# Patient Record
Sex: Female | Born: 1940 | Race: White | Hispanic: No | Smoking: Former smoker
Health system: Southern US, Community
[De-identification: ages and names within clinical notes are randomized; demographics above are authoritative.]

## PROBLEM LIST (undated history)

## (undated) DIAGNOSIS — I219 Acute myocardial infarction, unspecified: Secondary | ICD-10-CM

## (undated) DIAGNOSIS — E039 Hypothyroidism, unspecified: Secondary | ICD-10-CM

## (undated) DIAGNOSIS — J189 Pneumonia, unspecified organism: Secondary | ICD-10-CM

## (undated) DIAGNOSIS — I469 Cardiac arrest, cause unspecified: Secondary | ICD-10-CM

## (undated) DIAGNOSIS — J449 Chronic obstructive pulmonary disease, unspecified: Secondary | ICD-10-CM

## (undated) DIAGNOSIS — E119 Type 2 diabetes mellitus without complications: Secondary | ICD-10-CM

## (undated) DIAGNOSIS — K219 Gastro-esophageal reflux disease without esophagitis: Secondary | ICD-10-CM

## (undated) DIAGNOSIS — Z9981 Dependence on supplemental oxygen: Secondary | ICD-10-CM

## (undated) DIAGNOSIS — R0602 Shortness of breath: Secondary | ICD-10-CM

## (undated) DIAGNOSIS — K529 Noninfective gastroenteritis and colitis, unspecified: Secondary | ICD-10-CM

## (undated) DIAGNOSIS — I82409 Acute embolism and thrombosis of unspecified deep veins of unspecified lower extremity: Secondary | ICD-10-CM

## (undated) DIAGNOSIS — D649 Anemia, unspecified: Secondary | ICD-10-CM

## (undated) DIAGNOSIS — E7800 Pure hypercholesterolemia, unspecified: Secondary | ICD-10-CM

## (undated) DIAGNOSIS — I739 Peripheral vascular disease, unspecified: Secondary | ICD-10-CM

## (undated) DIAGNOSIS — I2510 Atherosclerotic heart disease of native coronary artery without angina pectoris: Secondary | ICD-10-CM

## (undated) DIAGNOSIS — I509 Heart failure, unspecified: Secondary | ICD-10-CM

## (undated) DIAGNOSIS — Z86711 Personal history of pulmonary embolism: Secondary | ICD-10-CM

## (undated) DIAGNOSIS — I70229 Atherosclerosis of native arteries of extremities with rest pain, unspecified extremity: Secondary | ICD-10-CM

## (undated) DIAGNOSIS — Z9289 Personal history of other medical treatment: Secondary | ICD-10-CM

## (undated) DIAGNOSIS — I10 Essential (primary) hypertension: Secondary | ICD-10-CM

## (undated) DIAGNOSIS — K922 Gastrointestinal hemorrhage, unspecified: Secondary | ICD-10-CM

## (undated) DIAGNOSIS — I998 Other disorder of circulatory system: Secondary | ICD-10-CM

## (undated) DIAGNOSIS — Z992 Dependence on renal dialysis: Secondary | ICD-10-CM

## (undated) DIAGNOSIS — N186 End stage renal disease: Secondary | ICD-10-CM

## (undated) HISTORY — DX: Hypothyroidism, unspecified: E03.9

## (undated) HISTORY — DX: Personal history of pulmonary embolism: Z86.711

## (undated) HISTORY — DX: Atherosclerosis of native arteries of extremities with rest pain, unspecified extremity: I70.229

## (undated) HISTORY — DX: Other disorder of circulatory system: I99.8

## (undated) HISTORY — DX: Noninfective gastroenteritis and colitis, unspecified: K52.9

## (undated) HISTORY — PX: TOE AMPUTATION: SHX809

## (undated) HISTORY — PX: AV FISTULA PLACEMENT: SHX1204

## (undated) HISTORY — PX: TUBAL LIGATION: SHX77

## (undated) HISTORY — PX: TONSILLECTOMY: SUR1361

## (undated) HISTORY — PX: INSERTION OF DIALYSIS CATHETER: SHX1324

---

## 2006-11-03 DIAGNOSIS — I82409 Acute embolism and thrombosis of unspecified deep veins of unspecified lower extremity: Secondary | ICD-10-CM

## 2006-11-03 HISTORY — DX: Acute embolism and thrombosis of unspecified deep veins of unspecified lower extremity: I82.409

## 2007-07-22 ENCOUNTER — Inpatient Hospital Stay (HOSPITAL_COMMUNITY): Admission: EM | Admit: 2007-07-22 | Discharge: 2007-07-31 | Payer: Self-pay | Admitting: Emergency Medicine

## 2007-07-26 ENCOUNTER — Ambulatory Visit: Payer: Self-pay | Admitting: *Deleted

## 2011-03-18 NOTE — Op Note (Signed)
[name] [name], [name] [name]                   ACCOUNT NO.:  [id number]   MEDICAL RECORD NO.:  [id number]          PATIENT TYPE:  INP   LOCATION:  6707                         FACILITY:  [facility]   PHYSICIAN:  Juleen China IV, MDDATE OF BIRTH:  1941/10/20   DATE OF PROCEDURE:  07/29/2007  DATE OF DISCHARGE:                               OPERATIVE REPORT   SURGEON:  1. Charlena Cross, M.D.   PREOPERATIVE DIAGNOSIS:  Right leg wound.   POSTOPERATIVE DIAGNOSIS:  Right leg wound.   PROCEDURES PERFORMED:  1. Abdominal aortogram.  2. Bilateral lower extremity runoff.  3. Catheter in aorta x1.   INDICATIONS FOR PROCEDURE:  This is a 70 year old female who has  recently undergone right foot amputation.  She comes in with diminished  ankle/brachial indices.  She is to be evaluated for issues, set up for  an arteriogram with the possibility of an intervention.   PROCEDURE:  After obtaining informed consent, the patient was taken to  room 7.  She is placed supine on the table.  Bilateral groins are  prepped and draped in a standard, sterile fashion.  The left groin was  anesthetized with 1% lidocaine.  Using ultrasound, the left common  femoral artery was accessed with a micropuncture needle.  An 0.018 wire  was advanced in a retrograde fashion into the abdominal aorta under  fluoroscopic visualization.  Next, a micropuncture sheath was placed.  The 0.018 wire and introducer were removed and an 0.035 Wholey wire was  advanced into the abdominal aorta under fluoroscopic visualization.  Next, a 5-French sheath was placed.  Over the wire, an Omni-Flush  catheter was placed to the level of L1 and then an abdominal aortogram  was obtained.  Next, the catheter was pulled down to the aortic  bifurcation and bilateral lower extremity runoff was obtained.   FINDINGS:  Aortogram:  The visualized portions of the suprarenal  abdominal aorta show minimal disease.  There is approximately 40%  stenosis at  the origin of the left renal artery.  The right renal artery  is occluded 1 cm distal to its origin.  The splenic artery is calcified.  The hepatic artery is visualized and is patent, as is the superior  mesenteric artery.  The infrarenal abdominal aorta is irregular.  There  is significant calcification.  It is diffusely disease.  The bilateral  common iliac arteries are also diffusely diseased. There is a high grade  stenosis within the distal right common iliac artery as well as the mid  portion of the left common iliac artery.   Right lower extremity:  The external iliac is diffusely diseased with  long segment stenosis.  The right hypogastric artery is patent.  The  right common femoral artery is patent with no hemodynamically  significant stenoses.  The right superficial femoral artery is patent  but very diminutive in size.  There are areas of stenosis within the  adductor hiatus.  The popliteal artery is diminutive in size but patent.  The patient has an anterior tibial and perineal artery which are patent  down to the level of the ankle.  The posterior tibial reconstitutes its  diminutive vessel at the level of the ankle and crosses the ankle along  with the anterior tibial.   Left lower extremity:  The left external iliac is highly diseased.  There is an area of high grade stenosis in its proximal extent.  The  profunda femoris artery has multiple branches and is patent.  The  superficial femoral artery is occluded with reconstitution into the  distal superficial femoral artery.  The popliteal artery is patent down  across the knee.  The anterior tibial and perineal artery provide the  dominant runoff to the foot.  At the level of the ankle, there is  minimal contrast visualized, therefore, accurate determination of the  disease here is not possible.   At this point in time, the diameter of the right external iliac artery  was approximately 3.5 mm.  At this time, I did not  feel a percutaneous  intervention would provide a durable result and, therefore, elected to  terminate the procedure and discussed with the patient her options of  open surgery versus percutaneous procedure with the possibility that  this may not be very durable.   Sheath was then removed and pressure was held until hemostasis was  achieved.   IMPRESSION:  1. Occluded right renal artery.  2. Diffusely diseased infrarenal abdominal aorta.  3. 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  The diameter      of the external iliac artery measured 3.5 mm.  4. Diffuse disease within the right superficial femoral artery.  The      anterior tibial and perineal artery are the dominant runoff      vessels.  5. High grade left common iliac  and external iliac stenoses.  6. Occluded left superficial femoral artery.  7. Anterior tibial and perineal are dominant runoff vessels to the      left leg.  The left foot is poorly visualized.      Jorge Ny, MD  Electronically Signed     VWB/MEDQ  D:  07/29/2007  T:  07/30/2007  Job:  [id number]

## 2011-03-18 NOTE — Assessment & Plan Note (Signed)
OFFICE VISIT   [name] [name], HADLEY  DOB:  1940/12/15                                       11/18/2007  EAVWU#:[id number]   I spoke with the patient by phone today to again stress to her that she  needs to follow up with me regarding her peripheral vascular disease and  right foot problems.   I stressed to her that she does have an appointment to see me on November 25, 2007.  I have asked the office to call her again just to verify this  appointment and stress with her to make sure that she keeps it.   Balinda Quails, M.D.  Electronically Signed   PGH/MEDQ  D:  11/18/2007  T:  11/19/2007  Job:  [id number]

## 2011-03-18 NOTE — H&P (Signed)
NAMEDANELIA, SNODGRASS NO.:  [id number]   MEDICAL RECORD NO.:  [id number]          PATIENT TYPE:  EMS   LOCATION:  MAJO                         FACILITY:  [facility]   PHYSICIAN:  Altha Harm, MDDATE OF BIRTH:  Feb 09, 1941   DATE OF ADMISSION:  07/22/2007  DATE OF DISCHARGE:                              HISTORY & PHYSICAL   CHIEF COMPLAINT:  Draining wound on right lower extremity.   HISTORY OF PRESENT ILLNESS:  This is a 70 year old lady with a history  of diabetes type 2 who approximately 3 months had an attack of gout  which was treated by her primary care physician.  According to the  patient, approximately 1 week after the gout was treated she noticed the  appearance of a wound on the right lower extremity at the fifth digit.  She stated that she cleaned the wound with peroxide, and approximately 2  months ago the wound began draining purulent contents.  The patient had  been living with her son and did not seek any medical attention  regarding the wound.  However, she came to stay with her daughter in  [place], and upon her daughter seeing the wound, she made an  appointment with her primary care physician who then referred the  patient to podiatrist.  Podiatry called this morning saying that the  patient definitely had osteomyelitis and that there had been erosion of  the metatarsophalangeal area of the right fifth digit.  Given the  patient's history of diabetes and the condition of the wound, she is  admitted for treatment of osteomyelitis.  The patient denies any fever  or chills.  She denies any nausea, vomiting, or diarrhea, and she denies  any trauma to the foot which may have precipitated the appearance of  this wound.   PAST MEDICAL HISTORY:  Significant for:  1. Diabetes type 2.  2. Hypertension.  3. Gout.  4. Hypothyroidism.   FAMILY HISTORY:  Significant for diabetes.   SOCIAL HISTORY:  The patient lives in [place].  Her son  resides  with her.  She smokes a half a pack per day x44 years of tobacco.  She  denies any alcohol or drug use.   CURRENT MEDICATIONS:  Include the following:  1. Plavix 75 mg p.o. daily.  2. Clonidine 0.3 mg p.o. t.i.d.  3. Toprol-XL 100 mg p.o. q.h.s.  4. Lipitor 40 mg p.o. q.h.s.  5. Azor 5/20 mg p.o. daily.  6. Levothyroxine 25 mcg p.o. daily.  7. Metformin 1000 mg p.o. b.i.d.  8. Amaryl 2 mg p.o. daily.   Please note the patient has also been prescribed aldactone 50/50 one-  half tab on a daily basis; however, she states she has not been taking  that in the past week.   REVIEW OF SYSTEMS:  Twelve systems are reviewed, all systems are  negative except as noted in the HPI.   PHYSICAL EXAMINATION:  VITAL SIGNS:  On my examination, the patient has  a blood pressure of 164/80, a temperature orally is 98.1, heart rate of  100,  respiratory rate of 16, sats of 97% on room air.  GENERAL:  The patient is well appearing, resting comfortably with no  acute distress.  HEENT EXAMINATION:  She is normocephalic, atraumatic.  Pupils are  equally round and reactive to light and accommodation.  Fundi are  benign.  Extraocular movements are intact.  There is no icterus or  conjunctival pallor.  Oropharynx is moist.  No exudate, erythema,  lesions noted.  Patient is edentulous.  NECK EXAMINATION:  Trachea is midline.  No masses, no thyromegaly, no  JVD, no carotid bruit.  RESPIRATORY EXAMINATION:  The patient has normal respiratory effort,  equal excursion bilaterally.  She has got no wheezing or rhonchi noted,  and she is to clear to auscultation.  There is no increased vocal  fremitus.  CARDIOVASCULAR:  She has got a normal S1 and S2.  There are no murmurs,  rubs, or gallops noted.  PMI is nondisplaced.  No heaves or thrills on  palpation.  ABDOMINAL EXAMINATION:  Abdomen is obese, soft and nontender,  nondistended.  No masses, no hepatosplenomegaly is noted.  LYMPH NODES:  She has got  no cervical, axillary, inguinal  lymphadenopathy noted.  MUSCULOSKELETAL:  Patient has no warmth, swelling, or erythema around  the joints except for the right metatarsophalangeal joint of the fifth  digit where the patient has approximately a 1 x 1.5-cm wound which  appears to be tunneling and which has drainage from it.  She has  erythema and swelling surrounding the wound.  NEUROLOGICAL:  The patient has no focal neurological deficits.  Cranial  nerves II through XII are grossly intact.  Strength is 5/5 bilaterally  upper and lower extremities.  DTRs are 2+ bilateral upper and lower  extremities.  Sensation to light touch appear intact to proprioception.  Please note that microfilament test done on the bilateral lower  extremities, she has some diminished sensation in bilateral lower  extremities on microfilament test.  PSYCHIATRIC:  She is alert and oriented x3.  She has good insight and  cognition.  She has got good recent and remote recall.   ASSESSMENT:  This is a patient who presents with:  1. Osteomyelitis of the right foot.  2. Diabetes type 2.  3. Hypertension.  4. A history of hypothyroidism.  5. A history of gout in the past.   PLAN:  The patient will be admitted.  An MRI will be done to further  evaluate the right lower extremity.  I will start the patient on  vancomycin and Zosyn and get blood cultures of the lower extremity.  At  this time, I will leave it to the discretion of the treating physician  to determine whether or not they need input from podiatry for any  further treatment once the results of the MRI are received.  The patient  will receive wound care with wet-to-dry dressings to the right lower  extremity.  The patient will be continued on her usual medication, and  we will check the patient's blood sugars a.c. and h.s., and we will also  check a hemoglobin Alc to evaluate for blood sugar control at home.      Altha Harm, MD  Electronically  Signed     MAM/MEDQ  D:  07/22/2007  T:  07/23/2007  Job:  [id number]   cc:   Lacretia Leigh. Quintella Reichert, M.D.

## 2011-03-18 NOTE — Consult Note (Signed)
NAMESAFIA, PANZER                   ACCOUNT NO.:  [id number]   MEDICAL RECORD NO.:  [id number]          PATIENT TYPE:  INP   LOCATION:  6707                         FACILITY:  [facility]   PHYSICIAN:  Balinda Quails, M.D.    DATE OF BIRTH:  November 16, 1940   DATE OF CONSULTATION:  07/26/2007  DATE OF DISCHARGE:                                 CONSULTATION   REFERRING PHYSICIAN:  Alvan Dame, D.P.M.   REASON FOR CONSULTATION:  1. Peripheral vascular disease.  2. Osteomyelitis, right 5th toe, status post right 5th toe amputation.   HISTORY:  [name] [name] is a 70 year old female who describes an attack of  gout occurring approximately 3 months ago affecting her right 5th toe.  Following the acute attack, she noticed a wound draining from the 5th  digit of her right lower extremity.  This was treated with local wound  care.  However, purulent drainage developed.  The patient was referred  to Dr. Ralene Cork, who evaluated [name] [name] with appropriate x, rays  revealing evidence of osteomyelitis.   She was admitted to [facility] on July 22, 2007 and  underwent this right 5th toe amputation and proximal metatarsal head.   Since admission to the hospital, she has undergone further workup with  lower extremity Dopplers.  These reveal ankle-brachial indices  bilaterally of 0.5.  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   PAST MEDICAL HISTORY:  1. Type 2 diabetes.  2. Tobacco abuse.  3. Hypertension.  4. Hypothyroidism.  5. Gout.  6. Hyperlipidemia.   FAMILY HISTORY:  This is significant for peripheral vascular disease and  diabetes.   SOCIAL HISTORY:  The patient lives in the [facility] and resides  with her son currently.  She is a widow.  She has 2 sons and 2  grandchildren.  She smokes a half a pack of cigarettes daily, but has  smoked more heavily in the past.  She denies any alcohol use.   MEDICATIONS:  1. Lovenox 40 mg subcu daily.  2. Protonix 40  mg p.o. daily.  3. Catapres 0.3 mg p.o. t.i.d.  4. Toprol-XL 100 mg p.o. daily.  5. Plavix 75 mg p.o. daily.  6. Lipitor 40 mg p.o. daily.  7. Synthroid 25 mcg p.o. daily.  8. Glucophage 1000 mg p.o. b.i.d.  9. Amaryl 2 mg p.o. daily.  10.Norvasc 5 mg p.o. daily.  11.Vancomycin IV per pharmacy protocol.  12.Avapro 150 mg p.o. daily.  13.Insulin sliding scale.  14.Zosyn 3.375 mg IV q.6 h.   ALLERGIES:  None known.   REVIEW OF SYSTEMS:  The patient notes bilateral claudication for  approximately 5 years.  This is primarily calf claudication.  She denies  rest pain or ischemic ulceration.  She has had no chest pain.  Mild  shortness of breath with exertion.  She denies any change in bowel  habits.  Her weight is good.  Appetite good.  No dysuria or frequency.   PHYSICAL EXAMINATION:  GENERAL:  A 70 year old female appears  approximately her stated age, alert and oriented.  VITAL SIGNS:  BP is 153/78, pulse was 86 per minute, respirations are 20  per minute, temperature 98.2 Fahrenheit.  ABDOMEN:  Soft and nontender.  No masses or organomegaly.  Normal bowel  sounds.  LOWER EXTREMITIES:  Femoral pulse 1+ on the right, absent on the left.  No popliteal, posterior tibial or dorsalis pedis pulses bilaterally.  The right foot is bandaged.   IMPRESSION:  1. Moderately severe peripheral vascular disease, bilateral lower      extremities, with multilevel vascular occlusive disease with ankle-      brachial indices 0.5.  2. History of gout with osteomyelitis of right 5th toe, status post      amputation.  3. Type 2 diabetes.  4. Hypertension.  5. Hyperlipidemia.  6. Hypothyroidism.  7. Gout.   RECOMMENDATIONS:  The patient will be scheduled for a diagnostic  arteriogram of the lower extremities with possible intervention for  treatment of advanced peripheral vascular disease.      Balinda Quails, M.D.  Electronically Signed     PGH/MEDQ  D:  07/26/2007  T:  07/27/2007  Job:   [id number]

## 2011-03-18 NOTE — Op Note (Signed)
NAMECLAUDENE, [name] [name]                   ACCOUNT NO.:  [id number]   MEDICAL RECORD NO.:  [id number]          PATIENT TYPE:  INP   LOCATION:  6707                         FACILITY:  [facility]   PHYSICIAN:  Alvan Dame, D.P.M. DATE OF BIRTH:  [birth year]   DATE OF PROCEDURE:  07/24/2007  DATE OF DISCHARGE:                               OPERATIVE REPORT   SURGEON:  Alvan Dame, D.P.M.   PREOPERATIVE DIAGNOSIS:  Ulcer with osteomyelitis, 5th toe proximal  phalanx and distal metatarsal head, right foot.   POSTOPERATIVE DIAGNOSIS:  Ulcer with osteomyelitis, 5th toe proximal  phalanx and distal metatarsal head, right foot.   OPERATIVE PROCEDURE:  Amputation of 5th toe and proximal metatarsal  head, 5th, right.   ANESTHESIA:  MAC or managed anesthesia care, with local anesthetic  administered, a total of 15 mL 2% Xylocaine plain and 15 mL 0.5%  Marcaine plain in a PT and ankle block fashion.   HEMOSTASIS:  Use of right ankle tourniquet at 250 mmHg.   INDICATIONS FOR SURGERY:  The patient has approximately a 74-month  history of ulceration.  X-rays and MRI have confirmed osteomyelitis  extending to the metatarsal head, as well as proximal phalanx of  5th  digit.  The patient does have severe diabetes with completing factors of  angiopathy and neuropathy, based on history.  The patient is currently  on antibiotics; however, appropriate debridement and amputation of  infected bone are recommended at this time.  There are no  contraindications, so we proceeded as scheduled.   FINDINGS AND PROCEDURE:  The patient was brought into the OR and placed  on the table in the supine position.  IV sedation was established.  A  local anesthetic was administered.  The foot was prepped and draped in  the usual aseptic manner and the following procedures were then carried  out.   AMPUTATION OF 5TH TOE AND DISTAL 5TH METATARSAL, RIGHT FOOT:  Attention  was now directed to the dorsolateral aspect of the  right foot, where  approximately a 1-cm ulceration was identified over the lateral 5th  metatarsal.  Bone was exposed within the ulcer site, and at this time,  utilizing a tennis-racket like incision, incision was made over the  dorsolateral surface of the 5th metatarsal, extending from the base of  the 5th metatarsal distal to the base of the 5th toe.  The 5th toe was  incised circumferentially.  At this time, dissection was deepened, with  hemostasis being acquired as necessary, vessels being ligated and  careful dissection of the capsule of the 5th MTP joint, which was kept  intact.  At this time, utilizing power instrumentation, a window at the  midshaft of the 5th metatarsal was made and a through-and-through  osteotomy was made from dorsal proximal to plantar distal in  orientation, and the distal aspect of the metatarsal, as well as the  entire 5th digit were resected from the site in toto and submitted for  pathology.  At this time, the remaining site was cultured for both  aerobic and anaerobic growth, and the tissue  submitted for pathology  analysis.  At this time, using power and pulsed lavage, 1000 mL of  saline was utilized to cleanse the area thoroughly.  Additional cautery  was performed for any small remaining bleeders.  At this time, the  capsule over the 5th metatarsal stump site was closed with 3-0 Vicryl,  and a combination of 4-0 nylon and 2-0 nylon were utilized to close the  skin over a TLS drain.  The TLS drain was noted to be active and  functioning upon closure.  At this time, Betadine, Adaptic and a dry  sterile compressive dressing were applied to the right foot, and a TLS  drain was activated.   On completion of the procedure, the tourniquet was deflated with  immediate return of perfusion to all digits being noted.  I should note,  the digits are somewhat rubrous.  This is consistent with her  preoperative as well, with diabetic vascular disease being noted.   At  this time, the patient returned to recovery and will be discharged to  her room per Anesthesia.  Return to all preoperative medications.  I  will prescribe Vicodin for pain.  The patient will be nonweightbearing  for a 24-hour period.  The drain will be left in for at leas 24 to 72  hours.  Minimal bleeding was identified and the drain was functioning,  although at a very slow rate.  I should note from a vascular standpoint,  the patient does have some likely compromise.  This patient will be  followed appropriately for at least the next several days until hopeful  release to home postop.  Still pending are her cultures and vascular  consult.           ______________________________  Alvan Dame, D.P.M.     RS/MEDQ  D:  07/24/2007  T:  07/24/2007  Job:  [id number]

## 2011-03-18 NOTE — Discharge Summary (Signed)
[name] [name], [name] [name]                   ACCOUNT NO.:  [id number]   MEDICAL RECORD NO.:  [id number]          PATIENT TYPE:  INP   LOCATION:  6707                         FACILITY:  [facility]   PHYSICIAN:  Lonia Blood, M.D.      DATE OF BIRTH:  [birth year]   DATE OF ADMISSION:  07/22/2007  DATE OF DISCHARGE:  07/31/2007                               DISCHARGE SUMMARY   ADDENDUM:   PRIMARY CARE PHYSICIAN:  Dr. Quintella Reichert.   VASCULAR SURGERY:  Dr. Liliane Bade.   PODIATRY:  Dr. Alvan Dame, D.P.M.   Please refer to dictated discharge summary by Dr. Marcellus Scott on  September 23.   ADDITIONAL PROCEDURES PERFORMED:  Include abdominal aortogram, bilateral  lower extremity runoff and aortic catheterization by vascular surgery.  Findings showed occluded right renal artery, diffusely diseased  infrarenal abdominal aorta, high-grade stenosis within the right common  iliac artery and diffuse disease in the right axillary iliac artery with  the diameter of the right axillary iliac artery measured at 3.5 mm.  Also, diffuse disease within the right superficial femoral artery,  anterior tibial and peroneal artery of the dominant runoff, high-grade  left common iliac and axial iliac stenosis, occluded left superficial  femoral artery, anterior tibial and perineal dominant runoff vessels in  the left leg, with poorly visualized left foot.  Recommendation for her  Dr. Liliane Bade.  The patient will need multiple bypass surgeries.  She  will, however, need a cardiac evaluation as an outpatient prior to the  surgery.   DISPOSITION:  The patient will be discharged home to follow up with Dr.  Liliane Bade and Dr. Quintella Reichert, her primary care physician.  She should get  some cardiac preoperative cardiac evaluation as an outpatient, prior to  her surgery.  She will also follow with Dr. Ralene Cork for her wound care.   DISCHARGE MEDICATIONS:  Include:  1. Norvasc 10 mg daily.  2. Lipitor 40 mg q.h.s.  3. Clonidine 0.3  mg t.i.d.  4. Toprol XL 150 mg daily.  5. Plavix 75 mg daily.  6. Amaryl 2 mg daily.  7. Azor 520 1 tablet daily.  8. Levothyroxine 25 mcg daily.  9. Keflex 500 mg p.o. t.i.d. for 2 weeks.  10.Ciprofloxacin 500 mg p.o. t.i.d. for 2 weeks.  The patient is      currently stable as indicated and will proceed with a discharge.      Lonia Blood, M.D.  Electronically Signed     LG/MEDQ  D:  07/31/2007  T:  08/01/2007  Job:  [id number]   cc:   Quentin Mulling, MD  Alvan Dame, D.P.M.  Balinda Quails, M.D.

## 2011-03-18 NOTE — Discharge Summary (Signed)
[name], [name]                   ACCOUNT NO.:  [id number]   MEDICAL RECORD NO.:  [id number]          PATIENT TYPE:  INP   LOCATION:  6707                         FACILITY:  [facility]   PHYSICIAN:  Marcellus Scott, MD     DATE OF BIRTH:  1941-04-14   DATE OF ADMISSION:  07/22/2007  DATE OF DISCHARGE:                               DISCHARGE SUMMARY   INTERIM DISCHARGE SUMMARY:   DATE OF DISCHARGE:  To be determined.   PRIMARY MEDICAL DOCTOR:  Dr. Quintella Reichert.   DISCHARGE DIAGNOSES:  1. 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  2. Severe peripheral vascular disease.  3. Diarrhea.  4. Hypertension.  5. Diabetes.  6. Anemia.  7. Hypothyroidism.   DISCHARGE MEDICATIONS:  To be dictated at discharge.   PROCEDURES:  1. X-ray of the right foot on September 20.  Impression:  Unremarkable      postoperative changes.  No acute bony abnormality.  2. MRI of the right foot with and without contrast on September 18.      Impression:  (1) osteomyelitis of the distal fifth metatarsal and      of the base of the proximal phalanx of the fifth toe.  There is      surrounding soft tissue edema and enhancement, extending in the      adjacent plantar interosseous muscle in the flexor opponens digiti      minimi muscles.  (2) Low-level edema in the sesamoid bones of the      great toe, compatible with mild sesamoiditis.   PERTINENT LAB RESULTS:  Wound culture taken intraop:  No growth.  Hemoglobin A1c of 6.  Rest of the labs to be dictated at discharge.   CONSULTATIONS:  1. Podiatry, Alvan Dame, D.P.M.  2. VVS from P. Liliane Bade, M.D.   HOSPITAL COURSE AND PATIENT DISPOSITION:  For details of initial  admission, please refer to the history and physical note.  In summary,  [name] is a pleasant 70 year old female patient with history of type  2 diabetes, hypertension, hyperlipidemia, who noted a wound on the right  foot on the lateral  aspect of the little toe weeks ago, which she had  been managing at home with cleaning with hydrogen peroxide.  Approximately 2 months ago the wound started draining. She did not seek  any medical attention.  The patient, however, went to see the podiatrist  on the day of admission, who suspected osteomyelitis and requested  admission to the hospital for IV antibiotics and surgical intervention.  The patient was admitted to the medical floor directly.  Wound cultures  were already taken off and sent from the podiatrist's office.  The  patient was placed on empiric IV Zosyn and vancomycin, and she was  continued on her home hypoglycemic and antihypertensive agents.  Arterial Dopplers of the lower extremities were done because of very  poor pulsations or absent pulsations in both dorsalis pedis and  posterior tibials.  The patient was subsequently taken to the  operating  room and had amputation of the right fifth metatarsal and proximal  phalanx.  The patient tolerated the procedure well.  Subsequently a  vascular consult has been done to assess the arterial supply in the  lower extremity because of the risk of poor healing with lack of  vascular supply.  The patient is assessed to have moderately severe  peripheral vascular disease, bilateral lower extremities, with  multilevel vascular occlusive disease. The patient is scheduled for an  arteriogram plus or minus PTA/stent for September 24.  Further course  will be dictated based on the outcome of that procedure.  The patient  subsequently may go home on oral antibiotics depending on the culture  results from the podiatrist's office.      Marcellus Scott, MD  Electronically Signed     AH/MEDQ  D:  07/27/2007  T:  07/28/2007  Job:  [id number]   cc:   Lacretia Leigh. Quintella Reichert, M.D.  Alvan Dame, D.P.M.  Balinda Quails, M.D.

## 2011-06-12 DIAGNOSIS — I739 Peripheral vascular disease, unspecified: Secondary | ICD-10-CM

## 2011-06-12 DIAGNOSIS — E039 Hypothyroidism, unspecified: Secondary | ICD-10-CM

## 2011-06-12 DIAGNOSIS — E7800 Pure hypercholesterolemia, unspecified: Secondary | ICD-10-CM

## 2011-06-12 DIAGNOSIS — B351 Tinea unguium: Secondary | ICD-10-CM

## 2011-08-14 LAB — CBC
HCT: 32.8 — ABNORMAL LOW
HCT: 35.7 — ABNORMAL LOW
Hemoglobin: 11.7 — ABNORMAL LOW
Hemoglobin: 14.9
MCHC: 32.9
MCV: 81.1
Platelets: 319
Platelets: 335
RBC: 4.42
RDW: 13.7
RDW: 15.1 — ABNORMAL HIGH
RDW: 15.2 — ABNORMAL HIGH
WBC: 8.3

## 2011-08-14 LAB — WOUND CULTURE

## 2011-08-14 LAB — DIFFERENTIAL
Basophils Absolute: 0.1
Lymphocytes Relative: 13
Lymphs Abs: 1.1
Monocytes Absolute: 0.5
Neutro Abs: 6.5

## 2011-08-14 LAB — CLOSTRIDIUM DIFFICILE EIA: C difficile Toxins A+B, EIA: NEGATIVE

## 2011-08-14 LAB — BASIC METABOLIC PANEL
BUN: 12
BUN: 9
CO2: 26
Calcium: 8.7
Calcium: 9.4
Chloride: 97
Chloride: 99
Creatinine, Ser: 1.04
GFR calc non Af Amer: 53 — ABNORMAL LOW
GFR calc non Af Amer: >60
Glucose, Bld: 100 — ABNORMAL HIGH
Glucose, Bld: 129 — ABNORMAL HIGH
Potassium: 3.9
Sodium: 139

## 2011-08-14 LAB — CULTURE, BLOOD (ROUTINE X 2)
Culture: NO GROWTH
Culture: NO GROWTH

## 2011-08-14 LAB — ANAEROBIC CULTURE

## 2011-08-14 LAB — APTT: aPTT: 33

## 2011-10-04 DIAGNOSIS — K529 Noninfective gastroenteritis and colitis, unspecified: Secondary | ICD-10-CM

## 2011-10-04 HISTORY — DX: Noninfective gastroenteritis and colitis, unspecified: K52.9

## 2011-10-11 ENCOUNTER — Inpatient Hospital Stay (HOSPITAL_COMMUNITY): Payer: Medicare Other

## 2011-10-11 ENCOUNTER — Inpatient Hospital Stay (HOSPITAL_COMMUNITY)
Admission: AD | Admit: 2011-10-11 | Discharge: 2011-11-01 | DRG: 673 | Disposition: A | Discharge status: Skilled Nursing Facility | Payer: Medicare Other | Source: Other Acute Inpatient Hospital | Attending: Internal Medicine | Admitting: Internal Medicine

## 2011-10-11 ENCOUNTER — Encounter (HOSPITAL_COMMUNITY): Payer: Self-pay | Admitting: *Deleted

## 2011-10-11 DIAGNOSIS — D631 Anemia in chronic kidney disease: Secondary | ICD-10-CM | POA: Diagnosis present

## 2011-10-11 DIAGNOSIS — E876 Hypokalemia: Secondary | ICD-10-CM | POA: Diagnosis not present

## 2011-10-11 DIAGNOSIS — I120 Hypertensive chronic kidney disease with stage 5 chronic kidney disease or end stage renal disease: Principal | ICD-10-CM | POA: Diagnosis present

## 2011-10-11 DIAGNOSIS — N2581 Secondary hyperparathyroidism of renal origin: Secondary | ICD-10-CM | POA: Diagnosis present

## 2011-10-11 DIAGNOSIS — E039 Hypothyroidism, unspecified: Secondary | ICD-10-CM | POA: Diagnosis present

## 2011-10-11 DIAGNOSIS — E119 Type 2 diabetes mellitus without complications: Secondary | ICD-10-CM | POA: Diagnosis present

## 2011-10-11 DIAGNOSIS — Z794 Long term (current) use of insulin: Secondary | ICD-10-CM

## 2011-10-11 DIAGNOSIS — R5381 Other malaise: Secondary | ICD-10-CM | POA: Diagnosis not present

## 2011-10-11 DIAGNOSIS — Z79899 Other long term (current) drug therapy: Secondary | ICD-10-CM

## 2011-10-11 DIAGNOSIS — J810 Acute pulmonary edema: Secondary | ICD-10-CM | POA: Diagnosis present

## 2011-10-11 DIAGNOSIS — A0472 Enterocolitis due to Clostridium difficile, not specified as recurrent: Secondary | ICD-10-CM | POA: Diagnosis not present

## 2011-10-11 DIAGNOSIS — E7800 Pure hypercholesterolemia, unspecified: Secondary | ICD-10-CM | POA: Diagnosis present

## 2011-10-11 DIAGNOSIS — J90 Pleural effusion, not elsewhere classified: Secondary | ICD-10-CM | POA: Diagnosis present

## 2011-10-11 DIAGNOSIS — I10 Essential (primary) hypertension: Secondary | ICD-10-CM | POA: Diagnosis present

## 2011-10-11 DIAGNOSIS — J9600 Acute respiratory failure, unspecified whether with hypoxia or hypercapnia: Secondary | ICD-10-CM | POA: Diagnosis present

## 2011-10-11 DIAGNOSIS — J9819 Other pulmonary collapse: Secondary | ICD-10-CM | POA: Diagnosis present

## 2011-10-11 DIAGNOSIS — E875 Hyperkalemia: Secondary | ICD-10-CM | POA: Diagnosis present

## 2011-10-11 DIAGNOSIS — N185 Chronic kidney disease, stage 5: Secondary | ICD-10-CM

## 2011-10-11 DIAGNOSIS — N179 Acute kidney failure, unspecified: Secondary | ICD-10-CM | POA: Diagnosis present

## 2011-10-11 DIAGNOSIS — I824Z9 Acute embolism and thrombosis of unspecified deep veins of unspecified distal lower extremity: Secondary | ICD-10-CM | POA: Diagnosis not present

## 2011-10-11 DIAGNOSIS — E669 Obesity, unspecified: Secondary | ICD-10-CM | POA: Diagnosis present

## 2011-10-11 DIAGNOSIS — Z7982 Long term (current) use of aspirin: Secondary | ICD-10-CM

## 2011-10-11 DIAGNOSIS — R748 Abnormal levels of other serum enzymes: Secondary | ICD-10-CM | POA: Diagnosis not present

## 2011-10-11 DIAGNOSIS — S98139A Complete traumatic amputation of one unspecified lesser toe, initial encounter: Secondary | ICD-10-CM

## 2011-10-11 DIAGNOSIS — K219 Gastro-esophageal reflux disease without esophagitis: Secondary | ICD-10-CM | POA: Diagnosis present

## 2011-10-11 DIAGNOSIS — I739 Peripheral vascular disease, unspecified: Secondary | ICD-10-CM | POA: Diagnosis present

## 2011-10-11 DIAGNOSIS — D649 Anemia, unspecified: Secondary | ICD-10-CM | POA: Diagnosis present

## 2011-10-11 DIAGNOSIS — A498 Other bacterial infections of unspecified site: Secondary | ICD-10-CM | POA: Diagnosis present

## 2011-10-11 DIAGNOSIS — N186 End stage renal disease: Secondary | ICD-10-CM | POA: Diagnosis not present

## 2011-10-11 DIAGNOSIS — N390 Urinary tract infection, site not specified: Secondary | ICD-10-CM | POA: Diagnosis present

## 2011-10-11 DIAGNOSIS — J441 Chronic obstructive pulmonary disease with (acute) exacerbation: Secondary | ICD-10-CM | POA: Diagnosis present

## 2011-10-11 DIAGNOSIS — Z87891 Personal history of nicotine dependence: Secondary | ICD-10-CM

## 2011-10-11 DIAGNOSIS — I2699 Other pulmonary embolism without acute cor pulmonale: Secondary | ICD-10-CM | POA: Diagnosis not present

## 2011-10-11 HISTORY — DX: Pneumonia, unspecified organism: J18.9

## 2011-10-11 HISTORY — DX: Atherosclerotic heart disease of native coronary artery without angina pectoris: I25.10

## 2011-10-11 HISTORY — DX: Chronic obstructive pulmonary disease, unspecified: J44.9

## 2011-10-11 HISTORY — DX: Peripheral vascular disease, unspecified: I73.9

## 2011-10-11 HISTORY — DX: Acute myocardial infarction, unspecified: I21.9

## 2011-10-11 HISTORY — DX: Gastro-esophageal reflux disease without esophagitis: K21.9

## 2011-10-11 HISTORY — DX: Essential (primary) hypertension: I10

## 2011-10-11 LAB — URINALYSIS, ROUTINE W REFLEX MICROSCOPIC
Bilirubin Urine: NEGATIVE
Ketones, ur: NEGATIVE mg/dL
Nitrite: NEGATIVE
Protein, ur: NEGATIVE mg/dL

## 2011-10-11 LAB — GLUCOSE, CAPILLARY: Glucose-Capillary: 426 mg/dL — ABNORMAL HIGH (ref 70–99)

## 2011-10-11 LAB — MRSA PCR SCREENING: MRSA by PCR: NEGATIVE

## 2011-10-11 LAB — URINE MICROSCOPIC-ADD ON

## 2011-10-11 MED ORDER — ONDANSETRON HCL 4 MG PO TABS
4.0000 mg | ORAL_TABLET | Freq: Four times a day (QID) | ORAL | Status: DC | PRN
  Filled 2011-10-11: qty 1

## 2011-10-11 MED ORDER — ONDANSETRON HCL 4 MG/2ML IJ SOLN
4.0000 mg | Freq: Four times a day (QID) | INTRAMUSCULAR | Status: DC | PRN
  Administered 2011-10-12 – 2011-10-26 (×4): 4 mg via INTRAVENOUS
  Filled 2011-10-11 (×4): qty 2

## 2011-10-11 MED ORDER — INSULIN GLARGINE 100 UNIT/ML ~~LOC~~ SOLN
30.0000 [IU] | SUBCUTANEOUS | Status: DC
  Administered 2011-10-12 – 2011-10-13 (×2): 30 [IU] via SUBCUTANEOUS
  Filled 2011-10-11: qty 3

## 2011-10-11 MED ORDER — OXYCODONE HCL 5 MG PO TABS
5.0000 mg | ORAL_TABLET | ORAL | Status: DC | PRN
  Administered 2011-10-19 – 2011-10-23 (×2): 5 mg via ORAL
  Filled 2011-10-11 (×3): qty 1

## 2011-10-11 MED ORDER — ACETAMINOPHEN 325 MG PO TABS
650.0000 mg | ORAL_TABLET | Freq: Four times a day (QID) | ORAL | Status: DC | PRN
  Administered 2011-11-01: 650 mg via ORAL

## 2011-10-11 MED ORDER — AMLODIPINE BESYLATE 10 MG PO TABS
10.0000 mg | ORAL_TABLET | Freq: Every day | ORAL | Status: DC
  Filled 2011-10-11 (×2): qty 1

## 2011-10-11 MED ORDER — CLOPIDOGREL BISULFATE 75 MG PO TABS
75.0000 mg | ORAL_TABLET | Freq: Every day | ORAL | Status: DC
  Administered 2011-10-12 – 2011-11-01 (×18): 75 mg via ORAL
  Filled 2011-10-11 (×24): qty 1

## 2011-10-11 MED ORDER — ISOSORBIDE MONONITRATE ER 60 MG PO TB24
120.0000 mg | ORAL_TABLET | Freq: Every day | ORAL | Status: DC
  Administered 2011-10-12 – 2011-11-01 (×16): 120 mg via ORAL
  Filled 2011-10-11 (×21): qty 2

## 2011-10-11 MED ORDER — PREDNISONE 20 MG PO TABS
40.0000 mg | ORAL_TABLET | ORAL | Status: DC
  Administered 2011-10-12 – 2011-10-13 (×2): 40 mg via ORAL
  Filled 2011-10-11 (×3): qty 2

## 2011-10-11 MED ORDER — HYDRALAZINE HCL 50 MG PO TABS
50.0000 mg | ORAL_TABLET | Freq: Three times a day (TID) | ORAL | Status: DC
  Administered 2011-10-12 – 2011-10-20 (×14): 50 mg via ORAL
  Filled 2011-10-11 (×30): qty 1

## 2011-10-11 MED ORDER — ASPIRIN 81 MG PO CHEW
81.0000 mg | CHEWABLE_TABLET | Freq: Every day | ORAL | Status: DC
  Administered 2011-10-12 – 2011-11-01 (×16): 81 mg via ORAL
  Filled 2011-10-11 (×18): qty 1

## 2011-10-11 MED ORDER — NITROGLYCERIN 0.4 MG SL SUBL: 0.4000 mg | SUBLINGUAL_TABLET | SUBLINGUAL | Status: DC | PRN

## 2011-10-11 MED ORDER — SODIUM CHLORIDE 0.9 % IV SOLN
250.0000 mL | INTRAVENOUS | Status: DC | PRN
  Administered 2011-10-18: 500 mL via INTRAVENOUS
  Administered 2011-10-19: 250 mL via INTRAVENOUS
  Administered 2011-10-22: 10 mL via INTRAVENOUS
  Administered 2011-10-26: 250 mL via INTRAVENOUS

## 2011-10-11 MED ORDER — DOCUSATE SODIUM 100 MG PO CAPS
100.0000 mg | ORAL_CAPSULE | Freq: Two times a day (BID) | ORAL | Status: DC
  Administered 2011-10-12 – 2011-11-01 (×23): 100 mg via ORAL
  Filled 2011-10-11 (×32): qty 1

## 2011-10-11 MED ORDER — CLONIDINE HCL 0.1 MG PO TABS
0.1000 mg | ORAL_TABLET | Freq: Three times a day (TID) | ORAL | Status: DC
  Administered 2011-10-12 – 2011-10-20 (×16): 0.1 mg via ORAL
  Filled 2011-10-11 (×28): qty 1

## 2011-10-11 MED ORDER — FUROSEMIDE 10 MG/ML IJ SOLN
80.0000 mg | Freq: Once | INTRAMUSCULAR | Status: AC
  Administered 2011-10-12: 80 mg via INTRAVENOUS
  Filled 2011-10-11: qty 8

## 2011-10-11 MED ORDER — SODIUM CHLORIDE 0.9 % IJ SOLN
3.0000 mL | INTRAMUSCULAR | Status: DC | PRN
  Administered 2011-10-12: 03:00:00 via INTRAVENOUS
  Administered 2011-10-13: 3 mL via INTRAVENOUS

## 2011-10-11 MED ORDER — IPRATROPIUM BROMIDE 0.02 % IN SOLN
0.5000 mg | RESPIRATORY_TRACT | Status: DC
  Administered 2011-10-12 – 2011-10-13 (×10): 0.5 mg via RESPIRATORY_TRACT
  Filled 2011-10-11 (×10): qty 2.5

## 2011-10-11 MED ORDER — PANTOPRAZOLE SODIUM 40 MG PO TBEC
40.0000 mg | DELAYED_RELEASE_TABLET | Freq: Every day | ORAL | Status: DC
  Administered 2011-10-12 – 2011-10-14 (×3): 40 mg via ORAL
  Filled 2011-10-11 (×3): qty 1

## 2011-10-11 MED ORDER — INSULIN ASPART 100 UNIT/ML ~~LOC~~ SOLN
8.0000 [IU] | Freq: Once | SUBCUTANEOUS | Status: AC
  Administered 2011-10-12: 8 [IU] via SUBCUTANEOUS

## 2011-10-11 MED ORDER — GUAIFENESIN ER 600 MG PO TB12
600.0000 mg | ORAL_TABLET | Freq: Two times a day (BID) | ORAL | Status: DC
  Administered 2011-10-12 – 2011-11-01 (×35): 600 mg via ORAL
  Filled 2011-10-11 (×43): qty 1

## 2011-10-11 MED ORDER — METOPROLOL TARTRATE 50 MG PO TABS
50.0000 mg | ORAL_TABLET | Freq: Two times a day (BID) | ORAL | Status: DC
  Administered 2011-10-12 – 2011-10-20 (×12): 50 mg via ORAL
  Filled 2011-10-11 (×19): qty 1

## 2011-10-11 MED ORDER — LEVOTHYROXINE SODIUM 25 MCG PO TABS
25.0000 ug | ORAL_TABLET | Freq: Every day | ORAL | Status: DC
  Administered 2011-10-12 – 2011-10-14 (×3): 25 ug via ORAL
  Filled 2011-10-11 (×4): qty 1

## 2011-10-11 MED ORDER — ACETAMINOPHEN 650 MG RE SUPP
650.0000 mg | Freq: Four times a day (QID) | RECTAL | Status: DC | PRN
  Filled 2011-10-11: qty 1

## 2011-10-11 MED ORDER — INSULIN ASPART 100 UNIT/ML ~~LOC~~ SOLN
0.0000 [IU] | Freq: Three times a day (TID) | SUBCUTANEOUS | Status: DC
  Administered 2011-10-12: 15 [IU] via SUBCUTANEOUS
  Filled 2011-10-11: qty 3

## 2011-10-11 MED ORDER — SODIUM CHLORIDE 0.9 % IJ SOLN: 3.0000 mL | Freq: Two times a day (BID) | INTRAMUSCULAR | Status: DC

## 2011-10-11 MED ORDER — MOXIFLOXACIN HCL IN NACL 400 MG/250ML IV SOLN
400.0000 mg | INTRAVENOUS | Status: DC
  Administered 2011-10-12 – 2011-10-13 (×2): 400 mg via INTRAVENOUS
  Filled 2011-10-11 (×3): qty 250

## 2011-10-11 MED ORDER — SODIUM POLYSTYRENE SULFONATE 15 GM/60ML PO SUSP
30.0000 g | Freq: Once | ORAL | Status: AC
  Administered 2011-10-12: 30 g via ORAL
  Filled 2011-10-11: qty 120

## 2011-10-11 MED ORDER — FUROSEMIDE 10 MG/ML IJ SOLN
40.0000 mg | INTRAMUSCULAR | Status: DC
  Administered 2011-10-12: 40 mg via INTRAVENOUS
  Filled 2011-10-11 (×2): qty 4

## 2011-10-11 MED ORDER — SODIUM BICARBONATE 650 MG PO TABS
650.0000 mg | ORAL_TABLET | Freq: Three times a day (TID) | ORAL | Status: DC
  Administered 2011-10-12 – 2011-10-14 (×9): 650 mg via ORAL
  Filled 2011-10-11 (×10): qty 1

## 2011-10-11 MED ORDER — ALBUTEROL SULFATE (5 MG/ML) 0.5% IN NEBU
2.5000 mg | INHALATION_SOLUTION | RESPIRATORY_TRACT | Status: DC
  Administered 2011-10-12 – 2011-10-13 (×10): 2.5 mg via RESPIRATORY_TRACT
  Filled 2011-10-11 (×10): qty 0.5

## 2011-10-11 NOTE — H&P (Signed)
PCP:   Dr Alwyn Ren   Chief Complaint: weakness,shortness of breath in last 2 days.  HPI: Mrs Grunert was transferred from [place] ED today with worsening SOB. She has felt increasingly weak in the last 2 days. She was hospitalized at [facility] hospital about 2 weeks ago, for presumably PNA. She says she had fluid in one side of the lung. She apparently completed course of abx then. However, she has continued to feel unwell and weak, with some abdominal pain, SOBE. At [facility] ED patient was found to have bilateral pleural effusions with atelectasis, in setting of acute on chronic renal failure, BUN/Cr 77/4.97 today with a potassium of 6.6. Patient says she has an atrophic kidney, and has seen Dr Hyman Hopes in the past but apparently got lost to follow up. She says she has had some belly pains and had some diarrhea today. She was on cefuroxime till about 4 days ago for "PNA". She uses oxygen and nebuliser treatments at home but she has been "whistling" in the chest lately. At [facility] her ProBNP was more than 20000. She denies not recall recent 2 decho. She admits to some subjective fever. No dysuria. She was given kayexalate/insulin/d50w and referred to [facility] as she may require dialysis. I have informed Dr Arlean Hopping about the patient and he will graciously see in am.     Review of Systems:  The patient denies anorexia, fever, weight loss,, vision loss, decreased hearing, hoarseness, chest pain, syncope, dyspnea on exertion, peripheral edema, balance deficits, hemoptysis, abdominal pain, melena, hematochezia, severe indigestion/heartburn, hematuria, incontinence, genital sores, muscle weakness, suspicious skin lesions, transient blindness, difficulty walking, depression, unusual weight change, abnormal bleeding, enlarged lymph nodes, angioedema, and breast masses.  Past Medical History: Past Medical History  Diagnosis Date  . Diabetes mellitus   . Hypercholesterolemia   . Hypothyroidism   . Colitis   . Chronic  kidney disease   . Coronary artery disease   . Hypertension   . GERD (gastroesophageal reflux disease)   . Myocardial infarction   . Peripheral vascular disease   . COPD (chronic obstructive pulmonary disease)   . Pneumonia    Past Surgical History  Procedure Date  . Partial hysterectomy   . Tubal ligation   . Amputaton of right fifth toe   . Tonsillectomy     Medications: Prior to Admission medications   Medication Sig Start Date End Date Taking? Authorizing Provider  nitroGLYCERIN (NITROSTAT) 0.4 MG SL tablet Place 0.4 mg under the tongue every 5 (five) minutes as needed. For chest pain.    Yes Historical Provider, MD  AMLODIPINE BESYLATE PO Take by mouth daily.      Historical Provider, MD  Atorvastatin Calcium (LIPITOR PO) Take by mouth daily.      Historical Provider, MD  CLONIDINE HCL PO Take by mouth 3 (three) times daily.      Historical Provider, MD  Clopidogrel Bisulfate (PLAVIX PO) Take by mouth daily.      Historical Provider, MD  HYDRALAZINE HCL PO Take by mouth 2 (two) times daily.      Historical Provider, MD  Insulin Isophane Human (HUMULIN [place]) Inject into the skin 2 (two) times daily.      Historical Provider, MD  ISOSORBIDE MONONITRATE PO Take by mouth daily.      Historical Provider, MD  METOPROLOL TARTRATE PO Take by mouth 2 (two) times daily.      Historical Provider, MD  PREDNISONE PO Take by mouth daily.  Historical Provider, MD  Ranolazine (RANEXA PO) Take by mouth 2 (two) times daily.      Historical Provider, MD  SODIUM BICARBONATE PO Take by mouth 2 (two) times daily.      Historical Provider, MD    Allergies:   Allergies  Allergen Reactions  . Novocain Other (See Comments)    Unknown    Social History:  reports that she quit smoking about 4 months ago. Her smoking use included Cigarettes. She has a 40 pack-year smoking history. She does not have any smokeless tobacco history on file. She reports that she does not drink alcohol or use  illicit drugs.  Family History: No family history on file.  Physical Exam: Filed Vitals:   10/11/11 2216  BP: 157/56  Pulse: 101  Temp: 97.7 F (36.5 C)  TempSrc: Oral  Resp: 18  Height: 5\' 5"  (1.651 m)  Weight: 74.1 kg (163 lb 5.8 oz)  SpO2: 92%   General appearance: alert, appears older than stated age and mild distress Head: Normocephalic, without obvious abnormality, atraumatic Eyes: conjunctivae/corneas clear. PERRL, EOM's intact. Fundi benign. Throat: lips, mucosa, and tongue normal; teeth and gums normal Neck: no adenopathy, no carotid bruit, no JVD, supple, symmetrical, trachea midline and thyroid not enlarged, symmetric, no tenderness/mass/nodules Resp:scattered wheezing bilaterally. Basilar rhonchi, reduced air entry, especially both bases. Cardio: regular rate and rhythm, S1, S2 normal, no murmur, click, rub or gallop GI: soft, non-tender; bowel sounds normal; no masses,  no organomegaly Extremities: extremities normal, atraumatic, no cyanosis or edema Pulses: 2+ and symmetric Neurologic: Grossly normal   Labs on Admission:  Reviewed labs from [facility] Radiological Exams on Admission:- reviewed cxr results from [place].   Assessment 70 year old female with acute on chronic CKD with worsening pleural effusions/hyperkalemia. Wonder what precipitating event was-?abx/gi loss. Surprisingly has not seen renal in recent years, hence no discussion about possible dialysis till today. She has respiratory compromise, likely combination of copd/pleural effusions/atelectasis?PNA, possible cardiac element. .ARF (acute respiratory failure)- Admit sdu. Diurese/bronchodilators/O2/steroids,[place] w/u-2decho/cxr/cardiac enzymes. If no improvement, may need HD/pleural tap? Marland KitchenPleural effusion, bilateral- Likely related to advanced kidney disease. Consider pleural tap if not improving with diuresis. .Hyperkalemia-kayexalate. Recheck level, if refractory may need hd. Marland KitchenAKI (acute kidney  injury)- renal ultrasound/ random urine sodium/creatinine/ua/uc .CKD (chronic kidney disease) stage 5, GFR less than 15 ml/min- dr Arlean Hopping will see in am. .DM (diabetes mellitus)- check HbA1C. lantus/SSI .COPD with acute exacerbation- steroids/O2/Abx. Marland Kitchen anemia- likely anemia of chronic kidney disease. Anemia panel. Condition critical, time spent 65 minutes. Imya Mance,[phone] 10/11/2011, 11:09 PM

## 2011-10-12 ENCOUNTER — Other Ambulatory Visit: Payer: Self-pay

## 2011-10-12 DIAGNOSIS — R7989 Other specified abnormal findings of blood chemistry: Secondary | ICD-10-CM

## 2011-10-12 DIAGNOSIS — R748 Abnormal levels of other serum enzymes: Secondary | ICD-10-CM | POA: Diagnosis not present

## 2011-10-12 LAB — DIFFERENTIAL
Lymphocytes Relative: 2 % — ABNORMAL LOW (ref 12–46)
Lymphs Abs: 0.2 10*3/uL — ABNORMAL LOW (ref 0.7–4.0)
Neutrophils Relative %: 97 % — ABNORMAL HIGH (ref 43–77)

## 2011-10-12 LAB — FERRITIN: Ferritin: 172 ng/mL (ref 10–291)

## 2011-10-12 LAB — CBC
HCT: 24.8 % — ABNORMAL LOW (ref 36.0–46.0)
Hemoglobin: 8.6 g/dL — ABNORMAL LOW (ref 12.0–15.0)
Hemoglobin: 8 g/dL — ABNORMAL LOW (ref 12.0–15.0)
MCH: 26.1 pg (ref 26.0–34.0)
MCHC: 32.3 g/dL (ref 30.0–36.0)
Platelets: 295 10*3/uL (ref 150–400)
RBC: 3.32 MIL/uL — ABNORMAL LOW (ref 3.87–5.11)
WBC: 10.1 10*3/uL (ref 4.0–10.5)

## 2011-10-12 LAB — GLUCOSE, CAPILLARY
Glucose-Capillary: 513 mg/dL — ABNORMAL HIGH (ref 70–99)
Glucose-Capillary: 275 mg/dL — ABNORMAL HIGH (ref 70–99)

## 2011-10-12 LAB — RETICULOCYTES
RBC.: 3.07 MIL/uL — ABNORMAL LOW (ref 3.87–5.11)
Retic Count, Absolute: 30.7 10*3/uL (ref 19.0–186.0)

## 2011-10-12 LAB — TSH: TSH: 1.664 u[IU]/mL (ref 0.350–4.500)

## 2011-10-12 LAB — COMPREHENSIVE METABOLIC PANEL
ALT: 37 U/L — ABNORMAL HIGH (ref 0–35)
ALT: 33 U/L (ref 0–35)
AST: 17 U/L (ref 0–37)
Albumin: 2.7 g/dL — ABNORMAL LOW (ref 3.5–5.2)
Albumin: 2.5 g/dL — ABNORMAL LOW (ref 3.5–5.2)
BUN: 75 mg/dL — ABNORMAL HIGH (ref 6–23)
Calcium: 9.2 mg/dL (ref 8.4–10.5)
Calcium: 8.7 mg/dL (ref 8.4–10.5)
GFR calc Af Amer: 9 mL/min — ABNORMAL LOW (ref >90)
GFR calc Af Amer: 10 mL/min — ABNORMAL LOW (ref >90)
Glucose, Bld: 492 mg/dL — ABNORMAL HIGH (ref 70–99)
Sodium: 129 mEq/L — ABNORMAL LOW (ref 135–145)
Sodium: 131 mEq/L — ABNORMAL LOW (ref 135–145)
Total Protein: 6.9 g/dL (ref 6.0–8.3)
Total Protein: 6.6 g/dL (ref 6.0–8.3)

## 2011-10-12 LAB — BLOOD GAS, ARTERIAL
Acid-base deficit: 1.9 mmol/L (ref 0.0–2.0)
Drawn by: 252031
FIO2: 1 %
pCO2 arterial: 48.5 mmHg — ABNORMAL HIGH (ref 35.0–45.0)
pH, Arterial: 7.307 — ABNORMAL LOW (ref 7.350–7.400)
pO2, Arterial: 120 mmHg — ABNORMAL HIGH (ref 80.0–100.0)

## 2011-10-12 LAB — MAGNESIUM: Magnesium: 2.4 mg/dL (ref 1.5–2.5)

## 2011-10-12 LAB — CARDIAC PANEL(CRET KIN+CKTOT+MB+TROPI)
CK, MB: 4 ng/mL (ref 0.3–4.0)
CK, MB: 4.6 ng/mL — ABNORMAL HIGH (ref 0.3–4.0)
Relative Index: INVALID (ref 0.0–2.5)
Relative Index: INVALID (ref 0.0–2.5)
Troponin I: <0.3 ng/mL (ref <0.30)
Troponin I: 3.13 ng/mL (ref <0.30)

## 2011-10-12 LAB — IRON AND TIBC
Iron: 36 ug/dL — ABNORMAL LOW (ref 42–135)
Saturation Ratios: 15 % — ABNORMAL LOW (ref 20–55)
UIBC: 206 ug/dL (ref 125–400)

## 2011-10-12 LAB — PROTIME-INR
INR: 0.95 (ref 0.00–1.49)
INR: 1 (ref 0.00–1.49)
Prothrombin Time: 12.9 seconds (ref 11.6–15.2)

## 2011-10-12 LAB — APTT: aPTT: 36 seconds (ref 24–37)

## 2011-10-12 MED ORDER — NITROGLYCERIN 2 % TD OINT: 0.5000 [in_us] | TOPICAL_OINTMENT | Freq: Four times a day (QID) | TRANSDERMAL | Status: DC

## 2011-10-12 MED ORDER — NITROGLYCERIN 2 % TD OINT
0.5000 [in_us] | TOPICAL_OINTMENT | Freq: Four times a day (QID) | TRANSDERMAL | Status: DC
  Administered 2011-10-12 – 2011-10-13 (×4): 0.5 [in_us] via TOPICAL
  Administered 2011-10-13: 1 [in_us] via TOPICAL
  Filled 2011-10-12: qty 30

## 2011-10-12 MED ORDER — DEXTROSE 5 % IV SOLN
160.0000 mg | Freq: Three times a day (TID) | INTRAVENOUS | Status: DC
  Administered 2011-10-12 – 2011-10-14 (×7): 160 mg via INTRAVENOUS
  Filled 2011-10-12 (×9): qty 16

## 2011-10-12 MED ORDER — INSULIN ASPART 100 UNIT/ML ~~LOC~~ SOLN
0.0000 [IU] | Freq: Three times a day (TID) | SUBCUTANEOUS | Status: DC
  Administered 2011-10-12: 20 [IU] via SUBCUTANEOUS

## 2011-10-12 MED ORDER — SODIUM CHLORIDE 0.9 % IJ SOLN
INTRAMUSCULAR | Status: AC
  Filled 2011-10-12: qty 10

## 2011-10-12 MED ORDER — SODIUM CHLORIDE 0.9 % IJ SOLN
INTRAMUSCULAR | Status: AC
  Filled 2011-10-12: qty 20

## 2011-10-12 MED ORDER — FUROSEMIDE 10 MG/ML IJ SOLN: 80.0000 mg | Freq: Four times a day (QID) | INTRAMUSCULAR | Status: DC

## 2011-10-12 MED ORDER — INSULIN ASPART 100 UNIT/ML ~~LOC~~ SOLN
0.0000 [IU] | Freq: Three times a day (TID) | SUBCUTANEOUS | Status: DC
  Administered 2011-10-12: 11 [IU] via SUBCUTANEOUS
  Administered 2011-10-13: 20 [IU] via SUBCUTANEOUS
  Administered 2011-10-13: 7 [IU] via SUBCUTANEOUS
  Administered 2011-10-13: 11 [IU] via SUBCUTANEOUS
  Administered 2011-10-14 – 2011-10-18 (×4): 4 [IU] via SUBCUTANEOUS
  Administered 2011-10-18: 20 [IU] via SUBCUTANEOUS
  Administered 2011-10-19: 7 [IU] via SUBCUTANEOUS
  Administered 2011-10-19 – 2011-10-20 (×4): 4 [IU] via SUBCUTANEOUS
  Administered 2011-10-21: 3 [IU] via SUBCUTANEOUS
  Administered 2011-10-22 (×2): 4 [IU] via SUBCUTANEOUS
  Administered 2011-10-22: 3 [IU] via SUBCUTANEOUS
  Administered 2011-10-24 – 2011-10-25 (×3): 4 [IU] via SUBCUTANEOUS
  Administered 2011-10-25 – 2011-10-26 (×3): 3 [IU] via SUBCUTANEOUS
  Administered 2011-10-26: 4 [IU] via SUBCUTANEOUS
  Administered 2011-10-27: 7 [IU] via SUBCUTANEOUS
  Administered 2011-10-28: 4 [IU] via SUBCUTANEOUS
  Administered 2011-10-28 (×2): 3 [IU] via SUBCUTANEOUS
  Administered 2011-10-29: 4 [IU] via SUBCUTANEOUS
  Administered 2011-10-29: 7 [IU] via SUBCUTANEOUS
  Administered 2011-10-30 – 2011-10-31 (×3): 3 [IU] via SUBCUTANEOUS
  Filled 2011-10-12 (×3): qty 3

## 2011-10-12 MED ORDER — AMLODIPINE BESYLATE 10 MG PO TABS
10.0000 mg | ORAL_TABLET | Freq: Every day | ORAL | Status: DC
  Administered 2011-10-12 – 2011-10-20 (×7): 10 mg via ORAL
  Filled 2011-10-12 (×10): qty 1

## 2011-10-12 MED ORDER — INSULIN GLARGINE 100 UNIT/ML ~~LOC~~ SOLN
5.0000 [IU] | Freq: Every day | SUBCUTANEOUS | Status: DC
  Administered 2011-10-12: 5 [IU] via SUBCUTANEOUS
  Filled 2011-10-12: qty 3

## 2011-10-12 MED ORDER — INSULIN ASPART 100 UNIT/ML ~~LOC~~ SOLN: 0.0000 [IU] | Freq: Every day | SUBCUTANEOUS | Status: DC

## 2011-10-12 MED ORDER — INSULIN ASPART 100 UNIT/ML ~~LOC~~ SOLN
0.0000 [IU] | Freq: Every day | SUBCUTANEOUS | Status: DC
  Administered 2011-10-12: 3 [IU] via SUBCUTANEOUS
  Administered 2011-10-13: 4 [IU] via SUBCUTANEOUS
  Administered 2011-10-14 – 2011-10-19 (×3): 2 [IU] via SUBCUTANEOUS
  Administered 2011-10-19: 3 [IU] via SUBCUTANEOUS
  Administered 2011-10-24 – 2011-10-27 (×2): 2 [IU] via SUBCUTANEOUS
  Administered 2011-10-29 – 2011-10-30 (×2): 3 [IU] via SUBCUTANEOUS
  Filled 2011-10-12: qty 3

## 2011-10-12 NOTE — Progress Notes (Signed)
Subjective: Breathing is better than when admitted. Essentially presented to [facility] medical center for fatigue which has been going on for a few weeks. No complaints of chest pain.   Objective: Blood pressure 144/63, pulse 98, temperature 98.7 F (37.1 C), temperature source Oral, resp. rate 17, height 5\' 5"  (1.651 m), weight 74.1 kg (163 lb 5.8 oz), SpO2 97.00%. Weight change:   Intake/Output Summary (Last 24 hours) at 10/12/11 2018 Last data filed at 10/12/11 1555  Gross per 24 hour  Intake    666 ml  Output    976 ml  Net   -310 ml    Physical Exam: General appearance: alert,  mild distress, has face mask.  Head: Normocephalic, without obvious abnormality, atraumatic  Eyes: conjunctivae/corneas clear. PERRL, EOM's intact.  Throat: lips, mucosa, and tongue normal Neck: no adenopathy, no carotid bruit, no JVD, supple,thyroid not enlarged, no tenderness/mass/nodules  Resp:scattered wheezing bilaterally. Basilar rhonchi, reduced air entry, especially both bases.  Cardio: regular rate and rhythm, S1, S2 normal, no murmur, click, rub or gallop  GI: soft, non-tender; bowel sounds normal; no masses, no organomegaly  Extremities: extremities normal, atraumatic, no cyanosis or edema  Pulses: 2+ and symmetric  Neurologic: Grossly normal   Lab Results:  Basename 10/12/11 0740 10/12/11 0100  NA 131* 129*  K 5.0 5.5*  CL 89* 87*  CO2 28 25  GLUCOSE 464* 492*  BUN 73* 75*  CREATININE 4.84* 5.16*  CALCIUM 8.7 9.2  MG -- 2.4  PHOS -- 5.3*    Basename 10/12/11 0740 10/12/11 0100  AST 17 17  ALT 33 37*  ALKPHOS 203* 216*  BILITOT 0.2* 0.2*  PROT 6.6 6.9  ALBUMIN 2.5* 2.7*   No results found for this basename: LIPASE:2,AMYLASE:2 in the last 72 hours  Basename 10/12/11 0740 10/12/11 0100  WBC 9.8 10.1  NEUTROABS -- 9.8*  HGB 8.0* 8.6*  HCT 24.8* 27.2*  MCV 80.8 81.9  PLT 261 295    Basename 10/12/11 1509 10/12/11 0740 10/12/11 0100  CKTOTAL 45 36 26  CKMB 4.6* 4.0  2.9  CKMBINDEX -- -- --  TROPONINI 4.03* 3.13* <0.30   No results found for this basename: POCBNP:3 in the last 72 hours No results found for this basename: DDIMER:2 in the last 72 hours  Basename 10/12/11 0100  HGBA1C 10.3*   No results found for this basename: CHOL:2,HDL:2,LDLCALC:2,TRIG:2,CHOLHDL:2,LDLDIRECT:2 in the last 72 hours  Basename 10/12/11 0100  TSH 1.664  T4TOTAL --  T3FREE --  THYROIDAB --    Basename 10/12/11 0740  VITAMINB12 245  FOLATE >20.0  FERRITIN 172  TIBC 242*  IRON 36*  RETICCTPCT 1.0    Micro Results: Recent Results (from the past 240 hour(s))  MRSA PCR SCREENING     Status: Normal   Collection Time   10/11/11  9:25 PM      Component Value Range Status Comment   MRSA by PCR NEGATIVE  NEGATIVE  Final     Studies/Results: Portable Chest 1 View  10/12/2011  *RADIOLOGY REPORT*  Clinical Data: Shortness of breath.  PORTABLE CHEST - 1 VIEW 10/12/2011 0006 hours:  Comparison: Portable chest x-ray yesterday, 09/25/2011, and two- view chest x-ray 09/24/2011 [facility].  Findings: Left jugular central venous catheter tip in the upper SVC.  No evidence of pneumothorax mediastinal hematoma.  Cardiac silhouette enlarged.  Pulmonary venous hypertension and mild asymmetric interstitial pulmonary edema, left greater than right. Small bilateral pleural effusions, left greater than right.  Dense consolidation in the left  lower lobe, unchanged.  IMPRESSION: New asymmetric mild interstitial pulmonary edema, left greater than right.  Stable bilateral pleural effusions, left greater than right.  Stable dense left lower lobe atelectasis and/or pneumonia.  Original Report Authenticated By: Arnell Sieving, M.D.    Medications: Scheduled Meds:   . ipratropium  0.5 mg Nebulization Q4H   And  . albuterol  2.5 mg Nebulization Q4H  . amLODipine  10 mg Oral QHS  . aspirin  81 mg Oral Daily  . cloNIDine  0.1 mg Oral TID  . clopidogrel  75 mg Oral Q breakfast  .  docusate sodium  100 mg Oral BID  . furosemide  160 mg Intravenous Q8H  . furosemide  80 mg Intravenous Once  . guaiFENesin  600 mg Oral BID  . hydrALAZINE  50 mg Oral Q8H  . insulin aspart  0-20 Units Subcutaneous TID WC  . insulin aspart  0-5 Units Subcutaneous QHS  . insulin aspart  8 Units Subcutaneous Once  . insulin glargine  30 Units Subcutaneous Q0700  . insulin glargine  5 Units Subcutaneous QHS  . isosorbide mononitrate  120 mg Oral Daily  . levothyroxine  25 mcg Oral QAC breakfast  . metoprolol  50 mg Oral BID  . moxifloxacin  400 mg Intravenous Q24H  . nitroGLYCERIN  1 inch Topical Q6H  . pantoprazole  40 mg Oral Q1200  . predniSONE  40 mg Oral 1 day or 1 dose  . sodium bicarbonate  650 mg Oral TID  . sodium chloride      . sodium polystyrene  30 g Oral Once  . DISCONTD: amLODipine  10 mg Oral Daily  . DISCONTD: furosemide  40 mg Intravenous Q24H  . DISCONTD: furosemide  80 mg Intravenous Q6H  . DISCONTD: insulin aspart  0-15 Units Subcutaneous TID WC  . DISCONTD: insulin aspart  0-5 Units Subcutaneous QHS  . DISCONTD: insulin aspart  0-9 Units Subcutaneous TID WC  . DISCONTD: nitroGLYCERIN  1 inch Topical Q6H  . DISCONTD: sodium chloride  3 mL Intravenous Q12H   Continuous Infusions:  PRN Meds:.sodium chloride, acetaminophen, acetaminophen, nitroGLYCERIN, ondansetron (ZOFRAN) IV, ondansetron, oxyCODONE, DISCONTD: sodium chloride  Assessment/Plan: Principal Problem:  *ARF (acute respiratory failure)- Multifactorial from effusions and COPD. She is on 100% FiO2 and stable.     Pleural effusion, bilateral- follow up on ECHO. If diuresis is ineffective, will need thoracentesis.   Elevated TNI w/o noted elevation in CKMB- Cardiology has evaluated the patient. This rise in Cr is likely due to the renal failure.   AKI- Poor urine output. Have increase Lasix from 40 mg daily to 80 QID. Dr Arta Silence has increased it further.  H/o one atropic kidney. Ultrasound not yet done.      Hyperkalemia- has improved for now. Hopefully will continue to improve w/ lasix.    DM (diabetes mellitus)- Placed on high dose sliding scale and added a 5mg  dose of lantus at night which can be d/c'd once off steroids and sugars improved.    COPD with acute exacerbation- on Prednisone and Nebs.    HTN (hypertension), malignant - I have added a low dose of nitrate (paste) which may help if there is a CHF component to the effusions.    Anemia- AOCD but also noted to be iron deficient. Have not yet started iron.    LOS: 1 day   [facility] [phone] 10/12/2011, 8:18 PM

## 2011-10-12 NOTE — Consults (Signed)
[name] [name] 10/12/2011 Ajee Heasley D Requesting Physician:  Dr. Venetia Constable  Reason for Consult:  Acute on CRF, SOB and CHF HPI: The patient is a 70 y.o. year-old WF presenting with 2d hx of SOB and fatigue.  Came to [facility] ED where CXR showed CHF and pt transferred to [facility] for admission.  BUN/creat high at 73/4.84.   Last month during stay at [facility], creatinine ranged between 1.8-2.1.   She has hx of "CHF" followed by cardiologist in [place].  Denies hx of MI or stents.  She saw CKD physician, but last time was about two years ago.  +cough, nonproductive.  No fever, chills or sweats, no purulent sputum production, no chest pain.  Uses only tylenol for OTC meds, no NSAID's.  She has COPD as well, not on home oxygen.  She has hx of longstanding HTN and DM2.  One of her kidneys is "shrunken", also.   Past Medical History:  Past Medical History  Diagnosis Date  . Diabetes mellitus   . Hypercholesterolemia   . Hypothyroidism   . Colitis   . Chronic kidney disease   . Coronary artery disease   . Hypertension   . GERD (gastroesophageal reflux disease)   . Myocardial infarction   . Peripheral vascular disease   . COPD (chronic obstructive pulmonary disease)   . Pneumonia     Past Surgical History:  Past Surgical History  Procedure Date  . Partial hysterectomy   . Tubal ligation   . Amputaton of right fifth toe   . Tonsillectomy     Family History: No family history on file. Social History:  reports that she quit smoking about 4 months ago. Her smoking use included Cigarettes. She has a 40 pack-year smoking history. She does not have any smokeless tobacco history on file. She reports that she does not drink alcohol or use illicit drugs.  Allergies:  Allergies  Allergen Reactions  . Novocain Other (See Comments)    Unknown    Home medications: Prior to Admission medications   Medication Sig Start Date End Date Taking? Authorizing Provider  amLODipine (NORVASC) 10 MG  tablet Take 10 mg by mouth daily.     Yes Historical Provider, MD  cloNIDine (CATAPRES) 0.2 MG tablet Take 0.2 mg by mouth 3 (three) times daily.     Yes Historical Provider, MD  furosemide (LASIX) 40 MG tablet Take 40 mg by mouth 2 (two) times daily.     Yes Historical Provider, MD  hydrALAZINE (APRESOLINE) 50 MG tablet Take 50 mg by mouth 2 (two) times daily.     Yes Historical Provider, MD  insulin glargine (LANTUS) 100 UNIT/ML injection Inject 1-100 Units into the skin at bedtime. Use as directed from pharmacy list.    Yes Historical Provider, MD  insulin regular (HUMULIN R,NOVOLIN R) 100 units/mL injection Inject 1-100 Units into the skin 3 (three) times daily before meals. Use as directed per pharmacy list.    Yes Historical Provider, MD  isosorbide mononitrate (IMDUR) 120 MG 24 hr tablet Take 120 mg by mouth daily.     Yes Historical Provider, MD  levothyroxine (SYNTHROID, LEVOTHROID) 25 MCG tablet Take 25 mcg by mouth daily.     Yes Historical Provider, MD  metoprolol (LOPRESSOR) 50 MG tablet Take 50 mg by mouth 2 (two) times daily.     Yes Historical Provider, MD  nitroGLYCERIN (NITROSTAT) 0.4 MG SL tablet Place 0.4 mg under the tongue every 5 (five) minutes as needed. For chest pain.  Yes Historical Provider, MD  pantoprazole (PROTONIX) 40 MG tablet Take 40 mg by mouth daily.     Yes Historical Provider, MD  potassium chloride SA (K-DUR,KLOR-CON) 20 MEQ tablet Take 20 mEq by mouth 2 (two) times daily.     Yes Historical Provider, MD  ranitidine (ZANTAC) 150 MG tablet Take 150 mg by mouth 2 (two) times daily.     Yes Historical Provider, MD    Inpatient medications:    . ipratropium  0.5 mg Nebulization Q4H   And  . albuterol  2.5 mg Nebulization Q4H  . amLODipine  10 mg Oral QHS  . aspirin  81 mg Oral Daily  . cloNIDine  0.1 mg Oral TID  . clopidogrel  75 mg Oral Q breakfast  . docusate sodium  100 mg Oral BID  . furosemide  160 mg Intravenous Q8H  . furosemide  80 mg  Intravenous Once  . guaiFENesin  600 mg Oral BID  . hydrALAZINE  50 mg Oral Q8H  . insulin aspart  0-20 Units Subcutaneous TID WC  . insulin aspart  0-5 Units Subcutaneous QHS  . insulin aspart  8 Units Subcutaneous Once  . insulin glargine  30 Units Subcutaneous Q0700  . insulin glargine  5 Units Subcutaneous QHS  . isosorbide mononitrate  120 mg Oral Daily  . levothyroxine  25 mcg Oral QAC breakfast  . metoprolol  50 mg Oral BID  . moxifloxacin  400 mg Intravenous Q24H  . nitroGLYCERIN  1 inch Topical Q6H  . pantoprazole  40 mg Oral Q1200  . predniSONE  40 mg Oral 1 day or 1 dose  . sodium bicarbonate  650 mg Oral TID  . sodium chloride      . sodium polystyrene  30 g Oral Once  . DISCONTD: amLODipine  10 mg Oral Daily  . DISCONTD: furosemide  40 mg Intravenous Q24H  . DISCONTD: furosemide  80 mg Intravenous Q6H  . DISCONTD: insulin aspart  0-15 Units Subcutaneous TID WC  . DISCONTD: insulin aspart  0-5 Units Subcutaneous QHS  . DISCONTD: insulin aspart  0-9 Units Subcutaneous TID WC  . DISCONTD: nitroGLYCERIN  1 inch Topical Q6H  . DISCONTD: sodium chloride  3 mL Intravenous Q12H    Review of Systems Gen:  Denies headache, fever, chills, sweats.  No weight loss. HEENT:  No visual change, sore throat, difficulty swallowing. Cardiac:  No chest pain, orthopnea, PND. GI:   Denies abdominal pain.   No nausea, vomiting, diarrhea.  No constipation. GU:  Denies difficulty or change in voiding.  No change in urine color.     MS:  Denies joint pain or swelling.   Derm:  Denies skin rash or itching.  No chronic skin conditions.  Neuro:   Denies focal weakness, memory problems, hx stroke or TIA.   Psych:  Denies symptoms of depression of anxiety.  No hallucination.    Physical Exam:  Blood pressure 149/64, pulse 101, temperature 98.1 F (36.7 C), temperature source Oral, resp. rate 23, height 5\' 5"  (1.651 m), weight 74.1 kg (163 lb 5.8 oz), SpO2 96.00%.  Gen: alert Skin: warm and  dry Neck: + JVD Chest: bibasilar fine rales, otherwise clear Heart: regular, no rub or gallop Abdomen: soft, obese, nontender, no ascites or masses Ext: trace edema LE's only  Neuro: no asterixis Heme/Lymph: no LAN Psych: calm  Labs: Basic Metabolic Panel:  Lab 10/12/11 2956 10/12/11 0100  NA 131* 129*  K 5.0 5.5*  CL  89* 87*  CO2 28 25  GLUCOSE 464* 492*  BUN 73* 75*  CREATININE 4.84* 5.16*  ALB -- --  CALCIUM 8.7 9.2  PHOS -- 5.3*   Liver Function Tests:  Lab 10/12/11 0740 10/12/11 0100  AST 17 17  ALT 33 37*  ALKPHOS 203* 216*  BILITOT 0.2* 0.2*  PROT 6.6 6.9  ALBUMIN 2.5* 2.7*   No results found for this basename: LIPASE:3,AMYLASE:3 in the last 168 hours No results found for this basename: AMMONIA:3 in the last 168 hours CBC:  Lab 10/12/11 0740 10/12/11 0100  WBC 9.8 10.1  NEUTROABS -- 9.8*  HGB 8.0* 8.6*  HCT 24.8* 27.2*  MCV 80.8 81.9  PLT 261 295   PT/INR: @labrcntip (inr:5) Cardiac Enzymes:  Lab 10/12/11 0740 10/12/11 0100  CKTOTAL 36 26  CKMB 4.0 2.9  CKMBINDEX -- --  TROPONINI 3.13* <0.30   CBG:  Lab 10/12/11 1220 10/12/11 1006 10/12/11 0819 10/11/11 2103  GLUCAP 408* 468* 513* 426*    Iron Studies:  Lab 10/12/11 0740  IRON 36*  TIBC 242*  TRANSFERRIN --  FERRITIN 172   Iron Sat = 15%   Xrays/Other Studies: Portable Chest 1 View  10/12/2011  *RADIOLOGY REPORT*  Clinical Data: Shortness of breath.  PORTABLE CHEST - 1 VIEW 10/12/2011 0006 hours:  Comparison: Portable chest x-ray yesterday, 09/25/2011, and two- view chest x-ray 09/24/2011 [facility]  Findings: Left jugular central venous catheter tip in the upper SVC.  No evidence of pneumothorax mediastinal hematoma.  Cardiac silhouette enlarged.  Pulmonary venous hypertension and mild asymmetric interstitial pulmonary edema, left greater than right. Small bilateral pleural effusions, left greater than right.  Dense consolidation in the left lower lobe, unchanged.  IMPRESSION:  New asymmetric mild interstitial pulmonary edema, left greater than right.  Stable bilateral pleural effusions, left greater than right.  Stable dense left lower lobe atelectasis and/or pneumonia.  Original Report Authenticated By: Arnell Sieving, M.D.    Assessment/Plan 1)  Acute on chronic kidney failure, uncertain cause.  No toxins, no ACEI or ARM.  Urine looks unremarkable. She is volume overloaded and this could be hemodynamic from poor LV fxn (?EF).  Baseline creat around 2.0.  Will increase diuretics- she is comfortable on face mask 02 at this time.  Watch closely.  Acute HD if needed for volume or worsening azotemia. SAve left arm. Check PTH.  Consider IV iron and ESA when more stable. Renal US ordered.   Check LV fxn.  2)  SOB with pulm edema - highdose diuretics.  3)  HTN, on several meds 4)  DM 2  5)  COPD 6)  Former smoker 7)   Anemia - see above.   Will follow.  Oak Dorey D 10/12/2011, 2:10 PM Cell #  [phone]

## 2011-10-12 NOTE — Consult Note (Signed)
CARDIOLOGY CONSULT NOTE  Patient ID: [name] [name] MRN: [id number] DOB/AGE: Jun 17, 1941 70 y.o.  Admit date: 10/11/2011 Referring Physician: Lester Kinsman Primary Physician: No primary provider on file. Primary Cardiologist:  Dulce Sellar with [facility] Reason for Consultation: Positive Troponin  Principal Problem:  *ARF (acute respiratory failure) Active Problems:  Pleural effusion, bilateral  Hyperkalemia  AKI (acute kidney injury)  CKD (chronic kidney disease) stage 5, GFR less than 15 ml/min  DM (diabetes mellitus)  COPD with acute exacerbation  HTN (hypertension), malignant  Anemia   HPI:  Chronically ill 70 yo with A/CRF, PVD, respitory failure.  Transferred from [place] for potential need for dialysis.  Son and patient are poor historians.   Recent hospitalization at [facility] for pneumonia.  Asked to see for positive troponin.  She has no acute ECG changes, no SSCP, and CPK"s are both negative Patient normally seen in [place] by Dr Sherril Croon and no Dulce Sellar.  Describes heart cath within last year but ? Access problems.  Indicates they couldn't do something From the right wrist.  She is chronically ill but has no documented history of CAD.  Bad PVD with limited pulses in legs.  CRF likely vascular as she indicates only Having one kidney as other one "shriveled up".    @ROS @ All other systems reviewed and negative except as noted above  Past Medical History  Diagnosis Date  . Diabetes mellitus   . Hypercholesterolemia   . Hypothyroidism   . Colitis   . Chronic kidney disease   . Coronary artery disease   . Hypertension   . GERD (gastroesophageal reflux disease)   . Myocardial infarction   . Peripheral vascular disease   . COPD (chronic obstructive pulmonary disease)   . Pneumonia     No family history on file.  History   Social History  . Marital Status: Widowed    Spouse Name: N/A    Number of Children: N/A  . Years of Education: N/A   Occupational History  . Not  on file.   Social History Main Topics  . Smoking status: Former Smoker -- 1.0 packs/day for 40 years    Types: Cigarettes    Quit date: 06/11/2011  . Smokeless tobacco: Not on file  . Alcohol Use: No  . Drug Use: No  . Sexually Active: Yes    Birth Control/ Protection: Post-menopausal   Other Topics Concern  . Not on file   Social History Narrative  . No narrative on file    Past Surgical History  Procedure Date  . Partial hysterectomy   . Tubal ligation   . Amputaton of right fifth toe   . Tonsillectomy         . ipratropium  0.5 mg Nebulization Q4H   And  . albuterol  2.5 mg Nebulization Q4H  . amLODipine  10 mg Oral QHS  . aspirin  81 mg Oral Daily  . cloNIDine  0.1 mg Oral TID  . clopidogrel  75 mg Oral Q breakfast  . docusate sodium  100 mg Oral BID  . furosemide  40 mg Intravenous Q24H  . furosemide  80 mg Intravenous Once  . guaiFENesin  600 mg Oral BID  . hydrALAZINE  50 mg Oral Q8H  . insulin aspart  0-15 Units Subcutaneous TID WC  . insulin aspart  0-5 Units Subcutaneous QHS  . insulin aspart  8 Units Subcutaneous Once  . insulin glargine  30 Units Subcutaneous Q0700  . isosorbide mononitrate  120 mg  Oral Daily  . levothyroxine  25 mcg Oral QAC breakfast  . metoprolol  50 mg Oral BID  . moxifloxacin  400 mg Intravenous Q24H  . pantoprazole  40 mg Oral Q1200  . predniSONE  40 mg Oral 1 day or 1 dose  . sodium bicarbonate  650 mg Oral TID  . sodium chloride      . sodium polystyrene  30 g Oral Once  . DISCONTD: amLODipine  10 mg Oral Daily  . DISCONTD: insulin aspart  0-9 Units Subcutaneous TID WC  . DISCONTD: sodium chloride  3 mL Intravenous Q12H      Physical Exam: Blood pressure 154/61, pulse 95, temperature 97.5 F (36.4 C), temperature source Oral, resp. rate 17, height 5\' 5"  (1.651 m), weight 74.1 kg (163 lb 5.8 oz), SpO2 97.00%.    General appearance: alert and no distress Neck: no adenopathy, no carotid bruit, no JVD, supple,  symmetrical, trachea midline and thyroid not enlarged, symmetric, no tenderness/mass/nodules Resp: Expitory rhonchi and wheezes Cardio: regular rate and rhythm, S1, S2 normal, no murmur, click, rub or gallop GI: soft, non-tender; bowel sounds normal; no masses,  no organomegaly Extremities: extremities normal, atraumatic, no cyanosis or edema Pulses: 2+ and symmetric Very faint pulse in RFA and LFA poor pulses below knees  Labs:   Lab Results  Component Value Date   WBC 9.8 10/12/2011   HGB 8.0* 10/12/2011   HCT 24.8* 10/12/2011   MCV 80.8 10/12/2011   PLT 261 10/12/2011    Lab 10/12/11 0740  NA 131*  K 5.0  CL 89*  CO2 28  BUN 73*  CREATININE 4.84*  CALCIUM 8.7  PROT 6.6  BILITOT 0.2*  ALKPHOS 203*  ALT 33  AST 17  GLUCOSE 464*   Lab Results  Component Value Date   CKTOTAL 36 10/12/2011   CKMB 4.0 10/12/2011   TROPONINI 3.13* 10/12/2011    No results found for this basename: CHOL   No results found for this basename: HDL   No results found for this basename: LDLCALC   No results found for this basename: TRIG   No results found for this basename: CHOLHDL   No results found for this basename: LDLDIRECT      Radiology: Portable Chest 1 View  10/12/2011  *RADIOLOGY REPORT*  Clinical Data: Shortness of breath.  PORTABLE CHEST - 1 VIEW 10/12/2011 0006 hours:  Comparison: Portable chest x-ray yesterday, 09/25/2011, and two- view chest x-ray 09/24/2011 [facility].  Findings: Left jugular central venous catheter tip in the upper SVC.  No evidence of pneumothorax mediastinal hematoma.  Cardiac silhouette enlarged.  Pulmonary venous hypertension and mild asymmetric interstitial pulmonary edema, left greater than right. Small bilateral pleural effusions, left greater than right.  Dense consolidation in the left lower lobe, unchanged.  IMPRESSION: New asymmetric mild interstitial pulmonary edema, left greater than right.  Stable bilateral pleural effusions, left greater than  right.  Stable dense left lower lobe atelectasis and/or pneumonia.  Original Report Authenticated By: Arnell Sieving, M.D.    EKG: 12/9  SR LVH with strain no acute changes   ASSESSMENT AND PLAN:  Troponin:  Not sure why it was checked in the setting of renal failure.  No symptoms, no acute ECG changes and more importantly CPK's are negative and no evolution.  Agree with echo. She certainly should have CAD given smoking and PVD with DM.  However I dont think this is contributing to any acute problem.  Will try to get  records from [facility] but dont Suspect any other cardiac w/u will be done in hospital unless echo abnormal.  Certainly no cath given Cr and apparent access issues with previous attempt at cath at [facility] to proceed with any procedures like renal catheter placement or dialysis COPD:  Recent pneumonia Quit smoking about 2 months ago.  Continue antibiotics and nebs PVD:  Severe but no nonhealing ulcers and has foot doctor in [place] that she sees regularly  F/U [facility]  Use to see Liliane Bade and they will likely need to see her for fistula  Would have  Them see her in hospital to establish care  Signed: Charlton Haws 10/12/2011, 11:47 AM

## 2011-10-13 ENCOUNTER — Inpatient Hospital Stay (HOSPITAL_COMMUNITY): Payer: Medicare Other

## 2011-10-13 DIAGNOSIS — Z992 Dependence on renal dialysis: Secondary | ICD-10-CM

## 2011-10-13 DIAGNOSIS — I4891 Unspecified atrial fibrillation: Secondary | ICD-10-CM

## 2011-10-13 LAB — RENAL FUNCTION PANEL
Albumin: 2.4 g/dL — ABNORMAL LOW (ref 3.5–5.2)
BUN: 79 mg/dL — ABNORMAL HIGH (ref 6–23)
Chloride: 90 mEq/L — ABNORMAL LOW (ref 96–112)
GFR calc Af Amer: 9 mL/min — ABNORMAL LOW (ref >90)
Glucose, Bld: 231 mg/dL — ABNORMAL HIGH (ref 70–99)
Potassium: 4.2 mEq/L (ref 3.5–5.1)
Sodium: 135 mEq/L (ref 135–145)

## 2011-10-13 LAB — CBC
MCH: 26.4 pg (ref 26.0–34.0)
MCHC: 32.6 g/dL (ref 30.0–36.0)
RDW: 16.4 % — ABNORMAL HIGH (ref 11.5–15.5)

## 2011-10-13 LAB — GLUCOSE, CAPILLARY
Glucose-Capillary: 230 mg/dL — ABNORMAL HIGH (ref 70–99)
Glucose-Capillary: 325 mg/dL — ABNORMAL HIGH (ref 70–99)

## 2011-10-13 MED ORDER — ALBUTEROL SULFATE (5 MG/ML) 0.5% IN NEBU: 2.5000 mg | INHALATION_SOLUTION | Freq: Three times a day (TID) | RESPIRATORY_TRACT | Status: DC

## 2011-10-13 MED ORDER — INSULIN GLARGINE 100 UNIT/ML ~~LOC~~ SOLN
10.0000 [IU] | Freq: Every day | SUBCUTANEOUS | Status: DC
  Administered 2011-10-13: 10 [IU] via SUBCUTANEOUS
  Filled 2011-10-13: qty 3

## 2011-10-13 MED ORDER — SODIUM CHLORIDE 0.9 % IJ SOLN
INTRAMUSCULAR | Status: AC
  Administered 2011-10-13: 3 mL via INTRAVENOUS
  Filled 2011-10-13: qty 30

## 2011-10-13 MED ORDER — NITROGLYCERIN 2 % TD OINT
0.5000 [in_us] | TOPICAL_OINTMENT | Freq: Four times a day (QID) | TRANSDERMAL | Status: DC
  Administered 2011-10-13 – 2011-10-14 (×4): 0.5 [in_us] via TOPICAL
  Filled 2011-10-13: qty 30

## 2011-10-13 MED ORDER — ALBUTEROL SULFATE (5 MG/ML) 0.5% IN NEBU
2.5000 mg | INHALATION_SOLUTION | Freq: Three times a day (TID) | RESPIRATORY_TRACT | Status: DC
  Administered 2011-10-13 – 2011-10-31 (×46): 2.5 mg via RESPIRATORY_TRACT
  Filled 2011-10-13 (×53): qty 0.5

## 2011-10-13 MED ORDER — INSULIN GLARGINE 100 UNIT/ML ~~LOC~~ SOLN
40.0000 [IU] | SUBCUTANEOUS | Status: DC
  Administered 2011-10-14: 40 [IU] via SUBCUTANEOUS

## 2011-10-13 MED ORDER — IPRATROPIUM BROMIDE 0.02 % IN SOLN: 0.5000 mg | RESPIRATORY_TRACT | Status: DC

## 2011-10-13 MED ORDER — IPRATROPIUM BROMIDE 0.02 % IN SOLN
0.5000 mg | Freq: Three times a day (TID) | RESPIRATORY_TRACT | Status: DC
  Administered 2011-10-13 – 2011-10-31 (×46): 0.5 mg via RESPIRATORY_TRACT
  Filled 2011-10-13 (×53): qty 2.5

## 2011-10-13 MED ORDER — SODIUM CHLORIDE 0.9 % IJ SOLN
INTRAMUSCULAR | Status: AC
  Filled 2011-10-13: qty 20

## 2011-10-13 MED ORDER — SODIUM CHLORIDE 0.9 % IJ SOLN
INTRAMUSCULAR | Status: AC
  Filled 2011-10-13: qty 10

## 2011-10-13 MED ORDER — PREDNISONE 20 MG PO TABS
20.0000 mg | ORAL_TABLET | ORAL | Status: DC
  Administered 2011-10-14: 20 mg via ORAL
  Filled 2011-10-13 (×2): qty 1

## 2011-10-13 NOTE — Progress Notes (Signed)
*  PRELIMINARY RESULTS*  Upper Extremity Venous Mapping has been performed.  Farrel Demark RDMS 10/13/2011, 10:50 AM

## 2011-10-13 NOTE — Consult Note (Signed)
Subjective: Patient denies CP.  Breathing OK Objective: Filed Vitals:   10/13/11 1101 10/13/11 1104 10/13/11 1200 10/13/11 1218  BP: 129/55  129/55   Pulse:  93 86   Temp:   98 F (36.7 C)   TempSrc:   Oral   Resp:   13   Height:      Weight:      SpO2:   95% 94%   Weight change: 1 lb 12.2 oz (0.8 kg)  Intake/Output Summary (Last 24 hours) at 10/13/11 1405 Last data filed at 10/13/11 1200  Gross per 24 hour  Intake    506 ml  Output   1925 ml  Net  -1419 ml    General: Alert, awake, oriented x3, in no acute distress.  HEENT: No bruits, no goiter.  Heart: Regular rate and rhythm, without murmurs, rubs, gallops.  Lungs: Rel clear. Abdomen: Soft, nontender, nondistended, positive bowel sounds Ext:  No signif LE edema.  Neuro: Grossly intact, nonfocal.   Lab Results: Results for orders placed during the hospital encounter of 10/11/11 (from the past 24 hour(s))  CARDIAC PANEL(CRET KIN+CKTOT+MB+TROPI)     Status: Abnormal   Collection Time   10/12/11  3:09 PM      Component Value Range   Total CK 45  7 - 177 (U/L)   CK, MB 4.6 (*) 0.3 - 4.0 (ng/mL)   Troponin I 4.03 (*) <0.30 (ng/mL)   Relative Index RELATIVE INDEX IS INVALID  0.0 - 2.5   GLUCOSE, CAPILLARY     Status: Abnormal   Collection Time   10/12/11  5:14 PM      Component Value Range   Glucose-Capillary 275 (*) 70 - 99 (mg/dL)   Comment 1 Documented in Chart     Comment 2 Notify RN    GLUCOSE, CAPILLARY     Status: Abnormal   Collection Time   10/12/11  9:38 PM      Component Value Range   Glucose-Capillary 269 (*) 70 - 99 (mg/dL)   Comment 1 Notify RN     Comment 2 Documented in Chart    CBC     Status: Abnormal   Collection Time   10/13/11  4:10 AM      Component Value Range   WBC 12.7 (*) 4.0 - 10.5 (K/uL)   RBC 2.92 (*) 3.87 - 5.11 (MIL/uL)   Hemoglobin 7.7 (*) 12.0 - 15.0 (g/dL)   HCT 13.0 (*) 86.5 - 46.0 (%)   MCV 80.8  78.0 - 100.0 (fL)   MCH 26.4  26.0 - 34.0 (pg)   MCHC 32.6  30.0 - 36.0  (g/dL)   RDW 78.4 (*) 69.6 - 15.5 (%)   Platelets 313  150 - 400 (K/uL)  RENAL FUNCTION PANEL     Status: Abnormal   Collection Time   10/13/11  4:10 AM      Component Value Range   Sodium 135  135 - 145 (mEq/L)   Potassium 4.2  3.5 - 5.1 (mEq/L)   Chloride 90 (*) 96 - 112 (mEq/L)   CO2 34 (*) 19 - 32 (mEq/L)   Glucose, Bld 231 (*) 70 - 99 (mg/dL)   BUN 79 (*) 6 - 23 (mg/dL)   Creatinine, Ser 2.95 (*) 0.50 - 1.10 (mg/dL)   Calcium 8.1 (*) 8.4 - 10.5 (mg/dL)   Phosphorus 6.8 (*) 2.3 - 4.6 (mg/dL)   Albumin 2.4 (*) 3.5 - 5.2 (g/dL)   GFR calc non Af Amer 8 (*) >  90 (mL/min)   GFR calc Af Amer 9 (*) >90 (mL/min)  GLUCOSE, CAPILLARY     Status: Abnormal   Collection Time   10/13/11  8:45 AM      Component Value Range   Glucose-Capillary 230 (*) 70 - 99 (mg/dL)   Comment 1 Notify RN     Comment 2 Documented in Chart    GLUCOSE, CAPILLARY     Status: Abnormal   Collection Time   10/13/11 12:12 PM      Component Value Range   Glucose-Capillary 364 (*) 70 - 99 (mg/dL)   Comment 1 Notify RN     Comment 2 Documented in Chart      Studies/Results: US Renal  10/13/2011  *RADIOLOGY REPORT*  Clinical Data: Renal failure.  RENAL/URINARY TRACT ULTRASOUND COMPLETE  Comparison:  Ultrasound dated 09/19/2007 performed at [facility].  Findings:  Right Kidney:  The right kidney is atrophic, measuring 5.3 cm in length.  No hydronephrosis.  Renal cortex is markedly thinned.  Left Kidney:  Left kidney is hypertrophic with no hydronephrosis or mass.  Slight increased echogenicity of the renal parenchyma consistent with renal medical disease.   13.9 cm in length.  Bladder:  The bladder is empty.  Foley catheter in place.  IMPRESSION: Since the prior exam the right kidney is more atrophic and the left kidney is more hypertrophic.  The slight increase echogenicity of the renal parenchyma on the left is new since the prior study.  Original Report Authenticated By: Gwynn Burly, M.D.   Portable  Chest 1 View  10/12/2011  *RADIOLOGY REPORT*  Clinical Data: Shortness of breath.  PORTABLE CHEST - 1 VIEW 10/12/2011 0006 hours:  Comparison: Portable chest x-ray yesterday, 09/25/2011, and two- view chest x-ray 09/24/2011 [facility].  Findings: Left jugular central venous catheter tip in the upper SVC.  No evidence of pneumothorax mediastinal hematoma.  Cardiac silhouette enlarged.  Pulmonary venous hypertension and mild asymmetric interstitial pulmonary edema, left greater than right. Small bilateral pleural effusions, left greater than right.  Dense consolidation in the left lower lobe, unchanged.  IMPRESSION: New asymmetric mild interstitial pulmonary edema, left greater than right.  Stable bilateral pleural effusions, left greater than right.  Stable dense left lower lobe atelectasis and/or pneumonia.  Original Report Authenticated By: Arnell Sieving, M.D.    Medications: I have reviewed the patient's current medications.   Patient Active Hospital Problem List: ARF (acute respiratory failure) (10/11/2011)   Assessment: FOllowed by renal service.   Plan:  Pleural effusion, bilateral (10/11/2011)   Assessment:    Plan:  Hyperkalemia (10/11/2011)   Assessment:    Plan:  AKI (acute kidney injury) (10/11/2011)   Assessment:    Plan: CKD (chronic kidney disease) stage 5, GFR less than 15 ml/min (10/11/2011)   Assessment:   Plan: DM (diabetes mellitus) (10/11/2011)   Assessment:    Plan:  COPD with acute exacerbation (10/11/2011)   Assessment:    Plan:  HTN (hypertension), malignant (10/11/2011)   Assessment:  BP is rel good control   Plan:  Anemia (10/11/2011)   Assessment:   Plan:  Cardiac enzymes elevated (10/12/2011)   Assessment: Echo today shows normal LV systolic function.  MIld MR.  Patient will sign release of info to  Get records [facility]   Plan:    LOS: 2 days   Dietrich Pates 10/13/2011, 2:05 PM

## 2011-10-13 NOTE — Progress Notes (Signed)
Have fax request for pts. Files from [facility] today 12/10

## 2011-10-13 NOTE — Progress Notes (Signed)
*  PRELIMINARY RESULTS* Echocardiogram 2D Echocardiogram has been performed.  Glean Salen [facility] 10/13/2011, 11:00 AM

## 2011-10-13 NOTE — Progress Notes (Signed)
Subjective: Admit hx, consult, and progress notes reviewed in detail.  The patient reports improvement in her SOB, but she is not yet back to her baseline.  She denies f/c, n/v, abdom pain, or chest pain.    Objective: Weight change: 0.8 kg (1 lb 12.2 oz)  Intake/Output Summary (Last 24 hours) at 10/13/11 1211 Last data filed at 10/13/11 0800  Gross per 24 hour  Intake    626 ml  Output   1750 ml  Net  -1124 ml   Blood pressure 129/55, pulse 93, temperature 98 F (36.7 C), temperature source Oral, resp. rate 18, height 5\' 5"  (1.651 m), weight 74.9 kg (165 lb 2 oz), SpO2 92.00%. Temp:  [97.7 F (36.5 C)-98.7 F (37.1 C)] 98 F (36.7 C) (12/10 0800) Pulse Rate:  [90-101] 93  (12/10 1104) Resp:  [15-23] 18  (12/10 0800) BP: (120-149)/(52-64) 129/55 mmHg (12/10 1101) SpO2:  [92 %-98 %] 92 % (12/10 0800) FiO2 (%):  [100 %] 100 % (12/10 0805) Weight:  [74.9 kg (165 lb 2 oz)] 165 lb 2 oz (74.9 kg) (12/10 0500)  Physical Exam: General: No acute respiratory distress but requiring FM to keep sats >90% Lungs: Clear to auscultation bilaterally with exception to mild exp wheeze and bibasilar crackles Cardiovascular: Regular rate and rhythm without murmur gallop or rub normal S1 and S2 Abdomen: Nontender, nondistended, soft, bowel sounds positive, no rebound, no ascites, no appreciable mass Extremities: No significant cyanosis, or clubbing, 1+ edema bilateral lower extremities  Lab Results:  Basename 10/13/11 0410 10/12/11 0740 10/12/11 0100  NA 135 131* 129*  K 4.2 5.0 5.5*  CL 90* 89* 87*  CO2 34* 28 25  GLUCOSE 231* 464* 492*  BUN 79* 73* 75*  CREATININE 5.19* 4.84* 5.16*  CALCIUM 8.1* 8.7 9.2  MG -- -- 2.4  PHOS 6.8* -- 5.3*    Basename 10/13/11 0410 10/12/11 0740 10/12/11 0100  AST -- 17 17  ALT -- 33 37*  ALKPHOS -- 203* 216*  BILITOT -- 0.2* 0.2*  PROT -- 6.6 6.9  ALBUMIN 2.4* 2.5* --   No results found for this basename: LIPASE:2,AMYLASE:2 in the last 72  hours  Basename 10/13/11 0410 10/12/11 0740 10/12/11 0100  WBC 12.7* 9.8 10.1  NEUTROABS -- -- 9.8*  HGB 7.7* 8.0* 8.6*  HCT 23.6* 24.8* 27.2*  MCV 80.8 80.8 81.9  PLT 313 261 295    Basename 10/12/11 1509 10/12/11 0740 10/12/11 0100  CKTOTAL 45 36 26  CKMB 4.6* 4.0 2.9  CKMBINDEX -- -- --  TROPONINI 4.03* 3.13* <0.30   No components found with this basename: POCBNP:3 No results found for this basename: DDIMER:2 in the last 72 hours  Basename 10/12/11 0100  HGBA1C 10.3*   No results found for this basename: CHOL:2,HDL:2,LDLCALC:2,TRIG:2,CHOLHDL:2,LDLDIRECT:2 in the last 72 hours  Basename 10/12/11 0100  TSH 1.664  T4TOTAL --  T3FREE --  THYROIDAB --    Basename 10/12/11 0740  VITAMINB12 245  FOLATE >20.0  FERRITIN 172  TIBC 242*  IRON 36*  RETICCTPCT 1.0    Micro Results: Recent Results (from the past 240 hour(s))  MRSA PCR SCREENING     Status: Normal   Collection Time   10/11/11  9:25 PM      Component Value Range Status Comment   MRSA by PCR NEGATIVE  NEGATIVE  Final     Studies/Results: Scheduled Meds:   . ipratropium  0.5 mg Nebulization Q4H   And  . albuterol  2.5 mg  Nebulization Q4H  . amLODipine  10 mg Oral QHS  . aspirin  81 mg Oral Daily  . cloNIDine  0.1 mg Oral TID  . clopidogrel  75 mg Oral Q breakfast  . docusate sodium  100 mg Oral BID  . furosemide  160 mg Intravenous Q8H  . guaiFENesin  600 mg Oral BID  . hydrALAZINE  50 mg Oral Q8H  . insulin aspart  0-20 Units Subcutaneous TID WC  . insulin aspart  0-5 Units Subcutaneous QHS  . insulin glargine  30 Units Subcutaneous Q0700  . insulin glargine  5 Units Subcutaneous QHS  . isosorbide mononitrate  120 mg Oral Daily  . levothyroxine  25 mcg Oral QAC breakfast  . metoprolol  50 mg Oral BID  . moxifloxacin  400 mg Intravenous Q24H  . nitroGLYCERIN  1 inch Topical Q6H  . pantoprazole  40 mg Oral Q1200  . predniSONE  40 mg Oral 1 day or 1 dose  . sodium bicarbonate  650 mg Oral TID   . sodium chloride      . sodium chloride      . sodium chloride      . DISCONTD: furosemide  40 mg Intravenous Q24H  . DISCONTD: furosemide  80 mg Intravenous Q6H  . DISCONTD: insulin aspart  0-15 Units Subcutaneous TID WC  . DISCONTD: insulin aspart  0-5 Units Subcutaneous QHS  . DISCONTD: nitroGLYCERIN  1 inch Topical Q6H   Continuous Infusions:  PRN Meds:.sodium chloride, acetaminophen, acetaminophen, nitroGLYCERIN, ondansetron (ZOFRAN) IV, ondansetron, oxyCODONE  Assessment/Plan:  acute respiratory failure/pulm edema/volume overload - remain in step down unit-continue to target a negative balance with ongoing Lasix dosing-followup chest x-ray in a.m.  Pleural effusion, bilateral- as discussed above  + troponin - asymptomatic-no further evaluation planned-felt to be due to acute renal failure  Hyperkalemia- resolved   AKI (acute kidney injury)- unfortunately the patient's renal function appears to be worsening-nephrology is following-I suspect the patient will require dialysis for effective volume  CKD (chronic kidney disease) stage 5, GFR less than 15 ml/min- baseline crt 2.0 per Nephrology - has one known atrophic kidney   DM (diabetes mellitus)- poorly controlled - we will adjust our treatment plan  COPD with acute exacerbation- no wheezing on exam today-I suspect this is primarily related to edema  anemia- likely anemia of chronic kidney disease-follow hemoglobin  PVD  HTN - reasonably well controlled at present   Recent PNA - tx in local outlying hospital   Possible B12 deficiency - check homocysteine level as MMA surrogate   LOS: 2 days   Jhoan Schmieder T 10/13/2011, 12:11 PM

## 2011-10-13 NOTE — Progress Notes (Signed)
Inpatient Diabetes Program Recommendations  AACE/ADA: New Consensus Statement on Inpatient Glycemic Control (2009)  Target Ranges:  Prepandial:   less than 140 mg/dL      Peak postprandial:   less than 180 mg/dL (1-2 hours)      Critically ill patients:  140 - 180 mg/dL   Reason for Visit: Elevated CBG's.  Inpatient Diabetes Program Recommendations Insulin - Basal: Continue to titrate Lantus up based on fasting CBG. Insulin - Meal Coverage: May also benefit from the addition of Novolog meal coverage 6 units tid with meals (hold if patient eats < 50%)  to prevent hyperglycemia post-prandial. HgbA1C: A1C=10.3%.  Note: Attempted to speak to patient regarding home diabetes regimen.  Patient sleeping at this time.  Will attempt to see patient 10/14/11.

## 2011-10-14 ENCOUNTER — Inpatient Hospital Stay (HOSPITAL_COMMUNITY): Payer: Medicare Other

## 2011-10-14 DIAGNOSIS — J9600 Acute respiratory failure, unspecified whether with hypoxia or hypercapnia: Secondary | ICD-10-CM

## 2011-10-14 DIAGNOSIS — J810 Acute pulmonary edema: Secondary | ICD-10-CM

## 2011-10-14 DIAGNOSIS — R748 Abnormal levels of other serum enzymes: Secondary | ICD-10-CM

## 2011-10-14 DIAGNOSIS — E119 Type 2 diabetes mellitus without complications: Secondary | ICD-10-CM

## 2011-10-14 DIAGNOSIS — N179 Acute kidney failure, unspecified: Secondary | ICD-10-CM

## 2011-10-14 LAB — BLOOD GAS, ARTERIAL
Bicarbonate: 33.1 mEq/L — ABNORMAL HIGH (ref 20.0–24.0)
O2 Saturation: 86.1 %
Patient temperature: 98.6
TCO2: 34.9 mmol/L (ref 0–100)

## 2011-10-14 LAB — URINE CULTURE

## 2011-10-14 LAB — BASIC METABOLIC PANEL
BUN: 92 mg/dL — ABNORMAL HIGH (ref 6–23)
CO2: 33 mEq/L — ABNORMAL HIGH (ref 19–32)
Calcium: 7.5 mg/dL — ABNORMAL LOW (ref 8.4–10.5)
Creatinine, Ser: 5.54 mg/dL — ABNORMAL HIGH (ref 0.50–1.10)
Glucose, Bld: 277 mg/dL — ABNORMAL HIGH (ref 70–99)
Sodium: 131 mEq/L — ABNORMAL LOW (ref 135–145)

## 2011-10-14 LAB — CBC
MCH: 25.4 pg — ABNORMAL LOW (ref 26.0–34.0)
MCHC: 31.3 g/dL (ref 30.0–36.0)
MCV: 81 fL (ref 78.0–100.0)
Platelets: 384 10*3/uL (ref 150–400)
RBC: 3.11 MIL/uL — ABNORMAL LOW (ref 3.87–5.11)

## 2011-10-14 LAB — GLUCOSE, CAPILLARY
Glucose-Capillary: 194 mg/dL — ABNORMAL HIGH (ref 70–99)
Glucose-Capillary: 184 mg/dL — ABNORMAL HIGH (ref 70–99)
Glucose-Capillary: 110 mg/dL — ABNORMAL HIGH (ref 70–99)

## 2011-10-14 LAB — PARATHYROID HORMONE, INTACT (NO CA): PTH: 585.9 pg/mL — ABNORMAL HIGH (ref 14.0–72.0)

## 2011-10-14 MED ORDER — ALPRAZOLAM 0.25 MG PO TABS
0.2500 mg | ORAL_TABLET | Freq: Three times a day (TID) | ORAL | Status: DC | PRN
  Administered 2011-10-14 – 2011-10-21 (×3): 0.25 mg via ORAL
  Filled 2011-10-14 (×2): qty 1

## 2011-10-14 MED ORDER — RENA-VITE PO TABS
1.0000 | ORAL_TABLET | Freq: Every day | ORAL | Status: DC
  Administered 2011-10-17 – 2011-10-31 (×15): 1 via ORAL
  Filled 2011-10-14 (×19): qty 1

## 2011-10-14 MED ORDER — HEPARIN SODIUM (PORCINE) 5000 UNIT/ML IJ SOLN
5000.0000 [IU] | Freq: Three times a day (TID) | INTRAMUSCULAR | Status: DC
  Administered 2011-10-15: 5000 [IU] via SUBCUTANEOUS
  Filled 2011-10-14 (×4): qty 1

## 2011-10-14 MED ORDER — LEVOTHYROXINE SODIUM 25 MCG PO TABS
25.0000 ug | ORAL_TABLET | Freq: Every day | ORAL | Status: DC
  Administered 2011-10-18 – 2011-11-01 (×14): 25 ug via ORAL
  Filled 2011-10-14 (×21): qty 1

## 2011-10-14 MED ORDER — METOLAZONE 10 MG PO TABS
10.0000 mg | ORAL_TABLET | Freq: Every day | ORAL | Status: DC
  Administered 2011-10-15 – 2011-10-20 (×4): 10 mg via ORAL
  Filled 2011-10-14 (×7): qty 1

## 2011-10-14 MED ORDER — INSULIN GLARGINE 100 UNIT/ML ~~LOC~~ SOLN
14.0000 [IU] | Freq: Every day | SUBCUTANEOUS | Status: DC
  Administered 2011-10-14 – 2011-10-30 (×15): 14 [IU] via SUBCUTANEOUS

## 2011-10-14 MED ORDER — CIPROFLOXACIN HCL 500 MG PO TABS
500.0000 mg | ORAL_TABLET | Freq: Every day | ORAL | Status: DC
  Administered 2011-10-14: 500 mg via ORAL
  Filled 2011-10-14: qty 1

## 2011-10-14 MED ORDER — CALCIUM ACETATE 667 MG PO CAPS
667.0000 mg | ORAL_CAPSULE | Freq: Three times a day (TID) | ORAL | Status: DC
  Administered 2011-10-14 – 2011-11-01 (×33): 667 mg via ORAL
  Filled 2011-10-14 (×56): qty 1

## 2011-10-14 MED ORDER — PREDNISONE 10 MG PO TABS
10.0000 mg | ORAL_TABLET | Freq: Every day | ORAL | Status: DC
  Filled 2011-10-14: qty 1

## 2011-10-14 MED ORDER — SODIUM CHLORIDE 0.9 % IJ SOLN
INTRAMUSCULAR | Status: AC
  Administered 2011-10-14: 10 mL
  Filled 2011-10-14: qty 10

## 2011-10-14 MED ORDER — DEXTROSE 5 % IV SOLN
160.0000 mg | Freq: Once | INTRAVENOUS | Status: AC
  Administered 2011-10-14: 160 mg via INTRAVENOUS
  Filled 2011-10-14: qty 16

## 2011-10-14 MED ORDER — PREDNISOLONE 5 MG PO TABS: 10.0000 mg | ORAL_TABLET | Freq: Every day | ORAL | Status: DC

## 2011-10-14 MED ORDER — SODIUM CHLORIDE 0.9 % IJ SOLN
INTRAMUSCULAR | Status: AC
  Administered 2011-10-14: 04:00:00
  Filled 2011-10-14: qty 20

## 2011-10-14 MED ORDER — PANTOPRAZOLE SODIUM 40 MG IV SOLR
40.0000 mg | INTRAVENOUS | Status: DC
  Administered 2011-10-15 – 2011-10-20 (×6): 40 mg via INTRAVENOUS
  Filled 2011-10-14 (×7): qty 40

## 2011-10-14 NOTE — Progress Notes (Signed)
Patient ID: [name] [name], female   DOB: [birth year], 70 y.o.   MRN: [id number] SUBJECTIVE: Patient is not in any chest pain. She is sitting upright in bed.   Filed Vitals:   10/14/11 0500 10/14/11 0557 10/14/11 0800 10/14/11 0910  BP:  135/58 137/60   Pulse:   80   Temp:   98.1 F (36.7 C)   TempSrc:   Axillary   Resp:   15   Height:      Weight: 165 lb 12.6 oz (75.2 kg)     SpO2:   92% 95%    Intake/Output Summary (Last 24 hours) at 10/14/11 0937 Last data filed at 10/14/11 0800  Gross per 24 hour  Intake   1305 ml  Output   1625 ml  Net   -320 ml    LABS: Basic Metabolic Panel:  Basename 10/14/11 0420 10/13/11 0410 10/12/11 0100  NA 131* 135 --  K 4.0 4.2 --  CL 84* 90* --  CO2 33* 34* --  GLUCOSE 277* 231* --  BUN 92* 79* --  CREATININE 5.54* 5.19* --  CALCIUM 7.5* 8.1* --  MG -- -- 2.4  PHOS -- 6.8* 5.3*   Liver Function Tests:  Basename 10/13/11 0410 10/12/11 0740 10/12/11 0100  AST -- 17 17  ALT -- 33 37*  ALKPHOS -- 203* 216*  BILITOT -- 0.2* 0.2*  PROT -- 6.6 6.9  ALBUMIN 2.4* 2.5* --   No results found for this basename: LIPASE:2,AMYLASE:2 in the last 72 hours CBC:  Basename 10/14/11 0420 10/13/11 0410 10/12/11 0100  WBC 13.0* 12.7* --  NEUTROABS -- -- 9.8*  HGB 7.9* 7.7* --  HCT 25.2* 23.6* --  MCV 81.0 80.8 --  PLT 384 313 --   Cardiac Enzymes:  Basename 10/12/11 1509 10/12/11 0740 10/12/11 0100  CKTOTAL 45 36 26  CKMB 4.6* 4.0 2.9  CKMBINDEX -- -- --  TROPONINI 4.03* 3.13* <0.30   BNP: No components found with this basename: POCBNP:3 D-Dimer: No results found for this basename: DDIMER:2 in the last 72 hours Hemoglobin A1C:  Basename 10/12/11 0100  HGBA1C 10.3*   Fasting Lipid Panel: No results found for this basename: CHOL,HDL,LDLCALC,TRIG,CHOLHDL,LDLDIRECT in the last 72 hours Thyroid Function Tests:  Basename 10/12/11 0100  TSH 1.664  T4TOTAL --  T3FREE --  THYROIDAB --    RADIOLOGY: US Renal  10/13/2011  *RADIOLOGY  REPORT*  Clinical Data: Renal failure.  RENAL/URINARY TRACT ULTRASOUND COMPLETE  Comparison:  Ultrasound dated 09/19/2007 performed at [facility].  Findings:  Right Kidney:  The right kidney is atrophic, measuring 5.3 cm in length.  No hydronephrosis.  Renal cortex is markedly thinned.  Left Kidney:  Left kidney is hypertrophic with no hydronephrosis or mass.  Slight increased echogenicity of the renal parenchyma consistent with renal medical disease.   13.9 cm in length.  Bladder:  The bladder is empty.  Foley catheter in place.  IMPRESSION: Since the prior exam the right kidney is more atrophic and the left kidney is more hypertrophic.  The slight increase echogenicity of the renal parenchyma on the left is new since the prior study.  Original Report Authenticated By: Gwynn Burly, M.D.   Dg Chest Port 1 View  10/14/2011  *RADIOLOGY REPORT*  Clinical Data: Follow up edema and pleural effusion  PORTABLE CHEST - 1 VIEW  Comparison: 10/12/2011  Findings: Cardiomediastinal silhouette is stable.  Asymmetric mild interstitial edema left greater than right again noted without change in aeration.  Probable  small bilateral pleural effusion again noted.  Stable left IJ central line position.  No diagnostic pneumothorax.  IMPRESSION: Asymmetric mild interstitial edema left greater than right again noted without change in aeration.  Probable small bilateral pleural effusion again noted.  Stable left IJ central line position.  Original Report Authenticated By: Natasha Mead, M.D.   Portable Chest 1 View  10/12/2011  *RADIOLOGY REPORT*  Clinical Data: Shortness of breath.  PORTABLE CHEST - 1 VIEW 10/12/2011 0006 hours:  Comparison: Portable chest x-ray yesterday, 09/25/2011, and two- view chest x-ray 09/24/2011 [facility]  Findings: Left jugular central venous catheter tip in the upper SVC.  No evidence of pneumothorax mediastinal hematoma.  Cardiac silhouette enlarged.  Pulmonary venous hypertension and  mild asymmetric interstitial pulmonary edema, left greater than right. Small bilateral pleural effusions, left greater than right.  Dense consolidation in the left lower lobe, unchanged.  IMPRESSION: New asymmetric mild interstitial pulmonary edema, left greater than right.  Stable bilateral pleural effusions, left greater than right.  Stable dense left lower lobe atelectasis and/or pneumonia.  Original Report Authenticated By: Arnell Sieving, M.D.    PHYSICAL EXAM  Patient is oriented to person time and place. Affect is normal. Lungs reveal a few scattered rhonchi. Cardiac exam reveals S1 and S2. There are no clicks or significant murmurs. The abdomen is soft. There is no significant peripheral edema.   TELEMETRY:    I have reviewed telemetry. There is sinus rhythm.   ASSESSMENT AND PLAN:  Principal Problem:  *ARF (acute respiratory failure) Active Problems:   Pleural effusion, bilateral   Hopefully pleural effusions will improve as her overall volume status stabilizes. She has good LV function by echo.   Hyperkalemia  AKI (acute kidney injury)  CKD (chronic kidney disease) stage 5, GFR less than 15 ml/min  DM (diabetes mellitus)  COPD with acute exacerbation  HTN (hypertension), malignant  Anemia   Cardiac enzymes elevated     At this point there is no plan for further workup of the enzymes. Patient has good LV function. Request has been put in for information from her cardiologist at home.   Willa Rough 10/14/2011 9:37 AM

## 2011-10-14 NOTE — Progress Notes (Signed)
At approximately 2130 tonight pt Sats dropped into the low 80's. Pt was slumped in bed but still had NRB on. Pt repositioned in bed and HOB at 45 degrees. Pt had some increased WOB and stated that she felt more SOB than before. RT at bedside at this time, breathing tx administered and NRB mask placed back on. Pt stayed in the mid 80's. MD Doutova notified of pt condition. Order for a chest xray and ABG completed at this time. BP 136/95 HR85 and RR 17. Rapid response called as well. Pt CBG at this time was 209 and pt remained arouseable throughout entire episode. Pt lungs sounds are clear/dem on top, diminished in the bases, which is the same as intital assessment at 2000.   At approximately 2215 MD Doutova notified of critical CO2 and O2 labs. Md to consult CCM. Pt placed on bipap at this time by RT per MD order. 160 mg IV lasix administered per MD order and scheduled lantus and novolog given.  2315- Pt is relaxed at this time and states that she feels a little better. Pt Sats 89-93% at this time on 100% Bipap. She is still arouseable and able to answer questions/follow commands. Family has been called and update by myself and CCM MD. Will continue to monitor.  0050 Pt tx to 2113, RN aware and at bedside. Report given previously.

## 2011-10-14 NOTE — Consult Note (Signed)
Name: [name] [name] MRN: [id number] DOB: 09/20/1941    LOS: 3  PCCM CONSULTATION NOTE  History of Present Illness: 70 yo WM with CKD secondary to atrophic kidney transferred from [facility] ED on 12/08, where she presented with progressive weakness.  She was treated with diuretics since admission but despite that developed increased oxygen requirements requiring NIMV and ICU transfer.  PCCM was consulted to assist in management.  Lines / Drains: 12/08  L IJ TLC>>> 12/08  Foley>>>  Cultures: 12/09  UC>>>E.Coli resistant to Ciprofloxacin (ESBL)  Antibiotics: 12/08  Ciprofloxacin (UTI)>>>12/11 12/11  Imipenem>>>  Tests / Events: 12/11  PCXR>>>bilateral effusions, interstitial infiltrates 12/10  Renal US>>>Since the prior exam the right kidney is more atrophic and the left kidney is more hypertrophic. The slight increase echogenicity of the renal parenchyma on the left is new since the prior study. 12/10  TTE>>>EF 55% without regional wall motion abnormalities, mild MR, PAP 34 torr, trivial effusion  The patient is on NIMV and unable to provide history, which was obtained for available medical records.    Past Medical History  Diagnosis Date  . Diabetes mellitus   . Hypercholesterolemia   . Hypothyroidism   . Colitis   . Chronic kidney disease   . Coronary artery disease   . Hypertension   . GERD (gastroesophageal reflux disease)   . Myocardial infarction   . Peripheral vascular disease   . COPD (chronic obstructive pulmonary disease)   . Pneumonia    Past Surgical History  Procedure Date  . Partial hysterectomy   . Tubal ligation   . Amputaton of right fifth toe   . Tonsillectomy    Prior to Admission medications   Medication Sig Start Date End Date Taking? Authorizing Provider  amLODipine (NORVASC) 10 MG tablet Take 10 mg by mouth daily.     Yes Historical Provider, MD  cloNIDine (CATAPRES) 0.2 MG tablet Take 0.2 mg by mouth 3 (three) times daily.     Yes Historical  Provider, MD  furosemide (LASIX) 40 MG tablet Take 40 mg by mouth 2 (two) times daily.     Yes Historical Provider, MD  hydrALAZINE (APRESOLINE) 50 MG tablet Take 50 mg by mouth 2 (two) times daily.     Yes Historical Provider, MD  insulin glargine (LANTUS) 100 UNIT/ML injection Inject 1-100 Units into the skin at bedtime. Use as directed from pharmacy list.    Yes Historical Provider, MD  insulin regular (HUMULIN R,NOVOLIN R) 100 units/mL injection Inject 1-100 Units into the skin 3 (three) times daily before meals. Use as directed per pharmacy list.    Yes Historical Provider, MD  isosorbide mononitrate (IMDUR) 120 MG 24 hr tablet Take 120 mg by mouth daily.     Yes Historical Provider, MD  levothyroxine (SYNTHROID, LEVOTHROID) 25 MCG tablet Take 25 mcg by mouth daily.     Yes Historical Provider, MD  metoprolol (LOPRESSOR) 50 MG tablet Take 50 mg by mouth 2 (two) times daily.     Yes Historical Provider, MD  nitroGLYCERIN (NITROSTAT) 0.4 MG SL tablet Place 0.4 mg under the tongue every 5 (five) minutes as needed. For chest pain.    Yes Historical Provider, MD  pantoprazole (PROTONIX) 40 MG tablet Take 40 mg by mouth daily.     Yes Historical Provider, MD  potassium chloride SA (K-DUR,KLOR-CON) 20 MEQ tablet Take 20 mEq by mouth 2 (two) times daily.     Yes Historical Provider, MD  ranitidine (ZANTAC) 150 MG tablet Take  150 mg by mouth 2 (two) times daily.     Yes Historical Provider, MD   Allergies Allergies  Allergen Reactions  . Novocain Other (See Comments)    Unknown   Family History No family history on file.  Social History  reports that she quit smoking about 4 months ago. Her smoking use included Cigarettes. She has a 40 pack-year smoking history. She does not have any smokeless tobacco history on file. She reports that she does not drink alcohol or use illicit drugs.  Review Of Systems  11 points review of systems is negative with an exception of listed in HPI.  Vital  Signs: Temp:  [96.3 F (35.7 C)-98.4 F (36.9 C)] 97.4 F (36.3 C) (12/11 2000) Pulse Rate:  [75-89] 85  (12/11 2216) Resp:  [15-19] 19  (12/11 2000) BP: (134-153)/(58-95) 136/95 mmHg (12/11 2216) SpO2:  [78 %-100 %] 88 % (12/11 2216) FiO2 (%):  [100 %] 100 % (12/11 1600) Weight:  [75.2 kg (165 lb 12.6 oz)] 165 lb 12.6 oz (75.2 kg) (12/11 0500) I/O last 3 completed shifts: In: 2051 [P.O.:1757; I.V.:30; IV Piggyback:264] Out: 2525 [Urine:2525]  Physical Examination: General:  Im mild distress, but appears tolerating NIMV well Neuro:  Awake, alert, oriented   HEENT:  PERRL, moist membranes, BiPAP mask on Neck:  Could not evaluate for JVD, L IJ TLC   Cardiovascular:  RRR, no murmurs Lungs:  Diminished air entry, bibasilar rales, no wheezing Abdomen:  Soft, non tender, bowel sounds present Musculoskeletal:  Trace edema Skin:  Intact   Ventilator settings: Vent Mode:  [-]  FiO2 (%):  [100 %] 100 %  Labs and Imaging:  Reviewed.  Please refer to the Assessment and Plan section for relevant results.  Assessment and Plan:  Sub-acute hypoxemic respiratory failure secondary to pulmonary edema / fluid overload.  Doubt significant chronic component as normal HCO3 on admission. Doubt effusions are significant impairment to her respiratory status.  No evidence of bronchospasm. Even if she does have COPD, this is not the picture of COPD exacerbation.  Lab 10/14/11 2146 10/11/11 2351  PHART 7.373 7.307*  PCO2ART 58.4* 48.5*  PO2ART 52.6* 120.0*  HCO3 33.1* 23.5  TCO2 34.9 25.0  O2SAT 86.1 98.5  -->NIMV/BiPAP, goal SpO2>92% -->d/c Prednisone  Worsening CKD secondary to atrophic kideney, DM, HTN.  Marginally negative fluid balance in spite of large doses of diuretics.  Renal following, considering HD.  Resolved hyperkalemia.  Lab 10/14/11 0420 10/13/11 0410 10/12/11 0740 10/12/11 0100  CREATININE 5.54* 5.19* 4.84* 5.16*    Lab 10/14/11 0420 10/13/11 0410 10/12/11 0740 10/12/11 0100   K 4.0 4.2 5.0 5.5*  -->start Metolazone -->d/c Lasix -->start Bumex gtt, goal output 75-100 cc/h -->may need HD -->d/c bicarbonate as trending up  UTI with ESBL E. Coli resistant to Ciprofloxacin -->d/c Ciprofloxacin -->start Primaxin per pharmacy  Diabetes mellitus  Lab 10/14/11 2143 10/14/11 1731 10/14/11 1218 10/14/11 0849 10/13/11 2108  GLUCAP 209* 110* 184* 194* 325*  -->BCBGM+SSI -->d/c Lantus as NPO  Hypertension / CAD.  No active ischemia / arrhythmia. -->continue ASA, Plavix, Amlodipine, Clonidine, Hydralazine, Imdur, Metoprolol  Anemia, multifactorial.  Lab 10/14/11 0420 10/13/11 0410 10/12/11 0740 10/12/11 0100  HCT 25.2* 23.6* 24.8* 27.2*  -->trend Hct  Hypothyroidism -->Levothyroxin  Best practices / Disposition -->ICU status under PCCM -->full code -->Heparin for DVT Px -->Protonix for GI Px -->NPO for now -->family updated over the phone  The patient is critically ill with multiple organ systems failure and requires high  complexity decision making for assessment and support, frequent evaluation and titration of therapies, application of advanced monitoring technologies and extensive interpretation of multiple databases. Critical Care Time devoted to patient care services described in this note is 45 minutes.  Orlean Bradford, M.D. Pulmonary and Critical Care Medicine [facility] Cell: [phone] Pager: [phone]  10/14/2011, 10:51 PM

## 2011-10-14 NOTE — Progress Notes (Signed)
Subjective: Feels no change in breathing. No new complaints. Still comfortable on 100% Fio2.    Objective: Weight change: 0.3 kg (10.6 oz)  Intake/Output Summary (Last 24 hours) at 10/14/11 1218 Last data filed at 10/14/11 0800  Gross per 24 hour  Intake   1005 ml  Output   1275 ml  Net   -270 ml   Blood pressure 137/60, pulse 80, temperature 98.1 F (36.7 C), temperature source Axillary, resp. rate 15, height 5\' 5"  (1.651 m), weight 75.2 kg (165 lb 12.6 oz), SpO2 95.00%. Temp:  [96.3 F (35.7 C)-98.1 F (36.7 C)] 98.1 F (36.7 C) (12/11 0800) Pulse Rate:  [80-93] 80  (12/11 0800) Resp:  [13-16] 15  (12/11 0800) BP: (112-143)/(53-69) 137/60 mmHg (12/11 0800) SpO2:  [92 %-96 %] 95 % (12/11 0910) FiO2 (%):  [100 %] 100 % (12/11 0910) Weight:  [75.2 kg (165 lb 12.6 oz)] 165 lb 12.6 oz (75.2 kg) (12/11 0500)  Physical Exam: General: No acute respiratory distress but requiring FM to keep sats >90% Lungs:Clear with very decreased breath sounds.  Cardiovascular: Regular rate and rhythm without murmur gallop or rub normal S1 and S2 Abdomen: Nontender, nondistended, soft, bowel sounds positive, no rebound, no ascites, no appreciable mass Extremities: No significant cyanosis, or clubbing, 1+ edema bilateral lower extremities  Lab Results:  Basename 10/14/11 0420 10/13/11 0410 10/12/11 0740 10/12/11 0100  NA 131* 135 131* --  K 4.0 4.2 5.0 --  CL 84* 90* 89* --  CO2 33* 34* 28 --  GLUCOSE 277* 231* 464* --  BUN 92* 79* 73* --  CREATININE 5.54* 5.19* 4.84* --  CALCIUM 7.5* 8.1* 8.7 --  MG -- -- -- 2.4  PHOS -- 6.8* -- 5.3*    Basename 10/13/11 0410 10/12/11 0740 10/12/11 0100  AST -- 17 17  ALT -- 33 37*  ALKPHOS -- 203* 216*  BILITOT -- 0.2* 0.2*  PROT -- 6.6 6.9  ALBUMIN 2.4* 2.5* --   No results found for this basename: LIPASE:2,AMYLASE:2 in the last 72 hours  Basename 10/14/11 0420 10/13/11 0410 10/12/11 0740 10/12/11 0100  WBC 13.0* 12.7* 9.8 --  NEUTROABS -- --  -- 9.8*  HGB 7.9* 7.7* 8.0* --  HCT 25.2* 23.6* 24.8* --  MCV 81.0 80.8 80.8 --  PLT 384 313 261 --    Basename 10/12/11 1509 10/12/11 0740 10/12/11 0100  CKTOTAL 45 36 26  CKMB 4.6* 4.0 2.9  CKMBINDEX -- -- --  TROPONINI 4.03* 3.13* <0.30   No components found with this basename: POCBNP:3 No results found for this basename: DDIMER:2 in the last 72 hours  Basename 10/12/11 0100  HGBA1C 10.3*   No results found for this basename: CHOL:2,HDL:2,LDLCALC:2,TRIG:2,CHOLHDL:2,LDLDIRECT:2 in the last 72 hours  Basename 10/12/11 0100  TSH 1.664  T4TOTAL --  T3FREE --  THYROIDAB --    Basename 10/12/11 0740  VITAMINB12 245  FOLATE >20.0  FERRITIN 172  TIBC 242*  IRON 36*  RETICCTPCT 1.0    Micro Results: Recent Results (from the past 240 hour(s))  MRSA PCR SCREENING     Status: Normal   Collection Time   10/11/11  9:25 PM      Component Value Range Status Comment   MRSA by PCR NEGATIVE  NEGATIVE  Final   URINE CULTURE     Status: Normal (Preliminary result)   Collection Time   10/12/11  3:18 PM      Component Value Range Status Comment   Specimen Description URINE,  CATHETERIZED   Final    Special Requests NONE   Final    Setup Time [id number]   Final    Colony Count >=100,000 COLONIES/ML   Final    Culture ESCHERICHIA COLI   Final    Report Status PENDING   Incomplete     Studies/Results: Scheduled Meds:    . ipratropium  0.5 mg Nebulization TID   And  . albuterol  2.5 mg Nebulization TID  . amLODipine  10 mg Oral QHS  . aspirin  81 mg Oral Daily  . cloNIDine  0.1 mg Oral TID  . clopidogrel  75 mg Oral Q breakfast  . docusate sodium  100 mg Oral BID  . furosemide  160 mg Intravenous Q8H  . guaiFENesin  600 mg Oral BID  . hydrALAZINE  50 mg Oral Q8H  . insulin aspart  0-20 Units Subcutaneous TID WC  . insulin aspart  0-5 Units Subcutaneous QHS  . insulin glargine  10 Units Subcutaneous QHS  . insulin glargine  40 Units Subcutaneous Q0700  . isosorbide  mononitrate  120 mg Oral Daily  . levothyroxine  25 mcg Oral QAC breakfast  . metoprolol  50 mg Oral BID  . nitroGLYCERIN  0.5 inch Topical Q6H  . pantoprazole  40 mg Oral Q1200  . predniSONE  20 mg Oral 1 day or 1 dose  . sodium bicarbonate  650 mg Oral TID  . sodium chloride      . sodium chloride      . sodium chloride      . DISCONTD: albuterol  2.5 mg Nebulization Q4H  . DISCONTD: albuterol  2.5 mg Nebulization TID  . DISCONTD: insulin glargine  30 Units Subcutaneous Q0700  . DISCONTD: insulin glargine  5 Units Subcutaneous QHS  . DISCONTD: ipratropium  0.5 mg Nebulization Q4H  . DISCONTD: ipratropium  0.5 mg Nebulization Q4H  . DISCONTD: moxifloxacin  400 mg Intravenous Q24H  . DISCONTD: nitroGLYCERIN  1 inch Topical Q6H  . DISCONTD: predniSONE  40 mg Oral 1 day or 1 dose   Continuous Infusions:  PRN Meds:.sodium chloride, acetaminophen, acetaminophen, nitroGLYCERIN, ondansetron (ZOFRAN) IV, ondansetron, oxyCODONE  Assessment/Plan:  acute respiratory failure/pulm edema/volume overload - remain in step down unit-continue to target a negative balance with ongoing Lasix dosing - repeat CXR is unchanged from prior. She is in negative balance by about 1.5 liters since admission.  - ECHO negative for systolic CHF but she has grade 2 diastolic dysfunction.   Pleural effusion, bilateral- as discussed above  + troponin - asymptomatic-no further evaluation planned-felt to be due to acute renal failure  Hyperkalemia- resolved   AKI (acute kidney injury)- unfortunately the patient's renal function appears to be worsening-nephrology is following-I suspect the patient will require dialysis for effective volume  CKD (chronic kidney disease) stage 5, GFR less than 15 ml/min- baseline crt 2.0 per Nephrology - has one known atrophic kidney   DM (diabetes mellitus)- better controlled today but still high- increase bedtime lantus to 14 units.  COPD with acute exacerbation- no wheezing on  exam today-I suspect this is primarily related to edema  anemia- likely anemia of chronic kidney disease-follow hemoglobin. Unable to transfuse at present with the volume overload.   PVD  HTN - reasonably well controlled at present   Recent PNA - tx in local outlying hospital   Possible B12 deficiency - check homocysteine level as MMA surrogate   LOS: 3 days   [facility] 10/14/2011, 12:18 PM

## 2011-10-14 NOTE — Progress Notes (Signed)
Inpatient Diabetes Program Recommendations  AACE/ADA: New Consensus Statement on Inpatient Glycemic Control (2009)  Target Ranges:  Prepandial:   less than 140 mg/dL      Peak postprandial:   less than 180 mg/dL (1-2 hours)      Critically ill patients:  140 - 180 mg/dL   Reason for Visit: Elevated CBG's and A1c.  Inpatient Diabetes Program Recommendations Insulin - Basal: CBG's improved with increased Lantus.   Patient states she took Lantus 30 units daily prior to admit. Insulin - Meal Coverage: May also benefit from the addition of Novolog meal coverage 6 units tid with meals (hold if patient eats < 50%)  to prevent hyperglycemia post-prandial. HgbA1C: Will likely need change in home diabetes regimen-A1C=10.3%.  Note:

## 2011-10-14 NOTE — Progress Notes (Signed)
S:Appetite decreased today. On non-rebreather. Says she has no idea why she was on prednisone O:BP 139/65  Pulse 77  Temp(Src) 98.4 F (36.9 C) (Oral)  Resp 17  Ht 5\' 5"  (1.651 m)  Wt 75.2 kg (165 lb 12.6 oz)  BMI 27.59 kg/m2  SpO2 93%  Intake/Output Summary (Last 24 hours) at 10/14/11 1609 Last data filed at 10/14/11 1200  Gross per 24 hour  Intake   1245 ml  Output    975 ml  Net    270 ml   Weight change: 0.3 kg (10.6 oz) YQM:VHQIO and alert CVS:RRR Resp:decreased BS bases, scattered wheezes and crackles Abd:+BS NTND no bruits Ext:no edema NEURO:Ox3 follows commands, no asterixis      . ipratropium  0.5 mg Nebulization TID   And  . albuterol  2.5 mg Nebulization TID  . amLODipine  10 mg Oral QHS  . aspirin  81 mg Oral Daily  . cloNIDine  0.1 mg Oral TID  . clopidogrel  75 mg Oral Q breakfast  . docusate sodium  100 mg Oral BID  . furosemide  160 mg Intravenous Q8H  . guaiFENesin  600 mg Oral BID  . hydrALAZINE  50 mg Oral Q8H  . insulin aspart  0-20 Units Subcutaneous TID WC  . insulin aspart  0-5 Units Subcutaneous QHS  . insulin glargine  14 Units Subcutaneous QHS  . insulin glargine  40 Units Subcutaneous Q0700  . isosorbide mononitrate  120 mg Oral Daily  . levothyroxine  25 mcg Oral QAC breakfast  . metoprolol  50 mg Oral BID  . pantoprazole  40 mg Oral Q1200  . predniSONE  10 mg Oral Q breakfast  . sodium bicarbonate  650 mg Oral TID  . sodium chloride      . sodium chloride      . DISCONTD: insulin glargine  10 Units Subcutaneous QHS  . DISCONTD: insulin glargine  30 Units Subcutaneous Q0700  . DISCONTD: insulin glargine  5 Units Subcutaneous QHS  . DISCONTD: moxifloxacin  400 mg Intravenous Q24H  . DISCONTD: nitroGLYCERIN  0.5 inch Topical Q6H  . DISCONTD: prednisoLONE  10 mg Oral Daily  . DISCONTD: predniSONE  20 mg Oral 1 day or 1 dose  . DISCONTD: predniSONE  40 mg Oral 1 day or 1 dose   US Renal  10/13/2011  *RADIOLOGY REPORT*  Clinical  Data: Renal failure.  RENAL/URINARY TRACT ULTRASOUND COMPLETE  Comparison:  Ultrasound dated 09/19/2007 performed at [facility].  Findings:  Right Kidney:  The right kidney is atrophic, measuring 5.3 cm in length.  No hydronephrosis.  Renal cortex is markedly thinned.  Left Kidney:  Left kidney is hypertrophic with no hydronephrosis or mass.  Slight increased echogenicity of the renal parenchyma consistent with renal medical disease.   13.9 cm in length.  Bladder:  The bladder is empty.  Foley catheter in place.  IMPRESSION: Since the prior exam the right kidney is more atrophic and the left kidney is more hypertrophic.  The slight increase echogenicity of the renal parenchyma on the left is new since the prior study.  Original Report Authenticated By: Gwynn Burly, M.D.   Dg Chest Port 1 View  10/14/2011  *RADIOLOGY REPORT*  Clinical Data: Follow up edema and pleural effusion  PORTABLE CHEST - 1 VIEW  Comparison: 10/12/2011  Findings: Cardiomediastinal silhouette is stable.  Asymmetric mild interstitial edema left greater than right again noted without change in aeration.  Probable small bilateral pleural effusion  again noted.  Stable left IJ central line position.  No diagnostic pneumothorax.  IMPRESSION: Asymmetric mild interstitial edema left greater than right again noted without change in aeration.  Probable small bilateral pleural effusion again noted.  Stable left IJ central line position.  Original Report Authenticated By: Natasha Mead, M.D.   BMET    Component Value Date/Time   NA 131* 10/14/2011 0420   K 4.0 10/14/2011 0420   CL 84* 10/14/2011 0420   CO2 33* 10/14/2011 0420   GLUCOSE 277* 10/14/2011 0420   BUN 92* 10/14/2011 0420   CREATININE 5.54* 10/14/2011 0420   CALCIUM 7.5* 10/14/2011 0420   GFRNONAA 7* 10/14/2011 0420   GFRAA 8* 10/14/2011 0420   CBC    Component Value Date/Time   WBC 13.0* 10/14/2011 0420   RBC 3.11* 10/14/2011 0420   HGB 7.9* 10/14/2011 0420   HCT  25.2* 10/14/2011 0420   PLT 384 10/14/2011 0420   MCV 81.0 10/14/2011 0420   MCH 25.4* 10/14/2011 0420   MCHC 31.3 10/14/2011 0420   RDW 16.6* 10/14/2011 0420   LYMPHSABS 0.2* 10/12/2011 0100   MONOABS 0.1 10/12/2011 0100   EOSABS 0.0 10/12/2011 0100   BASOSABS 0.0 10/12/2011 0100     Assessment:  1. 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 pulm edema 3. COPD 4. E COLI UTI 5. Anemia sec CKD and iron def 6.Sec HPTH   Plan: 1. Cont diuresis 2. I discussed the role of HD and will show her videos 3. IV iron when volume controlled and the start EPO 4. Start vit D 5. I suspect we are heading for HD 6. Cipro empirically for UTI til sens back 7. Daily labs 8. Start PO4 binder   Netanya Yazdani T

## 2011-10-14 NOTE — Progress Notes (Signed)
   CARE MANAGEMENT NOTE 10/14/2011  Patient:  Denapoli,Hend   Account Number:  [id number]  Date Initiated:  10/14/2011  Documentation initiated by:  [name]  Subjective/Objective Assessment:   PT WAS ADMITTED WITH SOB/ HYPERGLYCEMIA/HYPERKALEMIA     Action/Plan:   PROGRESSION OF CARE AND DISCHARGE PLANNING   Anticipated DC Date:  10/20/2011   Anticipated DC Plan:  HOME W HOME HEALTH SERVICES      DC Planning Services  CM consult      Choice offered to / List presented to:             Status of service:  In process, will continue to follow Medicare Important Message given?   (If response is "NO", the following Medicare IM given date fields will be blank) Date Medicare IM given:   Date Additional Medicare IM given:    Discharge Disposition:    Per UR Regulation:  Reviewed for med. necessity/level of care/duration of stay  Comments:  UR COMPLETED 10/14/2011 [name], RN, BSN [id number]  PT WAS ADMITTED WITH RF/ HYPERGLYCEMIA AND HYPERKALEMIA. PTA PT WAS AT HOME WITH SELF CARE.

## 2011-10-15 DIAGNOSIS — M79609 Pain in unspecified limb: Secondary | ICD-10-CM

## 2011-10-15 LAB — RENAL FUNCTION PANEL
Albumin: 2.3 g/dL — ABNORMAL LOW (ref 3.5–5.2)
GFR calc Af Amer: 8 mL/min — ABNORMAL LOW (ref >90)
Phosphorus: 8 mg/dL — ABNORMAL HIGH (ref 2.3–4.6)
Potassium: 3.6 mEq/L (ref 3.5–5.1)
Sodium: 133 mEq/L — ABNORMAL LOW (ref 135–145)

## 2011-10-15 LAB — CBC
Hemoglobin: 8.6 g/dL — ABNORMAL LOW (ref 12.0–15.0)
MCHC: 33 g/dL (ref 30.0–36.0)
RDW: 16.6 % — ABNORMAL HIGH (ref 11.5–15.5)

## 2011-10-15 LAB — GLUCOSE, CAPILLARY
Glucose-Capillary: 122 mg/dL — ABNORMAL HIGH (ref 70–99)
Glucose-Capillary: 79 mg/dL (ref 70–99)
Glucose-Capillary: 74 mg/dL (ref 70–99)
Glucose-Capillary: 65 mg/dL — ABNORMAL LOW (ref 70–99)

## 2011-10-15 MED ORDER — HEPARIN SOD (PORCINE) IN D5W 100 UNIT/ML IV SOLN
1100.0000 [IU]/h | INTRAVENOUS | Status: DC
  Administered 2011-10-15 – 2011-10-19 (×3): 1100 [IU]/h via INTRAVENOUS
  Filled 2011-10-15 (×7): qty 250

## 2011-10-15 MED ORDER — SODIUM CHLORIDE 0.9 % IV SOLN
INTRAVENOUS | Status: DC
  Administered 2011-10-15: 01:00:00 via INTRAVENOUS
  Filled 2011-10-15 (×19): qty 100

## 2011-10-15 MED ORDER — SODIUM CHLORIDE 0.9 % IV SOLN
250.0000 mg | Freq: Two times a day (BID) | INTRAVENOUS | Status: DC
  Administered 2011-10-15 – 2011-10-25 (×21): 250 mg via INTRAVENOUS
  Filled 2011-10-15 (×26): qty 250

## 2011-10-15 MED ORDER — HEPARIN BOLUS VIA INFUSION
3500.0000 [IU] | Freq: Once | INTRAVENOUS | Status: AC
  Administered 2011-10-15: 3500 [IU] via INTRAVENOUS
  Filled 2011-10-15: qty 3500

## 2011-10-15 MED ORDER — SODIUM CHLORIDE 0.9 % IV SOLN
250.0000 mg | INTRAVENOUS | Status: AC
  Administered 2011-10-15: 250 mg via INTRAVENOUS
  Filled 2011-10-15: qty 250

## 2011-10-15 MED ORDER — DEXTROSE 50 % IV SOLN
25.0000 mL | Freq: Once | INTRAVENOUS | Status: AC | PRN
  Administered 2011-10-15 – 2011-10-16 (×2): 25 mL via INTRAVENOUS
  Filled 2011-10-15: qty 50

## 2011-10-15 MED ORDER — SODIUM CHLORIDE 0.9 % IV SOLN: 1.0000 mg/h | INTRAVENOUS | Status: DC

## 2011-10-15 MED ORDER — MORPHINE SULFATE 2 MG/ML IJ SOLN
2.0000 mg | INTRAMUSCULAR | Status: DC | PRN
  Administered 2011-10-15: 2 mg via INTRAVENOUS
  Filled 2011-10-15: qty 1

## 2011-10-15 MED ORDER — SODIUM CHLORIDE 0.9 % IJ SOLN
INTRAMUSCULAR | Status: AC
  Administered 2011-10-15: 05:00:00
  Filled 2011-10-15: qty 10

## 2011-10-15 MED ORDER — SODIUM CHLORIDE 0.9 % IJ SOLN
INTRAMUSCULAR | Status: AC
  Administered 2011-10-15: 10 mL
  Filled 2011-10-15: qty 30

## 2011-10-15 NOTE — Progress Notes (Signed)
Spoke with patient and family extensively, after discussion, decided to make patient a LCB with no CPR/cardioversion/intubation.  Additional CC time of 30 minutes.

## 2011-10-15 NOTE — Progress Notes (Signed)
S:on BiPAP.  CO nausea O:BP 140/68  Pulse 93  Temp(Src) 98.3 F (36.8 C) (Oral)  Resp 19  Ht 5\' 5"  (1.651 m)  Wt 74.9 kg (165 lb 2 oz)  BMI 27.48 kg/m2  SpO2 94%  Intake/Output Summary (Last 24 hours) at 10/15/11 0906 Last data filed at 10/15/11 0700  Gross per 24 hour  Intake    580 ml  Output   1725 ml  Net  -1145 ml   Weight change: -0.3 kg (-10.6 oz) ZOX:WRUEA and alert CVS:RRR Resp:decreased BS bases,bil crackles Abd:+BS NTND no bruits Ext:no edema NEURO:Ox3 follows commands, no asterixis      . ipratropium  0.5 mg Nebulization TID   And  . albuterol  2.5 mg Nebulization TID  . amLODipine  10 mg Oral QHS  . aspirin  81 mg Oral Daily  . calcium acetate  667 mg Oral TID WC  . cloNIDine  0.1 mg Oral TID  . clopidogrel  75 mg Oral Q breakfast  . docusate sodium  100 mg Oral BID  . furosemide  160 mg Intravenous Once  . guaiFENesin  600 mg Oral BID  . heparin subcutaneous  5,000 Units Subcutaneous Q8H  . hydrALAZINE  50 mg Oral Q8H  . imipenem-cilastatin  250 mg Intravenous NOW  . imipenem-cilastatin  250 mg Intravenous Q12H  . insulin aspart  0-20 Units Subcutaneous TID WC  . insulin aspart  0-5 Units Subcutaneous QHS  . insulin glargine  14 Units Subcutaneous QHS  . isosorbide mononitrate  120 mg Oral Daily  . levothyroxine  25 mcg Oral Q0600  . metolazone  10 mg Oral Daily  . metoprolol  50 mg Oral BID  . multivitamin  1 tablet Oral QHS  . pantoprazole (PROTONIX) IV  40 mg Intravenous Q24H  . sodium chloride      . sodium chloride      . sodium chloride      . DISCONTD: ciprofloxacin  500 mg Oral Daily  . DISCONTD: furosemide  160 mg Intravenous Q8H  . DISCONTD: insulin glargine  10 Units Subcutaneous QHS  . DISCONTD: insulin glargine  40 Units Subcutaneous Q0700  . DISCONTD: levothyroxine  25 mcg Oral QAC breakfast  . DISCONTD: nitroGLYCERIN  0.5 inch Topical Q6H  . DISCONTD: pantoprazole  40 mg Oral Q1200  . DISCONTD: prednisoLONE  10 mg Oral Daily    . DISCONTD: predniSONE  10 mg Oral Q breakfast  . DISCONTD: predniSONE  20 mg Oral 1 day or 1 dose  . DISCONTD: sodium bicarbonate  650 mg Oral TID   US Renal  10/13/2011  *RADIOLOGY REPORT*  Clinical Data: Renal failure.  RENAL/URINARY TRACT ULTRASOUND COMPLETE  Comparison:  Ultrasound dated 09/19/2007 performed at [facility]  Findings:  Right Kidney:  The right kidney is atrophic, measuring 5.3 cm in length.  No hydronephrosis.  Renal cortex is markedly thinned.  Left Kidney:  Left kidney is hypertrophic with no hydronephrosis or mass.  Slight increased echogenicity of the renal parenchyma consistent with renal medical disease.   13.9 cm in length.  Bladder:  The bladder is empty.  Foley catheter in place.  IMPRESSION: Since the prior exam the right kidney is more atrophic and the left kidney is more hypertrophic.  The slight increase echogenicity of the renal parenchyma on the left is new since the prior study.  Original Report Authenticated By: Gwynn Burly, M.D.   Dg Chest Port 1 View  10/14/2011  *RADIOLOGY REPORT*  Clinical Data: Respiratory distress  PORTABLE CHEST - 1 VIEW  Comparison: Multiple recent previous exams.  Findings: 2206 hours. The cardiopericardial silhouette is enlarged. Left greater than right interstitial and basilar airspace disease persist.  Probable small bilateral pleural effusions.  Right IJ central line tip is stable, positioned the proximal SVC level. Telemetry leads overlie the chest.  IMPRESSION: Stable exam.  Cardiomegaly with asymmetric left greater than right interstitial and basilar airspace opacities.  Original Report Authenticated By: ERIC A. MANSELL, M.D.   Dg Chest Port 1 View  10/14/2011  *RADIOLOGY REPORT*  Clinical Data: Follow up edema and pleural effusion  PORTABLE CHEST - 1 VIEW  Comparison: 10/12/2011  Findings: Cardiomediastinal silhouette is stable.  Asymmetric mild interstitial edema left greater than right again noted without change in  aeration.  Probable small bilateral pleural effusion again noted.  Stable left IJ central line position.  No diagnostic pneumothorax.  IMPRESSION: Asymmetric mild interstitial edema left greater than right again noted without change in aeration.  Probable small bilateral pleural effusion again noted.  Stable left IJ central line position.  Original Report Authenticated By: Natasha Mead, M.D.   BMET    Component Value Date/Time   NA 133* 10/15/2011 0500   K 3.6 10/15/2011 0500   CL 85* 10/15/2011 0500   CO2 34* 10/15/2011 0500   GLUCOSE 172* 10/15/2011 0500   BUN 100* 10/15/2011 0500   CREATININE 5.83* 10/15/2011 0500   CALCIUM 7.7* 10/15/2011 0500   GFRNONAA 7* 10/15/2011 0500   GFRAA 8* 10/15/2011 0500   CBC    Component Value Date/Time   WBC 11.2* 10/15/2011 0500   RBC 3.21* 10/15/2011 0500   HGB 8.6* 10/15/2011 0500   HCT 26.1* 10/15/2011 0500   PLT 419* 10/15/2011 0500   MCV 81.3 10/15/2011 0500   MCH 26.8 10/15/2011 0500   MCHC 33.0 10/15/2011 0500   RDW 16.6* 10/15/2011 0500   LYMPHSABS 0.2* 10/12/2011 0100   MONOABS 0.1 10/12/2011 0100   EOSABS 0.0 10/12/2011 0100   BASOSABS 0.0 10/12/2011 0100     Assessment:  1. 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 pulm edema 3. COPD 4. E COLI UTI 5. Anemia sec CKD and iron def 6.Sec HPTH   Plan: 1. Her UO has increased significantly but Scr continues to rise...not sure she is going to avoid HD.  She is willing to do it if needed.  Since UO is increasing and rate of rise of Scr slowing we could wait another day before consider HD 2. E coli UTI on imipenem 3. Discussed with Dr Molli Knock placing 3 lumen HD in anticipation of HD  [name] [name]

## 2011-10-15 NOTE — Clinical Documentation Improvement (Signed)
Abnormal Labs Clarification  THIS DOCUMENT IS NOT A PERMANENT PART OF THE MEDICAL RECORD  TO RESPOND TO THE THIS QUERY, FOLLOW THE INSTRUCTIONS BELOW:  1. If needed, update documentation for the patient's encounter via the notes activity.  2. Access this query again and click edit on the [employer].  3. After updating, or not, click F2 to complete all highlighted (required) fields concerning your review. Select "additional documentation in the medical record" OR "no additional documentation provided".  4. Click Sign note button.  5. The deficiency will fall out of your InBasket *Please let us know if you are not able to compete this workflow by phone or e-mail (listed below).  Please update your documentation within the medical record to reflect your response to this query.                                                                                   10/15/11  Dear Dr. Molli Knock Marton Redwood  In a better effort to capture your patient's severity of illness, reflect appropriate length of stay and utilization of resources, a review of the medical record has revealed the following indicators.    Based on your clinical judgment, please clarify and document in a progress note and/or discharge summary the clinical condition associated with the following supporting information:  In responding to this query please exercise your independent judgment.  The fact that a query is asked, does not imply that any particular answer is desired or expected.  Abnormal findings (laboratory, x-ray, pathologic, and other diagnostic results) are not coded and reported unless the physician indicates their clinical significance.   The medical record reflects the following clinical findings, please clarify the diagnostic and/or clinical significance:      Possible Clinical Conditions?                                 Supporting Information:  Hypernatremia                                   Lab Test: Na+ 131,  129  Hyponatremia                                               Risk Factors: diuretics                                        Patient Values: 131, 129                         Normal Values :135-145  Other Condition___________________                     Cannot Clinically Determine_________   Reviewed:  no additional documentation provided [facility] 11/07/11   Thank You,  Amada Kingfisher RN,  BSN, CCM Clinical Documentation Specialist: [phone] phone [email or web address]  [employer] [place]

## 2011-10-15 NOTE — Progress Notes (Signed)
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 She was treated with diuretics since admission but despite that developed increased oxygen requirements requiring NIMV and ICU transfer. PCCM was consulted to assist in management.  Antibiotics:   12/08 Ciprofloxacin (UTI)>>>12/11  12/11 Imipenem>>>  Cultures/Sepsis Markers:   12/09 UC>>>E.Coli resistant to Ciprofloxacin (ESBL  Access/Protocols: 12/08 L IJ TLC>>>  12/08 Foley>>>  Best Practice: DVT: SCD's GI: Protonix  Subjective:   Physical Exam: Filed Vitals:   10/15/11 1000  BP: 154/60  Pulse: 93  Temp:   Resp: 16    Intake/Output Summary (Last 24 hours) at 10/15/11 1149 Last data filed at 10/15/11 1000  Gross per 24 hour  Intake  614.2 ml  Output   2225 ml  Net -1610.8 ml   Vent Mode:  [-]  FiO2 (%):  [79 %-100 %] 79 %  Neuro: Alert and oriented, moving all extremities to command. Cardiac: RRR, Nl S1/S2, -M/R/G. Pulmonary: Diffuse crackles. GI: Soft, NT, ND and +BS. Extremities: 2+ edema and -tenderness.  Labs: CBC    Component Value Date/Time   WBC 11.2* 10/15/2011 0500   RBC 3.21* 10/15/2011 0500   HGB 8.6* 10/15/2011 0500   HCT 26.1* 10/15/2011 0500   PLT 419* 10/15/2011 0500   MCV 81.3 10/15/2011 0500   MCH 26.8 10/15/2011 0500   MCHC 33.0 10/15/2011 0500   RDW 16.6* 10/15/2011 0500   LYMPHSABS 0.2* 10/12/2011 0100   MONOABS 0.1 10/12/2011 0100   EOSABS 0.0 10/12/2011 0100   BASOSABS 0.0 10/12/2011 0100   BMET    Component Value Date/Time   NA 133* 10/15/2011 0500   K 3.6 10/15/2011 0500   CL 85* 10/15/2011 0500   CO2 34* 10/15/2011 0500   GLUCOSE 172* 10/15/2011 0500   BUN 100* 10/15/2011 0500   CREATININE 5.83* 10/15/2011 0500   CALCIUM 7.7* 10/15/2011 0500   GFRNONAA 7* 10/15/2011 0500   GFRAA 8* 10/15/2011 0500   ABG    Component Value Date/Time   PHART 7.373 10/14/2011 2146   PCO2ART 58.4*  10/14/2011 2146   PO2ART 52.6* 10/14/2011 2146   HCO3 33.1* 10/14/2011 2146   TCO2 34.9 10/14/2011 2146   ACIDBASEDEF 1.9 10/11/2011 2351   O2SAT 86.1 10/14/2011 2146    Lab 10/12/11 0100  MG 2.4   Lab Results  Component Value Date   CALCIUM 7.7* 10/15/2011   PHOS 8.0* 10/15/2011    Chest Xray:   Assessment & Plan:  Sub-acute hypoxemic respiratory failure secondary to pulmonary edema / fluid overload. Doubt significant chronic component as normal HCO3 on admission. Doubt effusions are significant impairment to her respiratory status. No evidence of bronchospasm. Even if she does have COPD, this is not the picture of COPD exacerbation.  ? If patient has a PE as the level of hypoxemia supercedes the CXR findings.  Lab  10/14/11 2146  10/11/11 2351   PHART  7.373  7.307*   PCO2ART  58.4*  48.5*   PO2ART  52.6*  120.0*   HCO3  33.1*  23.5   TCO2  34.9  25.0   O2SAT  86.1  98.5   - NIMV/BiPAP, goal SpO2>92%  - No role for steroids. - Unable to perform CT due to renal function, unstable for V/Q scan, will scan lower ext and start heparin drip.  D-dimer will surely be positive.  Worsening CKD secondary to atrophic kideney, DM, HTN. Marginally negative  fluid balance in spite of large doses of diuretics. Renal following, considering HD. Resolved hyperkalemia.  Lab  10/14/11 0420  10/13/11 0410  10/12/11 0740  10/12/11 0100   CREATININE  5.54*  5.19*  4.84*  5.16*    Lab  10/14/11 0420  10/13/11 0410  10/12/11 0740  10/12/11 0100   K  4.0  4.2  5.0  5.5*   - Continue Metolazone  - D/C Lasix. - Continue Bumex gtt, goal output 75-100 cc/h. - May need HD. - D/C bicarbonate as trending up   UTI with ESBL E. Coli resistant to Ciprofloxacin  - Continue primaxin.  Diabetes mellitus   Lab  10/14/11 2143  10/14/11 1731  10/14/11 1218  10/14/11 0849  10/13/11 2108   GLUCAP  209*  110*  184*  194*  325*   -->BCBGM+SSI  -->d/c Lantus as NPO   Hypertension / CAD. No active ischemia /  arrhythmia.  -->continue ASA, Plavix, Amlodipine, Clonidine, Hydralazine, Imdur, Metoprolol   Anemia, multifactorial.   Lab  10/14/11 0420  10/13/11 0410  10/12/11 0740  10/12/11 0100   HCT  25.2*  23.6*  24.8*  27.2*   - Trend Hct   Hypothyroidism  -->Levothyroxin   Best practices / Disposition  -->ICU status under PCCM  -->full code status, spoke with daughter Lawson Fiscal, son on way in for discussion of code status. -->Heparin for DVT Px  -->Protonix for GI Px  -->NPO for now  -->family updated over the phone   The patient is critically ill with multiple organ systems failure and requires high complexity decision making for assessment and support, frequent evaluation and titration of therapies, application of advanced monitoring technologies and extensive interpretation of multiple databases. Critical Care Time devoted to patient care services described in this note is 45 minutes.  Koren Bound, MD

## 2011-10-15 NOTE — Progress Notes (Signed)
ANTICOAGULATION CONSULT NOTE - Initial Consult  Pharmacy Consult for Heparin Indication: r/o PE  Allergies  Allergen Reactions  . Novocain Other (See Comments)    Unknown    Patient Measurements: Height: 5\' 5"  (165.1 cm) Weight: 165 lb 2 oz (74.9 kg) IBW/kg (Calculated) : 57    Vital Signs: Temp: 98.9 F (37.2 C) (12/12 1208) Temp src: Oral (12/12 1208) BP: 170/61 mmHg (12/12 1159) Pulse Rate: 97  (12/12 1159)  Labs:  Basename 10/15/11 0500 10/14/11 0420 10/13/11 0410 10/12/11 1509  HGB 8.6* 7.9* -- --  HCT 26.1* 25.2* 23.6* --  PLT 419* 384 313 --  APTT -- -- -- --  LABPROT -- -- -- --  INR -- -- -- --  HEPARINUNFRC -- -- -- --  CREATININE 5.83* 5.54* 5.19* --  CKTOTAL -- -- -- 45  CKMB -- -- -- 4.6*  TROPONINI -- -- -- 4.03*   Estimated Creatinine Clearance: 9.4 ml/min (by C-G formula based on Cr of 5.83).  Medical History: Past Medical History  Diagnosis Date  . Diabetes mellitus   . Hypercholesterolemia   . Hypothyroidism   . Colitis   . Chronic kidney disease   . Coronary artery disease   . Hypertension   . GERD (gastroesophageal reflux disease)   . Myocardial infarction   . Peripheral vascular disease   . COPD (chronic obstructive pulmonary disease)   . Pneumonia     Medications:  Prescriptions prior to admission  Medication Sig Dispense Refill  . amLODipine (NORVASC) 10 MG tablet Take 10 mg by mouth daily.        . cloNIDine (CATAPRES) 0.2 MG tablet Take 0.2 mg by mouth 3 (three) times daily.        . furosemide (LASIX) 40 MG tablet Take 40 mg by mouth 2 (two) times daily.        . hydrALAZINE (APRESOLINE) 50 MG tablet Take 50 mg by mouth 2 (two) times daily.        . insulin glargine (LANTUS) 100 UNIT/ML injection Inject 1-100 Units into the skin at bedtime. Use as directed from pharmacy list.       . insulin regular (HUMULIN R,NOVOLIN R) 100 units/mL injection Inject 1-100 Units into the skin 3 (three) times daily before meals. Use as  directed per pharmacy list.       . isosorbide mononitrate (IMDUR) 120 MG 24 hr tablet Take 120 mg by mouth daily.        Marland Kitchen levothyroxine (SYNTHROID, LEVOTHROID) 25 MCG tablet Take 25 mcg by mouth daily.        . metoprolol (LOPRESSOR) 50 MG tablet Take 50 mg by mouth 2 (two) times daily.        . nitroGLYCERIN (NITROSTAT) 0.4 MG SL tablet Place 0.4 mg under the tongue every 5 (five) minutes as needed. For chest pain.       . pantoprazole (PROTONIX) 40 MG tablet Take 40 mg by mouth daily.        . potassium chloride SA (K-DUR,KLOR-CON) 20 MEQ tablet Take 20 mEq by mouth 2 (two) times daily.        . ranitidine (ZANTAC) 150 MG tablet Take 150 mg by mouth 2 (two) times daily.        Marland Kitchen DISCONTD: AMLODIPINE BESYLATE PO Take by mouth daily.       Marland Kitchen DISCONTD: Atorvastatin Calcium (LIPITOR PO) Take by mouth daily.        Marland Kitchen DISCONTD: CLONIDINE HCL PO Take by  mouth 3 (three) times daily.        Marland Kitchen DISCONTD: Clopidogrel Bisulfate (PLAVIX PO) Take by mouth daily.        Marland Kitchen DISCONTD: HYDRALAZINE HCL PO Take by mouth 2 (two) times daily.        Marland Kitchen DISCONTD: Insulin Isophane Human (HUMULIN [place]) Inject into the skin 2 (two) times daily.        Marland Kitchen DISCONTD: ISOSORBIDE MONONITRATE PO Take by mouth daily.        Marland Kitchen DISCONTD: METOPROLOL TARTRATE PO Take by mouth 2 (two) times daily.        Marland Kitchen DISCONTD: PREDNISONE PO Take by mouth daily.        Marland Kitchen DISCONTD: Ranolazine (RANEXA PO) Take by mouth 2 (two) times daily.        Marland Kitchen DISCONTD: SODIUM BICARBONATE PO Take by mouth 2 (two) times daily.          Assessment: [name] to begin heparin for suspected PE. Unable to do VQ scan due to pt too unstable. Pt was on heparin SQ q8h for VTE prophylaxis. Noted plan for dopplers of lower extremity.  Goal of Therapy:  Heparin level 0.3-0.7 units/ml   Plan:  1. Heparin bolus 3500 units IV 2. Heparin gtt at 1100 units/hr 3. F/u 8 hr level 4. Daily Heparin level and CBC  [name] [name], [name] [name] 10/15/2011,1:14 PM

## 2011-10-15 NOTE — Clinical Documentation Improvement (Signed)
DIABETIC  DOCUMENTATION CLARIFICATION QUERY  THIS DOCUMENT IS NOT A PERMANENT PART OF THE MEDICAL RECORD  TO RESPOND TO THE THIS QUERY, FOLLOW THE INSTRUCTIONS BELOW:  1. If needed, update documentation for the patient's encounter via the notes activity.  2. Access this query again and click edit on the [employer].  3. After updating, or not, click F2 to complete all highlighted (required) fields concerning your review. Select "additional documentation in the medical record" OR "no additional documentation provided".  4. Click Sign note button.  5. The deficiency will fall out of your InBasket *Please let us know if you are not able to compete this workflow by phone or e-mail (listed below).  Please update your documentation within the medical record to reflect your response to this query.                                                                                        10/15/11   Dear Dr.[email or web address]:/Associates,  In a better effort to capture your patient's severity of illness, reflect appropriate length of stay and utilization of resources, a review of the patient medical record has revealed the following indicators.    Based on your clinical judgment, please clarify and document in a progress note and/or discharge summary the clinical condition associated with the following supporting information:  In responding to this query please exercise your independent judgment.  The fact that a query is asked, does not imply that any particular answer is desired or expected.  Please clarify and specify Diabetes type, control, manifestations, and associated conditions.  Possible Clinical Conditions?   _______Diabetes Type 2 _______Controlled or uncontrolled  Manifestations:  _______DM PVD  _______DM nephropathy  Associated conditions: _______DM gastroparesis  _______Other Condition _______Cannot Clinically determine     Supporting Information:.   . Risk factors: atrophic  kidney, pmh: diabetes, gerd, atrophic kidney   . Diagnostics:     Lab: Hgb A1C: 10.3 on 12/11 documentation    Blood glucose range: 209, 110, 184, 194, 325 12/11- 12/10    Electrolytes: 131, 129  Treatment: monitor  You may use possible, probable, or suspect with inpatient documentation. possible, probable, suspected diagnoses MUST be documented at the time of discharge  Reviewed:  no additional documentation provided djh 11/07/11  Thank You,  Amada Kingfisher  Clinical Documentation Specialist: [phone] Pager [email or web address] Health Information Management 

## 2011-10-15 NOTE — Progress Notes (Signed)
Bilateral lower extremity venous duplex completed at 14:10.  Preliminary report is positive for DVT in the right peroneal vein. Smiley Houseman 10/15/2011, 3:11 PM

## 2011-10-15 NOTE — Progress Notes (Signed)
Nursing. 10/15/11. 5409. Ok to hold morning PO meds due to the patient's condition until further notice by Dr. Darrick Penna.

## 2011-10-15 NOTE — Progress Notes (Signed)
70yo female with worsening CKD due to atrophic kidney to begin Primaxin for UTI with ESBL E.coli resistant to Cipro.  Current CrCl 56ml/min, beginning Bumex drip to improve UOP.  Will begin Primaxin 250mg  IV Q12H and monitor SCr, Cx, CBC.  Vernard Gambles, PharmD, BCPS 10/15/2011 12:19 AM

## 2011-10-16 ENCOUNTER — Inpatient Hospital Stay (HOSPITAL_COMMUNITY): Payer: Medicare Other

## 2011-10-16 DIAGNOSIS — J9600 Acute respiratory failure, unspecified whether with hypoxia or hypercapnia: Secondary | ICD-10-CM

## 2011-10-16 DIAGNOSIS — N179 Acute kidney failure, unspecified: Secondary | ICD-10-CM

## 2011-10-16 DIAGNOSIS — E119 Type 2 diabetes mellitus without complications: Secondary | ICD-10-CM

## 2011-10-16 DIAGNOSIS — J810 Acute pulmonary edema: Secondary | ICD-10-CM

## 2011-10-16 LAB — HEPATIC FUNCTION PANEL
ALT: 13 U/L (ref 0–35)
Alkaline Phosphatase: 142 U/L — ABNORMAL HIGH (ref 39–117)
Bilirubin, Direct: 0.1 mg/dL (ref 0.0–0.3)
Indirect Bilirubin: 0.1 mg/dL — ABNORMAL LOW (ref 0.3–0.9)

## 2011-10-16 LAB — HEPARIN LEVEL (UNFRACTIONATED)
Heparin Unfractionated: 0.34 IU/mL (ref 0.30–0.70)
Heparin Unfractionated: 0.36 IU/mL (ref 0.30–0.70)
Heparin Unfractionated: 0.62 IU/mL (ref 0.30–0.70)

## 2011-10-16 LAB — RENAL FUNCTION PANEL
Calcium: 8 mg/dL — ABNORMAL LOW (ref 8.4–10.5)
GFR calc Af Amer: 7 mL/min — ABNORMAL LOW (ref >90)
GFR calc non Af Amer: 6 mL/min — ABNORMAL LOW (ref >90)
Phosphorus: 8.1 mg/dL — ABNORMAL HIGH (ref 2.3–4.6)
Sodium: 136 mEq/L (ref 135–145)

## 2011-10-16 LAB — GLUCOSE, CAPILLARY
Glucose-Capillary: 142 mg/dL — ABNORMAL HIGH (ref 70–99)
Glucose-Capillary: 80 mg/dL (ref 70–99)
Glucose-Capillary: 123 mg/dL — ABNORMAL HIGH (ref 70–99)

## 2011-10-16 LAB — CBC
Hemoglobin: 9.6 g/dL — ABNORMAL LOW (ref 12.0–15.0)
MCH: 26.1 pg (ref 26.0–34.0)
MCHC: 32.8 g/dL (ref 30.0–36.0)
RDW: 16.9 % — ABNORMAL HIGH (ref 11.5–15.5)

## 2011-10-16 LAB — LIPASE, BLOOD: Lipase: 13 U/L (ref 11–59)

## 2011-10-16 LAB — MAGNESIUM: Magnesium: 2.2 mg/dL (ref 1.5–2.5)

## 2011-10-16 MED ORDER — DEXTROSE 50 % IV SOLN: 25.0000 mL | Freq: Once | INTRAVENOUS | Status: AC | PRN

## 2011-10-16 MED ORDER — ACETAZOLAMIDE SODIUM 500 MG IJ SOLR
250.0000 mg | Freq: Three times a day (TID) | INTRAMUSCULAR | Status: AC
  Administered 2011-10-16 (×2): 250 mg via INTRAVENOUS
  Filled 2011-10-16 (×2): qty 500

## 2011-10-16 MED ORDER — SODIUM CHLORIDE 0.9 % IJ SOLN
INTRAMUSCULAR | Status: AC
  Filled 2011-10-16: qty 20

## 2011-10-16 MED ORDER — POTASSIUM CHLORIDE 10 MEQ/50ML IV SOLN
INTRAVENOUS | Status: AC
  Administered 2011-10-16: 10 meq
  Filled 2011-10-16: qty 50

## 2011-10-16 MED ORDER — POTASSIUM CHLORIDE 10 MEQ/50ML IV SOLN: 10.0000 meq | INTRAVENOUS | Status: DC

## 2011-10-16 MED ORDER — DEXTROSE 50 % IV SOLN
INTRAVENOUS | Status: AC
  Administered 2011-10-16: 25 mL via INTRAVENOUS
  Filled 2011-10-16: qty 50

## 2011-10-16 MED ORDER — METOPROLOL TARTRATE 1 MG/ML IV SOLN
2.5000 mg | Freq: Once | INTRAVENOUS | Status: AC
  Administered 2011-10-16: 2.5 mg via INTRAVENOUS

## 2011-10-16 MED ORDER — POTASSIUM CHLORIDE 10 MEQ/50ML IV SOLN
10.0000 meq | INTRAVENOUS | Status: AC
  Administered 2011-10-16 (×3): 10 meq via INTRAVENOUS
  Filled 2011-10-16: qty 100

## 2011-10-16 MED ORDER — SODIUM CHLORIDE 0.9 % IJ SOLN
INTRAMUSCULAR | Status: AC
  Administered 2011-10-16: 10:00:00
  Filled 2011-10-16: qty 30

## 2011-10-16 NOTE — Progress Notes (Signed)
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 She was treated with diuretics since admission but despite that developed increased oxygen requirements requiring NIMV and ICU transfer. PCCM was consulted to assist in management.  Antibiotics:   12/08 Ciprofloxacin (UTI)>>>12/11  12/11 Imipenem>>>  Cultures/Sepsis Markers:   12/09 UC>>>E.Coli resistant to Ciprofloxacin (ESBL  Access/Protocols: 12/08 L IJ TLC>>>  12/08 Foley>>>  Best Practice: DVT: SCD's GI: Protonix  Subjective: Complains of abdominal pain.  Physical Exam: Filed Vitals:   10/16/11 1400  BP: 176/62  Pulse: 106  Temp:   Resp: 19    Intake/Output Summary (Last 24 hours) at 10/16/11 1410 Last data filed at 10/16/11 1400  Gross per 24 hour  Intake   1065 ml  Output   3015 ml  Net  -1950 ml   Vent Mode:  [-]  FiO2 (%):  [59.4 %-70.5 %] 59.6 %  Neuro: Alert and oriented, moving all extremities to command. Cardiac: RRR, Nl S1/S2, -M/R/G. Pulmonary: Diffuse crackles. GI: Soft, NT, ND and +BS. Extremities: 2+ edema and -tenderness.  Labs: CBC    Component Value Date/Time   WBC 18.0* 10/16/2011 0430   RBC 3.68* 10/16/2011 0430   HGB 9.6* 10/16/2011 0430   HCT 29.3* 10/16/2011 0430   PLT 555* 10/16/2011 0430   MCV 79.6 10/16/2011 0430   MCH 26.1 10/16/2011 0430   MCHC 32.8 10/16/2011 0430   RDW 16.9* 10/16/2011 0430   LYMPHSABS 0.2* 10/12/2011 0100   MONOABS 0.1 10/12/2011 0100   EOSABS 0.0 10/12/2011 0100   BASOSABS 0.0 10/12/2011 0100   BMET    Component Value Date/Time   NA 136 10/16/2011 0430   K 3.0* 10/16/2011 0430   CL 85* 10/16/2011 0430   CO2 34* 10/16/2011 0430   GLUCOSE 59* 10/16/2011 0430   BUN 103* 10/16/2011 0430   CREATININE 6.08* 10/16/2011 0430   CALCIUM 8.0* 10/16/2011 0430   GFRNONAA 6* 10/16/2011 0430   GFRAA 7* 10/16/2011 0430   ABG    Component Value Date/Time   PHART 7.373  10/14/2011 2146   PCO2ART 58.4* 10/14/2011 2146   PO2ART 52.6* 10/14/2011 2146   HCO3 33.1* 10/14/2011 2146   TCO2 34.9 10/14/2011 2146   ACIDBASEDEF 1.9 10/11/2011 2351   O2SAT 86.1 10/14/2011 2146    Lab 10/16/11 0430  MG 2.2   Lab Results  Component Value Date   CALCIUM 8.0* 10/16/2011   PHOS 8.1* 10/16/2011    Chest Xray:   Assessment & Plan:  Sub-acute hypoxemic respiratory failure secondary to pulmonary edema / fluid overload. Doubt significant chronic component as normal HCO3 on admission. Doubt effusions are significant impairment to her respiratory status. No evidence of bronchospasm. Even if she does have COPD, this is not the picture of COPD exacerbation.  ? If patient has a PE as the level of hypoxemia supercedes the CXR findings.  Improving with BiPAP and fluid removal. - NIMV/BiPAP, goal SpO2>92%  - No role for steroids. - Unable to perform CT due to renal function, unstable for V/Q scan, will scan lower ext and start heparin drip.  D-dimer will surely be positive.  Worsening CKD secondary to atrophic kideney, DM, HTN. Marginally negative fluid balance in spite of large doses of diuretics. Renal following, considering HD. Resolved hyperkalemia.  - Continue Metolazone  - Continue Bumex gtt, goal output 75-100 cc/h. - May need HD, will defer to renal. - Two doses of  acetazolamide to address.  UTI with ESBL E. Coli resistant to Ciprofloxacin  - Continue primaxin.  Diabetes mellitus  - BCBGM+SSI  - d/c Lantus as NPO   Hypertension / CAD. No active ischemia / arrhythmia.  - Continue ASA, Plavix, Amlodipine, Clonidine, Hydralazine, Imdur, Metoprolol   Anemia, multifactorial.  - Trend Hct   Hypothyroidism  - Levothyroxin   Best practices / Disposition  - ICU status under PCCM  - full code status, spoke with daughter Lawson Fiscal, son on way in for discussion of code status. - Heparin for DVT Px  - Protonix for GI Px  - NPO for now   The patient is critically ill  with multiple organ systems failure and requires high complexity decision making for assessment and support, frequent evaluation and titration of therapies, application of advanced monitoring technologies and extensive interpretation of multiple databases. Critical Care Time devoted to patient care services described in this note is 45 minutes.  Koren Bound, MD

## 2011-10-16 NOTE — Progress Notes (Signed)
ANTICOAGULATION CONSULT NOTE - Follow Up Consult  Pharmacy Consult for heparin Indication: r/o PE  Allergies  Allergen Reactions  . Novocain Other (See Comments)    Unknown    Patient Measurements: Height: 5\' 5"  (165.1 cm) Weight: 164 lb 0.4 oz (74.4 kg) IBW/kg (Calculated) : 57   Vital Signs: Temp: 99.5 F (37.5 C) (12/13 0000) Temp src: Oral (12/13 0000) BP: 161/77 mmHg (12/13 0200) Pulse Rate: 111  (12/13 0200)  Labs:  Basename 10/15/11 0500 10/15/11 10/14/11 0420 10/13/11 0410  HGB 8.6* -- 7.9* --  HCT 26.1* -- 25.2* 23.6*  PLT 419* -- 384 313  APTT -- -- -- --  LABPROT -- -- -- --  INR -- -- -- --  HEPARINUNFRC -- 0.62 -- --  CREATININE 5.83* -- 5.54* 5.19*  CKTOTAL -- -- -- --  CKMB -- -- -- --  TROPONINI -- -- -- --   Estimated Creatinine Clearance: 9.3 ml/min (by C-G formula based on Cr of 5.83).   Medications:  Scheduled:    . ipratropium  0.5 mg Nebulization TID   And  . albuterol  2.5 mg Nebulization TID  . amLODipine  10 mg Oral QHS  . aspirin  81 mg Oral Daily  . calcium acetate  667 mg Oral TID WC  . cloNIDine  0.1 mg Oral TID  . clopidogrel  75 mg Oral Q breakfast  . docusate sodium  100 mg Oral BID  . guaiFENesin  600 mg Oral BID  . heparin  3,500 Units Intravenous Once  . hydrALAZINE  50 mg Oral Q8H  . imipenem-cilastatin  250 mg Intravenous Q12H  . insulin aspart  0-20 Units Subcutaneous TID WC  . insulin aspart  0-5 Units Subcutaneous QHS  . insulin glargine  14 Units Subcutaneous QHS  . isosorbide mononitrate  120 mg Oral Daily  . levothyroxine  25 mcg Oral Q0600  . metolazone  10 mg Oral Daily  . metoprolol  50 mg Oral BID  . multivitamin  1 tablet Oral QHS  . pantoprazole (PROTONIX) IV  40 mg Intravenous Q24H  . sodium chloride      . DISCONTD: heparin subcutaneous  5,000 Units Subcutaneous Q8H   Infusions:    . heparin 1,100 Units/hr (10/15/11 1500)  . sodium chloride 0.9 % 100 mL with bumetanide (BUMEX) 25 mg infusion 4  mL/hr at 10/15/11 1000  . DISCONTD: midazolam (VERSED) infusion      Assessment: 70yo female therapeutic on heparin (level 0.62) with initial dosing for r/o PE.  Goal of Therapy:  Heparin level 0.3-0.7 units/ml   Plan:  Will continue heparin gtt at current rate and confirm stable with additional level.  Colleen Can PharmD BCPS 10/16/2011,3:36 AM

## 2011-10-16 NOTE — Progress Notes (Signed)
ANTICOAGULATION CONSULT NOTE - Follow Up Consult  Pharmacy Consult for: Heparin Indication: r/o PE  Allergies  Allergen Reactions  . Novocain Other (See Comments)    Unknown    Patient Measurements: Height: 5\' 5"  (165.1 cm) Weight: 164 lb 0.4 oz (74.4 kg) IBW/kg (Calculated) : 57   Vital Signs: Temp: 98 F (36.7 C) (12/13 0800) Temp src: Axillary (12/13 0800) BP: 187/65 mmHg (12/13 0926) Pulse Rate: 103  (12/13 0926)  Labs:  Basename 10/16/11 0946 10/16/11 0430 10/15/11 0500 10/15/11 10/14/11 0420  HGB -- 9.6* 8.6* -- --  HCT -- 29.3* 26.1* -- 25.2*  PLT -- 555* 419* -- 384  APTT -- -- -- -- --  LABPROT -- -- -- -- --  INR -- -- -- -- --  HEPARINUNFRC 0.36 -- -- 0.62 --  CREATININE -- 6.08* 5.83* -- 5.54*  CKTOTAL -- -- -- -- --  CKMB -- -- -- -- --  TROPONINI -- -- -- -- --   Estimated Creatinine Clearance: 8.9 ml/min (by C-G formula based on Cr of 6.08).   Medications:  Heparin @ 1100 units/hr  Assessment: 70yof continues on heparin for r/o PE. Confirmatory heparin level of 0.36 is therapeutic. Will continue at current rate.  Goal of Therapy:  Heparin level 0.3-0.7 units/ml   Plan:  1) Continue heparin at 1100 units/hr 2) Follow up AM heparin level  Fredrik Rigger 10/16/2011,12:08 PM

## 2011-10-16 NOTE — Progress Notes (Addendum)
S:on BiPAP.  CO nausea and abd pain O:BP 185/66  Pulse 106  Temp(Src) 98.4 F (36.9 C) (Oral)  Resp 20  Ht 5\' 5"  (1.651 m)  Wt 74.4 kg (164 lb 0.4 oz)  BMI 27.29 kg/m2  SpO2 93%  Intake/Output Summary (Last 24 hours) at 10/16/11 0750 Last data filed at 10/16/11 0700  Gross per 24 hour  Intake  830.2 ml  Output   2450 ml  Net -1619.8 ml   Weight change: -0.5 kg (-1 lb 1.6 oz) ZOX:WRUEA and alert CVS:RRR Resp:decreased BS bases,bil crackles Abd:+BS  Diffuse abd pain, no rebound Ext:no edema NEURO:Ox3 follows commands, no asterixis      . ipratropium  0.5 mg Nebulization TID   And  . albuterol  2.5 mg Nebulization TID  . amLODipine  10 mg Oral QHS  . aspirin  81 mg Oral Daily  . calcium acetate  667 mg Oral TID WC  . cloNIDine  0.1 mg Oral TID  . clopidogrel  75 mg Oral Q breakfast  . docusate sodium  100 mg Oral BID  . guaiFENesin  600 mg Oral BID  . heparin  3,500 Units Intravenous Once  . hydrALAZINE  50 mg Oral Q8H  . imipenem-cilastatin  250 mg Intravenous Q12H  . insulin aspart  0-20 Units Subcutaneous TID WC  . insulin aspart  0-5 Units Subcutaneous QHS  . insulin glargine  14 Units Subcutaneous QHS  . isosorbide mononitrate  120 mg Oral Daily  . levothyroxine  25 mcg Oral Q0600  . metolazone  10 mg Oral Daily  . metoprolol  50 mg Oral BID  . multivitamin  1 tablet Oral QHS  . pantoprazole (PROTONIX) IV  40 mg Intravenous Q24H  . DISCONTD: heparin subcutaneous  5,000 Units Subcutaneous Q8H   Dg Chest Port 1 View  10/14/2011  *RADIOLOGY REPORT*  Clinical Data: Respiratory distress  PORTABLE CHEST - 1 VIEW  Comparison: Multiple recent previous exams.  Findings: 2206 hours. The cardiopericardial silhouette is enlarged. Left greater than right interstitial and basilar airspace disease persist.  Probable small bilateral pleural effusions.  Right IJ central line tip is stable, positioned the proximal SVC level. Telemetry leads overlie the chest.  IMPRESSION:  Stable exam.  Cardiomegaly with asymmetric left greater than right interstitial and basilar airspace opacities.  Original Report Authenticated By: ERIC A. MANSELL, M.D.   BMET    Component Value Date/Time   NA 136 10/16/2011 0430   K 3.0* 10/16/2011 0430   CL 85* 10/16/2011 0430   CO2 34* 10/16/2011 0430   GLUCOSE 59* 10/16/2011 0430   BUN 103* 10/16/2011 0430   CREATININE 6.08* 10/16/2011 0430   CALCIUM 8.0* 10/16/2011 0430   GFRNONAA 6* 10/16/2011 0430   GFRAA 7* 10/16/2011 0430   CBC    Component Value Date/Time   WBC 18.0* 10/16/2011 0430   RBC 3.68* 10/16/2011 0430   HGB 9.6* 10/16/2011 0430   HCT 29.3* 10/16/2011 0430   PLT 555* 10/16/2011 0430   MCV 79.6 10/16/2011 0430   MCH 26.1 10/16/2011 0430   MCHC 32.8 10/16/2011 0430   RDW 16.9* 10/16/2011 0430   LYMPHSABS 0.2* 10/12/2011 0100   MONOABS 0.1 10/12/2011 0100   EOSABS 0.0 10/12/2011 0100   BASOSABS 0.0 10/12/2011 0100     Assessment:  1. 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 pulm edema 3. COPD 4. E COLI UTI on imipenem 5. Anemia sec CKD and iron def 6.Sec HPTh 7. Poss DVT  on rt on heparin   Plan: 1. I'm concerned about her abd pain.  Will check hepatic fx and lipase 2.  UO remains good but Scr still up though the rate of rise is less.  Not sure this is going to recover before the need for HD 3. Consider CT abd without contrast 4. Cont with bumex drip Haydan Mansouri T

## 2011-10-16 NOTE — Progress Notes (Signed)
Inpatient Diabetes Program Recommendations  AACE/ADA: New Consensus Statement on Inpatient Glycemic Control (2009)  Target Ranges:  Prepandial:   less than 140 mg/dL      Peak postprandial:   less than 180 mg/dL (1-2 hours)      Critically ill patients:  140 - 180 mg/dL   Reason for Visit: CBGs 10/15/11:  [id number]    10/16/11:  [id number]  Inpatient Diabetes Program Recommendations Insulin - Basal: Decrease Lantus to 10 units daily. Insulin - Meal Coverage: X HgbA1C: Will likely need change in home diabetes regimen-A1C=10.3%.  Note:

## 2011-10-17 ENCOUNTER — Inpatient Hospital Stay (HOSPITAL_COMMUNITY): Payer: Medicare Other

## 2011-10-17 LAB — CBC
MCHC: 31 g/dL (ref 30.0–36.0)
Platelets: 512 10*3/uL — ABNORMAL HIGH (ref 150–400)
RDW: 17.1 % — ABNORMAL HIGH (ref 11.5–15.5)
WBC: 16.4 10*3/uL — ABNORMAL HIGH (ref 4.0–10.5)

## 2011-10-17 LAB — COMPREHENSIVE METABOLIC PANEL
Albumin: 2.2 g/dL — ABNORMAL LOW (ref 3.5–5.2)
Alkaline Phosphatase: 138 U/L — ABNORMAL HIGH (ref 39–117)
BUN: 112 mg/dL — ABNORMAL HIGH (ref 6–23)
Calcium: 8.3 mg/dL — ABNORMAL LOW (ref 8.4–10.5)
GFR calc Af Amer: 7 mL/min — ABNORMAL LOW (ref >90)
Potassium: 3.2 mEq/L — ABNORMAL LOW (ref 3.5–5.1)
Sodium: 137 mEq/L (ref 135–145)
Total Protein: 6.2 g/dL (ref 6.0–8.3)

## 2011-10-17 LAB — URINALYSIS, ROUTINE W REFLEX MICROSCOPIC
Bilirubin Urine: NEGATIVE
Glucose, UA: NEGATIVE mg/dL
Specific Gravity, Urine: 1.008 (ref 1.005–1.030)
Urobilinogen, UA: 0.2 mg/dL (ref 0.0–1.0)
pH: 7.5 (ref 5.0–8.0)

## 2011-10-17 LAB — GLUCOSE, CAPILLARY
Glucose-Capillary: 125 mg/dL — ABNORMAL HIGH (ref 70–99)
Glucose-Capillary: 150 mg/dL — ABNORMAL HIGH (ref 70–99)
Glucose-Capillary: 212 mg/dL — ABNORMAL HIGH (ref 70–99)
Glucose-Capillary: 110 mg/dL — ABNORMAL HIGH (ref 70–99)

## 2011-10-17 LAB — PHOSPHORUS: Phosphorus: 10.4 mg/dL — ABNORMAL HIGH (ref 2.3–4.6)

## 2011-10-17 LAB — HEPATITIS B SURFACE ANTIGEN: Hepatitis B Surface Ag: NEGATIVE

## 2011-10-17 LAB — URINE MICROSCOPIC-ADD ON

## 2011-10-17 MED ORDER — POTASSIUM CHLORIDE 10 MEQ/50ML IV SOLN
10.0000 meq | INTRAVENOUS | Status: AC
  Administered 2011-10-17 (×3): 10 meq via INTRAVENOUS

## 2011-10-17 MED ORDER — HEPARIN SODIUM (PORCINE) 1000 UNIT/ML IJ SOLN
1000.0000 [IU] | Freq: Once | INTRAMUSCULAR | Status: AC
  Administered 2011-10-17: 1000 [IU]

## 2011-10-17 MED ORDER — HEPARIN SODIUM (PORCINE) 1000 UNIT/ML IJ SOLN
INTRAMUSCULAR | Status: AC
  Filled 2011-10-17: qty 2

## 2011-10-17 MED ORDER — HEPARIN SODIUM (PORCINE) 1000 UNIT/ML IJ SOLN
INTRAMUSCULAR | Status: AC
  Filled 2011-10-17: qty 1

## 2011-10-17 MED ORDER — POTASSIUM CHLORIDE 10 MEQ/50ML IV SOLN
INTRAVENOUS | Status: AC
  Filled 2011-10-17: qty 150

## 2011-10-17 MED ORDER — SODIUM CHLORIDE 0.9 % IJ SOLN
INTRAMUSCULAR | Status: AC
  Filled 2011-10-17: qty 30

## 2011-10-17 NOTE — Procedures (Signed)
Hemodialysis Catheter Insertion Procedure Note Hilja Kintzel [id number] [birth year]  Procedure: Insertion of Central Venous Catheter Indications: Assessment of intravascular volume  Procedure Details Consent: Risks of procedure as well as the alternatives and risks of each were explained to the (patient/caregiver).  Consent for procedure obtained. Time Out: Verified patient identification, verified procedure, site/side was marked, verified correct patient position, special equipment/implants available, medications/allergies/relevent history reviewed, required imaging and test results available.  Performed  Maximum sterile technique was used including antiseptics. Skin prep: Chlorhexidine; local anesthetic administered Real time [place] used to ID and cannulate the right IJ.  A antimicrobial bonded/coated triple lumen catheter was placed in the right internal jugular vein using the Seldinger technique.  Evaluation Blood flow good Complications: No apparent complications Patient did tolerate procedure well. Chest X-ray ordered to verify placement.  CXR: pending.  Dylin Ihnen,PETE 10/17/2011, 11:14 AM

## 2011-10-17 NOTE — Progress Notes (Signed)
ANTICOAGULATION CONSULT NOTE - Follow Up Consult  Pharmacy Consult for: Heparin Indication: acute DVT/r/o PE  Allergies  Allergen Reactions  . Novocain Other (See Comments)    Unknown    Patient Measurements: Height: 5\' 5"  (165.1 cm) Weight: 153 lb 14.1 oz (69.8 kg) IBW/kg (Calculated) : 57   Vital Signs: Temp: 99 F (37.2 C) (12/14 1350) Temp src: Oral (12/14 1350) BP: 88/46 mmHg (12/14 1400) Pulse Rate: 108  (12/14 1400)  Labs:  Basename 10/17/11 0500 10/16/11 2210 10/16/11 0946 10/16/11 0430 10/15/11 0500  HGB 9.1* -- -- 9.6* --  HCT 29.4* -- -- 29.3* 26.1*  PLT 512* -- -- 555* 419*  APTT -- -- -- -- --  LABPROT -- -- -- -- --  INR -- -- -- -- --  HEPARINUNFRC 0.32 0.34 0.36 -- --  CREATININE 6.73* -- -- 6.08* 5.83*  CKTOTAL -- -- -- -- --  CKMB -- -- -- -- --  TROPONINI -- -- -- -- --   Estimated Creatinine Clearance: 7.8 ml/min (by C-G formula based on Cr of 6.73).   Medications:  Heparin @ 1100 units/hr  Assessment: 70yof continues on heparin for acute DVT/r/o PE. Heparin level of 0.32 is therapeutic. Heparin off from ~1000 to 1400 for HD cath placement.  Goal of Therapy:  Heparin level 0.3-0.7 units/ml   Plan:  1) Continue heparin at 1100 units/hr 2) Follow up AM heparin level  [name] [name], Hilario Quarry 10/17/2011,2:12 PM

## 2011-10-17 NOTE — Progress Notes (Signed)
eLink Physician-Brief Progress Note Patient Name: [name] [name] DOB: 05-Feb-1941 MRN: [id number]  Date of Service  10/17/2011   HPI/Events of Note   Hypokalemia in the setting of renal insufficiency  eICU Interventions  Potassium replaced   Intervention Category Intermediate Interventions: Electrolyte abnormality - evaluation and management  Ivanka Kirshner 10/17/2011, 6:28 AM

## 2011-10-17 NOTE — Progress Notes (Signed)
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 She was treated with diuretics since admission but despite that developed increased oxygen requirements requiring NIMV and ICU transfer. PCCM was consulted to assist in management.  Antibiotics:   12/08 Ciprofloxacin (UTI)>>>12/11  12/11 Imipenem>>>  Cultures/Sepsis Markers:   12/09 UC>>>E.Coli resistant to Ciprofloxacin (ESBL)  Access/Protocols:  12/08 L IJ TLC>>>  12/08 Foley>>> 12/14 R IJ dialysis catheter>>>  Best Practice: DVT: Heparin drip GI: Protonix  Subjective: Near resolution of abdominal pain.  Physical Exam: Filed Vitals:   10/17/11 1200  BP: 145/48  Pulse: 108  Temp:   Resp: 19    Intake/Output Summary (Last 24 hours) at 10/17/11 1359 Last data filed at 10/17/11 1100  Gross per 24 hour  Intake  911.5 ml  Output   3530 ml  Net -2618.5 ml   Vent Mode:  [-]  FiO2 (%):  [49.6 %-60.5 %] 50.2 %  Neuro: Alert and oriented, moving all extremities to command. Cardiac: RRR, Nl S1/S2, -M/R/G. Pulmonary: Diffuse crackles. GI: Soft, NT, ND and +BS. Extremities: 1+ edema and -tenderness.  Labs: CBC    Component Value Date/Time   WBC 16.4* 10/17/2011 0500   RBC 3.58* 10/17/2011 0500   HGB 9.1* 10/17/2011 0500   HCT 29.4* 10/17/2011 0500   PLT 512* 10/17/2011 0500   MCV 82.1 10/17/2011 0500   MCH 25.4* 10/17/2011 0500   MCHC 31.0 10/17/2011 0500   RDW 17.1* 10/17/2011 0500   LYMPHSABS 0.2* 10/12/2011 0100   MONOABS 0.1 10/12/2011 0100   EOSABS 0.0 10/12/2011 0100   BASOSABS 0.0 10/12/2011 0100   BMET    Component Value Date/Time   NA 137 10/17/2011 0500   K 3.2* 10/17/2011 0500   CL 84* 10/17/2011 0500   CO2 31 10/17/2011 0500   GLUCOSE 181* 10/17/2011 0500   BUN 112* 10/17/2011 0500   CREATININE 6.73* 10/17/2011 0500   CALCIUM 8.3* 10/17/2011 0500   GFRNONAA 6* 10/17/2011 0500   GFRAA 7* 10/17/2011 0500    ABG    Component Value Date/Time   PHART 7.373 10/14/2011 2146   PCO2ART 58.4* 10/14/2011 2146   PO2ART 52.6* 10/14/2011 2146   HCO3 33.1* 10/14/2011 2146   TCO2 34.9 10/14/2011 2146   ACIDBASEDEF 1.9 10/11/2011 2351   O2SAT 86.1 10/14/2011 2146    Lab 10/17/11 0500  MG 2.3   Lab Results  Component Value Date   CALCIUM 8.3* 10/17/2011   PHOS 10.4* 10/17/2011    Chest Xray:   Assessment & Plan:  Sub-acute hypoxemic respiratory failure secondary to pulmonary edema/fluid overload. Doubt significant chronic component as normal HCO3 on admission. Doubt effusions are significant impairment to her respiratory status. No evidence of bronchospasm. Even if she does have COPD, this is not the picture of COPD exacerbation.  ? If patient has a PE as the level of hypoxemia supercedes the CXR findings.  Improving with BiPAP and fluid removal.  Unable to get CT due to renal function. - NIMV/BiPAP cycling on and off, goal SpO2>92%  - No role for steroids. - Unable to perform CT due to renal function, unstable for V/Q scan, will scan lower ext and continue heparin drip.  D-dimer will surely be positive. - Heparin drip.  Worsening CKD secondary to atrophic kideney, DM, HTN. Marginally negative fluid balance in spite of large doses of diuretics. Renal following, considering HD. Resolved hyperkalemia.  - HD per renal. -  D/C Bumex GTT.  UTI with ESBL E. Coli resistant to Ciprofloxacin  - Continue primaxin.  Diabetes mellitus  - BCBGM+SSI  - Start diet.   Hypertension / CAD. No active ischemia / arrhythmia.  - Continue ASA, Plavix, Amlodipine, Clonidine, Hydralazine, Imdur, Metoprolol   Anemia, multifactorial.  - Trend Hct   Hypothyroidism  - Levothyroxin   Best practices / Disposition  - ICU status under PCCM  - LCB no intubation, CPR or cardioversion.. - Heparin for DVT Px  - Protonix for GI Px  - Start diet   The patient is critically ill with multiple organ systems failure  and requires high complexity decision making for assessment and support, frequent evaluation and titration of therapies, application of advanced monitoring technologies and extensive interpretation of multiple databases. Critical Care Time devoted to patient care services described in this note is 45 minutes.  Koren Bound, MD

## 2011-10-17 NOTE — Progress Notes (Signed)
S:on BiPAP.  CO nausea and abd pain O:BP 159/57  Pulse 103  Temp(Src) 96.7 F (35.9 C) (Axillary)  Resp 16  Ht 5\' 5"  (1.651 m)  Wt 69.7 kg (153 lb 10.6 oz)  BMI 25.57 kg/m2  SpO2 91%  Intake/Output Summary (Last 24 hours) at 10/17/11 0806 Last data filed at 10/17/11 0700  Gross per 24 hour  Intake    945 ml  Output   3585 ml  Net  -2640 ml   Weight change: -4.7 kg (-10 lb 5.8 oz) WUJ:WJXBJ and alert but confused CVS:RRR Resp:decreased BS bases,bil crackles Abd:+BS  Mostly LLQ tenderness, no rebound Ext:no edema NEURO:Ox1 follows simple commands      . acetaZOLAMIDE  250 mg Intravenous Q8H  . ipratropium  0.5 mg Nebulization TID   And  . albuterol  2.5 mg Nebulization TID  . amLODipine  10 mg Oral QHS  . aspirin  81 mg Oral Daily  . calcium acetate  667 mg Oral TID WC  . cloNIDine  0.1 mg Oral TID  . clopidogrel  75 mg Oral Q breakfast  . docusate sodium  100 mg Oral BID  . guaiFENesin  600 mg Oral BID  . hydrALAZINE  50 mg Oral Q8H  . imipenem-cilastatin  250 mg Intravenous Q12H  . insulin aspart  0-20 Units Subcutaneous TID WC  . insulin aspart  0-5 Units Subcutaneous QHS  . insulin glargine  14 Units Subcutaneous QHS  . isosorbide mononitrate  120 mg Oral Daily  . levothyroxine  25 mcg Oral Q0600  . metolazone  10 mg Oral Daily  . metoprolol  2.5 mg Intravenous Once  . metoprolol  50 mg Oral BID  . multivitamin  1 tablet Oral QHS  . pantoprazole (PROTONIX) IV  40 mg Intravenous Q24H  . potassium chloride  10 mEq Intravenous Q1 Hr x 3  . potassium chloride  10 mEq Intravenous Q1 Hr x 3  . potassium chloride      . potassium chloride      . sodium chloride      . sodium chloride      . DISCONTD: potassium chloride  10 mEq Intravenous Q1 Hr x 4   Dg Chest Port 1 View  10/16/2011  *RADIOLOGY REPORT*  Clinical Data: Respiratory failure.  PORTABLE CHEST - 1 VIEW  Comparison: 10/14/2011.  Findings: Left IJ central line tip projects over the SVC.  Heart size  stable.  Thoracic aorta is calcified.  There is a moderate left pleural effusion.  Mixed interstitial and airspace disease has improved slightly in the interval. Left lower lobe collapse/consolidation.  IMPRESSION: Slight interval improvement in mixed interstitial and airspace disease, which may be due to edema.  Moderate left pleural effusion.  Left lower lobe collapse/consolidation.  Original Report Authenticated By: Reyes Ivan, M.D.   BMET    Component Value Date/Time   NA 137 10/17/2011 0500   K 3.2* 10/17/2011 0500   CL 84* 10/17/2011 0500   CO2 31 10/17/2011 0500   GLUCOSE 181* 10/17/2011 0500   BUN 112* 10/17/2011 0500   CREATININE 6.73* 10/17/2011 0500   CALCIUM 8.3* 10/17/2011 0500   GFRNONAA 6* 10/17/2011 0500   GFRAA 7* 10/17/2011 0500   CBC    Component Value Date/Time   WBC 16.4* 10/17/2011 0500   RBC 3.58* 10/17/2011 0500   HGB 9.1* 10/17/2011 0500   HCT 29.4* 10/17/2011 0500   PLT 512* 10/17/2011 0500   MCV 82.1 10/17/2011 0500  MCH 25.4* 10/17/2011 0500   MCHC 31.0 10/17/2011 0500   RDW 17.1* 10/17/2011 0500   LYMPHSABS 0.2* 10/12/2011 0100   MONOABS 0.1 10/12/2011 0100   EOSABS 0.0 10/12/2011 0100   BASOSABS 0.0 10/12/2011 0100     Assessment:  1. 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 pulm edema 3. COPD 4. E COLI UTI on imipenem 5. Anemia sec CKD and iron def 6.Sec HPTh 7. Poss DVT on rt on heparin 8. Abd pain LFT's and lipase nl   Plan: 1. I think she will need HD.  Though UO good, Scr continues to rise.  Will see if CCM will place HD catheter and then plan HD later today Neena Beecham T

## 2011-10-18 ENCOUNTER — Inpatient Hospital Stay (HOSPITAL_COMMUNITY): Payer: Medicare Other

## 2011-10-18 LAB — CBC
MCH: 25.4 pg — ABNORMAL LOW (ref 26.0–34.0)
MCHC: 30.7 g/dL (ref 30.0–36.0)
MCV: 82.9 fL (ref 78.0–100.0)
Platelets: 404 10*3/uL — ABNORMAL HIGH (ref 150–400)
RBC: 3.34 MIL/uL — ABNORMAL LOW (ref 3.87–5.11)
RDW: 17 % — ABNORMAL HIGH (ref 11.5–15.5)

## 2011-10-18 LAB — RENAL FUNCTION PANEL
Calcium: 9 mg/dL (ref 8.4–10.5)
GFR calc Af Amer: 10 mL/min — ABNORMAL LOW (ref >90)
GFR calc non Af Amer: 9 mL/min — ABNORMAL LOW (ref >90)
Glucose, Bld: 195 mg/dL — ABNORMAL HIGH (ref 70–99)
Phosphorus: 8 mg/dL — ABNORMAL HIGH (ref 2.3–4.6)
Potassium: 3.2 mEq/L — ABNORMAL LOW (ref 3.5–5.1)
Sodium: 143 mEq/L (ref 135–145)

## 2011-10-18 LAB — DIFFERENTIAL
Basophils Absolute: 0 10*3/uL (ref 0.0–0.1)
Eosinophils Absolute: 0.1 10*3/uL (ref 0.0–0.7)
Eosinophils Relative: 1 % (ref 0–5)
Lymphs Abs: 0.9 10*3/uL (ref 0.7–4.0)
Monocytes Absolute: 1 10*3/uL (ref 0.1–1.0)
Neutrophils Relative %: 86 % — ABNORMAL HIGH (ref 43–77)

## 2011-10-18 LAB — URINALYSIS, ROUTINE W REFLEX MICROSCOPIC
Glucose, UA: NEGATIVE mg/dL
Hgb urine dipstick: NEGATIVE
Ketones, ur: NEGATIVE mg/dL
Protein, ur: NEGATIVE mg/dL
Urobilinogen, UA: 0.2 mg/dL (ref 0.0–1.0)

## 2011-10-18 LAB — HEPATITIS B CORE ANTIBODY, TOTAL: Hep B Core Total Ab: NEGATIVE

## 2011-10-18 LAB — GLUCOSE, CAPILLARY
Glucose-Capillary: 243 mg/dL — ABNORMAL HIGH (ref 70–99)
Glucose-Capillary: 178 mg/dL — ABNORMAL HIGH (ref 70–99)
Glucose-Capillary: 183 mg/dL — ABNORMAL HIGH (ref 70–99)

## 2011-10-18 MED ORDER — PRISMASOL BGK 4/2.5 32-4-2.5 MEQ/L IV SOLN: INTRAVENOUS | Status: DC

## 2011-10-18 MED ORDER — POLYETHYLENE GLYCOL 3350 17 G PO PACK
17.0000 g | PACK | Freq: Every day | ORAL | Status: DC
  Administered 2011-10-18 – 2011-10-24 (×4): 17 g via ORAL
  Filled 2011-10-18 (×9): qty 1

## 2011-10-18 MED ORDER — BUMETANIDE 0.25 MG/ML IJ SOLN
4.0000 mg | Freq: Two times a day (BID) | INTRAMUSCULAR | Status: DC
  Administered 2011-10-19: 4 mg via INTRAVENOUS
  Filled 2011-10-18 (×3): qty 16

## 2011-10-18 NOTE — Progress Notes (Signed)
S:Feels better and looks better.  Denies Abd pain O:BP 133/52  Pulse 87  Temp(Src) 97.9 F (36.6 C) (Oral)  Resp 15  Ht 5\' 5"  (1.651 m)  Wt 69.3 kg (152 lb 12.5 oz)  BMI 25.42 kg/m2  SpO2 98%  Intake/Output Summary (Last 24 hours) at 10/18/11 0843 Last data filed at 10/18/11 0600  Gross per 24 hour  Intake  772.5 ml  Output   2470 ml  Net -1697.5 ml   Weight change: 0.1 kg (3.5 oz) ZOX:WRUEA and alert  CVS:RRR Resp:decreased BS bases,bil crackles Abd:+BS  Mostly LLQ tenderness, no rebound Ext:no edema NEURO:Ox1 follows simple commands      . ipratropium  0.5 mg Nebulization TID   And  . albuterol  2.5 mg Nebulization TID  . amLODipine  10 mg Oral QHS  . aspirin  81 mg Oral Daily  . calcium acetate  667 mg Oral TID WC  . cloNIDine  0.1 mg Oral TID  . clopidogrel  75 mg Oral Q breakfast  . docusate sodium  100 mg Oral BID  . guaiFENesin  600 mg Oral BID  . heparin      . heparin      . heparin  1,000 Units Intracatheter Once  . hydrALAZINE  50 mg Oral Q8H  . imipenem-cilastatin  250 mg Intravenous Q12H  . insulin aspart  0-20 Units Subcutaneous TID WC  . insulin aspart  0-5 Units Subcutaneous QHS  . insulin glargine  14 Units Subcutaneous QHS  . isosorbide mononitrate  120 mg Oral Daily  . levothyroxine  25 mcg Oral Q0600  . metolazone  10 mg Oral Daily  . metoprolol  50 mg Oral BID  . multivitamin  1 tablet Oral QHS  . pantoprazole (PROTONIX) IV  40 mg Intravenous Q24H  . potassium chloride  10 mEq Intravenous Q1 Hr x 3  . potassium chloride      . sodium chloride       Dg Chest Port 1 View  10/17/2011  *RADIOLOGY REPORT*  Clinical Data: Central venous line placement  PORTABLE CHEST - 1 VIEW  Comparison: Portable chest x-ray of 10/17/2011  Findings: There is haziness throughout the left hemithorax most consistent with left effusion with basilar atelectasis present bilaterally.  Slightly more opacification remains at the left lung base.  A left IJ central  venous line remains with the tip in the mid upper SVC.  A new right IJ central venous line is present with the tip at the same location within the mid upper SVC.  No pneumothorax is seen.  Heart size is stable.  IMPRESSION:  1.  New right IJ central venous line tip in mid upper SVC.  No pneumothorax. 2.  Little change in left effusion and persistent opacity at the left lung base.  Original Report Authenticated By: Juline Patch, M.D.   Dg Chest Port 1 View  10/17/2011  *RADIOLOGY REPORT*  Clinical Data: Respiratory failure.  PORTABLE CHEST - 1 VIEW  Comparison: 10/16/2011.  Findings: Left IJ central line tip projects over the SVC.  Heart size normal.  Layering left pleural effusion and bibasilar air space disease persist, left greater than right.  IMPRESSION: Layering left pleural effusion and bibasilar air space disease, left greater than right, persist.  Original Report Authenticated By: Reyes Ivan, M.D.   BMET    Component Value Date/Time   NA 143 10/18/2011 0550   K 3.2* 10/18/2011 0550   CL 97 10/18/2011 0550  CO2 31 10/18/2011 0550   GLUCOSE 195* 10/18/2011 0550   BUN 74* 10/18/2011 0550   CREATININE 4.63* 10/18/2011 0550   CALCIUM 9.0 10/18/2011 0550   GFRNONAA 9* 10/18/2011 0550   GFRAA 10* 10/18/2011 0550   CBC    Component Value Date/Time   WBC 14.6* 10/18/2011 0550   RBC 3.34* 10/18/2011 0550   HGB 8.5* 10/18/2011 0550   HCT 27.7* 10/18/2011 0550   PLT 404* 10/18/2011 0550   MCV 82.9 10/18/2011 0550   MCH 25.4* 10/18/2011 0550   MCHC 30.7 10/18/2011 0550   RDW 17.0* 10/18/2011 0550   LYMPHSABS 0.9 10/18/2011 0550   MONOABS 1.0 10/18/2011 0550   EOSABS 0.1 10/18/2011 0550   BASOSABS 0.0 10/18/2011 0550     Assessment:  1. 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 pulm edema 3. COPD 4. E COLI UTI on imipenem 5. Anemia sec CKD and iron def 6.Sec HPTh 7. Poss DVT on rt on heparin 8. Abd pain LFT's and lipase nl   Plan: 1. Will Plan HD again  today 2. Recheck CXR in am 3. Recheck labs in AM 4 recheck UA   Eliz Nigg T

## 2011-10-18 NOTE — Progress Notes (Signed)
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 She was treated with diuretics since admission but despite that developed increased oxygen requirements requiring NIMV and ICU transfer. PCCM was consulted to assist in management.  Antibiotics:   12/08 Ciprofloxacin (UTI)>>>12/11  12/11 Imipenem>>>  Cultures/Sepsis Markers:   12/09 UC>>>E.Coli resistant to Ciprofloxacin (ESBL)  Access/Protocols:  12/08 L IJ TLC>>>  12/08 Foley>>> 12/14 R IJ dialysis catheter>>>  Best Practice: DVT: Heparin drip GI: Protonix  Subjective: Near resolution of abdominal pain.  Physical Exam: Filed Vitals:   10/18/11 1359  BP: 157/69  Pulse: 114  Temp:   Resp: 19    Intake/Output Summary (Last 24 hours) at 10/18/11 1432 Last data filed at 10/18/11 1313  Gross per 24 hour  Intake 1280.5 ml  Output   3510 ml  Net -2229.5 ml   Vent Mode:  [-]  FiO2 (%):  [60 %-100 %] 60 %  Neuro: Alert and oriented, moving all extremities to command. Cardiac: RRR, Nl S1/S2, -M/R/G. Pulmonary: Diffuse crackles. GI: Soft, NT, ND and +BS. Extremities: 1+ edema and -tenderness.  Labs: CBC    Component Value Date/Time   WBC 14.6* 10/18/2011 0550   RBC 3.34* 10/18/2011 0550   HGB 8.5* 10/18/2011 0550   HCT 27.7* 10/18/2011 0550   PLT 404* 10/18/2011 0550   MCV 82.9 10/18/2011 0550   MCH 25.4* 10/18/2011 0550   MCHC 30.7 10/18/2011 0550   RDW 17.0* 10/18/2011 0550   LYMPHSABS 0.9 10/18/2011 0550   MONOABS 1.0 10/18/2011 0550   EOSABS 0.1 10/18/2011 0550   BASOSABS 0.0 10/18/2011 0550   BMET    Component Value Date/Time   NA 143 10/18/2011 0550   K 3.2* 10/18/2011 0550   CL 97 10/18/2011 0550   CO2 31 10/18/2011 0550   GLUCOSE 195* 10/18/2011 0550   BUN 74* 10/18/2011 0550   CREATININE 4.63* 10/18/2011 0550   CALCIUM 9.0 10/18/2011 0550   GFRNONAA 9* 10/18/2011 0550   GFRAA 10* 10/18/2011 0550   ABG   Component Value Date/Time   PHART 7.373 10/14/2011 2146   PCO2ART 58.4* 10/14/2011 2146   PO2ART 52.6* 10/14/2011 2146   HCO3 33.1* 10/14/2011 2146   TCO2 34.9 10/14/2011 2146   ACIDBASEDEF 1.9 10/11/2011 2351   O2SAT 86.1 10/14/2011 2146    Lab 10/18/11 0550  MG 2.2   Lab Results  Component Value Date   CALCIUM 9.0 10/18/2011   PHOS 8.0* 10/18/2011    Chest Xray:   Assessment & Plan:  Sub-acute hypoxemic respiratory failure secondary to pulmonary edema/fluid overload. R/o effusion left - consider dc bipap as she ventilates normally Will investigate if used at home noctunral - Unable to perform CT due to renal function, unstable for V/Q scan, will scan lower ext and continue heparin drip.  D-dimer will surely be positive. - Heparin drip. Neg balance to conitnue dni May need [place] left chest assess for thora On HD now then titrate O2 down, likely can do this  Worsening CKD secondary to atrophic kideney, DM, HTN. Marginally negative fluid balance in spite of large doses of diuretics. Renal following, considering HD. Resolved hyperkalemia.  - HD per renal. - D/C Bumex GTT, consider to intermittent have paged renal to discuss  UTI with ESBL E. Coli resistant to Ciprofloxacin  - Continue primaxin for esbl, likely needs 10 days  Diabetes mellitus  - BCBGM+SSI  - tolerating diet.  Hypertension / CAD. No active ischemia / arrhythmia.  - Continue ASA, Plavix, Amlodipine, Clonidine, Hydralazine, Imdur, Metoprolol   Anemia, multifactorial.  - Trend Hct   Hypothyroidism  - Levothyroxin   Best practices / Disposition  - SDU status under PCCM  - LCB no intubation, CPR or cardioversion.. - Heparin for DVT Px  - Protonix for GI Px  -  diet  Disposition: 12/15 stepdown status since 12/14   Mcarthur Rossetti. Tyson Alias, MD, FACP Pgr: [phone] [facility]   Brett Canales Minor ACNP [facility] PCCM Pager [phone] till 3 pm If no answer page [phone] 10/18/2011,  2:32 PM

## 2011-10-18 NOTE — Progress Notes (Signed)
xxx

## 2011-10-18 NOTE — Progress Notes (Signed)
MEDICATION RELATED CONSULT NOTE - FOLLOW UP   Pharmacy Consult for: Heparin/Imipenem Indication: DVT/r/o PE, ESBL Ecoli UTI  Allergies  Allergen Reactions  . Novocain Other (See Comments)    Unknown   Vital Signs: Temp: 98 F (36.7 C) (12/15 0800) Temp src: Oral (12/15 0800) BP: 137/50 mmHg (12/15 0901) Pulse Rate: 97  (12/15 0901) Intake/Output from previous day: 12/14 0701 - 12/15 0700 In: 906 [P.O.:40; I.V.:516; IV Piggyback:350] Out: 2695 [Urine:2140] Intake/Output from this shift: Total I/O In: 58 [I.V.:48; IV Piggyback:10] Out: 1200 [Urine:1200]  Labs:  [facility] 10/18/11 0550 10/17/11 1558 10/17/11 0500 10/16/11 0830 10/16/11 0430  WBC 14.6* -- 16.4* -- 18.0*  HGB 8.5* -- 9.1* -- 9.6*  HCT 27.7* -- 29.4* -- 29.3*  PLT 404* -- 512* -- 555*  APTT -- -- -- -- --  CREATININE 4.63* -- 6.73* -- 6.08*  LABCREA -- -- -- -- --  CREATININE 4.63* -- 6.73* -- 6.08*  CREAT24HRUR -- -- -- -- --  MG 2.2 -- 2.3 -- 2.2  PHOS 8.0* -- 10.4* -- 8.1*  ALBUMIN 2.1* -- 2.2* 2.2* --  PROT -- -- 6.2 6.1 --  ALBUMIN 2.1* -- 2.2* 2.2* --  AST -- -- 11 11 --  ALT -- 10 11 13  --  ALKPHOS -- -- 138* 142* --  BILITOT -- -- 0.2* 0.2* --  BILIDIR -- -- -- 0.1 --  IBILI -- -- -- 0.1* --   Estimated Creatinine Clearance: 11.4 ml/min (by C-G formula based on Cr of 4.63).   Microbiology: Recent Results (from the past 720 hour(s))  MRSA PCR SCREENING     Status: Normal   Collection Time   10/11/11  9:25 PM      Component Value Range Status Comment   MRSA by PCR NEGATIVE  NEGATIVE  Final   URINE CULTURE     Status: Normal   Collection Time   10/12/11  3:18 PM      Component Value Range Status Comment   Specimen Description URINE, CATHETERIZED   Final    Special Requests NONE   Final    Setup Time [id number]   Final    Colony Count >=100,000 COLONIES/ML   Final    Culture     Final    Value: ESCHERICHIA COLI     Note: Confirmed Extended Spectrum Beta-Lactamase Producer (ESBL)   Note: CRITICAL RESULT CALLED TO, READ BACK BY AND VERIFIED WITH: LACEY HITT 12/11 AT 2250 BY DUNNJ   Report Status 10/14/2011 FINAL   Final    Organism ID, Bacteria ESCHERICHIA COLI   Final     Medications:  Heparin @ 1100 units/hr Imipenem 250mg  IV q12  Assessment: 70yof continues on heparin for DVT/r/o PE. Heparin level of 0.41 is therapeutic. Also on day #3 Imipenem for ESBL Ecoli UTI. Dose adjusted for HD.  Goal of Therapy:  Heparin level 0.3-0.7 Resolution of UTI  Plan:  1) Continue heparin at 1100 units/hr, f/u AM heparin level 2) Continue Imipenem 250mg  IV q12, f/u HD plans and UA  Fredrik Rigger 10/18/2011,10:19 AM

## 2011-10-19 ENCOUNTER — Inpatient Hospital Stay (HOSPITAL_COMMUNITY): Payer: Medicare Other

## 2011-10-19 DIAGNOSIS — E119 Type 2 diabetes mellitus without complications: Secondary | ICD-10-CM

## 2011-10-19 DIAGNOSIS — J9600 Acute respiratory failure, unspecified whether with hypoxia or hypercapnia: Secondary | ICD-10-CM

## 2011-10-19 DIAGNOSIS — J810 Acute pulmonary edema: Secondary | ICD-10-CM

## 2011-10-19 DIAGNOSIS — N179 Acute kidney failure, unspecified: Secondary | ICD-10-CM

## 2011-10-19 LAB — GLUCOSE, CAPILLARY
Glucose-Capillary: 263 mg/dL — ABNORMAL HIGH (ref 70–99)
Glucose-Capillary: 165 mg/dL — ABNORMAL HIGH (ref 70–99)
Glucose-Capillary: 192 mg/dL — ABNORMAL HIGH (ref 70–99)

## 2011-10-19 LAB — RENAL FUNCTION PANEL
CO2: 29 mEq/L (ref 19–32)
GFR calc Af Amer: 18 mL/min — ABNORMAL LOW (ref >90)
Glucose, Bld: 168 mg/dL — ABNORMAL HIGH (ref 70–99)
Potassium: 3.7 mEq/L (ref 3.5–5.1)
Sodium: 139 mEq/L (ref 135–145)

## 2011-10-19 LAB — CBC
HCT: 26.6 % — ABNORMAL LOW (ref 36.0–46.0)
Hemoglobin: 8.2 g/dL — ABNORMAL LOW (ref 12.0–15.0)
WBC: 16.9 10*3/uL — ABNORMAL HIGH (ref 4.0–10.5)

## 2011-10-19 MED ORDER — RENA-VITE PO TABS: 1.0000 | ORAL_TABLET | Freq: Every day | ORAL | Status: DC

## 2011-10-19 MED ORDER — FUROSEMIDE 10 MG/ML IJ SOLN
160.0000 mg | Freq: Three times a day (TID) | INTRAVENOUS | Status: DC
  Administered 2011-10-19 – 2011-10-21 (×5): 160 mg via INTRAVENOUS
  Filled 2011-10-19 (×12): qty 16

## 2011-10-19 MED ORDER — SODIUM CHLORIDE 0.9 % IJ SOLN
INTRAMUSCULAR | Status: AC
  Filled 2011-10-19: qty 20

## 2011-10-19 NOTE — Progress Notes (Signed)
PULMONARY PROGRESS NOTE  HPI:  70 yo WM with CKD secondary to atrophic kidney transferred from [facility] ED 12/08> presented with progressive weakness; treated with diuretics since admission but despite that developed increased oxygen requirements requiring NIMV and ICU transfer. PCCM was consulted to assist in management.  Antibiotics:   12/08 Ciprofloxacin (UTI)>>>12/11  12/11 Imipenem>>>  Cultures/Sepsis Markers:   12/09 UC>>>E.Coli resistant to Ciprofloxacin (ESBL)  Access/Protocols:  12/08 L IJ TLC>>>  12/08 Foley>>> 12/14 R IJ dialysis catheter>>>  Best Practice: DVT: Heparin drip GI: Protonix  Subjective:  Transferred to 3301 yest> still SOB w/ movement in bed...   MEDS:    . ipratropium  0.5 mg Nebulization TID   And  . albuterol  2.5 mg Nebulization TID  . amLODipine  10 mg Oral QHS  . aspirin  81 mg Oral Daily  . bumetanide  4 mg Intravenous Q12H  . calcium acetate  667 mg Oral TID WC  . cloNIDine  0.1 mg Oral TID  . clopidogrel  75 mg Oral Q breakfast  . docusate sodium  100 mg Oral BID  . guaiFENesin  600 mg Oral BID  . hydrALAZINE  50 mg Oral Q8H  . imipenem-cilastatin  250 mg Intravenous Q12H  . insulin aspart  0-20 Units Subcutaneous TID WC  . insulin aspart  0-5 Units Subcutaneous QHS  . insulin glargine  14 Units Subcutaneous QHS  . isosorbide mononitrate  120 mg Oral Daily  . levothyroxine  25 mcg Oral Q0600  . metolazone  10 mg Oral Daily  . metoprolol  50 mg Oral BID  . multivitamin  1 tablet Oral QHS  . pantoprazole (PROTONIX) IV  40 mg Intravenous Q24H  . polyethylene glycol  17 g Oral Daily  . sodium chloride        Physical Exam: Filed Vitals:   10/19/11 0800  BP: 110/44  Pulse: 84  Temp: 97.9 F (36.6 C)  Resp: 19    Intake/Output Summary (Last 24 hours) at 10/19/11 0939 Last data filed at 10/19/11 0800  Gross per 24 hour  Intake   1212 ml  Output   2231 ml  Net  -1019 ml    Vent Mode:  [-]  FiO2 (%):  [60 %] 60  %   Neuro: Alert and oriented, moving all extremities to command. Cardiac: RRR, Nl S1/S2, -M/R/G. Pulmonary:  decr BS w/ early consolid & e==>a left base GI: Soft, NT, ND and +BS. Extremities: no edema, skinny legs, ecchymoses, sl tender  Labs: CBC    Component Value Date/Time   WBC 16.9* 10/19/2011 0359   RBC 3.19* 10/19/2011 0359   HGB 8.2* 10/19/2011 0359   HCT 26.6* 10/19/2011 0359   PLT 449* 10/19/2011 0359   MCV 83.4 10/19/2011 0359   MCH 25.7* 10/19/2011 0359   MCHC 30.8 10/19/2011 0359   RDW 17.1* 10/19/2011 0359   LYMPHSABS 0.9 10/18/2011 0550   MONOABS 1.0 10/18/2011 0550   EOSABS 0.1 10/18/2011 0550   BASOSABS 0.0 10/18/2011 0550   BMET    Component Value Date/Time   NA 139 10/19/2011 0359   K 3.7 10/19/2011 0359   CL 99 10/19/2011 0359   CO2 29 10/19/2011 0359   GLUCOSE 168* 10/19/2011 0359   BUN 41* 10/19/2011 0359   CREATININE 2.92* 10/19/2011 0359   CALCIUM 8.9 10/19/2011 0359   GFRNONAA 15* 10/19/2011 0359   GFRAA 18* 10/19/2011 0359   ABG    Component Value Date/Time   PHART 7.373  10/14/2011 2146   PCO2ART 58.4* 10/14/2011 2146   PO2ART 52.6* 10/14/2011 2146   HCO3 33.1* 10/14/2011 2146   TCO2 34.9 10/14/2011 2146   ACIDBASEDEF 1.9 10/11/2011 2351   O2SAT 86.1 10/14/2011 2146    Lab 10/18/11 0550  MG 2.2   Lab Results  Component Value Date   CALCIUM 8.9 10/19/2011   PHOS 4.2 10/19/2011    Chest Xray:  10/19/11 >> Findings: Right central line tip proximal superior vena cava level.  Left central line tip proximal superior vena cava level directed laterally. No gross pneumothorax.  Asymmetric air space disease greatest on the left and most notable in the left lung base may represent infectious infiltrate with  atelectasis/consolidation left base. Pulmonary edema not excluded. Calcified aorta. Cardiomegaly.  IMPRESSION:  No significant change.     Assessment & Plan:  Sub-acute hypoxemic respiratory failure secondary to pulmonary  edema/fluid overload. R/o effusion left - consider dc bipap as she ventilates normally Will investigate if used at home noctunral - Unable to perform CT due to renal function, unstable for V/Q scan, will scan lower ext and continue heparin drip.  D-dimer will surely be positive. - Heparin drip. Neg balance to conitnue dni May need [place] left chest assess for thora On HD now then titrate O2 down, likely can do this -12/16: early consolid changes leftbase, needs assess for effusion as above; continue Primaxin, NEBS, Diuretics, etc...   Worsening CKD secondary to atrophic kideney, DM, HTN. Marginally negative fluid balance in spite of large doses of diuretics. Renal following, considering HD. Resolved hyperkalemia.  - HD per renal. - D/C Bumex gtt, consider to intermittent have paged renal to discuss  UTI with ESBL E. Coli resistant to Ciprofloxacin  - Continue primaxin for esbl, likely needs 10 days  Diabetes mellitus  - BCBGM+SSI  - tolerating diet.   Hypertension / CAD. No active ischemia / arrhythmia.  - Continue ASA, Plavix, Amlodipine, Clonidine, Hydralazine, Imdur, Metoprolol   Anemia, multifactorial.  - Trend Hct ==> 8.2 today.  Hypothyroidism  - Levothyroxine   Best practices / Disposition  - SDU status under PCCM  - LCB no intubation, CPR or cardioversion.. - Heparin for DVT Px  - Protonix for GI Px  -  Diet   Scott M. Kriste Basque, MD  10/19/2011, 9:39 AM

## 2011-10-19 NOTE — Progress Notes (Signed)
Physical Therapy Evaluation Patient Details Name: [name] [name] MRN: [id number] DOB: [birth year] Today's Date: 10/19/2011  Problem List:  Patient Active Problem List  Diagnoses  . Diabetes mellitus  . Peripheral arterial disease  . Hypercholesterolemia  . Hypothyroidism  . Mycotic toenails  . ARF (acute respiratory failure)  . Pleural effusion, bilateral  . Hyperkalemia  . AKI (acute kidney injury)  . CKD (chronic kidney disease) stage 5, GFR less than 15 ml/min  . DM (diabetes mellitus)  . COPD with acute exacerbation  . HTN (hypertension), malignant  . Anemia  . Cardiac enzymes elevated    Past Medical History:  Past Medical History  Diagnosis Date  . Diabetes mellitus   . Hypercholesterolemia   . Hypothyroidism   . Colitis   . Chronic kidney disease   . Coronary artery disease   . Hypertension   . GERD (gastroesophageal reflux disease)   . Myocardial infarction   . Peripheral vascular disease   . COPD (chronic obstructive pulmonary disease)   . Pneumonia    Past Surgical History:  Past Surgical History  Procedure Date  . Partial hysterectomy   . Tubal ligation   . Amputaton of right fifth toe   . Tonsillectomy     PT Assessment/Plan/Recommendation PT Assessment Clinical Impression Statement: 70 year old female admitted with hypoxemia and ARF presents with decreased functional mobility and impairments listed below; will benefit from acute PT to initiate rehab/strengthening, maximize independence and safety with mobility, in prep for dc to rehab setting PT Recommendation/Assessment: Patient will need skilled PT in the acute care venue PT Problem List: Decreased strength;Decreased activity tolerance;Decreased balance;Decreased mobility;Decreased knowledge of use of DME;Pain Barriers to Discharge: Decreased caregiver support PT Therapy Diagnosis : Generalized weakness;Difficulty walking PT Plan PT Frequency: Min 3X/week PT Treatment/Interventions: DME  instruction;Gait training;Functional mobility training;Therapeutic activities;Therapeutic exercise;Balance training;Patient/family education PT Recommendation Recommendations for Other Services: OT consult Follow Up Recommendations: Skilled nursing facility Equipment Recommended: Defer to next venue PT Goals  Acute Rehab PT Goals PT Goal Formulation: With patient Time For Goal Achievement: 2 weeks Pt will go Supine/Side to Sit: with supervision (with smooth transistion) PT Goal: Supine/Side to Sit - Progress: Progressing toward goal Pt will go Sit to Supine/Side: with supervision Pt will go Sit to Stand: with min assist Pt will go Stand to Sit: with min assist Pt will Transfer Bed to Chair/Chair to Bed: with min assist (without physical contact) Pt will Ambulate: 16 - 50 feet;with mod assist;with rolling walker  PT Evaluation Precautions/Restrictions  Precautions Precautions: Fall Restrictions Weight Bearing Restrictions: No Other Position/Activity Restrictions: Desats quickly with activity Prior Functioning  Home Living Lives With: Son (works 1 PM to 1 AM) Receives Help From: Family (though must be modified independent to dc home) Type of Home: House Home Layout: One level Home Access: Stairs to enter Entrance Stairs-Rails: Doctor, general practice of Steps: 5 Home Adaptive Equipment:  (to be determined) Additional Comments: Must  be modified Independent to dc home; will need a rehab stay Prior Function Level of Independence: Independent with basic ADLs Cognition Cognition Arousal/Alertness: Awake/alert Overall Cognitive Status: Appears within functional limits for tasks assessed Orientation Level: Oriented X4 Sensation/Coordination Sensation Light Touch: Appears Intact Coordination Gross Motor Movements are Fluid and Coordinated: Yes Fine Motor Movements are Fluid and Coordinated: Not tested Extremity Assessment RUE Assessment RUE Assessment: Within  Functional Limits LUE Assessment LUE Assessment: Within Functional Limits RLE Assessment RLE Assessment: Exceptions to [facility] RLE Strength RLE Overall Strength Comments: generalized weakness, requiring  physical assist for sit to stand LLE Assessment LLE Assessment: Exceptions to [facility] LLE Strength LLE Overall Strength Comments: generalized weakness, requiring physical assist for sit to stand Mobility (including Balance) Bed Mobility Bed Mobility: Yes Supine to Sit: 3: Mod assist;HOB elevated (Comment degrees);With rails (elevated approx 30 degrees) Supine to Sit Details (indicate cue type and reason): step-by-step cues for technique; very weak and deconditioned Sitting - Scoot to Edge of Bed: 3: Mod assist Sitting - Scoot to Edge of Bed Details (indicate cue type and reason): cues to weight shift for reciprocal scooting Transfers Transfers: Yes Sit to Stand: 2: Max assist;From bed Sit to Stand Details (indicate cue type and reason): cues to initiate with anterior weight shift; very weak, required knee blocking Stand to Sit: 2: Max assist;To chair/3-in-1 Stand to Sit Details: max physical assist to control descent Stand Pivot Transfers: 2: Max Actuary Details (indicate cue type and reason): cues for technique; pt appropriately stated she was anxious as she hadn't been OOB in a while; requiered knee blocking Ambulation/Gait Ambulation/Gait: No Stairs: No Wheelchair Mobility Wheelchair Mobility: No  Posture/Postural Control Posture/Postural Control: No significant limitations Exercise    End of Session PT - End of Session Activity Tolerance: Patient tolerated treatment well Patient left: in chair;with call bell in reach Nurse Communication: Mobility status for transfers General Behavior During Session: [facility] for tasks performed Cognition: [facility] for tasks performed  Van Clines [facility] [place], [place] [phone]  10/19/2011, 1:57 PM

## 2011-10-19 NOTE — Progress Notes (Signed)
ANTICOAGULATION CONSULT NOTE - Follow Up Consult  Pharmacy Consult for: Heparin Indication: acute DVT/r/o PE  Allergies  Allergen Reactions  . Novocain Other (See Comments)    Unknown    Patient Measurements: Height: 5\' 5"  (165.1 cm) Weight: 148 lb 9.4 oz (67.4 kg) IBW/kg (Calculated) : 57   Vital Signs: Temp: 97.9 F (36.6 C) (12/16 0800) Temp src: Oral (12/16 0800) BP: 110/44 mmHg (12/16 0800) Pulse Rate: 84  (12/16 0800)  Labs:  Basename 10/19/11 0359 10/18/11 0550 10/17/11 0500  HGB 8.2* 8.5* --  HCT 26.6* 27.7* 29.4*  PLT 449* 404* 512*  APTT -- -- --  LABPROT -- -- --  INR -- -- --  HEPARINUNFRC 0.33 0.41 0.32  CREATININE 2.92* 4.63* 6.73*  CKTOTAL -- -- --  CKMB -- -- --  TROPONINI -- -- --   Estimated Creatinine Clearance: 16.6 ml/min (by C-G formula based on Cr of 2.92).   Medications:  Heparin @ 1100 units/hr  Assessment: 70yof continues on heparin for acute DVT/r/o PE. Heparin level of 0.33 is therapeutic.   Goal of Therapy:  Heparin level 0.3-0.7 units/ml   Plan:  1) Continue heparin at 1100 units/hr 2) Follow up AM heparin level  Janace Litten, PharmD [phone] 10/19/2011,9:03 AM

## 2011-10-19 NOTE — Progress Notes (Signed)
S:Feels better and looks better.  Denies Abd pain O:BP 127/53  Pulse 84  Temp(Src) 98 F (36.7 C) (Oral)  Resp 15  Ht 5\' 5"  (1.651 m)  Wt 67.4 kg (148 lb 9.4 oz)  BMI 24.73 kg/m2  SpO2 93%  Intake/Output Summary (Last 24 hours) at 10/19/11 1224 Last data filed at 10/19/11 1154  Gross per 24 hour  Intake    951 ml  Output   1921 ml  Net   -970 ml   Weight change: -0.8 kg (-1 lb 12.2 oz) EAV:WUJWJ and alert  CVS:RRR Resp:decreased BS bases,bil crackles Abd:+BS  Mostly LLQ tenderness, no rebound Ext:no edema NEURO:Ox1 follows simple commands      . ipratropium  0.5 mg Nebulization TID   And  . albuterol  2.5 mg Nebulization TID  . amLODipine  10 mg Oral QHS  . aspirin  81 mg Oral Daily  . bumetanide  4 mg Intravenous Q12H  . calcium acetate  667 mg Oral TID WC  . cloNIDine  0.1 mg Oral TID  . clopidogrel  75 mg Oral Q breakfast  . docusate sodium  100 mg Oral BID  . guaiFENesin  600 mg Oral BID  . hydrALAZINE  50 mg Oral Q8H  . imipenem-cilastatin  250 mg Intravenous Q12H  . insulin aspart  0-20 Units Subcutaneous TID WC  . insulin aspart  0-5 Units Subcutaneous QHS  . insulin glargine  14 Units Subcutaneous QHS  . isosorbide mononitrate  120 mg Oral Daily  . levothyroxine  25 mcg Oral Q0600  . metolazone  10 mg Oral Daily  . metoprolol  50 mg Oral BID  . multivitamin  1 tablet Oral QHS  . pantoprazole (PROTONIX) IV  40 mg Intravenous Q24H  . polyethylene glycol  17 g Oral Daily  . sodium chloride       Dg Chest Port 1 View  10/19/2011  *RADIOLOGY REPORT*  Clinical Data: Shortness of breath.  PORTABLE CHEST - 1 VIEW  Comparison: 10/17/2011.  Findings: Right central line tip proximal superior vena cava level. Left central line tip proximal superior vena cava level directed laterally.  No gross pneumothorax.  Asymmetric air space disease greatest on the left and most notable in the left lung base may represent infectious infiltrate with atelectasis/consolidation  left base.  Pulmonary edema not excluded.  Calcified aorta.  Cardiomegaly.  IMPRESSION: No significant change.  Original Report Authenticated By: Fuller Canada, M.D.   BMET    Component Value Date/Time   NA 139 10/19/2011 0359   K 3.7 10/19/2011 0359   CL 99 10/19/2011 0359   CO2 29 10/19/2011 0359   GLUCOSE 168* 10/19/2011 0359   BUN 41* 10/19/2011 0359   CREATININE 2.92* 10/19/2011 0359   CALCIUM 8.9 10/19/2011 0359   GFRNONAA 15* 10/19/2011 0359   GFRAA 18* 10/19/2011 0359   CBC    Component Value Date/Time   WBC 16.9* 10/19/2011 0359   RBC 3.19* 10/19/2011 0359   HGB 8.2* 10/19/2011 0359   HCT 26.6* 10/19/2011 0359   PLT 449* 10/19/2011 0359   MCV 83.4 10/19/2011 0359   MCH 25.7* 10/19/2011 0359   MCHC 30.8 10/19/2011 0359   RDW 17.1* 10/19/2011 0359   LYMPHSABS 0.9 10/18/2011 0550   MONOABS 1.0 10/18/2011 0550   EOSABS 0.1 10/18/2011 0550   BASOSABS 0.0 10/18/2011 0550     Assessment:  1. 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 pulm edema 3. COPD 4. E  COLI UTI on imipenem 5. Anemia sec CKD and iron def 6.Sec HPTh 7. Poss DVT on rt on heparin 8. Abd pain LFT's and lipase nl   Plan: 1.Persistent pyuria of ? Significance, will check urine eosinophils 2.  Follow renal fx to see if will need more HD 3. Change to IV lasix 4. Renal diet 5. Renal vitamin  Chontel Warning T

## 2011-10-20 LAB — GLUCOSE, CAPILLARY
Glucose-Capillary: 171 mg/dL — ABNORMAL HIGH (ref 70–99)
Glucose-Capillary: 157 mg/dL — ABNORMAL HIGH (ref 70–99)

## 2011-10-20 LAB — CBC
MCH: 25.8 pg — ABNORMAL LOW (ref 26.0–34.0)
MCHC: 31.3 g/dL (ref 30.0–36.0)
Platelets: 395 10*3/uL (ref 150–400)

## 2011-10-20 LAB — RENAL FUNCTION PANEL
Calcium: 9 mg/dL (ref 8.4–10.5)
GFR calc Af Amer: 12 mL/min — ABNORMAL LOW (ref >90)
GFR calc non Af Amer: 10 mL/min — ABNORMAL LOW (ref >90)
Glucose, Bld: 135 mg/dL — ABNORMAL HIGH (ref 70–99)
Phosphorus: 5.9 mg/dL — ABNORMAL HIGH (ref 2.3–4.6)
Sodium: 130 mEq/L — ABNORMAL LOW (ref 135–145)

## 2011-10-20 MED ORDER — HYDRALAZINE HCL 50 MG PO TABS
50.0000 mg | ORAL_TABLET | Freq: Three times a day (TID) | ORAL | Status: DC
  Administered 2011-10-20 – 2011-10-21 (×3): 50 mg via ORAL
  Filled 2011-10-20 (×6): qty 1

## 2011-10-20 MED ORDER — DARBEPOETIN ALFA-POLYSORBATE 200 MCG/0.4ML IJ SOLN
200.0000 ug | Freq: Once | INTRAMUSCULAR | Status: DC
  Filled 2011-10-20: qty 0.4

## 2011-10-20 MED ORDER — DARBEPOETIN ALFA-POLYSORBATE 200 MCG/0.4ML IJ SOLN
200.0000 ug | INTRAMUSCULAR | Status: DC
  Filled 2011-10-20: qty 0.4

## 2011-10-20 MED ORDER — WARFARIN VIDEO
Freq: Once | Status: AC
  Administered 2011-10-20: 18:00:00

## 2011-10-20 MED ORDER — DARBEPOETIN ALFA-POLYSORBATE 200 MCG/0.4ML IJ SOLN: 200.0000 ug | INTRAMUSCULAR | Status: DC

## 2011-10-20 MED ORDER — HEPARIN SOD (PORCINE) IN D5W 100 UNIT/ML IV SOLN
1150.0000 [IU]/h | INTRAVENOUS | Status: DC
  Administered 2011-10-20 – 2011-10-24 (×4): 1150 [IU]/h via INTRAVENOUS
  Filled 2011-10-20 (×5): qty 250

## 2011-10-20 MED ORDER — COUMADIN BOOK
Freq: Once | Status: AC
  Administered 2011-10-20: 18:00:00
  Filled 2011-10-20: qty 1

## 2011-10-20 MED ORDER — SODIUM CHLORIDE 0.9 % IJ SOLN
INTRAMUSCULAR | Status: AC
  Administered 2011-10-20: 10 mL
  Filled 2011-10-20: qty 20

## 2011-10-20 MED ORDER — METOPROLOL TARTRATE 50 MG PO TABS
50.0000 mg | ORAL_TABLET | Freq: Two times a day (BID) | ORAL | Status: DC
  Administered 2011-10-20 – 2011-10-24 (×6): 50 mg via ORAL
  Filled 2011-10-20 (×9): qty 1

## 2011-10-20 MED ORDER — WARFARIN SODIUM 2.5 MG PO TABS
2.5000 mg | ORAL_TABLET | Freq: Once | ORAL | Status: AC
  Administered 2011-10-20: 2.5 mg via ORAL
  Filled 2011-10-20 (×2): qty 1

## 2011-10-20 NOTE — Progress Notes (Signed)
Subjective: Interval History: none.  Objective: Vital signs in last 24 hours: Temp:  [97.5 F (36.4 C)-98.7 F (37.1 C)] 97.5 F (36.4 C) (12/17 0744) Pulse Rate:  [81-101] 81  (12/17 0800) Resp:  [17-22] 22  (12/17 0800) BP: (87-144)/(33-58) 106/45 mmHg (12/17 0800) SpO2:  [81 %-97 %] 94 % (12/17 0945) FiO2 (%):  [100 %] 100 % (12/17 0945) Weight change:   Intake/Output from previous day: 12/16 0701 - 12/17 0700 In: 134 [I.V.:134] Out: 250 [Urine:250] Intake/Output this shift: Total I/O In: 275.4 [I.V.:275.4] Out: 350 [Urine:350]  General appearance: moderately obese and pale Resp: diminished breath sounds bilaterally and rales bibasilar Cardio: systolic murmur: systolic ejection 2/6, decrescendo at 2nd left intercostal space and irreg GI: obese, pos BS, liver down 5 cm Skin: pale, bruises  Lab Results:  [facility] 10/20/11 0439 10/19/11 0359  WBC 17.0* 16.9*  HGB 7.5* 8.2*  HCT 24.0* 26.6*  PLT 395 449*   BMET:  Basename 10/20/11 0432 10/19/11 0359  NA 130* 139  K 3.7 3.7  CL 91* 99  CO2 28 29  GLUCOSE 135* 168*  BUN 62* 41*  CREATININE 4.19* 2.92*  CALCIUM 9.0 8.9   No results found for this basename: PTH:2 in the last 72 hours Iron Studies: No results found for this basename: IRON,TIBC,TRANSFERRIN,FERRITIN in the last 72 hours  Studies/Results: Dg Chest Port 1 View  10/19/2011  *RADIOLOGY REPORT*  Clinical Data: Shortness of breath.  PORTABLE CHEST - 1 VIEW  Comparison: 10/17/2011.  Findings: Right central line tip proximal superior vena cava level. Left central line tip proximal superior vena cava level directed laterally.  No gross pneumothorax.  Asymmetric air space disease greatest on the left and most notable in the left lung base may represent infectious infiltrate with atelectasis/consolidation left base.  Pulmonary edema not excluded.  Calcified aorta.  Cardiomegaly.  IMPRESSION: No significant change.  Original Report Authenticated By: Fuller Canada, M.D.    I have reviewed the patient's current medications.  Assessment/Plan: 1 CKD with mild AKI, suspect may not recover.  Will follow urine today and if not better, HD and chronic plans.  Some vol xs. 2 Anemia, add EPO, check Fe 3 HPTH check 4 DM fair control 5 Obesity 6 Pneu per primary.   7 Debill  P follow renal fct, urine vol , clinical status    LOS: 9 days   Taleisha Kaczynski L 10/20/2011,12:19 PM

## 2011-10-20 NOTE — Progress Notes (Signed)
Name: [name] [name] MRN: [id number] DOB: 1941-03-17    LOS: 9  PCCM PROGRESS NOTE  History of Present Illness: 70 yo WM with CKD secondary to atrophic kidney transferred from [facility] ED on 12/08, where she presented with progressive weakness. She was treated with diuretics since admission but despite that developed increased oxygen requirements requiring NIMV and ICU transfer. PCCM was consulted to assist in management.   Lines / Drains: 12/08 L IJ TLC>>>  12/08 Foley>>>  12/14 R IJ dialysis catheter>>>  Cultures: 12/09 UC>>>E.Coli resistant to Ciprofloxacin (ESBL)  Antibiotics: 12/08 Ciprofloxacin (UTI)>>>12/11  12/11 Imipenem>>>  Tests / Events: 12/17  No acute events overnight,   Vital Signs: Temp:  [97.5 F (36.4 C)-98.7 F (37.1 C)] 97.5 F (36.4 C) (12/17 0744) Pulse Rate:  [81-101] 81  (12/17 0800) Resp:  [17-22] 22  (12/17 0800) BP: (87-144)/(33-58) 106/45 mmHg (12/17 0800) SpO2:  [81 %-97 %] 94 % (12/17 0945) FiO2 (%):  [100 %] 100 % (12/17 0945) I/O last 3 completed shifts: In: 475 [I.V.:475] Out: 475 [Urine:475]  Physical Examination: General:  No acute distress Neuro:  Somewhat confused but follows commands, nonfocal HEENT:  PERRL, facial abrasions form BiPAP mask Neck:  Intact lines   Cardiovascular:  RRR, no murmurs Lungs:  Bilateral diminished air entry Abdomen:  Soft, nontender, bowel sounds present Musculoskeletal:  No significant pedal edema Skin:  No rash  Ventilator settings: Vent Mode:  [-]  FiO2 (%):  [100 %] 100 %  Labs and Imaging:  Reviewed.  Please refer to the Assessment and Plan section for relevant results.  Assessment and Plan:  Sub-acute hypoxemic respiratory failure secondary to pulmonary edema/fluid overload.  Unable to r/o PE but on Heparin for acute DVT.  -->supplemental oxygen -->titrate to SpO2 >92%  Acute DVT R LE -->continue heparin -->start Coumadin per pharmacy  CKD secondary to atrophic kideney, DM, HTN.  No  evidence of hyperkalemia.  Lab 10/20/11 0432 10/19/11 0359 10/18/11 0550 10/17/11 0500 10/16/11 0430  CREATININE 4.19* 2.92* 4.63* 6.73* 6.08*    Lab 10/20/11 0432 10/19/11 0359 10/18/11 0550 10/17/11 0500 10/16/11 0430  K 3.7 3.7 3.2* 3.2* 3.0*   -->HD per renal -->Lasix  UTI with ESBL E. Coli resistant to Ciprofloxacin.  Lab 10/20/11 0439 10/19/11 0359 10/18/11 0550 10/17/11 0500 10/16/11 0430  WBC 17.0* 16.9* 14.6* 16.4* 18.0*   --> Continue Primaxin, stop date 12/21  Diabetes mellitus   Lab 10/20/11 1223 10/20/11 0823 10/19/11 2143 10/19/11 1706 10/19/11 1208  GLUCAP 157* 171* 220* 231* 192*   -->BCBGM+SSI  -->Lantus  Hypertension / CAD. No active ischemia / arrhythmia.  --> Continue ASA, Plavix, Amlodipine, Clonidine, Hydralazine, Imdur, Metoprolol   Anemia, multifactorial.   Lab 10/20/11 0439 10/19/11 0359 10/18/11 0550 10/17/11 0500 10/16/11 0430  HCT 24.0* 26.6* 27.7* 29.4* 29.3*   --> Trend Hct   Hypothyroidism  - Levothyroxin   Best practices / Disposition  -->SDU status under PCCM  -->LCB no intubation, CPR or cardioversion..  -->DVT Px is not indicated (Heparin gtt) -->GI Px is not indicated -->diet  -->family updated at bedside  Orlean Bradford, M.D. Pulmonary and Critical Care Medicine [facility] Cell: [phone] Pager: [phone]  10/20/2011, 12:42 PM

## 2011-10-20 NOTE — Progress Notes (Signed)
ANTICOAGULATION CONSULT NOTE - Follow Up Consult  Pharmacy Consult for Heparin Indication: acute DVT, r/o PE  Allergies  Allergen Reactions  . Novocain Other (See Comments)    Unknown    Patient Measurements: Height: 5\' 5"  (165.1 cm) Weight: 148 lb 9.4 oz (67.4 kg) IBW/kg (Calculated) : 57  Adjusted Body Weight:   Vital Signs: Temp: 97.5 F (36.4 C) (12/17 0744) Temp src: Oral (12/17 0744) BP: 106/45 mmHg (12/17 0800) Pulse Rate: 81  (12/17 0800)  Labs:  Basename 10/20/11 0439 10/20/11 0432 10/20/11 0430 10/19/11 0359 10/18/11 0550  HGB 7.5* -- -- 8.2* --  HCT 24.0* -- -- 26.6* 27.7*  PLT 395 -- -- 449* 404*  APTT -- -- -- -- --  LABPROT -- -- -- -- --  INR -- -- -- -- --  HEPARINUNFRC -- -- 0.30 0.33 0.41  CREATININE -- 4.19* -- 2.92* 4.63*  CKTOTAL -- -- -- -- --  CKMB -- -- -- -- --  TROPONINI -- -- -- -- --   Estimated Creatinine Clearance: 11.6 ml/min (by C-G formula based on Cr of 4.19).   Medications:  Heparin 1100 units/hr   Assessment: 70yof on heparin for acute DVT, r/o PE. Heparin level (0.3) is therapeutic but continues to trend down. Will increase rate slightly to avoid subtherapeutic level. - H/H low and trending down - No significant bleeding complications reported  Goal of Therapy:  Heparin level 0.3-0.7 units/ml   Plan:  1. Increase heparin to 1150 units/hr (11.5 ml/hr) 2. Follow-up AM heparin level  Cleon Dew [phone] 10/20/2011,10:47 AM

## 2011-10-20 NOTE — Progress Notes (Signed)
Attempted to wean pt off NRB mask to 50% venti.  Pt repositioned upright in bed and encouraged to cough.  Pt O2 sats down to 87%.  Pt placed back on NRB mask with return of O2 sats to 95%.  Will continue to monitor.   Roselie Awkward RN

## 2011-10-20 NOTE — Progress Notes (Signed)
ANTICOAGULATION CONSULT NOTE - Follow Up Consult  Pharmacy Consult for Coumadin Indication: acute DVT, r/o PE  Allergies  Allergen Reactions  . Novocain Other (See Comments)    Unknown    Patient Measurements: Height: 5\' 5"  (165.1 cm) Weight: 148 lb 9.4 oz (67.4 kg) IBW/kg (Calculated) : 57  Adjusted Body Weight:   Vital Signs: Temp: 98.4 F (36.9 C) (12/17 1200) Temp src: Oral (12/17 0744) BP: 119/42 mmHg (12/17 1200) Pulse Rate: 81  (12/17 0800)  Labs:  Basename 10/20/11 0439 10/20/11 0432 10/20/11 0430 10/19/11 0359 10/18/11 0550  HGB 7.5* -- -- 8.2* --  HCT 24.0* -- -- 26.6* 27.7*  PLT 395 -- -- 449* 404*  APTT -- -- -- -- --  LABPROT -- -- -- -- --  INR -- -- -- -- --  HEPARINUNFRC -- -- 0.30 0.33 0.41  CREATININE -- 4.19* -- 2.92* 4.63*  CKTOTAL -- -- -- -- --  CKMB -- -- -- -- --  TROPONINI -- -- -- -- --   Estimated Creatinine Clearance: 11.6 ml/min (by C-G formula based on Cr of 4.19).   Medications:  Heparin 1150 units/hr   Assessment: 70yof on heparin now to start Coumadin for acute DVT, r/o PE (unable to do CTA or VQ scan).  - Baseline INR 1; Warfarin pt 2 - Hg 7.5; AST/ALT wnl - No significant bleeding reported  Goal of Therapy:  INR 2-3   Plan:  1. Coumadin 2.5mg  po x 1 today 2. Daily PT/INR  3. Coumadin educational book/video  Cleon Dew [phone] 10/20/2011,1:30 PM

## 2011-10-20 NOTE — Progress Notes (Signed)
Hypotensive per RN.  Metoprolol / Hydralazine d/c'd.  Goal MAP 50-55.

## 2011-10-21 ENCOUNTER — Inpatient Hospital Stay (HOSPITAL_COMMUNITY): Payer: Medicare Other

## 2011-10-21 DIAGNOSIS — J810 Acute pulmonary edema: Secondary | ICD-10-CM

## 2011-10-21 DIAGNOSIS — N179 Acute kidney failure, unspecified: Secondary | ICD-10-CM

## 2011-10-21 DIAGNOSIS — J9600 Acute respiratory failure, unspecified whether with hypoxia or hypercapnia: Secondary | ICD-10-CM

## 2011-10-21 DIAGNOSIS — E119 Type 2 diabetes mellitus without complications: Secondary | ICD-10-CM

## 2011-10-21 LAB — HEPARIN LEVEL (UNFRACTIONATED): Heparin Unfractionated: 0.44 IU/mL (ref 0.30–0.70)

## 2011-10-21 LAB — DIFFERENTIAL
Basophils Absolute: 0.2 10*3/uL — ABNORMAL HIGH (ref 0.0–0.1)
Eosinophils Absolute: 0.4 10*3/uL (ref 0.0–0.7)
Lymphocytes Relative: 10 % — ABNORMAL LOW (ref 12–46)
Neutrophils Relative %: 79 % — ABNORMAL HIGH (ref 43–77)

## 2011-10-21 LAB — CBC
Platelets: 373 10*3/uL (ref 150–400)
RDW: 17 % — ABNORMAL HIGH (ref 11.5–15.5)
WBC: 19.3 10*3/uL — ABNORMAL HIGH (ref 4.0–10.5)

## 2011-10-21 LAB — IRON AND TIBC
Iron: 37 ug/dL — ABNORMAL LOW (ref 42–135)
Saturation Ratios: 20 % (ref 20–55)
TIBC: 189 ug/dL — ABNORMAL LOW (ref 250–470)

## 2011-10-21 LAB — COMPREHENSIVE METABOLIC PANEL
AST: 11 U/L (ref 0–37)
Albumin: 2.1 g/dL — ABNORMAL LOW (ref 3.5–5.2)
Calcium: 8.8 mg/dL (ref 8.4–10.5)
Creatinine, Ser: 4.9 mg/dL — ABNORMAL HIGH (ref 0.50–1.10)
GFR calc non Af Amer: 8 mL/min — ABNORMAL LOW (ref >90)

## 2011-10-21 LAB — PROTIME-INR: INR: 1 (ref 0.00–1.49)

## 2011-10-21 LAB — PHOSPHORUS: Phosphorus: 7.5 mg/dL — ABNORMAL HIGH (ref 2.3–4.6)

## 2011-10-21 LAB — GLUCOSE, CAPILLARY
Glucose-Capillary: 116 mg/dL — ABNORMAL HIGH (ref 70–99)
Glucose-Capillary: 125 mg/dL — ABNORMAL HIGH (ref 70–99)

## 2011-10-21 MED ORDER — WARFARIN SODIUM 2.5 MG PO TABS
2.5000 mg | ORAL_TABLET | Freq: Once | ORAL | Status: AC
  Administered 2011-10-21: 2.5 mg via ORAL
  Filled 2011-10-21: qty 1

## 2011-10-21 MED ORDER — ALPRAZOLAM 0.5 MG PO TABS
ORAL_TABLET | ORAL | Status: AC
  Administered 2011-10-21: 0.25 mg via ORAL
  Filled 2011-10-21: qty 1

## 2011-10-21 MED ORDER — DARBEPOETIN ALFA-POLYSORBATE 200 MCG/0.4ML IJ SOLN
200.0000 ug | Freq: Once | INTRAMUSCULAR | Status: AC
  Administered 2011-10-21: 200 ug via SUBCUTANEOUS

## 2011-10-21 MED ORDER — HEPARIN SODIUM (PORCINE) 1000 UNIT/ML DIALYSIS
100.0000 [IU]/kg | INTRAMUSCULAR | Status: DC | PRN
  Administered 2011-10-21: 7000 [IU] via INTRAVENOUS_CENTRAL
  Filled 2011-10-21: qty 7

## 2011-10-21 MED ORDER — SODIUM CHLORIDE 0.9 % IJ SOLN
INTRAMUSCULAR | Status: AC
  Administered 2011-10-21: 20 mL
  Filled 2011-10-21: qty 20

## 2011-10-21 NOTE — Progress Notes (Signed)
Subjective: Interval History: has complaints no appetite.  Objective: Vital signs in last 24 hours: Temp:  [97.4 F (36.3 C)-98.4 F (36.9 C)] 97.4 F (36.3 C) (12/18 0800) Pulse Rate:  [80-90] 80  (12/18 0800) Resp:  [17-22] 18  (12/18 0800) BP: (106-154)/(42-65) 129/53 mmHg (12/18 0800) SpO2:  [91 %-97 %] 97 % (12/18 0913) FiO2 (%):  [60 %] 60 % (12/18 0800) Weight:  [69.5 kg (153 lb 3.5 oz)] 153 lb 3.5 oz (69.5 kg) (12/18 0400) Weight change:   Intake/Output from previous day: 12/17 0701 - 12/18 0700 In: 2047.9 [P.O.:550; I.V.:733.9; IV Piggyback:764] Out: 2075 [Urine:2075] Intake/Output this shift: Total I/O In: 11.5 [I.V.:11.5] Out: 325 [Urine:325]  General appearance: alert and pale Resp: diminished breath sounds bilaterally and rales bibasilar Cardio: S1, S2 normal and systolic murmur: systolic ejection 2/6, decrescendo at 2nd left intercostal space GI: Obese, liver down 4 cm  Lab Results:  [facility] 10/21/11 0350 10/20/11 0439  WBC 19.3* 17.0*  HGB 7.3* 7.5*  HCT 23.2* 24.0*  PLT 373 395   BMET:  Basename 10/21/11 0350 10/20/11 0432  NA 127* 130*  K 3.5 3.7  CL 84* 91*  CO2 26 28  GLUCOSE 106* 135*  BUN 76* 62*  CREATININE 4.90* 4.19*  CALCIUM 8.8 9.0   No results found for this basename: PTH:2 in the last 72 hours Iron Studies: No results found for this basename: IRON,TIBC,TRANSFERRIN,FERRITIN in the last 72 hours  Studies/Results: No results found.  I have reviewed the patient's current medications.  Assessment/Plan: 1 CKD/AKI no better clearance , still vol xs despite Lasix, low S Na.  Needs RRT,probably chronic 2 Anemia EPO, fe P 3 HPTH check 4 DM fair control 5 COPD 6 DVT on Hep 7 Debill need to address 8 Htn lower meds, lower vol P as above    LOS: 10 days   Rice Walsh L 10/21/2011,11:54 AM

## 2011-10-21 NOTE — Progress Notes (Signed)
Physical Therapy Treatment Patient Details Name: [name] [name] MRN: [id number] DOB: 25-May-1941 Today's Date: 10/21/2011  PT Assessment/Plan  PT - Assessment/Plan Comments on Treatment Session: Pt tolerated treatement well.  Pt on partial rebreather mask with FiO2 60%.  Desated to 91% only with treatment. PT Plan: Discharge plan remains appropriate;Frequency remains appropriate PT Frequency: Min 3X/week Follow Up Recommendations: Skilled nursing facility Equipment Recommended: Defer to next venue PT Goals  Acute Rehab PT Goals PT Goal Formulation: With patient Time For Goal Achievement: 2 weeks Pt will go Supine/Side to Sit: with supervision PT Goal: Supine/Side to Sit - Progress: Progressing toward goal Pt will go Sit to Supine/Side: with supervision PT Goal: Sit to Supine/Side - Progress: Progressing toward goal Pt will go Sit to Stand: with min assist PT Goal: Sit to Stand - Progress: Progressing toward goal Pt will go Stand to Sit: with min assist PT Goal: Stand to Sit - Progress: Progressing toward goal Pt will Transfer Bed to Chair/Chair to Bed: with min assist PT Transfer Goal: Bed to Chair/Chair to Bed - Progress: Progressing toward goal Pt will Ambulate: 16 - 50 feet;with mod assist;with rolling walker PT Goal: Ambulate - Progress: Progressing toward goal  PT Treatment Precautions/Restrictions  Precautions Precautions: Fall Required Braces or Orthoses: No Restrictions Weight Bearing Restrictions: No Other Position/Activity Restrictions: Desats quickly with activity.  Pt on partial rebreather mask with FiO2 (60%). Pain 0/10 with treatment. Mobility (including Balance) Bed Mobility Bed Mobility: Yes Supine to Sit: 4: Min assist;HOB elevated (Comment degrees) (HOB 45 degrees.) Supine to Sit Details (indicate cue type and reason): Assist for trunk to translate anterior over BOS.  Cues for sequence. Sitting - Scoot to Edge of Bed: 4: Min assist Sitting - Scoot to [place] of  Bed Details (indicate cue type and reason): Assist to scoot hips reciprocally with cues for sequence. Transfers Transfers: Yes Sit to Stand: 3: Mod assist;From bed;With upper extremity assist Sit to Stand Details (indicate cue type and reason): Assist at sacrum to translate trunk over BOS with cues for sequence.  Blocked bilateral knees to prevent buckling.  Cues for sequence. Stand to Sit: 2: Max assist;To chair/3-in-1;With upper extremity assist Stand to Sit Details: Assist at sacrum to slow descent of trunk to chair with cues for hand placement while blocking to bilateral knees. Stand Pivot Transfers: 3: Mod assist Stand Pivot Transfer Details (indicate cue type and reason): Assist to facilitate rotation of trunk while blocking bilateral knees to prevent buckling.  Cues for sequence and safety. Ambulation/Gait Ambulation/Gait: No Stairs: No Wheelchair Mobility Wheelchair Mobility: No  Posture/Postural Control Posture/Postural Control: No significant limitations Balance Balance Assessed: No End of Session PT - End of Session Equipment Utilized During Treatment: Gait belt Activity Tolerance: Patient tolerated treatment well Patient left: in chair;with call bell in reach Nurse Communication: Mobility status for transfers General Behavior During Session: [facility] for tasks performed Cognition: [facility] for tasks performed  Cephus Shelling 10/21/2011, 10:16 AM  10/21/2011 Cephus Shelling, PT, DPT [phone]

## 2011-10-21 NOTE — Progress Notes (Signed)
ANTIBIOTIC/ANTICOAGULATION CONSULT NOTE - FOLLOW UP  Pharmacy Consult for Heparin, Coumadin, Primaxin Indication: DVT, r/o PE; E coli UTI  Allergies  Allergen Reactions  . Novocain Other (See Comments)    Unknown    Patient Measurements: Height: 5\' 5"  (165.1 cm) Weight: 153 lb 3.5 oz (69.5 kg) IBW/kg (Calculated) : 57  Adjusted Body Weight:   Vital Signs: Temp: 97.4 F (36.3 C) (12/18 0800) Temp src: Oral (12/18 0800) BP: 129/53 mmHg (12/18 0800) Pulse Rate: 80  (12/18 0800) Intake/Output from previous day: 12/17 0701 - 12/18 0700 In: 2047.9 [P.O.:550; I.V.:733.9; IV Piggyback:764] Out: 2075 [Urine:2075] Intake/Output from this shift: Total I/O In: 11.5 [I.V.:11.5] Out: 175 [Urine:175]  Labs:  [facility] 10/21/11 0350 10/20/11 0439 10/20/11 0432 10/19/11 0359  WBC 19.3* 17.0* -- 16.9*  HGB 7.3* 7.5* -- 8.2*  PLT 373 395 -- 449*  LABCREA -- -- -- --  CREATININE 4.90* -- 4.19* 2.92*   Estimated Creatinine Clearance: 10.8 ml/min (by C-G formula based on Cr of 4.9). No results found for this basename: VANCOTROUGH:2,VANCOPEAK:2,VANCORANDOM:2,GENTTROUGH:2,GENTPEAK:2,GENTRANDOM:2,TOBRATROUGH:2,TOBRAPEAK:2,TOBRARND:2,AMIKACINPEAK:2,AMIKACINTROU:2,AMIKACIN:2, in the last 72 hours   Microbiology: Recent Results (from the past 720 hour(s))  MRSA PCR SCREENING     Status: Normal   Collection Time   10/11/11  9:25 PM      Component Value Range Status Comment   MRSA by PCR NEGATIVE  NEGATIVE  Final   URINE CULTURE     Status: Normal   Collection Time   10/12/11  3:18 PM      Component Value Range Status Comment   Specimen Description URINE, CATHETERIZED   Final    Special Requests NONE   Final    Setup Time [id number]   Final    Colony Count >=100,000 COLONIES/ML   Final    Culture     Final    Value: ESCHERICHIA COLI     Note: Confirmed Extended Spectrum Beta-Lactamase Producer (ESBL)     Note: CRITICAL RESULT CALLED TO, READ BACK BY AND VERIFIED WITH: LACEY HITT 12/11  AT 2250 BY DUNNJ   Report Status 10/14/2011 FINAL   Final    Organism ID, Bacteria ESCHERICHIA COLI   Final     Anti-infectives     Start     Dose/Rate Route Frequency Ordered Stop   10/15/11 1000   imipenem-cilastatin (PRIMAXIN) 250 mg in sodium chloride 0.9 % 100 mL IVPB        250 mg 200 mL/hr over 30 Minutes Intravenous Every 12 hours 10/15/11 0022     10/15/11 0030   imipenem-cilastatin (PRIMAXIN) 250 mg in sodium chloride 0.9 % 100 mL IVPB        250 mg 200 mL/hr over 30 Minutes Intravenous NOW 10/15/11 0022 10/15/11 0203   10/14/11 1800   ciprofloxacin (CIPRO) tablet 500 mg  Status:  Discontinued        500 mg Oral Daily 10/14/11 1623 10/14/11 2333   10/11/11 2359   moxifloxacin (AVELOX) IVPB 400 mg  Status:  Discontinued        400 mg 250 mL/hr over 60 Minutes Intravenous Every 24 hours 10/11/11 2306 10/13/11 1700         Meds:  Heparin 1150 units/hr Primaxin 250mg  IV q12h  Assessment: 1. 70yof on Heparin bridging to Coumadin for acute DVT, r/o PE. INR (1) is subtherapeutic as expected after Coumadin 2.5mg  x1. Heparin level (0.44) is therapeutic after slight rate increase yesterday.  - Hg 7.3, Plts 373 - No significant bleeding reported  2.  Pt is also on Primaxin Day 6 for E coli (ESBL) UTI resistant to Cipro but susceptible to Primaxin. Per CCM note, Primaxin to continue through 12/21. Pt has remained afebrile but WBC have trended up (19.3). Renal function remains poo (CrCl 10). Pt is receiving Primaxin IV 250mg  q12h - dose appropriate for CrCl 6-20/receiving HD.   Goal of Therapy:  INR 2-3 Heparin level 0.3-0.7  Plan:  1. Continue heparin 1150 units/hr (11.5 ml/hr) 2. Repeat Coumadin 2.5mg  po x 1 today 3. Continue Primaxin 250mg  IV q12h through 12/21  Cleon Dew [phone] 10/21/2011,10:40 AM

## 2011-10-21 NOTE — Progress Notes (Signed)
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 She was treated with diuretics since admission but despite that developed increased oxygen requirements requiring NIMV and ICU transfer. PCCM was consulted to assist in management.   Lines / Drains: 12/08 L IJ TLC>>>  12/08 Foley>>>  12/14 R IJ dialysis catheter>>>  Cultures: 12/09 UC>>>E.Coli resistant to Ciprofloxacin (ESBL)  Antibiotics: 12/08 Ciprofloxacin (UTI)>>>12/11  12/11 Imipenem>>>  Tests / Events: 12/17  No acute events overnight,   Vital Signs: Temp:  [97.4 F (36.3 C)-98.4 F (36.9 C)] 97.4 F (36.3 C) (12/18 0800) Pulse Rate:  [80-90] 80  (12/18 0800) Resp:  [17-22] 18  (12/18 0800) BP: (106-154)/(42-65) 129/53 mmHg (12/18 0800) SpO2:  [91 %-97 %] 97 % (12/18 0913) FiO2 (%):  [60 %] 60 % (12/18 0800) Weight:  [69.5 kg (153 lb 3.5 oz)] 153 lb 3.5 oz (69.5 kg) (12/18 0400) I/O last 3 completed shifts: In: 2057.9 [P.O.:550; I.V.:743.9; IV Piggyback:764] Out: 2075 [Urine:2075]  Physical Examination: General:  Resting comfotably Neuro:  Awake, alert, appropriate  HEENT:  PERRL, facial abrasions form BiPAP mask, NRB mask on Neck:  Intact lines   Cardiovascular:  RRR, no murmurs Lungs:  Bilateral diminished air entry, scattered rales Abdomen:  Soft, nontender, bowel sounds present Musculoskeletal:  No significant pedal edema Skin:  No rash  Ventilator settings: Vent Mode:  [-]  FiO2 (%):  [60 %] 60 %  Labs and Imaging:  Reviewed.  Please refer to the Assessment and Plan section for relevant results.  Assessment and Plan:  Sub-acute hypoxemic respiratory failure secondary to pulmonary edema/fluid overload.  Unable to r/o PE but on Heparin for acute DVT.  -->supplemental oxygen -->titrate to SpO2 >92%  Acute DVT R  LE -->continue heparin, will d/c once INR therapeutic -->Coumadin per pharmacy  CKD secondary to atrophic kideney, DM, HTN.  No evidence of hyperkalemia.  Lab 10/21/11 0350 10/20/11 0432 10/19/11 0359 10/18/11 0550 10/17/11 0500  CREATININE 4.90* 4.19* 2.92* 4.63* 6.73*    Lab 10/21/11 0350 10/20/11 0432 10/19/11 0359 10/18/11 0550 10/17/11 0500  K 3.5 3.7 3.7 3.2* 3.2*   -->HD per renal -->Lasix  UTI with ESBL E. Coli resistant to Ciprofloxacin, persistent WBC, afibrille  Lab 10/21/11 0350 10/20/11 0439 10/19/11 0359 10/18/11 0550 10/17/11 0500  WBC 19.3* 17.0* 16.9* 14.6* 16.4*   -->Continue Primaxin, stop date 12/21 -->repeat CXR -->considering ID consult if no improvement  Diabetes mellitus, fair control  Lab 10/21/11 0814 10/20/11 2159 10/20/11 1743 10/20/11 1223 10/20/11 0823  GLUCAP 116* 159* 102* 157* 171*   -->BCBGM+SSI  -->Lantus  Hypertension / CAD. No active ischemia / arrhythmia.  --> Continue ASA, Plavix, Amlodipine, Clonidine, Hydralazine, Imdur, Metoprolol   Anemia, multifactorial.   Lab 10/21/11 0350 10/20/11 0439 10/19/11 0359 10/18/11 0550 10/17/11 0500  HCT 23.2* 24.0* 26.6* 27.7* 29.4*   -->Trend Hct  -->EPO per Renal  Hypothyroidism  - Levothyroxin   Best practices / Disposition  -->SDU status under PCCM  -->LCB no intubation, no CPR no cardioversion -->DVT Px is not indicated (Heparin gtt) -->GI Px is not indicated --Don Perking, M.D. Pulmonary and Critical Care Medicine [facility] Cell: [phone] Pager: [phone]  10/21/2011, 11:16 AM

## 2011-10-21 NOTE — Progress Notes (Signed)
UR Completed.  [name] [name] Jane [phone] 10/21/2011  

## 2011-10-21 NOTE — Progress Notes (Signed)
Patient ID: [name] [name], female   DOB: [birth year], 70 y.o.   MRN: [id number] I was present at this session.  I have reviewed the session itself and made appropriate changes. Cath marginal, replace soon.  Cindel Daugherty L 12/18/20121:36 PM

## 2011-10-22 ENCOUNTER — Inpatient Hospital Stay (HOSPITAL_COMMUNITY): Payer: Medicare Other

## 2011-10-22 DIAGNOSIS — N186 End stage renal disease: Secondary | ICD-10-CM

## 2011-10-22 DIAGNOSIS — D72829 Elevated white blood cell count, unspecified: Secondary | ICD-10-CM

## 2011-10-22 LAB — RENAL FUNCTION PANEL
Albumin: 2.4 g/dL — ABNORMAL LOW (ref 3.5–5.2)
BUN: 48 mg/dL — ABNORMAL HIGH (ref 6–23)
GFR calc Af Amer: 14 mL/min — ABNORMAL LOW (ref >90)
GFR calc non Af Amer: 12 mL/min — ABNORMAL LOW (ref >90)
Phosphorus: 6.3 mg/dL — ABNORMAL HIGH (ref 2.3–4.6)
Potassium: 4.2 mEq/L (ref 3.5–5.1)
Sodium: 135 mEq/L (ref 135–145)

## 2011-10-22 LAB — CBC
HCT: 24.6 % — ABNORMAL LOW (ref 36.0–46.0)
Hemoglobin: 7.9 g/dL — ABNORMAL LOW (ref 12.0–15.0)
RBC: 3.01 MIL/uL — ABNORMAL LOW (ref 3.87–5.11)
WBC: 34.2 10*3/uL — ABNORMAL HIGH (ref 4.0–10.5)

## 2011-10-22 LAB — URINALYSIS, ROUTINE W REFLEX MICROSCOPIC
Glucose, UA: NEGATIVE mg/dL
Ketones, ur: NEGATIVE mg/dL
Specific Gravity, Urine: 1.011 (ref 1.005–1.030)
pH: 5.5 (ref 5.0–8.0)

## 2011-10-22 LAB — URINE MICROSCOPIC-ADD ON

## 2011-10-22 LAB — HEPARIN LEVEL (UNFRACTIONATED)
Heparin Unfractionated: 0.84 IU/mL — ABNORMAL HIGH (ref 0.30–0.70)
Heparin Unfractionated: 0.71 IU/mL — ABNORMAL HIGH (ref 0.30–0.70)
Heparin Unfractionated: 0.61 IU/mL (ref 0.30–0.70)

## 2011-10-22 LAB — PROTIME-INR
INR: 1.21 (ref 0.00–1.49)
Prothrombin Time: 15.6 seconds — ABNORMAL HIGH (ref 11.6–15.2)

## 2011-10-22 LAB — GLUCOSE, CAPILLARY
Glucose-Capillary: 159 mg/dL — ABNORMAL HIGH (ref 70–99)
Glucose-Capillary: 96 mg/dL (ref 70–99)

## 2011-10-22 LAB — PARATHYROID HORMONE, INTACT (NO CA): PTH: 367.5 pg/mL — ABNORMAL HIGH (ref 14.0–72.0)

## 2011-10-22 MED ORDER — VANCOMYCIN 50 MG/ML ORAL SOLUTION
125.0000 mg | Freq: Four times a day (QID) | ORAL | Status: DC
  Administered 2011-10-22: 125 mg via ORAL
  Filled 2011-10-22 (×4): qty 2.5

## 2011-10-22 MED ORDER — HEPARIN SODIUM (PORCINE) 1000 UNIT/ML DIALYSIS: 40.0000 [IU]/kg | Freq: Once | INTRAMUSCULAR | Status: DC

## 2011-10-22 MED ORDER — FERUMOXYTOL INJECTION 510 MG/17 ML
510.0000 mg | Freq: Once | INTRAVENOUS | Status: AC
  Administered 2011-10-22: 510 mg via INTRAVENOUS
  Filled 2011-10-22 (×2): qty 17

## 2011-10-22 MED ORDER — WARFARIN SODIUM 2.5 MG PO TABS
2.5000 mg | ORAL_TABLET | Freq: Once | ORAL | Status: DC
  Filled 2011-10-22: qty 1

## 2011-10-22 MED ORDER — SODIUM CHLORIDE 0.9 % IJ SOLN
INTRAMUSCULAR | Status: AC
  Administered 2011-10-22: 40 mL
  Filled 2011-10-22: qty 20

## 2011-10-22 MED ORDER — RENA-VITE PO TABS: 1.0000 | ORAL_TABLET | Freq: Every day | ORAL | Status: DC

## 2011-10-22 MED ORDER — SODIUM CHLORIDE 0.9 % IJ SOLN
INTRAMUSCULAR | Status: AC
  Administered 2011-10-22: 20 mL
  Filled 2011-10-22: qty 20

## 2011-10-22 MED ORDER — SODIUM CHLORIDE 0.9 % IJ SOLN
INTRAMUSCULAR | Status: AC
  Filled 2011-10-22: qty 20

## 2011-10-22 NOTE — Progress Notes (Signed)
Discussed in the long length of stay meeting Larsen Dungan Weeks 10/22/2011  

## 2011-10-22 NOTE — Progress Notes (Signed)
   CARE MANAGEMENT NOTE 10/22/2011  Patient:  [name] [name],[name] [name]   Account Number:  [id number]  Date Initiated:  10/14/2011  Documentation initiated by:  [name]  Subjective/Objective Assessment:   PT WAS ADMITTED WITH SOB/ HYPERGLYCEMIA/HYPERKALEMIA     Action/Plan:   PROGRESSION OF CARE AND DISCHARGE PLANNING   Anticipated DC Date:  10/28/2011   Anticipated DC Plan:  SKILLED NURSING FACILITY  In-house referral  Clinical Social Worker      DC Planning Services  CM consult      Choice offered to / List presented to:             Status of service:  In process, will continue to follow Medicare Important Message given?   (If response is "NO", the following Medicare IM given date fields will be blank) Date Medicare IM given:   Date Additional Medicare IM given:    Discharge Disposition:    Per UR Regulation:  Reviewed for med. necessity/level of care/duration of stay  Comments:  10/22/11 [name],RN,BSN PER PT/OT NOTES, APPEARS PT WILL NEED SNF LEVEL OF CARE AT DISCHARGE.  WILL CONSULT CSW TO FACILITATE DC TO SNF WHEN MEDICALLY STABLE FOR DC. Phone #[phone]   10-21-11 11am [name], RNBSN - [phone] UR completed.  Now on intermediate unit - 60% face mask - IV lasix, IV antibiotics and new tx - heparin IV for DVT and possible PE's.  10-15-11 3:50pm [name] - [phone] UR Completed - tx to ICU with resp distress - on bipap - requiring IV bumex - ? prob need for dialysis. renal following.  UR COMPLETED 10/14/2011 [name], RN, BSN [id number]  PT WAS ADMITTED WITH RF/ HYPERGLYCEMIA AND HYPERKALEMIA. PTA PT WAS AT HOME WITH SELF CARE.

## 2011-10-22 NOTE — Progress Notes (Signed)
Discussed in the long length of stay meeting [name] [name] Weeks 10/22/2011  

## 2011-10-22 NOTE — Consult Note (Signed)
Date of Admission:  10/11/2011  Date of Consult:  10/22/2011  Reason for ConsultLeukocytosis Referring Physician:Dr. Zubelevitskiy    HPI: [name] [name] is an 70 y.o. female is patient with ESR who  was hospitalized at [facility] hospital about 2 weeks PTA , for presumed  PNA. She says she had fluid in one side of the lung. She apparently completed course of abx including   Cefuroxime. 4 days pta. However, she has continued to feel unwell and weak, with some abdominal pain,shortness of breath.. At [place] ED patient was found to have bilateral pleural effusions with atelectasis, in setting of acute on chronic renal failure, BUN/Cr 77/4.97  On admission, she was admitted to the hospital and had  urine cultures drawn which grew ESBL (she endorses now having had pain with urination, though I dont see this documented on the admission H and P. In any case she was started on pirmaxin. She is currently receiving HD.  In the interim she has developed worsening leukocytosis without clear cause. She had "diarrhea" this am but this was in fact only one large loose bowel movement.    Past Medical History  Diagnosis Date  . Diabetes mellitus   . Hypercholesterolemia   . Hypothyroidism   . Colitis   . Chronic kidney disease   . Coronary artery disease   . Hypertension   . GERD (gastroesophageal reflux disease)   . Myocardial infarction   . Peripheral vascular disease   . COPD (chronic obstructive pulmonary disease)   . Pneumonia     Past Surgical History  Procedure Date  . Partial hysterectomy   . Tubal ligation   . Amputaton of right fifth toe   . Tonsillectomy   ergies:   Allergies  Allergen Reactions  . Novocain Other (See Comments)    Unknown     Medications: I have reviewed patients current medications as documented in Epic Anti-infectives     Start     Dose/Rate Route Frequency Ordered Stop   10/22/11 1400   vancomycin (VANCOCIN) 50 mg/mL oral solution 125 mg  Status:  Discontinued          125 mg Oral 4 times daily 10/22/11 1219 10/22/11 1726   10/15/11 1000   imipenem-cilastatin (PRIMAXIN) 250 mg in sodium chloride 0.9 % 100 mL IVPB        250 mg 200 mL/hr over 30 Minutes Intravenous Every 12 hours 10/15/11 0022     10/15/11 0030   imipenem-cilastatin (PRIMAXIN) 250 mg in sodium chloride 0.9 % 100 mL IVPB        250 mg 200 mL/hr over 30 Minutes Intravenous NOW 10/15/11 0022 10/15/11 0203   10/14/11 1800   ciprofloxacin (CIPRO) tablet 500 mg  Status:  Discontinued        500 mg Oral Daily 10/14/11 1623 10/14/11 2333   10/11/11 2359   moxifloxacin (AVELOX) IVPB 400 mg  Status:  Discontinued        400 mg 250 mL/hr over 60 Minutes Intravenous Every 24 hours 10/11/11 2306 10/13/11 1700          Social History:  reports that she quit smoking about 4 months ago. Her smoking use included Cigarettes. She has a 40 pack-year smoking history. She does not have any smokeless tobacco history on file. She reports that she does not drink alcohol or use illicit drugs.  No family history on file.  As in HPI and primary teams notes otherwise 12 point review of systems is negative  Blood pressure 99/57, pulse 93, temperature 98.3 F (36.8 C), temperature source Oral, resp. rate 18, height 5\' 5"  (1.651 m), weight 150 lb 9.2 oz (68.3 kg), SpO2 93.00%. General: Alert and awake, oriented x3, not in any acute distress. HEENT: anicteric sclera, pupils reactive to light and accommodation, EOMI, oropharynx clear and without exudate CVS regular rate, normal r,  no murmur rubs or gallops Chest: clear to auscultation bilaterally, no wheezing, rales or rhonchi Abdomen: soft nontender, nondistended, normal bowel sounds, Extremities: no  clubbing or edema noted bilaterally Skin: no rashes Neuro: nonfocal, strength and sensation intact   Results for orders placed during the hospital encounter of 10/11/11 (from the past 48 hour(s))  GLUCOSE, CAPILLARY     Status: Abnormal   Collection  Time   10/20/11  5:43 PM      Component Value Range Comment   Glucose-Capillary 102 (*) 70 - 99 (mg/dL)    Comment 1 Notify RN     GLUCOSE, CAPILLARY     Status: Abnormal   Collection Time   10/20/11  9:59 PM      Component Value Range Comment   Glucose-Capillary 159 (*) 70 - 99 (mg/dL)   CBC     Status: Abnormal   Collection Time   10/21/11  3:50 AM      Component Value Range Comment   WBC 19.3 (*) 4.0 - 10.5 (K/uL)    RBC 2.86 (*) 3.87 - 5.11 (MIL/uL)    Hemoglobin 7.3 (*) 12.0 - 15.0 (g/dL)    HCT 40.9 (*) 81.1 - 46.0 (%)    MCV 81.1  78.0 - 100.0 (fL)    MCH 25.5 (*) 26.0 - 34.0 (pg)    MCHC 31.5  30.0 - 36.0 (g/dL)    RDW 91.4 (*) 78.2 - 15.5 (%)    Platelets 373  150 - 400 (K/uL)   HEPARIN LEVEL (UNFRACTIONATED)     Status: Normal   Collection Time   10/21/11  3:50 AM      Component Value Range Comment   Heparin Unfractionated 0.44  0.30 - 0.70 (IU/mL)   DIFFERENTIAL     Status: Abnormal   Collection Time   10/21/11  3:50 AM      Component Value Range Comment   Neutrophils Relative 79 (*) 43 - 77 (%)    Lymphocytes Relative 10 (*) 12 - 46 (%)    Monocytes Relative 8  3 - 12 (%)    Eosinophils Relative 2  0 - 5 (%)    Basophils Relative 1  0 - 1 (%)    Neutro Abs 15.3 (*) 1.7 - 7.7 (K/uL)    Lymphs Abs 1.9  0.7 - 4.0 (K/uL)    Monocytes Absolute 1.5 (*) 0.1 - 1.0 (K/uL)    Eosinophils Absolute 0.4  0.0 - 0.7 (K/uL)    Basophils Absolute 0.2 (*) 0.0 - 0.1 (K/uL)    RBC Morphology POLYCHROMASIA PRESENT      WBC Morphology     TOXIC GRANULATION   Value: MODERATE LEFT SHIFT (>5% METAS AND MYELOS,OCC PRO NOTED)  PHOSPHORUS     Status: Abnormal   Collection Time   10/21/11  3:50 AM      Component Value Range Comment   Phosphorus 7.5 (*) 2.3 - 4.6 (mg/dL)   COMPREHENSIVE METABOLIC PANEL     Status: Abnormal   Collection Time   10/21/11  3:50 AM      Component Value Range Comment   Sodium 127 (*)  135 - 145 (mEq/L)    Potassium 3.5  3.5 - 5.1 (mEq/L)    Chloride 84  (*) 96 - 112 (mEq/L)    CO2 26  19 - 32 (mEq/L)    Glucose, Bld 106 (*) 70 - 99 (mg/dL)    BUN 76 (*) 6 - 23 (mg/dL)    Creatinine, Ser 5.28 (*) 0.50 - 1.10 (mg/dL)    Calcium 8.8  8.4 - 10.5 (mg/dL)    Total Protein 5.8 (*) 6.0 - 8.3 (g/dL)    Albumin 2.1 (*) 3.5 - 5.2 (g/dL)    AST 11  0 - 37 (U/L)    ALT 5  0 - 35 (U/L)    Alkaline Phosphatase 95  39 - 117 (U/L)    Total Bilirubin 0.2 (*) 0.3 - 1.2 (mg/dL)    GFR calc non Af Amer 8 (*) >90 (mL/min)    GFR calc Af Amer 10 (*) >90 (mL/min)   IRON AND TIBC     Status: Abnormal   Collection Time   10/21/11  3:50 AM      Component Value Range Comment   Iron 37 (*) 42 - 135 (ug/dL)    TIBC 413 (*) 244 - 470 (ug/dL)    Saturation Ratios 20  20 - 55 (%)    UIBC 152  125 - 400 (ug/dL)   PROTIME-INR     Status: Normal   Collection Time   10/21/11  3:50 AM      Component Value Range Comment   Prothrombin Time 13.4  11.6 - 15.2 (seconds)    INR 1.00  0.00 - 1.49    GLUCOSE, CAPILLARY     Status: Abnormal   Collection Time   10/21/11  8:14 AM      Component Value Range Comment   Glucose-Capillary 116 (*) 70 - 99 (mg/dL)    Comment 1 Notify RN      Comment 2 Documented in Chart     PARATHYROID HORMONE, INTACT (NO CA)     Status: Abnormal   Collection Time   10/21/11 12:30 PM      Component Value Range Comment   PTH 367.5 (*) 14.0 - 72.0 (pg/mL)   GLUCOSE, CAPILLARY     Status: Abnormal   Collection Time   10/21/11  4:53 PM      Component Value Range Comment   Glucose-Capillary 125 (*) 70 - 99 (mg/dL)   GLUCOSE, CAPILLARY     Status: Abnormal   Collection Time   10/21/11 10:00 PM      Component Value Range Comment   Glucose-Capillary 134 (*) 70 - 99 (mg/dL)    Comment 1 Documented in Chart      Comment 2 Notify RN     CBC     Status: Abnormal   Collection Time   10/22/11  4:30 AM      Component Value Range Comment   WBC 34.2 (*) 4.0 - 10.5 (K/uL)    RBC 3.01 (*) 3.87 - 5.11 (MIL/uL)    Hemoglobin 7.9 (*) 12.0 - 15.0  (g/dL)    HCT 01.0 (*) 27.2 - 46.0 (%)    MCV 81.7  78.0 - 100.0 (fL)    MCH 26.2  26.0 - 34.0 (pg)    MCHC 32.1  30.0 - 36.0 (g/dL)    RDW 53.6 (*) 64.4 - 15.5 (%)    Platelets 394  150 - 400 (K/uL)   HEPARIN LEVEL (UNFRACTIONATED)     Status:  Abnormal   Collection Time   10/22/11  4:30 AM      Component Value Range Comment   Heparin Unfractionated 0.84 (*) 0.30 - 0.70 (IU/mL)   PROTIME-INR     Status: Abnormal   Collection Time   10/22/11  4:30 AM      Component Value Range Comment   Prothrombin Time 15.6 (*) 11.6 - 15.2 (seconds)    INR 1.21  0.00 - 1.49    RENAL FUNCTION PANEL     Status: Abnormal   Collection Time   10/22/11  4:30 AM      Component Value Range Comment   Sodium 135  135 - 145 (mEq/L)    Potassium 4.2  3.5 - 5.1 (mEq/L)    Chloride 94 (*) 96 - 112 (mEq/L)    CO2 26  19 - 32 (mEq/L)    Glucose, Bld 155 (*) 70 - 99 (mg/dL)    BUN 48 (*) 6 - 23 (mg/dL)    Creatinine, Ser 1.61 (*) 0.50 - 1.10 (mg/dL)    Calcium 8.9  8.4 - 10.5 (mg/dL)    Phosphorus 6.3 (*) 2.3 - 4.6 (mg/dL)    Albumin 2.4 (*) 3.5 - 5.2 (g/dL)    GFR calc non Af Amer 12 (*) >90 (mL/min)    GFR calc Af Amer 14 (*) >90 (mL/min)   GLUCOSE, CAPILLARY     Status: Abnormal   Collection Time   10/22/11  7:22 AM      Component Value Range Comment   Glucose-Capillary 159 (*) 70 - 99 (mg/dL)    Comment 1 Notify RN      Comment 2 Documented in Chart     HEPARIN LEVEL (UNFRACTIONATED)     Status: Abnormal   Collection Time   10/22/11  8:55 AM      Component Value Range Comment   Heparin Unfractionated 0.71 (*) 0.30 - 0.70 (IU/mL)   GLUCOSE, CAPILLARY     Status: Abnormal   Collection Time   10/22/11 12:58 PM      Component Value Range Comment   Glucose-Capillary 155 (*) 70 - 99 (mg/dL)    Comment 1 Notify RN      Comment 2 Documented in Chart     HEPARIN LEVEL (UNFRACTIONATED)     Status: Normal   Collection Time   10/22/11  1:30 PM      Component Value Range Comment   Heparin  Unfractionated 0.61  0.30 - 0.70 (IU/mL)   URINALYSIS, ROUTINE W REFLEX MICROSCOPIC     Status: Abnormal   Collection Time   10/22/11  3:31 PM      Component Value Range Comment   Color, Urine YELLOW  YELLOW     APPearance CLEAR  CLEAR     Specific Gravity, Urine 1.011  1.005 - 1.030     pH 5.5  5.0 - 8.0     Glucose, UA NEGATIVE  NEGATIVE (mg/dL)    Hgb urine dipstick NEGATIVE  NEGATIVE     Bilirubin Urine SMALL (*) NEGATIVE     Ketones, ur NEGATIVE  NEGATIVE (mg/dL)    Protein, ur NEGATIVE  NEGATIVE (mg/dL)    Urobilinogen, UA 1.0  0.0 - 1.0 (mg/dL)    Nitrite NEGATIVE  NEGATIVE     Leukocytes, UA LARGE (*) NEGATIVE    URINE MICROSCOPIC-ADD ON     Status: Normal   Collection Time   10/22/11  3:31 PM      Component Value Range Comment  Squamous Epithelial / LPF RARE  RARE     WBC, UA 7-10  <3 (WBC/hpf)    RBC / HPF 0-2  <3 (RBC/hpf)    Bacteria, UA RARE  RARE    GLUCOSE, CAPILLARY     Status: Abnormal   Collection Time   10/22/11  4:35 PM      Component Value Range Comment   Glucose-Capillary 122 (*) 70 - 99 (mg/dL)    Comment 1 Notify RN      Comment 2 Documented in Chart         Component Value Date/Time   SDES URINE, CATHETERIZED 10/12/2011 1518   SPECREQUEST NONE 10/12/2011 1518   CULT  Value: ESCHERICHIA COLI Note: Confirmed Extended Spectrum Beta-Lactamase Producer (ESBL) Note: CRITICAL RESULT CALLED TO, READ BACK BY AND VERIFIED WITH: LACEY HITT 12/11 AT 2250 BY DUNNJ 10/12/2011 1518   REPTSTATUS 10/14/2011 FINAL 10/12/2011 1518   Dg Chest Port 1 View  10/22/2011  *RADIOLOGY REPORT*  Clinical Data: Pneumonia, follow-up  PORTABLE CHEST - 1 VIEW  Comparison: Portable chest x-ray of 10/19/2011  Findings: Opacity remains at the left lung base most consistent with effusion and atelectasis.  Pneumonia cannot be excluded.  The right lung is clear.  Bilateral IJ central venous lines are unchanged in position.  The heart is borderline enlarged.  IMPRESSION: No change in opacity  primarily at the left lung base consistent with effusion, atelectasis, and possibly pneumonia.  Original Report Authenticated By: Juline Patch, M.D.     Recent Results (from the past 720 hour(s))  MRSA PCR SCREENING     Status: Normal   Collection Time   10/11/11  9:25 PM      Component Value Range Status Comment   MRSA by PCR NEGATIVE  NEGATIVE  Final   URINE CULTURE     Status: Normal   Collection Time   10/12/11  3:18 PM      Component Value Range Status Comment   Specimen Description URINE, CATHETERIZED   Final    Special Requests NONE   Final    Setup Time [id number]   Final    Colony Count >=100,000 COLONIES/ML   Final    Culture     Final    Value: ESCHERICHIA COLI     Note: Confirmed Extended Spectrum Beta-Lactamase Producer (ESBL)     Note: CRITICAL RESULT CALLED TO, READ BACK BY AND VERIFIED WITH: LACEY HITT 12/11 AT 2250 BY DUNNJ   Report Status 10/14/2011 FINAL   Final    Organism ID, Bacteria ESCHERICHIA COLI   Final      Impression/Recommendation  70 year old with ESRD, volume overload, being rx for ESBL with carbapenem now with unexplained but impressive leukocytosis:  1) Leukocytosis: --check CT chest abdomen and pelvis --I dcd the empriic vanco for  C diff because we had not been able to send c diff pcr on stool --(if she has evidence of colitisi with ileus on CT would add it back with IV flagyl)  2) ESBL in urine: She has had greater than 7 days of carbapenem -IF CT is negative would dc it  3) IP maintain contact precautions for ESBL   Thank you so much for this interesting consult,   Acey Lav 10/22/2011, 5:26 PM   [phone] (pager) [phone] (office)

## 2011-10-22 NOTE — Progress Notes (Signed)
Name: [name] [name] MRN: [id number] DOB: 08-25-1941    LOS: 11  PCCM PROGRESS NOTE  History of Present Illness: 70 yo WM with CKD secondary to atrophic kidney transferred from [facility] ED on 12/08, where she presented with progressive weakness. She was treated with diuretics since admission but despite that developed increased oxygen requirements requiring NIMV and ICU transfer. PCCM was consulted to assist in management.   Lines / Drains: 12/08 L IJ TLC>>>  12/08 Foley>>>  12/14 R IJ dialysis catheter>>>  Cultures: 12/09 UC>>>E.Coli resistant to Ciprofloxacin (ESBL)  Antibiotics: 12/08 Ciprofloxacin (UTI)>>>12/11  12/11 Imipenem>>>  Tests / Events: 12/19  CXR>>>bibasilar densities (? Effusions / atelectasis / infiltrate)  Overnight:  Able to titrate oxygen down to 50% VTM.  Chest pain yesterday with HD, now resolved.  Diarrhea started overnight per RN.  Vital Signs: Temp:  [97.1 F (36.2 C)-98.3 F (36.8 C)] 98.3 F (36.8 C) (12/19 0800) Pulse Rate:  [64-95] 89  (12/19 0800) Resp:  [17-21] 18  (12/19 1000) BP: (74-157)/(31-79) 157/65 mmHg (12/19 0800) SpO2:  [86 %-100 %] 86 % (12/19 1000) FiO2 (%):  [50 %-60 %] 50 % (12/19 1145) Weight:  [67.7 kg (149 lb 4 oz)-69.9 kg (154 lb 1.6 oz)] 150 lb 9.2 oz (68.3 kg) (12/19 0300) I/O last 3 completed shifts: In: 1172 [I.V.:904; IV Piggyback:268] Out: 4280 [Urine:2480; Other:1800]  Physical Examination: General:  Not in distress Neuro:  Awake, alert and oriented HEENT:  PERRL Neck:  Intact lines, with no exudate / erythema Cardiovascular:  RRR, no murmurs Lungs:  Bilateral diminished air entry, rales, no wheezes Abdomen:  Soft, nontender, bowel sounds present Musculoskeletal:  No significant pedal edema Skin:  No rash  Ventilator settings: Vent Mode:  [-]  FiO2 (%):  [50 %-60 %] 50 %  Labs and Imaging:  Reviewed.  Please refer to the Assessment and Plan section for relevant results.  Assessment and Plan:  Sub-acute  hypoxemic respiratory failure secondary to pulmonary edema/fluid overload. CXR>>>bibasilar densities, etiology unclear. Unable to r/o PE but on Heparin for acute DVT. Decreasing oxygen requirements. -->supplemental oxygen -->titrate to SpO2 >88%  Acute DVT R LE -->continue heparin -->will hold Coumadin awaiting placement of permanent HD access, then restart  CKD secondary to atrophic kideney, DM, HTN.  No evidence of hyperkalemia.  Lab 10/22/11 0430 10/21/11 0350 10/20/11 0432 10/19/11 0359 10/18/11 0550  CREATININE 3.69* 4.90* 4.19* 2.92* 4.63*    Lab 10/22/11 0430 10/21/11 0350 10/20/11 0432 10/19/11 0359 10/18/11 0550  K 4.2 3.5 3.7 3.7 3.2*   -->HD per renal -->awaiting placement of permanent access -->Lasix  UTI with ESBL E. Coli resistant to Ciprofloxacin, persistent WBC, afebrile.  Now diarrhea, suspect C.dif.  Lab 10/22/11 0430 10/21/11 0350 10/20/11 0439 10/19/11 0359 10/18/11 0550  WBC 34.2* 19.3* 17.0* 16.9* 14.6*   -->Continue Primaxin, stop date 12/21 -->Start PO vancomycin  -->ID consultation requested -->UA, UCx, BCx  Diabetes mellitus, fair control  Lab 10/22/11 0722 10/21/11 2200 10/21/11 1653 10/21/11 0814 10/20/11 2159  GLUCAP 159* 134* 125* 116* 159*   -->BCBGM+SSI  -->Lantus  Hypertension / CAD. No active ischemia / arrhythmia.  --> Continue ASA, Plavix, Amlodipine, Clonidine, Hydralazine, Imdur, Metoprolol   Anemia, multifactorial.   Lab 10/22/11 0430 10/21/11 0350 10/20/11 0439 10/19/11 0359 10/18/11 0550  HCT 24.6* 23.2* 24.0* 26.6* 27.7*   -->Trend Hct  -->EPO per Renal  Hypothyroidism  --> Levothyroxin   Best practices / Disposition  -->SDU status under PCCM -->Renal, ID consulting -->LCB no intubation,  no CPR no cardioversion -->DVT Px is not indicated (Heparin gtt) -->GI Px is not indicated -->diet   Orlean Bradford, M.D. Pulmonary and Critical Care Medicine [facility] [place] Cell: [phone] Pager: [phone]  10/22/2011, 11:59 AM

## 2011-10-22 NOTE — Progress Notes (Addendum)
ANTICOAGULATION CONSULT NOTE - Follow Up Consult  Pharmacy Consult for Heparin and Coumadin Indication: DVT, r/o PE  Allergies  Allergen Reactions  . Novocain Other (See Comments)    Unknown    Patient Measurements: Height: 5\' 5"  (165.1 cm) Weight: 150 lb 9.2 oz (68.3 kg) IBW/kg (Calculated) : 57  Adjusted Body Weight:   Vital Signs: Temp: 98.3 F (36.8 C) (12/19 0800) Temp src: Oral (12/19 0800) BP: 157/65 mmHg (12/19 0800) Pulse Rate: 89  (12/19 0800)  Labs:  [name] [name] 10/22/11 0855 10/22/11 0430 10/21/11 0350 10/20/11 0439 10/20/11 0432  HGB -- 7.9* 7.3* -- --  HCT -- 24.6* 23.2* 24.0* --  PLT -- 394 373 395 --  APTT -- -- -- -- --  LABPROT -- 15.6* 13.4 -- --  INR -- 1.21 1.00 -- --  HEPARINUNFRC 0.71* 0.84* 0.44 -- --  CREATININE -- 3.69* 4.90* -- 4.19*  CKTOTAL -- -- -- -- --  CKMB -- -- -- -- --  TROPONINI -- -- -- -- --   Estimated Creatinine Clearance: 13.1 ml/min (by C-G formula based on Cr of 3.69).   Medications:  Heparin 1150 units/hr  Assessment: 70yof on Heparin bridging to Coumadin (Day 3 of minimum 5 overlap) for acute DVT, r/o PE. Heparin level (0.84) this morning was supratherapeutic. Heparin level was rechecked to confirm accuracy and result decreased to 0.71 but still just above goal. Pt received heparin 7000 units during HD yesterday - may be seeing some lingering effects though the bolus should be cleared. Since pt has an acute DVT, will leave heparin rate the same and check follow-up level later today. INR (1.21) is subtherapeutic but trended up with Coumadin 2.5mg .  - H/H and Plts improved - No bleeding complications reported  Goal of Therapy:  INR 2-3 Heparin level 0.3-0.7 units/ml   Plan:  1. Continue heparin 1150 units/hr (11.5 ml/hr) 2. Check heparin level @ 1330 today to confirm decreasing into therapeutic range 3. Coumadin 2.5mg  po x 1 today 4. Follow-up AM INR and heparin level  [name] [name] [phone] 10/22/2011,10:36 AM   Addendum:  Recheck heparin level (0.61) has now decreased into therapeutic range. Earlier levels affected by HD heparin given 12/18. Will continue with heparin drip 1150 units/hr and follow-up AM heparin level. Noted plans to hold Coumadin until HD access obtained.  14:32    10/22/2011  [name] [name], PharmD

## 2011-10-22 NOTE — Progress Notes (Signed)
Clinical Social Worker completed psychosocial assessment, please see shadow chart for details.  CSW faxed pt out to [facility].  CSW to continue to follow and assist as needed.  Angelia Mould, MSW, [place] [phone]

## 2011-10-22 NOTE — Progress Notes (Addendum)
Subjective: Interval History: none.better this am  Objective: Vital signs in last 24 hours: Temp:  [97.1 F (36.2 C)-98.3 F (36.8 C)] 98.3 F (36.8 C) (12/19 0800) Pulse Rate:  [64-95] 89  (12/19 0800) Resp:  [17-21] 18  (12/19 1000) BP: (74-157)/(31-79) 157/65 mmHg (12/19 0800) SpO2:  [86 %-100 %] 86 % (12/19 1000) FiO2 (%):  [50 %-60 %] 60 % (12/19 1000) Weight:  [67.7 kg (149 lb 4 oz)-69.9 kg (154 lb 1.6 oz)] 150 lb 9.2 oz (68.3 kg) (12/19 0300) Weight change: 0.4 kg (14.1 oz)  Intake/Output from previous day: 12/18 0701 - 12/19 0700 In: 638 [I.V.:536; IV Piggyback:102] Out: 3230 [Urine:1430] Intake/Output this shift: Total I/O In: 334.5 [P.O.:250; I.V.:84.5] Out: 225 [Urine:225]  General appearance: pale Resp: diminished breath sounds bilaterally and rales LLL Cardio: S1, S2 normal and systolic murmur: holosystolic 2/6, blowing at apex GI: obes, liver down 4 cm  Lab Results:  Basename 10/22/11 0430 10/21/11 0350  WBC 34.2* 19.3*  HGB 7.9* 7.3*  HCT 24.6* 23.2*  PLT 394 373   BMET:  Basename 10/22/11 0430 10/21/11 0350  NA 135 127*  K 4.2 3.5  CL 94* 84*  CO2 26 26  GLUCOSE 155* 106*  BUN 48* 76*  CREATININE 3.69* 4.90*  CALCIUM 8.9 8.8   No results found for this basename: PTH:2 in the last 72 hours Iron Studies:  Basename 10/21/11 0350  IRON 37*  TIBC 189*  TRANSFERRIN --  FERRITIN --    Studies/Results: Dg Chest Port 1 View  10/22/2011  *RADIOLOGY REPORT*  Clinical Data: Pneumonia, follow-up  PORTABLE CHEST - 1 VIEW  Comparison: Portable chest x-ray of 10/19/2011  Findings: Opacity remains at the left lung base most consistent with effusion and atelectasis.  Pneumonia cannot be excluded.  The right lung is clear.  Bilateral IJ central venous lines are unchanged in position.  The heart is borderline enlarged.  IMPRESSION: No change in opacity primarily at the left lung base consistent with effusion, atelectasis, and possibly pneumonia.  Original  Report Authenticated By: Juline Patch, M.D.    I have reviewed the patient's current medications.  Assessment/Plan: 1 CKD no recovery, will make tentative plans for chronic.  Will get PC,perm access 2 Pneu better, ?po meds 3 Anemia EPO, Fe low, bolus 4 DM fair control 5 COPD 6 DVT anticoag  P HD, PC, access, Fe, EPO    LOS: 11 days   DETERDING,JAMES L 10/22/2011,11:34 AM    Consult [facility] for permanent access (message left [place] with [facility]- PC 1-2 days and perm access 3-4 days)

## 2011-10-22 NOTE — Consult Note (Addendum)
[facility] [place]  Referred by:  Dr. Darrick Penna  Reason for referral: New access  History of Present Illness  [name] [name] is a 70 y.o. female who presents for evaluation for permanent access.  The patient is right hand dominant.  The patient has not had previous access procedures.  Previous central venous cannulation procedures include: RIJ trivascular dialysis catheter and LIJ TDC.  The patient has not had a PPM placed.  The patient notes she has had some kidney problems, but had not been seeing a nephrologist.  She is unclear of the etiology of her renal failure.  She had not undergone any education in regards to arteriovenous fistula vs arteriovenous graft, so she has not opinion yet if she wants permanent access placement.  Past Medical History  Diagnosis Date  . Diabetes mellitus   . Hypercholesterolemia   . Hypothyroidism   . Colitis   . Chronic kidney disease   . Coronary artery disease   . Hypertension   . GERD (gastroesophageal reflux disease)   . Myocardial infarction   . Peripheral vascular disease   . COPD (chronic obstructive pulmonary disease)   . Pneumonia     Past Surgical History  Procedure Date  . Partial hysterectomy   . Tubal ligation   . Amputaton of right fifth toe   . Tonsillectomy     History   Social History  . Marital Status: Widowed    Spouse Name: N/A    Number of Children: N/A  . Years of Education: N/A   Occupational History  . Not on file.   Social History Main Topics  . Smoking status: Former Smoker -- 1.0 packs/day for 40 years    Types: Cigarettes    Quit date: 06/11/2011  . Smokeless tobacco: Not on file  . Alcohol Use: No  . Drug Use: No  . Sexually Active: Yes    Birth Control/ Protection: Post-menopausal   Other Topics Concern  . Not on file   Social History Narrative  . No narrative on file    No family history on file.  No current facility-administered medications on file prior to  encounter.   No current outpatient prescriptions on file prior to encounter.    Allergies  Allergen Reactions  . Novocain Other (See Comments)    Unknown    REVIEW OF SYSTEMS:  (Positives indicated with an "x", otherwise negative)  CARDIOVASCULAR: [ ]  chest pain    [ ]  chest pressure    [ ]  palpitations    [ ]  orthopnea   [x]  dyspnea on exert. [ ]  claudication    [ ]  rest pain     [ ]  DVT     [ ]  phlebitis  PULMONARY:    [ ]  productive cough [ ]  asthma  [ ]  wheezing  NEUROLOGIC:    [x]  weakness    [ ]  paresthesias   [ ]  aphasia    [ ]  amaurosis    [ ]  dizziness  HEMATOLOGIC:    [ ]  bleeding problems  [ ]  clotting disorders  MUSCULOSKEL: [ ]  joint pain     [ ]  joint swelling  GASTROINTEST:  [ ]   blood in stool   [ ]   hematemesis  GENITOURINARY:   [ ]   dysuria    [ ]   hematuria  PSYCHIATRIC:   [ ]  history of major depression  INTEGUMENTARY: [ ]  rashes    [ ]  ulcers  CONSTITUTIONAL:  [ ]  fever     [ ]   chills  Physical Examination  Filed Vitals:   10/22/11 0831 10/22/11 1000 10/22/11 1145 10/22/11 1435  BP:   153/62 153/62  Pulse:   96 87  Temp:   98.1 F (36.7 C)   TempSrc:   Oral   Resp:  18 18 17   Height:      Weight:      SpO2: 94% 86% 100% 90%   Body mass index is 25.06 kg/(m^2).  General: A&O x 3, WDWN  Head: [place]/AT  Ear/Nose/Throat: Hearing grossly intact, nares w/o erythema or drainage, oropharynx w/o Erythema/Exudate  Eyes: PERRLA, EOMI  Neck: Supple, no nuchal rigidity, no palpable LAD, Trivascular TDC right neck, CVL left neck  Pulmonary: Sym exp, good air movt, +rales, no rhonchi, & wheezing  Cardiac: RRR, Nl S1, S2, no Murmurs, rubs or gallops  Vascular: Vessel Right Left  Radial Palpable Palpable  Brachial Palpable Palpable  Carotid Palpable, without bruit Palpable, without bruit  Aorta Non-palpable N/A  Femoral Palpable Palpable  Popliteal Non-palpable Non-palpable  PT Faintly Palpable Faintly Palpable  DP Faintly Palpable Faintly  Palpable   Gastrointestinal: soft, NT, + distended, -G/R, - HSM, - masses, - CVAT B  Musculoskeletal: M/S 4/5 throughout, globally weakened, Extremities without ischemic changes   Neurologic: CN 2-12 intact , Pain and light touch intact in extremities , Motor exam as listed above  Psychiatric: Judgment intact, Mood & affect appropriate for pt's clinical situation  Dermatologic: See M/S exam for extremity exam, no rashes otherwise noted, echymoses in both arms  Lymph : No Cervical, Axillary, or Inguinal lymphadenopathy   Laboratory CBC    Component Value Date/Time   WBC 34.2* 10/22/2011 0430   RBC 3.01* 10/22/2011 0430   HGB 7.9* 10/22/2011 0430   HCT 24.6* 10/22/2011 0430   PLT 394 10/22/2011 0430   MCV 81.7 10/22/2011 0430   MCH 26.2 10/22/2011 0430   MCHC 32.1 10/22/2011 0430   RDW 16.9* 10/22/2011 0430   LYMPHSABS 1.9 10/21/2011 0350   MONOABS 1.5* 10/21/2011 0350   EOSABS 0.4 10/21/2011 0350   BASOSABS 0.2* 10/21/2011 0350    BMET    Component Value Date/Time   NA 135 10/22/2011 0430   K 4.2 10/22/2011 0430   CL 94* 10/22/2011 0430   CO2 26 10/22/2011 0430   GLUCOSE 155* 10/22/2011 0430   BUN 48* 10/22/2011 0430   CREATININE 3.69* 10/22/2011 0430   CALCIUM 8.9 10/22/2011 0430   GFRNONAA 12* 10/22/2011 0430   GFRAA 14* 10/22/2011 0430    Lab Results  Component Value Date   INR 1.21 10/22/2011   INR 1.00 10/21/2011   INR 1.00 10/12/2011   Medical Decision Making  [name] [name] is a 70 y.o. female who presents with ARF requiring hemodialysis.    Per Nephrology, the patient agrees to proceed with hemodialysis.  Patient will need to undergo further education in regards to permanent access options before she makes a decision in that regards.  Bilateral arm vein mapping  Will schedule patient for tunneled dialysis catheter placement tomorrow.  Hold Heparin drip at 6 AM and also hold Coumadin tonight.  The patient is aware the risks of tunneled dialysis  catheter placement include but are not limited to: bleeding, infection, central venous injury, pneumothorax, possible venous stenosis, possible malpositioning in the venous system, and possible infections related to long-term catheter presence.   The patient was aware of these risks and agreed to proceed.  Leonides Sake, MD [facility] [place] Office: [phone] Pager:  [phone]  10/22/2011, 4:43 PM

## 2011-10-23 ENCOUNTER — Inpatient Hospital Stay (HOSPITAL_COMMUNITY): Payer: Medicare Other

## 2011-10-23 ENCOUNTER — Encounter (HOSPITAL_COMMUNITY): Payer: Self-pay

## 2011-10-23 ENCOUNTER — Encounter (HOSPITAL_COMMUNITY)
Admission: AD | Disposition: A | Discharge status: Skilled Nursing Facility | Payer: Self-pay | Source: Other Acute Inpatient Hospital | Attending: Pulmonary Disease

## 2011-10-23 DIAGNOSIS — N179 Acute kidney failure, unspecified: Secondary | ICD-10-CM

## 2011-10-23 DIAGNOSIS — J810 Acute pulmonary edema: Secondary | ICD-10-CM

## 2011-10-23 DIAGNOSIS — J9600 Acute respiratory failure, unspecified whether with hypoxia or hypercapnia: Secondary | ICD-10-CM

## 2011-10-23 DIAGNOSIS — E119 Type 2 diabetes mellitus without complications: Secondary | ICD-10-CM

## 2011-10-23 LAB — PROTIME-INR
INR: 1.42 (ref 0.00–1.49)
Prothrombin Time: 17.6 s — ABNORMAL HIGH (ref 11.6–15.2)

## 2011-10-23 LAB — CBC
HCT: 22.9 % — ABNORMAL LOW (ref 36.0–46.0)
MCH: 26.5 pg (ref 26.0–34.0)
MCV: 82.1 fL (ref 78.0–100.0)
Platelets: 375 10*3/uL (ref 150–400)
RBC: 2.79 MIL/uL — ABNORMAL LOW (ref 3.87–5.11)
WBC: 24.7 10*3/uL — ABNORMAL HIGH (ref 4.0–10.5)

## 2011-10-23 LAB — GLUCOSE, CAPILLARY
Glucose-Capillary: 58 mg/dL — ABNORMAL LOW (ref 70–99)
Glucose-Capillary: 74 mg/dL (ref 70–99)

## 2011-10-23 LAB — URINE CULTURE
Colony Count: NO GROWTH
Culture  Setup Time: 201212191607
Culture: NO GROWTH

## 2011-10-23 LAB — C-REACTIVE PROTEIN: CRP: 8.32 mg/dL — ABNORMAL HIGH

## 2011-10-23 LAB — BASIC METABOLIC PANEL
CO2: 27 mEq/L (ref 19–32)
Calcium: 9.4 mg/dL (ref 8.4–10.5)
Chloride: 93 mEq/L — ABNORMAL LOW (ref 96–112)
Glucose, Bld: 56 mg/dL — ABNORMAL LOW (ref 70–99)
Sodium: 135 mEq/L (ref 135–145)

## 2011-10-23 LAB — SEDIMENTATION RATE: Sed Rate: 125 mm/hr — ABNORMAL HIGH (ref 0–22)

## 2011-10-23 SURGERY — INSERTION OF DIALYSIS CATHETER
Anesthesia: Choice

## 2011-10-23 SURGERY — INSERTION OF DIALYSIS CATHETER
Anesthesia: Monitor Anesthesia Care | Site: Chest | Laterality: Right | Wound class: Clean

## 2011-10-23 MED ORDER — SODIUM CHLORIDE 0.9 % IJ SOLN
INTRAMUSCULAR | Status: AC
  Administered 2011-10-23: 10 mL
  Filled 2011-10-23: qty 20

## 2011-10-23 MED ORDER — HYDROMORPHONE HCL PF 1 MG/ML IJ SOLN: 0.2500 mg | INTRAMUSCULAR | Status: DC | PRN

## 2011-10-23 MED ORDER — HEPARIN SODIUM (PORCINE) 1000 UNIT/ML IJ SOLN
INTRAMUSCULAR | Status: DC | PRN
  Administered 2011-10-23: 4.6 mL

## 2011-10-23 MED ORDER — HEPARIN SODIUM (PORCINE) 1000 UNIT/ML DIALYSIS
40.0000 [IU]/kg | Freq: Once | INTRAMUSCULAR | Status: DC
  Filled 2011-10-23: qty 3

## 2011-10-23 MED ORDER — ONDANSETRON HCL 4 MG/2ML IJ SOLN
INTRAMUSCULAR | Status: DC | PRN
  Administered 2011-10-23: 4 mg via INTRAVENOUS

## 2011-10-23 MED ORDER — SODIUM CHLORIDE 0.9 % IR SOLN
Status: DC | PRN
  Administered 2011-10-23: 1000 mL

## 2011-10-23 MED ORDER — FENTANYL CITRATE 0.05 MG/ML IJ SOLN
INTRAMUSCULAR | Status: DC | PRN
  Administered 2011-10-23: 50 ug via INTRAVENOUS

## 2011-10-23 MED ORDER — MEPERIDINE HCL 25 MG/ML IJ SOLN: 6.2500 mg | INTRAMUSCULAR | Status: DC | PRN

## 2011-10-23 MED ORDER — VANCOMYCIN 50 MG/ML ORAL SOLUTION
125.0000 mg | Freq: Four times a day (QID) | ORAL | Status: DC
  Administered 2011-10-24 – 2011-11-01 (×28): 125 mg via ORAL
  Filled 2011-10-23 (×40): qty 2.5

## 2011-10-23 MED ORDER — ONDANSETRON HCL 4 MG/2ML IJ SOLN: 4.0000 mg | Freq: Once | INTRAMUSCULAR | Status: DC | PRN

## 2011-10-23 MED ORDER — MIDAZOLAM HCL 5 MG/5ML IJ SOLN
INTRAMUSCULAR | Status: DC | PRN
  Administered 2011-10-23: 2 mg via INTRAVENOUS

## 2011-10-23 MED ORDER — LIDOCAINE-EPINEPHRINE (PF) 1 %-1:200000 IJ SOLN
INTRAMUSCULAR | Status: DC | PRN
  Administered 2011-10-23: 10 mL via INTRADERMAL

## 2011-10-23 MED ORDER — MORPHINE SULFATE 2 MG/ML IJ SOLN: 0.0500 mg/kg | INTRAMUSCULAR | Status: DC | PRN

## 2011-10-23 MED ORDER — SODIUM CHLORIDE 0.9 % IR SOLN
Status: DC | PRN
  Administered 2011-10-23: 16:00:00

## 2011-10-23 MED ORDER — SODIUM CHLORIDE 0.9 % IV SOLN
INTRAVENOUS | Status: DC | PRN
  Administered 2011-10-23: 13:00:00 via INTRAVENOUS

## 2011-10-23 MED ORDER — WARFARIN SODIUM 2.5 MG PO TABS
2.5000 mg | ORAL_TABLET | Freq: Once | ORAL | Status: AC
Start: 2011-10-23 — End: 2011-10-24
  Administered 2011-10-24: 2.5 mg via ORAL
  Filled 2011-10-23: qty 1

## 2011-10-23 SURGICAL SUPPLY — 39 items
BAG DECANTER FOR FLEXI CONT (MISCELLANEOUS) ×2 IMPLANT
CATH CANNON HEMO 15F 50CM (CATHETERS) IMPLANT
CATH CANNON HEMO 15FR 19 (HEMODIALYSIS SUPPLIES) IMPLANT
CATH CANNON HEMO 15FR 23CM (HEMODIALYSIS SUPPLIES) ×2 IMPLANT
CATH CANNON HEMO 15FR 31CM (HEMODIALYSIS SUPPLIES) IMPLANT
CATH CANNON HEMO 15FR 32CM (HEMODIALYSIS SUPPLIES) IMPLANT
CLOTH BEACON ORANGE TIMEOUT ST (SAFETY) ×2 IMPLANT
COVER PROBE W GEL 5X96 (DRAPES) ×2 IMPLANT
DERMABOND ADVANCED (GAUZE/BANDAGES/DRESSINGS) ×1
DERMABOND ADVANCED .7 DNX12 (GAUZE/BANDAGES/DRESSINGS) ×1 IMPLANT
DRAPE C-ARM 42X72 X-RAY (DRAPES) ×2 IMPLANT
DRAPE CHEST BREAST 15X10 FENES (DRAPES) ×2 IMPLANT
GAUZE SPONGE 2X2 8PLY STRL LF (GAUZE/BANDAGES/DRESSINGS) ×1 IMPLANT
GAUZE SPONGE 4X4 16PLY XRAY LF (GAUZE/BANDAGES/DRESSINGS) ×2 IMPLANT
GLOVE BIOGEL PI IND STRL 7.5 (GLOVE) ×1 IMPLANT
GLOVE BIOGEL PI INDICATOR 7.5 (GLOVE) ×1
GLOVE SURG SS PI 7.5 STRL IVOR (GLOVE) ×2 IMPLANT
GOWN PREVENTION PLUS XXLARGE (GOWN DISPOSABLE) ×2 IMPLANT
GOWN STRL NON-REIN LRG LVL3 (GOWN DISPOSABLE) ×2 IMPLANT
KIT BASIN OR (CUSTOM PROCEDURE TRAY) ×2 IMPLANT
KIT ROOM TURNOVER OR (KITS) ×2 IMPLANT
NEEDLE 18GX1X1/2 (RX/OR ONLY) (NEEDLE) ×2 IMPLANT
NEEDLE HYPO 25GX1X1/2 BEV (NEEDLE) ×2 IMPLANT
NS IRRIG 1000ML POUR BTL (IV SOLUTION) ×2 IMPLANT
PACK SURGICAL SETUP 50X90 (CUSTOM PROCEDURE TRAY) ×2 IMPLANT
PAD ARMBOARD 7.5X6 YLW CONV (MISCELLANEOUS) ×4 IMPLANT
SOAP 2 % CHG 4 OZ (WOUND CARE) ×2 IMPLANT
SPONGE GAUZE 2X2 STER 10/PKG (GAUZE/BANDAGES/DRESSINGS) ×1
SUT ETHILON 3 0 PS 1 (SUTURE) ×2 IMPLANT
SUT VICRYL 4-0 PS2 18IN ABS (SUTURE) ×2 IMPLANT
SYR 20CC LL (SYRINGE) ×4 IMPLANT
SYR 30ML LL (SYRINGE) IMPLANT
SYR 5ML LL (SYRINGE) ×4 IMPLANT
SYR CONTROL 10ML LL (SYRINGE) ×2 IMPLANT
SYRINGE 10CC LL (SYRINGE) ×2 IMPLANT
TAPE CLOTH SURG 4X10 WHT LF (GAUZE/BANDAGES/DRESSINGS) ×2 IMPLANT
TOWEL OR 17X24 6PK STRL BLUE (TOWEL DISPOSABLE) ×4 IMPLANT
TOWEL OR 17X26 10 PK STRL BLUE (TOWEL DISPOSABLE) ×2 IMPLANT
WATER STERILE IRR 1000ML POUR (IV SOLUTION) ×2 IMPLANT

## 2011-10-23 NOTE — Anesthesia Postprocedure Evaluation (Signed)
  Anesthesia Post-op Note  Patient: [name] [name]  Procedure(s) Performed:  INSERTION OF DIALYSIS CATHETER - Right internal jugular Insertion diatek catheter  Patient Location: PACU  Anesthesia Type: MAC  Level of Consciousness: awake  Airway and Oxygen Therapy: Patient Spontanous Breathing  Post-op Pain: mild  Post-op Assessment: Post-op Vital signs reviewed  Post-op Vital Signs: stable  Complications: No apparent anesthesia complications

## 2011-10-23 NOTE — Progress Notes (Signed)
VASCULAR PROGRESS NOTE  SUBJECTIVE: Asked to place tunneled dialysis catheter today. Pt ate breakfast at 9:30 AM.  PHYSICAL EXAM: Filed Vitals:   10/23/11 0705 10/23/11 0807 10/23/11 0926 10/23/11 1150  BP:   133/60 158/63  Pulse:   99   Temp:   97.8 F (36.6 C)   TempSrc:   Oral   Resp:   16   Height:      Weight:      SpO2: 100% 97% 100%    Lungs: clear to auscultation Temporary catheter in R IJ  LABS: Lab Results  Component Value Date   WBC 24.7* 10/23/2011   HGB 7.4* 10/23/2011   HCT 22.9* 10/23/2011   MCV 82.1 10/23/2011   PLT 375 10/23/2011   Lab Results  Component Value Date   CREATININE 4.44* 10/23/2011   Lab Results  Component Value Date   INR 1.42 10/23/2011   CBG (last 3)   Basename 10/23/11 1306 10/23/11 0921 10/23/11 0625  GLUCAP 103* 88 74     ASSESSMENT/PLAN: 1. Plan placement of tunneled dialysis catheter at 3:30 PM. Discussed with patient. 2. Dr Imogene Burn following for future permanent access.  Waverly Ferrari, MD, FACS Beeper: [phone] 10/23/2011

## 2011-10-23 NOTE — Progress Notes (Signed)
Name: [name] [name] MRN: [id number] DOB: Nov 23, 1940    LOS: 12  PCCM PROGRESS NOTE  History of Present Illness: 70 yo WM with CKD secondary to atrophic kidney transferred from [facility] ED on 12/08, where she presented with progressive weakness. She was treated with diuretics since admission but despite that developed increased oxygen requirements requiring NIMV and ICU transfer. PCCM was consulted to assist in management.   Lines / Drains: 12/08 L IJ TLC>>>  12/08 Foley>>>  12/14 R IJ dialysis catheter>>>  Cultures: 12/09 UC>>>E.Coli resistant to Ciprofloxacin (ESBL) 12/19 BC>>> 12/19 UC>>>   Antibiotics: 12/08 Ciprofloxacin (UTI)>>>12/11  12/11 Imipenem>>>12/20 12/20  Vancomycin PO>>>  Tests / Events: 12/19  CXR>>>bibasilar densities (? Effusions / atelectasis / infiltrate) 12/10  Abdomen CT>>> colitis, cholelithiasis without cholecystitis 12/10  Chest CT>>>mild interstitial edema, moderate L pleural effusion, LLL opacity (likely atelectasis), minimal patchy opacities bilateral upper lobes  Overnight:  Able to titrate oxygen down to 50% VTM.  Chest pain yesterday with HD, now resolved.  Diarrhea started overnight per RN.  Vital Signs: Temp:  [97.7 F (36.5 C)-98.3 F (36.8 C)] 97.8 F (36.6 C) (12/20 0926) Pulse Rate:  [78-99] 99  (12/20 0926) Resp:  [15-20] 16  (12/20 0926) BP: (99-171)/(56-66) 133/60 mmHg (12/20 0926) SpO2:  [90 %-100 %] 100 % (12/20 0926) FiO2 (%):  [50 %-100 %] 100 % (12/20 0807) Weight:  [59.8 kg (131 lb 13.4 oz)] 131 lb 13.4 oz (59.8 kg) (12/20 0500) I/O last 3 completed shifts: In: 1515 [P.O.:790; I.V.:623; IV Piggyback:102] Out: 1505 [Urine:1505]  Physical Examination: General:  Comfortable, watching TV Neuro:  Nonfocal HEENT:  PERRL Neck:  No JVD, supple Cardiovascular:  RRR, no murmurs Lungs:  Bilateral diminished air entry, few rales Abdomen:  Soft, nontender, bowel sounds diminished Musculoskeletal:  No significant pedal edema Skin:  No  rash  Ventilator settings: Vent Mode:  [-]  FiO2 (%):  [50 %-100 %] 100 %  Labs and Imaging:  Reviewed.  Please refer to the Assessment and Plan section for relevant results.  Assessment and Plan:  Sub-acute hypoxemic respiratory failure.  Multifactorial secondary to pulmonary edema/fluid overload, presumed PE (on Heparin), pneumonia (treated). Thr degree of hypoxemia appears to be out of proportion with parenchymal changes. -->supplemental oxygen, titrate to SpO2 >88% -->TTE with bubble study to r/o shunt  Acute DVT R LE -->continue Heparin, stop after INR therapeutic -->restart Coumadin after surgical placement of HD access  CKD secondary to atrophic kideney, DM, HTN.  No evidence of hyperkalemia.  Lab 10/23/11 0430 10/22/11 0430 10/21/11 0350 10/20/11 0432 10/19/11 0359  CREATININE 4.44* 3.69* 4.90* 4.19* 2.92*    Lab 10/23/11 0430 10/22/11 0430 10/21/11 0350 10/20/11 0432 10/19/11 0359  K 3.6 4.2 3.5 3.7 3.7   -->HD per renal -->awaiting placement of permanent HD access -->Lasix  UTI with ESBL E. Coli resistant to Ciprofloxacin, completed ABx course. -->d/c Primaxin  Suspected C. Dif as rising WBC with evidence of colitis on abd CT.  Edmon Crape C.DIf PCR pending.  ID involved.  Lab 10/23/11 0430 10/22/11 0430 10/21/11 0350 10/20/11 0439 10/19/11 0359  WBC 24.7* 34.2* 19.3* 17.0* 16.9*   -->isolation -->follow blood / urine Cx -->trend WBC -->emprical Vancomycin PO  Diabetes mellitus, fair control.  Hyperglycemia as was made NPO for procedure but Lantus was continued.  Lab 10/23/11 0625 10/23/11 0554 10/22/11 2230 10/22/11 1635 10/22/11 1258  GLUCAP 74 58* 96 122* 155*   -->BCBGM+SSI  -->Lantus  Hypertension / CAD. No active ischemia / arrhythmia.  -->  Continue ASA, Plavix, Amlodipine, Clonidine, Hydralazine, Imdur, Metoprolol   Anemia, multifactorial.   Lab 10/23/11 0430 10/22/11 0430 10/21/11 0350 10/20/11 0439 10/19/11 0359  HCT 22.9* 24.6* 23.2* 24.0* 26.6*    -->Trend Hct  -->EPO per Renal  Hypothyroidism  --> Levothyroxin   Best practices / Disposition  -->SDU status under PCCM -->Renal, ID consulting -->LCB no intubation, no CPR no cardioversion -->DVT Px is not indicated (Heparin gtt) -->GI Px is not indicated -->diet  -->d/c HD cath after placement of tunneled HD catheter -->d/c TLC after placing PICC for IV access  Orlean Bradford, M.D. Pulmonary and Critical Care Medicine [facility] Cell: [phone] Pager: [phone]  10/23/2011, 11:03 AM

## 2011-10-23 NOTE — Progress Notes (Signed)
Clinical Social Worker received a phone call from [facility] in [place], [place] requesting additional progress notes on pt.  CSW submitted requested paperwork.  CSW to continue to follow and assist as needed.  Angelia Mould, MSW, [place] [phone]

## 2011-10-23 NOTE — Op Note (Signed)
10/23/2011  PREOP DIAGNOSIS: Chronic kidney disease  POSTOP DIAGNOSIS: Chronic kidney disease  PROCEDURE:  1. Removal of temporary right IJ dialysis catheter 2. Ultrasound guided placement of Right IJ dietetic catheter (23 cm)  SURGEON: Di Kindle. Edilia Bo, MD, FACS  ASSIST: none  ANESTHESIA: local with sedation   EBL: minimal  FINDINGS: patent right IJ  INDICATIONS: this is a pleasant 70 year old woman who was evaluated by Dr. Imogene Burn for hemodialysis access. I was asked to place a tunneled dialysis catheter today. The procedure and potential complications were discussed with the patient.  TECHNIQUE: The patient was taken to the operating room and sedated by anesthesia. The temporary catheter in the right IJ was removed and pressure held for hemostasis. I elected not to change his catheter over a wire at the cannulation site was fairly high in the neck. The neck and upper chest were then prepped and draped in the usual sterile fashion. After the skin was anesthetized with 1% lidocaine, and under ultrasound guidance, the right IJ was cannulated and a guidewire introduced into the superior vena cava under fluoroscopic control. The tract over the wire was dilated and then the dilator and peel-away sheath were passed over the wire and the wire and dilator removed. The catheter was passed through the peel-away sheath and positioned in the right atrium. The exit site for the catheter was selected and the skin anesthetized between the 2 areas. The catheter was then brought through the tunnel, cut to the appropriate length, and the distal ports were attached. Both ports withdrew easily, were then flushed with heparinized saline and filled with concentrated heparin. The catheter was secured at its exit site with a 3-0 nylon suture. The IJ cannulation site was closed with a 4-0 subcuticular stitch. A sterile dressing was applied. The patient tolerated the procedure well and was transferred to the  recovery room in stable condition. All needle and sponge counts were correct.  Waverly Ferrari, MD, FACS [facility] [place]  DATE OF OPERATION: 10/23/2011 DATE OF DICTATION: 10/23/2011

## 2011-10-23 NOTE — Anesthesia Preprocedure Evaluation (Addendum)
Anesthesia Evaluation  Patient identified by MRN, date of birth, ID band Patient awake    Reviewed: Allergy & Precautions, H&P , NPO status , reviewed documented beta blocker date and time   Airway Mallampati: II TM Distance: <3 FB Neck ROM: full    Dental  (+) Teeth Intact and Dental Advisory Given   Pulmonary pneumonia , COPD COPD inhaler and oxygen dependent,    Pulmonary exam normal       Cardiovascular Exercise Tolerance: Poor METS: hypertension, Pt. on medications + angina + CAD and + Past MI Regular Normal    Neuro/Psych    GI/Hepatic GERD-  ,  Endo/Other  Diabetes mellitus-, Poorly Controlled, Type 1, Insulin DependentHypothyroidism   Renal/GU      Musculoskeletal   Abdominal   Peds  Hematology   Anesthesia Other Findings   Reproductive/Obstetrics                         Anesthesia Physical Anesthesia Plan  ASA: IV  Anesthesia Plan: MAC   Post-op Pain Management:    Induction: Intravenous  Airway Management Planned: Simple Face Mask  Additional Equipment:   Intra-op Plan:   Post-operative Plan:   Informed Consent: I have reviewed the patients History and Physical, chart, labs and discussed the procedure including the risks, benefits and alternatives for the proposed anesthesia with the patient or authorized representative who has indicated his/her understanding and acceptance.   Dental advisory given  Plan Discussed with: CRNA, Anesthesiologist and Surgeon  Anesthesia Plan Comments:         Anesthesia Quick Evaluation

## 2011-10-23 NOTE — H&P (View-Only) (Signed)
VASCULAR PROGRESS NOTE  SUBJECTIVE: Asked to place tunneled dialysis catheter today. Pt ate breakfast at 9:30 AM.  PHYSICAL EXAM: Filed Vitals:   10/23/11 0705 10/23/11 0807 10/23/11 0926 10/23/11 1150  BP:   133/60 158/63  Pulse:   99   Temp:   97.8 F (36.6 C)   TempSrc:   Oral   Resp:   16   Height:      Weight:      SpO2: 100% 97% 100%    Lungs: clear to auscultation Temporary catheter in R IJ  LABS: Lab Results  Component Value Date   WBC 24.7* 10/23/2011   HGB 7.4* 10/23/2011   HCT 22.9* 10/23/2011   MCV 82.1 10/23/2011   PLT 375 10/23/2011   Lab Results  Component Value Date   CREATININE 4.44* 10/23/2011   Lab Results  Component Value Date   INR 1.42 10/23/2011   CBG (last 3)   Basename 10/23/11 1306 10/23/11 0921 10/23/11 0625  GLUCAP 103* 88 74     ASSESSMENT/PLAN: 1. Plan placement of tunneled dialysis catheter at 3:30 PM. Discussed with patient. 2. Dr Chen following for future permanent access.  Enrico Eaddy, MD, FACS Beeper: [phone] 10/23/2011    

## 2011-10-23 NOTE — Interval H&P Note (Signed)
History and Physical Interval Note:  10/23/2011 2:57 PM  [name] [name]  has presented today for surgery, with the diagnosis of ESRD  The various methods of treatment have been discussed with the patient and family. After consideration of risks, benefits and other options for treatment, the patient has consented to: INSERTION OF DIALYSIS CATHETER.  The patients' history has been reviewed, patient examined, no change in status, stable for surgery.  I have reviewed the patients' chart and labs.  Questions were answered to the patient's satisfaction.     Larisha Vencill S

## 2011-10-23 NOTE — Transfer of Care (Signed)
Immediate Anesthesia Transfer of Care Note  Patient: [name] [name]  Procedure(s) Performed:  INSERTION OF DIALYSIS CATHETER - Right internal jugular Insertion diatek catheter  Patient Location: PACU  Anesthesia Type: MAC  Level of Consciousness: awake, alert  and oriented  Airway & Oxygen Therapy: Patient Spontanous Breathing and Patient connected to face mask oxygen  Post-op Assessment: Report given to PACU RN, Post -op Vital signs reviewed and stable and Patient moving all extremities  Post vital signs: Reviewed and stable  Complications: No apparent anesthesia complications

## 2011-10-23 NOTE — Progress Notes (Signed)
CXR results rec'd, no evidence of pneumothorax

## 2011-10-23 NOTE — Progress Notes (Signed)
ANTICOAGULATION CONSULT NOTE - Follow Up Consult  Pharmacy Consult for Heparin Indication: DVT, r/o PE  Allergies  Allergen Reactions  . Novocain Other (See Comments)    Unknown    Patient Measurements: Height: 5\' 5"  (165.1 cm) Weight: 131 lb 13.4 oz (59.8 kg) IBW/kg (Calculated) : 57  Adjusted Body Weight:   Vital Signs: Temp: 97.8 F (36.6 C) (12/20 0926) Temp src: Oral (12/20 0926) BP: 133/60 mmHg (12/20 0926) Pulse Rate: 99  (12/20 0926)  Labs:  Basename 10/23/11 0430 10/22/11 1330 10/22/11 0855 10/22/11 0430 10/21/11 0350  HGB 7.4* -- -- 7.9* --  HCT 22.9* -- -- 24.6* 23.2*  PLT 375 -- -- 394 373  APTT -- -- -- -- --  LABPROT 17.6* -- -- 15.6* 13.4  INR 1.42 -- -- 1.21 1.00  HEPARINUNFRC 0.52 0.61 0.71* -- --  CREATININE 4.44* -- -- 3.69* 4.90*  CKTOTAL -- -- -- -- --  CKMB -- -- -- -- --  TROPONINI -- -- -- -- --   Estimated Creatinine Clearance: 10.9 ml/min (by C-G formula based on Cr of 4.44).   Medications:  Heparin 1150 units/hr   Assessment: 70yof on Heparin for acute DVT, r/o PE. Heparin level (0.52) is therapeutic. Coumadin was placed on hold yesterday (12/19) for HD cath placement.  - H/H and Plts slight drop - No bleeding complications reported  Goal of Therapy:  Heparin level 0.3-0.7 units/ml   Plan:  1. Continue heparin 1150 units/hr (11.5 ml/hr) 2. Follow-up AM heparin level, HD cath placement, and restart of Coumadin  Cleon Dew [phone] 10/23/2011,11:19 AM

## 2011-10-23 NOTE — Progress Notes (Signed)
Subjective: Interval History: none.  Objective: Vital signs in last 24 hours: Temp:  [97.7 F (36.5 C)-98.3 F (36.8 C)] 97.8 F (36.6 C) (12/20 0926) Pulse Rate:  [78-99] 99  (12/20 0926) Resp:  [15-20] 16  (12/20 0926) BP: (99-171)/(56-66) 133/60 mmHg (12/20 0926) SpO2:  [90 %-100 %] 100 % (12/20 0926) FiO2 (%):  [50 %-100 %] 100 % (12/20 0807) Weight:  [59.8 kg (131 lb 13.4 oz)] 131 lb 13.4 oz (59.8 kg) (12/20 0500) Weight change: -10.1 kg (-22 lb 4.3 oz)  Intake/Output from previous day: 12/19 0701 - 12/20 0700 In: 1135 [P.O.:790; I.V.:345] Out: 900 [Urine:900] Intake/Output this shift: Total I/O In: -  Out: 250 [Urine:250]  General appearance: alert, cooperative, appears stated age and pale Neck: R IJ cath Resp: diminished breath sounds bilaterally and rales bibasilar Cardio: S1, S2 normal and systolic murmur: systolic ejection 2/6, decrescendo at 2nd left intercostal space GI: obese, liver down  Lab Results:  Basename 10/23/11 0430 10/22/11 0430  WBC 24.7* 34.2*  HGB 7.4* 7.9*  HCT 22.9* 24.6*  PLT 375 394   BMET:  Basename 10/23/11 0430 10/22/11 0430  NA 135 135  K 3.6 4.2  CL 93* 94*  CO2 27 26  GLUCOSE 56* 155*  BUN 68* 48*  CREATININE 4.44* 3.69*  CALCIUM 9.4 8.9    Basename 10/21/11 1230  PTH 367.5*   Iron Studies:  Basename 10/21/11 0350  IRON 37*  TIBC 189*  TRANSFERRIN --  FERRITIN --    Studies/Results: Ct Abdomen Pelvis Wo Contrast  10/22/2011  *RADIOLOGY REPORT*  Clinical Data:  Leukocytosis, acute on chronic renal failure, respiratory failure, recent pneumonia  CT CHEST, ABDOMEN AND PELVIS WITHOUT CONTRAST  Technique:  Multidetector CT imaging of the chest, abdomen and pelvis was performed following the standard protocol without IV contrast.  Comparison:  [place] CT chest dated 09/22/2011, [facility] CT chest/abdomen/pelvis dated 10/19/2007  CT CHEST  Findings:  Moderate left pleural effusion.  Associated left lower lobe opacity,  likely atelectasis, underlying pneumonia not excluded.  Minimal patchy opacities in the left upper lobe and posterior right upper lobe (series 3/image 24), possibly infectious.  Mild interstitial edema.  Underlying centrilobular emphysematous changes.  No pneumothorax.  Visualized thyroid is unremarkable.  The heart is top normal in size.  No pericardial effusion. Coronary atherosclerosis.  There is no calcification of the aortic arch.  No suspicious mediastinal or axillary lymphadenopathy.  Visualized osseous structures are within normal limits.  IMPRESSION: Mild interstitial edema with a moderate left pleural effusion.  Associated left lower lobe opacity, likely atelectasis, underlying pneumonia not excluded.  Minimal patchy opacities in the left upper lobe and posterior right upper lobe, possibly infectious.  CT ABDOMEN AND PELVIS  Findings:  Small hiatal hernia.  Unenhanced liver, spleen, pancreas, and adrenal glands are within normal limits.  Layering small gallstones.  No associated inflammatory changes.  Severe right renal atrophy, chronic.  Left kidney is unremarkable. No hydronephrosis.  No evidence of bowel obstruction.  Colonic diverticulosis, without associated inflammatory changes.  Pericolonic stranding involving the distal sigmoid colon (series 2/image 110), worrisome for colitis.  Extensive atherosclerotic calcifications of the abdominal aorta and branch vessels.  No abdominopelvic ascites.  No suspicious abdominopelvic lymphadenopathy.  Uterus and bilateral ovaries are unremarkable.  Bladder is decompressed by indwelling Foley catheter.  Mild degenerative changes of the lumbar spine.  IMPRESSION: Pericolonic stranding involving the distal sigmoid colon, worrisome for colitis.  Cholelithiasis, without associated inflammatory changes.  Severe right renal atrophy,  chronic.  Original Report Authenticated By: Charline Bills, M.D.   Ct Chest Wo Contrast  10/22/2011  *RADIOLOGY REPORT*  Clinical  Data:  Leukocytosis, acute on chronic renal failure, respiratory failure, recent pneumonia  CT CHEST, ABDOMEN AND PELVIS WITHOUT CONTRAST  Technique:  Multidetector CT imaging of the chest, abdomen and pelvis was performed following the standard protocol without IV contrast.  Comparison:  [place] CT chest dated 09/22/2011, [facility] CT chest/abdomen/pelvis dated 10/19/2007  CT CHEST  Findings:  Moderate left pleural effusion.  Associated left lower lobe opacity, likely atelectasis, underlying pneumonia not excluded.  Minimal patchy opacities in the left upper lobe and posterior right upper lobe (series 3/image 24), possibly infectious.  Mild interstitial edema.  Underlying centrilobular emphysematous changes.  No pneumothorax.  Visualized thyroid is unremarkable.  The heart is top normal in size.  No pericardial effusion. Coronary atherosclerosis.  There is no calcification of the aortic arch.  No suspicious mediastinal or axillary lymphadenopathy.  Visualized osseous structures are within normal limits.  IMPRESSION: Mild interstitial edema with a moderate left pleural effusion.  Associated left lower lobe opacity, likely atelectasis, underlying pneumonia not excluded.  Minimal patchy opacities in the left upper lobe and posterior right upper lobe, possibly infectious.  CT ABDOMEN AND PELVIS  Findings:  Small hiatal hernia.  Unenhanced liver, spleen, pancreas, and adrenal glands are within normal limits.  Layering small gallstones.  No associated inflammatory changes.  Severe right renal atrophy, chronic.  Left kidney is unremarkable. No hydronephrosis.  No evidence of bowel obstruction.  Colonic diverticulosis, without associated inflammatory changes.  Pericolonic stranding involving the distal sigmoid colon (series 2/image 110), worrisome for colitis.  Extensive atherosclerotic calcifications of the abdominal aorta and branch vessels.  No abdominopelvic ascites.  No suspicious abdominopelvic lymphadenopathy.   Uterus and bilateral ovaries are unremarkable.  Bladder is decompressed by indwelling Foley catheter.  Mild degenerative changes of the lumbar spine.  IMPRESSION: Pericolonic stranding involving the distal sigmoid colon, worrisome for colitis.  Cholelithiasis, without associated inflammatory changes.  Severe right renal atrophy, chronic.  Original Report Authenticated By: Charline Bills, M.D.   Dg Chest Port 1 View  10/22/2011  *RADIOLOGY REPORT*  Clinical Data: Pneumonia, follow-up  PORTABLE CHEST - 1 VIEW  Comparison: Portable chest x-ray of 10/19/2011  Findings: Opacity remains at the left lung base most consistent with effusion and atelectasis.  Pneumonia cannot be excluded.  The right lung is clear.  Bilateral IJ central venous lines are unchanged in position.  The heart is borderline enlarged.  IMPRESSION: No change in opacity primarily at the left lung base consistent with effusion, atelectasis, and possibly pneumonia.  Original Report Authenticated By: Juline Patch, M.D.    I have reviewed the patient's current medications.  Assessment/Plan: 1 CKD/?AKI for PC and needs perm access, agrees as did yest.  For HD after 2 DM controlled 3 COPD stable 4 PVD 5 Anemia on EPO and Fe 6 HPTH  P PC, Epo, HD, perm access    LOS: 12 days   Murray Guzzetta L 10/23/2011,11:02 AM

## 2011-10-23 NOTE — Progress Notes (Signed)
Physical Therapy Treatment Patient Details Name: [name] [name] MRN: [id number] DOB: [birth year] Today's Date: 10/23/2011  PT Assessment/Plan  PT - Assessment/Plan Comments on Treatment Session: Pt back on non-rebreather this am with O2 sats staying at 93% and above with treatment.  Also noted low Hbg levels this am, so OOB deferred.  Pt tolerated bilateral LE ther ex well. PT Plan: Discharge plan remains appropriate;Frequency needs to be updated PT Frequency: Min 2X/week Follow Up Recommendations: Skilled nursing facility Equipment Recommended: Defer to next venue PT Goals  Acute Rehab PT Goals PT Goal Formulation: With patient Time For Goal Achievement: 2 weeks PT Goal: Supine/Side to Sit - Progress: Progressing toward goal PT Goal: Sit to Supine/Side - Progress: Progressing toward goal  PT Treatment Precautions/Restrictions  Precautions Precautions: Fall Required Braces or Orthoses: No Restrictions Weight Bearing Restrictions: No Other Position/Activity Restrictions: Desats quickly with activity.  Pt on non-rebreather mask with FiO2 100%. Pain 0/10 with treatment. Mobility (including Balance) Bed Mobility Bed Mobility: Yes Supine to Sit: 2: Max assist;HOB flat (For partial supine to sit.  Pt declined full sitting EOB.) Supine to Sit Details (indicate cue type and reason): Assist at trunk to translate anterior over BOS in long sitting.  Cues for sequence. Sitting - Scoot to Edge of Bed: 2: Max assist (For partial sit to supine.  Pt declined full sitting EOB.) Sitting - Scoot to Edge of Bed Details (indicate cue type and reason): Assist at trunk to slowly lower to bed.  Cues for sequence. Scooting to [facility]: 2: Max assist Scooting to [facility] Details (indicate cue type and reason): Pt utilized bilateral LEs to push through bed lifting bottom into bridging to scoot toward HOB.  Assist to facilitate at bilateral hips for bridging and using pad to scoot to [facility].  Cues for  sequence. Transfers Transfers: No Sit to Stand: Not tested (comment) Stand to Sit: Not tested (comment) Stand Pivot Transfers: Not tested (comment) Ambulation/Gait Ambulation/Gait: No Stairs: No Wheelchair Mobility Wheelchair Mobility: No  Posture/Postural Control Posture/Postural Control: No significant limitations Balance Balance Assessed: No Exercise  General Exercises - Lower Extremity Ankle Circles/Pumps: AROM;Both;10 reps;Supine Quad Sets: AROM;Both;10 reps;Supine Heel Slides: AAROM;Both;10 reps;Supine Hip ABduction/ADduction: AAROM;Both;10 reps;Supine Straight Leg Raises: AAROM;Both;10 reps;Supine End of Session PT - End of Session Activity Tolerance: Patient limited by fatigue Patient left: in bed;with call bell in reach Nurse Communication: Other (comment) (Fatigue level this am.) General Behavior During Session: [facility] for tasks performed Cognition: [facility] for tasks performed  Cephus Shelling 10/23/2011, 9:22 AM  10/23/2011 Cephus Shelling, PT, DPT [phone]

## 2011-10-24 ENCOUNTER — Encounter (HOSPITAL_COMMUNITY): Payer: Self-pay | Admitting: Vascular Surgery

## 2011-10-24 ENCOUNTER — Inpatient Hospital Stay (HOSPITAL_COMMUNITY): Payer: Medicare Other

## 2011-10-24 DIAGNOSIS — Q211 Atrial septal defect: Secondary | ICD-10-CM

## 2011-10-24 DIAGNOSIS — D72829 Elevated white blood cell count, unspecified: Secondary | ICD-10-CM

## 2011-10-24 LAB — GLUCOSE, CAPILLARY

## 2011-10-24 LAB — CBC
HCT: 24.6 % — ABNORMAL LOW (ref 36.0–46.0)
MCH: 25.9 pg — ABNORMAL LOW (ref 26.0–34.0)
MCHC: 31.3 g/dL (ref 30.0–36.0)
MCV: 82.8 fL (ref 78.0–100.0)
RDW: 17.7 % — ABNORMAL HIGH (ref 11.5–15.5)

## 2011-10-24 LAB — RENAL FUNCTION PANEL
BUN: 29 mg/dL — ABNORMAL HIGH (ref 6–23)
CO2: 28 mEq/L (ref 19–32)
Chloride: 97 mEq/L (ref 96–112)
Creatinine, Ser: 2.54 mg/dL — ABNORMAL HIGH (ref 0.50–1.10)
GFR calc non Af Amer: 18 mL/min — ABNORMAL LOW (ref >90)

## 2011-10-24 MED ORDER — WARFARIN SODIUM 2.5 MG PO TABS
2.5000 mg | ORAL_TABLET | Freq: Once | ORAL | Status: AC
  Administered 2011-10-24: 2.5 mg via ORAL
  Filled 2011-10-24: qty 1

## 2011-10-24 MED ORDER — FAMOTIDINE 20 MG PO TABS
20.0000 mg | ORAL_TABLET | Freq: Every day | ORAL | Status: DC
  Administered 2011-10-25 – 2011-11-01 (×8): 20 mg via ORAL
  Filled 2011-10-24 (×9): qty 1

## 2011-10-24 MED ORDER — SODIUM CHLORIDE 0.9 % IJ SOLN
125.0000 mg | INTRAMUSCULAR | Status: DC
  Administered 2011-10-24 – 2011-10-27 (×2): 125 mg via INTRAVENOUS
  Filled 2011-10-24 (×3): qty 10

## 2011-10-24 MED ORDER — CALCIUM CARBONATE 1250 MG/5ML PO SUSP
500.0000 mg | Freq: Four times a day (QID) | ORAL | Status: DC | PRN
  Administered 2011-10-30: 500 mg via ORAL
  Filled 2011-10-24 (×2): qty 5

## 2011-10-24 MED ORDER — ZOLPIDEM TARTRATE 5 MG PO TABS: 5.0000 mg | ORAL_TABLET | Freq: Every evening | ORAL | Status: DC | PRN

## 2011-10-24 MED ORDER — ONDANSETRON HCL 4 MG/2ML IJ SOLN: 4.0000 mg | Freq: Four times a day (QID) | INTRAMUSCULAR | Status: DC | PRN

## 2011-10-24 MED ORDER — ALUM & MAG HYDROXIDE-SIMETH 200-200-20 MG/5ML PO SUSP
30.0000 mL | ORAL | Status: AC | PRN
  Administered 2011-10-24: 30 mL via ORAL
  Filled 2011-10-24: qty 30

## 2011-10-24 MED ORDER — ONDANSETRON HCL 4 MG PO TABS
4.0000 mg | ORAL_TABLET | Freq: Four times a day (QID) | ORAL | Status: DC | PRN
  Administered 2011-10-28 – 2011-10-30 (×2): 4 mg via ORAL
  Filled 2011-10-24: qty 1

## 2011-10-24 MED ORDER — HEPARIN SODIUM (PORCINE) 1000 UNIT/ML DIALYSIS: 100.0000 [IU]/kg | INTRAMUSCULAR | Status: DC | PRN

## 2011-10-24 MED ORDER — DOCUSATE SODIUM 283 MG RE ENEM
1.0000 | ENEMA | RECTAL | Status: DC | PRN
  Filled 2011-10-24: qty 1

## 2011-10-24 MED ORDER — SODIUM CHLORIDE 0.9 % IJ SOLN
INTRAMUSCULAR | Status: AC
  Administered 2011-10-24: 10 mL
  Filled 2011-10-24: qty 20

## 2011-10-24 NOTE — Progress Notes (Signed)
Subjective: No new complaints  Objective: Weight change: 15 lb 3.4 oz (6.9 kg)  Intake/Output Summary (Last 24 hours) at 10/24/11 1010 Last data filed at 10/24/11 0800  Gross per 24 hour  Intake    390 ml  Output   1914 ml  Net  -1524 ml   Blood pressure 161/59, pulse 97, temperature 98.2 F (36.8 C), temperature source Axillary, resp. rate 16, height 5\' 5"  (1.651 m), weight 146 lb 9.7 oz (66.5 kg), SpO2 98.00%. Temp:  [97 F (36.1 C)-99 F (37.2 C)] 98.2 F (36.8 C) (12/21 0800) Pulse Rate:  [95-116] 97  (12/21 0800) Resp:  [14-20] 16  (12/21 0800) BP: (79-161)/(37-79) 161/59 mmHg (12/21 0800) SpO2:  [95 %-100 %] 98 % (12/21 0832) FiO2 (%):  [8 %] 8 % (12/20 1757) Weight:  [144 lb 2.9 oz (65.4 kg)-147 lb 0.8 oz (66.7 kg)] 146 lb 9.7 oz (66.5 kg) (12/21 0500)  Physical Exam: General: Alert and awake, oriented x3, not in any acute distress. On face mask gettting TTE Abdomen: soft slightly tender, slightly  distended, normal bowel sounds,  Extremities: no clubbing or edema noted bilaterally  Skin: no rashes  Neuro: nonfocal, strength and sensation intact   Lab Results:  Basename 10/24/11 0439 10/23/11 0430  WBC 21.5* 24.7*  HGB 7.7* 7.4*  HCT 24.6* 22.9*  PLT 382 375   BMET  Basename 10/24/11 0430 10/23/11 0430  NA 138 135  K 3.8 3.6  CL 97 93*  CO2 28 27  GLUCOSE 137* 56*  BUN 29* 68*  CREATININE 2.54* 4.44*  CALCIUM 9.0 9.4    Micro Results: Recent Results (from the past 240 hour(s))  CULTURE, BLOOD (ROUTINE X 2)     Status: Normal (Preliminary result)   Collection Time   10/22/11 12:45 PM      Component Value Range Status Comment   Specimen Description BLOOD RIGHT HAND   Final    Special Requests BOTTLES DRAWN AEROBIC AND ANAEROBIC 10CC   Final    Setup Time [id number]   Final    Culture     Final    Value:        BLOOD CULTURE RECEIVED NO GROWTH TO DATE CULTURE WILL BE HELD FOR 5 DAYS BEFORE ISSUING A FINAL NEGATIVE REPORT   Report Status PENDING    Incomplete   CULTURE, BLOOD (ROUTINE X 2)     Status: Normal (Preliminary result)   Collection Time   10/22/11 12:55 PM      Component Value Range Status Comment   Specimen Description BLOOD RIGHT WRIST   Final    Special Requests BOTTLES DRAWN AEROBIC ONLY 5CC   Final    Setup Time [id number]   Final    Culture     Final    Value:        BLOOD CULTURE RECEIVED NO GROWTH TO DATE CULTURE WILL BE HELD FOR 5 DAYS BEFORE ISSUING A FINAL NEGATIVE REPORT   Report Status PENDING   Incomplete   URINE CULTURE     Status: Normal   Collection Time   10/22/11  3:31 PM      Component Value Range Status Comment   Specimen Description URINE, CATHETERIZED   Final    Special Requests NONE   Final    Setup Time [id number]   Final    Colony Count NO GROWTH   Final    Culture NO GROWTH   Final    Report Status 10/23/2011 FINAL  Final     Studies/Results: Ct Abdomen Pelvis Wo Contrast  10/22/2011  *RADIOLOGY REPORT*  Clinical Data:  Leukocytosis, acute on chronic renal failure, respiratory failure, recent pneumonia  CT CHEST, ABDOMEN AND PELVIS WITHOUT CONTRAST  Technique:  Multidetector CT imaging of the chest, abdomen and pelvis was performed following the standard protocol without IV contrast.  Comparison:  [place] CT chest dated 09/22/2011, [facility] chest/abdomen/pelvis dated 10/19/2007  CT CHEST  Findings:  Moderate left pleural effusion.  Associated left lower lobe opacity, likely atelectasis, underlying pneumonia not excluded.  Minimal patchy opacities in the left upper lobe and posterior right upper lobe (series 3/image 24), possibly infectious.  Mild interstitial edema.  Underlying centrilobular emphysematous changes.  No pneumothorax.  Visualized thyroid is unremarkable.  The heart is top normal in size.  No pericardial effusion. Coronary atherosclerosis.  There is no calcification of the aortic arch.  No suspicious mediastinal or axillary lymphadenopathy.  Visualized osseous structures are  within normal limits.  IMPRESSION: Mild interstitial edema with a moderate left pleural effusion.  Associated left lower lobe opacity, likely atelectasis, underlying pneumonia not excluded.  Minimal patchy opacities in the left upper lobe and posterior right upper lobe, possibly infectious.  CT ABDOMEN AND PELVIS  Findings:  Small hiatal hernia.  Unenhanced liver, spleen, pancreas, and adrenal glands are within normal limits.  Layering small gallstones.  No associated inflammatory changes.  Severe right renal atrophy, chronic.  Left kidney is unremarkable. No hydronephrosis.  No evidence of bowel obstruction.  Colonic diverticulosis, without associated inflammatory changes.  Pericolonic stranding involving the distal sigmoid colon (series 2/image 110), worrisome for colitis.  Extensive atherosclerotic calcifications of the abdominal aorta and branch vessels.  No abdominopelvic ascites.  No suspicious abdominopelvic lymphadenopathy.  Uterus and bilateral ovaries are unremarkable.  Bladder is decompressed by indwelling Foley catheter.  Mild degenerative changes of the lumbar spine.  IMPRESSION: Pericolonic stranding involving the distal sigmoid colon, worrisome for colitis.  Cholelithiasis, without associated inflammatory changes.  Severe right renal atrophy, chronic.  Original Report Authenticated By: Charline Bills, M.D.   Ct Chest Wo Contrast  10/22/2011  *RADIOLOGY REPORT*  Clinical Data:  Leukocytosis, acute on chronic renal failure, respiratory failure, recent pneumonia  CT CHEST, ABDOMEN AND PELVIS WITHOUT CONTRAST  Technique:  Multidetector CT imaging of the chest, abdomen and pelvis was performed following the standard protocol without IV contrast.  Comparison:  [place] CT chest dated 09/22/2011, [facility] CT chest/abdomen/pelvis dated 10/19/2007  CT CHEST  Findings:  Moderate left pleural effusion.  Associated left lower lobe opacity, likely atelectasis, underlying pneumonia not excluded.  Minimal  patchy opacities in the left upper lobe and posterior right upper lobe (series 3/image 24), possibly infectious.  Mild interstitial edema.  Underlying centrilobular emphysematous changes.  No pneumothorax.  Visualized thyroid is unremarkable.  The heart is top normal in size.  No pericardial effusion. Coronary atherosclerosis.  There is no calcification of the aortic arch.  No suspicious mediastinal or axillary lymphadenopathy.  Visualized osseous structures are within normal limits.  IMPRESSION: Mild interstitial edema with a moderate left pleural effusion.  Associated left lower lobe opacity, likely atelectasis, underlying pneumonia not excluded.  Minimal patchy opacities in the left upper lobe and posterior right upper lobe, possibly infectious.  CT ABDOMEN AND PELVIS  Findings:  Small hiatal hernia.  Unenhanced liver, spleen, pancreas, and adrenal glands are within normal limits.  Layering small gallstones.  No associated inflammatory changes.  Severe right renal atrophy, chronic.  Left kidney is  unremarkable. No hydronephrosis.  No evidence of bowel obstruction.  Colonic diverticulosis, without associated inflammatory changes.  Pericolonic stranding involving the distal sigmoid colon (series 2/image 110), worrisome for colitis.  Extensive atherosclerotic calcifications of the abdominal aorta and branch vessels.  No abdominopelvic ascites.  No suspicious abdominopelvic lymphadenopathy.  Uterus and bilateral ovaries are unremarkable.  Bladder is decompressed by indwelling Foley catheter.  Mild degenerative changes of the lumbar spine.  IMPRESSION: Pericolonic stranding involving the distal sigmoid colon, worrisome for colitis.  Cholelithiasis, without associated inflammatory changes.  Severe right renal atrophy, chronic.  Original Report Authenticated By: Charline Bills, M.D.   US Renal  10/13/2011  *RADIOLOGY REPORT*  Clinical Data: Renal failure.  RENAL/URINARY TRACT ULTRASOUND COMPLETE  Comparison:   Ultrasound dated 09/19/2007 performed at [facility].  Findings:  Right Kidney:  The right kidney is atrophic, measuring 5.3 cm in length.  No hydronephrosis.  Renal cortex is markedly thinned.  Left Kidney:  Left kidney is hypertrophic with no hydronephrosis or mass.  Slight increased echogenicity of the renal parenchyma consistent with renal medical disease.   13.9 cm in length.  Bladder:  The bladder is empty.  Foley catheter in place.  IMPRESSION: Since the prior exam the right kidney is more atrophic and the left kidney is more hypertrophic.  The slight increase echogenicity of the renal parenchyma on the left is new since the prior study.  Original Report Authenticated By: Gwynn Burly, M.D.   Dg Chest Port 1 View  10/23/2011  *RADIOLOGY REPORT*  Clinical Data: Diatek catheter insertion.  PORTABLE CHEST - 1 VIEW  Comparison: Chest x-ray 12/22/2010.  Findings: The left IJ catheter is stable.  The right IJ diatek catheter is in good position.  The distal port is in the right atrium and the proximal port is near the cavoatrial junction.  No pneumothorax or hematoma.  The heart and lungs are unchanged since earlier film.  IMPRESSION: Right IJ diatek catheter in good position without complicating features.  Original Report Authenticated By: P. Loralie Champagne, M.D.   Dg Chest Port 1 View  10/22/2011  *RADIOLOGY REPORT*  Clinical Data: Pneumonia, follow-up  PORTABLE CHEST - 1 VIEW  Comparison: Portable chest x-ray of 10/19/2011  Findings: Opacity remains at the left lung base most consistent with effusion and atelectasis.  Pneumonia cannot be excluded.  The right lung is clear.  Bilateral IJ central venous lines are unchanged in position.  The heart is borderline enlarged.  IMPRESSION: No change in opacity primarily at the left lung base consistent with effusion, atelectasis, and possibly pneumonia.  Original Report Authenticated By: Juline Patch, M.D.   Dg Chest Port 1 View  10/19/2011   *RADIOLOGY REPORT*  Clinical Data: Shortness of breath.  PORTABLE CHEST - 1 VIEW  Comparison: 10/17/2011.  Findings: Right central line tip proximal superior vena cava level. Left central line tip proximal superior vena cava level directed laterally.  No gross pneumothorax.  Asymmetric air space disease greatest on the left and most notable in the left lung base may represent infectious infiltrate with atelectasis/consolidation left base.  Pulmonary edema not excluded.  Calcified aorta.  Cardiomegaly.  IMPRESSION: No significant change.  Original Report Authenticated By: Fuller Canada, M.D.   Dg Chest Port 1 View  10/17/2011  *RADIOLOGY REPORT*  Clinical Data: Central venous line placement  PORTABLE CHEST - 1 VIEW  Comparison: Portable chest x-ray of 10/17/2011  Findings: There is haziness throughout the left hemithorax most consistent with left effusion  with basilar atelectasis present bilaterally.  Slightly more opacification remains at the left lung base.  A left IJ central venous line remains with the tip in the mid upper SVC.  A new right IJ central venous line is present with the tip at the same location within the mid upper SVC.  No pneumothorax is seen.  Heart size is stable.  IMPRESSION:  1.  New right IJ central venous line tip in mid upper SVC.  No pneumothorax. 2.  Little change in left effusion and persistent opacity at the left lung base.  Original Report Authenticated By: Juline Patch, M.D.   Dg Chest Port 1 View  10/17/2011  *RADIOLOGY REPORT*  Clinical Data: Respiratory failure.  PORTABLE CHEST - 1 VIEW  Comparison: 10/16/2011.  Findings: Left IJ central line tip projects over the SVC.  Heart size normal.  Layering left pleural effusion and bibasilar air space disease persist, left greater than right.  IMPRESSION: Layering left pleural effusion and bibasilar air space disease, left greater than right, persist.  Original Report Authenticated By: Reyes Ivan, M.D.   Dg Chest Port 1  View  10/16/2011  *RADIOLOGY REPORT*  Clinical Data: Respiratory failure.  PORTABLE CHEST - 1 VIEW  Comparison: 10/14/2011.  Findings: Left IJ central line tip projects over the SVC.  Heart size stable.  Thoracic aorta is calcified.  There is a moderate left pleural effusion.  Mixed interstitial and airspace disease has improved slightly in the interval. Left lower lobe collapse/consolidation.  IMPRESSION: Slight interval improvement in mixed interstitial and airspace disease, which may be due to edema.  Moderate left pleural effusion.  Left lower lobe collapse/consolidation.  Original Report Authenticated By: Reyes Ivan, M.D.   Dg Chest Port 1 View  10/14/2011  *RADIOLOGY REPORT*  Clinical Data: Respiratory distress  PORTABLE CHEST - 1 VIEW  Comparison: Multiple recent previous exams.  Findings: 2206 hours. The cardiopericardial silhouette is enlarged. Left greater than right interstitial and basilar airspace disease persist.  Probable small bilateral pleural effusions.  Right IJ central line tip is stable, positioned the proximal SVC level. Telemetry leads overlie the chest.  IMPRESSION: Stable exam.  Cardiomegaly with asymmetric left greater than right interstitial and basilar airspace opacities.  Original Report Authenticated By: ERIC A. MANSELL, M.D.   Dg Chest Port 1 View  10/14/2011  *RADIOLOGY REPORT*  Clinical Data: Follow up edema and pleural effusion  PORTABLE CHEST - 1 VIEW  Comparison: 10/12/2011  Findings: Cardiomediastinal silhouette is stable.  Asymmetric mild interstitial edema left greater than right again noted without change in aeration.  Probable small bilateral pleural effusion again noted.  Stable left IJ central line position.  No diagnostic pneumothorax.  IMPRESSION: Asymmetric mild interstitial edema left greater than right again noted without change in aeration.  Probable small bilateral pleural effusion again noted.  Stable left IJ central line position.  Original Report  Authenticated By: Natasha Mead, M.D.   Portable Chest 1 View  10/12/2011  *RADIOLOGY REPORT*  Clinical Data: Shortness of breath.  PORTABLE CHEST - 1 VIEW 10/12/2011 0006 hours:  Comparison: Portable chest x-ray yesterday, 09/25/2011, and two- view chest x-ray 09/24/2011 [facility].  Findings: Left jugular central venous catheter tip in the upper SVC.  No evidence of pneumothorax mediastinal hematoma.  Cardiac silhouette enlarged.  Pulmonary venous hypertension and mild asymmetric interstitial pulmonary edema, left greater than right. Small bilateral pleural effusions, left greater than right.  Dense consolidation in the left lower lobe, unchanged.  IMPRESSION: New  asymmetric mild interstitial pulmonary edema, left greater than right.  Stable bilateral pleural effusions, left greater than right.  Stable dense left lower lobe atelectasis and/or pneumonia.  Original Report Authenticated By: Arnell Sieving, M.D.   Dg Fluoro Guide Cv Line-no Report  10/23/2011  CLINICAL DATA: dialysis catherter insertion   FLOURO GUIDE CV LINE  Fluoroscopy was utilized by the requesting physician.  No radiographic  interpretation.      Antibiotics:  Anti-infectives     Start     Dose/Rate Route Frequency Ordered Stop   10/23/11 1100   vancomycin (VANCOCIN) 50 mg/mL oral solution 125 mg        125 mg Oral 4 times daily 10/23/11 1059 11/06/11 0959   10/22/11 1400   vancomycin (VANCOCIN) 50 mg/mL oral solution 125 mg  Status:  Discontinued        125 mg Oral 4 times daily 10/22/11 1219 10/22/11 1726   10/15/11 1000   imipenem-cilastatin (PRIMAXIN) 250 mg in sodium chloride 0.9 % 100 mL IVPB        250 mg 200 mL/hr over 30 Minutes Intravenous Every 12 hours 10/15/11 0022     10/15/11 0030   imipenem-cilastatin (PRIMAXIN) 250 mg in sodium chloride 0.9 % 100 mL IVPB        250 mg 200 mL/hr over 30 Minutes Intravenous NOW 10/15/11 0022 10/15/11 0203   10/14/11 1800   ciprofloxacin (CIPRO) tablet 500 mg   Status:  Discontinued        500 mg Oral Daily 10/14/11 1623 10/14/11 2333   10/11/11 2359   moxifloxacin (AVELOX) IVPB 400 mg  Status:  Discontinued        400 mg 250 mL/hr over 60 Minutes Intravenous Every 24 hours 10/11/11 2306 10/13/11 1700          Medications: Scheduled Meds:   . ipratropium  0.5 mg Nebulization TID   And  . albuterol  2.5 mg Nebulization TID  . aspirin  81 mg Oral Daily  . calcium acetate  667 mg Oral TID WC  . clopidogrel  75 mg Oral Q breakfast  . darbepoetin (ARANESP) injection - DIALYSIS  200 mcg Intravenous Q Tue-HD  . docusate sodium  100 mg Oral BID  . ferric glucontate (NULECIT) IV  125 mg Intravenous Q M,W,F-HD  . guaiFENesin  600 mg Oral BID  . imipenem-cilastatin  250 mg Intravenous Q12H  . insulin aspart  0-20 Units Subcutaneous TID WC  . insulin aspart  0-5 Units Subcutaneous QHS  . insulin glargine  14 Units Subcutaneous QHS  . isosorbide mononitrate  120 mg Oral Daily  . levothyroxine  25 mcg Oral Q0600  . multivitamin  1 tablet Oral QHS  . polyethylene glycol  17 g Oral Daily  . sodium chloride      . vancomycin  125 mg Oral QID  . warfarin  2.5 mg Oral Once  . warfarin  2.5 mg Oral ONCE-1800  . DISCONTD: heparin  40 Units/kg Dialysis Once in dialysis  . DISCONTD: metoprolol tartrate  50 mg Oral BID   Continuous Infusions:   . heparin 11.5 mL/hr (10/24/11 0800)   PRN Meds:.sodium chloride, acetaminophen, acetaminophen, ALPRAZolam, alum & mag hydroxide-simeth, heparin, morphine injection, nitroGLYCERIN, ondansetron (ZOFRAN) IV, ondansetron, oxyCODONE, DISCONTD: heparin 6000 unit irrigation, DISCONTD: heparin, DISCONTD: heparin, DISCONTD: HYDROmorphone, DISCONTD: lidocaine-EPINEPHrine, DISCONTD: meperidine, DISCONTD: morphine, DISCONTD: ondansetron (ZOFRAN) IV, DISCONTD: sodium chloride irrigation  Assessment/Plan: [name] [name] is a 70 y.o. female with ESRD, volume overload,  being rx for ESBL with carbapenem now with unexplained but  impressive leukocytosis found to have colitis on CT scan. She actually claims that she has "chronic colitis"  1) Leukocytosis: we are treating for c difficile colitis with vancomycin  --continue empiric vancomycin orally for 14 day course  2) CHANGE CONTACT PRECATIONS FOR THIS HOSPITALIZATION TO [facility] FOR EMPRIIC C DIFFICILE  I will sign off for now please call with further questions.   LOS: 13 days   Acey Lav 10/24/2011, 10:10 AM

## 2011-10-24 NOTE — Progress Notes (Signed)
  Echocardiogram Limited 2-D echocardiogram for bubble study has been performed.  Marcel Sorter, Real Cons 10/24/2011, 10:15 AM

## 2011-10-24 NOTE — Progress Notes (Signed)
ANTICOAGULATION CONSULT NOTE - Follow Up Consult  Pharmacy Consult for Heparin / Coumadin Indication: DVT, r/o PE  Allergies  Allergen Reactions  . Novocain Other (See Comments)    Unknown    Patient Measurements: Height: 5\' 5"  (165.1 cm) Weight: 146 lb 9.7 oz (66.5 kg) IBW/kg (Calculated) : 57  Adjusted Body Weight:   Vital Signs: Temp: 98.2 F (36.8 C) (12/21 0800) Temp src: Axillary (12/21 0800) BP: 161/59 mmHg (12/21 0800) Pulse Rate: 97  (12/21 0800)  Labs:  Basename 10/24/11 0439 10/24/11 0430 10/23/11 0430 10/22/11 1330 10/22/11 0430  HGB 7.7* -- 7.4* -- --  HCT 24.6* -- 22.9* -- 24.6*  PLT 382 -- 375 -- 394  APTT -- -- -- -- --  LABPROT 20.8* -- 17.6* -- 15.6*  INR 1.76* -- 1.42 -- 1.21  HEPARINUNFRC 0.38 -- 0.52 0.61 --  CREATININE -- 2.54* 4.44* -- 3.69*  CKTOTAL -- -- -- -- --  CKMB -- -- -- -- --  TROPONINI -- -- -- -- --   Estimated Creatinine Clearance: 19.1 ml/min (by C-G formula based on Cr of 2.54).   Medications:  Heparin 1150 units/hr   Assessment: 70yof on Heparin for acute DVT, r/o PE. Heparin level (0.38) is therapeutic. INR=1.76 - H/H and Plts slight drop - No bleeding complications reported  Goal of Therapy:  Heparin level 0.3-0.7 units/ml   Plan:  1. Continue heparin 1150 units/hr (11.5 ml/hr) 2, Coumadin 2.5 mg po x 1 dose today 3. Follow up AM labs  Elwin Sleight [phone] 10/24/2011,8:57 AM

## 2011-10-24 NOTE — Progress Notes (Signed)
CSW faxed pt out to larger geographical area and awaiting bed offers. Will continue to follow to facilitate d/c planning, as appropriate to pt medical progression.  Baxter Flattery, MSW [phone]

## 2011-10-24 NOTE — Progress Notes (Signed)
Patient ID: [name] [name], female   DOB: 10-30-1941, 70 y.o.   MRN: [id number] I was present at this session.  I have reviewed the session itself and made appropriate changes.  Upn in chair doing better  Blayre Papania L 12/21/20123:51 PM

## 2011-10-24 NOTE — Progress Notes (Signed)
ANTIBIOTIC CONSULT NOTE - FOLLOW UP  Pharmacy Consult for Primaxin Day 9 Indication: E Coli UTI  Allergies  Allergen Reactions  . Novocain Other (See Comments)    Unknown    Patient Measurements: Height: 5\' 5"  (165.1 cm) Weight: 146 lb 9.7 oz (66.5 kg) IBW/kg (Calculated) : 57    Vital Signs: Temp: 98.2 F (36.8 C) (12/21 0800) Temp src: Axillary (12/21 0800) BP: 161/59 mmHg (12/21 0800) Pulse Rate: 97  (12/21 0800) Intake/Output from previous day: 12/20 0701 - 12/21 0700 In: 370 [I.V.:270; IV Piggyback:100] Out: 2164 [Urine:625] Intake/Output from this shift:    Labs:  Basename 10/24/11 0439 10/24/11 0430 10/23/11 0430 10/22/11 0430  WBC 21.5* -- 24.7* 34.2*  HGB 7.7* -- 7.4* 7.9*  PLT 382 -- 375 394  LABCREA -- -- -- --  CREATININE -- 2.54* 4.44* 3.69*   Estimated Creatinine Clearance: 19.1 ml/min (by C-G formula based on Cr of 2.54). No results found for this basename: VANCOTROUGH:2,VANCOPEAK:2,VANCORANDOM:2,GENTTROUGH:2,GENTPEAK:2,GENTRANDOM:2,TOBRATROUGH:2,TOBRAPEAK:2,TOBRARND:2,AMIKACINPEAK:2,AMIKACINTROU:2,AMIKACIN:2, in the last 72 hours   Microbiology: Recent Results (from the past 720 hour(s))  MRSA PCR SCREENING     Status: Normal   Collection Time   10/11/11  9:25 PM      Component Value Range Status Comment   MRSA by PCR NEGATIVE  NEGATIVE  Final   URINE CULTURE     Status: Normal   Collection Time   10/12/11  3:18 PM      Component Value Range Status Comment   Specimen Description URINE, CATHETERIZED   Final    Special Requests NONE   Final    Setup Time [id number]   Final    Colony Count >=100,000 COLONIES/ML   Final    Culture     Final    Value: ESCHERICHIA COLI     Note: Confirmed Extended Spectrum Beta-Lactamase Producer (ESBL)     Note: CRITICAL RESULT CALLED TO, READ BACK BY AND VERIFIED WITH: LACEY HITT 12/11 AT 2250 BY DUNNJ   Report Status 10/14/2011 FINAL   Final    Organism ID, Bacteria ESCHERICHIA COLI   Final   CULTURE, BLOOD  (ROUTINE X 2)     Status: Normal (Preliminary result)   Collection Time   10/22/11 12:45 PM      Component Value Range Status Comment   Specimen Description BLOOD RIGHT HAND   Final    Special Requests BOTTLES DRAWN AEROBIC AND ANAEROBIC 10CC   Final    Setup Time [id number]   Final    Culture     Final    Value:        BLOOD CULTURE RECEIVED NO GROWTH TO DATE CULTURE WILL BE HELD FOR 5 DAYS BEFORE ISSUING A FINAL NEGATIVE REPORT   Report Status PENDING   Incomplete   CULTURE, BLOOD (ROUTINE X 2)     Status: Normal (Preliminary result)   Collection Time   10/22/11 12:55 PM      Component Value Range Status Comment   Specimen Description BLOOD RIGHT WRIST   Final    Special Requests BOTTLES DRAWN AEROBIC ONLY 5CC   Final    Setup Time [id number]   Final    Culture     Final    Value:        BLOOD CULTURE RECEIVED NO GROWTH TO DATE CULTURE WILL BE HELD FOR 5 DAYS BEFORE ISSUING A FINAL NEGATIVE REPORT   Report Status PENDING   Incomplete   URINE CULTURE     Status: Normal  Collection Time   10/22/11  3:31 PM      Component Value Range Status Comment   Specimen Description URINE, CATHETERIZED   Final    Special Requests NONE   Final    Setup Time [id number]   Final    Colony Count NO GROWTH   Final    Culture NO GROWTH   Final    Report Status 10/23/2011 FINAL   Final     Anti-infectives     Start     Dose/Rate Route Frequency Ordered Stop   10/23/11 1100   vancomycin (VANCOCIN) 50 mg/mL oral solution 125 mg        125 mg Oral 4 times daily 10/23/11 1059 11/06/11 0959   10/22/11 1400   vancomycin (VANCOCIN) 50 mg/mL oral solution 125 mg  Status:  Discontinued        125 mg Oral 4 times daily 10/22/11 1219 10/22/11 1726   10/15/11 1000   imipenem-cilastatin (PRIMAXIN) 250 mg in sodium chloride 0.9 % 100 mL IVPB        250 mg 200 mL/hr over 30 Minutes Intravenous Every 12 hours 10/15/11 0022     10/15/11 0030   imipenem-cilastatin (PRIMAXIN) 250 mg in sodium chloride 0.9  % 100 mL IVPB        250 mg 200 mL/hr over 30 Minutes Intravenous NOW 10/15/11 0022 10/15/11 0203   10/14/11 1800   ciprofloxacin (CIPRO) tablet 500 mg  Status:  Discontinued        500 mg Oral Daily 10/14/11 1623 10/14/11 2333   10/11/11 2359   moxifloxacin (AVELOX) IVPB 400 mg  Status:  Discontinued        400 mg 250 mL/hr over 60 Minutes Intravenous Every 24 hours 10/11/11 2306 10/13/11 1700          Assessment: Day #9 of Primaxin, E Coli UTI, stop date today?  Goal of Therapy:  Appropriate dosing  Plan:  1) Continue Primaxin 250 mg IV q12 2) Follow up for dc  Thank you.   Elwin Sleight 10/24/2011,9:01 AM

## 2011-10-24 NOTE — Progress Notes (Addendum)
Subjective: Interval History: none.  Objective: Vital signs in last 24 hours: Temp:  [97 F (36.1 C)-99 F (37.2 C)] 98.2 F (36.8 C) (12/21 0800) Pulse Rate:  [95-116] 97  (12/21 0800) Resp:  [14-20] 16  (12/21 0800) BP: (79-161)/(37-79) 161/59 mmHg (12/21 0800) SpO2:  [95 %-100 %] 98 % (12/21 0832) FiO2 (%):  [8 %] 8 % (12/20 1757) Weight:  [65.4 kg (144 lb 2.9 oz)-66.7 kg (147 lb 0.8 oz)] 146 lb 9.7 oz (66.5 kg) (12/21 0500) Weight change: 6.9 kg (15 lb 3.4 oz)  Intake/Output from previous day: 12/20 0701 - 12/21 0700 In: 380 [I.V.:280; IV Piggyback:100] Out: 2164 [Urine:625] Intake/Output this shift: Total I/O In: 10 [I.V.:10] Out: 0   General appearance: fatigued, moderately obese and slowed mentation Resp: rales bibasilar Cardio: S1, S2 normal and systolic murmur: holosystolic 2/6, blowing at apex GI: soft,pos BS, liver down 4 cm Extremities: edema 1 plus  Lab Results:  [facility] 10/24/11 0439 10/23/11 0430  WBC 21.5* 24.7*  HGB 7.7* 7.4*  HCT 24.6* 22.9*  PLT 382 375   BMET:  Basename 10/24/11 0430 10/23/11 0430  NA 138 135  K 3.8 3.6  CL 97 93*  CO2 28 27  GLUCOSE 137* 56*  BUN 29* 68*  CREATININE 2.54* 4.44*  CALCIUM 9.0 9.4    Basename 10/21/11 1230  PTH 367.5*   Iron Studies: No results found for this basename: IRON,TIBC,TRANSFERRIN,FERRITIN in the last 72 hours  Studies/Results: Ct Abdomen Pelvis Wo Contrast  10/22/2011  *RADIOLOGY REPORT*  Clinical Data:  Leukocytosis, acute on chronic renal failure, respiratory failure, recent pneumonia  CT CHEST, ABDOMEN AND PELVIS WITHOUT CONTRAST  Technique:  Multidetector CT imaging of the chest, abdomen and pelvis was performed following the standard protocol without IV contrast.  Comparison:  [place] CT chest dated 09/22/2011, [facility] CT chest/abdomen/pelvis dated 10/19/2007  CT CHEST  Findings:  Moderate left pleural effusion.  Associated left lower lobe opacity, likely atelectasis, underlying  pneumonia not excluded.  Minimal patchy opacities in the left upper lobe and posterior right upper lobe (series 3/image 24), possibly infectious.  Mild interstitial edema.  Underlying centrilobular emphysematous changes.  No pneumothorax.  Visualized thyroid is unremarkable.  The heart is top normal in size.  No pericardial effusion. Coronary atherosclerosis.  There is no calcification of the aortic arch.  No suspicious mediastinal or axillary lymphadenopathy.  Visualized osseous structures are within normal limits.  IMPRESSION: Mild interstitial edema with a moderate left pleural effusion.  Associated left lower lobe opacity, likely atelectasis, underlying pneumonia not excluded.  Minimal patchy opacities in the left upper lobe and posterior right upper lobe, possibly infectious.  CT ABDOMEN AND PELVIS  Findings:  Small hiatal hernia.  Unenhanced liver, spleen, pancreas, and adrenal glands are within normal limits.  Layering small gallstones.  No associated inflammatory changes.  Severe right renal atrophy, chronic.  Left kidney is unremarkable. No hydronephrosis.  No evidence of bowel obstruction.  Colonic diverticulosis, without associated inflammatory changes.  Pericolonic stranding involving the distal sigmoid colon (series 2/image 110), worrisome for colitis.  Extensive atherosclerotic calcifications of the abdominal aorta and branch vessels.  No abdominopelvic ascites.  No suspicious abdominopelvic lymphadenopathy.  Uterus and bilateral ovaries are unremarkable.  Bladder is decompressed by indwelling Foley catheter.  Mild degenerative changes of the lumbar spine.  IMPRESSION: Pericolonic stranding involving the distal sigmoid colon, worrisome for colitis.  Cholelithiasis, without associated inflammatory changes.  Severe right renal atrophy, chronic.  Original Report Authenticated By: Charline Bills,  M.D.   Ct Chest Wo Contrast  10/22/2011  *RADIOLOGY REPORT*  Clinical Data:  Leukocytosis, acute on  chronic renal failure, respiratory failure, recent pneumonia  CT CHEST, ABDOMEN AND PELVIS WITHOUT CONTRAST  Technique:  Multidetector CT imaging of the chest, abdomen and pelvis was performed following the standard protocol without IV contrast.  Comparison:  [place] CT chest dated 09/22/2011, [facility] CT chest/abdomen/pelvis dated 10/19/2007  CT CHEST  Findings:  Moderate left pleural effusion.  Associated left lower lobe opacity, likely atelectasis, underlying pneumonia not excluded.  Minimal patchy opacities in the left upper lobe and posterior right upper lobe (series 3/image 24), possibly infectious.  Mild interstitial edema.  Underlying centrilobular emphysematous changes.  No pneumothorax.  Visualized thyroid is unremarkable.  The heart is top normal in size.  No pericardial effusion. Coronary atherosclerosis.  There is no calcification of the aortic arch.  No suspicious mediastinal or axillary lymphadenopathy.  Visualized osseous structures are within normal limits.  IMPRESSION: Mild interstitial edema with a moderate left pleural effusion.  Associated left lower lobe opacity, likely atelectasis, underlying pneumonia not excluded.  Minimal patchy opacities in the left upper lobe and posterior right upper lobe, possibly infectious.  CT ABDOMEN AND PELVIS  Findings:  Small hiatal hernia.  Unenhanced liver, spleen, pancreas, and adrenal glands are within normal limits.  Layering small gallstones.  No associated inflammatory changes.  Severe right renal atrophy, chronic.  Left kidney is unremarkable. No hydronephrosis.  No evidence of bowel obstruction.  Colonic diverticulosis, without associated inflammatory changes.  Pericolonic stranding involving the distal sigmoid colon (series 2/image 110), worrisome for colitis.  Extensive atherosclerotic calcifications of the abdominal aorta and branch vessels.  No abdominopelvic ascites.  No suspicious abdominopelvic lymphadenopathy.  Uterus and bilateral ovaries are  unremarkable.  Bladder is decompressed by indwelling Foley catheter.  Mild degenerative changes of the lumbar spine.  IMPRESSION: Pericolonic stranding involving the distal sigmoid colon, worrisome for colitis.  Cholelithiasis, without associated inflammatory changes.  Severe right renal atrophy, chronic.  Original Report Authenticated By: Charline Bills, M.D.   Dg Chest Port 1 View  10/23/2011  *RADIOLOGY REPORT*  Clinical Data: Diatek catheter insertion.  PORTABLE CHEST - 1 VIEW  Comparison: Chest x-ray 12/22/2010.  Findings: The left IJ catheter is stable.  The right IJ diatek catheter is in good position.  The distal port is in the right atrium and the proximal port is near the cavoatrial junction.  No pneumothorax or hematoma.  The heart and lungs are unchanged since earlier film.  IMPRESSION: Right IJ diatek catheter in good position without complicating features.  Original Report Authenticated By: P. Loralie Champagne, M.D.   Dg Fluoro Guide Cv Line-no Report  10/23/2011  CLINICAL DATA: dialysis catherter insertion   FLOURO GUIDE CV LINE  Fluoroscopy was utilized by the requesting physician.  No radiographic  interpretation.      I have reviewed the patient's current medications.  Assessment/Plan: 1 CRF for HD, needs perm access.   2 Anemia EPO, give Fe 3 DM controlled 4 Pneu controlled, ? Po AB 5 HPTH 6 [place] 7  P HD, EPO, perm access    LOS: 13 days   DETERDING,JAMES L 10/24/2011,9:11 AM    [facility] called and message left for Okey Regal regarding new permanent access placement desired on Monday 10/27/11 per Dr. Darrick Penna

## 2011-10-24 NOTE — Progress Notes (Signed)
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 She was treated with diuretics since admission but despite that developed increased oxygen requirements requiring NIMV and ICU transfer. PCCM was consulted to assist in management.  Lines / Drains: 12/08 L IJ TLC>>>  12/08 Foley>>>  12/14 R IJ dialysis catheter>>>12/20 12/20 R tunneled HD catheter >>>  Cultures: 12/09 UC>>>E.Coli resistant to Ciprofloxacin (ESBL) 12/19 BC>>>ntd 12/19 UC>>>ntd  Antibiotics: 12/08 Ciprofloxacin (UTI)>>>12/11  12/11 Imipenem>>>12/20 12/20  Vancomycin PO>>>  Tests / Events: 12/19  CXR>>>bibasilar densities (? Effusions / atelectasis / infiltrate) 12/10  Abdomen CT>>> colitis, cholelithiasis without cholecystitis 12/10  Chest CT>>>mild interstitial edema, moderate L pleural effusion, LLL opacity (likely atelectasis), minimal patchy opacities bilateral upper lobes 12/21  TEE bubble study >>> no evidence of shunt  Overnight:  Permanent HD catheter placed, temporary - removed.  Now on 4 l/min oxygen via cannula with SpO2 92-96%.  No significant dyspnea.  Vital Signs: Temp:  [97 F (36.1 C)-99.2 F (37.3 C)] 99.2 F (37.3 C) (12/21 1200) Pulse Rate:  [96-116] 106  (12/21 1200) Resp:  [14-20] 19  (12/21 1200) BP: (79-161)/(37-79) 153/59 mmHg (12/21 1200) SpO2:  [95 %-100 %] 99 % (12/21 1200) FiO2 (%):  [8 %] 8 % (12/20 1757) Weight:  [65.4 kg (144 lb 2.9 oz)-66.7 kg (147 lb 0.8 oz)] 146 lb 9.7 oz (66.5 kg) (12/21 0500) I/O last 3 completed shifts: In: 600 [P.O.:120; I.V.:380; IV Piggyback:100] Out: 2739 [Urine:1200; Other:1539]  Physical Examination: General:  No acute distress, comfortable Neuro:  Awake, alert and oriented HEENT:  PERRL Neck:  TLC site intact Cardiovascular:  RRR, no murmurs Lungs:   Bilateral air entry, no W/R/R Abdomen:  Soft, nontender, bowel sounds diminished Musculoskeletal:  No significant pedal edema Skin:  No rash  Ventilator settings: Vent Mode:  [-]  FiO2 (%):  [8 %] 8 %  Labs and Imaging:  Reviewed.  Please refer to the Assessment and Plan section for relevant results.  Assessment and Plan:  Sub-acute hypoxemic respiratory failure.  Multifactorial secondary to pulmonary edema/fluid overload, presumed PE (on Heparin), pneumonia (treated).  Oxygenation significantly improved, down form NRB to [place] 4 l/min.  No shunt per bubble study. -->supplemental oxygen, titrate to SpO2 >88%  Acute DVT R LE -->continue Heparin, stop after INR therapeutic -->Coumadin  CKD secondary to atrophic kideney, DM, HTN.  No evidence of hyperkalemia.  Lab 10/24/11 0430 10/23/11 0430 10/22/11 0430 10/21/11 0350 10/20/11 0432  CREATININE 2.54* 4.44* 3.69* 4.90* 4.19*    Lab 10/24/11 0430 10/23/11 0430 10/22/11 0430 10/21/11 0350 10/20/11 0432  K 3.8 3.6 4.2 3.5 3.7   -->HD per renal -->Lasix  UTI with ESBL E. Coli resistant to Ciprofloxacin, completed ABx course. -->no active treatment  Suspected C. Dif as rising WBC with evidence of colitis on abd CT.  Edmon Crape C.DIf PCR pending.  ID involved.  Lab 10/24/11 0439 10/23/11 0430 10/22/11 0430 10/21/11 0350 10/20/11 0439  WBC 21.5* 24.7* 34.2* 19.3* 17.0*   -->isolation -->trend WBC -->emprical Vancomycin PO  Diabetes mellitus, fair control.  Lab 10/24/11 1151 10/24/11 0013 10/23/11 1852 10/23/11 1621 10/23/11 1306  GLUCAP 153* 89 89 76 103*   -->BCBGM+SSI  -->Lantus  Hypertension / CAD. No active ischemia / arrhythmia.  --> Continue ASA, Plavix, Amlodipine, Clonidine, Hydralazine, Imdur, Metoprolol  Anemia, multifactorial.   Lab 10/24/11 0439 10/23/11 0430 10/22/11 0430 10/21/11 0350 10/20/11 0439  HCT 24.6* 22.9* 24.6* 23.2* 24.0*   -->Trend Hct  -->EPO per Renal  Hypothyroidism  --> Levothyroxin   Best  practices / Disposition  -->SDU status under PCCM -->Renal, ID consulting -->LCB no intubation, no CPR no cardioversion -->DVT Px is not indicated (Heparin gtt) -->GI Px is not indicated, will start Pepcid for indigestion -->diet  -->NO PICC per Renal  Orlean Bradford, M.D. Pulmonary and Critical Care Medicine [facility] Cell: [phone] Pager: [phone]  10/24/2011, 1:46 PM

## 2011-10-24 NOTE — Progress Notes (Signed)
[facility]  Daily Progress Note  Assessment/Planning: POD #1 s/p RIJV TDC placement   Eventually patient will need left arm AVG placement  As she now has TDC in placement, the permanent access will be delayed until medically stable  Pt's pulmonary status still requiring significant oxygen supplementation.  Call for re-evaluation once pulmonary status improved  Subjective  - 1 Day Post-Op  Cont somewhat short of breath  Objective Filed Vitals:   10/24/11 0417 10/24/11 0500 10/24/11 0800 10/24/11 0832  BP: 141/56  161/59   Pulse: 99  97   Temp: 98 F (36.7 C)  98.2 F (36.8 C)   TempSrc: Oral  Axillary   Resp: 19  16   Height:      Weight:  146 lb 9.7 oz (66.5 kg)    SpO2: 100%  100% 98%    Intake/Output Summary (Last 24 hours) at 10/24/11 0837 Last data filed at 10/24/11 0800  Gross per 24 hour  Intake    370 ml  Output   2164 ml  Net  -1794 ml    PULM  B rales, face mask oxygen CV  RRR GI  soft, NTND VASC  R neck and chest TDC exit site clean  Laboratory CBC    Component Value Date/Time   WBC 21.5* 10/24/2011 0439   HGB 7.7* 10/24/2011 0439   HCT 24.6* 10/24/2011 0439   PLT 382 10/24/2011 0439    BMET    Component Value Date/Time   NA 138 10/24/2011 0430   K 3.8 10/24/2011 0430   CL 97 10/24/2011 0430   CO2 28 10/24/2011 0430   GLUCOSE 137* 10/24/2011 0430   BUN 29* 10/24/2011 0430   CREATININE 2.54* 10/24/2011 0430   CALCIUM 9.0 10/24/2011 0430   GFRNONAA 18* 10/24/2011 0430   GFRAA 21* 10/24/2011 0430    Leonides Sake, MD [facility] Office: [phone] Pager: [phone]  10/24/2011, 8:37 AM

## 2011-10-25 ENCOUNTER — Inpatient Hospital Stay (HOSPITAL_COMMUNITY): Payer: Medicare Other

## 2011-10-25 LAB — PROTIME-INR: Prothrombin Time: 38.9 seconds — ABNORMAL HIGH (ref 11.6–15.2)

## 2011-10-25 LAB — RENAL FUNCTION PANEL
Albumin: 2.5 g/dL — ABNORMAL LOW (ref 3.5–5.2)
CO2: 27 mEq/L (ref 19–32)
Calcium: 9.2 mg/dL (ref 8.4–10.5)
GFR calc Af Amer: 22 mL/min — ABNORMAL LOW (ref >90)
GFR calc non Af Amer: 19 mL/min — ABNORMAL LOW (ref >90)
Phosphorus: 2.7 mg/dL (ref 2.3–4.6)
Sodium: 138 mEq/L (ref 135–145)

## 2011-10-25 LAB — CBC
HCT: 23.3 % — ABNORMAL LOW (ref 36.0–46.0)
MCHC: 30.9 g/dL (ref 30.0–36.0)
RDW: 18.1 % — ABNORMAL HIGH (ref 11.5–15.5)

## 2011-10-25 LAB — GLUCOSE, CAPILLARY

## 2011-10-25 MED ORDER — SODIUM CHLORIDE 0.9 % IJ SOLN
INTRAMUSCULAR | Status: AC
  Filled 2011-10-25: qty 30

## 2011-10-25 NOTE — Progress Notes (Signed)
Received call from Dr. Vassie Loll, stated patient does not need to be on telemetry.

## 2011-10-25 NOTE — Progress Notes (Signed)
ANTIBIOTIC CONSULT NOTE - FOLLOW UP  Pharmacy Consult for Primaxin/Heparin/Warfarin Indication: E Coli UTI  Allergies  Allergen Reactions  . Novocain Other (See Comments)    Unknown    Patient Measurements: Height: 5\' 5"  (165.1 cm) Weight: 143 lb 11.8 oz (65.2 kg) IBW/kg (Calculated) : 57    Vital Signs: Temp: 97.4 F (36.3 C) (12/22 0730) Temp src: Oral (12/22 0730) BP: 71/58 mmHg (12/22 1000) Pulse Rate: 104  (12/22 1000) Intake/Output from previous day: 12/21 0701 - 12/22 0700 In: 1266.1 [P.O.:560; I.V.:706.1] Out: 1096 [Urine:350] Intake/Output from this shift:    Labs:  Basename 10/25/11 0810 10/25/11 0700 10/24/11 0439 10/24/11 0430 10/23/11 0430  WBC -- 19.8* 21.5* -- 24.7*  HGB -- 7.2* 7.7* -- 7.4*  PLT -- 355 382 -- 375  LABCREA -- -- -- -- --  CREATININE 2.45* -- -- 2.54* 4.44*   Estimated Creatinine Clearance: 19.8 ml/min (by C-G formula based on Cr of 2.45). No results found for this basename: VANCOTROUGH:2,VANCOPEAK:2,VANCORANDOM:2,GENTTROUGH:2,GENTPEAK:2,GENTRANDOM:2,TOBRATROUGH:2,TOBRAPEAK:2,TOBRARND:2,AMIKACINPEAK:2,AMIKACINTROU:2,AMIKACIN:2, in the last 72 hours   Microbiology: Recent Results (from the past 720 hour(s))  MRSA PCR SCREENING     Status: Normal   Collection Time   10/11/11  9:25 PM      Component Value Range Status Comment   MRSA by PCR NEGATIVE  NEGATIVE  Final   URINE CULTURE     Status: Normal   Collection Time   10/12/11  3:18 PM      Component Value Range Status Comment   Specimen Description URINE, CATHETERIZED   Final    Special Requests NONE   Final    Setup Time [id number]   Final    Colony Count >=100,000 COLONIES/ML   Final    Culture     Final    Value: ESCHERICHIA COLI     Note: Confirmed Extended Spectrum Beta-Lactamase Producer (ESBL)     Note: CRITICAL RESULT CALLED TO, READ BACK BY AND VERIFIED WITH: LACEY HITT 12/11 AT 2250 BY DUNNJ   Report Status 10/14/2011 FINAL   Final    Organism ID, Bacteria  ESCHERICHIA COLI   Final   CULTURE, BLOOD (ROUTINE X 2)     Status: Normal (Preliminary result)   Collection Time   10/22/11 12:45 PM      Component Value Range Status Comment   Specimen Description BLOOD RIGHT HAND   Final    Special Requests BOTTLES DRAWN AEROBIC AND ANAEROBIC 10CC   Final    Setup Time [id number]   Final    Culture     Final    Value:        BLOOD CULTURE RECEIVED NO GROWTH TO DATE CULTURE WILL BE HELD FOR 5 DAYS BEFORE ISSUING A FINAL NEGATIVE REPORT   Report Status PENDING   Incomplete   CULTURE, BLOOD (ROUTINE X 2)     Status: Normal (Preliminary result)   Collection Time   10/22/11 12:55 PM      Component Value Range Status Comment   Specimen Description BLOOD RIGHT WRIST   Final    Special Requests BOTTLES DRAWN AEROBIC ONLY 5CC   Final    Setup Time [id number]   Final    Culture     Final    Value:        BLOOD CULTURE RECEIVED NO GROWTH TO DATE CULTURE WILL BE HELD FOR 5 DAYS BEFORE ISSUING A FINAL NEGATIVE REPORT   Report Status PENDING   Incomplete   URINE CULTURE  Status: Normal   Collection Time   10/22/11  3:31 PM      Component Value Range Status Comment   Specimen Description URINE, CATHETERIZED   Final    Special Requests NONE   Final    Setup Time [id number]   Final    Colony Count NO GROWTH   Final    Culture NO GROWTH   Final    Report Status 10/23/2011 FINAL   Final     Anti-infectives     Start     Dose/Rate Route Frequency Ordered Stop   10/23/11 1100   vancomycin (VANCOCIN) 50 mg/mL oral solution 125 mg        125 mg Oral 4 times daily 10/23/11 1059 11/06/11 0959   10/22/11 1400   vancomycin (VANCOCIN) 50 mg/mL oral solution 125 mg  Status:  Discontinued        125 mg Oral 4 times daily 10/22/11 1219 10/22/11 1726   10/15/11 1000   imipenem-cilastatin (PRIMAXIN) 250 mg in sodium chloride 0.9 % 100 mL IVPB        250 mg 200 mL/hr over 30 Minutes Intravenous Every 12 hours 10/15/11 0022     10/15/11 0030    imipenem-cilastatin (PRIMAXIN) 250 mg in sodium chloride 0.9 % 100 mL IVPB        250 mg 200 mL/hr over 30 Minutes Intravenous NOW 10/15/11 0022 10/15/11 0203   10/14/11 1800   ciprofloxacin (CIPRO) tablet 500 mg  Status:  Discontinued        500 mg Oral Daily 10/14/11 1623 10/14/11 2333   10/11/11 2359   moxifloxacin (AVELOX) IVPB 400 mg  Status:  Discontinued        400 mg 250 mL/hr over 60 Minutes Intravenous Every 24 hours 10/11/11 2306 10/13/11 1700          Assessment: Day #11 of Primaxin for E. Coli UTI, stop date was supposed to be 12/21, need to f/u stop date. Urine cx 12/19 NGTD, blood cx 12/19 NGTD x2. Patient is afebrile with significant leukocytosis likely 2/2 C diff. Day #2/14 (per ID) vancomycin 125 mg four times daily. WBC 19.8<---21.5, afebrile. Has had 1 stool occurrence today per documentation but more per notes. No C diff PCR done. Anticoagulation for DVT/ r/o PE: INR 3.91<---1.76 after 2.5 mg x2 then 5 mg x1. Will hold today. Nose bleed this AM on heparin, MD note to stop heparin.  Goal of Therapy:  INR 2-3 D/C primaxin today for day#11/10 for E coli UTI  Plan:  1) Continue Primaxin 250 mg IV q12 2) Follow up for dc 3) Hold Coumadin today d/t nose bleed and massive increase in INR 4) F/U INR, nose bleed symptoms for resumption of coumadin  Thank you.   Katrinka Blazing, [place] R 10/25/2011,10:30 AM

## 2011-10-25 NOTE — Progress Notes (Signed)
md notified to regarding telemetry. Patient did not transfer with telemetry order. Awaiting response.

## 2011-10-25 NOTE — Progress Notes (Signed)
Pt arrived in dialysis  And blew her nose which dislodged a clot in her nose and a nose bleed started. Called Dr. Darrick Penna  And order received to stop heparin drip. Drip stopped and pt held pressure on her nose which stopped the bleed after about 10 minutes.

## 2011-10-25 NOTE — Progress Notes (Signed)
Patient ID: [name] [name], female   DOB: [birth year], 70 y.o.   MRN: [id number] I was present at this session.  I have reviewed the session itself and made appropriate changes. Nose bleed stop hep  Karletta Millay L 12/22/20129:04 AM

## 2011-10-25 NOTE — Progress Notes (Signed)
Subjective: Interval History: has complaints needs a [facility].  Objective: Vital signs in last 24 hours: Temp:  [96.8 F (36 C)-99.2 F (37.3 C)] 98.1 F (36.7 C) (12/22 0300) Pulse Rate:  [97-116] 106  (12/22 0300) Resp:  [16-22] 20  (12/22 0300) BP: (79-214)/(37-89) 155/89 mmHg (12/22 0300) SpO2:  [91 %-100 %] 100 % (12/22 0300) Weight:  [65.1 kg (143 lb 8.3 oz)-66.5 kg (146 lb 9.7 oz)] 143 lb 8.3 oz (65.1 kg) (12/21 1830) Weight change: -0.2 kg (-7.1 oz)  Intake/Output from previous day: 12/21 0701 - 12/22 0700 In: 1246.3 [P.O.:560; I.V.:686.3] Out: 1096 [Urine:350] Intake/Output this shift:    General appearance: alert, cooperative, moderately obese and pale Resp: diminished breath sounds bibasilar Chest wall: no tenderness, PC on R Cardio: S1, S2 normal and systolic murmur: systolic ejection 2/6, decrescendo at 2nd left intercostal space GI: liver down 3 cm pos bs  Lab Results:  [facility] 10/24/11 0439 10/23/11 0430  WBC 21.5* 24.7*  HGB 7.7* 7.4*  HCT 24.6* 22.9*  PLT 382 375   BMET:  Basename 10/24/11 0430 10/23/11 0430  NA 138 135  K 3.8 3.6  CL 97 93*  CO2 28 27  GLUCOSE 137* 56*  BUN 29* 68*  CREATININE 2.54* 4.44*  CALCIUM 9.0 9.4   No results found for this basename: PTH:2 in the last 72 hours Iron Studies: No results found for this basename: IRON,TIBC,TRANSFERRIN,FERRITIN in the last 72 hours  Studies/Results: Dg Chest Port 1 View  10/23/2011  *RADIOLOGY REPORT*  Clinical Data: Diatek catheter insertion.  PORTABLE CHEST - 1 VIEW  Comparison: Chest x-ray 12/22/2010.  Findings: The left IJ catheter is stable.  The right IJ diatek catheter is in good position.  The distal port is in the right atrium and the proximal port is near the cavoatrial junction.  No pneumothorax or hematoma.  The heart and lungs are unchanged since earlier film.  IMPRESSION: Right IJ diatek catheter in good position without complicating features.  Original Report  Authenticated By: P. Loralie Champagne, M.D.   Dg Fluoro Guide Cv Line-no Report  10/23/2011  CLINICAL DATA: dialysis catherter insertion   FLOURO GUIDE CV LINE  Fluoroscopy was utilized by the requesting physician.  No radiographic  interpretation.      I have reviewed the patient's current medications.  Assessment/Plan: 1 CRF on Hd Stable, needs perm access, awaiting VVS 2 DVT anticoag 3 Pulm better 4 Anemia stable, EPO, Fe 5 HPTH  6 [place] P HD, EPO, anticoag    LOS: 14 days   [name] [name] L 10/25/2011,7:29 AM

## 2011-10-25 NOTE — Progress Notes (Signed)
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 ED on 12/08, where she presented with progressive weakness. She was treated with diuretics since admission but despite that developed increased oxygen requirements requiring NIMV and ICU transfer. PCCM was consulted to assist in management.  Lines / Drains: 12/08 L IJ TLC>>>  12/14 R IJ dialysis catheter>>>12/20 12/20 R tunneled HD catheter >>>  Cultures: 12/09 UC>>>E.Coli resistant to Ciprofloxacin (ESBL) 12/19 BC>>>ntd 12/19 UC>>>ntd  Antibiotics: 12/08 Ciprofloxacin (UTI)>>>12/11  12/11 Imipenem>>> 12/22 12/19  Vancomycin PO (possible c diff)>>>  Tests / Events: 12/19  CXR>>>bibasilar densities (? Effusions / atelectasis / infiltrate) 12/10  Abdomen CT>>> colitis, cholelithiasis without cholecystitis 12/10  Chest CT>>>mild interstitial edema, moderate L pleural effusion, LLL opacity (likely atelectasis), minimal patchy opacities bilateral upper lobes 12/21  TEE bubble study >>> no evidence of shunt  Overnight: .  Now on 4 l/min oxygen via cannula with SpO2 92-96%.  No significant dyspnea or desats  Vital Signs: Temp:  [97.4 F (36.3 C)-98.4 F (36.9 C)] 98 F (36.7 C) (12/22 1256) Pulse Rate:  [70-116] 70  (12/22 1256) Resp:  [16-23] 23  (12/22 1256) BP: (71-214)/(37-89) 120/50 mmHg (12/22 1256) SpO2:  [93 %-100 %] 96 % (12/22 1450) Weight:  [140 lb 14 oz (63.9 kg)-143 lb 11.8 oz (65.2 kg)] 140 lb 14 oz (63.9 kg) (12/22 1200) I/O last 3 completed shifts: In: 1346.1 [P.O.:560; I.V.:786.1] Out: 2885 [Urine:600; Other:2285]  Physical Examination: General:  No acute distress, comfortable Neuro:  Awake, alert and oriented HEENT:  PERRL Neck:  TLC site intact Cardiovascular:  RRR, no murmurs Lungs:  Bilateral air entry, no W/R/R Abdomen:  Soft, nontender, bowel sounds diminished Musculoskeletal:  No  significant pedal edema Skin:  No rash      Labs and Imaging:  Reviewed.  Please refer to the Assessment and Plan section for relevant results.  Assessment and Plan:  Sub-acute hypoxemic respiratory failure.  Multifactorial secondary to pulmonary edema/fluid overload, presumed PE (on Heparin), pneumonia (treated).  Oxygenation significantly improved, down form NRB to [place] 4 l/min.  No shunt per bubble study. -->supplemental oxygen, titrate to SpO2 >88%  Acute DVT R LE -->continue Heparin, stop after INR therapeutic -->Coumadin  CKD secondary to atrophic kideney, DM, HTN.  No evidence of hyperkalemia.  Lab 10/25/11 0810 10/24/11 0430 10/23/11 0430 10/22/11 0430 10/21/11 0350  K 3.5 3.8 3.6 4.2 3.5   -->HD per renal   UTI with ESBL E. Coli resistant to Ciprofloxacin, completed ABx course. -->d/c primaxin per dashboard  Suspected C. Dif as rising WBC with evidence of colitis on abd CT.  Edmon Crape C.DIf PCR pending.  ID involved.  Lab 10/25/11 0700 10/24/11 0439 10/23/11 0430 10/22/11 0430 10/21/11 0350  WBC 19.8* 21.5* 24.7* 34.2* 19.3*   -->isolation -->trend WBC -->emprical Vancomycin PO  Diabetes mellitus, fair control.  Lab 10/25/11 1300 10/24/11 2209 10/24/11 2035 10/24/11 1151 10/24/11 0013  GLUCAP 143* 210* 187* 153* 89   -->BCBGM+SSI  -->Lantus  Hypertension / CAD. No active ischemia / arrhythmia.  --> Continue ASA, Plavix, Amlodipine, Clonidine, Hydralazine, Imdur, Metoprolol   Anemia, multifactorial.   Lab 10/25/11 0700 10/24/11 0439 10/23/11 0430 10/22/11 0430 10/21/11 0350  HCT 23.3* 24.6* 22.9* 24.6* 23.2*   -->Trend Hct  -->EPO per Renal  Hypothyroidism  --> Levothyroxin   Best practices / Disposition  -->SDU status under PCCM -->Renal  consulting -->LCB no intubation, no CPR no cardioversion -->DVT Px is not indicated (Heparin gtt) -->GI Px is not indicated,  Pepcid for indigestion -->diet  -->NO PICC per Renal     Sandrea Hughs,  MD Pulmonary and Critical Care Medicine [facility] Cell [phone]

## 2011-10-25 NOTE — Progress Notes (Signed)
VASCULAR PROGRESS NOTE  SUBJECTIVE: patient having significant diarrhea.  PHYSICAL EXAM: Filed Vitals:   10/24/11 1900 10/24/11 2023 10/24/11 2300 10/25/11 0300  BP: 121/60  139/66 155/89  Pulse: 112  116 106  Temp: 98 F (36.7 C)  97.8 F (36.6 C) 98.1 F (36.7 C)  TempSrc: Oral  Oral Oral  Resp: 16  17 20   Height:      Weight:      SpO2: 100% 100% 94% 100%   Lungs: clear to auscultation  LABS: Lab Results  Component Value Date   WBC 21.5* 10/24/2011   HGB 7.7* 10/24/2011   HCT 24.6* 10/24/2011   MCV 82.8 10/24/2011   PLT 382 10/24/2011   Lab Results  Component Value Date   CREATININE 2.54* 10/24/2011   Lab Results  Component Value Date   INR 1.76* 10/24/2011   CBG (last 3)   Basename 10/24/11 2209 10/24/11 2035 10/24/11 1151  GLUCAP 210* 187* 153*   ASSESSMENT/PLAN: 1. 2 Days Post-Op s/p: INSERTION OF DIALYSIS CATHETER 2. We have been asked to proceed with placement of permanent access. She has had a significant leukocytosis and also has colitis on CT scan. She is being treated for C. Difficile with vancomycin. She has also had significant pulmonary issues as noted by Dr. Imogene Burn.  3. Her vein mapping had suggested that she would require a left arm AV graft as noted by Dr. Imogene Burn. However, in reviewing her vein mapping it looks like she could potentially have a right basilic vein transposition. If this vein were not adequate and she would require an AV graft. I would agree that given her active colitis, pulmonary issues, and leukocytosis it might be best to wait until she is more medically stable. Will follow.  Waverly Ferrari, MD, FACS Beeper: [phone] 10/25/2011

## 2011-10-26 LAB — CBC
MCH: 27.2 pg (ref 26.0–34.0)
MCHC: 31.6 g/dL (ref 30.0–36.0)
Platelets: 368 10*3/uL (ref 150–400)
RBC: 2.61 MIL/uL — ABNORMAL LOW (ref 3.87–5.11)
RDW: 18.4 % — ABNORMAL HIGH (ref 11.5–15.5)

## 2011-10-26 LAB — GLUCOSE, CAPILLARY
Glucose-Capillary: 140 mg/dL — ABNORMAL HIGH (ref 70–99)
Glucose-Capillary: 190 mg/dL — ABNORMAL HIGH (ref 70–99)
Glucose-Capillary: 149 mg/dL — ABNORMAL HIGH (ref 70–99)

## 2011-10-26 LAB — HEPARIN LEVEL (UNFRACTIONATED): Heparin Unfractionated: <0.1 IU/mL — ABNORMAL LOW (ref 0.30–0.70)

## 2011-10-26 MED ORDER — ENOXAPARIN SODIUM 60 MG/0.6ML ~~LOC~~ SOLN
55.0000 mg | Freq: Every day | SUBCUTANEOUS | Status: DC
  Administered 2011-10-26 – 2011-10-29 (×4): 55 mg via SUBCUTANEOUS
  Administered 2011-10-30: 11:00:00 via SUBCUTANEOUS
  Administered 2011-11-01: 55 mg via SUBCUTANEOUS
  Filled 2011-10-26 (×7): qty 0.6

## 2011-10-26 MED ORDER — SODIUM CHLORIDE 0.9 % IV SOLN: 100.0000 mL | INTRAVENOUS | Status: DC | PRN

## 2011-10-26 MED ORDER — HEPARIN SODIUM (PORCINE) 1000 UNIT/ML DIALYSIS: 1000.0000 [IU] | INTRAMUSCULAR | Status: DC | PRN

## 2011-10-26 MED ORDER — NEPRO/CARBSTEADY PO LIQD: 237.0000 mL | ORAL | Status: DC | PRN

## 2011-10-26 MED ORDER — WARFARIN SODIUM 1 MG PO TABS
1.0000 mg | ORAL_TABLET | Freq: Once | ORAL | Status: DC
  Filled 2011-10-26: qty 1

## 2011-10-26 MED ORDER — ALTEPLASE 2 MG IJ SOLR
2.0000 mg | Freq: Once | INTRAMUSCULAR | Status: AC | PRN
  Filled 2011-10-26: qty 2

## 2011-10-26 MED ORDER — PENTAFLUOROPROP-TETRAFLUOROETH EX AERO: 1.0000 "application " | INHALATION_SPRAY | CUTANEOUS | Status: DC | PRN

## 2011-10-26 MED ORDER — LIDOCAINE HCL (PF) 1 % IJ SOLN: 5.0000 mL | INTRAMUSCULAR | Status: DC | PRN

## 2011-10-26 MED ORDER — LIDOCAINE-PRILOCAINE 2.5-2.5 % EX CREA: 1.0000 "application " | TOPICAL_CREAM | CUTANEOUS | Status: DC | PRN

## 2011-10-26 MED ORDER — HEPARIN SODIUM (PORCINE) 1000 UNIT/ML DIALYSIS: 100.0000 [IU]/kg | INTRAMUSCULAR | Status: DC | PRN

## 2011-10-26 NOTE — Progress Notes (Signed)
VASCULAR PROGRESS NOTE  SUBJECTIVE: complains of nausea  PHYSICAL EXAM: Filed Vitals:   10/25/11 2210 10/25/11 2216 10/26/11 0630 10/26/11 0918  BP: 157/72  158/75   Pulse: 104  103   Temp: 99 F (37.2 C)  98.5 F (36.9 C)   TempSrc: Oral  Oral   Resp: 18  18   Height:      Weight: 119 lb 11.4 oz (54.3 kg)     SpO2: 96% 96% 95% 96%   Lungs: clear to auscultation Palpable right radial pulse  LABS: Lab Results  Component Value Date   WBC 18.0* 10/26/2011   HGB 7.1* 10/26/2011   HCT 22.5* 10/26/2011   MCV 86.2 10/26/2011   PLT 368 10/26/2011   Lab Results  Component Value Date   CREATININE 2.45* 10/25/2011   Lab Results  Component Value Date   INR 2.93* 10/26/2011   CBG (last 3)   Basename 10/26/11 0819 10/25/11 1722 10/25/11 1300  GLUCAP 140* 163* 143*   ASSESSMENT/PLAN: 1. As per my note yesterday, we would prefer to wait on permanent access until she is feeling stronger. She has a persistent leukocytosis with colitis. She's been treated for C. Difficile with vancomycin. She has some persistent nausea. 1 she is feeling stronger I would recommend exploration of her right basilic vein for possible basilic vein transposition. If this is not adequate then she would require an AV graft. Please reconsult when she is ready for placement of permanent access. Office [phone].  Waverly Ferrari, MD, FACS Beeper: [phone] 10/26/2011

## 2011-10-26 NOTE — Progress Notes (Signed)
PATIENT DETAILS Name: [name] [name] Age: 70 y.o. Sex: female Date of Birth: 1940-12-31 Admit Date: 10/11/2011 PCP:No primary provider on file.  Subjective: Seen and examined, has no major complaints today.  Objective: Vital signs in last 24 hours: Filed Vitals:   10/25/11 2216 10/26/11 0630 10/26/11 0918 10/26/11 1015  BP:  158/75  159/77  Pulse:  103  120  Temp:  98.5 F (36.9 C)  98 F (36.7 C)  TempSrc:  Oral  Oral  Resp:  18  20  Height:      Weight:      SpO2: 96% 95% 96% 94%    Weight change: -1.3 kg (-2 lb 13.9 oz)  Body mass index is 19.92 kg/(m^2).  Intake/Output from previous day:  Intake/Output Summary (Last 24 hours) at 10/26/11 1334 Last data filed at 10/26/11 0900  Gross per 24 hour  Intake    820 ml  Output      0 ml  Net    820 ml    PHYSICAL EXAM: Gen Exam: Awake and alert with clear speech.   Neck: Supple, No JVD.   Chest: B/L Clear.   CVS: S1 S2 Regular, no murmurs.  Abdomen: soft, BS +, non tender, non distended.  Extremities: no edema, lower extremities warm to touch. Neurologic: Non Focal.   Skin: No Rash.   Wounds: N/A.    CONSULTS:  nephrology  LAB RESULTS: CBC  Lab 10/26/11 0615 10/25/11 0700 10/24/11 0439 10/23/11 0430 10/22/11 0430 10/21/11 0350  WBC 18.0* 19.8* 21.5* 24.7* 34.2* --  HGB 7.1* 7.2* 7.7* 7.4* 7.9* --  HCT 22.5* 23.3* 24.6* 22.9* 24.6* --  PLT 368 355 382 375 394 --  MCV 86.2 85.7 82.8 82.1 81.7 --  MCH 27.2 26.5 25.9* 26.5 26.2 --  MCHC 31.6 30.9 31.3 32.3 32.1 --  RDW 18.4* 18.1* 17.7* 17.3* 16.9* --  LYMPHSABS -- -- -- -- -- 1.9  MONOABS -- -- -- -- -- 1.5*  EOSABS -- -- -- -- -- 0.4  BASOSABS -- -- -- -- -- 0.2*  BANDABS -- -- -- -- -- --    Chemistries   Lab 10/25/11 0810 10/24/11 0430 10/23/11 0430 10/22/11 0430 10/21/11 0350  NA 138 138 135 135 127*  K 3.5 3.8 3.6 4.2 3.5  CL 99 97 93* 94* 84*  CO2 27 28 27 26 26   GLUCOSE 102* 137* 56* 155* 106*  BUN 25* 29* 68* 48* 76*  CREATININE 2.45* 2.54*  4.44* 3.69* 4.90*  CALCIUM 9.2 9.0 9.4 8.9 8.8  MG -- -- -- -- --    GFR Estimated Creatinine Clearance: 18.8 ml/min (by C-G formula based on Cr of 2.45).  Coagulation profile  Lab 10/26/11 0615 10/25/11 0700 10/24/11 0439 10/23/11 0430 10/22/11 0430  INR 2.93* 3.91* 1.76* 1.42 1.21  PROTIME -- -- -- -- --    Cardiac Enzymes No results found for this basename: CK:3,CKMB:3,TROPONINI:3,MYOGLOBIN:3 in the last 168 hours  No components found with this basename: POCBNP:3 No results found for this basename: DDIMER:2 in the last 72 hours No results found for this basename: HGBA1C:2 in the last 72 hours No results found for this basename: CHOL:2,HDL:2,LDLCALC:2,TRIG:2,CHOLHDL:2,LDLDIRECT:2 in the last 72 hours No results found for this basename: TSH,T4TOTAL,FREET3,T3FREE,THYROIDAB in the last 72 hours No results found for this basename: VITAMINB12:2,FOLATE:2,FERRITIN:2,TIBC:2,IRON:2,RETICCTPCT:2 in the last 72 hours No results found for this basename: LIPASE:2,AMYLASE:2 in the last 72 hours  Urine Studies No results found for this basename: UACOL:2,UAPR:2,USPG:2,UPH:2,UTP:2,UGL:2,UKET:2,UBIL:2,UHGB:2,UNIT:2,UROB:2,ULEU:2,UEPI:2,UWBC:2,URBC:2,UBAC:2,CAST:2,CRYS:2,UCOM:2,BILUA:2 in the last 72  hours  MICROBIOLOGY: Recent Results (from the past 240 hour(s))  CULTURE, BLOOD (ROUTINE X 2)     Status: Normal (Preliminary result)   Collection Time   10/22/11 12:45 PM      Component Value Range Status Comment   Specimen Description BLOOD RIGHT HAND   Final    Special Requests BOTTLES DRAWN AEROBIC AND ANAEROBIC 10CC   Final    Setup Time [id number]   Final    Culture     Final    Value:        BLOOD CULTURE RECEIVED NO GROWTH TO DATE CULTURE WILL BE HELD FOR 5 DAYS BEFORE ISSUING A FINAL NEGATIVE REPORT   Report Status PENDING   Incomplete   CULTURE, BLOOD (ROUTINE X 2)     Status: Normal (Preliminary result)   Collection Time   10/22/11 12:55 PM      Component Value Range Status  Comment   Specimen Description BLOOD RIGHT WRIST   Final    Special Requests BOTTLES DRAWN AEROBIC ONLY 5CC   Final    Setup Time [id number]   Final    Culture     Final    Value:        BLOOD CULTURE RECEIVED NO GROWTH TO DATE CULTURE WILL BE HELD FOR 5 DAYS BEFORE ISSUING A FINAL NEGATIVE REPORT   Report Status PENDING   Incomplete   URINE CULTURE     Status: Normal   Collection Time   10/22/11  3:31 PM      Component Value Range Status Comment   Specimen Description URINE, CATHETERIZED   Final    Special Requests NONE   Final    Setup Time [id number]   Final    Colony Count NO GROWTH   Final    Culture NO GROWTH   Final    Report Status 10/23/2011 FINAL   Final     RADIOLOGY STUDIES/RESULTS: Ct Abdomen Pelvis Wo Contrast  10/22/2011  *RADIOLOGY REPORT*  Clinical Data:  Leukocytosis, acute on chronic renal failure, respiratory failure, recent pneumonia  CT CHEST, ABDOMEN AND PELVIS WITHOUT CONTRAST  Technique:  Multidetector CT imaging of the chest, abdomen and pelvis was performed following the standard protocol without IV contrast.  Comparison:  [place] CT chest dated 09/22/2011, [facility] CT chest/abdomen/pelvis dated 10/19/2007  CT CHEST  Findings:  Moderate left pleural effusion.  Associated left lower lobe opacity, likely atelectasis, underlying pneumonia not excluded.  Minimal patchy opacities in the left upper lobe and posterior right upper lobe (series 3/image 24), possibly infectious.  Mild interstitial edema.  Underlying centrilobular emphysematous changes.  No pneumothorax.  Visualized thyroid is unremarkable.  The heart is top normal in size.  No pericardial effusion. Coronary atherosclerosis.  There is no calcification of the aortic arch.  No suspicious mediastinal or axillary lymphadenopathy.  Visualized osseous structures are within normal limits.  IMPRESSION: Mild interstitial edema with a moderate left pleural effusion.  Associated left lower lobe opacity, likely  atelectasis, underlying pneumonia not excluded.  Minimal patchy opacities in the left upper lobe and posterior right upper lobe, possibly infectious.  CT ABDOMEN AND PELVIS  Findings:  Small hiatal hernia.  Unenhanced liver, spleen, pancreas, and adrenal glands are within normal limits.  Layering small gallstones.  No associated inflammatory changes.  Severe right renal atrophy, chronic.  Left kidney is unremarkable. No hydronephrosis.  No evidence of bowel obstruction.  Colonic diverticulosis, without associated inflammatory changes.  Pericolonic stranding involving the distal sigmoid colon (series  2/image 110), worrisome for colitis.  Extensive atherosclerotic calcifications of the abdominal aorta and branch vessels.  No abdominopelvic ascites.  No suspicious abdominopelvic lymphadenopathy.  Uterus and bilateral ovaries are unremarkable.  Bladder is decompressed by indwelling Foley catheter.  Mild degenerative changes of the lumbar spine.  IMPRESSION: Pericolonic stranding involving the distal sigmoid colon, worrisome for colitis.  Cholelithiasis, without associated inflammatory changes.  Severe right renal atrophy, chronic.  Original Report Authenticated By: Charline Bills, M.D.   Ct Chest Wo Contrast  10/22/2011  *RADIOLOGY REPORT*  Clinical Data:  Leukocytosis, acute on chronic renal failure, respiratory failure, recent pneumonia  CT CHEST, ABDOMEN AND PELVIS WITHOUT CONTRAST  Technique:  Multidetector CT imaging of the chest, abdomen and pelvis was performed following the standard protocol without IV contrast.  Comparison:  [place] CT chest dated 09/22/2011, [facility] CT chest/abdomen/pelvis dated 10/19/2007  CT CHEST  Findings:  Moderate left pleural effusion.  Associated left lower lobe opacity, likely atelectasis, underlying pneumonia not excluded.  Minimal patchy opacities in the left upper lobe and posterior right upper lobe (series 3/image 24), possibly infectious.  Mild interstitial edema.   Underlying centrilobular emphysematous changes.  No pneumothorax.  Visualized thyroid is unremarkable.  The heart is top normal in size.  No pericardial effusion. Coronary atherosclerosis.  There is no calcification of the aortic arch.  No suspicious mediastinal or axillary lymphadenopathy.  Visualized osseous structures are within normal limits.  IMPRESSION: Mild interstitial edema with a moderate left pleural effusion.  Associated left lower lobe opacity, likely atelectasis, underlying pneumonia not excluded.  Minimal patchy opacities in the left upper lobe and posterior right upper lobe, possibly infectious.  CT ABDOMEN AND PELVIS  Findings:  Small hiatal hernia.  Unenhanced liver, spleen, pancreas, and adrenal glands are within normal limits.  Layering small gallstones.  No associated inflammatory changes.  Severe right renal atrophy, chronic.  Left kidney is unremarkable. No hydronephrosis.  No evidence of bowel obstruction.  Colonic diverticulosis, without associated inflammatory changes.  Pericolonic stranding involving the distal sigmoid colon (series 2/image 110), worrisome for colitis.  Extensive atherosclerotic calcifications of the abdominal aorta and branch vessels.  No abdominopelvic ascites.  No suspicious abdominopelvic lymphadenopathy.  Uterus and bilateral ovaries are unremarkable.  Bladder is decompressed by indwelling Foley catheter.  Mild degenerative changes of the lumbar spine.  IMPRESSION: Pericolonic stranding involving the distal sigmoid colon, worrisome for colitis.  Cholelithiasis, without associated inflammatory changes.  Severe right renal atrophy, chronic.  Original Report Authenticated By: Charline Bills, M.D.   US Renal  10/13/2011  *RADIOLOGY REPORT*  Clinical Data: Renal failure.  RENAL/URINARY TRACT ULTRASOUND COMPLETE  Comparison:  Ultrasound dated 09/19/2007 performed at [facility].  Findings:  Right Kidney:  The right kidney is atrophic, measuring 5.3 cm in length.   No hydronephrosis.  Renal cortex is markedly thinned.  Left Kidney:  Left kidney is hypertrophic with no hydronephrosis or mass.  Slight increased echogenicity of the renal parenchyma consistent with renal medical disease.   13.9 cm in length.  Bladder:  The bladder is empty.  Foley catheter in place.  IMPRESSION: Since the prior exam the right kidney is more atrophic and the left kidney is more hypertrophic.  The slight increase echogenicity of the renal parenchyma on the left is new since the prior study.  Original Report Authenticated By: Gwynn Burly, M.D.   Dg Chest Port 1 View  10/23/2011  *RADIOLOGY REPORT*  Clinical Data: Diatek catheter insertion.  PORTABLE CHEST - 1 VIEW  Comparison: Chest x-ray 12/22/2010.  Findings: The left IJ catheter is stable.  The right IJ diatek catheter is in good position.  The distal port is in the right atrium and the proximal port is near the cavoatrial junction.  No pneumothorax or hematoma.  The heart and lungs are unchanged since earlier film.  IMPRESSION: Right IJ diatek catheter in good position without complicating features.  Original Report Authenticated By: P. Loralie Champagne, M.D.   Dg Chest Port 1 View  10/22/2011  *RADIOLOGY REPORT*  Clinical Data: Pneumonia, follow-up  PORTABLE CHEST - 1 VIEW  Comparison: Portable chest x-ray of 10/19/2011  Findings: Opacity remains at the left lung base most consistent with effusion and atelectasis.  Pneumonia cannot be excluded.  The right lung is clear.  Bilateral IJ central venous lines are unchanged in position.  The heart is borderline enlarged.  IMPRESSION: No change in opacity primarily at the left lung base consistent with effusion, atelectasis, and possibly pneumonia.  Original Report Authenticated By: Juline Patch, M.D.   Dg Chest Port 1 View  10/19/2011  *RADIOLOGY REPORT*  Clinical Data: Shortness of breath.  PORTABLE CHEST - 1 VIEW  Comparison: 10/17/2011.  Findings: Right central line tip proximal  superior vena cava level. Left central line tip proximal superior vena cava level directed laterally.  No gross pneumothorax.  Asymmetric air space disease greatest on the left and most notable in the left lung base may represent infectious infiltrate with atelectasis/consolidation left base.  Pulmonary edema not excluded.  Calcified aorta.  Cardiomegaly.  IMPRESSION: No significant change.  Original Report Authenticated By: Fuller Canada, M.D.   Dg Chest Port 1 View  10/17/2011  *RADIOLOGY REPORT*  Clinical Data: Central venous line placement  PORTABLE CHEST - 1 VIEW  Comparison: Portable chest x-ray of 10/17/2011  Findings: There is haziness throughout the left hemithorax most consistent with left effusion with basilar atelectasis present bilaterally.  Slightly more opacification remains at the left lung base.  A left IJ central venous line remains with the tip in the mid upper SVC.  A new right IJ central venous line is present with the tip at the same location within the mid upper SVC.  No pneumothorax is seen.  Heart size is stable.  IMPRESSION:  1.  New right IJ central venous line tip in mid upper SVC.  No pneumothorax. 2.  Little change in left effusion and persistent opacity at the left lung base.  Original Report Authenticated By: Juline Patch, M.D.   Dg Chest Port 1 View  10/17/2011  *RADIOLOGY REPORT*  Clinical Data: Respiratory failure.  PORTABLE CHEST - 1 VIEW  Comparison: 10/16/2011.  Findings: Left IJ central line tip projects over the SVC.  Heart size normal.  Layering left pleural effusion and bibasilar air space disease persist, left greater than right.  IMPRESSION: Layering left pleural effusion and bibasilar air space disease, left greater than right, persist.  Original Report Authenticated By: Reyes Ivan, M.D.   Dg Chest Port 1 View  10/16/2011  *RADIOLOGY REPORT*  Clinical Data: Respiratory failure.  PORTABLE CHEST - 1 VIEW  Comparison: 10/14/2011.  Findings: Left IJ  central line tip projects over the SVC.  Heart size stable.  Thoracic aorta is calcified.  There is a moderate left pleural effusion.  Mixed interstitial and airspace disease has improved slightly in the interval. Left lower lobe collapse/consolidation.  IMPRESSION: Slight interval improvement in mixed interstitial and airspace disease, which may be due to edema.  Moderate left pleural effusion.  Left lower lobe collapse/consolidation.  Original Report Authenticated By: Reyes Ivan, M.D.   Dg Chest Port 1 View  10/14/2011  *RADIOLOGY REPORT*  Clinical Data: Respiratory distress  PORTABLE CHEST - 1 VIEW  Comparison: Multiple recent previous exams.  Findings: 2206 hours. The cardiopericardial silhouette is enlarged. Left greater than right interstitial and basilar airspace disease persist.  Probable small bilateral pleural effusions.  Right IJ central line tip is stable, positioned the proximal SVC level. Telemetry leads overlie the chest.  IMPRESSION: Stable exam.  Cardiomegaly with asymmetric left greater than right interstitial and basilar airspace opacities.  Original Report Authenticated By: ERIC A. MANSELL, M.D.   Dg Chest Port 1 View  10/14/2011  *RADIOLOGY REPORT*  Clinical Data: Follow up edema and pleural effusion  PORTABLE CHEST - 1 VIEW  Comparison: 10/12/2011  Findings: Cardiomediastinal silhouette is stable.  Asymmetric mild interstitial edema left greater than right again noted without change in aeration.  Probable small bilateral pleural effusion again noted.  Stable left IJ central line position.  No diagnostic pneumothorax.  IMPRESSION: Asymmetric mild interstitial edema left greater than right again noted without change in aeration.  Probable small bilateral pleural effusion again noted.  Stable left IJ central line position.  Original Report Authenticated By: Natasha Mead, M.D.   Portable Chest 1 View  10/12/2011  *RADIOLOGY REPORT*  Clinical Data: Shortness of breath.  PORTABLE CHEST  - 1 VIEW 10/12/2011 0006 hours:  Comparison: Portable chest x-ray yesterday, 09/25/2011, and two- view chest x-ray 09/24/2011 [facility].  Findings: Left jugular central venous catheter tip in the upper SVC.  No evidence of pneumothorax mediastinal hematoma.  Cardiac silhouette enlarged.  Pulmonary venous hypertension and mild asymmetric interstitial pulmonary edema, left greater than right. Small bilateral pleural effusions, left greater than right.  Dense consolidation in the left lower lobe, unchanged.  IMPRESSION: New asymmetric mild interstitial pulmonary edema, left greater than right.  Stable bilateral pleural effusions, left greater than right.  Stable dense left lower lobe atelectasis and/or pneumonia.  Original Report Authenticated By: Arnell Sieving, M.D.   Dg Fluoro Guide Cv Line-no Report  10/23/2011  CLINICAL DATA: dialysis catherter insertion   FLOURO GUIDE CV LINE  Fluoroscopy was utilized by the requesting physician.  No radiographic  interpretation.      MEDICATIONS: Scheduled Meds:   . ipratropium  0.5 mg Nebulization TID   And  . albuterol  2.5 mg Nebulization TID  . aspirin  81 mg Oral Daily  . calcium acetate  667 mg Oral TID WC  . clopidogrel  75 mg Oral Q breakfast  . darbepoetin (ARANESP) injection - DIALYSIS  200 mcg Intravenous Q Tue-HD  . docusate sodium  100 mg Oral BID  . enoxaparin (LOVENOX) injection  55 mg Subcutaneous Daily  . famotidine  20 mg Oral Daily  . ferric glucontate (NULECIT) IV  125 mg Intravenous Q M,W,F-HD  . guaiFENesin  600 mg Oral BID  . insulin aspart  0-20 Units Subcutaneous TID WC  . insulin aspart  0-5 Units Subcutaneous QHS  . insulin glargine  14 Units Subcutaneous QHS  . isosorbide mononitrate  120 mg Oral Daily  . levothyroxine  25 mcg Oral Q0600  . multivitamin  1 tablet Oral QHS  . polyethylene glycol  17 g Oral Daily  . sodium chloride      . vancomycin  125 mg Oral QID  . DISCONTD: imipenem-cilastatin  250 mg  Intravenous Q12H  .  DISCONTD: warfarin  1 mg Oral ONCE-1800   Continuous Infusions:  PRN Meds:.sodium chloride, sodium chloride, sodium chloride, acetaminophen, acetaminophen, ALPRAZolam, alteplase, calcium carbonate (dosed in mg elemental calcium), docusate sodium, feeding supplement (NEPRO CARB STEADY), heparin, heparin, heparin, lidocaine, lidocaine-prilocaine, morphine injection, nitroGLYCERIN, ondansetron (ZOFRAN) IV, ondansetron (ZOFRAN) IV, ondansetron, ondansetron, oxyCODONE, pentafluoroprop-tetrafluoroeth, zolpidem  Antibiotics: Anti-infectives     Start     Dose/Rate Route Frequency Ordered Stop   10/23/11 1100   vancomycin (VANCOCIN) 50 mg/mL oral solution 125 mg        125 mg Oral 4 times daily 10/23/11 1059 11/06/11 0959   10/22/11 1400   vancomycin (VANCOCIN) 50 mg/mL oral solution 125 mg  Status:  Discontinued        125 mg Oral 4 times daily 10/22/11 1219 10/22/11 1726   10/15/11 1000   imipenem-cilastatin (PRIMAXIN) 250 mg in sodium chloride 0.9 % 100 mL IVPB  Status:  Discontinued        250 mg 200 mL/hr over 30 Minutes Intravenous Every 12 hours 10/15/11 0022 10/25/11 1537   10/15/11 0030   imipenem-cilastatin (PRIMAXIN) 250 mg in sodium chloride 0.9 % 100 mL IVPB        250 mg 200 mL/hr over 30 Minutes Intravenous NOW 10/15/11 0022 10/15/11 0203   10/14/11 1800   ciprofloxacin (CIPRO) tablet 500 mg  Status:  Discontinued        500 mg Oral Daily 10/14/11 1623 10/14/11 2333   10/11/11 2359   moxifloxacin (AVELOX) IVPB 400 mg  Status:  Discontinued        400 mg 250 mL/hr over 60 Minutes Intravenous Every 24 hours 10/11/11 2306 10/13/11 1700          Assessment/Plan: Patient Active Hospital Problem List: ARF (acute respiratory failure)  Assessment: Better. Tolerating oxygen by nasal cannula now. Etiology is multifactorial with pulmonary edema, presumed pulmonary embolism and possible pneumonia.    Plan: Would continue with oxygen as tolerated.   CKD (chronic  kidney disease) stage 5,    Assessment: Now with end-stage renal disease.    Plan: Hemodialysis per renal.   DM (diabetes mellitus)    Assessment: CBGs stable.    Plan: Continue with Lantus and sliding scale insulin.    DVT with presumed pulmonary embolism   Assessment: Stable    Plan: Continue with Lovenox, need to clarify whether permacath accesses planned this admission-otherwise will need to restart Coumadin.   HTN (hypertension)    Assessment: Controlled    Plan: Continue with Imdur.   Anemia    Assessment: Likely secondary to critical illness and end-stage renal disease.    Plan: Continue to monitor for now, on darbepoetin and intravenous iron. We'll need to transfuse if hemoglobin drops less than 7.    Diarrhea-presumed C. difficile colitis   Assessment: Unchanged   Plan: Continue with empiric oral  Vancomycin.   ESBL ESCHERICHIA coli UTI  Assessment: Afebrile, completed antibiotic course  Plan: Monitor off antibiotics.  Hypothyroidism  Assessment: Stable, TSH within normal limits.  Plan: Continue with levothyroxine.   Cardiac enzymes elevated    Assessment: Had elevated cardiac enzymes this admission. Seen by cardiology-no further workup recommended given good LV function.    Plan: Continue with antiplatelet agents.    COPD   Assessment: Stable   Plan: Continue with albuterol and Atrovent nebs.  Deconditioning   Assessment: Secondary to prolonged hospitalization and critical illness.   Plan: Obtain PT and OT eval  Disposition: Remain inpatient  DVT Prophylaxis:  on therapeutic Lovenox   Code Status:  limited code   Maretta Bees,  MD. 10/26/2011, 1:34 PM

## 2011-10-26 NOTE — Progress Notes (Signed)
Subjective: Interval History: none. 1 bm today  Objective: Vital signs in last 24 hours: Temp:  [97.8 F (36.6 C)-99 F (37.2 C)] 98 F (36.7 C) (12/23 1015) Pulse Rate:  [70-120] 120  (12/23 1015) Resp:  [18-27] 20  (12/23 1015) BP: (96-159)/(50-77) 159/77 mmHg (12/23 1015) SpO2:  [90 %-100 %] 94 % (12/23 1015) Weight:  [54.3 kg (119 lb 11.4 oz)-63.9 kg (140 lb 14 oz)] 119 lb 11.4 oz (54.3 kg) (12/22 2210) Weight change: -1.3 kg (-2 lb 13.9 oz)  Intake/Output from previous day: 12/22 0701 - 12/23 0700 In: 580 [P.O.:480; IV Piggyback:100] Out: 1000  Intake/Output this shift: Total I/O In: 240 [P.O.:240] Out: -   General appearance: alert, cooperative and pale Resp: diminished breath sounds bilaterally Chest wall: no tenderness, PC on RIJ Cardio: regular rate and rhythm, S1, S2 normal and systolic murmur: systolic ejection 2/6, decrescendo at 2nd left intercostal space GI: soft non tender, pos BS, liver down 4 cm  Lab Results:  Basename 10/26/11 0615 10/25/11 0700  WBC 18.0* 19.8*  HGB 7.1* 7.2*  HCT 22.5* 23.3*  PLT 368 355   BMET:  Basename 10/25/11 0810 10/24/11 0430  NA 138 138  K 3.5 3.8  CL 99 97  CO2 27 28  GLUCOSE 102* 137*  BUN 25* 29*  CREATININE 2.45* 2.54*  CALCIUM 9.2 9.0   No results found for this basename: PTH:2 in the last 72 hours Iron Studies: No results found for this basename: IRON,TIBC,TRANSFERRIN,FERRITIN in the last 72 hours  Studies/Results: No results found.  I have reviewed the patient's current medications.  Assessment/Plan: 1 ESRD needs perm access.  Solute/ acid base ok.  2 DVT stable hep 3 Anemia a little worse, follow 4 Pneu resolved\ 5 Debill 7 C diff resolving P Hd, EPO, Hep, perm access    LOS: 15 days   Castin Donaghue L 10/26/2011,11:47 AM

## 2011-10-26 NOTE — Progress Notes (Addendum)
ANTIBIOTIC CONSULT NOTE - FOLLOW UP  Pharmacy Consult:  Lovenox Indication: DVT  Allergies  Allergen Reactions  . Novocain Other (See Comments)    Unknown    Patient Measurements: Height: 5\' 5"  (165.1 cm) Weight: 119 lb 11.4 oz (54.3 kg) IBW/kg (Calculated) : 57    Vital Signs: Temp: 98.5 F (36.9 C) (12/23 0630) Temp src: Oral (12/23 0630) BP: 158/75 mmHg (12/23 0630) Pulse Rate: 103  (12/23 0630) Intake/Output from previous day: 12/22 0701 - 12/23 0700 In: 580 [P.O.:480; IV Piggyback:100] Out: 1000     Labs:  [facility] 10/26/11 0615 10/25/11 0810 10/25/11 0700 10/24/11 0439 10/24/11 0430  WBC 18.0* -- 19.8* 21.5* --  HGB 7.1* -- 7.2* 7.7* --  PLT 368 -- 355 382 --  LABCREA -- -- -- -- --  CREATININE -- 2.45* -- -- 2.54*   Estimated Creatinine Clearance: 18.8 ml/min (by C-G formula based on Cr of 2.45). No results found for this basename: VANCOTROUGH:2,VANCOPEAK:2,VANCORANDOM:2,GENTTROUGH:2,GENTPEAK:2,GENTRANDOM:2,TOBRATROUGH:2,TOBRAPEAK:2,TOBRARND:2,AMIKACINPEAK:2,AMIKACINTROU:2,AMIKACIN:2, in the last 72 hours   Microbiology: Recent Results (from the past 720 hour(s))  MRSA PCR SCREENING     Status: Normal   Collection Time   10/11/11  9:25 PM      Component Value Range Status Comment   MRSA by PCR NEGATIVE  NEGATIVE  Final   URINE CULTURE     Status: Normal   Collection Time   10/12/11  3:18 PM      Component Value Range Status Comment   Specimen Description URINE, CATHETERIZED   Final    Special Requests NONE   Final    Setup Time [id number]   Final    Colony Count >=100,000 COLONIES/ML   Final    Culture     Final    Value: ESCHERICHIA COLI     Note: Confirmed Extended Spectrum Beta-Lactamase Producer (ESBL)     Note: CRITICAL RESULT CALLED TO, READ BACK BY AND VERIFIED WITH: LACEY HITT 12/11 AT 2250 BY DUNNJ   Report Status 10/14/2011 FINAL   Final    Organism ID, Bacteria ESCHERICHIA COLI   Final   CULTURE, BLOOD (ROUTINE X 2)     Status: Normal  (Preliminary result)   Collection Time   10/22/11 12:45 PM      Component Value Range Status Comment   Specimen Description BLOOD RIGHT HAND   Final    Special Requests BOTTLES DRAWN AEROBIC AND ANAEROBIC 10CC   Final    Setup Time [id number]   Final    Culture     Final    Value:        BLOOD CULTURE RECEIVED NO GROWTH TO DATE CULTURE WILL BE HELD FOR 5 DAYS BEFORE ISSUING A FINAL NEGATIVE REPORT   Report Status PENDING   Incomplete   CULTURE, BLOOD (ROUTINE X 2)     Status: Normal (Preliminary result)   Collection Time   10/22/11 12:55 PM      Component Value Range Status Comment   Specimen Description BLOOD RIGHT WRIST   Final    Special Requests BOTTLES DRAWN AEROBIC ONLY 5CC   Final    Setup Time [id number]   Final    Culture     Final    Value:        BLOOD CULTURE RECEIVED NO GROWTH TO DATE CULTURE WILL BE HELD FOR 5 DAYS BEFORE ISSUING A FINAL NEGATIVE REPORT   Report Status PENDING   Incomplete   URINE CULTURE     Status: Normal   Collection  Time   10/22/11  3:31 PM      Component Value Range Status Comment   Specimen Description URINE, CATHETERIZED   Final    Special Requests NONE   Final    Setup Time [id number]   Final    Colony Count NO GROWTH   Final    Culture NO GROWTH   Final    Report Status 10/23/2011 FINAL   Final     Anti-infectives     Start     Dose/Rate Route Frequency Ordered Stop   10/23/11 1100   vancomycin (VANCOCIN) 50 mg/mL oral solution 125 mg        125 mg Oral 4 times daily 10/23/11 1059 11/06/11 0959   10/22/11 1400   vancomycin (VANCOCIN) 50 mg/mL oral solution 125 mg  Status:  Discontinued        125 mg Oral 4 times daily 10/22/11 1219 10/22/11 1726   10/15/11 1000   imipenem-cilastatin (PRIMAXIN) 250 mg in sodium chloride 0.9 % 100 mL IVPB  Status:  Discontinued        250 mg 200 mL/hr over 30 Minutes Intravenous Every 12 hours 10/15/11 0022 10/25/11 1537   10/15/11 0030   imipenem-cilastatin (PRIMAXIN) 250 mg in sodium chloride  0.9 % 100 mL IVPB        250 mg 200 mL/hr over 30 Minutes Intravenous NOW 10/15/11 0022 10/15/11 0203   10/14/11 1800   ciprofloxacin (CIPRO) tablet 500 mg  Status:  Discontinued        500 mg Oral Daily 10/14/11 1623 10/14/11 2333   10/11/11 2359   moxifloxacin (AVELOX) IVPB 400 mg  Status:  Discontinued        400 mg 250 mL/hr over 60 Minutes Intravenous Every 24 hours 10/11/11 2306 10/13/11 1700          Assessment: Anticoagulation: RLE DVT, INR down 2.93 post dose held yesterday.  No documentation of continued nosebleed.  Per MD, patient to get permanent dialysis access; therefore, will stop Coumadin and start Lovenox for anticoagulation.  MD aware pt on HD.  Infectious Disease: Day#3 Vanc 125 mg PO qid for C diff infection. WBC down 18, afebrile, bowel movements still loose per note.  S/p Primaxin for E.coli UTI. Cardiovascular: HTN/CAD: 158/75, HR 103 - on home ASA, Plavix, Imdur Endocrinology: Levothyroxine per home dose. Gastrointestinal / Nutrition: colitis, CBGs well controlled on Lantus + Novolog, c/o nausea today Nephrology: ESRD on HD TTS - on Phoslo, Renavite Pulmonary: chest CT 12/19 notable for ?opacities and pleural effusion, 96% 3L , on guaifenesin, atrovent/albuterol Hematology / Oncology: hgb 7.1, plts 368 - Aranesp, IV iron  Best Practices: Coumadin, Pepcid, home meds addressed.  Goal of Therapy:  INR 2-3  Plan:  - D/C Coumadin - Lovenox 55mg  SQ daily for CrCL < 30 ml/min - CBC Q72H   Phillips Climes Dien 10/26/2011,9:49 AM

## 2011-10-27 ENCOUNTER — Inpatient Hospital Stay (HOSPITAL_COMMUNITY): Payer: Medicare Other

## 2011-10-27 LAB — CBC
HCT: 23.6 % — ABNORMAL LOW (ref 36.0–46.0)
MCH: 26.8 pg (ref 26.0–34.0)
MCHC: 30.9 g/dL (ref 30.0–36.0)
RDW: 20.2 % — ABNORMAL HIGH (ref 11.5–15.5)

## 2011-10-27 LAB — GLUCOSE, CAPILLARY
Glucose-Capillary: 154 mg/dL — ABNORMAL HIGH (ref 70–99)
Glucose-Capillary: 226 mg/dL — ABNORMAL HIGH (ref 70–99)

## 2011-10-27 LAB — COMPREHENSIVE METABOLIC PANEL
Albumin: 2.6 g/dL — ABNORMAL LOW (ref 3.5–5.2)
Alkaline Phosphatase: 120 U/L — ABNORMAL HIGH (ref 39–117)
BUN: 31 mg/dL — ABNORMAL HIGH (ref 6–23)
Calcium: 9.1 mg/dL (ref 8.4–10.5)
GFR calc Af Amer: 14 mL/min — ABNORMAL LOW (ref >90)
Glucose, Bld: 303 mg/dL — ABNORMAL HIGH (ref 70–99)
Potassium: 3.5 mEq/L (ref 3.5–5.1)
Total Protein: 6.7 g/dL (ref 6.0–8.3)

## 2011-10-27 LAB — PHOSPHORUS: Phosphorus: 2.3 mg/dL (ref 2.3–4.6)

## 2011-10-27 LAB — PROTIME-INR
INR: 2.07 — ABNORMAL HIGH (ref 0.00–1.49)
Prothrombin Time: 23.7 seconds — ABNORMAL HIGH (ref 11.6–15.2)

## 2011-10-27 NOTE — Progress Notes (Signed)
Physical Therapy Treatment Patient Details Name: [name] [name] MRN: [id number] DOB: June 28, 1941 Today's Date: 10/27/2011  PT Assessment/Plan  PT - Assessment/Plan Comments on Treatment Session: increased activity tolerance today and less assit needed for mobility. Pt still with low Hbg (7.1 today). No symptoms with mobility. PT Plan: Discharge plan remains appropriate;Other (comment) (if pt continues to progress may need updated d/c and frequen) PT Frequency: Min 2X/week Follow Up Recommendations: Skilled nursing facility Equipment Recommended: Defer to next venue PT Goals  Acute Rehab PT Goals PT Goal: Supine/Side to Sit - Progress: Progressing toward goal PT Goal: Sit to Supine/Side - Progress: Progressing toward goal PT Goal: Sit to Stand - Progress: Met PT Goal: Stand to Sit - Progress: Met PT Transfer Goal: Bed to Chair/Chair to Bed - Progress: Met PT Goal: Ambulate - Progress: Progressing toward goal  PT Treatment Precautions/Restrictions  Precautions Precautions: Fall Required Braces or Orthoses: No Restrictions Weight Bearing Restrictions: No Other Position/Activity Restrictions: vitals stable with mobility. On 2lpm O2 via [place]. No shortness of breath or desating with mobility today. Mobility (including Balance) Bed Mobility Supine to Sit: 4: Min assist;HOB elevated (Comment degrees) Supine to Sit Details (indicate cue type and reason): with HOB 30 degrees. min assist to transition trunk into sitting at EOB. pt able to self move legs off edge of bed. cues to use arms to elevate trunk into sitting. Sitting - Scoot to Edge of Bed: 5: Supervision Sitting - Scoot to [place] of Bed Details (indicate cue type and reason): no balance issues with sitting and scooting to edge of bed. cues to use arms to push hips forward with ant wt shift to assit with trunk/pelvic movements Scooting to [facility]: 3: Mod assist Scooting to [facility] Details (indicate cue type and reason): pt able to use arms to pull on  rails and legs to push trunk up toward head of bed. assist with pad under pt to move trunk/hips. scooted up in bed priro to sitting up to allow room at foot of bed for leg/foot movement. Transfers Sit to Stand: 4: Min assist;From bed;With upper extremity assist Sit to Stand Details (indicate cue type and reason): cues to push with hands and to use "rocking" to gain momentum. cues to "bare down" on legs with standing. min assist to raise hips/trunk of bed surface, then min guard assit while pt achieved full standing. no buckling with standing ex's prior to transfer to chair Stand to Sit: 4: Min assist;With upper extremity assist;To chair/3-in-1 Stand to Sit Details: min guard assist. cues to use arms to control descent into chair. Stand Pivot Transfers: 4: Min assist Stand Pivot Transfer Details (indicate cue type and reason): min assist for balance. pt able to take 5 steps bed to chair with no knee buckling. does demo decreased DF with steps. cues to increase DF for increased foot clearance.  Posture/Postural Control Posture/Postural Control: No significant limitations Exercise  General Exercises - Lower Extremity Ankle Circles/Pumps: AROM;Both;10 reps;Supine Quad Sets: AROM;Both;10 reps;Supine Hip Flexion/Marching: AROM;10 reps;Both;Seated Toe Raises: AROM;Both;10 reps;Seated Heel Raises: AROM;Both;10 reps;Seated Mini-Sqauts: AROM;5 reps;Standing End of Session PT - End of Session Equipment Utilized During Treatment: Gait belt Activity Tolerance: Patient tolerated treatment well Patient left: in chair;with call bell in reach Nurse Communication: Mobility status for transfers;Mobility status for ambulation;Need for lift equipment (pt on way to HD. May need increased assit for back to bed ) General Behavior During Session: [facility] for tasks performed Cognition: [facility] for tasks performed  [name] [name] 10/27/2011, 11:13 AM  Olegario Messier  Clearence Ped, [place] Office- [phone]

## 2011-10-27 NOTE — Progress Notes (Signed)
Patient ID: [name] [name], female   DOB: [birth year], 70 y.o.   MRN: [id number] I was present at this session.  I have reviewed the session itself and made appropriate changes.  Ramal Eckhardt L 12/24/20121:14 PM

## 2011-10-27 NOTE — Progress Notes (Signed)
ANTICOAGULATION CONSULT NOTE - Follow Up Consult  Pharmacy Consult for Lovenox Indication: DVT  Allergies  Allergen Reactions  . Novocain Other (See Comments)    Unknown    Patient Measurements: Height: 5\' 5"  (165.1 cm) Weight: 122 lb 5.7 oz (55.5 kg) IBW/kg (Calculated) : 57  Adjusted Body Weight:   Vital Signs: Temp: 97.8 F (36.6 C) (12/24 0606) Temp src: Oral (12/24 0606) BP: 171/76 mmHg (12/24 0606) Pulse Rate: 101  (12/24 0606)  Labs:  Alvira Philips 10/27/11 0537 10/26/11 0615 10/25/11 0810 10/25/11 0700  HGB -- 7.1* -- 7.2*  HCT -- 22.5* -- 23.3*  PLT -- 368 -- 355  APTT -- -- -- --  LABPROT 23.7* 31.0* -- 38.9*  INR 2.07* 2.93* -- 3.91*  HEPARINUNFRC -- <0.10* -- 0.95*  CREATININE -- -- 2.45* --  CKTOTAL -- -- -- --  CKMB -- -- -- --  TROPONINI -- -- -- --   Estimated Creatinine Clearance: 19.3 ml/min (by C-G formula based on Cr of 2.45).   Medications:  Scheduled:    . ipratropium  0.5 mg Nebulization TID   And  . albuterol  2.5 mg Nebulization TID  . aspirin  81 mg Oral Daily  . calcium acetate  667 mg Oral TID WC  . clopidogrel  75 mg Oral Q breakfast  . darbepoetin (ARANESP) injection - DIALYSIS  200 mcg Intravenous Q Tue-HD  . docusate sodium  100 mg Oral BID  . enoxaparin (LOVENOX) injection  55 mg Subcutaneous Daily  . famotidine  20 mg Oral Daily  . ferric glucontate (NULECIT) IV  125 mg Intravenous Q M,W,F-HD  . guaiFENesin  600 mg Oral BID  . insulin aspart  0-20 Units Subcutaneous TID WC  . insulin aspart  0-5 Units Subcutaneous QHS  . insulin glargine  14 Units Subcutaneous QHS  . isosorbide mononitrate  120 mg Oral Daily  . levothyroxine  25 mcg Oral Q0600  . multivitamin  1 tablet Oral QHS  . vancomycin  125 mg Oral QID  . DISCONTD: polyethylene glycol  17 g Oral Daily  . DISCONTD: warfarin  1 mg Oral ONCE-1800   Assessment:  Anticoagulation: RLE DVT, INR down 2.07 -warfarin on hold. No further nosebleed. Per MD, patient to get  permanent dialysis access; therefore, will stop Coumadin and start Lovenox for anticoagulation. MD aware pt on HD.  Infectious Disease: Day#4 Vanc 125 mg PO qid empiric for possible C diff infection., afebrile, bowel movements still loose per note. S/p Primaxin for E.coli UTI. Cardiovascular: HTN/CAD: 171/76, HR 100 - on home ASA, Plavix, Imdur Endocrinology: Levothyroxine per home dose.  Gastrointestinal / Nutrition: colitis, CBGs controlled on Lantus + Novolog, c/o nausea today Nephrology: ESRD on HD TTS - on Phoslo, Renavite Pulmonary: chest CT 12/19 notable for ?opacities and pleural effusion, on albuterol/ipratropium nebs. 96% 3L [place], on guaifenesin, atrovent/albuterol Hematology / Oncology: Aranesp, IV iron  Best Practices: Coumadin, Pepcid, home meds addressed.  Goal of Therapy:  Anti-Xa = 0.6-1.2   Plan:  1. Continue lovenox 55mg  SQ q24h and await order to start coumadin after line placement 2. CBC q72h  Dannielle Huh 10/27/2011,10:34 AM

## 2011-10-27 NOTE — Progress Notes (Signed)
Subjective: Interval History: has no complaint of has had no BM today.  Objective: Vital signs in last 24 hours: Temp:  [97.5 F (36.4 C)-98.1 F (36.7 C)] 97.8 F (36.6 C) (12/24 0606) Pulse Rate:  [101-122] 101  (12/24 0606) Resp:  [19-20] 19  (12/24 0606) BP: (102-171)/(54-81) 171/76 mmHg (12/24 0606) SpO2:  [84 %-100 %] 97 % (12/24 0606) Weight:  [55.5 kg (122 lb 5.7 oz)] 122 lb 5.7 oz (55.5 kg) (12/23 2049) Weight change: -9.7 kg (-21 lb 6.2 oz)  Intake/Output from previous day: 12/23 0701 - 12/24 0700 In: 960 [P.O.:960] Out: 450 [Urine:450] Intake/Output this shift: Total I/O In: 120 [P.O.:120] Out: -   General appearance: alert and pale Resp: clear to auscultation bilaterally Cardio: S1, S2 normal and systolic murmur: systolic ejection 2/6, decrescendo at 2nd left intercostal space GI: liver down 3 cm  Lab Results:  Basename 10/26/11 0615 10/25/11 0700  WBC 18.0* 19.8*  HGB 7.1* 7.2*  HCT 22.5* 23.3*  PLT 368 355   BMET:  Basename 10/25/11 0810  NA 138  K 3.5  CL 99  CO2 27  GLUCOSE 102*  BUN 25*  CREATININE 2.45*  CALCIUM 9.2   No results found for this basename: PTH:2 in the last 72 hours Iron Studies: No results found for this basename: IRON,TIBC,TRANSFERRIN,FERRITIN in the last 72 hours  Studies/Results: No results found.  I have reviewed the patient's current medications.  Assessment/Plan: 1 CRF for HD, awaiting perm access.   2 DVT enox, coumadin in future 3 anemia follow closely epo/fe 4 HPTH 5 ^ wbc, recheck 6 C.diff resovled  P HD, EPO/fe, follow wbc    LOS: 16 days   Ninetta Adelstein L 10/27/2011,9:42 AM

## 2011-10-27 NOTE — Progress Notes (Signed)
OT Note:  OT eval canceled today.  Pt receiving HD currently.  Will continue to follow. 10/27/2011 Martie Round, OTR/L Pager: [phone]

## 2011-10-27 NOTE — Progress Notes (Signed)
PATIENT DETAILS Name: [name] [name] Age: 70 y.o. Sex: female Date of Birth: 1941-08-23 Admit Date: 10/11/2011 PCP:No primary provider on file.  Subjective: Seen and examined, has no major complaints today.Claims diarrhea getting better.  Objective: Vital signs in last 24 hours: Filed Vitals:   10/27/11 1334 10/27/11 1400 10/27/11 1430 10/27/11 1500  BP: 99/53 119/60 103/64 133/66  Pulse: 104 106 106 108  Temp:    97.7 F (36.5 C)  TempSrc:    Oral  Resp: 20 20 20 20   Height:      Weight:    63.4 kg (139 lb 12.4 oz)  SpO2:    95%    Weight change: -9.7 kg (-21 lb 6.2 oz)  Body mass index is 23.26 kg/(m^2).  Intake/Output from previous day:  Intake/Output Summary (Last 24 hours) at 10/27/11 1603 Last data filed at 10/27/11 1500  Gross per 24 hour  Intake    600 ml  Output   1200 ml  Net   -600 ml    PHYSICAL EXAM: Gen Exam: Awake and alert with clear speech.   Neck: Supple, No JVD.   Chest: B/L Clear.  No added sounds CVS: S1 S2 Regular, no murmurs.  Abdomen: soft, BS +, non tender, non distended.  Extremities: no edema, lower extremities warm to touch. Neurologic: Non Focal.   Skin: No Rash.   Wounds: N/A.    CONSULTS:  nephrology  LAB RESULTS: CBC  Lab 10/27/11 1127 10/26/11 0615 10/25/11 0700 10/24/11 0439 10/23/11 0430 10/21/11 0350  WBC 17.4* 18.0* 19.8* 21.5* 24.7* --  HGB 7.3* 7.1* 7.2* 7.7* 7.4* --  HCT 23.6* 22.5* 23.3* 24.6* 22.9* --  PLT 364 368 355 382 375 --  MCV 86.8 86.2 85.7 82.8 82.1 --  MCH 26.8 27.2 26.5 25.9* 26.5 --  MCHC 30.9 31.6 30.9 31.3 32.3 --  RDW 20.2* 18.4* 18.1* 17.7* 17.3* --  LYMPHSABS -- -- -- -- -- 1.9  MONOABS -- -- -- -- -- 1.5*  EOSABS -- -- -- -- -- 0.4  BASOSABS -- -- -- -- -- 0.2*  BANDABS -- -- -- -- -- --    Chemistries   Lab 10/27/11 1127 10/25/11 0810 10/24/11 0430 10/23/11 0430 10/22/11 0430  NA 129* 138 138 135 135  K 3.5 3.5 3.8 3.6 4.2  CL 89* 99 97 93* 94*  CO2 27 27 28 27 26   GLUCOSE 303* 102*  137* 56* 155*  BUN 31* 25* 29* 68* 48*  CREATININE 3.63* 2.45* 2.54* 4.44* 3.69*  CALCIUM 9.1 9.2 9.0 9.4 8.9  MG -- -- -- -- --    GFR Estimated Creatinine Clearance: 13.3 ml/min (by C-G formula based on Cr of 3.63).  Coagulation profile  Lab 10/27/11 0537 10/26/11 0615 10/25/11 0700 10/24/11 0439 10/23/11 0430  INR 2.07* 2.93* 3.91* 1.76* 1.42  PROTIME -- -- -- -- --    Cardiac Enzymes No results found for this basename: CK:3,CKMB:3,TROPONINI:3,MYOGLOBIN:3 in the last 168 hours  No components found with this basename: POCBNP:3 No results found for this basename: DDIMER:2 in the last 72 hours No results found for this basename: HGBA1C:2 in the last 72 hours No results found for this basename: CHOL:2,HDL:2,LDLCALC:2,TRIG:2,CHOLHDL:2,LDLDIRECT:2 in the last 72 hours No results found for this basename: TSH,T4TOTAL,FREET3,T3FREE,THYROIDAB in the last 72 hours No results found for this basename: VITAMINB12:2,FOLATE:2,FERRITIN:2,TIBC:2,IRON:2,RETICCTPCT:2 in the last 72 hours No results found for this basename: LIPASE:2,AMYLASE:2 in the last 72 hours  Urine Studies No results found for this basename: UACOL:2,UAPR:2,USPG:2,UPH:2,UTP:2,UGL:2,UKET:2,UBIL:2,UHGB:2,UNIT:2,UROB:2,ULEU:2,UEPI:2,UWBC:2,URBC:2,UBAC:2,CAST:2,CRYS:2,UCOM:2,BILUA:2 in  the last 72 hours  MICROBIOLOGY: Recent Results (from the past 240 hour(s))  CULTURE, BLOOD (ROUTINE X 2)     Status: Normal (Preliminary result)   Collection Time   10/22/11 12:45 PM      Component Value Range Status Comment   Specimen Description BLOOD RIGHT HAND   Final    Special Requests BOTTLES DRAWN AEROBIC AND ANAEROBIC 10CC   Final    Setup Time [id number]   Final    Culture     Final    Value:        BLOOD CULTURE RECEIVED NO GROWTH TO DATE CULTURE WILL BE HELD FOR 5 DAYS BEFORE ISSUING A FINAL NEGATIVE REPORT   Report Status PENDING   Incomplete   CULTURE, BLOOD (ROUTINE X 2)     Status: Normal (Preliminary result)   Collection  Time   10/22/11 12:55 PM      Component Value Range Status Comment   Specimen Description BLOOD RIGHT WRIST   Final    Special Requests BOTTLES DRAWN AEROBIC ONLY 5CC   Final    Setup Time [id number]   Final    Culture     Final    Value:        BLOOD CULTURE RECEIVED NO GROWTH TO DATE CULTURE WILL BE HELD FOR 5 DAYS BEFORE ISSUING A FINAL NEGATIVE REPORT   Report Status PENDING   Incomplete   URINE CULTURE     Status: Normal   Collection Time   10/22/11  3:31 PM      Component Value Range Status Comment   Specimen Description URINE, CATHETERIZED   Final    Special Requests NONE   Final    Setup Time [id number]   Final    Colony Count NO GROWTH   Final    Culture NO GROWTH   Final    Report Status 10/23/2011 FINAL   Final     RADIOLOGY STUDIES/RESULTS: Ct Abdomen Pelvis Wo Contrast  10/22/2011  *RADIOLOGY REPORT*  Clinical Data:  Leukocytosis, acute on chronic renal failure, respiratory failure, recent pneumonia  CT CHEST, ABDOMEN AND PELVIS WITHOUT CONTRAST  Technique:  Multidetector CT imaging of the chest, abdomen and pelvis was performed following the standard protocol without IV contrast.  Comparison:  [place] CT chest dated 09/22/2011, [facility] chest/abdomen/pelvis dated 10/19/2007  CT CHEST  Findings:  Moderate left pleural effusion.  Associated left lower lobe opacity, likely atelectasis, underlying pneumonia not excluded.  Minimal patchy opacities in the left upper lobe and posterior right upper lobe (series 3/image 24), possibly infectious.  Mild interstitial edema.  Underlying centrilobular emphysematous changes.  No pneumothorax.  Visualized thyroid is unremarkable.  The heart is top normal in size.  No pericardial effusion. Coronary atherosclerosis.  There is no calcification of the aortic arch.  No suspicious mediastinal or axillary lymphadenopathy.  Visualized osseous structures are within normal limits.  IMPRESSION: Mild interstitial edema with a moderate left pleural  effusion.  Associated left lower lobe opacity, likely atelectasis, underlying pneumonia not excluded.  Minimal patchy opacities in the left upper lobe and posterior right upper lobe, possibly infectious.  CT ABDOMEN AND PELVIS  Findings:  Small hiatal hernia.  Unenhanced liver, spleen, pancreas, and adrenal glands are within normal limits.  Layering small gallstones.  No associated inflammatory changes.  Severe right renal atrophy, chronic.  Left kidney is unremarkable. No hydronephrosis.  No evidence of bowel obstruction.  Colonic diverticulosis, without associated inflammatory changes.  Pericolonic stranding involving the distal  sigmoid colon (series 2/image 110), worrisome for colitis.  Extensive atherosclerotic calcifications of the abdominal aorta and branch vessels.  No abdominopelvic ascites.  No suspicious abdominopelvic lymphadenopathy.  Uterus and bilateral ovaries are unremarkable.  Bladder is decompressed by indwelling Foley catheter.  Mild degenerative changes of the lumbar spine.  IMPRESSION: Pericolonic stranding involving the distal sigmoid colon, worrisome for colitis.  Cholelithiasis, without associated inflammatory changes.  Severe right renal atrophy, chronic.  Original Report Authenticated By: Charline Bills, M.D.   Ct Chest Wo Contrast  10/22/2011  *RADIOLOGY REPORT*  Clinical Data:  Leukocytosis, acute on chronic renal failure, respiratory failure, recent pneumonia  CT CHEST, ABDOMEN AND PELVIS WITHOUT CONTRAST  Technique:  Multidetector CT imaging of the chest, abdomen and pelvis was performed following the standard protocol without IV contrast.  Comparison:  [place] CT chest dated 09/22/2011, [facility] CT chest/abdomen/pelvis dated 10/19/2007  CT CHEST  Findings:  Moderate left pleural effusion.  Associated left lower lobe opacity, likely atelectasis, underlying pneumonia not excluded.  Minimal patchy opacities in the left upper lobe and posterior right upper lobe (series 3/image 24),  possibly infectious.  Mild interstitial edema.  Underlying centrilobular emphysematous changes.  No pneumothorax.  Visualized thyroid is unremarkable.  The heart is top normal in size.  No pericardial effusion. Coronary atherosclerosis.  There is no calcification of the aortic arch.  No suspicious mediastinal or axillary lymphadenopathy.  Visualized osseous structures are within normal limits.  IMPRESSION: Mild interstitial edema with a moderate left pleural effusion.  Associated left lower lobe opacity, likely atelectasis, underlying pneumonia not excluded.  Minimal patchy opacities in the left upper lobe and posterior right upper lobe, possibly infectious.  CT ABDOMEN AND PELVIS  Findings:  Small hiatal hernia.  Unenhanced liver, spleen, pancreas, and adrenal glands are within normal limits.  Layering small gallstones.  No associated inflammatory changes.  Severe right renal atrophy, chronic.  Left kidney is unremarkable. No hydronephrosis.  No evidence of bowel obstruction.  Colonic diverticulosis, without associated inflammatory changes.  Pericolonic stranding involving the distal sigmoid colon (series 2/image 110), worrisome for colitis.  Extensive atherosclerotic calcifications of the abdominal aorta and branch vessels.  No abdominopelvic ascites.  No suspicious abdominopelvic lymphadenopathy.  Uterus and bilateral ovaries are unremarkable.  Bladder is decompressed by indwelling Foley catheter.  Mild degenerative changes of the lumbar spine.  IMPRESSION: Pericolonic stranding involving the distal sigmoid colon, worrisome for colitis.  Cholelithiasis, without associated inflammatory changes.  Severe right renal atrophy, chronic.  Original Report Authenticated By: Charline Bills, M.D.   US Renal  10/13/2011  *RADIOLOGY REPORT*  Clinical Data: Renal failure.  RENAL/URINARY TRACT ULTRASOUND COMPLETE  Comparison:  Ultrasound dated 09/19/2007 performed at [facility].  Findings:  Right Kidney:  The right  kidney is atrophic, measuring 5.3 cm in length.  No hydronephrosis.  Renal cortex is markedly thinned.  Left Kidney:  Left kidney is hypertrophic with no hydronephrosis or mass.  Slight increased echogenicity of the renal parenchyma consistent with renal medical disease.   13.9 cm in length.  Bladder:  The bladder is empty.  Foley catheter in place.  IMPRESSION: Since the prior exam the right kidney is more atrophic and the left kidney is more hypertrophic.  The slight increase echogenicity of the renal parenchyma on the left is new since the prior study.  Original Report Authenticated By: Gwynn Burly, M.D.   Dg Chest Port 1 View  10/23/2011  *RADIOLOGY REPORT*  Clinical Data: Diatek catheter insertion.  PORTABLE CHEST -  1 VIEW  Comparison: Chest x-ray 12/22/2010.  Findings: The left IJ catheter is stable.  The right IJ diatek catheter is in good position.  The distal port is in the right atrium and the proximal port is near the cavoatrial junction.  No pneumothorax or hematoma.  The heart and lungs are unchanged since earlier film.  IMPRESSION: Right IJ diatek catheter in good position without complicating features.  Original Report Authenticated By: P. Loralie Champagne, M.D.   Dg Chest Port 1 View  10/22/2011  *RADIOLOGY REPORT*  Clinical Data: Pneumonia, follow-up  PORTABLE CHEST - 1 VIEW  Comparison: Portable chest x-ray of 10/19/2011  Findings: Opacity remains at the left lung base most consistent with effusion and atelectasis.  Pneumonia cannot be excluded.  The right lung is clear.  Bilateral IJ central venous lines are unchanged in position.  The heart is borderline enlarged.  IMPRESSION: No change in opacity primarily at the left lung base consistent with effusion, atelectasis, and possibly pneumonia.  Original Report Authenticated By: Juline Patch, M.D.   Dg Chest Port 1 View  10/19/2011  *RADIOLOGY REPORT*  Clinical Data: Shortness of breath.  PORTABLE CHEST - 1 VIEW  Comparison:  10/17/2011.  Findings: Right central line tip proximal superior vena cava level. Left central line tip proximal superior vena cava level directed laterally.  No gross pneumothorax.  Asymmetric air space disease greatest on the left and most notable in the left lung base may represent infectious infiltrate with atelectasis/consolidation left base.  Pulmonary edema not excluded.  Calcified aorta.  Cardiomegaly.  IMPRESSION: No significant change.  Original Report Authenticated By: Fuller Canada, M.D.   Dg Chest Port 1 View  10/17/2011  *RADIOLOGY REPORT*  Clinical Data: Central venous line placement  PORTABLE CHEST - 1 VIEW  Comparison: Portable chest x-ray of 10/17/2011  Findings: There is haziness throughout the left hemithorax most consistent with left effusion with basilar atelectasis present bilaterally.  Slightly more opacification remains at the left lung base.  A left IJ central venous line remains with the tip in the mid upper SVC.  A new right IJ central venous line is present with the tip at the same location within the mid upper SVC.  No pneumothorax is seen.  Heart size is stable.  IMPRESSION:  1.  New right IJ central venous line tip in mid upper SVC.  No pneumothorax. 2.  Little change in left effusion and persistent opacity at the left lung base.  Original Report Authenticated By: Juline Patch, M.D.   Dg Chest Port 1 View  10/17/2011  *RADIOLOGY REPORT*  Clinical Data: Respiratory failure.  PORTABLE CHEST - 1 VIEW  Comparison: 10/16/2011.  Findings: Left IJ central line tip projects over the SVC.  Heart size normal.  Layering left pleural effusion and bibasilar air space disease persist, left greater than right.  IMPRESSION: Layering left pleural effusion and bibasilar air space disease, left greater than right, persist.  Original Report Authenticated By: Reyes Ivan, M.D.   Dg Chest Port 1 View  10/16/2011  *RADIOLOGY REPORT*  Clinical Data: Respiratory failure.  PORTABLE CHEST -  1 VIEW  Comparison: 10/14/2011.  Findings: Left IJ central line tip projects over the SVC.  Heart size stable.  Thoracic aorta is calcified.  There is a moderate left pleural effusion.  Mixed interstitial and airspace disease has improved slightly in the interval. Left lower lobe collapse/consolidation.  IMPRESSION: Slight interval improvement in mixed interstitial and airspace disease, which may be  due to edema.  Moderate left pleural effusion.  Left lower lobe collapse/consolidation.  Original Report Authenticated By: Reyes Ivan, M.D.   Dg Chest Port 1 View  10/14/2011  *RADIOLOGY REPORT*  Clinical Data: Respiratory distress  PORTABLE CHEST - 1 VIEW  Comparison: Multiple recent previous exams.  Findings: 2206 hours. The cardiopericardial silhouette is enlarged. Left greater than right interstitial and basilar airspace disease persist.  Probable small bilateral pleural effusions.  Right IJ central line tip is stable, positioned the proximal SVC level. Telemetry leads overlie the chest.  IMPRESSION: Stable exam.  Cardiomegaly with asymmetric left greater than right interstitial and basilar airspace opacities.  Original Report Authenticated By: ERIC A. MANSELL, M.D.   Dg Chest Port 1 View  10/14/2011  *RADIOLOGY REPORT*  Clinical Data: Follow up edema and pleural effusion  PORTABLE CHEST - 1 VIEW  Comparison: 10/12/2011  Findings: Cardiomediastinal silhouette is stable.  Asymmetric mild interstitial edema left greater than right again noted without change in aeration.  Probable small bilateral pleural effusion again noted.  Stable left IJ central line position.  No diagnostic pneumothorax.  IMPRESSION: Asymmetric mild interstitial edema left greater than right again noted without change in aeration.  Probable small bilateral pleural effusion again noted.  Stable left IJ central line position.  Original Report Authenticated By: Natasha Mead, M.D.   Portable Chest 1 View  10/12/2011  *RADIOLOGY REPORT*   Clinical Data: Shortness of breath.  PORTABLE CHEST - 1 VIEW 10/12/2011 0006 hours:  Comparison: Portable chest x-ray yesterday, 09/25/2011, and two- view chest x-ray 09/24/2011 [facility].  Findings: Left jugular central venous catheter tip in the upper SVC.  No evidence of pneumothorax mediastinal hematoma.  Cardiac silhouette enlarged.  Pulmonary venous hypertension and mild asymmetric interstitial pulmonary edema, left greater than right. Small bilateral pleural effusions, left greater than right.  Dense consolidation in the left lower lobe, unchanged.  IMPRESSION: New asymmetric mild interstitial pulmonary edema, left greater than right.  Stable bilateral pleural effusions, left greater than right.  Stable dense left lower lobe atelectasis and/or pneumonia.  Original Report Authenticated By: Arnell Sieving, M.D.   Dg Fluoro Guide Cv Line-no Report  10/23/2011  CLINICAL DATA: dialysis catherter insertion   FLOURO GUIDE CV LINE  Fluoroscopy was utilized by the requesting physician.  No radiographic  interpretation.      MEDICATIONS: Scheduled Meds:    . ipratropium  0.5 mg Nebulization TID   And  . albuterol  2.5 mg Nebulization TID  . aspirin  81 mg Oral Daily  . calcium acetate  667 mg Oral TID WC  . clopidogrel  75 mg Oral Q breakfast  . darbepoetin (ARANESP) injection - DIALYSIS  200 mcg Intravenous Q Tue-HD  . docusate sodium  100 mg Oral BID  . enoxaparin (LOVENOX) injection  55 mg Subcutaneous Daily  . famotidine  20 mg Oral Daily  . ferric glucontate (NULECIT) IV  125 mg Intravenous Q M,W,F-HD  . guaiFENesin  600 mg Oral BID  . insulin aspart  0-20 Units Subcutaneous TID WC  . insulin aspart  0-5 Units Subcutaneous QHS  . insulin glargine  14 Units Subcutaneous QHS  . isosorbide mononitrate  120 mg Oral Daily  . levothyroxine  25 mcg Oral Q0600  . multivitamin  1 tablet Oral QHS  . vancomycin  125 mg Oral QID   Continuous Infusions:  PRN Meds:.sodium chloride,  sodium chloride, sodium chloride, acetaminophen, acetaminophen, ALPRAZolam, alteplase, calcium carbonate (dosed in mg elemental calcium),  docusate sodium, feeding supplement (NEPRO CARB STEADY), heparin, heparin, heparin, lidocaine, lidocaine-prilocaine, morphine injection, nitroGLYCERIN, ondansetron (ZOFRAN) IV, ondansetron (ZOFRAN) IV, ondansetron, ondansetron, oxyCODONE, pentafluoroprop-tetrafluoroeth, zolpidem  Antibiotics: Anti-infectives     Start     Dose/Rate Route Frequency Ordered Stop   10/23/11 1100   vancomycin (VANCOCIN) 50 mg/mL oral solution 125 mg        125 mg Oral 4 times daily 10/23/11 1059 11/06/11 0959   10/22/11 1400   vancomycin (VANCOCIN) 50 mg/mL oral solution 125 mg  Status:  Discontinued        125 mg Oral 4 times daily 10/22/11 1219 10/22/11 1726   10/15/11 1000   imipenem-cilastatin (PRIMAXIN) 250 mg in sodium chloride 0.9 % 100 mL IVPB  Status:  Discontinued        250 mg 200 mL/hr over 30 Minutes Intravenous Every 12 hours 10/15/11 0022 10/25/11 1537   10/15/11 0030   imipenem-cilastatin (PRIMAXIN) 250 mg in sodium chloride 0.9 % 100 mL IVPB        250 mg 200 mL/hr over 30 Minutes Intravenous NOW 10/15/11 0022 10/15/11 0203   10/14/11 1800   ciprofloxacin (CIPRO) tablet 500 mg  Status:  Discontinued        500 mg Oral Daily 10/14/11 1623 10/14/11 2333   10/11/11 2359   moxifloxacin (AVELOX) IVPB 400 mg  Status:  Discontinued        400 mg 250 mL/hr over 60 Minutes Intravenous Every 24 hours 10/11/11 2306 10/13/11 1700          Assessment/Plan: Patient Active Hospital Problem List: ARF (acute respiratory failure)  Assessment: Better. Tolerating oxygen by nasal cannula now. Etiology is multifactorial with pulmonary edema, presumed pulmonary embolism and possible pneumonia.    Plan: Would continue with oxygen as tolerated. Continues to be stable.  CKD (chronic kidney disease) stage 5,    Assessment: Now with end-stage renal disease.    Plan:  Hemodialysis per renal.   DM (diabetes mellitus)    Assessment: CBGs continue to be stable.    Plan: Continue with Lantus and sliding scale insulin.    DVT with presumed pulmonary embolism   Assessment: Stable    Plan: Continue with Lovenox, need to clarify whether permacath accesses planned this admission-otherwise will need to restart Coumadin.   HTN (hypertension)    Assessment: Controlled    Plan: Continue with Imdur.   Anemia    Assessment: Likely secondary to critical illness and end-stage renal disease.    Plan: Continue to monitor for now, on darbepoetin and intravenous iron. We'll need to transfuse if hemoglobin drops less than 7, however continues to be stable in low 7's.   Diarrhea-presumed C. difficile colitis   Assessment: Unchanged   Plan: Continue with empiric oral  Vancomycin for a total of 14 days   ESBL ESCHERICHIA coli UTI  Assessment: Afebrile, completed antibiotic course  Plan: Monitor off antibiotics.  Hypothyroidism  Assessment: Stable, TSH within normal limits.  Plan: Continue with levothyroxine.   Cardiac enzymes elevated    Assessment: Had elevated cardiac enzymes this admission. Seen by cardiology-no further workup recommended given good LV function.    Plan: Continue with antiplatelet agents.    COPD   Assessment: Stable   Plan: Continue with albuterol and Atrovent nebs.  Deconditioning   Assessment: Secondary to prolonged hospitalization and critical illness.   Plan: Obtain PT and OT eval  Disposition: Remain inpatient -SNF at discharge  DVT Prophylaxis:  on therapeutic Lovenox  Code Status:  limited code   Maretta Bees,  MD. 10/27/2011, 4:03 PM

## 2011-10-28 LAB — GLUCOSE, CAPILLARY
Glucose-Capillary: 135 mg/dL — ABNORMAL HIGH (ref 70–99)
Glucose-Capillary: 192 mg/dL — ABNORMAL HIGH (ref 70–99)

## 2011-10-28 LAB — CULTURE, BLOOD (ROUTINE X 2): Culture: NO GROWTH

## 2011-10-28 LAB — PROTIME-INR
INR: 1.36 (ref 0.00–1.49)
Prothrombin Time: 17 seconds — ABNORMAL HIGH (ref 11.6–15.2)

## 2011-10-28 MED ORDER — SODIUM CHLORIDE 0.9 % IJ SOLN
10.0000 mL | INTRAMUSCULAR | Status: DC | PRN
  Administered 2011-10-28 – 2011-10-31 (×8): 10 mL

## 2011-10-28 MED ORDER — DARBEPOETIN ALFA-POLYSORBATE 200 MCG/0.4ML IJ SOLN
200.0000 ug | INTRAMUSCULAR | Status: DC
  Administered 2011-10-30: 200 ug via INTRAVENOUS
  Filled 2011-10-28: qty 0.4

## 2011-10-28 MED ORDER — PROMETHAZINE HCL 25 MG/ML IJ SOLN
12.5000 mg | Freq: Four times a day (QID) | INTRAMUSCULAR | Status: DC | PRN
  Administered 2011-10-28: 12.5 mg via INTRAVENOUS
  Filled 2011-10-28: qty 1

## 2011-10-28 NOTE — Progress Notes (Signed)
Pt refused tray due to sick feeling on stomach

## 2011-10-28 NOTE — Progress Notes (Addendum)
PATIENT DETAILS Name: [name] [name] Age: 70 y.o. Sex: female Date of Birth: 1941/02/19 Admit Date: 10/11/2011 PCP:No primary provider on file.  Subjective: Seen and examined, has no major complaints today. No BM's today  Objective: Vital signs in last 24 hours: Filed Vitals:   10/27/11 1942 10/27/11 2145 10/28/11 0500 10/28/11 0918  BP:  147/70 123/64 154/77  Pulse:  110 95 103  Temp:  98.6 F (37 C) 97.7 F (36.5 C) 97.4 F (36.3 C)  TempSrc:  Oral Oral Oral  Resp:  18 20 20   Height:      Weight:  54.9 kg (121 lb 0.5 oz)    SpO2: 97% 91% 98% 94%    Weight change: 9.6 kg (21 lb 2.6 oz)  Body mass index is 20.14 kg/(m^2).  Intake/Output from previous day:  Intake/Output Summary (Last 24 hours) at 10/28/11 1224 Last data filed at 10/28/11 0912  Gross per 24 hour  Intake    480 ml  Output   1101 ml  Net   -621 ml    PHYSICAL EXAM: Gen Exam: Awake and alert with clear speech.   Neck: Supple, No JVD.   Chest: B/L Clear.  No added sounds CVS: S1 S2 Regular, no murmurs.  Abdomen: soft, BS +, non tender, non distended.  Extremities: no edema, lower extremities warm to touch. Neurologic: Non Focal.   Skin: No Rash.   Wounds: N/A.    CONSULTS:  nephrology  LAB RESULTS: CBC  Lab 10/27/11 1127 10/26/11 0615 10/25/11 0700 10/24/11 0439 10/23/11 0430  WBC 17.4* 18.0* 19.8* 21.5* 24.7*  HGB 7.3* 7.1* 7.2* 7.7* 7.4*  HCT 23.6* 22.5* 23.3* 24.6* 22.9*  PLT 364 368 355 382 375  MCV 86.8 86.2 85.7 82.8 82.1  MCH 26.8 27.2 26.5 25.9* 26.5  MCHC 30.9 31.6 30.9 31.3 32.3  RDW 20.2* 18.4* 18.1* 17.7* 17.3*  LYMPHSABS -- -- -- -- --  MONOABS -- -- -- -- --  EOSABS -- -- -- -- --  BASOSABS -- -- -- -- --  BANDABS -- -- -- -- --    Chemistries   Lab 10/27/11 1127 10/25/11 0810 10/24/11 0430 10/23/11 0430 10/22/11 0430  NA 129* 138 138 135 135  K 3.5 3.5 3.8 3.6 4.2  CL 89* 99 97 93* 94*  CO2 27 27 28 27 26   GLUCOSE 303* 102* 137* 56* 155*  BUN 31* 25* 29* 68* 48*    CREATININE 3.63* 2.45* 2.54* 4.44* 3.69*  CALCIUM 9.1 9.2 9.0 9.4 8.9  MG -- -- -- -- --    GFR Estimated Creatinine Clearance: 12.9 ml/min (by C-G formula based on Cr of 3.63).  Coagulation profile  Lab 10/28/11 0545 10/27/11 0537 10/26/11 0615 10/25/11 0700 10/24/11 0439  INR 1.36 2.07* 2.93* 3.91* 1.76*  PROTIME -- -- -- -- --    Cardiac Enzymes No results found for this basename: CK:3,CKMB:3,TROPONINI:3,MYOGLOBIN:3 in the last 168 hours  No components found with this basename: POCBNP:3 No results found for this basename: DDIMER:2 in the last 72 hours No results found for this basename: HGBA1C:2 in the last 72 hours No results found for this basename: CHOL:2,HDL:2,LDLCALC:2,TRIG:2,CHOLHDL:2,LDLDIRECT:2 in the last 72 hours No results found for this basename: TSH,T4TOTAL,FREET3,T3FREE,THYROIDAB in the last 72 hours No results found for this basename: VITAMINB12:2,FOLATE:2,FERRITIN:2,TIBC:2,IRON:2,RETICCTPCT:2 in the last 72 hours No results found for this basename: LIPASE:2,AMYLASE:2 in the last 72 hours  Urine Studies No results found for this basename: UACOL:2,UAPR:2,USPG:2,UPH:2,UTP:2,UGL:2,UKET:2,UBIL:2,UHGB:2,UNIT:2,UROB:2,ULEU:2,UEPI:2,UWBC:2,URBC:2,UBAC:2,CAST:2,CRYS:2,UCOM:2,BILUA:2 in the last 72 hours  MICROBIOLOGY: Recent Results (  from the past 240 hour(s))  CULTURE, BLOOD (ROUTINE X 2)     Status: Normal   Collection Time   10/22/11 12:45 PM      Component Value Range Status Comment   Specimen Description BLOOD RIGHT HAND   Final    Special Requests BOTTLES DRAWN AEROBIC AND ANAEROBIC 10CC   Final    Setup Time [id number]   Final    Culture NO GROWTH 5 DAYS   Final    Report Status 10/28/2011 FINAL   Final   CULTURE, BLOOD (ROUTINE X 2)     Status: Normal   Collection Time   10/22/11 12:55 PM      Component Value Range Status Comment   Specimen Description BLOOD RIGHT WRIST   Final    Special Requests BOTTLES DRAWN AEROBIC ONLY 5CC   Final    Setup Time  [id number]   Final    Culture NO GROWTH 5 DAYS   Final    Report Status 10/28/2011 FINAL   Final   URINE CULTURE     Status: Normal   Collection Time   10/22/11  3:31 PM      Component Value Range Status Comment   Specimen Description URINE, CATHETERIZED   Final    Special Requests NONE   Final    Setup Time [id number]   Final    Colony Count NO GROWTH   Final    Culture NO GROWTH   Final    Report Status 10/23/2011 FINAL   Final   CLOSTRIDIUM DIFFICILE BY PCR     Status: Abnormal   Collection Time   10/27/11  6:28 PM      Component Value Range Status Comment   C difficile by pcr POSITIVE (*) NEGATIVE  Final     RADIOLOGY STUDIES/RESULTS: Ct Abdomen Pelvis Wo Contrast  10/22/2011  *RADIOLOGY REPORT*  Clinical Data:  Leukocytosis, acute on chronic renal failure, respiratory failure, recent pneumonia  CT CHEST, ABDOMEN AND PELVIS WITHOUT CONTRAST  Technique:  Multidetector CT imaging of the chest, abdomen and pelvis was performed following the standard protocol without IV contrast.  Comparison:  [facility] CT chest dated 09/22/2011, [facility] CT chest/abdomen/pelvis dated 10/19/2007  CT CHEST  Findings:  Moderate left pleural effusion.  Associated left lower lobe opacity, likely atelectasis, underlying pneumonia not excluded.  Minimal patchy opacities in the left upper lobe and posterior right upper lobe (series 3/image 24), possibly infectious.  Mild interstitial edema.  Underlying centrilobular emphysematous changes.  No pneumothorax.  Visualized thyroid is unremarkable.  The heart is top normal in size.  No pericardial effusion. Coronary atherosclerosis.  There is no calcification of the aortic arch.  No suspicious mediastinal or axillary lymphadenopathy.  Visualized osseous structures are within normal limits.  IMPRESSION: Mild interstitial edema with a moderate left pleural effusion.  Associated left lower lobe opacity, likely atelectasis, underlying pneumonia not excluded.  Minimal patchy  opacities in the left upper lobe and posterior right upper lobe, possibly infectious.  CT ABDOMEN AND PELVIS  Findings:  Small hiatal hernia.  Unenhanced liver, spleen, pancreas, and adrenal glands are within normal limits.  Layering small gallstones.  No associated inflammatory changes.  Severe right renal atrophy, chronic.  Left kidney is unremarkable. No hydronephrosis.  No evidence of bowel obstruction.  Colonic diverticulosis, without associated inflammatory changes.  Pericolonic stranding involving the distal sigmoid colon (series 2/image 110), worrisome for colitis.  Extensive atherosclerotic calcifications of the abdominal aorta and branch vessels.  No abdominopelvic ascites.  No suspicious abdominopelvic lymphadenopathy.  Uterus and bilateral ovaries are unremarkable.  Bladder is decompressed by indwelling Foley catheter.  Mild degenerative changes of the lumbar spine.  IMPRESSION: Pericolonic stranding involving the distal sigmoid colon, worrisome for colitis.  Cholelithiasis, without associated inflammatory changes.  Severe right renal atrophy, chronic.  Original Report Authenticated By: Charline Bills, M.D.   Ct Chest Wo Contrast  10/22/2011  *RADIOLOGY REPORT*  Clinical Data:  Leukocytosis, acute on chronic renal failure, respiratory failure, recent pneumonia  CT CHEST, ABDOMEN AND PELVIS WITHOUT CONTRAST  Technique:  Multidetector CT imaging of the chest, abdomen and pelvis was performed following the standard protocol without IV contrast.  Comparison:  [facility] CT chest dated 09/22/2011, [facility] CT chest/abdomen/pelvis dated 10/19/2007  CT CHEST  Findings:  Moderate left pleural effusion.  Associated left lower lobe opacity, likely atelectasis, underlying pneumonia not excluded.  Minimal patchy opacities in the left upper lobe and posterior right upper lobe (series 3/image 24), possibly infectious.  Mild interstitial edema.  Underlying centrilobular emphysematous changes.  No pneumothorax.   Visualized thyroid is unremarkable.  The heart is top normal in size.  No pericardial effusion. Coronary atherosclerosis.  There is no calcification of the aortic arch.  No suspicious mediastinal or axillary lymphadenopathy.  Visualized osseous structures are within normal limits.  IMPRESSION: Mild interstitial edema with a moderate left pleural effusion.  Associated left lower lobe opacity, likely atelectasis, underlying pneumonia not excluded.  Minimal patchy opacities in the left upper lobe and posterior right upper lobe, possibly infectious.  CT ABDOMEN AND PELVIS  Findings:  Small hiatal hernia.  Unenhanced liver, spleen, pancreas, and adrenal glands are within normal limits.  Layering small gallstones.  No associated inflammatory changes.  Severe right renal atrophy, chronic.  Left kidney is unremarkable. No hydronephrosis.  No evidence of bowel obstruction.  Colonic diverticulosis, without associated inflammatory changes.  Pericolonic stranding involving the distal sigmoid colon (series 2/image 110), worrisome for colitis.  Extensive atherosclerotic calcifications of the abdominal aorta and branch vessels.  No abdominopelvic ascites.  No suspicious abdominopelvic lymphadenopathy.  Uterus and bilateral ovaries are unremarkable.  Bladder is decompressed by indwelling Foley catheter.  Mild degenerative changes of the lumbar spine.  IMPRESSION: Pericolonic stranding involving the distal sigmoid colon, worrisome for colitis.  Cholelithiasis, without associated inflammatory changes.  Severe right renal atrophy, chronic.  Original Report Authenticated By: Charline Bills, M.D.   US Renal  10/13/2011  *RADIOLOGY REPORT*  Clinical Data: Renal failure.  RENAL/URINARY TRACT ULTRASOUND COMPLETE  Comparison:  Ultrasound dated 09/19/2007 performed at [facility].  Findings:  Right Kidney:  The right kidney is atrophic, measuring 5.3 cm in length.  No hydronephrosis.  Renal cortex is markedly thinned.  Left  Kidney:  Left kidney is hypertrophic with no hydronephrosis or mass.  Slight increased echogenicity of the renal parenchyma consistent with renal medical disease.   13.9 cm in length.  Bladder:  The bladder is empty.  Foley catheter in place.  IMPRESSION: Since the prior exam the right kidney is more atrophic and the left kidney is more hypertrophic.  The slight increase echogenicity of the renal parenchyma on the left is new since the prior study.  Original Report Authenticated By: Gwynn Burly, M.D.   Dg Chest Port 1 View  10/23/2011  *RADIOLOGY REPORT*  Clinical Data: Diatek catheter insertion.  PORTABLE CHEST - 1 VIEW  Comparison: Chest x-ray 12/22/2010.  Findings: The left IJ catheter is stable.  The right IJ diatek catheter is in good  position.  The distal port is in the right atrium and the proximal port is near the cavoatrial junction.  No pneumothorax or hematoma.  The heart and lungs are unchanged since earlier film.  IMPRESSION: Right IJ diatek catheter in good position without complicating features.  Original Report Authenticated By: P. Loralie Champagne, M.D.   Dg Chest Port 1 View  10/22/2011  *RADIOLOGY REPORT*  Clinical Data: Pneumonia, follow-up  PORTABLE CHEST - 1 VIEW  Comparison: Portable chest x-ray of 10/19/2011  Findings: Opacity remains at the left lung base most consistent with effusion and atelectasis.  Pneumonia cannot be excluded.  The right lung is clear.  Bilateral IJ central venous lines are unchanged in position.  The heart is borderline enlarged.  IMPRESSION: No change in opacity primarily at the left lung base consistent with effusion, atelectasis, and possibly pneumonia.  Original Report Authenticated By: Juline Patch, M.D.   Dg Chest Port 1 View  10/19/2011  *RADIOLOGY REPORT*  Clinical Data: Shortness of breath.  PORTABLE CHEST - 1 VIEW  Comparison: 10/17/2011.  Findings: Right central line tip proximal superior vena cava level. Left central line tip proximal  superior vena cava level directed laterally.  No gross pneumothorax.  Asymmetric air space disease greatest on the left and most notable in the left lung base may represent infectious infiltrate with atelectasis/consolidation left base.  Pulmonary edema not excluded.  Calcified aorta.  Cardiomegaly.  IMPRESSION: No significant change.  Original Report Authenticated By: Fuller Canada, M.D.   Dg Chest Port 1 View  10/17/2011  *RADIOLOGY REPORT*  Clinical Data: Central venous line placement  PORTABLE CHEST - 1 VIEW  Comparison: Portable chest x-ray of 10/17/2011  Findings: There is haziness throughout the left hemithorax most consistent with left effusion with basilar atelectasis present bilaterally.  Slightly more opacification remains at the left lung base.  A left IJ central venous line remains with the tip in the mid upper SVC.  A new right IJ central venous line is present with the tip at the same location within the mid upper SVC.  No pneumothorax is seen.  Heart size is stable.  IMPRESSION:  1.  New right IJ central venous line tip in mid upper SVC.  No pneumothorax. 2.  Little change in left effusion and persistent opacity at the left lung base.  Original Report Authenticated By: Juline Patch, M.D.   Dg Chest Port 1 View  10/17/2011  *RADIOLOGY REPORT*  Clinical Data: Respiratory failure.  PORTABLE CHEST - 1 VIEW  Comparison: 10/16/2011.  Findings: Left IJ central line tip projects over the SVC.  Heart size normal.  Layering left pleural effusion and bibasilar air space disease persist, left greater than right.  IMPRESSION: Layering left pleural effusion and bibasilar air space disease, left greater than right, persist.  Original Report Authenticated By: Reyes Ivan, M.D.   Dg Chest Port 1 View  10/16/2011  *RADIOLOGY REPORT*  Clinical Data: Respiratory failure.  PORTABLE CHEST - 1 VIEW  Comparison: 10/14/2011.  Findings: Left IJ central line tip projects over the SVC.  Heart size stable.   Thoracic aorta is calcified.  There is a moderate left pleural effusion.  Mixed interstitial and airspace disease has improved slightly in the interval. Left lower lobe collapse/consolidation.  IMPRESSION: Slight interval improvement in mixed interstitial and airspace disease, which may be due to edema.  Moderate left pleural effusion.  Left lower lobe collapse/consolidation.  Original Report Authenticated By: Reyes Ivan, M.D.  Dg Chest Port 1 View  10/14/2011  *RADIOLOGY REPORT*  Clinical Data: Respiratory distress  PORTABLE CHEST - 1 VIEW  Comparison: Multiple recent previous exams.  Findings: 2206 hours. The cardiopericardial silhouette is enlarged. Left greater than right interstitial and basilar airspace disease persist.  Probable small bilateral pleural effusions.  Right IJ central line tip is stable, positioned the proximal SVC level. Telemetry leads overlie the chest.  IMPRESSION: Stable exam.  Cardiomegaly with asymmetric left greater than right interstitial and basilar airspace opacities.  Original Report Authenticated By: ERIC A. MANSELL, M.D.   Dg Chest Port 1 View  10/14/2011  *RADIOLOGY REPORT*  Clinical Data: Follow up edema and pleural effusion  PORTABLE CHEST - 1 VIEW  Comparison: 10/12/2011  Findings: Cardiomediastinal silhouette is stable.  Asymmetric mild interstitial edema left greater than right again noted without change in aeration.  Probable small bilateral pleural effusion again noted.  Stable left IJ central line position.  No diagnostic pneumothorax.  IMPRESSION: Asymmetric mild interstitial edema left greater than right again noted without change in aeration.  Probable small bilateral pleural effusion again noted.  Stable left IJ central line position.  Original Report Authenticated By: Natasha Mead, M.D.   Portable Chest 1 View  10/12/2011  *RADIOLOGY REPORT*  Clinical Data: Shortness of breath.  PORTABLE CHEST - 1 VIEW 10/12/2011 0006 hours:  Comparison: Portable chest  x-ray yesterday, 09/25/2011, and two- view chest x-ray 09/24/2011 [facility].  Findings: Left jugular central venous catheter tip in the upper SVC.  No evidence of pneumothorax mediastinal hematoma.  Cardiac silhouette enlarged.  Pulmonary venous hypertension and mild asymmetric interstitial pulmonary edema, left greater than right. Small bilateral pleural effusions, left greater than right.  Dense consolidation in the left lower lobe, unchanged.  IMPRESSION: New asymmetric mild interstitial pulmonary edema, left greater than right.  Stable bilateral pleural effusions, left greater than right.  Stable dense left lower lobe atelectasis and/or pneumonia.  Original Report Authenticated By: Arnell Sieving, M.D.   Dg Fluoro Guide Cv Line-no Report  10/23/2011  CLINICAL DATA: dialysis catherter insertion   FLOURO GUIDE CV LINE  Fluoroscopy was utilized by the requesting physician.  No radiographic  interpretation.      MEDICATIONS: Scheduled Meds:    . ipratropium  0.5 mg Nebulization TID   And  . albuterol  2.5 mg Nebulization TID  . aspirin  81 mg Oral Daily  . calcium acetate  667 mg Oral TID WC  . clopidogrel  75 mg Oral Q breakfast  . darbepoetin (ARANESP) injection - DIALYSIS  200 mcg Intravenous Q Tue-HD  . docusate sodium  100 mg Oral BID  . enoxaparin (LOVENOX) injection  55 mg Subcutaneous Daily  . famotidine  20 mg Oral Daily  . ferric glucontate (NULECIT) IV  125 mg Intravenous Q M,W,F-HD  . guaiFENesin  600 mg Oral BID  . insulin aspart  0-20 Units Subcutaneous TID WC  . insulin aspart  0-5 Units Subcutaneous QHS  . insulin glargine  14 Units Subcutaneous QHS  . isosorbide mononitrate  120 mg Oral Daily  . levothyroxine  25 mcg Oral Q0600  . multivitamin  1 tablet Oral QHS  . vancomycin  125 mg Oral QID   Continuous Infusions:  PRN Meds:.sodium chloride, sodium chloride, sodium chloride, acetaminophen, acetaminophen, ALPRAZolam, calcium carbonate (dosed in mg  elemental calcium), docusate sodium, feeding supplement (NEPRO CARB STEADY), heparin, heparin, heparin, lidocaine, lidocaine-prilocaine, morphine injection, nitroGLYCERIN, ondansetron (ZOFRAN) IV, ondansetron (ZOFRAN) IV, ondansetron, ondansetron, oxyCODONE, pentafluoroprop-tetrafluoroeth,  zolpidem  Antibiotics: Anti-infectives     Start     Dose/Rate Route Frequency Ordered Stop   10/23/11 1100   vancomycin (VANCOCIN) 50 mg/mL oral solution 125 mg        125 mg Oral 4 times daily 10/23/11 1059 11/06/11 0959   10/22/11 1400   vancomycin (VANCOCIN) 50 mg/mL oral solution 125 mg  Status:  Discontinued        125 mg Oral 4 times daily 10/22/11 1219 10/22/11 1726   10/15/11 1000   imipenem-cilastatin (PRIMAXIN) 250 mg in sodium chloride 0.9 % 100 mL IVPB  Status:  Discontinued        250 mg 200 mL/hr over 30 Minutes Intravenous Every 12 hours 10/15/11 0022 10/25/11 1537   10/15/11 0030   imipenem-cilastatin (PRIMAXIN) 250 mg in sodium chloride 0.9 % 100 mL IVPB        250 mg 200 mL/hr over 30 Minutes Intravenous NOW 10/15/11 0022 10/15/11 0203   10/14/11 1800   ciprofloxacin (CIPRO) tablet 500 mg  Status:  Discontinued        500 mg Oral Daily 10/14/11 1623 10/14/11 2333   10/11/11 2359   moxifloxacin (AVELOX) IVPB 400 mg  Status:  Discontinued        400 mg 250 mL/hr over 60 Minutes Intravenous Every 24 hours 10/11/11 2306 10/13/11 1700          Assessment/Plan: Patient Active Hospital Problem List: ARF (acute respiratory failure)  Assessment: Better. Tolerating oxygen by nasal cannula now. Etiology is multifactorial with pulmonary edema, presumed pulmonary embolism and possible pneumonia.    Plan: Would continue with oxygen as tolerated. Continues to be stable.  CKD (chronic kidney disease) stage 5,    Assessment: Now with end-stage renal disease.    Plan: Hemodialysis per renal.   DM (diabetes mellitus)    Assessment: CBGs continue to be stable.    Plan: Continue with Lantus  and sliding scale insulin.    DVT with presumed pulmonary embolism   Assessment: Stable    Plan: Continue with Lovenox, need to clarify whether permacath accesses planned this admission-otherwise will need to restart Coumadin.   HTN (hypertension)    Assessment: Controlled    Plan: Continue with Imdur.   Anemia    Assessment: Likely secondary to critical illness and end-stage renal disease.    Plan: Continue to monitor for now, on darbepoetin and intravenous iron. We'll need to transfuse if hemoglobin drops less than 7, however continues to be stable in low 7's.   Diarrhea-from C. difficile colitis   Assessment: Unchanged   Plan: Continue with empiric oral  Vancomycin for a total of 14 days   ESBL ESCHERICHIA coli UTI  Assessment: Afebrile, completed antibiotic course  Plan: Monitor off antibiotics.  Hypothyroidism  Assessment: Stable, TSH within normal limits.  Plan: Continue with levothyroxine.   Cardiac enzymes elevated    Assessment: Had elevated cardiac enzymes this admission. Seen by cardiology-no further workup recommended given good LV function.    Plan: Continue with antiplatelet agents.    COPD   Assessment: Stable, lungs clear on exam   Plan: Continue with albuterol and Atrovent nebs.  Deconditioning   Assessment: Secondary to prolonged hospitalization and critical illness.   Plan: Continue with PT/OT  Disposition: Remain inpatient -SNF at discharge  DVT Prophylaxis:  on therapeutic Lovenox   Code Status:  limited code   Maretta Bees,  MD. 10/28/2011, 12:24 PM

## 2011-10-28 NOTE — Progress Notes (Signed)
10/28/2011 8:45 AM  VVS WBC decreasing but still 17K, afebrile C. Diff +  Filed Vitals:   10/27/11 1626 10/27/11 1942 10/27/11 2145 10/28/11 0500  BP: 155/78  147/70 123/64  Pulse: 111  110 95  Temp: 97.9 F (36.6 C)  98.6 F (37 C) 97.7 F (36.5 C)  TempSrc: Oral  Oral Oral  Resp: 20  18 20   Height:      Weight:   121 lb 0.5 oz (54.9 kg)   SpO2: 97% 97% 91% 98%   Assessment: ESRD - needs permanent access. Plan: if WBC continue to drop will consider perm access later this week. Pt. May need Graft vs BVT.  [name] [name] J 10/28/2011 .now

## 2011-10-28 NOTE — Progress Notes (Signed)
Notified Ghimire, MD of critical lab: pt positive for cdiff.

## 2011-10-28 NOTE — Progress Notes (Signed)
ANTICOAGULATION CONSULT NOTE - Follow Up Consult  Pharmacy Consult for Lovenox Indication: DVT  Allergies  Allergen Reactions  . Novocain Other (See Comments)    Unknown    Patient Measurements: Height: 5\' 5"  (165.1 cm) Weight: 121 lb 0.5 oz (54.9 kg) IBW/kg (Calculated) : 57  Adjusted Body Weight:   Vital Signs: Temp: 97.4 F (36.3 C) (12/25 0918) Temp src: Oral (12/25 0918) BP: 154/77 mmHg (12/25 0918) Pulse Rate: 103  (12/25 0918)  Labs:  Basename 10/28/11 0545 10/27/11 1127 10/27/11 0537 10/26/11 0615  HGB -- 7.3* -- 7.1*  HCT -- 23.6* -- 22.5*  PLT -- 364 -- 368  APTT -- -- -- --  LABPROT 17.0* -- 23.7* 31.0*  INR 1.36 -- 2.07* 2.93*  HEPARINUNFRC -- -- -- <0.10*  CREATININE -- 3.63* -- --  CKTOTAL -- -- -- --  CKMB -- -- -- --  TROPONINI -- -- -- --   Estimated Creatinine Clearance: 12.9 ml/min (by C-G formula based on Cr of 3.63).   Medications:  Scheduled:     . ipratropium  0.5 mg Nebulization TID   And  . albuterol  2.5 mg Nebulization TID  . aspirin  81 mg Oral Daily  . calcium acetate  667 mg Oral TID WC  . clopidogrel  75 mg Oral Q breakfast  . darbepoetin (ARANESP) injection - DIALYSIS  200 mcg Intravenous Q Tue-HD  . docusate sodium  100 mg Oral BID  . enoxaparin (LOVENOX) injection  55 mg Subcutaneous Daily  . famotidine  20 mg Oral Daily  . ferric glucontate (NULECIT) IV  125 mg Intravenous Q M,W,F-HD  . guaiFENesin  600 mg Oral BID  . insulin aspart  0-20 Units Subcutaneous TID WC  . insulin aspart  0-5 Units Subcutaneous QHS  . insulin glargine  14 Units Subcutaneous QHS  . isosorbide mononitrate  120 mg Oral Daily  . levothyroxine  25 mcg Oral Q0600  . multivitamin  1 tablet Oral QHS  . vancomycin  125 mg Oral QID   Assessment:  Anticoagulation: RLE DVT. Presumed PE, INR down 1.36 -warfarin on hold. No further nosebleed. Per notes, patient to get permanent dialysis access later this week if WBC improves; therefore, will stop  Coumadin and start Lovenox for anticoagulation. MD aware pt on HD.  Infectious Disease: Day#5 Vanc 125 mg PO qid for C diff infection., afebrile, bowel movements still loose per note. S/p Primaxin for E.coli UTI. Cardiovascular: HTN/CAD: 154/77, HR 100 - on home ASA, Plavix, Imdur Endocrinology: Levothyroxine per home dose.  Gastrointestinal / Nutrition: colitis, CBGs =135-249 Lantus + Novolog Nephrology: ESRD on HD TTS - on Phoslo, [facility] Pulmonary: chest CT 12/19 notable for ?opacities and pleural effusion, on albuterol/ipratropium nebs. 96% 3L [place], on guaifenesin, atrovent/albuterol Hematology / Oncology: Aranesp, IV iron  Best Practices: Coumadin, Pepcid, home meds addressed.  Goal of Therapy:  Anti-Xa = 0.6-1.2   Plan:  1. Continue lovenox 55mg  SQ q24h and await order to start coumadin after line placement, possibly later this week.  2. CBC q72h 3. D/C daily INR now that <1.5  Dannielle Huh 10/28/2011,12:06 PM

## 2011-10-28 NOTE — Progress Notes (Signed)
Attempted to ambulate patient, but she stated she felt too weak and didn't want to fall. Got pt up to bedside commode and back to bed. Will try to ambulate again later.

## 2011-10-28 NOTE — Progress Notes (Signed)
Subjective: Interval History: none.  Only 2 small BM yest  Objective: Vital signs in last 24 hours: Temp:  [97.4 F (36.3 C)-98.6 F (37 C)] 97.4 F (36.3 C) (12/25 0918) Pulse Rate:  [95-111] 103  (12/25 0918) Resp:  [18-20] 20  (12/25 0918) BP: (84-155)/(46-78) 154/77 mmHg (12/25 0918) SpO2:  [91 %-98 %] 94 % (12/25 0918) Weight:  [54.9 kg (121 lb 0.5 oz)-63.4 kg (139 lb 12.4 oz)] 121 lb 0.5 oz (54.9 kg) (12/24 2145) Weight change: 9.6 kg (21 lb 2.6 oz)  Intake/Output from previous day: 12/24 0701 - 12/25 0700 In: 240 [P.O.:240] Out: 1101 [Urine:100; Stool:1] Intake/Output this shift: Total I/O In: 360 [P.O.:360] Out: -   General appearance: alert, cooperative and pale Resp: clear to auscultation bilaterally Chest wall: no tenderness, RIJ PC Cardio: S1, S2 normal and systolic murmur: holosystolic 2/6, blowing at apex GI: doft,pos bs, liver down 4 cm  Lab Results:  [facility] 10/27/11 1127 10/26/11 0615  WBC 17.4* 18.0*  HGB 7.3* 7.1*  HCT 23.6* 22.5*  PLT 364 368   BMET:  Basename 10/27/11 1127  NA 129*  K 3.5  CL 89*  CO2 27  GLUCOSE 303*  BUN 31*  CREATININE 3.63*  CALCIUM 9.1   No results found for this basename: PTH:2 in the last 72 hours Iron Studies: No results found for this basename: IRON,TIBC,TRANSFERRIN,FERRITIN in the last 72 hours  Studies/Results: No results found.  I have reviewed the patient's current medications.  Assessment/Plan: 1 ESRD stable, awaiting perm access.  HD TTS 2 Dm fair control 3 Anemia EPO/fe. 4 DVT enox 5 ^ WBC follow. 6 Copd P epo/fe, HD TTS, follow WBC    LOS: 17 days   Emaline Karnes L 10/28/2011,10:52 AM

## 2011-10-29 LAB — CBC
MCHC: 31.1 g/dL (ref 30.0–36.0)
MCV: 89.9 fL (ref 78.0–100.0)
Platelets: 313 10*3/uL (ref 150–400)
RDW: 23.3 % — ABNORMAL HIGH (ref 11.5–15.5)
WBC: 11.1 10*3/uL — ABNORMAL HIGH (ref 4.0–10.5)

## 2011-10-29 LAB — RENAL FUNCTION PANEL
Albumin: 2.6 g/dL — ABNORMAL LOW (ref 3.5–5.2)
Chloride: 95 mEq/L — ABNORMAL LOW (ref 96–112)
Creatinine, Ser: 3.12 mg/dL — ABNORMAL HIGH (ref 0.50–1.10)
GFR calc non Af Amer: 14 mL/min — ABNORMAL LOW (ref >90)
Potassium: 2.8 mEq/L — ABNORMAL LOW (ref 3.5–5.1)

## 2011-10-29 LAB — GLUCOSE, CAPILLARY: Glucose-Capillary: 183 mg/dL — ABNORMAL HIGH (ref 70–99)

## 2011-10-29 MED ORDER — PENTAFLUOROPROP-TETRAFLUOROETH EX AERO: 1.0000 "application " | INHALATION_SPRAY | CUTANEOUS | Status: DC | PRN

## 2011-10-29 MED ORDER — SODIUM CHLORIDE 0.9 % IV SOLN: 100.0000 mL | INTRAVENOUS | Status: DC | PRN

## 2011-10-29 MED ORDER — LIDOCAINE-PRILOCAINE 2.5-2.5 % EX CREA: 1.0000 "application " | TOPICAL_CREAM | CUTANEOUS | Status: DC | PRN

## 2011-10-29 MED ORDER — HEPARIN SODIUM (PORCINE) 1000 UNIT/ML DIALYSIS: 100.0000 [IU]/kg | INTRAMUSCULAR | Status: DC | PRN

## 2011-10-29 MED ORDER — NEPRO/CARBSTEADY PO LIQD: 237.0000 mL | ORAL | Status: DC | PRN

## 2011-10-29 MED ORDER — SODIUM CHLORIDE 0.9 % IJ SOLN
125.0000 mg | INTRAVENOUS | Status: DC
  Filled 2011-10-29 (×2): qty 10

## 2011-10-29 MED ORDER — ALTEPLASE 2 MG IJ SOLR: 2.0000 mg | Freq: Once | INTRAMUSCULAR | Status: AC | PRN

## 2011-10-29 MED ORDER — POTASSIUM CHLORIDE CRYS ER 20 MEQ PO TBCR
40.0000 meq | EXTENDED_RELEASE_TABLET | Freq: Once | ORAL | Status: AC
  Administered 2011-10-29: 40 meq via ORAL
  Filled 2011-10-29: qty 2

## 2011-10-29 MED ORDER — HEPARIN SODIUM (PORCINE) 1000 UNIT/ML DIALYSIS: 1000.0000 [IU] | INTRAMUSCULAR | Status: DC | PRN

## 2011-10-29 MED ORDER — LIDOCAINE HCL (PF) 1 % IJ SOLN: 5.0000 mL | INTRAMUSCULAR | Status: DC | PRN

## 2011-10-29 NOTE — Progress Notes (Signed)
Subjective: Interval History: none.  Objective: Vital signs in last 24 hours: Temp:  [97.4 F (36.3 C)-99.3 F (37.4 C)] 98.9 F (37.2 C) (12/26 0914) Pulse Rate:  [94-110] 99  (12/26 0914) Resp:  [16-19] 16  (12/26 0914) BP: (144-184)/(63-86) 175/86 mmHg (12/26 0914) SpO2:  [93 %-99 %] 99 % (12/26 0914) Weight:  [54.5 kg (120 lb 2.4 oz)] 120 lb 2.4 oz (54.5 kg) (12/25 2153) Weight change: -10.6 kg (-23 lb 5.9 oz)  Intake/Output from previous day: 12/25 0701 - 12/26 0700 In: 360 [P.O.:360] Out: -  Intake/Output this shift: Total I/O In: 120 [P.O.:120] Out: -   General appearance: alert, cooperative and pale Resp: diminished breath sounds bibasilar and rhonchi bibasilar Cardio: S1, S2 normal and systolic murmur: holosystolic 2/6, blowing at apex GI: soft, nontender, liver dowm 3 cm Skin: bruises  Lab Results:  Basename 10/29/11 0500 10/27/11 1127  WBC 11.1* 17.4*  HGB 7.5* 7.3*  HCT 24.1* 23.6*  PLT 313 364   BMET:  Basename 10/29/11 0500 10/27/11 1127  NA 135 129*  K 2.8* 3.5  CL 95* 89*  CO2 31 27  GLUCOSE 109* 303*  BUN 18 31*  CREATININE 3.12* 3.63*  CALCIUM 9.8 9.1   No results found for this basename: PTH:2 in the last 72 hours Iron Studies: No results found for this basename: IRON,TIBC,TRANSFERRIN,FERRITIN in the last 72 hours  Studies/Results: No results found.  I have reviewed the patient's current medications.  Assessment/Plan: 1 ESRD, needs perm access.  WBC down, ok to go ahead. 2 DM good control 3 DVT enox 4 Copd 5 Anemia stable  P Hd, epo, perm access    LOS: 18 days   Merek Niu L 10/29/2011,10:47 AM

## 2011-10-29 NOTE — Progress Notes (Signed)
Occupational Therapy Evaluation Patient Details Name: [name] [name] MRN: [id number] DOB: [birth year] Today's Date: 10/29/2011 8:39-8:57  evII Problem List:  Patient Active Problem List  Diagnoses  . Diabetes mellitus  . Peripheral arterial disease  . Hypercholesterolemia  . Hypothyroidism  . Mycotic toenails  . ARF (acute respiratory failure)  . Pleural effusion, bilateral  . Hyperkalemia  . AKI (acute kidney injury)  . CKD (chronic kidney disease) stage 5, GFR less than 15 ml/min  . DM (diabetes mellitus)  . COPD with acute exacerbation  . HTN (hypertension)  . Anemia  . Cardiac enzymes elevated    Past Medical History:  Past Medical History  Diagnosis Date  . Diabetes mellitus   . Hypercholesterolemia   . Hypothyroidism   . Colitis   . Chronic kidney disease   . Coronary artery disease   . Hypertension   . GERD (gastroesophageal reflux disease)   . Myocardial infarction   . Peripheral vascular disease   . COPD (chronic obstructive pulmonary disease)   . Pneumonia    Past Surgical History:  Past Surgical History  Procedure Date  . Partial hysterectomy   . Tubal ligation   . Amputaton of right fifth toe   . Tonsillectomy   . Insertion of dialysis catheter 10/23/2011    Procedure: INSERTION OF DIALYSIS CATHETER;  Surgeon: Chuck Hint, MD;  Location: [facility];  Service: Vascular;  Laterality: Right;  Right internal jugular Insertion diatek catheter    OT Assessment/Plan/Recommendation OT Assessment Clinical Impression Statement: 70 year old female with history of respiratory failure and new renal failure.  Pt presents with limited endurance and dependency on O2 for all activities.  Currently requires min assist to perform basic selfcare tasks and simulated toileting.  Will benefit from comprehensive OT services to address these issues in order to return to modified independent level needed for home.  Based on current level and anticipated progress, feel pt will  likely need SNF for short term rehab prior to home. OT Recommendation/Assessment: Patient will need skilled OT in the acute care venue OT Problem List: Decreased strength;Decreased activity tolerance;Decreased knowledge of use of DME or AE;Cardiopulmonary status limiting activity Barriers to Discharge: Decreased caregiver support OT Therapy Diagnosis : Generalized weakness OT Plan OT Frequency: Min 2X/week OT Treatment/Interventions: Self-care/ADL training;Energy conservation;DME and/or AE instruction;Therapeutic activities;Balance training;Patient/family education OT Recommendation Follow Up Recommendations: Skilled nursing facility Equipment Recommended: Defer to next venue Individuals Consulted Consulted and Agree with Results and Recommendations: Patient OT Goals Acute Rehab OT Goals OT Goal Formulation: With patient Time For Goal Achievement: 2 weeks ADL Goals Pt Will Perform Grooming: with modified independence;Standing at sink ADL Goal: Grooming - Progress: Progressing toward goals Pt Will Perform Lower Body Bathing: with supervision;Sit to stand from bed ADL Goal: Lower Body Bathing - Progress: Progressing toward goals Pt Will Perform Lower Body Dressing: with supervision;Sit to stand from bed ADL Goal: Lower Body Dressing - Progress: Progressing toward goals Pt Will Transfer to Toilet: with supervision;Ambulation;3-in-1 ADL Goal: Toilet Transfer - Progress: Progressing toward goals Pt Will Perform Toileting - Clothing Manipulation: with supervision;Sitting on 3-in-1 or toilet;Standing ADL Goal: Toileting - Clothing Manipulation - Progress: Progressing toward goals Pt Will Perform Toileting - Hygiene: with supervision;Sit to stand from 3-in-1/toilet ADL Goal: Toileting - Hygiene - Progress: Progressing toward goals Miscellaneous OT Goals Miscellaneous OT Goal #1: Pt will independently verablize at least 2 energy conservation strategies for selfcare tasks. OT Goal:  Miscellaneous Goal #1 - Progress: Progressing toward goals  OT  Evaluation Precautions/Restrictions  Precautions Precautions: Fall Required Braces or Orthoses: No Restrictions Weight Bearing Restrictions: No Other Position/Activity Restrictions: vitals stable with mobility. On 2lpm O2 via [place]. No shortness of breath or desating with mobility today. Prior Functioning Home Living Lives With: Son (works 1 PM to 1 AM) Receives Help From: Family (though must be modified independent to dc home) Type of Home: House Home Layout: One level Home Access: Stairs to enter Entrance Stairs-Rails: Doctor, general practice of Steps: 5 Bathroom Shower/Tub: Health visitor: Standard Bathroom Accessibility: Yes Home Adaptive Equipment: Bedside commode/3-in-1;Shower chair with back (to be determined) Additional Comments: Must  be modified Independent to dc home; will need a rehab stay Prior Function Level of Independence: Independent with basic ADLs;Independent with transfers Vocation: Retired ADL ADL Eating/Feeding: Simulated;Independent Where Assessed - Eating/Feeding: Edge of bed Grooming: Performed;Minimal assistance Where Assessed - Grooming: Standing at sink Upper Body Bathing: Simulated;Set up Where Assessed - Upper Body Bathing: Sitting, bed;Unsupported Lower Body Bathing: Simulated;Minimal assistance Where Assessed - Lower Body Bathing: Sit to stand from bed Upper Body Dressing: Simulated;Set up Where Assessed - Upper Body Dressing: Unsupported;Sitting, bed Lower Body Dressing: Minimal assistance Where Assessed - Lower Body Dressing: Sit to stand from bed Toilet Transfer: Simulated;Minimal assistance Toilet Transfer Method: Stand pivot Toilet Transfer Equipment: Bedside commode Toileting - Clothing Manipulation: Simulated;Minimal assistance Where Assessed - Toileting Clothing Manipulation: Sit to stand from 3-in-1 or toilet Toileting - Hygiene:  Simulated;Minimal assistance Where Assessed - Toileting Hygiene: Sit to stand from 3-in-1 or toilet Tub/Shower Transfer: Not assessed Tub/Shower Transfer Method: Not assessed ADL Comments: Pt with limited ADL function secondary to fatigue.  Only tolerated standing at the sink for approximately 1 min before wanting to sit back down.  Declined walking into the bathroom to attempt a toilet transfer. Vision/Perception  Vision - History Baseline Vision: No visual deficits Patient Visual Report: No change from baseline Cognition Cognition Arousal/Alertness: Awake/alert Overall Cognitive Status: Appears within functional limits for tasks assessed Orientation Level: Oriented X4 Sensation/Coordination Sensation Light Touch: Appears Intact Stereognosis: Appears Intact Hot/Cold: Appears Intact Proprioception: Appears Intact Extremity Assessment RUE Assessment RUE Assessment: Within Functional Limits LUE Assessment LUE Assessment: Within Functional Limits Mobility  Bed Mobility Bed Mobility: Yes Supine to Sit: 5: Supervision;HOB elevated (Comment degrees) (HOB 35 degrees) Sitting - Scoot to Edge of Bed: 5: Supervision Transfers Transfers: Yes Sit to Stand: 4: Min assist;With upper extremity assist;From bed Exercises   End of Session OT - End of Session Activity Tolerance: Patient limited by fatigue Patient left: in bed;with call bell in reach;with bed alarm set General Behavior During Session: [facility] for tasks performed Cognition: [facility] for tasks performed   Reathel Turi OTR/L 10/29/2011, 9:15 AM  Pager number [phone]

## 2011-10-29 NOTE — Progress Notes (Signed)
PATIENT DETAILS Name: [name] [name] Age: 70 y.o. Sex: female Date of Birth: [birth year] Admit Date: 10/11/2011 PCP:No primary provider on file.  Subjective: She feels fine, denied any diarrhea or abdominal pain.  Objective: Vital signs in last 24 hours: Filed Vitals:   10/28/11 2153 10/29/11 0643 10/29/11 0852 10/29/11 0914  BP: 144/72 144/63 161/81 175/86  Pulse: 110 94 99 99  Temp: 98.8 F (37.1 C) 97.4 F (36.3 C)  98.9 F (37.2 C)  TempSrc: Oral Oral  Oral  Resp: 19 18  16   Height:      Weight: 54.5 kg (120 lb 2.4 oz)     SpO2: 95% 96% 99% 99%    Weight change: -10.6 kg (-23 lb 5.9 oz)  Body mass index is 19.99 kg/(m^2).  Intake/Output from previous day:  Intake/Output Summary (Last 24 hours) at 10/29/11 1047 Last data filed at 10/29/11 0900  Gross per 24 hour  Intake    120 ml  Output      0 ml  Net    120 ml    PHYSICAL EXAM: Gen Exam: Awake and alert with clear speech.   Neck: Supple, No JVD.   Chest: B/L Clear.  No added sounds CVS: S1 S2 Regular, no murmurs.  Abdomen: soft, BS +, non tender, non distended.  Extremities: no edema, lower extremities warm to touch. Neurologic: Non Focal.   Skin: No Rash.   Wounds: N/A.    CONSULTS:  nephrology  LAB RESULTS: CBC  Lab 10/29/11 0500 10/27/11 1127 10/26/11 0615 10/25/11 0700 10/24/11 0439  WBC 11.1* 17.4* 18.0* 19.8* 21.5*  HGB 7.5* 7.3* 7.1* 7.2* 7.7*  HCT 24.1* 23.6* 22.5* 23.3* 24.6*  PLT 313 364 368 355 382  MCV 89.9 86.8 86.2 85.7 82.8  MCH 28.0 26.8 27.2 26.5 25.9*  MCHC 31.1 30.9 31.6 30.9 31.3  RDW 23.3* 20.2* 18.4* 18.1* 17.7*  LYMPHSABS -- -- -- -- --  MONOABS -- -- -- -- --  EOSABS -- -- -- -- --  BASOSABS -- -- -- -- --  BANDABS -- -- -- -- --    Chemistries   Lab 10/29/11 0500 10/27/11 1127 10/25/11 0810 10/24/11 0430 10/23/11 0430  NA 135 129* 138 138 135  K 2.8* 3.5 3.5 3.8 3.6  CL 95* 89* 99 97 93*  CO2 31 27 27 28 27   GLUCOSE 109* 303* 102* 137* 56*  BUN 18 31* 25* 29* 68*   CREATININE 3.12* 3.63* 2.45* 2.54* 4.44*  CALCIUM 9.8 9.1 9.2 9.0 9.4  MG -- -- -- -- --   MEDICATIONS: Scheduled Meds:    . ipratropium  0.5 mg Nebulization TID   And  . albuterol  2.5 mg Nebulization TID  . aspirin  81 mg Oral Daily  . calcium acetate  667 mg Oral TID WC  . clopidogrel  75 mg Oral Q breakfast  . darbepoetin (ARANESP) injection - DIALYSIS  200 mcg Intravenous Q Thu-HD  . docusate sodium  100 mg Oral BID  . enoxaparin (LOVENOX) injection  55 mg Subcutaneous Daily  . famotidine  20 mg Oral Daily  . ferric glucontate (NULECIT) IV  125 mg Intravenous Q M,W,F-HD  . guaiFENesin  600 mg Oral BID  . insulin aspart  0-20 Units Subcutaneous TID WC  . insulin aspart  0-5 Units Subcutaneous QHS  . insulin glargine  14 Units Subcutaneous QHS  . isosorbide mononitrate  120 mg Oral Daily  . levothyroxine  25 mcg Oral Q0600  . multivitamin  1 tablet Oral QHS  . potassium chloride  40 mEq Oral Once  . vancomycin  125 mg Oral QID  . DISCONTD: darbepoetin (ARANESP) injection - DIALYSIS  200 mcg Intravenous Q Tue-HD   Continuous Infusions:  PRN Meds:.sodium chloride, sodium chloride, sodium chloride, acetaminophen, acetaminophen, ALPRAZolam, alteplase, calcium carbonate (dosed in mg elemental calcium), docusate sodium, feeding supplement (NEPRO CARB STEADY), heparin, heparin, heparin, heparin, heparin, lidocaine, lidocaine-prilocaine, morphine injection, nitroGLYCERIN, ondansetron (ZOFRAN) IV, ondansetron (ZOFRAN) IV, ondansetron, ondansetron, oxyCODONE, pentafluoroprop-tetrafluoroeth promethazine, sodium chloride, zolpidem, DISCONTD: sodium chloride, DISCONTD: sodium chloride, DISCONTD: feeding supplement (NEPRO CARB STEADY), DISCONTD: lidocaine, DISCONTD: lidocaine-prilocaine, DISCONTD: pentafluoroprop-tetrafluoroeth  Assessment/Plan: Patient Active Hospital Problem List:  ARF (acute respiratory failure)  Assessment: Better. Tolerating oxygen by nasal cannula now. Etiology is  multifactorial with pulmonary edema, presumed pulmonary embolism and possible pneumonia.    Plan: Would continue with oxygen as tolerated. Continues to be stable.  CKD (chronic kidney disease) stage 5,    Assessment: Now with end-stage renal disease.    Plan: Hemodialysis per renal.   DM (diabetes mellitus)    Assessment: CBGs continue to be stable.    Plan: Continue with Lantus and sliding scale insulin.    DVT with presumed pulmonary embolism   Assessment: Stable    Plan: Continue with Lovenox, permanent dialysis access is been considered before this discharge. Continue in the lower lungs probably patient will need to be on Coumadin before discharge.  HTN (hypertension)    Assessment: Controlled    Plan: Continue with Imdur.   Anemia    Assessment: Likely secondary to critical illness and end-stage renal disease.    Plan: Continue to monitor for now, on darbepoetin and intravenous iron. We'll need to transfuse if hemoglobin drops less than 7, however continues to be stable in low 7's.   Diarrhea-from C. difficile colitis   Assessment: Unchanged   Plan: Continue with empiric oral  Vancomycin for a total of 14 days, started on 10/22/2011   ESBL ESCHERICHIA coli UTI  Assessment: Afebrile, completed antibiotic course  Plan: Monitor off antibiotics.  Hypothyroidism  Assessment: Stable, TSH within normal limits.  Plan: Continue with levothyroxine.   Cardiac enzymes elevated    Assessment: Had elevated cardiac enzymes this admission. Seen by cardiology-no further workup recommended given good LV function.    Plan: Continue with antiplatelet agents.    COPD   Assessment: Stable, lungs clear on exam   Plan: Continue with albuterol and Atrovent nebs.  Deconditioning   Assessment: Secondary to prolonged hospitalization and critical illness.   Plan: Continue with PT/OT will plan to discharge to SNF.  Disposition: Remain inpatient -SNF at discharge  DVT Prophylaxis:  on  therapeutic Lovenox   Code Status:  limited code   Jackob Crookston A 10/29/2011, 10:47 AM

## 2011-10-29 NOTE — Progress Notes (Signed)
Pt has bed offers. CSW will continue to follow for d/c planning as pt progresses medically.  Baxter Flattery, MSW [phone]

## 2011-10-29 NOTE — Progress Notes (Signed)
Utilization review complete 

## 2011-10-29 NOTE — Progress Notes (Signed)
Patient ID: [name] [name], female   DOB: 03/13/1941, 70 y.o.   MRN: [id number] Vascular Surgery Progress Note  Subjective: needs access  Objective:  Filed Vitals:   10/29/11 0914  BP: 175/86  Pulse: 99  Temp: 98.9 F (37.2 C)  Resp: 16    RUE with 2-3+ radial pulse Basilic vein possibly adequate on vein mapping  Labs:  Lab 10/29/11 0500 10/27/11 1127 10/25/11 0810  CREATININE 3.12* 3.63* 2.45*    Lab 10/29/11 0500 10/27/11 1127 10/25/11 0810  NA 135 129* 138  K 2.8* 3.5 3.5  CL 95* 89* 99  CO2 31 27 27   BUN 18 31* 25*  CREATININE 3.12* 3.63* 2.45*  LABGLOM -- -- --  GLUCOSE 109* -- --  CALCIUM 9.8 9.1 9.2    Lab 10/29/11 0500 10/27/11 1127 10/26/11 0615  WBC 11.1* 17.4* 18.0*  HGB 7.5* 7.3* 7.1*  HCT 24.1* 23.6* 22.5*  PLT 313 364 368    Lab 10/28/11 0545 10/27/11 0537 10/26/11 0615  INR 1.36 2.07* 2.93*    I/O last 3 completed shifts: In: 360 [P.O.:360] Out: -   Imaging: No results found.  Assessment/Plan:    LOS: 18 days  s/p Procedure(s): INSERTION OF DIALYSIS CATHETER  Plan R Basilic vein Transposition on Friday per Dr. Arbie Cookey. Possible AVG insertion. HD tomorrow as scheduled   Josephina Gip, MD 10/29/2011 1:14 PM

## 2011-10-30 ENCOUNTER — Inpatient Hospital Stay (HOSPITAL_COMMUNITY): Payer: Medicare Other

## 2011-10-30 LAB — COMPREHENSIVE METABOLIC PANEL
ALT: 11 U/L (ref 0–35)
AST: 15 U/L (ref 0–37)
Albumin: 2.9 g/dL — ABNORMAL LOW (ref 3.5–5.2)
CO2: 30 mEq/L (ref 19–32)
Chloride: 91 mEq/L — ABNORMAL LOW (ref 96–112)
GFR calc non Af Amer: 10 mL/min — ABNORMAL LOW (ref >90)
Potassium: 4 mEq/L (ref 3.5–5.1)
Sodium: 135 mEq/L (ref 135–145)
Total Bilirubin: 0.3 mg/dL (ref 0.3–1.2)

## 2011-10-30 LAB — GLUCOSE, CAPILLARY
Glucose-Capillary: 164 mg/dL — ABNORMAL HIGH (ref 70–99)
Glucose-Capillary: 143 mg/dL — ABNORMAL HIGH (ref 70–99)
Glucose-Capillary: 169 mg/dL — ABNORMAL HIGH (ref 70–99)
Glucose-Capillary: 130 mg/dL — ABNORMAL HIGH (ref 70–99)

## 2011-10-30 LAB — CBC
Platelets: 318 10*3/uL (ref 150–400)
RBC: 3.01 MIL/uL — ABNORMAL LOW (ref 3.87–5.11)
WBC: 11.4 10*3/uL — ABNORMAL HIGH (ref 4.0–10.5)

## 2011-10-30 MED ORDER — NA FERRIC GLUC CPLX IN SUCROSE 12.5 MG/ML IV SOLN: 125.0000 mg | INTRAVENOUS | Status: DC

## 2011-10-30 MED ORDER — SODIUM CHLORIDE 0.9 % IJ SOLN
125.0000 mg | INTRAVENOUS | Status: DC
  Administered 2011-10-30 – 2011-11-01 (×2): 125 mg via INTRAVENOUS
  Filled 2011-10-30 (×2): qty 10

## 2011-10-30 MED ORDER — DARBEPOETIN ALFA-POLYSORBATE 200 MCG/0.4ML IJ SOLN
INTRAMUSCULAR | Status: AC
  Administered 2011-10-30: 200 ug via INTRAVENOUS
  Filled 2011-10-30: qty 0.4

## 2011-10-30 MED ORDER — CEFAZOLIN SODIUM 1-5 GM-% IV SOLN
1.0000 g | INTRAVENOUS | Status: AC
  Administered 2011-10-31: 1 g via INTRAVENOUS
  Filled 2011-10-30: qty 50

## 2011-10-30 NOTE — Progress Notes (Signed)
Pt off unit

## 2011-10-30 NOTE — Progress Notes (Signed)
FL2 faxed out to area SNF's- anticipate offers for d/c when stable- plans for HD access to be placed prior to d/c. Reece Levy, MSW, Theresia Majors [phone]

## 2011-10-30 NOTE — Progress Notes (Signed)
ANTICOAGULATION CONSULT NOTE - Follow Up Consult  Pharmacy Consult for Lovenox Indication: DVT  Allergies  Allergen Reactions  . Novocain Other (See Comments)    Unknown    Patient Measurements: Height: 5\' 5"  (165.1 cm) Weight: 121 lb 14.6 oz (55.3 kg) IBW/kg (Calculated) : 57  Adjusted Body Weight:   Vital Signs: Temp: 98.3 F (36.8 C) (12/27 0924) Temp src: Oral (12/27 0924) BP: 176/85 mmHg (12/27 0924) Pulse Rate: 114  (12/27 0924)  Labs:  Basename 10/29/11 0500 10/28/11 0545 10/27/11 1127  HGB 7.5* -- 7.3*  HCT 24.1* -- 23.6*  PLT 313 -- 364  APTT -- -- --  LABPROT -- 17.0* --  INR -- 1.36 --  HEPARINUNFRC -- -- --  CREATININE 3.12* -- 3.63*  CKTOTAL -- -- --  CKMB -- -- --  TROPONINI -- -- --   Estimated Creatinine Clearance: 15.1 ml/min (by C-G formula based on Cr of 3.12).   Medications:  Scheduled:     . ipratropium  0.5 mg Nebulization TID   And  . albuterol  2.5 mg Nebulization TID  . aspirin  81 mg Oral Daily  . calcium acetate  667 mg Oral TID WC  . ceFAZolin (ANCEF) IV  1 g Intravenous On Call to OR  . clopidogrel  75 mg Oral Q breakfast  . darbepoetin (ARANESP) injection - DIALYSIS  200 mcg Intravenous Q Thu-HD  . docusate sodium  100 mg Oral BID  . enoxaparin (LOVENOX) injection  55 mg Subcutaneous Daily  . famotidine  20 mg Oral Daily  . ferric glucontate (NULECIT) IV  125 mg Intravenous Q T,Th,Sa-HD  . guaiFENesin  600 mg Oral BID  . insulin aspart  0-20 Units Subcutaneous TID WC  . insulin aspart  0-5 Units Subcutaneous QHS  . insulin glargine  14 Units Subcutaneous QHS  . isosorbide mononitrate  120 mg Oral Daily  . levothyroxine  25 mcg Oral Q0600  . multivitamin  1 tablet Oral QHS  . potassium chloride  40 mEq Oral Once  . vancomycin  125 mg Oral QID  . DISCONTD: ferric glucontate (NULECIT) IV  125 mg Intravenous Q M,W,F-HD   Assessment:   Anticoagulation: RLE DVT and presumed PE. Per notes, permanent dialysis access is being  considered before D/C, probable surgery 12/28 with plan to add Coumadin after surgery.  MD aware pt on HD.    Infectious Disease: Day#8 Vanc 125 mg PO qid for C diff infection., afebrile, WBC 11.1.  S/p completed course Primaxin for E.coli UTI. Cardiovascular: HTN/CAD: 176-185/73-85, HR 108-114 - on home ASA, Plavix, Imdur Endocrinology: Levothyroxine per home dose.  Gastrointestinal / Nutrition: colitis, CBGs 108-268, on Lantus, SSI, bedtime coverage, Nephrology: ESRD on HD TTS - on Phoslo, Renavite,  MD to transfuse if hgb < 7.  Pulmonary: COPD stable. 97% on 3L.  Meds:  guaifenesin, atrovent/albuterol Hematology / Oncology: darbe 200 (q thurs), nulecit 125 q HD.  Hgb 7.5 Best Practices: Lovenox, Pepcid, home meds addressed.  Goal of Therapy:  Anti-Xa = 0.6-1.2   Plan:  1. Continue lovenox 55mg  SQ q24h (1mg /kg q 24 h d/t CrCl < 51ml/min) and await order to start coumadin after line placement, possibly tomorrow.  2. CBC q72h   Brallan Denio E 10/30/2011,11:09 AM

## 2011-10-30 NOTE — Progress Notes (Signed)
HEMODIALYSIS-See flowsheet. Pt signed off treatment early d/t pain from sitting in recliner. Unable to achieve UF goal d/t bp issues and cramping. Pt tolerated recliner until last hour of treatment. Dr. Caryn Section aware. AMA sheet located in chart.

## 2011-10-30 NOTE — Progress Notes (Signed)
PATIENT DETAILS Name: [name] [name] Age: 70 y.o. Sex: female Date of Birth: [birth year] Admit Date: 10/11/2011 PCP:No primary provider on file.  Subjective: No changes clinically, denies any shortness of breath, fever or chills.  Objective: Vital signs in last 24 hours: Filed Vitals:   10/29/11 2137 10/30/11 0440 10/30/11 0816 10/30/11 0924  BP:  185/73  176/85  Pulse:  108  114  Temp:  98.8 F (37.1 C)  98.3 F (36.8 C)  TempSrc:  Oral  Oral  Resp:  20  17  Height:      Weight:      SpO2: 100% 98% 97% 97%    Weight change: 0.8 kg (1 lb 12.2 oz)  Body mass index is 20.29 kg/(m^2).  Intake/Output from previous day:  Intake/Output Summary (Last 24 hours) at 10/30/11 1028 Last data filed at 10/30/11 0900  Gross per 24 hour  Intake    480 ml  Output      0 ml  Net    480 ml    PHYSICAL EXAM: Gen Exam: Awake and alert with clear speech.   Neck: Supple, No JVD.   Chest: B/L Clear.  No added sounds CVS: S1 S2 Regular, no murmurs.  Abdomen: soft, BS +, non tender, non distended.  Extremities: no edema, lower extremities warm to touch. Neurologic: Non Focal.   Skin: No Rash.   Wounds: N/A.    CONSULTS:  nephrology  LAB RESULTS: CBC  Lab 10/29/11 0500 10/27/11 1127 10/26/11 0615 10/25/11 0700 10/24/11 0439  WBC 11.1* 17.4* 18.0* 19.8* 21.5*  HGB 7.5* 7.3* 7.1* 7.2* 7.7*  HCT 24.1* 23.6* 22.5* 23.3* 24.6*  PLT 313 364 368 355 382  MCV 89.9 86.8 86.2 85.7 82.8  MCH 28.0 26.8 27.2 26.5 25.9*  MCHC 31.1 30.9 31.6 30.9 31.3  RDW 23.3* 20.2* 18.4* 18.1* 17.7*  LYMPHSABS -- -- -- -- --  MONOABS -- -- -- -- --  EOSABS -- -- -- -- --  BASOSABS -- -- -- -- --  BANDABS -- -- -- -- --    Chemistries   Lab 10/29/11 0500 10/27/11 1127 10/25/11 0810 10/24/11 0430  NA 135 129* 138 138  K 2.8* 3.5 3.5 3.8  CL 95* 89* 99 97  CO2 31 27 27 28   GLUCOSE 109* 303* 102* 137*  BUN 18 31* 25* 29*  CREATININE 3.12* 3.63* 2.45* 2.54*  CALCIUM 9.8 9.1 9.2 9.0  MG -- -- -- --    MEDICATIONS: Scheduled Meds:    . ipratropium  0.5 mg Nebulization TID   And  . albuterol  2.5 mg Nebulization TID  . aspirin  81 mg Oral Daily  . calcium acetate  667 mg Oral TID WC  . clopidogrel  75 mg Oral Q breakfast  . darbepoetin (ARANESP) injection - DIALYSIS  200 mcg Intravenous Q Thu-HD  . docusate sodium  100 mg Oral BID  . enoxaparin (LOVENOX) injection  55 mg Subcutaneous Daily  . famotidine  20 mg Oral Daily  . ferric glucontate (NULECIT) IV  125 mg Intravenous Q T,Th,Sa-HD  . guaiFENesin  600 mg Oral BID  . insulin aspart  0-20 Units Subcutaneous TID WC  . insulin aspart  0-5 Units Subcutaneous QHS  . insulin glargine  14 Units Subcutaneous QHS  . isosorbide mononitrate  120 mg Oral Daily  . levothyroxine  25 mcg Oral Q0600  . multivitamin  1 tablet Oral QHS  . potassium chloride  40 mEq Oral Once  . vancomycin  125 mg Oral QID  . DISCONTD: ferric glucontate (NULECIT) IV  125 mg Intravenous Q M,W,F-HD   Continuous Infusions:  PRN Meds:.sodium chloride, sodium chloride, sodium chloride, acetaminophen, acetaminophen, ALPRAZolam, alteplase, calcium carbonate (dosed in mg elemental calcium), docusate sodium, feeding supplement (NEPRO CARB STEADY), heparin, heparin, heparin, heparin, lidocaine, lidocaine-prilocaine, morphine injection, nitroGLYCERIN, ondansetron (ZOFRAN) IV, ondansetron (ZOFRAN) IV, ondansetron, ondansetron, oxyCODONE, pentafluoroprop-tetrafluoroeth, promethazine sodium chloride, zolpidem, DISCONTD: sodium chloride, DISCONTD: sodium chloride, DISCONTD: feeding supplement (NEPRO CARB STEADY), DISCONTD: heparin, DISCONTD: lidocaine, DISCONTD: lidocaine-prilocaine, DISCONTD: pentafluoroprop-tetrafluoroeth  Assessment/Plan: Patient Active Hospital Problem List:  ARF (acute respiratory failure)  Assessment: Better. Tolerating oxygen by nasal cannula now. Etiology is multifactorial with pulmonary edema, presumed pulmonary embolism and possible pneumonia.     Plan: Would continue with oxygen as tolerated. Continues to be stable.  CKD (chronic kidney disease) stage 5,    Assessment: Now with end-stage renal disease.    Plan: Hemodialysis per renal. Dialysis access to be placed tomorrow morning by Dr. Arbie Cookey. Patient currently getting her dialysis through tunnel catheter.  DM (diabetes mellitus)    Assessment: CBGs continue to be stable.    Plan: Continue with Lantus and sliding scale insulin.    DVT with presumed pulmonary embolism   Assessment: Stable    Plan: Continue with Lovenox, permanent dialysis access is been considered before this discharge. Continue Lovenox tomorrow after surgery Coumadin can be added.   HTN (hypertension)    Assessment: Controlled, has some tachycardia some time.    Plan: Continue with Imdur. If tachycardia continues to be well add beta blockers or calcium channel blocker.  Anemia    Assessment: Likely secondary to critical illness and end-stage renal disease.    Plan: Continue to monitor for now, on darbepoetin and intravenous iron. We'll need to transfuse if hemoglobin drops less than 7, however continues to be stable in low 7's.   Diarrhea-from C. difficile colitis   Assessment: Unchanged   Plan: Continue with empiric oral  Vancomycin for a total of 14 days, started on 10/22/2011   ESBL ESCHERICHIA coli UTI  Assessment: Afebrile, completed antibiotic course  Plan: Monitor off antibiotics.  Hypothyroidism  Assessment: Stable, TSH within normal limits.  Plan: Continue with levothyroxine.   Cardiac enzymes elevated    Assessment: Had elevated cardiac enzymes this admission. Seen by cardiology-no further workup recommended given good LV function.    Plan: Continue with antiplatelet agents.    COPD   Assessment: Stable, lungs clear on exam   Plan: Continue with albuterol and Atrovent nebs.  Deconditioning   Assessment: Secondary to prolonged hospitalization and critical illness.   Plan: Continue with  PT/OT will plan to discharge to SNF.  Disposition: Remain inpatient -SNF at discharge  DVT Prophylaxis:  on therapeutic Lovenox   Code Status: Limited code.  Zailey Audia A 10/30/2011, 10:28 AM

## 2011-10-30 NOTE — Progress Notes (Addendum)
On HD via R IJ tunneled cath.  Still has 3 lumen cath in L IJ  BP 112/69, Goal 2.8L.  Hgb 7.5.  On aranesp 200/wk.    Fe/TIBC 20%, ferritin 172 on 18 Dec(IV Fe started today).   Ca/Phos 9.1/2.3 on 18 Dec on PhosLo 1 TID. PTH 367.5   Will begin Zemplar 4 mcg each HD Approved for outpt. HD ([facility] office) [place] T-Th-Sat PM She lives in [place] is closest dialysis center Will also need permanent access-- vein mapping for  possible AVF done on 10 Dec.  Consult requested from [facility] W[facility] Readings from Last 3 Encounters:  10/30/11 63.9 kg (140 lb 14 oz)  10/30/11 63.9 kg (140 lb 14 oz)  10/30/11 63.9 kg (140 lb 14 oz)

## 2011-10-31 ENCOUNTER — Encounter (HOSPITAL_COMMUNITY)
Admission: AD | Disposition: A | Discharge status: Skilled Nursing Facility | Payer: Self-pay | Source: Other Acute Inpatient Hospital | Attending: Pulmonary Disease

## 2011-10-31 ENCOUNTER — Encounter (HOSPITAL_COMMUNITY): Payer: Self-pay | Admitting: Anesthesiology

## 2011-10-31 ENCOUNTER — Inpatient Hospital Stay (HOSPITAL_COMMUNITY): Payer: Medicare Other | Admitting: Anesthesiology

## 2011-10-31 DIAGNOSIS — N186 End stage renal disease: Secondary | ICD-10-CM

## 2011-10-31 LAB — CBC
HCT: 25.6 % — ABNORMAL LOW (ref 36.0–46.0)
MCH: 27.5 pg (ref 26.0–34.0)
MCV: 91.4 fL (ref 78.0–100.0)
Platelets: 265 10*3/uL (ref 150–400)
RDW: 25 % — ABNORMAL HIGH (ref 11.5–15.5)

## 2011-10-31 LAB — BASIC METABOLIC PANEL
BUN: 12 mg/dL (ref 6–23)
CO2: 28 mEq/L (ref 19–32)
Calcium: 8.8 mg/dL (ref 8.4–10.5)
Creatinine, Ser: 2.53 mg/dL — ABNORMAL HIGH (ref 0.50–1.10)
GFR calc Af Amer: 21 mL/min — ABNORMAL LOW (ref >90)

## 2011-10-31 LAB — GLUCOSE, CAPILLARY: Glucose-Capillary: 194 mg/dL — ABNORMAL HIGH (ref 70–99)

## 2011-10-31 SURGERY — TRANSPOSITION, VEIN, BASILIC
Anesthesia: General | Site: Arm Upper | Laterality: Right | Wound class: Clean

## 2011-10-31 MED ORDER — PARICALCITOL 5 MCG/ML IV SOLN
2.0000 ug | INTRAVENOUS | Status: DC
  Administered 2011-11-01: 2 ug via INTRAVENOUS
  Filled 2011-10-31: qty 0.4

## 2011-10-31 MED ORDER — NEPRO/CARBSTEADY PO LIQD: 237.0000 mL | ORAL | Status: DC | PRN

## 2011-10-31 MED ORDER — PHENYLEPHRINE HCL 10 MG/ML IJ SOLN
10.0000 mg | INTRAVENOUS | Status: DC | PRN
  Administered 2011-10-31: 40 ug/min via INTRAVENOUS

## 2011-10-31 MED ORDER — PENTAFLUOROPROP-TETRAFLUOROETH EX AERO: 1.0000 "application " | INHALATION_SPRAY | CUTANEOUS | Status: DC | PRN

## 2011-10-31 MED ORDER — NEOSTIGMINE METHYLSULFATE 1 MG/ML IJ SOLN
INTRAMUSCULAR | Status: DC | PRN
  Administered 2011-10-31: 3 mg via INTRAVENOUS

## 2011-10-31 MED ORDER — 0.9 % SODIUM CHLORIDE (POUR BTL) OPTIME
TOPICAL | Status: DC | PRN
  Administered 2011-10-31: 1000 mL

## 2011-10-31 MED ORDER — ONDANSETRON HCL 4 MG/2ML IJ SOLN: 4.0000 mg | Freq: Once | INTRAMUSCULAR | Status: DC | PRN

## 2011-10-31 MED ORDER — INSULIN GLARGINE 100 UNIT/ML ~~LOC~~ SOLN
18.0000 [IU] | Freq: Every day | SUBCUTANEOUS | Status: DC
  Administered 2011-10-31: 18 [IU] via SUBCUTANEOUS

## 2011-10-31 MED ORDER — WARFARIN SODIUM 2.5 MG PO TABS
2.5000 mg | ORAL_TABLET | Freq: Once | ORAL | Status: AC
  Administered 2011-10-31: 2.5 mg via ORAL
  Filled 2011-10-31: qty 1

## 2011-10-31 MED ORDER — ROCURONIUM BROMIDE 100 MG/10ML IV SOLN
INTRAVENOUS | Status: DC | PRN
  Administered 2011-10-31: 35 mg via INTRAVENOUS

## 2011-10-31 MED ORDER — HYDROMORPHONE HCL PF 1 MG/ML IJ SOLN: 0.2500 mg | INTRAMUSCULAR | Status: DC | PRN

## 2011-10-31 MED ORDER — HEPARIN SODIUM (PORCINE) 1000 UNIT/ML DIALYSIS
20.0000 [IU]/kg | INTRAMUSCULAR | Status: DC | PRN
  Administered 2011-11-01: 1100 [IU] via INTRAVENOUS_CENTRAL
  Filled 2011-10-31: qty 2

## 2011-10-31 MED ORDER — SODIUM CHLORIDE 0.9 % IV SOLN: 100.0000 mL | INTRAVENOUS | Status: DC | PRN

## 2011-10-31 MED ORDER — PROPOFOL 10 MG/ML IV EMUL
INTRAVENOUS | Status: DC | PRN
  Administered 2011-10-31: 30 mg via INTRAVENOUS
  Administered 2011-10-31: 100 mg via INTRAVENOUS

## 2011-10-31 MED ORDER — COUMADIN BOOK
Freq: Once | Status: AC
  Administered 2011-10-31: 15:00:00
  Filled 2011-10-31: qty 1

## 2011-10-31 MED ORDER — FENTANYL CITRATE 0.05 MG/ML IJ SOLN
INTRAMUSCULAR | Status: DC | PRN
  Administered 2011-10-31: 75 ug via INTRAVENOUS

## 2011-10-31 MED ORDER — ALTEPLASE 2 MG IJ SOLR
2.0000 mg | Freq: Once | INTRAMUSCULAR | Status: AC | PRN
  Filled 2011-10-31: qty 2

## 2011-10-31 MED ORDER — HEPARIN SODIUM (PORCINE) 1000 UNIT/ML DIALYSIS
1000.0000 [IU] | INTRAMUSCULAR | Status: DC | PRN
  Filled 2011-10-31: qty 1

## 2011-10-31 MED ORDER — SODIUM CHLORIDE 0.9 % IR SOLN
Status: DC | PRN
  Administered 2011-10-31: 08:00:00

## 2011-10-31 MED ORDER — WARFARIN VIDEO: Freq: Once | Status: DC

## 2011-10-31 MED ORDER — LIDOCAINE HCL (PF) 1 % IJ SOLN: 5.0000 mL | INTRAMUSCULAR | Status: DC | PRN

## 2011-10-31 MED ORDER — GLYCOPYRROLATE 0.2 MG/ML IJ SOLN
INTRAMUSCULAR | Status: DC | PRN
  Administered 2011-10-31: .4 mg via INTRAVENOUS

## 2011-10-31 MED ORDER — ONDANSETRON HCL 4 MG/2ML IJ SOLN
INTRAMUSCULAR | Status: DC | PRN
  Administered 2011-10-31: 4 mg via INTRAVENOUS

## 2011-10-31 MED ORDER — LIDOCAINE-PRILOCAINE 2.5-2.5 % EX CREA: 1.0000 "application " | TOPICAL_CREAM | CUTANEOUS | Status: DC | PRN

## 2011-10-31 MED ORDER — OXYCODONE-ACETAMINOPHEN 5-325 MG PO TABS: 1.0000 | ORAL_TABLET | ORAL | Status: DC | PRN

## 2011-10-31 MED ORDER — SODIUM CHLORIDE 0.9 % IV SOLN
INTRAVENOUS | Status: DC | PRN
  Administered 2011-10-31: 07:00:00 via INTRAVENOUS

## 2011-10-31 SURGICAL SUPPLY — 38 items
CANISTER SUCTION 2500CC (MISCELLANEOUS) ×2 IMPLANT
CLIP TI MEDIUM 6 (CLIP) ×2 IMPLANT
CLIP TI WIDE RED SMALL 6 (CLIP) ×4 IMPLANT
CLOTH BEACON ORANGE TIMEOUT ST (SAFETY) ×2 IMPLANT
COVER PROBE W GEL 5X96 (DRAPES) IMPLANT
COVER SURGICAL LIGHT HANDLE (MISCELLANEOUS) ×4 IMPLANT
DERMABOND ADHESIVE PROPEN (GAUZE/BANDAGES/DRESSINGS) ×1
DERMABOND ADVANCED (GAUZE/BANDAGES/DRESSINGS) ×1
DERMABOND ADVANCED .7 DNX12 (GAUZE/BANDAGES/DRESSINGS) ×1 IMPLANT
DERMABOND ADVANCED .7 DNX6 (GAUZE/BANDAGES/DRESSINGS) ×1 IMPLANT
ELECT REM PT RETURN 9FT ADLT (ELECTROSURGICAL) ×2
ELECTRODE REM PT RTRN 9FT ADLT (ELECTROSURGICAL) ×1 IMPLANT
GEL ULTRASOUND 20GR AQUASONIC (MISCELLANEOUS) IMPLANT
GLOVE BIOGEL PI IND STRL 6.5 (GLOVE) ×2 IMPLANT
GLOVE BIOGEL PI IND STRL 7.0 (GLOVE) ×1 IMPLANT
GLOVE BIOGEL PI IND STRL 7.5 (GLOVE) ×2 IMPLANT
GLOVE BIOGEL PI INDICATOR 6.5 (GLOVE) ×2
GLOVE BIOGEL PI INDICATOR 7.0 (GLOVE) ×1
GLOVE BIOGEL PI INDICATOR 7.5 (GLOVE) ×2
GLOVE SS BIOGEL STRL SZ 7 (GLOVE) ×1 IMPLANT
GLOVE SUPERSENSE BIOGEL SZ 7 (GLOVE) ×1
GLOVE SURG SS PI 7.5 STRL IVOR (GLOVE) ×4 IMPLANT
GOWN STRL NON-REIN LRG LVL3 (GOWN DISPOSABLE) ×8 IMPLANT
KIT BASIN OR (CUSTOM PROCEDURE TRAY) ×2 IMPLANT
KIT ROOM TURNOVER OR (KITS) ×2 IMPLANT
NS IRRIG 1000ML POUR BTL (IV SOLUTION) ×2 IMPLANT
PACK CV ACCESS (CUSTOM PROCEDURE TRAY) ×2 IMPLANT
PAD ARMBOARD 7.5X6 YLW CONV (MISCELLANEOUS) ×4 IMPLANT
SPONGE GAUZE 4X4 12PLY (GAUZE/BANDAGES/DRESSINGS) ×2 IMPLANT
SUT PROLENE 6 0 BV (SUTURE) ×4 IMPLANT
SUT SILK 2 0 SH (SUTURE) ×2 IMPLANT
SUT VIC AB 3-0 SH 27 (SUTURE) ×3
SUT VIC AB 3-0 SH 27X BRD (SUTURE) ×3 IMPLANT
SUT VICRYL 4-0 PS2 18IN ABS (SUTURE) ×2 IMPLANT
TOWEL OR 17X24 6PK STRL BLUE (TOWEL DISPOSABLE) ×2 IMPLANT
TOWEL OR 17X26 10 PK STRL BLUE (TOWEL DISPOSABLE) ×2 IMPLANT
UNDERPAD 30X30 INCONTINENT (UNDERPADS AND DIAPERS) ×2 IMPLANT
WATER STERILE IRR 1000ML POUR (IV SOLUTION) ×2 IMPLANT

## 2011-10-31 NOTE — Progress Notes (Signed)
Occupational Therapy Treatment Patient Details Name: [name] [name] MRN: [id number] DOB: [birth year] Today's Date: 10/31/2011 15:10-15:33  2sc OT Assessment/Plan OT Assessment/Plan Comments on Treatment Session: Pt still needs work on strengthening and increasing endurance.  Will also continue education on energy conservation strategies.  Pt will need a 3:1 for home to place over the toilet.  Has a borrowed one currently that she keeps beside her bed. OT Plan: Discharge plan remains appropriate;Equipment recommendations need to be updated OT Frequency: Min 2X/week Follow Up Recommendations: Skilled nursing facility Equipment Recommended: 3 in 1 bedside comode (If dc'd home.) OT Goals ADL Goals ADL Goal: Grooming - Progress: Progressing toward goals ADL Goal: Toilet Transfer - Progress: Met ADL Goal: Toileting - Clothing Manipulation - Progress: Met ADL Goal: Toileting - Hygiene - Progress: Met Miscellaneous OT Goals OT Goal: Miscellaneous Goal #1 - Progress: Progressing toward goals  OT Treatment Precautions/Restrictions      ADL ADL Grooming: Performed;Supervision/safety Where Assessed - Grooming: Standing at sink Toilet Transfer: Research scientist (life sciences) Method: Surveyor, minerals: Raised toilet seat with arms (or 3-in-1 over toilet) Toileting - Clothing Manipulation: Performed;Supervision/safety Where Assessed - Toileting Clothing Manipulation: Sit to stand from 3-in-1 or toilet Toileting - Hygiene: Performed Where Assessed - Toileting Hygiene: Sit to stand from 3-in-1 or toilet Equipment Used:  (3:1) ADL Comments: Pt still with decreased endurance.  Educated and provided handout on energy conservation strategies for selfcare tasks.  Tolerated stand pivot transfer to the bedside commode and standing at the sink for approximately 30 seconds to wash her hands after toileting. Mobility  Bed Mobility Bed Mobility: Yes Rolling Right: 5:  Supervision;With rail Rolling Right Details (indicate cue type and reason): HOB elevated to approximately 60 degrees. Right Sidelying to Sit: 5: Supervision;With rails;HOB elevated (comment degrees) Right Sidelying to Sit Details (indicate cue type and reason): HOB elevated to approximately 60 degrees. Transfers Transfers: Yes Sit to Stand: 5: Supervision;Without upper extremity assist;With armrests;From chair/3-in-1 Exercises    End of Session OT - End of Session Equipment Utilized During Treatment: Other (comment) (3:1) Activity Tolerance: Patient limited by fatigue Patient left: in bed;with family/visitor present General Behavior During Session: [facility] for tasks performed Cognition: [facility] for tasks performed  Nayelis Bonito OTR/L 10/31/2011, 4:02 PM Pager number [phone]

## 2011-10-31 NOTE — Transfer of Care (Signed)
Immediate Anesthesia Transfer of Care Note  Patient: [name] [name]  Procedure(s) Performed:  BASCILIC VEIN TRANSPOSITION  Patient Location: PACU  Anesthesia Type: General  Level of Consciousness: awake and patient cooperative  Airway & Oxygen Therapy: Patient Spontanous Breathing and Patient connected to nasal cannula oxygen  Post-op Assessment: Report given to PACU RN, Post -op Vital signs reviewed and stable and Patient moving all extremities  Post vital signs: Reviewed and stable  Complications: No apparent anesthesia complications

## 2011-10-31 NOTE — H&P (View-Only) (Signed)
Patient ID: [name] [name], female   DOB: 10/17/1941, 70 y.o.   MRN: [id number] Vascular Surgery Progress Note  Subjective: needs access  Objective:  Filed Vitals:   10/29/11 0914  BP: 175/86  Pulse: 99  Temp: 98.9 F (37.2 C)  Resp: 16    RUE with 2-3+ radial pulse Basilic vein possibly adequate on vein mapping  Labs:  Lab 10/29/11 0500 10/27/11 1127 10/25/11 0810  CREATININE 3.12* 3.63* 2.45*    Lab 10/29/11 0500 10/27/11 1127 10/25/11 0810  NA 135 129* 138  K 2.8* 3.5 3.5  CL 95* 89* 99  CO2 31 27 27  BUN 18 31* 25*  CREATININE 3.12* 3.63* 2.45*  LABGLOM -- -- --  GLUCOSE 109* -- --  CALCIUM 9.8 9.1 9.2    Lab 10/29/11 0500 10/27/11 1127 10/26/11 0615  WBC 11.1* 17.4* 18.0*  HGB 7.5* 7.3* 7.1*  HCT 24.1* 23.6* 22.5*  PLT 313 364 368    Lab 10/28/11 0545 10/27/11 0537 10/26/11 0615  INR 1.36 2.07* 2.93*    I/O last 3 completed shifts: In: 360 [P.O.:360] Out: -   Imaging: No results found.  Assessment/Plan:    LOS: 18 days  s/p Procedure(s): INSERTION OF DIALYSIS CATHETER  Plan R Basilic vein Transposition on Friday per Dr. Early. Possible AVG insertion. HD tomorrow as scheduled   Cashus Halterman, MD 10/29/2011 1:14 PM            

## 2011-10-31 NOTE — Preoperative (Signed)
Beta Blockers   Reason not to administer Beta Blockers:Metoprolol discontinued

## 2011-10-31 NOTE — Anesthesia Preprocedure Evaluation (Signed)
Anesthesia Evaluation  Patient identified by MRN, date of birth, ID band Patient awake    Reviewed: Allergy & Precautions, H&P , NPO status , Patient's Chart, lab work & pertinent test results  Airway Mallampati: II TM Distance: <3 FB Neck ROM: full    Dental   Pulmonary pneumonia , COPD         Cardiovascular hypertension, + CAD and + Past MI regular Normal    Neuro/Psych Negative Neurological ROS  Negative Psych ROS   GI/Hepatic Neg liver ROS, GERD-  ,  Endo/Other  Diabetes mellitus-, Poorly Controlled, Type obesity  Renal/GU   Genitourinary negative   Musculoskeletal   Abdominal   Peds  Hematology negative hematology ROS (+)   Anesthesia Other Findings   Reproductive/Obstetrics                           Anesthesia Physical Anesthesia Plan  ASA: III  Anesthesia Plan: MAC   Post-op Pain Management:    Induction:   Airway Management Planned: Simple Face Mask  Additional Equipment:   Intra-op Plan:   Post-operative Plan:   Informed Consent: I have reviewed the patients History and Physical, chart, labs and discussed the procedure including the risks, benefits and alternatives for the proposed anesthesia with the patient or authorized representative who has indicated his/her understanding and acceptance.     Plan Discussed with: Anesthesiologist, CRNA and Surgeon  Anesthesia Plan Comments:         Anesthesia Quick Evaluation

## 2011-10-31 NOTE — Progress Notes (Signed)
Physical Therapy Treatment Patient Details Name: [name] [name] MRN: [id number] DOB: 10/15/1941 Today's Date: 10/31/2011  PT Assessment/Plan  PT - Assessment/Plan Comments on Treatment Session: Pt's O2 sat remained in therapeutic range with activity however pt's HR increased from 110 at rest to 130 with activity.  RN notified.  Pt did now c/o chest pain or "heart racing"  PT Plan: Discharge plan remains appropriate;Other (comment) PT Frequency: Min 2X/week Follow Up Recommendations: Skilled nursing facility Equipment Recommended: 3 in 1 bedside comode PT Goals  Acute Rehab PT Goals PT Goal Formulation: With patient Time For Goal Achievement: 2 weeks Pt will go Supine/Side to Sit: Independently PT Goal: Supine/Side to Sit - Progress: Met Pt will go Sit to Supine/Side: Independently PT Goal: Sit to Supine/Side - Progress: Met Pt will go Sit to Stand: with min assist PT Goal: Sit to Stand - Progress: Met Pt will go Stand to Sit: with min assist PT Goal: Stand to Sit - Progress: Met Pt will Transfer Bed to Chair/Chair to Bed: with min assist PT Transfer Goal: Bed to Chair/Chair to Bed - Progress: Met Pt will Ambulate: 16 - 50 feet;with mod assist;with rolling walker PT Goal: Ambulate - Progress: Not met  PT Treatment Precautions/Restrictions  Precautions Precautions: Fall Required Braces or Orthoses: No Restrictions Weight Bearing Restrictions: No Other Position/Activity Restrictions: pt on 2 L O2 via [place]. O2 sats >90 throughout session HR increased to 130 during ambulation.  Mobility (including Balance) Bed Mobility Bed Mobility: Yes Rolling Right: 7: Independent Rolling Right Details (indicate cue type and reason): no instruction required Right Sidelying to Sit: 7: Independent;HOB flat Right Sidelying to Sit Details (indicate cue type and reason): no instruction required Supine to Sit: 7: Independent Supine to Sit Details (indicate cue type and reason): no instruction  required Sitting - Scoot to Edge of Bed: 7: Independent Sitting - Scoot to Edge of Bed Details (indicate cue type and reason): no instruction required Scooting to [facility]: Not tested (comment) Transfers Transfers: Yes Sit to Stand: 6: Modified independent (Device/Increase time);From bed;With upper extremity assist Sit to Stand Details (indicate cue type and reason): No instruction or assistance needed pt requires two attempts to stand.   Stand to Sit: 7: Independent;To bed;With upper extremity assist Stand to Sit Details: no instruction required pt demonstrates safe technique.  Stand Pivot Transfers: 6: Modified independent (Device/Increase time) Stand Pivot Transfer Details (indicate cue type and reason): modified independent with rolling walker.   Ambulation/Gait Ambulation/Gait: Yes Ambulation/Gait Assistance: 5: Supervision Ambulation/Gait Assistance Details (indicate cue type and reason): supervision for safety.  Ambulation Distance (Feet): 40 Feet Assistive device: Rolling walker Gait Pattern: Shuffle Stairs: No Wheelchair Mobility Wheelchair Mobility: No  Posture/Postural Control Posture/Postural Control: No significant limitations Balance Balance Assessed: No Exercise    End of Session PT - End of Session Equipment Utilized During Treatment: Gait belt Activity Tolerance: Patient limited by fatigue Patient left: in bed;with call bell in reach Nurse Communication: Mobility status for transfers;Mobility status for ambulation General Behavior During Session: [facility] for tasks performed Cognition: [facility] for tasks performed  Imanol Bihl 10/31/2011, 5:45 PM [place]bron Pulse L. Carmelo Reidel DPT [phone]

## 2011-10-31 NOTE — Op Note (Signed)
OPERATIVE REPORT  Date of Surgery: 10/11/2011 - 10/31/2011  Surgeon: Josephina Gip, MD  Assistant: Newton Pigg PA  Pre-op Diagnosis: end stage renal disease  Post-op Diagnosis: end stage renal disease  Procedure: Procedure(s): BASCILIC VEIN TRANSPOSITION right upper extremity  Anesthesia: Gen.  EBL: Minimal  Complications: None  Procedure Details: Patient was taken to the operating room placed in supine position at which time satisfactory general endotracheal anesthesia was administered. The right upper tourniquet was prepped with Betadine scrub and solution draped in routine sterile manner. The basilic vein had been imaged prior to prepping and draping in March and seemed adequate for basilic vein transposition. 3 separate incisions were made to mobilize the basilic vein the most distal bridging the antecubital area down into the proximal forearm. The basilic vein was satisfactory being 2-1/2-3 mm in size throughout. Its branches were ligated with 4-0 silk ties and divided. It was transected distally gently dilated with heparinized saline up to the axillary area. Brachial artery was then exposed beneath the fascia and the distal one. Had some mild atherosclerotic changes but did have an excellent pulse. The basilic vein was then carefully delivered through a subcutaneous tunnel on the anterior aspect of the upper arm being careful not to twist or kink the vein. It was delivered into the distal wound. The brachial artery was then occluded proximally and distally with vessel loops opened a 15 blade extended with Potts scissors. It was a 2-1/2-3 mm artery with excellent inflow. The vein was carefully measured spatulated and anastomosed end to side with 6-0 Prolene. Vessel loops were released there was an excellent pulse and thrill in the vein which was easily palpable beneath the skin. There was slightly diminished radial and ulnar flow with the basilic vein fistula open when it was occluded  it did improve. Adequate hemostasis was achieved and wounds were closed in layers with Vicryl in a subcuticular fashion with Dermabond patient taken to recovery in stable condition   Josephina Gip, MD 10/31/2011 9:41 AM

## 2011-10-31 NOTE — Progress Notes (Signed)
PT/OT/SLP Cancellation Note   Treatment cancelled today due to patient receiving procedure or test Pt in OR.  Emmakate Hypes L. Dashea Mcmullan DPT [phone]

## 2011-10-31 NOTE — Progress Notes (Signed)
PATIENT DETAILS Name: [name] [name] Age: 70 y.o. Sex: female Date of Birth: 1941-07-25 Admit Date: 10/11/2011 PCP:No primary provider on file.  Subjective: Denies any clinical changes. Status post his vein transposition (AVF) of the right upper extremity for permanent dialysis access   Objective: Vital signs in last 24 hours: Filed Vitals:   10/31/11 1000 10/31/11 1011 10/31/11 1023 10/31/11 1329  BP: 110/49 118/57 96/48 95/48   Pulse: 106 106 106 105  Temp:  99.2 F (37.3 C) 98.6 F (37 C) 98.8 F (37.1 C)  TempSrc:   Oral Oral  Resp: 24 26 24 20   Height:      Weight:      SpO2: 93% 94% 90% 97%    Weight change: 8.6 kg (18 lb 15.4 oz)  Body mass index is 19.81 kg/(m^2).  Intake/Output from previous day:  Intake/Output Summary (Last 24 hours) at 10/31/11 1427 Last data filed at 10/31/11 1330  Gross per 24 hour  Intake    830 ml  Output    938 ml  Net   -108 ml    PHYSICAL EXAM: Gen Exam: Awake and alert with clear speech.   Neck: Supple, No JVD.   Chest: B/L Clear.  No added sounds CVS: S1 S2 Regular, no murmurs.  Abdomen: soft, BS +, non tender, non distended.  Extremities: no edema, lower extremities warm to touch. Neurologic: Non Focal.   Skin: No Rash.   Wounds: N/A.    CONSULTS:  nephrology  LAB RESULTS: CBC  Lab 10/31/11 0535 10/30/11 1253 10/29/11 0500 10/27/11 1127 10/26/11 0615  WBC 11.8* 11.4* 11.1* 17.4* 18.0*  HGB 7.7* 8.6* 7.5* 7.3* 7.1*  HCT 25.6* 27.2* 24.1* 23.6* 22.5*  PLT 265 318 313 364 368  MCV 91.4 90.4 89.9 86.8 86.2  MCH 27.5 28.6 28.0 26.8 27.2  MCHC 30.1 31.6 31.1 30.9 31.6  RDW 25.0* 25.1* 23.3* 20.2* 18.4*  LYMPHSABS -- -- -- -- --  MONOABS -- -- -- -- --  EOSABS -- -- -- -- --  BASOSABS -- -- -- -- --  BANDABS -- -- -- -- --    Chemistries   Lab 10/31/11 0535 10/30/11 1253 10/29/11 0500 10/27/11 1127 10/25/11 0810  NA 132* 135 135 129* 138  K 3.4* 4.0 2.8* 3.5 3.5  CL 95* 91* 95* 89* 99  CO2 28 30 31 27 27     GLUCOSE 232* 144* 109* 303* 102*  BUN 12 30* 18 31* 25*  CREATININE 2.53* 4.18* 3.12* 3.63* 2.45*  CALCIUM 8.8 10.1 9.8 9.1 9.2  MG -- -- -- -- --   MEDICATIONS: Scheduled Meds:    . ipratropium  0.5 mg Nebulization TID   And  . albuterol  2.5 mg Nebulization TID  . aspirin  81 mg Oral Daily  . calcium acetate  667 mg Oral TID WC  . ceFAZolin (ANCEF) IV  1 g Intravenous On Call to OR  . clopidogrel  75 mg Oral Q breakfast  . darbepoetin (ARANESP) injection - DIALYSIS  200 mcg Intravenous Q Thu-HD  . docusate sodium  100 mg Oral BID  . enoxaparin (LOVENOX) injection  55 mg Subcutaneous Daily  . famotidine  20 mg Oral Daily  . ferric glucontate (NULECIT) IV  125 mg Intravenous Q T,Th,Sa-HD  . guaiFENesin  600 mg Oral BID  . insulin aspart  0-20 Units Subcutaneous TID WC  . insulin aspart  0-5 Units Subcutaneous QHS  . insulin glargine  14 Units Subcutaneous QHS  . isosorbide  mononitrate  120 mg Oral Daily  . levothyroxine  25 mcg Oral Q0600  . multivitamin  1 tablet Oral QHS  . paricalcitol  2 mcg Intravenous Q T,Th,Sa-HD  . vancomycin  125 mg Oral QID  . DISCONTD: ferric glucontate (NULECIT) IV  125 mg Intravenous Q T,Th,Sa-HD   Continuous Infusions:  PRN Meds:.sodium chloride, sodium chloride, sodium chloride, acetaminophen, acetaminophen, ALPRAZolam, alteplase, calcium carbonate (dosed in mg elemental calcium), docusate sodium, feeding supplement (NEPRO CARB STEADY), heparin, heparin, heparin, heparin, heparin, lidocaine, lidocaine-prilocaine, morphine injection, nitroGLYCERIN, ondansetron (ZOFRAN) IV, ondansetron (ZOFRAN) IV, ondansetron, ondansetron, oxyCODONE-acetaminophen pentafluoroprop-tetrafluoroeth, pentafluoroprop-tetrafluoroeth, promethazine, sodium chloride, zolpidem, DISCONTD: sodium chloride, DISCONTD: sodium chloride, DISCONTD: 0.9 % irrigation (POUR BTL), DISCONTD: feeding supplement (NEPRO CARB STEADY), DISCONTD: heparin 6000 unit irrigation, DISCONTD: heparin,  DISCONTD: HYDROmorphone, DISCONTD: lidocaine, DISCONTD: lidocaine-prilocaine, DISCONTD: ondansetron (ZOFRAN) IV, DISCONTD: oxyCODONE  Assessment/Plan: Patient Active Hospital Problem List:  ARF (acute respiratory failure)  Assessment: Better. Tolerating oxygen by nasal cannula now. Etiology is multifactorial with pulmonary edema, presumed pulmonary embolism and possible pneumonia.    Plan: Would continue with oxygen as tolerated. Continues to be stable.  CKD (chronic kidney disease) stage 5,    Assessment: Now with end-stage renal disease.    Plan: Hemodialysis per renal. Dialysis access placed this morning by Dr. Hart Rochester.  DM (diabetes mellitus)    Assessment: CBGs continue to be stable.    Plan: Continue with Lantus and sliding scale insulin.    DVT with presumed pulmonary embolism   Assessment: Stable    Plan: Continue with Lovenox, permanent dialysis access is been considered before this discharge. we will start Coumadin and shoot for therapeutic INR.  HTN (hypertension)    Assessment: Controlled, has some tachycardia some time.    Plan: Continue with Imdur. If tachycardia continues to be well add beta blockers or calcium channel blocker.  Anemia    Assessment: Likely secondary to critical illness and end-stage renal disease.    Plan: Continue to monitor for now, on darbepoetin and intravenous iron. We'll need to transfuse if hemoglobin drops less than 7, however continues to be stable in high 7s.   Diarrhea-from C. difficile colitis   Assessment: Unchanged   Plan: Continue with oral  Vancomycin for a total of 14 days, started on 10/22/2011   ESBL ESCHERICHIA coli UTI  Assessment: Afebrile, completed antibiotic course  Plan: Monitor off antibiotics.  Hypothyroidism  Assessment: Stable, TSH within normal limits.  Plan: Continue with levothyroxine.   Cardiac enzymes elevated    Assessment: Had elevated cardiac enzymes this admission. Seen by cardiology-no further workup  recommended given good LV function.    Plan: Continue with antiplatelet agents.    COPD   Assessment: Stable, lungs clear on exam   Plan: Continue with albuterol and Atrovent nebs.  Deconditioning   Assessment: Secondary to prolonged hospitalization and critical illness.   Plan: Continue with PT/OT will plan to discharge to SNF.  Disposition: Remain inpatient. SNF in [place] is ready to take her Saturday after dialysis if that okay with vascular surgery and nephrology services   DVT Prophylaxis:  on therapeutic Lovenox, coumadin is restarted.  Code Status: Limited code.  Caliph Borowiak A 10/31/2011, 2:27 PM

## 2011-10-31 NOTE — Progress Notes (Addendum)
Subjective: Awake, alert, just back from OR (AVF placed this AM)  Objective: Vital signs in last 24 hours: Blood pressure 96/48, pulse 106, temperature 98.6 F (37 C), temperature source Oral, resp. rate 24, height 5\' 5"  (1.651 m), weight 54 kg (119 lb 0.8 oz), SpO2 90.00%.   Intake/Output from previous day: 12/27 0701 - 12/28 0700 In: 600 [P.O.:600] Out: 913  Intake/Output this shift: Total I/O In: 350 [I.V.:350] Out: 25 [Blood:25] Wt Readings from Last 3 Encounters:  10/30/11 54 kg (119 lb 0.8 oz)  10/30/11 54 kg (119 lb 0.8 oz)  10/30/11 54 kg (119 lb 0.8 oz)     PHYSICAL EXAM General--awake, alert Chest--clear Heart--no rub Abd--nontender Extr--tr edema.   AVF (basilic vein transposition) R upper arm patent  Lab Results:   Lab 10/31/11 0535 10/30/11 1253 10/29/11 0500 10/27/11 1127  NA 132* 135 135 --  K 3.4* 4.0 2.8* --  CL 95* 91* 95* --  CO2 28 30 31  --  BUN 12 30* 18 --  CREATININE 2.53* 4.18* 3.12* --  EGFR -- -- -- --  GLUCOSE 232* -- -- --  CALCIUM 8.8 10.1 9.8 --  PHOS -- 2.7 2.9 2.3      Basename 10/31/11 0535 10/30/11 1253  WBC 11.8* 11.4*  HGB 7.7* 8.6*  HCT 25.6* 27.2*  PLT 265 318    Scheduled: Continuous:   Assessment/Plan: 1.  ESRD Now with tunneled cath and R Upper arm AVF.  Dialyze tomorrow.  2.   DM good control  3.   DVT enox  4.   Copd  5.   Anemia   Fe/TIBC 20 % sat, ferritin 172 on 9 Dec. Hgb 7.7       today.  Receiving IV Fe and aranesp (200/ wk) 6.  Secondary PTH  (PTH 368 on 18 Dec).  Ca 8.8, phos 2.7.       PhosLo 1 TID.  Not yet on zemplar--will start 2 mcg tiw 7.  Hypothyroidism-- TSH 1.66 earlier this month 8.  C diff--on po vanco  May go to nursing home on Sunday, then HD in [place] on Monday   LOS: 20 days   Shawnell Dykes F 10/31/2011,11:13 AM

## 2011-10-31 NOTE — Progress Notes (Addendum)
ANTICOAGULATION CONSULT NOTE - Follow Up Consult  Pharmacy Consult for Lovenox/Coumadin Indication: DVT and presumed PE  Allergies  Allergen Reactions  . Novocain Other (See Comments)    Unknown    Patient Measurements: Height: 5\' 5"  (165.1 cm) Weight: 119 lb 0.8 oz (54 kg) IBW/kg (Calculated) : 57  Adjusted Body Weight:   Vital Signs: Temp: 98.8 F (37.1 C) (12/28 1329) Temp src: Oral (12/28 1329) BP: 95/48 mmHg (12/28 1329) Pulse Rate: 105  (12/28 1329)  Labs:  Basename 10/31/11 0535 10/30/11 1253 10/29/11 0500  HGB 7.7* 8.6* --  HCT 25.6* 27.2* 24.1*  PLT 265 318 313  APTT -- -- --  LABPROT -- -- --  INR -- -- --  HEPARINUNFRC -- -- --  CREATININE 2.53* 4.18* 3.12*  CKTOTAL -- -- --  CKMB -- -- --  TROPONINI -- -- --   Estimated Creatinine Clearance: 18.1 ml/min (by C-G formula based on Cr of 2.53).   Medications:  Scheduled:     . ipratropium  0.5 mg Nebulization TID   And  . albuterol  2.5 mg Nebulization TID  . aspirin  81 mg Oral Daily  . calcium acetate  667 mg Oral TID WC  . ceFAZolin (ANCEF) IV  1 g Intravenous On Call to OR  . clopidogrel  75 mg Oral Q breakfast  . darbepoetin (ARANESP) injection - DIALYSIS  200 mcg Intravenous Q Thu-HD  . docusate sodium  100 mg Oral BID  . enoxaparin (LOVENOX) injection  55 mg Subcutaneous Daily  . famotidine  20 mg Oral Daily  . ferric glucontate (NULECIT) IV  125 mg Intravenous Q T,Th,Sa-HD  . guaiFENesin  600 mg Oral BID  . insulin aspart  0-20 Units Subcutaneous TID WC  . insulin aspart  0-5 Units Subcutaneous QHS  . insulin glargine  14 Units Subcutaneous QHS  . isosorbide mononitrate  120 mg Oral Daily  . levothyroxine  25 mcg Oral Q0600  . multivitamin  1 tablet Oral QHS  . paricalcitol  2 mcg Intravenous Q T,Th,Sa-HD  . vancomycin  125 mg Oral QID  . DISCONTD: ferric glucontate (NULECIT) IV  125 mg Intravenous Q T,Th,Sa-HD   Assessment:   Anticoagulation:   RLE DVT and presumed PE.  Pt  currently on lovenox 1mg /kg q 24h d/t CrCl and HD initiation.  Warfarin waited to start on pt b/c of AVF placement surgery, which was completed this AM.  Pharmacy already familiar with pt d/t lovenox dosing and previous Coumadin dosing this admission.  Pt previously received 4 doses Coumadin 2.5mg  which elevated INR very quickly to supratherapeutic levels and pt had nosebleeds.  Pt likely to be d/c'd this weekend to SNF.   12/25 INR 1.36; 12/28 Hgb 8.6--> 7.7   Infectious Disease: Day#8/14 POVanc 125 mg PO qid for C diff infection., afebrile, WBC 11.8.  Stop date in place for vanc.   S/p completed course Primaxin for E.coli UTI. Cardiovascular: HTN/CAD: BP 95/48, HR 105 - on home ASA, Plavix, Imdur Endocrinology:  CBGs 130-296, on Lantus, SSI, bedtime coverage, Levothyroxine per home dose.  Gastrointestinal / Nutrition: colitis, Pepcid PO.  Nephrology: ESRD on HD TTS - on Phoslo, Renavite,  MD to transfuse if hgb < 7.  Na 132, K 3.4 Pulmonary: COPD stable. 97% on 3L.  Meds:  guaifenesin, atrovent/albuterol Hematology / Oncology: darbe 200 (q thurs), nulecit 125 q HD, zemplar,   Hgb 8.6-->7.7 Best Practices: Lovenox, Pepcid, home meds addressed.  Goal of Therapy:  INR  2-3   Plan:  Initiate Coumadin 2.5mg  po x1 tonight.  Subsequent doses will most likely need to be lower hereafter due to pts high sensitivity to coumadin - possibly 1mg /day.  F/u daily INRs, CBC.   Continue Lovenox 55mg  q 24h.  Bridge with Coumadin at least 5 days and INR >2 for 48 hours.

## 2011-10-31 NOTE — Interval H&P Note (Signed)
History and Physical Interval Note:  10/31/2011 7:36 AM  [name] [name]  has presented today for surgery, with the diagnosis of end stage renal disease  The various methods of treatment have been discussed with the patient and family. After consideration of risks, benefits and other options for treatment, the patient has consented to  Procedure(s): BASCILIC VEIN TRANSPOSITION as a surgical intervention .  The patients' history has been reviewed, patient examined, no change in status, stable for surgery.  I have reviewed the patients' chart and labs.  Questions were answered to the patient's satisfaction.     [name] D

## 2011-10-31 NOTE — Progress Notes (Signed)
Patient has selected SNF bed at [facility] and can be accepted on tomorrow(Saturday) per SNF if medically ready. Will ask weekend staff to follow up for d/c- Reece Levy, MSW, [employer] [phone]

## 2011-10-31 NOTE — Anesthesia Postprocedure Evaluation (Signed)
  Anesthesia Post-op Note  Patient: [name] [name]  Procedure(s) Performed:  BASCILIC VEIN TRANSPOSITION  Patient Location: PACU  Anesthesia Type: General  Level of Consciousness: awake, alert , oriented and patient cooperative  Airway and Oxygen Therapy: Patient Spontanous Breathing and Patient connected to nasal cannula oxygen  Post-op Pain: none  Post-op Assessment: Post-op Vital signs reviewed, Patient's Cardiovascular Status Stable, Respiratory Function Stable, Patent Airway, No signs of Nausea or vomiting and Pain level controlled  Post-op Vital Signs: stable  Complications: No apparent anesthesia complications

## 2011-11-01 ENCOUNTER — Inpatient Hospital Stay (HOSPITAL_COMMUNITY): Payer: Medicare Other

## 2011-11-01 DIAGNOSIS — N390 Urinary tract infection, site not specified: Secondary | ICD-10-CM | POA: Diagnosis present

## 2011-11-01 LAB — RENAL FUNCTION PANEL
BUN: 22 mg/dL (ref 6–23)
CO2: 26 mEq/L (ref 19–32)
Calcium: 8.5 mg/dL (ref 8.4–10.5)
Glucose, Bld: 143 mg/dL — ABNORMAL HIGH (ref 70–99)
Phosphorus: 2.6 mg/dL (ref 2.3–4.6)

## 2011-11-01 LAB — CBC
HCT: 24.8 % — ABNORMAL LOW (ref 36.0–46.0)
Hemoglobin: 7.5 g/dL — ABNORMAL LOW (ref 12.0–15.0)
MCH: 28 pg (ref 26.0–34.0)
MCHC: 30.2 g/dL (ref 30.0–36.0)

## 2011-11-01 LAB — GLUCOSE, CAPILLARY: Glucose-Capillary: 156 mg/dL — ABNORMAL HIGH (ref 70–99)

## 2011-11-01 MED ORDER — ACETAMINOPHEN 325 MG PO TABS
ORAL_TABLET | ORAL | Status: AC
  Filled 2011-11-01: qty 2

## 2011-11-01 MED ORDER — INSULIN GLARGINE 100 UNIT/ML ~~LOC~~ SOLN: 18.0000 [IU] | Freq: Every day | SUBCUTANEOUS | Status: DC

## 2011-11-01 MED ORDER — VANCOMYCIN 50 MG/ML ORAL SOLUTION: 125.0000 mg | Freq: Four times a day (QID) | ORAL | Status: DC

## 2011-11-01 MED ORDER — ENOXAPARIN SODIUM 60 MG/0.6ML ~~LOC~~ SOLN: 55.0000 mg | Freq: Every day | SUBCUTANEOUS | Status: DC

## 2011-11-01 MED ORDER — PARICALCITOL 5 MCG/ML IV SOLN
INTRAVENOUS | Status: AC
  Administered 2011-11-01: 2 ug via INTRAVENOUS
  Filled 2011-11-01: qty 1

## 2011-11-01 MED ORDER — ASPIRIN 81 MG PO CHEW: 81.0000 mg | CHEWABLE_TABLET | Freq: Every day | ORAL | Status: DC

## 2011-11-01 MED ORDER — CALCIUM ACETATE 667 MG PO CAPS: 667.0000 mg | ORAL_CAPSULE | Freq: Three times a day (TID) | ORAL | Status: DC

## 2011-11-01 MED ORDER — OXYCODONE-ACETAMINOPHEN 5-325 MG PO TABS: 1.0000 | ORAL_TABLET | Freq: Four times a day (QID) | ORAL | Status: AC | PRN

## 2011-11-01 NOTE — Procedures (Signed)
On HD via R IJ tunneled cath.  BP 152/74--BP now 100/50.  There are discrepancies in weight (54 kg yesterday and 64 kg today!).  I think she's closer to lower weight.  Will weigh post HD and decrease UF goal Goal.  Renal profile and CBC sent from dialysis today(pending).  If lab looks ok, then OK for d/c with f/u at [facility] dialysis on Monday. Wt Readings from Last 3 Encounters:  11/01/11 64.2 kg (141 lb 8.6 oz)  11/01/11 64.2 kg (141 lb 8.6 oz)  11/01/11 64.2 kg (141 lb 8.6 oz)

## 2011-11-01 NOTE — Progress Notes (Signed)
CSW informed Pt ready for d/c.  Confirmed with facility Pt can return, family aware.  Pt transporting via [facility]. Milus Banister MSW,LCSW w/e Coverage [phone]

## 2011-11-01 NOTE — Progress Notes (Signed)
No real complaints some right arm soreness, no numbness or tingling in hand  Blood pressure 105/50, pulse 109, temperature 98.8 F (37.1 C), temperature source Oral, resp. rate 20, height 5\' 5"  (1.651 m), weight 141 lb 8.6 oz (64.2 kg), SpO2 92.00%.  Right upper extremity + thrill some ecchymosis right hand warm  Assessment : healing right basilic vein transposition  Plan: F/U 4-6 weeks with Dr. Hart Rochester to recheck fistula  Fabienne Bruns, MD [facility] Office: [phone] Pager: [phone]

## 2011-11-01 NOTE — Discharge Summary (Signed)
HOSPITAL DISCHARGE SUMMARY  [name] [name]  MRN: [id number]  DOB:January 23, 1941  Date of Admission: 10/11/2011 Date of Discharge: 11/01/2011         LOS: 21 days   Attending Physician:[name] [name]  Patient's AOZ:HYQMVH,QIONGEX C, MD  Consults: Treatment Team:  [name] Krabbe, MD [name] Maes, MD   Discharge Diagnoses: Present on Admission:  .ARF (acute respiratory failure) .Pleural effusion, bilateral .Hyperkalemia .AKI (acute kidney injury) .CKD (chronic kidney disease) stage 5, GFR less than 15 ml/min .DM (diabetes mellitus) .COPD with acute exacerbation .HTN (hypertension) .Anemia   Current Discharge Medication List    START taking these medications   Details  aspirin 81 MG chewable tablet Chew 1 tablet (81 mg total) by mouth daily.    calcium acetate (PHOSLO) 667 MG capsule Take 1 capsule (667 mg total) by mouth 3 (three) times daily with meals.    enoxaparin (LOVENOX) 60 MG/0.6ML SOLN Inject 0.55 mLs (55 mg total) into the skin daily.    oxyCODONE-acetaminophen (PERCOCET) 5-325 MG per tablet Take 1 tablet by mouth every 6 (six) hours as needed for pain. Qty: 30 tablet, Refills: 0    vancomycin (VANCOCIN) 50 mg/mL oral solution Take 2.5 mLs (125 mg total) by mouth 4 (four) times daily. Qty: 50 mL, Refills: 0      CONTINUE these medications which have CHANGED   Details  insulin glargine (LANTUS) 100 UNIT/ML injection Inject 18 Units into the skin at bedtime. Qty: 10 mL      CONTINUE these medications which have NOT CHANGED   Details  amLODipine (NORVASC) 10 MG tablet Take 10 mg by mouth daily.      cloNIDine (CATAPRES) 0.2 MG tablet Take 0.2 mg by mouth 3 (three) times daily.      hydrALAZINE (APRESOLINE) 50 MG tablet Take 50 mg by mouth 2 (two) times daily.      isosorbide mononitrate (IMDUR) 120 MG 24 hr tablet Take 120 mg by mouth daily.      levothyroxine (SYNTHROID, LEVOTHROID) 25 MCG tablet Take 25 mcg by mouth daily.      metoprolol  (LOPRESSOR) 50 MG tablet Take 50 mg by mouth 2 (two) times daily.      nitroGLYCERIN (NITROSTAT) 0.4 MG SL tablet Place 0.4 mg under the tongue every 5 (five) minutes as needed. For chest pain.     pantoprazole (PROTONIX) 40 MG tablet Take 40 mg by mouth daily.      ranitidine (ZANTAC) 150 MG tablet Take 150 mg by mouth 2 (two) times daily.        STOP taking these medications     furosemide (LASIX) 40 MG tablet      insulin regular (HUMULIN R,NOVOLIN R) 100 units/mL injection      potassium chloride SA (K-DUR,KLOR-CON) 20 MEQ tablet      Atorvastatin Calcium (LIPITOR PO)      Clopidogrel Bisulfate (PLAVIX PO)      Insulin Isophane Human (HUMULIN N [place])      PREDNISONE PO      Ranolazine (RANEXA PO)      SODIUM BICARBONATE PO          Brief Admission History: [name] [name] was transferred from [place] ED today with worsening SOB. She has felt increasingly weak in the last 2 days. She was hospitalized at [facility] hospital about 2 weeks ago, for presumably PNA. She says she had fluid in one side of the lung. She apparently completed course of abx then. However, she has continued to feel  unwell and weak, with some abdominal pain, SOBE. At [facility] ED patient was found to have bilateral pleural effusions with atelectasis, in setting of acute on chronic renal failure, BUN/Cr 77/4.97 today with [name] potassium of 6.6. Patient says she has an atrophic kidney, and has seen Dr [name] [name] in the past but apparently got lost to follow up. She says she has had some belly pains and had some diarrhea today. She was on cefuroxime till about 4 days ago for "PNA". She uses oxygen and nebuliser treatments at home but she has been "whistling" in the chest lately. At [facility] her ProBNP was more than 20000. She denies not recall recent 2 decho. She admits to some subjective fever. No dysuria. She was given kayexalate/insulin/d50w and referred to [facility] as she may require dialysis. I have informed Dr [name] [name] about the  patient and he will graciously see in am.  Hospital Course: Patient has prolonged and rather complicated hospital stay admitted on 10/11/2011. Present on Admission:  .ARF (acute respiratory failure) .Pleural effusion, bilateral .Hyperkalemia .AKI (acute kidney injury) .CKD (chronic kidney disease) stage 5, GFR less than 15 ml/min .DM (diabetes mellitus) .COPD with acute exacerbation .HTN (hypertension) .Anemia .UTI (lower urinary tract infection)  1. Acute respiratory failure: This is multifactorial likely secondary to pulmonary edema/fluid overload, presumed PE and COPD exacerbation.. Patient required noninvasive mechanical ventilation in the ICU. She was weaned off to [name] nasal cannula and transferred back to the floor. Currently she requires 2 L of oxygen.  2. ESRD: Patient had chronic kidney disease stage V for sometime. She was using Lasix at home she lost followup with Dr. Hyman [name]. After admission patient was found to have pulmonary edema with oh BNP of more than 20,000. 2-D echocardiogram showed LVEF of 55% in grade 2 diastolic dysfunction. The pulmonary edema patient had probably is noncardiogenic secondary to fluid overload from renal insufficiency. Nephrology service was consulted. Aggressive diuresis was not successful. The patient was started on hemodialysis. Nephrology recommended to continue hemodialysis as patient already has stage V chronic kidney disease. Patient set up for dialysis, [place], [employer] on Monday/Wednesday/Friday. Patient is getting dialysis through right IJ tunneled catheter, she had right upper extremity AV fistula done by Dr. Hart [name] on 10/31/2011.  3. COPD exacerbation: Patient was treated for pneumonia before she came in to the hospital. That was believed contributed to her acute respiratory failure with the fluid overload. Patient was treated for about 10 days with IV antibiotics.  4. UTI: Patient has ESBL producing Escherichia coli UTI, treated with IV  antibiotics for about 10 days.  5. Acute RLE DVT: This is treated with Lovenox and Coumadin. Patient also presumed to have PE. Patient will be discharged to Lovenox bridge and Coumadin. Target INR is 2-3 for at least 6-9 months.  6. Anemia: This is anemia of chronic kidney disease, exacerbated by critical illness. Patient to followup her dialysis center transfuse if symptomatic or if hemoglobin less than 7.  7. C. difficile colitis: C. difficile positive by PCR. Patient is on oral vancomycin started on the 20th. We will treat with total 14 days to be done on January 3. Currently patient does not have loose stools or diarrhea.  8. Deconditioning: Because of prolonged hospital stay, critical illness and generalized weakness after patient evaluated by PT/OT SNF is recommended. Patient to go to skilled nursing facility today for short-term rehabilitation.  Day of Discharge BP 127/56  Pulse 105  Temp(Src) 98.8 F (37.1 C) (Oral)  Resp 16  Ht  5\' 5"  (1.651 m)  Wt 63.4 kg (139 lb 12.4 oz)  BMI 23.26 kg/m2  SpO2 92% Physical Exam: GEN: No acute distress, cooperative with exam PSYCH: He is alert and oriented x4; does not appear anxious does not appear depressed; affect is normal  HEENT: Mucous membranes pink and anicteric;  Mouth: without oral thrush or lesions Eyes: PERRLA; EOM intact;  Neck: no cervical lymphadenopathy nor thyromegaly or carotid bruit; no JVD;  CHEST WALL: No tenderness, symmetrical to breathing bilaterally CHEST: Normal respiration, clear to auscultation bilaterally  HEART: Regular rate and rhythm; no murmurs, rubs or gallops, S1 and S2 heard  BACK: No kyphosis or scoliosis; no CVA tenderness  ABDOMEN:  soft non-tender; no masses, no organomegaly, normal abdominal bowel sounds; no pannus; no intertriginous candida.  EXTREMITIES: No bone or joint deformity; no edema; no ulcerations.  PULSES: 2+ and symmetric, neurovascularity is intact SKIN: Normal hydration no rash or  ulceration, no flushing or suspicious lesions  CNS: Cranial nerves 2-12 grossly intact no focal neurologic deficit, coordination is intact gait not tested    Results for orders placed during the hospital encounter of 10/11/11 (from the past 24 hour(s))  GLUCOSE, CAPILLARY     Status: Abnormal   Collection Time   10/31/11 12:43 PM      Component Value Range   Glucose-Capillary 149 (*) 70 - 99 (mg/dL)  GLUCOSE, CAPILLARY     Status: Abnormal   Collection Time   10/31/11  4:46 PM      Component Value Range   Glucose-Capillary 159 (*) 70 - 99 (mg/dL)  GLUCOSE, CAPILLARY     Status: Abnormal   Collection Time   10/31/11  9:12 PM      Component Value Range   Glucose-Capillary 213 (*) 70 - 99 (mg/dL)  CBC     Status: Abnormal   Collection Time   11/01/11  7:55 AM      Component Value Range   WBC 11.4 (*) 4.0 - 10.5 (K/uL)   RBC 2.68 (*) 3.87 - 5.11 (MIL/uL)   Hemoglobin 7.5 (*) 12.0 - 15.0 (g/dL)   HCT 16.1 (*) 09.6 - 46.0 (%)   MCV 92.5  78.0 - 100.0 (fL)   MCH 28.0  26.0 - 34.0 (pg)   MCHC 30.2  30.0 - 36.0 (g/dL)   RDW 04.5 (*) 40.9 - 15.5 (%)   Platelets 256  150 - 400 (K/uL)  RENAL FUNCTION PANEL     Status: Abnormal   Collection Time   11/01/11  7:55 AM      Component Value Range   Sodium 132 (*) 135 - 145 (mEq/L)   Potassium 3.6  3.5 - 5.1 (mEq/L)   Chloride 95 (*) 96 - 112 (mEq/L)   CO2 26  19 - 32 (mEq/L)   Glucose, Bld 143 (*) 70 - 99 (mg/dL)   BUN 22  6 - 23 (mg/dL)   Creatinine, Ser 8.11 (*) 0.50 - 1.10 (mg/dL)   Calcium 8.5  8.4 - 91.4 (mg/dL)   Phosphorus 2.6  2.3 - 4.6 (mg/dL)   Albumin 2.5 (*) 3.5 - 5.2 (g/dL)   GFR calc non Af Amer 11 (*) >90 (mL/min)   GFR calc Af Amer 13 (*) >90 (mL/min)    Disposition: SNF   Follow-up Appts: Discharge Orders    Future Orders Please Complete By Expires   Diet - low sodium heart healthy      Diet Carb Modified      Increase activity slowly  I spent 40 minutes completing paperwork and coordinating  discharge efforts.  SignedClydia Llano [name] 11/01/2011, 11:00 AM

## 2011-12-04 ENCOUNTER — Ambulatory Visit: Payer: Medicare Other

## 2011-12-15 ENCOUNTER — Encounter: Payer: Self-pay | Admitting: Vascular Surgery

## 2011-12-16 ENCOUNTER — Ambulatory Visit: Payer: Medicare Other | Admitting: Vascular Surgery

## 2012-01-02 DIAGNOSIS — I2510 Atherosclerotic heart disease of native coronary artery without angina pectoris: Secondary | ICD-10-CM

## 2012-01-02 HISTORY — DX: Atherosclerotic heart disease of native coronary artery without angina pectoris: I25.10

## 2012-01-23 ENCOUNTER — Inpatient Hospital Stay (HOSPITAL_COMMUNITY): Payer: Medicare Other

## 2012-01-23 ENCOUNTER — Encounter (HOSPITAL_COMMUNITY): Payer: Self-pay | Admitting: *Deleted

## 2012-01-23 ENCOUNTER — Inpatient Hospital Stay (HOSPITAL_COMMUNITY)
Admission: AD | Admit: 2012-01-23 | Discharge: 2012-01-29 | DRG: 193 | Disposition: A | Discharge status: Home Health | Payer: Medicare Other | Source: Other Acute Inpatient Hospital | Attending: Internal Medicine | Admitting: Internal Medicine

## 2012-01-23 DIAGNOSIS — J189 Pneumonia, unspecified organism: Secondary | ICD-10-CM

## 2012-01-23 DIAGNOSIS — J9600 Acute respiratory failure, unspecified whether with hypoxia or hypercapnia: Secondary | ICD-10-CM

## 2012-01-23 DIAGNOSIS — J9620 Acute and chronic respiratory failure, unspecified whether with hypoxia or hypercapnia: Secondary | ICD-10-CM

## 2012-01-23 DIAGNOSIS — E7800 Pure hypercholesterolemia, unspecified: Secondary | ICD-10-CM

## 2012-01-23 DIAGNOSIS — J810 Acute pulmonary edema: Secondary | ICD-10-CM

## 2012-01-23 DIAGNOSIS — Z86718 Personal history of other venous thrombosis and embolism: Secondary | ICD-10-CM

## 2012-01-23 DIAGNOSIS — Z992 Dependence on renal dialysis: Secondary | ICD-10-CM

## 2012-01-23 DIAGNOSIS — I739 Peripheral vascular disease, unspecified: Secondary | ICD-10-CM

## 2012-01-23 DIAGNOSIS — Z86711 Personal history of pulmonary embolism: Secondary | ICD-10-CM

## 2012-01-23 DIAGNOSIS — D638 Anemia in other chronic diseases classified elsewhere: Secondary | ICD-10-CM | POA: Diagnosis present

## 2012-01-23 DIAGNOSIS — A498 Other bacterial infections of unspecified site: Secondary | ICD-10-CM | POA: Diagnosis present

## 2012-01-23 DIAGNOSIS — R748 Abnormal levels of other serum enzymes: Secondary | ICD-10-CM

## 2012-01-23 DIAGNOSIS — J441 Chronic obstructive pulmonary disease with (acute) exacerbation: Secondary | ICD-10-CM

## 2012-01-23 DIAGNOSIS — K219 Gastro-esophageal reflux disease without esophagitis: Secondary | ICD-10-CM | POA: Diagnosis present

## 2012-01-23 DIAGNOSIS — J90 Pleural effusion, not elsewhere classified: Secondary | ICD-10-CM

## 2012-01-23 DIAGNOSIS — I252 Old myocardial infarction: Secondary | ICD-10-CM

## 2012-01-23 DIAGNOSIS — R310 Gross hematuria: Secondary | ICD-10-CM | POA: Diagnosis present

## 2012-01-23 DIAGNOSIS — N2581 Secondary hyperparathyroidism of renal origin: Secondary | ICD-10-CM | POA: Diagnosis present

## 2012-01-23 DIAGNOSIS — Z9981 Dependence on supplemental oxygen: Secondary | ICD-10-CM

## 2012-01-23 DIAGNOSIS — E039 Hypothyroidism, unspecified: Secondary | ICD-10-CM

## 2012-01-23 DIAGNOSIS — N185 Chronic kidney disease, stage 5: Secondary | ICD-10-CM

## 2012-01-23 DIAGNOSIS — N186 End stage renal disease: Secondary | ICD-10-CM

## 2012-01-23 DIAGNOSIS — N179 Acute kidney failure, unspecified: Secondary | ICD-10-CM

## 2012-01-23 DIAGNOSIS — N390 Urinary tract infection, site not specified: Secondary | ICD-10-CM

## 2012-01-23 DIAGNOSIS — J4489 Other specified chronic obstructive pulmonary disease: Secondary | ICD-10-CM | POA: Diagnosis present

## 2012-01-23 DIAGNOSIS — E119 Type 2 diabetes mellitus without complications: Secondary | ICD-10-CM

## 2012-01-23 DIAGNOSIS — J449 Chronic obstructive pulmonary disease, unspecified: Secondary | ICD-10-CM | POA: Diagnosis present

## 2012-01-23 DIAGNOSIS — D649 Anemia, unspecified: Secondary | ICD-10-CM

## 2012-01-23 DIAGNOSIS — I2510 Atherosclerotic heart disease of native coronary artery without angina pectoris: Secondary | ICD-10-CM | POA: Diagnosis present

## 2012-01-23 DIAGNOSIS — I10 Essential (primary) hypertension: Secondary | ICD-10-CM | POA: Diagnosis present

## 2012-01-23 DIAGNOSIS — B351 Tinea unguium: Secondary | ICD-10-CM

## 2012-01-23 DIAGNOSIS — E875 Hyperkalemia: Secondary | ICD-10-CM

## 2012-01-23 DIAGNOSIS — I120 Hypertensive chronic kidney disease with stage 5 chronic kidney disease or end stage renal disease: Secondary | ICD-10-CM | POA: Diagnosis present

## 2012-01-23 LAB — URINALYSIS, ROUTINE W REFLEX MICROSCOPIC
Bilirubin Urine: NEGATIVE
Ketones, ur: NEGATIVE mg/dL
Nitrite: POSITIVE — AB
Urobilinogen, UA: 2 mg/dL — ABNORMAL HIGH (ref 0.0–1.0)

## 2012-01-23 LAB — CBC
Hemoglobin: 10.9 g/dL — ABNORMAL LOW (ref 12.0–15.0)
MCH: 26.8 pg (ref 26.0–34.0)
MCHC: 31.1 g/dL (ref 30.0–36.0)
Platelets: 351 10*3/uL (ref 150–400)
RBC: 4.06 MIL/uL (ref 3.87–5.11)

## 2012-01-23 LAB — COMPREHENSIVE METABOLIC PANEL
ALT: 7 U/L (ref 0–35)
AST: 11 U/L (ref 0–37)
Alkaline Phosphatase: 101 U/L (ref 39–117)
Calcium: 9.8 mg/dL (ref 8.4–10.5)
GFR calc Af Amer: 14 mL/min — ABNORMAL LOW (ref >90)
Glucose, Bld: 262 mg/dL — ABNORMAL HIGH (ref 70–99)
Potassium: 4.8 mEq/L (ref 3.5–5.1)
Sodium: 131 mEq/L — ABNORMAL LOW (ref 135–145)
Total Protein: 7.3 g/dL (ref 6.0–8.3)

## 2012-01-23 LAB — TYPE AND SCREEN
ABO/RH(D): O POS
Antibody Screen: NEGATIVE

## 2012-01-23 LAB — CARDIAC PANEL(CRET KIN+CKTOT+MB+TROPI)
CK, MB: 1 ng/mL (ref 0.3–4.0)
CK, MB: 0.8 ng/mL (ref 0.3–4.0)
Relative Index: INVALID (ref 0.0–2.5)
Total CK: 21 U/L (ref 7–177)
Total CK: 18 U/L (ref 7–177)
Troponin I: <0.3 ng/mL (ref <0.30)

## 2012-01-23 LAB — GLUCOSE, CAPILLARY
Glucose-Capillary: 232 mg/dL — ABNORMAL HIGH (ref 70–99)
Glucose-Capillary: 167 mg/dL — ABNORMAL HIGH (ref 70–99)

## 2012-01-23 LAB — URINE MICROSCOPIC-ADD ON

## 2012-01-23 LAB — PROTIME-INR: INR: 2.45 — ABNORMAL HIGH (ref 0.00–1.49)

## 2012-01-23 LAB — MRSA PCR SCREENING: MRSA by PCR: NEGATIVE

## 2012-01-23 MED ORDER — CHLORHEXIDINE GLUCONATE 0.12 % MT SOLN
15.0000 mL | Freq: Two times a day (BID) | OROMUCOSAL | Status: DC
  Administered 2012-01-23 – 2012-01-24 (×3): 15 mL via OROMUCOSAL
  Filled 2012-01-23 (×2): qty 15

## 2012-01-23 MED ORDER — SODIUM CHLORIDE 0.9 % IJ SOLN: 3.0000 mL | INTRAMUSCULAR | Status: DC | PRN

## 2012-01-23 MED ORDER — VANCOMYCIN HCL 1000 MG IV SOLR
1250.0000 mg | Freq: Once | INTRAVENOUS | Status: AC
  Administered 2012-01-23: 1250 mg via INTRAVENOUS
  Filled 2012-01-23: qty 1250

## 2012-01-23 MED ORDER — HEPARIN SODIUM (PORCINE) 5000 UNIT/ML IJ SOLN
5000.0000 [IU] | Freq: Three times a day (TID) | INTRAMUSCULAR | Status: DC
  Administered 2012-01-23: 5000 [IU] via SUBCUTANEOUS
  Filled 2012-01-23 (×3): qty 1

## 2012-01-23 MED ORDER — DEXTROSE 5 % IV SOLN
1.0000 g | Freq: Once | INTRAVENOUS | Status: AC
  Administered 2012-01-23: 1 g via INTRAVENOUS
  Filled 2012-01-23: qty 1

## 2012-01-23 MED ORDER — LEVOTHYROXINE SODIUM 25 MCG PO TABS
25.0000 ug | ORAL_TABLET | Freq: Every day | ORAL | Status: DC
  Administered 2012-01-23 – 2012-01-29 (×6): 25 ug via ORAL
  Filled 2012-01-23 (×7): qty 1

## 2012-01-23 MED ORDER — SODIUM CHLORIDE 0.9 % IV SOLN: 250.0000 mL | INTRAVENOUS | Status: DC | PRN

## 2012-01-23 MED ORDER — DEXTROSE 5 % IV SOLN
500.0000 mg | INTRAVENOUS | Status: DC
  Administered 2012-01-23: 500 mg via INTRAVENOUS
  Filled 2012-01-23 (×2): qty 500

## 2012-01-23 MED ORDER — ASPIRIN 81 MG PO CHEW
81.0000 mg | CHEWABLE_TABLET | Freq: Every day | ORAL | Status: DC
  Administered 2012-01-23 – 2012-01-29 (×7): 81 mg via ORAL
  Filled 2012-01-23 (×7): qty 1

## 2012-01-23 MED ORDER — FAMOTIDINE 20 MG PO TABS: 20.0000 mg | ORAL_TABLET | Freq: Two times a day (BID) | ORAL | Status: DC

## 2012-01-23 MED ORDER — WARFARIN - PHARMACIST DOSING INPATIENT
Freq: Every day | Status: DC
  Administered 2012-01-28: 18:00:00

## 2012-01-23 MED ORDER — INSULIN ASPART 100 UNIT/ML ~~LOC~~ SOLN
3.0000 [IU] | Freq: Three times a day (TID) | SUBCUTANEOUS | Status: DC
  Administered 2012-01-24 (×3): 3 [IU] via SUBCUTANEOUS
  Administered 2012-01-25: 17:00:00 via SUBCUTANEOUS
  Administered 2012-01-25: 3 [IU] via SUBCUTANEOUS
  Administered 2012-01-25: 100 [IU] via SUBCUTANEOUS
  Administered 2012-01-26 (×2): 3 [IU] via SUBCUTANEOUS

## 2012-01-23 MED ORDER — NITROGLYCERIN 0.4 MG SL SUBL: 0.4000 mg | SUBLINGUAL_TABLET | SUBLINGUAL | Status: DC | PRN

## 2012-01-23 MED ORDER — DEXTROSE 5 % IV SOLN
1.0000 g | INTRAVENOUS | Status: DC
  Administered 2012-01-23 – 2012-01-25 (×3): 1 g via INTRAVENOUS
  Filled 2012-01-23 (×5): qty 1

## 2012-01-23 MED ORDER — CALCIUM ACETATE 667 MG PO CAPS
667.0000 mg | ORAL_CAPSULE | Freq: Three times a day (TID) | ORAL | Status: DC
  Administered 2012-01-24 (×2): 667 mg via ORAL
  Filled 2012-01-23 (×6): qty 1

## 2012-01-23 MED ORDER — INSULIN GLARGINE 100 UNIT/ML ~~LOC~~ SOLN
18.0000 [IU] | Freq: Every day | SUBCUTANEOUS | Status: DC
  Administered 2012-01-24 – 2012-01-28 (×5): 18 [IU] via SUBCUTANEOUS

## 2012-01-23 MED ORDER — WARFARIN SODIUM 2 MG PO TABS
2.0000 mg | ORAL_TABLET | Freq: Once | ORAL | Status: AC
  Administered 2012-01-23: 2 mg via ORAL
  Filled 2012-01-23: qty 1

## 2012-01-23 MED ORDER — INSULIN ASPART 100 UNIT/ML ~~LOC~~ SOLN
0.0000 [IU] | Freq: Three times a day (TID) | SUBCUTANEOUS | Status: DC
  Administered 2012-01-23: 3 [IU] via SUBCUTANEOUS
  Administered 2012-01-23: 5 [IU] via SUBCUTANEOUS
  Administered 2012-01-24 (×2): 3 [IU] via SUBCUTANEOUS
  Administered 2012-01-24: 0.3 [IU] via SUBCUTANEOUS
  Administered 2012-01-25: 3 [IU] via SUBCUTANEOUS
  Administered 2012-01-25: 5 [IU] via SUBCUTANEOUS
  Administered 2012-01-26 (×3): 3 [IU] via SUBCUTANEOUS
  Administered 2012-01-27: 11 [IU] via SUBCUTANEOUS
  Administered 2012-01-27: 2 [IU] via SUBCUTANEOUS
  Administered 2012-01-27: 3 [IU] via SUBCUTANEOUS
  Administered 2012-01-28: 2 [IU] via SUBCUTANEOUS
  Administered 2012-01-28: 5 [IU] via SUBCUTANEOUS
  Administered 2012-01-29: 2 [IU] via SUBCUTANEOUS

## 2012-01-23 MED ORDER — PARICALCITOL 5 MCG/ML IV SOLN
1.0000 ug | INTRAVENOUS | Status: DC
  Administered 2012-01-23: 1 ug via INTRAVENOUS
  Filled 2012-01-23: qty 0.2

## 2012-01-23 MED ORDER — HEPARIN SODIUM (PORCINE) 1000 UNIT/ML DIALYSIS
20.0000 [IU]/kg | INTRAMUSCULAR | Status: DC | PRN
  Filled 2012-01-23: qty 2

## 2012-01-23 MED ORDER — SODIUM CHLORIDE 0.9 % IJ SOLN
3.0000 mL | Freq: Two times a day (BID) | INTRAMUSCULAR | Status: DC
  Administered 2012-01-23 – 2012-01-26 (×5): 3 mL via INTRAVENOUS

## 2012-01-23 MED ORDER — VANCOMYCIN HCL 1000 MG IV SOLR: 750.0000 mg | INTRAVENOUS | Status: DC

## 2012-01-23 MED ORDER — PANTOPRAZOLE SODIUM 20 MG PO TBEC
20.0000 mg | DELAYED_RELEASE_TABLET | Freq: Every day | ORAL | Status: DC
  Filled 2012-01-23: qty 1

## 2012-01-23 MED ORDER — PANTOPRAZOLE SODIUM 40 MG PO TBEC
40.0000 mg | DELAYED_RELEASE_TABLET | Freq: Every day | ORAL | Status: DC
  Administered 2012-01-23 – 2012-01-29 (×7): 40 mg via ORAL
  Filled 2012-01-23 (×7): qty 1

## 2012-01-23 MED ORDER — DEXTROSE 5 % IV SOLN: 1.0000 g | INTRAVENOUS | Status: DC

## 2012-01-23 MED ORDER — BIOTENE DRY MOUTH MT LIQD
15.0000 mL | Freq: Two times a day (BID) | OROMUCOSAL | Status: DC
  Administered 2012-01-23 – 2012-01-29 (×8): 15 mL via OROMUCOSAL

## 2012-01-23 MED ORDER — INSULIN ASPART 100 UNIT/ML ~~LOC~~ SOLN: 0.0000 [IU] | Freq: Every day | SUBCUTANEOUS | Status: DC

## 2012-01-23 NOTE — Progress Notes (Addendum)
MEDICATION RELATED CONSULT NOTE - INITIAL   Pharmacy Consult for Coumadin, Vancomycin, Elita Quick Indication: hx DVT/PE (diagnosed Dec 2012), empiric HCAP  Allergies  Allergen Reactions  . Novocain Other (See Comments)    Unknown    Patient Measurements: Height: 5\' 5"  (165.1 cm) Weight: 140 lb 3.4 oz (63.6 kg) IBW/kg (Calculated) : 57   Vital Signs: Temp: 98.6 F (37 C) (03/22 0935) Temp src: Oral (03/22 0935) BP: 124/52 mmHg (03/22 1100) Pulse Rate: 94  (03/22 1100) Intake/Output from previous day:   Intake/Output from this shift: Total I/O In: -  Out: 50 [Urine:50]  Labs: No results found for this basename: WBC:3,HGB:3,HCT:3,PLT:3,APTT:3;INR:3,CREATININE:3,LABCREA:3,CREATININE:3,CREAT24HRUR:3,MG:3,PHOS:3,ALBUMIN:3,PROT:3,ALBUMIN:3,AST:3,ALT:3,ALKPHOS:3,BILITOT:3,BILIDIR:3,IBILI:3 in the last 72 hours Estimated Creatinine Clearance: 12.5 ml/min (by C-G formula based on Cr of 3.87).   Microbiology: No results found for this or any previous visit (from the past 720 hour(s)).  Medical History: Past Medical History  Diagnosis Date  . Diabetes mellitus   . Hypercholesterolemia   . Hypothyroidism   . Colitis   . Chronic kidney disease   . Coronary artery disease   . Hypertension   . GERD (gastroesophageal reflux disease)   . Myocardial infarction   . Peripheral vascular disease   . COPD (chronic obstructive pulmonary disease)   . Pneumonia     Admit Complaint: presented to [facility] ER 3/22 with fatigue, purulent sputum, SOB, low grade fever, chills, and posterior CP>    Anticoagulation:  DVT/PE (diagnosed 10/2011), continue Coumadin, INR 2.6 at [facility]; per RN here -pure blood in urine, pt unsure of what her dose is and does not know dose, on bipap and slightly confused when speaking with her, she states she hasn't taken Coumadin today; plan to repeat LE dopplers   Infectious Disease:  Suspected HCAP; hx cdiff?(was on po vanc in dec), received Zosyn 3.375gm IV and  Zithromax 500mg  IV at 0530 at [facility], has not received vanc yet; WBC 18.9 at [place], Lactate 0.9, blood cultures sent at [facility] 3/22>> Elita Quick 3/22>> Zithromax 3/22>>  Cardiovascular:  Hx HLD/HTN/CAD/PVD-on asa  Endocrinology:  Hx DM (on SSI, home Lantus), hypothyroidism-continue Synthroid  Gastrointestinal / Nutrition:  Hx colitis, GERD-continue po ptx  Neurology:  NA  Nephrology:  Hx ESRD MWF, continue Phoslo; needs HD today, renal has not been by yet  Pulmonary:  Hx COPD (on 2L at home), on bipap currently  Hematology / Oncology:  H/h, plt ok from labs at [facility]  PTA Medication Issues:  Norvasc, clonidine, hydralazine, lopressor (from December, med rec has not been completed yet-f/u if remains same)  Best Practices:  Hep sq   Goal of Therapy:  Pre-HD level 15-25 mcg/ml  Plan:  1.  Vancomycin 1250mg  IV x 1 2.  F/U HD schedule and plan for vancomycin 750mg  IV after each HD 3.  Fortaz 1gm IV q 24hr 4.  F/U INR here and home dose (patient unable to tell me)  Rolland Porter, Pharm.D., BCPS Clinical Pharmacist Pager: [phone]   Addendum: I spoke with Dr. Jearl Klinefelter nurse to try to find out Coumadin dosing, she is unsure who follows her Coumadin.  They last saw her 3/14 and patient was taking 2 mg po daily.  Patient to receive dialysis right now, since only one peripheral IV, all antibiotics have not been given. Plan: 1.  Plan to give vancomycin 1250mg  IV once after HD today (HD aware) 2.  Schedule vancomycin 750mg  IV after each HD (MWF, will follow closely in case HD schedule changes) 3.  Fortaz hung prior to  HD, therefore will start scheduled Fortaz 1gm IV daily tonight 4.  Coumadin 2 mg po x 1 tonight (was experiencing some blood in urine, need to monitor) 5.  Daily PT/INR 6.  F/U Coumadin dosing once patient off bipap and able to communicate dosing regimen  Rolland Porter, Pharm.D., BCPS Clinical Pharmacist Pager: [phone]

## 2012-01-23 NOTE — H&P (Signed)
Name: [name] [name] MRN: [id number] DOB: [birth year]    LOS: 0  [facility]  History of Present Illness:  71 year old female w/ PMH ESRD, COPD (2 liters at home), recent DVT/PE diagnosed in December 2012 (s/p ~2 months in rehab: recently d/c to home in end of Feb 2013). Presented to ER at [facility] on 3/22 w/ 2 day h/o increasing fatigue, 1 day h/o cough w/ purulent sputum accompanied by progressive dyspnea, low grade fever, chills and posterior CP w/ cough. Did have sick exposures at HD center. In ER found hypoxic w/ dense RLL airspace opacification. Transferred to [facility] on 3/22 as for treatment of acute illness and Hemodialysis facilities.   Lines / Drains: Right tunneled HD cath (prior to admit)  Cultures: BCX2 3/22>>> Sputum 3/22>>> PCT 3/22>>>  Antibiotics: vanc (HCAP) 3/22>>> Ceftazidime (HCAP) 3/22>>> azithro (CAP) 3/22>>>  Tests / Events:     Past Medical History  Diagnosis Date  . Diabetes mellitus   . Hypercholesterolemia   . Hypothyroidism   . Colitis   . Chronic kidney disease   . Coronary artery disease   . Hypertension   . GERD (gastroesophageal reflux disease)   . Myocardial infarction   . Peripheral vascular disease   . COPD (chronic obstructive pulmonary disease)   . Pneumonia    Past Surgical History  Procedure Date  . Partial hysterectomy   . Tubal ligation   . Amputaton of right fifth toe   . Tonsillectomy   . Insertion of dialysis catheter 10/23/2011    Procedure: INSERTION OF DIALYSIS CATHETER;  Surgeon: Chuck Hint, MD;  Location: [facility] OR;  Service: Vascular;  Laterality: Right;  Right internal jugular Insertion diatek catheter  Recent hospitalization in Dec 2012: for acute resp failure: in setting of COPD, Presumed PE (had acute LE DVT) and volume overload. Home on LMWH and Coumadin    Prior to Admission medications from last discharge, awaiting updated list.   Medication Sig Start Date End Date Taking? Authorizing  Provider  amLODipine (NORVASC) 10 MG tablet Take 10 mg by mouth daily.      Historical Provider, MD  aspirin 81 MG chewable tablet Chew 1 tablet (81 mg total) by mouth daily. 11/01/11 10/31/12  Clydia Llano, MD  calcium acetate (PHOSLO) 667 MG capsule Take 1 capsule (667 mg total) by mouth 3 (three) times daily with meals. 11/01/11 10/31/12  Clydia Llano, MD  cloNIDine (CATAPRES) 0.2 MG tablet Take 0.2 mg by mouth 3 (three) times daily.      Historical Provider, MD  enoxaparin (LOVENOX) 60 MG/0.6ML SOLN Inject 0.55 mLs (55 mg total) into the skin daily. 11/01/11   Clydia Llano, MD  hydrALAZINE (APRESOLINE) 50 MG tablet Take 50 mg by mouth 2 (two) times daily.      Historical Provider, MD  insulin glargine (LANTUS) 100 UNIT/ML injection Inject 18 Units into the skin at bedtime. 11/01/11   Clydia Llano, MD  isosorbide mononitrate (IMDUR) 120 MG 24 hr tablet Take 120 mg by mouth daily.      Historical Provider, MD  levothyroxine (SYNTHROID, LEVOTHROID) 25 MCG tablet Take 25 mcg by mouth daily.      Historical Provider, MD  metoprolol (LOPRESSOR) 50 MG tablet Take 50 mg by mouth 2 (two) times daily.      Historical Provider, MD  nitroGLYCERIN (NITROSTAT) 0.4 MG SL tablet Place 0.4 mg under the tongue every 5 (five) minutes as needed. For chest pain.     Historical Provider, MD  pantoprazole (PROTONIX) 40 MG tablet Take 40 mg by mouth daily.      Historical Provider, MD  ranitidine (ZANTAC) 150 MG tablet Take 150 mg by mouth 2 (two) times daily.      Historical Provider, MD  vancomycin (VANCOCIN) 50 mg/mL oral solution Take 2.5 mLs (125 mg total) by mouth 4 (four) times daily. 11/01/11   Clydia Llano, MD    Allergies Allergies  Allergen Reactions  . Novocain Other (See Comments)    Unknown    Family History No family history on file.  Social History  reports that she quit smoking about 7 months ago. Her smoking use included Cigarettes. She has a 40 pack-year smoking history. She does not have  any smokeless tobacco history on file. She reports that she does not drink alcohol or use illicit drugs.  Review Of Systems   Review of Systems  Constitutional: No weight loss, gain, night sweats, + Fevers, + chills, + fatigue .  HEENT: No headaches, visual changes, Difficulty swallowing, Tooth/dental problems, or Sore throat,  No sneezing, itching, ear ache, + nasal congestion, + post nasal drip X2 days, no visual complaints CV: posterior/mid back chest pain, - Orthopnea, - PND, - swelling in lower extremities, - dizziness,- palpitations, - syncope.  GI No heartburn, indigestion, abdominal pain,+ nausea (in ER, - vomiting, - diarrhea, - change in bowel habits, loss of appetite, bloody stools.  Resp: productive cough (yellow sputum), No coughing up of blood. No wheezing.  Skin: no rash or itching or icterus + ecchymosis GU: HD patient MS: No joint pain or swelling. No decreased range of motion  Psych: No change in mood or affect. No depression or anxiety.  Neuro: no difficulty with speech, weakness, numbness, ataxia    Vital Signs: Pulse Rate:  [89] 89  (03/22 0943) Resp:  [17] 17  (03/22 0943) SpO2:  [95 %] 95 % (03/22 0943)         Physical Examination: General:  Chronically ill appearing white female, no acute distress. Appears comfortable off NIPPV but O2 sats drift down to high 80s Neuro:  Awake and oriented, no focal neurological deficits HEENT:  No JVD, MM are dry Cardiovascular:  Rrr, RUE AVG: good bruit and thrill Lungs:  Crackles predominately on right, no sig accessory muscle use, does have prolonged faint expiratory wheeze  Abdomen:  Soft, non-tender, no OM Musculoskeletal:  Intact Skin: multiple areas of ecchymosis  Ventilator settings:    Labs and Imaging:  No results found for this basename: NA:3,K:3,CL:3,CO2:3,BUN:3,CREATININE:3,GLUCOSE:3 in the last 168 hours No results found for this basename: HGB:3,HCT:3,WBC:3,PLT:3 in the last 168 hours  Assessment and  Plan: Acute-on-chronic respiratory failure in setting of Presumed HCAP (healthcare-associated pneumonia), likely complicated by underlying COPD, and volume overload.  Plan -pan culture, trend PCT -empiric HCAP coverage -pulm hygiene -will hold off from systemic steroids -BIPAP for respiratory failure, will attempt to get off after dialysis -HD to keep volume on dry side.   History of DVT (deep vein thrombosis) and History of pulmonary embolus (PE) No results found for this basename: INR:3 in the last 168 hours Plan: -repeat LE dopplers -cont coumadin per pharmacy   ESRD (end stage renal disease) on dialysis: M/W/F schedule: Think she would benefit from volume removal given her hypoxia.  No results found for this basename: CREATININE:3,K:3,CO2:3 in the last 168 hours Plan: -Nephrology consultation -follow chemistry   Diabetes mellitus CBG (last 3)  No results found for this basename: GLUCAP:3 in the last 72  hours Plan: -ssi  Hypothyroidism Lab Results  Component Value Date   TSH 1.664 10/12/2011  plan: -check TSH/cont supplementation  HTN (hypertension): currently SBP stable in 119-120 range Plan -hold antihypertensives to allow volume removal w/ HD   Anemia: due to chronic disease.  No results found for this basename: HGB:3 in the last 168 hours Plan: -ppi -[place] heparin  Hematuria Plan -check INR Sent UA  Best practices / Disposition: -->ICU status under PCCM -->full code -->Heparin for DVT Px -->Protonix for GI Px -->ventilator bundle -->diet -->family updated at bedside  The patient is critically ill with multiple organ systems failure and requires high complexity decision making for assessment and support, frequent evaluation and titration of therapies, application of advanced monitoring technologies and extensive interpretation of multiple databases. Critical Care Time devoted to patient care services described in this note is 45  minutes.  BABCOCK,PETE 01/23/2012, 10:03 AM  Will continue BiPAP for now until dialysis is complete (renal to take off 2 L), continue abx and will re-evaluate in the afternoon.  CC time 60 minutes.  Patient seen and examined, agree with above note.  I dictated the care and orders written for this patient under my direction.  Koren Bound, M.D. [phone]

## 2012-01-23 NOTE — Consult Note (Signed)
Referring Provider: No ref. provider found Primary Care Physician:  Quentin Mulling, MD, MD Primary Nephrologist:  Dr. 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 Diabetes mellitus   . Hypercholesterolemia   . Hypothyroidism   . Colitis   . Chronic kidney disease   . Coronary artery disease   . Hypertension   . GERD (gastroesophageal reflux disease)   . Myocardial infarction   . Peripheral vascular disease   . COPD (chronic obstructive pulmonary disease)   . Pneumonia     Past Surgical History  Procedure Date  . Partial hysterectomy   . Tubal ligation   . Amputaton of right fifth toe   . Tonsillectomy   . Insertion of dialysis catheter 10/23/2011    Procedure: INSERTION OF DIALYSIS CATHETER;  Surgeon: Chuck Hint, MD;  Location: [facility] OR;  Service: Vascular;  Laterality: Right;  Right internal jugular Insertion diatek catheter    Prior to Admission medications   Medication Sig Start Date End Date Taking? Authorizing Provider  amLODipine (NORVASC) 10 MG tablet Take 10 mg by mouth daily.      Historical Provider, MD  aspirin 81 MG chewable tablet Chew 1 tablet (81 mg total) by mouth daily. 11/01/11 10/31/12  Clydia Llano, MD  calcium acetate (PHOSLO) 667 MG capsule Take 1 capsule (667 mg total) by mouth 3 (three) times daily with meals. 11/01/11 10/31/12  Clydia Llano, MD  cloNIDine (CATAPRES) 0.2 MG tablet Take 0.2 mg by mouth 3 (three) times daily.      Historical Provider, MD  enoxaparin (LOVENOX) 60 MG/0.6ML SOLN Inject 0.55 mLs (55 mg total) into the skin daily. 11/01/11   Clydia Llano, MD  hydrALAZINE (APRESOLINE) 50 MG tablet Take 50 mg by mouth 2 (two)  times daily.      Historical Provider, MD  insulin glargine (LANTUS) 100 UNIT/ML injection Inject 18 Units into the skin at bedtime. 11/01/11   Clydia Llano, MD  isosorbide mononitrate (IMDUR) 120 MG 24 hr tablet Take 120 mg by mouth daily.      Historical Provider, MD  levothyroxine (SYNTHROID, LEVOTHROID) 25 MCG tablet Take 25 mcg by mouth daily.      Historical Provider, MD  metoprolol (LOPRESSOR) 50 MG tablet Take 50 mg by mouth 2 (two) times daily.      Historical Provider, MD  nitroGLYCERIN (NITROSTAT) 0.4 MG SL tablet Place 0.4 mg under the tongue every 5 (five) minutes as needed. For chest pain.     Historical Provider, MD  pantoprazole (PROTONIX) 40 MG tablet Take 40 mg by mouth daily.      Historical Provider, MD  ranitidine (ZANTAC) 150 MG tablet Take 150 mg by mouth 2 (two) times daily.      Historical Provider, MD  vancomycin (VANCOCIN) 50 mg/mL oral solution Take 2.5 mLs (125 mg total) by mouth 4 (four) times daily. 11/01/11   Clydia Llano, MD    Current Facility-Administered Medications  Medication Dose Route Frequency Provider Last Rate Last Dose  . 0.9 %  sodium chloride infusion  250 mL Intravenous PRN Simonne Martinet, NP      . 0.9 %  sodium chloride infusion  250 mL Intravenous PRN  Simonne Martinet, NP      . antiseptic oral rinse (BIOTENE) solution 15 mL  15 mL Mouth Rinse q12n4p Leslye Peer, MD      . aspirin chewable tablet 81 mg  81 mg Oral Daily Simonne Martinet, NP      . azithromycin (ZITHROMAX) 500 mg in dextrose 5 % 250 mL IVPB  500 mg Intravenous Q24H Elonda Husky, PHARMD      . calcium acetate (PHOSLO) capsule 667 mg  667 mg Oral TID WC Simonne Martinet, NP      . cefTAZidime (FORTAZ) 1 g in dextrose 5 % 50 mL IVPB  1 g Intravenous Once Elonda Husky, PHARMD      . cefTAZidime (FORTAZ) 1 g in dextrose 5 % 50 mL IVPB  1 g Intravenous Q24H Elonda Husky, PHARMD      . chlorhexidine (PERIDEX) 0.12 % solution 15 mL  15 mL Mouth Rinse BID Leslye Peer, MD       . heparin injection 1,300 Units  20 Units/kg Dialysis PRN [name], MD      . heparin injection 5,000 Units  5,000 Units Subcutaneous Q8H Simonne Martinet, NP      . insulin aspart (novoLOG) injection 0-15 Units  0-15 Units Subcutaneous TID WC Simonne Martinet, NP      . insulin aspart (novoLOG) injection 0-5 Units  0-5 Units Subcutaneous QHS Simonne Martinet, NP      . insulin aspart (novoLOG) injection 3 Units  3 Units Subcutaneous TID WC Simonne Martinet, NP      . insulin glargine (LANTUS) injection 18 Units  18 Units Subcutaneous QHS Simonne Martinet, NP      . levothyroxine (SYNTHROID, LEVOTHROID) tablet 25 mcg  25 mcg Oral Daily Simonne Martinet, NP      . nitroGLYCERIN (NITROSTAT) SL tablet 0.4 mg  0.4 mg Sublingual Q5 min PRN Simonne Martinet, NP      . pantoprazole (PROTONIX) EC tablet 40 mg  40 mg Oral Daily Simonne Martinet, NP      . sodium chloride 0.9 % injection 3 mL  3 mL Intravenous Q12H Simonne Martinet, NP      . sodium chloride 0.9 % injection 3 mL  3 mL Intravenous PRN Simonne Martinet, NP      . vancomycin (VANCOCIN) 1,250 mg in sodium chloride 0.9 % 250 mL IVPB  1,250 mg Intravenous Once Elonda Husky, PHARMD      . DISCONTD: famotidine (PEPCID) tablet 20 mg  20 mg Oral BID Simonne Martinet, NP      . DISCONTD: pantoprazole (PROTONIX) EC tablet 20 mg  20 mg Oral Q1200 Simonne Martinet, NP        Allergies as of 01/23/2012 - Review Complete 01/23/2012  Allergen Reaction Noted  . Novocain Other (See Comments) 06/12/2011    History reviewed. No pertinent family history.  History   Social History  . Marital Status: Widowed    Spouse Name: N/A    Number of Children: N/A  . Years of Education: N/A   Occupational History  . Not on file.   Social History Main Topics  . Smoking status: Former Smoker -- 1.0 packs/day for 40 years    Types: Cigarettes    Quit date: 06/11/2011  . Smokeless tobacco: Not on file  . Alcohol Use: No  . Drug Use: No  . Sexually Active:  Yes  Birth Control/ Protection: Post-menopausal   Other Topics Concern  . Not on file   Social History Narrative  . No narrative on file    Review of Systems: Gen: + weak and fatigue +fever and chills HEENT: No visual complaints, No history of Retinopathy. Normal external appearance No Epistaxis or Sore throat. No sinusitis.   CV: Denies chest pain, angina, palpitations, syncope, orthopnea, PND, peripheral edema, and claudication.+ History of MI Resp: Cough sputum yellow no blood history of COPD GI: Denies vomiting blood, jaundice, and fecal incontinence.   Denies dysphagia or odynophagia. GU : End stage renal disease MS: Denies joint pain, limitation of movement, and swelling, stiffness, low back pain, extremity pain. Denies muscle weakness, cramps, atrophy.  No use of non steroidal antiinflammatory drugs. Derm: Bruising Psych: Denies depression, anxiety, memory loss, suicidal ideation, hallucinations, paranoia, and confusion. Heme: +bruising, no bleeding, and enlarged lymph nodes. Neuro: No headache.  No diplopia. No dysarthria.  No dysphasia.  No history of CVA.  No Seizures. No paresthesias.  No weakness. Endocrine+ DM.  + Thyroid disease.  No Adrenal disease.  Physical Exam: Vital signs in last 24 hours: Temp:  [98.6 F (37 C)] 98.6 F (37 C) (03/22 0935) Pulse Rate:  [89-94] 94  (03/22 1100) Resp:  [17-21] 17  (03/22 1100) BP: (121-140)/(52-56) 124/52 mmHg (03/22 1100) SpO2:  [90 %-97 %] 97 % (03/22 1100) FiO2 (%):  [60 %] 60 % (03/22 1047) Weight:  [63.6 kg (140 lb 3.4 oz)] 63.6 kg (140 lb 3.4 oz) (03/22 0935)   General:  Chronically ill Head:  Normocephalic and atraumatic. Eyes:  Sclera clear, no icterus.   Conjunctiva pink. Ears:  Normal auditory acuity. Nose:  No deformity, discharge,  or lesions. Mouth:  No deformity or lesions, dentition normal. Neck:  Supple; no masses or thyromegaly. JVP not elevated Lungs:  BIPAP with prolonged expiration Heart:  Regular  rate and rhythm; no murmurs, clicks, rubs,  or gallops. Abdomen:  Soft, nontender and nondistended. No masses, hepatosplenomegaly or hernias noted. Normal bowel sounds, without guarding, and without rebound.   Msk:  Symmetrical without gross deformities. Normal posture. Pulses:  No carotid, renal, femoral bruits. DP and PT symmetrical and equal Extremities:  Without clubbing or edema. Neurologic:  Alert and  oriented x4;  grossly normal neurologically. Skin:  Intact without significant lesions or rashes. Cervical Nodes:  No significant cervical adenopathy. Psych:  Alert and cooperative. Normal mood and affect.  Intake/Output from previous day:   Intake/Output this shift: Total I/O In: -  Out: 50 [Urine:50]  Lab Results: No results found for this basename: WBC:3,HGB:3,HCT:3,PLT:3 in the last 72 hours BMET No results found for this basename: NA:3,K:3,CL:3,CO2:3,GLUCOSE:3,BUN:3,CREATININE:3,CALCIUM:3,MAG:3,PHOS:3 in the last 72 hours LFT No results found for this basename: PROT,ALBUMIN,AST,ALT,ALKPHOS,BILITOT,BILIDIR,IBILI in the last 72 hours PT/INR No results found for this basename: LABPROT:2,INR:2 in the last 72 hours Hepatitis Panel No results found for this basename: HEPBSAG,HCVAB,HEPAIGM,HEPBIGM in the last 72 hours  Studies/Results: Portable Chest Xray  01/23/2012  *RADIOLOGY REPORT*  Clinical Data: Increased shortness of breath  PORTABLE CHEST - 1 VIEW  Comparison: Portable chest x-ray of 01/23/2012  Findings: There has been some improvement in airspace disease at the right lung base and in the right mid lung.  A small right effusion remains.  Mild cardiomegaly is stable.  There is still evidence of mild pulmonary vascular congestion.  Right central venous line is unchanged in position.  IMPRESSION: Some improvement in airspace disease.  Persistent cardiomegaly, right effusion, and  mild pulmonary vascular congestion.  Original Report Authenticated By: Juline Patch, M.D.     Assessment/Plan:  ESRD-MWF dialysis [place]  ANEMIA-No Epo,  Iron every week  MBD-Zemplar 1 mcg calcium binders will follow  HTN/VOL-hold BP meds  UF today  ACCESS-AVF Right and Catheter may cannulate  Pneumonia- Azithromycin , Fortaz and Vancomycin   LOS: 0 Janeah Kovacich W @TODAY @11 :31 AM

## 2012-01-23 NOTE — Progress Notes (Signed)
PHARMACIST - PHYSICIAN COMMUNICATION DR:   byrum CONCERNING:  Both protonix and pepcid ordered   RECOMMENDATION: I kept the protonix and dc pepcid

## 2012-01-24 ENCOUNTER — Inpatient Hospital Stay (HOSPITAL_COMMUNITY): Payer: Medicare Other

## 2012-01-24 DIAGNOSIS — N186 End stage renal disease: Secondary | ICD-10-CM

## 2012-01-24 DIAGNOSIS — J810 Acute pulmonary edema: Secondary | ICD-10-CM

## 2012-01-24 DIAGNOSIS — J189 Pneumonia, unspecified organism: Secondary | ICD-10-CM

## 2012-01-24 DIAGNOSIS — J9600 Acute respiratory failure, unspecified whether with hypoxia or hypercapnia: Secondary | ICD-10-CM

## 2012-01-24 LAB — BASIC METABOLIC PANEL
CO2: 28 mEq/L (ref 19–32)
Chloride: 96 mEq/L (ref 96–112)
Glucose, Bld: 127 mg/dL — ABNORMAL HIGH (ref 70–99)
Sodium: 135 mEq/L (ref 135–145)

## 2012-01-24 LAB — CARDIAC PANEL(CRET KIN+CKTOT+MB+TROPI)
CK, MB: 0.9 ng/mL (ref 0.3–4.0)
Total CK: 22 U/L (ref 7–177)

## 2012-01-24 LAB — MAGNESIUM: Magnesium: 1.8 mg/dL (ref 1.5–2.5)

## 2012-01-24 LAB — GLUCOSE, CAPILLARY
Glucose-Capillary: 105 mg/dL — ABNORMAL HIGH (ref 70–99)
Glucose-Capillary: 156 mg/dL — ABNORMAL HIGH (ref 70–99)

## 2012-01-24 LAB — CBC
Hemoglobin: 10.5 g/dL — ABNORMAL LOW (ref 12.0–15.0)
MCH: 27 pg (ref 26.0–34.0)
MCV: 87.7 fL (ref 78.0–100.0)
Platelets: 349 10*3/uL (ref 150–400)
RBC: 3.89 MIL/uL (ref 3.87–5.11)
WBC: 15.6 10*3/uL — ABNORMAL HIGH (ref 4.0–10.5)

## 2012-01-24 LAB — PHOSPHORUS: Phosphorus: 2 mg/dL — ABNORMAL LOW (ref 2.3–4.6)

## 2012-01-24 LAB — PROTIME-INR: INR: 2.75 — ABNORMAL HIGH (ref 0.00–1.49)

## 2012-01-24 MED ORDER — PARICALCITOL 5 MCG/ML IV SOLN
1.0000 ug | INTRAVENOUS | Status: DC
  Administered 2012-01-26 – 2012-01-28 (×2): 1 ug via INTRAVENOUS
  Filled 2012-01-24 (×2): qty 0.2

## 2012-01-24 MED ORDER — WARFARIN SODIUM 2 MG PO TABS
2.0000 mg | ORAL_TABLET | Freq: Once | ORAL | Status: DC
  Filled 2012-01-24: qty 1

## 2012-01-24 NOTE — Progress Notes (Signed)
Pt profile: 71 year old female w/ PMH ESRD, COPD, recent DVT/PE diagnosed in December 2012. Presented to ER at [facility] on 3/22 w/ 2 day h/o increasing fatigue, 1 day h/o cough, purulent sputum, progressive dyspnea, fever and pleuritic CP. In ER hypoxic with RLL opacification. Transferred to [facility] on 3/22 as for treatment of acute illness and Hemodialysis facilities  Active problems: 1) ESRD 2) Presumed HCAP (recent hospitalization and rehab) 3) DMII 4) recent VTE  Cultures:  BCX2 3/22>>>  Sputum 3/22>>>  PCT 3/22: 0.55  Antibiotics:  azithro (CAP) 3/22>> 3/23 vanc (HCAP) 3/22>>>  Ceftazidime (HCAP) 3/22>>>    Subj: No distress. No new complaints. Still requiring 6 lpm [place]  Obj: Filed Vitals:   01/24/12 1200  BP: 110/48  Pulse: 109  Temp: 98.6 F (37 C)  Resp: 23    Gen: Chronically ill. NAD HEENT: WNL Neck: No jvd noted Chest: Clear anteriorly, R>L basilar rales Cardiac: RRR s M Abd: NABS, soft, NT Ext: no edema  BMET    Component Value Date/Time   NA 135 01/24/2012 0620   K 3.5 01/24/2012 0620   CL 96 01/24/2012 0620   CO2 28 01/24/2012 0620   GLUCOSE 127* 01/24/2012 0620   BUN 10 01/24/2012 0620   CREATININE 2.19* 01/24/2012 0620   CALCIUM 8.9 01/24/2012 0620   GFRNONAA 22* 01/24/2012 0620   GFRAA 25* 01/24/2012 0620    CBC    Component Value Date/Time   WBC 15.6* 01/24/2012 0620   RBC 3.89 01/24/2012 0620   HGB 10.5* 01/24/2012 0620   HCT 34.1* 01/24/2012 0620   PLT 349 01/24/2012 0620   MCV 87.7 01/24/2012 0620   MCH 27.0 01/24/2012 0620   MCHC 30.8 01/24/2012 0620   RDW 17.0* 01/24/2012 0620   LYMPHSABS 1.9 10/21/2011 0350   MONOABS 1.5* 10/21/2011 0350   EOSABS 0.4 10/21/2011 0350   BASOSABS 0.2* 10/21/2011 0350    CXR:  3/23: NSC bibasilar opacities  IMPRESSION: Presumed HCAP (recent hospitalization and rehab) -Legionella unlikely. D/C Azithro. Cont Vanc/Ceftaz  ESRD  -Mgmt per Renal   DMII - Adequately controlled. Cont same  Recent VTE -  Cont warfarin per pharmacy   Transfer to SDU 3/23  Billy Fischer, MD;  PCCM service; Mobile [phone]

## 2012-01-24 NOTE — Progress Notes (Signed)
Subjective:  Breathing is better. Get OOB to chair for first time today.  Eating OK.    Objective:    Vital signs in last 24 hours: Filed Vitals:   01/24/12 0800 01/24/12 0900 01/24/12 1000 01/24/12 1200  BP: 138/48 108/46 100/50 110/48  Pulse: 105 106 106 109  Temp: 99.4 F (37.4 C)   98.6 F (37 C)  TempSrc: Oral   Oral  Resp: 23 26 19 23   Height:      Weight:      SpO2: 92% 90% 94% 91%   Weight change:   Intake/Output Summary (Last 24 hours) at 01/24/12 1327 Last data filed at 01/24/12 1000  Gross per 24 hour  Intake    650 ml  Output   3025 ml  Net  -2375 ml   Labs: Basic Metabolic Panel:  Lab 01/24/12 9604 01/23/12 1131  NA 135 131*  K 3.5 4.8  CL 96 90*  CO2 28 28  GLUCOSE 127* 262*  BUN 10 31*  CREATININE 2.19* 3.68*  ALB -- --  CALCIUM 8.9 9.8  PHOS 2.0* 3.7   Liver Function Tests:  Lab 01/23/12 1131  AST 11  ALT 7  ALKPHOS 101  BILITOT 0.4  PROT 7.3  ALBUMIN 2.7*   No results found for this basename: LIPASE:3,AMYLASE:3 in the last 168 hours No results found for this basename: AMMONIA:3 in the last 168 hours CBC:  Lab 01/24/12 0620 01/23/12 1131  WBC 15.6* 17.7*  NEUTROABS -- --  HGB 10.5* 10.9*  HCT 34.1* 35.1*  MCV 87.7 86.5  PLT 349 351   Cardiac Enzymes:  Lab 01/24/12 0200 01/23/12 1758 01/23/12 1131  CKTOTAL 22 18 21   CKMB 0.9 0.8 1.0  CKMBINDEX -- -- --  TROPONINI <0.30 <0.30 <0.30   CBG:  Lab 01/24/12 1130 01/24/12 0721 01/23/12 2139 01/23/12 1632 01/23/12 1212  GLUCAP 174* 156* 105* 167* 232*      I  have reviewed scheduled and prn medications.  Physical Exam:  Blood pressure 110/48, pulse 109, temperature 98.6 F (37 C), temperature source Oral, resp. rate 23, height 5\' 5"  (1.651 m), weight 61.4 kg (135 lb 5.8 oz), SpO2 91.00%.   General: alert, up in chair , no distress Neck: Supple; no masses or thyromegaly. JVP not elevated  Lungs: [place] oxygen, decreased air movement througohut, but no wheezing Heart: Regular rate  and rhythm; no murmurs, clicks, rubs, or gallops.  Abdomen: Soft, nontender and nondistended. No masses, hepatosplenomegaly or hernias noted. Normal bowel sounds, without guarding, and without rebound.  Msk: Symmetrical without gross deformities.  Extremities: Without clubbing or edema.  Neurologic: Alert and oriented x4; grossly normal neurologically.  Skin: Intact without significant lesions or rashes.  Psych: Alert and cooperative. Normal mood and affect.  Assessment/Plan:  1. ESRD-MWF dialysis [place]- had HD yesterday without incident. 2800cc off, next HD Monday 2. ANEMIA-No Epo, Iron every week  3. MBD-Zemplar 1 mcg, Phos low > d/c phoslo for now (was on 3ac at home, 1ac here) 4. HTN/VOL-holding BP meds, on 4 at home (BB, clonidine, hydralazine, amlodipine) 5. ACCESS-AVF Right and Catheter may cannulate  6. Pneumonia/COPD- Elita Quick and Vancomycin  [name]  MD [facility] [phone] pgr    [phone] cell 01/24/2012, 1:27 PM

## 2012-01-24 NOTE — Progress Notes (Signed)
ANTICOAGULATION CONSULT NOTE - Follow Up Consult  Pharmacy Consult for coumadin Indication: recent dvt/pe  Allergies  Allergen Reactions  . Novocain Other (See Comments)    Unknown    Patient Measurements: Height: 5\' 5"  (165.1 cm) Weight: 135 lb 5.8 oz (61.4 kg) IBW/kg (Calculated) : 57  Heparin Dosing Weight:   Vital Signs: Temp: 98.6 F (37 C) (03/23 1200) Temp src: Oral (03/23 1200) BP: 110/48 mmHg (03/23 1200) Pulse Rate: 109  (03/23 1200)  Labs:  Basename 01/24/12 0620 01/24/12 0200 01/23/12 1758 01/23/12 1131  HGB 10.5* -- -- 10.9*  HCT 34.1* -- -- 35.1*  PLT 349 -- -- 351  APTT -- -- -- --  LABPROT 29.5* -- -- 27.0*  INR 2.75* -- -- 2.45*  HEPARINUNFRC -- -- -- --  CREATININE 2.19* -- -- 3.68*  CKTOTAL -- 22 18 21   CKMB -- 0.9 0.8 1.0  TROPONINI -- <0.30 <0.30 <0.30   Estimated Creatinine Clearance: 22.1 ml/min (by C-G formula based on Cr of 2.19).   70YOF w/PMH ESRD, COPD, DM, recent VTE in 10/2011, presented to [facility] ER 3/22 w/Sx PNA. Xferred to [facility] for acute illness and HD facilities. Continuing Coumadin, got 1 dose yesterday, but has been having very bloody urine and nurse also noted a lot of bleeding at IV lines yesterday. INR jumped by 0.3 points with 1 dose yesterday, INR remains at goal. BP has been dropping during the day, but stable now. Hgb low but holding steady.  Goal of Therapy:  INR 2-3  1. Hold Coumadin today. Pt with bloody urine and considerable blood noted at IV lines per nursing. 2. F/u INR tomorrow. 3. Would recommend adding back clonidine once BP stable to avoid rebound HTN.  Zoe Lan, PharmD pgr [phone] 01/24/2012, 4:15 PM

## 2012-01-24 NOTE — Progress Notes (Signed)
Pt transferred to [id number] , VSS.

## 2012-01-25 LAB — CBC
HCT: 39.1 % (ref 36.0–46.0)
Hemoglobin: 12.4 g/dL (ref 12.0–15.0)
MCHC: 31.7 g/dL (ref 30.0–36.0)
MCV: 86.3 fL (ref 78.0–100.0)
RDW: 16.8 % — ABNORMAL HIGH (ref 11.5–15.5)

## 2012-01-25 LAB — BASIC METABOLIC PANEL
BUN: 27 mg/dL — ABNORMAL HIGH (ref 6–23)
Creatinine, Ser: 3.85 mg/dL — ABNORMAL HIGH (ref 0.50–1.10)
GFR calc Af Amer: 13 mL/min — ABNORMAL LOW (ref >90)
GFR calc non Af Amer: 11 mL/min — ABNORMAL LOW (ref >90)
Glucose, Bld: 188 mg/dL — ABNORMAL HIGH (ref 70–99)

## 2012-01-25 LAB — GLUCOSE, CAPILLARY
Glucose-Capillary: 113 mg/dL — ABNORMAL HIGH (ref 70–99)
Glucose-Capillary: 167 mg/dL — ABNORMAL HIGH (ref 70–99)
Glucose-Capillary: 208 mg/dL — ABNORMAL HIGH (ref 70–99)

## 2012-01-25 LAB — LEGIONELLA ANTIGEN, URINE: Legionella Antigen, Urine: NEGATIVE

## 2012-01-25 MED ORDER — WARFARIN SODIUM 2 MG PO TABS
2.0000 mg | ORAL_TABLET | Freq: Once | ORAL | Status: AC
  Administered 2012-01-25: 2 mg via ORAL
  Filled 2012-01-25: qty 1

## 2012-01-25 MED ORDER — CLONIDINE HCL 0.2 MG PO TABS
0.2000 mg | ORAL_TABLET | Freq: Three times a day (TID) | ORAL | Status: DC
  Administered 2012-01-25 – 2012-01-29 (×7): 0.2 mg via ORAL
  Filled 2012-01-25 (×15): qty 1

## 2012-01-25 MED ORDER — AMLODIPINE BESYLATE 10 MG PO TABS
10.0000 mg | ORAL_TABLET | Freq: Every day | ORAL | Status: DC
  Administered 2012-01-25 – 2012-01-29 (×5): 10 mg via ORAL
  Filled 2012-01-25 (×5): qty 1

## 2012-01-25 NOTE — Progress Notes (Signed)
Subjective:  Breathing is better. Eating OK.  Gross hematuria, started yesterday. No abd pain or dysuria. UCx + for Ecoli.  Objective:    Vital signs in last 24 hours: Filed Vitals:   01/25/12 0723 01/25/12 0800 01/25/12 1129 01/25/12 1200  BP:  147/56  145/65  Pulse:  109    Temp: 97.5 F (36.4 C)  98.4 F (36.9 C)   TempSrc: Oral  Oral   Resp:  23  19  Height:      Weight:      SpO2:  87%     Weight change: -2.6 kg (-5 lb 11.7 oz)  Intake/Output Summary (Last 24 hours) at 01/25/12 1411 Last data filed at 01/25/12 1200  Gross per 24 hour  Intake    730 ml  Output    325 ml  Net    405 ml   Labs: Basic Metabolic Panel:  Lab 01/25/12 1610 01/24/12 0620 01/23/12 1131  NA 131* 135 131*  K 3.6 3.5 4.8  CL 92* 96 90*  CO2 22 28 28   GLUCOSE 188* 127* 262*  BUN 27* 10 31*  CREATININE 3.85* 2.19* 3.68*  ALB -- -- --  CALCIUM 9.7 8.9 9.8  PHOS -- 2.0* 3.7   Liver Function Tests:  Lab 01/23/12 1131  AST 11  ALT 7  ALKPHOS 101  BILITOT 0.4  PROT 7.3  ALBUMIN 2.7*   No results found for this basename: LIPASE:3,AMYLASE:3 in the last 168 hours No results found for this basename: AMMONIA:3 in the last 168 hours CBC:  Lab 01/25/12 1031 01/24/12 0620 01/23/12 1131  WBC 16.5* 15.6* 17.7*  NEUTROABS -- -- --  HGB 12.4 10.5* 10.9*  HCT 39.1 34.1* 35.1*  MCV 86.3 87.7 86.5  PLT 395 349 351   Cardiac Enzymes:  Lab 01/24/12 0200 01/23/12 1758 01/23/12 1131  CKTOTAL 22 18 21   CKMB 0.9 0.8 1.0  CKMBINDEX -- -- --  TROPONINI <0.30 <0.30 <0.30   CBG:  Lab 01/25/12 1128 01/25/12 0721 01/24/12 2305 01/24/12 1642 01/24/12 1130  GLUCAP 167* 113* 150* 181* 174*      I  have reviewed scheduled and prn medications.  Physical Exam:  Blood pressure 145/65, pulse 109, temperature 98.4 F (36.9 C), temperature source Oral, resp. rate 19, height 5\' 5"  (1.651 m), weight 61 kg (134 lb 7.7 oz), SpO2 87.00%.   General: alert, up in chair , no distress Neck: Supple; no masses  or thyromegaly. JVP not elevated  Lungs:  oxygen, decreased air movement througohut, but no wheezing Heart: Regular rate and rhythm; no murmurs, clicks, rubs, or gallops.  Abdomen: Soft, nontender and nondistended. No masses, hepatosplenomegaly or hernias noted. Normal bowel sounds, without guarding, and without rebound.  Msk: Symmetrical without gross deformities.  Extremities: Without clubbing or edema.  Neurologic: Alert and oriented x4; grossly normal neurologically.  Skin: Intact without significant lesions or rashes.  Psych: Alert and cooperative. Normal mood and affect.  Assessment/Plan:  1. ESRD-MWF dialysis [place], HD tomorrow. 2. ANEMIA-No Epo, Iron every week  3. MBD-Zemplar 1 mcg, Phos low > d/c phoslo for now (was on 3ac at home) 4. HTN/VOL-holding BP meds, on 4 at home (BB, clonidine, hydralazine, amlodipine). BP up, will resume norvasc and clonidine today. 5. ACCESS-AVF Right and Catheter may cannulate  6. Pneumonia/COPD- Vanc d/c'd, Elita Quick continued (UTI also) 7. E Coli UTI- sensitivities pending. +gross hematuria related to UTI/coumadin most likely. Would not stop coumadin unless bleeding is excessive.  8. Coumadin AC- INR >  2  [name]  MD [employer] [phone] pgr    [phone] cell 01/25/2012, 2:11 PM

## 2012-01-25 NOTE — Progress Notes (Signed)
Pt profile: 71 year old female w/ PMH ESRD, COPD, recent DVT/PE diagnosed in December 2012. Presented to ER at [facility] on 3/22 w/ 2 day h/o increasing fatigue, 1 day h/o cough, purulent sputum, progressive dyspnea, fever and pleuritic CP. In ER hypoxic with RLL opacification. Transferred to [facility] on 3/22 as for treatment of acute illness and Hemodialysis facilities  Active problems: 1) ESRD 2) Presumed HCAP (recent hospitalization and rehab) 3) DMII 4) recent VTE  Cultures:  PCT 3/22: 0.55 Sputum 3/22 >> not obtainable BCX2 3/22 >> ngtd 3/24 >> Urine 3/22 >> E coli (sens pending) >>    Antibiotics:  azithro (CAP) 3/22>> 3/23 vanc (HCAP) 3/22  >> 3/24 Ceftazidime (HCAP) 3/22 >>    Subj: Comfortable on [place] O2. Notes mild hematuria.   Obj: Filed Vitals:   01/25/12 1129  BP:   Pulse:   Temp: 98.4 F (36.9 C)  Resp:     Gen: Chronically ill. NAD HEENT: WNL Neck: No jvd noted Chest: Clear anteriorly, R>L basilar rales Cardiac: RRR s M Abd: NABS, soft, NT Ext: no edema  BMET    Component Value Date/Time   NA 131* 01/25/2012 1031   K 3.6 01/25/2012 1031   CL 92* 01/25/2012 1031   CO2 22 01/25/2012 1031   GLUCOSE 188* 01/25/2012 1031   BUN 27* 01/25/2012 1031   CREATININE 3.85* 01/25/2012 1031   CALCIUM 9.7 01/25/2012 1031   GFRNONAA 11* 01/25/2012 1031   GFRAA 13* 01/25/2012 1031    CBC    Component Value Date/Time   WBC 16.5* 01/25/2012 1031   RBC 4.53 01/25/2012 1031   HGB 12.4 01/25/2012 1031   HCT 39.1 01/25/2012 1031   PLT 395 01/25/2012 1031   MCV 86.3 01/25/2012 1031   MCH 27.4 01/25/2012 1031   MCHC 31.7 01/25/2012 1031   RDW 16.8* 01/25/2012 1031   LYMPHSABS 1.9 10/21/2011 0350   MONOABS 1.5* 10/21/2011 0350   EOSABS 0.4 10/21/2011 0350   BASOSABS 0.2* 10/21/2011 0350    CXR:  No new CXR  IMPRESSION: Presumed HCAP (recent hospitalization and rehab) -Legionella unlikely. D/C'd Azithro 3/23. No evidence of resistant Gram positive organism. D/C Vanc 3/24.  Cont Ceftaz  Likely UTI UA dirty, E coli on cx, sens pending, hx of ESBL Cont Ceftaz  ESRD  -Mgmt per Renal   DMII - Adequately controlled. Cont same  Recent VTE - Cont warfarin per pharmacy  Mild hematuria Likely due to cystitis plus warfarin Cont to monitor  Transfer to Reg Bed 3/24. Transfer to [facility] beginning AM 3/25 and PCCM will sign off. Please call if we can be of further assistance  Billy Fischer, MD;  PCCM service; Mobile [phone]

## 2012-01-25 NOTE — Progress Notes (Signed)
ANTICOAGULATION CONSULT NOTE - Follow Up Consult  Pharmacy Consult for coumadin Indication: recent dvt/pe  Assessment 70YOF w/PMH ESRD, COPD, DM, recent VTE in 10/2011, presented to [facility] ER 3/22 w/Sx PNA. Xferred to [facility] for acute illness and HD facilities. Now growing E.Coli in urine Cx, UA +nitrites and leukocytes and hematuria.   Continued on Coumadin for recent VTE, yesterday's dose held for gross hematuria - noted to be mild bleeding today. Per renal, okay to give coumadin so long as pt is not hemorrhaging. Some bleeding is to be expected with UTI. INR at lower end of goal today, CBC increasing, which is encouraging.  Goal of Therapy INR 2-3  Plan 1. Coumadin 2mg  PO x1.  2. Will monitor hematuria and CBC closely. 3. Will f/u INR tomorrow.  Zoe Lan, PharmD pgr [phone] 01/25/2012, 2:35 PM    Allergies  Allergen Reactions  . Novocain Other (See Comments)    Unknown    Patient Measurements: Height: 5\' 5"  (165.1 cm) Weight: 134 lb 7.7 oz (61 kg) IBW/kg (Calculated) : 57   Vital Signs: Temp: 98.4 F (36.9 C) (03/24 1129) Temp src: Oral (03/24 1129) BP: 145/65 mmHg (03/24 1200) Pulse Rate: 109  (03/24 0800)  Labs:  Basename 01/25/12 1031 01/24/12 0620 01/24/12 0200 01/23/12 1758 01/23/12 1131  HGB 12.4 10.5* -- -- --  HCT 39.1 34.1* -- -- 35.1*  PLT 395 349 -- -- 351  APTT -- -- -- -- --  LABPROT 24.8* 29.5* -- -- 27.0*  INR 2.20* 2.75* -- -- 2.45*  HEPARINUNFRC -- -- -- -- --  CREATININE 3.85* 2.19* -- -- 3.68*  CKTOTAL -- -- 22 18 21   CKMB -- -- 0.9 0.8 1.0  TROPONINI -- -- <0.30 <0.30 <0.30   Estimated Creatinine Clearance: 12.6 ml/min (by C-G formula based on Cr of 3.85).

## 2012-01-26 ENCOUNTER — Inpatient Hospital Stay (HOSPITAL_COMMUNITY): Payer: Medicare Other

## 2012-01-26 LAB — URINE CULTURE: Culture  Setup Time: 201303221138

## 2012-01-26 LAB — BASIC METABOLIC PANEL
CO2: 24 mEq/L (ref 19–32)
Chloride: 92 mEq/L — ABNORMAL LOW (ref 96–112)
Creatinine, Ser: 4.36 mg/dL — ABNORMAL HIGH (ref 0.50–1.10)
Glucose, Bld: 164 mg/dL — ABNORMAL HIGH (ref 70–99)
Sodium: 130 mEq/L — ABNORMAL LOW (ref 135–145)

## 2012-01-26 LAB — GLUCOSE, CAPILLARY
Glucose-Capillary: 161 mg/dL — ABNORMAL HIGH (ref 70–99)
Glucose-Capillary: 174 mg/dL — ABNORMAL HIGH (ref 70–99)

## 2012-01-26 LAB — CBC
Hemoglobin: 10.6 g/dL — ABNORMAL LOW (ref 12.0–15.0)
MCV: 86.9 fL (ref 78.0–100.0)
Platelets: 384 10*3/uL (ref 150–400)
RBC: 4.06 MIL/uL (ref 3.87–5.11)
WBC: 9.7 10*3/uL (ref 4.0–10.5)

## 2012-01-26 MED ORDER — SODIUM CHLORIDE 0.9 % IV SOLN: 500.0000 mg | INTRAVENOUS | Status: DC

## 2012-01-26 MED ORDER — HYDRALAZINE HCL 50 MG PO TABS
50.0000 mg | ORAL_TABLET | Freq: Two times a day (BID) | ORAL | Status: DC
  Administered 2012-01-27 – 2012-01-28 (×2): 50 mg via ORAL
  Filled 2012-01-26 (×8): qty 1

## 2012-01-26 MED ORDER — PARICALCITOL 5 MCG/ML IV SOLN
INTRAVENOUS | Status: AC
  Filled 2012-01-26: qty 1

## 2012-01-26 MED ORDER — MEROPENEM 1 G IV SOLR
1.0000 g | INTRAVENOUS | Status: DC
  Filled 2012-01-26: qty 1

## 2012-01-26 MED ORDER — ISOSORBIDE MONONITRATE ER 60 MG PO TB24
60.0000 mg | ORAL_TABLET | Freq: Every day | ORAL | Status: DC
  Administered 2012-01-26 – 2012-01-29 (×4): 60 mg via ORAL
  Filled 2012-01-26 (×4): qty 1

## 2012-01-26 MED ORDER — INSULIN ASPART 100 UNIT/ML ~~LOC~~ SOLN
4.0000 [IU] | Freq: Three times a day (TID) | SUBCUTANEOUS | Status: DC
  Administered 2012-01-26 – 2012-01-29 (×7): 4 [IU] via SUBCUTANEOUS

## 2012-01-26 MED ORDER — MEROPENEM 500 MG IV SOLR
500.0000 mg | Freq: Every day | INTRAVENOUS | Status: DC
  Administered 2012-01-27: 500 mg via INTRAVENOUS
  Filled 2012-01-26 (×2): qty 0.5

## 2012-01-26 MED ORDER — WARFARIN SODIUM 2 MG PO TABS
2.0000 mg | ORAL_TABLET | Freq: Once | ORAL | Status: AC
  Administered 2012-01-26: 2 mg via ORAL
  Filled 2012-01-26 (×2): qty 1

## 2012-01-26 NOTE — Progress Notes (Signed)
ANTICOAGULATION CONSULT NOTE - Follow Up Consult  Pharmacy Consult for coumadin Indication: recent dvt/pe  Assessment 70YOF w/PMH recent VTE in 10/2011, presented to [facility] ER 3/22 w/Sx PNA and transferred to [facility] for acute illness and HD facilities. On coumadin (2mg /day PTA) and INR=2.44. Patient noted with hematuria and Hg=10.6 (down from 12.4).   Goal of Therapy INR 2-3  Plan 1. Coumadin 2mg /day  2. Will monitor hematuria and CBC closely. 3. Will f/u INR tomorrow.  Harland German, Pharm D 01/26/2012 11:03 AM     Allergies  Allergen Reactions  . Novocain Other (See Comments)    Unknown    Patient Measurements: Height: 5\' 5"  (165.1 cm) Weight: 134 lb 14.7 oz (61.2 kg) IBW/kg (Calculated) : 57   Vital Signs: Temp: 97.3 F (36.3 C) (03/25 0757) Temp src: Oral (03/25 0757) BP: 144/47 mmHg (03/25 0756) Pulse Rate: 90  (03/25 0756)  Labs:  Basename 01/26/12 0445 01/25/12 1031 01/24/12 0620 01/24/12 0200 01/23/12 1758 01/23/12 1131  HGB 10.6* 12.4 -- -- -- --  HCT 35.3* 39.1 34.1* -- -- --  PLT 384 395 349 -- -- --  APTT -- -- -- -- -- --  LABPROT 26.9* 24.8* 29.5* -- -- --  INR 2.44* 2.20* 2.75* -- -- --  HEPARINUNFRC -- -- -- -- -- --  CREATININE 4.36* 3.85* 2.19* -- -- --  CKTOTAL -- -- -- 22 18 21   CKMB -- -- -- 0.9 0.8 1.0  TROPONINI -- -- -- <0.30 <0.30 <0.30   Estimated Creatinine Clearance: 11.1 ml/min (by C-G formula based on Cr of 4.36).

## 2012-01-26 NOTE — Progress Notes (Addendum)
[facility]  TEAM 1 - Stepdown/ICU TEAM  Assumption of Care Note  Subjective: 71 year old female w/ PMH ESRD, COPD (2 liters at home), recent DVT/PE diagnosed in December 2012 (s/p ~2 months in rehab: recently d/c to home in end of Feb 2013). Presented to ER at [facility] on 3/22 w/ 2 day h/o increasing fatigue, 1 day h/o cough w/ purulent sputum accompanied by progressive dyspnea, low grade fever, chills and posterior CP w/ cough. In ER found hypoxic w/ dense RLL airspace opacification.  At the time of my exam she is sitting up at the bedside.  She denies sob, f/c, n/v, or abdom pain.  She states that she still feels very weak in general.  Lines / Drains:  Right tunneled HD cath (prior to admit)   Cultures:  BCX2 3/22>>> NGTD  Sputum 3/22>>> not obtained Urine 3/22 >>E coli ESBL  Antibiotics:  vanc (HCAP) 3/22>>> 3/24 Ceftazidime (HCAP) 3/22>>>3/25 azithro (CAP) 3/22>>> 3/23 merrem (Pyelo) 3/25  Objective: Weight change: 0.2 kg (7.1 oz)  Intake/Output Summary (Last 24 hours) at 01/26/12 1335 Last data filed at 01/26/12 1251  Gross per 24 hour  Intake    910 ml  Output   1032 ml  Net   -122 ml   Blood pressure 144/47, pulse 90, temperature 97.8 F (36.6 C), temperature source Oral, resp. rate 21, height 5\' 5"  (1.651 m), weight 61.2 kg (134 lb 14.7 oz), SpO2 96.00%.  CBG (last 3)   Basename 01/26/12 1153 01/26/12 0827 01/25/12 2153  GLUCAP 174* 161* 137*   Physical Exam: General: No acute respiratory distress at rest  Lungs: bibasilar crackles, R > L - no wheeze - good air movement th/o otherwise Cardiovascular: Regular rate and rhythm without murmur gallop or rub  Abdomen: Nontender, nondistended, soft, bowel sounds positive, no rebound, no ascites, no appreciable mass - overweight Extremities: No significant cyanosis, clubbing, or edema bilateral lower extremities  Lab Results:  Basename 01/26/12 0445 01/25/12 1031 01/24/12 0620  NA 130* 131* 135    K 4.0 3.6 3.5  CL 92* 92* 96  CO2 24 22 28   GLUCOSE 164* 188* 127*  BUN 34* 27* 10  CREATININE 4.36* 3.85* 2.19*  CALCIUM 9.4 9.7 8.9  MG -- -- 1.8  PHOS -- -- 2.0*   Basename 01/26/12 0445 01/25/12 1031 01/24/12 0620  WBC 9.7 16.5* 15.6*  NEUTROABS -- -- --  HGB 10.6* 12.4 10.5*  HCT 35.3* 39.1 34.1*  MCV 86.9 86.3 87.7  PLT 384 395 349    Basename 01/24/12 0200 01/23/12 1758  CKTOTAL 22 18  CKMB 0.9 0.8  CKMBINDEX -- --  TROPONINI <0.30 <0.30   Micro Results: Recent Results (from the past 240 hour(s))  MRSA PCR SCREENING     Status: Normal   Collection Time   01/23/12 10:11 AM      Component Value Range Status Comment   MRSA by PCR NEGATIVE  NEGATIVE  Final   URINE CULTURE     Status: Normal   Collection Time   01/23/12 10:45 AM      Component Value Range Status Comment   Specimen Description URINE, RANDOM   Final    Special Requests NONE   Final    Culture  Setup Time [id number]   Final    Colony Count >=100,000 COLONIES/ML   Final    Culture     Final    Value: ESCHERICHIA COLI     Note: Confirmed Extended Spectrum Beta-Lactamase Producer (  ESBL)   Report Status 01/26/2012 FINAL   Final    Organism ID, Bacteria ESCHERICHIA COLI   Final   CULTURE, BLOOD (ROUTINE X 2)     Status: Normal (Preliminary result)   Collection Time   01/23/12 11:31 AM      Component Value Range Status Comment   Specimen Description BLOOD LEFT ANTECUBITAL   Final    Special Requests BOTTLES DRAWN AEROBIC ONLY 10CC   Final    Culture  Setup Time [id number]   Final    Culture     Final    Value:        BLOOD CULTURE RECEIVED NO GROWTH TO DATE CULTURE WILL BE HELD FOR 5 DAYS BEFORE ISSUING A FINAL NEGATIVE REPORT   Report Status PENDING   Incomplete   CULTURE, BLOOD (ROUTINE X 2)     Status: Normal (Preliminary result)   Collection Time   01/23/12 12:09 PM      Component Value Range Status Comment   Specimen Description BLOOD LEFT ANTECUBITAL   Final    Special Requests BOTTLES  DRAWN AEROBIC ONLY 3CC   Final    Culture  Setup Time [id number]   Final    Culture     Final    Value:        BLOOD CULTURE RECEIVED NO GROWTH TO DATE CULTURE WILL BE HELD FOR 5 DAYS BEFORE ISSUING A FINAL NEGATIVE REPORT   Report Status PENDING   Incomplete     Studies/Results: All recent x-ray/radiology reports have been reviewed in detail.   Medications: I have reviewed the patient's complete medication list.  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

## 2012-01-26 NOTE — Progress Notes (Signed)
Subjective:  No new complaints  Objective:    Vital signs in last 24 hours: Filed Vitals:   01/26/12 0557 01/26/12 0600 01/26/12 0756 01/26/12 0757  BP:   144/47   Pulse:  79 90   Temp:    97.3 F (36.3 C)  TempSrc:    Oral  Resp:  17 21   Height:      Weight: 61.2 kg (134 lb 14.7 oz)     SpO2:  95% 96%    Weight change: 0.2 kg (7.1 oz)  Intake/Output Summary (Last 24 hours) at 01/26/12 1043 Last data filed at 01/26/12 1000  Gross per 24 hour  Intake   1210 ml  Output    457 ml  Net    753 ml   Labs: Basic Metabolic Panel:  Lab 01/26/12 1610 01/25/12 1031 01/24/12 0620 01/23/12 1131  NA 130* 131* 135 131*  K 4.0 3.6 3.5 4.8  CL 92* 92* 96 90*  CO2 24 22 28 28   GLUCOSE 164* 188* 127* 262*  BUN 34* 27* 10 31*  CREATININE 4.36* 3.85* 2.19* 3.68*  ALB -- -- -- --  CALCIUM 9.4 9.7 8.9 9.8  PHOS -- -- 2.0* 3.7   Liver Function Tests:  Lab 01/23/12 1131  AST 11  ALT 7  ALKPHOS 101  BILITOT 0.4  PROT 7.3  ALBUMIN 2.7*   CBC:  Lab 01/26/12 0445 01/25/12 1031 01/24/12 0620 01/23/12 1131  WBC 9.7 16.5* 15.6* 17.7*  NEUTROABS -- -- -- --  HGB 10.6* 12.4 10.5* 10.9*  HCT 35.3* 39.1 34.1* 35.1*  MCV 86.9 86.3 87.7 86.5  PLT 384 395 349 351   Cardiac Enzymes:  Lab 01/24/12 0200 01/23/12 1758 01/23/12 1131  CKTOTAL 22 18 21   CKMB 0.9 0.8 1.0  CKMBINDEX -- -- --  TROPONINI <0.30 <0.30 <0.30   CBG:  Lab 01/26/12 0827 01/25/12 2153 01/25/12 1701 01/25/12 1128 01/25/12 0721  GLUCAP 161* 137* 208* 167* 113*      I  have reviewed scheduled and prn medications.  Physical Exam:  Blood pressure 144/47, pulse 90, temperature 97.3 F (36.3 C), temperature source Oral, resp. rate 21, height 5\' 5"  (1.651 m), weight 61.2 kg (134 lb 14.7 oz), SpO2 96.00%.   General: alert, up in chair , no distress Neck: Supple; no masses or thyromegaly. JVP not elevated  Lungs: [place] oxygen, decreased air movement througohut, occ rhonchi Heart: Regular rate and rhythm; no murmurs,  clicks, rubs, or gallops.  Abdomen: Soft, nontender and nondistended. No masses, hepatosplenomegaly or hernias noted. Normal bowel sounds, without guarding, and without rebound.  Msk: Symmetrical without gross deformities.  Extremities: Without clubbing or edema.  Neurologic: Alert and oriented x4; grossly normal neurologically.  Skin: Intact without significant lesions or rashes.  Psych: Alert and cooperative. Normal mood and affect.  Assessment/Plan:  1. ESRD-MWF dialysis [place], HD today. 2. ANEMIA-No Epo, Iron every week  3. MBD-Zemplar 1 mcg, Phos low > d/c phoslo for now (was on 3ac at home) 4. HTN- on 4 BP meds at home (BB, clonidine, hydralazine, amlodipine). Restarted norvasc and clonidine yesterday 5. ACCESS-AVF Right and Catheter may cannulate  6. Pneumonia/COPD- Vanc d/c'd, Elita Quick continued (UTI also) 7. E Coli UTI- sensitivities pending. +gross hematuria related to UTI/coumadin most likely. Would not stop coumadin unless bleeding is excessive.  8. Coumadin AC- INR > 2  [name]  MD [facility] [phone] pgr    [phone] cell 01/26/2012, 10:43 AM

## 2012-01-26 NOTE — Progress Notes (Signed)
ANTIBIOTIC CONSULT NOTE - FOLLOW UP  Pharmacy Consult for meropenem Indication: ESBL E.coli UTI  Allergies  Allergen Reactions  . Novocain Other (See Comments)    Unknown    Patient Measurements: Height: 5\' 5"  (165.1 cm) Weight: 134 lb 14.7 oz (61.2 kg) IBW/kg (Calculated) : 57    Vital Signs: Temp: 97.8 F (36.6 C) (03/25 1154) Temp src: Oral (03/25 1154) BP: 133/58 mmHg (03/25 1153) Pulse Rate: 89  (03/25 1153) Intake/Output from previous day: 03/24 0701 - 03/25 0700 In: 1170 [P.O.:1170] Out: 425 [Urine:425] Intake/Output from this shift: Total I/O In: 480 [P.O.:480] Out: 182 [Urine:180; Stool:2]  Labs:  [facility] 01/26/12 0445 01/25/12 1031 01/24/12 0620  WBC 9.7 16.5* 15.6*  HGB 10.6* 12.4 10.5*  PLT 384 395 349  LABCREA -- -- --  CREATININE 4.36* 3.85* 2.19*   Estimated Creatinine Clearance: 11.1 ml/min (by C-G formula based on Cr of 4.36). No results found for this basename: VANCOTROUGH:2,VANCOPEAK:2,VANCORANDOM:2,GENTTROUGH:2,GENTPEAK:2,GENTRANDOM:2,TOBRATROUGH:2,TOBRAPEAK:2,TOBRARND:2,AMIKACINPEAK:2,AMIKACINTROU:2,AMIKACIN:2, in the last 72 hours   Assessment: 71 yo female with ESBL E. Coli UTI to change antibiotics to meropenem. Patient noted with ESRD on HD MWF   Plan:  -Will give meropenem 1gm now then 500mg  IV post HD  Benny Lennert 01/26/2012,3:46 PM

## 2012-01-26 NOTE — Progress Notes (Signed)
   CARE MANAGEMENT NOTE 01/26/2012  Patient:  [name] [name],[name] [name]   Account Number:  [id number]  Date Initiated:  01/26/2012  Documentation initiated by:  [name]  Subjective/Objective Assessment:   PT WAS ADMITTED WITH DYSPNEA AND OVERLOAD     Action/Plan:   PROGRESSION OF CARE AND DISCHARGE PLANNING   Anticipated DC Date:     Anticipated DC Plan:           Choice offered to / List presented to:             Status of service:  In process, will continue to follow Medicare Important Message given?   (If response is "NO", the following Medicare IM given date fields will be blank) Date Medicare IM given:   Date Additional Medicare IM given:    Discharge Disposition:    Per UR Regulation:  Reviewed for med. necessity/level of care/duration of stay  If discussed at Long Length of Stay Meetings, dates discussed:    Comments:  UR COMPLETED [name], RN, BSN [phone] 01/26/12

## 2012-01-27 LAB — GLUCOSE, CAPILLARY
Glucose-Capillary: 194 mg/dL — ABNORMAL HIGH (ref 70–99)
Glucose-Capillary: 146 mg/dL — ABNORMAL HIGH (ref 70–99)

## 2012-01-27 LAB — PROTIME-INR: Prothrombin Time: 25.7 seconds — ABNORMAL HIGH (ref 11.6–15.2)

## 2012-01-27 MED ORDER — WARFARIN SODIUM 2 MG PO TABS
2.0000 mg | ORAL_TABLET | Freq: Once | ORAL | Status: AC
Start: 2012-01-27 — End: 2012-01-27
  Administered 2012-01-27: 2 mg via ORAL
  Filled 2012-01-27: qty 1

## 2012-01-27 MED ORDER — HEPARIN SODIUM (PORCINE) 1000 UNIT/ML DIALYSIS
2500.0000 [IU] | INTRAMUSCULAR | Status: DC | PRN
  Filled 2012-01-27: qty 3

## 2012-01-27 MED ORDER — FOSFOMYCIN TROMETHAMINE 3 G PO PACK
3.0000 g | PACK | Freq: Once | ORAL | Status: AC
  Administered 2012-01-27: 3 g via ORAL
  Filled 2012-01-27: qty 3

## 2012-01-27 MED ORDER — SODIUM CHLORIDE 0.9 % IV SOLN
500.0000 mg | Freq: Every day | INTRAVENOUS | Status: AC
  Administered 2012-01-27: 500 mg via INTRAVENOUS
  Filled 2012-01-27: qty 0.5

## 2012-01-27 MED ORDER — SODIUM CHLORIDE 0.9 % IV SOLN
500.0000 mg | INTRAVENOUS | Status: DC
  Administered 2012-01-28: 500 mg via INTRAVENOUS
  Filled 2012-01-27 (×2): qty 0.5

## 2012-01-27 NOTE — Progress Notes (Signed)
Subjective:  No new complaints  Objective:    Vital signs in last 24 hours: Filed Vitals:   01/26/12 2235 01/27/12 0615 01/27/12 1013 01/27/12 1438  BP: 128/72 116/66 118/47 129/64  Pulse: 106 94 95 92  Temp: 97.8 F (36.6 C) 98.2 F (36.8 C) 98.2 F (36.8 C) 98.6 F (37 C)  TempSrc: Oral Oral Oral Oral  Resp: 18 19 18 18   Height:      Weight: 59.8 kg (131 lb 13.4 oz)     SpO2: 93% 91% 95% 93%   Weight change: 1.4 kg (3 lb 1.4 oz)  Intake/Output Summary (Last 24 hours) at 01/27/12 1752 Last data filed at 01/27/12 1440  Gross per 24 hour  Intake    720 ml  Output   2914 ml  Net  -2194 ml   Labs: Basic Metabolic Panel:  Lab 01/26/12 1610 01/25/12 1031 01/24/12 0620 01/23/12 1131  NA 130* 131* 135 131*  K 4.0 3.6 3.5 4.8  CL 92* 92* 96 90*  CO2 24 22 28 28   GLUCOSE 164* 188* 127* 262*  BUN 34* 27* 10 31*  CREATININE 4.36* 3.85* 2.19* 3.68*  ALB -- -- -- --  CALCIUM 9.4 9.7 8.9 9.8  PHOS -- -- 2.0* 3.7   Liver Function Tests:  Lab 01/23/12 1131  AST 11  ALT 7  ALKPHOS 101  BILITOT 0.4  PROT 7.3  ALBUMIN 2.7*   CBC:  Lab 01/26/12 0445 01/25/12 1031 01/24/12 0620 01/23/12 1131  WBC 9.7 16.5* 15.6* 17.7*  NEUTROABS -- -- -- --  HGB 10.6* 12.4 10.5* 10.9*  HCT 35.3* 39.1 34.1* 35.1*  MCV 86.9 86.3 87.7 86.5  PLT 384 395 349 351   Cardiac Enzymes:  Lab 01/24/12 0200 01/23/12 1758 01/23/12 1131  CKTOTAL 22 18 21   CKMB 0.9 0.8 1.0  CKMBINDEX -- -- --  TROPONINI <0.30 <0.30 <0.30   CBG:  Lab 01/27/12 1653 01/27/12 1134 01/27/12 0749 01/26/12 2219 01/26/12 1632  GLUCAP 333* 194* 171* 101* 182*      I  have reviewed scheduled and prn medications.  Physical Exam:  Blood pressure 129/64, pulse 92, temperature 98.6 F (37 C), temperature source Oral, resp. rate 18, height 5\' 5"  (1.651 m), weight 59.8 kg (131 lb 13.4 oz), SpO2 93.00%.   General: alert, up in chair , no distress Neck: Supple; no masses or thyromegaly. JVP not elevated  Lungs: [place]  oxygen, decreased air movement througohut, occ rhonchi Heart: Regular rate and rhythm; no murmurs, clicks, rubs, or gallops.  Abdomen: Soft, nontender and nondistended. No masses, hepatosplenomegaly or hernias noted. Normal bowel sounds, without guarding, and without rebound.  Msk: Symmetrical without gross deformities.  Extremities: Without clubbing or edema.  Neurologic: Alert and oriented x4; grossly normal neurologically.  Skin: Intact without significant lesions or rashes.  Psych: Alert and cooperative. Normal mood and affect.  Outpatient HD Orders: [place] MWF 3.5 hrs, 62kg EDW, Heparin 5000, 2400 then 2200  Zemplar 1.0/HD Venofer 50mg /wk. No EPO. IJ HD catheter.  Assessment/Plan:  1. ESRD-MWF dialysis , HD tomorrow. Below EDW 2. ANEMIA-No Epo, Iron every week. Hb 10-11 range.  3. MBD-Zemplar 1 mcg, Phos low > d/c'd phoslo for now (was on 3ac at home) 4. HTN- on 4 BP meds at home- BB, clonidine, hydralazine, amlodipine. Not on BB here. Will d/c hydralazine due to soft BP's. Continue Norvasc and clonidine for now. Weight is down 2-3 kg below EDW. 5. ACCESS-AVF Right and Catheter may cannulate  6.  Pneumonia/COPD- Vanc d/c'd, Elita Quick continued (UTI also) 7. E Coli UTI- ESBL- see Dr. Pandora Leiter note from today. Fosfomycin started, getting IV Merrem also until d/c. 8. Coumadin AC- INR > 2  [name] Moselle  MD [facility] [phone] pgr    [phone] cell 01/27/2012, 5:52 PM

## 2012-01-27 NOTE — Progress Notes (Signed)
[facility] [place] TEAM 58  71 year old female w/ PMH ESRD, COPD (2 liters at home), recent DVT/PE diagnosed in December 2012 (s/p ~2 months in rehab: recently d/c to home in end of Feb 2013). Presented to ER at [facility] on 3/22 w/ 2 day h/o increasing fatigue, 1 day h/o cough w/ purulent sputum accompanied by progressive dyspnea, low grade fever, chills and posterior CP w/ cough. In ER found hypoxic w/ dense RLL airspace opacification.   Subjective: Feels okay. No specific complaint today but does state that she uses oxygen at home at baseline [2 L]. No fever or chills. Tolerating diet fairly well. Son at bedside currently  Lines / Drains:  Right tunneled HD cath (prior to admit)   Cultures:  BCX2 3/22>>> NGTD  Sputum 3/22>>> not obtained Urine 3/22 >>E coli ESBL  Antibiotics:  vanc (HCAP) 3/22>>> 3/24 Ceftazidime (HCAP) 3/22>>>3/25 azithro (CAP) 3/22>>> 3/23 merrem (Pyelo) 3/25  Objective: Weight change: 1.4 kg (3 lb 1.4 oz)  Intake/Output Summary (Last 24 hours) at 01/27/12 1300 Last data filed at 01/27/12 1013  Gross per 24 hour  Intake    480 ml  Output   2963 ml  Net  -2483 ml   Blood pressure 118/47, pulse 95, temperature 98.2 F (36.8 C), temperature source Oral, resp. rate 18, height 5\' 5"  (1.651 m), weight 59.8 kg (131 lb 13.4 oz), SpO2 95.00%.  CBG (last 3)   Basename 01/27/12 1134 01/27/12 0749 01/26/12 2219  GLUCAP 194* 171* 101*   Physical Exam: General: No acute respiratory distress at rest  Lungs: No added sound, no TVR no TVF Cardiovascular: Regular rate and rhythm without murmur gallop or rub  Abdomen: Nontender, nondistended, soft, bowel sounds positive, no rebound, no ascites, no appreciable mass - overweight Extremities: No significant cyanosis, clubbing, or edema bilateral lower extremities  Lab Results:  Basename 01/26/12 0445 01/25/12 1031  NA 130* 131*  K 4.0 3.6  CL 92* 92*  CO2 24 22  GLUCOSE 164* 188*  BUN 34* 27*    CREATININE 4.36* 3.85*  CALCIUM 9.4 9.7  MG -- --  PHOS -- --    Basename 01/26/12 0445 01/25/12 1031  WBC 9.7 16.5*  NEUTROABS -- --  HGB 10.6* 12.4  HCT 35.3* 39.1  MCV 86.9 86.3  PLT 384 395   No results found for this basename: CKTOTAL:3,CKMB:3,CKMBINDEX:3,TROPONINI:3 in the last 72 hours Micro Results: Recent Results (from the past 240 hour(s))  MRSA PCR SCREENING     Status: Normal   Collection Time   01/23/12 10:11 AM      Component Value Range Status Comment   MRSA by PCR NEGATIVE  NEGATIVE  Final   URINE CULTURE     Status: Normal   Collection Time   01/23/12 10:45 AM      Component Value Range Status Comment   Specimen Description URINE, RANDOM   Final    Special Requests NONE   Final    Culture  Setup Time [id number]   Final    Colony Count >=100,000 COLONIES/ML   Final    Culture     Final    Value: ESCHERICHIA COLI     Note: Confirmed Extended Spectrum Beta-Lactamase Producer (ESBL)   Report Status 01/26/2012 FINAL   Final    Organism ID, Bacteria ESCHERICHIA COLI   Final   CULTURE, BLOOD (ROUTINE X 2)     Status: Normal (Preliminary result)   Collection Time   01/23/12 11:31 AM  Component Value Range Status Comment   Specimen Description BLOOD LEFT ANTECUBITAL   Final    Special Requests BOTTLES DRAWN AEROBIC ONLY 10CC   Final    Culture  Setup Time [id number]   Final    Culture     Final    Value:        BLOOD CULTURE RECEIVED NO GROWTH TO DATE CULTURE WILL BE HELD FOR 5 DAYS BEFORE ISSUING A FINAL NEGATIVE REPORT   Report Status PENDING   Incomplete   CULTURE, BLOOD (ROUTINE X 2)     Status: Normal (Preliminary result)   Collection Time   01/23/12 12:09 PM      Component Value Range Status Comment   Specimen Description BLOOD LEFT ANTECUBITAL   Final    Special Requests BOTTLES DRAWN AEROBIC ONLY 3CC   Final    Culture  Setup Time [id number]   Final    Culture     Final    Value:        BLOOD CULTURE RECEIVED NO GROWTH TO DATE CULTURE WILL  BE HELD FOR 5 DAYS BEFORE ISSUING A FINAL NEGATIVE REPORT   Report Status PENDING   Incomplete     Studies/Results: All recent x-ray/radiology reports have been reviewed in detail.   Medications: I have reviewed the patient's complete medication list.  Assessment/Plan:  Acute-on-chronic respiratory failure in setting of Presumed HCAP  RML and RLL - PCCM had narrowed abx to fortaz alone - now changing to merrem due to urine cx    E coli UTI - ESBL cx confirms ESBL - d/c fortaz in favor of merrem -  - will likely need PICC line to complete 14 day course of therapy- will ask nephrologist if can place a PICC-Sensitive to Gentamicin/Macrobid/Zosyn-note that this is ESBL in a prior person with ESBL, and patient presented with overt Sepsis.  spoke with Dr. Arta Silence who favors Gentamicin as can be given with dialysis, he is okay with Meropenem here but would prefer currently not to place PICC line Spoke to Dr. Drue Second states in the present scenario with the patient was not overtly symptomatic, could consider fosfomycin orally which we will change patient to  History of DVT and PE Remains on anticoag-INR therapeutic   ESRD on dialysis: M/W/F Nephrology following-appreciate input  Diabetes mellitus Mod controlled, CBG 171-194 adjust-continue SSI Coverage, mealtime insulin 4 u, hs 5 units novolog and Lantus 18 u  Hypothyroidism On synthroid 25 mcg daily  HTN Reasonable control at present-had slightly lower blood pressures yesterday.  Will monitor.    Anemia due to chronic disease  Hematuria Felt to be due to cystitis + coumadin - follow - no indication to d/c coumadin at this time  Dispo Stable for transfer to [facility]00  Get Pt/Ot to see Son at bedside and explained course of care to him  Pleas Koch, MD [facility] (P) [phone]

## 2012-01-27 NOTE — Progress Notes (Signed)
   CARE MANAGEMENT NOTE 01/27/2012  Patient:  [name] [name],[name] [name]   Account Number:  [id number]  Date Initiated:  01/26/2012  Documentation initiated by:  [name]  Subjective/Objective Assessment:   PT WAS ADMITTED WITH DYSPNEA AND OVERLOAD     Action/Plan:   PROGRESSION OF CARE AND DISCHARGE PLANNING   Anticipated DC Date:  01/30/2012   Anticipated DC Plan:  HOME W HOME HEALTH SERVICES      DC Planning Services  CM consult      Choice offered to / List presented to:             Status of service:  In process, will continue to follow Medicare Important Message given?   (If response is "NO", the following Medicare IM given date fields will be blank) Date Medicare IM given:   Date Additional Medicare IM given:    Discharge Disposition:    Per UR Regulation:  Reviewed for med. necessity/level of care/duration of stay  If discussed at Long Length of Stay Meetings, dates discussed:    Comments:  01/27/2012 1500 Darlyne Russian RN, [place] [phone] Met with patient to discuss discharge planning and home care services. She reports having a nurse 2x/wk from [facility] and the PT was going to end after a few more visits.   UR COMPLETED [name], RN, BSN [phone] 01/26/12

## 2012-01-27 NOTE — Evaluation (Signed)
Occupational Therapy Evaluation Patient Details Name: [name] [name] MRN: [id number] DOB: 03-Sep-1941 Today's Date: 01/27/2012  Problem List:  Patient Active Problem List  Diagnoses  . Diabetes mellitus  . Peripheral arterial disease  . Hypercholesterolemia  . Hypothyroidism  . Mycotic toenails  . ARF (acute respiratory failure)  . Pleural effusion, bilateral  . Hyperkalemia  . AKI (acute kidney injury)  . CKD (chronic kidney disease) stage 5, GFR less than 15 ml/min  . DM (diabetes mellitus)  . COPD with acute exacerbation  . HTN (hypertension)  . Anemia  . Cardiac enzymes elevated  . UTI (lower urinary tract infection)  . Acute-on-chronic respiratory failure  . HCAP (healthcare-associated pneumonia)  . ESRD (end stage renal disease) on dialysis  . History of DVT (deep vein thrombosis)  . History of pulmonary embolus (PE)    Past Medical History:  Past Medical History  Diagnosis Date  . Diabetes mellitus   . Hypercholesterolemia   . Hypothyroidism   . Colitis   . Chronic kidney disease   . Coronary artery disease   . Hypertension   . GERD (gastroesophageal reflux disease)   . Myocardial infarction   . Peripheral vascular disease   . COPD (chronic obstructive pulmonary disease)   . Pneumonia    Past Surgical History:  Past Surgical History  Procedure Date  . Partial hysterectomy   . Tubal ligation   . Amputaton of right fifth toe   . Tonsillectomy   . Insertion of dialysis catheter 10/23/2011    Procedure: INSERTION OF DIALYSIS CATHETER;  Surgeon: Chuck Hint, MD;  Location[facility] OR;  Service: Vascular;  Laterality: Right;  Right internal jugular Insertion diatek catheter    OT Assessment/Plan/Recommendation OT Assessment Clinical Impression Statement: Pt admitted for UTI and possible PNA. Presents with slightly lowered activity tolerance but is otherwise at baseline functioning. Pt with necessary level of assist from son upon d/c. Encouraged pt to get OOB  frequently as well as perform ADL with nsg. No further acute OT indicated at this time. Signing off OT Recommendation/Assessment: Patient does not need any further OT services OT Recommendation Follow Up Recommendations: No OT follow up Equipment Recommended: None recommended by OT  OT Evaluation Precautions/Restrictions  Precautions Precautions: Fall Restrictions Weight Bearing Restrictions: No Prior Functioning Home Living Lives With: Son Type of Home: House Home Layout: One level Home Access: Ramped entrance Bathroom Shower/Tub:  (takes sink bath) Bathroom Toilet: Standard Home Adaptive Equipment: Bedside commode/3-in-1 Prior Function Level of Independence: Independent with basic ADLs;Independent with gait Able to Take Stairs?: Reciprically Driving: No Vocation: Retired ADL ADL Eating/Feeding: Independent;Simulated Where Assessed - Eating/Feeding: Edge of bed Grooming: Performed;Independent Where Assessed - Grooming: Standing at sink Upper Body Bathing: Simulated;Supervision/safety Where Assessed - Upper Body Bathing: Sit to stand from bed Lower Body Bathing: Simulated;Supervision/safety Where Assessed - Lower Body Bathing: Sit to stand from bed Upper Body Dressing: Simulated;Independent Where Assessed - Upper Body Dressing: Sitting, bed Lower Body Dressing: Simulated;Supervision/safety Where Assessed - Lower Body Dressing: Sit to stand from bed Toilet Transfer: Simulated;Supervision/safety Toilet Transfer Details (indicate cue type and reason): sit to stand from [employer] Transfer Equipment: Bedside commode Toileting - Clothing Manipulation: Performed;Independent Where Assessed - Toileting Clothing Manipulation: Standing Toileting - Hygiene: Simulated;Independent Where Assessed - Toileting Hygiene: Sit on 3-in-1 or toilet Tub/Shower Transfer: Not assessed ((Not applicable)) Ambulation Related to ADLs: Supervision with ambulation to 3n1 and back to bed. Pt able to  do so without grabbing hold of furniture ADL Comments: Pt  at/near baseline functioning Cognition Cognition Orientation Level: Oriented X4 Sensation/Coordination Sensation Light Touch: Appears Intact Coordination Gross Motor Movements are Fluid and Coordinated: Yes Fine Motor Movements are Fluid and Coordinated: Yes Extremity Assessment RUE Assessment RUE Assessment: Within Functional Limits LUE Assessment LUE Assessment: Within Functional Limits Mobility  Bed Mobility Supine to Sit: 6: Modified independent (Device/Increase time);With rails Sit to Supine: 6: Modified independent (Device/Increase time);With rail End of Session OT - End of Session Equipment Utilized During Treatment: Gait belt Activity Tolerance: Patient tolerated treatment well Patient left: in bed;with call bell in reach General Behavior During Session: [facility] for tasks performed Cognition: [facility] for tasks performed   Solange Emry 01/27/2012, 3:25 PM

## 2012-01-27 NOTE — Progress Notes (Signed)
ANTICOAGULATION & ANTIBIOTIC CONSULT NOTE - Follow Up Consult  Pharmacy Consult for Warfarin & Meropenem Indication: Hx recent DVT/PE and ESBL E.Coli UTI & HCAP Coverage  Allergies  Allergen Reactions  . Novocain Other (See Comments)    Unknown    Patient Measurements: Height: 5\' 5"  (165.1 cm) Weight: 131 lb 13.4 oz (59.8 kg) IBW/kg (Calculated) : 57   Vital Signs: Temp: 98.2 F (36.8 C) (03/26 0615) Temp src: Oral (03/26 0615) BP: 116/66 mmHg (03/26 0615) Pulse Rate: 94  (03/26 0615)  Labs:  Basename 01/27/12 0623 01/26/12 0445 01/25/12 1031  HGB -- 10.6* 12.4  HCT -- 35.3* 39.1  PLT -- 384 395  APTT -- -- --  LABPROT 25.7* 26.9* 24.8*  INR 2.30* 2.44* 2.20*  HEPARINUNFRC -- -- --  CREATININE -- 4.36* 3.85*  CKTOTAL -- -- --  CKMB -- -- --  TROPONINI -- -- --   Estimated Creatinine Clearance: 11.1 ml/min (by C-G formula based on Cr of 4.36).   Medications:  Anti-infectives     Start     Dose/Rate Route Frequency Ordered Stop   01/28/12 1200   meropenem (MERREM) 500 mg in sodium chloride 0.9 % 50 mL IVPB  Status:  Discontinued        500 mg 100 mL/hr over 30 Minutes Intravenous Once per day on Mon Wed Fri 01/26/12 1554 01/27/12 0854   01/26/12 1800   meropenem (MERREM) 1 g in sodium chloride 0.9 % 100 mL IVPB  Status:  Discontinued        1 g 200 mL/hr over 30 Minutes Intravenous To Hemodialysis 01/26/12 1554 01/27/12 0854   01/26/12 1600   meropenem (MERREM) 500 mg in sodium chloride 0.9 % 50 mL IVPB        500 mg 100 mL/hr over 30 Minutes Intravenous Daily 01/26/12 1438     01/26/12 1200   vancomycin (VANCOCIN) 750 mg in sodium chloride 0.9 % 150 mL IVPB  Status:  Discontinued        750 mg 150 mL/hr over 60 Minutes Intravenous Every M-W-F (Hemodialysis) 01/23/12 1358 01/25/12 1201   01/24/12 1800   cefTAZidime (FORTAZ) 1 g in dextrose 5 % 50 mL IVPB  Status:  Discontinued        1 g 100 mL/hr over 30 Minutes Intravenous Every 24 hours 01/23/12 1119  01/23/12 1354   01/23/12 2200   cefTAZidime (FORTAZ) 1 g in dextrose 5 % 50 mL IVPB  Status:  Discontinued        1 g 100 mL/hr over 30 Minutes Intravenous Every 24 hours 01/23/12 1354 01/26/12 1438   01/23/12 1230   cefTAZidime (FORTAZ) 1 g in dextrose 5 % 50 mL IVPB        1 g 100 mL/hr over 30 Minutes Intravenous  Once 01/23/12 1114 01/23/12 1424   01/23/12 1130   vancomycin (VANCOCIN) 1,250 mg in sodium chloride 0.9 % 250 mL IVPB        1,250 mg 166.7 mL/hr over 90 Minutes Intravenous  Once 01/23/12 1112 01/23/12 2049   01/23/12 1100   azithromycin (ZITHROMAX) 500 mg in dextrose 5 % 250 mL IVPB  Status:  Discontinued        500 mg 250 mL/hr over 60 Minutes Intravenous Every 24 hours 01/23/12 1029 01/24/12 0949          Assessment: 71 y.o. F on warfarin for recent history of DVT/PE in December 2012 with a therapeutic INR this a.m. (INR 2.3,  goal of 2-3). No CBC this a.m, gross hematuria has been noted by both the nephrology and internal medicine teams however both state that there is no indication to hold warfarin at this time. Will continue to monitor for drops in Hgb with next CBC. PTA dose was known to be 2 mg daily  The patient was transitioned from [place] for empiric HCAP/UTI coverage to [facility] on 3/25 when urine cultures resulted as ESBL producing E. coli. Currently no doses of Merrem have been charted as given thus far. The patient has ESRD and receives HD on MWF -- will give Merrem this morning and then adjust regimen to be given daily in the evening (to be after HD on HD days). If the patient is asymptomatic for s/sx of UTI --consider only treating for duration of HCAP coverage as patient's with ESRD are usually chronically colonized with some form of bacteria in their urinary tract. Today is D#6 of empiric HCAP coverage.   Goal of Therapy:  INR 2-3   Plan:  1. Warfarin 2 mg x 1 dose at 1800 today 2. Adjust Merrem to 500 mg every 24 hours (to be given after HD on HD  days) 3. Consider addressing antibiotic LOT for antibiotics 4. Will continue to monitor for any signs/symptoms of bleeding and will follow up with PT/INR in the a.m 5. Will continue to follow HD schedule/duration, culture results, LOT, and antibiotic de-escalation plans   Georgina Pillion, PharmD, BCPS Clinical Pharmacist Pager: [phone] 01/27/2012 9:23 AM

## 2012-01-28 ENCOUNTER — Inpatient Hospital Stay (HOSPITAL_COMMUNITY): Payer: Medicare Other

## 2012-01-28 LAB — GLUCOSE, CAPILLARY
Glucose-Capillary: 207 mg/dL — ABNORMAL HIGH (ref 70–99)
Glucose-Capillary: 189 mg/dL — ABNORMAL HIGH (ref 70–99)

## 2012-01-28 LAB — PROTIME-INR: Prothrombin Time: 26.6 seconds — ABNORMAL HIGH (ref 11.6–15.2)

## 2012-01-28 MED ORDER — WARFARIN SODIUM 2 MG PO TABS
2.0000 mg | ORAL_TABLET | Freq: Once | ORAL | Status: AC
  Administered 2012-01-28: 2 mg via ORAL
  Filled 2012-01-28 (×2): qty 1

## 2012-01-28 MED ORDER — DARBEPOETIN ALFA-POLYSORBATE 25 MCG/0.42ML IJ SOLN: 25.0000 ug | INTRAMUSCULAR | Status: DC

## 2012-01-28 MED ORDER — PARICALCITOL 5 MCG/ML IV SOLN
INTRAVENOUS | Status: AC
  Administered 2012-01-28: 1 ug via INTRAVENOUS
  Filled 2012-01-28: qty 1

## 2012-01-28 NOTE — Progress Notes (Signed)
Subjective:   No cos Objective Vital signs in last 24 hours: Filed Vitals:   01/27/12 1816 01/27/12 2227 01/28/12 0539 01/28/12 1000  BP: 118/63 98/52 108/56 125/78  Pulse: 89 78 84 89  Temp: 97.7 F (36.5 C) 97.4 F (36.3 C) 97 F (36.1 C) 98.1 F (36.7 C)  TempSrc: Oral Oral Oral Oral  Resp: 18 17 18 18   Height:      Weight:      SpO2: 94% 93% 93% 94%   Weight change:   Intake/Output Summary (Last 24 hours) at 01/28/12 1143 Last data filed at 01/28/12 0900  Gross per 24 hour  Intake   1138 ml  Output     46 ml  Net   1092 ml   Labs: Basic Metabolic Panel:  Lab 01/26/12 4098 01/25/12 1031 01/24/12 0620 01/23/12 1131  NA 130* 131* 135 --  K 4.0 3.6 3.5 --  CL 92* 92* 96 --  CO2 24 22 28  --  GLUCOSE 164* 188* 127* --  BUN 34* 27* 10 --  CREATININE 4.36* 3.85* 2.19* --  CALCIUM 9.4 9.7 8.9 --  ALB -- -- -- --  PHOS -- -- 2.0* 3.7   Liver Function Tests:  Lab 01/23/12 1131  AST 11  ALT 7  ALKPHOS 101  BILITOT 0.4  PROT 7.3  ALBUMIN 2.7*   No results found for this basename: LIPASE:3,AMYLASE:3 in the last 168 hours No results found for this basename: AMMONIA:3 in the last 168 hours CBC:  Lab 01/26/12 0445 01/25/12 1031 01/24/12 0620 01/23/12 1131  WBC 9.7 16.5* 15.6* --  NEUTROABS -- -- -- --  HGB 10.6* 12.4 10.5* --  HCT 35.3* 39.1 34.1* --  MCV 86.9 86.3 87.7 86.5  PLT 384 395 349 --   Cardiac Enzymes:  Lab 01/24/12 0200 01/23/12 1758 01/23/12 1131  CKTOTAL 22 18 21   CKMB 0.9 0.8 1.0  CKMBINDEX -- -- --  TROPONINI <0.30 <0.30 <0.30   CBG:  Lab 01/28/12 0748 01/27/12 2222 01/27/12 1653 01/27/12 1134 01/27/12 0749  GLUCAP 137* 146* 333* 194* 171*    Iron Studies: No results found for this basename: IRON,TIBC,TRANSFERRIN,FERRITIN in the last 72 hours Studies/Results: No results found. Medications:      . amLODipine  10 mg Oral Daily  . antiseptic oral rinse  15 mL Mouth Rinse q12n4p  . aspirin  81 mg Oral Daily  . cloNIDine  0.2 mg  Oral TID  . fosfomycin  3 g Oral Once  . hydrALAZINE  50 mg Oral BID  . insulin aspart  0-15 Units Subcutaneous TID WC  . insulin aspart  0-5 Units Subcutaneous QHS  . insulin aspart  4 Units Subcutaneous TID WC  . insulin glargine  18 Units Subcutaneous QHS  . isosorbide mononitrate  60 mg Oral Daily  . levothyroxine  25 mcg Oral Daily  . meropenem (MERREM) IV  500 mg Intravenous Q24H  . pantoprazole  40 mg Oral Daily  . paricalcitol  1 mcg Intravenous Q M,W,F-HD  . warfarin  2 mg Oral ONCE-1800  . warfarin  2 mg Oral ONCE-1800  . Warfarin - Pharmacist Dosing Inpatient   Does not apply q1800   I  have reviewed scheduled and prn medications.  Physical Exam: General: Alert, NAD, Sitting in chair Heart: RRR, no rub or murmur Lungs: Bilat basilar sounds decreased, left faint rhonchi Abdomen:soft, nontender Extremities: Dialysis Access: no pedal edema, positve bruit  Right upper arm avgg /right ij permcath  Problem/Plan: 1. ESRD-MWF dialysis [place], HD today follow up wts and bps, below edw 2. ANEMIA-No Epo, Iron every week. Hbg 10.6 start low dose ESA with mild drop 3. MBD-Zemplar 1 mcg, Phos low > d/c'd phoslo for now (was on 3ac at home)phos 2.0 on 3/23 fu with labs pre hd today ca was 9.4 on 3/25 4. HTN- on 4 BP meds at home- BB, clonidine, hydralazine, amlodipine. Not on BB here. Will d/c hydralazine due to soft BP's. Continue Norvasc and clonidine for now. Weight is down 2-3 kg below EDW. 125/78 and 108/56 bps this am 5. ACCESS-AVF Right and Catheter may cannulate  6. Pneumonia/COPD- Vanc d/c'd, Elita Quick continued (UTI also) with Merrem dosing as below 7. E Coli UTI- ESBL- see Dr. Pandora Leiter note from today. Fosfomycin started, getting IV Merrem also until d/c. 8. Coumadin AC- INR = 2.41 today for recent DVT/PE 9. Dispo- possible d/c tomorrow according to pt   Lenny Pastel, PA-C [facility] [phone] 01/28/2012,11:43 AM  LOS: 5 days   Patient seen and  examined and agree with assessment and plan as above.  [name]  MD [facility] [phone] pgr    [phone] cell 01/28/2012, 3:56 PM

## 2012-01-28 NOTE — Progress Notes (Signed)
Patient ID: [name] [name]  female  [id number]    DOB: 1941/02/27    DOA: 01/23/2012  PCP: Quentin Mulling, MD, MD  Subjective: Feeling a lot better today, awaiting hemodialysis today  Objective: Weight change:   Intake/Output Summary (Last 24 hours) at 01/28/12 1330 Last data filed at 01/28/12 0900  Gross per 24 hour  Intake   1138 ml  Output     46 ml  Net   1092 ml   Blood pressure 125/78, pulse 89, temperature 98.1 F (36.7 C), temperature source Oral, resp. rate 18, height 5\' 5"  (1.651 m), weight 59.8 kg (131 lb 13.4 oz), SpO2 94.00%.  Physical Exam: General: Alert and awake, oriented x3, not in any acute distress. HEENT: anicteric sclera, pupils reactive to light and accommodation, EOMI CVS: S1-S2 clear, no murmur rubs or gallops Chest: clear to auscultation bilaterally, no wheezing, rales or rhonchi Abdomen: soft nontender, nondistended, normal bowel sounds, no organomegaly Extremities: no cyanosis, clubbing or edema noted bilaterally Neuro: Cranial nerves II-XII intact, no focal neurological deficits  Lab Results: Basic Metabolic Panel:  Lab 01/26/12 6213 01/25/12 1031 01/24/12 0620  NA 130* 131* --  K 4.0 3.6 --  CL 92* 92* --  CO2 24 22 --  GLUCOSE 164* 188* --  BUN 34* 27* --  CREATININE 4.36* 3.85* --  CALCIUM 9.4 9.7 --  MG -- -- 1.8  PHOS -- -- 2.0*   Liver Function Tests:  Lab 01/23/12 1131  AST 11  ALT 7  ALKPHOS 101  BILITOT 0.4  PROT 7.3  ALBUMIN 2.7*   No results found for this basename: LIPASE:2,AMYLASE:2 in the last 168 hours No results found for this basename: AMMONIA:2 in the last 168 hours CBC:  Lab 01/26/12 0445 01/25/12 1031  WBC 9.7 16.5*  NEUTROABS -- --  HGB 10.6* 12.4  HCT 35.3* 39.1  MCV 86.9 86.3  PLT 384 395   Cardiac Enzymes:  Lab 01/24/12 0200 01/23/12 1758 01/23/12 1131  CKTOTAL 22 18 21   CKMB 0.9 0.8 1.0  CKMBINDEX -- -- --  TROPONINI <0.30 <0.30 <0.30   BNP: No components found with this basename:  POCBNP:2 CBG:  Lab 01/28/12 1131 01/28/12 0748 01/27/12 2222 01/27/12 1653 01/27/12 1134  GLUCAP 207* 137* 146* 333* 194*     Micro Results: Recent Results (from the past 240 hour(s))  MRSA PCR SCREENING     Status: Normal   Collection Time   01/23/12 10:11 AM      Component Value Range Status Comment   MRSA by PCR NEGATIVE  NEGATIVE  Final   URINE CULTURE     Status: Normal   Collection Time   01/23/12 10:45 AM      Component Value Range Status Comment   Specimen Description URINE, RANDOM   Final    Special Requests NONE   Final    Culture  Setup Time [id number]   Final    Colony Count >=100,000 COLONIES/ML   Final    Culture     Final    Value: ESCHERICHIA COLI     Note: Confirmed Extended Spectrum Beta-Lactamase Producer (ESBL)   Report Status 01/26/2012 FINAL   Final    Organism ID, Bacteria ESCHERICHIA COLI   Final   CULTURE, BLOOD (ROUTINE X 2)     Status: Normal (Preliminary result)   Collection Time   01/23/12 11:31 AM      Component Value Range Status Comment   Specimen Description BLOOD LEFT ANTECUBITAL   Final  Special Requests BOTTLES DRAWN AEROBIC ONLY 10CC   Final    Culture  Setup Time [id number]   Final    Culture     Final    Value:        BLOOD CULTURE RECEIVED NO GROWTH TO DATE CULTURE WILL BE HELD FOR 5 DAYS BEFORE ISSUING A FINAL NEGATIVE REPORT   Report Status PENDING   Incomplete   CULTURE, BLOOD (ROUTINE X 2)     Status: Normal (Preliminary result)   Collection Time   01/23/12 12:09 PM      Component Value Range Status Comment   Specimen Description BLOOD LEFT ANTECUBITAL   Final    Special Requests BOTTLES DRAWN AEROBIC ONLY 3CC   Final    Culture  Setup Time [id number]   Final    Culture     Final    Value:        BLOOD CULTURE RECEIVED NO GROWTH TO DATE CULTURE WILL BE HELD FOR 5 DAYS BEFORE ISSUING A FINAL NEGATIVE REPORT   Report Status PENDING   Incomplete     Studies/Results: Portable Chest Xray In Am  01/24/2012  *RADIOLOGY  REPORT*  Clinical Data: Pneumonia  PORTABLE CHEST - 1 VIEW  Comparison: 01/23/2012; 10/23/2011; chest CT - 10/22/2011  Findings: Unchanged cardiac silhouette and mediastinal contours. Stable positioning of support apparatus.  Grossly unchanged right mid and lower lung heterogeneous air space opacities.  Grossly unchanged left basilar/retrocardiac heterogeneous opacities.  The pulmonary vasculature remains indistinct.  Persistent blunting of the right costophrenic angle suggesting a small right-sided pleural effusion. No pneumothorax.  Unchanged bones.  IMPRESSION: 1.  Grossly unchanged right mid and lower lung heterogeneous air space opacity worrisome for infection. 2.  Grossly unchanged left basilar/retrocardiac opacity, possibly atelectasis. 3.  Unchanged findings of mild pulmonary edema and small right- sided effusion.  Original Report Authenticated By: Waynard Reeds, M.D.   Portable Chest Xray  01/23/2012  *RADIOLOGY REPORT*  Clinical Data: Increased shortness of breath  PORTABLE CHEST - 1 VIEW  Comparison: Portable chest x-ray of 01/23/2012  Findings: There has been some improvement in airspace disease at the right lung base and in the right mid lung.  A small right effusion remains.  Mild cardiomegaly is stable.  There is still evidence of mild pulmonary vascular congestion.  Right central venous line is unchanged in position.  IMPRESSION: Some improvement in airspace disease.  Persistent cardiomegaly, right effusion, and mild pulmonary vascular congestion.  Original Report Authenticated By: Juline Patch, M.D.    Medications: Scheduled Meds:   . amLODipine  10 mg Oral Daily  . antiseptic oral rinse  15 mL Mouth Rinse q12n4p  . aspirin  81 mg Oral Daily  . cloNIDine  0.2 mg Oral TID  . darbepoetin (ARANESP) injection - DIALYSIS  25 mcg Intravenous Q Wed-HD  . fosfomycin  3 g Oral Once  . hydrALAZINE  50 mg Oral BID  . insulin aspart  0-15 Units Subcutaneous TID WC  . insulin aspart  0-5  Units Subcutaneous QHS  . insulin aspart  4 Units Subcutaneous TID WC  . insulin glargine  18 Units Subcutaneous QHS  . isosorbide mononitrate  60 mg Oral Daily  . levothyroxine  25 mcg Oral Daily  . meropenem (MERREM) IV  500 mg Intravenous Q24H  . pantoprazole  40 mg Oral Daily  . paricalcitol  1 mcg Intravenous Q M,W,F-HD  . warfarin  2 mg Oral ONCE-1800  . warfarin  2  mg Oral ONCE-1800  . Warfarin - Pharmacist Dosing Inpatient   Does not apply q1800   Continuous Infusions:    Assessment/Plan:   Acute-on-chronic respiratory failure in setting of Presumed HCAP RML and RLL  - PCCM had narrowed abx to fortaz alone - ON  merrem due to urine cx   E coli UTI - ESBL  cx confirms ESBL - d/c fortaz in favor of merrem - Received fosfomycin after Dr. Pandora Leiter discussion with Dr. Ilsa Iha (infectious disease), currently on meropenem   History of DVT and PE :Remains on anticoag-INR therapeutic   ESRD on dialysis: M/W/F   Nephrology following-appreciate input   Diabetes mellitus not well controlled  adjust-continue SSI Coverage, mealtime insulin 4 u, hs 5 units novolog and Lantus 18 u   Hypothyroidism  On synthroid 25 mcg daily   HTN stable  Anemia :due to chronic disease   Hematuria  Felt to be due to cystitis + coumadin - follow - no indication to d/c coumadin at this time   DVT Prophylaxis: On Coumadin  Code Status:  Disposition: DC tomorrow, PT eval   LOS: 5 days   Mearle Drew M.D. [facility] 01/28/2012, 1:30 PM Pager: [phone]

## 2012-01-28 NOTE — Progress Notes (Signed)
   CARE MANAGEMENT NOTE 01/28/2012  Patient:  [name] [name],[name] [name]   Account Number:  [id number]  Date Initiated:  01/26/2012  Documentation initiated by:  [name]  Subjective/Objective Assessment:   PT WAS ADMITTED WITH DYSPNEA AND OVERLOAD     Action/Plan:   PROGRESSION OF CARE AND DISCHARGE PLANNING   Anticipated DC Date:  01/30/2012   Anticipated DC Plan:  HOME W HOME HEALTH SERVICES      DC Planning Services  CM consult      Choice offered to / List presented to:             Status of service:  In process, will continue to follow Medicare Important Message given?   (If response is "NO", the following Medicare IM given date fields will be blank) Date Medicare IM given:   Date Additional Medicare IM given:    Discharge Disposition:    Per UR Regulation:  Reviewed for med. necessity/level of care/duration of stay  If discussed at Long Length of Stay Meetings, dates discussed:    Comments:  [name], [name]  01/28/12 [name], RN, BSN 1430 PTA PT WAS AT HOME WITH [facility] AND HAS A RN BONNIE AND A PT THERAPTIST NAMED CASEY.  CONFIRMED THAT THEY HAVE MS. [place] AND THAT THEY WILL BE GLAD TO CONT HER SERVICES AT DC.  PLEASE FAX DC SUM, H&P, FACESHEET AND ORDER TO [phone] AT DC.  01/27/2012 1500 Darlyne Russian RN, [place] [phone] Met with patient to discuss discharge planning and home care services. She reports having a nurse 2x/wk from [facility] and the PT was going to end after a few more visits.   UR COMPLETED [name], RN, BSN [phone] 01/26/12

## 2012-01-28 NOTE — Progress Notes (Signed)
ANTICOAGULATION CONSULT NOTE - Follow Up Consult  Pharmacy Consult for Warfarin Indication: Hx recent DVT/PE  Allergies  Allergen Reactions  . Novocain Other (See Comments)    Unknown    Patient Measurements: Height: 5\' 5"  (165.1 cm) Weight: 131 lb 13.4 oz (59.8 kg) IBW/kg (Calculated) : 57   Vital Signs: Temp: 98.1 F (36.7 C) (03/27 1000) Temp src: Oral (03/27 1000) BP: 125/78 mmHg (03/27 1000) Pulse Rate: 89  (03/27 1000)  Labs:  Basename 01/28/12 1610 01/27/12 0623 01/26/12 0445 01/25/12 1031  HGB -- -- 10.6* 12.4  HCT -- -- 35.3* 39.1  PLT -- -- 384 395  APTT -- -- -- --  LABPROT 26.6* 25.7* 26.9* --  INR 2.41* 2.30* 2.44* --  HEPARINUNFRC -- -- -- --  CREATININE -- -- 4.36* 3.85*  CKTOTAL -- -- -- --  CKMB -- -- -- --  TROPONINI -- -- -- --   Estimated Creatinine Clearance: 11.1 ml/min (by C-G formula based on Cr of 4.36).  Assessment: 71 y.o. F on warfarin for recent history of DVT/PE in December 2012 with a therapeutic INR this a.m. (INR 2.41 goal of 2-3). No CBC this a.m, gross hematuria has been noted by both the nephrology and internal medicine teams however both state that there is no indication to hold warfarin at this time. Will continue to monitor for drops in Hgb with next CBC. PTA dose was known to be 2 mg daily  Goal of Therapy:  INR 2-3   Plan:  1. Warfarin 2 mg x 1 dose at 1800 today 2. Will continue to monitor for any signs/symptoms of bleeding and will follow up with PT/INR in the a.m.   Georgina Pillion, PharmD, BCPS Clinical Pharmacist Pager: [phone] 01/28/2012 10:13 AM

## 2012-01-28 NOTE — Evaluation (Signed)
Physical Therapy Evaluation Patient Details Name: [name] [name] MRN: [id number] DOB: 1941/02/24 Today's Date: 01/28/2012  Problem List:  Patient Active Problem List  Diagnoses  . Diabetes mellitus  . Peripheral arterial disease  . Hypercholesterolemia  . Hypothyroidism  . Mycotic toenails  . ARF (acute respiratory failure)  . Pleural effusion, bilateral  . Hyperkalemia  . AKI (acute kidney injury)  . CKD (chronic kidney disease) stage 5, GFR less than 15 ml/min  . DM (diabetes mellitus)  . COPD with acute exacerbation  . HTN (hypertension)  . Anemia  . Cardiac enzymes elevated  . UTI (lower urinary tract infection)  . Acute-on-chronic respiratory failure  . HCAP (healthcare-associated pneumonia)  . ESRD (end stage renal disease) on dialysis  . History of DVT (deep vein thrombosis)  . History of pulmonary embolus (PE)    Past Medical History:  Past Medical History  Diagnosis Date  . Diabetes mellitus   . Hypercholesterolemia   . Hypothyroidism   . Colitis   . Chronic kidney disease   . Coronary artery disease   . Hypertension   . GERD (gastroesophageal reflux disease)   . Myocardial infarction   . Peripheral vascular disease   . COPD (chronic obstructive pulmonary disease)   . Pneumonia    Past Surgical History:  Past Surgical History  Procedure Date  . Partial hysterectomy   . Tubal ligation   . Amputaton of right fifth toe   . Tonsillectomy   . Insertion of dialysis catheter 10/23/2011    Procedure: INSERTION OF DIALYSIS CATHETER;  Surgeon: Chuck Hint, MD;  Location: [facility] OR;  Service: Vascular;  Laterality: Right;  Right internal jugular Insertion diatek catheter    PT Assessment/Plan/Recommendation PT Assessment Clinical Impression Statement: Patient is a 71 y.o female admitted for poorly controlled DM. Patient currently presents at an independent level for all mobility without use of AD. BERG completed with score of 52/56 = low risk of fall. No  further acute PT needs at this time. Recommend return to outpatient PT at hospital discharge to address remaining high level balance and strength deficits. No equipment needs for PT at this time. Please re-consult if status changes. PT Recommendation/Assessment: Patent does not need any further PT services No Skilled PT: Patient at baseline level of functioning;Patient is independent with all acitivity/mobility PT Recommendation Follow Up Recommendations: Outpatient PT (per PTA) Equipment Recommended: None recommended by PT PT Goals     PT Evaluation Precautions/Restrictions  Precautions Required Braces or Orthoses: No Restrictions Weight Bearing Restrictions: No Prior Functioning  Home Living Lives With: Sheran Spine Help From: Family Type of Home: House Home Layout: One level Home Access: Ramped entrance Bathroom Shower/Tub: Other (comment) (sponge bath at sink PTA) Bathroom Toilet: Standard Bathroom Accessibility: Yes How Accessible: Accessible via walker Home Adaptive Equipment: Bedside commode/3-in-1 Prior Function Level of Independence: Independent with basic ADLs;Independent with transfers;Independent with homemaking with ambulation;Independent with gait Driving: No Vocation: Retired Comments: went to outpatient PT PTA and would like to return to her same therapist at d/c from hospital Cognition Cognition Arousal/Alertness: Awake/alert Overall Cognitive Status: Appears within functional limits for tasks assessed Orientation Level: Oriented X4 Sensation/Coordination Sensation Light Touch: Appears Intact Stereognosis: Not tested Hot/Cold: Not tested Proprioception: Appears Intact Coordination Gross Motor Movements are Fluid and Coordinated: Yes Fine Motor Movements are Fluid and Coordinated: Yes Extremity Assessment RLE Assessment RLE Assessment: Within Functional Limits LLE Assessment LLE Assessment: Within Functional Limits Mobility (including Balance) Bed  Mobility Bed Mobility: Yes Supine to  Sit: 6: Modified independent (Device/Increase time);HOB flat;With rails Sitting - Scoot to Edge of Bed: 6: Modified independent (Device/Increase time);Other (comment) (with UE assist) Transfers Transfers: Yes Sit to Stand: 6: Modified independent (Device/Increase time);From bed;From chair/3-in-1;With upper extremity assist Stand to Sit: 6: Modified independent (Device/Increase time);To chair/3-in-1;With upper extremity assist Stand Pivot Transfers: 6: Modified independent (Device/Increase time) Ambulation/Gait Ambulation/Gait: Yes Ambulation/Gait Assistance: 7: Independent Ambulation Distance (Feet): 50 Feet Assistive device: None Gait Pattern: Within Functional Limits Gait velocity: WFL Stairs: No Wheelchair Mobility Wheelchair Mobility: No  Posture/Postural Control Posture/Postural Control: No significant limitations Balance Balance Assessed: Yes Static Sitting Balance Static Sitting - Balance Support: No upper extremity supported;Feet supported Static Sitting - Level of Assistance: 7: Independent Dynamic Sitting Balance Dynamic Sitting - Balance Support: No upper extremity supported;Feet supported;During functional activity Dynamic Sitting - Level of Assistance: 7: Independent Static Standing Balance Static Standing - Balance Support: No upper extremity supported Static Standing - Level of Assistance: 7: Independent Dynamic Standing Balance Dynamic Standing - Balance Support: No upper extremity supported;During functional activity Dynamic Standing - Level of Assistance: 7: Independent Berg Balance Test Sit to Stand: Able to stand without using hands and stabilize independently Standing Unsupported: Able to stand safely 2 minutes Sitting with Back Unsupported but Feet Supported on Floor or Stool: Able to sit safely and securely 2 minutes Stand to Sit: Sits safely with minimal use of hands Transfers: Able to transfer safely, minor use of  hands Standing Unsupported with Eyes Closed: Able to stand 10 seconds safely Standing Ubsupported with Feet Together: Able to place feet together independently and stand 1 minute safely From Standing, Reach Forward with Outstretched Arm: Can reach confidently >25 cm (10") From Standing Position, Pick up Object from Floor: Able to pick up shoe safely and easily From Standing Position, Turn to Look Behind Over each Shoulder: Looks behind from both sides and weight shifts well Turn 360 Degrees: Able to turn 360 degrees safely in 4 seconds or less Standing Unsupported, Alternately Place Feet on Step/Stool: Able to stand independently and complete 8 steps >20 seconds Standing Unsupported, One Foot in Front: Able to plae foot ahead of the other independently and hold 30 seconds Standing on One Leg: Able to lift leg independently and hold equal to or more than 3 seconds Total Score: 52  Exercise  In room HEP given to prevent complications of immobility. Patient returned demonstration without assist. Also recommended continued gait in room and in hall with nursing. Patient and nursing in agreement with this plan. General Exercises - Lower Extremity Ankle Circles/Pumps: AROM;Both;Seated;10 reps Gluteal Sets: AROM;Both;Seated;10 reps Long Arc Quad: AROM;Both;10 reps;Seated Hip Flexion/Marching: AROM;10 reps;Both;Seated End of Session PT - End of Session Equipment Utilized During Treatment: Gait belt Activity Tolerance: Patient tolerated treatment well Patient left: in chair;with call bell in reach Nurse Communication: Mobility status for transfers;Mobility status for ambulation General Behavior During Session: [facility] for tasks performed Cognition: [facility] for tasks performed  Romeo Rabon 01/28/2012, 10:31 AM

## 2012-01-29 LAB — CULTURE, BLOOD (ROUTINE X 2): Culture  Setup Time: 201303222314

## 2012-01-29 LAB — GLUCOSE, CAPILLARY: Glucose-Capillary: 94 mg/dL (ref 70–99)

## 2012-01-29 LAB — PROTIME-INR
INR: 2.5 — ABNORMAL HIGH (ref 0.00–1.49)
Prothrombin Time: 27.4 seconds — ABNORMAL HIGH (ref 11.6–15.2)

## 2012-01-29 MED ORDER — LEVOFLOXACIN 750 MG PO TABS
750.0000 mg | ORAL_TABLET | Freq: Every day | ORAL | Status: DC
  Administered 2012-01-29: 750 mg via ORAL
  Filled 2012-01-29: qty 1

## 2012-01-29 MED ORDER — WARFARIN SODIUM 2 MG PO TABS
2.0000 mg | ORAL_TABLET | Freq: Once | ORAL | Status: DC
  Filled 2012-01-29: qty 1

## 2012-01-29 NOTE — Progress Notes (Signed)
ANTICOAGULATION CONSULT NOTE - Follow Up Consult  Pharmacy Consult for Warfarin Indication: Hx recent DVT/PE  Allergies  Allergen Reactions  . Novocain Other (See Comments)    Unknown    Patient Measurements: Height: 5\' 5"  (165.1 cm) Weight: 130 lb 1.6 oz (59.013 kg) IBW/kg (Calculated) : 57   Vital Signs: Temp: 98.4 F (36.9 C) (03/28 0937) Temp src: Oral (03/28 0937) BP: 111/64 mmHg (03/28 0937) Pulse Rate: 96  (03/28 0937)  Labs:  Basename 01/29/12 0500 01/28/12 0838 01/27/12 0623  HGB -- -- --  HCT -- -- --  PLT -- -- --  APTT -- -- --  LABPROT 27.4* 26.6* 25.7*  INR 2.50* 2.41* 2.30*  HEPARINUNFRC -- -- --  CREATININE -- -- --  CKTOTAL -- -- --  CKMB -- -- --  TROPONINI -- -- --   Estimated Creatinine Clearance: 11.1 ml/min (by C-G formula based on Cr of 4.36).  Assessment: 71 y.o. F on warfarin for recent history of DVT/PE in December 2012 with a therapeutic INR this a.m. (INR 2.50 goal of 2-3). No CBC this a.m, gross hematuria has been noted by both the nephrology and internal medicine teams however both state that there is no indication to hold warfarin at this time. PTA dose = 2 mg daily  Goal of Therapy:  INR 2-3   Plan:  1. Warfarin 2 mg x 1 dose at 1800 today (if patient still here) 2. Continue home regimen upon discharge  Janace Litten, PharmD [phone] Clinical Pharmacist Pager: [phone] 01/29/2012 11:08 AM

## 2012-01-29 NOTE — Progress Notes (Signed)
PT NOTE:  01/29/2012  Received new order for PT evaluation.  Pt was evaluated by PT on 01/28/12 and found to have no acute PT needs.  Spoke with pt today and found no change in function from prior eval.  Acute PT signing off.  Shariya Gaster L. Novalyn Lajara DPT [phone]

## 2012-01-29 NOTE — Progress Notes (Signed)
   CARE MANAGEMENT NOTE 01/29/2012  Patient:  [name] [name],[name] [name]   Account Number:  [id number]  Date Initiated:  01/26/2012  Documentation initiated by:  [name]  Subjective/Objective Assessment:   PT WAS ADMITTED WITH DYSPNEA AND OVERLOAD     Action/Plan:   PROGRESSION OF CARE AND DISCHARGE PLANNING   Anticipated DC Date:  01/30/2012   Anticipated DC Plan:  HOME W HOME HEALTH SERVICES      DC Planning Services  CM consult      Choice offered to / List presented to:          [facility] arranged  HH-2 PT  HH-1 RN      [facility] agency  [facility]   Status of service:  Completed, signed off Medicare Important Message given?   (If response is "NO", the following Medicare IM given date fields will be blank) Date Medicare IM given:   Date Additional Medicare IM given:    Discharge Disposition:  HOME W HOME HEALTH SERVICES  Per UR Regulation:  Reviewed for med. necessity/level of care/duration of stay  If discussed at Long Length of Stay Meetings, dates discussed:    Comments:  MVH:QIONGE, [name]  01/29/12 [name], RN, BSN 1104 PT WAS DC'D WITH [facility]ROUGH [facility] FOR PT/RN, ORDERS FAXED TO THEM.  01/28/12 [name], RN, BSN 1430 PTA PT WAS AT HOME WITH [facility] HEALTH AND HAS A RN BONNIE AND A PT THERAPTIST NAMED CASEY.  CONFIRMED THAT THEY HAVE [employer] AND THAT THEY WILL BE GLAD TO CONT HER SERVICES AT DC.  PLEASE FAX DC SUM, H&P, FACESHEET AND ORDER TO [phone] AT DC.  01/27/2012 1500 Darlyne Russian RN, [place] [phone] Met with patient to discuss discharge planning and home care services. She reports having a nurse 2x/wk from [facility] and the PT was going to end after a few more visits.   UR COMPLETED [name], RN, BSN [phone] 01/26/12

## 2012-01-29 NOTE — Progress Notes (Signed)
Subjective:  No cos , for dc today Objective Vital signs in last 24 hours: Filed Vitals:   01/28/12 2015 01/28/12 2157 01/29/12 0522 01/29/12 0937  BP: 150/58 120/72 133/50 111/64  Pulse: 102 107 90 96  Temp:  97.4 F (36.3 C) 97.5 F (36.4 C) 98.4 F (36.9 C)  TempSrc:  Oral Oral Oral  Resp: 16 18 18 20   Height:  5\' 5"  (1.651 m)    Weight:  59.013 kg (130 lb 1.6 oz)    SpO2:  93% 95% 96%   Weight change:   Intake/Output Summary (Last 24 hours) at 01/29/12 1032 Last data filed at 01/29/12 0900  Gross per 24 hour  Intake   1010 ml  Output   2315 ml  Net  -1305 ml   Labs: Basic Metabolic Panel:  Lab 01/26/12 1610 01/25/12 1031 01/24/12 0620 01/23/12 1131  NA 130* 131* 135 --  K 4.0 3.6 3.5 --  CL 92* 92* 96 --  CO2 24 22 28  --  GLUCOSE 164* 188* 127* --  BUN 34* 27* 10 --  CREATININE 4.36* 3.85* 2.19* --  CALCIUM 9.4 9.7 8.9 --  ALB -- -- -- --  PHOS -- -- 2.0* 3.7   Liver Function Tests:  Lab 01/23/12 1131  AST 11  ALT 7  ALKPHOS 101  BILITOT 0.4  PROT 7.3  ALBUMIN 2.7*   No results found for this basename: LIPASE:3,AMYLASE:3 in the last 168 hours No results found for this basename: AMMONIA:3 in the last 168 hours CBC:  Lab 01/26/12 0445 01/25/12 1031 01/24/12 0620 01/23/12 1131  WBC 9.7 16.5* 15.6* --  NEUTROABS -- -- -- --  HGB 10.6* 12.4 10.5* --  HCT 35.3* 39.1 34.1* --  MCV 86.9 86.3 87.7 86.5  PLT 384 395 349 --   Cardiac Enzymes:  Lab 01/24/12 0200 01/23/12 1758 01/23/12 1131  CKTOTAL 22 18 21   CKMB 0.9 0.8 1.0  CKMBINDEX -- -- --  TROPONINI <0.30 <0.30 <0.30   CBG:  Lab 01/29/12 0848 01/28/12 2153 01/28/12 1131 01/28/12 0748 01/27/12 2222  GLUCAP 123* 189* 207* 137* 146*    Iron Studies: No results found for this basename: IRON,TIBC,TRANSFERRIN,FERRITIN in the last 72 hours Studies/Results: No results found. Medications:      . amLODipine  10 mg Oral Daily  . antiseptic oral rinse  15 mL Mouth Rinse q12n4p  . aspirin  81 mg  Oral Daily  . cloNIDine  0.2 mg Oral TID  . darbepoetin (ARANESP) injection - DIALYSIS  25 mcg Intravenous Q Wed-HD  . insulin aspart  0-15 Units Subcutaneous TID WC  . insulin aspart  0-5 Units Subcutaneous QHS  . insulin aspart  4 Units Subcutaneous TID WC  . insulin glargine  18 Units Subcutaneous QHS  . isosorbide mononitrate  60 mg Oral Daily  . levofloxacin  750 mg Oral Daily  . levothyroxine  25 mcg Oral Daily  . pantoprazole  40 mg Oral Daily  . paricalcitol  1 mcg Intravenous Q M,W,F-HD  . warfarin  2 mg Oral ONCE-1800  . Warfarin - Pharmacist Dosing Inpatient   Does not apply q1800  . DISCONTD: hydrALAZINE  50 mg Oral BID  . DISCONTD: meropenem (MERREM) IV  500 mg Intravenous Q24H   I  have reviewed scheduled and prn medications.  Physical Exam: General:Alert, Nad, Pleasant  Heart: RRR, no rub or murmur  Lungs: Bilat basilar sounds decreased, otherwise clear this am Abdomen:soft, nontender  Extremities: Dialysis Access: no  pedal edema, positve bruit Right upper arm avf with mild bruising /right ij permcath  Problem/Plan:  1. ESRD-MWF dialysis [place], HD yesterday , below edw= new edw 59.0kg 2.  ANEMIA-No Epo, Iron every week. Hbg 10.6 start low dose ESA with mild drop 3. MBD-Zemplar 1 mcg, Phos low > d/c'd phoslo for now (was on 3ac at home)phos 2.0 on 3/23 , ca was 9.4 on 3/25, fu with op labs 4. HTN- on 4 BP meds at home- BB, clonidine, hydralazine, amlodipine. Not on BB here. Have d/c hydralazine due to soft BP's. Continue Norvasc and clonidine for now. Weight is down 2-3 kg below EDW.  111/64 bp this am, also Isordil 60mg  5. ACCESS-AVF Right and Catheter, not using avf secondary to mild bruising on Coumadin , NEED TO FOLLOW UP AT [facility] for use 6. Pneumonia/COPD- Vanc d/c'd, Elita Quick continued (UTI also) with Merrem dosing , with 7 days antibiotics, no further dosing  Pre dc team  7.E Coli UTI- ESBL-finished antibiotics. 8. Coumadin AC- INR = 2.50 today for  recent DVT/PE   For dc today Lenny Pastel, PA-C [facility] Beeper [phone] 01/29/2012,10:32 AM  LOS: 6 days   Patient seen and examined and agree with assessment and plan as above. Patient for d/c today, resume outpt HD MWF.  [name]  MD [facility] [phone] pgr    [phone] cell 01/29/2012, 11:00 AM

## 2012-01-29 NOTE — Discharge Summary (Signed)
Physician Discharge Summary  Patient ID: [name] [name] MRN: [id number] DOB/AGE: [birth year] 69 y.o.  Admit date: 01/23/2012 Discharge date: 01/29/2012  Primary Care Physician:  Quentin Mulling, MD, MD  Discharge Diagnoses:    ESBL Escherichia coli UTI have completed the course of antibiotics  .Anemia .HTN (hypertension) .Acute-on-chronic respiratory failure requiring  .HCAP (healthcare-associated pneumonia), completed course of antibiotics  .ESRD (end stage renal disease) on dialysis .History of pulmonary embolus (PE) on Coumadin  Consults: Renal service                    Patient was admitted by pulmonary critical care service on 01/23/2012, transferred to [facility] hospitalist service on 01/26/2012   Discharge Medications: Medication List  As of 01/29/2012 10:02 AM   STOP taking these medications         hydrALAZINE 50 MG tablet      metoprolol 50 MG tablet         TAKE these medications         amLODipine 10 MG tablet   Commonly known as: NORVASC   Take 10 mg by mouth daily.      aspirin 81 MG chewable tablet   Chew 1 tablet (81 mg total) by mouth daily.      calcium acetate 667 MG capsule   Commonly known as: PHOSLO   Take 1 capsule (667 mg total) by mouth 3 (three) times daily with meals.      cloNIDine 0.2 MG tablet   Commonly known as: CATAPRES   Take 0.2 mg by mouth 3 (three) times daily.      insulin glargine 100 UNIT/ML injection   Commonly known as: LANTUS   Inject 18 Units into the skin at bedtime.      isosorbide mononitrate 120 MG 24 hr tablet   Commonly known as: IMDUR   Take 120 mg by mouth daily.      levothyroxine 25 MCG tablet   Commonly known as: SYNTHROID, LEVOTHROID   Take 25 mcg by mouth daily.      nitroGLYCERIN 0.4 MG SL tablet   Commonly known as: NITROSTAT   Place 0.4 mg under the tongue every 5 (five) minutes as needed. For chest pain.      oxyCODONE-acetaminophen 5-325 MG per tablet   Commonly known as: PERCOCET   Take 1 tablet by  mouth every 6 (six) hours as needed. For pain.      pantoprazole 40 MG tablet   Commonly known as: PROTONIX   Take 40 mg by mouth daily.      PROBIOTIC PO   Take 1 capsule by mouth daily.      ranitidine 150 MG tablet   Commonly known as: ZANTAC   Take 150 mg by mouth 2 (two) times daily.      warfarin 2 MG tablet   Commonly known as: COUMADIN   Take 2 mg by mouth daily.             Brief H and P: For complete details please refer to admission H and P, but in brief, 71 year old female with history of end-stage renal disease, COPD on 2 L at home, recent DVT/PE diagnosed in December 2012, patient was in rehabilitation for 2 months and recently discharged to home in February 2013. She presented to ER at [facility] on March 22 with 2 day history of increasing fatigue, one day history of cough with purulent sputum and progressive dyspnea, low-grade fevers, chills and posterior chest pain with  cough. Patient had sick exposures at hemodialysis center. In ER patient was found to be hypoxic with dense right lower lung airspace opacification and [facility] for pneumonia and respiratory failure.   Hospital Course:   Acute-on-chronic respiratory failure in setting of Presumed HCAP RML and RLL: Patient was admitted by PCCM service on 01/23/2012 to ICU. It was presumed HCAP due to recent hospitalization and rehabilitation. Patient was started on vancomycin, Fortaz and azithromycin. Blood culture remained negative to date. Per PCCM, Legionella unlikely, Zithromax was discontinued, Vancomycin was discontinued on 01/25/2012. Urine culture showed ESBL Escherichia coli UTI hence patient was placed on meropenem. Patient has received 7 days of  IV antibiotics and one dose of fosfomycin. Prior to discharge patient was given 750 mg of Levaquin to complete the antibiotic course. She does not require any antibiotics after the discharge.   E coli UTI - ESBL:    Received fosfomycin  after Dr. Pandora Leiter discussion with Dr. Ilsa Iha (infectious disease), then placed on meropenem. Patient has completed the course of antibiotics and does not require any further antibiotics    History of DVT and PE :Remains on anticoag-INR therapeutic   ESRD on dialysis: M/W/F: Patient was continued on her hemodialysis per schedule, followed by renal service closely.  Nephrology following-appreciate input   Diabetes mellitus: Patient was maintained on insulin regimen while inpatient.   Hypothyroidism  On synthroid 25 mcg daily   HTN: Patient had some borderline BP readings, on 4 different antihypertensives at home. I have placed a hold on hydralazine and beta blocker on home antihypertensives until she follows up with PCP, Dr. Quintella Reichert.  Hematuria  Felt to be due to cystitis + coumadin - follow - no indication to d/c coumadin     Day of Discharge BP 111/64  Pulse 96  Temp(Src) 98.4 F (36.9 C) (Oral)  Resp 20  Ht 5\' 5"  (1.651 m)  Wt 59.013 kg (130 lb 1.6 oz)  BMI 21.65 kg/m2  SpO2 96%  Physical Exam: General: Alert and awake oriented x3 not in any acute distress. HEENT: anicteric sclera, pupils reactive to light and accommodation CVS: S1-S2 clear no murmur rubs or gallops Chest: clear to auscultation bilaterally, no wheezing rales or rhonchi Abdomen: soft nontender, nondistended, normal bowel sounds, no organomegaly Extremities: no cyanosis, clubbing or edema noted bilaterally Neuro: Cranial nerves II-XII intact, no focal neurological deficits   The results of significant diagnostics from this hospitalization (including imaging, microbiology, ancillary and laboratory) are listed below for reference.    LAB RESULTS: Basic Metabolic Panel:  Lab 01/26/12 2130 01/25/12 1031 01/24/12 0620  NA 130* 131* --  K 4.0 3.6 --  CL 92* 92* --  CO2 24 22 --  GLUCOSE 164* 188* --  BUN 34* 27* --  CREATININE 4.36* 3.85* --  CALCIUM 9.4 9.7 --  MG -- -- 1.8  PHOS -- -- 2.0*   Liver  Function Tests:  Lab 01/23/12 1131  AST 11  ALT 7  ALKPHOS 101  BILITOT 0.4  PROT 7.3  ALBUMIN 2.7*   CBC:  Lab 01/26/12 0445 01/25/12 1031  WBC 9.7 16.5*  NEUTROABS -- --  HGB 10.6* 12.4  HCT 35.3* 39.1  MCV 86.9 --  PLT 384 395   Cardiac Enzymes:  Lab 01/24/12 0200 01/23/12 1758  CKTOTAL 22 18  CKMB 0.9 0.8  CKMBINDEX -- --  TROPONINI <0.30 <0.30   BNP: No components found with this basename: POCBNP:2 CBG:  Lab 01/29/12 0848 01/28/12  2153  GLUCAP 123* 189*    Significant Diagnostic Studies:  Portable Chest Xray In Am  01/24/2012  *RADIOLOGY REPORT*  Clinical Data: Pneumonia  PORTABLE CHEST - 1 VIEW  Comparison: 01/23/2012; 10/23/2011; chest CT - 10/22/2011  Findings: Unchanged cardiac silhouette and mediastinal contours. Stable positioning of support apparatus.  Grossly unchanged right mid and lower lung heterogeneous air space opacities.  Grossly unchanged left basilar/retrocardiac heterogeneous opacities.  The pulmonary vasculature remains indistinct.  Persistent blunting of the right costophrenic angle suggesting a small right-sided pleural effusion. No pneumothorax.  Unchanged bones.  IMPRESSION: 1.  Grossly unchanged right mid and lower lung heterogeneous air space opacity worrisome for infection. 2.  Grossly unchanged left basilar/retrocardiac opacity, possibly atelectasis. 3.  Unchanged findings of mild pulmonary edema and small right- sided effusion.  Original Report Authenticated By: Waynard Reeds, M.D.   Portable Chest Xray  01/23/2012  *RADIOLOGY REPORT*  Clinical Data: Increased shortness of breath  PORTABLE CHEST - 1 VIEW  Comparison: Portable chest x-ray of 01/23/2012  Findings: There has been some improvement in airspace disease at the right lung base and in the right mid lung.  A small right effusion remains.  Mild cardiomegaly is stable.  There is still evidence of mild pulmonary vascular congestion.  Right central venous line is unchanged in position.   IMPRESSION: Some improvement in airspace disease.  Persistent cardiomegaly, right effusion, and mild pulmonary vascular congestion.  Original Report Authenticated By: Juline Patch, M.D.     Disposition and Follow-up: Discharge Orders    Future Orders Please Complete By Expires   Diet Carb Modified      Increase activity slowly      Discharge instructions      Comments:   Please HOLD hydralazine, metoprolol from your home BP medications until you follow-up with Dr Quintella Reichert       DISPOSITION: Home  DIET renal diet/diabetic  ACTIVITY: As tolerated, home PT and RN was arranged  DISCHARGE FOLLOW-UP Follow-up Information    Follow up with HOOPER,[name], MD. Schedule an appointment as soon as possible for a visit in 10 days. (for hospital follow-up)    Contact information:   [address] [place] [address] [phone]          Time spent on Discharge: 45 minutes  Signed:  Neil Brickell M.D. [facility] 01/29/2012, 10:02 AM

## 2012-02-04 ENCOUNTER — Emergency Department (HOSPITAL_COMMUNITY): Payer: Medicare Other

## 2012-02-04 ENCOUNTER — Inpatient Hospital Stay (HOSPITAL_COMMUNITY)
Admission: EM | Admit: 2012-02-04 | Discharge: 2012-02-14 | DRG: 981 | Disposition: A | Discharge status: Home Health | Payer: Medicare Other | Source: Ambulatory Visit | Attending: Internal Medicine | Admitting: Internal Medicine

## 2012-02-04 ENCOUNTER — Other Ambulatory Visit: Payer: Self-pay

## 2012-02-04 ENCOUNTER — Encounter (HOSPITAL_COMMUNITY): Payer: Self-pay | Admitting: Emergency Medicine

## 2012-02-04 DIAGNOSIS — I2510 Atherosclerotic heart disease of native coronary artery without angina pectoris: Secondary | ICD-10-CM | POA: Diagnosis present

## 2012-02-04 DIAGNOSIS — R748 Abnormal levels of other serum enzymes: Secondary | ICD-10-CM

## 2012-02-04 DIAGNOSIS — E875 Hyperkalemia: Secondary | ICD-10-CM

## 2012-02-04 DIAGNOSIS — Z7902 Long term (current) use of antithrombotics/antiplatelets: Secondary | ICD-10-CM

## 2012-02-04 DIAGNOSIS — N185 Chronic kidney disease, stage 5: Secondary | ICD-10-CM

## 2012-02-04 DIAGNOSIS — N2581 Secondary hyperparathyroidism of renal origin: Secondary | ICD-10-CM | POA: Diagnosis present

## 2012-02-04 DIAGNOSIS — I2582 Chronic total occlusion of coronary artery: Secondary | ICD-10-CM | POA: Diagnosis present

## 2012-02-04 DIAGNOSIS — E039 Hypothyroidism, unspecified: Secondary | ICD-10-CM | POA: Diagnosis present

## 2012-02-04 DIAGNOSIS — Z9981 Dependence on supplemental oxygen: Secondary | ICD-10-CM

## 2012-02-04 DIAGNOSIS — Z86718 Personal history of other venous thrombosis and embolism: Secondary | ICD-10-CM

## 2012-02-04 DIAGNOSIS — I120 Hypertensive chronic kidney disease with stage 5 chronic kidney disease or end stage renal disease: Secondary | ICD-10-CM | POA: Diagnosis present

## 2012-02-04 DIAGNOSIS — I10 Essential (primary) hypertension: Secondary | ICD-10-CM

## 2012-02-04 DIAGNOSIS — K219 Gastro-esophageal reflux disease without esophagitis: Secondary | ICD-10-CM | POA: Diagnosis present

## 2012-02-04 DIAGNOSIS — N390 Urinary tract infection, site not specified: Secondary | ICD-10-CM

## 2012-02-04 DIAGNOSIS — D72829 Elevated white blood cell count, unspecified: Secondary | ICD-10-CM | POA: Diagnosis not present

## 2012-02-04 DIAGNOSIS — I519 Heart disease, unspecified: Secondary | ICD-10-CM

## 2012-02-04 DIAGNOSIS — E7800 Pure hypercholesterolemia, unspecified: Secondary | ICD-10-CM | POA: Diagnosis present

## 2012-02-04 DIAGNOSIS — E119 Type 2 diabetes mellitus without complications: Secondary | ICD-10-CM | POA: Diagnosis present

## 2012-02-04 DIAGNOSIS — I739 Peripheral vascular disease, unspecified: Secondary | ICD-10-CM | POA: Diagnosis present

## 2012-02-04 DIAGNOSIS — J90 Pleural effusion, not elsewhere classified: Secondary | ICD-10-CM | POA: Diagnosis present

## 2012-02-04 DIAGNOSIS — Z7901 Long term (current) use of anticoagulants: Secondary | ICD-10-CM

## 2012-02-04 DIAGNOSIS — R112 Nausea with vomiting, unspecified: Secondary | ICD-10-CM | POA: Diagnosis not present

## 2012-02-04 DIAGNOSIS — T380X5A Adverse effect of glucocorticoids and synthetic analogues, initial encounter: Secondary | ICD-10-CM | POA: Diagnosis not present

## 2012-02-04 DIAGNOSIS — I248 Other forms of acute ischemic heart disease: Secondary | ICD-10-CM | POA: Diagnosis present

## 2012-02-04 DIAGNOSIS — I252 Old myocardial infarction: Secondary | ICD-10-CM

## 2012-02-04 DIAGNOSIS — J9819 Other pulmonary collapse: Secondary | ICD-10-CM | POA: Diagnosis present

## 2012-02-04 DIAGNOSIS — I214 Non-ST elevation (NSTEMI) myocardial infarction: Secondary | ICD-10-CM | POA: Diagnosis not present

## 2012-02-04 DIAGNOSIS — E46 Unspecified protein-calorie malnutrition: Secondary | ICD-10-CM | POA: Diagnosis present

## 2012-02-04 DIAGNOSIS — J189 Pneumonia, unspecified organism: Principal | ICD-10-CM | POA: Diagnosis present

## 2012-02-04 DIAGNOSIS — Z794 Long term (current) use of insulin: Secondary | ICD-10-CM

## 2012-02-04 DIAGNOSIS — J810 Acute pulmonary edema: Secondary | ICD-10-CM | POA: Diagnosis not present

## 2012-02-04 DIAGNOSIS — Z7982 Long term (current) use of aspirin: Secondary | ICD-10-CM

## 2012-02-04 DIAGNOSIS — E876 Hypokalemia: Secondary | ICD-10-CM | POA: Diagnosis present

## 2012-02-04 DIAGNOSIS — R791 Abnormal coagulation profile: Secondary | ICD-10-CM | POA: Diagnosis present

## 2012-02-04 DIAGNOSIS — IMO0002 Reserved for concepts with insufficient information to code with codable children: Secondary | ICD-10-CM

## 2012-02-04 DIAGNOSIS — N179 Acute kidney failure, unspecified: Secondary | ICD-10-CM | POA: Diagnosis present

## 2012-02-04 DIAGNOSIS — Z86711 Personal history of pulmonary embolism: Secondary | ICD-10-CM

## 2012-02-04 DIAGNOSIS — D649 Anemia, unspecified: Secondary | ICD-10-CM | POA: Diagnosis present

## 2012-02-04 DIAGNOSIS — Z87891 Personal history of nicotine dependence: Secondary | ICD-10-CM

## 2012-02-04 DIAGNOSIS — I2489 Other forms of acute ischemic heart disease: Secondary | ICD-10-CM | POA: Diagnosis present

## 2012-02-04 DIAGNOSIS — R000 Tachycardia, unspecified: Secondary | ICD-10-CM | POA: Diagnosis present

## 2012-02-04 DIAGNOSIS — Z888 Allergy status to other drugs, medicaments and biological substances status: Secondary | ICD-10-CM

## 2012-02-04 DIAGNOSIS — Z992 Dependence on renal dialysis: Secondary | ICD-10-CM

## 2012-02-04 DIAGNOSIS — S98139A Complete traumatic amputation of one unspecified lesser toe, initial encounter: Secondary | ICD-10-CM

## 2012-02-04 DIAGNOSIS — J9620 Acute and chronic respiratory failure, unspecified whether with hypoxia or hypercapnia: Secondary | ICD-10-CM | POA: Diagnosis present

## 2012-02-04 DIAGNOSIS — N186 End stage renal disease: Secondary | ICD-10-CM | POA: Diagnosis present

## 2012-02-04 DIAGNOSIS — E785 Hyperlipidemia, unspecified: Secondary | ICD-10-CM | POA: Diagnosis present

## 2012-02-04 DIAGNOSIS — T45515A Adverse effect of anticoagulants, initial encounter: Secondary | ICD-10-CM | POA: Diagnosis present

## 2012-02-04 DIAGNOSIS — R9439 Abnormal result of other cardiovascular function study: Secondary | ICD-10-CM

## 2012-02-04 DIAGNOSIS — J441 Chronic obstructive pulmonary disease with (acute) exacerbation: Secondary | ICD-10-CM | POA: Diagnosis present

## 2012-02-04 DIAGNOSIS — B351 Tinea unguium: Secondary | ICD-10-CM

## 2012-02-04 DIAGNOSIS — J9600 Acute respiratory failure, unspecified whether with hypoxia or hypercapnia: Secondary | ICD-10-CM

## 2012-02-04 HISTORY — PX: CARDIAC CATHETERIZATION: SHX172

## 2012-02-04 HISTORY — DX: Shortness of breath: R06.02

## 2012-02-04 LAB — DIFFERENTIAL
Basophils Relative: 1 % (ref 0–1)
Eosinophils Absolute: 0 10*3/uL (ref 0.0–0.7)
Neutrophils Relative %: 85 % — ABNORMAL HIGH (ref 43–77)

## 2012-02-04 LAB — OCCULT BLOOD, POC DEVICE: Fecal Occult Bld: POSITIVE

## 2012-02-04 LAB — CARDIAC PANEL(CRET KIN+CKTOT+MB+TROPI)
Relative Index: INVALID (ref 0.0–2.5)
Troponin I: <0.3 ng/mL (ref <0.30)

## 2012-02-04 LAB — CBC
MCH: 27 pg (ref 26.0–34.0)
MCHC: 31.1 g/dL (ref 30.0–36.0)
Platelets: 379 10*3/uL (ref 150–400)

## 2012-02-04 LAB — POCT I-STAT, CHEM 8
BUN: 10 mg/dL (ref 6–23)
Hemoglobin: 12.2 g/dL (ref 12.0–15.0)
Sodium: 136 mEq/L (ref 135–145)
TCO2: 32 mmol/L (ref 0–100)

## 2012-02-04 LAB — LACTIC ACID, PLASMA: Lactic Acid, Venous: 1.3 mmol/L (ref 0.5–2.2)

## 2012-02-04 LAB — PROTIME-INR
INR: 5.18 (ref 0.00–1.49)
Prothrombin Time: 48.5 seconds — ABNORMAL HIGH (ref 11.6–15.2)

## 2012-02-04 MED ORDER — ISOSORBIDE MONONITRATE ER 60 MG PO TB24
120.0000 mg | ORAL_TABLET | Freq: Every day | ORAL | Status: DC
  Administered 2012-02-04 – 2012-02-05 (×2): 120 mg via ORAL
  Filled 2012-02-04 (×3): qty 2

## 2012-02-04 MED ORDER — CALCIUM ACETATE 667 MG PO CAPS
667.0000 mg | ORAL_CAPSULE | Freq: Three times a day (TID) | ORAL | Status: DC
  Administered 2012-02-05 – 2012-02-14 (×22): 667 mg via ORAL
  Filled 2012-02-04 (×34): qty 1

## 2012-02-04 MED ORDER — INSULIN ASPART 100 UNIT/ML ~~LOC~~ SOLN
0.0000 [IU] | Freq: Three times a day (TID) | SUBCUTANEOUS | Status: DC
  Administered 2012-02-04: 3 [IU] via SUBCUTANEOUS
  Administered 2012-02-05: 2 [IU] via SUBCUTANEOUS

## 2012-02-04 MED ORDER — VANCOMYCIN HCL IN DEXTROSE 1-5 GM/200ML-% IV SOLN
1000.0000 mg | Freq: Once | INTRAVENOUS | Status: AC
  Administered 2012-02-04: 1000 mg via INTRAVENOUS
  Filled 2012-02-04: qty 200

## 2012-02-04 MED ORDER — RISAQUAD PO CAPS
1.0000 | ORAL_CAPSULE | Freq: Two times a day (BID) | ORAL | Status: DC
  Administered 2012-02-05 – 2012-02-14 (×17): 1 via ORAL
  Filled 2012-02-04 (×25): qty 1

## 2012-02-04 MED ORDER — INSULIN ASPART 100 UNIT/ML ~~LOC~~ SOLN
0.0000 [IU] | Freq: Every day | SUBCUTANEOUS | Status: DC
  Administered 2012-02-04: 4 [IU] via SUBCUTANEOUS
  Administered 2012-02-05: 5 [IU] via SUBCUTANEOUS

## 2012-02-04 MED ORDER — LEVOTHYROXINE SODIUM 25 MCG PO TABS
25.0000 ug | ORAL_TABLET | Freq: Every day | ORAL | Status: DC
  Administered 2012-02-04 – 2012-02-05 (×2): 25 ug via ORAL
  Filled 2012-02-04 (×3): qty 1

## 2012-02-04 MED ORDER — PIPERACILLIN-TAZOBACTAM IN DEX 2-0.25 GM/50ML IV SOLN
2.2500 g | Freq: Three times a day (TID) | INTRAVENOUS | Status: DC
  Filled 2012-02-04: qty 50

## 2012-02-04 MED ORDER — CLONIDINE HCL 0.2 MG PO TABS
0.2000 mg | ORAL_TABLET | Freq: Three times a day (TID) | ORAL | Status: DC
  Administered 2012-02-04 – 2012-02-05 (×5): 0.2 mg via ORAL
  Filled 2012-02-04 (×8): qty 1

## 2012-02-04 MED ORDER — OXYCODONE-ACETAMINOPHEN 5-325 MG PO TABS: 1.0000 | ORAL_TABLET | Freq: Four times a day (QID) | ORAL | Status: DC | PRN

## 2012-02-04 MED ORDER — ACETAMINOPHEN 325 MG PO TABS: 650.0000 mg | ORAL_TABLET | Freq: Four times a day (QID) | ORAL | Status: DC | PRN

## 2012-02-04 MED ORDER — PREDNISONE 20 MG PO TABS
40.0000 mg | ORAL_TABLET | Freq: Every day | ORAL | Status: DC
  Administered 2012-02-05: 40 mg via ORAL
  Filled 2012-02-04 (×3): qty 2

## 2012-02-04 MED ORDER — PIPERACILLIN-TAZOBACTAM IN DEX 2-0.25 GM/50ML IV SOLN
2.2500 g | Freq: Three times a day (TID) | INTRAVENOUS | Status: DC
  Administered 2012-02-04 – 2012-02-06 (×5): 2.25 g via INTRAVENOUS
  Filled 2012-02-04 (×8): qty 50

## 2012-02-04 MED ORDER — DEXTROSE 5 % IV SOLN
500.0000 mg | Freq: Once | INTRAVENOUS | Status: AC
  Administered 2012-02-04: 500 mg via INTRAVENOUS
  Filled 2012-02-04: qty 500

## 2012-02-04 MED ORDER — ALBUTEROL SULFATE (5 MG/ML) 0.5% IN NEBU
2.5000 mg | INHALATION_SOLUTION | Freq: Four times a day (QID) | RESPIRATORY_TRACT | Status: DC
  Administered 2012-02-04 – 2012-02-05 (×3): 2.5 mg via RESPIRATORY_TRACT
  Filled 2012-02-04 (×3): qty 0.5

## 2012-02-04 MED ORDER — PIPERACILLIN-TAZOBACTAM IN DEX 2-0.25 GM/50ML IV SOLN
2.2500 g | Freq: Three times a day (TID) | INTRAVENOUS | Status: DC
  Filled 2012-02-04 (×2): qty 50

## 2012-02-04 MED ORDER — IPRATROPIUM BROMIDE 0.02 % IN SOLN
0.5000 mg | Freq: Four times a day (QID) | RESPIRATORY_TRACT | Status: DC
  Administered 2012-02-04 – 2012-02-05 (×3): 0.5 mg via RESPIRATORY_TRACT
  Filled 2012-02-04 (×3): qty 2.5

## 2012-02-04 MED ORDER — VANCOMYCIN HCL 500 MG IV SOLR
500.0000 mg | INTRAVENOUS | Status: DC
  Filled 2012-02-04: qty 500

## 2012-02-04 MED ORDER — ACETAMINOPHEN 650 MG RE SUPP: 650.0000 mg | Freq: Four times a day (QID) | RECTAL | Status: DC | PRN

## 2012-02-04 MED ORDER — SODIUM CHLORIDE 0.9 % IV SOLN: 250.0000 mL | INTRAVENOUS | Status: DC | PRN

## 2012-02-04 MED ORDER — GUAIFENESIN-DM 100-10 MG/5ML PO SYRP
5.0000 mL | ORAL_SOLUTION | ORAL | Status: DC | PRN
  Filled 2012-02-04: qty 5

## 2012-02-04 MED ORDER — INSULIN GLARGINE 100 UNIT/ML ~~LOC~~ SOLN
18.0000 [IU] | Freq: Every day | SUBCUTANEOUS | Status: DC
  Administered 2012-02-04 – 2012-02-05 (×2): 18 [IU] via SUBCUTANEOUS

## 2012-02-04 MED ORDER — PROBIOTIC PO CAPS: ORAL_CAPSULE | Freq: Two times a day (BID) | ORAL | Status: DC

## 2012-02-04 MED ORDER — NITROGLYCERIN 0.4 MG SL SUBL: 0.4000 mg | SUBLINGUAL_TABLET | SUBLINGUAL | Status: DC | PRN

## 2012-02-04 MED ORDER — SODIUM CHLORIDE 0.9 % IJ SOLN: 3.0000 mL | INTRAMUSCULAR | Status: DC | PRN

## 2012-02-04 MED ORDER — ISOSORBIDE MONONITRATE ER 60 MG PO TB24
120.0000 mg | ORAL_TABLET | Freq: Every day | ORAL | Status: DC
Start: 2012-02-04 — End: 2012-02-04

## 2012-02-04 MED ORDER — ONDANSETRON HCL 4 MG/2ML IJ SOLN: 4.0000 mg | Freq: Four times a day (QID) | INTRAMUSCULAR | Status: DC | PRN

## 2012-02-04 MED ORDER — PHYTONADIONE 5 MG PO TABS
2.5000 mg | ORAL_TABLET | ORAL | Status: AC
  Administered 2012-02-04: 2.5 mg via ORAL
  Filled 2012-02-04 (×2): qty 1

## 2012-02-04 MED ORDER — SODIUM CHLORIDE 0.9 % IJ SOLN
3.0000 mL | Freq: Two times a day (BID) | INTRAMUSCULAR | Status: DC
  Administered 2012-02-04 – 2012-02-05 (×3): 3 mL via INTRAVENOUS

## 2012-02-04 MED ORDER — PANTOPRAZOLE SODIUM 40 MG PO TBEC
40.0000 mg | DELAYED_RELEASE_TABLET | Freq: Every day | ORAL | Status: DC
  Administered 2012-02-04 – 2012-02-14 (×11): 40 mg via ORAL
  Filled 2012-02-04 (×9): qty 1

## 2012-02-04 MED ORDER — ONDANSETRON HCL 4 MG PO TABS: 4.0000 mg | ORAL_TABLET | Freq: Four times a day (QID) | ORAL | Status: DC | PRN

## 2012-02-04 MED ORDER — ASPIRIN 81 MG PO CHEW
81.0000 mg | CHEWABLE_TABLET | Freq: Every day | ORAL | Status: DC
  Administered 2012-02-04 – 2012-02-12 (×9): 81 mg via ORAL
  Filled 2012-02-04 (×8): qty 1

## 2012-02-04 MED ORDER — PIPERACILLIN-TAZOBACTAM 3.375 G IVPB 30 MIN
3.3750 g | Freq: Once | INTRAVENOUS | Status: DC
Start: 2012-02-04 — End: 2012-02-04

## 2012-02-04 NOTE — ED Notes (Signed)
In/out cath attempted twice with no urine output.

## 2012-02-04 NOTE — ED Provider Notes (Signed)
History     CSN: [id number]  Arrival date & time 02/04/12  1233   First MD Initiated Contact with Patient 02/04/12 1325      No chief complaint on file.   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 Presented to ER at [facility] on 3/22 w/ 2 day h/o increasing fatigue, 1 day h/o cough w/ purulent sputum accompanied by progressive dyspnea, low grade fever, chills and posterior CP w/ cough.  Diagnosed with RLL PNA and admitted and treated with vanc, ceftaz and azithromycin for HCAP.  Also noted to have uti during that admission.  She was discharged on 01/29/12 (6 da ago).  States she began to feel more fatigued and sob the day after her d/c but improved with dialysis that day.  Over the weekend she felt fine then 2 d ago started feeling more fatigued, increased sob, developed productive cough and low grade temp to 99.7.  Sx's moderate, worse with exertion, relieved with O2 by [place] but has had to increase amount of O2 for comfort.      Past Medical History  Diagnosis Date  . Diabetes mellitus   . Hypercholesterolemia   . Hypothyroidism   . Colitis   . Chronic kidney disease   . Coronary artery disease   . Hypertension   . GERD (gastroesophageal reflux disease)   . Myocardial infarction   . Peripheral vascular disease   . COPD (chronic obstructive pulmonary disease)   . Pneumonia     Past Surgical History  Procedure Date  . Partial hysterectomy   . Tubal ligation   . Amputaton of right fifth toe   . Tonsillectomy   . Insertion of dialysis catheter 10/23/2011    Procedure: INSERTION OF DIALYSIS CATHETER;  Surgeon: Chuck Hint, MD;  Location: [facility] OR;  Service: Vascular;  Laterality: Right;  Right internal jugular Insertion diatek catheter    No family history on file.  History    Substance Use Topics  . Smoking status: Former Smoker -- 1.0 packs/day for 40 years    Types: Cigarettes    Quit date: 06/11/2011  . Smokeless tobacco: Not on file  . Alcohol Use: No    OB History    Grav Para Term Preterm Abortions TAB SAB Ect Mult Living                  Review of Systems  Constitutional: Positive for fever, chills and fatigue.  Respiratory: Positive for cough and shortness of breath.   Cardiovascular: Negative for chest pain.  Gastrointestinal: Negative for nausea, vomiting, abdominal pain and diarrhea.  All other systems reviewed and are negative.    Allergies  Novocain  Home Medications   Current Outpatient Rx  Name Route Sig Dispense Refill  . AMLODIPINE BESYLATE 10 MG PO TABS Oral Take 10 mg by mouth daily.      . ASPIRIN 81 MG PO CHEW Oral Chew 1 tablet (81 mg total) by mouth daily.    Marland Kitchen CALCIUM ACETATE 667 MG PO CAPS Oral Take 1 capsule (667 mg total) by mouth 3 (three) times daily with meals.    Marland Kitchen CLONIDINE HCL 0.2 MG PO TABS Oral Take 0.2 mg by mouth 3 (three) times daily.      . INSULIN GLARGINE 100 UNIT/ML [place] SOLN Subcutaneous Inject 18 Units into the skin at bedtime.  10 mL   . ISOSORBIDE MONONITRATE ER 120 MG PO TB24 Oral Take 120 mg by mouth daily.      Marland Kitchen LEVOTHYROXINE SODIUM 25 MCG PO TABS Oral Take 25 mcg by mouth daily.      Marland Kitchen PANTOPRAZOLE SODIUM 40 MG PO TBEC Oral Take 40 mg by mouth daily.      Marland Kitchen PROBIOTIC PO Oral Take 1 capsule by mouth daily.    Marland Kitchen RANITIDINE HCL 150 MG PO TABS Oral Take 150 mg by mouth 2 (two) times daily.      . WARFARIN SODIUM 2 MG PO TABS Oral Take 2 mg by mouth daily.    Marland Kitchen NITROGLYCERIN 0.4 MG SL SUBL Sublingual Place 0.4 mg under the tongue every 5 (five) minutes as needed. For chest pain.     . OXYCODONE-ACETAMINOPHEN 5-325 MG PO TABS Oral Take 1 tablet by mouth every 6 (six) hours as needed. For pain.      BP 139/92  Pulse 92  Temp(Src) 98.3 F (36.8 C) (Oral)  Resp 18  SpO2 94%  Physical Exam   Nursing note and vitals reviewed. Constitutional: She appears well-developed and well-nourished.  HENT:  Head: Normocephalic and atraumatic.  Eyes: Right eye exhibits no discharge. Left eye exhibits no discharge.  Neck: Normal range of motion. Neck supple.  Cardiovascular: Normal rate, regular rhythm and normal heart sounds.   Pulmonary/Chest: Effort normal. She has decreased breath sounds. She has no wheezes.  Abdominal: Soft. There is no tenderness.  Musculoskeletal: She exhibits no edema and no tenderness.  Neurological: She is alert. GCS eye subscore is 4. GCS verbal subscore is 5. GCS motor subscore is 6.  Skin: Skin is warm and dry.  Psychiatric: She has a normal mood and affect. Her behavior is normal.    ED Course  Procedures (including critical care time)  Labs Reviewed  POCT I-STAT, CHEM 8 - Abnormal; Notable for the following:    Creatinine, Ser 2.30 (*)    Glucose, Bld 246 (*)    Calcium, Ion 1.11 (*)    All other components within normal limits  PROTIME-INR - Abnormal; Notable for the following:    Prothrombin Time 48.5 (*)    INR 5.18 (*)    All other components within normal limits  CBC - Abnormal; Notable for the following:    WBC 14.4 (*)    Hemoglobin 10.8 (*)    HCT 34.7 (*)    RDW 17.3 (*)    All other components within normal limits  DIFFERENTIAL - Abnormal; Notable for the following:    Neutrophils Relative 85 (*)    Neutro Abs 12.3 (*)    Lymphocytes Relative 8 (*)    All other components within normal limits  TROPONIN I  OCCULT BLOOD, POC DEVICE  URINALYSIS, ROUTINE W REFLEX MICROSCOPIC  CULTURE, BLOOD (ROUTINE X 2)  CULTURE, BLOOD (ROUTINE X 2)  LACTIC ACID, PLASMA   Dg Chest 2 View  02/04/2012  *RADIOLOGY REPORT*  Clinical Data: Recent pneumonia  CHEST - 2 VIEW  Comparison: 01/24/2012  Findings: Cardiomediastinal silhouette is stable.  Dual lumen right IJ catheter is unchanged in position.  There is a small right pleural effusion with right  basilar atelectasis or infiltrate.  New patchy airspace disease right upper lobe perihilar suspicious for pneumonia. Left lung is clear.  No pulmonary edema.  IMPRESSION:  There is a small right pleural effusion with right basilar atelectasis or infiltrate.  New patchy airspace disease right upper lobe perihilar  suspicious for pneumonia.  Original Report Authenticated By: Natasha Mead, M.D.     1. Healthcare-associated pneumonia       MDM  Pt with copd on 2L 02 by [place] at baseline, esrd hd mwf, here with cough, fatigue, increased sob, hypoxic to 83%.  Pt admitted last week for pna, treated for hcap, now with new opacity RUL.  Will start hcap abx, vanc, zosyn and azithromycin.  Dr Blake Divine consulted and will admit.  Pt stable.        Elijio Miles, MD 02/04/12 2352

## 2012-02-04 NOTE — ED Notes (Signed)
Pt states was  Admitted to the hospital last week seen t home last Thursday and this past Monday she started to feel bad has Dialysis MWF and son told ems that they took off more fluid today pt is on home o2 at 4 l [place]

## 2012-02-04 NOTE — ED Provider Notes (Addendum)
BP 101/53  Pulse 99  Temp(Src) 98.3 F (36.8 C) (Oral)  Resp 24  SpO2 83%  Medical screening examination done by me.  H/o COPD on oxygen 2L (increasing at home x2 days to maintain O2 sat). ESRD on dialysis, VTE on coumadin pw shortness of breath, fatigue, weakness. Has not urinated since Sunday. Denies CP. 83% on home oxygen. Lungs with good airmovement, no wheezing but pt requesting duoneb. Recent inpatient admission for UTI/HCAP. Completed course of Abx.  Move to main ED for further w/u and evaluation. Possible PE r/o if INR subtherapeutic    Forbes Cellar, MD 02/04/12 1427  Forbes Cellar, MD 02/04/12 1428

## 2012-02-04 NOTE — ED Notes (Signed)
Patients low o2 saturations reported to the nurse.  [name].

## 2012-02-04 NOTE — ED Notes (Signed)
Pt states went to dialysis this am and they took off extra fluid now is feel ing weak states dx w/ pneu and uti last week

## 2012-02-04 NOTE — H&P (Signed)
PCP:   Quentin Mulling, MD, MD   Chief Complaint:  Shortness of breath since this morning.   HPI: 71 year old lady with h/o copd on oxygen at home, who was recently discharged from the hospital 10 days ago after being treated for health care associated pneumonia and UTI. She reports shortness of breath, and worsening cough since 2 days. On arrival to ED , she was found to have oxygen saturations in low 80%'s. She was put on Non rebreather and her oxygen sats improved. 100% oxygen was removed and she was saturating 95% on 3 liters of nasal canula. cxr showed new pneumonia on the right upper lobe and possible atelectasis and pneumonia at the right lower base. She was started on iv antibiotics regimen for HCAP.  Review of Systems:  The patient denies anorexia, weight loss,, vision loss, decreased hearing, hoarseness, chest pain, syncope,  peripheral edema, balance deficits, hemoptysis, abdominal pain, melena, hematochezia, severe indigestion/heartburn, hematuria, incontinence, genital sores, muscle weakness, suspicious skin lesions, transient blindness, difficulty walking, depression, unusual weight change, abnormal bleeding, enlarged lymph nodes, angioedema, and breast masses.  Past Medical History: Past Medical History  Diagnosis Date  . Diabetes mellitus   . Hypercholesterolemia   . Hypothyroidism   . Colitis   . Chronic kidney disease   . Coronary artery disease   . Hypertension   . GERD (gastroesophageal reflux disease)   . Myocardial infarction   . Peripheral vascular disease   . COPD (chronic obstructive pulmonary disease)   . Pneumonia   . Angina   . Shortness of breath    Past Surgical History  Procedure Date  . Partial hysterectomy   . Tubal ligation   . Amputaton of right fifth toe   . Tonsillectomy   . Insertion of dialysis catheter 10/23/2011    Procedure: INSERTION OF DIALYSIS CATHETER;  Surgeon: Chuck Hint, MD;  Location: [facility] OR;  Service: Vascular;   Laterality: Right;  Right internal jugular Insertion diatek catheter    Medications: Prior to Admission medications   Medication Sig Start Date End Date Taking? Authorizing Provider  amLODipine (NORVASC) 10 MG tablet Take 10 mg by mouth daily.     Yes Historical Provider, MD  aspirin 81 MG chewable tablet Chew 1 tablet (81 mg total) by mouth daily. 11/01/11 10/31/12 Yes Clydia Llano, MD  calcium acetate (PHOSLO) 667 MG capsule Take 1 capsule (667 mg total) by mouth 3 (three) times daily with meals. 11/01/11 10/31/12 Yes Clydia Llano, MD  cloNIDine (CATAPRES) 0.2 MG tablet Take 0.2 mg by mouth 3 (three) times daily.     Yes Historical Provider, MD  insulin glargine (LANTUS) 100 UNIT/ML injection Inject 18 Units into the skin at bedtime. 11/01/11  Yes Clydia Llano, MD  isosorbide mononitrate (IMDUR) 120 MG 24 hr tablet Take 120 mg by mouth daily.     Yes Historical Provider, MD  levothyroxine (SYNTHROID, LEVOTHROID) 25 MCG tablet Take 25 mcg by mouth daily.     Yes Historical Provider, MD  pantoprazole (PROTONIX) 40 MG tablet Take 40 mg by mouth daily.     Yes Historical Provider, MD  Probiotic Product (PROBIOTIC PO) Take 1 capsule by mouth daily.   Yes Historical Provider, MD  ranitidine (ZANTAC) 150 MG tablet Take 150 mg by mouth 2 (two) times daily.     Yes Historical Provider, MD  warfarin (COUMADIN) 2 MG tablet Take 2 mg by mouth daily.   Yes Historical Provider, MD  nitroGLYCERIN (NITROSTAT) 0.4 MG SL  tablet Place 0.4 mg under the tongue every 5 (five) minutes as needed. For chest pain.     Historical Provider, MD  oxyCODONE-acetaminophen (PERCOCET) 5-325 MG per tablet Take 1 tablet by mouth every 6 (six) hours as needed. For pain.    Historical Provider, MD    Allergies:   Allergies  Allergen Reactions  . Novocain Other (See Comments)    Unknown    Social History:  reports that she quit smoking about 7 months ago. Her smoking use included Cigarettes. She has a 40 pack-year smoking  history. She does not have any smokeless tobacco history on file. She reports that she does not drink alcohol or use illicit drugs.   Family History: History reviewed. No pertinent family history.  Physical Exam: Filed Vitals:   02/04/12 1233 02/04/12 1452 02/04/12 1617  BP: 101/53 138/54 139/92  Pulse: 99 92 92  Temp: 98.3 F (36.8 C)    TempSrc: Oral    Resp: 24 20 18   SpO2: 83% 94% 94%   Constitutional: Vital signs reviewed.  Patient is a well-developed and well-nourished  in no acute distress and cooperative with exam. Alert and oriented x3.  Head: Normocephalic and atraumatic Mouth: no erythema or exudates, dry MM Eyes: PERRL, EOMI, conjunctivae normal, No scleral icterus.  Neck: Supple, Trachea midline normal ROM, No JVD, mass, thyromegaly, or carotid bruit present.  Cardiovascular: RRR, S1 normal, S2 normal, no MRG, pulses symmetric and intact bilaterally Pulmonary/Chest: scattered wheezing and rhonchi  on the right side. Abdominal: Soft. Non-tender, non-distended, bowel sounds are normal, no masses, organomegaly, or guarding present.  Musculoskeletal: No joint deformities, erythema, or stiffness, ROM full and no nontender Neurological: A&O x3, Strenght is normal and symmetric bilaterally, cranial nerve II-XII are grossly intact, no focal motor deficit, sensory intact to light touch bilaterally.  Psychiatric: Normal mood and affect. speech and behavior is normal.      Labs on Admission:   Basename 02/04/12 1406  NA 136  K 3.7  CL 96  CO2 --  GLUCOSE 246*  BUN 10  CREATININE 2.30*  CALCIUM --  MG --  PHOS --   No results found for this basename: AST:2,ALT:2,ALKPHOS:2,BILITOT:2,PROT:2,ALBUMIN:2 in the last 72 hours No results found for this basename: LIPASE:2,AMYLASE:2 in the last 72 hours  Basename 02/04/12 1515 02/04/12 1406  WBC 14.4* --  NEUTROABS 12.3* --  HGB 10.8* 12.2  HCT 34.7* 36.0  MCV 86.8 --  PLT 379 --    Basename 02/04/12 1448  CKTOTAL --   CKMB --  CKMBINDEX --  TROPONINI <0.30   No results found for this basename: TSH,T4TOTAL,FREET3,T3FREE,THYROIDAB in the last 72 hours No results found for this basename: VITAMINB12:2,FOLATE:2,FERRITIN:2,TIBC:2,IRON:2,RETICCTPCT:2 in the last 72 hours  Radiological Exams on Admission: Dg Chest 2 View  02/04/2012  *RADIOLOGY REPORT*  Clinical Data: Recent pneumonia  CHEST - 2 VIEW  Comparison: 01/24/2012  Findings: Cardiomediastinal silhouette is stable.  Dual lumen right IJ catheter is unchanged in position.  There is a small right pleural effusion with right basilar atelectasis or infiltrate.  New patchy airspace disease right upper lobe perihilar suspicious for pneumonia. Left lung is clear.  No pulmonary edema.  IMPRESSION:  There is a small right pleural effusion with right basilar atelectasis or infiltrate.  New patchy airspace disease right upper lobe perihilar suspicious for pneumonia.  Original Report Authenticated By: Natasha Mead, M.D.   Portable Chest Xray In Am  01/24/2012  *RADIOLOGY REPORT*  Clinical Data: Pneumonia  PORTABLE CHEST -  1 VIEW  Comparison: 01/23/2012; 10/23/2011; chest CT - 10/22/2011  Findings: Unchanged cardiac silhouette and mediastinal contours. Stable positioning of support apparatus.  Grossly unchanged right mid and lower lung heterogeneous air space opacities.  Grossly unchanged left basilar/retrocardiac heterogeneous opacities.  The pulmonary vasculature remains indistinct.  Persistent blunting of the right costophrenic angle suggesting a small right-sided pleural effusion. No pneumothorax.  Unchanged bones.  IMPRESSION: 1.  Grossly unchanged right mid and lower lung heterogeneous air space opacity worrisome for infection. 2.  Grossly unchanged left basilar/retrocardiac opacity, possibly atelectasis. 3.  Unchanged findings of mild pulmonary edema and small right- sided effusion.  Original Report Authenticated By: Waynard Reeds, M.D.   Portable Chest Xray  01/23/2012   *RADIOLOGY REPORT*  Clinical Data: Increased shortness of breath  PORTABLE CHEST - 1 VIEW  Comparison: Portable chest x-ray of 01/23/2012  Findings: There has been some improvement in airspace disease at the right lung base and in the right mid lung.  A small right effusion remains.  Mild cardiomegaly is stable.  There is still evidence of mild pulmonary vascular congestion.  Right central venous line is unchanged in position.  IMPRESSION: Some improvement in airspace disease.  Persistent cardiomegaly, right effusion, and mild pulmonary vascular congestion.  Original Report Authenticated By: Juline Patch, M.D.    Assessment/Plan Present on Admission:  1.Acute on Chronic respiratory failure: secondary to  Health Care associated Pneumonia with a component of copd exacerbation.   Admit the pt to telemetry. Start the pt on iv vancomycin, zosyn, and azithromycin.  Will repeat cxr in 1 to 2 days.  Continue with nasal oxygen.  Albuterol and atrovent nebs as needed. Will start the patient on low dose steroids with a quick taper.  Continue with robitussin.  PT evaluation.   2. DM continue with lantus and SSI.  3. Hypertension: controlled.  4. Coagulopathy: supratherapeutic INR. She was found to be guiac positive. Will monitor H&H q12hr. No other indications of bleeding. Will give her a low dose vitamin K and repeat inr in am.  5. Low grade temp and leukocytosis: probably secondary to HCAP.  5. ESRD on HD: will call renal consult for initiation of HD.  6. dvt prophylaxis: scd's.   After discussion with the patient, she  is to be a full code.  We will respect these wishes.    Time spent on this patient including examination and decision-making process: 62 minutes.  Jezreel Justiniano [phone] 02/04/2012, 4:36 PM

## 2012-02-04 NOTE — Progress Notes (Signed)
02/04/2012 patient came from the emergency room to 6700, she is alert and oriented up with assist. Patient stated she have some weakness. She is on 4 Liters of oxygen and Rn was told patient was oxygen dependent at home. Patient is  A renal patient have a right upper arm fistula and right hemodialysis catheter. Patient bruises on bilateral upper and lower arms. She have two small scabs on right  Upper arm , one is noted on the elbow. Old bruise noted on left knee. Small dark bruise on left breast. Two scab on left lower leg and scab on right lower leg. Scab noted on left foot great toe and third toe. 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 Heels dry and pinkish. Biochemist, clinical.

## 2012-02-04 NOTE — ED Notes (Signed)
[id number] Ready

## 2012-02-04 NOTE — Progress Notes (Signed)
ANTIBIOTIC CONSULT NOTE - INITIAL  Pharmacy Consult for vancomycin/zosyn Indication: HCAP  Allergies  Allergen Reactions  . Novocain Other (See Comments)    Unknown    Patient Measurements:   Adjusted Body Weight: 59kg  Vital Signs: Temp: 98.3 F (36.8 C) (04/03 1233) Temp src: Oral (04/03 1233) BP: 139/92 mmHg (04/03 1617) Pulse Rate: 92  (04/03 1617) Intake/Output from previous day:   Intake/Output from this shift: No UOP since 02/01/12  Labs:  [facility] 02/04/12 1515 02/04/12 1406  WBC 14.4* --  HGB 10.8* 12.2  PLT 379 --  LABCREA -- --  CREATININE -- 2.30*   The CrCl is unknown because both a height and weight (above a minimum accepted value) are required for this calculation. No results found for this basename: VANCOTROUGH:2,VANCOPEAK:2,VANCORANDOM:2,GENTTROUGH:2,GENTPEAK:2,GENTRANDOM:2,TOBRATROUGH:2,TOBRAPEAK:2,TOBRARND:2,AMIKACINPEAK:2,AMIKACINTROU:2,AMIKACIN:2, in the last 72 hours   Microbiology: Recent Results (from the past 720 hour(s))  MRSA PCR SCREENING     Status: Normal   Collection Time   01/23/12 10:11 AM      Component Value Range Status Comment   MRSA by PCR NEGATIVE  NEGATIVE  Final   URINE CULTURE     Status: Normal   Collection Time   01/23/12 10:45 AM      Component Value Range Status Comment   Specimen Description URINE, RANDOM   Final    Special Requests NONE   Final    Culture  Setup Time [id number]   Final    Colony Count >=100,000 COLONIES/ML   Final    Culture     Final    Value: ESCHERICHIA COLI     Note: Confirmed Extended Spectrum Beta-Lactamase Producer (ESBL)   Report Status 01/26/2012 FINAL   Final    Organism ID, Bacteria ESCHERICHIA COLI   Final   CULTURE, BLOOD (ROUTINE X 2)     Status: Normal   Collection Time   01/23/12 11:31 AM      Component Value Range Status Comment   Specimen Description BLOOD LEFT ANTECUBITAL   Final    Special Requests BOTTLES DRAWN AEROBIC ONLY 10CC   Final    Culture  Setup Time  [id number]   Final    Culture NO GROWTH 5 DAYS   Final    Report Status 01/29/2012 FINAL   Final   CULTURE, BLOOD (ROUTINE X 2)     Status: Normal   Collection Time   01/23/12 12:09 PM      Component Value Range Status Comment   Specimen Description BLOOD LEFT ANTECUBITAL   Final    Special Requests BOTTLES DRAWN AEROBIC ONLY [facility]   Final    Culture  Setup Time [id number]   Final    Culture NO GROWTH 5 DAYS   Final    Report Status 01/29/2012 FINAL   Final     Medical History: Past Medical History  Diagnosis Date  . Diabetes mellitus   . Hypercholesterolemia   . Hypothyroidism   . Colitis   . Chronic kidney disease   . Coronary artery disease   . Hypertension   . GERD (gastroesophageal reflux disease)   . Myocardial infarction   . Peripheral vascular disease   . COPD (chronic obstructive pulmonary disease)   . Pneumonia   . Angina   . Shortness of breath     Medications:  Prior to Admission medications   Medication Sig Start Date End Date Taking? Authorizing Provider  amLODipine (NORVASC) 10 MG tablet Take 10 mg by mouth daily.     Yes  Historical Provider, MD  aspirin 81 MG chewable tablet Chew 1 tablet (81 mg total) by mouth daily. 11/01/11 10/31/12 Yes Clydia Llano, MD  calcium acetate (PHOSLO) 667 MG capsule Take 1 capsule (667 mg total) by mouth 3 (three) times daily with meals. 11/01/11 10/31/12 Yes Clydia Llano, MD  cloNIDine (CATAPRES) 0.2 MG tablet Take 0.2 mg by mouth 3 (three) times daily.     Yes Historical Provider, MD  insulin glargine (LANTUS) 100 UNIT/ML injection Inject 18 Units into the skin at bedtime. 11/01/11  Yes Clydia Llano, MD  isosorbide mononitrate (IMDUR) 120 MG 24 hr tablet Take 120 mg by mouth daily.     Yes Historical Provider, MD  levothyroxine (SYNTHROID, LEVOTHROID) 25 MCG tablet Take 25 mcg by mouth daily.     Yes Historical Provider, MD  pantoprazole (PROTONIX) 40 MG tablet Take 40 mg by mouth daily.     Yes Historical Provider, MD    Probiotic Product (PROBIOTIC PO) Take 1 capsule by mouth daily.   Yes Historical Provider, MD  ranitidine (ZANTAC) 150 MG tablet Take 150 mg by mouth 2 (two) times daily.     Yes Historical Provider, MD  warfarin (COUMADIN) 2 MG tablet Take 2 mg by mouth daily.   Yes Historical Provider, MD  nitroGLYCERIN (NITROSTAT) 0.4 MG SL tablet Place 0.4 mg under the tongue every 5 (five) minutes as needed. For chest pain.     Historical Provider, MD  oxyCODONE-acetaminophen (PERCOCET) 5-325 MG per tablet Take 1 tablet by mouth every 6 (six) hours as needed. For pain.    Historical Provider, MD    Assessment/Plan 71 year old ESRD patient well known to pharmacy service for previous anticoagulation and antibiotic dosing.   HCAP: was treated with multiple antibiotics last admit including vancomycin, fortaz, azith, meropenem, and levaquin. Patient did up growing ESBL E.coli in urine cx 3/22.  This culture is sensitive to zosyn with MIC of 8, could consider resuming meropenem if there is concern that this was not adequately treated. Per d/c summary she did finish her course of abx (7d total). Will load patient with vancomycin today and give subsequent doses after HD MWF checking trough at steady state likely early next week.  1.Vancomycin load of 1000mg  total 2.Zosyn 3.375 x1 then 2.25g q8 hours 3.Follow culture data    Hx dvt/PE: INR supratherapeutic on admission at 5.1. It appears that patient finished antibiotic course prior to discharge last month, so that is ruled out as cause for elevated INR. Low dose po vitamin k ordered to reverse INR. Will continue to follow peripherally for now. 1. Vitamin k 2.5mg  x 1   Goal of Therapy:  Pre-HD vancomycin level 15-25  Severiano Gilbert 02/04/2012,4:47 PM

## 2012-02-05 LAB — APTT: aPTT: 129 seconds — ABNORMAL HIGH (ref 24–37)

## 2012-02-05 LAB — PROTIME-INR
INR: 4.69 — ABNORMAL HIGH (ref 0.00–1.49)
Prothrombin Time: 44.8 seconds — ABNORMAL HIGH (ref 11.6–15.2)

## 2012-02-05 LAB — COMPREHENSIVE METABOLIC PANEL
Alkaline Phosphatase: 115 U/L (ref 39–117)
CO2: 30 mEq/L (ref 19–32)
Calcium: 8.5 mg/dL (ref 8.4–10.5)
Creatinine, Ser: 3.36 mg/dL — ABNORMAL HIGH (ref 0.50–1.10)
Glucose, Bld: 245 mg/dL — ABNORMAL HIGH (ref 70–99)
Sodium: 132 mEq/L — ABNORMAL LOW (ref 135–145)
Total Bilirubin: 0.2 mg/dL — ABNORMAL LOW (ref 0.3–1.2)
Total Protein: 6.5 g/dL (ref 6.0–8.3)

## 2012-02-05 LAB — CBC
MCH: 27.2 pg (ref 26.0–34.0)
MCHC: 31 g/dL (ref 30.0–36.0)
MCV: 87.7 fL (ref 78.0–100.0)
Platelets: 363 10*3/uL (ref 150–400)
RDW: 17.6 % — ABNORMAL HIGH (ref 11.5–15.5)
WBC: 12.3 10*3/uL — ABNORMAL HIGH (ref 4.0–10.5)

## 2012-02-05 LAB — HEMOGLOBIN AND HEMATOCRIT, BLOOD: HCT: 32.1 % — ABNORMAL LOW (ref 36.0–46.0)

## 2012-02-05 LAB — CARDIAC PANEL(CRET KIN+CKTOT+MB+TROPI)
CK, MB: 1.1 ng/mL (ref 0.3–4.0)
Relative Index: INVALID (ref 0.0–2.5)
Total CK: 17 U/L (ref 7–177)
Troponin I: <0.3 ng/mL (ref <0.30)
Troponin I: <0.3 ng/mL (ref <0.30)

## 2012-02-05 LAB — GLUCOSE, CAPILLARY: Glucose-Capillary: 565 mg/dL (ref 70–99)

## 2012-02-05 MED ORDER — CALCIUM CARBONATE 1250 MG/5ML PO SUSP
500.0000 mg | Freq: Four times a day (QID) | ORAL | Status: DC | PRN
  Administered 2012-02-05 – 2012-02-09 (×2): 500 mg via ORAL
  Filled 2012-02-05 (×3): qty 5

## 2012-02-05 MED ORDER — SODIUM CHLORIDE 0.9 % IJ SOLN
3.0000 mL | INTRAMUSCULAR | Status: DC | PRN
  Administered 2012-02-06: 3 mL via INTRAVENOUS

## 2012-02-05 MED ORDER — PHYTONADIONE 5 MG PO TABS
5.0000 mg | ORAL_TABLET | Freq: Once | ORAL | Status: AC
  Administered 2012-02-05: 5 mg via ORAL
  Filled 2012-02-05: qty 1

## 2012-02-05 MED ORDER — CAMPHOR-MENTHOL 0.5-0.5 % EX LOTN
1.0000 "application " | TOPICAL_LOTION | Freq: Three times a day (TID) | CUTANEOUS | Status: DC | PRN
  Filled 2012-02-05: qty 222

## 2012-02-05 MED ORDER — ACETAMINOPHEN 325 MG PO TABS: 650.0000 mg | ORAL_TABLET | Freq: Four times a day (QID) | ORAL | Status: DC | PRN

## 2012-02-05 MED ORDER — INSULIN ASPART 100 UNIT/ML ~~LOC~~ SOLN
20.0000 [IU] | Freq: Once | SUBCUTANEOUS | Status: AC
  Administered 2012-02-05: 20 [IU] via SUBCUTANEOUS

## 2012-02-05 MED ORDER — IPRATROPIUM BROMIDE 0.02 % IN SOLN
0.5000 mg | Freq: Three times a day (TID) | RESPIRATORY_TRACT | Status: DC
  Administered 2012-02-05 (×2): 0.5 mg via RESPIRATORY_TRACT
  Filled 2012-02-05 (×3): qty 2.5

## 2012-02-05 MED ORDER — SODIUM CHLORIDE 0.9 % IV SOLN
62.5000 mg | INTRAVENOUS | Status: DC
  Administered 2012-02-06: 62.5 mg via INTRAVENOUS
  Filled 2012-02-05 (×2): qty 5

## 2012-02-05 MED ORDER — ACETAMINOPHEN 650 MG RE SUPP: 650.0000 mg | Freq: Four times a day (QID) | RECTAL | Status: DC | PRN

## 2012-02-05 MED ORDER — SODIUM CHLORIDE 0.9 % IV SOLN: 250.0000 mL | INTRAVENOUS | Status: DC | PRN

## 2012-02-05 MED ORDER — ONDANSETRON HCL 4 MG/2ML IJ SOLN
4.0000 mg | Freq: Four times a day (QID) | INTRAMUSCULAR | Status: DC | PRN
  Administered 2012-02-13: 4 mg via INTRAVENOUS
  Filled 2012-02-05: qty 2

## 2012-02-05 MED ORDER — POTASSIUM CHLORIDE CRYS ER 20 MEQ PO TBCR
30.0000 meq | EXTENDED_RELEASE_TABLET | Freq: Once | ORAL | Status: AC
  Administered 2012-02-05: 30 meq via ORAL
  Filled 2012-02-05: qty 1

## 2012-02-05 MED ORDER — DOCUSATE SODIUM 283 MG RE ENEM
1.0000 | ENEMA | RECTAL | Status: DC | PRN
  Filled 2012-02-05: qty 1

## 2012-02-05 MED ORDER — ONDANSETRON HCL 4 MG PO TABS
4.0000 mg | ORAL_TABLET | Freq: Four times a day (QID) | ORAL | Status: DC | PRN
  Administered 2012-02-09 – 2012-02-12 (×3): 4 mg via ORAL
  Filled 2012-02-05 (×3): qty 1

## 2012-02-05 MED ORDER — SODIUM CHLORIDE 0.9 % IJ SOLN
3.0000 mL | Freq: Two times a day (BID) | INTRAMUSCULAR | Status: DC
  Administered 2012-02-06 – 2012-02-13 (×13): 3 mL via INTRAVENOUS

## 2012-02-05 MED ORDER — INSULIN ASPART 100 UNIT/ML ~~LOC~~ SOLN
5.0000 [IU] | Freq: Three times a day (TID) | SUBCUTANEOUS | Status: DC
  Administered 2012-02-05: 5 [IU] via SUBCUTANEOUS

## 2012-02-05 MED ORDER — PARICALCITOL 5 MCG/ML IV SOLN
1.0000 ug | INTRAVENOUS | Status: DC
  Administered 2012-02-06 – 2012-02-09 (×2): 1 ug via INTRAVENOUS
  Filled 2012-02-05 (×3): qty 0.2

## 2012-02-05 MED ORDER — NEPRO/CARBSTEADY PO LIQD
237.0000 mL | Freq: Three times a day (TID) | ORAL | Status: DC | PRN
  Filled 2012-02-05: qty 237

## 2012-02-05 MED ORDER — ALBUTEROL SULFATE (5 MG/ML) 0.5% IN NEBU
2.5000 mg | INHALATION_SOLUTION | Freq: Three times a day (TID) | RESPIRATORY_TRACT | Status: DC
  Administered 2012-02-05 (×2): 2.5 mg via RESPIRATORY_TRACT
  Filled 2012-02-05 (×2): qty 0.5

## 2012-02-05 MED ORDER — DARBEPOETIN ALFA-POLYSORBATE 100 MCG/0.5ML IJ SOLN: 100.0000 ug | INTRAMUSCULAR | Status: DC

## 2012-02-05 MED ORDER — ZOLPIDEM TARTRATE 5 MG PO TABS: 5.0000 mg | ORAL_TABLET | Freq: Every evening | ORAL | Status: DC | PRN

## 2012-02-05 MED ORDER — INSULIN ASPART 100 UNIT/ML ~~LOC~~ SOLN
0.0000 [IU] | Freq: Three times a day (TID) | SUBCUTANEOUS | Status: DC
  Administered 2012-02-05 (×2): 20 [IU] via SUBCUTANEOUS

## 2012-02-05 MED ORDER — HYDROXYZINE HCL 25 MG PO TABS
25.0000 mg | ORAL_TABLET | Freq: Three times a day (TID) | ORAL | Status: DC | PRN
  Filled 2012-02-05: qty 1

## 2012-02-05 MED ORDER — SORBITOL 70 % SOLN
30.0000 mL | Status: DC | PRN
  Filled 2012-02-05: qty 30

## 2012-02-05 MED ORDER — DEXTROSE 5 % IV SOLN
500.0000 mg | INTRAVENOUS | Status: DC
  Administered 2012-02-05: 500 mg via INTRAVENOUS
  Filled 2012-02-05 (×2): qty 500

## 2012-02-05 NOTE — Progress Notes (Signed)
   CARE MANAGEMENT NOTE 02/05/2012  Patient:  Insalaco,Kalynne   Account Number:  [id number]  Date Initiated:  02/05/2012  Documentation initiated by:  Letha Cape  Subjective/Objective Assessment:   dx pna, emphysema  admit- lives with spouse, has home oxygen with [employer] and [facility] with [facility].     Action/Plan:   Resume HHRN with [facility]   Anticipated DC Date:  02/08/2012   Anticipated DC Plan:  HOME W HOME HEALTH SERVICES      DC Planning Services  CM consult      [facility] Choice  Resumption Of Svcs/PTA Provider   Choice offered to / List presented to:  C-1 Patient        HH arranged  HH-1 RN      [facility] agency  [facility] HEALTH   Status of service:  In process, will continue to follow Medicare Important Message given?   (If response is "NO", the following Medicare IM given date fields will be blank) Date Medicare IM given:   Date Additional Medicare IM given:    Discharge Disposition:    Per UR Regulation:    If discussed at Long Length of Stay Meetings, dates discussed:    Comments:  PCP Hopper  02/05/12 11:36 Letha Cape RN, BSN [phone] patient lives with spouse, has home oxygen with [employer], active with [facility] for [facility], will need to resume HHRN.   Patient would like to continue with [facility].

## 2012-02-05 NOTE — Progress Notes (Signed)
Subjective: Improved breathing. Sitting up on bed. Reports having a good nite sleep.   Objective: Weight change:   Intake/Output Summary (Last 24 hours) at 02/05/12 1359 Last data filed at 02/05/12 0927  Gross per 24 hour  Intake    480 ml  Output    100 ml  Net    380 ml    Filed Vitals:   02/05/12 0608  BP: 109/61  Pulse: 86  Temp: 98.3 F (36.8 C)  Resp: 18   On exam  She is alert afebrile comfortable CVS S1S2 heard Lungs good air entry bilateral. No wheezing Abdomen: soft non tender non distended bowel sounds heard Extremities: no pedal edema.   Lab Results: Results for orders placed during the hospital encounter of 02/04/12 (from the past 24 hour(s))  POCT I-STAT, CHEM 8     Status: Abnormal   Collection Time   02/04/12  2:06 PM      Component Value Range   Sodium 136  135 - 145 (mEq/L)   Potassium 3.7  3.5 - 5.1 (mEq/L)   Chloride 96  96 - 112 (mEq/L)   BUN 10  6 - 23 (mg/dL)   Creatinine, Ser 3.38 (*) 0.50 - 1.10 (mg/dL)   Glucose, Bld 250 (*) 70 - 99 (mg/dL)   Calcium, Ion 5.39 (*) 1.12 - 1.32 (mmol/L)   TCO2 32  0 - 100 (mmol/L)   Hemoglobin 12.2  12.0 - 15.0 (g/dL)   HCT 76.7  34.1 - 93.7 (%)  TROPONIN I     Status: Normal   Collection Time   02/04/12  2:48 PM      Component Value Range   Troponin I <0.30  <0.30 (ng/mL)  PROTIME-INR     Status: Abnormal   Collection Time   02/04/12  3:00 PM      Component Value Range   Prothrombin Time 48.5 (*) 11.6 - 15.2 (seconds)   INR 5.18 (*) 0.00 - 1.49   OCCULT BLOOD, POC DEVICE     Status: Normal   Collection Time   02/04/12  3:14 PM      Component Value Range   Fecal Occult Bld POSITIVE    CBC     Status: Abnormal   Collection Time   02/04/12  3:15 PM      Component Value Range   WBC 14.4 (*) 4.0 - 10.5 (K/uL)   RBC 4.00  3.87 - 5.11 (MIL/uL)   Hemoglobin 10.8 (*) 12.0 - 15.0 (g/dL)   HCT 90.2 (*) 40.9 - 46.0 (%)   MCV 86.8  78.0 - 100.0 (fL)   MCH 27.0  26.0 - 34.0 (pg)   MCHC 31.1  30.0 - 36.0 (g/dL)     RDW 73.5 (*) 32.9 - 15.5 (%)   Platelets 379  150 - 400 (K/uL)  DIFFERENTIAL     Status: Abnormal   Collection Time   02/04/12  3:15 PM      Component Value Range   Neutrophils Relative 85 (*) 43 - 77 (%)   Neutro Abs 12.3 (*) 1.7 - 7.7 (K/uL)   Lymphocytes Relative 8 (*) 12 - 46 (%)   Lymphs Abs 1.2  0.7 - 4.0 (K/uL)   Monocytes Relative 6  3 - 12 (%)   Monocytes Absolute 0.8  0.1 - 1.0 (K/uL)   Eosinophils Relative 0  0 - 5 (%)   Eosinophils Absolute 0.0  0.0 - 0.7 (K/uL)   Basophils Relative 1  0 - 1 (%)  Basophils Absolute 0.1  0.0 - 0.1 (K/uL)  LACTIC ACID, PLASMA     Status: Normal   Collection Time   02/04/12  3:28 PM      Component Value Range   Lactic Acid, Venous 1.3  0.5 - 2.2 (mmol/L)  CULTURE, BLOOD (ROUTINE X 2)     Status: Normal (Preliminary result)   Collection Time   02/04/12  3:40 PM      Component Value Range   Specimen Description BLOOD FINGER LEFT     Special Requests BOTTLES DRAWN AEROBIC ONLY 4CC     Culture  Setup Time [id number]     Culture       Value:        BLOOD CULTURE RECEIVED NO GROWTH TO DATE CULTURE WILL BE HELD FOR 5 DAYS BEFORE ISSUING A FINAL NEGATIVE REPORT   Report Status PENDING    CULTURE, BLOOD (ROUTINE X 2)     Status: Normal (Preliminary result)   Collection Time   02/04/12  4:00 PM      Component Value Range   Specimen Description BLOOD ARM LEFT     Special Requests BOTTLES DRAWN AEROBIC ONLY 8CC     Culture  Setup Time [id number]     Culture       Value:        BLOOD CULTURE RECEIVED NO GROWTH TO DATE CULTURE WILL BE HELD FOR 5 DAYS BEFORE ISSUING A FINAL NEGATIVE REPORT   Report Status PENDING    GLUCOSE, CAPILLARY     Status: Abnormal   Collection Time   02/04/12  5:32 PM      Component Value Range   Glucose-Capillary 245 (*) 70 - 99 (mg/dL)  CARDIAC PANEL(CRET KIN+CKTOT+MB+TROPI)     Status: Normal   Collection Time   02/04/12  7:33 PM      Component Value Range   Total CK 23  7 - 177 (U/L)   CK, MB 0.9  0.3 - 4.0  (ng/mL)   Troponin I <0.30  <0.30 (ng/mL)   Relative Index RELATIVE INDEX IS INVALID  0.0 - 2.5   HEMOGLOBIN AND HEMATOCRIT, BLOOD     Status: Abnormal   Collection Time   02/04/12  7:34 PM      Component Value Range   Hemoglobin 9.5 (*) 12.0 - 15.0 (g/dL)   HCT 86.5 (*) 78.4 - 46.0 (%)  GLUCOSE, CAPILLARY     Status: Abnormal   Collection Time   02/04/12  8:26 PM      Component Value Range   Glucose-Capillary 334 (*) 70 - 99 (mg/dL)  CARDIAC PANEL(CRET KIN+CKTOT+MB+TROPI)     Status: Normal   Collection Time   02/05/12  2:56 AM      Component Value Range   Total CK 17  7 - 177 (U/L)   CK, MB 1.1  0.3 - 4.0 (ng/mL)   Troponin I <0.30  <0.30 (ng/mL)   Relative Index RELATIVE INDEX IS INVALID  0.0 - 2.5   COMPREHENSIVE METABOLIC PANEL     Status: Abnormal   Collection Time   02/05/12  3:00 AM      Component Value Range   Sodium 132 (*) 135 - 145 (mEq/L)   Potassium 3.2 (*) 3.5 - 5.1 (mEq/L)   Chloride 92 (*) 96 - 112 (mEq/L)   CO2 30  19 - 32 (mEq/L)   Glucose, Bld 245 (*) 70 - 99 (mg/dL)   BUN 19  6 - 23 (mg/dL)  Creatinine, Ser 3.36 (*) 0.50 - 1.10 (mg/dL)   Calcium 8.5  8.4 - 16.1 (mg/dL)   Total Protein 6.5  6.0 - 8.3 (g/dL)   Albumin 2.4 (*) 3.5 - 5.2 (g/dL)   AST 13  0 - 37 (U/L)   ALT 11  0 - 35 (U/L)   Alkaline Phosphatase 115  39 - 117 (U/L)   Total Bilirubin 0.2 (*) 0.3 - 1.2 (mg/dL)   GFR calc non Af Amer 13 (*) >90 (mL/min)   GFR calc Af Amer 15 (*) >90 (mL/min)  CBC     Status: Abnormal   Collection Time   02/05/12  3:00 AM      Component Value Range   WBC 12.3 (*) 4.0 - 10.5 (K/uL)   RBC 3.49 (*) 3.87 - 5.11 (MIL/uL)   Hemoglobin 9.5 (*) 12.0 - 15.0 (g/dL)   HCT 09.6 (*) 04.5 - 46.0 (%)   MCV 87.7  78.0 - 100.0 (fL)   MCH 27.2  26.0 - 34.0 (pg)   MCHC 31.0  30.0 - 36.0 (g/dL)   RDW 40.9 (*) 81.1 - 15.5 (%)   Platelets 363  150 - 400 (K/uL)  PROTIME-INR     Status: Abnormal   Collection Time   02/05/12  3:00 AM      Component Value Range   Prothrombin Time  51.5 (*) 11.6 - 15.2 (seconds)   INR 5.60 (*) 0.00 - 1.49   APTT     Status: Abnormal   Collection Time   02/05/12  3:00 AM      Component Value Range   aPTT 129 (*) 24 - 37 (seconds)  GLUCOSE, CAPILLARY     Status: Abnormal   Collection Time   02/05/12  7:41 AM      Component Value Range   Glucose-Capillary 188 (*) 70 - 99 (mg/dL)  CARDIAC PANEL(CRET KIN+CKTOT+MB+TROPI)     Status: Normal   Collection Time   02/05/12  9:40 AM      Component Value Range   Total CK 20  7 - 177 (U/L)   CK, MB 0.7  0.3 - 4.0 (ng/mL)   Troponin I <0.30  <0.30 (ng/mL)   Relative Index RELATIVE INDEX IS INVALID  0.0 - 2.5   PROTIME-INR     Status: Abnormal   Collection Time   02/05/12  9:40 AM      Component Value Range   Prothrombin Time 44.8 (*) 11.6 - 15.2 (seconds)   INR 4.69 (*) 0.00 - 1.49   GLUCOSE, CAPILLARY     Status: Abnormal   Collection Time   02/05/12 11:57 AM      Component Value Range   Glucose-Capillary 475 (*) 70 - 99 (mg/dL)     Micro Results: Recent Results (from the past 240 hour(s))  CULTURE, BLOOD (ROUTINE X 2)     Status: Normal (Preliminary result)   Collection Time   02/04/12  3:40 PM      Component Value Range Status Comment   Specimen Description BLOOD FINGER LEFT   Final    Special Requests BOTTLES DRAWN AEROBIC ONLY 4CC   Final    Culture  Setup Time [id number]   Final    Culture     Final    Value:        BLOOD CULTURE RECEIVED NO GROWTH TO DATE CULTURE WILL BE HELD FOR 5 DAYS BEFORE ISSUING A FINAL NEGATIVE REPORT   Report Status PENDING   Incomplete   CULTURE,  BLOOD (ROUTINE X 2)     Status: Normal (Preliminary result)   Collection Time   02/04/12  4:00 PM      Component Value Range Status Comment   Specimen Description BLOOD ARM LEFT   Final    Special Requests BOTTLES DRAWN AEROBIC ONLY 8CC   Final    Culture  Setup Time [id number]   Final    Culture     Final    Value:        BLOOD CULTURE RECEIVED NO GROWTH TO DATE CULTURE WILL BE HELD FOR 5 DAYS BEFORE  ISSUING A FINAL NEGATIVE REPORT   Report Status PENDING   Incomplete     Studies/Results: Dg Chest 2 View  02/04/2012  *RADIOLOGY REPORT*  Clinical Data: Recent pneumonia  CHEST - 2 VIEW  Comparison: 01/24/2012  Findings: Cardiomediastinal silhouette is stable.  Dual lumen right IJ catheter is unchanged in position.  There is a small right pleural effusion with right basilar atelectasis or infiltrate.  New patchy airspace disease right upper lobe perihilar suspicious for pneumonia. Left lung is clear.  No pulmonary edema.  IMPRESSION:  There is a small right pleural effusion with right basilar atelectasis or infiltrate.  New patchy airspace disease right upper lobe perihilar suspicious for pneumonia.  Original Report Authenticated By: Natasha Mead, M.D.   Portable Chest Xray In Am  01/24/2012  *RADIOLOGY REPORT*  Clinical Data: Pneumonia  PORTABLE CHEST - 1 VIEW  Comparison: 01/23/2012; 10/23/2011; chest CT - 10/22/2011  Findings: Unchanged cardiac silhouette and mediastinal contours. Stable positioning of support apparatus.  Grossly unchanged right mid and lower lung heterogeneous air space opacities.  Grossly unchanged left basilar/retrocardiac heterogeneous opacities.  The pulmonary vasculature remains indistinct.  Persistent blunting of the right costophrenic angle suggesting a small right-sided pleural effusion. No pneumothorax.  Unchanged bones.  IMPRESSION: 1.  Grossly unchanged right mid and lower lung heterogeneous air space opacity worrisome for infection. 2.  Grossly unchanged left basilar/retrocardiac opacity, possibly atelectasis. 3.  Unchanged findings of mild pulmonary edema and small right- sided effusion.  Original Report Authenticated By: Waynard Reeds, M.D.   Portable Chest Xray  01/23/2012  *RADIOLOGY REPORT*  Clinical Data: Increased shortness of breath  PORTABLE CHEST - 1 VIEW  Comparison: Portable chest x-ray of 01/23/2012  Findings: There has been some improvement in airspace  disease at the right lung base and in the right mid lung.  A small right effusion remains.  Mild cardiomegaly is stable.  There is still evidence of mild pulmonary vascular congestion.  Right central venous line is unchanged in position.  IMPRESSION: Some improvement in airspace disease.  Persistent cardiomegaly, right effusion, and mild pulmonary vascular congestion.  Original Report Authenticated By: Juline Patch, M.D.   Medications: Scheduled Meds:   . acidophilus  1 capsule Oral BID WC  . albuterol  2.5 mg Nebulization TID  . aspirin  81 mg Oral Daily  . azithromycin  500 mg Intravenous Once  . azithromycin  500 mg Intravenous Q24H  . calcium acetate  667 mg Oral TID WC  . cloNIDine  0.2 mg Oral TID  . darbepoetin (ARANESP) injection - DIALYSIS  100 mcg Intravenous Q Wed-HD  . ferric gluconate (FERRLECIT/NULECIT) IV  62.5 mg Intravenous Weekly  . insulin aspart  0-20 Units Subcutaneous TID WC  . insulin aspart  0-5 Units Subcutaneous QHS  . insulin glargine  18 Units Subcutaneous QHS  . ipratropium  0.5 mg Nebulization TID  .  isosorbide mononitrate  120 mg Oral Daily  . levothyroxine  25 mcg Oral Daily  . pantoprazole  40 mg Oral Daily  . paricalcitol  1 mcg Intravenous Q M,W,F-HD  . phytonadione  2.5 mg Oral To Minor  . phytonadione  5 mg Oral Once  . piperacillin-tazobactam (ZOSYN)  IV  2.25 g Intravenous Q8H  . potassium chloride  30 mEq Oral Once  . predniSONE  40 mg Oral QAC breakfast  . sodium chloride  3 mL Intravenous Q12H  . sodium chloride  3 mL Intravenous Q12H  . vancomycin  500 mg Intravenous Q M,W,F-HD  . vancomycin  1,000 mg Intravenous Once  . DISCONTD: albuterol  2.5 mg Nebulization Q6H  . DISCONTD: insulin aspart  0-9 Units Subcutaneous TID WC  . DISCONTD: ipratropium  0.5 mg Nebulization Q6H  . DISCONTD: isosorbide mononitrate  120 mg Oral Daily  . DISCONTD: piperacillin-tazobactam (ZOSYN)  IV  2.25 g Intravenous Q8H  . DISCONTD: piperacillin-tazobactam  (ZOSYN)  IV  2.25 g Intravenous Q8H  . DISCONTD: piperacillin-tazobactam  3.375 g Intravenous Once  . DISCONTD: PROBIOTIC   Oral BID   Continuous Infusions:  PRN Meds:.sodium chloride, sodium chloride, acetaminophen, acetaminophen, acetaminophen, acetaminophen, calcium carbonate (dosed in mg elemental calcium), camphor-menthol, docusate sodium, feeding supplement (NEPRO CARB STEADY), guaiFENesin-dextromethorphan, hydrOXYzine, nitroGLYCERIN, ondansetron (ZOFRAN) IV, ondansetron, oxyCODONE-acetaminophen, sodium chloride, sodium chloride, sorbitol, zolpidem, DISCONTD: ondansetron (ZOFRAN) IV, DISCONTD: ondansetron  Assessment/Plan: Patient Active Hospital Problem List:  1.Acute on Chronic respiratory failure: secondary to Health Care associated Pneumonia with a component of copd exacerbation.  Day 2 of iv antibiotics, on iv vancomycin, zosyn, and azithromycin.  Will repeat cxr tomorrow for resolution of pneumonia  Continue with nasal oxygen.  Albuterol and atrovent nebs as needed.  Will start the patient on low dose steroids with a quick taper.  Continue with robitussin.  PT evaluation appreciated.  2. DM continue with lantus and SSI.  3. Hypertension: controlled.  4. Coagulopathy: supratherapeutic INR.improved. Got 2 doses of vitaminK. guiac positive stools can be explained by supratherapeutic INR, possibly microscopic bleeding. Will continue to monitor H&h. 5. Low grade temp and leukocytosis: improving.  probably secondary to HCAP.  5. ESRD on HD: will call renal consult for initiation of HD.  6. dvt prophylaxis: scd's.    LOS: 1 day   Arlinda Barcelona 02/05/2012, 1:59 PM

## 2012-02-05 NOTE — Progress Notes (Signed)
Pt cbg of 565 on recheck. MD, Dr. Blake Divine, paged with call back. N.o to epic. To admin additional 20 units and recheck prior to dinner. Dondra Spry

## 2012-02-05 NOTE — Progress Notes (Signed)
RT did assessment and changed treatments to Three times a day.  Patient's lungs have been clear to diminished.  Left treatments TID due to chest xray, some shortness of breath yesterday, and also because this is her second admission with a pneumonia.  Will reassess in two days.

## 2012-02-05 NOTE — Progress Notes (Signed)
Pt with elevated cbg of 475. MD, Dr. Blake Divine,  notified via text page for question n.o for insulin dosing. Awaiting call back. Dondra Spry

## 2012-02-05 NOTE — Progress Notes (Signed)
Utilization Review Completed.[employer] T4/02/2012   

## 2012-02-05 NOTE — Consult Note (Signed)
[facility] Renal Consultation Note  Indication for Consultation:  Management of ESRD/hemodialysis; anemia, hypertension/volume and secondary hyperparathyroidism  HPI: [name] [name] is a 71 y.o. female.  Admitted with PNA.  She went to her out patient HD yesterday in [place] feeling somewhat weak finished her  Treatment, went home had progressive coughing / sob with brown sputum.  She has a Fight upper arm avf not used since past admit because of bruising / infiltration she is on ASA and Coumadin for Dec. 2012 PE.     Now in room ,"feeling better no sob and little cough".  Dialysis Orders: Center: [facility] . EDW 59 HD Bath 2.5 ca 2.0 k  Time 3.5hrs Heparin  standard. Access right ij with right  avf upper arm BFR 400 DFR 800    Zemplar 1 mcg IV/HD Epogen 15,600   Units IV/HD  Venofer   50mg  wkly  Other  nepro q hd    Past Medical History  Diagnosis Date  . Diabetes mellitus   . Hypercholesterolemia   . Hypothyroidism   . Colitis   . Chronic kidney disease   . Coronary artery disease   . Hypertension   . GERD (gastroesophageal reflux disease)   . Myocardial infarction   . Peripheral vascular disease   . COPD (chronic obstructive pulmonary disease)   . Pneumonia   . Angina   . Shortness of breath     Past Surgical History  Procedure Date  . Partial hysterectomy   . Tubal ligation   . Amputaton of right fifth toe   . Tonsillectomy   . Insertion of dialysis catheter 10/23/2011    Procedure: INSERTION OF DIALYSIS CATHETER;  Surgeon: Chuck Hint, MD;  Location: [facility] OR;  Service: Vascular;  Laterality: Right;  Right internal jugular Insertion diatek catheter     History reviewed. No pertinent family history.    reports that she quit smoking about 7 months ago. Her smoking use included Cigarettes. She has a 40 pack-year smoking history. She does not have any smokeless tobacco history on file. She reports that she does not drink alcohol or use illicit drugs.   Allergies  Allergen Reactions  . Novocain Other (See Comments)    Unknown    Prior to Admission medications   Medication Sig Start Date End Date Taking? Authorizing Provider  amLODipine (NORVASC) 10 MG tablet Take 10 mg by mouth daily.     Yes Historical Provider, MD  aspirin 81 MG chewable tablet Chew 1 tablet (81 mg total) by mouth daily. 11/01/11 10/31/12 Yes Clydia Llano, MD  calcium acetate (PHOSLO) 667 MG capsule Take 1 capsule (667 mg total) by mouth 3 (three) times daily with meals. 11/01/11 10/31/12 Yes Clydia Llano, MD  cloNIDine (CATAPRES) 0.2 MG tablet Take 0.2 mg by mouth 3 (three) times daily.     Yes Historical Provider, MD  insulin glargine (LANTUS) 100 UNIT/ML injection Inject 18 Units into the skin at bedtime. 11/01/11  Yes Clydia Llano, MD  isosorbide mononitrate (IMDUR) 120 MG 24 hr tablet Take 120 mg by mouth daily.     Yes Historical Provider, MD  levothyroxine (SYNTHROID, LEVOTHROID) 25 MCG tablet Take 25 mcg by mouth daily.     Yes Historical Provider, MD  pantoprazole (PROTONIX) 40 MG tablet Take 40 mg by mouth daily.     Yes Historical Provider, MD  Probiotic Product (PROBIOTIC PO) Take 1 capsule by mouth daily.   Yes Historical Provider, MD  ranitidine (ZANTAC) 150  MG tablet Take 150 mg by mouth 2 (two) times daily.     Yes Historical Provider, MD  warfarin (COUMADIN) 2 MG tablet Take 2 mg by mouth daily.   Yes Historical Provider, MD  nitroGLYCERIN (NITROSTAT) 0.4 MG SL tablet Place 0.4 mg under the tongue every 5 (five) minutes as needed. For chest pain.     Historical Provider, MD  oxyCODONE-acetaminophen (PERCOCET) 5-325 MG per tablet Take 1 tablet by mouth every 6 (six) hours as needed. For pain.    Historical Provider, MD     Results for orders placed during the hospital encounter of 02/04/12 (from the past 48 hour(s))  POCT I-STAT, CHEM 8     Status: Abnormal   Collection Time   02/04/12  2:06 PM      Component Value Range Comment   Sodium 136  135 - 145  (mEq/L)    Potassium 3.7  3.5 - 5.1 (mEq/L)    Chloride 96  96 - 112 (mEq/L)    BUN 10  6 - 23 (mg/dL)    Creatinine, Ser 7.82 (*) 0.50 - 1.10 (mg/dL)    Glucose, Bld 956 (*) 70 - 99 (mg/dL)    Calcium, Ion 2.13 (*) 1.12 - 1.32 (mmol/L)    TCO2 32  0 - 100 (mmol/L)    Hemoglobin 12.2  12.0 - 15.0 (g/dL)    HCT 08.6  57.8 - 46.9 (%)   TROPONIN I     Status: Normal   Collection Time   02/04/12  2:48 PM      Component Value Range Comment   Troponin I <0.30  <0.30 (ng/mL)   PROTIME-INR     Status: Abnormal   Collection Time   02/04/12  3:00 PM      Component Value Range Comment   Prothrombin Time 48.5 (*) 11.6 - 15.2 (seconds)    INR 5.18 (*) 0.00 - 1.49    OCCULT BLOOD, POC DEVICE     Status: Normal   Collection Time   02/04/12  3:14 PM      Component Value Range Comment   Fecal Occult Bld POSITIVE     CBC     Status: Abnormal   Collection Time   02/04/12  3:15 PM      Component Value Range Comment   WBC 14.4 (*) 4.0 - 10.5 (K/uL)    RBC 4.00  3.87 - 5.11 (MIL/uL)    Hemoglobin 10.8 (*) 12.0 - 15.0 (g/dL)    HCT 62.9 (*) 52.8 - 46.0 (%)    MCV 86.8  78.0 - 100.0 (fL)    MCH 27.0  26.0 - 34.0 (pg)    MCHC 31.1  30.0 - 36.0 (g/dL)    RDW 41.3 (*) 24.4 - 15.5 (%)    Platelets 379  150 - 400 (K/uL)   DIFFERENTIAL     Status: Abnormal   Collection Time   02/04/12  3:15 PM      Component Value Range Comment   Neutrophils Relative 85 (*) 43 - 77 (%)    Neutro Abs 12.3 (*) 1.7 - 7.7 (K/uL)    Lymphocytes Relative 8 (*) 12 - 46 (%)    Lymphs Abs 1.2  0.7 - 4.0 (K/uL)    Monocytes Relative 6  3 - 12 (%)    Monocytes Absolute 0.8  0.1 - 1.0 (K/uL)    Eosinophils Relative 0  0 - 5 (%)    Eosinophils Absolute 0.0  0.0 - 0.7 (K/uL)  Basophils Relative 1  0 - 1 (%)    Basophils Absolute 0.1  0.0 - 0.1 (K/uL)   LACTIC ACID, PLASMA     Status: Normal   Collection Time   02/04/12  3:28 PM      Component Value Range Comment   Lactic Acid, Venous 1.3  0.5 - 2.2 (mmol/L)   CULTURE, BLOOD  (ROUTINE X 2)     Status: Normal (Preliminary result)   Collection Time   02/04/12  3:40 PM      Component Value Range Comment   Specimen Description BLOOD FINGER LEFT      Special Requests BOTTLES DRAWN AEROBIC ONLY 4CC      Culture  Setup Time [id number]      Culture        Value:        BLOOD CULTURE RECEIVED NO GROWTH TO DATE CULTURE WILL BE HELD FOR 5 DAYS BEFORE ISSUING A FINAL NEGATIVE REPORT   Report Status PENDING     CULTURE, BLOOD (ROUTINE X 2)     Status: Normal (Preliminary result)   Collection Time   02/04/12  4:00 PM      Component Value Range Comment   Specimen Description BLOOD ARM LEFT      Special Requests BOTTLES DRAWN AEROBIC ONLY 8CC      Culture  Setup Time [id number]      Culture        Value:        BLOOD CULTURE RECEIVED NO GROWTH TO DATE CULTURE WILL BE HELD FOR 5 DAYS BEFORE ISSUING A FINAL NEGATIVE REPORT   Report Status PENDING     GLUCOSE, CAPILLARY     Status: Abnormal   Collection Time   02/04/12  5:32 PM      Component Value Range Comment   Glucose-Capillary 245 (*) 70 - 99 (mg/dL)   CARDIAC PANEL(CRET KIN+CKTOT+MB+TROPI)     Status: Normal   Collection Time   02/04/12  7:33 PM      Component Value Range Comment   Total CK 23  7 - 177 (U/L)    CK, MB 0.9  0.3 - 4.0 (ng/mL)    Troponin I <0.30  <0.30 (ng/mL)    Relative Index RELATIVE INDEX IS INVALID  0.0 - 2.5    HEMOGLOBIN AND HEMATOCRIT, BLOOD     Status: Abnormal   Collection Time   02/04/12  7:34 PM      Component Value Range Comment   Hemoglobin 9.5 (*) 12.0 - 15.0 (g/dL)    HCT 21.3 (*) 08.6 - 46.0 (%)   GLUCOSE, CAPILLARY     Status: Abnormal   Collection Time   02/04/12  8:26 PM      Component Value Range Comment   Glucose-Capillary 334 (*) 70 - 99 (mg/dL)   CARDIAC PANEL(CRET KIN+CKTOT+MB+TROPI)     Status: Normal   Collection Time   02/05/12  2:56 AM      Component Value Range Comment   Total CK 17  7 - 177 (U/L)    CK, MB 1.1  0.3 - 4.0 (ng/mL)    Troponin I <0.30  <0.30 (ng/mL)      Relative Index RELATIVE INDEX IS INVALID  0.0 - 2.5    COMPREHENSIVE METABOLIC PANEL     Status: Abnormal   Collection Time   02/05/12  3:00 AM      Component Value Range Comment   Sodium 132 (*) 135 - 145 (mEq/L)  Potassium 3.2 (*) 3.5 - 5.1 (mEq/L)    Chloride 92 (*) 96 - 112 (mEq/L)    CO2 30  19 - 32 (mEq/L)    Glucose, Bld 245 (*) 70 - 99 (mg/dL)    BUN 19  6 - 23 (mg/dL)    Creatinine, Ser 1.61 (*) 0.50 - 1.10 (mg/dL)    Calcium 8.5  8.4 - 10.5 (mg/dL)    Total Protein 6.5  6.0 - 8.3 (g/dL)    Albumin 2.4 (*) 3.5 - 5.2 (g/dL)    AST 13  0 - 37 (U/L)    ALT 11  0 - 35 (U/L)    Alkaline Phosphatase 115  39 - 117 (U/L)    Total Bilirubin 0.2 (*) 0.3 - 1.2 (mg/dL)    GFR calc non Af Amer 13 (*) >90 (mL/min)    GFR calc Af Amer 15 (*) >90 (mL/min)   CBC     Status: Abnormal   Collection Time   02/05/12  3:00 AM      Component Value Range Comment   WBC 12.3 (*) 4.0 - 10.5 (K/uL)    RBC 3.49 (*) 3.87 - 5.11 (MIL/uL)    Hemoglobin 9.5 (*) 12.0 - 15.0 (g/dL)    HCT 09.6 (*) 04.5 - 46.0 (%)    MCV 87.7  78.0 - 100.0 (fL)    MCH 27.2  26.0 - 34.0 (pg)    MCHC 31.0  30.0 - 36.0 (g/dL)    RDW 40.9 (*) 81.1 - 15.5 (%)    Platelets 363  150 - 400 (K/uL)   PROTIME-INR     Status: Abnormal   Collection Time   02/05/12  3:00 AM      Component Value Range Comment   Prothrombin Time 51.5 (*) 11.6 - 15.2 (seconds) REPEATED TO VERIFY   INR 5.60 (*) 0.00 - 1.49    APTT     Status: Abnormal   Collection Time   02/05/12  3:00 AM      Component Value Range Comment   aPTT 129 (*) 24 - 37 (seconds)   GLUCOSE, CAPILLARY     Status: Abnormal   Collection Time   02/05/12  7:41 AM      Component Value Range Comment   Glucose-Capillary 188 (*) 70 - 99 (mg/dL)   CARDIAC PANEL(CRET KIN+CKTOT+MB+TROPI)     Status: Normal (Preliminary result)   Collection Time   02/05/12  9:40 AM      Component Value Range Comment   Total CK PENDING  7 - 177 (U/L)    CK, MB 0.7  0.3 - 4.0 (ng/mL)    Troponin I  <0.30  <0.30 (ng/mL)    Relative Index PENDING  0.0 - 2.5    PROTIME-INR     Status: Abnormal   Collection Time   02/05/12  9:40 AM      Component Value Range Comment   Prothrombin Time 44.8 (*) 11.6 - 15.2 (seconds)    INR 4.69 (*) 0.00 - 1.49     EKG: normal EKG, normal sinus rhythm, unchanged from previous tracings, LBBB.  ROS:  Negative except  Coughing dark brown sputum, weakness progressive since Monday 4/01. See above hpi  Physical Exam: Filed Vitals:   02/05/12 0608  BP: 109/61  Pulse: 86  Temp: 98.3 F (36.8 C)  Resp: 18     General: alert, elderly WF ,obese, NAD, plesant HEENT:[place], MMM Eyes:Nonicteric Neck: Supple, no jvd Heart: RRR, no rub or murmur Lungs:  bibasilar decreased bs with faint right rhonchi Abdomen: obese, bs +, soft nontender Extremities: no pedal edema Skin:scattered bruising and petechiae  Upper ext. bilat. Neuro: OX3, no acute deficits Dialysis Access: right upper arm avf positive buit with  Bruising  /minimal swelling/ right ij per cath  Assessment/Plan: 1. HC PNA with copd exacerbation= On Iv Vancomycin, Zosyn , Azithromycin , Nebs, steroid taper 2. ESRD -  mwf hd, use perm cath with inr 4.69 and mild bruising from prior infiltration? 10/31/11 insert. Will have Dr. Caryn Section view arm ,?? Any better than Monday 4/01 when he saw her at  [facility]. Center. 3.2 k use 4 k bath fu  k pre hd. Noted 30 meq po kdur given by admit team 3 Hypertension/volume  - hold Clonidine .2mg   Tid for sbp less than 110 and amlodipine 10mg  hs also for sbp less than 110,  edw 59 kg. Today she is at 59.8 will attempt 2 to 3 liters in am, needs newer lower edw and taper  Clonidine down. 4. Anemia  -  hgb 9.5 epo and iron on hd 5. Metabolic bone disease -  zemplar on hd and phoslo binder 6. Nutrition -  Supplement  With Nepro q day 7. Superthereutic INR with heme pos. Stools(-).   admit team giving vit k and repeat inr in am. HO PE  10/2011 8. IDDM = per admit team Lenny Pastel,  PA-C [facility] Beeper [phone] 02/05/2012, 10:24 AM     Cxt.   Mrs. Bottoms is admitted with weakness and cough.  A new RUL infiltrate was noted on CXR.  She did not have fever but did have purulent sputum, elevated WBC , and new infiltrate.   She was just d/c'd from [facility] Mar after being admitted then with UTI and pneumonia.   She has ecchymoses on both arms (PT/INR elevated).  AVF IN L upper arm is being "rested" while infiltration around fistulal needle stick site resolves.  HD sched for tomorrow--4 K bath.  Use cath.

## 2012-02-05 NOTE — Progress Notes (Signed)
CRITICAL VALUE ALERT  Critical value received:  INR 5.60  Date of notification: 02/05/12  Time of notification:0431  Critical value read back:yes  Nurse who received alert:  Milinda Cave, RN  MD notified (1st page): Maren Reamer, NP Time of first page: [phone]  MD notified (2nd page):  Time of second page:  Responding MD:  Maren Reamer, NP  Time MD responded:  [phone] with new orders placed

## 2012-02-05 NOTE — ED Provider Notes (Signed)
I saw and evaluated the patient, reviewed the resident's note and I agree with the findings and plan. Admit to triad for pneumonia  Loren Racer, MD 02/05/12 [phone]

## 2012-02-05 NOTE — Progress Notes (Signed)
MD, Dr Blake Divine, with call back. N.o to epic.

## 2012-02-05 NOTE — Evaluation (Addendum)
Physical Therapy Evaluation and Discharge.  Patient Details Name: [name] [name] MRN: [id number] DOB: 05-31-1941 Today's Date: 02/05/2012  Problem List:  Patient Active Problem List  Diagnoses  . Diabetes mellitus  . Peripheral arterial disease  . Hypercholesterolemia  . Hypothyroidism  . Mycotic toenails  . ARF (acute respiratory failure)  . Pleural effusion, bilateral  . Hyperkalemia  . AKI (acute kidney injury)  . CKD (chronic kidney disease) stage 5, GFR less than 15 ml/min  . DM (diabetes mellitus)  . COPD with acute exacerbation  . HTN (hypertension)  . Anemia  . Cardiac enzymes elevated  . UTI (lower urinary tract infection)  . Acute-on-chronic respiratory failure  . HCAP (healthcare-associated pneumonia)  . ESRD (end stage renal disease) on dialysis  . History of DVT (deep vein thrombosis)  . History of pulmonary embolus (PE)    Past Medical History:  Past Medical History  Diagnosis Date  . Diabetes mellitus   . Hypercholesterolemia   . Hypothyroidism   . Colitis   . Chronic kidney disease   . Coronary artery disease   . Hypertension   . GERD (gastroesophageal reflux disease)   . Myocardial infarction   . Peripheral vascular disease   . COPD (chronic obstructive pulmonary disease)   . Pneumonia   . Angina   . Shortness of breath    Past Surgical History:  Past Surgical History  Procedure Date  . Partial hysterectomy   . Tubal ligation   . Amputaton of right fifth toe   . Tonsillectomy   . Insertion of dialysis catheter 10/23/2011    Procedure: INSERTION OF DIALYSIS CATHETER;  Surgeon: Chuck Hint, MD;  Location: [facility] OR;  Service: Vascular;  Laterality: Right;  Right internal jugular Insertion diatek catheter    PT Assessment/Plan/Recommendation PT Assessment Clinical Impression Statement: Pt is a 71 y/o female admitted with pneumonia.  Pt was on continuous O2 at home.  Pt independent with all mobility and presents with no acute PT needs.   PT  Recommendation/Assessment: Patent does not need any further PT services No Skilled PT: Patient at baseline level of functioning;Patient is independent with all acitivity/mobility PT Recommendation Follow Up Recommendations: Outpatient PT Equipment Recommended: None recommended by PT PT Goals  Acute Rehab PT Goals PT Goal Formulation: With patient  PT Evaluation Precautions/Restrictions  Precautions Precautions: Fall Required Braces or Orthoses: No Restrictions Weight Bearing Restrictions: No Prior Functioning  Home Living Lives With: [name] [name] Help From: Family Type of Home: House Home Layout: One level Home Access: Ramped entrance Bathroom Shower/Tub: Other (comment) Bathroom Toilet: Standard Bathroom Accessibility: Yes How Accessible: Accessible via walker Home Adaptive Equipment: Bedside commode/3-in-1 Prior Function Level of Independence: Independent with basic ADLs;Independent with transfers;Independent with homemaking with ambulation;Independent with gait Able to Take Stairs?: Reciprically Driving: No Vocation: Retired Producer, television/film/video: Awake/alert Overall Cognitive Status: Appears within functional limits for tasks assessed Sensation/Coordination Sensation Light Touch: Appears Intact Stereognosis: Not tested Hot/Cold: Not tested Proprioception: Appears Intact Coordination Gross Motor Movements are Fluid and Coordinated: Yes Fine Motor Movements are Fluid and Coordinated: Yes Extremity Assessment RUE Assessment RUE Assessment: Within Functional Limits LUE Assessment LUE Assessment: Within Functional Limits RLE Assessment RLE Assessment: Within Functional Limits LLE Assessment LLE Assessment: Within Functional Limits Mobility (including Balance) Bed Mobility Bed Mobility: Yes Supine to Sit: 6: Modified independent (Device/Increase time);HOB elevated (Comment degrees) (due to sob) Sitting - Scoot to Edge of Bed: 7: Independent Sit  to Supine: Not Tested (comment) Transfers Transfers: Yes Sit  to Stand: 7: Independent;From bed Stand to Sit: 7: Independent;To chair/3-in-1;With armrests Stand Pivot Transfers: 7: Independent Ambulation/Gait Ambulation/Gait: Yes Ambulation/Gait Assistance: 7: Independent (Pt on 4L O2 via [place] O2 sats greater than 90 throughout. ) Ambulation Distance (Feet): 60 Feet Assistive device: None Gait Pattern: Within Functional Limits Gait velocity: WFL Stairs: No Wheelchair Mobility Wheelchair Mobility: No  Posture/Postural Control Posture/Postural Control: No significant limitations Balance Balance Assessed: No Exercise    End of Session PT - End of Session Equipment Utilized During Treatment: Gait belt Activity Tolerance: Patient tolerated treatment well Patient left: in chair;with call bell in reach Nurse Communication: Mobility status for transfers;Mobility status for ambulation General Behavior During Session: [facility] for tasks performed Cognition: [facility] for tasks performed  Dominiq Fontaine 02/05/2012, 1:41 PM Brenan Modesto L. Jonae Renshaw DPT [phone]

## 2012-02-05 NOTE — Progress Notes (Signed)
Inpatient Diabetes Program Recommendations  AACE/ADA: New Consensus Statement on Inpatient Glycemic Control (2009)  Target Ranges:  Prepandial:   less than 140 mg/dL      Peak postprandial:   less than 180 mg/dL (1-2 hours)      Critically ill patients:  140 - 180 mg/dL   Reason for Visit: Hyperglycemia on steroid therapy:  Need for addition of meal coverage.  Inpatient Diabetes Program Recommendations Insulin - Basal: Agree with addition of Lantus at 16 units. Correction (SSI): BUT should not increase correction to resistant from sensitive tidwc, as this is a potential for causing hypoglycemia. Insulin - Meal Coverage: Agree that patient needs meal coverage, and 5 units is a good start.  Note: Recommend keeping the correction at sensitive tidwc plus HS correction and starting the 5 units meal coverage tidwc as ordered. Thank you, Lenor Coffin, RN, CNS, Diabetes Coordinator [phone])

## 2012-02-06 ENCOUNTER — Other Ambulatory Visit: Payer: Self-pay

## 2012-02-06 ENCOUNTER — Inpatient Hospital Stay (HOSPITAL_COMMUNITY): Payer: Medicare Other

## 2012-02-06 DIAGNOSIS — J9620 Acute and chronic respiratory failure, unspecified whether with hypoxia or hypercapnia: Secondary | ICD-10-CM

## 2012-02-06 DIAGNOSIS — N186 End stage renal disease: Secondary | ICD-10-CM

## 2012-02-06 DIAGNOSIS — J810 Acute pulmonary edema: Secondary | ICD-10-CM | POA: Diagnosis not present

## 2012-02-06 DIAGNOSIS — E119 Type 2 diabetes mellitus without complications: Secondary | ICD-10-CM

## 2012-02-06 DIAGNOSIS — Z86718 Personal history of other venous thrombosis and embolism: Secondary | ICD-10-CM

## 2012-02-06 DIAGNOSIS — Z86711 Personal history of pulmonary embolism: Secondary | ICD-10-CM

## 2012-02-06 DIAGNOSIS — J441 Chronic obstructive pulmonary disease with (acute) exacerbation: Secondary | ICD-10-CM

## 2012-02-06 LAB — BLOOD GAS, ARTERIAL
Patient temperature: 98.6
TCO2: 28 mmol/L (ref 0–100)
pCO2 arterial: 64.8 mmHg (ref 35.0–45.0)
pH, Arterial: 7.227 — ABNORMAL LOW (ref 7.350–7.400)

## 2012-02-06 LAB — COMPREHENSIVE METABOLIC PANEL
ALT: 16 U/L (ref 0–35)
AST: 21 U/L (ref 0–37)
Albumin: 2.6 g/dL — ABNORMAL LOW (ref 3.5–5.2)
CO2: 25 mEq/L (ref 19–32)
Chloride: 84 mEq/L — ABNORMAL LOW (ref 96–112)
Creatinine, Ser: 5.04 mg/dL — ABNORMAL HIGH (ref 0.50–1.10)
GFR calc non Af Amer: 8 mL/min — ABNORMAL LOW (ref >90)
Potassium: 4.8 mEq/L (ref 3.5–5.1)
Sodium: 123 mEq/L — ABNORMAL LOW (ref 135–145)
Total Bilirubin: 0.2 mg/dL — ABNORMAL LOW (ref 0.3–1.2)

## 2012-02-06 LAB — POCT I-STAT 3, ART BLOOD GAS (G3+)
TCO2: 28 mmol/L (ref 0–100)
pCO2 arterial: 50.2 mmHg — ABNORMAL HIGH (ref 35.0–45.0)
pH, Arterial: 7.338 — ABNORMAL LOW (ref 7.350–7.400)

## 2012-02-06 LAB — GLUCOSE, CAPILLARY
Glucose-Capillary: 341 mg/dL — ABNORMAL HIGH (ref 70–99)
Glucose-Capillary: 279 mg/dL — ABNORMAL HIGH (ref 70–99)
Glucose-Capillary: 358 mg/dL — ABNORMAL HIGH (ref 70–99)
Glucose-Capillary: 277 mg/dL — ABNORMAL HIGH (ref 70–99)

## 2012-02-06 LAB — DIFFERENTIAL
Basophils Absolute: 0.1 10*3/uL (ref 0.0–0.1)
Basophils Relative: 0 % (ref 0–1)
Lymphocytes Relative: 3 % — ABNORMAL LOW (ref 12–46)
Monocytes Absolute: 0.7 10*3/uL (ref 0.1–1.0)
Neutro Abs: 23.4 10*3/uL — ABNORMAL HIGH (ref 1.7–7.7)
Neutrophils Relative %: 95 % — ABNORMAL HIGH (ref 43–77)

## 2012-02-06 LAB — CARDIAC PANEL(CRET KIN+CKTOT+MB+TROPI)
CK, MB: 1.8 ng/mL (ref 0.3–4.0)
CK, MB: 2.4 ng/mL (ref 0.3–4.0)
Relative Index: INVALID (ref 0.0–2.5)
Total CK: 34 U/L (ref 7–177)
Total CK: 25 U/L (ref 7–177)
Troponin I: 1.54 ng/mL (ref <0.30)
Troponin I: 0.34 ng/mL (ref <0.30)
Troponin I: 1.39 ng/mL (ref <0.30)

## 2012-02-06 LAB — CBC
HCT: 31.5 % — ABNORMAL LOW (ref 36.0–46.0)
MCHC: 31.1 g/dL (ref 30.0–36.0)
Platelets: 403 10*3/uL — ABNORMAL HIGH (ref 150–400)
RDW: 17.9 % — ABNORMAL HIGH (ref 11.5–15.5)
WBC: 24.8 10*3/uL — ABNORMAL HIGH (ref 4.0–10.5)

## 2012-02-06 LAB — HEPATITIS B SURFACE ANTIGEN: Hepatitis B Surface Ag: NEGATIVE

## 2012-02-06 LAB — PROCALCITONIN: Procalcitonin: 0.77 ng/mL

## 2012-02-06 LAB — PRO B NATRIURETIC PEPTIDE: Pro B Natriuretic peptide (BNP): 10121 pg/mL — ABNORMAL HIGH (ref 0–125)

## 2012-02-06 MED ORDER — DEXTROSE 50 % IV SOLN
25.0000 mL | Freq: Once | INTRAVENOUS | Status: DC
  Filled 2012-02-06: qty 50

## 2012-02-06 MED ORDER — METHYLPREDNISOLONE SODIUM SUCC 125 MG IJ SOLR
80.0000 mg | Freq: Once | INTRAMUSCULAR | Status: AC
  Administered 2012-02-06: 80 mg via INTRAVENOUS

## 2012-02-06 MED ORDER — LORAZEPAM 2 MG/ML IJ SOLN
0.2500 mg | Freq: Once | INTRAMUSCULAR | Status: AC
  Administered 2012-02-06: 0.26 mg via INTRAVENOUS

## 2012-02-06 MED ORDER — IPRATROPIUM BROMIDE 0.02 % IN SOLN
0.5000 mg | Freq: Three times a day (TID) | RESPIRATORY_TRACT | Status: DC
  Administered 2012-02-07 – 2012-02-10 (×7): 0.5 mg via RESPIRATORY_TRACT
  Filled 2012-02-06 (×12): qty 2.5

## 2012-02-06 MED ORDER — LEVOFLOXACIN 750 MG PO TABS
750.0000 mg | ORAL_TABLET | ORAL | Status: DC
  Administered 2012-02-10 – 2012-02-11 (×2): 750 mg via ORAL
  Filled 2012-02-06 (×3): qty 1

## 2012-02-06 MED ORDER — WARFARIN - PHARMACIST DOSING INPATIENT
Freq: Every day | Status: DC
  Administered 2012-02-06 – 2012-02-11 (×2)

## 2012-02-06 MED ORDER — LEVOFLOXACIN IN D5W 750 MG/150ML IV SOLN
750.0000 mg | INTRAVENOUS | Status: DC
  Filled 2012-02-06: qty 150

## 2012-02-06 MED ORDER — IPRATROPIUM BROMIDE 0.02 % IN SOLN
0.5000 mg | RESPIRATORY_TRACT | Status: DC
  Administered 2012-02-06 (×5): 0.5 mg via RESPIRATORY_TRACT
  Filled 2012-02-06 (×5): qty 2.5

## 2012-02-06 MED ORDER — LEVOTHYROXINE SODIUM 25 MCG PO TABS
25.0000 ug | ORAL_TABLET | Freq: Every day | ORAL | Status: DC
  Administered 2012-02-06 – 2012-02-14 (×9): 25 ug via ORAL
  Filled 2012-02-06 (×12): qty 1

## 2012-02-06 MED ORDER — ALBUTEROL SULFATE (5 MG/ML) 0.5% IN NEBU: 2.5000 mg | INHALATION_SOLUTION | RESPIRATORY_TRACT | Status: DC | PRN

## 2012-02-06 MED ORDER — ALBUTEROL SULFATE (5 MG/ML) 0.5% IN NEBU
INHALATION_SOLUTION | RESPIRATORY_TRACT | Status: AC
  Filled 2012-02-06: qty 0.5

## 2012-02-06 MED ORDER — ALBUTEROL SULFATE (5 MG/ML) 0.5% IN NEBU
2.5000 mg | INHALATION_SOLUTION | RESPIRATORY_TRACT | Status: DC
  Administered 2012-02-06 (×5): 2.5 mg via RESPIRATORY_TRACT
  Filled 2012-02-06 (×5): qty 0.5

## 2012-02-06 MED ORDER — LEVALBUTEROL HCL 0.63 MG/3ML IN NEBU
0.6300 mg | INHALATION_SOLUTION | RESPIRATORY_TRACT | Status: DC | PRN
  Filled 2012-02-06: qty 3

## 2012-02-06 MED ORDER — ALBUTEROL SULFATE (5 MG/ML) 0.5% IN NEBU: 2.5000 mg | INHALATION_SOLUTION | RESPIRATORY_TRACT | Status: DC

## 2012-02-06 MED ORDER — ALBUTEROL SULFATE (5 MG/ML) 0.5% IN NEBU
2.5000 mg | INHALATION_SOLUTION | RESPIRATORY_TRACT | Status: DC | PRN
  Administered 2012-02-06: 2.5 mg via RESPIRATORY_TRACT
  Filled 2012-02-06: qty 0.5

## 2012-02-06 MED ORDER — IPRATROPIUM BROMIDE 0.02 % IN SOLN: 0.5000 mg | RESPIRATORY_TRACT | Status: DC | PRN

## 2012-02-06 MED ORDER — WARFARIN SODIUM 2 MG PO TABS
2.0000 mg | ORAL_TABLET | Freq: Once | ORAL | Status: AC
  Administered 2012-02-06: 2 mg via ORAL
  Filled 2012-02-06: qty 1

## 2012-02-06 MED ORDER — SODIUM CHLORIDE 0.9 % IV SOLN: INTRAVENOUS | Status: DC

## 2012-02-06 MED ORDER — LEVOFLOXACIN IN D5W 750 MG/150ML IV SOLN
750.0000 mg | INTRAVENOUS | Status: DC
  Administered 2012-02-06: 750 mg via INTRAVENOUS
  Filled 2012-02-06: qty 150

## 2012-02-06 MED ORDER — RENA-VITE PO TABS
1.0000 | ORAL_TABLET | Freq: Every day | ORAL | Status: DC
  Administered 2012-02-07 – 2012-02-13 (×8): 1 via ORAL
  Filled 2012-02-06 (×10): qty 1

## 2012-02-06 MED ORDER — METHYLPREDNISOLONE SODIUM SUCC 125 MG IJ SOLR
80.0000 mg | Freq: Three times a day (TID) | INTRAMUSCULAR | Status: DC
  Administered 2012-02-06: 80 mg via INTRAVENOUS
  Filled 2012-02-06 (×3): qty 1.28

## 2012-02-06 MED ORDER — METHYLPREDNISOLONE SODIUM SUCC 125 MG IJ SOLR
INTRAMUSCULAR | Status: AC
  Administered 2012-02-06: 125 mg
  Filled 2012-02-06: qty 2

## 2012-02-06 MED ORDER — INSULIN GLARGINE 100 UNIT/ML ~~LOC~~ SOLN
10.0000 [IU] | Freq: Every day | SUBCUTANEOUS | Status: DC
  Administered 2012-02-07 – 2012-02-13 (×7): 10 [IU] via SUBCUTANEOUS

## 2012-02-06 MED ORDER — LORAZEPAM 0.5 MG PO TABS: 0.2500 mg | ORAL_TABLET | Freq: Once | ORAL | Status: DC

## 2012-02-06 MED ORDER — INSULIN ASPART 100 UNIT/ML ~~LOC~~ SOLN
0.0000 [IU] | SUBCUTANEOUS | Status: DC
  Administered 2012-02-06: 9 [IU] via SUBCUTANEOUS
  Administered 2012-02-06 (×2): 5 [IU] via SUBCUTANEOUS
  Administered 2012-02-06: 2 [IU] via SUBCUTANEOUS
  Administered 2012-02-06: 7 [IU] via SUBCUTANEOUS
  Administered 2012-02-07 (×2): 2 [IU] via SUBCUTANEOUS
  Administered 2012-02-07: 9 [IU] via SUBCUTANEOUS
  Administered 2012-02-07: 3 [IU] via SUBCUTANEOUS
  Administered 2012-02-07: 9 [IU] via SUBCUTANEOUS
  Administered 2012-02-08: 2 [IU] via SUBCUTANEOUS
  Administered 2012-02-08: 3 [IU] via SUBCUTANEOUS
  Administered 2012-02-08: 1 [IU] via SUBCUTANEOUS
  Administered 2012-02-08: 2 [IU] via SUBCUTANEOUS
  Administered 2012-02-08: 5 [IU] via SUBCUTANEOUS
  Administered 2012-02-09: 2 [IU] via SUBCUTANEOUS
  Administered 2012-02-09: 5 [IU] via SUBCUTANEOUS
  Administered 2012-02-10 (×3): 1 [IU] via SUBCUTANEOUS
  Administered 2012-02-10: 5 [IU] via SUBCUTANEOUS
  Administered 2012-02-11: 2 [IU] via SUBCUTANEOUS
  Administered 2012-02-12 (×2): 3 [IU] via SUBCUTANEOUS
  Administered 2012-02-12: 5 [IU] via SUBCUTANEOUS
  Administered 2012-02-13 – 2012-02-14 (×4): 1 [IU] via SUBCUTANEOUS

## 2012-02-06 MED ORDER — LORAZEPAM 2 MG/ML IJ SOLN
INTRAMUSCULAR | Status: AC
  Filled 2012-02-06: qty 1

## 2012-02-06 MED ORDER — IPRATROPIUM BROMIDE 0.02 % IN SOLN
0.5000 mg | RESPIRATORY_TRACT | Status: DC | PRN
  Administered 2012-02-06: 0.5 mg via RESPIRATORY_TRACT
  Filled 2012-02-06: qty 2.5

## 2012-02-06 MED ORDER — IPRATROPIUM BROMIDE 0.02 % IN SOLN: 0.5000 mg | Freq: Three times a day (TID) | RESPIRATORY_TRACT | Status: DC

## 2012-02-06 MED ORDER — ALBUTEROL SULFATE (5 MG/ML) 0.5% IN NEBU
2.5000 mg | INHALATION_SOLUTION | Freq: Three times a day (TID) | RESPIRATORY_TRACT | Status: DC
  Administered 2012-02-07 – 2012-02-10 (×7): 2.5 mg via RESPIRATORY_TRACT
  Filled 2012-02-06 (×9): qty 0.5

## 2012-02-06 NOTE — Progress Notes (Signed)
Spoke with son, Isobelle Tuckett, and notified him of patient's change in condition and that she will be transferred to ICU 2100.  Son stated that he would come see her in the morning, advised him that he will be notified if there is a worsening of her condition.

## 2012-02-06 NOTE — Significant Event (Addendum)
Rapid Response Event Note Called by Charge RN to evaluate patient for transfer to ICU Overview: Time Called: 0142 Arrival Time: 0145 Event Type: Respiratory  Initial Focused Assessment: Upon arrival, patient in distress on NRB, lethargic but oriented, vital signs noted in doc flow sheets, skin pale and cool, audible wheeze in upper lobes and using accessory muscles for inspiration  Interventions: Albuterol treatment given by RT, CXR, ABG drawn, Dr. Herma Carson with CCM at bedside, patient monitored and transferred to 2100 by Rapid Response RN and [facility]  Event Summary: Name of Physician Notified: Dr. Janee Morn at [phone]  Name of Consulting Physician Notified: Dr. Herma Carson at 0145  Outcome: Transferred (Comment) (tx [id number])  Event End Time: 0245  Lenord Carbo

## 2012-02-06 NOTE — Progress Notes (Signed)
PHARMACY - ANTICOAGULATION CONSULT INITIAL NOTE  Pharmacy Consult for: Coumadin  Indication: H/o VTE    Patient Data:   Allergies: Allergies  Allergen Reactions  . Novocain Other (See Comments)    Unknown    Patient Measurements: Height: 5\' 5"  (165.1 cm) Weight: 141 lb 12.1 oz (64.3 kg) IBW/kg (Calculated) : 57    Vital Signs: Temp:  [97.4 F (36.3 C)-98.3 F (36.8 C)] 97.4 F (36.3 C) (04/04 2048) Pulse Rate:  [80-120] 116  (04/05 0230) Resp:  [18-26] 20  (04/05 0230) BP: (101-166)/(61-80) 101/71 mmHg (04/05 0230) SpO2:  [74 %-95 %] 95 % (04/05 0230) FiO2 (%):  [100 %] 100 % (04/05 0230) Weight:  [138 lb 10.7 oz (62.9 kg)-141 lb 12.1 oz (64.3 kg)] 141 lb 12.1 oz (64.3 kg) (04/05 0230)  Intake/Output from previous day:  Intake/Output Summary (Last 24 hours) at 02/06/12 0243 Last data filed at 02/05/12 2000  Gross per 24 hour  Intake    780 ml  Output    600 ml  Net    180 ml    Labs:  Basename 02/05/12 1945 02/05/12 0940 02/05/12 0300 02/05/12 0256 02/04/12 1934 02/04/12 1933 02/04/12 1515 02/04/12 1500 02/04/12 1406  HGB 9.9* -- 9.5* -- -- -- -- -- --  HCT 32.1* -- 30.6* -- 31.2* -- -- -- --  PLT -- -- 363 -- -- -- 379 -- --  APTT -- -- 129* -- -- -- -- -- --  LABPROT -- 44.8* 51.5* -- -- -- -- 48.5* --  INR -- 4.69* 5.60* -- -- -- -- 5.18* --  HEPARINUNFRC -- -- -- -- -- -- -- -- --  CREATININE -- -- 3.36* -- -- -- -- -- 2.30*  CKTOTAL -- 20 -- 17 -- 23 -- -- --  CKMB -- 0.7 -- 1.1 -- 0.9 -- -- --  TROPONINI -- <0.30 -- <0.30 -- <0.30 -- -- --   Estimated Creatinine Clearance: 14.4 ml/min (by C-G formula based on Cr of 3.36).  Medical History: Past Medical History  Diagnosis Date  . Diabetes mellitus   . Hypercholesterolemia   . Hypothyroidism   . Colitis   . Chronic kidney disease   . Coronary artery disease   . Hypertension   . GERD (gastroesophageal reflux disease)   . Myocardial infarction   . Peripheral vascular disease   . COPD (chronic  obstructive pulmonary disease)   . Pneumonia   . Angina   . Shortness of breath     Scheduled medications:     . acidophilus  1 capsule Oral BID WC  . ipratropium  0.5 mg Nebulization Q4H   And  . albuterol  2.5 mg Nebulization Q4H  . albuterol      . albuterol      . aspirin  81 mg Oral Daily  . calcium acetate  667 mg Oral TID WC  . darbepoetin (ARANESP) injection - DIALYSIS  100 mcg Intravenous Q Wed-HD  . ferric gluconate (FERRLECIT/NULECIT) IV  62.5 mg Intravenous Weekly  . insulin aspart  0-20 Units Subcutaneous TID WC  . insulin aspart  0-5 Units Subcutaneous QHS  . insulin aspart  20 Units Subcutaneous Once  . insulin aspart  5 Units Subcutaneous TID WC  . insulin glargine  18 Units Subcutaneous QHS  . levofloxacin (LEVAQUIN) IV  750 mg Intravenous Q24H  . levofloxacin (LEVAQUIN) IV  750 mg Intravenous Q M,W,F-2000  . levothyroxine  25 mcg Oral Daily  . LORazepam      .  LORazepam  0.26 mg Intravenous Once  . methylPREDNISolone (SOLU-MEDROL) injection  80 mg Intravenous Once  . methylPREDNISolone (SOLU-MEDROL) injection  80 mg Intravenous Q8H  . methylPREDNISolone sodium succinate      . pantoprazole  40 mg Oral Daily  . paricalcitol  1 mcg Intravenous Q M,W,F-HD  . phytonadione  5 mg Oral Once  . piperacillin-tazobactam (ZOSYN)  IV  2.25 g Intravenous Q8H  . potassium chloride  30 mEq Oral Once  . sodium chloride  3 mL Intravenous Q12H  . vancomycin  500 mg Intravenous Q M,W,F-HD  . DISCONTD: albuterol  2.5 mg Nebulization Q6H  . DISCONTD: albuterol  2.5 mg Nebulization TID  . DISCONTD: azithromycin  500 mg Intravenous Q24H  . DISCONTD: cloNIDine  0.2 mg Oral TID  . DISCONTD: insulin aspart  0-9 Units Subcutaneous TID WC  . DISCONTD: ipratropium  0.5 mg Nebulization Q6H  . DISCONTD: ipratropium  0.5 mg Nebulization TID  . DISCONTD: isosorbide mononitrate  120 mg Oral Daily  . DISCONTD: LORazepam  0.25 mg Oral Once  . DISCONTD: predniSONE  40 mg Oral QAC  breakfast  . DISCONTD: sodium chloride  3 mL Intravenous Q12H     Assessment:  71 y.o. female admitted on 02/04/2012, with HCAP. Pharmacy consulted to manage Coumadin for h/o DVT/PE. INR 4.69 on 02/05/12 AM, and patient has since received 5mg  phytonadione. Will obtain daily PT / INR, starting today (02/06/12).   Goal of Therapy:  1. INR 2-3  Plan:  1.  Daily PT/INR  Dineen Kid. Thad Ranger, PharmD 02/06/2012, 2:43 AM

## 2012-02-06 NOTE — Progress Notes (Signed)
Name: [name] [name] MRN: [id number] DOB: 23-Apr-1941    LOS: 2  PCCM Progress NOTE  Subjective: patient states she is feeling well today; states she feels much better than yesterday.  Currently on HD.  Denies SOB, cough, chest pain, nausea, vomiting, diarrhea, headache, or other complaint.  Vital Signs: Temp:  [96 F (35.6 C)-98 F (36.7 C)] 97.4 F (36.3 C) (04/05 1204) Pulse Rate:  [80-120] 116  (04/05 1400) Resp:  [15-26] 24  (04/05 1400) BP: (90-166)/(40-80) 117/49 mmHg (04/05 1400) SpO2:  [74 %-100 %] 89 % (04/05 1400) FiO2 (%):  [60 %-100 %] 80 % (04/05 0802) Weight:  [138 lb 10.7 oz (62.9 kg)-141 lb 12.1 oz (64.3 kg)] 141 lb 12.1 oz (64.3 kg) (04/05 0500) I/O last 3 completed shifts: In: 930 [P.O.:780; IV Piggyback:150] Out: 600 [Urine:600]  Lines / Drains:  12/20 DH catheter   Cultures:  4/3 Blood>>> NGTD  Antibiotics:  4/3 Vancomycin  4/3 Zosyn  4/3 Azithromycin >>> 4/5  4/5 Levaquin  Tests / Events:  4/5 Hypoxic hypercarbic respiratory failure, pulmonary edema, BiPAP started, urgent HD pending  Physical Examination: Vital signs reviewed. GEN: No apparent distress.  Alert and oriented x 3.  Pleasant, conversant, and cooperative to exam. HEENT: head is autraumatic and normocephalic.  Neck is supple without palpable masses or lymphadenopathy.  No JVD or carotid bruits.  Vision intact.  EOMI.  PERRLA.  Sclerae anicteric.  Conjunctivae without pallor or injection. Mucous membranes are moist.  Oropharynx is without erythema, exudates, or other abnormal lesions.  R tunneled HD cath appears clean and intact. RESP:  Lungs are clear to ascultation bilaterally with good air movement.  No wheezes, ronchi, or rubs. CARDIOVASCULAR: regular rate, normal rhythm.  Clear S1, S2, no murmurs, gallops, or rubs. ABDOMEN: soft, non-tender, non-distended.  Bowels sounds present in all quadrants and normoactive.  No palpable masses. EXT: warm and dry.  No clubbing or cyanosis.  Trace edema in  bilateral lower extremities. SKIN: warm and dry with normal turgor.  No rashes or abnormal lesions observed. NEURO: CN 2-12 grossly intact.  No focal deficit.   Lab Results: Basic Metabolic Panel:  Lab 02/06/12 7846 02/05/12 0300  NA 123* 132*  K 4.8 3.2*  CL 84* 92*  CO2 25 30  GLUCOSE 348* 245*  BUN 35* 19  CREATININE 5.04* 3.36*  CALCIUM 8.6 8.5  MG -- --  PHOS -- --   Liver Function Tests:  Lab 02/06/12 0407 02/05/12 0300  AST 21 13  ALT 16 11  ALKPHOS 135* 115  BILITOT 0.2* 0.2*  PROT 7.0 6.5  ALBUMIN 2.6* 2.4*   CBC:  Lab 02/06/12 0407 02/05/12 1945 02/05/12 0300 02/04/12 1515  WBC 24.8* -- 12.3* --  NEUTROABS 23.4* -- -- 12.3*  HGB 9.8* 9.9* -- --  HCT 31.5* 32.1* -- --  MCV 87.0 -- 87.7 --  PLT 403* -- 363 --   Cardiac Enzymes:  Lab 02/06/12 0920 02/06/12 0407 02/05/12 0940  CKTOTAL 34 24 20  CKMB 2.4 1.8 0.7  CKMBINDEX -- -- --  TROPONINI 1.54* <0.30 <0.30   BNP:  Lab 02/06/12 0407  PROBNP 10121.0*   DCBG:  Lab 02/06/12 1202 02/06/12 0810 02/06/12 0505 02/06/12 0335 02/06/12 0113 02/06/12 0014  GLUCAP 306* 155* 279* 341* 367* 385*   Coagulation:  Lab 02/06/12 0407 02/05/12 0940 02/05/12 0300 02/04/12 1500  LABPROT 23.2* 44.8* 51.5* 48.5*  INR 2.02* 4.69* 5.60* 5.18*    Imaging:   02/06/12 CXR: bilateral pulmonary  edema.  No focal opacity/infiltrate.  Assessment and Plan:  Shortness of breath:  Patient is feeling much better after BiPap overnight and after HD (3.7L removed).  She is now on 4L O2 by [place]; this is her home dose.  She is without wheezing on exam; I am not convinced she is experiencing an acute exacerbation of COPD.  She is also afebrile and without evidence of focal infiltrate but admits to productive cough PTA; blood cultures are without growth.  Her SOB is most likely the result of fluid overload 2/2 ESRD in the addition of acute bronchitis. - Will transfer out of ICU today - D/C solumedrol  - D/C vanc and zosyn (no evidence  of HCAP) - Continue Levaquin for empiric treatment of bacterial bronchitis - Repeat CXR today post HD session  Elevated Tn-I: Cardiac enzymes ordered on admission.  Third set obtained post-HD returned with Tn-I elevated at 1.54; patient asymptomatic.  EKG obtained was without ST changes, evidence of ischemia, or other concerning abnormality.  Tn-I may be elevate in pts with ESRD, however she has numerous risk factors for ACS. - Will transfer to tele bed - Cycle cardiac enzymes to follow trend - If Tn-I continues to rise, will start heparin and consult cardiology - Will ensure need for close follow up on this with physician accepting transfer  Leukocytosis: likely 2/2 steroids.  Will continue to monitor.  DM: CBGs elevated, likely from steroids.  Expect CBGs will normalize following d/c of steroids. - Continue to monitor - Continue SSI and Lantus  HX PE: resume coumadin today and continue coumadin with goal INR 2-3.  ESRD: continue HD per renal  Best Practice: DVT: coumadin SUP: no indications Nutrition: carb modified Glycemic control: insulin   DISPO: will transfer to tele bed today.  Will sign off and remain available for any questions.  MILLS,KRISTIN 02/06/2012, 2:57 PM   Pt seen and examined and database reviewed. I agree with above findings, assessment and plan  Billy Fischer, MD;  PCCM service; Mobile [phone]

## 2012-02-06 NOTE — Progress Notes (Signed)
Was called by nursing as patient was hypoxic with Sao in 70s on 2L and placed on NRB Came to evaluate patient Patient using accessory muscles of respirations and thoracoabdominal breathing Resp: Some coarse BS, TIGHT, exp wheezing CV Tachycardic Abd: Soft /NT/ND + BS Ext : No c/c/e A/P Acute on Chronic resp Failure Secondary to HCAP and acute COPD exac. Will check ABG/PCXR. Give solumedrol 80mg  IVPx1, nebs prn, Continue antibiotics. BIPAP. PCCM consult. Transfer to ICU.

## 2012-02-06 NOTE — Progress Notes (Signed)
Pt transferred from 2100 to Rm 6733. Pt is alert and oriented no complaints of pain or shortness of breath at this time. Currently on 4L via [place]. Placed on tele, personal belongings at bedside. Oriented to room, instructed to call for assistance before getting out of bed. Will continue monitoring  [name] [name]

## 2012-02-06 NOTE — Progress Notes (Signed)
Pt called out for nurse to come to room. Pt appeared restless, signs of respiratory distress with labored breathing, wheezing,  Respirations-26, oxygen on 4 liters was 74%, P:118, Bp 166/84. Pt placed on non rebreather at 100%. Respiratory therapist paged and Dr. Janee Morn on call for triad paged regarding pt's current condition. Pt's oxygen level increased to 94% while on non rebreather and having non productive cough. Orders received and pt given  Atrovent prn dose.  While given tx. Pts O2 sats dropped to mid 80s. And showing signs of distress still, placed back on nonrebreather with sats maintaining in low 90s. Pt given solumedryl, ativan IV, and later given albuterol per pulmonary MD. That came to assess patient.  Pt maintained in mid 90s on nonrebreather, with respirations in 20s.  Rapid response came to assess patient, and assisted with transfer to ICU.

## 2012-02-06 NOTE — Progress Notes (Signed)
S: Pt seen on HD.  Breathing a little better O:BP 95/48  Pulse 102  Temp(Src) 96 F (35.6 C) (Axillary)  Resp 22  Ht 5\' 5"  (1.651 m)  Wt 64.3 kg (141 lb 12.1 oz)  BMI 23.59 kg/m2  SpO2 100%  Intake/Output Summary (Last 24 hours) at 02/06/12 0748 Last data filed at 02/06/12 0400  Gross per 24 hour  Intake    930 ml  Output    600 ml  Net    330 ml   Weight change: 3.1 kg (6 lb 13.4 oz) URK:YHCWC and alert.  On bipap BJS:EGBTD, reg Resp:bibasilar crackles Abd:+ BS NTND Ext:no edema.  RUA AVF + bruit NEURO:Ox3 CNI      . acidophilus  1 capsule Oral BID WC  . ipratropium  0.5 mg Nebulization Q4H   And  . albuterol  2.5 mg Nebulization Q4H  . albuterol      . albuterol      . aspirin  81 mg Oral Daily  . calcium acetate  667 mg Oral TID WC  . darbepoetin (ARANESP) injection - DIALYSIS  100 mcg Intravenous Q Wed-HD  . dextrose  25 mL Intravenous Once  . ferric gluconate (FERRLECIT/NULECIT) IV  62.5 mg Intravenous Weekly  . insulin aspart  0-9 Units Subcutaneous Q4H  . insulin aspart  20 Units Subcutaneous Once  . insulin glargine  10 Units Subcutaneous QHS  . levofloxacin (LEVAQUIN) IV  750 mg Intravenous Q24H  . levofloxacin (LEVAQUIN) IV  750 mg Intravenous Q M,W,F-2000  . levothyroxine  25 mcg Oral QAC breakfast  . LORazepam      . LORazepam  0.26 mg Intravenous Once  . methylPREDNISolone (SOLU-MEDROL) injection  80 mg Intravenous Once  . methylPREDNISolone (SOLU-MEDROL) injection  80 mg Intravenous Q8H  . methylPREDNISolone sodium succinate      . pantoprazole  40 mg Oral Daily  . paricalcitol  1 mcg Intravenous Q M,W,F-HD  . piperacillin-tazobactam (ZOSYN)  IV  2.25 g Intravenous Q8H  . potassium chloride  30 mEq Oral Once  . sodium chloride  3 mL Intravenous Q12H  . vancomycin  500 mg Intravenous Q M,W,F-HD  . DISCONTD: albuterol  2.5 mg Nebulization Q6H  . DISCONTD: albuterol  2.5 mg Nebulization TID  . DISCONTD: azithromycin  500 mg Intravenous Q24H  .  DISCONTD: cloNIDine  0.2 mg Oral TID  . DISCONTD: insulin aspart  0-20 Units Subcutaneous TID WC  . DISCONTD: insulin aspart  0-5 Units Subcutaneous QHS  . DISCONTD: insulin aspart  0-9 Units Subcutaneous TID WC  . DISCONTD: insulin aspart  5 Units Subcutaneous TID WC  . DISCONTD: insulin glargine  18 Units Subcutaneous QHS  . DISCONTD: ipratropium  0.5 mg Nebulization Q6H  . DISCONTD: ipratropium  0.5 mg Nebulization TID  . DISCONTD: isosorbide mononitrate  120 mg Oral Daily  . DISCONTD: levothyroxine  25 mcg Oral Daily  . DISCONTD: LORazepam  0.25 mg Oral Once  . DISCONTD: predniSONE  40 mg Oral QAC breakfast  . DISCONTD: sodium chloride  3 mL Intravenous Q12H   Dg Chest 2 View  02/04/2012  *RADIOLOGY REPORT*  Clinical Data: Recent pneumonia  CHEST - 2 VIEW  Comparison: 01/24/2012  Findings: Cardiomediastinal silhouette is stable.  Dual lumen right IJ catheter is unchanged in position.  There is a small right pleural effusion with right basilar atelectasis or infiltrate.  New patchy airspace disease right upper lobe perihilar suspicious for pneumonia. Left lung is clear.  No pulmonary  edema.  IMPRESSION:  There is a small right pleural effusion with right basilar atelectasis or infiltrate.  New patchy airspace disease right upper lobe perihilar suspicious for pneumonia.  Original Report Authenticated By: Natasha Mead, M.D.   Dg Chest Port 1 View  02/06/2012  *RADIOLOGY REPORT*  Clinical Data: Respiratory distress.  PORTABLE CHEST - 1 VIEW  Comparison: 02/04/2012  Findings: Enlarged heart size with prominent pulmonary vascularity. Interval development of increased perihilar air space disease and infiltration or atelectasis in the lung bases.  Small bilateral pleural effusions.  Changes suggest developing congestive failure or fluid overload.  Right central venous catheter remains unchanged in position.  Previously demonstrated right lung airspace disease is obscured by the more confluent process today.   No pneumothorax.  IMPRESSION: Increasing congestive changes in the heart and lungs with bilateral pleural effusions and diffuse pulmonary edema.  Original Report Authenticated By: Marlon Pel, M.D.   BMET    Component Value Date/Time   NA 123* 02/06/2012 0407   K 4.8 02/06/2012 0407   CL 84* 02/06/2012 0407   CO2 25 02/06/2012 0407   GLUCOSE 348* 02/06/2012 0407   BUN 35* 02/06/2012 0407   CREATININE 5.04* 02/06/2012 0407   CALCIUM 8.6 02/06/2012 0407   GFRNONAA 8* 02/06/2012 0407   GFRAA 9* 02/06/2012 0407   CBC    Component Value Date/Time   WBC 24.8* 02/06/2012 0407   RBC 3.62* 02/06/2012 0407   HGB 9.8* 02/06/2012 0407   HCT 31.5* 02/06/2012 0407   PLT 403* 02/06/2012 0407   MCV 87.0 02/06/2012 0407   MCH 27.1 02/06/2012 0407   MCHC 31.1 02/06/2012 0407   RDW 17.9* 02/06/2012 0407   LYMPHSABS 0.6* 02/06/2012 0407   MONOABS 0.7 02/06/2012 0407   EOSABS 0.0 02/06/2012 0407   BASOSABS 0.1 02/06/2012 0407     Assessment: 1. PNA 2. pulm edema 3. Sec HPTH 4. Anemia 5. ESRD Plan: 1. HD today  Trying to pull 4.5 liters 2. Recheck CXR in am to see if will need more hd over weekend 3. Resume nephrovite 4. Try to get off bipap today   Moxon Messler T

## 2012-02-06 NOTE — Consult Note (Signed)
Name: [name] [name] MRN: [id number] DOB: Mar 26, 1941    LOS: 2  PCCM CONSULTATION NOTE  History of Present Illness: 71 yo with COPD (on home oxygen), ESRD (on HD), DVT / PE (on Coumadin) and recent HCAP treatment admitted on 4/3 with worsening dyspnea and increased oxygen requirements.  On 4/5 developed respiratory distress and hypoxemic hypercarbic respiratory failure - transferred to ICU for farther management.  Lines / Drains: 12/20  DH catheter  Cultures: 4/3  Blood  Antibiotics: 4/3  Vancomycin 4/3  Zosyn 4/3  Azithromycin >>> 4/5 4/5  Leviaquin  Tests / Events: 4/5  Hypoxic hypercarbic respiratory failure, pulmonary edema, BiPAP started, urgent HD pending  The patient is in respiratory distress, on BiPAP, unable to provide history, which was obtained for available medical records.    Past Medical History  Diagnosis Date  . Diabetes mellitus   . Hypercholesterolemia   . Hypothyroidism   . Colitis   . Chronic kidney disease   . Coronary artery disease   . Hypertension   . GERD (gastroesophageal reflux disease)   . Myocardial infarction   . Peripheral vascular disease   . COPD (chronic obstructive pulmonary disease)   . Pneumonia   . Angina   . Shortness of breath    Past Surgical History  Procedure Date  . Partial hysterectomy   . Tubal ligation   . Amputaton of right fifth toe   . Tonsillectomy   . Insertion of dialysis catheter 10/23/2011    Procedure: INSERTION OF DIALYSIS CATHETER;  Surgeon: Chuck Hint, MD;  Location: [facility] OR;  Service: Vascular;  Laterality: Right;  Right internal jugular Insertion diatek catheter   Prior to Admission medications   Medication Sig Start Date End Date Taking? Authorizing Provider  amLODipine (NORVASC) 10 MG tablet Take 10 mg by mouth daily.     Yes Historical Provider, MD  aspirin 81 MG chewable tablet Chew 1 tablet (81 mg total) by mouth daily. 11/01/11 10/31/12 Yes Clydia Llano, MD  calcium acetate (PHOSLO) 667 MG  capsule Take 1 capsule (667 mg total) by mouth 3 (three) times daily with meals. 11/01/11 10/31/12 Yes Clydia Llano, MD  cloNIDine (CATAPRES) 0.2 MG tablet Take 0.2 mg by mouth 3 (three) times daily.     Yes Historical Provider, MD  insulin glargine (LANTUS) 100 UNIT/ML injection Inject 18 Units into the skin at bedtime. 11/01/11  Yes Clydia Llano, MD  isosorbide mononitrate (IMDUR) 120 MG 24 hr tablet Take 120 mg by mouth daily.     Yes Historical Provider, MD  levothyroxine (SYNTHROID, LEVOTHROID) 25 MCG tablet Take 25 mcg by mouth daily.     Yes Historical Provider, MD  pantoprazole (PROTONIX) 40 MG tablet Take 40 mg by mouth daily.     Yes Historical Provider, MD  Probiotic Product (PROBIOTIC PO) Take 1 capsule by mouth daily.   Yes Historical Provider, MD  ranitidine (ZANTAC) 150 MG tablet Take 150 mg by mouth 2 (two) times daily.     Yes Historical Provider, MD  warfarin (COUMADIN) 2 MG tablet Take 2 mg by mouth daily.   Yes Historical Provider, MD  nitroGLYCERIN (NITROSTAT) 0.4 MG SL tablet Place 0.4 mg under the tongue every 5 (five) minutes as needed. For chest pain.     Historical Provider, MD  oxyCODONE-acetaminophen (PERCOCET) 5-325 MG per tablet Take 1 tablet by mouth every 6 (six) hours as needed. For pain.    Historical Provider, MD   Allergies Allergies  Allergen Reactions  .  Novocain Other (See Comments)    Unknown   Family History History reviewed. No pertinent family history.  Social History  reports that she quit smoking about 7 months ago. Her smoking use included Cigarettes. She has a 40 pack-year smoking history. She does not have any smokeless tobacco history on file. She reports that she does not drink alcohol or use illicit drugs.  Review Of Systems  Patient unable to provide  Vital Signs: Temp:  [97.4 F (36.3 C)-98.3 F (36.8 C)] 97.4 F (36.3 C) (04/04 2048) Pulse Rate:  [80-120] 120  (04/05 0150) Resp:  [18-26] 24  (04/05 0150) BP: (109-166)/(61-80)  134/77 mmHg (04/05 0150) SpO2:  [74 %-95 %] 95 % (04/05 0150) Weight:  [62.9 kg (138 lb 10.7 oz)] 62.9 kg (138 lb 10.7 oz) (04/04 2250) I/O last 3 completed shifts: In: 960 [P.O.:960] Out: 600 [Urine:600]  Physical Examination: General:  Increased work of breathing, accessory muscle use Neuro:  Awake and alert HEENT: Moist membranes Neck:  Supple, moderate JVD Cardiovascular:  Regular rhythm, rapid rate, distant sounds  Lungs:  Diminished air entry R>L, expiratory wheezes Abdomen:  Soft, nontender, nondistended, bowel sounds present Musculoskeletal:  Moves all extremities, trace pedal edema Skin:  No rash  Ventilator settings:    Labs and Imaging:  Reviewed.  Please refer to the Assessment and Plan section for relevant results.  ASSESSMENT AND PLAN  NEUROLOGIC A:  No active issues P: -->  D/c Ambien / Oxycodone as exacerbate respiratory failure  PULMONARY  Lab 02/06/12 0130  PHART 7.227*  PCO2ART 64.8*  PO2ART 83.4  HCO3 26.0*  O2SAT 93.4   A:  Acute on chronic hypercarbic hypoxemic respiratory failure.  Acute pulmonary edema with bilateral pleural effusions.  Suspected HCAP.  Suspected acute exacerbation of COPD.  History of PE. P: -->  BiPAP -->  Goal pH>7.30, goal SpO2>92 -->  Follow up ABG -->  Bronchodilators -->  Solu-Medrol -->  Antibiotics per ID section -->  Fluid management per Renal section  CARDIOVASCULAR  Lab 02/05/12 0940 02/05/12 0256 02/04/12 1933 02/04/12 1528 02/04/12 1448  TROPONINI <0.30 <0.30 <0.30 -- <0.30  LATICACIDVEN -- -- -- 1.3 --  PROBNP -- -- -- -- --   A:  Sinus tachycardia.  No evidence of ischemia.  Hemodynamically stable.  History of HTN. P: -->  12 lead ECG -->  Cardiac enzymes -->  Hold antihypertensives to avoid hypotension during urgent HD  RENAL  Lab 02/05/12 0300 02/04/12 1406  NA 132* 136  K 3.2* 3.7  CL 92* 96  CO2 30 --  BUN 19 10  CREATININE 3.36* 2.30*  CALCIUM 8.5 --  MG -- --  PHOS -- --   A:   ESRD on HD.  Fluid overload with acute pulmonary edema.  Mild hypokalemia. P: -->  Renal following -->  Will attempt to arrange for urgent HD tonight  GASTROINTESTINAL  Lab 02/05/12 0300  AST 13  ALT 11  ALKPHOS 115  BILITOT 0.2*  PROT 6.5  ALBUMIN 2.4*   A:  Malnutrition. P: -->  Diet when able to tolerate.  HEMATOLOGIC  Lab 02/05/12 1945 02/05/12 0940 02/05/12 0300 02/04/12 1934 02/04/12 1515 02/04/12 1500 02/04/12 1406  HGB 9.9* -- 9.5* 9.5* 10.8* -- 12.2  HCT 32.1* -- 30.6* 31.2* 34.7* -- 36.0  PLT -- -- 363 -- 379 -- --  INR -- 4.69* 5.60* -- -- 5.18* --  APTT -- -- 129* -- -- -- --   A:  Stable anemia.  Coumadin-induced coagulopathy.  No overt hemorrhage.  History of DVT/PE. P: -->  Coumadin per Pharmacy -->  Aranesp   INFECTIOUS  Lab 02/05/12 0300 02/04/12 1515  WBC 12.3* 14.4*  PROCALCITON -- --   A:  Suspected HCAP. P: -->  Continue Vancomycin / Zosyn -->  D/c Azithromycin -->  Start Levaquin -->  Await blood Cx + PCT level  ENDOCRINE  Lab 02/06/12 0113 02/06/12 0014 02/05/12 2039 02/05/12 1648 02/05/12 1500  GLUCAP 367* 385* 388* 472* 565*   A:  DM with hyperglycemia, likely exacerbated by steroids.  Hypothyroidism. P: -->  Insulin IV per Glucostabilizer  -->  D/c Lantus / SSI -->  Synthroid  BEST PRACTICE / DISPOSITION -->  ICU status under PCCM -->  Full code -->  NPO -->  DVT Px is not indicated (therapeutic Coumadin) -->  Protonix IV for GI Px -->  Family updated by [id number] nursing  The patient is critically ill with multiple organ systems failure and requires high complexity decision making for assessment and support, frequent evaluation and titration of therapies, application of advanced monitoring technologies and extensive interpretation of multiple databases. Critical Care Time devoted to patient care services described in this note is 55 minutes.  Orlean Bradford, M.D., F.C.C.P. Pulmonary and Critical Care Medicine [facility] [place] Cell: [phone] Pager: [phone]  02/06/2012, 2:23 AM

## 2012-02-06 NOTE — Progress Notes (Signed)
ANTICOAGULATION CONSULT NOTE - Initial Consult  Pharmacy Consult for Coumadin Indication: H/O VTE  Allergies  Allergen Reactions  . Novocain Other (See Comments)    Unknown    Patient Measurements: Height: 5\' 5"  (165.1 cm) Weight: 141 lb 12.1 oz (64.3 kg) IBW/kg (Calculated) : 57   Vital Signs: Temp: 97.4 F (36.3 C) (04/05 1204) Temp src: Axillary (04/05 1204) BP: 117/47 mmHg (04/05 1100) Pulse Rate: 103  (04/05 1100)  Labs:  Basename 02/06/12 0920 02/06/12 0407 02/05/12 1945 02/05/12 0940 02/05/12 0300 02/04/12 1515 02/04/12 1406  HGB -- 9.8* 9.9* -- -- -- --  HCT -- 31.5* 32.1* -- 30.6* -- --  PLT -- 403* -- -- 363 379 --  APTT -- -- -- -- 129* -- --  LABPROT -- 23.2* -- 44.8* 51.5* -- --  INR -- 2.02* -- 4.69* 5.60* -- --  HEPARINUNFRC -- -- -- -- -- -- --  CREATININE -- 5.04* -- -- 3.36* -- 2.30*  CKTOTAL 34 24 -- 20 -- -- --  CKMB 2.4 1.8 -- 0.7 -- -- --  TROPONINI 1.54* <0.30 -- <0.30 -- -- --   Estimated Creatinine Clearance: 9.6 ml/min (by C-G formula based on Cr of 5.04).  Medical History: Past Medical History  Diagnosis Date  . Diabetes mellitus   . Hypercholesterolemia   . Hypothyroidism   . Colitis   . Chronic kidney disease   . Coronary artery disease   . Hypertension   . GERD (gastroesophageal reflux disease)   . Myocardial infarction   . Peripheral vascular disease   . COPD (chronic obstructive pulmonary disease)   . Pneumonia   . Angina   . Shortness of breath     Medications:  Scheduled:    . acidophilus  1 capsule Oral BID WC  . ipratropium  0.5 mg Nebulization Q4H   And  . albuterol  2.5 mg Nebulization Q4H  . albuterol      . albuterol      . aspirin  81 mg Oral Daily  . calcium acetate  667 mg Oral TID WC  . darbepoetin (ARANESP) injection - DIALYSIS  100 mcg Intravenous Q Wed-HD  . dextrose  25 mL Intravenous Once  . ferric gluconate (FERRLECIT/NULECIT) IV  62.5 mg Intravenous Weekly  . insulin aspart  0-9 Units  Subcutaneous Q4H  . insulin aspart  20 Units Subcutaneous Once  . insulin glargine  10 Units Subcutaneous QHS  . levofloxacin  750 mg Oral Q M,W,F-2000  . levothyroxine  25 mcg Oral QAC breakfast  . LORazepam      . LORazepam  0.26 mg Intravenous Once  . methylPREDNISolone (SOLU-MEDROL) injection  80 mg Intravenous Once  . methylPREDNISolone (SOLU-MEDROL) injection  80 mg Intravenous Q8H  . methylPREDNISolone sodium succinate      . multivitamin  1 tablet Oral QHS  . pantoprazole  40 mg Oral Daily  . paricalcitol  1 mcg Intravenous Q M,W,F-HD  . sodium chloride  3 mL Intravenous Q12H  . warfarin  2 mg Oral ONCE-1800  . Warfarin - Pharmacist Dosing Inpatient   Does not apply q1800  . DISCONTD: albuterol  2.5 mg Nebulization TID  . DISCONTD: azithromycin  500 mg Intravenous Q24H  . DISCONTD: cloNIDine  0.2 mg Oral TID  . DISCONTD: insulin aspart  0-20 Units Subcutaneous TID WC  . DISCONTD: insulin aspart  0-5 Units Subcutaneous QHS  . DISCONTD: insulin aspart  5 Units Subcutaneous TID WC  . DISCONTD: insulin glargine  18 Units Subcutaneous QHS  . DISCONTD: ipratropium  0.5 mg Nebulization TID  . DISCONTD: isosorbide mononitrate  120 mg Oral Daily  . DISCONTD: levofloxacin (LEVAQUIN) IV  750 mg Intravenous Q24H  . DISCONTD: levofloxacin (LEVAQUIN) IV  750 mg Intravenous Q M,W,F-2000  . DISCONTD: levothyroxine  25 mcg Oral Daily  . DISCONTD: LORazepam  0.25 mg Oral Once  . DISCONTD: piperacillin-tazobactam (ZOSYN)  IV  2.25 g Intravenous Q8H  . DISCONTD: predniSONE  40 mg Oral QAC breakfast  . DISCONTD: sodium chloride  3 mL Intravenous Q12H  . DISCONTD: vancomycin  500 mg Intravenous Q M,W,F-HD   Infusions:    . DISCONTD: insulin (NOVOLIN-R) infusion      Assessment: 71 y.o. female admitted on 02/04/2012, with HCAP. 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 INR 4.69 on admit, and patient has since received 5mg  phytonadione. INR currently 2.02  (lower end of therapeutic range). Will restart coumadin and monitor for need for lower doses.  Goal of Therapy:  INR 2-3   Plan:  1) Coumadin 2 mg PO x 1 dose tonight 2) F/U daily INR and signs for bleeding  Janace Litten 02/06/2012,12:52 PM

## 2012-02-06 NOTE — Progress Notes (Signed)
Glycemic Control Recommendations     Elevated glucose primarily due to IV steroids:  Results for [name] [name], [name] [name] (MRN [id number]) as of 02/06/2012 07:36  Ref. Range 02/05/2012 13:41 02/05/2012 15:00 02/05/2012 16:48 02/05/2012 20:39 02/06/2012 00:14 02/06/2012 01:13 02/06/2012 03:35 02/06/2012 05:05  Glucose-Capillary Latest Range: 70-99 mg/dL >401 (HH) 027 (HH) 253 (H) 388 (H) 385 (H) 367 (H) 341 (H) 279 (H)    Recommendations:     - Increase Lantus to at least home dose:  Lantus 18 units     - May consider increasing to Moderate Novolog Correction q4hrs while on IV steroids

## 2012-02-07 ENCOUNTER — Inpatient Hospital Stay (HOSPITAL_COMMUNITY): Payer: Medicare Other

## 2012-02-07 LAB — RENAL FUNCTION PANEL
Albumin: 2.7 g/dL — ABNORMAL LOW (ref 3.5–5.2)
CO2: 22 mEq/L (ref 19–32)
Calcium: 9.5 mg/dL (ref 8.4–10.5)
Creatinine, Ser: 4.01 mg/dL — ABNORMAL HIGH (ref 0.50–1.10)
GFR calc non Af Amer: 11 mL/min — ABNORMAL LOW (ref >90)
Phosphorus: 3.2 mg/dL (ref 2.3–4.6)

## 2012-02-07 LAB — CARDIAC PANEL(CRET KIN+CKTOT+MB+TROPI): Total CK: 42 U/L (ref 7–177)

## 2012-02-07 LAB — GLUCOSE, CAPILLARY
Glucose-Capillary: 217 mg/dL — ABNORMAL HIGH (ref 70–99)
Glucose-Capillary: 361 mg/dL — ABNORMAL HIGH (ref 70–99)

## 2012-02-07 LAB — CBC
Hemoglobin: 10.5 g/dL — ABNORMAL LOW (ref 12.0–15.0)
MCH: 27.7 pg (ref 26.0–34.0)
MCHC: 31.4 g/dL (ref 30.0–36.0)
RDW: 19.1 % — ABNORMAL HIGH (ref 11.5–15.5)

## 2012-02-07 LAB — PROTIME-INR
INR: 1.85 — ABNORMAL HIGH (ref 0.00–1.49)
Prothrombin Time: 21.7 seconds — ABNORMAL HIGH (ref 11.6–15.2)

## 2012-02-07 MED ORDER — WARFARIN SODIUM 2 MG PO TABS
2.0000 mg | ORAL_TABLET | Freq: Once | ORAL | Status: AC
  Administered 2012-02-07: 2 mg via ORAL
  Filled 2012-02-07: qty 1

## 2012-02-07 NOTE — Consult Note (Signed)
Admit date: 02/04/2012 Referring Physician  Dr. Blake Divine Primary Physician  Dr. Quintella Reichert Primary Cardiologist Dr. Dietrich Pates Reason for Consultation  Abnormal cardiac enzymes  HPI: 71 year old lady with h/o copd on oxygen at home, who was recently discharged from the hospital 10 days ago after being treated for health care associated pneumonia and UTI. She reports shortness of breath, and worsening cough since 2 days. On arrival to ED , she was found to have oxygen saturations in low 80%'s. She was put on Non rebreather and her oxygen sats improved. 100% oxygen was removed and she was saturating 95% on 3 liters of nasal canula. cxr showed new pneumonia on the right upper lobe and possible atelectasis and pneumonia at the right lower base. She was started on iv antibiotics regimen for HCAP.  We are asked to see the patient for evaluation of elevated cardiac enzymes.      PMH:   Past Medical History  Diagnosis Date  . Diabetes mellitus   . Hypercholesterolemia   . Hypothyroidism   . Colitis   . Chronic kidney disease   . Coronary artery disease   . Hypertension   . GERD (gastroesophageal reflux disease)   . Myocardial infarction   . Peripheral vascular disease   . COPD (chronic obstructive pulmonary disease)   . Pneumonia   . Angina   . Shortness of breath      PSH:   Past Surgical History  Procedure Date  . Partial hysterectomy   . Tubal ligation   . Amputaton of right fifth toe   . Tonsillectomy   . Insertion of dialysis catheter 10/23/2011    Procedure: INSERTION OF DIALYSIS CATHETER;  Surgeon: Chuck Hint, MD;  Location: [facility] OR;  Service: Vascular;  Laterality: Right;  Right internal jugular Insertion diatek catheter    Allergies:  Novocain Prior to Admit Meds:   Prescriptions prior to admission  Medication Sig Dispense Refill  . amLODipine (NORVASC) 10 MG tablet Take 10 mg by mouth daily.        Marland Kitchen aspirin 81 MG chewable tablet Chew 1 tablet (81 mg total) by mouth  daily.      . calcium acetate (PHOSLO) 667 MG capsule Take 1 capsule (667 mg total) by mouth 3 (three) times daily with meals.      . cloNIDine (CATAPRES) 0.2 MG tablet Take 0.2 mg by mouth 3 (three) times daily.        . insulin glargine (LANTUS) 100 UNIT/ML injection Inject 18 Units into the skin at bedtime.  10 mL    . isosorbide mononitrate (IMDUR) 120 MG 24 hr tablet Take 120 mg by mouth daily.        Marland Kitchen levothyroxine (SYNTHROID, LEVOTHROID) 25 MCG tablet Take 25 mcg by mouth daily.        . pantoprazole (PROTONIX) 40 MG tablet Take 40 mg by mouth daily.        . Probiotic Product (PROBIOTIC PO) Take 1 capsule by mouth daily.      . ranitidine (ZANTAC) 150 MG tablet Take 150 mg by mouth 2 (two) times daily.        Marland Kitchen warfarin (COUMADIN) 2 MG tablet Take 2 mg by mouth daily.      . nitroGLYCERIN (NITROSTAT) 0.4 MG SL tablet Place 0.4 mg under the tongue every 5 (five) minutes as needed. For chest pain.       Marland Kitchen oxyCODONE-acetaminophen (PERCOCET) 5-325 MG per tablet Take 1 tablet by mouth every  6 (six) hours as needed. For pain.       Fam HX:   History reviewed. No pertinent family history. Social HX:    History   Social History  . Marital Status: Widowed    Spouse Name: N/A    Number of Children: N/A  . Years of Education: N/A   Occupational History  . Not on file.   Social History Main Topics  . Smoking status: Former Smoker -- 1.0 packs/day for 40 years    Types: Cigarettes    Quit date: 06/11/2011  . Smokeless tobacco: Not on file  . Alcohol Use: No  . Drug Use: No  . Sexually Active: Not Currently    Birth Control/ Protection: Post-menopausal   Other Topics Concern  . Not on file   Social History Narrative  . No narrative on file     ROS:  All 11 ROS were addressed and are negative except what is stated in the HPI  Physical Exam: Blood pressure 109/57, pulse 101, temperature 97.4 F (36.3 C), temperature source Oral, resp. rate 20, height 5\' 5"  (1.651 m), weight  59.7 kg (131 lb 9.8 oz), SpO2 96.00%.    General: Well developed, well nourished, in no acute distress Head: Eyes PERRLA, No xanthomas.   Normal cephalic and atramatic  Lungs:   Clear bilaterally to auscultation and percussion. Heart:   HRRR S1 S2 Pulses are 2+ & equal. Abdomen: Bowel sounds are positive, abdomen soft and non-tender without masses\ Extremities:   No clubbing, cyanosis or edema.  DP +1 Neuro: Alert and oriented X 3. Psych:  Good affect, responds appropriately    Labs:   Lab Results  Component Value Date   WBC 26.3* 02/07/2012   HGB 10.5* 02/07/2012   HCT 33.4* 02/07/2012   MCV 88.1 02/07/2012   PLT 484* 02/07/2012    Lab 02/06/12 0407  NA 123*  K 4.8  CL 84*  CO2 25  BUN 35*  CREATININE 5.04*  CALCIUM 8.6  PROT 7.0  BILITOT 0.2*  ALKPHOS 135*  ALT 16  AST 21  GLUCOSE 348*   No results found for this basename: PTT   Lab Results  Component Value Date   INR 1.85* 02/07/2012   INR 2.02* 02/06/2012   INR 4.69* 02/05/2012   Lab Results  Component Value Date   CKTOTAL 42 02/07/2012   CKMB 4.5* 02/07/2012   TROPONINI 3.81* 02/07/2012         Radiology:  Dg Chest 2 View  02/06/2012  *RADIOLOGY REPORT*  Clinical Data: Short of breath, pulmonary edema, former smoking history, diabetes  CHEST - 2 VIEW  Comparison: Portable chest x-ray of 02/06/2012  Findings: There has been some improvement in the edema pattern. Mild pulmonary vascular congestion remains with small pleural effusions.  Cardiomegaly is stable.  The right central venous line is unchanged with the tips in the lower SVC near the expected right atrial junction.  IMPRESSION: Improvement in edema pattern.  Small effusions and mild cardiomegaly remain.  Original Report Authenticated By: Juline Patch, M.D.   Dg Chest Port 1 View  02/07/2012  *RADIOLOGY REPORT*  Clinical Data: 71 year old female with pulmonary edema.  Acute-on- chronic respiratory failure.  Bilateral pleural effusions.  PORTABLE CHEST - 1 VIEW   Comparison: 02/06/2012 and earlier.  Findings: Portable semi upright AP view 0708 hours.  Stable right IJ approach dual lumen dialysis type catheter.  Stable cardiac size and mediastinal contours.  Worsening airspace opacity at both lung  bases.  Superimposed veiling opacity compatible with small to moderate effusions.  No pneumothorax.  Vascular congestion is increased.  IMPRESSION: Interval worsening vascular congestion/interstitial edema, small to moderate bilateral effusions, and bibasilar ventilation.  Original Report Authenticated By: Harley Hallmark, M.D.   Dg Chest Port 1 View  02/06/2012  *RADIOLOGY REPORT*  Clinical Data: Respiratory distress.  PORTABLE CHEST - 1 VIEW  Comparison: 02/04/2012  Findings: Enlarged heart size with prominent pulmonary vascularity. Interval development of increased perihilar air space disease and infiltration or atelectasis in the lung bases.  Small bilateral pleural effusions.  Changes suggest developing congestive failure or fluid overload.  Right central venous catheter remains unchanged in position.  Previously demonstrated right lung airspace disease is obscured by the more confluent process today.  No pneumothorax.  IMPRESSION: Increasing congestive changes in the heart and lungs with bilateral pleural effusions and diffuse pulmonary edema.  Original Report Authenticated By: Marlon Pel, M.D.    EKG:  NSR with LVH and repolarization abnormality  ASSESSMENT:  1.  Abnormal cardiac enzymes primarily elevated troponin c/w NSTEMI most likely type II from demand ischemia from hypoxia in the setting of renal failure - normal LVF by echo 10/2011 2.  Acute on chronic respiratory failure secondary to hospital acquired pneumonia with a component of COPD exacerbation and pulmonary edema from volume overload due to CKD. 3.  DM 4.  HTN 5.  ESRD on HD 6.  Systemic anticoagulation 7.  CAD with history of MI 8.  Volume overload due to CKD s/p emergent HD - EF on echo  10/2011 normal  PLAN:   1.  Continue current medical therapy with nitrates/ASA 2.  No beta blocker due to COPD exacerbation  3.  Recheck echo to rule out new regional wall motion abnormality 4.  Volume management per renal med  Quintella Reichert, MD  02/07/2012  1:19 PM

## 2012-02-07 NOTE — Progress Notes (Signed)
Subjective:  no chest pain or sob.   Objective: Weight change: -1.9 kg (-4 lb 3 oz)  Intake/Output Summary (Last 24 hours) at 02/07/12 1552 Last data filed at 02/07/12 1300  Gross per 24 hour  Intake    120 ml  Output   2880 ml  Net  -2760 ml    Filed Vitals:   02/07/12 1343  BP: 97/53  Pulse: 101  Temp: 97.4 F (36.3 C)  Resp: 19  General: Well developed, well nourished, in no acute distress Lungs: Clear bilaterally to auscultation  Heart: RRR S1 S2 Pulses are 2+ & equal.  Abdomen: Bowel sounds are present, abdomen soft and non-tender  Extremities: 1+ edema.     Lab Results: Results for orders placed during the hospital encounter of 02/04/12 (from the past 24 hour(s))  GLUCOSE, CAPILLARY     Status: Abnormal   Collection Time   02/06/12  4:21 PM      Component Value Range   Glucose-Capillary 358 (*) 70 - 99 (mg/dL)  GLUCOSE, CAPILLARY     Status: Abnormal   Collection Time   02/06/12  7:56 PM      Component Value Range   Glucose-Capillary 277 (*) 70 - 99 (mg/dL)  CARDIAC PANEL(CRET KIN+CKTOT+MB+TROPI)     Status: Abnormal   Collection Time   02/06/12 10:20 PM      Component Value Range   Total CK 26  7 - 177 (U/L)   CK, MB 2.7  0.3 - 4.0 (ng/mL)   Troponin I 1.39 (*) <0.30 (ng/mL)   Relative Index RELATIVE INDEX IS INVALID  0.0 - 2.5   GLUCOSE, CAPILLARY     Status: Abnormal   Collection Time   02/06/12 10:26 PM      Component Value Range   Glucose-Capillary 300 (*) 70 - 99 (mg/dL)   Comment 1 Notify RN    GLUCOSE, CAPILLARY     Status: Abnormal   Collection Time   02/07/12 12:19 AM      Component Value Range   Glucose-Capillary 361 (*) 70 - 99 (mg/dL)   Comment 1 Documented in Chart     Comment 2 Notify RN    GLUCOSE, CAPILLARY     Status: Abnormal   Collection Time   02/07/12  4:00 AM      Component Value Range   Glucose-Capillary 296 (*) 70 - 99 (mg/dL)   Comment 1 Documented in Chart     Comment 2 Notify RN    GLUCOSE, CAPILLARY     Status: Normal   Collection Time   02/07/12  8:20 AM      Component Value Range   Glucose-Capillary 94  70 - 99 (mg/dL)  PROTIME-INR     Status: Abnormal   Collection Time   02/07/12  9:30 AM      Component Value Range   Prothrombin Time 21.7 (*) 11.6 - 15.2 (seconds)   INR 1.85 (*) 0.00 - 1.49   RENAL FUNCTION PANEL     Status: Abnormal   Collection Time   02/07/12  9:30 AM      Component Value Range   Sodium 131 (*) 135 - 145 (mEq/L)   Potassium 5.0  3.5 - 5.1 (mEq/L)   Chloride 93 (*) 96 - 112 (mEq/L)   CO2 22  19 - 32 (mEq/L)   Glucose, Bld 141 (*) 70 - 99 (mg/dL)   BUN 42 (*) 6 - 23 (mg/dL)   Creatinine, Ser 1.61 (*) 0.50 -  1.10 (mg/dL)   Calcium 9.5  8.4 - 36.6 (mg/dL)   Phosphorus 3.2  2.3 - 4.6 (mg/dL)   Albumin 2.7 (*) 3.5 - 5.2 (g/dL)   GFR calc non Af Amer 11 (*) >90 (mL/min)   GFR calc Af Amer 12 (*) >90 (mL/min)  CBC     Status: Abnormal   Collection Time   02/07/12  9:30 AM      Component Value Range   WBC 26.3 (*) 4.0 - 10.5 (K/uL)   RBC 3.79 (*) 3.87 - 5.11 (MIL/uL)   Hemoglobin 10.5 (*) 12.0 - 15.0 (g/dL)   HCT 44.0 (*) 34.7 - 46.0 (%)   MCV 88.1  78.0 - 100.0 (fL)   MCH 27.7  26.0 - 34.0 (pg)   MCHC 31.4  30.0 - 36.0 (g/dL)   RDW 42.5 (*) 95.6 - 15.5 (%)   Platelets 484 (*) 150 - 400 (K/uL)  CARDIAC PANEL(CRET KIN+CKTOT+MB+TROPI)     Status: Abnormal   Collection Time   02/07/12  9:30 AM      Component Value Range   Total CK 42  7 - 177 (U/L)   CK, MB 4.5 (*) 0.3 - 4.0 (ng/mL)   Troponin I 3.81 (*) <0.30 (ng/mL)   Relative Index RELATIVE INDEX IS INVALID  0.0 - 2.5   GLUCOSE, CAPILLARY     Status: Abnormal   Collection Time   02/07/12  1:15 PM      Component Value Range   Glucose-Capillary 160 (*) 70 - 99 (mg/dL)     Micro Results: Recent Results (from the past 240 hour(s))  CULTURE, BLOOD (ROUTINE X 2)     Status: Normal (Preliminary result)   Collection Time   02/04/12  3:40 PM      Component Value Range Status Comment   Specimen Description BLOOD FINGER LEFT   Final     Special Requests BOTTLES DRAWN AEROBIC ONLY 4CC   Final    Culture  Setup Time [id number]   Final    Culture     Final    Value:        BLOOD CULTURE RECEIVED NO GROWTH TO DATE CULTURE WILL BE HELD FOR 5 DAYS BEFORE ISSUING A FINAL NEGATIVE REPORT   Report Status PENDING   Incomplete   CULTURE, BLOOD (ROUTINE X 2)     Status: Normal (Preliminary result)   Collection Time   02/04/12  4:00 PM      Component Value Range Status Comment   Specimen Description BLOOD ARM LEFT   Final    Special Requests BOTTLES DRAWN AEROBIC ONLY 8CC   Final    Culture  Setup Time [id number]   Final    Culture     Final    Value:        BLOOD CULTURE RECEIVED NO GROWTH TO DATE CULTURE WILL BE HELD FOR 5 DAYS BEFORE ISSUING A FINAL NEGATIVE REPORT   Report Status PENDING   Incomplete   MRSA PCR SCREENING     Status: Normal   Collection Time   02/06/12  2:25 AM      Component Value Range Status Comment   MRSA by PCR NEGATIVE  NEGATIVE  Final     Studies/Results: Dg Chest 2 View  02/06/2012  *RADIOLOGY REPORT*  Clinical Data: Short of breath, pulmonary edema, former smoking history, diabetes  CHEST - 2 VIEW  Comparison: Portable chest x-ray of 02/06/2012  Findings: There has been some improvement in the edema pattern. Mild  pulmonary vascular congestion remains with small pleural effusions.  Cardiomegaly is stable.  The right central venous line is unchanged with the tips in the lower SVC near the expected right atrial junction.  IMPRESSION: Improvement in edema pattern.  Small effusions and mild cardiomegaly remain.  Original Report Authenticated By: Juline Patch, M.D.   Dg Chest 2 View  02/04/2012  *RADIOLOGY REPORT*  Clinical Data: Recent pneumonia  CHEST - 2 VIEW  Comparison: 01/24/2012  Findings: Cardiomediastinal silhouette is stable.  Dual lumen right IJ catheter is unchanged in position.  There is a small right pleural effusion with right basilar atelectasis or infiltrate.  New patchy airspace disease  right upper lobe perihilar suspicious for pneumonia. Left lung is clear.  No pulmonary edema.  IMPRESSION:  There is a small right pleural effusion with right basilar atelectasis or infiltrate.  New patchy airspace disease right upper lobe perihilar suspicious for pneumonia.  Original Report Authenticated By: Natasha Mead, M.D.   Dg Chest Port 1 View  02/07/2012  *RADIOLOGY REPORT*  Clinical Data: 71 year old female with pulmonary edema.  Acute-on- chronic respiratory failure.  Bilateral pleural effusions.  PORTABLE CHEST - 1 VIEW  Comparison: 02/06/2012 and earlier.  Findings: Portable semi upright AP view 0708 hours.  Stable right IJ approach dual lumen dialysis type catheter.  Stable cardiac size and mediastinal contours.  Worsening airspace opacity at both lung bases.  Superimposed veiling opacity compatible with small to moderate effusions.  No pneumothorax.  Vascular congestion is increased.  IMPRESSION: Interval worsening vascular congestion/interstitial edema, small to moderate bilateral effusions, and bibasilar ventilation.  Original Report Authenticated By: Harley Hallmark, M.D.   Dg Chest Port 1 View  02/06/2012  *RADIOLOGY REPORT*  Clinical Data: Respiratory distress.  PORTABLE CHEST - 1 VIEW  Comparison: 02/04/2012  Findings: Enlarged heart size with prominent pulmonary vascularity. Interval development of increased perihilar air space disease and infiltration or atelectasis in the lung bases.  Small bilateral pleural effusions.  Changes suggest developing congestive failure or fluid overload.  Right central venous catheter remains unchanged in position.  Previously demonstrated right lung airspace disease is obscured by the more confluent process today.  No pneumothorax.  IMPRESSION: Increasing congestive changes in the heart and lungs with bilateral pleural effusions and diffuse pulmonary edema.  Original Report Authenticated By: Marlon Pel, M.D.   Portable Chest Xray In Am  01/24/2012   *RADIOLOGY REPORT*  Clinical Data: Pneumonia  PORTABLE CHEST - 1 VIEW  Comparison: 01/23/2012; 10/23/2011; chest CT - 10/22/2011  Findings: Unchanged cardiac silhouette and mediastinal contours. Stable positioning of support apparatus.  Grossly unchanged right mid and lower lung heterogeneous air space opacities.  Grossly unchanged left basilar/retrocardiac heterogeneous opacities.  The pulmonary vasculature remains indistinct.  Persistent blunting of the right costophrenic angle suggesting a small right-sided pleural effusion. No pneumothorax.  Unchanged bones.  IMPRESSION: 1.  Grossly unchanged right mid and lower lung heterogeneous air space opacity worrisome for infection. 2.  Grossly unchanged left basilar/retrocardiac opacity, possibly atelectasis. 3.  Unchanged findings of mild pulmonary edema and small right- sided effusion.  Original Report Authenticated By: Waynard Reeds, M.D.   Portable Chest Xray  01/23/2012  *RADIOLOGY REPORT*  Clinical Data: Increased shortness of breath  PORTABLE CHEST - 1 VIEW  Comparison: Portable chest x-ray of 01/23/2012  Findings: There has been some improvement in airspace disease at the right lung base and in the right mid lung.  A small right effusion remains.  Mild cardiomegaly is  stable.  There is still evidence of mild pulmonary vascular congestion.  Right central venous line is unchanged in position.  IMPRESSION: Some improvement in airspace disease.  Persistent cardiomegaly, right effusion, and mild pulmonary vascular congestion.  Original Report Authenticated By: Juline Patch, M.D.   Medications: Scheduled Meds:   . acidophilus  1 capsule Oral BID WC  . albuterol  2.5 mg Nebulization TID   And  . ipratropium  0.5 mg Nebulization TID  . aspirin  81 mg Oral Daily  . calcium acetate  667 mg Oral TID WC  . darbepoetin (ARANESP) injection - DIALYSIS  100 mcg Intravenous Q Wed-HD  . ferric gluconate (FERRLECIT/NULECIT) IV  62.5 mg Intravenous Weekly  .  insulin aspart  0-9 Units Subcutaneous Q4H  . insulin glargine  10 Units Subcutaneous QHS  . levofloxacin  750 mg Oral Q M,W,F-2000  . levothyroxine  25 mcg Oral QAC breakfast  . multivitamin  1 tablet Oral QHS  . pantoprazole  40 mg Oral Daily  . paricalcitol  1 mcg Intravenous Q M,W,F-HD  . sodium chloride  3 mL Intravenous Q12H  . warfarin  2 mg Oral ONCE-1800  . warfarin  2 mg Oral ONCE-1800  . Warfarin - Pharmacist Dosing Inpatient   Does not apply q1800  . DISCONTD: albuterol  2.5 mg Nebulization Q4H  . DISCONTD: albuterol  2.5 mg Nebulization Q4H  . DISCONTD: dextrose  25 mL Intravenous Once  . DISCONTD: ipratropium  0.5 mg Nebulization Q4H  . DISCONTD: ipratropium  0.5 mg Nebulization TID   Continuous Infusions:  PRN Meds:.sodium chloride, acetaminophen, acetaminophen, albuterol, calcium carbonate (dosed in mg elemental calcium), camphor-menthol, docusate sodium, feeding supplement (NEPRO CARB STEADY), guaiFENesin-dextromethorphan, hydrOXYzine, nitroGLYCERIN, ondansetron (ZOFRAN) IV, ondansetron, sodium chloride, sorbitol, DISCONTD: albuterol  Assessment/Plan: Patient Active Hospital Problem List: 1. Shortness of breath: a combination of fluid overload, copd exacerbation and bronchitis. On levaquin for the bronchitis. Steroids have been tapered.  2. Elevated cardiac enzymes: probably secondary to demand ischemia. Cardiology consult called and 2d echocardiogram ordered.  3. Leukocytosis: secondary to steroids. Repeat cbc in am.  4. Dm coninue with lantus and SSI 5. ESRD on HD; as per Renal.  DVT pryphylaxis. Full code.    LOS: 3 days   Catricia Scheerer 02/07/2012, 3:52 PM

## 2012-02-07 NOTE — Progress Notes (Signed)
S: off bipap,  Breathing much better O:BP 141/76  Pulse 118  Temp(Src) 97.6 F (36.4 C) (Oral)  Resp 22  Ht 5\' 5"  (1.651 m)  Wt 61 kg (134 lb 7.7 oz)  BMI 22.38 kg/m2  SpO2 90%  Intake/Output Summary (Last 24 hours) at 02/07/12 2774 Last data filed at 02/06/12 0900  Gross per 24 hour  Intake     50 ml  Output   3380 ml  Net  -3330 ml   Weight change: -1.9 kg (-4 lb 3 oz) JOI:NOMVE and alert.  On [place] CVS:RRR Resp:bibasilar crackles Abd:+ BS NTND Ext:no edema.  RUA AVF + bruit NEURO:Ox3 CNI      . acidophilus  1 capsule Oral BID WC  . albuterol  2.5 mg Nebulization TID   And  . ipratropium  0.5 mg Nebulization TID  . albuterol      . albuterol      . aspirin  81 mg Oral Daily  . calcium acetate  667 mg Oral TID WC  . darbepoetin (ARANESP) injection - DIALYSIS  100 mcg Intravenous Q Wed-HD  . dextrose  25 mL Intravenous Once  . ferric gluconate (FERRLECIT/NULECIT) IV  62.5 mg Intravenous Weekly  . insulin aspart  0-9 Units Subcutaneous Q4H  . insulin glargine  10 Units Subcutaneous QHS  . levofloxacin  750 mg Oral Q M,W,F-2000  . levothyroxine  25 mcg Oral QAC breakfast  . LORazepam      . methylPREDNISolone sodium succinate      . multivitamin  1 tablet Oral QHS  . pantoprazole  40 mg Oral Daily  . paricalcitol  1 mcg Intravenous Q M,W,F-HD  . sodium chloride  3 mL Intravenous Q12H  . warfarin  2 mg Oral ONCE-1800  . Warfarin - Pharmacist Dosing Inpatient   Does not apply q1800  . DISCONTD: albuterol  2.5 mg Nebulization Q4H  . DISCONTD: albuterol  2.5 mg Nebulization Q4H  . DISCONTD: ipratropium  0.5 mg Nebulization Q4H  . DISCONTD: ipratropium  0.5 mg Nebulization TID  . DISCONTD: levofloxacin (LEVAQUIN) IV  750 mg Intravenous Q24H  . DISCONTD: levofloxacin (LEVAQUIN) IV  750 mg Intravenous Q M,W,F-2000  . DISCONTD: levothyroxine  25 mcg Oral Daily  . DISCONTD: methylPREDNISolone (SOLU-MEDROL) injection  80 mg Intravenous Q8H  . DISCONTD:  piperacillin-tazobactam (ZOSYN)  IV  2.25 g Intravenous Q8H  . DISCONTD: vancomycin  500 mg Intravenous Q M,W,F-HD   Dg Chest 2 View  02/06/2012  *RADIOLOGY REPORT*  Clinical Data: Short of breath, pulmonary edema, former smoking history, diabetes  CHEST - 2 VIEW  Comparison: Portable chest x-ray of 02/06/2012  Findings: There has been some improvement in the edema pattern. Mild pulmonary vascular congestion remains with small pleural effusions.  Cardiomegaly is stable.  The right central venous line is unchanged with the tips in the lower SVC near the expected right atrial junction.  IMPRESSION: Improvement in edema pattern.  Small effusions and mild cardiomegaly remain.  Original Report Authenticated By: Juline Patch, M.D.   Dg Chest Port 1 View  02/06/2012  *RADIOLOGY REPORT*  Clinical Data: Respiratory distress.  PORTABLE CHEST - 1 VIEW  Comparison: 02/04/2012  Findings: Enlarged heart size with prominent pulmonary vascularity. Interval development of increased perihilar air space disease and infiltration or atelectasis in the lung bases.  Small bilateral pleural effusions.  Changes suggest developing congestive failure or fluid overload.  Right central venous catheter remains unchanged in position.  Previously demonstrated right lung airspace  disease is obscured by the more confluent process today.  No pneumothorax.  IMPRESSION: Increasing congestive changes in the heart and lungs with bilateral pleural effusions and diffuse pulmonary edema.  Original Report Authenticated By: Marlon Pel, M.D.   BMET    Component Value Date/Time   NA 123* 02/06/2012 0407   K 4.8 02/06/2012 0407   CL 84* 02/06/2012 0407   CO2 25 02/06/2012 0407   GLUCOSE 348* 02/06/2012 0407   BUN 35* 02/06/2012 0407   CREATININE 5.04* 02/06/2012 0407   CALCIUM 8.6 02/06/2012 0407   GFRNONAA 8* 02/06/2012 0407   GFRAA 9* 02/06/2012 0407   CBC    Component Value Date/Time   WBC 24.8* 02/06/2012 0407   RBC 3.62* 02/06/2012 0407   HGB  9.8* 02/06/2012 0407   HCT 31.5* 02/06/2012 0407   PLT 403* 02/06/2012 0407   MCV 87.0 02/06/2012 0407   MCH 27.1 02/06/2012 0407   MCHC 31.1 02/06/2012 0407   RDW 17.9* 02/06/2012 0407   LYMPHSABS 0.6* 02/06/2012 0407   MONOABS 0.7 02/06/2012 0407   EOSABS 0.0 02/06/2012 0407   BASOSABS 0.1 02/06/2012 0407     Assessment: 1. PNA 2. pulm edema 3. Sec HPTH 4. Anemia 5. ESRD Plan: 1.Plan HD again today and try to pull more fluid. 2. Recheck labs at HD  Jaclyn Carew T

## 2012-02-07 NOTE — Progress Notes (Signed)
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 Hgb 10.5  Plan: 1) Coumadin 2mg  PO x 1 2) Daily PT/INR

## 2012-02-08 DIAGNOSIS — I059 Rheumatic mitral valve disease, unspecified: Secondary | ICD-10-CM

## 2012-02-08 LAB — PROTIME-INR
INR: 1.85 — ABNORMAL HIGH (ref 0.00–1.49)
Prothrombin Time: 21.7 seconds — ABNORMAL HIGH (ref 11.6–15.2)

## 2012-02-08 LAB — CBC
Hemoglobin: 12 g/dL (ref 12.0–15.0)
MCHC: 32.1 g/dL (ref 30.0–36.0)
Platelets: 404 10*3/uL — ABNORMAL HIGH (ref 150–400)
RDW: 19.9 % — ABNORMAL HIGH (ref 11.5–15.5)

## 2012-02-08 LAB — GLUCOSE, CAPILLARY: Glucose-Capillary: 262 mg/dL — ABNORMAL HIGH (ref 70–99)

## 2012-02-08 MED ORDER — WARFARIN SODIUM 2 MG PO TABS
2.0000 mg | ORAL_TABLET | Freq: Once | ORAL | Status: AC
  Administered 2012-02-08: 2 mg via ORAL
  Filled 2012-02-08: qty 1

## 2012-02-08 NOTE — Progress Notes (Signed)
Subjective: Pleasant cheerful, denies chest pain, sob . Has occasional dry cough.   Objective: Weight change: 1.2 kg (2 lb 10.3 oz)  Intake/Output Summary (Last 24 hours) at 02/08/12 1225 Last data filed at 02/08/12 0950  Gross per 24 hour  Intake    480 ml  Output      0 ml  Net    480 ml    Filed Vitals:   02/08/12 0949  BP: 158/63  Pulse: 110  Temp: 97.5 F (36.4 C)  Resp: 17    On exam  She is alert afebrile comfortable. CVS s1s2 heard, slightly tachycardic Lungs clear, good air entry bilateral Abdomen: soft non tender non distended bowel sounds heard Extremities: no pedal edema.   Lab Results: Results for orders placed during the hospital encounter of 02/04/12 (from the past 24 hour(s))  GLUCOSE, CAPILLARY     Status: Abnormal   Collection Time   02/07/12  1:15 PM      Component Value Range   Glucose-Capillary 160 (*) 70 - 99 (mg/dL)  GLUCOSE, CAPILLARY     Status: Abnormal   Collection Time   02/07/12  5:07 PM      Component Value Range   Glucose-Capillary 183 (*) 70 - 99 (mg/dL)  GLUCOSE, CAPILLARY     Status: Abnormal   Collection Time   02/07/12  9:37 PM      Component Value Range   Glucose-Capillary 217 (*) 70 - 99 (mg/dL)   Comment 1 Documented in Chart     Comment 2 Notify RN    GLUCOSE, CAPILLARY     Status: Abnormal   Collection Time   02/08/12  1:39 AM      Component Value Range   Glucose-Capillary 200 (*) 70 - 99 (mg/dL)   Comment 1 Notify RN    GLUCOSE, CAPILLARY     Status: Abnormal   Collection Time   02/08/12  4:21 AM      Component Value Range   Glucose-Capillary 129 (*) 70 - 99 (mg/dL)   Comment 1 Notify RN    PROTIME-INR     Status: Abnormal   Collection Time   02/08/12  5:39 AM      Component Value Range   Prothrombin Time 21.7 (*) 11.6 - 15.2 (seconds)   INR 1.85 (*) 0.00 - 1.49   GLUCOSE, CAPILLARY     Status: Normal   Collection Time   02/08/12  8:08 AM      Component Value Range   Glucose-Capillary 95  70 - 99 (mg/dL)    Micro  Results: Recent Results (from the past 240 hour(s))  CULTURE, BLOOD (ROUTINE X 2)     Status: Normal (Preliminary result)   Collection Time   02/04/12  3:40 PM      Component Value Range Status Comment   Specimen Description BLOOD FINGER LEFT   Final    Special Requests BOTTLES DRAWN AEROBIC ONLY 4CC   Final    Culture  Setup Time [id number]   Final    Culture     Final    Value:        BLOOD CULTURE RECEIVED NO GROWTH TO DATE CULTURE WILL BE HELD FOR 5 DAYS BEFORE ISSUING A FINAL NEGATIVE REPORT   Report Status PENDING   Incomplete   CULTURE, BLOOD (ROUTINE X 2)     Status: Normal (Preliminary result)   Collection Time   02/04/12  4:00 PM      Component Value Range  Status Comment   Specimen Description BLOOD ARM LEFT   Final    Special Requests BOTTLES DRAWN AEROBIC ONLY 8CC   Final    Culture  Setup Time [id number]   Final    Culture     Final    Value:        BLOOD CULTURE RECEIVED NO GROWTH TO DATE CULTURE WILL BE HELD FOR 5 DAYS BEFORE ISSUING A FINAL NEGATIVE REPORT   Report Status PENDING   Incomplete   MRSA PCR SCREENING     Status: Normal   Collection Time   02/06/12  2:25 AM      Component Value Range Status Comment   MRSA by PCR NEGATIVE  NEGATIVE  Final     Studies/Results: Dg Chest 2 View  02/06/2012  *RADIOLOGY REPORT*  Clinical Data: Short of breath, pulmonary edema, former smoking history, diabetes  CHEST - 2 VIEW  Comparison: Portable chest x-ray of 02/06/2012  Findings: There has been some improvement in the edema pattern. Mild pulmonary vascular congestion remains with small pleural effusions.  Cardiomegaly is stable.  The right central venous line is unchanged with the tips in the lower SVC near the expected right atrial junction.  IMPRESSION: Improvement in edema pattern.  Small effusions and mild cardiomegaly remain.  Original Report Authenticated By: Juline Patch, M.D.   Dg Chest 2 View  02/04/2012  *RADIOLOGY REPORT*  Clinical Data: Recent pneumonia  CHEST - 2  VIEW  Comparison: 01/24/2012  Findings: Cardiomediastinal silhouette is stable.  Dual lumen right IJ catheter is unchanged in position.  There is a small right pleural effusion with right basilar atelectasis or infiltrate.  New patchy airspace disease right upper lobe perihilar suspicious for pneumonia. Left lung is clear.  No pulmonary edema.  IMPRESSION:  There is a small right pleural effusion with right basilar atelectasis or infiltrate.  New patchy airspace disease right upper lobe perihilar suspicious for pneumonia.  Original Report Authenticated By: Natasha Mead, M.D.   Dg Chest Port 1 View  02/07/2012  *RADIOLOGY REPORT*  Clinical Data: 71 year old female with pulmonary edema.  Acute-on- chronic respiratory failure.  Bilateral pleural effusions.  PORTABLE CHEST - 1 VIEW  Comparison: 02/06/2012 and earlier.  Findings: Portable semi upright AP view 0708 hours.  Stable right IJ approach dual lumen dialysis type catheter.  Stable cardiac size and mediastinal contours.  Worsening airspace opacity at both lung bases.  Superimposed veiling opacity compatible with small to moderate effusions.  No pneumothorax.  Vascular congestion is increased.  IMPRESSION: Interval worsening vascular congestion/interstitial edema, small to moderate bilateral effusions, and bibasilar ventilation.  Original Report Authenticated By: Harley Hallmark, M.D.   Dg Chest Port 1 View  02/06/2012  *RADIOLOGY REPORT*  Clinical Data: Respiratory distress.  PORTABLE CHEST - 1 VIEW  Comparison: 02/04/2012  Findings: Enlarged heart size with prominent pulmonary vascularity. Interval development of increased perihilar air space disease and infiltration or atelectasis in the lung bases.  Small bilateral pleural effusions.  Changes suggest developing congestive failure or fluid overload.  Right central venous catheter remains unchanged in position.  Previously demonstrated right lung airspace disease is obscured by the more confluent process today.   No pneumothorax.  IMPRESSION: Increasing congestive changes in the heart and lungs with bilateral pleural effusions and diffuse pulmonary edema.  Original Report Authenticated By: Marlon Pel, M.D.   Portable Chest Xray In Am  01/24/2012  *RADIOLOGY REPORT*  Clinical Data: Pneumonia  PORTABLE CHEST - 1 VIEW  Comparison: 01/23/2012; 10/23/2011; chest CT - 10/22/2011  Findings: Unchanged cardiac silhouette and mediastinal contours. Stable positioning of support apparatus.  Grossly unchanged right mid and lower lung heterogeneous air space opacities.  Grossly unchanged left basilar/retrocardiac heterogeneous opacities.  The pulmonary vasculature remains indistinct.  Persistent blunting of the right costophrenic angle suggesting a small right-sided pleural effusion. No pneumothorax.  Unchanged bones.  IMPRESSION: 1.  Grossly unchanged right mid and lower lung heterogeneous air space opacity worrisome for infection. 2.  Grossly unchanged left basilar/retrocardiac opacity, possibly atelectasis. 3.  Unchanged findings of mild pulmonary edema and small right- sided effusion.  Original Report Authenticated By: Waynard Reeds, M.D.   Portable Chest Xray  01/23/2012  *RADIOLOGY REPORT*  Clinical Data: Increased shortness of breath  PORTABLE CHEST - 1 VIEW  Comparison: Portable chest x-ray of 01/23/2012  Findings: There has been some improvement in airspace disease at the right lung base and in the right mid lung.  A small right effusion remains.  Mild cardiomegaly is stable.  There is still evidence of mild pulmonary vascular congestion.  Right central venous line is unchanged in position.  IMPRESSION: Some improvement in airspace disease.  Persistent cardiomegaly, right effusion, and mild pulmonary vascular congestion.  Original Report Authenticated By: Juline Patch, M.D.   Medications: Scheduled Meds:   . acidophilus  1 capsule Oral BID WC  . albuterol  2.5 mg Nebulization TID   And  . ipratropium  0.5  mg Nebulization TID  . aspirin  81 mg Oral Daily  . calcium acetate  667 mg Oral TID WC  . darbepoetin (ARANESP) injection - DIALYSIS  100 mcg Intravenous Q Wed-HD  . ferric gluconate (FERRLECIT/NULECIT) IV  62.5 mg Intravenous Weekly  . insulin aspart  0-9 Units Subcutaneous Q4H  . insulin glargine  10 Units Subcutaneous QHS  . levofloxacin  750 mg Oral Q M,W,F-2000  . levothyroxine  25 mcg Oral QAC breakfast  . multivitamin  1 tablet Oral QHS  . pantoprazole  40 mg Oral Daily  . paricalcitol  1 mcg Intravenous Q M,W,F-HD  . sodium chloride  3 mL Intravenous Q12H  . warfarin  2 mg Oral ONCE-1800  . warfarin  2 mg Oral ONCE-1800  . Warfarin - Pharmacist Dosing Inpatient   Does not apply q1800   Continuous Infusions:  PRN Meds:.sodium chloride, acetaminophen, acetaminophen, albuterol, calcium carbonate (dosed in mg elemental calcium), camphor-menthol, docusate sodium, feeding supplement (NEPRO CARB STEADY), guaiFENesin-dextromethorphan, hydrOXYzine, nitroGLYCERIN, ondansetron (ZOFRAN) IV, ondansetron, sodium chloride, sorbitol  Assessment/Plan: Patient Active Hospital Problem List: 1. 1. Shortness of breath: a combination of fluid overload, copd exacerbation and bronchitis. Resolved. On levaquin for the bronchitis. Continue with nebs and oxygen as needed.  2. Elevated cardiac enzymes: probably secondary to demand ischemia. Cardiology consult appreciated and 2 D echo done and results pending.  3. Leukocytosis; probably related to steroids. Repeat CBC ordered today.  4. Dm: continue with lantus and SSI. Her recent CBG'S are better controlled with the cessation of steroids.  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 INR  is subtherapeutic.  6. ESRD on HD Monday/wed/friday.  7.Hypertension: controlled.  Dispo: possible d/c in am with HHPT.      LOS: 4 days   Millard Bautch 02/08/2012, 12:25 PM

## 2012-02-08 NOTE — Progress Notes (Signed)
  Echocardiogram 2D Echocardiogram has been performed.  [name] [name] L 02/08/2012, 2:05 PM 

## 2012-02-08 NOTE — Progress Notes (Signed)
SUBJECTIVE:  Breathing better today  OBJECTIVE:   Vitals:   Filed Vitals:   02/07/12 1702 02/07/12 2138 02/08/12 0426 02/08/12 0949  BP: 126/54 117/60 148/59 158/63  Pulse: 109 103 109 110  Temp: 98.4 F (36.9 C) 98.6 F (37 C) 97.2 F (36.2 C) 97.5 F (36.4 C)  TempSrc: Oral Oral Oral Oral  Resp: 19 20 18 17   Height:      Weight:      SpO2: 90% 96% 93% 98%   I&O's:   Intake/Output Summary (Last 24 hours) at 02/08/12 1132 Last data filed at 02/08/12 0950  Gross per 24 hour  Intake    480 ml  Output   2880 ml  Net  -2400 ml   TELEMETRY: Reviewed telemetry pt in NSR to sinus tachycardia     PHYSICAL EXAM General: Well developed, well nourished, in no acute distress Head: Eyes PERRLA, No xanthomas.   Normal cephalic and atramatic  Lungs:   Clear bilaterally to auscultation and percussion. Heart:   HRRR S1 S2 Pulses are 2+ & equal. Abdomen: Bowel sounds are positive, abdomen soft and non-tender without masses  Extremities:   No clubbing, cyanosis or edema.  DP +1 Neuro: Alert and oriented X 3. Psych:  Good affect, responds appropriately   LABS: Basic Metabolic Panel:  Basename 02/07/12 0930 02/06/12 0407  NA 131* 123*  K 5.0 4.8  CL 93* 84*  CO2 22 25  GLUCOSE 141* 348*  BUN 42* 35*  CREATININE 4.01* 5.04*  CALCIUM 9.5 8.6  MG -- --  PHOS 3.2 --   Liver Function Tests:  Basename 02/07/12 0930 02/06/12 0407  AST -- 21  ALT -- 16  ALKPHOS -- 135*  BILITOT -- 0.2*  PROT -- 7.0  ALBUMIN 2.7* 2.6*   No results found for this basename: LIPASE:2,AMYLASE:2 in the last 72 hours CBC:  Basename 02/07/12 0930 02/06/12 0407  WBC 26.3* 24.8*  NEUTROABS -- 23.4*  HGB 10.5* 9.8*  HCT 33.4* 31.5*  MCV 88.1 87.0  PLT 484* 403*   Cardiac Enzymes:  Basename 02/07/12 0930 02/06/12 2220 02/06/12 1524  CKTOTAL 42 26 25  CKMB 4.5* 2.7 1.9  CKMBINDEX -- -- --  TROPONINI 3.81* 1.39* 0.34*   Coag Panel:   Lab Results  Component Value Date   INR 1.85*  02/08/2012   INR 1.85* 02/07/2012   INR 2.02* 02/06/2012    RADIOLOGY: Dg Chest 2 View  02/06/2012  *RADIOLOGY REPORT*  Clinical Data: Short of breath, pulmonary edema, former smoking history, diabetes  CHEST - 2 VIEW  Comparison: Portable chest x-ray of 02/06/2012  Findings: There has been some improvement in the edema pattern. Mild pulmonary vascular congestion remains with small pleural effusions.  Cardiomegaly is stable.  The right central venous line is unchanged with the tips in the lower SVC near the expected right atrial junction.  IMPRESSION: Improvement in edema pattern.  Small effusions and mild cardiomegaly remain.  Original Report Authenticated By: Juline Patch, M.D.   Dg Chest 2 View  02/04/2012  *RADIOLOGY REPORT*  Clinical Data: Recent pneumonia  CHEST - 2 VIEW  Comparison: 01/24/2012  Findings: Cardiomediastinal silhouette is stable.  Dual lumen right IJ catheter is unchanged in position.  There is a small right pleural effusion with right basilar atelectasis or infiltrate.  New patchy airspace disease right upper lobe perihilar suspicious for pneumonia. Left lung is clear.  No pulmonary edema.  IMPRESSION:  There is a small right pleural effusion with  right basilar atelectasis or infiltrate.  New patchy airspace disease right upper lobe perihilar suspicious for pneumonia.  Original Report Authenticated By: Natasha Mead, M.D.   Dg Chest Port 1 View  02/07/2012  *RADIOLOGY REPORT*  Clinical Data: 71 year old female with pulmonary edema.  Acute-on- chronic respiratory failure.  Bilateral pleural effusions.  PORTABLE CHEST - 1 VIEW  Comparison: 02/06/2012 and earlier.  Findings: Portable semi upright AP view 0708 hours.  Stable right IJ approach dual lumen dialysis type catheter.  Stable cardiac size and mediastinal contours.  Worsening airspace opacity at both lung bases.  Superimposed veiling opacity compatible with small to moderate effusions.  No pneumothorax.  Vascular congestion is increased.   IMPRESSION: Interval worsening vascular congestion/interstitial edema, small to moderate bilateral effusions, and bibasilar ventilation.  Original Report Authenticated By: Harley Hallmark, M.D.   Dg Chest Port 1 View  02/06/2012  *RADIOLOGY REPORT*  Clinical Data: Respiratory distress.  PORTABLE CHEST - 1 VIEW  Comparison: 02/04/2012  Findings: Enlarged heart size with prominent pulmonary vascularity. Interval development of increased perihilar air space disease and infiltration or atelectasis in the lung bases.  Small bilateral pleural effusions.  Changes suggest developing congestive failure or fluid overload.  Right central venous catheter remains unchanged in position.  Previously demonstrated right lung airspace disease is obscured by the more confluent process today.  No pneumothorax.  IMPRESSION: Increasing congestive changes in the heart and lungs with bilateral pleural effusions and diffuse pulmonary edema.  Original Report Authenticated By: Marlon Pel, M.D.   Portable Chest Xray In Am  01/24/2012  *RADIOLOGY REPORT*  Clinical Data: Pneumonia  PORTABLE CHEST - 1 VIEW  Comparison: 01/23/2012; 10/23/2011; chest CT - 10/22/2011  Findings: Unchanged cardiac silhouette and mediastinal contours. Stable positioning of support apparatus.  Grossly unchanged right mid and lower lung heterogeneous air space opacities.  Grossly unchanged left basilar/retrocardiac heterogeneous opacities.  The pulmonary vasculature remains indistinct.  Persistent blunting of the right costophrenic angle suggesting a small right-sided pleural effusion. No pneumothorax.  Unchanged bones.  IMPRESSION: 1.  Grossly unchanged right mid and lower lung heterogeneous air space opacity worrisome for infection. 2.  Grossly unchanged left basilar/retrocardiac opacity, possibly atelectasis. 3.  Unchanged findings of mild pulmonary edema and small right- sided effusion.  Original Report Authenticated By: Waynard Reeds, M.D.   Portable  Chest Xray  01/23/2012  *RADIOLOGY REPORT*  Clinical Data: Increased shortness of breath  PORTABLE CHEST - 1 VIEW  Comparison: Portable chest x-ray of 01/23/2012  Findings: There has been some improvement in airspace disease at the right lung base and in the right mid lung.  A small right effusion remains.  Mild cardiomegaly is stable.  There is still evidence of mild pulmonary vascular congestion.  Right central venous line is unchanged in position.  IMPRESSION: Some improvement in airspace disease.  Persistent cardiomegaly, right effusion, and mild pulmonary vascular congestion.  Original Report Authenticated By: Juline Patch, M.D.     ASSESSMENT:  1. Abnormal cardiac enzymes primarily elevated troponin c/w NSTEMI most likely type II from demand ischemia from hypoxia in the setting of renal failure - normal LVF by echo 10/2011  2. Acute on chronic respiratory failure secondary to hospital acquired pneumonia with a component of COPD exacerbation and pulmonary edema from volume overload due to CKD.  3. DM  4. HTN  5. ESRD on HD  6. Systemic anticoagulation with subtherapeutic INR - pharmacy managing 7. CAD with history of MI  8. Volume overload  due to CKD s/p emergent HD - EF on echo 10/2011 normal   PLAN:   1.  Continue Current medical therapy 2.  No beta blocker due to COPD exacerbation 3.  Volume management per renal  4.  Echo pending  Quintella Reichert, MD  02/08/2012  11:32 AM

## 2012-02-08 NOTE — Progress Notes (Signed)
S: up walking in the room.  No sob.  On chronic )2 at home O:BP 148/59  Pulse 109  Temp(Src) 97.2 F (36.2 C) (Oral)  Resp 18  Ht 5\' 5"  (1.651 m)  Wt 59.7 kg (131 lb 9.8 oz)  BMI 21.90 kg/m2  SpO2 93%  Intake/Output Summary (Last 24 hours) at 02/08/12 0833 Last data filed at 02/07/12 2045  Gross per 24 hour  Intake    240 ml  Output   2880 ml  Net  -2640 ml   Weight change: 1.2 kg (2 lb 10.3 oz) ZOX:WRUEA and alert.  On [place] CVS:RRR Resp: clear Abd:+ BS NTND Ext:no edema.  RUA AVF + bruit NEURO:Ox3 CNI Rt perm cath      . acidophilus  1 capsule Oral BID WC  . albuterol  2.5 mg Nebulization TID   And  . ipratropium  0.5 mg Nebulization TID  . aspirin  81 mg Oral Daily  . calcium acetate  667 mg Oral TID WC  . darbepoetin (ARANESP) injection - DIALYSIS  100 mcg Intravenous Q Wed-HD  . ferric gluconate (FERRLECIT/NULECIT) IV  62.5 mg Intravenous Weekly  . insulin aspart  0-9 Units Subcutaneous Q4H  . insulin glargine  10 Units Subcutaneous QHS  . levofloxacin  750 mg Oral Q M,W,F-2000  . levothyroxine  25 mcg Oral QAC breakfast  . multivitamin  1 tablet Oral QHS  . pantoprazole  40 mg Oral Daily  . paricalcitol  1 mcg Intravenous Q M,W,F-HD  . sodium chloride  3 mL Intravenous Q12H  . warfarin  2 mg Oral ONCE-1800  . Warfarin - Pharmacist Dosing Inpatient   Does not apply q1800   Dg Chest 2 View  02/06/2012  *RADIOLOGY REPORT*  Clinical Data: Short of breath, pulmonary edema, former smoking history, diabetes  CHEST - 2 VIEW  Comparison: Portable chest x-ray of 02/06/2012  Findings: There has been some improvement in the edema pattern. Mild pulmonary vascular congestion remains with small pleural effusions.  Cardiomegaly is stable.  The right central venous line is unchanged with the tips in the lower SVC near the expected right atrial junction.  IMPRESSION: Improvement in edema pattern.  Small effusions and mild cardiomegaly remain.  Original Report Authenticated By: Juline Patch, M.D.   Dg Chest Port 1 View  02/07/2012  *RADIOLOGY REPORT*  Clinical Data: 71 year old female with pulmonary edema.  Acute-on- chronic respiratory failure.  Bilateral pleural effusions.  PORTABLE CHEST - 1 VIEW  Comparison: 02/06/2012 and earlier.  Findings: Portable semi upright AP view 0708 hours.  Stable right IJ approach dual lumen dialysis type catheter.  Stable cardiac size and mediastinal contours.  Worsening airspace opacity at both lung bases.  Superimposed veiling opacity compatible with small to moderate effusions.  No pneumothorax.  Vascular congestion is increased.  IMPRESSION: Interval worsening vascular congestion/interstitial edema, small to moderate bilateral effusions, and bibasilar ventilation.  Original Report Authenticated By: Harley Hallmark, M.D.   BMET    Component Value Date/Time   NA 131* 02/07/2012 0930   K 5.0 02/07/2012 0930   CL 93* 02/07/2012 0930   CO2 22 02/07/2012 0930   GLUCOSE 141* 02/07/2012 0930   BUN 42* 02/07/2012 0930   CREATININE 4.01* 02/07/2012 0930   CALCIUM 9.5 02/07/2012 0930   GFRNONAA 11* 02/07/2012 0930   GFRAA 12* 02/07/2012 0930   CBC    Component Value Date/Time   WBC 26.3* 02/07/2012 0930   RBC 3.79* 02/07/2012 0930   HGB  10.5* 02/07/2012 0930   HCT 33.4* 02/07/2012 0930   PLT 484* 02/07/2012 0930   MCV 88.1 02/07/2012 0930   MCH 27.7 02/07/2012 0930   MCHC 31.4 02/07/2012 0930   RDW 19.1* 02/07/2012 0930   LYMPHSABS 0.6* 02/06/2012 0407   MONOABS 0.7 02/06/2012 0407   EOSABS 0.0 02/06/2012 0407   BASOSABS 0.1 02/06/2012 0407     Assessment: 1. PNA 2. pulm edema 3. Sec HPTH 4. Anemia 5. ESRD Plan: 1.HD in am and recheck CXR 2. ? Home after HD tomorrow  Tyresha Fede T

## 2012-02-08 NOTE — Progress Notes (Signed)
ANTICOAGULATION CONSULT NOTE - Follow Up Consult  Pharmacy Consult for Coumadin Indication: Hx DVT/PE  Assessment: [name] [name] on coumadin PTA for hx DVT/PE.  INR subtherapeutic @ 1.85.  Hgb low, stable.  No bleeding noted.   Goal of Therapy:  INR 2.0-3.0   Plan:  1.  Coumadin 2mg  po x1 2.  F/u daily PT/INR  Strider Vallance E 02/08/2012,11:26 AM    Allergies  Allergen Reactions  . Novocain Other (See Comments)    Unknown    Patient Measurements: Height: 5\' 5"  (165.1 cm) Weight: 131 lb 9.8 oz (59.7 kg) IBW/kg (Calculated) : 57  Heparin Dosing Weight:   Vital Signs: Temp: 97.5 F (36.4 C) (04/07 0949) Temp src: Oral (04/07 0949) BP: 158/63 mmHg (04/07 0949) Pulse Rate: 110  (04/07 0949)  Labs:  Basename 02/08/12 0539 02/07/12 0930 02/06/12 2220 02/06/12 1524 02/06/12 0407 02/05/12 1945  HGB -- 10.5* -- -- 9.8* --  HCT -- 33.4* -- -- 31.5* 32.1*  PLT -- 484* -- -- 403* --  APTT -- -- -- -- -- --  LABPROT 21.7* 21.7* -- -- 23.2* --  INR 1.85* 1.85* -- -- 2.02* --  HEPARINUNFRC -- -- -- -- -- --  CREATININE -- 4.01* -- -- 5.04* --  CKTOTAL -- 42 26 25 -- --  CKMB -- 4.5* 2.7 1.9 -- --  TROPONINI -- 3.81* 1.39* 0.34* -- --   Estimated Creatinine Clearance: 12.1 ml/min (by C-G formula based on Cr of 4.01).

## 2012-02-09 ENCOUNTER — Inpatient Hospital Stay (HOSPITAL_COMMUNITY): Payer: Medicare Other

## 2012-02-09 DIAGNOSIS — D649 Anemia, unspecified: Secondary | ICD-10-CM

## 2012-02-09 DIAGNOSIS — J189 Pneumonia, unspecified organism: Principal | ICD-10-CM

## 2012-02-09 DIAGNOSIS — R748 Abnormal levels of other serum enzymes: Secondary | ICD-10-CM

## 2012-02-09 DIAGNOSIS — J810 Acute pulmonary edema: Secondary | ICD-10-CM

## 2012-02-09 LAB — GLUCOSE, CAPILLARY
Glucose-Capillary: 205 mg/dL — ABNORMAL HIGH (ref 70–99)
Glucose-Capillary: 84 mg/dL (ref 70–99)
Glucose-Capillary: 86 mg/dL (ref 70–99)
Glucose-Capillary: 296 mg/dL — ABNORMAL HIGH (ref 70–99)
Glucose-Capillary: 207 mg/dL — ABNORMAL HIGH (ref 70–99)

## 2012-02-09 LAB — CBC
MCHC: 31.6 g/dL (ref 30.0–36.0)
MCV: 87.6 fL (ref 78.0–100.0)
Platelets: 407 10*3/uL — ABNORMAL HIGH (ref 150–400)
RDW: 19.7 % — ABNORMAL HIGH (ref 11.5–15.5)
WBC: 14.8 10*3/uL — ABNORMAL HIGH (ref 4.0–10.5)

## 2012-02-09 LAB — BASIC METABOLIC PANEL WITH GFR
BUN: 57 mg/dL — ABNORMAL HIGH (ref 6–23)
CO2: 22 meq/L (ref 19–32)
Calcium: 9.4 mg/dL (ref 8.4–10.5)
Chloride: 88 meq/L — ABNORMAL LOW (ref 96–112)
Creatinine, Ser: 4.91 mg/dL — ABNORMAL HIGH (ref 0.50–1.10)
GFR calc Af Amer: 10 mL/min — ABNORMAL LOW
GFR calc non Af Amer: 8 mL/min — ABNORMAL LOW
Glucose, Bld: 96 mg/dL (ref 70–99)
Potassium: 4.7 meq/L (ref 3.5–5.1)
Sodium: 126 meq/L — ABNORMAL LOW (ref 135–145)

## 2012-02-09 LAB — PROTIME-INR
INR: 2.63 — ABNORMAL HIGH (ref 0.00–1.49)
Prothrombin Time: 28.5 seconds — ABNORMAL HIGH (ref 11.6–15.2)

## 2012-02-09 LAB — BASIC METABOLIC PANEL
CO2: 28 mEq/L (ref 19–32)
Calcium: 9.2 mg/dL (ref 8.4–10.5)
Chloride: 92 mEq/L — ABNORMAL LOW (ref 96–112)
Glucose, Bld: 286 mg/dL — ABNORMAL HIGH (ref 70–99)
Sodium: 133 mEq/L — ABNORMAL LOW (ref 135–145)

## 2012-02-09 MED ORDER — PARICALCITOL 5 MCG/ML IV SOLN
INTRAVENOUS | Status: AC
  Administered 2012-02-09: 1 ug via INTRAVENOUS
  Filled 2012-02-09: qty 1

## 2012-02-09 NOTE — Progress Notes (Signed)
Subjective: Nauseas. One episode of vomiting.   Objective: Weight change: -0.6 kg (-1 lb 5.2 oz)  Intake/Output Summary (Last 24 hours) at 02/09/12 1625 Last data filed at 02/09/12 1047  Gross per 24 hour  Intake    120 ml  Output   2400 ml  Net  -2280 ml    Filed Vitals:   02/09/12 1122  BP: 154/60  Pulse: 111  Temp: 97.9 F (36.6 C)  Resp: 20   On exam  SHe is alert afebrile in mild distress CVS s1s2 heard tachycardic Lungs clear Abdomen soft non tender non distended bowel sounds heard Extremities: no pedal edema.    Lab Results: Results for orders placed during the hospital encounter of 02/04/12 (from the past 24 hour(s))  GLUCOSE, CAPILLARY     Status: Abnormal   Collection Time   02/08/12 10:38 PM      Component Value Range   Glucose-Capillary 246 (*) 70 - 99 (mg/dL)   Comment 1 Documented in Chart     Comment 2 Notify RN    GLUCOSE, CAPILLARY     Status: Normal   Collection Time   02/09/12  1:16 AM      Component Value Range   Glucose-Capillary 84  70 - 99 (mg/dL)   Comment 1 Documented in Chart     Comment 2 Notify RN    GLUCOSE, CAPILLARY     Status: Normal   Collection Time   02/09/12  4:00 AM      Component Value Range   Glucose-Capillary 86  70 - 99 (mg/dL)   Comment 1 Documented in Chart     Comment 2 Notify RN    BASIC METABOLIC PANEL     Status: Abnormal   Collection Time   02/09/12  7:03 AM      Component Value Range   Sodium 126 (*) 135 - 145 (mEq/L)   Potassium 4.7  3.5 - 5.1 (mEq/L)   Chloride 88 (*) 96 - 112 (mEq/L)   CO2 22  19 - 32 (mEq/L)   Glucose, Bld 96  70 - 99 (mg/dL)   BUN 57 (*) 6 - 23 (mg/dL)   Creatinine, Ser 1.19 (*) 0.50 - 1.10 (mg/dL)   Calcium 9.4  8.4 - 14.7 (mg/dL)   GFR calc non Af Amer 8 (*) >90 (mL/min)   GFR calc Af Amer 10 (*) >90 (mL/min)  CBC     Status: Abnormal   Collection Time   02/09/12  7:03 AM      Component Value Range   WBC 14.8 (*) 4.0 - 10.5 (K/uL)   RBC 4.12  3.87 - 5.11 (MIL/uL)   Hemoglobin 11.4  (*) 12.0 - 15.0 (g/dL)   HCT 82.9  56.2 - 13.0 (%)   MCV 87.6  78.0 - 100.0 (fL)   MCH 27.7  26.0 - 34.0 (pg)   MCHC 31.6  30.0 - 36.0 (g/dL)   RDW 86.5 (*) 78.4 - 15.5 (%)   Platelets 407 (*) 150 - 400 (K/uL)  PROTIME-INR     Status: Abnormal   Collection Time   02/09/12  8:00 AM      Component Value Range   Prothrombin Time 28.5 (*) 11.6 - 15.2 (seconds)   INR 2.63 (*) 0.00 - 1.49   GLUCOSE, CAPILLARY     Status: Normal   Collection Time   02/09/12 11:23 AM      Component Value Range   Glucose-Capillary 95  70 - 99 (mg/dL)  Comment 1 Documented in Chart     Comment 2 Notify RN    BASIC METABOLIC PANEL     Status: Abnormal   Collection Time   02/09/12  4:11 PM      Component Value Range   Sodium 133 (*) 135 - 145 (mEq/L)   Potassium 4.2  3.5 - 5.1 (mEq/L)   Chloride 92 (*) 96 - 112 (mEq/L)   CO2 28  19 - 32 (mEq/L)   Glucose, Bld 286 (*) 70 - 99 (mg/dL)   BUN 14  6 - 23 (mg/dL)   Creatinine, Ser 1.61 (*) 0.50 - 1.10 (mg/dL)   Calcium 9.2  8.4 - 09.6 (mg/dL)   GFR calc non Af Amer 27 (*) >90 (mL/min)   GFR calc Af Amer 32 (*) >90 (mL/min)  GLUCOSE, CAPILLARY     Status: Abnormal   Collection Time   02/09/12  4:52 PM      Component Value Range   Glucose-Capillary 296 (*) 70 - 99 (mg/dL)   Comment 1 Documented in Chart     Comment 2 Notify RN    GLUCOSE, CAPILLARY     Status: Abnormal   Collection Time   02/09/12  9:18 PM      Component Value Range   Glucose-Capillary 207 (*) 70 - 99 (mg/dL)   Comment 1 Notify RN     Comment 2 Documented in Chart       Micro Results: Recent Results (from the past 240 hour(s))  CULTURE, BLOOD (ROUTINE X 2)     Status: Normal (Preliminary result)   Collection Time   02/04/12  3:40 PM      Component Value Range Status Comment   Specimen Description BLOOD FINGER LEFT   Final    Special Requests BOTTLES DRAWN AEROBIC ONLY 4CC   Final    Culture  Setup Time [id number]   Final    Culture     Final    Value:        BLOOD CULTURE RECEIVED NO  GROWTH TO DATE CULTURE WILL BE HELD FOR 5 DAYS BEFORE ISSUING A FINAL NEGATIVE REPORT   Report Status PENDING   Incomplete   CULTURE, BLOOD (ROUTINE X 2)     Status: Normal (Preliminary result)   Collection Time   02/04/12  4:00 PM      Component Value Range Status Comment   Specimen Description BLOOD ARM LEFT   Final    Special Requests BOTTLES DRAWN AEROBIC ONLY 8CC   Final    Culture  Setup Time [id number]   Final    Culture     Final    Value:        BLOOD CULTURE RECEIVED NO GROWTH TO DATE CULTURE WILL BE HELD FOR 5 DAYS BEFORE ISSUING A FINAL NEGATIVE REPORT   Report Status PENDING   Incomplete   MRSA PCR SCREENING     Status: Normal   Collection Time   02/06/12  2:25 AM      Component Value Range Status Comment   MRSA by PCR NEGATIVE  NEGATIVE  Final     Studies/Results: Dg Chest 2 View  02/06/2012  *RADIOLOGY REPORT*  Clinical Data: Short of breath, pulmonary edema, former smoking history, diabetes  CHEST - 2 VIEW  Comparison: Portable chest x-ray of 02/06/2012  Findings: There has been some improvement in the edema pattern. Mild pulmonary vascular congestion remains with small pleural effusions.  Cardiomegaly is stable.  The right central venous line  is unchanged with the tips in the lower SVC near the expected right atrial junction.  IMPRESSION: Improvement in edema pattern.  Small effusions and mild cardiomegaly remain.  Original Report Authenticated By: Juline Patch, M.D.   Dg Chest 2 View  02/04/2012  *RADIOLOGY REPORT*  Clinical Data: Recent pneumonia  CHEST - 2 VIEW  Comparison: 01/24/2012  Findings: Cardiomediastinal silhouette is stable.  Dual lumen right IJ catheter is unchanged in position.  There is a small right pleural effusion with right basilar atelectasis or infiltrate.  New patchy airspace disease right upper lobe perihilar suspicious for pneumonia. Left lung is clear.  No pulmonary edema.  IMPRESSION:  There is a small right pleural effusion with right basilar  atelectasis or infiltrate.  New patchy airspace disease right upper lobe perihilar suspicious for pneumonia.  Original Report Authenticated By: Natasha Mead, M.D.   Dg Chest Port 1 View  02/07/2012  *RADIOLOGY REPORT*  Clinical Data: 71 year old female with pulmonary edema.  Acute-on- chronic respiratory failure.  Bilateral pleural effusions.  PORTABLE CHEST - 1 VIEW  Comparison: 02/06/2012 and earlier.  Findings: Portable semi upright AP view 0708 hours.  Stable right IJ approach dual lumen dialysis type catheter.  Stable cardiac size and mediastinal contours.  Worsening airspace opacity at both lung bases.  Superimposed veiling opacity compatible with small to moderate effusions.  No pneumothorax.  Vascular congestion is increased.  IMPRESSION: Interval worsening vascular congestion/interstitial edema, small to moderate bilateral effusions, and bibasilar ventilation.  Original Report Authenticated By: Harley Hallmark, M.D.   Dg Chest Port 1 View  02/06/2012  *RADIOLOGY REPORT*  Clinical Data: Respiratory distress.  PORTABLE CHEST - 1 VIEW  Comparison: 02/04/2012  Findings: Enlarged heart size with prominent pulmonary vascularity. Interval development of increased perihilar air space disease and infiltration or atelectasis in the lung bases.  Small bilateral pleural effusions.  Changes suggest developing congestive failure or fluid overload.  Right central venous catheter remains unchanged in position.  Previously demonstrated right lung airspace disease is obscured by the more confluent process today.  No pneumothorax.  IMPRESSION: Increasing congestive changes in the heart and lungs with bilateral pleural effusions and diffuse pulmonary edema.  Original Report Authenticated By: Marlon Pel, M.D.   Portable Chest Xray In Am  01/24/2012  *RADIOLOGY REPORT*  Clinical Data: Pneumonia  PORTABLE CHEST - 1 VIEW  Comparison: 01/23/2012; 10/23/2011; chest CT - 10/22/2011  Findings: Unchanged cardiac silhouette  and mediastinal contours. Stable positioning of support apparatus.  Grossly unchanged right mid and lower lung heterogeneous air space opacities.  Grossly unchanged left basilar/retrocardiac heterogeneous opacities.  The pulmonary vasculature remains indistinct.  Persistent blunting of the right costophrenic angle suggesting a small right-sided pleural effusion. No pneumothorax.  Unchanged bones.  IMPRESSION: 1.  Grossly unchanged right mid and lower lung heterogeneous air space opacity worrisome for infection. 2.  Grossly unchanged left basilar/retrocardiac opacity, possibly atelectasis. 3.  Unchanged findings of mild pulmonary edema and small right- sided effusion.  Original Report Authenticated By: Waynard Reeds, M.D.   Portable Chest Xray  01/23/2012  *RADIOLOGY REPORT*  Clinical Data: Increased shortness of breath  PORTABLE CHEST - 1 VIEW  Comparison: Portable chest x-ray of 01/23/2012  Findings: There has been some improvement in airspace disease at the right lung base and in the right mid lung.  A small right effusion remains.  Mild cardiomegaly is stable.  There is still evidence of mild pulmonary vascular congestion.  Right central venous line is unchanged  in position.  IMPRESSION: Some improvement in airspace disease.  Persistent cardiomegaly, right effusion, and mild pulmonary vascular congestion.  Original Report Authenticated By: Juline Patch, M.D.   Medications: Scheduled Meds:   . acidophilus  1 capsule Oral BID WC  . albuterol  2.5 mg Nebulization TID   And  . ipratropium  0.5 mg Nebulization TID  . aspirin  81 mg Oral Daily  . calcium acetate  667 mg Oral TID WC  . darbepoetin (ARANESP) injection - DIALYSIS  100 mcg Intravenous Q Wed-HD  . ferric gluconate (FERRLECIT/NULECIT) IV  62.5 mg Intravenous Weekly  . insulin aspart  0-9 Units Subcutaneous Q4H  . insulin glargine  10 Units Subcutaneous QHS  . levofloxacin  750 mg Oral Q M,W,F-2000  . levothyroxine  25 mcg Oral QAC  breakfast  . multivitamin  1 tablet Oral QHS  . pantoprazole  40 mg Oral Daily  . paricalcitol  1 mcg Intravenous Q M,W,F-HD  . sodium chloride  3 mL Intravenous Q12H  . warfarin  2 mg Oral ONCE-1800  . Warfarin - Pharmacist Dosing Inpatient   Does not apply q1800   Continuous Infusions:  PRN Meds:.sodium chloride, acetaminophen, acetaminophen, albuterol, calcium carbonate (dosed in mg elemental calcium), camphor-menthol, docusate sodium, feeding supplement (NEPRO CARB STEADY), guaiFENesin-dextromethorphan, hydrOXYzine, nitroGLYCERIN, ondansetron (ZOFRAN) IV, ondansetron, sodium chloride, sorbitol  Assessment/Plan: Patient Active Hospital Problem List: 1. Shortness of breath: a combination of fluid overload, copd exacerbation and bronchitis. Resolved. On levaquin for the bronchitis. Continue with nebs and oxygen as needed.  2. Elevated cardiac enzymes: probably secondary to demand ischemia. Cardiology consult appreciated and 2 D echo done 3. Leukocytosis; probably related to steroids. Improving leukocytosis.  4. Dm: continue with lantus and SSI. Her recent CBG'S are better controlled with the cessation of steroids.  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 INR is subtherapeutic.  6. ESRD on HD Monday/wed/friday.  7.Hypertension: controlled.  8. Nausea and one episode of vomiting: probably secondary to HD. Will monitor her overnight and discharge in am.  Dispo: possible d/c in am with HHPT.    LOS: 5 days   Jolly Bleicher 02/09/2012, 4:25 PM

## 2012-02-09 NOTE — Progress Notes (Signed)
ANTICOAGULATION CONSULT NOTE - Follow Up Consult  Pharmacy Consult for Coumadin Indication: Hx DVT/PE  Assessment: 26 yoF on coumadin PTA for hx DVT/PE.  Protime increased by ~6 sec.  Hgb low, stable.  No bleeding noted.   Goal of Therapy:  INR 2.0-3.0   Plan:  1.  No coumadin today. 2.  F/u daily PT/INR Talbert Cage, PharmD    Allergies  Allergen Reactions  . Novocain Other (See Comments)    Unknown    Patient Measurements: Height: 5\' 5"  (165.1 cm) Weight: 137 lb 2 oz (62.2 kg) IBW/kg (Calculated) : 57  Heparin Dosing Weight:   Vital Signs: Temp: 96.7 F (35.9 C) (04/08 0630) Temp src: Oral (04/08 0630) BP: 104/55 mmHg (04/08 0950) Pulse Rate: 108  (04/08 0950)  Labs:  Basename 02/09/12 0800 02/09/12 0703 02/08/12 1314 02/08/12 0539 02/07/12 0930 02/06/12 2220 02/06/12 1524  HGB -- 11.4* 12.0 -- -- -- --  HCT -- 36.1 37.4 -- 33.4* -- --  PLT -- 407* 404* -- 484* -- --  APTT -- -- -- -- -- -- --  LABPROT 28.5* -- -- 21.7* 21.7* -- --  INR 2.63* -- -- 1.85* 1.85* -- --  HEPARINUNFRC -- -- -- -- -- -- --  CREATININE -- 4.91* -- -- 4.01* -- --  CKTOTAL -- -- -- -- 42 26 25  CKMB -- -- -- -- 4.5* 2.7 1.9  TROPONINI -- -- -- -- 3.81* 1.39* 0.34*   Estimated Creatinine Clearance: 9.9 ml/min (by C-G formula based on Cr of 4.91).

## 2012-02-09 NOTE — Progress Notes (Signed)
Subjective: Patient denies CP  No SOB. Objective: Filed Vitals:   02/09/12 0900 02/09/12 0930 02/09/12 0945 02/09/12 0950  BP: 117/56 112/56 83/45 104/55  Pulse: 107 104 101 108  Temp:      TempSrc:      Resp:      Height:      Weight:      SpO2:       Weight change: -1 lb 5.2 oz (-0.6 kg)  Intake/Output Summary (Last 24 hours) at 02/09/12 0957 Last data filed at 02/08/12 2245  Gross per 24 hour  Intake    360 ml  Output      0 ml  Net    360 ml    General: Alert, awake, oriented x3, in no acute distress Neck:  JVP is normal Heart: Regular rate and rhythm, without murmurs, rubs, gallops.  Lungs: Clear to auscultation.  No rales or wheezes. Exemities:  No edema.   Neuro: Grossly intact, nonfocal.   Lab Results: Results for orders placed during the hospital encounter of 02/04/12 (from the past 24 hour(s))  GLUCOSE, CAPILLARY     Status: Abnormal   Collection Time   02/08/12 12:25 PM      Component Value Range   Glucose-Capillary 262 (*) 70 - 99 (mg/dL)  CBC     Status: Abnormal   Collection Time   02/08/12  1:14 PM      Component Value Range   WBC 17.6 (*) 4.0 - 10.5 (K/uL)   RBC 4.25  3.87 - 5.11 (MIL/uL)   Hemoglobin 12.0  12.0 - 15.0 (g/dL)   HCT 16.1  09.6 - 04.5 (%)   MCV 88.0  78.0 - 100.0 (fL)   MCH 28.2  26.0 - 34.0 (pg)   MCHC 32.1  30.0 - 36.0 (g/dL)   RDW 40.9 (*) 81.1 - 15.5 (%)   Platelets 404 (*) 150 - 400 (K/uL)  GLUCOSE, CAPILLARY     Status: Abnormal   Collection Time   02/08/12  4:45 PM      Component Value Range   Glucose-Capillary 151 (*) 70 - 99 (mg/dL)  GLUCOSE, CAPILLARY     Status: Abnormal   Collection Time   02/08/12 10:38 PM      Component Value Range   Glucose-Capillary 246 (*) 70 - 99 (mg/dL)   Comment 1 Documented in Chart     Comment 2 Notify RN    GLUCOSE, CAPILLARY     Status: Normal   Collection Time   02/09/12  1:16 AM      Component Value Range   Glucose-Capillary 84  70 - 99 (mg/dL)   Comment 1 Documented in Chart     Comment 2 Notify RN    GLUCOSE, CAPILLARY     Status: Normal   Collection Time   02/09/12  4:00 AM      Component Value Range   Glucose-Capillary 86  70 - 99 (mg/dL)   Comment 1 Documented in Chart     Comment 2 Notify RN    BASIC METABOLIC PANEL     Status: Abnormal   Collection Time   02/09/12  7:03 AM      Component Value Range   Sodium 126 (*) 135 - 145 (mEq/L)   Potassium 4.7  3.5 - 5.1 (mEq/L)   Chloride 88 (*) 96 - 112 (mEq/L)   CO2 22  19 - 32 (mEq/L)   Glucose, Bld 96  70 - 99 (mg/dL)   BUN 57 (*)  6 - 23 (mg/dL)   Creatinine, Ser 6.04 (*) 0.50 - 1.10 (mg/dL)   Calcium 9.4  8.4 - 54.0 (mg/dL)   GFR calc non Af Amer 8 (*) >90 (mL/min)   GFR calc Af Amer 10 (*) >90 (mL/min)  CBC     Status: Abnormal   Collection Time   02/09/12  7:03 AM      Component Value Range   WBC 14.8 (*) 4.0 - 10.5 (K/uL)   RBC 4.12  3.87 - 5.11 (MIL/uL)   Hemoglobin 11.4 (*) 12.0 - 15.0 (g/dL)   HCT 98.1  19.1 - 47.8 (%)   MCV 87.6  78.0 - 100.0 (fL)   MCH 27.7  26.0 - 34.0 (pg)   MCHC 31.6  30.0 - 36.0 (g/dL)   RDW 29.5 (*) 62.1 - 15.5 (%)   Platelets 407 (*) 150 - 400 (K/uL)  PROTIME-INR     Status: Abnormal   Collection Time   02/09/12  8:00 AM      Component Value Range   Prothrombin Time 28.5 (*) 11.6 - 15.2 (seconds)   INR 2.63 (*) 0.00 - 1.49     Studies/Results: No results found.  Medications: I have reviewed the patient's current medications.   Patient Active Hospital Problem List: NSTEMI:  Felt secondary to demand ischemia form hypoxia, volume overload.  Normal LV function in Dec.  Continue current regimen.  Has history of CAD  Hx PE:  On coumadin. Anemia (10/11/2011)   Assessment:    Acute-on-chronic respiratory failure (01/23/2012)   Assessment:  Improved with dialysis.    HCAP (healthcare-associated pneumonia) (01/23/2012)   Assessment: On ABX.   ESRD (end stage renal disease) on dialysis (01/23/2012)   Assessment: Dialysis.   Dyslipidemiea Not on a statin.  Check  lipids in AM   LOS: 5 days   Dietrich Pates 02/09/2012, 9:57 AM

## 2012-02-09 NOTE — Progress Notes (Signed)
   CARE MANAGEMENT NOTE 02/09/2012  Patient:  Focht,Emani   Account Number:  [id number]  Date Initiated:  02/05/2012  Documentation initiated by:  Letha Cape  Subjective/Objective Assessment:   dx pna, emphysema  admit- lives with spouse, has home oxygen with [employer] and [facility] with [facility].     Action/Plan:   Resume HHRN with [facility]  pt eval   Anticipated DC Date:  02/08/2012   Anticipated DC Plan:  HOME W HOME HEALTH SERVICES      DC Planning Services  CM consult      [facility] Choice  Resumption Of Svcs/PTA Provider   Choice offered to / List presented to:  C-1 Patient        HH arranged  HH-1 RN      [facility] agency  [facility]   Status of service:  In process, will continue to follow Medicare Important Message given?   (If response is "NO", the following Medicare IM given date fields will be blank) Date Medicare IM given:   Date Additional Medicare IM given:    Discharge Disposition:    Per UR Regulation:    If discussed at Long Length of Stay Meetings, dates discussed:    Comments:  PCP [place]  02/09/12 14:56 Letha Cape RN, BSN [phone] patient for possible dc today, will need to fax order for [facility] resumption to [phone] to [facility] alson with h/p and dc summary.  02/05/12 11:36 Letha Cape RN, BSN [phone] patient lives with spouse, has home oxygen with [employer], active with [facility] for [facility], will need to resume HHRN.   Patient would like to continue with [facility].  Await pt eval.

## 2012-02-09 NOTE — Progress Notes (Signed)
Subjective:   Seen on HD, mild nausea, but no vomiting  Objective: Vital signs in last 24 hours: Temp:  [96.7 F (35.9 C)-98.1 F (36.7 C)] 96.7 F (35.9 C) (04/08 0630) Pulse Rate:  [105-114] 107  (04/08 0715) Resp:  [17-20] 20  (04/08 0630) BP: (136-170)/(57-80) 136/63 mmHg (04/08 0715) SpO2:  [95 %-100 %] 100 % (04/08 0630) Weight:  [61.6 kg (135 lb 12.9 oz)-62.2 kg (137 lb 2 oz)] 62.2 kg (137 lb 2 oz) (04/08 0630) Weight change: -0.6 kg (-1 lb 5.2 oz)  Intake/Output from previous day: 04/07 0701 - 04/08 0700 In: 600 [P.O.:600] Out: -    EXAM: General appearance: Alert, in no apparent distress  Resp:  CTA bilaterally Cardio:  RRR without murmur GI: + BS, soft and nontender Extremities:  No edema Access:  AVF @ RUA with BFR 400 cc/min  Lab Results:  Basename 02/08/12 1314 02/07/12 0930  WBC 17.6* 26.3*  HGB 12.0 10.5*  HCT 37.4 33.4*  PLT 404* 484*   BMET:  Basename 02/07/12 0930  NA 131*  K 5.0  CL 93*  CO2 22  GLUCOSE 141*  BUN 42*  CREATININE 4.01*  CALCIUM 9.5  ALBUMIN 2.7*   No results found for this basename: PTH:2 in the last 72 hours Iron Studies: No results found for this basename: IRON,TIBC,TRANSFERRIN,FERRITIN in the last 72 hours  Assessment/Plan: 1.  Dyspnea - secondary to bronchitis, COPD, and vascular congestion/interstitial edema per chest-ray on 4/7; on Levaquin 750 mg PO on MWF. Better. 2.  ESRD - on HD on MWF, last K 5. 3.  Anemia - Hgb 12 on Aranesp 100 mcg on Wed and weekly IV Fe. 4.  HTN/Volume - BP controlled (114/64 currently), UF goal of 3 L today. 5.  Secondary hyperparathyroidism - Ca 9.5 (corrected 10.5) last P 3.2, on 2.5Ca bath, Zemplar 1 mcg per HD, Phoslo 1 with meals.  Renal panel pending today, will likely decrease bath. 6.  Nutrition - last Alb 2.7.   7. Dispo- home with HHN soon per primary   LOS: 5 days   LYLES,CHARLES 02/09/2012,7:49 AM  Patient seen and examined and agree with assessment and plan as above.  [name]  MD [facility] [phone] pgr    [phone] cell 02/09/2012, 3:47 PM

## 2012-02-10 LAB — GLUCOSE, CAPILLARY
Glucose-Capillary: 125 mg/dL — ABNORMAL HIGH (ref 70–99)
Glucose-Capillary: 147 mg/dL — ABNORMAL HIGH (ref 70–99)

## 2012-02-10 LAB — CBC
MCH: 27.8 pg (ref 26.0–34.0)
MCHC: 31.5 g/dL (ref 30.0–36.0)
MCV: 88.2 fL (ref 78.0–100.0)
Platelets: 382 10*3/uL (ref 150–400)
RDW: 20.2 % — ABNORMAL HIGH (ref 11.5–15.5)
WBC: 10.6 10*3/uL — ABNORMAL HIGH (ref 4.0–10.5)

## 2012-02-10 LAB — BASIC METABOLIC PANEL
BUN: 22 mg/dL (ref 6–23)
Calcium: 9.8 mg/dL (ref 8.4–10.5)
Chloride: 93 mEq/L — ABNORMAL LOW (ref 96–112)
Creatinine, Ser: 2.58 mg/dL — ABNORMAL HIGH (ref 0.50–1.10)
GFR calc Af Amer: 21 mL/min — ABNORMAL LOW (ref >90)
GFR calc non Af Amer: 18 mL/min — ABNORMAL LOW (ref >90)

## 2012-02-10 LAB — CULTURE, BLOOD (ROUTINE X 2): Culture  Setup Time: 201304032119

## 2012-02-10 LAB — LIPID PANEL: Cholesterol: 205 mg/dL — ABNORMAL HIGH (ref 0–200)

## 2012-02-10 LAB — PROTIME-INR: INR: 2.05 — ABNORMAL HIGH (ref 0.00–1.49)

## 2012-02-10 MED ORDER — REGADENOSON 0.4 MG/5ML IV SOLN
0.4000 mg | Freq: Once | INTRAVENOUS | Status: AC
  Administered 2012-02-11: 0.4 mg via INTRAVENOUS
  Filled 2012-02-10: qty 5

## 2012-02-10 MED ORDER — ALBUTEROL SULFATE (5 MG/ML) 0.5% IN NEBU: 2.5000 mg | INHALATION_SOLUTION | RESPIRATORY_TRACT | Status: DC | PRN

## 2012-02-10 MED ORDER — ISOSORBIDE MONONITRATE ER 60 MG PO TB24
120.0000 mg | ORAL_TABLET | Freq: Every day | ORAL | Status: DC
  Administered 2012-02-11 – 2012-02-14 (×4): 120 mg via ORAL
  Filled 2012-02-10 (×8): qty 2

## 2012-02-10 MED ORDER — LEVOFLOXACIN 750 MG PO TABS: 750.0000 mg | ORAL_TABLET | ORAL | Status: DC

## 2012-02-10 MED ORDER — SIMVASTATIN 10 MG PO TABS
10.0000 mg | ORAL_TABLET | Freq: Every day | ORAL | Status: DC
  Administered 2012-02-11 – 2012-02-13 (×3): 10 mg via ORAL
  Filled 2012-02-10 (×6): qty 1

## 2012-02-10 MED ORDER — WARFARIN SODIUM 2 MG PO TABS
2.0000 mg | ORAL_TABLET | Freq: Once | ORAL | Status: AC
  Administered 2012-02-10: 2 mg via ORAL
  Filled 2012-02-10: qty 1

## 2012-02-10 MED ORDER — HEPARIN SODIUM (PORCINE) 1000 UNIT/ML DIALYSIS
100.0000 [IU]/kg | INTRAMUSCULAR | Status: DC | PRN
  Filled 2012-02-10: qty 7

## 2012-02-10 NOTE — Progress Notes (Signed)
Subjective: No complaints, no nausea or vomiting today.  Objective: Vital signs in last 24 hours: Temp:  [97.4 F (36.3 C)-97.9 F (36.6 C)] 97.7 F (36.5 C) (04/09 0600) Pulse Rate:  [101-117] 108  (04/09 0600) Resp:  [18-23] 18  (04/09 0600) BP: (69-173)/(36-87) 157/87 mmHg (04/09 0600) SpO2:  [92 %-100 %] 92 % (04/09 0600) Weight:  [59.8 kg (131 lb 13.4 oz)-61.9 kg (136 lb 7.4 oz)] 61.9 kg (136 lb 7.4 oz) (04/08 2045) Weight change: -1.8 kg (-3 lb 15.5 oz)  Intake/Output from previous day: 04/08 0701 - 04/09 0700 In: 0  Out: 2400    EXAM: General appearance:  Alert, in no apparent distress Resp:  CTA bilaterally Cardio:  RRR without murmur GI:  + BS, soft and nontender Extremities:  No edema Access:  Right IJ catheter, also AVF @ RUA with + bruit  Lab Results:  [facility] 02/10/12 0605 02/09/12 0703  WBC 10.6* 14.8*  HGB 13.0 11.4*  HCT 41.3 36.1  PLT 382 407*   BMET:  Basename 02/10/12 0605 02/09/12 1611 02/07/12 0930  NA 134* 133* --  K 4.7 4.2 --  CL 93* 92* --  CO2 25 28 --  GLUCOSE 131* 286* --  BUN 22 14 --  CREATININE 2.58* 1.82* --  CALCIUM 9.8 9.2 --  ALBUMIN -- -- 2.7*   No results found for this basename: PTH:2 in the last 72 hours Iron Studies: No results found for this basename: IRON,TIBC,TRANSFERRIN,FERRITIN in the last 72 hours  Assessment/Plan: 1.  Dyspnea - secondary to bronchitis, COPD, and vascular congestion/interstitial edema per chest-ray on 4/7; on Levaquin 750 mg PO on MWF. Better.  2.  ESRD - on HD on MWF, K 4.7 today.  Next HD tomorrow. 3.  Anemia - Hgb 13 on Aranesp 100 mcg on Wed and weekly IV Fe.  Hold Aranesp tomorrow.  4.  HTN/Volume - BP fluctuates (157/87 recently); net UF of 2.4 L yesterday (post-HD wt 61.9 kg).  5.  Secondary hyperparathyroidism - Ca 9.8 (corrected 10.8) last P 3.2, on 2.5Ca bath, Zemplar 1 mcg per HD, Phoslo 1 with meals. Use 2Ca bath tomorrow, dc Zemplar.  6.  Nutrition - last Alb 2.7.  7.  Dispo- home  with HHN soon per primary.     LOS: 6 days   [name] [name],[name] [name] 02/10/2012,7:59 AM  Patient seen and examined and agree with assessment and plan as above.  [name]  MD [facility] [phone] pgr    [phone] cell 02/10/2012, 3:16 PM

## 2012-02-10 NOTE — Progress Notes (Signed)
Utilization review completed.  

## 2012-02-10 NOTE — Progress Notes (Signed)
Subjective:  no chest pain or sob.  Objective: Weight change: -1.8 kg (-3 lb 15.5 oz)  Intake/Output Summary (Last 24 hours) at 02/10/12 2054 Last data filed at 02/10/12 2015  Gross per 24 hour  Intake    720 ml  Output    125 ml  Net    595 ml    Filed Vitals:   02/10/12 2015  BP: 159/73  Pulse: 109  Temp: 98.1 F (36.7 C)  Resp: 18   On exam  SHe is alert afebrile in mild distress  CVS s1s2 heard tachycardic  Lungs clear  Abdomen soft non tender non distended bowel sounds heard  Extremities: no pedal edema.    Lab Results: Results for orders placed during the hospital encounter of 02/04/12 (from the past 24 hour(s))  GLUCOSE, CAPILLARY     Status: Abnormal   Collection Time   02/09/12  9:18 PM      Component Value Range   Glucose-Capillary 207 (*) 70 - 99 (mg/dL)   Comment 1 Notify RN     Comment 2 Documented in Chart    GLUCOSE, CAPILLARY     Status: Abnormal   Collection Time   02/10/12 12:01 AM      Component Value Range   Glucose-Capillary 251 (*) 70 - 99 (mg/dL)   Comment 1 Notify RN     Comment 2 Documented in Chart    GLUCOSE, CAPILLARY     Status: Abnormal   Collection Time   02/10/12  4:08 AM      Component Value Range   Glucose-Capillary 169 (*) 70 - 99 (mg/dL)  PROTIME-INR     Status: Abnormal   Collection Time   02/10/12  6:05 AM      Component Value Range   Prothrombin Time 23.5 (*) 11.6 - 15.2 (seconds)   INR 2.05 (*) 0.00 - 1.49   CBC     Status: Abnormal   Collection Time   02/10/12  6:05 AM      Component Value Range   WBC 10.6 (*) 4.0 - 10.5 (K/uL)   RBC 4.68  3.87 - 5.11 (MIL/uL)   Hemoglobin 13.0  12.0 - 15.0 (g/dL)   HCT 16.1  09.6 - 04.5 (%)   MCV 88.2  78.0 - 100.0 (fL)   MCH 27.8  26.0 - 34.0 (pg)   MCHC 31.5  30.0 - 36.0 (g/dL)   RDW 40.9 (*) 81.1 - 15.5 (%)   Platelets 382  150 - 400 (K/uL)  BASIC METABOLIC PANEL     Status: Abnormal   Collection Time   02/10/12  6:05 AM      Component Value Range   Sodium 134 (*) 135 - 145  (mEq/L)   Potassium 4.7  3.5 - 5.1 (mEq/L)   Chloride 93 (*) 96 - 112 (mEq/L)   CO2 25  19 - 32 (mEq/L)   Glucose, Bld 131 (*) 70 - 99 (mg/dL)   BUN 22  6 - 23 (mg/dL)   Creatinine, Ser 9.14 (*) 0.50 - 1.10 (mg/dL)   Calcium 9.8  8.4 - 78.2 (mg/dL)   GFR calc non Af Amer 18 (*) >90 (mL/min)   GFR calc Af Amer 21 (*) >90 (mL/min)  LIPID PANEL     Status: Abnormal   Collection Time   02/10/12  6:05 AM      Component Value Range   Cholesterol 205 (*) 0 - 200 (mg/dL)   Triglycerides 956  <213 (mg/dL)   HDL  75  >39 (mg/dL)   Total CHOL/HDL Ratio 2.7     VLDL 20  0 - 40 (mg/dL)   LDL Cholesterol 161 (*) 0 - 99 (mg/dL)  GLUCOSE, CAPILLARY     Status: Abnormal   Collection Time   02/10/12  7:58 AM      Component Value Range   Glucose-Capillary 126 (*) 70 - 99 (mg/dL)   Comment 1 Documented in Chart     Comment 2 Notify RN    GLUCOSE, CAPILLARY     Status: Abnormal   Collection Time   02/10/12 11:55 AM      Component Value Range   Glucose-Capillary 125 (*) 70 - 99 (mg/dL)  GLUCOSE, CAPILLARY     Status: Abnormal   Collection Time   02/10/12  4:51 PM      Component Value Range   Glucose-Capillary 115 (*) 70 - 99 (mg/dL)  GLUCOSE, CAPILLARY     Status: Abnormal   Collection Time   02/10/12  8:10 PM      Component Value Range   Glucose-Capillary 147 (*) 70 - 99 (mg/dL)   Comment 1 Documented in Chart     Comment 2 Notify RN       Micro Results: Recent Results (from the past 240 hour(s))  CULTURE, BLOOD (ROUTINE X 2)     Status: Normal   Collection Time   02/04/12  3:40 PM      Component Value Range Status Comment   Specimen Description BLOOD FINGER LEFT   Final    Special Requests BOTTLES DRAWN AEROBIC ONLY 4CC   Final    Culture  Setup Time [id number]   Final    Culture NO GROWTH 5 DAYS   Final    Report Status 02/10/2012 FINAL   Final   CULTURE, BLOOD (ROUTINE X 2)     Status: Normal   Collection Time   02/04/12  4:00 PM      Component Value Range Status Comment   Specimen  Description BLOOD ARM LEFT   Final    Special Requests BOTTLES DRAWN AEROBIC ONLY [facility]   Final    Culture  Setup Time [id number]   Final    Culture NO GROWTH 5 DAYS   Final    Report Status 02/10/2012 FINAL   Final   MRSA PCR SCREENING     Status: Normal   Collection Time   02/06/12  2:25 AM      Component Value Range Status Comment   MRSA by PCR NEGATIVE  NEGATIVE  Final     Studies/Results: Dg Chest 2 View  02/06/2012  *RADIOLOGY REPORT*  Clinical Data: Short of breath, pulmonary edema, former smoking history, diabetes  CHEST - 2 VIEW  Comparison: Portable chest x-ray of 02/06/2012  Findings: There has been some improvement in the edema pattern. Mild pulmonary vascular congestion remains with small pleural effusions.  Cardiomegaly is stable.  The right central venous line is unchanged with the tips in the lower SVC near the expected right atrial junction.  IMPRESSION: Improvement in edema pattern.  Small effusions and mild cardiomegaly remain.  Original Report Authenticated By: Juline Patch, M.D.   Dg Chest 2 View  02/04/2012  *RADIOLOGY REPORT*  Clinical Data: Recent pneumonia  CHEST - 2 VIEW  Comparison: 01/24/2012  Findings: Cardiomediastinal silhouette is stable.  Dual lumen right IJ catheter is unchanged in position.  There is a small right pleural effusion with right basilar atelectasis or infiltrate.  New patchy  airspace disease right upper lobe perihilar suspicious for pneumonia. Left lung is clear.  No pulmonary edema.  IMPRESSION:  There is a small right pleural effusion with right basilar atelectasis or infiltrate.  New patchy airspace disease right upper lobe perihilar suspicious for pneumonia.  Original Report Authenticated By: Natasha Mead, M.D.   Dg Chest Port 1 View  02/07/2012  *RADIOLOGY REPORT*  Clinical Data: 71 year old female with pulmonary edema.  Acute-on- chronic respiratory failure.  Bilateral pleural effusions.  PORTABLE CHEST - 1 VIEW  Comparison: 02/06/2012 and earlier.   Findings: Portable semi upright AP view 0708 hours.  Stable right IJ approach dual lumen dialysis type catheter.  Stable cardiac size and mediastinal contours.  Worsening airspace opacity at both lung bases.  Superimposed veiling opacity compatible with small to moderate effusions.  No pneumothorax.  Vascular congestion is increased.  IMPRESSION: Interval worsening vascular congestion/interstitial edema, small to moderate bilateral effusions, and bibasilar ventilation.  Original Report Authenticated By: Harley Hallmark, M.D.   Dg Chest Port 1 View  02/06/2012  *RADIOLOGY REPORT*  Clinical Data: Respiratory distress.  PORTABLE CHEST - 1 VIEW  Comparison: 02/04/2012  Findings: Enlarged heart size with prominent pulmonary vascularity. Interval development of increased perihilar air space disease and infiltration or atelectasis in the lung bases.  Small bilateral pleural effusions.  Changes suggest developing congestive failure or fluid overload.  Right central venous catheter remains unchanged in position.  Previously demonstrated right lung airspace disease is obscured by the more confluent process today.  No pneumothorax.  IMPRESSION: Increasing congestive changes in the heart and lungs with bilateral pleural effusions and diffuse pulmonary edema.  Original Report Authenticated By: Marlon Pel, M.D.   Portable Chest Xray In Am  01/24/2012  *RADIOLOGY REPORT*  Clinical Data: Pneumonia  PORTABLE CHEST - 1 VIEW  Comparison: 01/23/2012; 10/23/2011; chest CT - 10/22/2011  Findings: Unchanged cardiac silhouette and mediastinal contours. Stable positioning of support apparatus.  Grossly unchanged right mid and lower lung heterogeneous air space opacities.  Grossly unchanged left basilar/retrocardiac heterogeneous opacities.  The pulmonary vasculature remains indistinct.  Persistent blunting of the right costophrenic angle suggesting a small right-sided pleural effusion. No pneumothorax.  Unchanged bones.   IMPRESSION: 1.  Grossly unchanged right mid and lower lung heterogeneous air space opacity worrisome for infection. 2.  Grossly unchanged left basilar/retrocardiac opacity, possibly atelectasis. 3.  Unchanged findings of mild pulmonary edema and small right- sided effusion.  Original Report Authenticated By: Waynard Reeds, M.D.   Portable Chest Xray  01/23/2012  *RADIOLOGY REPORT*  Clinical Data: Increased shortness of breath  PORTABLE CHEST - 1 VIEW  Comparison: Portable chest x-ray of 01/23/2012  Findings: There has been some improvement in airspace disease at the right lung base and in the right mid lung.  A small right effusion remains.  Mild cardiomegaly is stable.  There is still evidence of mild pulmonary vascular congestion.  Right central venous line is unchanged in position.  IMPRESSION: Some improvement in airspace disease.  Persistent cardiomegaly, right effusion, and mild pulmonary vascular congestion.  Original Report Authenticated By: Juline Patch, M.D.   Medications: Scheduled Meds:   . acidophilus  1 capsule Oral BID WC  . aspirin  81 mg Oral Daily  . calcium acetate  667 mg Oral TID WC  . ferric gluconate (FERRLECIT/NULECIT) IV  62.5 mg Intravenous Weekly  . insulin aspart  0-9 Units Subcutaneous Q4H  . insulin glargine  10 Units Subcutaneous QHS  . isosorbide mononitrate  120 mg Oral Daily  . levofloxacin  750 mg Oral Q M,W,F-2000  . levothyroxine  25 mcg Oral QAC breakfast  . multivitamin  1 tablet Oral QHS  . pantoprazole  40 mg Oral Daily  . regadenoson  0.4 mg Intravenous Once  . simvastatin  10 mg Oral q1800  . sodium chloride  3 mL Intravenous Q12H  . warfarin  2 mg Oral ONCE-1800  . Warfarin - Pharmacist Dosing Inpatient   Does not apply q1800  . DISCONTD: albuterol  2.5 mg Nebulization TID  . DISCONTD: darbepoetin (ARANESP) injection - DIALYSIS  100 mcg Intravenous Q Wed-HD  . DISCONTD: ipratropium  0.5 mg Nebulization TID  . DISCONTD: paricalcitol  1 mcg  Intravenous Q M,W,F-HD   Continuous Infusions:  PRN Meds:.sodium chloride, acetaminophen, acetaminophen, albuterol, calcium carbonate (dosed in mg elemental calcium), camphor-menthol, docusate sodium, feeding supplement (NEPRO CARB STEADY), guaiFENesin-dextromethorphan, heparin, hydrOXYzine, nitroGLYCERIN, ondansetron (ZOFRAN) IV, ondansetron, sodium chloride, sorbitol   Initial Presentation: 71 year old lady with h/o copd on oxygen at home, who was recently discharged from the hospital 10 days ago after being treated for health care associated pneumonia and UTI, comes back for sob and worsening cough. CXR showed new pneumonia on RUL and RLL. She was admitted and treated again for HCAP. On 4/5, she went into acute respiratory failure secondary to  pulmonary edema. She was seen by PCCM, and was transferred to ICU. She underwent HD, excess fluid abotu 3.7 lit was taken out. She was transferred back to [facility]. Her cardiac enzymes were elevated on 4/6, cardiology consult was obtained. 2 d echocardiogram was ordered, showed systolic and diastolic dysfunction with wall abnormalities. She is currently on schedule for Myoview scan for tomorrow.   Assessment/Plan: Patient Active Hospital Problem List: 1. Acute respiratory failure: secondary to a combination of pulmonary edema, copd and bronchitis. On levaquin for bronchitis. Continue with nebs and oxygen.   2. Leukocytosis: improving. Probably secondary to steroids that were given initally after admission.   3.  NSTEMI; Echo showing LV systolic dysfunction. Cardiology called back, plan for Stress test in am. Continue with aspirin.  Tachycardia, no b blockers secondary to COPD. 4. ESRD : ON HD further management as per renal.   5.  Nausea and vomiting: resolved.  6. DM: on lantus and SSI. 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 INR is subtherapeutic.  8. An episode of  hematuria: will send for UA, when she voids. Full code      LOS: 6 days   [name] [name] 02/10/2012, 8:54 PM

## 2012-02-10 NOTE — Progress Notes (Signed)
Dr. Blake Divine notified of hematuria. To obtain UA if voids again.

## 2012-02-10 NOTE — Progress Notes (Signed)
   CARE MANAGEMENT NOTE 02/10/2012  Patient:  Cragin,Kalani   Account Number:  [id number]  Date Initiated:  02/05/2012  Documentation initiated by:  Letha Cape  Subjective/Objective Assessment:   dx pna, emphysema  admit- lives with spouse, has home oxygen with [employer] and [facility] with [facility].     Action/Plan:   Resume HHRN with [facility]  pt eval   Anticipated DC Date:  02/08/2012   Anticipated DC Plan:  HOME W HOME HEALTH SERVICES      DC Planning Services  CM consult      [facility] Choice  Resumption Of Svcs/PTA Provider   Choice offered to / List presented to:  C-1 Patient        HH arranged  HH-1 RN      [facility] agency  [facility] HEALTH   Status of service:  Completed, signed off Medicare Important Message given?   (If response is "NO", the following Medicare IM given date fields will be blank) Date Medicare IM given:   Date Additional Medicare IM given:    Discharge Disposition:  HOME W HOME HEALTH SERVICES  Per UR Regulation:    If discussed at Long Length of Stay Meetings, dates discussed:    Comments:  PCP Hopper  02/10/12 11:46 Letha Cape RN, BSN [phone] patient is for dc today, waiting for order to fax to hh agency for hhrn resumption, will need to be faxed to [facility] at [phone].  02/09/12 14:56 Letha Cape RN, BSN [phone] patient for possible dc today, will need to fax order for [facility] resumption to [phone] to [facility] alson with h/p and dc summary.  02/05/12 11:36 Letha Cape RN, BSN [phone] patient lives with spouse, has home oxygen with [employer], active with [facility] for [facility], will need to resume HHRN.   Patient would like to continue with [facility].  Await pt eval.

## 2012-02-10 NOTE — Progress Notes (Signed)
ANTICOAGULATION CONSULT NOTE - Follow Up Consult  Pharmacy Consult for Coumadin Indication: Hx DVT/PE  Assessment: 22 yoF on coumadin PTA for hx DVT/PE.  INR today 2.05.  Hgb low, stable.  No bleeding noted.   Goal of Therapy:  INR 2.0-3.0   Plan:  1.  Coumadin 2 mg today 2.  F/u daily PT/INR Talbert Cage, PharmD    Allergies  Allergen Reactions  . Novocain Other (See Comments)    Unknown    Patient Measurements: Height: 5\' 5"  (165.1 cm) Weight: 136 lb 7.4 oz (61.9 kg) IBW/kg (Calculated) : 57  Heparin Dosing Weight:   Vital Signs: Temp: 97.7 F (36.5 C) (04/09 0600) Temp src: Oral (04/09 0600) BP: 157/87 mmHg (04/09 0600) Pulse Rate: 108  (04/09 0600)  Labs:  Basename 02/10/12 0605 02/09/12 1611 02/09/12 0800 02/09/12 0703 02/08/12 1314 02/08/12 0539 02/07/12 0930  HGB 13.0 -- -- 11.4* -- -- --  HCT 41.3 -- -- 36.1 37.4 -- --  PLT 382 -- -- 407* 404* -- --  APTT -- -- -- -- -- -- --  LABPROT 23.5* -- 28.5* -- -- 21.7* --  INR 2.05* -- 2.63* -- -- 1.85* --  HEPARINUNFRC -- -- -- -- -- -- --  CREATININE 2.58* 1.82* -- 4.91* -- -- --  CKTOTAL -- -- -- -- -- -- 42  CKMB -- -- -- -- -- -- 4.5*  TROPONINI -- -- -- -- -- -- 3.81*   Estimated Creatinine Clearance: 18.8 ml/min (by C-G formula based on Cr of 2.58).

## 2012-02-10 NOTE — Progress Notes (Signed)
    Subjective:  No chest pain. Dyspnea improved. No other complaints today.  Objective:  Vital Signs in the last 24 hours: Temp:  [97.7 F (36.5 C)-97.9 F (36.6 C)] 97.8 F (36.6 C) (04/09 1300) Pulse Rate:  [108-117] 114  (04/09 1300) Resp:  [17-20] 17  (04/09 1300) BP: (155-173)/(51-87) 156/51 mmHg (04/09 1300) SpO2:  [92 %-100 %] 97 % (04/09 1300) Weight:  [61.9 kg (136 lb 7.4 oz)] 61.9 kg (136 lb 7.4 oz) (04/08 2045)  Intake/Output from previous day: 04/08 0701 - 04/09 0700 In: 0  Out: 2400   Physical Exam: Pt is alert and oriented, chronically ill-appearing woman in NAD HEENT: normal Neck: JVP - normal, carotids 2+= without bruits Lungs: poor air movement bilaterally CV: RRR without murmur or gallop Abd: soft, NT, Positive BS, no hepatomegaly Ext: no C/C/E Skin: warm/dry no rash  Lab Results:  Basename 02/10/12 0605 02/09/12 0703  WBC 10.6* 14.8*  HGB 13.0 11.4*  PLT 382 407*    Basename 02/10/12 0605 02/09/12 1611  NA 134* 133*  K 4.7 4.2  CL 93* 92*  CO2 25 28  GLUCOSE 131* 286*  BUN 22 14  CREATININE 2.58* 1.82*   No results found for this basename: TROPONINI:2,CK,MB:2 in the last 72 hours  Cardiac Studies:Study Conclusions  - Left ventricle: Distal septal and apical severe hypokinesis inferior wall hypokinesis The cavity size was mildly dilated. There was mild concentric hypertrophy. The estimated ejection fraction was 40%. - Mitral valve: Mild regurgitation. - Atrial septum: No defect or patent foramen ovale was identified. - Pulmonary arteries: PA peak pressure: 46mm Hg (S). - Pericardium, extracardiac: A trivial pericardial effusion was identified.  Assessment/Plan:  1. NSTEMI - Segmental LV dysfunction noted - this is new from previous echo studies. The patient has not had any chest pain, only dyspnea now improved volume removal and treatment of community-acquired pneumonia. 2. ESRD 3. Dyslipidemia - LDL 100  Difficult situation in  this patient with O2 dependent COPD and ESRD. Her wall motion abnormality is new and may be related to obstructive CAD versus Takotsubo cardiomyopathy. I would favor a Lexiscan Stress Myoview and if low-risk or fixed-defect would treat conservatively. If high-risk or significant ischemia she will need cath. Will follow-up after stress test (ordered for tomorrow AM). Add low-dose statin for presumed CAD. Continue ASA at low-dose. I would not start plavix because of need for long-term warfarin.  Tonny Bollman, M.D. 02/10/2012, 4:23 PM

## 2012-02-11 ENCOUNTER — Inpatient Hospital Stay (HOSPITAL_COMMUNITY): Payer: Medicare Other

## 2012-02-11 DIAGNOSIS — R079 Chest pain, unspecified: Secondary | ICD-10-CM

## 2012-02-11 LAB — GLUCOSE, CAPILLARY
Glucose-Capillary: 104 mg/dL — ABNORMAL HIGH (ref 70–99)
Glucose-Capillary: 177 mg/dL — ABNORMAL HIGH (ref 70–99)
Glucose-Capillary: 198 mg/dL — ABNORMAL HIGH (ref 70–99)

## 2012-02-11 LAB — CBC
HCT: 36.9 % (ref 36.0–46.0)
Hemoglobin: 11.6 g/dL — ABNORMAL LOW (ref 12.0–15.0)
MCH: 27.6 pg (ref 26.0–34.0)
MCHC: 31.4 g/dL (ref 30.0–36.0)

## 2012-02-11 LAB — RENAL FUNCTION PANEL
BUN: 41 mg/dL — ABNORMAL HIGH (ref 6–23)
Calcium: 9 mg/dL (ref 8.4–10.5)
Glucose, Bld: 199 mg/dL — ABNORMAL HIGH (ref 70–99)
Phosphorus: 4.8 mg/dL — ABNORMAL HIGH (ref 2.3–4.6)
Potassium: 4.2 mEq/L (ref 3.5–5.1)

## 2012-02-11 LAB — PROTIME-INR: Prothrombin Time: 24.2 seconds — ABNORMAL HIGH (ref 11.6–15.2)

## 2012-02-11 MED ORDER — SODIUM CHLORIDE 0.9 % IV SOLN
62.5000 mg | INTRAVENOUS | Status: DC
  Administered 2012-02-11: 62.5 mg via INTRAVENOUS
  Filled 2012-02-11 (×2): qty 5

## 2012-02-11 MED ORDER — TECHNETIUM TC 99M TETROFOSMIN IV KIT
30.0000 | PACK | Freq: Once | INTRAVENOUS | Status: AC | PRN
  Administered 2012-02-11: 30 via INTRAVENOUS

## 2012-02-11 MED ORDER — SODIUM CHLORIDE 0.9 % IV SOLN: 250.0000 mL | INTRAVENOUS | Status: DC | PRN

## 2012-02-11 MED ORDER — SODIUM CHLORIDE 0.9 % IJ SOLN
3.0000 mL | Freq: Two times a day (BID) | INTRAMUSCULAR | Status: DC
  Administered 2012-02-11 – 2012-02-12 (×3): 3 mL via INTRAVENOUS

## 2012-02-11 MED ORDER — WARFARIN SODIUM 2 MG PO TABS
2.0000 mg | ORAL_TABLET | Freq: Every day | ORAL | Status: DC
  Filled 2012-02-11 (×3): qty 1

## 2012-02-11 MED ORDER — SODIUM CHLORIDE 0.9 % IJ SOLN: 3.0000 mL | INTRAMUSCULAR | Status: DC | PRN

## 2012-02-11 MED ORDER — TECHNETIUM TC 99M TETROFOSMIN IV KIT
10.0000 | PACK | Freq: Once | INTRAVENOUS | Status: AC | PRN
  Administered 2012-02-11: 10 via INTRAVENOUS

## 2012-02-11 MED ORDER — DIAZEPAM 5 MG PO TABS: 5.0000 mg | ORAL_TABLET | ORAL | Status: DC

## 2012-02-11 NOTE — Progress Notes (Signed)
Subjective: NO complains  Objective: Weight change: -0.3 kg (-10.6 oz)  Intake/Output Summary (Last 24 hours) at 02/11/12 1412 Last data filed at 02/11/12 0900  Gross per 24 hour  Intake    360 ml  Output    275 ml  Net     85 ml    Filed Vitals:   02/11/12 1015  BP: 110/58  Pulse: 114  Temp:   Resp:    On exam  SHe is alert afebrile in mild distress  CVS s1s2 heard tachycardic  Lungs clear  Abdomen soft non tender non distended bowel sounds heard  Extremities: no pedal edema.    Lab Results: Results for orders placed during the hospital encounter of 02/04/12 (from the past 24 hour(s))  GLUCOSE, CAPILLARY     Status: Abnormal   Collection Time   02/10/12  4:51 PM      Component Value Range   Glucose-Capillary 115 (*) 70 - 99 (mg/dL)  GLUCOSE, CAPILLARY     Status: Abnormal   Collection Time   02/10/12  8:10 PM      Component Value Range   Glucose-Capillary 147 (*) 70 - 99 (mg/dL)   Comment 1 Documented in Chart     Comment 2 Notify RN    GLUCOSE, CAPILLARY     Status: Abnormal   Collection Time   02/10/12 11:39 PM      Component Value Range   Glucose-Capillary 119 (*) 70 - 99 (mg/dL)   Comment 1 Notify RN     Comment 2 Documented in Chart    GLUCOSE, CAPILLARY     Status: Abnormal   Collection Time   02/11/12  4:31 AM      Component Value Range   Glucose-Capillary 107 (*) 70 - 99 (mg/dL)   Comment 1 Documented in Chart     Comment 2 Notify RN    GLUCOSE, CAPILLARY     Status: Abnormal   Collection Time   02/11/12  7:40 AM      Component Value Range   Glucose-Capillary 104 (*) 70 - 99 (mg/dL)  GLUCOSE, CAPILLARY     Status: Abnormal   Collection Time   02/11/12 11:52 AM      Component Value Range   Glucose-Capillary 177 (*) 70 - 99 (mg/dL)     Micro Results: Recent Results (from the past 240 hour(s))  CULTURE, BLOOD (ROUTINE X 2)     Status: Normal   Collection Time   02/04/12  3:40 PM      Component Value Range Status Comment   Specimen Description  BLOOD FINGER LEFT   Final    Special Requests BOTTLES DRAWN AEROBIC ONLY 4CC   Final    Culture  Setup Time [id number]   Final    Culture NO GROWTH 5 DAYS   Final    Report Status 02/10/2012 FINAL   Final   CULTURE, BLOOD (ROUTINE X 2)     Status: Normal   Collection Time   02/04/12  4:00 PM      Component Value Range Status Comment   Specimen Description BLOOD ARM LEFT   Final    Special Requests BOTTLES DRAWN AEROBIC ONLY [facility]   Final    Culture  Setup Time [id number]   Final    Culture NO GROWTH 5 DAYS   Final    Report Status 02/10/2012 FINAL   Final   MRSA PCR SCREENING     Status: Normal   Collection Time  02/06/12  2:25 AM      Component Value Range Status Comment   MRSA by PCR NEGATIVE  NEGATIVE  Final     Studies/Results: Dg Chest 2 View  02/06/2012  *RADIOLOGY REPORT*  Clinical Data: Short of breath, pulmonary edema, former smoking history, diabetes  CHEST - 2 VIEW  Comparison: Portable chest x-ray of 02/06/2012  Findings: There has been some improvement in the edema pattern. Mild pulmonary vascular congestion remains with small pleural effusions.  Cardiomegaly is stable.  The right central venous line is unchanged with the tips in the lower SVC near the expected right atrial junction.  IMPRESSION: Improvement in edema pattern.  Small effusions and mild cardiomegaly remain.  Original Report Authenticated By: Juline Patch, M.D.   Dg Chest 2 View  02/04/2012  *RADIOLOGY REPORT*  Clinical Data: Recent pneumonia  CHEST - 2 VIEW  Comparison: 01/24/2012  Findings: Cardiomediastinal silhouette is stable.  Dual lumen right IJ catheter is unchanged in position.  There is a small right pleural effusion with right basilar atelectasis or infiltrate.  New patchy airspace disease right upper lobe perihilar suspicious for pneumonia. Left lung is clear.  No pulmonary edema.  IMPRESSION:  There is a small right pleural effusion with right basilar atelectasis or infiltrate.  New patchy airspace  disease right upper lobe perihilar suspicious for pneumonia.  Original Report Authenticated By: Natasha Mead, M.D.   Dg Chest Port 1 View  02/07/2012  *RADIOLOGY REPORT*  Clinical Data: 71 year old female with pulmonary edema.  Acute-on- chronic respiratory failure.  Bilateral pleural effusions.  PORTABLE CHEST - 1 VIEW  Comparison: 02/06/2012 and earlier.  Findings: Portable semi upright AP view 0708 hours.  Stable right IJ approach dual lumen dialysis type catheter.  Stable cardiac size and mediastinal contours.  Worsening airspace opacity at both lung bases.  Superimposed veiling opacity compatible with small to moderate effusions.  No pneumothorax.  Vascular congestion is increased.  IMPRESSION: Interval worsening vascular congestion/interstitial edema, small to moderate bilateral effusions, and bibasilar ventilation.  Original Report Authenticated By: Harley Hallmark, M.D.   Dg Chest Port 1 View  02/06/2012  *RADIOLOGY REPORT*  Clinical Data: Respiratory distress.  PORTABLE CHEST - 1 VIEW  Comparison: 02/04/2012  Findings: Enlarged heart size with prominent pulmonary vascularity. Interval development of increased perihilar air space disease and infiltration or atelectasis in the lung bases.  Small bilateral pleural effusions.  Changes suggest developing congestive failure or fluid overload.  Right central venous catheter remains unchanged in position.  Previously demonstrated right lung airspace disease is obscured by the more confluent process today.  No pneumothorax.  IMPRESSION: Increasing congestive changes in the heart and lungs with bilateral pleural effusions and diffuse pulmonary edema.  Original Report Authenticated By: Marlon Pel, M.D.   Portable Chest Xray In Am  01/24/2012  *RADIOLOGY REPORT*  Clinical Data: Pneumonia  PORTABLE CHEST - 1 VIEW  Comparison: 01/23/2012; 10/23/2011; chest CT - 10/22/2011  Findings: Unchanged cardiac silhouette and mediastinal contours. Stable positioning of  support apparatus.  Grossly unchanged right mid and lower lung heterogeneous air space opacities.  Grossly unchanged left basilar/retrocardiac heterogeneous opacities.  The pulmonary vasculature remains indistinct.  Persistent blunting of the right costophrenic angle suggesting a small right-sided pleural effusion. No pneumothorax.  Unchanged bones.  IMPRESSION: 1.  Grossly unchanged right mid and lower lung heterogeneous air space opacity worrisome for infection. 2.  Grossly unchanged left basilar/retrocardiac opacity, possibly atelectasis. 3.  Unchanged findings of mild pulmonary edema and small  right- sided effusion.  Original Report Authenticated By: Waynard Reeds, M.D.   Portable Chest Xray  01/23/2012  *RADIOLOGY REPORT*  Clinical Data: Increased shortness of breath  PORTABLE CHEST - 1 VIEW  Comparison: Portable chest x-ray of 01/23/2012  Findings: There has been some improvement in airspace disease at the right lung base and in the right mid lung.  A small right effusion remains.  Mild cardiomegaly is stable.  There is still evidence of mild pulmonary vascular congestion.  Right central venous line is unchanged in position.  IMPRESSION: Some improvement in airspace disease.  Persistent cardiomegaly, right effusion, and mild pulmonary vascular congestion.  Original Report Authenticated By: Juline Patch, M.D.   Medications: Scheduled Meds:    . acidophilus  1 capsule Oral BID WC  . aspirin  81 mg Oral Daily  . calcium acetate  667 mg Oral TID WC  . ferric gluconate (FERRLECIT/NULECIT) IV  62.5 mg Intravenous Weekly  . insulin aspart  0-9 Units Subcutaneous Q4H  . insulin glargine  10 Units Subcutaneous QHS  . isosorbide mononitrate  120 mg Oral Daily  . levofloxacin  750 mg Oral Q M,W,F-2000  . levothyroxine  25 mcg Oral QAC breakfast  . multivitamin  1 tablet Oral QHS  . pantoprazole  40 mg Oral Daily  . regadenoson  0.4 mg Intravenous Once  . simvastatin  10 mg Oral q1800  . sodium  chloride  3 mL Intravenous Q12H  . warfarin  2 mg Oral ONCE-1800  . Warfarin - Pharmacist Dosing Inpatient   Does not apply q1800   Continuous Infusions:  PRN Meds:.sodium chloride, acetaminophen, acetaminophen, albuterol, calcium carbonate (dosed in mg elemental calcium), camphor-menthol, docusate sodium, feeding supplement (NEPRO CARB STEADY), guaiFENesin-dextromethorphan, heparin, hydrOXYzine, nitroGLYCERIN, ondansetron (ZOFRAN) IV, ondansetron, sodium chloride, sorbitol, technetium tetrofosmin, technetium tetrofosmin   Initial Presentation: 71 year old lady with h/o copd on oxygen at home, who was recently discharged from the hospital 10 days ago after being treated for health care associated pneumonia and UTI, comes back for sob and worsening cough. CXR showed new pneumonia on RUL and RLL. She was admitted and treated again for HCAP. On 4/5, she went into acute respiratory failure secondary to  pulmonary edema. She was seen by PCCM, and was transferred to ICU. She underwent HD, excess fluid abotu 3.7 lit was taken out. She was transferred back to [facility]. Her cardiac enzymes were elevated on 4/6, cardiology consult was obtained. 2 d echocardiogram was ordered, showed systolic and diastolic dysfunction with wall abnormalities. She is currently on schedule for Myoview scan for tomorrow.   Assessment/Plan: Patient Active Hospital Problem List:  Acute respiratory failure:  -secondary to a combination of pulmonary edema and NSTEMI, copd and bronchitis. On levaquin for bronchitis. Continue with nebs and oxygen.   Leukocytosis:  improving. Probably secondary to steroids that were given initally after admission.    NSTEMI: -Echo showing LV systolic dysfunction. Myoview done results pending. Continue with aspirin.  -Myoview showed inferior ischemia, will discuss with cardiology further procedure.  ESRD: On HD further management as per renal.   Nausea and vomiting:  resolved.   DM:  On lantus  and SSI. 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   Full code   LOS: 7 days   [name] [name], ABRAHAM 02/11/2012, 2:12 PM

## 2012-02-11 NOTE — Progress Notes (Signed)
   SUBJECTIVE:  Denies chest pain   PHYSICAL EXAM Filed Vitals:   02/10/12 1300 02/10/12 1655 02/10/12 2015 02/11/12 0446  BP: 156/51 180/75 159/73   Pulse: 114 109 109   Temp: 97.8 F (36.6 C) 98.4 F (36.9 C) 98.1 F (36.7 C)   TempSrc: Oral Oral Oral   Resp: 17 16 18    Height:   5\' 5"  (1.651 m)   Weight:   131 lb 2.8 oz (59.5 kg) 131 lb 2.8 oz (59.5 kg)  SpO2: 97% 99% 95%    General:  No disteress Lungs:  Clear Heart:  RRR, no rub Abdomen:  Positive bowel sounds, no rebound no guarding Extremities:  No edema  LABS: Lab Results  Component Value Date   CKTOTAL 42 02/07/2012   CKMB 4.5* 02/07/2012   TROPONINI 3.81* 02/07/2012   Results for orders placed during the hospital encounter of 02/04/12 (from the past 24 hour(s))  GLUCOSE, CAPILLARY     Status: Abnormal   Collection Time   02/10/12  7:58 AM      Component Value Range   Glucose-Capillary 126 (*) 70 - 99 (mg/dL)   Comment 1 Documented in Chart     Comment 2 Notify RN    GLUCOSE, CAPILLARY     Status: Abnormal   Collection Time   02/10/12 11:55 AM      Component Value Range   Glucose-Capillary 125 (*) 70 - 99 (mg/dL)  GLUCOSE, CAPILLARY     Status: Abnormal   Collection Time   02/10/12  4:51 PM      Component Value Range   Glucose-Capillary 115 (*) 70 - 99 (mg/dL)  GLUCOSE, CAPILLARY     Status: Abnormal   Collection Time   02/10/12  8:10 PM      Component Value Range   Glucose-Capillary 147 (*) 70 - 99 (mg/dL)   Comment 1 Documented in Chart     Comment 2 Notify RN    GLUCOSE, CAPILLARY     Status: Abnormal   Collection Time   02/10/12 11:39 PM      Component Value Range   Glucose-Capillary 119 (*) 70 - 99 (mg/dL)   Comment 1 Notify RN     Comment 2 Documented in Chart    GLUCOSE, CAPILLARY     Status: Abnormal   Collection Time   02/11/12  4:31 AM      Component Value Range   Glucose-Capillary 107 (*) 70 - 99 (mg/dL)   Comment 1 Documented in Chart     Comment 2 Notify RN      Intake/Output Summary (Last  24 hours) at 02/11/12 0648 Last data filed at 02/10/12 2015  Gross per 24 hour  Intake    720 ml  Output    125 ml  Net    595 ml    ASSESSMENT AND PLAN:  1) NSTEMI:  EF 40% with septal and apical hypokinesis.  Etiology unclear.  Takotsubo vs. CAD.  Lexiscan Myoview ordered for today.    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  Now therapeutic per pharmacy.   Fayrene Fearing [facility] 02/11/2012 6:48 AM

## 2012-02-11 NOTE — Progress Notes (Signed)
ANTICOAGULATION CONSULT NOTE - Follow Up Consult  Pharmacy Consult for Coumadin Indication: Hx DVT/PE  Assessment: 88 yoF on coumadin PTA for hx DVT/PE.  INR today 2.13 Hgb down to 11.6 from 13 from 11.4, ?if hg of 13 is lab error..  New hematuria reported yesterday. No reports of hematuria today - pt gone to nuc med and hemodialysis today.  Pt s/p nuc med lexiscan myoview this am.   Goal of Therapy:  INR 2.0-3.0   Plan:  1.  Coumadin 2 mg daily 2.  F/u daily PT/INR   Herby Abraham, Pharm.D. [phone] 02/11/2012 2:56 PM   Allergies  Allergen Reactions  . Novocain Other (See Comments)    Unknown    Patient Measurements: Height: 5\' 5"  (165.1 cm) Weight: 129 lb 10.1 oz (58.8 kg) IBW/kg (Calculated) : 57  Heparin Dosing Weight:   Vital Signs: Temp: 98.1 F (36.7 C) (04/10 1406) Temp src: Oral (04/10 1406) BP: 110/92 mmHg (04/10 1430) Pulse Rate: 105  (04/10 1430)  Labs:  Basename 02/11/12 1200 02/10/12 0605 02/09/12 1611 02/09/12 0800 02/09/12 0703  HGB 11.6* 13.0 -- -- --  HCT 36.9 41.3 -- -- 36.1  PLT 389 382 -- -- 407*  APTT -- -- -- -- --  LABPROT 24.2* 23.5* -- 28.5* --  INR 2.13* 2.05* -- 2.63* --  HEPARINUNFRC -- -- -- -- --  CREATININE -- 2.58* 1.82* -- 4.91*  CKTOTAL -- -- -- -- --  CKMB -- -- -- -- --  TROPONINI -- -- -- -- --   Estimated Creatinine Clearance: 18.8 ml/min (by C-G formula based on Cr of 2.58).

## 2012-02-11 NOTE — Progress Notes (Addendum)
Subjective:  No current complaints, no recent nausea or vomiting.  Objective: Vital signs in last 24 hours: Temp:  [97.8 F (36.6 C)-98.4 F (36.9 C)] 97.8 F (36.6 C) (04/10 0657) Pulse Rate:  [105-116] 105  (04/10 0657) Resp:  [16-20] 18  (04/10 0657) BP: (155-180)/(51-75) 168/73 mmHg (04/10 0657) SpO2:  [93 %-99 %] 97 % (04/10 0657) Weight:  [59.5 kg (131 lb 2.8 oz)] 59.5 kg (131 lb 2.8 oz) (04/10 0446) Weight change: -0.3 kg (-10.6 oz)  Intake/Output from previous day: 04/09 0701 - 04/10 0700 In: 720 [P.O.:720] Out: 275 [Urine:275]   EXAM: General appearance:  Alert, in no apparent distress Resp:  CTA bilaterally Cardio:  RRR without murmur GI: + BS, soft and nontender Extremities:  No edema Access:  Right IJ catheter, also AVF @ RUA with + bruit   Lab Results:  [facility] 02/10/12 0605 02/09/12 0703  WBC 10.6* 14.8*  HGB 13.0 11.4*  HCT 41.3 36.1  PLT 382 407*   BMET:  Basename 02/10/12 0605 02/09/12 1611  NA 134* 133*  K 4.7 4.2  CL 93* 92*  CO2 25 28  GLUCOSE 131* 286*  BUN 22 14  CREATININE 2.58* 1.82*  CALCIUM 9.8 9.2  ALBUMIN -- --   No results found for this basename: PTH:2 in the last 72 hours Iron Studies: No results found for this basename: IRON,TIBC,TRANSFERRIN,FERRITIN in the last 72 hours  Assessment/Plan: 1.  Dyspnea - secondary to bronchitis, COPD, and vascular congestion/interstitial edema per chest-ray on 4/7; on Levaquin 750 mg PO on MWF; ? NSTEMI with elevated cardiac enzymes followed by Cardiology, echo on 4/7 with new segmental LV dysfunction.  Lexiscan Myoview today. 2.  ESRD - on HD on MWF, K 4.7 yesterday. HD pending today.  3.  Anemia - Hgb 13 on Aranesp 100 mcg on Wed and weekly IV Fe. Hold Aranesp tomorrow.  4.  HTN/Volume - BP fluctuates (168/73 recently); wt 59.5 kg today (EDW of 59).  UF goal of 2-3 L today. 5.  Secondary hyperparathyroidism - Ca 9.8 (corrected 10.8) last P 3.2, on 2.5Ca bath, Zemplar 1 mcg per HD, Phoslo 1 with  meals. Use 2Ca bath today, Zemplar dc'd.  6.  Nutrition - last Alb 2.7.  7.  Dispo- home with HHN soon per primary.     LOS: 7 days   LYLES,CHARLES 02/11/2012,8:45 AM  Patient seen and examined and agree with assessment and plan as above.  [name]  MD [facility] [phone] pgr    [phone] cell 02/11/2012, 5:33 PM

## 2012-02-12 DIAGNOSIS — R9439 Abnormal result of other cardiovascular function study: Secondary | ICD-10-CM

## 2012-02-12 DIAGNOSIS — I214 Non-ST elevation (NSTEMI) myocardial infarction: Secondary | ICD-10-CM

## 2012-02-12 LAB — GLUCOSE, CAPILLARY
Glucose-Capillary: 205 mg/dL — ABNORMAL HIGH (ref 70–99)
Glucose-Capillary: 215 mg/dL — ABNORMAL HIGH (ref 70–99)
Glucose-Capillary: 105 mg/dL — ABNORMAL HIGH (ref 70–99)
Glucose-Capillary: 256 mg/dL — ABNORMAL HIGH (ref 70–99)

## 2012-02-12 LAB — PROTIME-INR: Prothrombin Time: 23.8 seconds — ABNORMAL HIGH (ref 11.6–15.2)

## 2012-02-12 MED ORDER — DIAZEPAM 2 MG PO TABS: 2.0000 mg | ORAL_TABLET | ORAL | Status: DC

## 2012-02-12 MED ORDER — PHYTONADIONE 5 MG PO TABS
5.0000 mg | ORAL_TABLET | Freq: Once | ORAL | Status: AC
  Administered 2012-02-12: 5 mg via ORAL
  Filled 2012-02-12: qty 1

## 2012-02-12 MED ORDER — ASPIRIN 81 MG PO CHEW
324.0000 mg | CHEWABLE_TABLET | ORAL | Status: AC
  Administered 2012-02-13: 324 mg via ORAL
  Filled 2012-02-12: qty 4

## 2012-02-12 MED ORDER — SODIUM CHLORIDE 0.9 % IJ SOLN: 3.0000 mL | INTRAMUSCULAR | Status: DC | PRN

## 2012-02-12 MED ORDER — SODIUM CHLORIDE 0.9 % IV SOLN: INTRAVENOUS | Status: DC

## 2012-02-12 MED ORDER — ASPIRIN 81 MG PO CHEW
81.0000 mg | CHEWABLE_TABLET | Freq: Every day | ORAL | Status: DC
  Administered 2012-02-14: 81 mg via ORAL
  Filled 2012-02-12: qty 1

## 2012-02-12 MED ORDER — SODIUM CHLORIDE 0.9 % IV SOLN: 250.0000 mL | INTRAVENOUS | Status: DC | PRN

## 2012-02-12 MED ORDER — METOPROLOL TARTRATE 12.5 MG HALF TABLET
12.5000 mg | ORAL_TABLET | Freq: Two times a day (BID) | ORAL | Status: DC
  Administered 2012-02-12 – 2012-02-14 (×4): 12.5 mg via ORAL
  Filled 2012-02-12 (×8): qty 1

## 2012-02-12 MED ORDER — SODIUM CHLORIDE 0.9 % IJ SOLN: 3.0000 mL | Freq: Two times a day (BID) | INTRAMUSCULAR | Status: DC

## 2012-02-12 NOTE — Progress Notes (Signed)
Subjective:  No current complaints, no nausea or vomiting, denies chest pain.  Objective: Vital signs in last 24 hours: Temp:  [97.8 F (36.6 C)-98.2 F (36.8 C)] 98.2 F (36.8 C) (04/11 0928) Pulse Rate:  [87-114] 110  (04/11 0928) Resp:  [14-18] 17  (04/11 0928) BP: (71-160)/(37-92) 120/72 mmHg (04/11 0928) SpO2:  [96 %-100 %] 99 % (04/11 0928) Weight:  [57.2 kg (126 lb 1.7 oz)-58.8 kg (129 lb 10.1 oz)] 57.2 kg (126 lb 1.7 oz) (04/10 2116) Weight change: -0.7 kg (-1 lb 8.7 oz)  Intake/Output from previous day: 04/10 0701 - 04/11 0700 In: 360 [P.O.:360] Out: 1209  Intake/Output this shift: Total I/O In: 240 [P.O.:240] Out: -  EXAM: General appearance:  Alert, in no apparent distress Resp:  CTA bilaterally Cardio:  RRR without murmur GI: + BS, soft and nontender Extremities:  No edema Access:  Right IJ catheter, also AVF @ RUA with + bruit  Lab Results:  Basename 02/11/12 1200 02/10/12 0605  WBC 11.5* 10.6*  HGB 11.6* 13.0  HCT 36.9 41.3  PLT 389 382   BMET:  Basename 02/11/12 1200 02/10/12 0605  NA 130* 134*  K 4.2 4.7  CL 92* 93*  CO2 27 25  GLUCOSE 199* 131*  BUN 41* 22  CREATININE 3.93* 2.58*  CALCIUM 9.0 9.8  ALBUMIN 2.9* --   No results found for this basename: PTH:2 in the last 72 hours Iron Studies: No results found for this basename: IRON,TIBC,TRANSFERRIN,FERRITIN in the last 72 hours  Dialysis Orders: Center: [facility] on MWF.  EDW 59 HD Bath 2.5 ca 2.0 k Time 3.5hrs Heparin standard. Access right IJ cath with right avf upper arm BFR 400 DFR 800  Zemplar 1 mcg IV/HD Epogen 15,600 Units IV/HD Venofer 50mg  wkly Other nepro q hd   Assessment/Plan: 1. Dyspnea - secondary to bronchitis, COPD, and vascular congestion/interstitial edema per chest-ray on 4/7; on Levaquin 750 mg PO on MWF; + NSTEMI with elevated cardiac enzymes followed by Cardiology, echo on 4/7 with new segmental LV dysfunction EF 35%, Myoview + for ischemia inferior wall.  Cardiac cath today  postponed since pt is therapeutic on Coumadin; Vitamin K pending, cath hopefully tomorrow per Cardiology.  2. ESRD - on HD on MWF, K 4.2 yesterday. Next HD tomorrow.  3. Anemia - Hgb 11.6 on Aranesp 100 mcg on Wed and weekly IV Fe. Aranesp held yesterday.  4. HTN/Volume - BP better (120/72 most recently); wt 57.2 kg post-HD yesterday (EDW of 59) after net UF of 1.2 L.  5. Secondary hyperparathyroidism - Ca 9 (corrected 9.9) last P 4.8, s/p 2Ca bath yesterday, Zemplar dc'd, Phoslo 1 with meals. 6. Nutrition - last Alb 2.9.  7. Dispo- home with HHN soon per primary.    LOS: 8 days   LYLES,CHARLES 02/12/2012,9:47 AM  Patient seen and examined and agree with assessment and plan as above.  [name]  MD [facility] [phone] pgr    [phone] cell 02/12/2012, 10:58 AM

## 2012-02-12 NOTE — Progress Notes (Signed)
ANTICOAGULATION CONSULT NOTE - Follow Up Consult  Pharmacy Consult for Coumadin Indication: Hx DVT/PE  Allergies  Allergen Reactions  . Novocain Other (See Comments)    Unknown    Patient Measurements: Height: 5\' 5"  (165.1 cm) Weight: 126 lb 1.7 oz (57.2 kg) (bed scale) IBW/kg (Calculated) : 57   Vital Signs: Temp: 98.2 F (36.8 C) (04/11 0928) Temp src: Oral (04/11 0928) BP: 120/72 mmHg (04/11 0928) Pulse Rate: 110  (04/11 0928)  Labs:  Basename 02/12/12 0545 02/11/12 1200 02/10/12 0605 02/09/12 1611  HGB -- 11.6* 13.0 --  HCT -- 36.9 41.3 --  PLT -- 389 382 --  APTT -- -- -- --  LABPROT 23.8* 24.2* 23.5* --  INR 2.09* 2.13* 2.05* --  HEPARINUNFRC -- -- -- --  CREATININE -- 3.93* 2.58* 1.82*  CKTOTAL -- -- -- --  CKMB -- -- -- --  TROPONINI -- -- -- --   Estimated Creatinine Clearance: 12.3 ml/min (by C-G formula based on Cr of 3.93).   Assessment: 65 yoF on coumadin 2mg  daily  PTA for hx DVT/PE.  INR today 2.09, remains therapeutic.  New hematuria reported 02/10/12. No reports of hematuria since 02/10/12.  Cardiologist notes that [facility] on 4/10 showed apical infarct with inferior wall ischemia and infarct, EF 34%. Plan for cardiac cath tomorrow if INR down.  Coumadin discontinued and Vitamin K 5mg  po x1 ordered by Dr. [place].    Goal of Therapy:  INR 2-3 (when coumadin resumed)   Plan:  1. Coumadin held by cardiologist and Vitamin K 5mg  po given today for possible cardiac cath 4/12. 2.  F/u daily PT/INR 3. Follow up for restart of Coumadin after cardiac cath.  Noah Delaine, RPh 02/12/2012 11:54 AM

## 2012-02-12 NOTE — Progress Notes (Signed)
 SUBJECTIVE:  Denies chest pain   PHYSICAL EXAM Filed Vitals:   02/11/12 1730 02/11/12 1800 02/11/12 2116 02/12/12 0500  BP: 84/56 112/56 129/43 106/38  Pulse: 88 87 101 98  Temp:  98 F (36.7 C) 97.8 F (36.6 C) 97.8 F (36.6 C)  TempSrc:  Oral Oral Oral  Resp:  16 18 18  Height:      Weight:  57.6 kg (126 lb 15.8 oz) 57.2 kg (126 lb 1.7 oz)   SpO2:  99% 100% 100%   General:  No disteress Lungs:  Clear Heart:  RRR, no rub Abdomen:  Positive bowel sounds, no rebound no guarding Extremities:  No edema, femoral pulses palpable  LABS: Lab Results  Component Value Date   CKTOTAL 42 02/07/2012   CKMB 4.5* 02/07/2012   TROPONINI 3.81* 02/07/2012   Results for orders placed during the hospital encounter of 02/04/12 (from the past 24 hour(s))  GLUCOSE, CAPILLARY     Status: Abnormal   Collection Time   02/11/12 11:52 AM      Component Value Range   Glucose-Capillary 177 (*) 70 - 99 (mg/dL)  CBC     Status: Abnormal   Collection Time   02/11/12 12:00 PM      Component Value Range   WBC 11.5 (*) 4.0 - 10.5 (K/uL)   RBC 4.20  3.87 - 5.11 (MIL/uL)   Hemoglobin 11.6 (*) 12.0 - 15.0 (g/dL)   HCT 36.9  36.0 - 46.0 (%)   MCV 87.9  78.0 - 100.0 (fL)   MCH 27.6  26.0 - 34.0 (pg)   MCHC 31.4  30.0 - 36.0 (g/dL)   RDW 19.8 (*) 11.5 - 15.5 (%)   Platelets 389  150 - 400 (K/uL)  RENAL FUNCTION PANEL     Status: Abnormal   Collection Time   02/11/12 12:00 PM      Component Value Range   Sodium 130 (*) 135 - 145 (mEq/L)   Potassium 4.2  3.5 - 5.1 (mEq/L)   Chloride 92 (*) 96 - 112 (mEq/L)   CO2 27  19 - 32 (mEq/L)   Glucose, Bld 199 (*) 70 - 99 (mg/dL)   BUN 41 (*) 6 - 23 (mg/dL)   Creatinine, Ser 3.93 (*) 0.50 - 1.10 (mg/dL)   Calcium 9.0  8.4 - 10.5 (mg/dL)   Phosphorus 4.8 (*) 2.3 - 4.6 (mg/dL)   Albumin 2.9 (*) 3.5 - 5.2 (g/dL)   GFR calc non Af Amer 11 (*) >90 (mL/min)   GFR calc Af Amer 13 (*) >90 (mL/min)  PROTIME-INR     Status: Abnormal   Collection Time   02/11/12 12:00  PM      Component Value Range   Prothrombin Time 24.2 (*) 11.6 - 15.2 (seconds)   INR 2.13 (*) 0.00 - 1.49   GLUCOSE, CAPILLARY     Status: Abnormal   Collection Time   02/11/12  6:55 PM      Component Value Range   Glucose-Capillary 103 (*) 70 - 99 (mg/dL)   Comment 1 Notify RN     Comment 2 Documented in Chart    GLUCOSE, CAPILLARY     Status: Abnormal   Collection Time   02/11/12  9:14 PM      Component Value Range   Glucose-Capillary 198 (*) 70 - 99 (mg/dL)   Comment 1 Notify RN    GLUCOSE, CAPILLARY     Status: Abnormal   Collection Time   02/12/12  3:03   AM      Component Value Range   Glucose-Capillary 205 (*) 70 - 99 (mg/dL)   Comment 1 Documented in Chart     Comment 2 Notify RN    PROTIME-INR     Status: Abnormal   Collection Time   02/12/12  5:45 AM      Component Value Range   Prothrombin Time 23.8 (*) 11.6 - 15.2 (seconds)   INR 2.09 (*) 0.00 - 1.49     Intake/Output Summary (Last 24 hours) at 02/12/12 0745 Last data filed at 02/11/12 2151  Gross per 24 hour  Intake    360 ml  Output   1209 ml  Net   -849 ml    ASSESSMENT AND PLAN:  1) NSTEMI:  EF 40% with septal and apical hypokinesis by Echo.  Lexiscan Myoview shows apical infarct with inferior wall ischemia and infarct. EF 34%. Suggests significant CAD.  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  Now therapeutic per pharmacy.  3) ESRD on hemodialysis  4) hyperlipidemia  Plan: Originally put on for cardiac cath today but will cancel since she is therapeutic on coumadin. Will hold coumadin. Give Vitamin K today. If INR down tomorrow will plan cath. Procedure and risks reviewed with patient. On ASA, nitrates. Add low dose metoprolol.    Richardine Peppers JordanMD,FACC 02/12/2012 7:45 AM   

## 2012-02-12 NOTE — Progress Notes (Signed)
Subjective: NO complains  Objective: Weight change: -0.7 kg (-1 lb 8.7 oz)  Intake/Output Summary (Last 24 hours) at 02/12/12 1011 Last data filed at 02/12/12 0928  Gross per 24 hour  Intake    600 ml  Output   1209 ml  Net   -609 ml    Filed Vitals:   02/12/12 0928  BP: 120/72  Pulse: 110  Temp: 98.2 F (36.8 C)  Resp: 17   On exam  SHe is alert afebrile in mild distress  CVS s1s2 heard tachycardic  Lungs clear  Abdomen soft non tender non distended bowel sounds heard  Extremities: no pedal edema.    Lab Results: Results for orders placed during the hospital encounter of 02/04/12 (from the past 24 hour(s))  GLUCOSE, CAPILLARY     Status: Abnormal   Collection Time   02/11/12 11:52 AM      Component Value Range   Glucose-Capillary 177 (*) 70 - 99 (mg/dL)  CBC     Status: Abnormal   Collection Time   02/11/12 12:00 PM      Component Value Range   WBC 11.5 (*) 4.0 - 10.5 (K/uL)   RBC 4.20  3.87 - 5.11 (MIL/uL)   Hemoglobin 11.6 (*) 12.0 - 15.0 (g/dL)   HCT 16.1  09.6 - 04.5 (%)   MCV 87.9  78.0 - 100.0 (fL)   MCH 27.6  26.0 - 34.0 (pg)   MCHC 31.4  30.0 - 36.0 (g/dL)   RDW 40.9 (*) 81.1 - 15.5 (%)   Platelets 389  150 - 400 (K/uL)  RENAL FUNCTION PANEL     Status: Abnormal   Collection Time   02/11/12 12:00 PM      Component Value Range   Sodium 130 (*) 135 - 145 (mEq/L)   Potassium 4.2  3.5 - 5.1 (mEq/L)   Chloride 92 (*) 96 - 112 (mEq/L)   CO2 27  19 - 32 (mEq/L)   Glucose, Bld 199 (*) 70 - 99 (mg/dL)   BUN 41 (*) 6 - 23 (mg/dL)   Creatinine, Ser 9.14 (*) 0.50 - 1.10 (mg/dL)   Calcium 9.0  8.4 - 78.2 (mg/dL)   Phosphorus 4.8 (*) 2.3 - 4.6 (mg/dL)   Albumin 2.9 (*) 3.5 - 5.2 (g/dL)   GFR calc non Af Amer 11 (*) >90 (mL/min)   GFR calc Af Amer 13 (*) >90 (mL/min)  PROTIME-INR     Status: Abnormal   Collection Time   02/11/12 12:00 PM      Component Value Range   Prothrombin Time 24.2 (*) 11.6 - 15.2 (seconds)   INR 2.13 (*) 0.00 - 1.49   GLUCOSE,  CAPILLARY     Status: Abnormal   Collection Time   02/11/12  6:55 PM      Component Value Range   Glucose-Capillary 103 (*) 70 - 99 (mg/dL)   Comment 1 Notify RN     Comment 2 Documented in Chart    GLUCOSE, CAPILLARY     Status: Abnormal   Collection Time   02/11/12  9:14 PM      Component Value Range   Glucose-Capillary 198 (*) 70 - 99 (mg/dL)   Comment 1 Notify RN    GLUCOSE, CAPILLARY     Status: Abnormal   Collection Time   02/12/12  3:03 AM      Component Value Range   Glucose-Capillary 205 (*) 70 - 99 (mg/dL)   Comment 1 Documented in Chart  Comment 2 Notify RN    PROTIME-INR     Status: Abnormal   Collection Time   02/12/12  5:45 AM      Component Value Range   Prothrombin Time 23.8 (*) 11.6 - 15.2 (seconds)   INR 2.09 (*) 0.00 - 1.49   GLUCOSE, CAPILLARY     Status: Normal   Collection Time   02/12/12  8:02 AM      Component Value Range   Glucose-Capillary 95  70 - 99 (mg/dL)     Micro Results: Recent Results (from the past 240 hour(s))  CULTURE, BLOOD (ROUTINE X 2)     Status: Normal   Collection Time   02/04/12  3:40 PM      Component Value Range Status Comment   Specimen Description BLOOD FINGER LEFT   Final    Special Requests BOTTLES DRAWN AEROBIC ONLY 4CC   Final    Culture  Setup Time [id number]   Final    Culture NO GROWTH 5 DAYS   Final    Report Status 02/10/2012 FINAL   Final   CULTURE, BLOOD (ROUTINE X 2)     Status: Normal   Collection Time   02/04/12  4:00 PM      Component Value Range Status Comment   Specimen Description BLOOD ARM LEFT   Final    Special Requests BOTTLES DRAWN AEROBIC ONLY [facility]   Final    Culture  Setup Time [id number]   Final    Culture NO GROWTH 5 DAYS   Final    Report Status 02/10/2012 FINAL   Final   MRSA PCR SCREENING     Status: Normal   Collection Time   02/06/12  2:25 AM      Component Value Range Status Comment   MRSA by PCR NEGATIVE  NEGATIVE  Final     Studies/Results: Dg Chest 2 View  02/06/2012   *RADIOLOGY REPORT*  Clinical Data: Short of breath, pulmonary edema, former smoking history, diabetes  CHEST - 2 VIEW  Comparison: Portable chest x-ray of 02/06/2012  Findings: There has been some improvement in the edema pattern. Mild pulmonary vascular congestion remains with small pleural effusions.  Cardiomegaly is stable.  The right central venous line is unchanged with the tips in the lower SVC near the expected right atrial junction.  IMPRESSION: Improvement in edema pattern.  Small effusions and mild cardiomegaly remain.  Original Report Authenticated By: Juline Patch, M.D.   Dg Chest 2 View  02/04/2012  *RADIOLOGY REPORT*  Clinical Data: Recent pneumonia  CHEST - 2 VIEW  Comparison: 01/24/2012  Findings: Cardiomediastinal silhouette is stable.  Dual lumen right IJ catheter is unchanged in position.  There is a small right pleural effusion with right basilar atelectasis or infiltrate.  New patchy airspace disease right upper lobe perihilar suspicious for pneumonia. Left lung is clear.  No pulmonary edema.  IMPRESSION:  There is a small right pleural effusion with right basilar atelectasis or infiltrate.  New patchy airspace disease right upper lobe perihilar suspicious for pneumonia.  Original Report Authenticated By: Natasha Mead, M.D.   Dg Chest Port 1 View  02/07/2012  *RADIOLOGY REPORT*  Clinical Data: 71 year old female with pulmonary edema.  Acute-on- chronic respiratory failure.  Bilateral pleural effusions.  PORTABLE CHEST - 1 VIEW  Comparison: 02/06/2012 and earlier.  Findings: Portable semi upright AP view 0708 hours.  Stable right IJ approach dual lumen dialysis type catheter.  Stable cardiac size and mediastinal contours.  Worsening  airspace opacity at both lung bases.  Superimposed veiling opacity compatible with small to moderate effusions.  No pneumothorax.  Vascular congestion is increased.  IMPRESSION: Interval worsening vascular congestion/interstitial edema, small to moderate bilateral  effusions, and bibasilar ventilation.  Original Report Authenticated By: Harley Hallmark, M.D.   Dg Chest Port 1 View  02/06/2012  *RADIOLOGY REPORT*  Clinical Data: Respiratory distress.  PORTABLE CHEST - 1 VIEW  Comparison: 02/04/2012  Findings: Enlarged heart size with prominent pulmonary vascularity. Interval development of increased perihilar air space disease and infiltration or atelectasis in the lung bases.  Small bilateral pleural effusions.  Changes suggest developing congestive failure or fluid overload.  Right central venous catheter remains unchanged in position.  Previously demonstrated right lung airspace disease is obscured by the more confluent process today.  No pneumothorax.  IMPRESSION: Increasing congestive changes in the heart and lungs with bilateral pleural effusions and diffuse pulmonary edema.  Original Report Authenticated By: Marlon Pel, M.D.   Portable Chest Xray In Am  01/24/2012  *RADIOLOGY REPORT*  Clinical Data: Pneumonia  PORTABLE CHEST - 1 VIEW  Comparison: 01/23/2012; 10/23/2011; chest CT - 10/22/2011  Findings: Unchanged cardiac silhouette and mediastinal contours. Stable positioning of support apparatus.  Grossly unchanged right mid and lower lung heterogeneous air space opacities.  Grossly unchanged left basilar/retrocardiac heterogeneous opacities.  The pulmonary vasculature remains indistinct.  Persistent blunting of the right costophrenic angle suggesting a small right-sided pleural effusion. No pneumothorax.  Unchanged bones.  IMPRESSION: 1.  Grossly unchanged right mid and lower lung heterogeneous air space opacity worrisome for infection. 2.  Grossly unchanged left basilar/retrocardiac opacity, possibly atelectasis. 3.  Unchanged findings of mild pulmonary edema and small right- sided effusion.  Original Report Authenticated By: Waynard Reeds, M.D.   Portable Chest Xray  01/23/2012  *RADIOLOGY REPORT*  Clinical Data: Increased shortness of breath   PORTABLE CHEST - 1 VIEW  Comparison: Portable chest x-ray of 01/23/2012  Findings: There has been some improvement in airspace disease at the right lung base and in the right mid lung.  A small right effusion remains.  Mild cardiomegaly is stable.  There is still evidence of mild pulmonary vascular congestion.  Right central venous line is unchanged in position.  IMPRESSION: Some improvement in airspace disease.  Persistent cardiomegaly, right effusion, and mild pulmonary vascular congestion.  Original Report Authenticated By: Juline Patch, M.D.   Medications: Scheduled Meds:    . acidophilus  1 capsule Oral BID WC  . aspirin  81 mg Oral Daily  . calcium acetate  667 mg Oral TID WC  . diazepam  5 mg Oral On Call  . ferric gluconate (FERRLECIT/NULECIT) IV  62.5 mg Intravenous Q Wed-1800  . insulin aspart  0-9 Units Subcutaneous Q4H  . insulin glargine  10 Units Subcutaneous QHS  . isosorbide mononitrate  120 mg Oral Daily  . levofloxacin  750 mg Oral Q M,W,F-2000  . levothyroxine  25 mcg Oral QAC breakfast  . metoprolol tartrate  12.5 mg Oral BID  . multivitamin  1 tablet Oral QHS  . pantoprazole  40 mg Oral Daily  . phytonadione  5 mg Oral Once  . simvastatin  10 mg Oral q1800  . sodium chloride  3 mL Intravenous Q12H  . sodium chloride  3 mL Intravenous Q12H  . Warfarin - Pharmacist Dosing Inpatient   Does not apply q1800  . DISCONTD: ferric gluconate (FERRLECIT/NULECIT) IV  62.5 mg Intravenous Weekly  . DISCONTD:  warfarin  2 mg Oral q1800   Continuous Infusions:  PRN Meds:.sodium chloride, sodium chloride, acetaminophen, acetaminophen, albuterol, calcium carbonate (dosed in mg elemental calcium), camphor-menthol, docusate sodium, feeding supplement (NEPRO CARB STEADY), guaiFENesin-dextromethorphan, heparin, hydrOXYzine, nitroGLYCERIN, ondansetron (ZOFRAN) IV, ondansetron, sodium chloride, sodium chloride, sorbitol, technetium tetrofosmin   Initial Presentation: 70 year old lady  with h/o copd on oxygen at home, who was recently discharged from the hospital 10 days ago after being treated for health care associated pneumonia and UTI, comes back for sob and worsening cough. CXR showed new pneumonia on RUL and RLL. She was admitted and treated again for HCAP. On 4/5, she went into acute respiratory failure secondary to  pulmonary edema. She was seen by PCCM, and was transferred to ICU. She underwent HD, excess fluid abotu 3.7 lit was taken out. She was [facility] back to [facility]. Her cardiac enzymes were elevated on 4/6, cardiology consult was obtained. 2 d echocardiogram was ordered, showed systolic and diastolic dysfunction with wall abnormalities. She is currently on schedule for Myoview scan for tomorrow.   Assessment/Plan: Patient Active Hospital Problem List:  Acute respiratory failure:  -secondary to a combination of pulmonary edema and NSTEMI, copd and bronchitis. On levaquin for bronchitis. Continue with nebs and oxygen.   Leukocytosis:  Resolved.Probably secondary to steroids that were given initally after admission.    NSTEMI: -Echo showing LV systolic dysfunction. Myoview done results pending. Continue with aspirin.  -Myoview showed inferior ischemia, cath in am.  ESRD: On HD further management as per renal.   Nausea and vomiting:  resolved.   DM:  On lantus and SSI. 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   Full code   LOS: 8 days   [name] [name], [name] [name] 02/12/2012, 10:11 AM

## 2012-02-13 ENCOUNTER — Encounter (HOSPITAL_COMMUNITY)
Admission: EM | Disposition: A | Discharge status: Home Health | Payer: Self-pay | Source: Ambulatory Visit | Attending: Internal Medicine

## 2012-02-13 DIAGNOSIS — I2510 Atherosclerotic heart disease of native coronary artery without angina pectoris: Secondary | ICD-10-CM

## 2012-02-13 HISTORY — PX: LEFT HEART CATHETERIZATION WITH CORONARY ANGIOGRAM: SHX5451

## 2012-02-13 LAB — POCT ACTIVATED CLOTTING TIME
Activated Clotting Time: 193 seconds
Activated Clotting Time: 204 seconds
Activated Clotting Time: 188 seconds
Activated Clotting Time: 177 seconds

## 2012-02-13 LAB — CBC
Platelets: 373 10*3/uL (ref 150–400)
RDW: 19.3 % — ABNORMAL HIGH (ref 11.5–15.5)
WBC: 8.6 10*3/uL (ref 4.0–10.5)

## 2012-02-13 LAB — PROTIME-INR
INR: 1.1 (ref 0.00–1.49)
Prothrombin Time: 14.4 seconds (ref 11.6–15.2)

## 2012-02-13 LAB — GLUCOSE, CAPILLARY
Glucose-Capillary: 130 mg/dL — ABNORMAL HIGH (ref 70–99)
Glucose-Capillary: 132 mg/dL — ABNORMAL HIGH (ref 70–99)

## 2012-02-13 LAB — RENAL FUNCTION PANEL
CO2: 25 mEq/L (ref 19–32)
Calcium: 9.3 mg/dL (ref 8.4–10.5)
GFR calc Af Amer: 14 mL/min — ABNORMAL LOW (ref >90)
Glucose, Bld: 113 mg/dL — ABNORMAL HIGH (ref 70–99)
Sodium: 135 mEq/L (ref 135–145)

## 2012-02-13 SURGERY — LEFT HEART CATHETERIZATION WITH CORONARY ANGIOGRAM
Anesthesia: LOCAL

## 2012-02-13 SURGERY — LEFT AND RIGHT HEART CATHETERIZATION WITH CORONARY ANGIOGRAM
Anesthesia: LOCAL

## 2012-02-13 MED ORDER — HEPARIN (PORCINE) IN NACL 2-0.9 UNIT/ML-% IJ SOLN
INTRAMUSCULAR | Status: AC
  Filled 2012-02-13: qty 2000

## 2012-02-13 MED ORDER — NITROGLYCERIN 0.2 MG/ML ON CALL CATH LAB
INTRAVENOUS | Status: AC
  Filled 2012-02-13: qty 1

## 2012-02-13 MED ORDER — ACETAMINOPHEN 325 MG PO TABS: 650.0000 mg | ORAL_TABLET | Freq: Four times a day (QID) | ORAL | Status: DC | PRN

## 2012-02-13 MED ORDER — HYDRALAZINE HCL 20 MG/ML IJ SOLN
10.0000 mg | Freq: Once | INTRAMUSCULAR | Status: AC
  Administered 2012-02-13: 10 mg via INTRAVENOUS
  Filled 2012-02-13: qty 0.5

## 2012-02-13 MED ORDER — LIDOCAINE HCL (PF) 1 % IJ SOLN
INTRAMUSCULAR | Status: AC
  Filled 2012-02-13: qty 30

## 2012-02-13 MED ORDER — FENTANYL CITRATE 0.05 MG/ML IJ SOLN
INTRAMUSCULAR | Status: AC
  Filled 2012-02-13: qty 2

## 2012-02-13 MED ORDER — LEVOFLOXACIN 250 MG PO TABS
250.0000 mg | ORAL_TABLET | ORAL | Status: DC
  Administered 2012-02-13: 250 mg via ORAL
  Filled 2012-02-13: qty 1

## 2012-02-13 MED ORDER — BIVALIRUDIN 250 MG IV SOLR
INTRAVENOUS | Status: AC
  Filled 2012-02-13: qty 250

## 2012-02-13 MED ORDER — DARBEPOETIN ALFA-POLYSORBATE 60 MCG/0.3ML IJ SOLN: 60.0000 ug | INTRAMUSCULAR | Status: DC

## 2012-02-13 MED ORDER — ACETAMINOPHEN 325 MG PO TABS: 650.0000 mg | ORAL_TABLET | ORAL | Status: DC | PRN

## 2012-02-13 MED ORDER — MIDAZOLAM HCL 2 MG/2ML IJ SOLN
INTRAMUSCULAR | Status: AC
  Filled 2012-02-13: qty 2

## 2012-02-13 MED ORDER — HYDROCODONE-ACETAMINOPHEN 5-325 MG PO TABS
1.0000 | ORAL_TABLET | ORAL | Status: DC | PRN
  Administered 2012-02-13: 1 via ORAL
  Filled 2012-02-13: qty 1

## 2012-02-13 MED ORDER — FAMOTIDINE IN NACL 20-0.9 MG/50ML-% IV SOLN
INTRAVENOUS | Status: AC
  Filled 2012-02-13: qty 50

## 2012-02-13 MED ORDER — CLOPIDOGREL BISULFATE 300 MG PO TABS
ORAL_TABLET | ORAL | Status: AC
  Filled 2012-02-13: qty 2

## 2012-02-13 NOTE — Progress Notes (Signed)
Patient received from cath lab with Left wrist peripheral IV infiltrated; IV team attempted new site X two; unable to place.  Patient currently has R femoral v. Line which will be DC' d when ACT goal is reached, leaving pat. Without IV access (except RIJ Diatech).  Dr. David Stall notified.

## 2012-02-13 NOTE — Progress Notes (Signed)
Inpatient Diabetes Program Recommendations  AACE/ADA: New Consensus Statement on Inpatient Glycemic Control (2009)  Target Ranges:  Prepandial:   less than 140 mg/dL      Peak postprandial:   less than 180 mg/dL (1-2 hours)      Critically ill patients:  140 - 180 mg/dL   Reason for Visit: Results for [name], [name] (MRN [id number]) as of 02/13/2012 13:31  Ref. Range 02/12/2012 17:11 02/12/2012 20:24 02/13/2012 00:04 02/13/2012 04:09 02/13/2012 07:23  Glucose-Capillary Latest Range: 70-99 mg/dL 409 (H) 811 (H) 914 (H) 95 106 (H)    Inpatient Diabetes Program Recommendations Insulin - Basal: . Correction (SSI): Change correction schedule to tid AC and HS (instead of q 4 hours) Insulin - Meal Coverage: May need Novolg meal coverage 3 units tid with meals. (hold if patient eats less than 50%)  Note: Will follow.

## 2012-02-13 NOTE — H&P (View-Only) (Signed)
SUBJECTIVE:  Denies chest pain   PHYSICAL EXAM Filed Vitals:   02/11/12 1730 02/11/12 1800 02/11/12 2116 02/12/12 0500  BP: 84/56 112/56 129/43 106/38  Pulse: 88 87 101 98  Temp:  98 F (36.7 C) 97.8 F (36.6 C) 97.8 F (36.6 C)  TempSrc:  Oral Oral Oral  Resp:  16 18 18   Height:      Weight:  57.6 kg (126 lb 15.8 oz) 57.2 kg (126 lb 1.7 oz)   SpO2:  99% 100% 100%   General:  No disteress Lungs:  Clear Heart:  RRR, no rub Abdomen:  Positive bowel sounds, no rebound no guarding Extremities:  No edema, femoral pulses palpable  LABS: Lab Results  Component Value Date   CKTOTAL 42 02/07/2012   CKMB 4.5* 02/07/2012   TROPONINI 3.81* 02/07/2012   Results for orders placed during the hospital encounter of 02/04/12 (from the past 24 hour(s))  GLUCOSE, CAPILLARY     Status: Abnormal   Collection Time   02/11/12 11:52 AM      Component Value Range   Glucose-Capillary 177 (*) 70 - 99 (mg/dL)  CBC     Status: Abnormal   Collection Time   02/11/12 12:00 PM      Component Value Range   WBC 11.5 (*) 4.0 - 10.5 (K/uL)   RBC 4.20  3.87 - 5.11 (MIL/uL)   Hemoglobin 11.6 (*) 12.0 - 15.0 (g/dL)   HCT 96.0  45.4 - 09.8 (%)   MCV 87.9  78.0 - 100.0 (fL)   MCH 27.6  26.0 - 34.0 (pg)   MCHC 31.4  30.0 - 36.0 (g/dL)   RDW 11.9 (*) 14.7 - 15.5 (%)   Platelets 389  150 - 400 (K/uL)  RENAL FUNCTION PANEL     Status: Abnormal   Collection Time   02/11/12 12:00 PM      Component Value Range   Sodium 130 (*) 135 - 145 (mEq/L)   Potassium 4.2  3.5 - 5.1 (mEq/L)   Chloride 92 (*) 96 - 112 (mEq/L)   CO2 27  19 - 32 (mEq/L)   Glucose, Bld 199 (*) 70 - 99 (mg/dL)   BUN 41 (*) 6 - 23 (mg/dL)   Creatinine, Ser 8.29 (*) 0.50 - 1.10 (mg/dL)   Calcium 9.0  8.4 - 56.2 (mg/dL)   Phosphorus 4.8 (*) 2.3 - 4.6 (mg/dL)   Albumin 2.9 (*) 3.5 - 5.2 (g/dL)   GFR calc non Af Amer 11 (*) >90 (mL/min)   GFR calc Af Amer 13 (*) >90 (mL/min)  PROTIME-INR     Status: Abnormal   Collection Time   02/11/12 12:00  PM      Component Value Range   Prothrombin Time 24.2 (*) 11.6 - 15.2 (seconds)   INR 2.13 (*) 0.00 - 1.49   GLUCOSE, CAPILLARY     Status: Abnormal   Collection Time   02/11/12  6:55 PM      Component Value Range   Glucose-Capillary 103 (*) 70 - 99 (mg/dL)   Comment 1 Notify RN     Comment 2 Documented in Chart    GLUCOSE, CAPILLARY     Status: Abnormal   Collection Time   02/11/12  9:14 PM      Component Value Range   Glucose-Capillary 198 (*) 70 - 99 (mg/dL)   Comment 1 Notify RN    GLUCOSE, CAPILLARY     Status: Abnormal   Collection Time   02/12/12  3:03  AM      Component Value Range   Glucose-Capillary 205 (*) 70 - 99 (mg/dL)   Comment 1 Documented in Chart     Comment 2 Notify RN    PROTIME-INR     Status: Abnormal   Collection Time   02/12/12  5:45 AM      Component Value Range   Prothrombin Time 23.8 (*) 11.6 - 15.2 (seconds)   INR 2.09 (*) 0.00 - 1.49     Intake/Output Summary (Last 24 hours) at 02/12/12 0745 Last data filed at 02/11/12 2151  Gross per 24 hour  Intake    360 ml  Output   1209 ml  Net   -849 ml    ASSESSMENT AND PLAN:  1) NSTEMI:  EF 40% with septal and apical hypokinesis by Echo.  Lexiscan Myoview shows apical infarct with inferior wall ischemia and infarct. EF 34%. Suggests significant CAD.  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  Now therapeutic per pharmacy.  3) ESRD on hemodialysis  4) hyperlipidemia  Plan: Originally put on for cardiac cath today but will cancel since she is therapeutic on coumadin. Will hold coumadin. Give Vitamin K today. If INR down tomorrow will plan cath. Procedure and risks reviewed with patient. On ASA, nitrates. Add low dose metoprolol.    Theron Arista [facility] 02/12/2012 7:45 AM

## 2012-02-13 NOTE — CV Procedure (Addendum)
Cardiac Catheterization Operative Report  Nicholl Onstott [id number] 4/12/201311:25 AM Quentin Mulling, MD, MD  Procedure Performed:  1. Left Heart Catheterization 2. Selective Coronary Angiography 3. Left ventricular pressures 4. Balloon angioplasty of the severely diseased, heavily calcified RCA  Operator: Verne Carrow, MD  Indication: Pt admitted with  Pneumonia. Mild elevation troponin. Stress test with inferior wall ischemia. Pt has h/o of CAD and attempted PCI at [facility] 5 years ago that was unsuccessful.                                       Procedure Details: The risks, benefits, complications, treatment options, and expected outcomes were discussed with the patient. The patient and/or family concurred with the proposed plan, giving informed consent. The patient was brought to the cath lab after IV hydration was begun and oral premedication was given. The patient was further sedated with Versed and Fentanyl. The right groin was prepped and draped in the usual manner. Using the modified Seldinger access technique, a 5 French sheath was placed in the left femoral artery. Standard diagnostic catheters were used to perform selective coronary angiography. A pigtail catheter was used to measure LV pressures.  PCI report: The patient was found to have a severely calcified, 99% sub-total occlusion of the mid RCA. There was collateral filling of the distal vessel and PDA. This suggested her severe stenosis was chronic. This case was approached as a CTO case. There was a small marginal branch that arose just before the 99% stenosis. The features of the anatomy made this a complex intervention. After much difficulty, I was able to pass a Whisper wire into the distal vessel beyond the severe stenosis. A 2.0 x 15 mm balloon was used to pre-dilate the lesion x 3. I then tried to pass a stent into the mid to distal vessel but was unable to do so. Unfortunately, wire access was lost on stent  exchange and we were unable to rewire the vessel. A small dissection was created in the mid vessel with balloon inflations. I was not able to find the true lumen with the wire on multiple attempts. Flow was lost into the distal vessel in an antegrade direction. I could not rescue the distal vessel. Furtunately, given the chronicity of the disease, the collateral vessels from the left filled the distal RCA and PDA after our intervention. One additional shot of the left system was taken to confirm collaterals. She had no c/o chest pains. The catheters were removed.   Complex procedure requiring 100 minutes operator time and 35 minute fluoro time.   BP was stable. The patient was taken to the recovery area in stable condition.   Hemodynamic Findings: Central aortic pressure: 177/61 Left ventricular pressure: 177/10/14  Angiographic Findings:  Left main:  Heavily calcified vessel with 20% distal stenosis.   Left Anterior Descending Artery: Large caliber vessel that courses the apex. The proximal vessel is heavily calcified. The proximal vessel has diffuse 40% stenosis. There is a discreet 40% stenosis just before the first diagonal branch. The distal LAD has diffuse 99% stenosis at the apex.   Circumflex Artery: Large caliber vessel with very small first and second marginal branches. The second OM branch is 1.0 mm and has a 90% ostial stenosis (too small for PCI). The third OM has minor plaque disease.   Right Coronary Artery: Large dominant vessel. The proximal vessel has a  tubular 90% stenosis. The mid vessel has a chronic, sub-total occlusion. The distal vessel fills both from antegrade flow and from left to right collaterals.   Left Ventricular Angiogram: No LV gram   Impression: 1. 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  2. Attempted angioplasty of the mid RCA. 3 balloon inflations but unable to restore adequate flow. Unable to place a  stent secondary to vessel calcification and tortuosity.    Recommendations: I would continue medical management. She was loaded with Plavix today in the cath lab but no indication for long term Plavix, especially given the fact that she is on long term anti-coagulation with coumadin. She will follow up with Dr. Dietrich Pates after discharge.

## 2012-02-13 NOTE — Progress Notes (Signed)
ANTICOAGULATION CONSULT NOTE - Follow Up Consult  Pharmacy Consult for Coumadin Indication: Hx DVT/PE  Allergies  Allergen Reactions  . Novocain Other (See Comments)    Unknown    Patient Measurements: Height: 5\' 5"  (165.1 cm) Weight: 126 lb 1.7 oz (57.2 kg) (bed scale) IBW/kg (Calculated) : 57   Vital Signs: Temp: 98.6 F (37 C) (04/12 0848) Temp src: Oral (04/12 0848) BP: 157/70 mmHg (04/12 0848) Pulse Rate: 105  (04/12 0916)  Labs:  Basename 02/13/12 0700 02/12/12 0545 02/11/12 1200  HGB 11.3* -- 11.6*  HCT 36.2 -- 36.9  PLT 373 -- 389  APTT -- -- --  LABPROT 14.4 23.8* 24.2*  INR 1.10 2.09* 2.13*  HEPARINUNFRC -- -- --  CREATININE 3.50* -- 3.93*  CKTOTAL -- -- --  CKMB -- -- --  TROPONINI -- -- --   Estimated Creatinine Clearance: 13.8 ml/min (by C-G formula based on Cr of 3.5).   Assessment: 29 yoF on coumadin 2mg  daily  PTA for hx DVT/PE.  INR today 2.09, remains therapeutic.  New hematuria reported 02/10/12. No reports of hematuria since 02/10/12.  Cardiologist notes that [facility] on 4/10 showed apical infarct with inferior wall ischemia and infarct, EF 34%.   S/P Cath- Spoke with Dr. Sanjuana Kava via telephone and was given ok to restart Coumadin on 02/14/12.   Goal of Therapy:  INR 2-3 (when coumadin resumed)   Plan:  1.Coumadin to restart on 02/14/12 per Dr. Sanjuana Kava. No coumadin tonight.  2.  F/u daily PT/INR  Link Snuffer, PharmD, BCPS Clinical Pharmacist [phone] 02/13/2012 11:54 AM

## 2012-02-13 NOTE — Interval H&P Note (Signed)
History and Physical Interval Note:  02/13/2012 9:16 AM  [name] [name]  has presented today for surgery, with the diagnosis of NSTEMI  The various methods of treatment have been discussed with the patient and family. After consideration of risks, benefits and other options for treatment, the patient has consented to  Procedure(s) (LRB): LEFT HEART CATHETERIZATION WITH CORONARY ANGIOGRAM (N/A) as a surgical intervention .  The patients' history has been reviewed, patient examined, no change in status, stable for surgery.  I have reviewed the patients' chart and labs.  Questions were answered to the patient's satisfaction.     Evamaria Detore

## 2012-02-13 NOTE — Progress Notes (Signed)
Subjective: NO complains  Objective: Weight change:   Intake/Output Summary (Last 24 hours) at 02/13/12 1040 Last data filed at 02/13/12 0849  Gross per 24 hour  Intake    240 ml  Output    600 ml  Net   -360 ml    Filed Vitals:   02/13/12 0848  BP: 157/70  Pulse: 98  Temp: 98.6 F (37 C)  Resp: 19   On exam  SHe is alert afebrile in mild distress  CVS s1s2 heard tachycardic  Lungs clear  Abdomen soft non tender non distended bowel sounds heard  Extremities: no pedal edema.    Lab Results: Results for orders placed during the hospital encounter of 02/04/12 (from the past 24 hour(s))  GLUCOSE, CAPILLARY     Status: Abnormal   Collection Time   02/12/12 12:11 PM      Component Value Range   Glucose-Capillary 215 (*) 70 - 99 (mg/dL)  GLUCOSE, CAPILLARY     Status: Abnormal   Collection Time   02/12/12  5:11 PM      Component Value Range   Glucose-Capillary 105 (*) 70 - 99 (mg/dL)  GLUCOSE, CAPILLARY     Status: Abnormal   Collection Time   02/12/12  8:24 PM      Component Value Range   Glucose-Capillary 256 (*) 70 - 99 (mg/dL)  GLUCOSE, CAPILLARY     Status: Abnormal   Collection Time   02/13/12 12:04 AM      Component Value Range   Glucose-Capillary 132 (*) 70 - 99 (mg/dL)   Comment 1 Documented in Chart     Comment 2 Notify RN    GLUCOSE, CAPILLARY     Status: Normal   Collection Time   02/13/12  4:09 AM      Component Value Range   Glucose-Capillary 95  70 - 99 (mg/dL)   Comment 1 Documented in Chart     Comment 2 Notify RN    RENAL FUNCTION PANEL     Status: Abnormal   Collection Time   02/13/12  7:00 AM      Component Value Range   Sodium 135  135 - 145 (mEq/L)   Potassium 4.2  3.5 - 5.1 (mEq/L)   Chloride 93 (*) 96 - 112 (mEq/L)   CO2 25  19 - 32 (mEq/L)   Glucose, Bld 113 (*) 70 - 99 (mg/dL)   BUN 36 (*) 6 - 23 (mg/dL)   Creatinine, Ser 3.08 (*) 0.50 - 1.10 (mg/dL)   Calcium 9.3  8.4 - 65.7 (mg/dL)   Phosphorus 4.7 (*) 2.3 - 4.6 (mg/dL)   Albumin 3.1 (*) 3.5 - 5.2 (g/dL)   GFR calc non Af Amer 12 (*) >90 (mL/min)   GFR calc Af Amer 14 (*) >90 (mL/min)  CBC     Status: Abnormal   Collection Time   02/13/12  7:00 AM      Component Value Range   WBC 8.6  4.0 - 10.5 (K/uL)   RBC 4.08  3.87 - 5.11 (MIL/uL)   Hemoglobin 11.3 (*) 12.0 - 15.0 (g/dL)   HCT 84.6  96.2 - 95.2 (%)   MCV 88.7  78.0 - 100.0 (fL)   MCH 27.7  26.0 - 34.0 (pg)   MCHC 31.2  30.0 - 36.0 (g/dL)   RDW 84.1 (*) 32.4 - 15.5 (%)   Platelets 373  150 - 400 (K/uL)  PROTIME-INR     Status: Normal   Collection Time  02/13/12  7:00 AM      Component Value Range   Prothrombin Time 14.4  11.6 - 15.2 (seconds)   INR 1.10  0.00 - 1.49   GLUCOSE, CAPILLARY     Status: Abnormal   Collection Time   02/13/12  7:23 AM      Component Value Range   Glucose-Capillary 106 (*) 70 - 99 (mg/dL)     Micro Results: Recent Results (from the past 240 hour(s))  CULTURE, BLOOD (ROUTINE X 2)     Status: Normal   Collection Time   02/04/12  3:40 PM      Component Value Range Status Comment   Specimen Description BLOOD FINGER LEFT   Final    Special Requests BOTTLES DRAWN AEROBIC ONLY 4CC   Final    Culture  Setup Time [id number]   Final    Culture NO GROWTH 5 DAYS   Final    Report Status 02/10/2012 FINAL   Final   CULTURE, BLOOD (ROUTINE X 2)     Status: Normal   Collection Time   02/04/12  4:00 PM      Component Value Range Status Comment   Specimen Description BLOOD ARM LEFT   Final    Special Requests BOTTLES DRAWN AEROBIC ONLY [facility]   Final    Culture  Setup Time [id number]   Final    Culture NO GROWTH 5 DAYS   Final    Report Status 02/10/2012 FINAL   Final   MRSA PCR SCREENING     Status: Normal   Collection Time   02/06/12  2:25 AM      Component Value Range Status Comment   MRSA by PCR NEGATIVE  NEGATIVE  Final     Studies/Results: Dg Chest 2 View  02/06/2012  *RADIOLOGY REPORT*  Clinical Data: Short of breath, pulmonary edema, former smoking history,  diabetes  CHEST - 2 VIEW  Comparison: Portable chest x-ray of 02/06/2012  Findings: There has been some improvement in the edema pattern. Mild pulmonary vascular congestion remains with small pleural effusions.  Cardiomegaly is stable.  The right central venous line is unchanged with the tips in the lower SVC near the expected right atrial junction.  IMPRESSION: Improvement in edema pattern.  Small effusions and mild cardiomegaly remain.  Original Report Authenticated By: Juline Patch, M.D.   Dg Chest 2 View  02/04/2012  *RADIOLOGY REPORT*  Clinical Data: Recent pneumonia  CHEST - 2 VIEW  Comparison: 01/24/2012  Findings: Cardiomediastinal silhouette is stable.  Dual lumen right IJ catheter is unchanged in position.  There is a small right pleural effusion with right basilar atelectasis or infiltrate.  New patchy airspace disease right upper lobe perihilar suspicious for pneumonia. Left lung is clear.  No pulmonary edema.  IMPRESSION:  There is a small right pleural effusion with right basilar atelectasis or infiltrate.  New patchy airspace disease right upper lobe perihilar suspicious for pneumonia.  Original Report Authenticated By: Natasha Mead, M.D.   Dg Chest Port 1 View  02/07/2012  *RADIOLOGY REPORT*  Clinical Data: 71 year old female with pulmonary edema.  Acute-on- chronic respiratory failure.  Bilateral pleural effusions.  PORTABLE CHEST - 1 VIEW  Comparison: 02/06/2012 and earlier.  Findings: Portable semi upright AP view 0708 hours.  Stable right IJ approach dual lumen dialysis type catheter.  Stable cardiac size and mediastinal contours.  Worsening airspace opacity at both lung bases.  Superimposed veiling opacity compatible with small to moderate effusions.  No pneumothorax.  Vascular congestion is increased.  IMPRESSION: Interval worsening vascular congestion/interstitial edema, small to moderate bilateral effusions, and bibasilar ventilation.  Original Report Authenticated By: Harley Hallmark, M.D.     Dg Chest Port 1 View  02/06/2012  *RADIOLOGY REPORT*  Clinical Data: Respiratory distress.  PORTABLE CHEST - 1 VIEW  Comparison: 02/04/2012  Findings: Enlarged heart size with prominent pulmonary vascularity. Interval development of increased perihilar air space disease and infiltration or atelectasis in the lung bases.  Small bilateral pleural effusions.  Changes suggest developing congestive failure or fluid overload.  Right central venous catheter remains unchanged in position.  Previously demonstrated right lung airspace disease is obscured by the more confluent process today.  No pneumothorax.  IMPRESSION: Increasing congestive changes in the heart and lungs with bilateral pleural effusions and diffuse pulmonary edema.  Original Report Authenticated By: Marlon Pel, M.D.   Portable Chest Xray In Am  01/24/2012  *RADIOLOGY REPORT*  Clinical Data: Pneumonia  PORTABLE CHEST - 1 VIEW  Comparison: 01/23/2012; 10/23/2011; chest CT - 10/22/2011  Findings: Unchanged cardiac silhouette and mediastinal contours. Stable positioning of support apparatus.  Grossly unchanged right mid and lower lung heterogeneous air space opacities.  Grossly unchanged left basilar/retrocardiac heterogeneous opacities.  The pulmonary vasculature remains indistinct.  Persistent blunting of the right costophrenic angle suggesting a small right-sided pleural effusion. No pneumothorax.  Unchanged bones.  IMPRESSION: 1.  Grossly unchanged right mid and lower lung heterogeneous air space opacity worrisome for infection. 2.  Grossly unchanged left basilar/retrocardiac opacity, possibly atelectasis. 3.  Unchanged findings of mild pulmonary edema and small right- sided effusion.  Original Report Authenticated By: Waynard Reeds, M.D.   Portable Chest Xray  01/23/2012  *RADIOLOGY REPORT*  Clinical Data: Increased shortness of breath  PORTABLE CHEST - 1 VIEW  Comparison: Portable chest x-ray of 01/23/2012  Findings: There has been  some improvement in airspace disease at the right lung base and in the right mid lung.  A small right effusion remains.  Mild cardiomegaly is stable.  There is still evidence of mild pulmonary vascular congestion.  Right central venous line is unchanged in position.  IMPRESSION: Some improvement in airspace disease.  Persistent cardiomegaly, right effusion, and mild pulmonary vascular congestion.  Original Report Authenticated By: Juline Patch, M.D.   Medications: Scheduled Meds:    . acidophilus  1 capsule Oral BID WC  . aspirin  324 mg Oral Pre-Cath  . aspirin  81 mg Oral Daily  . bivalirudin      . calcium acetate  667 mg Oral TID WC  . clopidogrel      . diazepam  2 mg Oral On Call  . diazepam  2 mg Oral On Call  . diazepam  5 mg Oral On Call  . famotidine      . fentaNYL      . ferric gluconate (FERRLECIT/NULECIT) IV  62.5 mg Intravenous Q Wed-1800  . heparin      . insulin aspart  0-9 Units Subcutaneous Q4H  . insulin glargine  10 Units Subcutaneous QHS  . isosorbide mononitrate  120 mg Oral Daily  . levofloxacin  250 mg Oral Q48H  . levothyroxine  25 mcg Oral QAC breakfast  . lidocaine      . lidocaine      . metoprolol tartrate  12.5 mg Oral BID  . midazolam      . multivitamin  1 tablet Oral QHS  . nitroGLYCERIN      .  pantoprazole  40 mg Oral Daily  . simvastatin  10 mg Oral q1800  . sodium chloride  3 mL Intravenous Q12H  . sodium chloride  3 mL Intravenous Q12H  . sodium chloride  3 mL Intravenous Q12H  . Warfarin - Pharmacist Dosing Inpatient   Does not apply q1800  . DISCONTD: aspirin  81 mg Oral Daily  . DISCONTD: levofloxacin  750 mg Oral Q M,W,F-2000   Continuous Infusions:    . sodium chloride     PRN Meds:.sodium chloride, sodium chloride, sodium chloride, acetaminophen, acetaminophen, albuterol, calcium carbonate (dosed in mg elemental calcium), camphor-menthol, docusate sodium, feeding supplement (NEPRO CARB STEADY), guaiFENesin-dextromethorphan,  heparin, hydrOXYzine, nitroGLYCERIN, ondansetron (ZOFRAN) IV, ondansetron, sodium chloride, sodium chloride, sodium chloride, sorbitol   Initial Presentation: 71 year old lady with h/o copd on oxygen at home, who was recently discharged from the hospital 10 days ago after being treated for health care associated pneumonia and UTI, comes back for sob and worsening cough. CXR showed new pneumonia on RUL and RLL. She was admitted and treated again for HCAP. On 4/5, she went into acute respiratory failure secondary to  pulmonary edema. She was seen by PCCM, and was transferred to ICU. She underwent HD, excess fluid abotu 3.7 lit was taken out. She was transferred back to [facility]. Her cardiac enzymes were elevated on 4/6, cardiology consult was obtained. 2 d echocardiogram was ordered, showed systolic and diastolic dysfunction with wall abnormalities. She is currently on schedule for Myoview scan for tomorrow.   Assessment/Plan: Patient Active Hospital Problem List:  Acute respiratory failure:  -secondary to a combination of pulmonary edema and NSTEMI, copd and bronchitis. On levaquin for bronchitis. Continue with nebs and oxygen.   Leukocytosis:  Resolved.Probably secondary to steroids that were given initally after admission.    NSTEMI: -Echo showing LV systolic dysfunction. Myoview done results showed ischemia.  Awaiting cath..  ESRD: On HD further management as per renal.   Nausea and vomiting:  resolved.   DM:  On lantus and SSI. Improving continue current regmen  DVT/PE: Restart coumadin.  Full code   LOS: 9 days   [name] [name], Aashka Salomone 02/13/2012, 10:40 AM

## 2012-02-13 NOTE — Progress Notes (Signed)
Subjective:  Sore back after cardiac cath , no sob , no chest pain Objective Vital signs in last 24 hours: Filed Vitals:   02/12/12 2100 02/13/12 0450 02/13/12 0848 02/13/12 0916  BP: 114/75 153/72 157/70   Pulse: 99 90 98 105  Temp: 98.2 F (36.8 C) 98.6 F (37 C) 98.6 F (37 C)   TempSrc: Oral Oral Oral   Resp: 18 18 19    Height:      Weight:      SpO2: 97% 99% 100%    Weight change:   Intake/Output Summary (Last 24 hours) at 02/13/12 1535 Last data filed at 02/13/12 0849  Gross per 24 hour  Intake    240 ml  Output    600 ml  Net   -360 ml   Labs: Basic Metabolic Panel:  Lab 02/13/12 1610 02/11/12 1200 02/10/12 0605 02/07/12 0930  NA 135 130* 134* --  K 4.2 4.2 4.7 --  CL 93* 92* 93* --  CO2 25 27 25  --  GLUCOSE 113* 199* 131* --  BUN 36* 41* 22 --  CREATININE 3.50* 3.93* 2.58* --  CALCIUM 9.3 9.0 9.8 --  ALB -- -- -- --  PHOS 4.7* 4.8* -- 3.2   Liver Function Tests:  Lab 02/13/12 0700 02/11/12 1200 02/07/12 0930  AST -- -- --  ALT -- -- --  ALKPHOS -- -- --  BILITOT -- -- --  PROT -- -- --  ALBUMIN 3.1* 2.9* 2.7*   No results found for this basename: LIPASE:3,AMYLASE:3 in the last 168 hours No results found for this basename: AMMONIA:3 in the last 168 hours CBC:  Lab 02/13/12 0700 02/11/12 1200 02/10/12 0605 02/09/12 0703 02/08/12 1314  WBC 8.6 11.5* 10.6* -- --  NEUTROABS -- -- -- -- --  HGB 11.3* 11.6* 13.0 -- --  HCT 36.2 36.9 41.3 -- --  MCV 88.7 87.9 88.2 87.6 88.0  PLT 373 389 382 -- --   Cardiac Enzymes:  Lab 02/07/12 0930 02/06/12 2220  CKTOTAL 42 26  CKMB 4.5* 2.7  CKMBINDEX -- --  TROPONINI 3.81* 1.39*   CBG:  Lab 02/13/12 0723 02/13/12 0409 02/13/12 0004 02/12/12 2024 02/12/12 1711  GLUCAP 106* 95 132* 256* 105*    Iron Studies: No results found for this basename: IRON,TIBC,TRANSFERRIN,FERRITIN in the last 72 hours Studies/Results: No results found. Medications:    . DISCONTD: sodium chloride        . acidophilus  1  capsule Oral BID WC  . aspirin  324 mg Oral Pre-Cath  . aspirin  81 mg Oral Daily  . bivalirudin      . calcium acetate  667 mg Oral TID WC  . clopidogrel      . darbepoetin (ARANESP) injection - DIALYSIS  60 mcg Intravenous Q Fri-HD  . famotidine      . fentaNYL      . ferric gluconate (FERRLECIT/NULECIT) IV  62.5 mg Intravenous Q Wed-1800  . heparin      . hydrALAZINE  10 mg Intravenous Once  . insulin aspart  0-9 Units Subcutaneous Q4H  . insulin glargine  10 Units Subcutaneous QHS  . isosorbide mononitrate  120 mg Oral Daily  . levofloxacin  250 mg Oral Q48H  . levothyroxine  25 mcg Oral QAC breakfast  . lidocaine      . lidocaine      . metoprolol tartrate  12.5 mg Oral BID  . midazolam      . multivitamin  1 tablet Oral QHS  . nitroGLYCERIN      . pantoprazole  40 mg Oral Daily  . simvastatin  10 mg Oral q1800  . sodium chloride  3 mL Intravenous Q12H  . Warfarin - Pharmacist Dosing Inpatient   Does not apply q1800  . DISCONTD: diazepam  2 mg Oral On Call  . DISCONTD: diazepam  2 mg Oral On Call  . DISCONTD: diazepam  5 mg Oral On Call  . DISCONTD: levofloxacin  750 mg Oral Q M,W,F-2000  . DISCONTD: sodium chloride  3 mL Intravenous Q12H  . DISCONTD: sodium chloride  3 mL Intravenous Q12H   I  have reviewed scheduled and prn medications.  Physical Exam: General:  Alert , nad Heart: RRR, No murmur Lungs:  CTA Bilaterally Abdomen:  Soft ,nontender Extremities: Dialysis Access: no pedal edema, pos. Bruit RUA AVF, right perm cath   Problem/Plan 1. 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 Clifton James recommends medical therapy 2. Dyspnea- resolved, was secondary to COPD Exacerbation and Pulmonary Edema= on Levaquin  Nebs , O2, and hd will have lower edw. Pulm edema resolved.  3. ESRD - MWF ([facility]) Hd today old edw 59, wed post wt 57 and stable bp 4. Anemia - hgb 11.3. Continue Aranesp  with CAD, weekly iron on hd 5. Secondary hyperparathyroidism - ca=9.7, phos 4.3, [facility] dc with elvated ca, on  phoslo  1 with meals 6. HTN/volume - lower edw,, Metoprolo12.5 mg bid/ Isordil 120mg  q day 7. 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 IDDM = per admit Lenny Pastel, PA-C [facility] [phone] 02/13/2012,3:35 PM  LOS: 9 days   Patient seen and examined and agree with assessment and plan as above.  [name]  MD [facility] [phone] pgr    [phone] cell 02/13/2012, 6:06 PM

## 2012-02-14 ENCOUNTER — Inpatient Hospital Stay (HOSPITAL_COMMUNITY): Payer: Medicare Other

## 2012-02-14 DIAGNOSIS — I2510 Atherosclerotic heart disease of native coronary artery without angina pectoris: Secondary | ICD-10-CM | POA: Insufficient documentation

## 2012-02-14 DIAGNOSIS — I5042 Chronic combined systolic (congestive) and diastolic (congestive) heart failure: Secondary | ICD-10-CM | POA: Insufficient documentation

## 2012-02-14 DIAGNOSIS — I519 Heart disease, unspecified: Secondary | ICD-10-CM

## 2012-02-14 LAB — RENAL FUNCTION PANEL
Albumin: 2.9 g/dL — ABNORMAL LOW (ref 3.5–5.2)
BUN: 43 mg/dL — ABNORMAL HIGH (ref 6–23)
Chloride: 94 mEq/L — ABNORMAL LOW (ref 96–112)
Creatinine, Ser: 4.33 mg/dL — ABNORMAL HIGH (ref 0.50–1.10)
GFR calc non Af Amer: 10 mL/min — ABNORMAL LOW (ref >90)
Phosphorus: 5 mg/dL — ABNORMAL HIGH (ref 2.3–4.6)
Potassium: 4.4 mEq/L (ref 3.5–5.1)

## 2012-02-14 LAB — CBC
HCT: 32 % — ABNORMAL LOW (ref 36.0–46.0)
MCHC: 31.6 g/dL (ref 30.0–36.0)
Platelets: 420 10*3/uL — ABNORMAL HIGH (ref 150–400)
RDW: 19.6 % — ABNORMAL HIGH (ref 11.5–15.5)
WBC: 10.8 10*3/uL — ABNORMAL HIGH (ref 4.0–10.5)

## 2012-02-14 LAB — GLUCOSE, CAPILLARY
Glucose-Capillary: 131 mg/dL — ABNORMAL HIGH (ref 70–99)
Glucose-Capillary: 238 mg/dL — ABNORMAL HIGH (ref 70–99)

## 2012-02-14 MED ORDER — SODIUM CHLORIDE 0.9 % IV SOLN: 62.5000 mg | INTRAVENOUS | Status: DC

## 2012-02-14 MED ORDER — METOPROLOL TARTRATE 12.5 MG HALF TABLET: 12.5000 mg | ORAL_TABLET | Freq: Two times a day (BID) | ORAL | Status: DC

## 2012-02-14 MED ORDER — METOPROLOL TARTRATE 25 MG PO TABS
25.0000 mg | ORAL_TABLET | Freq: Two times a day (BID) | ORAL | Status: DC
  Administered 2012-02-14: 25 mg via ORAL
  Filled 2012-02-14 (×2): qty 1

## 2012-02-14 MED ORDER — WARFARIN SODIUM 3 MG PO TABS
3.0000 mg | ORAL_TABLET | Freq: Once | ORAL | Status: DC
  Filled 2012-02-14: qty 1

## 2012-02-14 MED ORDER — SIMVASTATIN 10 MG PO TABS: 20.0000 mg | ORAL_TABLET | Freq: Every day | ORAL | Status: DC

## 2012-02-14 MED ORDER — WARFARIN SODIUM 2 MG PO TABS: 3.0000 mg | ORAL_TABLET | Freq: Every day | ORAL | Status: DC

## 2012-02-14 MED ORDER — DARBEPOETIN ALFA-POLYSORBATE 60 MCG/0.3ML IJ SOLN: 60.0000 ug | INTRAMUSCULAR | Status: DC

## 2012-02-14 MED ORDER — METOPROLOL TARTRATE 25 MG PO TABS: 25.0000 mg | ORAL_TABLET | Freq: Two times a day (BID) | ORAL | Status: DC

## 2012-02-14 NOTE — Progress Notes (Signed)
Site area : Left Groin  Site Prior to Removal :  Arterial :  Level  0              Venous :  Level 0  Pressure applied for :                   20 min                                  35 min  Minutes beginning at :                   20:40                                    21:00      Manual :                                         Yes                                       Yes  Pt status during pull  :                                  AAO x3  Post pull groin site :                   Level 0                                      Level 0  Post pull instructions given :   Yes  Post pull instructions given : Yes  Dressing applied : Yes                   Comments : Pt tolerated procedure well

## 2012-02-14 NOTE — Progress Notes (Signed)
Subjective:  On hd ; hd today secondary to'' late card. Cath sheath pull last night.''no sob, no chest pain,back pain better, Objective Vital signs in last 24 hours: Filed Vitals:   02/14/12 0700 02/14/12 0730 02/14/12 0735 02/14/12 0800  BP: 140/61 83/41 93/44  94/48  Pulse: 98 88 93 94  Temp:      TempSrc:      Resp: 16 16 16 16   Height:      Weight:      SpO2:       Weight change:   Intake/Output Summary (Last 24 hours) at 02/14/12 0826 Last data filed at 02/14/12 0600  Gross per 24 hour  Intake    830 ml  Output    625 ml  Net    205 ml   Labs: Basic Metabolic Panel:  Lab 02/14/12 1610 02/13/12 0700 02/11/12 1200  NA 132* 135 130*  K 4.4 4.2 4.2  CL 94* 93* 92*  CO2 25 25 27   GLUCOSE 98 113* 199*  BUN 43* 36* 41*  CREATININE 4.33* 3.50* 3.93*  CALCIUM 9.1 9.3 9.0  ALB -- -- --  PHOS 5.0* 4.7* 4.8*   Liver Function Tests:  Lab 02/14/12 0654 02/13/12 0700 02/11/12 1200  AST -- -- --  ALT -- -- --  ALKPHOS -- -- --  BILITOT -- -- --  PROT -- -- --  ALBUMIN 2.9* 3.1* 2.9*   No results found for this basename: LIPASE:3,AMYLASE:3 in the last 168 hours No results found for this basename: AMMONIA:3 in the last 168 hours CBC:  Lab 02/14/12 0653 02/13/12 0700 02/11/12 1200 02/10/12 0605 02/09/12 0703  WBC 10.8* 8.6 11.5* -- --  NEUTROABS -- -- -- -- --  HGB 10.1* 11.3* 11.6* -- --  HCT 32.0* 36.2 36.9 -- --  MCV 86.7 88.7 87.9 88.2 87.6  PLT 420* 373 389 -- --   Cardiac Enzymes:  Lab 02/07/12 0930  CKTOTAL 42  CKMB 4.5*  CKMBINDEX --  TROPONINI 3.81*   CBG:  Lab 02/14/12 0415 02/13/12 2338 02/13/12 2032 02/13/12 1837 02/13/12 0723  GLUCAP 131* 133* 74 130* 106*    Iron Studies: No results found for this basename: IRON,TIBC,TRANSFERRIN,FERRITIN in the last 72 hours Studies/Results: No results found. Medications:    . DISCONTD: sodium chloride        . acidophilus  1 capsule Oral BID WC  . aspirin  81 mg Oral Daily  . bivalirudin      .  calcium acetate  667 mg Oral TID WC  . clopidogrel      . darbepoetin (ARANESP) injection - DIALYSIS  60 mcg Intravenous Q Fri-HD  . famotidine      . fentaNYL      . ferric gluconate (FERRLECIT/NULECIT) IV  62.5 mg Intravenous Q Wed-1800  . heparin      . hydrALAZINE  10 mg Intravenous Once  . insulin aspart  0-9 Units Subcutaneous Q4H  . insulin glargine  10 Units Subcutaneous QHS  . isosorbide mononitrate  120 mg Oral Daily  . levofloxacin  250 mg Oral Q48H  . levothyroxine  25 mcg Oral QAC breakfast  . lidocaine      . lidocaine      . metoprolol tartrate  25 mg Oral BID  . midazolam      . multivitamin  1 tablet Oral QHS  . nitroGLYCERIN      . pantoprazole  40 mg Oral Daily  . simvastatin  10 mg Oral  [id number]  . sodium chloride  3 mL Intravenous Q12H  . Warfarin - Pharmacist Dosing Inpatient   Does not apply q1800  . DISCONTD: diazepam  2 mg Oral On Call  . DISCONTD: diazepam  2 mg Oral On Call  . DISCONTD: diazepam  5 mg Oral On Call  . DISCONTD: levofloxacin  750 mg Oral Q M,W,F-2000  . DISCONTD: metoprolol tartrate  12.5 mg Oral BID  . DISCONTD: sodium chloride  3 mL Intravenous Q12H  . DISCONTD: sodium chloride  3 mL Intravenous Q12H   I  have reviewed scheduled and prn medications.  Physical Exam: General: Alert on hd nad     Heart: RRR, No murmur  Lungs: CTA Bilaterally  Abdomen: Soft ,nontender  Extremities: Dialysis Access: no pedal edema, , Right perm cath  Patent on HD; RUA AVF  Positive bruit  Bruising resolving,no swelling.   Problem/Plan:  1. 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 Clifton James recommends medical Rx, EF 40 % 2. Dyspnea- resolved, was secondary to COPD Exacerbation and Pulmonary Edema= on Levaquin Nebs , O2, and hd will have lower edw. Pulm edema resolved.  3. ESRD - MWF ([facility]) Hd today old edw 56, new edw 57kg , 4. Anemia - hgb  10.1 yesterday 11.3.  Continue Aranesp with CAD, weekly iron on hd 5. Secondary hyperparathyroidism - ca=9.1, phos 5.0, Z[facility] dc with elvated ca, on phoslo 1 with meals, fu  Outpt.labs restart [facility] 6. HTN/volume - lower edw,, Metoprolo12.5 mg bid/ Isordil 120mg  q day 7. H/O PE 10/2011 on coumadin/ outpt. inr 8. IDDM = per admit  [name] Pastel, PA-C [facility] [phone] 02/14/2012,8:26 AM  LOS: 10 days   Patient seen and examined and agree with assessment and plan as above.  [name]  MD [facility] [phone] pgr    [phone] cell 02/14/2012, 3:46 PM

## 2012-02-14 NOTE — Progress Notes (Signed)
ANTICOAGULATION CONSULT NOTE - Follow Up Consult  Pharmacy Consult for Coumadin Indication: Hx DVT/PE  Allergies  Allergen Reactions  . Novocain Other (See Comments)    Unknown    Patient Measurements: Height: 5\' 5"  (165.1 cm) Weight: 130 lb 8.2 oz (59.2 kg) IBW/kg (Calculated) : 57   Vital Signs: Temp: 96.9 F (36.1 C) (04/13 0630) Temp src: Oral (04/13 0630) BP: 94/48 mmHg (04/13 0800) Pulse Rate: 94  (04/13 0800)  Labs:  Basename 02/14/12 0654 02/14/12 0653 02/13/12 0700 02/12/12 0545 02/11/12 1200  HGB -- 10.1* 11.3* -- --  HCT -- 32.0* 36.2 -- 36.9  PLT -- 420* 373 -- 389  APTT -- -- -- -- --  LABPROT -- -- 14.4 23.8* 24.2*  INR -- -- 1.10 2.09* 2.13*  HEPARINUNFRC -- -- -- -- --  CREATININE 4.33* -- 3.50* -- 3.93*  CKTOTAL -- -- -- -- --  CKMB -- -- -- -- --  TROPONINI -- -- -- -- --   Estimated Creatinine Clearance: 11.2 ml/min (by C-G formula based on Cr of 4.33).   Assessment: 35 yoF on coumadin 2mg  daily  PTA for hx DVT/PE.  INR today 2.09, remains therapeutic.  New hematuria reported 02/10/12. No reports of hematuria since 02/10/12.  Cardiologist notes that 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   S/P Cath- To restart warfarin today 4/13, hgb/hct stable, no signs of bleeding today. INR 1.1 4/12, last dose 4/9. Home dose 2 mg daily.  Goal of Therapy:  INR 2-3 (when coumadin resumed)   Plan:  1. Warfarin 3 mg today 2. F/u daily PT/INR  [place] R. Haani Bakula, PharmD Clinical Pharmacist [phone] 02/14/2012 8:49 AM

## 2012-02-14 NOTE — Discharge Summary (Signed)
Admit date: 02/04/2012 Discharge date: 02/14/2012  Primary Care Physician:  Quentin Mulling, MD, MD   Discharge Diagnoses:   Active Hospital Problems  Diagnoses Date Noted   . ESRD (end stage renal disease) on dialysis 01/23/2012   . DM (diabetes mellitus) 10/11/2011   . Anemia 10/11/2011     Resolved Hospital Problems  Diagnoses Date Noted Date Resolved  . NSTEMI (non-ST elevated myocardial infarction) 02/12/2012 02/14/2012  . Acute pulmonary edema 02/06/2012 02/14/2012  . Acute-on-chronic respiratory failure 01/23/2012 02/14/2012  . HCAP (healthcare-associated pneumonia) 01/23/2012 02/14/2012  . Pleural effusion, bilateral 10/11/2011 02/14/2012  . COPD with acute exacerbation 10/11/2011 02/14/2012     DISCHARGE MEDICATION: Medication List  As of 02/14/2012  8:35 AM   STOP taking these medications         ranitidine 150 MG tablet         TAKE these medications         albuterol (5 MG/ML) 0.5% nebulizer solution   Commonly known as: PROVENTIL   Take 0.5 mLs (2.5 mg total) by nebulization every 4 (four) hours as needed for wheezing or shortness of breath.      amLODipine 10 MG tablet   Commonly known as: NORVASC   Take 10 mg by mouth daily.      aspirin 81 MG chewable tablet   Chew 1 tablet (81 mg total) by mouth daily.      calcium acetate 667 MG capsule   Commonly known as: PHOSLO   Take 1 capsule (667 mg total) by mouth 3 (three) times daily with meals.      cloNIDine 0.2 MG tablet   Commonly known as: CATAPRES   Take 0.2 mg by mouth 3 (three) times daily.      darbepoetin 60 MCG/0.3ML Soln   Commonly known as: ARANESP   Inject 0.3 mLs (60 mcg total) into the vein every Friday with hemodialysis.      insulin glargine 100 UNIT/ML injection   Commonly known as: LANTUS   Inject 18 Units into the skin at bedtime.      isosorbide mononitrate 120 MG 24 hr tablet   Commonly known as: IMDUR   Take 120 mg by mouth daily.      levothyroxine 25 MCG tablet   Commonly known as: SYNTHROID, LEVOTHROID   Take 25 mcg by mouth daily.      metoprolol tartrate 25 MG tablet   Commonly known as: LOPRESSOR   Take 1 tablet (25 mg total) by mouth 2 (two) times daily.      nitroGLYCERIN 0.4 MG SL tablet   Commonly known as: NITROSTAT   Place 0.4 mg under the tongue every 5 (five) minutes as needed. For chest pain.      oxyCODONE-acetaminophen 5-325 MG per tablet   Commonly known as: PERCOCET   Take 1 tablet by mouth every 6 (six) hours as needed. For pain.      pantoprazole 40 MG tablet   Commonly known as: PROTONIX   Take 40 mg by mouth daily.      PROBIOTIC PO   Take 1 capsule by mouth daily.      simvastatin 10 MG tablet   Commonly known as: ZOCOR   Take 2 tablets (20 mg total) by mouth daily at 6 PM.      sodium chloride 0.9 % SOLN 100 mL with ferric gluconate 12.5 MG/ML SOLN 62.5 mg   Inject 62.5 mg into the vein every Wednesday at 6 PM.  warfarin 2 MG tablet   Commonly known as: COUMADIN   Take 2 mg by mouth daily.              Consults: Treatment Team:  Dyke Maes, MD [facility], MD   SIGNIFICANT DIAGNOSTIC STUDIES:  Dg Chest 2 View  02/06/2012  *RADIOLOGY REPORT*  Clinical Data: Short of breath, pulmonary edema, former smoking history, diabetes  CHEST - 2 VIEW  Comparison: Portable chest x-ray of 02/06/2012  Findings: There has been some improvement in the edema pattern. Mild pulmonary vascular congestion remains with small pleural effusions.  Cardiomegaly is stable.  The right central venous line is unchanged with the tips in the lower SVC near the expected right atrial junction.  IMPRESSION: Improvement in edema pattern.  Small effusions and mild cardiomegaly remain.  Original Report Authenticated By: Juline Patch, M.D.   Dg Chest 2 View  02/04/2012  *RADIOLOGY REPORT*  Clinical Data: Recent pneumonia  CHEST - 2 VIEW  Comparison: 01/24/2012  Findings: Cardiomediastinal silhouette is stable.  Dual lumen  right IJ catheter is unchanged in position.  There is a small right pleural effusion with right basilar atelectasis or infiltrate.  New patchy airspace disease right upper lobe perihilar suspicious for pneumonia. Left lung is clear.  No pulmonary edema.  IMPRESSION:  There is a small right pleural effusion with right basilar atelectasis or infiltrate.  New patchy airspace disease right upper lobe perihilar suspicious for pneumonia.  Original Report Authenticated By: Natasha Mead, M.D.   Nm Myocar Multi W/spect W/wall Motion / Ef  02/11/2012  71 year old female with past medical history of coronary disease complaining of chest pain.  The study is performed to exclude ischemia.  This is a same day rest stress protocol.  30 mCi of Myoview were used for the stress images and 10 mCi of Myoview were used for the rest images.  Lexiscan was administered in the typical fashion.  The patients resting heart rate was 110 and following infusion 114. The blood pressure at rest was 160/67 and following infusion 110/58.  There was no chest pain during the study.  There were no new ST changes.  The study was terminated per protocol.  Scintigraphic results: the images were reconstructed in the short axis as well as the vertical and horizontal long axis.  The study was somewhat suboptimal due to bowel artifact.  The stress images reveal a small defect in the distal anterior wall and apex which was fixed.  There is a moderate to large defect in the inferior wall from the base to the apex.  When compared to the rest images there is partial reversibility that distribution.  The gated ejection fraction was 34%.  There was global hypokinesis with akinesis of the inferobasal wall.  End-systolic volume 73 ml.  End- diastolic volume 111 ml.  T I D - 1.19.  Final interpretation: abnormal Lexiscan Myoview with no chest pain or electrocardiographic changes.  The scintigraphic results show probable soft tissue attenuation in the distal anterior  wall. There is a prior inferior and apical infarct.  There appears to be moderate ischemia in the inferior wall noted predominately on the short axis views.  The gated ejection fraction was 34%.  There was global hypokinesis with akinesis of the inferobasal wall.  Original Report Authenticated By: Bobbie Stack Chest Port 1 View  02/07/2012  *RADIOLOGY REPORT*  Clinical Data: 71 year old female with pulmonary edema.  Acute-on- chronic respiratory failure.  Bilateral pleural effusions.  PORTABLE CHEST - 1 VIEW  Comparison: 02/06/2012 and earlier.  Findings: Portable semi upright AP view 0708 hours.  Stable right IJ approach dual lumen dialysis type catheter.  Stable cardiac size and mediastinal contours.  Worsening airspace opacity at both lung bases.  Superimposed veiling opacity compatible with small to moderate effusions.  No pneumothorax.  Vascular congestion is increased.  IMPRESSION: Interval worsening vascular congestion/interstitial edema, small to moderate bilateral effusions, and bibasilar ventilation.  Original Report Authenticated By: Harley Hallmark, M.D.   Dg Chest Port 1 View  02/06/2012  *RADIOLOGY REPORT*  Clinical Data: Respiratory distress.  PORTABLE CHEST - 1 VIEW  Comparison: 02/04/2012  Findings: Enlarged heart size with prominent pulmonary vascularity. Interval development of increased perihilar air space disease and infiltration or atelectasis in the lung bases.  Small bilateral pleural effusions.  Changes suggest developing congestive failure or fluid overload.  Right central venous catheter remains unchanged in position.  Previously demonstrated right lung airspace disease is obscured by the more confluent process today.  No pneumothorax.  IMPRESSION: Increasing congestive changes in the heart and lungs with bilateral pleural effusions and diffuse pulmonary edema.  Original Report Authenticated By: Marlon Pel, M.D.   Portable Chest Xray In Am  01/24/2012  *RADIOLOGY REPORT*   Clinical Data: Pneumonia  PORTABLE CHEST - 1 VIEW  Comparison: 01/23/2012; 10/23/2011; chest CT - 10/22/2011  Findings: Unchanged cardiac silhouette and mediastinal contours. Stable positioning of support apparatus.  Grossly unchanged right mid and lower lung heterogeneous air space opacities.  Grossly unchanged left basilar/retrocardiac heterogeneous opacities.  The pulmonary vasculature remains indistinct.  Persistent blunting of the right costophrenic angle suggesting a small right-sided pleural effusion. No pneumothorax.  Unchanged bones.  IMPRESSION: 1.  Grossly unchanged right mid and lower lung heterogeneous air space opacity worrisome for infection. 2.  Grossly unchanged left basilar/retrocardiac opacity, possibly atelectasis. 3.  Unchanged findings of mild pulmonary edema and small right- sided effusion.  Original Report Authenticated By: Waynard Reeds, M.D.   Portable Chest Xray  01/23/2012  *RADIOLOGY REPORT*  Clinical Data: Increased shortness of breath  PORTABLE CHEST - 1 VIEW  Comparison: Portable chest x-ray of 01/23/2012  Findings: There has been some improvement in airspace disease at the right lung base and in the right mid lung.  A small right effusion remains.  Mild cardiomegaly is stable.  There is still evidence of mild pulmonary vascular congestion.  Right central venous line is unchanged in position.  IMPRESSION: Some improvement in airspace disease.  Persistent cardiomegaly, right effusion, and mild pulmonary vascular congestion.  Original Report Authenticated By: Juline Patch, M.D.     ECHO: Left ventricle: Distal septal and apical severe hypokinesis inferior wall hypokinesis The cavity size was mildly dilated. There was mild concentric hypertrophy. The estimated ejection fraction was 40%. - Mitral valve: Mild regurgitation.       CARDIAC CATH & OTHER PROCEDURES: 1. 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  2. Attempted  angioplasty of the mid RCA. 3 balloon inflations but unable to restore adequate flow. Unable to place a stent secondary to vessel calcification and tortuosity.    Recent Results (from the past 240 hour(s))  CULTURE, BLOOD (ROUTINE X 2)     Status: Normal   Collection Time   02/04/12  3:40 PM      Component Value Range Status Comment   Specimen Description BLOOD FINGER LEFT   Final    Special  Requests BOTTLES DRAWN AEROBIC ONLY 4CC   Final    Culture  Setup Time [id number]   Final    Culture NO GROWTH 5 DAYS   Final    Report Status 02/10/2012 FINAL   Final   CULTURE, BLOOD (ROUTINE X 2)     Status: Normal   Collection Time   02/04/12  4:00 PM      Component Value Range Status Comment   Specimen Description BLOOD ARM LEFT   Final    Special Requests BOTTLES DRAWN AEROBIC ONLY 8CC   Final    Culture  Setup Time [id number]   Final    Culture NO GROWTH 5 DAYS   Final    Report Status 02/10/2012 FINAL   Final   MRSA PCR SCREENING     Status: Normal   Collection Time   02/06/12  2:25 AM      Component Value Range Status Comment   MRSA by PCR NEGATIVE  NEGATIVE  Final     BRIEF ADMITTING H & P: 71 year old lady with h/o copd on oxygen at home, who was recently discharged from the hospital 10 days ago after being treated for health care associated pneumonia and UTI. She reports shortness of breath, and worsening cough since 2 days. On arrival to ED , she was found to have oxygen saturations in low 80%'s. She was put on Non rebreather and her oxygen sats improved. 100% oxygen was removed and she was saturating 95% on 3 liters of nasal canula. cxr showed new pneumonia on the right upper lobe and possible atelectasis and pneumonia at the right lower base. She was started on iv antibiotics regimen for HCAP.   Active Hospital Problems  Diagnoses Date Noted   . ESRD (end stage renal disease) on dialysis: No changes were made  01/23/2012   . DM (diabetes mellitus): No changes were made she will  continue her Lantus to  10/11/2011   . Anemia: Per renal we'll continue our next aren't  10/11/2011     Resolved Hospital Problems  Diagnoses Date Noted Date Resolved  . NSTEMI (non-ST elevated myocardial infarction): She was complaining of shortness of breath, this was thought to be secondary to volume overload, cardiac enzymes were cycled which were slightly elevated a a 2-D echo was done and compared to previous 2-D echo that showed abnormal wall motion abnormality.  cardiology was consulted they recommended a stress test Myoview showed results as above. She was started on a beta blocker and statin, her heart rate and her blood pressure was controlled. And they recommended a cardiac catheterization. Which was performed. The RCA was chronically occluded and could not be stent to technical difficulties, so ballooning was done.. So cardiology recommended to continue medical management.  02/12/2012 02/14/2012  . Acute pulmonary edema:: This is probably secondary to episode of ischemia. She was dialyzed and this resolved.  02/06/2012 02/14/2012  . Acute-on-chronic respiratory failure: This was thought to be multifactorial related to volume overload in the mild acute bronchitis as well as a non-ST segment elevation MI and health care associated pneumonia. She completed her treatment of bronchitis here in the hospital.  01/23/2012 02/14/2012  . HCAP (healthcare-associated pneumonia): She completed her course in the hospital she has remained afebrile.  01/23/2012 02/14/2012  . COPD with acute exacerbation: On admission she was started on steroids albuterol treatments this is probably secondary to her healthcare associated pneumonia.  10/11/2011 02/14/2012     Disposition and Follow-up:  Discharge  Orders    Future Orders Please Complete By Expires   Diet - low sodium heart healthy      Diet - low sodium heart healthy      Discharge instructions      Comments:   Follow up with PCP as recommended.       Activity as tolerated - No restrictions      Increase activity slowly        Follow-up Information    Follow up with HOOPER,[name], MD in 2 weeks. (hospital follow up)           DISCHARGE EXAM:  SHe is alert afebrile in mild distress  CVS s1s2 heard tachycardic  Lungs clear  Abdomen soft non tender non distended bowel sounds heard  Extremities: no pedal edema.    Blood pressure 94/48, pulse 94, temperature 96.9 F (36.1 C), temperature source Oral, resp. rate 16, height 5\' 5"  (1.651 m), weight 59.2 kg (130 lb 8.2 oz), SpO2 94.00%.   Basename 02/14/12 0654 02/13/12 0700  NA 132* 135  K 4.4 4.2  CL 94* 93*  CO2 25 25  GLUCOSE 98 113*  BUN 43* 36*  CREATININE 4.33* 3.50*  CALCIUM 9.1 9.3  MG -- --  PHOS 5.0* 4.7*    Basename 02/14/12 0654 02/13/12 0700  AST -- --  ALT -- --  ALKPHOS -- --  BILITOT -- --  PROT -- --  ALBUMIN 2.9* 3.1*   No results found for this basename: LIPASE:2,AMYLASE:2 in the last 72 hours  Basename 02/14/12 0653 02/13/12 0700  WBC 10.8* 8.6  NEUTROABS -- --  HGB 10.1* 11.3*  HCT 32.0* 36.2  MCV 86.7 88.7  PLT 420* 373    Signed: Marinda Elk M.D. 02/14/2012, 8:35 AM

## 2012-02-14 NOTE — Progress Notes (Signed)
Notified [name] [name] of B/P 79/40's Made aware that she had received Hydralazine 10mg  & Lopressor 12.5 mg mg earlier. New orders received. Saline bolus hung

## 2012-02-14 NOTE — Progress Notes (Signed)
Progress Note  Name:  [name] [name]  Date:  02/14/2012 7:56 AM    Subjective:  Doing well this am.  Reports back pain but denies CP or SOB.   Objective:   Filed Vitals:   02/14/12 0215 02/14/12 0400 02/14/12 0500 02/14/12 0630  BP: 101/46 117/46 144/59 157/65  Pulse: 96 100 102 100  Temp:  97.1 F (36.2 C)  96.9 F (36.1 C)  TempSrc:  Oral  Oral  Resp: 17 17 17 17   Height:      Weight:    130 lb 8.2 oz (59.2 kg)  SpO2: 94% 94% 92% 94%    Intake/Output Summary (Last 24 hours) at 02/14/12 0756 Last data filed at 02/14/12 0600  Gross per 24 hour  Intake    830 ml  Output    625 ml  Net    205 ml    Tele:  Sinus rhythm, HR 100s  Physical Exam:  GEN- The patient is chronically ill appearing, alert and oriented x 3 today.   Head- normocephalic, atraumatic Eyes-  Sclera clear, conjunctiva pink Ears- hearing intact Oropharynx- clear Lungs- Clear to ausculation bilaterally, normal work of breathing Heart- Regular rate and rhythm,  GI- soft, NT, ND, + BS Extremities- no clubbing, cyanosis, + dependant edema   Labs:  Basename 02/13/12 0700 02/11/12 1200  NA 135 130*  K 4.2 4.2  CL 93* 92*  CO2 25 27  GLUCOSE 113* 199*  BUN 36* 41*  CREATININE 3.50* 3.93*  CALCIUM 9.3 9.0  MG -- --  PHOS 4.7* 4.8*    Basename 02/13/12 0700 02/11/12 1200  AST -- --  ALT -- --  ALKPHOS -- --  BILITOT -- --  PROT -- --  ALBUMIN 3.1* 2.9*   No results found for this basename: LIPASE:2,AMYLASE:2 in the last 72 hours  Basename 02/13/12 0700 02/11/12 1200  WBC 8.6 11.5*  NEUTROABS -- --  HGB 11.3* 11.6*  HCT 36.2 36.9  MCV 88.7 87.9  PLT 373 389    Radiology/Studies:  Dg Chest 2 View  02/06/2012   IMPRESSION: Improvement in edema pattern.  Small effusions and mild cardiomegaly remain.  Original Report Authenticated By: Juline Patch, M.D.   Dg Chest 2 View  02/04/2012   IMPRESSION:  There is a small right pleural effusion with right basilar atelectasis or infiltrate.  New  patchy airspace disease right upper lobe perihilar suspicious for pneumonia.  Original Report Authenticated By: Natasha Mead, M.D.   Nm Myocar Multi W/spect W/wall Motion / Ef  02/11/2012    Final interpretation: abnormal Lexiscan Myoview with no chest pain or electrocardiographic changes.  The scintigraphic results show probable soft tissue attenuation in the distal anterior wall. There is a prior inferior and apical infarct.  There appears to be moderate ischemia in the inferior wall noted predominately on the short axis views.  The gated ejection fraction was 34%.  There was global hypokinesis with akinesis of the inferobasal wall.  Original Report Authenticated By: Bobbie Stack Chest Port 1 View  02/07/2012   IMPRESSION: Interval worsening vascular congestion/interstitial edema, small to moderate bilateral effusions, and bibasilar ventilation.  Original Report Authenticated By: Harley Hallmark, M.D.   Dg Chest Port 1 View  02/06/2012 IMPRESSION: Increasing congestive changes in the heart and lungs with bilateral pleural effusions and diffuse pulmonary edema.  Original Report Authenticated By: Marlon Pel, M.D.   Portable Chest Xray In Am  01/24/2012   IMPRESSION: 1.  Grossly  unchanged right mid and lower lung heterogeneous air space opacity worrisome for infection. 2.  Grossly unchanged left basilar/retrocardiac opacity, possibly atelectasis. 3.  Unchanged findings of mild pulmonary edema and small right- sided effusion.  Original Report Authenticated By: Waynard Reeds, M.D.   Portable Chest Xray  01/23/2012   IMPRESSION: Some improvement in airspace disease.  Persistent cardiomegaly, right effusion, and mild pulmonary vascular congestion.  Original Report Authenticated By: Juline Patch, M.D.   2D Echo  Study Conclusions  - Left ventricle: Distal septal and apical severe hypokinesis inferior wall hypokinesis The cavity size was mildly dilated. There was mild concentric hypertrophy.  The estimated ejection fraction was 40%. - Mitral valve: Mild regurgitation. - Atrial septum: No defect or patent foramen ovale was identified. - Pulmonary arteries: PA peak pressure: 46mm Hg (S). - Pericardium, extracardiac: A trivial pericardial effusion was identified.   Cardiac Cath 02/13/12: Angiographic Findings:  Left main: Heavily calcified vessel with 20% distal stenosis.  Left Anterior Descending Artery: Large caliber vessel that courses the apex. The proximal vessel is heavily calcified. The proximal vessel has diffuse 40% stenosis. There is a discreet 40% stenosis just before the first diagonal branch. The distal LAD has diffuse 99% stenosis at the apex.  Circumflex Artery: Large caliber vessel with very small first and second marginal branches. The second OM branch is 1.0 mm and has a 90% ostial stenosis (too small for PCI). The third OM has minor plaque disease.  Right Coronary Artery: Large dominant vessel. The proximal vessel has a tubular 90% stenosis. The mid vessel has a chronic, sub-total occlusion. The distal vessel fills both from antegrade flow and from left to right collaterals.  Left Ventricular Angiogram: No LV gram  Impression:  1. 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  2. Attempted angioplasty of the mid RCA. 3 balloon inflations but unable to restore adequate flow. Unable to place a stent secondary to vessel calcification and tortuosity.  Recommendations: I would continue medical management. She was loaded with Plavix today in the cath lab but no indication for long term Plavix, especially given the fact that she is on long term anti-coagulation with coumadin. She will follow up with Dr. Dietrich Pates after discharge.   Cardiac Meds: ASA 81 Imdur 120 QD Lopressor 12.5 bid Zocor 10 Warfarin  Assessment and Plan:  1) NSTEMI: EF 40% with septal and apical hypokinesis by Echo. Lexiscan Myoview shows apical  infarct with inferior wall ischemia and infarct. EF 34%. Chronic sub total occlusion of mRCA on cath with good filling from L to R collats (unsuccessful attempt at angioplasty), dLAD 99% and small oOM2 90%   Continue medical therapy long term  2) Ischemic Cardiomyopathy - HR high, will increase Lopressor to 25 mg bid today.  If BP allows, eventually try to add low dose ACE vs hydralazine.    3) Systolic CHF - volume management per dialysis  4) Respiratory Failure due to combo of pulmonary edema and NSTEMI, COPD, bronchitis - tx with Antibiotics pre primary team.  5) Chronic anticoagulation: Secondary to VTE. management per pharmacy.   6) ESRD on hemodialysis   7) hyperlipidemia - on Zocor  Odin Mariani,MD 8:23 AM 02/14/2012

## 2012-02-14 NOTE — Progress Notes (Signed)
Order received and appreciated. Chart reviewed. Pt did not want to ambulate at this time. Discharge education completed. Covered MI, education booklet, diet, home exercise, signs & symptoms, restrictions, and phase II cardiac rehab. Pt not interested in phase II cardiac rehab. Pt voiced understanding to education.  Electronically signed by Harriett Sine MS on Saturday February 14 2012 at 1221

## 2012-02-16 MED FILL — Dextrose Inj 5%: INTRAVENOUS | Qty: 50 | Status: AC

## 2012-02-23 ENCOUNTER — Inpatient Hospital Stay (HOSPITAL_COMMUNITY)
Admission: AD | Admit: 2012-02-23 | Discharge: 2012-02-27 | DRG: 193 | Disposition: A | Discharge status: Home / Self Care | Payer: Medicare Other | Source: Other Acute Inpatient Hospital | Attending: Internal Medicine | Admitting: Internal Medicine

## 2012-02-23 ENCOUNTER — Encounter (HOSPITAL_COMMUNITY): Payer: Self-pay | Admitting: *Deleted

## 2012-02-23 ENCOUNTER — Inpatient Hospital Stay (HOSPITAL_COMMUNITY): Payer: Medicare Other

## 2012-02-23 DIAGNOSIS — I214 Non-ST elevation (NSTEMI) myocardial infarction: Secondary | ICD-10-CM | POA: Diagnosis present

## 2012-02-23 DIAGNOSIS — Z7982 Long term (current) use of aspirin: Secondary | ICD-10-CM

## 2012-02-23 DIAGNOSIS — I509 Heart failure, unspecified: Secondary | ICD-10-CM | POA: Diagnosis not present

## 2012-02-23 DIAGNOSIS — Z833 Family history of diabetes mellitus: Secondary | ICD-10-CM

## 2012-02-23 DIAGNOSIS — N2581 Secondary hyperparathyroidism of renal origin: Secondary | ICD-10-CM | POA: Diagnosis present

## 2012-02-23 DIAGNOSIS — N186 End stage renal disease: Secondary | ICD-10-CM | POA: Diagnosis present

## 2012-02-23 DIAGNOSIS — Z7901 Long term (current) use of anticoagulants: Secondary | ICD-10-CM

## 2012-02-23 DIAGNOSIS — J449 Chronic obstructive pulmonary disease, unspecified: Secondary | ICD-10-CM | POA: Diagnosis present

## 2012-02-23 DIAGNOSIS — E119 Type 2 diabetes mellitus without complications: Secondary | ICD-10-CM | POA: Diagnosis present

## 2012-02-23 DIAGNOSIS — Z86711 Personal history of pulmonary embolism: Secondary | ICD-10-CM | POA: Diagnosis present

## 2012-02-23 DIAGNOSIS — I739 Peripheral vascular disease, unspecified: Secondary | ICD-10-CM | POA: Diagnosis present

## 2012-02-23 DIAGNOSIS — E875 Hyperkalemia: Secondary | ICD-10-CM | POA: Diagnosis present

## 2012-02-23 DIAGNOSIS — I10 Essential (primary) hypertension: Secondary | ICD-10-CM | POA: Diagnosis present

## 2012-02-23 DIAGNOSIS — I2510 Atherosclerotic heart disease of native coronary artery without angina pectoris: Secondary | ICD-10-CM | POA: Diagnosis present

## 2012-02-23 DIAGNOSIS — I120 Hypertensive chronic kidney disease with stage 5 chronic kidney disease or end stage renal disease: Secondary | ICD-10-CM | POA: Diagnosis present

## 2012-02-23 DIAGNOSIS — Z9981 Dependence on supplemental oxygen: Secondary | ICD-10-CM

## 2012-02-23 DIAGNOSIS — N390 Urinary tract infection, site not specified: Secondary | ICD-10-CM | POA: Diagnosis present

## 2012-02-23 DIAGNOSIS — Z87891 Personal history of nicotine dependence: Secondary | ICD-10-CM

## 2012-02-23 DIAGNOSIS — S98139A Complete traumatic amputation of one unspecified lesser toe, initial encounter: Secondary | ICD-10-CM

## 2012-02-23 DIAGNOSIS — J4489 Other specified chronic obstructive pulmonary disease: Secondary | ICD-10-CM | POA: Diagnosis present

## 2012-02-23 DIAGNOSIS — I498 Other specified cardiac arrhythmias: Secondary | ICD-10-CM | POA: Diagnosis present

## 2012-02-23 DIAGNOSIS — E7800 Pure hypercholesterolemia, unspecified: Secondary | ICD-10-CM | POA: Diagnosis present

## 2012-02-23 DIAGNOSIS — E039 Hypothyroidism, unspecified: Secondary | ICD-10-CM | POA: Diagnosis present

## 2012-02-23 DIAGNOSIS — Z992 Dependence on renal dialysis: Secondary | ICD-10-CM

## 2012-02-23 DIAGNOSIS — J189 Pneumonia, unspecified organism: Principal | ICD-10-CM | POA: Diagnosis present

## 2012-02-23 MED ORDER — ALUM & MAG HYDROXIDE-SIMETH 200-200-20 MG/5ML PO SUSP: 30.0000 mL | Freq: Four times a day (QID) | ORAL | Status: DC | PRN

## 2012-02-23 MED ORDER — INSULIN ASPART 100 UNIT/ML ~~LOC~~ SOLN
0.0000 [IU] | Freq: Every day | SUBCUTANEOUS | Status: DC
  Administered 2012-02-24: 4 [IU] via SUBCUTANEOUS
  Administered 2012-02-25: 3 [IU] via SUBCUTANEOUS
  Administered 2012-02-26: 2 [IU] via SUBCUTANEOUS

## 2012-02-23 MED ORDER — DEXTROSE 5 % IV SOLN: 1.0000 g | Freq: Every day | INTRAVENOUS | Status: DC

## 2012-02-23 MED ORDER — INSULIN ASPART 100 UNIT/ML ~~LOC~~ SOLN
0.0000 [IU] | Freq: Three times a day (TID) | SUBCUTANEOUS | Status: DC
  Administered 2012-02-24: 15 [IU] via SUBCUTANEOUS
  Administered 2012-02-24: 11 [IU] via SUBCUTANEOUS
  Administered 2012-02-25: 8 [IU] via SUBCUTANEOUS
  Administered 2012-02-26: 5 [IU] via SUBCUTANEOUS
  Administered 2012-02-26: 3 [IU] via SUBCUTANEOUS
  Administered 2012-02-27: 5 [IU] via SUBCUTANEOUS
  Administered 2012-02-27: 3 [IU] via SUBCUTANEOUS
  Administered 2012-02-27: 2 [IU] via SUBCUTANEOUS

## 2012-02-23 MED ORDER — SODIUM CHLORIDE 0.9 % IV SOLN: 1.0000 g | Freq: Every day | INTRAVENOUS | Status: DC

## 2012-02-23 MED ORDER — HEPARIN SODIUM (PORCINE) 5000 UNIT/ML IJ SOLN
5000.0000 [IU] | Freq: Three times a day (TID) | INTRAMUSCULAR | Status: DC
  Filled 2012-02-23: qty 1

## 2012-02-23 MED ORDER — ALBUTEROL SULFATE (5 MG/ML) 0.5% IN NEBU: 2.5000 mg | INHALATION_SOLUTION | RESPIRATORY_TRACT | Status: DC | PRN

## 2012-02-23 MED ORDER — FUROSEMIDE 10 MG/ML IJ SOLN
80.0000 mg | Freq: Once | INTRAMUSCULAR | Status: AC
  Administered 2012-02-24: 80 mg via INTRAVENOUS
  Filled 2012-02-23: qty 8

## 2012-02-23 MED ORDER — DEXTROSE 5 % IV SOLN
500.0000 mg | Freq: Once | INTRAVENOUS | Status: DC
  Filled 2012-02-23: qty 500

## 2012-02-23 MED ORDER — ONDANSETRON HCL 4 MG/2ML IJ SOLN: 4.0000 mg | Freq: Four times a day (QID) | INTRAMUSCULAR | Status: DC | PRN

## 2012-02-23 MED ORDER — DEXTROSE 5 % IV SOLN
500.0000 mg | INTRAVENOUS | Status: DC
  Administered 2012-02-24 – 2012-02-25 (×2): 500 mg via INTRAVENOUS
  Filled 2012-02-23 (×3): qty 500

## 2012-02-23 MED ORDER — SODIUM CHLORIDE 0.9 % IJ SOLN: 3.0000 mL | INTRAMUSCULAR | Status: DC | PRN

## 2012-02-23 MED ORDER — BENZONATATE 100 MG PO CAPS: 100.0000 mg | ORAL_CAPSULE | Freq: Three times a day (TID) | ORAL | Status: DC | PRN

## 2012-02-23 MED ORDER — IPRATROPIUM BROMIDE 0.02 % IN SOLN: 0.5000 mg | RESPIRATORY_TRACT | Status: DC | PRN

## 2012-02-23 MED ORDER — SODIUM CHLORIDE 0.9 % IJ SOLN
3.0000 mL | Freq: Two times a day (BID) | INTRAMUSCULAR | Status: DC
  Administered 2012-02-24 – 2012-02-27 (×7): 3 mL via INTRAVENOUS

## 2012-02-23 MED ORDER — ONDANSETRON HCL 4 MG PO TABS: 4.0000 mg | ORAL_TABLET | Freq: Four times a day (QID) | ORAL | Status: DC | PRN

## 2012-02-24 ENCOUNTER — Encounter (HOSPITAL_COMMUNITY): Payer: Self-pay | Admitting: Family Medicine

## 2012-02-24 DIAGNOSIS — J189 Pneumonia, unspecified organism: Secondary | ICD-10-CM | POA: Diagnosis present

## 2012-02-24 DIAGNOSIS — I509 Heart failure, unspecified: Secondary | ICD-10-CM | POA: Diagnosis not present

## 2012-02-24 LAB — CBC
HCT: 43.4 % (ref 36.0–46.0)
Hemoglobin: 11.6 g/dL — ABNORMAL LOW (ref 12.0–15.0)
MCV: 92.1 fL (ref 78.0–100.0)
Platelets: 277 10*3/uL (ref 150–400)
RBC: 4.02 MIL/uL (ref 3.87–5.11)
RDW: 22.1 % — ABNORMAL HIGH (ref 11.5–15.5)
WBC: 4.4 10*3/uL (ref 4.0–10.5)
WBC: 4.3 10*3/uL (ref 4.0–10.5)

## 2012-02-24 LAB — COMPREHENSIVE METABOLIC PANEL
ALT: 9 U/L (ref 0–35)
AST: 18 U/L (ref 0–37)
Alkaline Phosphatase: 87 U/L (ref 39–117)
CO2: 25 mEq/L (ref 19–32)
Chloride: 91 mEq/L — ABNORMAL LOW (ref 96–112)
GFR calc non Af Amer: 18 mL/min — ABNORMAL LOW (ref >90)
Sodium: 132 mEq/L — ABNORMAL LOW (ref 135–145)
Total Bilirubin: 0.2 mg/dL — ABNORMAL LOW (ref 0.3–1.2)

## 2012-02-24 LAB — BASIC METABOLIC PANEL
BUN: 21 mg/dL (ref 6–23)
CO2: 18 mEq/L — ABNORMAL LOW (ref 19–32)
Chloride: 90 mEq/L — ABNORMAL LOW (ref 96–112)
Creatinine, Ser: 2.75 mg/dL — ABNORMAL HIGH (ref 0.50–1.10)
Glucose, Bld: 279 mg/dL — ABNORMAL HIGH (ref 70–99)

## 2012-02-24 LAB — GLUCOSE, CAPILLARY
Glucose-Capillary: 523 mg/dL — ABNORMAL HIGH (ref 70–99)
Glucose-Capillary: 344 mg/dL — ABNORMAL HIGH (ref 70–99)
Glucose-Capillary: 323 mg/dL — ABNORMAL HIGH (ref 70–99)

## 2012-02-24 LAB — PRO B NATRIURETIC PEPTIDE: Pro B Natriuretic peptide (BNP): 31044 pg/mL — ABNORMAL HIGH (ref 0–125)

## 2012-02-24 MED ORDER — PENTAFLUOROPROP-TETRAFLUOROETH EX AERO: 1.0000 "application " | INHALATION_SPRAY | CUTANEOUS | Status: DC | PRN

## 2012-02-24 MED ORDER — WARFARIN SODIUM 3 MG PO TABS: 3.0000 mg | ORAL_TABLET | Freq: Every day | ORAL | Status: DC

## 2012-02-24 MED ORDER — SORBITOL 70 % SOLN: 30.0000 mL | Status: DC | PRN

## 2012-02-24 MED ORDER — LEVOTHYROXINE SODIUM 25 MCG PO TABS
25.0000 ug | ORAL_TABLET | Freq: Every day | ORAL | Status: DC
  Administered 2012-02-24 – 2012-02-27 (×4): 25 ug via ORAL
  Filled 2012-02-24 (×5): qty 1

## 2012-02-24 MED ORDER — SODIUM CHLORIDE 0.9 % IV SOLN
1.0000 g | Freq: Once | INTRAVENOUS | Status: DC
  Filled 2012-02-24: qty 1

## 2012-02-24 MED ORDER — SODIUM CHLORIDE 0.9 % IV SOLN
500.0000 mg | Freq: Every day | INTRAVENOUS | Status: AC
  Administered 2012-02-24 – 2012-02-26 (×3): 0.5 g via INTRAVENOUS
  Filled 2012-02-24 (×3): qty 0.5

## 2012-02-24 MED ORDER — SIMVASTATIN 20 MG PO TABS
20.0000 mg | ORAL_TABLET | Freq: Every day | ORAL | Status: DC
  Administered 2012-02-24 – 2012-02-27 (×4): 20 mg via ORAL
  Filled 2012-02-24 (×5): qty 1

## 2012-02-24 MED ORDER — HYDROXYZINE HCL 25 MG PO TABS: 25.0000 mg | ORAL_TABLET | Freq: Three times a day (TID) | ORAL | Status: DC | PRN

## 2012-02-24 MED ORDER — PARICALCITOL 5 MCG/ML IV SOLN
1.0000 ug | INTRAVENOUS | Status: DC
  Administered 2012-02-25 – 2012-02-27 (×2): 1 ug via INTRAVENOUS
  Filled 2012-02-24 (×2): qty 0.2

## 2012-02-24 MED ORDER — SODIUM CHLORIDE 0.9 % IV SOLN: 100.0000 mL | INTRAVENOUS | Status: DC | PRN

## 2012-02-24 MED ORDER — CALCIUM CARBONATE 1250 MG/5ML PO SUSP: 500.0000 mg | Freq: Four times a day (QID) | ORAL | Status: DC | PRN

## 2012-02-24 MED ORDER — VANCOMYCIN HCL 1000 MG IV SOLR
1250.0000 mg | Freq: Once | INTRAVENOUS | Status: DC
  Filled 2012-02-24: qty 1250

## 2012-02-24 MED ORDER — AMLODIPINE BESYLATE 10 MG PO TABS
10.0000 mg | ORAL_TABLET | Freq: Every day | ORAL | Status: DC
  Administered 2012-02-24: 10 mg via ORAL
  Filled 2012-02-24: qty 1

## 2012-02-24 MED ORDER — LIDOCAINE HCL (PF) 1 % IJ SOLN: 5.0000 mL | INTRAMUSCULAR | Status: DC | PRN

## 2012-02-24 MED ORDER — ZOLPIDEM TARTRATE 5 MG PO TABS: 5.0000 mg | ORAL_TABLET | Freq: Every evening | ORAL | Status: DC | PRN

## 2012-02-24 MED ORDER — PANTOPRAZOLE SODIUM 40 MG PO TBEC
40.0000 mg | DELAYED_RELEASE_TABLET | Freq: Every day | ORAL | Status: DC
  Administered 2012-02-24 – 2012-02-27 (×4): 40 mg via ORAL
  Filled 2012-02-24 (×4): qty 1

## 2012-02-24 MED ORDER — WARFARIN SODIUM 4 MG PO TABS
4.0000 mg | ORAL_TABLET | Freq: Once | ORAL | Status: AC
  Administered 2012-02-24: 4 mg via ORAL
  Filled 2012-02-24: qty 1

## 2012-02-24 MED ORDER — ONDANSETRON HCL 4 MG PO TABS: 4.0000 mg | ORAL_TABLET | Freq: Four times a day (QID) | ORAL | Status: DC | PRN

## 2012-02-24 MED ORDER — ONDANSETRON HCL 4 MG/2ML IJ SOLN: 4.0000 mg | Freq: Four times a day (QID) | INTRAMUSCULAR | Status: DC | PRN

## 2012-02-24 MED ORDER — CLONIDINE HCL 0.2 MG PO TABS
0.2000 mg | ORAL_TABLET | Freq: Three times a day (TID) | ORAL | Status: DC
  Administered 2012-02-24 – 2012-02-26 (×5): 0.2 mg via ORAL
  Filled 2012-02-24 (×12): qty 1

## 2012-02-24 MED ORDER — NEPRO/CARBSTEADY PO LIQD
237.0000 mL | Freq: Three times a day (TID) | ORAL | Status: DC
  Administered 2012-02-24 – 2012-02-27 (×9): 237 mL via ORAL

## 2012-02-24 MED ORDER — DOCUSATE SODIUM 283 MG RE ENEM: 1.0000 | ENEMA | RECTAL | Status: DC | PRN

## 2012-02-24 MED ORDER — ISOSORBIDE MONONITRATE ER 60 MG PO TB24
120.0000 mg | ORAL_TABLET | Freq: Every day | ORAL | Status: DC
  Administered 2012-02-24 – 2012-02-26 (×3): 120 mg via ORAL
  Filled 2012-02-24 (×4): qty 2

## 2012-02-24 MED ORDER — OXYCODONE-ACETAMINOPHEN 5-325 MG PO TABS: 1.0000 | ORAL_TABLET | Freq: Four times a day (QID) | ORAL | Status: DC | PRN

## 2012-02-24 MED ORDER — VANCOMYCIN HCL 500 MG IV SOLR
500.0000 mg | INTRAVENOUS | Status: DC
  Administered 2012-02-25: 500 mg via INTRAVENOUS
  Filled 2012-02-24 (×2): qty 500

## 2012-02-24 MED ORDER — HEPARIN SODIUM (PORCINE) 1000 UNIT/ML DIALYSIS
20.0000 [IU]/kg | INTRAMUSCULAR | Status: DC | PRN
  Filled 2012-02-24: qty 2

## 2012-02-24 MED ORDER — NEPRO/CARBSTEADY PO LIQD: 237.0000 mL | ORAL | Status: DC | PRN

## 2012-02-24 MED ORDER — CAMPHOR-MENTHOL 0.5-0.5 % EX LOTN
1.0000 "application " | TOPICAL_LOTION | Freq: Three times a day (TID) | CUTANEOUS | Status: DC | PRN
  Filled 2012-02-24: qty 222

## 2012-02-24 MED ORDER — CALCIUM ACETATE 667 MG PO CAPS
667.0000 mg | ORAL_CAPSULE | Freq: Three times a day (TID) | ORAL | Status: DC
  Administered 2012-02-24 – 2012-02-27 (×11): 667 mg via ORAL
  Filled 2012-02-24 (×13): qty 1

## 2012-02-24 MED ORDER — METOPROLOL TARTRATE 50 MG PO TABS
50.0000 mg | ORAL_TABLET | Freq: Two times a day (BID) | ORAL | Status: DC
  Filled 2012-02-24 (×3): qty 1

## 2012-02-24 MED ORDER — LIDOCAINE-PRILOCAINE 2.5-2.5 % EX CREA: 1.0000 "application " | TOPICAL_CREAM | CUTANEOUS | Status: DC | PRN

## 2012-02-24 MED ORDER — WARFARIN - PHARMACIST DOSING INPATIENT
Freq: Every day | Status: DC
  Administered 2012-02-25 – 2012-02-27 (×3)

## 2012-02-24 MED ORDER — ACETAMINOPHEN 650 MG RE SUPP: 650.0000 mg | Freq: Four times a day (QID) | RECTAL | Status: DC | PRN

## 2012-02-24 MED ORDER — ASPIRIN 81 MG PO CHEW
81.0000 mg | CHEWABLE_TABLET | Freq: Every day | ORAL | Status: DC
  Administered 2012-02-24 – 2012-02-27 (×4): 81 mg via ORAL
  Filled 2012-02-24 (×4): qty 1

## 2012-02-24 MED ORDER — AMLODIPINE BESYLATE 10 MG PO TABS
10.0000 mg | ORAL_TABLET | Freq: Every day | ORAL | Status: DC
  Filled 2012-02-24 (×2): qty 1

## 2012-02-24 MED ORDER — ACETAMINOPHEN 325 MG PO TABS: 650.0000 mg | ORAL_TABLET | Freq: Four times a day (QID) | ORAL | Status: DC | PRN

## 2012-02-24 MED ORDER — INSULIN GLARGINE 100 UNIT/ML ~~LOC~~ SOLN
18.0000 [IU] | Freq: Every day | SUBCUTANEOUS | Status: DC
  Administered 2012-02-24 – 2012-02-26 (×3): 18 [IU] via SUBCUTANEOUS

## 2012-02-24 MED ORDER — HEPARIN SODIUM (PORCINE) 1000 UNIT/ML DIALYSIS
1000.0000 [IU] | INTRAMUSCULAR | Status: DC | PRN
  Filled 2012-02-24: qty 1

## 2012-02-24 MED ORDER — METOPROLOL TARTRATE 25 MG PO TABS
25.0000 mg | ORAL_TABLET | Freq: Two times a day (BID) | ORAL | Status: DC
  Administered 2012-02-24: 25 mg via ORAL
  Filled 2012-02-24 (×2): qty 1

## 2012-02-24 MED ORDER — NITROGLYCERIN 0.4 MG SL SUBL: 0.4000 mg | SUBLINGUAL_TABLET | SUBLINGUAL | Status: DC | PRN

## 2012-02-24 MED ORDER — ALTEPLASE 2 MG IJ SOLR
2.0000 mg | Freq: Once | INTRAMUSCULAR | Status: AC | PRN
  Filled 2012-02-24: qty 2

## 2012-02-24 NOTE — Progress Notes (Signed)
PHARMACY - ANTICOAGULATION CONSULT Initial Consult Note  Pharmacy Consult for: Coumadin  Indication: H/o pulmonary embolism / DVT    Patient Data:   Allergies: Allergies  Allergen Reactions  . Novocain Other (See Comments)    Unknown    Patient Measurements: Height: 5\' 5"  (165.1 cm) Weight: 128 lb 15.5 oz (58.5 kg) IBW/kg (Calculated) : 57   Vital Signs: Temp:  [98.9 F (37.2 C)] 98.9 F (37.2 C) (04/22 2145) Pulse Rate:  [101-103] 101  (04/23 0143) Resp:  [18] 18  (04/22 2145) BP: (153-170)/(73-76) 153/76 mmHg (04/23 0143) SpO2:  [90 %] 90 % (04/22 2145) Weight:  [128 lb 15.5 oz (58.5 kg)] 128 lb 15.5 oz (58.5 kg) (04/22 2145)  Intake/Output from previous day:  Intake/Output Summary (Last 24 hours) at 02/24/12 0211 Last data filed at 02/23/12 2145  Gross per 24 hour  Intake    240 ml  Output      0 ml  Net    240 ml    Labs:  Basename 02/24/12 0100  HGB 11.6*  HCT 37.5  PLT 277  APTT --  LABPROT 23.1*  INR 2.01*  HEPARINUNFRC --  CREATININE 2.53*  CKTOTAL --  CKMB --  TROPONINI --   Estimated Creatinine Clearance: 19.2 ml/min (by C-G formula based on Cr of 2.53).  Medical History: Past Medical History  Diagnosis Date  . Diabetes mellitus   . Hypercholesterolemia   . Hypothyroidism   . Colitis   . Chronic kidney disease   . Coronary artery disease   . Hypertension   . GERD (gastroesophageal reflux disease)   . Myocardial infarction   . Peripheral vascular disease   . COPD (chronic obstructive pulmonary disease)   . Pneumonia   . Angina   . Shortness of breath     Scheduled medications:     . amLODipine  10 mg Oral Daily  . aspirin  81 mg Oral Daily  . azithromycin  500 mg Intravenous Q24H  . calcium acetate  667 mg Oral TID WC  . cloNIDine  0.2 mg Oral TID  . ertapenem  1 g Intravenous QHS  . furosemide  80 mg Intravenous Once  . heparin  5,000 Units Subcutaneous Q8H  . insulin aspart  0-15 Units Subcutaneous TID WC  . insulin  aspart  0-5 Units Subcutaneous QHS  . insulin glargine  18 Units Subcutaneous QHS  . isosorbide mononitrate  120 mg Oral Daily  . levothyroxine  25 mcg Oral QAC breakfast  . metoprolol tartrate  25 mg Oral BID  . pantoprazole  40 mg Oral Q1200  . simvastatin  20 mg Oral q1800  . sodium chloride  3 mL Intravenous Q12H  . DISCONTD: azithromycin  500 mg Intravenous Once  . DISCONTD: cefTRIAXone (ROCEPHIN)  IV  1 g Intravenous QHS  . DISCONTD: warfarin  3 mg Oral Daily     Assessment:  71 y.o. female with h/o pulmonary embolism/DVT admitted on 02/23/2012, with possible pneumonia. Pharmacy consulted to manage Coumadin. Home Coumadin regimen of 3mg  daily. INR is at lower-end of goal range.   Goal of Therapy:  1. INR 2-3  Plan:  1. Coumadin 4mg  po today.  2. Daily PT / INR.   Dineen Kid Thad Ranger, PharmD 02/24/2012, 2:11 AM

## 2012-02-24 NOTE — Consult Note (Signed)
[facility] Renal Consultation Note    Indication for Consultation:  Management of ESRD/hemodialysis; anemia, hypertension/volume and secondary hyperparathyroidism  HPI: [name] [name] is a 70 y.o. female with ESRD on HD since December 2012 was recently discharged 4/13 following an admission for PNA and NSTEMI.  She states she has been sleeping a lot since Saturday and coughing up green stuff.  She quit smoking 6 months ago. She is weak and, but her appetite is good.  She denies SOB, chest pain, abdominal pain, N, V or D.  She denies constipation, numbness or tingling of LE.  She doesn't wear her dentures. It is noted that some of her history to me is inconsistent with what she told the hospitalist.  She was recently diagnosed with recurrent ESBL + E Coli UTI 4/19 cultured at her outpatient HD center. She had been treated for this during a March hospitalization here and she was to have been referred to urology in [facility] 4/25. She was noted to be tachycardic, SOB with rhonchi at her dialysis center yesterday prior to being transported to the [place] ED prior to transfer to [facility]  Dialysis Orders: Center: [facility]   on MWF EDW 57 HD Bath 2K 2.5 Ca Time 3.5 Heparin 1200 Access r I-J and maturing right upper AVF BFR 400 DFR A 1.5 Zemplar IV/HD Epogen  16,000  Units IV/HD  Venofer none  Past Medical History  Diagnosis Date  . Diabetes mellitus   . Hypercholesterolemia   . Hypothyroidism   . Colitis   . Chronic kidney disease   . Coronary artery disease   . Hypertension   . GERD (gastroesophageal reflux disease)   . Myocardial infarction   . Peripheral vascular disease   . COPD (chronic obstructive pulmonary disease)   . Pneumonia   . Angina   . Shortness of breath    Past Surgical History  Procedure Date  . Tubal ligation   . Amputaton of right fifth toe   . Tonsillectomy   . Insertion of dialysis catheter 10/23/2011    Procedure: INSERTION OF DIALYSIS CATHETER;   Surgeon: Chuck Hint, MD;  Location: [facility] [place] OR;  Service: Vascular;  Laterality: Right;  Right internal jugular Insertion diatek catheter   Family History  Problem Relation Age of Onset  . Diabetes type II    . Hypertension     Social History:  reports that she quit smoking about 8 months ago. Her smoking use included Cigarettes. She has a 40 pack-year smoking history. She does not have any smokeless tobacco history on file. She reports that she does not drink alcohol or use illicit drugs. Allergies  Allergen Reactions  . Novocain Other (See Comments)    Unknown   Prior to Admission medications   Medication Sig Start Date End Date Taking? Authorizing Provider  albuterol (PROVENTIL) (5 MG/ML) 0.5% nebulizer solution Take 2.5 mg by nebulization every 4 (four) hours as needed. For wheezing/shortness of breath. 02/10/12 02/09/13 Yes Kathlen Mody, MD  amLODipine (NORVASC) 10 MG tablet Take 10 mg by mouth daily.     Yes Historical Provider, MD  aspirin 81 MG chewable tablet Chew 81 mg by mouth daily. 11/01/11 10/31/12 Yes Clydia Llano, MD  calcium acetate (PHOSLO) 667 MG capsule Take 667 mg by mouth 3 (three) times daily with meals. 11/01/11 10/31/12 Yes Clydia Llano, MD  cloNIDine (CATAPRES) 0.2 MG tablet Take 0.2 mg by mouth 3 (three) times daily.     Yes Historical Provider, MD  darbepoetin (ARANESP) 60 MCG/0.3ML SOLN Inject 60 mcg into the vein every Friday with hemodialysis. 02/14/12  Yes Marinda Elk, MD  insulin glargine (LANTUS) 100 UNIT/ML injection Inject 18 Units into the skin at bedtime. 11/01/11  Yes Clydia Llano, MD  isosorbide mononitrate (IMDUR) 120 MG 24 hr tablet Take 120 mg by mouth daily.     Yes Historical Provider, MD  levothyroxine (SYNTHROID, LEVOTHROID) 25 MCG tablet Take 25 mcg by mouth daily.     Yes Historical Provider, MD  metoprolol tartrate (LOPRESSOR) 25 MG tablet Take 25 mg by mouth 2 (two) times daily. 02/14/12 02/13/13 Yes Marinda Elk, MD    pantoprazole (PROTONIX) 40 MG tablet Take 40 mg by mouth daily.     Yes Historical Provider, MD  Probiotic Product (PROBIOTIC PO) Take 1 capsule by mouth daily.   Yes Historical Provider, MD  simvastatin (ZOCOR) 10 MG tablet Take 20 mg by mouth daily at 6 PM. 02/14/12 02/13/13 Yes Marinda Elk, MD  sodium chloride 0.9 % SOLN 100 mL with ferric gluconate 12.5 MG/ML SOLN 125 mg Inject 62.5 mg into the vein every Wednesday with hemodialysis. 02/14/12  Yes Marinda Elk, MD  warfarin (COUMADIN) 2 MG tablet Take 3 mg by mouth daily. 02/14/12  Yes Marinda Elk, MD  nitroGLYCERIN (NITROSTAT) 0.4 MG SL tablet Place 0.4 mg under the tongue every 5 (five) minutes as needed. For chest pain.     Historical Provider, MD  oxyCODONE-acetaminophen (PERCOCET) 5-325 MG per tablet Take 1 tablet by mouth every 6 (six) hours as needed. For pain.    Historical Provider, MD   Current Facility-Administered Medications  Medication Dose Route Frequency Provider Last Rate Last Dose  . ipratropium (ATROVENT) nebulizer solution 0.5 mg  0.5 mg Nebulization Q4H PRN Debby Crosley, MD       And  . albuterol (PROVENTIL) (5 MG/ML) 0.5% nebulizer solution 2.5 mg  2.5 mg Nebulization Q4H PRN Debby Crosley, MD      . alum & mag hydroxide-simeth (MAALOX/MYLANTA) 200-200-20 MG/5ML suspension 30 mL  30 mL Oral Q6H PRN Debby Crosley, MD      . amLODipine (NORVASC) tablet 10 mg  10 mg Oral Daily Debby Crosley, MD   10 mg at 02/24/12 1054  . aspirin chewable tablet 81 mg  81 mg Oral Daily Debby Crosley, MD   81 mg at 02/24/12 1054  . azithromycin (ZITHROMAX) 500 mg in dextrose 5 % 250 mL IVPB  500 mg Intravenous Q24H Debby Crosley, MD      . benzonatate (TESSALON) capsule 100 mg  100 mg Oral TID PRN Debby Crosley, MD      . calcium acetate (PHOSLO) capsule 667 mg  667 mg Oral TID WC Debby Crosley, MD   667 mg at 02/24/12 1054  . cloNIDine (CATAPRES) tablet 0.2 mg  0.2 mg Oral TID Gery Pray, MD   0.2 mg at 02/24/12 1054   . ertapenem (INVANZ) 0.5 g in sodium chloride 0.9 % 50 mL IVPB  500 mg Intravenous QHS Rhetta Mura, MD      . ertapenem (INVANZ) 1 g in sodium chloride 0.9 % 50 mL IVPB  1 g Intravenous Once Rhetta Mura, MD      . furosemide (LASIX) injection 80 mg  80 mg Intravenous Once Debby Crosley, MD   80 mg at 02/24/12 0142  . insulin aspart (novoLOG) injection 0-15 Units  0-15 Units Subcutaneous TID WC Debby Crosley, MD      . insulin  aspart (novoLOG) injection 0-5 Units  0-5 Units Subcutaneous QHS Debby Crosley, MD      . insulin glargine (LANTUS) injection 18 Units  18 Units Subcutaneous QHS Debby Crosley, MD      . isosorbide mononitrate (IMDUR) 24 hr tablet 120 mg  120 mg Oral Daily Debby Crosley, MD   120 mg at 02/24/12 1054  . levothyroxine (SYNTHROID, LEVOTHROID) tablet 25 mcg  25 mcg Oral QAC breakfast Debby Crosley, MD   25 mcg at 02/24/12 0830  . metoprolol (LOPRESSOR) tablet 50 mg  50 mg Oral BID Rhetta Mura, MD      . nitroGLYCERIN (NITROSTAT) SL tablet 0.4 mg  0.4 mg Sublingual Q5 min PRN Debby Crosley, MD      . ondansetron (ZOFRAN) tablet 4 mg  4 mg Oral Q6H PRN Debby Crosley, MD       Or  . ondansetron (ZOFRAN) injection 4 mg  4 mg Intravenous Q6H PRN Debby Crosley, MD      . oxyCODONE-acetaminophen (PERCOCET) 5-325 MG per tablet 1 tablet  1 tablet Oral Q6H PRN Debby Crosley, MD      . pantoprazole (PROTONIX) EC tablet 40 mg  40 mg Oral Q1200 Debby Crosley, MD      . simvastatin (ZOCOR) tablet 20 mg  20 mg Oral q1800 Debby Crosley, MD      . sodium chloride 0.9 % injection 3 mL  3 mL Intravenous Q12H Debby Crosley, MD   3 mL at 02/24/12 0146  . sodium chloride 0.9 % injection 3 mL  3 mL Intravenous PRN Debby Crosley, MD      . vancomycin (VANCOCIN) 1,250 mg in sodium chloride 0.9 % 250 mL IVPB  1,250 mg Intravenous Once Rhetta Mura, MD      . vancomycin (VANCOCIN) 500 mg in sodium chloride 0.9 % 100 mL IVPB  500 mg Intravenous Q M,W,F-HD Rhetta Mura, MD      . warfarin (COUMADIN) tablet 4 mg  4 mg Oral ONCE-1800 Rhetta Mura, MD      . Warfarin - Pharmacist Dosing Inpatient   Does not apply [id number] Rhetta Mura, MD       Labs: Basic Metabolic Panel:  Lab 02/24/12 6045 02/24/12 0100  NA 133* 132*  K 5.5* 3.7  CL 90* 91*  CO2 18* 25  GLUCOSE 279* 229*  BUN 21 16  CREATININE 2.75* 2.53*  CALCIUM 9.3 9.1  ALB -- --  PHOS -- --   Liver Function Tests:  Lab 02/24/12 0100  AST 18  ALT 9  ALKPHOS 87  BILITOT 0.2*  PROT 7.1  ALBUMIN 3.2*  CBC:  Lab 02/24/12 0640 02/24/12 0100  WBC 4.3 4.4  NEUTROABS -- --  HGB 13.6 11.6*  HCT 43.4 37.5  MCV 92.1 93.3  PLT 259 277  CBG:  Lab 02/24/12 1154 02/23/12 2135  GLUCAP 523* 177*   Lab Results  Component Value Date   INR 2.01* 02/24/2012   INR 1.10 02/13/2012   INR 2.09* 02/12/2012   Studies/Results: Dg Chest 2 View  02/24/2012  *RADIOLOGY REPORT*  Clinical Data: Shortness of breath  CHEST - 2 VIEW  Comparison: CT same date and chest radiograph same date  Findings: Dual lumen right IJ approach hemodialysis catheter tips terminate over the distal SVC and cavoatrial junction, respectively.  Heart size is normal.  Patchy nodular right upper lobe opacity overlies the right posterior fourth rib, better seen on dissimilar prior exam ordered day.  Hyperinflation again suggests COPD.  Trace effusions are again noted.  No compression fracture.  Mild central bronchial wall thickening again noted.  IMPRESSION: No significant change from exam prior today.  Stable findings of COPD with probable superimposed bronchitis and nonspecific right upper lobe nodularity.  Follow-up to radiographic resolution is recommended in 4-6 weeks to document resolution of presumed pneumonia and to exclude underlying nodule.  Original Report Authenticated By: Harrel Lemon, M.D.   ROS: As per HPI otherwise neg.  Physical Exam: Filed Vitals:   02/23/12 2145 02/24/12 0143 02/24/12 0536  02/24/12 0945  BP: 170/73 153/76 168/80 151/76  Pulse: 103 101 97 84  Temp: 98.9 F (37.2 C)  98.5 F (36.9 C) 97 F (36.1 C)  TempSrc: Oral  Oral Oral  Resp: 18  18 20   Height: 5\' 5"  (1.651 m)     Weight: 58.5 kg (128 lb 15.5 oz)     SpO2: 90%  95% 95%     General: Thin, pale, ill appearing. Head: Normocephalic, atraumatic, sclera non-icteric, mucus membranes are moist, edentulous Neck: Supple. JVD not elevated. Lungs: Marked diminished BS bilaterally;  Breathing is unlabored. Heart: RRR with S1 S2. No murmurs, rubs, or gallops appreciated. Abdomen: Soft, non-tender, non-distended with normoactive bowel sounds. No rebound/guarding. No obvious abdominal masses. M-S:  Muscle wasting bilaterally\ Lower extremities: without edema , no open wounds  Neuro: Alert and oriented X 3. Moves all extremities spontaneously. Psych:  Responds to questions appropriately with a normal affect. Dialysis Access: maturing right upper AVF + thrill and bruit with fading bruise (prior infiltration); right I-J catheter  Assessment/Plan 1. Recurrent PNA vs bronchitis in COPD pt who quit smoking 6 months ago; on azithromycin IV, Vanc and Invanz; mininebs;   2. ESRD -  MWF [place] per routine K 5.5 BUT specimen hemolyzed. 3. Recurrent E coli ESBL+ UTI  4/19 (also ESBL E Coli UTI 3/22) - culture results had just returned; not started on antibioitics PTA; it is sensitive to meropenem (she is on Invanz); a copy of the culture results will be placed in her shadow chart. (She had been scheduled for an appt 4/25 with Dr. Albin Felling, Urology, [facility]) 4. Hypertension/volume  -BP elevated much more so than outpt dialysis; post HD wt yesterday was 1.4 above EDW with BP of 128/58 sitting; on clonidine 0.2 tid, norvasc 10, metoprolol 25 vs 50 bid 5. Anemia  - HGB markedly greater than outpt; was 9.8 4/17 and 11.6 and 13 here; hold ESA and follow 6. Metabolic bone disease -  Continue pholso and 1 mcg zemplar q hd; check P pre HD  Wed. 7. Nutrition - high protein renal diet; carb mod 8. Diabetes-  BS elevated per primary Lantus 18 + SSI 9. Recent NSTEMI 4/11 - imdur 120, metoprolol 50 bid, (was 25 bid at last d/c), baby asa 10. Hx PE - on coumadin since 10/2011 - INR therapeutic  Sheffield Slider, PA-C [facility] Beeper [phone] 02/24/2012, 12:17 PM   Patient seen and examined and agree with assessment and plan as above.  [name]  MD [employer] [phone] pgr    [phone] cell 02/24/2012, 5:50 PM

## 2012-02-24 NOTE — Progress Notes (Signed)
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 Non-ST segment elevation MI 02/04/11-likely secondary to demand ischemia-cardiac cath 4/12 = ? DVT/PE 10/2011 ? Pulmonary hypertension per echo  Consultants:  None so far  Procedures:  Chest x-ray 4/21 = no significant change from prior exam, his findings COPD with probable superimposed bronchitis/nonspecific right upper lobe nodularity  Antibiotics:  Vancomycin M-W-F with hemodialysis 4/24  Ertapenem 02/24/12  Azithromycin 500 every 24 4/23   Subjective  Feels fair.  No chest pain, no shortness of breath out of the ordinary, no fever. States she might have a little bit of burning in urine. No diarrhea. Appetite good   Objective    Interim History: Entire chart reviewed   Objective: Filed Vitals:   02/23/12 2145 02/24/12 0143 02/24/12 0536  BP: 170/73 153/76 168/80  Pulse: 103 101 97  Temp: 98.9 F (37.2 C)  98.5 F (36.9 C)  TempSrc: Oral  Oral  Resp: 18  18  Height: 5\' 5"  (1.651 m)    Weight: 58.5 kg (128 lb 15.5 oz)    SpO2: 90%  95%    Intake/Output Summary (Last 24 hours) at 02/24/12 1009 Last data filed at 02/24/12 0900  Gross per 24 hour  Intake    840 ml  Output      0 ml  Net    840 ml    Exam:  General: Alert frail location female some bruising over skin, poor dentition, no pallor no  icterus Cardiovascular: S1-S2 slightly tachycardic no murmur, no rub no gallop, no JVD or carotid bruit Respiratory: Clinically clear, no tactile vocal resonance and fremitus Abdomen: Soft nontender nondistended Skin grossly intact with some bruising over skin Neuro grossly intact  Data Reviewed: Basic Metabolic Panel:  Lab 02/24/12 6295 02/24/12 0100  NA 133* 132*  K 5.5* 3.7  CL 90* 91*  CO2 18* 25  GLUCOSE 279* 229*  BUN 21 16  CREATININE 2.75* 2.53*  CALCIUM 9.3 9.1  MG -- --  PHOS -- --   Liver Function Tests:  Lab 02/24/12 0100  AST 18  ALT 9  ALKPHOS 87  BILITOT 0.2*  PROT 7.1  ALBUMIN 3.2*   No results found for this basename: LIPASE:5,AMYLASE:5 in the last 168 hours No results found for this basename: AMMONIA:5 in the last 168 hours CBC:  Lab 02/24/12 0640 02/24/12 0100  WBC 4.3 4.4  NEUTROABS -- --  HGB 13.6 11.6*  HCT 43.4 37.5  MCV 92.1 93.3  PLT 259 277   Cardiac Enzymes: No results found for this basename: CKTOTAL:5,CKMB:5,CKMBINDEX:5,TROPONINI:5 in the last 168 hours BNP: No components found with this basename: POCBNP:5 CBG:  Lab 02/23/12 2135  GLUCAP 177*    No results found for this or any previous visit (from the past 240 hour(s)).   Studies:              All Imaging reviewed and is as per above notation   Scheduled  Meds:   . amLODipine  10 mg Oral Daily  . aspirin  81 mg Oral Daily  . azithromycin  500 mg Intravenous Q24H  . calcium acetate  667 mg Oral TID WC  . cloNIDine  0.2 mg Oral TID  . ertapenem  500 mg Intravenous QHS  . ertapenem  1 g Intravenous Once  . furosemide  80 mg Intravenous Once  . insulin aspart  0-15 Units Subcutaneous TID WC  . insulin aspart  0-5 Units Subcutaneous QHS  . insulin glargine  18 Units Subcutaneous QHS  . isosorbide mononitrate  120 mg Oral Daily  . levothyroxine  25 mcg Oral QAC breakfast  . metoprolol tartrate  25 mg Oral BID  . pantoprazole  40 mg Oral Q1200  . simvastatin  20 mg Oral  q1800  . sodium chloride  3 mL Intravenous Q12H  . vancomycin  1,250 mg Intravenous Once  . vancomycin  500 mg Intravenous Q M,W,F-HD  . warfarin  4 mg Oral ONCE-1800  . Warfarin - Pharmacist Dosing Inpatient   Does not apply q1800  . DISCONTD: azithromycin  500 mg Intravenous Once  . DISCONTD: cefTRIAXone (ROCEPHIN)  IV  1 g Intravenous QHS  . DISCONTD: ertapenem  1 g Intravenous QHS  . DISCONTD: heparin  5,000 Units Subcutaneous Q8H  . DISCONTD: warfarin  3 mg Oral Daily   Continuous Infusions:    Assessment/Plan: 1. ESBL UTI-given her multiple episodes of this and multiple hospitalizations with this we will ask infectious disease to with regards to this as she had an ESBL UTI above 100,000 colony-forming units sensitive to ertapenem, gentamicin, Meropenem, nitrofurantoin, Tigeciline, Amikacin and, cefotetan-likely patient will require long-term therapy with this 10-14 days and probably need PICC line placement. Attention to transition to gentamicin as this can be given with dialysis. 2. ? Pneumonia-chest x-ray non-suggestive for pneumonia as per above.  Continue vancomycin for now. We'll await cultures prior to discontinuation of the same 3. History COPD oxygen dependent-usually use 3 L of oxygen at home. Continue Atrovent 0.5 every 4 when necessary albuterol 2.5 every 4 when necessary.-Doesn't have a wheeze at present time would hold on IV or even by mouth steroids. 4. Hypertension-only fairly to moderately controlled. On amlodipine 10 mg daily, clonidine 0.2 mg 3 times a day, Imdur 120 mg daily, metoprolol 25 mg twice a day.  Could increase clonidine/metoprolol dose. She is slightly tachycardic we'll increase her metoprolol. 5. Sinus tachycardia-likely secondary to volume issues versus? sepsis. Increase metoprolol to 50 mg twice a day 6. History DVT/PE 10/2011 continue Coumadin. INR therapeutic at 2.0  7. Diabetes mellitus-blood sugar ranging between 279 and 177. Continue Lantus 18 units  each bedtime. Continue moderate slight scale coverage with at bedtime coverage as well 8. CAD with recent cardiac cath and non-ST MI-medical management only. Nontender Plavix given on Coumadin. 9. blood counts actually pretty good today. No need at present time for Erythrogenic factors 10. Hypothyroidism-last TSH 12 9 = 1.64 continue levothyroxine 25 mcg. Repeat TSH when normal steady state 11. Hyperlipidemia-continue Zocor 20 mg daily 12. Hyperkalemia -Patient had hemolyzed blood sample and I do not think that this is a real finding  Code Status: Full  Family communication: None in room Disposition Plan: Inpatient   Pleas Koch, MD  [facility] Pager [phone] 02/24/2012, 10:09 AM    LOS: 1 day     and

## 2012-02-24 NOTE — Progress Notes (Addendum)
Results for AVANELLE, PIXLEY (MRN [id number]) as of 02/24/2012 17:03  Ref. Range 02/24/2012 11:54 02/24/2012 12:29  Glucose-Capillary Latest Range: 70-99 mg/dL 409 (H) 811 (H)    Patient refused Lantus last night.  As a result her CBGs have been extremely elevated today.  Spoke with Midge Aver, RN caring for patient today.  Advised RN to call Dr. Mahala Menghini if pre-supper CBG >400 mg/dl.  Recommend we go ahead and give evening Lantus dose early if pre-supper CBG >400 mg/dl.   Will follow. Ambrose Finland RN, MSN, CDE Diabetes Coordinator Inpatient Diabetes Program [phone]

## 2012-02-24 NOTE — Progress Notes (Signed)
ANTIBIOTIC CONSULT NOTE - INITIAL  Pharmacy Consult for Vancomycin    Indication: rule out pneumonia  Allergies  Allergen Reactions  . Novocain Other (See Comments)    Unknown    Patient Measurements: Height: 5\' 5"  (165.1 cm) Weight: 128 lb 15.5 oz (58.5 kg) IBW/kg (Calculated) : 57   Vital Signs: Temp: 98.9 F (37.2 C) (04/22 2145) Temp src: Oral (04/22 2145) BP: 153/76 mmHg (04/23 0143) Pulse Rate: 101  (04/23 0143) Intake/Output from previous day: 04/22 0701 - 04/23 0700 In: 240 [P.O.:240] Out: -  Intake/Output from this shift: Total I/O In: 240 [P.O.:240] Out: -   Labs:  Basename 02/24/12 0100  WBC 4.4  HGB 11.6*  PLT 277  LABCREA --  CREATININE 2.53*   Estimated Creatinine Clearance: 19.2 ml/min (by C-G formula based on Cr of 2.53).  Assessment: 71 yo female with PNA for empiric antibiotics  Goal of Therapy:  Vancomycin pre-HD level 15-25  Plan:  Vancomycin 1250 mg IV now, then 500 mg IV after each HD   Amedee Cerrone, Gary Fleet 02/24/2012,5:26 AM

## 2012-02-24 NOTE — Progress Notes (Signed)
Utilization review completed. Aydian Dimmick RN, CCM  

## 2012-02-24 NOTE — H&P (Signed)
PCP:   Quentin Mulling, MD, MD  Nephrology: [facility] Cardiologist: [facility]  Chief Complaint:  Shortness of breath  HPI: This is a 71 year old female was appropriate hemodialysis today when they noted that she had rhonchi. She was sent to the ER, where chest x-rays were done revealing pneumonia. A request was made for transfer to [facility]. Patient accepted by a daytime physician. Patient's a poor historian, most history obtained from below medical records. Patient denies any shortness of breath. She states she did have a cough that was essentially nonproductive.  Review of Systems: Positives bolded   The patient denies anorexia, fever, weight loss,, vision loss, decreased hearing, hoarseness, chest pain, syncope, dyspnea on exertion, peripheral edema, balance deficits, hemoptysis, abdominal pain, melena, hematochezia, severe indigestion/heartburn, hematuria, incontinence, genital sores, muscle weakness, suspicious skin lesions, transient blindness, difficulty walking, depression, unusual weight change, abnormal bleeding, enlarged lymph nodes, angioedema, and breast masses.  Past Medical History: Past Medical History  Diagnosis Date  . Diabetes mellitus   . Hypercholesterolemia   . Hypothyroidism   . Colitis   . Chronic kidney disease   . Coronary artery disease   . Hypertension   . GERD (gastroesophageal reflux disease)   . Myocardial infarction   . Peripheral vascular disease   . COPD (chronic obstructive pulmonary disease)   . Pneumonia   . Angina   . Shortness of breath    Past Surgical History  Procedure Date  . Tubal ligation   . Amputaton of right fifth toe   . Tonsillectomy   . Insertion of dialysis catheter 10/23/2011    Procedure: INSERTION OF DIALYSIS CATHETER;  Surgeon: Chuck Hint, MD;  Location: [facility] OR;  Service: Vascular;  Laterality: Right;  Right internal jugular Insertion diatek catheter    Medications: Prior to Admission  medications   Medication Sig Start Date End Date Taking? Authorizing Provider  albuterol (PROVENTIL) (5 MG/ML) 0.5% nebulizer solution Take 2.5 mg by nebulization every 4 (four) hours as needed. For wheezing/shortness of breath. 02/10/12 02/09/13 Yes Kathlen Mody, MD  amLODipine (NORVASC) 10 MG tablet Take 10 mg by mouth daily.     Yes Historical Provider, MD  aspirin 81 MG chewable tablet Chew 81 mg by mouth daily. 11/01/11 10/31/12 Yes Clydia Llano, MD  calcium acetate (PHOSLO) 667 MG capsule Take 667 mg by mouth 3 (three) times daily with meals. 11/01/11 10/31/12 Yes Clydia Llano, MD  cloNIDine (CATAPRES) 0.2 MG tablet Take 0.2 mg by mouth 3 (three) times daily.     Yes Historical Provider, MD  darbepoetin (ARANESP) 60 MCG/0.3ML SOLN Inject 60 mcg into the vein every Friday with hemodialysis. 02/14/12  Yes Marinda Elk, MD  insulin glargine (LANTUS) 100 UNIT/ML injection Inject 18 Units into the skin at bedtime. 11/01/11  Yes Clydia Llano, MD  isosorbide mononitrate (IMDUR) 120 MG 24 hr tablet Take 120 mg by mouth daily.     Yes Historical Provider, MD  levothyroxine (SYNTHROID, LEVOTHROID) 25 MCG tablet Take 25 mcg by mouth daily.     Yes Historical Provider, MD  metoprolol tartrate (LOPRESSOR) 25 MG tablet Take 25 mg by mouth 2 (two) times daily. 02/14/12 02/13/13 Yes Marinda Elk, MD  pantoprazole (PROTONIX) 40 MG tablet Take 40 mg by mouth daily.     Yes Historical Provider, MD  Probiotic Product (PROBIOTIC PO) Take 1 capsule by mouth daily.   Yes Historical Provider, MD  simvastatin (ZOCOR) 10 MG tablet Take 20 mg by mouth daily at 6  PM. 02/14/12 02/13/13 Yes Marinda Elk, MD  sodium chloride 0.9 % SOLN 100 mL with ferric gluconate 12.5 MG/ML SOLN 125 mg Inject 62.5 mg into the vein every Wednesday with hemodialysis. 02/14/12  Yes Marinda Elk, MD  warfarin (COUMADIN) 2 MG tablet Take 3 mg by mouth daily. 02/14/12  Yes Marinda Elk, MD  nitroGLYCERIN (NITROSTAT) 0.4  MG SL tablet Place 0.4 mg under the tongue every 5 (five) minutes as needed. For chest pain.     Historical Provider, MD  oxyCODONE-acetaminophen (PERCOCET) 5-325 MG per tablet Take 1 tablet by mouth every 6 (six) hours as needed. For pain.    Historical Provider, MD    Allergies:   Allergies  Allergen Reactions  . Novocain Other (See Comments)    Unknown    Social History:  reports that she quit smoking about 8 months ago. Her smoking use included Cigarettes. She has a 40 pack-year smoking history. She does not have any smokeless tobacco history on file. She reports that she does not drink alcohol or use illicit drugs.  Family History: Family History  Problem Relation Age of Onset  . Diabetes type II    . Hypertension      Physical Exam: Filed Vitals:   02/23/12 2145  BP: 170/73  Pulse: 103  Temp: 98.9 F (37.2 C)  TempSrc: Oral  Resp: 18  Height: 5\' 5"  (1.651 m)  Weight: 58.5 kg (128 lb 15.5 oz)  SpO2: 90%    General:  Alert and oriented times three, well developed and nourished, no acute distress Eyes: PERRLA, pink conjunctiva, no scleral icterus ENT: Moist oral mucosa, neck supple, no thyromegaly Lungs: clear to ascultation, no wheeze, no crackles, no use of accessory muscles Cardiovascular: regular rate and rhythm, no regurgitation, no gallops, no murmurs. No carotid bruits, no JVD Abdomen: soft, positive BS, non-tender, non-distended, no organomegaly, not an acute abdomen GU: not examined Neuro: CN II - XII grossly intact, sensation intact Musculoskeletal: strength 5/5 all extremities, no clubbing, cyanosis or edema Skin: no rash, no subcutaneous crepitation, no decubitus Psych: appropriate patient   Labs on Admission:  No results found for this basename: NA:2,K:2,CL:2,CO2:2,GLUCOSE:2,BUN:2,CREATININE:2,CALCIUM:2,MG:2,PHOS:2 in the last 72 hours No results found for this basename: AST:2,ALT:2,ALKPHOS:2,BILITOT:2,PROT:2,ALBUMIN:2 in the last 72 hours No  results found for this basename: LIPASE:2,AMYLASE:2 in the last 72 hours  Basename 02/24/12 0100  WBC 4.4  NEUTROABS --  HGB 11.6*  HCT 37.5  MCV 93.3  PLT 277   No results found for this basename: CKTOTAL:3,CKMB:3,CKMBINDEX:3,TROPONINI:3 in the last 72 hours No components found with this basename: POCBNP:3 No results found for this basename: DDIMER:2 in the last 72 hours No results found for this basename: HGBA1C:2 in the last 72 hours No results found for this basename: CHOL:2,HDL:2,LDLCALC:2,TRIG:2,CHOLHDL:2,LDLDIRECT:2 in the last 72 hours No results found for this basename: TSH,T4TOTAL,FREET3,T3FREE,THYROIDAB in the last 72 hours No results found for this basename: VITAMINB12:2,FOLATE:2,FERRITIN:2,TIBC:2,IRON:2,RETICCTPCT:2 in the last 72 hours  Micro Results: No results found for this or any previous visit (from the past 240 hour(s)).   Radiological Exams on Admission: Dg Chest 2 View  02/24/2012  *RADIOLOGY REPORT*  Clinical Data: Shortness of breath  CHEST - 2 VIEW  Comparison: CT same date and chest radiograph same date  Findings: Dual lumen right IJ approach hemodialysis catheter tips terminate over the distal SVC and cavoatrial junction, respectively.  Heart size is normal.  Patchy nodular right upper lobe opacity overlies the right posterior fourth rib, better seen on dissimilar  prior exam ordered day.  Hyperinflation again suggests COPD.  Trace effusions are again noted.  No compression fracture.  Mild central bronchial wall thickening again noted.  IMPRESSION: No significant change from exam prior today.  Stable findings of COPD with probable superimposed bronchitis and nonspecific right upper lobe nodularity.  Follow-up to radiographic resolution is recommended in 4-6 weeks to document resolution of presumed pneumonia and to exclude underlying nodule.  Original Report Authenticated By: Harrel Lemon, M.D.    EKG: Normal sinus rhythm with slight depression ST segments in  the lateral and inferior leads. No significant change compared to prior  Assessment/Plan Present on Admission:  . hospital-acquired Pneumonia Admit to telemetry Blood cultures and sputum cultures ordered Antibiotics started, ertapenem used as patient with a recent urine culture 4/22 positive for Escherichia coli sensitive mainly to ertapenem  Duonebs and oxygen ordered  Tessalon Perles when necessary  .CAD (coronary artery disease) Patient with recent MI earlier this month. Treated by [facility] cardiology  .Diabetes mellitus .ESRD (end stage renal disease) on dialysis .History of pulmonary embolus (PE) .HTN (hypertension) .Hypercholesterolemia .Hypothyroidism .Peripheral arterial disease Stable resume home medications ADA diet and sliding scale insulin   Full code DVT prophylaxis T9/Dr. Jake Bathe, [place] 02/24/2012, 1:28 AM

## 2012-02-25 ENCOUNTER — Inpatient Hospital Stay (HOSPITAL_COMMUNITY): Payer: Medicare Other

## 2012-02-25 LAB — CBC
HCT: 33.6 % — ABNORMAL LOW (ref 36.0–46.0)
Hemoglobin: 10.6 g/dL — ABNORMAL LOW (ref 12.0–15.0)
MCH: 29 pg (ref 26.0–34.0)
MCHC: 31.5 g/dL (ref 30.0–36.0)
MCV: 91.8 fL (ref 78.0–100.0)
Platelets: 264 10*3/uL (ref 150–400)
RBC: 3.66 MIL/uL — ABNORMAL LOW (ref 3.87–5.11)
RDW: 21.6 % — ABNORMAL HIGH (ref 11.5–15.5)
WBC: 7.7 10*3/uL (ref 4.0–10.5)

## 2012-02-25 LAB — BASIC METABOLIC PANEL
BUN: 42 mg/dL — ABNORMAL HIGH (ref 6–23)
CO2: 27 mEq/L (ref 19–32)
Calcium: 8.4 mg/dL (ref 8.4–10.5)
Chloride: 90 mEq/L — ABNORMAL LOW (ref 96–112)
Creatinine, Ser: 4.09 mg/dL — ABNORMAL HIGH (ref 0.50–1.10)
GFR calc Af Amer: 12 mL/min — ABNORMAL LOW (ref >90)
GFR calc non Af Amer: 10 mL/min — ABNORMAL LOW (ref >90)
Glucose, Bld: 96 mg/dL (ref 70–99)
Potassium: 3.2 mEq/L — ABNORMAL LOW (ref 3.5–5.1)
Sodium: 132 mEq/L — ABNORMAL LOW (ref 135–145)

## 2012-02-25 LAB — GLUCOSE, CAPILLARY
Glucose-Capillary: 72 mg/dL (ref 70–99)
Glucose-Capillary: 253 mg/dL — ABNORMAL HIGH (ref 70–99)
Glucose-Capillary: 285 mg/dL — ABNORMAL HIGH (ref 70–99)

## 2012-02-25 LAB — CLOSTRIDIUM DIFFICILE BY PCR: Toxigenic C. Difficile by PCR: NEGATIVE

## 2012-02-25 LAB — PROTIME-INR
INR: 2.11 — ABNORMAL HIGH (ref 0.00–1.49)
Prothrombin Time: 24 seconds — ABNORMAL HIGH (ref 11.6–15.2)

## 2012-02-25 MED ORDER — METOPROLOL TARTRATE 50 MG PO TABS
50.0000 mg | ORAL_TABLET | Freq: Two times a day (BID) | ORAL | Status: DC
  Administered 2012-02-26: 50 mg via ORAL
  Filled 2012-02-25 (×4): qty 1

## 2012-02-25 MED ORDER — PARICALCITOL 5 MCG/ML IV SOLN
INTRAVENOUS | Status: AC
  Administered 2012-02-25: 1 ug via INTRAVENOUS
  Filled 2012-02-25: qty 1

## 2012-02-25 MED ORDER — WARFARIN SODIUM 3 MG PO TABS
3.0000 mg | ORAL_TABLET | Freq: Every day | ORAL | Status: DC
  Administered 2012-02-25 – 2012-02-26 (×2): 3 mg via ORAL
  Filled 2012-02-25 (×3): qty 1

## 2012-02-25 MED ORDER — DARBEPOETIN ALFA-POLYSORBATE 100 MCG/0.5ML IJ SOLN
100.0000 ug | INTRAMUSCULAR | Status: DC
  Administered 2012-02-25: 100 ug via INTRAVENOUS
  Filled 2012-02-25: qty 0.5

## 2012-02-25 NOTE — Progress Notes (Signed)
ANTICOAGULATION CONSULT NOTE - Follow Up Consult  Pharmacy Consult for Coumadin Indication: PE / DVT history  Allergies  Allergen Reactions  . Novocain Other (See Comments)    Unknown    Patient Measurements: Height: 5\' 5"  (165.1 cm) Weight: 130 lb 15.3 oz (59.4 kg) IBW/kg (Calculated) : 57   Vital Signs: Temp: 96.7 F (35.9 C) (04/24 0706) Temp src: Oral (04/24 0706) BP: 87/38 mmHg (04/24 0835) Pulse Rate: 73  (04/24 0830)  Labs:  Basename 02/25/12 0748 02/24/12 0640 02/24/12 0100  HGB 10.6* 13.6 --  HCT 33.6* 43.4 37.5  PLT 264 259 277  APTT -- -- --  LABPROT 24.0* -- 23.1*  INR 2.11* -- 2.01*  HEPARINUNFRC -- -- --  CREATININE 4.09* 2.75* 2.53*  CKTOTAL -- -- --  CKMB -- -- --  TROPONINI -- -- --   Estimated Creatinine Clearance: 11.8 ml/min (by C-G formula based on Cr of 4.09).  Assessment: 71 year old on Coumadin for PE/DVT history PTA. INR therapeutic Home dose = 3 mg daily  Goal of Therapy:  INR = 2 to 3   Plan:  1) Coumadin 3 mg po daily at 1800 2) Continue daily INR  Elwin Sleight 02/25/2012,9:01 AM

## 2012-02-25 NOTE — Procedures (Signed)
I was present at this dialysis session. I have reviewed the session itself and made appropriate changes.   [name], MD [employer] 02/25/2012, 10:47 AM

## 2012-02-25 NOTE — Progress Notes (Signed)
[facility] Progress Note  Subjective: Had diarrhea all nite; Abdomen sore; loose cough, but not getting anything up. Objective Filed Vitals:   02/25/12 0805 02/25/12 0830 02/25/12 0835 02/25/12 0900  BP: 78/35 91/44 87/38  92/40  Pulse:  73  75  Temp:      TempSrc:      Resp:  22  19  Height:      Weight:      SpO2:       Physical Exam General: pale, NAD on HD Heart:  RRR Lungs: bilateral rhonchi Abdomen: soft mild lower to mid tenderness on palpation Extremities: no LE edema; upper extremity extensive purpuric bruising (on coumadin) Dialysis Access: right upper AVF patient; using right I-J catheter  Assessment/Plan: 1. Gram + BC 4/23 - on Vancomycin; she has a perm cath and a viable right upper AVF that has been "resting" due to an infiltration; with  Skilled cannulation, I believe it should be tried again; diarrhea likely due to bacteremia or antibiotics - check c.diff just in case. One of 2 BCx's is +, await final ID to determine whether is it likely pathogen or not. 2. Recurrent PNA vs bronchitis in COPD pt who quit smoking 6 months ago; on azithromycin IV, Vanc and Invanz; mininebs;  3. ESRD - MWF [place] per routine K elevated yesterday, but hemoloyzed; today 3.2 needs added K bath 4. Recurrent E coli ESBL+ UTI 4/19 (also ESBL E Coli UTI 3/22) - culture results had just returned; not started on antibioitics PTA; it is sensitive to meropenem (she is on Invanz); a copy of the culture results will be placed in her shadow chart. (She had been scheduled for an appt 4/25 with Dr. Albin Felling, Urology, [facility]) 5. Hypertension/volume  Had some hypotension; low, but better now; BP drop started at 2200. Home meds are clonidine 0.2 tid, norvasc 10, metoprolol 25 vs 50 bid- parameters written to hold; Likely related to sepsis. BNP markedly elevated upon admission 6. Anemia - HGB markedly greater on admission than outpt; was 9.8 4/17 and 11.6 and 13 here;Hgb 10.6 today, resume  outpatient ESA equivalent. 7. Metabolic bone disease - Continue pholso and 1 mcg zemplar q hd; check P pre HD Wed. 8. Nutrition - high protein renal diet; carb mod 9. Diabetes- remains quite hyperglycemic elevated per primary Lantus 18 + SSI 10. Recent NSTEMI 4/11 - imdur 120, metoprolol 50 bid, (was 25 bid at last d/c), baby asa 11. Hx PE - on coumadin since 10/2011 - INR therapeutic   Additional Objective Labs: Basic Metabolic Panel:  Lab 02/25/12 1610 02/24/12 0640 02/24/12 0100  NA 132* 133* 132*  K 3.2* 5.5* 3.7  CL 90* 90* 91*  CO2 27 18* 25  GLUCOSE 96 279* 229*  BUN 42* 21 16  CREATININE 4.09* 2.75* 2.53*  CALCIUM 8.4 9.3 9.1  ALB -- -- --  PHOS -- -- --   Liver Function Tests:  Lab 02/24/12 0100  AST 18  ALT 9  ALKPHOS 87  BILITOT 0.2*  PROT 7.1  ALBUMIN 3.2*   Lab Results  Component Value Date   INR 2.11* 02/25/2012   INR 2.01* 02/24/2012   INR 1.10 02/13/2012    CBC:  Lab 02/25/12 0748 02/24/12 0640 02/24/12 0100  WBC 7.7 4.3 4.4  NEUTROABS -- -- --  HGB 10.6* 13.6 11.6*  HCT 33.6* 43.4 37.5  MCV 91.8 92.1 93.3  PLT 264 259 277   Blood Culture    Component Value Date/Time  SDES BLOOD LEFT HAND 02/24/2012 0100   SPECREQUEST BOTTLES DRAWN AEROBIC ONLY 1CC 02/24/2012 0100   CULT  Value: GRAM POSITIVE COCCI IN CLUSTERS Note: Gram Stain Report Called to,Read Back By and Verified With: C. BOKIANG @0610  ON 02/25/12 BY MCLET 02/24/2012 0100   REPTSTATUS PENDING 02/24/2012 0100   CBG:  Lab 02/24/12 2135 02/24/12 1711 02/24/12 1229 02/24/12 1154 02/23/12 2135  GLUCAP 323* 344* 472* 523* 177*   Studies/Results: Dg Chest 2 View  02/24/2012  *RADIOLOGY REPORT*  Clinical Data: Shortness of breath  CHEST - 2 VIEW  Comparison: CT same date and chest radiograph same date  Findings: Dual lumen right IJ approach hemodialysis catheter tips terminate over the distal SVC and cavoatrial junction, respectively.  Heart size is normal.  Patchy nodular right upper lobe  opacity overlies the right posterior fourth rib, better seen on dissimilar prior exam ordered day.  Hyperinflation again suggests COPD.  Trace effusions are again noted.  No compression fracture.  Mild central bronchial wall thickening again noted.  IMPRESSION: No significant change from exam prior today.  Stable findings of COPD with probable superimposed bronchitis and nonspecific right upper lobe nodularity.  Follow-up to radiographic resolution is recommended in 4-6 weeks to document resolution of presumed pneumonia and to exclude underlying nodule.  Original Report Authenticated By: Harrel Lemon, M.D.   Medications:      . amLODipine  10 mg Oral QHS  . aspirin  81 mg Oral Daily  . azithromycin  500 mg Intravenous Q24H  . calcium acetate  667 mg Oral TID WC  . cloNIDine  0.2 mg Oral TID  . ertapenem  500 mg Intravenous QHS  . ertapenem  1 g Intravenous Once  . feeding supplement (NEPRO CARB STEADY)  237 mL Oral TID WC  . insulin aspart  0-15 Units Subcutaneous TID WC  . insulin aspart  0-5 Units Subcutaneous QHS  . insulin glargine  18 Units Subcutaneous QHS  . isosorbide mononitrate  120 mg Oral Daily  . levothyroxine  25 mcg Oral QAC breakfast  . metoprolol tartrate  50 mg Oral BID  . pantoprazole  40 mg Oral Q1200  . paricalcitol  1 mcg Intravenous Q M,W,F-HD  . simvastatin  20 mg Oral q1800  . sodium chloride  3 mL Intravenous Q12H  . vancomycin  1,250 mg Intravenous Once  . vancomycin  500 mg Intravenous Q M,W,F-HD  . warfarin  3 mg Oral q1800  . warfarin  4 mg Oral ONCE-1800  . Warfarin - Pharmacist Dosing Inpatient   Does not apply q1800  . DISCONTD: amLODipine  10 mg Oral Daily  . DISCONTD: metoprolol tartrate  25 mg Oral BID    I  have reviewed scheduled and prn medications.  Sheffield Slider, PA-C [facility] [phone]  02/25/2012,9:24 AM  LOS: 2 days   Patient seen and examined and agree with assessment and plan as above with additions  in bold.   [name]  MD [employer] [phone] pgr    [phone] cell 02/25/2012, 10:47 AM

## 2012-02-25 NOTE — Progress Notes (Signed)
Patient has a low BP of 80/40 via doppler HR 67.Patient asymptomatic.K. Kirby,NP notified.MD made aware clonidine and metoprolol not given.Per MD,recheck BP in an hour and notify MD.Will continue to monitor. [name] Dam Joselita,RN

## 2012-02-25 NOTE — Progress Notes (Signed)
Subjective: patient seen and examined during HD today. informs her breathing t be better, still has dry cough. Blood cx sent from 4/23 growing GPC in clusters.   Objective:  Vital signs in last 24 hours:  Filed Vitals:   02/25/12 1005 02/25/12 1010 02/25/12 1030 02/25/12 1104  BP: 87/37 84/36 94/48  115/52  Pulse:    79  Temp:    97.1 F (36.2 C)  TempSrc:    Oral  Resp:    22  Height:      Weight:    59 kg (130 lb 1.1 oz)  SpO2:        Intake/Output from previous day:   Intake/Output Summary (Last 24 hours) at 02/25/12 1300 Last data filed at 02/25/12 1104  Gross per 24 hour  Intake    780 ml  Output    820 ml  Net    -40 ml    Physical Exam:  General elderly female  in no acute distress. HEENT: no pallor, no icterus, moist oral mucosa, no JVD, no lymphadenopathy Heart: Normal  s1 &s2  Regular rate and rhythm, without murmurs, rubs, gallops. Lungs: Coarse breath sounds b/l, rt sided permcath Abdomen: Soft, nontender, nondistended, positive bowel sounds. Extremities: No clubbing cyanosis or edema with positive pedal pulses. Neuro: Alert, awake, oriented x3, nonfocal.   Lab Results:  Basic Metabolic Panel:    Component Value Date/Time   NA 132* 02/25/2012 0748   K 3.2* 02/25/2012 0748   CL 90* 02/25/2012 0748   CO2 27 02/25/2012 0748   BUN 42* 02/25/2012 0748   CREATININE 4.09* 02/25/2012 0748   GLUCOSE 96 02/25/2012 0748   CALCIUM 8.4 02/25/2012 0748   CBC:    Component Value Date/Time   WBC 7.7 02/25/2012 0748   HGB 10.6* 02/25/2012 0748   HCT 33.6* 02/25/2012 0748   PLT 264 02/25/2012 0748   MCV 91.8 02/25/2012 0748   NEUTROABS 23.4* 02/06/2012 0407   LYMPHSABS 0.6* 02/06/2012 0407   MONOABS 0.7 02/06/2012 0407   EOSABS 0.0 02/06/2012 0407   BASOSABS 0.1 02/06/2012 0407    Recent Results (from the past 240 hour(s))  CULTURE, BLOOD (ROUTINE X 2)     Status: Normal (Preliminary result)   Collection Time   02/24/12 12:35 AM      Component Value Range Status Comment   Specimen Description BLOOD LEFT ARM   Final    Special Requests BOTTLES DRAWN AEROBIC ONLY 3CC   Final    Culture  Setup Time [id number]   Final    Culture     Final    Value:        BLOOD CULTURE RECEIVED NO GROWTH TO DATE CULTURE WILL BE HELD FOR 5 DAYS BEFORE ISSUING A FINAL NEGATIVE REPORT   Report Status PENDING   Incomplete   CULTURE, BLOOD (ROUTINE X 2)     Status: Normal (Preliminary result)   Collection Time   02/24/12  1:00 AM      Component Value Range Status Comment   Specimen Description BLOOD LEFT HAND   Final    Special Requests BOTTLES DRAWN AEROBIC ONLY 1CC   Final    Culture  Setup Time [id number]   Final    Culture     Final    Value: GRAM POSITIVE COCCI IN CLUSTERS     Note: Gram Stain Report Called to,Read Back By and Verified With: Jonette Mate @0610  ON 02/25/12 BY MCLET   Report Status PENDING   Incomplete  Studies/Results: Dg Chest 2 View  02/24/2012  *RADIOLOGY REPORT*  Clinical Data: Shortness of breath  CHEST - 2 VIEW  Comparison: CT same date and chest radiograph same date  Findings: Dual lumen right IJ approach hemodialysis catheter tips terminate over the distal SVC and cavoatrial junction, respectively.  Heart size is normal.  Patchy nodular right upper lobe opacity overlies the right posterior fourth rib, better seen on dissimilar prior exam ordered day.  Hyperinflation again suggests COPD.  Trace effusions are again noted.  No compression fracture.  Mild central bronchial wall thickening again noted.  IMPRESSION: No significant change from exam prior today.  Stable findings of COPD with probable superimposed bronchitis and nonspecific right upper lobe nodularity.  Follow-up to radiographic resolution is recommended in 4-6 weeks to document resolution of presumed pneumonia and to exclude underlying nodule.  Original Report Authenticated By: Harrel Lemon, M.D.    Medications: Scheduled Meds:   . amLODipine  10 mg Oral QHS  . aspirin  81 mg Oral  Daily  . azithromycin  500 mg Intravenous Q24H  . calcium acetate  667 mg Oral TID WC  . cloNIDine  0.2 mg Oral TID  . darbepoetin (ARANESP) injection - DIALYSIS  100 mcg Intravenous Q Wed-HD  . ertapenem  500 mg Intravenous QHS  . ertapenem  1 g Intravenous Once  . feeding supplement (NEPRO CARB STEADY)  237 mL Oral TID WC  . insulin aspart  0-15 Units Subcutaneous TID WC  . insulin aspart  0-5 Units Subcutaneous QHS  . insulin glargine  18 Units Subcutaneous QHS  . isosorbide mononitrate  120 mg Oral Daily  . levothyroxine  25 mcg Oral QAC breakfast  . metoprolol tartrate  50 mg Oral BID  . pantoprazole  40 mg Oral Q1200  . paricalcitol  1 mcg Intravenous Q M,W,F-HD  . simvastatin  20 mg Oral q1800  . sodium chloride  3 mL Intravenous Q12H  . vancomycin  1,250 mg Intravenous Once  . vancomycin  500 mg Intravenous Q M,W,F-HD  . warfarin  3 mg Oral q1800  . warfarin  4 mg Oral ONCE-1800  . Warfarin - Pharmacist Dosing Inpatient   Does not apply q1800  . DISCONTD: metoprolol tartrate  50 mg Oral BID   Continuous Infusions:  PRN Meds:.sodium chloride, sodium chloride, acetaminophen, acetaminophen, albuterol, alteplase, benzonatate, calcium carbonate (dosed in mg elemental calcium), camphor-menthol, docusate sodium, feeding supplement (NEPRO CARB STEADY), heparin, heparin, hydrOXYzine, ipratropium, lidocaine, lidocaine-prilocaine, nitroGLYCERIN, ondansetron (ZOFRAN) IV, ondansetron, oxyCODONE-acetaminophen, pentafluoroprop-tetrafluoroeth, sorbitol, zolpidem  Assessment/Plan: 71 y/o female with hx of DM, COPD, CAD ESRD on HD, recent NSTEMI s/p cath, CHF, HTN, anemia admitted with pneumonia and recurrent UTI with ESBL and now bacteremia with blood cx growing GPC.     GPC Bacteremia  blood cx growing GPC ( sent on 4/23)  no clear source, PNA vs recurrent UTI Afebrile for past 24 hrs  will resend UA, urine cx   ?Health care associated Pneumonia chest x-ray not clearly suggestive for  pneumonia. However now has positive blood cx with GPC ( from 4/23)  Continue vancomycin for now with HD. Cont ertapenem( day 3 ) and zithromax. Await final cx and sensitivity Cont duonebs and mucinex  ESBL UTI multiple episodes with  multiple hospitalizations . Last cx from 3/25 and appears patient completed abx course with meropenem Will discontinue further treatment for this if UA negative   COPD on home 02: on 3 L of oxygen at home. Continue nebs  prn  Hx of DVT/ PE on coumadin  INR therapeutic     Diabetes mellitus Cont lantus 18 u at bedtime Cont SSI   CAD with recent NSTEMI  Medical managed Cont ASA  and zocor  CHF  euvolemic   Hypertension  controlled at present. BP actually on lower side  continue current meds. Reduce dose if continues to be low    Hypothyroidism Cont synthroid    ESRD (end stage renal disease) on dialysis Cont as scheduled  DVT prophylaxis' on coumadin  Full code    LOS: 2 days   Anorah Trias 02/25/2012, 1:00 PM

## 2012-02-25 NOTE — Progress Notes (Signed)
Physical Therapy Evaluation Patient Details Name: [name] [name] MRN: [id number] DOB: 11-Jun-1941 Today's Date: 02/25/2012 Time: 4098-1191 PT Time Calculation (min): 15 min  PT Assessment / Plan / Recommendation Clinical Impression  71 yo female admitted with pneumonia (with difficult to control diarrhea on PT eval) presents to PT with some decreased functional mobility; will benefit from PT services to maximize independence and safety with mobility and acitvity tolerance to enable safe dc home    PT Assessment  Patient needs continued PT services    Follow Up Recommendations  Home health PT    Equipment Recommendations  None recommended by PT    Frequency Min 3X/week    Precautions / Restrictions Precautions Precautions: Fall Precaution Comments: Pt is normally on 2 liters O2 at home Restrictions Weight Bearing Restrictions: No   Pertinent Vitals/Pain No acute distress; session conducted on 2liters supplemental O2      Mobility  Bed Mobility Bed Mobility: Supine to Sit;Sitting - Scoot to [place] of Bed;Sit to Supine Supine to Sit: 4: Min guard Sitting - Scoot to [place] of Bed: 5: Supervision Sit to Supine: 5: Supervision Details for Bed Mobility Assistance: Cues for self-monitor for activity tolerance Transfers Transfers: Sit to Stand;Stand to Sit Sit to Stand: 5: Supervision;From bed Stand to Sit: 4: Min guard;To bed Details for Transfer Assistance: cues for control, sel-monitor, safety Ambulation/Gait Ambulation/Gait Assistance: 4: Min guard Ambulation Distance (Feet): 1 Feet Assistive device: 1 person hand held assist Ambulation/Gait Assistance Details: March in place at [facility] and sidesteps to [place]; Pt opted not to walk much because of diarrhea         PT Goals Acute Rehab PT Goals PT Goal Formulation: With patient Time For Goal Achievement: 03/10/12 Potential to Achieve Goals: Good Pt will go Supine/Side to Sit: Independently PT Goal: Supine/Side to Sit - Progress: Goal  set today Pt will go Sit to Supine/Side: Independently PT Goal: Sit to Supine/Side - Progress: Goal set today Pt will go Stand to Sit: Independently PT Goal: Stand to Sit - Progress: Goal set today Pt will Ambulate: 1 - 15 feet PT Goal: Ambulate - Progress: Goal set today  Visit Information  Last PT Received On: 02/25/12 Assistance Needed: +1    Subjective Data  Subjective: Mostly concerned about diarrhea Patient Stated Goal: get better and go home   Prior Functioning  Home Living Lives With: Son Available Help at Discharge: Family;Available PRN/intermittently Type of Home: House Home Access: Ramped entrance Home Layout: One level Bathroom Shower/Tub: Health visitor: Standard Bathroom Accessibility: Yes How Accessible: Accessible via walker Home Adaptive Equipment: Bedside commode/3-in-1 Prior Function Level of Independence: Independent Able to Take Stairs?: Reciprically Driving: No Vocation: Retired Musician: No difficulties    Cognition  Overall Cognitive Status: Appears within functional limits for tasks assessed/performed Arousal/Alertness: Awake/alert Orientation Level: Oriented X4 / Intact Behavior During Session: WFL for tasks performed           End of Session PT - End of Session Equipment Utilized During Treatment: Gait belt Activity Tolerance: Patient tolerated treatment well Patient left: in bed;with call bell/phone within reach   [name] [name] [place], [place] [phone]  02/25/2012, 3:57 PM

## 2012-02-25 NOTE — Progress Notes (Signed)
   CARE MANAGEMENT NOTE 02/25/2012  Patient:  [name] [name]   Account Number:  [id number]  Date Initiated:  02/24/2012  Documentation initiated by:  Darlyne Russian  Subjective/Objective Assessment:   Patient admitted with pneumonia     Action/Plan:   progression of care and discharge planning   Anticipated DC Date:  02/27/2012   Anticipated DC Plan:  HOME/SELF CARE         Choice offered to / List presented to:             Status of service:  In process, will continue to follow Medicare Important Message given?   (If response is "NO", the following Medicare IM given date fields will be blank) Date Medicare IM given:   Date Additional Medicare IM given:    Discharge Disposition:    Per UR Regulation:  Reviewed for med. necessity/level of care/duration of stay  If discussed at Long Length of Stay Meetings, dates discussed:    Comments:  02/25/12 [name] Boer, RN, BSN 1245 PT IS A FREQUENT FLYER AND WAS FROM HOME WITH [facility] RN/PT WITH [facility].  WILL F/U ON DC NEEDS  02/24/2012  [address] RN, [place] [phone] Utilization review completed.

## 2012-02-25 NOTE — Progress Notes (Signed)
OT Cancellation Note    Treatment cancelled today due to pt in hemodialysis this am.

## 2012-02-25 NOTE — Progress Notes (Signed)
Patient has (+) Blood culture,aerobic bottle drawn on 4/23 has gram (+) cocci in clusters.Text paged result to Dr. Gonzella Lex. Hera Celaya Joselita,RN

## 2012-02-26 DIAGNOSIS — R7881 Bacteremia: Secondary | ICD-10-CM

## 2012-02-26 LAB — CULTURE, BLOOD (ROUTINE X 2): Culture  Setup Time: 201304230413

## 2012-02-26 LAB — GLUCOSE, CAPILLARY
Glucose-Capillary: 70 mg/dL (ref 70–99)
Glucose-Capillary: 225 mg/dL — ABNORMAL HIGH (ref 70–99)
Glucose-Capillary: 245 mg/dL — ABNORMAL HIGH (ref 70–99)

## 2012-02-26 MED ORDER — ISOSORBIDE MONONITRATE ER 60 MG PO TB24: 60.0000 mg | ORAL_TABLET | Freq: Every day | ORAL | Status: DC

## 2012-02-26 MED ORDER — HEPARIN SODIUM (PORCINE) 1000 UNIT/ML DIALYSIS
20.0000 [IU]/kg | INTRAMUSCULAR | Status: DC | PRN
  Filled 2012-02-26: qty 2

## 2012-02-26 MED ORDER — METOPROLOL TARTRATE 25 MG PO TABS
25.0000 mg | ORAL_TABLET | Freq: Two times a day (BID) | ORAL | Status: DC
  Administered 2012-02-26: 25 mg via ORAL
  Filled 2012-02-26 (×3): qty 1

## 2012-02-26 MED ORDER — ISOSORBIDE MONONITRATE ER 60 MG PO TB24
60.0000 mg | ORAL_TABLET | Freq: Every day | ORAL | Status: DC
  Administered 2012-02-27: 60 mg via ORAL
  Filled 2012-02-26: qty 1

## 2012-02-26 MED ORDER — AZITHROMYCIN 500 MG PO TABS
500.0000 mg | ORAL_TABLET | Freq: Every day | ORAL | Status: DC
  Filled 2012-02-26: qty 1

## 2012-02-26 MED ORDER — AMLODIPINE BESYLATE 2.5 MG PO TABS
2.5000 mg | ORAL_TABLET | Freq: Every day | ORAL | Status: DC
  Administered 2012-02-26: 2.5 mg via ORAL
  Filled 2012-02-26 (×2): qty 1

## 2012-02-26 NOTE — Progress Notes (Signed)
Inpatient Diabetes Program Recommendations  AACE/ADA: New Consensus Statement on Inpatient Glycemic Control (2009)  Target Ranges:  Prepandial:   less than 140 mg/dL      Peak postprandial:   less than 180 mg/dL (1-2 hours)      Critically ill patients:  140 - 180 mg/dL     Results for ANNAELLE, KASEL (MRN [id number]) as of 02/26/2012 13:03  Ref. Range 02/25/2012 12:36 02/25/2012 16:42 02/25/2012 21:15  Glucose-Capillary Latest Range: 70-99 mg/dL 72 409 (H) 811 (H)    Results for KYRA, LAFFEY (MRN [id number]) as of 02/26/2012 13:03  Ref. Range 02/26/2012 07:51 02/26/2012 11:57  Glucose-Capillary Latest Range: 70-99 mg/dL 70 213 (H)    Inpatient Diabetes Program Recommendations Insulin - Meal Coverage: Please consider adding meal coverage- Novolog 3 units tid with meals  Note: Will follow. Ambrose Finland RN, MSN, CDE Diabetes Coordinator Inpatient Diabetes Program [phone]

## 2012-02-26 NOTE — Progress Notes (Signed)
[facility] Progress Note  Subjective: Diarrhea yesterday; none today; feels a little better; still coughing.  Objective Filed Vitals:   02/25/12 1836 02/25/12 2117 02/26/12 0530 02/26/12 1000  BP: 131/64 93/53 118/60 133/68  Pulse: 76 81 81 82  Temp: 98.3 F (36.8 C) 98.3 F (36.8 C) 98.4 F (36.9 C) 97.6 F (36.4 C)  TempSrc: Oral Oral Oral Oral  Resp: 20 20 20 20   Height:  5\' 5"  (1.651 m)    Weight:  59 kg (130 lb 1.1 oz)    SpO2: 98% 93% 91% 94%   Physical Exam General: pale but looks a little better Heart: RRR  Lungs:right lung rhonchi; left side mostly clear  Abdomen: soft mild lower to mid tenderness on palpation  Extremities: no LE edema; upper extremity extensive purpuric bruising (on coumadin)  Dialysis Access: right upper AVF patient; using right I-J catheter  Dialysis Orders: Center: [facility] on MWF EDW 57 HD Bath 2K 2.5 Ca Time 3.5 Heparin 1200 Access r I-J and maturing right upper AVF BFR 400 DFR A 1.5 Zemplar IV/HD Epogen 16,000 Units IV/HD Venofer none  Assessment/Plan: 1. 1/2 coag neg staph+ BC 4/23 - very well could be a contaminant, but she has a perm cath - probably should treat x 2 weeks with Vanc (sens not done) because she has a perm cath; will use viable right upper AVF Friday  that has been "resting" due to an infiltration; with skilled cannulation, it should be tried again Friday 2. 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 ESRD - MWF [place] per routine HD Friday 4. Recurrent E coli ESBL+ UTI 4/19 (also ESBL E Coli UTI 3/22) - outpatient culture results had just returned the day of admission; not started on antibioitics PTA; it is sensitive to meropenem (she is on Invanz); a copy of the culture results will be placed in her shadow chart. (She had been scheduled for an appt 4/25 with Dr. Albin Felling, Urology, [facility]); need to decide whether to reschedule at  discharge. 5. Hypertension/volume BP better;. Home meds are clonidine 0.2 tid, norvasc 10, metoprolol 25 vs 50 bid- parameters written to hold; UF 1.5 Wed. Post wt 59 EDW is 57 6. Anemia - HGB variable; Aranesp 100 7. Metabolic bone disease - Continue pholso and 1 mcg zemplar q hd;  8. Nutrition - high protein renal diet; carb mod 9. Diabetes- BS to 285 per primary Lantus 18 + SSI 10. Recent NSTEMI 4/11 - imdur 120, metoprolol 50 bid, (was 25 bid at last d/c), baby asa 11. 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 Diarrhea - c diff neg 4/24, had been + 4 months ago Additional Objective Labs: Basic Metabolic Panel:  Lab 02/25/12 1610 02/24/12 0640 02/24/12 0100  NA 132* 133* 132*  K 3.2* 5.5* 3.7  CL 90* 90* 91*  CO2 27 18* 25  GLUCOSE 96 279* 229*  BUN 42* 21 16  CREATININE 4.09* 2.75* 2.53*  CALCIUM 8.4 9.3 9.1  ALB -- -- --  PHOS -- -- --   Liver Function Tests:  Lab 02/24/12 0100  AST 18  ALT 9  ALKPHOS 87  BILITOT 0.2*  PROT 7.1  ALBUMIN 3.2*   Lab Results  Component Value Date   INR 2.11* 02/25/2012   INR 2.01* 02/24/2012   INR 1.10 02/13/2012    CBC:  Lab 02/25/12 0748 02/24/12 0640 02/24/12  0100  WBC 7.7 4.3 4.4  NEUTROABS -- -- --  HGB 10.6* 13.6 11.6*  HCT 33.6* 43.4 37.5  MCV 91.8 92.1 93.3  PLT 264 259 277   Blood Culture    Component Value Date/Time   SDES BLOOD LEFT HAND 02/24/2012 0100   SPECREQUEST BOTTLES DRAWN AEROBIC ONLY 1CC 02/24/2012 0100   CULT  Value: STAPHYLOCOCCUS SPECIES (COAGULASE NEGATIVE) Note: THE SIGNIFICANCE OF ISOLATING THIS ORGANISM FROM A SINGLE SET OF BLOOD CULTURES WHEN MULTIPLE SETS ARE DRAWN IS UNCERTAIN. PLEASE NOTIFY THE MICROBIOLOGY DEPARTMENT WITHIN ONE WEEK IF SPECIATION AND SENSITIVITIES ARE REQUIRED. Note: Gram Stain Report Called to,Read Back By and Verified With: C. BOKIANG @0610  ON 02/25/12 BY MCLET 02/24/2012 0100   REPTSTATUS 02/26/2012 FINAL 02/24/2012 0100   CBG:  Lab 02/26/12 0751 02/25/12 2115  02/25/12 1642 02/25/12 1236 02/24/12 2135  GLUCAP 70 285* 253* 72 323*  Medications:      . amLODipine  10 mg Oral QHS  . aspirin  81 mg Oral Daily  . azithromycin  500 mg Oral QHS  . calcium acetate  667 mg Oral TID WC  . cloNIDine  0.2 mg Oral TID  . darbepoetin (ARANESP) injection - DIALYSIS  100 mcg Intravenous Q Wed-HD  . ertapenem  500 mg Intravenous QHS  . ertapenem  1 g Intravenous Once  . feeding supplement (NEPRO CARB STEADY)  237 mL Oral TID WC  . insulin aspart  0-15 Units Subcutaneous TID WC  . insulin aspart  0-5 Units Subcutaneous QHS  . insulin glargine  18 Units Subcutaneous QHS  . isosorbide mononitrate  120 mg Oral Daily  . levothyroxine  25 mcg Oral QAC breakfast  . metoprolol tartrate  50 mg Oral BID  . pantoprazole  40 mg Oral Q1200  . paricalcitol  1 mcg Intravenous Q M,W,F-HD  . simvastatin  20 mg Oral q1800  . sodium chloride  3 mL Intravenous Q12H  . vancomycin  1,250 mg Intravenous Once  . vancomycin  500 mg Intravenous Q M,W,F-HD  . warfarin  3 mg Oral q1800  . Warfarin - Pharmacist Dosing Inpatient   Does not apply q1800  . DISCONTD: azithromycin  500 mg Intravenous Q24H    I  have reviewed scheduled and prn medications.  Sheffield Slider, PA-C [facility] Beeper [phone]  02/26/2012,11:27 AM  LOS: 3 days   Patient seen and examined and agree with assessment and plan as above. Will use AVF Friday (beign rested after infiltration). It looks strong, and we would like to get the tunnelled HD catheter out as soon as possible. Recommendations as above. [name]  MD [employer] [phone] pgr    [phone] cell 02/26/2012, 1:34 PM

## 2012-02-26 NOTE — Progress Notes (Addendum)
Physical Therapy Treatment Patient Details Name: [name] [name] MRN: [id number] DOB: [birth year] Today's Date: 02/26/2012 Time: 7829-5621 PT Time Calculation (min): 26 min  PT Assessment / Plan / Recommendation Comments on Treatment Session  Pt. states she feels a little better today and began to ambulate in her room.  Still overall deconditioned from acute illness and will benefit from up in room with nursing staff as an adjunct to PT sessions.      Follow Up Recommendations  Home health PT    Equipment Recommendations  None recommended by PT    Frequency Min 3X/week   Plan Discharge plan remains appropriate    Precautions / Restrictions Precautions Precautions: Fall Precaution Comments: Pt is normally on 2 liters O2 at home Restrictions Weight Bearing Restrictions: No   Pertinent Vitals/Pain  Pt.'s O2 sats upon PT entry into room was 85% on 2L/min.  Spoke with RN and O2 was increased to 3L/min with increase in sats to 93%.  Sats at 92% immediatiely following walk.  Pt. Left on 3L/min O2.   Mobility  Bed Mobility Bed Mobility: Supine to Sit;Sitting - Scoot to Edge of Bed Supine to Sit: 4: Min guard Sitting - Scoot to [place] of Bed: 5: Supervision Details for Bed Mobility Assistance: cues to self monitor for activity tolerance Transfers Transfers: Sit to Stand;Stand to Sit Sit to Stand: 5: Supervision;From bed Details for Transfer Assistance: cues for safety and technique Ambulation/Gait Ambulation/Gait Assistance: 4: Min guard Ambulation Distance (Feet): 20 Feet Assistive device: None;Other (Comment) (hand held assist not needed today) Ambulation/Gait Assistance Details: no overt LOB noted. Gait Pattern: Within Functional Limits Stairs: No    Exercises     PT Goals Acute Rehab PT Goals PT Goal Formulation: With patient PT Goal: Supine/Side to Sit - Progress: Progressing toward goal PT Goal: Stand to Sit - Progress: Progressing toward goal PT Goal: Ambulate - Progress:  Progressing toward goal  Visit Information  Assistance Needed: +1    Subjective Data  Subjective: Feeling better, less diarrhea.   Cognition  Overall Cognitive Status: Appears within functional limits for tasks assessed/performed Arousal/Alertness: Awake/alert Orientation Level: Oriented X4 / Intact Behavior During Session: [facility] for tasks performed    Balance     End of Session PT - End of Session Equipment Utilized During Treatment: Gait belt Activity Tolerance: Patient tolerated treatment well Patient left: in chair;with call bell/phone within reach Nurse Communication: Mobility status    Ferman Hamming 02/26/2012, 12:04 PM Acute Rehabilitation Services [phone] (pager)

## 2012-02-26 NOTE — Evaluation (Signed)
Occupational Therapy Evaluation Patient Details Name: [name] [name] MRN: [id number] DOB: 1941/07/03 Today's Date: 02/26/2012 Time: 4098-1191 OT Time Calculation (min): 17 min  OT Assessment / Plan / Recommendation Clinical Impression  This 71 y.o. female admitted for PNA and diarrhea.  Pt. demonstrates decreased activity tolerance due to deconditioning.  Feel she will benefit from OT to maximize safety and independence with BADLs to allow pt. to return home at modified independent level vs. supervision    OT Assessment  Patient needs continued OT Services    Follow Up Recommendations  No OT follow up;Supervision/Assistance - 24 hour    Equipment Recommendations  None recommended by OT    Frequency Min 2X/week    Precautions / Restrictions Precautions Precautions: Fall Precaution Comments: Pt is normally on 2 liters O2 at home Restrictions Weight Bearing Restrictions: No       ADL  Eating/Feeding: Simulated;Independent Where Assessed - Eating/Feeding: Chair Grooming: Simulated;Wash/dry face;Brushing hair;Supervision/safety Where Assessed - Grooming: Standing at sink Upper Body Bathing: Simulated;Set up Where Assessed - Upper Body Bathing: Sitting, chair Lower Body Bathing: Simulated;Supervision/safety Where Assessed - Lower Body Bathing: Sit to stand from chair Upper Body Dressing: Simulated;Set up Where Assessed - Upper Body Dressing: Sitting, chair Lower Body Dressing: Simulated;Supervision/safety;Performed Where Assessed - Lower Body Dressing: Sit to stand from chair Toilet Transfer: Supervision/safety;Simulated Toilet Transfer Method: Proofreader: Comfort height toilet Toileting - Clothing Manipulation: Simulated;Supervision/safety Where Assessed - Toileting Clothing Manipulation: Standing Toileting - Hygiene: Simulated;Independent Where Assessed - Toileting Hygiene: Sit on 3-in-1 or toilet Equipment Used:  (02) Ambulation Related to ADLs: Pt.  ambulated in room with supervision ADL Comments: Pt. fatigues quickly requiring multiple seated rest breaks    OT Goals Acute Rehab OT Goals OT Goal Formulation: With patient Time For Goal Achievement: 03/04/12 Potential to Achieve Goals: Good ADL Goals Pt Will Perform Grooming: with modified independence;Standing at sink ADL Goal: Grooming - Progress: Goal set today Pt Will Perform Upper Body Bathing: with modified independence;Sitting at sink ADL Goal: Upper Body Bathing - Progress: Goal set today Pt Will Perform Lower Body Bathing: with modified independence;Sit to stand from chair ADL Goal: Lower Body Bathing - Progress: Goal set today Pt Will Perform Upper Body Dressing: with modified independence;Sitting, chair ADL Goal: Upper Body Dressing - Progress: Goal set today Pt Will Perform Lower Body Dressing: with modified independence;Sit to stand from chair ADL Goal: Lower Body Dressing - Progress: Goal set today Pt Will Transfer to Toilet: with modified independence;Ambulation;3-in-1 ADL Goal: Toilet Transfer - Progress: Goal set today Pt Will Perform Toileting - Clothing Manipulation: with modified independence;Standing ADL Goal: Toileting - Clothing Manipulation - Progress: Goal set today  Visit Information  Last OT Received On: 02/26/12 Assistance Needed: +1    Subjective Data  Subjective: "I just want to go back to sleep" Patient Stated Goal: To go home   Prior Functioning  Home Living Lives With: Son Available Help at Discharge: Family;Available PRN/intermittently Type of Home: House Home Access: Ramped entrance Home Layout: One level Bathroom Shower/Tub: Walk-in shower (Pt sponge bathes) Bathroom Toilet: Standard Bathroom Accessibility: Yes How Accessible: Accessible via walker Home Adaptive Equipment: Bedside commode/3-in-1 Prior Function Level of Independence: Independent Able to Take Stairs?: Reciprically Driving: No Vocation:  Retired Musician: No difficulties Dominant Hand: Right    Cognition  Overall Cognitive Status: Appears within functional limits for tasks assessed/performed Arousal/Alertness: Awake/alert Orientation Level: Oriented X4 / Intact Behavior During Session: [facility] for tasks performed    Extremity/Trunk Assessment  Mobility Bed Mobility Bed Mobility: Supine to Sit;Sitting - Scoot to Edge of Bed Supine to Sit: 5: Supervision;HOB elevated;With rails (30) Sitting - Scoot to Edge of Bed: 6: Modified independent (Device/Increase time) Sit to Supine: 6: Modified independent (Device/Increase time) Details for Bed Mobility Assistance: cues to self monitor for activity tolerance Transfers Transfers: Sit to Stand;Stand to Sit Sit to Stand: 5: Supervision;From bed Stand to Sit: 5: Supervision;With upper extremity assist;To bed;To chair/3-in-1 Details for Transfer Assistance: cues for safety and technique   Exercise    Balance Balance Balance Assessed: Yes Dynamic Standing Balance Dynamic Standing - Balance Support: No upper extremity supported Dynamic Standing - Level of Assistance: 5: Stand by assistance Dynamic Standing - Balance Activities: Reaching for objects (Does not reach to floor.  Retrieved items from closet)  End of Session OT - End of Session Activity Tolerance: Patient limited by fatigue Patient left: in bed;with call bell/phone within reach   Kayle Correa M 02/26/2012, 3:50 PM

## 2012-02-26 NOTE — Consult Note (Signed)
Date of Admission:  02/23/2012  Date of Consult:  02/26/2012  Reason for Consult: [facility] in blood Referring Physician: Dr. Gonzella Lex   HPI: [name] [name] is an 71 y.o. female with ESRD on HD via catheter right side who was admitted with cough and fever, chills. CXR with questionable PNA and pt started on vancomycin and ceftriaxone, then vancomycin and invanz and now azithromycin on board. Her blood cultures are now growing GPCC in 1/2 cultures and as I am writing note they have been iD as CNS.   Past Medical History  Diagnosis Date  . Diabetes mellitus   . Hypercholesterolemia   . Hypothyroidism   . Colitis   . Chronic kidney disease   . Coronary artery disease   . Hypertension   . GERD (gastroesophageal reflux disease)   . Myocardial infarction   . Peripheral vascular disease   . COPD (chronic obstructive pulmonary disease)   . Pneumonia   . Angina   . Shortness of breath     Past Surgical History  Procedure Date  . Tubal ligation   . Amputaton of right fifth toe   . Tonsillectomy   . Insertion of dialysis catheter 10/23/2011    Procedure: INSERTION OF DIALYSIS CATHETER;  Surgeon: Chuck Hint, MD;  Location: [facility] OR;  Service: Vascular;  Laterality: Right;  Right internal jugular Insertion diatek catheter  ergies:   Allergies  Allergen Reactions  . Novocain Other (See Comments)    Unknown     Medications: I have reviewed patients current medications as documented in Epic Anti-infectives     Start     Dose/Rate Route Frequency Ordered Stop   02/26/12 2200   azithromycin (ZITHROMAX) tablet 500 mg        500 mg Oral Daily at bedtime 02/26/12 0922     02/25/12 1200   vancomycin (VANCOCIN) 500 mg in sodium chloride 0.9 % 100 mL IVPB        500 mg 100 mL/hr over 60 Minutes Intravenous Every M-W-F (Hemodialysis) 02/24/12 0533     02/24/12 2359   cefTRIAXone (ROCEPHIN) 1 g in dextrose 5 % 50 mL IVPB  Status:  Discontinued        1 g 100 mL/hr over 30 Minutes  Intravenous Daily at bedtime 02/23/12 2309 02/23/12 2327   02/24/12 2359   ertapenem (INVANZ) 1 g in sodium chloride 0.9 % 50 mL IVPB  Status:  Discontinued        1 g 100 mL/hr over 30 Minutes Intravenous Daily at bedtime 02/23/12 2327 02/24/12 0537   02/24/12 2200   ertapenem (INVANZ) 0.5 g in sodium chloride 0.9 % 50 mL IVPB     Comments: Ertapenem 500 mg IV q24h for CrCl < 30 mL/min      500 mg 100 mL/hr over 30 Minutes Intravenous Daily at bedtime 02/24/12 0537     02/24/12 2100   azithromycin (ZITHROMAX) 500 mg in dextrose 5 % 250 mL IVPB  Status:  Discontinued        500 mg 250 mL/hr over 60 Minutes Intravenous Every 24 hours 02/23/12 2309 02/26/12 0922   02/24/12 0600   vancomycin (VANCOCIN) 1,250 mg in sodium chloride 0.9 % 250 mL IVPB        1,250 mg 166.7 mL/hr over 90 Minutes Intravenous  Once 02/24/12 0533     02/24/12 0600   ertapenem (INVANZ) 1 g in sodium chloride 0.9 % 50 mL IVPB        1  g 100 mL/hr over 30 Minutes Intravenous  Once 02/24/12 0537     02/24/12 0100   azithromycin (ZITHROMAX) 500 mg in dextrose 5 % 250 mL IVPB  Status:  Discontinued        500 mg 250 mL/hr over 60 Minutes Intravenous  Once 02/23/12 2322 02/23/12 2327          Social History:  reports that she quit smoking about 8 months ago. Her smoking use included Cigarettes. She has a 40 pack-year smoking history. She does not have any smokeless tobacco history on file. She reports that she does not drink alcohol or use illicit drugs.  Family History  Problem Relation Age of Onset  . Diabetes type II    . Hypertension      As in HPI and primary teams notes otherwise 12 point review of systems is negative  Blood pressure 133/68, pulse 82, temperature 97.6 F (36.4 C), temperature source Oral, resp. rate 20, height 5\' 5"  (1.651 m), weight 130 lb 1.1 oz (59 kg), SpO2 94.00%.  General: Alert and awake, oriented x3, not in any acute distress. HEENT: anicteric sclera, pupils reactive to light  and accommodation, EOMI, oropharynx clear and without exudate CVS regular rate, normal r,  no murmur rubs or gallops Chest: clear to auscultation bilaterally, no wheezing, rales or rhonchi Abdomen: soft nontender, nondistended, normal bowel sounds, Extremities: no  clubbing or edema noted bilaterally Skin: HD catheter in place and clean Neuro: nonfocal, strength and sensation intact   Results for orders placed during the hospital encounter of 02/23/12 (from the past 48 hour(s))  GLUCOSE, CAPILLARY     Status: Abnormal   Collection Time   02/24/12 11:54 AM      Component Value Range Comment   Glucose-Capillary 523 (*) 70 - 99 (mg/dL)   GLUCOSE, CAPILLARY     Status: Abnormal   Collection Time   02/24/12 12:29 PM      Component Value Range Comment   Glucose-Capillary 472 (*) 70 - 99 (mg/dL)   GLUCOSE, CAPILLARY     Status: Abnormal   Collection Time   02/24/12  5:11 PM      Component Value Range Comment   Glucose-Capillary 344 (*) 70 - 99 (mg/dL)   GLUCOSE, CAPILLARY     Status: Abnormal   Collection Time   02/24/12  9:35 PM      Component Value Range Comment   Glucose-Capillary 323 (*) 70 - 99 (mg/dL)   PROTIME-INR     Status: Abnormal   Collection Time   02/25/12  7:48 AM      Component Value Range Comment   Prothrombin Time 24.0 (*) 11.6 - 15.2 (seconds)    INR 2.11 (*) 0.00 - 1.49    BASIC METABOLIC PANEL     Status: Abnormal   Collection Time   02/25/12  7:48 AM      Component Value Range Comment   Sodium 132 (*) 135 - 145 (mEq/L)    Potassium 3.2 (*) 3.5 - 5.1 (mEq/L) DELTA CHECK NOTED   Chloride 90 (*) 96 - 112 (mEq/L)    CO2 27  19 - 32 (mEq/L)    Glucose, Bld 96  70 - 99 (mg/dL)    BUN 42 (*) 6 - 23 (mg/dL) DELTA CHECK NOTED   Creatinine, Ser 4.09 (*) 0.50 - 1.10 (mg/dL)    Calcium 8.4  8.4 - 10.5 (mg/dL)    GFR calc non Af Amer 10 (*) >90 (mL/min)  GFR calc Af Amer 12 (*) >90 (mL/min)   CBC     Status: Abnormal   Collection Time   02/25/12  7:48 AM       Component Value Range Comment   WBC 7.7  4.0 - 10.5 (K/uL)    RBC 3.66 (*) 3.87 - 5.11 (MIL/uL)    Hemoglobin 10.6 (*) 12.0 - 15.0 (g/dL)    HCT 45.4 (*) 09.8 - 46.0 (%)    MCV 91.8  78.0 - 100.0 (fL)    MCH 29.0  26.0 - 34.0 (pg)    MCHC 31.5  30.0 - 36.0 (g/dL)    RDW 11.9 (*) 14.7 - 15.5 (%)    Platelets 264  150 - 400 (K/uL)   GLUCOSE, CAPILLARY     Status: Normal   Collection Time   02/25/12 12:36 PM      Component Value Range Comment   Glucose-Capillary 72  70 - 99 (mg/dL)   CLOSTRIDIUM DIFFICILE BY PCR     Status: Normal   Collection Time   02/25/12  2:21 PM      Component Value Range Comment   C difficile by pcr NEGATIVE  NEGATIVE    GLUCOSE, CAPILLARY     Status: Abnormal   Collection Time   02/25/12  4:42 PM      Component Value Range Comment   Glucose-Capillary 253 (*) 70 - 99 (mg/dL)    Comment 1 Notify RN      Comment 2 Documented in Chart     GLUCOSE, CAPILLARY     Status: Abnormal   Collection Time   02/25/12  9:15 PM      Component Value Range Comment   Glucose-Capillary 285 (*) 70 - 99 (mg/dL)   GLUCOSE, CAPILLARY     Status: Normal   Collection Time   02/26/12  7:51 AM      Component Value Range Comment   Glucose-Capillary 70  70 - 99 (mg/dL)       Component Value Date/Time   SDES BLOOD LEFT HAND 02/24/2012 0100   SPECREQUEST BOTTLES DRAWN AEROBIC ONLY 1CC 02/24/2012 0100   CULT  Value: STAPHYLOCOCCUS SPECIES (COAGULASE NEGATIVE) Note: THE SIGNIFICANCE OF ISOLATING THIS ORGANISM FROM A SINGLE SET OF BLOOD CULTURES WHEN MULTIPLE SETS ARE DRAWN IS UNCERTAIN. PLEASE NOTIFY THE MICROBIOLOGY DEPARTMENT WITHIN ONE WEEK IF SPECIATION AND SENSITIVITIES ARE REQUIRED. Note: Gram Stain Report Called to,Read Back By and Verified With: C. BOKIANG @0610  ON 02/25/12 BY MCLET 02/24/2012 0100   REPTSTATUS 02/26/2012 FINAL 02/24/2012 0100   No results found.   Recent Results (from the past 720 hour(s))  CULTURE, BLOOD (ROUTINE X 2)     Status: Normal   Collection Time    02/04/12  3:40 PM      Component Value Range Status Comment   Specimen Description BLOOD FINGER LEFT   Final    Special Requests BOTTLES DRAWN AEROBIC ONLY 4CC   Final    Culture  Setup Time [id number]   Final    Culture NO GROWTH 5 DAYS   Final    Report Status 02/10/2012 FINAL   Final   CULTURE, BLOOD (ROUTINE X 2)     Status: Normal   Collection Time   02/04/12  4:00 PM      Component Value Range Status Comment   Specimen Description BLOOD ARM LEFT   Final    Special Requests BOTTLES DRAWN AEROBIC ONLY 8CC   Final    Culture  Setup Time  [id number]   Final    Culture NO GROWTH 5 DAYS   Final    Report Status 02/10/2012 FINAL   Final   MRSA PCR SCREENING     Status: Normal   Collection Time   02/06/12  2:25 AM      Component Value Range Status Comment   MRSA by PCR NEGATIVE  NEGATIVE  Final   CULTURE, BLOOD (ROUTINE X 2)     Status: Normal (Preliminary result)   Collection Time   02/24/12 12:35 AM      Component Value Range Status Comment   Specimen Description BLOOD LEFT ARM   Final    Special Requests BOTTLES DRAWN AEROBIC ONLY 3CC   Final    Culture  Setup Time [id number]   Final    Culture     Final    Value:        BLOOD CULTURE RECEIVED NO GROWTH TO DATE CULTURE WILL BE HELD FOR 5 DAYS BEFORE ISSUING A FINAL NEGATIVE REPORT   Report Status PENDING   Incomplete   CULTURE, BLOOD (ROUTINE X 2)     Status: Normal   Collection Time   02/24/12  1:00 AM      Component Value Range Status Comment   Specimen Description BLOOD LEFT HAND   Final    Special Requests BOTTLES DRAWN AEROBIC ONLY 1CC   Final    Culture  Setup Time [id number]   Final    Culture     Final    Value: STAPHYLOCOCCUS SPECIES (COAGULASE NEGATIVE)     Note: THE SIGNIFICANCE OF ISOLATING THIS ORGANISM FROM A SINGLE SET OF BLOOD CULTURES WHEN MULTIPLE SETS ARE DRAWN IS UNCERTAIN. PLEASE NOTIFY THE MICROBIOLOGY DEPARTMENT WITHIN ONE WEEK IF SPECIATION AND SENSITIVITIES ARE REQUIRED.     Note: Gram Stain Report  Called to,Read Back By and Verified With: C. BOKIANG @0610  ON 02/25/12 BY MCLET   Report Status 02/26/2012 FINAL   Final   CLOSTRIDIUM DIFFICILE BY PCR     Status: Normal   Collection Time   02/25/12  2:21 PM      Component Value Range Status Comment   C difficile by pcr NEGATIVE  NEGATIVE  Final      Impression/Recommendation  71 year old admitted with ? Pna, with fever and cough. She has CNS in 1/2 cultures. Nephrology team tells me pt had ESBL from urine when she had hematuria on the 19th of April.  1) CNS: 1/2==Contaminant  2)CXR; clear other than COPD changes NO need for azithro  3) ESBL in urine on 19th. Afebrile no symptoms. At most this is uncomplicated UTI. Give one more dose of invanz and then dc it  I will sign off for now please call with further questions  Thank you so much for this interesting consult,   Acey Lav 02/26/2012, 11:49 AM   [phone] (pager) [phone] (office)

## 2012-02-26 NOTE — Progress Notes (Signed)
Subjective: Patient seen and examined this morning . Feels her breathing to be better. Had few episodes of diarrhea yesterday but cdiff negative.   Objective:  Vital signs in last 24 hours:  Filed Vitals:   02/25/12 2117 02/26/12 0530 02/26/12 1000 02/26/12 1400  BP: 93/53 118/60 133/68 95/57  Pulse: 81 81 82 74  Temp: 98.3 F (36.8 C) 98.4 F (36.9 C) 97.6 F (36.4 C) 98.2 F (36.8 C)  TempSrc: Oral Oral Oral Oral  Resp: 20 20 20 20   Height: 5\' 5"  (1.651 m)     Weight: 59 kg (130 lb 1.1 oz)     SpO2: 93% 91% 94% 96%    Intake/Output from previous day:   Intake/Output Summary (Last 24 hours) at 02/26/12 1612 Last data filed at 02/26/12 1300  Gross per 24 hour  Intake   1260 ml  Output      0 ml  Net   1260 ml    Physical Exam:   General elderly female in no acute distress.  HEENT: no pallor, no icterus, moist oral mucosa, no JVD, no lymphadenopathy  Heart: Normal s1 &s2 Regular rate and rhythm, without murmurs, rubs, gallops.  Lungs: Coarse breath sounds b/l, rt sided permcath  Abdomen: Soft, nontender, nondistended, positive bowel sounds.  Extremities: No clubbing cyanosis or edema with positive pedal pulses.  Neuro: Alert, awake, oriented x3, nonfocal.   Lab Results:  Basic Metabolic Panel:    Component Value Date/Time   NA 132* 02/25/2012 0748   K 3.2* 02/25/2012 0748   CL 90* 02/25/2012 0748   CO2 27 02/25/2012 0748   BUN 42* 02/25/2012 0748   CREATININE 4.09* 02/25/2012 0748   GLUCOSE 96 02/25/2012 0748   CALCIUM 8.4 02/25/2012 0748   CBC:    Component Value Date/Time   WBC 7.7 02/25/2012 0748   HGB 10.6* 02/25/2012 0748   HCT 33.6* 02/25/2012 0748   PLT 264 02/25/2012 0748   MCV 91.8 02/25/2012 0748   NEUTROABS 23.4* 02/06/2012 0407   LYMPHSABS 0.6* 02/06/2012 0407   MONOABS 0.7 02/06/2012 0407   EOSABS 0.0 02/06/2012 0407   BASOSABS 0.1 02/06/2012 0407    Recent Results (from the past 240 hour(s))  CULTURE, BLOOD (ROUTINE X 2)     Status: Normal  (Preliminary result)   Collection Time   02/24/12 12:35 AM      Component Value Range Status Comment   Specimen Description BLOOD LEFT ARM   Final    Special Requests BOTTLES DRAWN AEROBIC ONLY 3CC   Final    Culture  Setup Time [id number]   Final    Culture     Final    Value:        BLOOD CULTURE RECEIVED NO GROWTH TO DATE CULTURE WILL BE HELD FOR 5 DAYS BEFORE ISSUING A FINAL NEGATIVE REPORT   Report Status PENDING   Incomplete   CULTURE, BLOOD (ROUTINE X 2)     Status: Normal   Collection Time   02/24/12  1:00 AM      Component Value Range Status Comment   Specimen Description BLOOD LEFT HAND   Final    Special Requests BOTTLES DRAWN AEROBIC ONLY 1CC   Final    Culture  Setup Time [id number]   Final    Culture     Final    Value: STAPHYLOCOCCUS SPECIES (COAGULASE NEGATIVE)     Note: THE SIGNIFICANCE OF ISOLATING THIS ORGANISM FROM A SINGLE SET OF BLOOD CULTURES WHEN MULTIPLE SETS  ARE DRAWN IS UNCERTAIN. PLEASE NOTIFY THE MICROBIOLOGY DEPARTMENT WITHIN ONE WEEK IF SPECIATION AND SENSITIVITIES ARE REQUIRED.     Note: Gram Stain Report Called to,Read Back By and Verified With: C. Bertha Stakes @0610  ON 02/25/12 BY MCLET   Report Status 02/26/2012 FINAL   Final   CLOSTRIDIUM DIFFICILE BY PCR     Status: Normal   Collection Time   02/25/12  2:21 PM      Component Value Range Status Comment   C difficile by pcr NEGATIVE  NEGATIVE  Final     Studies/Results: No results found.  Medications: Scheduled Meds:   . amLODipine  10 mg Oral QHS  . aspirin  81 mg Oral Daily  . calcium acetate  667 mg Oral TID WC  . cloNIDine  0.2 mg Oral TID  . darbepoetin (ARANESP) injection - DIALYSIS  100 mcg Intravenous Q Wed-HD  . ertapenem  500 mg Intravenous QHS  . feeding supplement (NEPRO CARB STEADY)  237 mL Oral TID WC  . insulin aspart  0-15 Units Subcutaneous TID WC  . insulin aspart  0-5 Units Subcutaneous QHS  . insulin glargine  18 Units Subcutaneous QHS  . isosorbide mononitrate  120 mg  Oral Daily  . levothyroxine  25 mcg Oral QAC breakfast  . metoprolol tartrate  50 mg Oral BID  . pantoprazole  40 mg Oral Q1200  . paricalcitol  1 mcg Intravenous Q M,W,F-HD  . simvastatin  20 mg Oral q1800  . sodium chloride  3 mL Intravenous Q12H  . warfarin  3 mg Oral q1800  . Warfarin - Pharmacist Dosing Inpatient   Does not apply q1800  . DISCONTD: azithromycin  500 mg Intravenous Q24H  . DISCONTD: azithromycin  500 mg Oral QHS  . DISCONTD: ertapenem  1 g Intravenous Once  . DISCONTD: vancomycin  1,250 mg Intravenous Once  . DISCONTD: vancomycin  500 mg Intravenous Q M,W,F-HD   Continuous Infusions:  PRN Meds:.sodium chloride, sodium chloride, acetaminophen, acetaminophen, albuterol, benzonatate, calcium carbonate (dosed in mg elemental calcium), camphor-menthol, docusate sodium, feeding supplement (NEPRO CARB STEADY), heparin, hydrOXYzine, ipratropium, lidocaine, lidocaine-prilocaine, nitroGLYCERIN, ondansetron (ZOFRAN) IV, ondansetron, oxyCODONE-acetaminophen, pentafluoroprop-tetrafluoroeth, sorbitol, zolpidem, DISCONTD: heparin, DISCONTD: heparin  Assessment/Plan:  71 y/o female with hx of DM, COPD, CAD ESRD on HD, recent NSTEMI s/p cath, CHF, HTN, anemia admitted with pneumonia and recurrent UTI with ESBL and now bacteremia with 1/2 blood cx growing GPC.   GPC Bacteremia  blood cx growing GPC ( sent on 4/23) only 1/2 cx so possibly a skin contaminant no clear source, PNA vs recurrent UTI  Afebrile for past 48 hrs will resend UA, urine cx  Appreciate ID recs. Will give 12 more dose of ertapenem and discontinue  ?Health care associated Pneumonia  chest x-ray not clearly suggestive for pneumonia.  Continue vancomycin for now with HD. Cont ertapenem( day 4 ) and zithromax. Await final cx and sensitivity .  Cont duonebs and mucinex   ESBL UTI  multiple episodes with multiple hospitalizations . Last cx from 3/25 and appears patient completed abx course with meropenem   COPD on  home 02:  on 3 L of oxygen at home. 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 BP actually  on lower side for past few days. Will reduce BP meds   Hypothyroidism  Cont synthroid   ESRD (end stage renal disease) on dialysis  Cont as scheduled   DVT prophylaxis'  on coumadin   Full code    LOS: 3 days   Gianlucas Evenson 02/26/2012, 4:12 PM

## 2012-02-27 ENCOUNTER — Inpatient Hospital Stay (HOSPITAL_COMMUNITY): Payer: Medicare Other

## 2012-02-27 LAB — GLUCOSE, CAPILLARY: Glucose-Capillary: 125 mg/dL — ABNORMAL HIGH (ref 70–99)

## 2012-02-27 LAB — PROTIME-INR: Prothrombin Time: 31.8 seconds — ABNORMAL HIGH (ref 11.6–15.2)

## 2012-02-27 LAB — RENAL FUNCTION PANEL
CO2: 27 mEq/L (ref 19–32)
Calcium: 8.3 mg/dL — ABNORMAL LOW (ref 8.4–10.5)
Chloride: 93 mEq/L — ABNORMAL LOW (ref 96–112)
GFR calc Af Amer: 15 mL/min — ABNORMAL LOW (ref >90)
GFR calc non Af Amer: 13 mL/min — ABNORMAL LOW (ref >90)
Glucose, Bld: 213 mg/dL — ABNORMAL HIGH (ref 70–99)
Potassium: 3.2 mEq/L — ABNORMAL LOW (ref 3.5–5.1)
Sodium: 131 mEq/L — ABNORMAL LOW (ref 135–145)

## 2012-02-27 LAB — CBC
Hemoglobin: 9.6 g/dL — ABNORMAL LOW (ref 12.0–15.0)
MCH: 28.2 pg (ref 26.0–34.0)
RBC: 3.4 MIL/uL — ABNORMAL LOW (ref 3.87–5.11)
WBC: 6.1 10*3/uL (ref 4.0–10.5)

## 2012-02-27 MED ORDER — AMLODIPINE BESYLATE 2.5 MG PO TABS: 2.5000 mg | ORAL_TABLET | Freq: Every day | ORAL | Status: DC

## 2012-02-27 MED ORDER — PARICALCITOL 5 MCG/ML IV SOLN
INTRAVENOUS | Status: AC
  Administered 2012-02-27: 15:00:00
  Filled 2012-02-27: qty 1

## 2012-02-27 MED ORDER — WARFARIN SODIUM 3 MG PO TABS: 3.0000 mg | ORAL_TABLET | Freq: Every day | ORAL | Status: DC

## 2012-02-27 MED ORDER — LEVOFLOXACIN 500 MG PO TABS: 500.0000 mg | ORAL_TABLET | Freq: Once | ORAL | Status: AC

## 2012-02-27 NOTE — Progress Notes (Signed)
Subjective: No current complaints, breathing fairly well.  Objective: Vital signs in last 24 hours: Temp:  [97.3 F (36.3 C)-98.2 F (36.8 C)] 97.6 F (36.4 C) (04/26 0533) Pulse Rate:  [73-82] 73  (04/26 0533) Resp:  [20] 20  (04/26 0533) BP: (95-147)/(57-68) 147/66 mmHg (04/26 0533) SpO2:  [94 %-98 %] 97 % (04/26 0533) Weight:  [59 kg (130 lb 1.1 oz)] 59 kg (130 lb 1.1 oz) (04/25 2150) Weight change: -0.4 kg (-14.1 oz)  Intake/Output from previous day: 04/25 0701 - 04/26 0700 In: 890 [P.O.:840; IV Piggyback:50] Out: 0    EXAM: General appearance: Alert, in no apparent distress Resp:  CTA bilaterally Cardio:  RRR without murmur GI:  + BS, soft and nontender Extremities:  No edema Access: Right IJ catheter, also AVF @ RUA with + bruit  Lab Results:  Basename 02/25/12 0748  WBC 7.7  HGB 10.6*  HCT 33.6*  PLT 264   BMET:  Basename 02/25/12 0748  NA 132*  K 3.2*  CL 90*  CO2 27  GLUCOSE 96  BUN 42*  CREATININE 4.09*  CALCIUM 8.4  ALBUMIN --   No results found for this basename: PTH:2 in the last 72 hours Iron Studies: No results found for this basename: IRON,TIBC,TRANSFERRIN,FERRITIN in the last 72 hours  Assessment/Plan: 1. Coagulase negative staph - 1/2 blood cultures +, likely a contaminant, on Vanc (sens not done) because she has a perm cath; will use viable right upper AVF today that has been "resting" due to an infiltration. 2. Recurrent PNA vs bronchitis in COPD - quit smoking 6 months ago; mininebs; on Vancomycin and Invanz IV. azithro now po. 3. ESRD - on HD on MWF [place] per routine HD Friday. 4. Recurrent E coli ESBL+ UTI 4/19 (also ESBL E Coli UTI 3/22) - outpatient culture results had just returned the day of admission; not started on antibioitics PTA; it is sensitive to meropenem (she is on Invanz); a copy of the culture results will be placed in her shadow chart. (She had been scheduled for an appt 4/25 with Dr. Albin Felling, Urology, [facility]); need to decide  whether to reschedule at discharge. 5. Hypertension/volume - BP better; home meds are clonidine 0.2 tid, norvasc 10, metoprolol 25 vs 50 bid- parameters written to hold; UF 1.5 Wed, post wt 59 (EDW is 57).  UF goal of 2 L today. 6. Anemia - Hgb 10.6; Aranesp 100 mcg on Wed. 7. Metabolic bone disease - Continue pholso and 1 mcg zemplar q hd.  Renal panel pending. 8. Nutrition - high protein renal diet; carb mod 9. Diabetes - BS to 182 per primary Lantus 18 + SSI 10. Recent NSTEMI 4/11 - imdur 120, metoprolol 50 bid, (was 25 bid at last d/c), baby asa. 11. Hx PE - on coumadin since 10/2011, INR therapeutic. 12. Diarrhea - c diff neg 4/24, had been + 4 months ago.     LOS: 4 days   LYLES,CHARLES 02/27/2012,9:17 AM  Patient seen and examined and agree with assessment and plan as above.  [name]  MD [facility] [phone] pgr    [phone] cell 02/27/2012, 2:44 PM

## 2012-02-27 NOTE — Progress Notes (Signed)
   CARE MANAGEMENT NOTE 02/27/2012  Patient:  Hockett,Dorella   Account Number:  [id number]  Date Initiated:  02/24/2012  Documentation initiated by:  Darlyne Russian  Subjective/Objective Assessment:   Patient admitted with pneumonia     Action/Plan:   progression of care and discharge planning   Anticipated DC Date:  02/27/2012   Anticipated DC Plan:  HOME/SELF CARE         Choice offered to / List presented to:          [facility] arranged  HH-2 PT  HH-1 RN      [facility] agency  [facility] HE[facility]   Status of service:  In process, will continue to follow Medicare Important Message given?   (If response is "NO", the following Medicare IM given date fields will be blank) Date Medicare IM given:   Date Additional Medicare IM given:    Discharge Disposition:  HOME W HOME HEALTH SERVICES  Per UR Regulation:  Reviewed for med. necessity/level of care/duration of stay  If discussed at Long Length of Stay Meetings, dates discussed:    Comments:  02/27/12 [name], RN, BSN 1659 PT IS TO DC TO HOME WITH HH PT/RN FROM [facility]  02/25/12 [name], RN, BSN 1245 PT IS A FREQUENT FLYER AND WAS FROM HOME WITH [facility] RN/PT WITH [facility].  WILL F/U ON DC NEEDS  02/24/2012  [address] RN, [place] [phone] Utilization review completed.

## 2012-02-27 NOTE — Progress Notes (Signed)
Pt in hemo 

## 2012-02-27 NOTE — Discharge Summary (Addendum)
Patient ID: [name] [name] MRN: [id number] DOB/AGE: Sep 28, 1941 71 y.o.  Admit date: 02/23/2012 Discharge date: 02/27/2012  Primary Care Physician:  Quentin Mulling, MD, MD  Discharge Diagnoses:     Principal Problem:  *Pneumonia ( presumed healthcare associated)  Active Problems:  Diabetes mellitus  Peripheral arterial disease  Hypercholesterolemia  Hypothyroidism  HTN (hypertension)  ESRD (end stage renal disease) on dialysis  History of pulmonary embolus (PE)  CAD (coronary artery disease)  CHF (congestive heart failure)   Medication List  As of 02/27/2012 10:35 AM   TAKE these medications         albuterol (5 MG/ML) 0.5% nebulizer solution   Commonly known as: PROVENTIL   Take 2.5 mg by nebulization every 4 (four) hours as needed. For wheezing/shortness of breath.      amLODipine 2.5 MG tablet   Commonly known as: NORVASC   Take 2.5  mg by mouth daily.      aspirin 81 MG chewable tablet   Chew 81 mg by mouth daily.      calcium acetate 667 MG capsule   Commonly known as: PHOSLO   Take 667 mg by mouth 3 (three) times daily with meals.      cloNIDine 0.2 MG tablet   Commonly known as: CATAPRES   Take 0.2 mg by mouth 3 (three) times daily.      darbepoetin 60 MCG/0.3ML Soln   Commonly known as: ARANESP   Inject 60 mcg into the vein every Friday with hemodialysis.      insulin glargine 100 UNIT/ML injection   Commonly known as: LANTUS   Inject 18 Units into the skin at bedtime.      isosorbide mononitrate 120 MG 24 hr tablet   Commonly known as: IMDUR   Take 120 mg by mouth daily.      levofloxacin 500 MG tablet   Commonly known as: LEVAQUIN   Take 1 tablet (500 mg total) by mouth once.   Start taking on: 02/28/2012      levothyroxine 25 MCG tablet   Commonly known as: SYNTHROID, LEVOTHROID   Take 25 mcg by mouth daily.      metoprolol tartrate 25 MG tablet   Commonly known as: LOPRESSOR   Take 25 mg by mouth 2 (two) times daily.      nitroGLYCERIN 0.4  MG SL tablet   Commonly known as: NITROSTAT   Place 0.4 mg under the tongue every 5 (five) minutes as needed. For chest pain.      oxyCODONE-acetaminophen 5-325 MG per tablet   Commonly known as: PERCOCET   Take 1 tablet by mouth every 6 (six) hours as needed. For pain.      pantoprazole 40 MG tablet   Commonly known as: PROTONIX   Take 40 mg by mouth daily.      PROBIOTIC PO   Take 1 capsule by mouth daily.      simvastatin 10 MG tablet   Commonly known as: ZOCOR   Take 20 mg by mouth daily at 6 PM.      sodium chloride 0.9 % SOLN 100 mL with ferric gluconate 12.5 MG/ML SOLN 125 mg   Inject 62.5 mg into the vein every Wednesday with hemodialysis.      warfarin 2 MG tablet   Commonly known as: COUMADIN   Take 3 mg by mouth daily.            Disposition and Follow-up: home with outpt follow up with PCP and HD  Consults:   [place] [facility] ID Zenaida Niece dam)  Significant Diagnostic Studies:  Dg Chest 2 View  02/24/2012  *RADIOLOGY REPORT*  Clinical Data: Shortness of breath  CHEST - 2 VIEW  Comparison: CT same date and chest radiograph same date  Findings: Dual lumen right IJ approach hemodialysis catheter tips terminate over the distal SVC and cavoatrial junction, respectively.  Heart size is normal.  Patchy nodular right upper lobe opacity overlies the right posterior fourth rib, better seen on dissimilar prior exam ordered day.  Hyperinflation again suggests COPD.  Trace effusions are again noted.  No compression fracture.  Mild central bronchial wall thickening again noted.  IMPRESSION: No significant change from exam prior today.  Stable findings of COPD with probable superimposed bronchitis and nonspecific right upper lobe nodularity.  Follow-up to radiographic resolution is recommended in 4-6 weeks to document resolution of presumed pneumonia and to exclude underlying nodule.  Original Report Authenticated By: Harrel Lemon, M.D.    Brief H and P: For complete details  please refer to admission H and P, but in brief 71 y/o female with hx of DM, COPD, CAD ESRD on HD, recent NSTEMI s/p cath, CHF, HTN, anemia admitted with pneumonia . Patient was at  hemodialysis  when they noted that she had rhonchi. She was sent to the ER, where chest x-rays were done revealing pneumonia.Patient denies any shortness of breath. She states she did have a cough that was essentially nonproductive.   Physical Exam on Discharge:  Filed Vitals:   02/26/12 2150 02/26/12 2245 02/27/12 0533 02/27/12 0922  BP: 125/62 125/59 147/66 123/58  Pulse: 77 75 73 76  Temp: 97.3 F (36.3 C)  97.6 F (36.4 C) 97.5 F (36.4 C)  TempSrc: Oral  Oral Oral  Resp: 20  20 17   Height: 5\' 5"  (1.651 m)     Weight: 59 kg (130 lb 1.1 oz)     SpO2: 96%  97% 97%     Intake/Output Summary (Last 24 hours) at 02/27/12 1035 Last data filed at 02/27/12 1610  Gross per 24 hour  Intake   1253 ml  Output      0 ml  Net   1253 ml    General elderly female in no acute distress.  HEENT: no pallor, no icterus, moist oral mucosa, no JVD, no lymphadenopathy  Heart: Normal s1 &s2 Regular rate and rhythm, without murmurs, rubs, gallops.  Lungs: Coarse breath sounds b/l, rt sided permcath  Abdomen: Soft, nontender, nondistended, positive bowel sounds.  Extremities: No clubbing cyanosis or edema with positive pedal pulses.  Neuro: Alert, awake, oriented x3, nonfocal.  CBC:    Component Value Date/Time   WBC 7.7 02/25/2012 0748   HGB 10.6* 02/25/2012 0748   HCT 33.6* 02/25/2012 0748   PLT 264 02/25/2012 0748   MCV 91.8 02/25/2012 0748   NEUTROABS 23.4* 02/06/2012 0407   LYMPHSABS 0.6* 02/06/2012 0407   MONOABS 0.7 02/06/2012 0407   EOSABS 0.0 02/06/2012 0407   BASOSABS 0.1 02/06/2012 0407    Basic Metabolic Panel:    Component Value Date/Time   NA 132* 02/25/2012 0748   K 3.2* 02/25/2012 0748   CL 90* 02/25/2012 0748   CO2 27 02/25/2012 0748   BUN 42* 02/25/2012 0748   CREATININE 4.09* 02/25/2012 0748   GLUCOSE  96 02/25/2012 0748   CALCIUM 8.4 02/25/2012 0748    Hospital Course:   ?Health care associated Pneumonia  chest x-ray not clearly suggestive for pneumonia. Continue  vancomycin for now with HD. Give 4 days of IV vanco and ertapnem. Will discharge on 1 more day of po levofloxacin to complete a 5 day course. Cont duonebs and mucinex  Patient had 1/2 positive GPC in blood cx and was contaminant with coagulase negative    ESBL UTI  multiple episodes with multiple hospitalizations . Last cx from 3/25 and appears patient completed abx course with meropenem .  COPD on home 02:  on 3 L of oxygen at home. 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 BP actually on lower side for past few days. Will reduce amlodipine dose upon discharge   ESRD (end stage renal disease) on dialysis  Receiving HD as scheduled   Patient clinically stable and can be discharged home . Will resume home services upon discharge  Time spent on Discharge: 45 MINUTES  Signed: Tracie Lindbloom 02/27/2012, 10:35 AM

## 2012-02-27 NOTE — Progress Notes (Signed)
ANTICOAGULATION CONSULT NOTE - Follow Up Consult  Pharmacy Consult for Coumadin Indication: PE / DVT history  Allergies  Allergen Reactions  . Novocain Other (See Comments)    Unknown    Patient Measurements: Height: 5\' 5"  (165.1 cm) Weight: 130 lb 1.1 oz (59 kg) IBW/kg (Calculated) : 57   Vital Signs: Temp: 97.5 F (36.4 C) (04/26 0922) Temp src: Oral (04/26 0922) BP: 123/58 mmHg (04/26 0922) Pulse Rate: 76  (04/26 0922)  Labs:  Basename 02/27/12 0550 02/25/12 0748  HGB -- 10.6*  HCT -- 33.6*  PLT -- 264  APTT -- --  LABPROT 31.8* 24.0*  INR 3.02* 2.11*  HEPARINUNFRC -- --  CREATININE -- 4.09*  CKTOTAL -- --  CKMB -- --  TROPONINI -- --   Estimated Creatinine Clearance: 11.8 ml/min (by C-G formula based on Cr of 4.09).  Assessment: 71 year old on Coumadin for PE/DVT history PTA. INR up to 3.02 Home dose = 3 mg daily  Goal of Therapy:  INR = 2 to 3   Plan:  1) Hold Coumadin dose today, then resume 3 mg daily tomorrow 2) Continue daily INR  [name] [name] 02/27/2012,9:51 AM

## 2012-02-28 LAB — TROPONIN T: Troponin T TROPT: 0.43 ng/mL — ABNORMAL HIGH (ref <0.01)

## 2012-03-01 LAB — CULTURE, BLOOD (ROUTINE X 2)
Culture  Setup Time: 201304230413
Culture: NO GROWTH

## 2012-03-03 LAB — CULTURE, BLOOD (ROUTINE X 2)
Culture  Setup Time: 201304251855
Culture: NO GROWTH
Culture: NO GROWTH

## 2012-03-25 ENCOUNTER — Other Ambulatory Visit: Payer: Self-pay

## 2012-03-25 DIAGNOSIS — T82898A Other specified complication of vascular prosthetic devices, implants and grafts, initial encounter: Secondary | ICD-10-CM

## 2012-03-26 ENCOUNTER — Encounter: Payer: Self-pay | Admitting: Vascular Surgery

## 2012-03-30 ENCOUNTER — Ambulatory Visit (INDEPENDENT_AMBULATORY_CARE_PROVIDER_SITE_OTHER): Payer: Medicare Other | Admitting: Vascular Surgery

## 2012-03-30 ENCOUNTER — Encounter: Payer: Self-pay | Admitting: Vascular Surgery

## 2012-03-30 ENCOUNTER — Other Ambulatory Visit: Payer: Self-pay | Admitting: *Deleted

## 2012-03-30 VITALS — BP 151/71 | HR 86 | Resp 18 | Ht 65.0 in | Wt 136.0 lb

## 2012-03-30 DIAGNOSIS — N186 End stage renal disease: Secondary | ICD-10-CM

## 2012-03-30 DIAGNOSIS — T82898A Other specified complication of vascular prosthetic devices, implants and grafts, initial encounter: Secondary | ICD-10-CM

## 2012-03-30 DIAGNOSIS — Z48812 Encounter for surgical aftercare following surgery on the circulatory system: Secondary | ICD-10-CM

## 2012-03-30 NOTE — Progress Notes (Signed)
UE arterial Doppler for Steal syndrome performed 03/30/2012 @ [facility]

## 2012-03-30 NOTE — Progress Notes (Signed)
Patient has today with a severe steal syndrome symptoms in her right hand. She had a basilic vein transposition by Dr. Hart Rochester on 10/31/2011. She had been using hemodialysis catheter. She recently was using the basilic vein transposition for access and has had significant infiltration. Currently the fistula is patent but they are continue to use the hemodialysis catheter for dialysis. She does have severe typical steal symptoms. Her right hand is cold cyanotic and painful. She reports this is worse on hemodialysis.  Past Medical History  Diagnosis Date  . Diabetes mellitus   . Hypercholesterolemia   . Hypothyroidism   . Colitis   . Chronic kidney disease   . Coronary artery disease   . Hypertension   . GERD (gastroesophageal reflux disease)   . Myocardial infarction   . Peripheral vascular disease   . COPD (chronic obstructive pulmonary disease)   . Pneumonia   . Angina   . Shortness of breath   . H/O Clostridium difficile infection   . Hx of pulmonary embolus     History  Substance Use Topics  . Smoking status: Former Smoker -- 1.0 packs/day for 40 years    Types: Cigarettes    Quit date: 06/11/2011  . Smokeless tobacco: Never Used  . Alcohol Use: No    Family History  Problem Relation Age of Onset  . Diabetes type II    . Hypertension    . Diabetes Mother   . Kidney disease Mother   . Diabetes Father     Allergies  Allergen Reactions  . Procaine Hcl Other (See Comments)    Unknown    Current outpatient prescriptions:albuterol (PROVENTIL) (5 MG/ML) 0.5% nebulizer solution, Take 2.5 mg by nebulization every 4 (four) hours as needed. For wheezing/shortness of breath., Disp: , Rfl: ;  amLODipine (NORVASC) 2.5 MG tablet, Take 1 tablet (2.5 mg total) by mouth at bedtime., Disp: 30 tablet, Rfl: 0;  aspirin 81 MG chewable tablet, Chew 81 mg by mouth daily., Disp: , Rfl:  calcium acetate (PHOSLO) 667 MG capsule, Take 667 mg by mouth 3 (three) times daily with meals., Disp: ,  Rfl: ;  cloNIDine (CATAPRES) 0.2 MG tablet, Take 0.2 mg by mouth 3 (three) times daily.  , Disp: , Rfl: ;  darbepoetin (ARANESP) 60 MCG/0.3ML SOLN, Inject 60 mcg into the vein every Friday with hemodialysis., Disp: , Rfl: ;  insulin glargine (LANTUS) 100 UNIT/ML injection, Inject 18 Units into the skin at bedtime., Disp: , Rfl:  isosorbide mononitrate (IMDUR) 120 MG 24 hr tablet, Take 120 mg by mouth daily.  , Disp: , Rfl: ;  levothyroxine (SYNTHROID, LEVOTHROID) 25 MCG tablet, Take 25 mcg by mouth daily.  , Disp: , Rfl: ;  metoprolol tartrate (LOPRESSOR) 25 MG tablet, Take 25 mg by mouth 2 (two) times daily., Disp: , Rfl: ;  nitroGLYCERIN (NITROSTAT) 0.4 MG SL tablet, Place 0.4 mg under the tongue every 5 (five) minutes as needed. For chest pain. , Disp: , Rfl:  oxyCODONE-acetaminophen (PERCOCET) 5-325 MG per tablet, Take 1 tablet by mouth every 6 (six) hours as needed. For pain., Disp: , Rfl: ;  pantoprazole (PROTONIX) 40 MG tablet, Take 40 mg by mouth daily.  , Disp: , Rfl: ;  Probiotic Product (PROBIOTIC PO), Take 1 capsule by mouth daily., Disp: , Rfl: ;  simvastatin (ZOCOR) 10 MG tablet, Take 20 mg by mouth daily at 6 PM., Disp: , Rfl:  sodium chloride 0.9 % SOLN 100 mL with ferric gluconate 12.5 MG/ML SOLN  125 mg, Inject 62.5 mg into the vein every Wednesday with hemodialysis., Disp: , Rfl: ;  warfarin (COUMADIN) 2 MG tablet, Take 3 mg by mouth daily., Disp: , Rfl: ;  DISCONTD: enoxaparin (LOVENOX) 60 MG/0.6ML SOLN, Inject 0.55 mLs (55 mg total) into the skin daily., Disp: , Rfl:   BP 151/71  Pulse 86  Resp 18  Ht 5\' 5"  (1.651 m)  Wt 136 lb (61.689 kg)  BMI 22.63 kg/m2  Body mass index is 22.63 kg/(m^2).       Physical exam well-developed well-nourished white female no acute distress he does have a palpable right radial pulse but this is markedly augmented with compression of the basilic vein transposition fistula. Her surgical incisions are all well healed. She does have marked bruising in  her right upper arm from infiltration of the fistula. She does have 2-3+ left radial pulse.  Vascular lab study revealed dampened digital pressures on the right compared to the left she does have triphasic radial and on her waveforms. She did have augmentation of pressure with compression of the graft.  Impression and plan: Symptomatic steal syndrome and right hand. I have recommended ligation of her right basilic vein fistula. I do not feel that she can tolerate this level of ischemia. I would recommend placement of a left arm graft. She is right-handed and preoperative ultrasound revealed inadequate veins on the left for fistula attempt

## 2012-04-01 ENCOUNTER — Encounter (HOSPITAL_COMMUNITY): Payer: Self-pay | Admitting: Respiratory Therapy

## 2012-04-05 ENCOUNTER — Encounter (HOSPITAL_COMMUNITY): Payer: Self-pay | Admitting: *Deleted

## 2012-04-05 MED ORDER — SODIUM CHLORIDE 0.9 % IV SOLN
INTRAVENOUS | Status: DC
  Administered 2012-04-06: 10:00:00 via INTRAVENOUS

## 2012-04-05 MED ORDER — DEXTROSE 5 % IV SOLN
1.5000 g | INTRAVENOUS | Status: AC
  Administered 2012-04-06: 1.5 g via INTRAVENOUS
  Filled 2012-04-05: qty 1.5

## 2012-04-06 ENCOUNTER — Encounter (HOSPITAL_COMMUNITY)
Admission: RE | Disposition: A | Discharge status: Home / Self Care | Payer: Self-pay | Source: Ambulatory Visit | Attending: Vascular Surgery

## 2012-04-06 ENCOUNTER — Ambulatory Visit (HOSPITAL_COMMUNITY): Payer: Medicare Other | Admitting: Anesthesiology

## 2012-04-06 ENCOUNTER — Encounter (HOSPITAL_COMMUNITY): Payer: Self-pay | Admitting: Anesthesiology

## 2012-04-06 ENCOUNTER — Ambulatory Visit (HOSPITAL_COMMUNITY): Payer: Medicare Other

## 2012-04-06 ENCOUNTER — Ambulatory Visit (HOSPITAL_COMMUNITY)
Admission: RE | Admit: 2012-04-06 | Discharge: 2012-04-06 | Disposition: A | Discharge status: Home / Self Care | Payer: Medicare Other | Source: Ambulatory Visit | Attending: Vascular Surgery | Admitting: Vascular Surgery

## 2012-04-06 ENCOUNTER — Encounter (HOSPITAL_COMMUNITY): Payer: Self-pay | Admitting: *Deleted

## 2012-04-06 ENCOUNTER — Telehealth: Payer: Self-pay | Admitting: Vascular Surgery

## 2012-04-06 DIAGNOSIS — N189 Chronic kidney disease, unspecified: Secondary | ICD-10-CM | POA: Insufficient documentation

## 2012-04-06 DIAGNOSIS — K08109 Complete loss of teeth, unspecified cause, unspecified class: Secondary | ICD-10-CM | POA: Insufficient documentation

## 2012-04-06 DIAGNOSIS — G458 Other transient cerebral ischemic attacks and related syndromes: Secondary | ICD-10-CM

## 2012-04-06 DIAGNOSIS — E119 Type 2 diabetes mellitus without complications: Secondary | ICD-10-CM | POA: Insufficient documentation

## 2012-04-06 DIAGNOSIS — I129 Hypertensive chronic kidney disease with stage 1 through stage 4 chronic kidney disease, or unspecified chronic kidney disease: Secondary | ICD-10-CM | POA: Insufficient documentation

## 2012-04-06 DIAGNOSIS — I209 Angina pectoris, unspecified: Secondary | ICD-10-CM | POA: Insufficient documentation

## 2012-04-06 DIAGNOSIS — Z87891 Personal history of nicotine dependence: Secondary | ICD-10-CM | POA: Insufficient documentation

## 2012-04-06 DIAGNOSIS — I2510 Atherosclerotic heart disease of native coronary artery without angina pectoris: Secondary | ICD-10-CM | POA: Insufficient documentation

## 2012-04-06 DIAGNOSIS — J449 Chronic obstructive pulmonary disease, unspecified: Secondary | ICD-10-CM | POA: Insufficient documentation

## 2012-04-06 DIAGNOSIS — N186 End stage renal disease: Secondary | ICD-10-CM

## 2012-04-06 DIAGNOSIS — J4489 Other specified chronic obstructive pulmonary disease: Secondary | ICD-10-CM | POA: Insufficient documentation

## 2012-04-06 DIAGNOSIS — T82898A Other specified complication of vascular prosthetic devices, implants and grafts, initial encounter: Secondary | ICD-10-CM | POA: Insufficient documentation

## 2012-04-06 DIAGNOSIS — K219 Gastro-esophageal reflux disease without esophagitis: Secondary | ICD-10-CM | POA: Insufficient documentation

## 2012-04-06 DIAGNOSIS — I252 Old myocardial infarction: Secondary | ICD-10-CM | POA: Insufficient documentation

## 2012-04-06 DIAGNOSIS — Y832 Surgical operation with anastomosis, bypass or graft as the cause of abnormal reaction of the patient, or of later complication, without mention of misadventure at the time of the procedure: Secondary | ICD-10-CM | POA: Insufficient documentation

## 2012-04-06 DIAGNOSIS — E039 Hypothyroidism, unspecified: Secondary | ICD-10-CM | POA: Insufficient documentation

## 2012-04-06 DIAGNOSIS — I509 Heart failure, unspecified: Secondary | ICD-10-CM | POA: Insufficient documentation

## 2012-04-06 LAB — POCT I-STAT 4, (NA,K, GLUC, HGB,HCT)
Glucose, Bld: 252 mg/dL — ABNORMAL HIGH (ref 70–99)
HCT: 43 % (ref 36.0–46.0)
Potassium: 4.7 mEq/L (ref 3.5–5.1)
Sodium: 135 mEq/L (ref 135–145)

## 2012-04-06 LAB — SURGICAL PCR SCREEN
MRSA, PCR: NEGATIVE
Staphylococcus aureus: NEGATIVE

## 2012-04-06 LAB — GLUCOSE, CAPILLARY: Glucose-Capillary: 234 mg/dL — ABNORMAL HIGH (ref 70–99)

## 2012-04-06 LAB — PROTIME-INR: Prothrombin Time: 13.3 seconds (ref 11.6–15.2)

## 2012-04-06 LAB — APTT: aPTT: 27 seconds (ref 24–37)

## 2012-04-06 SURGERY — LIGATION OF ARTERIOVENOUS  FISTULA
Anesthesia: General | Site: Arm Lower | Laterality: Right | Wound class: Clean

## 2012-04-06 MED ORDER — MUPIROCIN 2 % EX OINT
TOPICAL_OINTMENT | CUTANEOUS | Status: AC
  Filled 2012-04-06: qty 22

## 2012-04-06 MED ORDER — MUPIROCIN 2 % EX OINT
TOPICAL_OINTMENT | Freq: Two times a day (BID) | CUTANEOUS | Status: DC
  Administered 2012-04-06: 08:00:00 via NASAL

## 2012-04-06 MED ORDER — OXYCODONE HCL 5 MG PO TABS: 5.0000 mg | ORAL_TABLET | ORAL | Status: AC | PRN

## 2012-04-06 MED ORDER — MIDAZOLAM HCL 5 MG/5ML IJ SOLN
INTRAMUSCULAR | Status: DC | PRN
  Administered 2012-04-06: 1 mg via INTRAVENOUS

## 2012-04-06 MED ORDER — 0.9 % SODIUM CHLORIDE (POUR BTL) OPTIME
TOPICAL | Status: DC | PRN
  Administered 2012-04-06: 1000 mL

## 2012-04-06 MED ORDER — BUPIVACAINE HCL (PF) 0.5 % IJ SOLN
INTRAMUSCULAR | Status: DC | PRN
  Administered 2012-04-06: 4 mL

## 2012-04-06 MED ORDER — LIDOCAINE-EPINEPHRINE (PF) 1 %-1:200000 IJ SOLN
INTRAMUSCULAR | Status: DC | PRN
  Administered 2012-04-06: 4 mL

## 2012-04-06 MED ORDER — SODIUM CHLORIDE 0.9 % IV SOLN
INTRAVENOUS | Status: DC | PRN
  Administered 2012-04-06: 10:00:00 via INTRAVENOUS

## 2012-04-06 MED ORDER — FENTANYL CITRATE 0.05 MG/ML IJ SOLN
INTRAMUSCULAR | Status: DC | PRN
  Administered 2012-04-06: 50 ug via INTRAVENOUS

## 2012-04-06 MED ORDER — PROPOFOL 10 MG/ML IV EMUL
INTRAVENOUS | Status: DC | PRN
  Administered 2012-04-06: 75 ug/kg/min via INTRAVENOUS

## 2012-04-06 MED ORDER — ONDANSETRON HCL 4 MG/2ML IJ SOLN
INTRAMUSCULAR | Status: DC | PRN
  Administered 2012-04-06: 4 mg via INTRAVENOUS

## 2012-04-06 MED ORDER — PHENYLEPHRINE HCL 10 MG/ML IJ SOLN
INTRAMUSCULAR | Status: DC | PRN
  Administered 2012-04-06: 80 ug via INTRAVENOUS
  Administered 2012-04-06: 120 ug via INTRAVENOUS
  Administered 2012-04-06: 80 ug via INTRAVENOUS

## 2012-04-06 SURGICAL SUPPLY — 43 items
CANISTER SUCTION 2500CC (MISCELLANEOUS) ×3 IMPLANT
CLIP TI MEDIUM 6 (CLIP) ×3 IMPLANT
CLIP TI WIDE RED SMALL 6 (CLIP) ×3 IMPLANT
CLOTH BEACON ORANGE TIMEOUT ST (SAFETY) ×3 IMPLANT
COVER SURGICAL LIGHT HANDLE (MISCELLANEOUS) ×6 IMPLANT
DECANTER SPIKE VIAL GLASS SM (MISCELLANEOUS) IMPLANT
DERMABOND ADVANCED (GAUZE/BANDAGES/DRESSINGS) ×1
DERMABOND ADVANCED .7 DNX12 (GAUZE/BANDAGES/DRESSINGS) ×2 IMPLANT
ELECT REM PT RETURN 9FT ADLT (ELECTROSURGICAL) ×3
ELECTRODE REM PT RTRN 9FT ADLT (ELECTROSURGICAL) ×2 IMPLANT
GEL ULTRASOUND 20GR AQUASONIC (MISCELLANEOUS) ×3 IMPLANT
GLOVE BIO SURGEON STRL SZ7 (GLOVE) ×3 IMPLANT
GLOVE BIOGEL PI IND STRL 6.5 (GLOVE) ×2 IMPLANT
GLOVE BIOGEL PI IND STRL 7.0 (GLOVE) ×2 IMPLANT
GLOVE BIOGEL PI IND STRL 7.5 (GLOVE) ×2 IMPLANT
GLOVE BIOGEL PI INDICATOR 6.5 (GLOVE) ×1
GLOVE BIOGEL PI INDICATOR 7.0 (GLOVE) ×1
GLOVE BIOGEL PI INDICATOR 7.5 (GLOVE) ×1
GLOVE ECLIPSE 6.5 STRL STRAW (GLOVE) ×3 IMPLANT
GOWN STRL NON-REIN LRG LVL3 (GOWN DISPOSABLE) ×6 IMPLANT
HEMOSTAT SURGICEL 2X14 (HEMOSTASIS) IMPLANT
KIT BASIN OR (CUSTOM PROCEDURE TRAY) ×3 IMPLANT
KIT ROOM TURNOVER OR (KITS) ×3 IMPLANT
NS IRRIG 1000ML POUR BTL (IV SOLUTION) ×3 IMPLANT
PACK CV ACCESS (CUSTOM PROCEDURE TRAY) ×3 IMPLANT
PAD ARMBOARD 7.5X6 YLW CONV (MISCELLANEOUS) ×6 IMPLANT
SPONGE GAUZE 4X4 12PLY (GAUZE/BANDAGES/DRESSINGS) ×3 IMPLANT
SPONGE SURGIFOAM ABS GEL 100 (HEMOSTASIS) IMPLANT
SUT ETHILON 3 0 PS 1 (SUTURE) IMPLANT
SUT MNCRL AB 4-0 PS2 18 (SUTURE) ×3 IMPLANT
SUT PROLENE 5 0 C 1 24 (SUTURE) ×3 IMPLANT
SUT PROLENE 6 0 BV (SUTURE) ×3 IMPLANT
SUT PROLENE 7 0 BV 1 (SUTURE) IMPLANT
SUT SILK 0 TIES 10X30 (SUTURE) ×3 IMPLANT
SUT SILK 2 0 FS (SUTURE) IMPLANT
SUT VIC AB 3-0 SH 27 (SUTURE) ×1
SUT VIC AB 3-0 SH 27X BRD (SUTURE) ×2 IMPLANT
SWAB COLLECTION DEVICE MRSA (MISCELLANEOUS) IMPLANT
TOWEL OR 17X24 6PK STRL BLUE (TOWEL DISPOSABLE) ×3 IMPLANT
TOWEL OR 17X26 10 PK STRL BLUE (TOWEL DISPOSABLE) ×3 IMPLANT
TUBE ANAEROBIC SPECIMEN COL (MISCELLANEOUS) IMPLANT
UNDERPAD 30X30 INCONTINENT (UNDERPADS AND DIAPERS) ×3 IMPLANT
WATER STERILE IRR 1000ML POUR (IV SOLUTION) IMPLANT

## 2012-04-06 NOTE — Anesthesia Preprocedure Evaluation (Addendum)
Anesthesia Evaluation  Patient identified by MRN, date of birth, ID band Patient awake    Reviewed: Allergy & Precautions, H&P , NPO status , Patient's Chart, lab work & pertinent test results, reviewed documented beta blocker date and time   Airway Mallampati: I TM Distance: >3 FB Neck ROM: Full    Dental  (+) Edentulous Upper, Edentulous Lower and Dental Advisory Given   Pulmonary shortness of breath, with exertion and at rest, COPD COPD inhaler, former smoker   Pulmonary exam normal       Cardiovascular hypertension, Pt. on home beta blockers and Pt. on medications + angina + CAD, + Past MI and +CHF  EF=34% 02/11/2012   Neuro/Psych    GI/Hepatic GERD-  Medicated,  Endo/Other  Diabetes mellitus-Hypothyroidism   Renal/GU CRFRenal disease     Musculoskeletal   Abdominal   Peds  Hematology   Anesthesia Other Findings   Reproductive/Obstetrics                         Anesthesia Physical Anesthesia Plan  ASA: III  Anesthesia Plan: General and MAC   Post-op Pain Management:    Induction: Intravenous  Airway Management Planned: Simple Face Mask  Additional Equipment:   Intra-op Plan:   Post-operative Plan: Extubation in OR  Informed Consent: I have reviewed the patients History and Physical, chart, labs and discussed the procedure including the risks, benefits and alternatives for the proposed anesthesia with the patient or authorized representative who has indicated his/her understanding and acceptance.   Dental advisory given  Plan Discussed with: CRNA, Anesthesiologist and Surgeon  Anesthesia Plan Comments:        Anesthesia Quick Evaluation

## 2012-04-06 NOTE — Interval H&P Note (Signed)
Addendum  After further discussion with patient, we will proceed with the ligation of the right BVT and let her recover from the steal in her right arm.  If in in two weeks, her sx are improved.  I will proceed with the left AVF vs AVG placement.  The rationale is to avoid possible steal in both arms at the same time, in case she does not recover full neurologic function in the right hand.  Leonides Sake, MD [facility] Office: [phone] Pager: [phone]  04/06/2012, 9:48 AM

## 2012-04-06 NOTE — Interval H&P Note (Signed)
[facility]  History and Physical Update  The patient was interviewed and re-examined.  The patient's previous History and Physical has been reviewed and is unchanged.  There is no change in the plan of care: ligation of right AVF, placement of L AVF vs AVG  Leonides Sake, MD [facility] Office: [phone] Pager: [phone]  04/06/2012, 7:18 AM

## 2012-04-06 NOTE — Progress Notes (Signed)
Called to PACU 8 to stop iv fluids to hemocath; blue port flushed w. 10cc ns, followed by 2.4cc heparin (1000 units/ml); both ports capped and clamped then taped for safety;  Pt for hemo dialysis Wednesday.   Barkley Bruns RN IV Team

## 2012-04-06 NOTE — Progress Notes (Signed)
Right HD IJ cath accessed on venous side after withdrawal of 10 ml

## 2012-04-06 NOTE — H&P (View-Only) (Signed)
Patient has today with a severe steal syndrome symptoms in her right hand. She had a basilic vein transposition by Dr. Lawson on 10/31/2011. She had been using hemodialysis catheter. She recently was using the basilic vein transposition for access and has had significant infiltration. Currently the fistula is patent but they are continue to use the hemodialysis catheter for dialysis. She does have severe typical steal symptoms. Her right hand is cold cyanotic and painful. She reports this is worse on hemodialysis.  Past Medical History  Diagnosis Date  . Diabetes mellitus   . Hypercholesterolemia   . Hypothyroidism   . Colitis   . Chronic kidney disease   . Coronary artery disease   . Hypertension   . GERD (gastroesophageal reflux disease)   . Myocardial infarction   . Peripheral vascular disease   . COPD (chronic obstructive pulmonary disease)   . Pneumonia   . Angina   . Shortness of breath   . H/O Clostridium difficile infection   . Hx of pulmonary embolus     History  Substance Use Topics  . Smoking status: Former Smoker -- 1.0 packs/day for 40 years    Types: Cigarettes    Quit date: 06/11/2011  . Smokeless tobacco: Never Used  . Alcohol Use: No    Family History  Problem Relation Age of Onset  . Diabetes type II    . Hypertension    . Diabetes Mother   . Kidney disease Mother   . Diabetes Father     Allergies  Allergen Reactions  . Procaine Hcl Other (See Comments)    Unknown    Current outpatient prescriptions:albuterol (PROVENTIL) (5 MG/ML) 0.5% nebulizer solution, Take 2.5 mg by nebulization every 4 (four) hours as needed. For wheezing/shortness of breath., Disp: , Rfl: ;  amLODipine (NORVASC) 2.5 MG tablet, Take 1 tablet (2.5 mg total) by mouth at bedtime., Disp: 30 tablet, Rfl: 0;  aspirin 81 MG chewable tablet, Chew 81 mg by mouth daily., Disp: , Rfl:  calcium acetate (PHOSLO) 667 MG capsule, Take 667 mg by mouth 3 (three) times daily with meals., Disp: ,  Rfl: ;  cloNIDine (CATAPRES) 0.2 MG tablet, Take 0.2 mg by mouth 3 (three) times daily.  , Disp: , Rfl: ;  darbepoetin (ARANESP) 60 MCG/0.3ML SOLN, Inject 60 mcg into the vein every Friday with hemodialysis., Disp: , Rfl: ;  insulin glargine (LANTUS) 100 UNIT/ML injection, Inject 18 Units into the skin at bedtime., Disp: , Rfl:  isosorbide mononitrate (IMDUR) 120 MG 24 hr tablet, Take 120 mg by mouth daily.  , Disp: , Rfl: ;  levothyroxine (SYNTHROID, LEVOTHROID) 25 MCG tablet, Take 25 mcg by mouth daily.  , Disp: , Rfl: ;  metoprolol tartrate (LOPRESSOR) 25 MG tablet, Take 25 mg by mouth 2 (two) times daily., Disp: , Rfl: ;  nitroGLYCERIN (NITROSTAT) 0.4 MG SL tablet, Place 0.4 mg under the tongue every 5 (five) minutes as needed. For chest pain. , Disp: , Rfl:  oxyCODONE-acetaminophen (PERCOCET) 5-325 MG per tablet, Take 1 tablet by mouth every 6 (six) hours as needed. For pain., Disp: , Rfl: ;  pantoprazole (PROTONIX) 40 MG tablet, Take 40 mg by mouth daily.  , Disp: , Rfl: ;  Probiotic Product (PROBIOTIC PO), Take 1 capsule by mouth daily., Disp: , Rfl: ;  simvastatin (ZOCOR) 10 MG tablet, Take 20 mg by mouth daily at 6 PM., Disp: , Rfl:  sodium chloride 0.9 % SOLN 100 mL with ferric gluconate 12.5 MG/ML SOLN   125 mg, Inject 62.5 mg into the vein every Wednesday with hemodialysis., Disp: , Rfl: ;  warfarin (COUMADIN) 2 MG tablet, Take 3 mg by mouth daily., Disp: , Rfl: ;  DISCONTD: enoxaparin (LOVENOX) 60 MG/0.6ML SOLN, Inject 0.55 mLs (55 mg total) into the skin daily., Disp: , Rfl:   BP 151/71  Pulse 86  Resp 18  Ht 5' 5" (1.651 m)  Wt 136 lb (61.689 kg)  BMI 22.63 kg/m2  Body mass index is 22.63 kg/(m^2).       Physical exam well-developed well-nourished white female no acute distress he does have a palpable right radial pulse but this is markedly augmented with compression of the basilic vein transposition fistula. Her surgical incisions are all well healed. She does have marked bruising in  her right upper arm from infiltration of the fistula. She does have 2-3+ left radial pulse.  Vascular lab study revealed dampened digital pressures on the right compared to the left she does have triphasic radial and on her waveforms. She did have augmentation of pressure with compression of the graft.  Impression and plan: Symptomatic steal syndrome and right hand. I have recommended ligation of her right basilic vein fistula. I do not feel that she can tolerate this level of ischemia. I would recommend placement of a left arm graft. She is right-handed and preoperative ultrasound revealed inadequate veins on the left for fistula attempt 

## 2012-04-06 NOTE — Preoperative (Signed)
Beta Blockers   Reason not to administer Beta Blockers:Not Applicable 

## 2012-04-06 NOTE — Anesthesia Postprocedure Evaluation (Signed)
Anesthesia Post Note  Patient: [name] [name]  Procedure(s) Performed: Procedure(s) (LRB): LIGATION OF ARTERIOVENOUS  FISTULA (Right)  Anesthesia type: general  Patient location: PACU  Post pain: Pain level controlled  Post assessment: Patient's Cardiovascular Status Stable  Last Vitals:  Filed Vitals:   04/06/12 1134  BP:   Pulse: 71  Temp:   Resp: 13    Post vital signs: Reviewed and stable  Level of consciousness: sedated  Complications: No apparent anesthesia complications

## 2012-04-06 NOTE — Anesthesia Procedure Notes (Signed)
Procedure Name: MAC Date/Time: 04/06/2012 9:50 AM Performed by: Jerilee Hoh Pre-anesthesia Checklist: Patient identified, Emergency Drugs available, Suction available and Patient being monitored Patient Re-evaluated:Patient Re-evaluated prior to inductionOxygen Delivery Method: Simple face mask Placement Confirmation: positive ETCO2

## 2012-04-06 NOTE — Transfer of Care (Signed)
Immediate Anesthesia Transfer of Care Note  Patient: [name] [name]  Procedure(s) Performed: Procedure(s) (LRB): LIGATION OF ARTERIOVENOUS  FISTULA (Right)  Patient Location: PACU  Anesthesia Type: MAC  Level of Consciousness: awake, alert , oriented and patient cooperative  Airway & Oxygen Therapy: Patient Spontanous Breathing and Patient connected to nasal cannula oxygen  Post-op Assessment: Report given to PACU RN, Post -op Vital signs reviewed and stable and Patient moving all extremities  Post vital signs: Reviewed and stable  Complications: No apparent anesthesia complications

## 2012-04-06 NOTE — Telephone Encounter (Addendum)
Message copied by Rosalyn Charters on Tue Apr 06, 2012 12:21 PM ------      Message from: Melene Plan      Created: Tue Apr 06, 2012 11:42 AM                   ----- Message -----         From: Fransisco Hertz, MD         Sent: 04/06/2012  11:06 AM           To: Almetta Lovely, RN            DAVID TOWSON      [phone]      17-Nov-1940            Procedure: R BVT ligation            Follow-up: 2 weeks  NOTIFIED PT. OF FU APPT. 04/23/2012 1:45 pm

## 2012-04-06 NOTE — Op Note (Signed)
OPERATIVE NOTE   PROCEDURE: 1. Ligation of right basilic vein transposition   PRE-OPERATIVE DIAGNOSIS: Right arm steal syndrome  POST-OPERATIVE DIAGNOSIS: same as above   SURGEON: Leonides Sake, MD  ANESTHESIA: local and MAC  ESTIMATED BLOOD LOSS: 30 cc  FINDING(S): 1. Palpable radial pulse at end of case  SPECIMEN(S):  none  INDICATIONS:   [name] [name] is a 71 y.o. female who presents with severe right arm steal symptoms.  She was evaluated in the office by Dr. Arbie Cookey and found to have significant steal symptoms and arterial studies consistent with such.  She was originally scheduled for ligation of the right basilic vein transposition and possible placement of a left arteriovenous fistula vs arteriovenous graft.  We discussed the possibility of having steal symptoms in both arms at the same time if she developed left arm steal after the new access.  Subsequently, she decided on proceeding with the right basilic vein transposition ligation and seeing if her symptoms resolved before proceeding with left arm access placement.  Risk, benefits, and alternatives to access surgery were discussed.  The patient is aware the risks include but are not limited to: bleeding, infection, steal syndrome, nerve damage, ischemic monomelic neuropathy, failure to mature, need for additional procedures, death and stroke.  The patient agrees to proceed forward with the procedure.   DESCRIPTION: After obtain full informed written consent, the patient was brought back to the operating room and placed supine upon the operating table.  The patient received IV antibiotics prior to induction.  After obtaining adequate anesthesia, the patient was prepped and draped in the standard fashion for: right arm access procedure.  I injected 5 cc of a 1:1 mixture of 1% lidocaine with epinephrine and 0.5% Marcaine without epinephrine at the previous antecubital incision.  I made an incision over the anastomosis and dissected down  to the fistula.  I was able to dissect out the fistula just distal to the anastomosis.  I clamped the fistula to verify I had dissected out the fistula and not the artery as the fistula was very pulsatile at this level.  There remained a dopplerable radial and ulnar signal, which was improved from prior to the clamping.  I ligated this fistula with two 0 Silk ties and oversewed the distal portion of the fistula with a 6-0 Prolene, leaving a short segment of vein on the artery, allowing the vein to act as a patch angioplasty.  I remained a palpable brachial pulse and now there was a palpable radial pulse at the end of the case.  There was also a dopplerable ulnar signal.  I repproximated the subcutaneous tissue with 3-0 Vicryl and reapproximated the skin with a 4-0 Monocryl.  The skin was cleaned, dried, and reinforced with Dermabond.  COMPLICATIONS: none  CONDITION: stable  Leonides Sake, MD [facility] [place] Office: [phone] Pager: [phone]  04/06/2012, 10:55 AM

## 2012-04-22 ENCOUNTER — Encounter: Payer: Self-pay | Admitting: Vascular Surgery

## 2012-04-23 ENCOUNTER — Encounter: Payer: Self-pay | Admitting: *Deleted

## 2012-04-23 ENCOUNTER — Other Ambulatory Visit: Payer: Self-pay | Admitting: *Deleted

## 2012-04-23 ENCOUNTER — Ambulatory Visit (INDEPENDENT_AMBULATORY_CARE_PROVIDER_SITE_OTHER): Payer: Medicare Other | Admitting: Vascular Surgery

## 2012-04-23 ENCOUNTER — Encounter: Payer: Self-pay | Admitting: Vascular Surgery

## 2012-04-23 VITALS — BP 149/73 | HR 105 | Temp 97.9°F | Ht 65.0 in | Wt 134.0 lb

## 2012-04-23 DIAGNOSIS — N186 End stage renal disease: Secondary | ICD-10-CM

## 2012-04-23 NOTE — Progress Notes (Signed)
[facility] [place]  Postoperative Access Visit  History of Present Illness [name] [name] is a 71 y.o. year old female who presents for postoperative follow-up for: ligation of R BVT for steal syndrome (Date: 04/06/12).  The patient's wounds are healed.  The patient notes resolution of steal symptoms.  The patient is able to complete their activities of daily living.  The patient's current symptoms are: none.  Physical Examination  Filed Vitals:   04/23/12 1402  BP: 149/73  Pulse: 105  Temp: 97.9 F (36.6 C)   RUE: Incision is healed, skin feels warm, hand grip is 5/5, sensation in digits is intact, no palpable thrill, bruit cannot be auscultated   Medical Decision Making  [name] [name] is a 71 y.o. year old female who presents s/p R BVT ligation for steal syndrome.  Now that the patient has recovered from her steal sx in the right side, I think it is reasonable to place a new access in the left arm  Based the previous arterial duplex on the left arm, her brachial artery is adequate inflow with triphasic flow in this artery and the radial and ulnar arteries.  Hopefully this limits the risk of steal in this arm.  The basilic vein looks to be adequate on Sonosite imaging with 3 mm lumen so I think an attempt at a L 1st BVT vs L arm AVG is indicated.  Leonides Sake, MD [facility] [place] Office: [phone] Pager: [phone]

## 2012-05-10 ENCOUNTER — Encounter (HOSPITAL_COMMUNITY): Payer: Self-pay | Admitting: Pharmacy Technician

## 2012-05-17 ENCOUNTER — Encounter (HOSPITAL_COMMUNITY): Payer: Self-pay | Admitting: *Deleted

## 2012-05-17 MED ORDER — DEXTROSE 5 % IV SOLN
1.5000 g | INTRAVENOUS | Status: AC
  Administered 2012-05-18: 1.5 g via INTRAVENOUS
  Filled 2012-05-17: qty 1.5

## 2012-05-18 ENCOUNTER — Telehealth: Payer: Self-pay | Admitting: Vascular Surgery

## 2012-05-18 ENCOUNTER — Encounter (HOSPITAL_COMMUNITY): Payer: Self-pay | Admitting: Certified Registered"

## 2012-05-18 ENCOUNTER — Ambulatory Visit (HOSPITAL_COMMUNITY)
Admission: RE | Admit: 2012-05-18 | Discharge: 2012-05-18 | Disposition: A | Discharge status: Home / Self Care | Payer: Medicare Other | Source: Ambulatory Visit | Attending: Vascular Surgery | Admitting: Vascular Surgery

## 2012-05-18 ENCOUNTER — Ambulatory Visit (HOSPITAL_COMMUNITY): Payer: Medicare Other | Admitting: Certified Registered"

## 2012-05-18 ENCOUNTER — Encounter (HOSPITAL_COMMUNITY)
Admission: RE | Disposition: A | Discharge status: Home / Self Care | Payer: Self-pay | Source: Ambulatory Visit | Attending: Vascular Surgery

## 2012-05-18 DIAGNOSIS — I120 Hypertensive chronic kidney disease with stage 5 chronic kidney disease or end stage renal disease: Secondary | ICD-10-CM | POA: Insufficient documentation

## 2012-05-18 DIAGNOSIS — N186 End stage renal disease: Secondary | ICD-10-CM

## 2012-05-18 DIAGNOSIS — E7800 Pure hypercholesterolemia, unspecified: Secondary | ICD-10-CM | POA: Insufficient documentation

## 2012-05-18 DIAGNOSIS — I2510 Atherosclerotic heart disease of native coronary artery without angina pectoris: Secondary | ICD-10-CM | POA: Insufficient documentation

## 2012-05-18 DIAGNOSIS — E039 Hypothyroidism, unspecified: Secondary | ICD-10-CM | POA: Insufficient documentation

## 2012-05-18 DIAGNOSIS — E119 Type 2 diabetes mellitus without complications: Secondary | ICD-10-CM | POA: Insufficient documentation

## 2012-05-18 DIAGNOSIS — I739 Peripheral vascular disease, unspecified: Secondary | ICD-10-CM | POA: Insufficient documentation

## 2012-05-18 HISTORY — PX: BASCILIC VEIN TRANSPOSITION: SHX5742

## 2012-05-18 LAB — GLUCOSE, CAPILLARY: Glucose-Capillary: 256 mg/dL — ABNORMAL HIGH (ref 70–99)

## 2012-05-18 LAB — POCT I-STAT 4, (NA,K, GLUC, HGB,HCT)
Glucose, Bld: 285 mg/dL — ABNORMAL HIGH (ref 70–99)
Hemoglobin: 11.9 g/dL — ABNORMAL LOW (ref 12.0–15.0)
Potassium: 4.5 mEq/L (ref 3.5–5.1)

## 2012-05-18 LAB — SURGICAL PCR SCREEN: MRSA, PCR: NEGATIVE

## 2012-05-18 SURGERY — TRANSPOSITION, VEIN, BASILIC
Anesthesia: Monitor Anesthesia Care | Site: Arm Upper | Laterality: Left | Wound class: Clean

## 2012-05-18 MED ORDER — LIDOCAINE-EPINEPHRINE (PF) 1 %-1:200000 IJ SOLN
INTRAMUSCULAR | Status: AC
  Filled 2012-05-18: qty 10

## 2012-05-18 MED ORDER — SODIUM CHLORIDE 0.9 % IR SOLN
Status: DC | PRN
  Administered 2012-05-18: 10:00:00

## 2012-05-18 MED ORDER — DROPERIDOL 2.5 MG/ML IJ SOLN: 0.6250 mg | INTRAMUSCULAR | Status: DC | PRN

## 2012-05-18 MED ORDER — HYDROMORPHONE HCL PF 1 MG/ML IJ SOLN: 0.2500 mg | INTRAMUSCULAR | Status: DC | PRN

## 2012-05-18 MED ORDER — MIDAZOLAM HCL 5 MG/5ML IJ SOLN
INTRAMUSCULAR | Status: DC | PRN
  Administered 2012-05-18: 2 mg via INTRAVENOUS

## 2012-05-18 MED ORDER — LIDOCAINE-EPINEPHRINE (PF) 1 %-1:200000 IJ SOLN
INTRAMUSCULAR | Status: DC | PRN
  Administered 2012-05-18: 30 mL

## 2012-05-18 MED ORDER — SODIUM CHLORIDE 0.9 % IV SOLN
INTRAVENOUS | Status: DC
  Administered 2012-05-18: 20 mL/h via INTRAVENOUS

## 2012-05-18 MED ORDER — THROMBIN 20000 UNITS EX SOLR
CUTANEOUS | Status: AC
  Filled 2012-05-18: qty 20000

## 2012-05-18 MED ORDER — 0.9 % SODIUM CHLORIDE (POUR BTL) OPTIME
TOPICAL | Status: DC | PRN
  Administered 2012-05-18: 1000 mL

## 2012-05-18 MED ORDER — OXYCODONE-ACETAMINOPHEN 5-325 MG PO TABS: 1.0000 | ORAL_TABLET | Freq: Four times a day (QID) | ORAL | Status: DC | PRN

## 2012-05-18 MED ORDER — BUPIVACAINE HCL (PF) 0.5 % IJ SOLN
INTRAMUSCULAR | Status: AC
  Filled 2012-05-18: qty 30

## 2012-05-18 MED ORDER — FENTANYL CITRATE 0.05 MG/ML IJ SOLN
INTRAMUSCULAR | Status: DC | PRN
  Administered 2012-05-18: 50 ug via INTRAVENOUS

## 2012-05-18 MED ORDER — PROPOFOL 10 MG/ML IV EMUL
INTRAVENOUS | Status: DC | PRN
  Administered 2012-05-18: 50 ug/kg/min via INTRAVENOUS

## 2012-05-18 MED ORDER — MUPIROCIN 2 % EX OINT
TOPICAL_OINTMENT | CUTANEOUS | Status: AC
  Administered 2012-05-18: 1 via NASAL
  Filled 2012-05-18: qty 22

## 2012-05-18 MED ORDER — BUPIVACAINE HCL (PF) 0.5 % IJ SOLN
INTRAMUSCULAR | Status: DC | PRN
  Administered 2012-05-18: 30 mL

## 2012-05-18 SURGICAL SUPPLY — 38 items
CANISTER SUCTION 2500CC (MISCELLANEOUS) ×2 IMPLANT
CLIP TI MEDIUM 6 (CLIP) ×2 IMPLANT
CLIP TI WIDE RED SMALL 6 (CLIP) ×2 IMPLANT
CLOTH BEACON ORANGE TIMEOUT ST (SAFETY) ×2 IMPLANT
COVER PROBE W GEL 5X96 (DRAPES) ×2 IMPLANT
COVER SURGICAL LIGHT HANDLE (MISCELLANEOUS) ×4 IMPLANT
DECANTER SPIKE VIAL GLASS SM (MISCELLANEOUS) ×2 IMPLANT
DERMABOND ADVANCED (GAUZE/BANDAGES/DRESSINGS) ×1
DERMABOND ADVANCED .7 DNX12 (GAUZE/BANDAGES/DRESSINGS) ×1 IMPLANT
DRAIN PENROSE 1/2X12 LTX STRL (WOUND CARE) IMPLANT
ELECT REM PT RETURN 9FT ADLT (ELECTROSURGICAL) ×2
ELECTRODE REM PT RTRN 9FT ADLT (ELECTROSURGICAL) ×1 IMPLANT
GLOVE BIO SURGEON STRL SZ7 (GLOVE) ×2 IMPLANT
GLOVE BIO SURGEON STRL SZ7.5 (GLOVE) ×2 IMPLANT
GLOVE BIOGEL PI IND STRL 6.5 (GLOVE) ×2 IMPLANT
GLOVE BIOGEL PI IND STRL 7.5 (GLOVE) ×2 IMPLANT
GLOVE BIOGEL PI INDICATOR 6.5 (GLOVE) ×2
GLOVE BIOGEL PI INDICATOR 7.5 (GLOVE) ×2
GLOVE SS BIOGEL STRL SZ 7 (GLOVE) ×1 IMPLANT
GLOVE SUPERSENSE BIOGEL SZ 7 (GLOVE) ×1
GOWN PREVENTION PLUS XLARGE (GOWN DISPOSABLE) ×2 IMPLANT
GOWN STRL NON-REIN LRG LVL3 (GOWN DISPOSABLE) ×4 IMPLANT
KIT BASIN OR (CUSTOM PROCEDURE TRAY) ×2 IMPLANT
KIT ROOM TURNOVER OR (KITS) ×2 IMPLANT
NS IRRIG 1000ML POUR BTL (IV SOLUTION) ×2 IMPLANT
PACK CV ACCESS (CUSTOM PROCEDURE TRAY) ×2 IMPLANT
PAD ARMBOARD 7.5X6 YLW CONV (MISCELLANEOUS) ×4 IMPLANT
SPONGE SURGIFOAM ABS GEL 100 (HEMOSTASIS) IMPLANT
SUT MNCRL AB 4-0 PS2 18 (SUTURE) ×2 IMPLANT
SUT PROLENE 6 0 BV (SUTURE) ×2 IMPLANT
SUT PROLENE 7 0 BV 1 (SUTURE) ×2 IMPLANT
SUT SILK 2 0 SH (SUTURE) IMPLANT
SUT VIC AB 3-0 SH 27 (SUTURE) ×1
SUT VIC AB 3-0 SH 27X BRD (SUTURE) ×1 IMPLANT
TOWEL OR 17X24 6PK STRL BLUE (TOWEL DISPOSABLE) ×2 IMPLANT
TOWEL OR 17X26 10 PK STRL BLUE (TOWEL DISPOSABLE) ×2 IMPLANT
UNDERPAD 30X30 INCONTINENT (UNDERPADS AND DIAPERS) ×2 IMPLANT
WATER STERILE IRR 1000ML POUR (IV SOLUTION) ×2 IMPLANT

## 2012-05-18 NOTE — Anesthesia Procedure Notes (Addendum)
Procedure Name: MAC Date/Time: 05/18/2012 9:58 AM Performed by: De Nurse Pre-anesthesia Checklist: Patient identified, Timeout performed, Emergency Drugs available, Suction available and Patient being monitored Patient Re-evaluated:Patient Re-evaluated prior to inductionOxygen Delivery Method: Simple face mask Intubation Type: IV induction

## 2012-05-18 NOTE — Op Note (Signed)
OPERATIVE NOTE   PROCEDURE: 1. left first stage basilic vein transposition (brachiobasilic arteriovenous fistula) placement  PRE-OPERATIVE DIAGNOSIS: end stage renal disease .  POST-OPERATIVE DIAGNOSIS: same as above   SURGEON: Leonides Sake, MD  ASSISTANT(S): Lianne Cure, [facility]   ANESTHESIA: local and IV sedation  ESTIMATED BLOOD LOSS: 30 cc  FINDING(S): 1. Weakly palpable thrill in basilic vein 2. Dopplerable ulnar > radial signal  SPECIMEN(S):  none  INDICATIONS:   [name] [name] is a 71 y.o. female who presents with end stage renal disease.  She previously required right basilic vein transposition ligation due to steal.  After pre-operative studies failed to reveal significant atherosclerotic disease in the left arm's arterial system, we decided to proceed with a basilic vein transposition in this arm.  The patient is scheduled for left first stage basilic vein transposition.  The patient is aware the risks include but are not limited to: bleeding, infection, steal syndrome, nerve damage, ischemic monomelic neuropathy, failure to mature, and need for additional procedures.  The patient is aware of the risks of the procedure and elects to proceed forward.  DESCRIPTION: After full informed written consent was obtained from the patient, the patient was brought back to the operating room and placed supine upon the operating table.  Prior to induction, the patient received IV antibiotics.   After obtaining adequate anesthesia, the patient was then prepped and draped in the standard fashion for a left arm access procedure.  I turned my attention first to identifying the patient's basilic vein and brachial artery.  Using SonoSite guidance, the location of these vessels were marked out on the skin.   At this point, I injected local anesthetic to obtain a field block of the antecubitum.  In total, I injected about 10 mL of a 1:1 mixture of 0.5% Marcaine without epinephrine and 1% lidocaine with  epinephrine.  I made a transverse incision at the level of the antecubitum and dissected through the subcutaneous tissue and fascia to gain exposure of the brachial artery.  This was noted to be 2.5 mm in diameter externally.  On Sonosite, I did not identify a high bifurcation on this side.  This was dissected out proximally and distally and controlled with vessel loops .  I then dissected out a cubital vein draining into the basilic vein.  This was noted to be 3-4 mm in diameter externally.  The distal segment of the vein was ligated with a  2-0 silk, and the vein was transected.  The proximal segment was iinterrogated with serial dilators.  The vein accepted up to a 4 mm dilator without any difficulty.  I then instilled the heparinized saline into the vein and clamped it.  At this point, I reset my exposure of the brachial artery and placed the artery under tension proximally and distally.  I made an arteriotomy with a #11 blade, and then I extended the arteriotomy with a Potts scissor.  Note, I kept the arteriotomy small, 3-3.5 mm.  I injected heparinized saline proximal and distal to this arteriotomy.  The vein was then sewn to the artery in an end-to-side configuration with a running stitch of 7-0 Prolene.  Prior to completing this anastomosis, I allowed the vein and artery to backbleed.  There was no evidence of clot from any vessels.  I completed the anastomosis in the usual fashion and then released all vessel loops and clamps.  There was a weakly palpable  thrill in the venous outflow, and there was weakly  dopplerable pulse with a stronger ulnar signal.  Note, both signals augmented with venous outflow compression.  At this point, I irrigated out the surgical wound.  There was no further active bleeding.  The subcutaneous tissue was reapproximated with a running stitch of 3-0 Vicryl.  The skin was then reapproximated with a running subcuticular stitch of 4-0 Vicryl.  The skin was then cleaned, dried, and  reinforced with Dermabond.  The patient tolerated this procedure well.   COMPLICATIONS: none  CONDITION: stable  Leonides Sake, MD [facility] [place] Office: [phone] Pager: [phone]  05/18/2012, 11:22 AM

## 2012-05-18 NOTE — Anesthesia Postprocedure Evaluation (Signed)
Anesthesia Post Note  Patient: [name] [name]  Procedure(s) Performed: Procedure(s) (LRB): BASCILIC VEIN TRANSPOSITION (Left)  Anesthesia type: MAC  Patient location: PACU  Post pain: Pain level controlled  Post assessment: Patient's Cardiovascular Status Stable  Last Vitals:  Filed Vitals:   05/18/12 1145  BP: 101/47  Pulse: 78  Temp:   Resp: 18    Post vital signs: Reviewed and stable  Level of consciousness: sedated  Complications: No apparent anesthesia complications

## 2012-05-18 NOTE — Preoperative (Signed)
Beta Blockers   Reason not to administer Beta Blockers:Not Applicable, last dose 05/18/12 at 0600

## 2012-05-18 NOTE — H&P (Addendum)
[facility] [place]  Brief History and Physical  History of Present Illness  [name] [name] is a 71 y.o. female who presents with chief complaint: end stage renal disease.  The patient presents today for L 1st stage BVT vs AVG placement.    Past Medical History  Diagnosis Date  . Diabetes mellitus   . Hypercholesterolemia   . Hypothyroidism   . Colitis   . Chronic kidney disease   . Coronary artery disease   . Hypertension   . GERD (gastroesophageal reflux disease)   . Myocardial infarction   . Peripheral vascular disease   . COPD (chronic obstructive pulmonary disease)   . Pneumonia   . Angina   . Shortness of breath   . H/O Clostridium difficile infection   . Hx of pulmonary embolus     Past Surgical History  Procedure Date  . Tubal ligation   . Amputaton of right fifth toe   . Tonsillectomy   . Insertion of dialysis catheter   . Av fistula placement [id number]    right RUE AVF    History   Social History  . Marital Status: Widowed    Spouse Name: N/A    Number of Children: N/A  . Years of Education: N/A   Occupational History  . Not on file.   Social History Main Topics  . Smoking status: Former Smoker -- 1.0 packs/day for 40 years    Types: Cigarettes    Quit date: 06/11/2011  . Smokeless tobacco: Never Used  . Alcohol Use: No  . Drug Use: No  . Sexually Active: Not Currently    Birth Control/ Protection: Post-menopausal   Other Topics Concern  . Not on file   Social History Narrative  . No narrative on file    Family History  Problem Relation Age of Onset  . Diabetes type II    . Hypertension    . Diabetes Mother   . Kidney disease Mother   . Diabetes Father   . Anesthesia problems Neg Hx     No current facility-administered medications on file prior to encounter.   Current Outpatient Prescriptions on File Prior to Encounter  Medication Sig Dispense Refill  . amLODipine (NORVASC) 10 MG tablet Take 10 mg by mouth  daily.       Marland Kitchen aspirin 81 MG chewable tablet Chew 81 mg by mouth daily.      . calcium acetate (PHOSLO) 667 MG capsule Take 667 mg by mouth 2 (two) times daily.       . insulin glargine (LANTUS) 100 UNIT/ML injection Inject 18 Units into the skin at bedtime.      . isosorbide mononitrate (IMDUR) 120 MG 24 hr tablet Take 120 mg by mouth daily.       Marland Kitchen levothyroxine (SYNTHROID, LEVOTHROID) 25 MCG tablet Take 25 mcg by mouth daily.       . metoprolol tartrate (LOPRESSOR) 25 MG tablet Take 25 mg by mouth 2 (two) times daily.      . nitroGLYCERIN (NITROSTAT) 0.4 MG SL tablet Place 0.4 mg under the tongue every 5 (five) minutes as needed. For chest pain.      Marland Kitchen oxyCODONE-acetaminophen (PERCOCET) 5-325 MG per tablet Take 1 tablet by mouth every 6 (six) hours as needed. For pain.      . pantoprazole (PROTONIX) 40 MG tablet Take 40 mg by mouth daily.       . Probiotic Product (PROBIOTIC PO) Take 1 capsule by  mouth daily.      . simvastatin (ZOCOR) 10 MG tablet Take 10 mg by mouth daily at 6 PM.       . warfarin (COUMADIN) 2 MG tablet Take 2 mg by mouth daily.       . darbepoetin (ARANESP) 60 MCG/0.3ML SOLN Inject 60 mcg into the vein every Friday with hemodialysis.      Marland Kitchen sodium chloride 0.9 % SOLN 100 mL with ferric gluconate 12.5 MG/ML SOLN 125 mg Inject 62.5 mg into the vein every Wednesday with hemodialysis.      Marland Kitchen DISCONTD: enoxaparin (LOVENOX) 60 MG/0.6ML SOLN Inject 0.55 mLs (55 mg total) into the skin daily.        Allergies  Allergen Reactions  . Procaine Hcl Nausea And Vomiting and Other (See Comments)    Unknown    Review of Systems: As listed above, otherwise negative.  Physical Examination  Filed Vitals:   05/18/12 0741 05/18/12 0809  BP: 115/66   Pulse: 82   Temp: 97.4 F (36.3 C)   TempSrc: Oral   Resp: 18   Height:  5\' 5"  (1.651 m)  Weight:  136 lb (61.689 kg)  SpO2: 94%     General: A&O x 3, WDWN  Pulmonary: Sym exp, good air movt, CTAB, no rales, rhonchi, &  wheezing  Cardiac: RRR, Nl S1, S2, no Murmurs, rubs or gallops  Gastrointestinal: soft, NTND, -G/R, - HSM, - masses, - CVAT B  Musculoskeletal: M/S 5/5 throughout , Extremities without ischemic changes , incision in R arm well healed  Laboratory See iStat  Medical Decision Making  [name] [name] is a 71 y.o. female who presents with: end stage renal disease requiring permanent access placement.   The patient is scheduled for: L 1st BVT vs AVG placement  Risk, benefits, and alternatives to access surgery were discussed.  The patient is aware the risks include but are not limited to: bleeding, infection, steal syndrome, nerve damage, ischemic monomelic neuropathy, failure to mature, and need for additional procedures.  The patient is aware of the risks and agrees to proceed.  Leonides Sake, MD [facility] [place] Office: [phone] Pager: [phone]  05/18/2012, 7:33 AM

## 2012-05-18 NOTE — Telephone Encounter (Addendum)
Message copied by Rosalyn Charters on Tue May 18, 2012  4:40 PM ------      Message from: Melene Plan      Created: Tue May 18, 2012  2:19 PM                   ----- Message -----         From: Fransisco Hertz, MD         Sent: 05/18/2012  11:27 AM           To: Reuel Derby, Melene Plan, RN            LOUVINIA CUMBO      [id number]      07-Jul-1941            PROCEDURE:      left first stage basilic vein transposition (brachiobasilic arteriovenous fistula) placement            Asst: Lianne Cure, [facility]             Follow-up: 4 wk           notified patient's son of fu appt on 06-11-12 at 3:15 p.m.

## 2012-05-18 NOTE — Anesthesia Preprocedure Evaluation (Signed)
Anesthesia Evaluation  Patient identified by MRN, date of birth, ID band Patient awake    Reviewed: Allergy & Precautions, H&P , NPO status , Patient's Chart, lab work & pertinent test results, reviewed documented beta blocker date and time   History of Anesthesia Complications Negative for: history of anesthetic complications  Airway Mallampati: I TM Distance: >3 FB Neck ROM: Full    Dental  (+) Upper Dentures, Lower Dentures and Dental Advisory Given   Pulmonary shortness of breath and with exertion, COPD COPD inhaler,    Pulmonary exam normal       Cardiovascular hypertension, Pt. on home beta blockers + angina with exertion + CAD, + Past MI and +CHF Rhythm:Regular Rate:Normal     Neuro/Psych negative neurological ROS     GI/Hepatic Neg liver ROS, GERD-  Medicated and Controlled,  Endo/Other  Insulin DependentHypothyroidism   Renal/GU CRFRenal disease     Musculoskeletal   Abdominal   Peds  Hematology   Anesthesia Other Findings   Reproductive/Obstetrics                           Anesthesia Physical Anesthesia Plan  ASA: III  Anesthesia Plan: MAC   Post-op Pain Management:    Induction: Intravenous  Airway Management Planned: Simple Face Mask  Additional Equipment:   Intra-op Plan:   Post-operative Plan:   Informed Consent: I have reviewed the patients History and Physical, chart, labs and discussed the procedure including the risks, benefits and alternatives for the proposed anesthesia with the patient or authorized representative who has indicated his/her understanding and acceptance.   Dental advisory given  Plan Discussed with: Anesthesiologist, CRNA and Surgeon  Anesthesia Plan Comments:         Anesthesia Quick Evaluation

## 2012-05-18 NOTE — Transfer of Care (Signed)
Immediate Anesthesia Transfer of Care Note  Patient: [name] [name]  Procedure(s) Performed: Procedure(s) (LRB): BASCILIC VEIN TRANSPOSITION (Left)  Patient Location: PACU  Anesthesia Type: MAC  Level of Consciousness: awake, alert  and oriented  Airway & Oxygen Therapy: Patient Spontanous Breathing and Patient connected to nasal cannula oxygen  Post-op Assessment: Report given to PACU RN  Post vital signs: Reviewed and stable  Complications: No apparent anesthesia complications

## 2012-05-19 LAB — GLUCOSE, CAPILLARY: Glucose-Capillary: 279 mg/dL — ABNORMAL HIGH (ref 70–99)

## 2012-06-10 ENCOUNTER — Encounter: Payer: Self-pay | Admitting: Vascular Surgery

## 2012-06-11 ENCOUNTER — Encounter: Payer: Self-pay | Admitting: Vascular Surgery

## 2012-06-11 ENCOUNTER — Ambulatory Visit (INDEPENDENT_AMBULATORY_CARE_PROVIDER_SITE_OTHER): Payer: Medicare Other | Admitting: Vascular Surgery

## 2012-06-11 VITALS — BP 177/151 | HR 104 | Temp 99.0°F | Ht 65.0 in | Wt 135.0 lb

## 2012-06-11 DIAGNOSIS — N186 End stage renal disease: Secondary | ICD-10-CM

## 2012-06-11 NOTE — Progress Notes (Signed)
[facility] [place]  Postoperative Access Visit  History of Present Illness  [name] [name] is a 71 y.o. year old female who presents for postoperative follow-up for: L 1st BVT (Date: 05/18/12).  The patient's wounds are healed.  The patient notes no steal symptoms.  The patient is able to complete their activities of daily living.  The patient's current symptoms are: none.  Physical Examination  Filed Vitals:   06/11/12 1506  BP: 177/151  Pulse: 104  Temp: 99 F (37.2 C)   LUE: Incision is healed, skin feels cool compared to R, hand grip is 5/5, sensation in digits is intact, palpable thrill, bruit can be auscultated , Basilic vein is 5.5 - 6.0 mm on Sonosite  Medical Decision Making  [name] [name] is a 71 y.o. year old female who presents s/p L 1st BVT.  Will obtained L arm access duplex in 1 month.  I suspect the L BVT will be ready for transposition in one month  Thank you for allowing [place] to participate in this patient's care.  Leonides Sake, MD [facility] [place] Office: [phone] Pager: [phone]

## 2012-07-09 ENCOUNTER — Other Ambulatory Visit: Payer: Self-pay | Admitting: *Deleted

## 2012-07-09 DIAGNOSIS — Z4931 Encounter for adequacy testing for hemodialysis: Secondary | ICD-10-CM

## 2012-07-09 DIAGNOSIS — N186 End stage renal disease: Secondary | ICD-10-CM

## 2012-07-09 IMAGING — CR DG CHEST 2V
2 series · 2 of 2 positions shown · non-contrast
Comparison: Chest x-ray of 02/23/2012

CLINICAL DATA: For insertion of an AV fistula

CHEST - 2 VIEW

[view not recorded (1 of 2)]
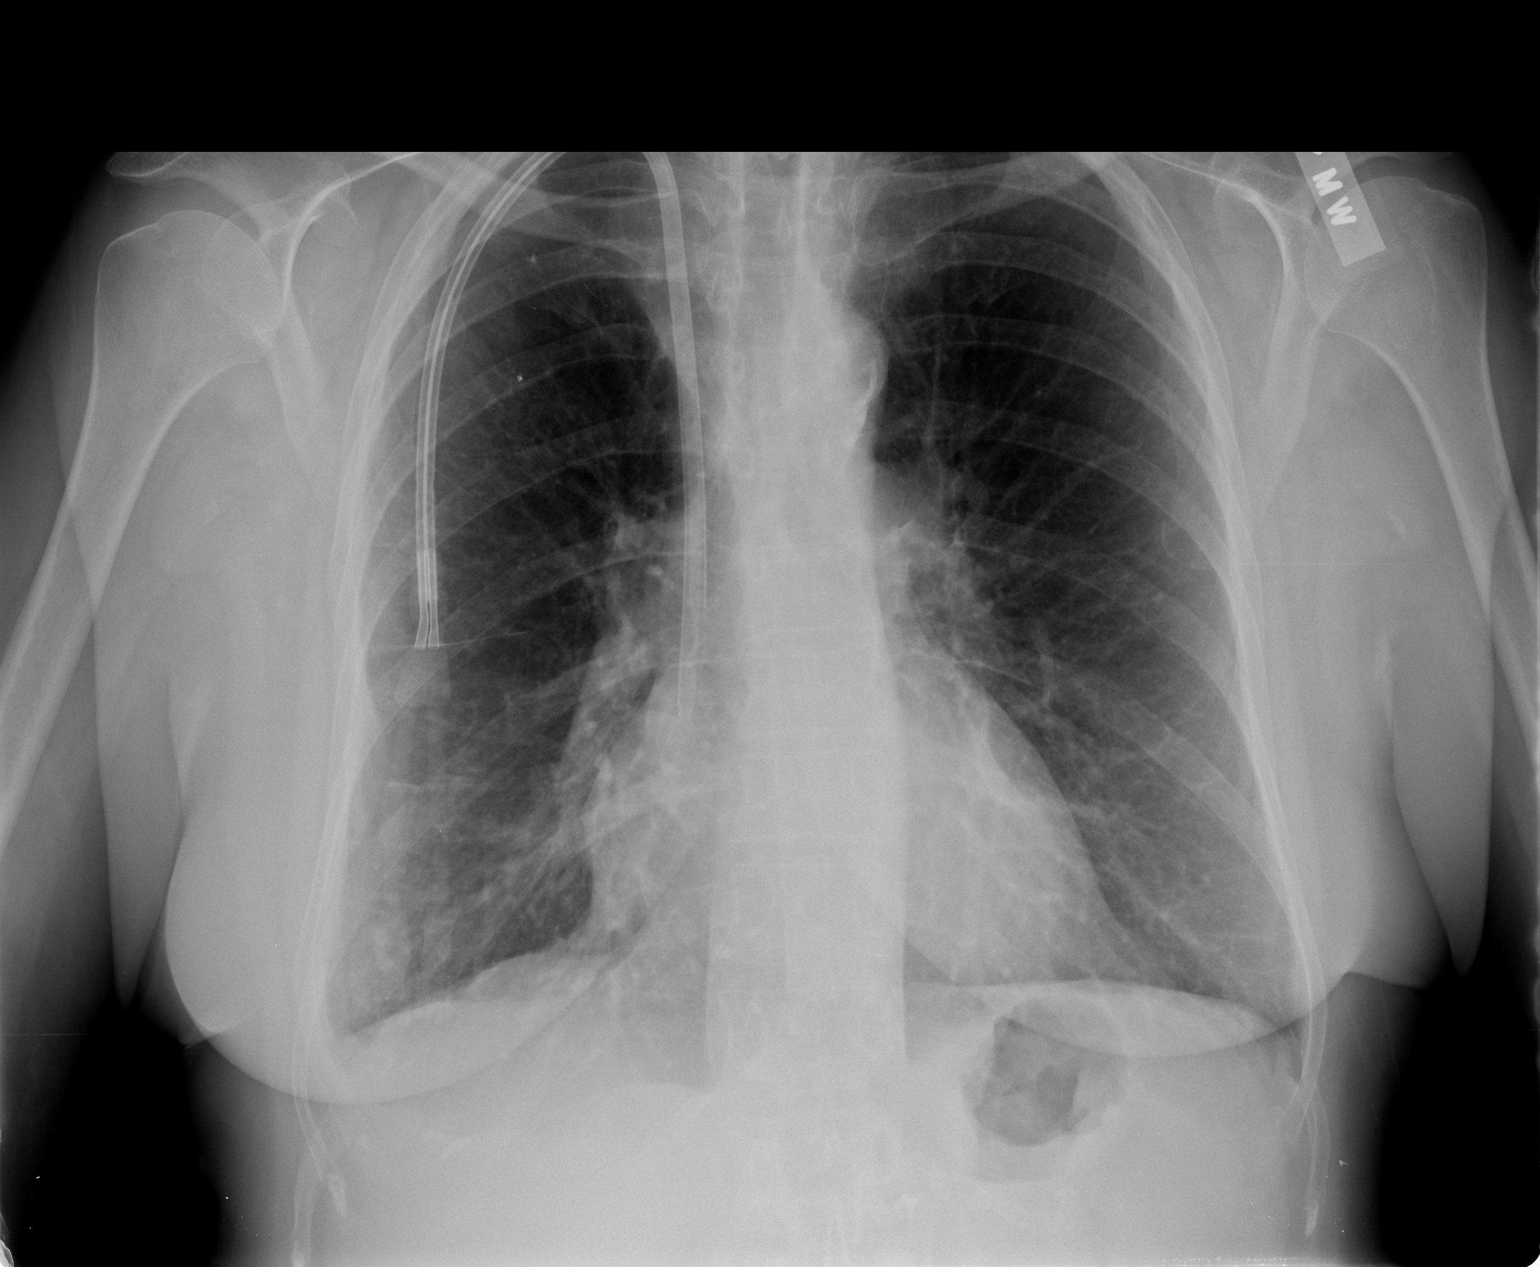

[view not recorded (2 of 2)]
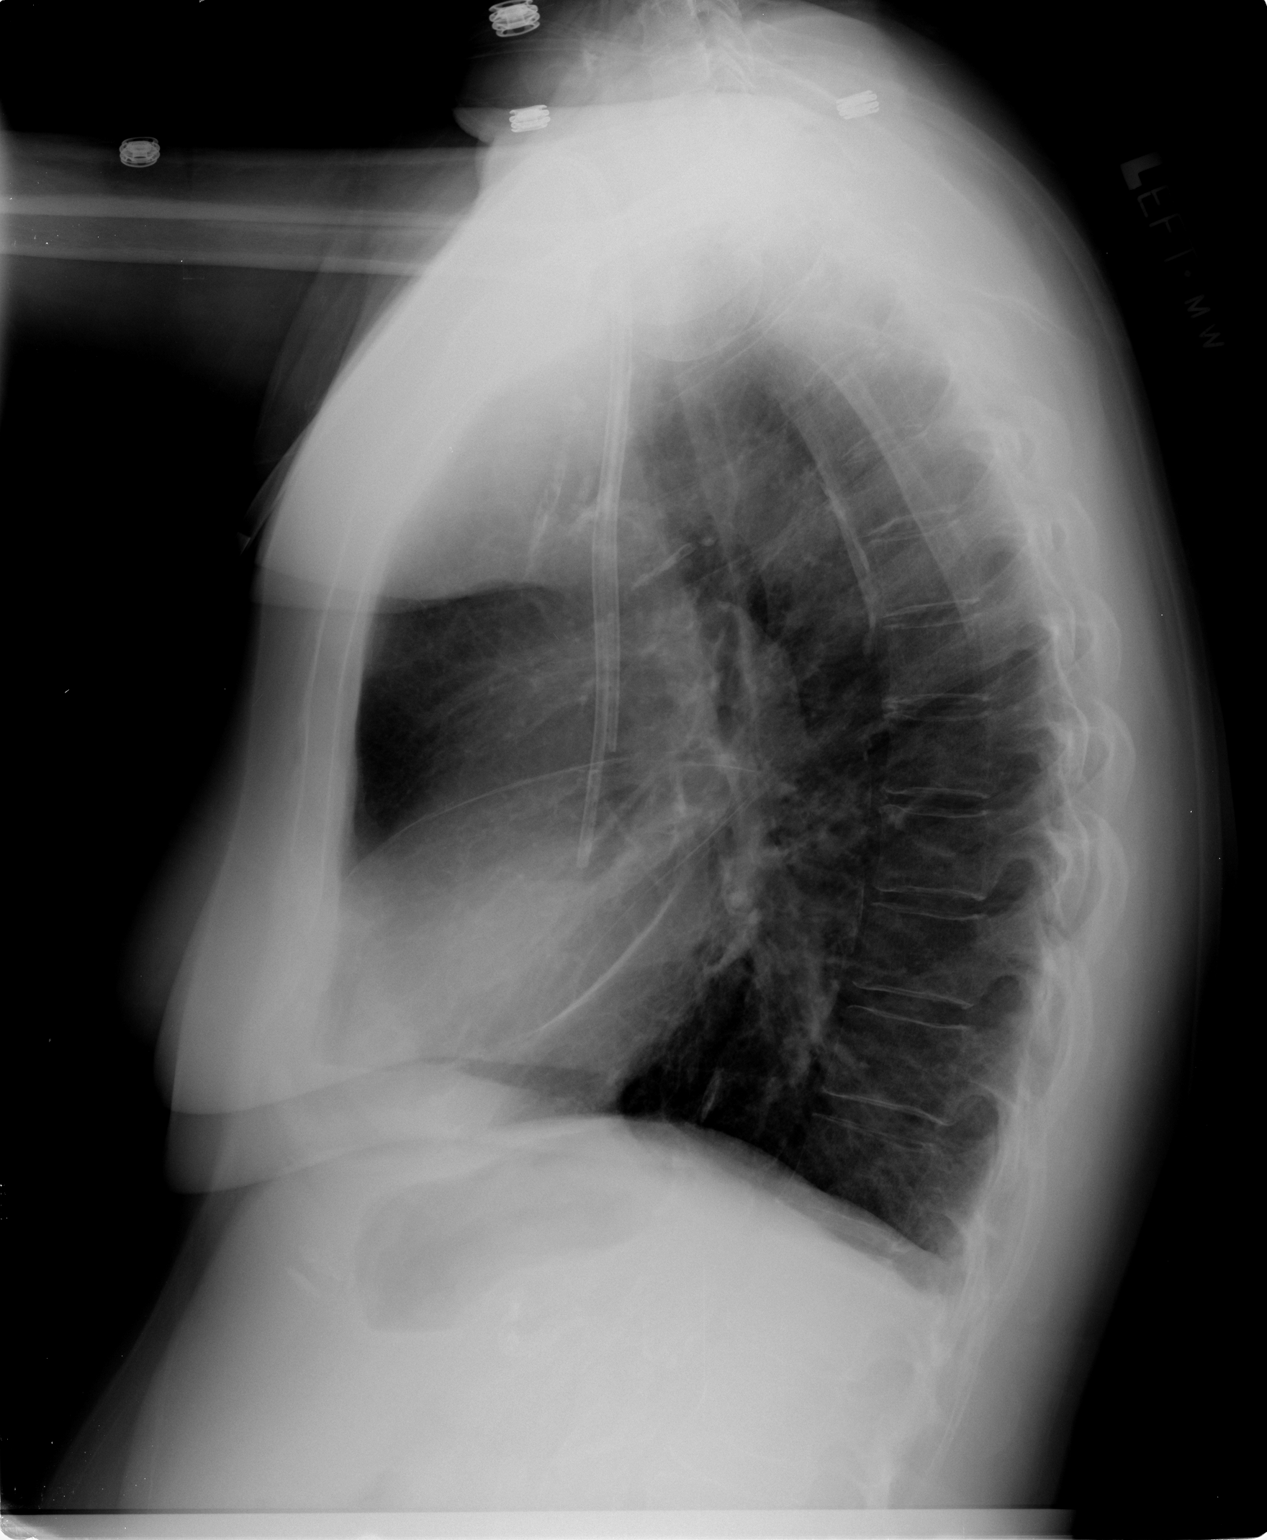

[2 of 2 positions shown; findings below may reference images not displayed]

FINDINGS: Right central venous line remains with tips in the lower
SVC just above the expected right atrial junction.  The lungs are
clear.  Mediastinal contours are stable.  The heart is within
normal limits in size.  No bony abnormality is seen.
IMPRESSION: No active lung disease.  No change in position of central venous
dual lumen catheter.

## 2012-07-15 ENCOUNTER — Encounter: Payer: Self-pay | Admitting: Vascular Surgery

## 2012-07-16 ENCOUNTER — Encounter: Payer: Self-pay | Admitting: *Deleted

## 2012-07-16 ENCOUNTER — Other Ambulatory Visit: Payer: Self-pay | Admitting: *Deleted

## 2012-07-16 ENCOUNTER — Encounter (HOSPITAL_COMMUNITY): Payer: Self-pay | Admitting: Pharmacy Technician

## 2012-07-16 ENCOUNTER — Ambulatory Visit (INDEPENDENT_AMBULATORY_CARE_PROVIDER_SITE_OTHER): Payer: Medicare Other | Admitting: Vascular Surgery

## 2012-07-16 ENCOUNTER — Encounter: Payer: Self-pay | Admitting: Vascular Surgery

## 2012-07-16 VITALS — BP 171/80 | HR 103 | Resp 20 | Ht 65.0 in | Wt 133.4 lb

## 2012-07-16 DIAGNOSIS — N186 End stage renal disease: Secondary | ICD-10-CM

## 2012-07-16 DIAGNOSIS — Z4931 Encounter for adequacy testing for hemodialysis: Secondary | ICD-10-CM

## 2012-07-16 NOTE — Progress Notes (Signed)
LUE AVF duplex performed @ [facility] 07/16/2012

## 2012-07-16 NOTE — Progress Notes (Signed)
[facility] [place]  Postoperative Access Visit  History of Present Illness  [name] [name] is a 71 y.o. year old female who presents for postoperative follow-up for: L 1st BVT (Date: 05/18/12).  The patient's wounds are healed.  The patient notes no steal symptoms.  The patient is able to complete their activities of daily living.  The patient's current symptoms are: none.  Pt returns for L arm access duplex to determine maturation.  Physical Examination  Filed Vitals:   07/16/12 1444  BP: 171/80  Pulse: 103  Resp: 20   LUE: Incision is healed, skin feels warm, hand grip is 5/5, sensation in digits is intact, palpable thrill, bruit can be auscultated   L arm access duplex (Date: 07/16/12)  Diameter 4.4 mm - 8.6 mm  Possible distal vein stenosis  Medical Decision Making  [name] [name] is a 71 y.o. year old female who presents s/p L 1st BVT.  Patient is scheduled for her L 2nd stage BVT on 24 SEP 13.  Thank you for allowing [place] to participate in this patient's care.  Leonides Sake, MD [facility] [place] Office: [phone] Pager: [phone]

## 2012-07-19 NOTE — Pre-Procedure Instructions (Signed)
22 [name] [name]  07/19/2012   Your procedure is scheduled on:  Tuesday July 27, 2012 at 1000 AM  Report to [facility] at 0800 AM.  Call this number if you have problems the morning of surgery: [phone]   Remember:   Do not eat food or drink:After Midnight.Monday  .    Take these medicines the morning of surgery with A SIP OF WATER: Amlodipine, Clonidine, Isosorbide Mononitrate, Synthroid, Metoprolol,  Oxycodone-Acetaminophen if needed, and Protonix. Stop Coumadin as instructed by MD.   Do not wear jewelry, make-up or nail polish.  Do not wear lotions, powders, or perfumes. You may wear deodorant.  Do not shave 48 hours prior to surgery. Men may shave face and neck.  Do not bring valuables to the hospital.  Contacts, dentures or bridgework may not be worn into surgery.  Leave suitcase in the car. After surgery it may be brought to your room.  For patients admitted to the hospital, checkout time is 11:00 AM the day of discharge.   Patients discharged the day of surgery will not be allowed to drive home.    Special Instructions: CHG Shower Use Special Wash: 1/2 bottle night before surgery and 1/2 bottle morning of surgery.   Please read over the following fact sheets that you were given: Pain Booklet, Coughing and Deep Breathing, MRSA Information and Surgical Site Infection Prevention

## 2012-07-20 ENCOUNTER — Encounter (HOSPITAL_COMMUNITY)
Admission: RE | Admit: 2012-07-20 | Discharge: 2012-07-20 | Disposition: A | Discharge status: Home / Self Care | Payer: Medicare Other | Source: Ambulatory Visit | Attending: Vascular Surgery | Admitting: Vascular Surgery

## 2012-07-20 ENCOUNTER — Encounter (HOSPITAL_COMMUNITY): Payer: Self-pay

## 2012-07-20 LAB — SURGICAL PCR SCREEN: MRSA, PCR: NEGATIVE

## 2012-07-26 MED ORDER — DEXTROSE 5 % IV SOLN
1.5000 g | INTRAVENOUS | Status: AC
  Administered 2012-07-27: 1.5 g via INTRAVENOUS
  Filled 2012-07-26: qty 1.5

## 2012-07-26 MED ORDER — SODIUM CHLORIDE 0.9 % IV SOLN
INTRAVENOUS | Status: DC
  Administered 2012-07-27: 12:00:00 via INTRAVENOUS

## 2012-07-27 ENCOUNTER — Encounter (HOSPITAL_COMMUNITY): Payer: Self-pay | Admitting: Anesthesiology

## 2012-07-27 ENCOUNTER — Encounter (HOSPITAL_COMMUNITY): Payer: Self-pay | Admitting: *Deleted

## 2012-07-27 ENCOUNTER — Encounter (HOSPITAL_COMMUNITY)
Admission: RE | Disposition: A | Discharge status: Home / Self Care | Payer: Self-pay | Source: Ambulatory Visit | Attending: Vascular Surgery

## 2012-07-27 ENCOUNTER — Ambulatory Visit (HOSPITAL_COMMUNITY): Payer: Medicare Other | Admitting: Anesthesiology

## 2012-07-27 ENCOUNTER — Ambulatory Visit (HOSPITAL_COMMUNITY)
Admission: RE | Admit: 2012-07-27 | Discharge: 2012-07-27 | Disposition: A | Discharge status: Home / Self Care | Payer: Medicare Other | Source: Ambulatory Visit | Attending: Vascular Surgery | Admitting: Vascular Surgery

## 2012-07-27 DIAGNOSIS — N186 End stage renal disease: Secondary | ICD-10-CM

## 2012-07-27 DIAGNOSIS — E119 Type 2 diabetes mellitus without complications: Secondary | ICD-10-CM | POA: Insufficient documentation

## 2012-07-27 DIAGNOSIS — J449 Chronic obstructive pulmonary disease, unspecified: Secondary | ICD-10-CM | POA: Insufficient documentation

## 2012-07-27 DIAGNOSIS — J4489 Other specified chronic obstructive pulmonary disease: Secondary | ICD-10-CM | POA: Insufficient documentation

## 2012-07-27 DIAGNOSIS — I120 Hypertensive chronic kidney disease with stage 5 chronic kidney disease or end stage renal disease: Secondary | ICD-10-CM | POA: Insufficient documentation

## 2012-07-27 HISTORY — PX: BASCILIC VEIN TRANSPOSITION: SHX5742

## 2012-07-27 SURGERY — TRANSPOSITION, VEIN, BASILIC
Anesthesia: Monitor Anesthesia Care | Site: Arm Upper | Laterality: Left | Wound class: Clean

## 2012-07-27 MED ORDER — 0.9 % SODIUM CHLORIDE (POUR BTL) OPTIME
TOPICAL | Status: DC | PRN
  Administered 2012-07-27: 1000 mL

## 2012-07-27 MED ORDER — THROMBIN 20000 UNITS EX SOLR
CUTANEOUS | Status: DC | PRN
  Administered 2012-07-27: 14:00:00 via TOPICAL

## 2012-07-27 MED ORDER — PROPOFOL 10 MG/ML IV BOLUS
INTRAVENOUS | Status: DC | PRN
  Administered 2012-07-27: 160 mg via INTRAVENOUS

## 2012-07-27 MED ORDER — DEXTROSE 5 % IV SOLN
INTRAVENOUS | Status: DC | PRN
  Administered 2012-07-27: 12:00:00 via INTRAVENOUS

## 2012-07-27 MED ORDER — HEPARIN SODIUM (PORCINE) 1000 UNIT/ML IJ SOLN
INTRAMUSCULAR | Status: DC | PRN
  Administered 2012-07-27: 3000 [IU] via INTRAVENOUS

## 2012-07-27 MED ORDER — OXYCODONE HCL 5 MG PO TABS: 5.0000 mg | ORAL_TABLET | ORAL | Status: DC | PRN

## 2012-07-27 MED ORDER — SODIUM CHLORIDE 0.9 % IV SOLN
INTRAVENOUS | Status: DC | PRN
  Administered 2012-07-27: 12:00:00 via INTRAVENOUS

## 2012-07-27 MED ORDER — MIDAZOLAM HCL 5 MG/5ML IJ SOLN
INTRAMUSCULAR | Status: DC | PRN
  Administered 2012-07-27: 1 mg via INTRAVENOUS

## 2012-07-27 MED ORDER — FENTANYL CITRATE 0.05 MG/ML IJ SOLN
INTRAMUSCULAR | Status: DC | PRN
  Administered 2012-07-27 (×2): 50 ug via INTRAVENOUS

## 2012-07-27 MED ORDER — SODIUM CHLORIDE 0.9 % IR SOLN
Status: DC | PRN
  Administered 2012-07-27: 13:00:00

## 2012-07-27 MED ORDER — PROTAMINE SULFATE 10 MG/ML IV SOLN
INTRAVENOUS | Status: DC | PRN
  Administered 2012-07-27: 15 mg via INTRAVENOUS

## 2012-07-27 MED ORDER — THROMBIN 20000 UNITS EX SOLR
CUTANEOUS | Status: AC
  Filled 2012-07-27: qty 40000

## 2012-07-27 MED ORDER — EPHEDRINE SULFATE 50 MG/ML IJ SOLN
INTRAMUSCULAR | Status: DC | PRN
  Administered 2012-07-27 (×2): 5 mg via INTRAVENOUS

## 2012-07-27 MED ORDER — HYDROMORPHONE HCL PF 1 MG/ML IJ SOLN
0.2500 mg | INTRAMUSCULAR | Status: DC | PRN
  Administered 2012-07-27: 0.5 mg via INTRAVENOUS

## 2012-07-27 MED ORDER — HYDROMORPHONE HCL PF 1 MG/ML IJ SOLN
INTRAMUSCULAR | Status: AC
  Filled 2012-07-27: qty 1

## 2012-07-27 MED ORDER — ONDANSETRON HCL 4 MG/2ML IJ SOLN
INTRAMUSCULAR | Status: DC | PRN
  Administered 2012-07-27: 4 mg via INTRAVENOUS

## 2012-07-27 SURGICAL SUPPLY — 42 items
CANISTER SUCTION 2500CC (MISCELLANEOUS) ×2 IMPLANT
CATH EMB 4FR 40CM (CATHETERS) ×2 IMPLANT
CLIP TI MEDIUM 24 (CLIP) ×2 IMPLANT
CLIP TI WIDE RED SMALL 24 (CLIP) ×2 IMPLANT
CLOTH BEACON ORANGE TIMEOUT ST (SAFETY) ×2 IMPLANT
COVER PROBE W GEL 5X96 (DRAPES) IMPLANT
COVER SURGICAL LIGHT HANDLE (MISCELLANEOUS) ×2 IMPLANT
DECANTER SPIKE VIAL GLASS SM (MISCELLANEOUS) IMPLANT
DERMABOND ADVANCED (GAUZE/BANDAGES/DRESSINGS) ×1
DERMABOND ADVANCED .7 DNX12 (GAUZE/BANDAGES/DRESSINGS) ×1 IMPLANT
DRAIN PENROSE 1/2X12 LTX STRL (WOUND CARE) IMPLANT
ELECT REM PT RETURN 9FT ADLT (ELECTROSURGICAL) ×2
ELECTRODE REM PT RTRN 9FT ADLT (ELECTROSURGICAL) ×1 IMPLANT
GLOVE BIO SURGEON STRL SZ 6.5 (GLOVE) ×4 IMPLANT
GLOVE BIO SURGEON STRL SZ7 (GLOVE) ×2 IMPLANT
GLOVE BIOGEL PI IND STRL 6.5 (GLOVE) ×3 IMPLANT
GLOVE BIOGEL PI IND STRL 7.0 (GLOVE) ×1 IMPLANT
GLOVE BIOGEL PI IND STRL 7.5 (GLOVE) ×2 IMPLANT
GLOVE BIOGEL PI INDICATOR 6.5 (GLOVE) ×3
GLOVE BIOGEL PI INDICATOR 7.0 (GLOVE) ×1
GLOVE BIOGEL PI INDICATOR 7.5 (GLOVE) ×2
GLOVE ECLIPSE 6.5 STRL STRAW (GLOVE) ×2 IMPLANT
GLOVE SS BIOGEL STRL SZ 7 (GLOVE) ×1 IMPLANT
GLOVE SUPERSENSE BIOGEL SZ 7 (GLOVE) ×1
GOWN STRL NON-REIN LRG LVL3 (GOWN DISPOSABLE) ×6 IMPLANT
GOWN STRL REIN 2XL LVL4 (GOWN DISPOSABLE) ×2 IMPLANT
KIT BASIN OR (CUSTOM PROCEDURE TRAY) ×2 IMPLANT
KIT ROOM TURNOVER OR (KITS) ×2 IMPLANT
NS IRRIG 1000ML POUR BTL (IV SOLUTION) ×2 IMPLANT
PACK CV ACCESS (CUSTOM PROCEDURE TRAY) ×2 IMPLANT
PAD ARMBOARD 7.5X6 YLW CONV (MISCELLANEOUS) ×4 IMPLANT
SPONGE SURGIFOAM ABS GEL 100 (HEMOSTASIS) ×2 IMPLANT
SUT MNCRL AB 4-0 PS2 18 (SUTURE) ×4 IMPLANT
SUT PROLENE 6 0 BV (SUTURE) ×2 IMPLANT
SUT PROLENE 7 0 BV 1 (SUTURE) ×2 IMPLANT
SUT SILK 2 0 SH (SUTURE) ×2 IMPLANT
SUT VIC AB 3-0 SH 27 (SUTURE) ×4
SUT VIC AB 3-0 SH 27X BRD (SUTURE) ×4 IMPLANT
TOWEL OR 17X24 6PK STRL BLUE (TOWEL DISPOSABLE) ×2 IMPLANT
TOWEL OR 17X26 10 PK STRL BLUE (TOWEL DISPOSABLE) ×2 IMPLANT
UNDERPAD 30X30 INCONTINENT (UNDERPADS AND DIAPERS) ×2 IMPLANT
WATER STERILE IRR 1000ML POUR (IV SOLUTION) ×2 IMPLANT

## 2012-07-27 NOTE — Op Note (Addendum)
OPERATIVE NOTE   PROCEDURE: left second stage basilic vein transposition (brachiobasilic arteriovenous fistula) placement with revision of anastomosis  PRE-OPERATIVE DIAGNOSIS: end stage renal disease, successful first stage basilic vein transposition   POST-OPERATIVE DIAGNOSIS: same as above   SURGEON: Leonides Sake, MD  ASSISTANT(S): Della Goo, [facility]   ANESTHESIA: general  ESTIMATED BLOOD LOSS: 50 cc  FINDING(S): 1. Weak thrill in basilic vein transposition prior to revision 2. Stenotic distal basilic vein (~3 mm without strong thrill), otherwise rest of basilic vein 7-8 mm in diameter 3. Palpable thrill and dopplerable radial signal at end of case  SPECIMEN(S):  none  INDICATIONS:   [name] [name] is a 71 y.o. female who presents with end stage renal disease and s/p successful left first stage basilic vein transposition.  The patient is scheduled for left second stage basilic vein transposition.  The patient is aware the risks include but are not limited to: bleeding, infection, steal syndrome, nerve damage, ischemic monomelic neuropathy, failure to mature, and need for additional procedures.  The patient is aware of the risks of the procedure and elects to proceed forward.  DESCRIPTION: After full informed written consent was obtained from the patient, the patient was brought back to the operating room and placed supine upon the operating table.  Prior to induction, the patient received IV antibiotics.   After obtaining adequate anesthesia, the patient was then prepped and draped in the standard fashion for a left arm access procedure.  I turned my attention first to identifying the patient's brachiobasilic arteriovenous fistula.  Using SonoSite guidance, the location of this fistula was marked out on the skin.   I made an longitudinal incision over the fistula from its arterial anastomosis up to its axillary extent.  I carefully dissected the fistula away from its adjacent nerves.   Eventually the entirety of this fistula was mobilized.  The distal 2-3 cm of this basilic vein was stenotic and there was a weak thrill this fistula.  Based on the prior access duplex, this distal segment had a stenotic valve present, so I felt revision of the anastomosis was going to be necessary.  I dissected out the brachial artery proximally.  I gave the patient 3000 units of Heparin intravenously and then waited 3 minutes.  The fistula was marked anteriorly to help with orientation.   I clamped the fistula directly adjacent to the anastomosis and transected the fistula.  I tied off the distal anastomosis with 2 2-0 silk ties, leaving a portion of the fistula as a vein patch.  The distal fistula had a retained valve, so I cut off this segment also.  I tunneled in a curvilinear fashion on top of the bicep.  The basilic vein was tied to the inner cannula and passed through the [facility] metal tunneler, taking care to retain orientation.  I sharply released the tie to the basilic vein and removed the tunneler with inner cannula.  I placed the brachial artery under tension proximally and distally with vessel loops.  I made an arteriotomy with a 11-blade and extended it with a Pott's scissor.  The basilic vein was spatulated to the dimensions of this arteriotomy.  I sewed the vein to the artery in an end to side configuration with a running stitch of 7-0 Prolene.  Prior to completing this anastomosis, I backbled both ends of the artery and the basilic vein.  I released the vessel loops and clamp and immediately a strong thrill was present in the tunneled basilic  vein transposition.  Distally, I could feel a weakly palpable radial pulse, which was verified with a dopplerable signal.  The deep subcutaneous tissue was inspected for bleeding.  Bleeding was controlled with electrocautery and placement of large pieces of thrombin and gelfoam.  I washed out the surgical site after waiting a few minutes, and there was no further  bleeding.  The deep subcutaneous tissue was reapproximated with mattress sutures of 3-0 Vicryl to eliminate some of the dead space.  The superficial subcutaneous tissue was then reapproximated along the incision line with a running stitch of 3-0 Vicryl.  The skin was then reapproximated with a running subcuticular of 4-0 Monocryl.  The skin was then cleaned, dried, and reinforced with Dermabond.  The patient tolerated this procedure well.   COMPLICATIONS: none  CONDITION: stable  Leonides Sake, MD [facility] [place] Office: [phone] Pager: [phone]  07/27/2012, 2:12 PM

## 2012-07-27 NOTE — Progress Notes (Signed)
Patient and educated on importance of calling MD if she develops numbness, tingling, severe pain in left arm, redness or fever. Patient and family done teach back with this.

## 2012-07-27 NOTE — Progress Notes (Signed)
Patient has thrill in left arm. Patient left hand is cooler than right with non- palpabale pulse per sending RN in PACU this is been patient normal assessment and Dr. Imogene Burn and PA is aware of same.

## 2012-07-27 NOTE — Preoperative (Signed)
Beta Blockers   Reason not to administer Beta Blockers:Lopressor 7 a.m. 

## 2012-07-27 NOTE — Anesthesia Procedure Notes (Signed)
Procedure Name: LMA Insertion Date/Time: 07/27/2012 12:06 PM Performed by: Darcey Nora B Pre-anesthesia Checklist: Patient identified, Emergency Drugs available, Suction available and Patient being monitored Patient Re-evaluated:Patient Re-evaluated prior to inductionOxygen Delivery Method: Circle system utilized Preoxygenation: Pre-oxygenation with 100% oxygen Intubation Type: IV induction Ventilation: Mask ventilation without difficulty LMA: LMA with gastric port inserted LMA Size: 4.0 Placement Confirmation: positive ETCO2 and breath sounds checked- equal and bilateral Tube secured with: Tape (across flange to cheeks) Dental Injury: Teeth and Oropharynx as per pre-operative assessment

## 2012-07-27 NOTE — H&P (Signed)
[facility] [place]  Brief History and Physical  History of Present Illness  [name] [name] is a 71 y.o. female who presents with chief complaint: end stage renal disease .  The patient presents today for Left 2nd stage basilic vein transposition.    Past Medical History  Diagnosis Date  . Diabetes mellitus   . Hypercholesterolemia   . Hypothyroidism   . Colitis   . Coronary artery disease   . Hypertension   . GERD (gastroesophageal reflux disease)   . Myocardial infarction   . Peripheral vascular disease   . Pneumonia   . Angina   . H/O Clostridium difficile infection   . Hx of pulmonary embolus   . COPD (chronic obstructive pulmonary disease)   . Shortness of breath     uses O2 as needed  . Chronic kidney disease     M-W-F   [place]    Past Surgical History  Procedure Date  . Tubal ligation   . Amputaton of right fifth toe   . Tonsillectomy   . Insertion of dialysis catheter   . Av fistula placement [id number]    right RUE AVF  . Bvt May 18, 2012    Left arm    History   Social History  . Marital Status: Widowed    Spouse Name: N/A    Number of Children: N/A  . Years of Education: N/A   Occupational History  . Not on file.   Social History Main Topics  . Smoking status: Former Smoker -- 1.0 packs/day for 40 years    Types: Cigarettes    Quit date: 06/11/2011  . Smokeless tobacco: Never Used  . Alcohol Use: No  . Drug Use: No  . Sexually Active: Not Currently    Birth Control/ Protection: Post-menopausal   Other Topics Concern  . Not on file   Social History Narrative  . No narrative on file    Family History  Problem Relation Age of Onset  . Diabetes type II    . Hypertension    . Diabetes Mother   . Kidney disease Mother   . Diabetes Father   . Anesthesia problems Neg Hx     No current facility-administered medications on file prior to encounter.   Current Outpatient Prescriptions on File Prior to Encounter    Medication Sig Dispense Refill  . amLODipine (NORVASC) 10 MG tablet Take 10 mg by mouth daily.       Marland Kitchen aspirin 81 MG chewable tablet Chew 81 mg by mouth daily.      . calcium acetate (PHOSLO) 667 MG capsule Take 667 mg by mouth 2 (two) times daily.       . cloNIDine (CATAPRES) 0.2 MG tablet Take 0.2 mg by mouth 2 (two) times daily.       Marland Kitchen doxycycline (VIBRA-TABS) 100 MG tablet Take 100 mg by mouth 2 (two) times daily.      . insulin glargine (LANTUS) 100 UNIT/ML injection Inject 18 Units into the skin at bedtime.      . isosorbide mononitrate (IMDUR) 120 MG 24 hr tablet Take 120 mg by mouth daily.       Marland Kitchen levothyroxine (SYNTHROID, LEVOTHROID) 25 MCG tablet Take 25 mcg by mouth daily.       . metoprolol tartrate (LOPRESSOR) 25 MG tablet Take 25 mg by mouth 2 (two) times daily.      . nitroGLYCERIN (NITROSTAT) 0.4 MG SL tablet Place 0.4 mg under the  tongue every 5 (five) minutes as needed. For chest pain.      . simvastatin (ZOCOR) 10 MG tablet Take 10 mg by mouth daily at 6 PM.       . sodium chloride 0.9 % SOLN 100 mL with ferric gluconate 12.5 MG/ML SOLN 125 mg Inject 62.5 mg into the vein every Wednesday with hemodialysis.      Marland Kitchen warfarin (COUMADIN) 2 MG tablet Take 2 mg by mouth daily.       . darbepoetin (ARANESP) 60 MCG/0.3ML SOLN Inject 60 mcg into the vein every Friday with hemodialysis.      Marland Kitchen pantoprazole (PROTONIX) 40 MG tablet Take 40 mg by mouth daily.       . Probiotic Product (PROBIOTIC PO) Take 1 capsule by mouth daily.      Marland Kitchen DISCONTD: enoxaparin (LOVENOX) 60 MG/0.6ML SOLN Inject 0.55 mLs (55 mg total) into the skin daily.        Allergies  Allergen Reactions  . Procaine Hcl Nausea And Vomiting and Other (See Comments)    "Pass out"    Review of Systems: As listed above, otherwise negative.  Physical Examination  Filed Vitals:   07/27/12 1024  BP: 121/66  Pulse: 68  Temp: 97.5 F (36.4 C)  TempSrc: Oral  Resp: 18  SpO2: 95%    General: A&O x 3,  WDWN  Pulmonary: Sym exp, good air movt, CTAB, no rales, rhonchi, & wheezing  Cardiac: RRR, Nl S1, S2, no Murmurs, rubs or gallops  Gastrointestinal: soft, NTND, -G/R, - HSM, - masses, - CVAT B  Musculoskeletal: M/S 5/5 throughout , Extremities without ischemic changes , palpable thrill and bruit in left arm  Laboratory See iStat  Medical Decision Making  [name] [name] is a 71 y.o. female who presents with: end stage renal disease.   The patient is scheduled for: Left second stage basilic vein transposition   Risk, benefits, and alternatives to access surgery were discussed.  The patient is aware the risks include but are not limited to: bleeding, infection, steal syndrome, nerve damage, ischemic monomelic neuropathy, failure to mature, and need for additional procedures.  The patient is aware of the risks and agrees to proceed.  Leonides Sake, MD [facility] [place] Office: [phone] Pager: [phone]  07/27/2012, 11:03 AM

## 2012-07-27 NOTE — Anesthesia Preprocedure Evaluation (Addendum)
Anesthesia Evaluation  Patient identified by MRN, date of birth, ID band Patient awake    Reviewed: Allergy & Precautions, H&P , NPO status , Patient's Chart, lab work & pertinent test results, reviewed documented beta blocker date and time   Airway Mallampati: II TM Distance: >3 FB Neck ROM: Full    Dental  (+) Edentulous Upper, Edentulous Lower and Dental Advisory Given   Pulmonary shortness of breath and with exertion, COPD oxygen dependent,  breath sounds clear to auscultation        Cardiovascular hypertension, Pt. on home beta blockers + angina + CAD, + Past MI and +CHF Rhythm:Regular Rate:Normal     Neuro/Psych    GI/Hepatic GERD-  Controlled and Medicated,  Endo/Other  diabetes, Type 2, Insulin DependentHypothyroidism Diagnosed with DM at age 71  Renal/GU ESRF and DialysisRenal diseaseMon-Wed-Fri [place]     Musculoskeletal   Abdominal   Peds  Hematology   Anesthesia Other Findings   Reproductive/Obstetrics                        Anesthesia Physical Anesthesia Plan  ASA: IV  Anesthesia Plan: MAC   Post-op Pain Management:    Induction: Intravenous  Airway Management Planned: LMA  Additional Equipment:   Intra-op Plan:   Post-operative Plan: Extubation in OR  Informed Consent: I have reviewed the patients History and Physical, chart, labs and discussed the procedure including the risks, benefits and alternatives for the proposed anesthesia with the patient or authorized representative who has indicated his/her understanding and acceptance.     Plan Discussed with: CRNA, Anesthesiologist and Surgeon  Anesthesia Plan Comments:         Anesthesia Quick Evaluation

## 2012-07-27 NOTE — Anesthesia Postprocedure Evaluation (Signed)
  Anesthesia Post-op Note  Patient: [name] [name]  Procedure(s) Performed: Procedure(s) (LRB) with comments: BASCILIC VEIN TRANSPOSITION (Left) - Second Stage  Patient Location: PACU  Anesthesia Type: General  Level of Consciousness: awake  Airway and Oxygen Therapy: Patient Spontanous Breathing  Post-op Pain: mild  Post-op Assessment: Post-op Vital signs reviewed  Post-op Vital Signs: Reviewed  Complications: No apparent anesthesia complications

## 2012-07-27 NOTE — Transfer of Care (Signed)
Immediate Anesthesia Transfer of Care Note  Patient: [name] [name]  Procedure(s) Performed: Procedure(s) (LRB) with comments: BASCILIC VEIN TRANSPOSITION (Left) - Second Stage  Patient Location: PACU  Anesthesia Type: General  Level of Consciousness: awake, alert  and patient cooperative  Airway & Oxygen Therapy: Patient Spontanous Breathing and Patient connected to nasal cannula oxygen  Post-op Assessment: Report given to PACU RN, Post -op Vital signs reviewed and stable and Patient moving all extremities  Post vital signs: Reviewed and stable  Complications: No apparent anesthesia complications

## 2012-07-28 ENCOUNTER — Telehealth: Payer: Self-pay | Admitting: Vascular Surgery

## 2012-07-28 LAB — POCT I-STAT, CHEM 8
BUN: 42 mg/dL — ABNORMAL HIGH (ref 6–23)
Creatinine, Ser: 3.1 mg/dL — ABNORMAL HIGH (ref 0.50–1.10)
Glucose, Bld: 299 mg/dL — ABNORMAL HIGH (ref 70–99)
Hemoglobin: 14.6 g/dL (ref 12.0–15.0)
Potassium: 4.7 mEq/L (ref 3.5–5.1)
Sodium: 137 mEq/L (ref 135–145)

## 2012-07-28 NOTE — Telephone Encounter (Signed)
Message copied by Margaretmary Eddy on Wed Jul 28, 2012  9:38 AM ------      Message from: Melene Plan      Created: Tue Jul 27, 2012  2:49 PM                   ----- Message -----         From: Fransisco Hertz, MD         Sent: 07/27/2012   2:26 PM           To: Reuel Derby, Melene Plan, RN            [name] [name]      [id number]      10/27/1941                        PROCEDURE:      left second stage basilic vein transposition (brachiobasilic arteriovenous fistula) placement with revision of anastomosis            Asst: Della Goo, [facility]            Follow-up: 4 wk

## 2012-08-02 ENCOUNTER — Other Ambulatory Visit: Payer: Self-pay | Admitting: *Deleted

## 2012-08-02 ENCOUNTER — Other Ambulatory Visit: Payer: Self-pay

## 2012-08-02 DIAGNOSIS — N186 End stage renal disease: Secondary | ICD-10-CM

## 2012-08-02 DIAGNOSIS — T82598A Other mechanical complication of other cardiac and vascular devices and implants, initial encounter: Secondary | ICD-10-CM

## 2012-08-02 DIAGNOSIS — Z4931 Encounter for adequacy testing for hemodialysis: Secondary | ICD-10-CM

## 2012-08-04 ENCOUNTER — Encounter: Payer: Self-pay | Admitting: Vascular Surgery

## 2012-08-05 ENCOUNTER — Ambulatory Visit (INDEPENDENT_AMBULATORY_CARE_PROVIDER_SITE_OTHER): Payer: Medicare Other | Admitting: Vascular Surgery

## 2012-08-05 ENCOUNTER — Ambulatory Visit (INDEPENDENT_AMBULATORY_CARE_PROVIDER_SITE_OTHER): Payer: Medicare Other | Admitting: *Deleted

## 2012-08-05 ENCOUNTER — Encounter: Payer: Self-pay | Admitting: Vascular Surgery

## 2012-08-05 VITALS — BP 152/65 | HR 77 | Resp 16 | Ht 65.0 in | Wt 139.4 lb

## 2012-08-05 DIAGNOSIS — T82898A Other specified complication of vascular prosthetic devices, implants and grafts, initial encounter: Secondary | ICD-10-CM

## 2012-08-05 DIAGNOSIS — R200 Anesthesia of skin: Secondary | ICD-10-CM | POA: Insufficient documentation

## 2012-08-05 DIAGNOSIS — R202 Paresthesia of skin: Secondary | ICD-10-CM

## 2012-08-05 DIAGNOSIS — R209 Unspecified disturbances of skin sensation: Secondary | ICD-10-CM

## 2012-08-05 DIAGNOSIS — T82598A Other mechanical complication of other cardiac and vascular devices and implants, initial encounter: Secondary | ICD-10-CM

## 2012-08-05 NOTE — Progress Notes (Signed)
Patient is a 71 year old female who is status post first and second stage basilic vein transposition by Dr. Imogene Burn. Her last procedure was in September 24. She currently complains of numbness tingling and coolness in her left hand when on dialysis. She does not really have any numbness and tingling in her hand when she is not on dialysis. She denies any nonhealing wounds on her digits. She does not have weakness in the hand. She states the numbness and tingling was not present until after her second stage procedure.  Physical exam:  Filed Vitals:   08/05/12 1059  BP: 152/65  Pulse: 77  Resp: 16  Height: 5\' 5"  (1.651 m)  Weight: 139 lb 6.4 oz (63.231 kg)  SpO2: 65%   absent left radial pulse, 2+ right radial pulse  Right-sided dialysis catheter no obvious evidence of infection  Data: The patient had a fistula duplex today which showed the diameter of the fistula his 627.3 mm. 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 I discussed with the patient that if her symptoms are currently tolerable I would advise observation. If she begins to develop continuous numbness and tingling that is more bothersome to her or motor deficits or nonhealing ulcers we would have to consider ligating the fistula. She has followup scheduled with Dr. Imogene Burn for October 24 to review her symptoms.  Plan: See above  Fabienne Bruns, MD [facility] [place] Office: [phone] Pager: [phone]

## 2012-08-13 ENCOUNTER — Emergency Department (HOSPITAL_COMMUNITY): Payer: Medicare Other

## 2012-08-13 ENCOUNTER — Inpatient Hospital Stay (HOSPITAL_COMMUNITY)
Admission: EM | Admit: 2012-08-13 | Discharge: 2012-08-23 | DRG: 264 | Disposition: A | Discharge status: Home / Self Care | Payer: Medicare Other | Attending: Internal Medicine | Admitting: Internal Medicine

## 2012-08-13 ENCOUNTER — Encounter (HOSPITAL_COMMUNITY): Payer: Self-pay

## 2012-08-13 DIAGNOSIS — Z7982 Long term (current) use of aspirin: Secondary | ICD-10-CM

## 2012-08-13 DIAGNOSIS — E871 Hypo-osmolality and hyponatremia: Secondary | ICD-10-CM | POA: Diagnosis not present

## 2012-08-13 DIAGNOSIS — D649 Anemia, unspecified: Secondary | ICD-10-CM

## 2012-08-13 DIAGNOSIS — I739 Peripheral vascular disease, unspecified: Secondary | ICD-10-CM | POA: Diagnosis present

## 2012-08-13 DIAGNOSIS — D638 Anemia in other chronic diseases classified elsewhere: Secondary | ICD-10-CM | POA: Diagnosis present

## 2012-08-13 DIAGNOSIS — G458 Other transient cerebral ischemic attacks and related syndromes: Secondary | ICD-10-CM | POA: Diagnosis present

## 2012-08-13 DIAGNOSIS — S98139A Complete traumatic amputation of one unspecified lesser toe, initial encounter: Secondary | ICD-10-CM

## 2012-08-13 DIAGNOSIS — I509 Heart failure, unspecified: Secondary | ICD-10-CM | POA: Diagnosis present

## 2012-08-13 DIAGNOSIS — Z23 Encounter for immunization: Secondary | ICD-10-CM

## 2012-08-13 DIAGNOSIS — Z794 Long term (current) use of insulin: Secondary | ICD-10-CM

## 2012-08-13 DIAGNOSIS — T82898A Other specified complication of vascular prosthetic devices, implants and grafts, initial encounter: Secondary | ICD-10-CM

## 2012-08-13 DIAGNOSIS — E7800 Pure hypercholesterolemia, unspecified: Secondary | ICD-10-CM | POA: Diagnosis present

## 2012-08-13 DIAGNOSIS — E039 Hypothyroidism, unspecified: Secondary | ICD-10-CM | POA: Diagnosis present

## 2012-08-13 DIAGNOSIS — Y841 Kidney dialysis as the cause of abnormal reaction of the patient, or of later complication, without mention of misadventure at the time of the procedure: Secondary | ICD-10-CM | POA: Diagnosis present

## 2012-08-13 DIAGNOSIS — J189 Pneumonia, unspecified organism: Secondary | ICD-10-CM

## 2012-08-13 DIAGNOSIS — J4489 Other specified chronic obstructive pulmonary disease: Secondary | ICD-10-CM | POA: Diagnosis present

## 2012-08-13 DIAGNOSIS — I519 Heart disease, unspecified: Secondary | ICD-10-CM

## 2012-08-13 DIAGNOSIS — Z992 Dependence on renal dialysis: Secondary | ICD-10-CM

## 2012-08-13 DIAGNOSIS — T80218A Other infection due to central venous catheter, initial encounter: Principal | ICD-10-CM | POA: Diagnosis present

## 2012-08-13 DIAGNOSIS — Z7901 Long term (current) use of anticoagulants: Secondary | ICD-10-CM

## 2012-08-13 DIAGNOSIS — E875 Hyperkalemia: Secondary | ICD-10-CM

## 2012-08-13 DIAGNOSIS — Z86711 Personal history of pulmonary embolism: Secondary | ICD-10-CM

## 2012-08-13 DIAGNOSIS — Z9981 Dependence on supplemental oxygen: Secondary | ICD-10-CM

## 2012-08-13 DIAGNOSIS — R200 Anesthesia of skin: Secondary | ICD-10-CM

## 2012-08-13 DIAGNOSIS — I2510 Atherosclerotic heart disease of native coronary artery without angina pectoris: Secondary | ICD-10-CM | POA: Diagnosis present

## 2012-08-13 DIAGNOSIS — E1129 Type 2 diabetes mellitus with other diabetic kidney complication: Secondary | ICD-10-CM

## 2012-08-13 DIAGNOSIS — Z79899 Other long term (current) drug therapy: Secondary | ICD-10-CM

## 2012-08-13 DIAGNOSIS — I10 Essential (primary) hypertension: Secondary | ICD-10-CM

## 2012-08-13 DIAGNOSIS — J449 Chronic obstructive pulmonary disease, unspecified: Secondary | ICD-10-CM | POA: Diagnosis present

## 2012-08-13 DIAGNOSIS — N390 Urinary tract infection, site not specified: Secondary | ICD-10-CM

## 2012-08-13 DIAGNOSIS — I252 Old myocardial infarction: Secondary | ICD-10-CM

## 2012-08-13 DIAGNOSIS — N2581 Secondary hyperparathyroidism of renal origin: Secondary | ICD-10-CM | POA: Diagnosis present

## 2012-08-13 DIAGNOSIS — B957 Other staphylococcus as the cause of diseases classified elsewhere: Secondary | ICD-10-CM | POA: Diagnosis present

## 2012-08-13 DIAGNOSIS — Z87891 Personal history of nicotine dependence: Secondary | ICD-10-CM

## 2012-08-13 DIAGNOSIS — K219 Gastro-esophageal reflux disease without esophagitis: Secondary | ICD-10-CM | POA: Diagnosis present

## 2012-08-13 DIAGNOSIS — R748 Abnormal levels of other serum enzymes: Secondary | ICD-10-CM

## 2012-08-13 DIAGNOSIS — R7881 Bacteremia: Secondary | ICD-10-CM

## 2012-08-13 DIAGNOSIS — K5289 Other specified noninfective gastroenteritis and colitis: Secondary | ICD-10-CM | POA: Diagnosis present

## 2012-08-13 DIAGNOSIS — E119 Type 2 diabetes mellitus without complications: Secondary | ICD-10-CM | POA: Diagnosis present

## 2012-08-13 DIAGNOSIS — B351 Tinea unguium: Secondary | ICD-10-CM

## 2012-08-13 DIAGNOSIS — I120 Hypertensive chronic kidney disease with stage 5 chronic kidney disease or end stage renal disease: Secondary | ICD-10-CM | POA: Diagnosis present

## 2012-08-13 DIAGNOSIS — N186 End stage renal disease: Secondary | ICD-10-CM | POA: Diagnosis present

## 2012-08-13 DIAGNOSIS — J9610 Chronic respiratory failure, unspecified whether with hypoxia or hypercapnia: Secondary | ICD-10-CM | POA: Diagnosis present

## 2012-08-13 DIAGNOSIS — K529 Noninfective gastroenteritis and colitis, unspecified: Secondary | ICD-10-CM

## 2012-08-13 DIAGNOSIS — Z86718 Personal history of other venous thrombosis and embolism: Secondary | ICD-10-CM

## 2012-08-13 DIAGNOSIS — J9600 Acute respiratory failure, unspecified whether with hypoxia or hypercapnia: Secondary | ICD-10-CM

## 2012-08-13 LAB — PROTIME-INR: Prothrombin Time: 24.9 seconds — ABNORMAL HIGH (ref 11.6–15.2)

## 2012-08-13 LAB — CBC WITH DIFFERENTIAL/PLATELET
Basophils Relative: 0 % (ref 0–1)
Eosinophils Absolute: 0 10*3/uL (ref 0.0–0.7)
Eosinophils Relative: 0 % (ref 0–5)
Hemoglobin: 11.4 g/dL — ABNORMAL LOW (ref 12.0–15.0)
Lymphs Abs: 0.9 10*3/uL (ref 0.7–4.0)
MCH: 28.8 pg (ref 26.0–34.0)
MCHC: 33.3 g/dL (ref 30.0–36.0)
MCV: 86.4 fL (ref 78.0–100.0)
Monocytes Absolute: 0.4 10*3/uL (ref 0.1–1.0)
Monocytes Relative: 3 % (ref 3–12)
Neutrophils Relative %: 91 % — ABNORMAL HIGH (ref 43–77)
RBC: 3.96 MIL/uL (ref 3.87–5.11)

## 2012-08-13 LAB — COMPREHENSIVE METABOLIC PANEL
Albumin: 3.2 g/dL — ABNORMAL LOW (ref 3.5–5.2)
Alkaline Phosphatase: 103 U/L (ref 39–117)
BUN: 53 mg/dL — ABNORMAL HIGH (ref 6–23)
Calcium: 9.2 mg/dL (ref 8.4–10.5)
Creatinine, Ser: 4.51 mg/dL — ABNORMAL HIGH (ref 0.50–1.10)
GFR calc Af Amer: 11 mL/min — ABNORMAL LOW (ref >90)
Glucose, Bld: 200 mg/dL — ABNORMAL HIGH (ref 70–99)
Potassium: 3.8 mEq/L (ref 3.5–5.1)
Total Protein: 6.9 g/dL (ref 6.0–8.3)

## 2012-08-13 LAB — LACTIC ACID, PLASMA: Lactic Acid, Venous: 0.7 mmol/L (ref 0.5–2.2)

## 2012-08-13 MED ORDER — VANCOMYCIN HCL IN DEXTROSE 1-5 GM/200ML-% IV SOLN
1000.0000 mg | Freq: Once | INTRAVENOUS | Status: AC
  Administered 2012-08-13: 1000 mg via INTRAVENOUS

## 2012-08-13 MED ORDER — PANTOPRAZOLE SODIUM 40 MG PO TBEC
40.0000 mg | DELAYED_RELEASE_TABLET | Freq: Every day | ORAL | Status: DC
  Administered 2012-08-14 – 2012-08-23 (×10): 40 mg via ORAL
  Filled 2012-08-13 (×9): qty 1

## 2012-08-13 MED ORDER — CALCIUM ACETATE 667 MG PO CAPS
667.0000 mg | ORAL_CAPSULE | Freq: Two times a day (BID) | ORAL | Status: DC
  Administered 2012-08-15 – 2012-08-22 (×13): 667 mg via ORAL
  Filled 2012-08-13 (×21): qty 1

## 2012-08-13 MED ORDER — LEVOTHYROXINE SODIUM 25 MCG PO TABS
25.0000 ug | ORAL_TABLET | Freq: Every day | ORAL | Status: DC
  Administered 2012-08-14 – 2012-08-23 (×9): 25 ug via ORAL
  Filled 2012-08-13 (×11): qty 1

## 2012-08-13 MED ORDER — ACETAMINOPHEN 325 MG PO TABS
650.0000 mg | ORAL_TABLET | Freq: Once | ORAL | Status: AC
  Administered 2012-08-13: 650 mg via ORAL
  Filled 2012-08-13: qty 2

## 2012-08-13 MED ORDER — SIMVASTATIN 10 MG PO TABS
10.0000 mg | ORAL_TABLET | Freq: Every day | ORAL | Status: DC
  Administered 2012-08-14 – 2012-08-22 (×8): 10 mg via ORAL
  Filled 2012-08-13 (×10): qty 1

## 2012-08-13 MED ORDER — ISOSORBIDE MONONITRATE ER 60 MG PO TB24
120.0000 mg | ORAL_TABLET | Freq: Every day | ORAL | Status: DC
  Administered 2012-08-14 – 2012-08-23 (×10): 120 mg via ORAL
  Filled 2012-08-13 (×11): qty 2

## 2012-08-13 MED ORDER — INSULIN ASPART 100 UNIT/ML ~~LOC~~ SOLN
0.0000 [IU] | Freq: Three times a day (TID) | SUBCUTANEOUS | Status: DC
  Administered 2012-08-15 (×2): 3 [IU] via SUBCUTANEOUS
  Administered 2012-08-16: 2 [IU] via SUBCUTANEOUS
  Administered 2012-08-16 – 2012-08-17 (×2): 5 [IU] via SUBCUTANEOUS
  Administered 2012-08-17: 3 [IU] via SUBCUTANEOUS
  Administered 2012-08-17: 8 [IU] via SUBCUTANEOUS
  Administered 2012-08-18: 5 [IU] via SUBCUTANEOUS
  Administered 2012-08-18: 8 [IU] via SUBCUTANEOUS
  Administered 2012-08-19 – 2012-08-20 (×2): 3 [IU] via SUBCUTANEOUS
  Administered 2012-08-20: 11 [IU] via SUBCUTANEOUS
  Administered 2012-08-20: 13:00:00 via SUBCUTANEOUS
  Administered 2012-08-21 – 2012-08-22 (×2): 8 [IU] via SUBCUTANEOUS
  Administered 2012-08-22: 5 [IU] via SUBCUTANEOUS
  Administered 2012-08-22: 3 [IU] via SUBCUTANEOUS
  Administered 2012-08-22: 8 [IU] via SUBCUTANEOUS
  Administered 2012-08-23: 11 [IU] via SUBCUTANEOUS

## 2012-08-13 MED ORDER — METOPROLOL TARTRATE 25 MG PO TABS
25.0000 mg | ORAL_TABLET | Freq: Two times a day (BID) | ORAL | Status: DC
  Administered 2012-08-14 – 2012-08-23 (×17): 25 mg via ORAL
  Filled 2012-08-13 (×21): qty 1

## 2012-08-13 MED ORDER — HYDROCODONE-ACETAMINOPHEN 5-325 MG PO TABS
1.0000 | ORAL_TABLET | ORAL | Status: DC | PRN
  Administered 2012-08-14 – 2012-08-22 (×4): 2 via ORAL
  Filled 2012-08-13: qty 1
  Filled 2012-08-13 (×2): qty 2

## 2012-08-13 MED ORDER — WARFARIN SODIUM 2 MG PO TABS
2.0000 mg | ORAL_TABLET | Freq: Every day | ORAL | Status: DC
  Administered 2012-08-14 – 2012-08-15 (×3): 2 mg via ORAL
  Filled 2012-08-13 (×4): qty 1

## 2012-08-13 MED ORDER — ACETAMINOPHEN 650 MG RE SUPP: 650.0000 mg | Freq: Four times a day (QID) | RECTAL | Status: DC | PRN

## 2012-08-13 MED ORDER — INSULIN ASPART 100 UNIT/ML ~~LOC~~ SOLN
0.0000 [IU] | Freq: Every day | SUBCUTANEOUS | Status: DC
  Administered 2012-08-14: 3 [IU] via SUBCUTANEOUS
  Administered 2012-08-15: 2 [IU] via SUBCUTANEOUS
  Administered 2012-08-17: 3 [IU] via SUBCUTANEOUS
  Administered 2012-08-18: 2 [IU] via SUBCUTANEOUS
  Administered 2012-08-19: 3 [IU] via SUBCUTANEOUS
  Administered 2012-08-20 – 2012-08-22 (×3): 2 [IU] via SUBCUTANEOUS

## 2012-08-13 MED ORDER — PIPERACILLIN-TAZOBACTAM 3.375 G IVPB
3.3750 g | Freq: Once | INTRAVENOUS | Status: AC
Start: 2012-08-13 — End: 2012-08-14
  Administered 2012-08-14: 3.375 g via INTRAVENOUS
  Filled 2012-08-13: qty 50

## 2012-08-13 MED ORDER — NITROGLYCERIN 0.4 MG SL SUBL: 0.4000 mg | SUBLINGUAL_TABLET | SUBLINGUAL | Status: DC | PRN

## 2012-08-13 MED ORDER — ASPIRIN 81 MG PO CHEW
81.0000 mg | CHEWABLE_TABLET | Freq: Every day | ORAL | Status: DC
  Administered 2012-08-14 – 2012-08-23 (×10): 81 mg via ORAL
  Filled 2012-08-13 (×10): qty 1

## 2012-08-13 MED ORDER — CLONIDINE HCL 0.2 MG PO TABS
0.2000 mg | ORAL_TABLET | Freq: Two times a day (BID) | ORAL | Status: DC
  Administered 2012-08-14 – 2012-08-23 (×17): 0.2 mg via ORAL
  Filled 2012-08-13 (×21): qty 1

## 2012-08-13 MED ORDER — SODIUM CHLORIDE 0.9 % IV BOLUS (SEPSIS)
1000.0000 mL | Freq: Once | INTRAVENOUS | Status: AC
  Administered 2012-08-13: 1000 mL via INTRAVENOUS

## 2012-08-13 MED ORDER — AMLODIPINE BESYLATE 10 MG PO TABS
10.0000 mg | ORAL_TABLET | Freq: Every day | ORAL | Status: DC
  Administered 2012-08-15 – 2012-08-23 (×9): 10 mg via ORAL
  Filled 2012-08-13 (×11): qty 1

## 2012-08-13 MED ORDER — ACETAMINOPHEN 325 MG PO TABS
650.0000 mg | ORAL_TABLET | Freq: Four times a day (QID) | ORAL | Status: DC | PRN
  Administered 2012-08-14: 650 mg via ORAL
  Filled 2012-08-13: qty 2

## 2012-08-13 MED ORDER — INSULIN GLARGINE 100 UNIT/ML ~~LOC~~ SOLN
18.0000 [IU] | Freq: Every day | SUBCUTANEOUS | Status: DC
  Administered 2012-08-14: 18 [IU] via SUBCUTANEOUS

## 2012-08-13 MED ORDER — SODIUM CHLORIDE 0.9 % IJ SOLN
3.0000 mL | INTRAMUSCULAR | Status: DC | PRN
  Administered 2012-08-21 – 2012-08-22 (×2): 3 mL via INTRAVENOUS

## 2012-08-13 NOTE — ED Provider Notes (Signed)
History     CSN: [id number]  Arrival date & time 08/13/12  Barry Brunner   First MD Initiated Contact with Patient 08/13/12 1950      Chief Complaint  Patient presents with  . Influenza    (Consider location/radiation/quality/duration/timing/severity/associated sxs/prior treatment) HPI Patient reports a cough and a normal ladies for 2 days. She reports to 3 episodes of vomiting today and one episode of diarrhea yesterday. Symptoms accompanied by general malaise. No treatment prior to coming here nothing makes symptoms better or worse. Last dialysis 2 days ago. Past Medical History  Diagnosis Date  . Diabetes mellitus   . Hypercholesterolemia   . Hypothyroidism   . Colitis   . Coronary artery disease   . Hypertension   . GERD (gastroesophageal reflux disease)   . Myocardial infarction   . Peripheral vascular disease   . Pneumonia   . Angina   . H/O Clostridium difficile infection   . Hx of pulmonary embolus   . COPD (chronic obstructive pulmonary disease)   . Shortness of breath     uses O2 as needed  . Chronic kidney disease     M-W-F   [place]    Past Surgical History  Procedure Date  . Tubal ligation   . Amputaton of right fifth toe   . Tonsillectomy   . Insertion of dialysis catheter   . Av fistula placement [id number]    right RUE AVF  . Bvt May 18, 2012    First stage, Basilic Vein Transposition  . Bvt 07/27/12    Left arm Basilic Vein Transposition    Family History  Problem Relation Age of Onset  . Diabetes type II    . Hypertension    . Diabetes Mother   . Kidney disease Mother   . Diabetes Father   . Anesthesia problems Neg Hx     History  Substance Use Topics  . Smoking status: Former Smoker -- 1.0 packs/day for 40 years    Types: Cigarettes    Quit date: 06/11/2011  . Smokeless tobacco: Never Used  . Alcohol Use: No    OB History    Grav Para Term Preterm Abortions TAB SAB Ect Mult Living                  Review of Systems    Constitutional: Negative.   HENT: Negative.   Respiratory: Positive for cough.   Cardiovascular: Negative.   Gastrointestinal: Positive for vomiting and diarrhea.  Musculoskeletal: Positive for myalgias.  Skin: Negative.   Neurological: Negative.   Hematological: Negative.   Psychiatric/Behavioral: Negative.     Allergies  Procaine hcl  Home Medications   Current Outpatient Rx  Name Route Sig Dispense Refill  . AMLODIPINE BESYLATE 10 MG PO TABS Oral Take 10 mg by mouth daily.     . ASPIRIN 81 MG PO CHEW Oral Chew 81 mg by mouth daily.    Marland Kitchen CALCIUM ACETATE 667 MG PO CAPS Oral Take 667 mg by mouth 2 (two) times daily.     Marland Kitchen CLONIDINE HCL 0.2 MG PO TABS Oral Take 0.2 mg by mouth 2 (two) times daily.     Marland Kitchen DARBEPOETIN ALFA-POLYSORBATE 60 MCG/0.3ML IJ SOLN Intravenous Inject 60 mcg into the vein every Friday with hemodialysis.    Marland Kitchen DOXYCYCLINE HYCLATE 100 MG PO TABS Oral Take 100 mg by mouth 2 (two) times daily.    . INSULIN GLARGINE 100 UNIT/ML [place] SOLN Subcutaneous Inject 18 Units into the skin at  bedtime.    . INSULIN SYRINGE 28G X 1/2" 0.5 ML MISC  daily.    . ISOSORBIDE MONONITRATE ER 120 MG PO TB24 Oral Take 120 mg by mouth daily.     Marland Kitchen LEVOTHYROXINE SODIUM 25 MCG PO TABS Oral Take 25 mcg by mouth daily.     Marland Kitchen METOPROLOL TARTRATE 25 MG PO TABS Oral Take 25 mg by mouth 2 (two) times daily.    Marland Kitchen NITROGLYCERIN 0.4 MG SL SUBL Sublingual Place 0.4 mg under the tongue every 5 (five) minutes as needed. For chest pain.    . OXYCODONE HCL 5 MG PO TABS Oral Take 1 tablet (5 mg total) by mouth every 4 (four) hours as needed for pain. 30 tablet 0  . OXYCODONE-ACETAMINOPHEN 5-325 MG PO TABS Oral Take 1 tablet by mouth every 6 (six) hours as needed. For pain.    Marland Kitchen PANTOPRAZOLE SODIUM 40 MG PO TBEC Oral Take 40 mg by mouth daily.     Marland Kitchen PROBIOTIC PO Oral Take 1 capsule by mouth daily.    Marland Kitchen SIMVASTATIN 10 MG PO TABS Oral Take 10 mg by mouth daily at 6 PM.     . FERRIC GLUCONATE IVPB Intravenous  Inject 62.5 mg into the vein every Wednesday with hemodialysis.    . WARFARIN SODIUM 2 MG PO TABS Oral Take 2 mg by mouth daily.       BP 157/49  Pulse 91  Temp 102 F (38.9 C) (Oral)  Resp 22  SpO2 94%  Physical Exam  Nursing note and vitals reviewed. Constitutional:       Chronically ill-appearing  HENT:  Head: Normocephalic and atraumatic.       Membranes dry  Eyes: Conjunctivae normal are normal. Pupils are equal, round, and reactive to light.  Neck: Neck supple. No tracheal deviation present. No thyromegaly present.  Cardiovascular: Normal rate and regular rhythm.   No murmur heard. Pulmonary/Chest: Effort normal and breath sounds normal.       Dialysis catheter at right subclavian area not red warm or tender at the insertion site  Abdominal: Soft. Bowel sounds are normal. She exhibits no distension. There is no tenderness.  Musculoskeletal: Normal range of motion. She exhibits no edema and no tenderness.  Neurological: She is alert. Coordination normal.  Skin: Skin is warm and dry. No rash noted.       Well-healing ulcer left heel  Psychiatric: She has a normal mood and affect.    ED Course  Procedures (including critical care time)   Labs Reviewed  CBC WITH DIFFERENTIAL  URINALYSIS, ROUTINE W REFLEX MICROSCOPIC  COMPREHENSIVE METABOLIC PANEL  CULTURE, BLOOD (ROUTINE X 2)  CULTURE, BLOOD (ROUTINE X 2)  LACTIC ACID, PLASMA   No results found. Results for orders placed during the hospital encounter of 08/13/12  CBC WITH DIFFERENTIAL      Component Value Range   WBC 13.8 (*) 4.0 - 10.5 K/uL   RBC 3.96  3.87 - 5.11 MIL/uL   Hemoglobin 11.4 (*) 12.0 - 15.0 g/dL   HCT 16.1 (*) 09.6 - 04.5 %   MCV 86.4  78.0 - 100.0 fL   MCH 28.8  26.0 - 34.0 pg   MCHC 33.3  30.0 - 36.0 g/dL   RDW 40.9  81.1 - 91.4 %   Platelets 234  150 - 400 K/uL   Neutrophils Relative 91 (*) 43 - 77 %   Neutro Abs 12.5 (*) 1.7 - 7.7 K/uL   Lymphocytes Relative 6 (*)  12 - 46 %   Lymphs Abs  0.9  0.7 - 4.0 K/uL   Monocytes Relative 3  3 - 12 %   Monocytes Absolute 0.4  0.1 - 1.0 K/uL   Eosinophils Relative 0  0 - 5 %   Eosinophils Absolute 0.0  0.0 - 0.7 K/uL   Basophils Relative 0  0 - 1 %   Basophils Absolute 0.0  0.0 - 0.1 K/uL  COMPREHENSIVE METABOLIC PANEL      Component Value Range   Sodium 136  135 - 145 mEq/L   Potassium 3.8  3.5 - 5.1 mEq/L   Chloride 97  96 - 112 mEq/L   CO2 23  19 - 32 mEq/L   Glucose, Bld 200 (*) 70 - 99 mg/dL   BUN 53 (*) 6 - 23 mg/dL   Creatinine, Ser 4.09 (*) 0.50 - 1.10 mg/dL   Calcium 9.2  8.4 - 81.1 mg/dL   Total Protein 6.9  6.0 - 8.3 g/dL   Albumin 3.2 (*) 3.5 - 5.2 g/dL   AST 14  0 - 37 U/L   ALT 9  0 - 35 U/L   Alkaline Phosphatase 103  39 - 117 U/L   Total Bilirubin 0.2 (*) 0.3 - 1.2 mg/dL   GFR calc non Af Amer 9 (*) >90 mL/min   GFR calc Af Amer 11 (*) >90 mL/min  LACTIC ACID, PLASMA      Component Value Range   Lactic Acid, Venous 0.7  0.5 - 2.2 mmol/L   Dg Chest 2 View  08/13/2012  *RADIOLOGY REPORT*  Clinical Data: Influenza.  Fever.  Shortness of breath.  Nausea vomiting.  CHEST - 2 VIEW  Comparison: 04/06/2012  Findings: Heart size is within normal limits.  A tiny right pleural effusion is seen with minimal atelectasis or infiltrate in the posterior right lung base.  Left lung remains clear.  No mass or lymphadenopathy identified.  Right jugular dual lumen center venous catheter remains in appropriate position.  IMPRESSION: Tiny right pleural effusion and minimal atelectasis or infiltrate in the posterior right lung base.   Original Report Authenticated By: Danae Orleans, M.D.      No diagnosis found.  Spoke with hospitalist physician  MDM  Plan admit intravenous antibiotics Diagnosis #1healthcare associated pneumonia #2 hyperglycemia        Doug Sou, MD 08/13/12 2201

## 2012-08-13 NOTE — H&P (Signed)
PCP:   Quentin Mulling, MD  Nephrologist: Dr. Caryn Section and white  Chief Complaint:  Nausea, vomiting and diarrhea  HPI: This is a 71 year old female who lives at home with her son, she states that yesterday she started having nausea, vomiting, diarrhea. No evidence of hematemesis or blood in the stool. She had a fever and a mild cough productive of yellowish phlegm. She does have shortness of breath or wheezing however she states this is chronic and unchanged. She does urinate, she states she has no burning or urination. She states she's feeling weak. Her last diarrhea was yesterday but she did not vomit  several times today including in the ER. Of note the patient states her son had similar symptoms have been of the week and she will be she may have contracted it from him.  Review of Systems:  The patient denies anorexia, weight loss,, vision loss, decreased hearing, hoarseness, chest pain, syncope, dyspnea on exertion, peripheral edema, balance deficits, hemoptysis, abdominal pain, melena, hematochezia, severe indigestion/heartburn, hematuria, incontinence, genital sores, muscle weakness, suspicious skin lesions, transient blindness, difficulty walking, depression, unusual weight change, abnormal bleeding, enlarged lymph nodes, angioedema, and breast masses.  Past Medical History: Past Medical History  Diagnosis Date  . Diabetes mellitus   . Hypercholesterolemia   . Hypothyroidism   . Colitis   . Coronary artery disease   . Hypertension   . GERD (gastroesophageal reflux disease)   . Myocardial infarction   . Peripheral vascular disease   . Pneumonia   . Angina   . H/O Clostridium difficile infection   . Hx of pulmonary embolus   . COPD (chronic obstructive pulmonary disease)   . Shortness of breath     uses O2 as needed  . Chronic kidney disease     M-W-F   [place]   Past Surgical History  Procedure Date  . Tubal ligation   . Amputaton of right fifth toe   . Tonsillectomy   .  Insertion of dialysis catheter   . Av fistula placement [id number]    right RUE AVF  . Bvt May 18, 2012    First stage, Basilic Vein Transposition  . Bvt 07/27/12    Left arm Basilic Vein Transposition    Medications: Prior to Admission medications   Medication Sig Start Date End Date Taking? Authorizing Provider  amLODipine (NORVASC) 10 MG tablet Take 10 mg by mouth daily.  04/20/12  Yes Historical Provider, MD  aspirin 81 MG chewable tablet Chew 81 mg by mouth daily. 11/01/11 10/31/12 Yes Clydia Llano, MD  calcium acetate (PHOSLO) 667 MG capsule Take 667 mg by mouth 2 (two) times daily.  11/01/11 10/31/12 Yes Clydia Llano, MD  cloNIDine (CATAPRES) 0.2 MG tablet Take 0.2 mg by mouth 2 (two) times daily.  04/27/12  Yes Historical Provider, MD  insulin glargine (LANTUS) 100 UNIT/ML injection Inject 18 Units into the skin at bedtime. 11/01/11  Yes Clydia Llano, MD  isosorbide mononitrate (IMDUR) 120 MG 24 hr tablet Take 120 mg by mouth daily.    Yes Historical Provider, MD  levothyroxine (SYNTHROID, LEVOTHROID) 25 MCG tablet Take 25 mcg by mouth daily.    Yes Historical Provider, MD  metoprolol tartrate (LOPRESSOR) 25 MG tablet Take 25 mg by mouth 2 (two) times daily. 02/14/12 02/13/13 Yes Marinda Elk, MD  nitroGLYCERIN (NITROSTAT) 0.4 MG SL tablet Place 0.4 mg under the tongue every 5 (five) minutes as needed. For chest pain.   Yes Historical Provider, MD  pantoprazole (PROTONIX)  40 MG tablet Take 40 mg by mouth daily.    Yes Historical Provider, MD  Probiotic Product (PROBIOTIC PO) Take 1 capsule by mouth daily.   Yes Historical Provider, MD  simvastatin (ZOCOR) 10 MG tablet Take 10 mg by mouth daily at 6 PM.  02/14/12 02/13/13 Yes Marinda Elk, MD  warfarin (COUMADIN) 2 MG tablet Take 2 mg by mouth daily.  02/14/12  Yes Marinda Elk, MD  Insulin Syringe-Needle U-100 (INSULIN SYRINGE .5CC/28G) 28G X 1/2" 0.5 ML MISC daily. 08/03/12   Historical Provider, MD    Allergies:     Allergies  Allergen Reactions  . Procaine Hcl Nausea And Vomiting and Other (See Comments)    "Pass out"    Social History:  reports that she quit smoking about 14 months ago. Her smoking use included Cigarettes. She has a 40 pack-year smoking history. She has never used smokeless tobacco. She reports that she does not drink alcohol or use illicit drugs.  Family History: Family History  Problem Relation Age of Onset  . Diabetes type II    . Hypertension    . Diabetes Mother   . Kidney disease Mother   . Diabetes Father   . Anesthesia problems Neg Hx     Physical Exam: Filed Vitals:   08/13/12 1946 08/13/12 2000 08/13/12 2015  BP: 158/49 157/49 159/49  Pulse: 94 91 90  Temp: 102 F (38.9 C)    TempSrc: Oral    Resp: 27 22 24   SpO2: 93% 94% 93%    General:  Alert and oriented times three, well developed and nourished, weak Eyes: PERRLA, pink conjunctiva, no scleral icterus ENT dry oral mucosa, neck supple, no thyromegaly Lungs: clear to ascultation, no wheeze, no crackles, no use of accessory muscles Cardiovascular: regular rate and rhythm, no regurgitation, no gallops, no murmurs. No carotid bruits, no JVD Abdomen: soft, positive BS, non-tender, non-distended, no organomegaly, not an acute abdomen GU: not examined Neuro: CN II - XII grossly intact, sensation intact Musculoskeletal: strength 5/5 all extremities, no clubbing, cyanosis or edema Skin: no rash, no subcutaneous crepitation, no decubitus   Labs on Admission:   Basename 08/13/12 2014  NA 136  K 3.8  CL 97  CO2 23  GLUCOSE 200*  BUN 53*  CREATININE 4.51*  CALCIUM 9.2  MG --  PHOS --    Basename 08/13/12 2014  AST 14  ALT 9  ALKPHOS 103  BILITOT 0.2*  PROT 6.9  ALBUMIN 3.2*   No results found for this basename: LIPASE:2,AMYLASE:2 in the last 72 hours  Basename 08/13/12 2014  WBC 13.8*  NEUTROABS 12.5*  HGB 11.4*  HCT 34.2*  MCV 86.4  PLT 234   No results found for this basename:  CKTOTAL:3,CKMB:3,CKMBINDEX:3,TROPONINI:3 in the last 72 hours No components found with this basename: POCBNP:3 No results found for this basename: DDIMER:2 in the last 72 hours No results found for this basename: HGBA1C:2 in the last 72 hours No results found for this basename: CHOL:2,HDL:2,LDLCALC:2,TRIG:2,CHOLHDL:2,LDLDIRECT:2 in the last 72 hours No results found for this basename: TSH,T4TOTAL,FREET3,T3FREE,THYROIDAB in the last 72 hours No results found for this basename: VITAMINB12:2,FOLATE:2,FERRITIN:2,TIBC:2,IRON:2,RETICCTPCT:2 in the last 72 hours  Micro Results: No results found for this or any previous visit (from the past 240 hour(s)).   Radiological Exams on Admission: Dg Chest 2 View  08/13/2012  *RADIOLOGY REPORT*  Clinical Data: Influenza.  Fever.  Shortness of breath.  Nausea vomiting.  CHEST - 2 VIEW  Comparison: 04/06/2012  Findings: Heart size is within normal limits.  A tiny right pleural effusion is seen with minimal atelectasis or infiltrate in the posterior right lung base.  Left lung remains clear.  No mass or lymphadenopathy identified.  Right jugular dual lumen center venous catheter remains in appropriate position.  IMPRESSION: Tiny right pleural effusion and minimal atelectasis or infiltrate in the posterior right lung base.   Original Report Authenticated By: Danae Orleans, M.D.     Assessment/Plan Present on Admission:  Gastroenteritis/clinical dehydration Bring in for 23 hr observation Antiemetics ordered, C. difficile. Patient did receive some IV hydration 1 L by the ER physician  Blood cultures collected, urine cultures ordered  .Healthcare-associated pneumonia Patient asymptomatic, monitor  Vancomycin was started in the ER, this has not been discontinued  COPD Chronic respiratory failure  .End stage renal disease .HTN (hypertension) .Hypercholesterolemia .Hypothyroidism .CAD (coronary artery disease) History of PE Stable resume home  medications Oxygen to keep sats greater than 88% Pharmacy to manage Coumadin Consult nephrology in a.m.  Full code DVT prophylaxis ot needed   Sayward Horvath 08/13/2012, 10:21 PM

## 2012-08-13 NOTE — ED Notes (Signed)
MD at bedside. 

## 2012-08-13 NOTE — ED Notes (Addendum)
Per EMS: Complains of  Flu like symptoms yesterday and last night. Weakness cough, low grade fever. Denies pain. Missed dialysis today. Bp 160/78. Pulse 97.  Respirations 97% on 3L. CBG 250 Hxt DM, hypertension. COPD. CHF.  Vomit x 1. 4 mg oral Zofran. Left arm shunt access. Right chest.

## 2012-08-13 NOTE — ED Provider Notes (Signed)
Sign out received from Dr Ethelda Chick.  Patient to move to CDU for holding and admission.  Pt is a febrile dialysis patient, concern for sepsis.  PCP is Dr Quintella Reichert.  Will be unassigned admission.   9:59 PM Patient admitted to [facility] hospitalist service prior to her arrival in CDU.    [place], [place] 08/13/12 2338

## 2012-08-13 NOTE — ED Notes (Signed)
Off the floor to radiology

## 2012-08-13 NOTE — ED Provider Notes (Signed)
Medical screening examination/treatment/procedure(s) were conducted as a shared visit with non-physician practitioner(s) and myself.  I personally evaluated the patient during the encounter  Doug Sou, MD 08/13/12 [phone]

## 2012-08-13 NOTE — ED Notes (Addendum)
Pt A.O. X 4. Reports weakness, SOB, fever, and cough x 3 days. Missed dialysis today due to weakness. Normally scheduled from MWF. Denies pain. Denies SOB. Placed on 3L [place].  Pt has hxt of COPD. Dialysis accesses point in left arm. Access point in right chest.

## 2012-08-14 ENCOUNTER — Inpatient Hospital Stay (HOSPITAL_COMMUNITY): Payer: Medicare Other

## 2012-08-14 DIAGNOSIS — J189 Pneumonia, unspecified organism: Secondary | ICD-10-CM

## 2012-08-14 LAB — BASIC METABOLIC PANEL
Calcium: 9.6 mg/dL (ref 8.4–10.5)
GFR calc non Af Amer: 9 mL/min — ABNORMAL LOW (ref >90)
Glucose, Bld: 60 mg/dL — ABNORMAL LOW (ref 70–99)
Sodium: 139 mEq/L (ref 135–145)

## 2012-08-14 LAB — CBC
MCH: 29.2 pg (ref 26.0–34.0)
Platelets: 235 10*3/uL (ref 150–400)
RBC: 4.73 MIL/uL (ref 3.87–5.11)
RDW: 14.3 % (ref 11.5–15.5)

## 2012-08-14 LAB — URINALYSIS, MICROSCOPIC ONLY
Bilirubin Urine: NEGATIVE
Glucose, UA: NEGATIVE mg/dL
Ketones, ur: NEGATIVE mg/dL
Leukocytes, UA: NEGATIVE
Protein, ur: 100 mg/dL — AB

## 2012-08-14 LAB — GLUCOSE, CAPILLARY
Glucose-Capillary: 109 mg/dL — ABNORMAL HIGH (ref 70–99)
Glucose-Capillary: 93 mg/dL (ref 70–99)

## 2012-08-14 LAB — PROTIME-INR: Prothrombin Time: 21.4 seconds — ABNORMAL HIGH (ref 11.6–15.2)

## 2012-08-14 MED ORDER — HYDROCODONE-ACETAMINOPHEN 5-325 MG PO TABS
ORAL_TABLET | ORAL | Status: AC
  Administered 2012-08-14: 2 via ORAL
  Filled 2012-08-14: qty 2

## 2012-08-14 MED ORDER — VANCOMYCIN HCL 1000 MG IV SOLR: 750.0000 mg | INTRAVENOUS | Status: DC

## 2012-08-14 MED ORDER — HEPARIN SODIUM (PORCINE) 1000 UNIT/ML DIALYSIS
20.0000 [IU]/kg | INTRAMUSCULAR | Status: DC | PRN
  Administered 2012-08-14: 1200 [IU] via INTRAVENOUS_CENTRAL
  Filled 2012-08-14: qty 2

## 2012-08-14 MED ORDER — VANCOMYCIN HCL 500 MG IV SOLR
500.0000 mg | Freq: Once | INTRAVENOUS | Status: AC
  Administered 2012-08-14: 500 mg via INTRAVENOUS
  Filled 2012-08-14: qty 500

## 2012-08-14 MED ORDER — WARFARIN - PHARMACIST DOSING INPATIENT
Freq: Every day | Status: DC
  Administered 2012-08-14: 18:00:00

## 2012-08-14 NOTE — Progress Notes (Signed)
ANTICOAGULATION/ANTIBIOTIC CONSULT NOTE - Initial Consult  Pharmacy Consult for Vancocin and Coumadin Indication: HCAP and h/o PE  Allergies  Allergen Reactions  . Procaine Hcl Nausea And Vomiting and Other (See Comments)    "Pass out"    Patient Measurements: Height: 5\' 5"  (165.1 cm) Weight: 136 lb (61.689 kg) IBW/kg (Calculated) : 57   Vital Signs: Temp: 99.7 F (37.6 C) (10/11 2352) Temp src: Oral (10/11 2352) BP: 128/46 mmHg (10/11 2352) Pulse Rate: 90  (10/11 2352)  Labs:  Basename 08/13/12 2202 08/13/12 2014  HGB -- 11.4*  HCT -- 34.2*  PLT -- 234  APTT -- --  LABPROT 24.9* --  INR 2.38* --  HEPARINUNFRC -- --  CREATININE -- 4.51*  CKTOTAL -- --  CKMB -- --  TROPONINI -- --    Estimated Creatinine Clearance: 10.6 ml/min (by C-G formula based on Cr of 4.51).   Medical History: Past Medical History  Diagnosis Date  . Diabetes mellitus   . Hypercholesterolemia   . Hypothyroidism   . Colitis   . Coronary artery disease   . Hypertension   . GERD (gastroesophageal reflux disease)   . Myocardial infarction   . Peripheral vascular disease   . Pneumonia   . Angina   . H/O Clostridium difficile infection   . Hx of pulmonary embolus   . COPD (chronic obstructive pulmonary disease)   . Shortness of breath     uses O2 as needed  . Chronic kidney disease     M-W-F   [place]    Medications:  Prescriptions prior to admission  Medication Sig Dispense Refill  . amLODipine (NORVASC) 10 MG tablet Take 10 mg by mouth daily.       Marland Kitchen aspirin 81 MG chewable tablet Chew 81 mg by mouth daily.      . calcium acetate (PHOSLO) 667 MG capsule Take 667 mg by mouth 2 (two) times daily.       . cloNIDine (CATAPRES) 0.2 MG tablet Take 0.2 mg by mouth 2 (two) times daily.       . insulin glargine (LANTUS) 100 UNIT/ML injection Inject 18 Units into the skin at bedtime.      . isosorbide mononitrate (IMDUR) 120 MG 24 hr tablet Take 120 mg by mouth daily.       Marland Kitchen  levothyroxine (SYNTHROID, LEVOTHROID) 25 MCG tablet Take 25 mcg by mouth daily.       . metoprolol tartrate (LOPRESSOR) 25 MG tablet Take 25 mg by mouth 2 (two) times daily.      . nitroGLYCERIN (NITROSTAT) 0.4 MG SL tablet Place 0.4 mg under the tongue every 5 (five) minutes as needed. For chest pain.      . pantoprazole (PROTONIX) 40 MG tablet Take 40 mg by mouth daily.       . Probiotic Product (PROBIOTIC PO) Take 1 capsule by mouth daily.      . simvastatin (ZOCOR) 10 MG tablet Take 10 mg by mouth daily at 6 PM.       . warfarin (COUMADIN) 2 MG tablet Take 2 mg by mouth daily.       . Insulin Syringe-Needle U-100 (INSULIN SYRINGE .5CC/28G) 28G X 1/2" 0.5 ML MISC daily.       Scheduled:    . acetaminophen  650 mg Oral Once  . amLODipine  10 mg Oral Daily  . aspirin  81 mg Oral Daily  . calcium acetate  667 mg Oral BID WC  . cloNIDine  0.2 mg Oral BID  . insulin aspart  0-15 Units Subcutaneous TID WC  . insulin aspart  0-5 Units Subcutaneous QHS  . insulin glargine  18 Units Subcutaneous QHS  . isosorbide mononitrate  120 mg Oral Daily  . levothyroxine  25 mcg Oral Q0600  . metoprolol tartrate  25 mg Oral BID  . pantoprazole  40 mg Oral Q1200  . piperacillin-tazobactam (ZOSYN)  IV  3.375 g Intravenous Once  . simvastatin  10 mg Oral q1800  . sodium chloride  1,000 mL Intravenous Once  . vancomycin  500 mg Intravenous Once  . vancomycin  750 mg Intravenous Q M,W,F-HD  . vancomycin  1,000 mg Intravenous Once  . warfarin  2 mg Oral Daily  . Warfarin - Pharmacist Dosing Inpatient   Does not apply q1800    Assessment: 71yo female c/o N/V/D with fever and mild productive cough, SOB unchanged from chronic condition, CXR shows small effusion vs infiltrate, to begin IV ABX for possible PNA; also to continue Coumadin for h/o PE, admitted with therapeutic INR.  Goal of Therapy:  INR 2-3 Pre-HD vanc level 15-25   Plan:  Rec'd vanc 1g in ED; will give additional vancomycin 500mg  to  complete load then begin 750mg  IV after each HD and monitor CBC, Cx, levels prn; will continue home Coumadin dose of 2mg  daily and monitor INR.  Colleen Can PharmD BCPS 08/14/2012,12:11 AM

## 2012-08-14 NOTE — Progress Notes (Signed)
Pt seen and examined, admitted this am by Dr.Crossley, with N/V/Diarrhea/Fevers yesterday PMH of ESRD on HD MWF missed HD yesterday, COPD on home O2, CAD, DM, h/o PE on coumadin Plan: notify renal of admission for HD today since missed HD yesterday Clinically improved , received Vancomycin in ER yesterday C diff PCR pending, FU blood cultures Had AVF placed by [facility] 3 weeks ago, being dialyzed by HD catheter currently Continue coumadin for h/o  PE  Zannie Cove, MD [phone]

## 2012-08-14 NOTE — Consult Note (Signed)
[facility] Renal Consultation Note  Indication for Consultation:  Management of ESRD/hemodialysis; anemia, hypertension/volume and secondary hyperparathyroidism  HPI: [name] [name] is a 71 y.o. female with ESRD on dialysis on MWF in [place] who presented to the ED last night after one day of nausea, vomiting, and diarrhea with low-grade fevers and weakness.  She also reports occasional cough with yellow sputum, but chest x-ray showed only a tiny right pleural effusion with minimal atelectasis or infiltrate in the posterior right lung base.  She has had no vomiting or diarrhea today and is currently stable.     Dialysis Orders: Center: [place] on MWF . EDW 60.5 kg  HD Bath 2K/2.5Ca  Time 4 hrs  Heparin 1200 U. Access Right IJ catheter, AVF @ LUA  BFR 400 DFR A1.5    Zemplar 0 mcg IV/HD   Epogen 0 Units IV/HD  Venofer 0  Past Medical History  Diagnosis Date  . Diabetes mellitus   . Hypercholesterolemia   . Hypothyroidism   . Colitis   . Coronary artery disease   . Hypertension   . GERD (gastroesophageal reflux disease)   . Myocardial infarction   . Peripheral vascular disease   . Pneumonia   . Angina   . H/O Clostridium difficile infection   . Hx of pulmonary embolus   . COPD (chronic obstructive pulmonary disease)   . Shortness of breath     uses O2 as needed  . Chronic kidney disease     M-W-F   [place]   Past Surgical History  Procedure Date  . Tubal ligation   . Amputaton of right fifth toe   . Tonsillectomy   . Insertion of dialysis catheter   . Av fistula placement [id number]    right RUE AVF  . Bvt May 18, 2012    First stage, Basilic Vein Transposition  . Bvt 07/27/12    Left arm Basilic Vein Transposition   Family History  Problem Relation Age of Onset  . Diabetes type II    . Hypertension    . Diabetes Mother   . Kidney disease Mother   . Diabetes Father   . Anesthesia problems Neg Hx    Social History She reports that she quit smoking about  14 months ago after smoking about a pack of cigarettes a week.  She has never used smokeless tobacco. She reports that she does not drink alcohol or use illicit drugs.  She previously worked at a [employer] and is married with two children.  Allergies  Allergen Reactions  . Procaine Hcl Nausea And Vomiting and Other (See Comments)    "Pass out"   Prior to Admission medications   Medication Sig Start Date End Date Taking? Authorizing Provider  amLODipine (NORVASC) 10 MG tablet Take 10 mg by mouth daily.  04/20/12  Yes Historical Provider, MD  aspirin 81 MG chewable tablet Chew 81 mg by mouth daily. 11/01/11 10/31/12 Yes Clydia Llano, MD  calcium acetate (PHOSLO) 667 MG capsule Take 667 mg by mouth 2 (two) times daily.  11/01/11 10/31/12 Yes Clydia Llano, MD  cloNIDine (CATAPRES) 0.2 MG tablet Take 0.2 mg by mouth 2 (two) times daily.  04/27/12  Yes Historical Provider, MD  insulin glargine (LANTUS) 100 UNIT/ML injection Inject 18 Units into the skin at bedtime. 11/01/11  Yes Clydia Llano, MD  isosorbide mononitrate (IMDUR) 120 MG 24 hr tablet Take 120 mg by mouth daily.    Yes Historical Provider, MD  levothyroxine (SYNTHROID,  LEVOTHROID) 25 MCG tablet Take 25 mcg by mouth daily.    Yes Historical Provider, MD  metoprolol tartrate (LOPRESSOR) 25 MG tablet Take 25 mg by mouth 2 (two) times daily. 02/14/12 02/13/13 Yes Marinda Elk, MD  nitroGLYCERIN (NITROSTAT) 0.4 MG SL tablet Place 0.4 mg under the tongue every 5 (five) minutes as needed. For chest pain.   Yes Historical Provider, MD  pantoprazole (PROTONIX) 40 MG tablet Take 40 mg by mouth daily.    Yes Historical Provider, MD  Probiotic Product (PROBIOTIC PO) Take 1 capsule by mouth daily.   Yes Historical Provider, MD  simvastatin (ZOCOR) 10 MG tablet Take 10 mg by mouth daily at 6 PM.  02/14/12 02/13/13 Yes Marinda Elk, MD  warfarin (COUMADIN) 2 MG tablet Take 2 mg by mouth daily.  02/14/12  Yes Marinda Elk, MD  Insulin  Syringe-Needle U-100 (INSULIN SYRINGE .5CC/28G) 28G X 1/2" 0.5 ML MISC daily. 08/03/12   Historical Provider, MD   Labs:  Results for orders placed during the hospital encounter of 08/13/12 (from the past 48 hour(s))  CBC WITH DIFFERENTIAL     Status: Abnormal   Collection Time   08/13/12  8:14 PM      Component Value Range Comment   WBC 13.8 (*) 4.0 - 10.5 K/uL    RBC 3.96  3.87 - 5.11 MIL/uL    Hemoglobin 11.4 (*) 12.0 - 15.0 g/dL    HCT 96.0 (*) 45.4 - 46.0 %    MCV 86.4  78.0 - 100.0 fL    MCH 28.8  26.0 - 34.0 pg    MCHC 33.3  30.0 - 36.0 g/dL    RDW 09.8  11.9 - 14.7 %    Platelets 234  150 - 400 K/uL    Neutrophils Relative 91 (*) 43 - 77 %    Neutro Abs 12.5 (*) 1.7 - 7.7 K/uL    Lymphocytes Relative 6 (*) 12 - 46 %    Lymphs Abs 0.9  0.7 - 4.0 K/uL    Monocytes Relative 3  3 - 12 %    Monocytes Absolute 0.4  0.1 - 1.0 K/uL    Eosinophils Relative 0  0 - 5 %    Eosinophils Absolute 0.0  0.0 - 0.7 K/uL    Basophils Relative 0  0 - 1 %    Basophils Absolute 0.0  0.0 - 0.1 K/uL   COMPREHENSIVE METABOLIC PANEL     Status: Abnormal   Collection Time   08/13/12  8:14 PM      Component Value Range Comment   Sodium 136  135 - 145 mEq/L    Potassium 3.8  3.5 - 5.1 mEq/L    Chloride 97  96 - 112 mEq/L    CO2 23  19 - 32 mEq/L    Glucose, Bld 200 (*) 70 - 99 mg/dL    BUN 53 (*) 6 - 23 mg/dL    Creatinine, Ser 8.29 (*) 0.50 - 1.10 mg/dL    Calcium 9.2  8.4 - 56.2 mg/dL    Total Protein 6.9  6.0 - 8.3 g/dL    Albumin 3.2 (*) 3.5 - 5.2 g/dL    AST 14  0 - 37 U/L    ALT 9  0 - 35 U/L    Alkaline Phosphatase 103  39 - 117 U/L    Total Bilirubin 0.2 (*) 0.3 - 1.2 mg/dL    GFR calc non Af Amer 9 (*) >90  mL/min    GFR calc Af Amer 11 (*) >90 mL/min   LACTIC ACID, PLASMA     Status: Normal   Collection Time   08/13/12  8:40 PM      Component Value Range Comment   Lactic Acid, Venous 0.7  0.5 - 2.2 mmol/L   PROTIME-INR     Status: Abnormal   Collection Time   08/13/12 10:02 PM       Component Value Range Comment   Prothrombin Time 24.9 (*) 11.6 - 15.2 seconds    INR 2.38 (*) 0.00 - 1.49   GLUCOSE, CAPILLARY     Status: Abnormal   Collection Time   08/13/12 11:49 PM      Component Value Range Comment   Glucose-Capillary 252 (*) 70 - 99 mg/dL   BASIC METABOLIC PANEL     Status: Abnormal   Collection Time   08/14/12  6:50 AM      Component Value Range Comment   Sodium 139  135 - 145 mEq/L    Potassium 4.2  3.5 - 5.1 mEq/L    Chloride 97  96 - 112 mEq/L    CO2 22  19 - 32 mEq/L    Glucose, Bld 60 (*) 70 - 99 mg/dL    BUN 57 (*) 6 - 23 mg/dL    Creatinine, Ser 1.61 (*) 0.50 - 1.10 mg/dL    Calcium 9.6  8.4 - 09.6 mg/dL    GFR calc non Af Amer 9 (*) >90 mL/min    GFR calc Af Amer 10 (*) >90 mL/min   CBC     Status: Abnormal   Collection Time   08/14/12  6:50 AM      Component Value Range Comment   WBC 16.7 (*) 4.0 - 10.5 K/uL    RBC 4.73  3.87 - 5.11 MIL/uL    Hemoglobin 13.8  12.0 - 15.0 g/dL DELTA CHECK NOTED   HCT 41.4  36.0 - 46.0 %    MCV 87.5  78.0 - 100.0 fL    MCH 29.2  26.0 - 34.0 pg    MCHC 33.3  30.0 - 36.0 g/dL    RDW 04.5  40.9 - 81.1 %    Platelets 235  150 - 400 K/uL   PROTIME-INR     Status: Abnormal   Collection Time   08/14/12  6:50 AM      Component Value Range Comment   Prothrombin Time 21.4 (*) 11.6 - 15.2 seconds    INR 1.94 (*) 0.00 - 1.49   GLUCOSE, CAPILLARY     Status: Normal   Collection Time   08/14/12  8:00 AM      Component Value Range Comment   Glucose-Capillary 78  70 - 99 mg/dL   URINALYSIS, MICROSCOPIC ONLY     Status: Abnormal   Collection Time   08/14/12  8:52 AM      Component Value Range Comment   Color, Urine YELLOW  YELLOW    APPearance CLOUDY (*) CLEAR    Specific Gravity, Urine 1.017  1.005 - 1.030    pH 5.5  5.0 - 8.0    Glucose, UA NEGATIVE  NEGATIVE mg/dL    Hgb urine dipstick TRACE (*) NEGATIVE    Bilirubin Urine NEGATIVE  NEGATIVE    Ketones, ur NEGATIVE  NEGATIVE mg/dL    Protein, ur 914  (*) NEGATIVE mg/dL    Urobilinogen, UA 0.2  0.0 - 1.0 mg/dL    Nitrite NEGATIVE  NEGATIVE    Leukocytes, UA NEGATIVE  NEGATIVE    WBC, UA 0-2  <3 WBC/hpf    RBC / HPF 0-2  <3 RBC/hpf    Bacteria, UA FEW (*) RARE    Squamous Epithelial / LPF MANY (*) RARE    Constitutional: positive for fevers; negative for chills, weight loss Ears, nose, mouth, throat, and face: negative for earaches, hearing loss, hoarseness, nasal congestion and sore throat Respiratory: positive for cough and sputum, negative for dyspnea on exertion, hemoptysis and wheezing Cardiovascular: negative for chest pain, dyspnea, orthopnea and palpitations Gastrointestinal: positive for diarrhea, nausea and vomiting Genitourinary:negative, oliguric Musculoskeletal:negative for arthralgias, back pain, myalgias and neck pain Neurological: negative for coordination problems, dizziness, headaches and speech problems  Physical Exam: Filed Vitals:   08/14/12 1216  BP: 167/59  Pulse: 103  Temp: 100.1 F (37.8 C)  Resp: 18     General appearance: alert, cooperative and no distress Head: Normocephalic, without obvious abnormality, atraumatic Neck: no adenopathy, no carotid bruit, no JVD and supple, symmetrical, trachea midline Resp: clear to auscultation bilaterally Cardio: regular rate and rhythm, S1, S2 normal, no murmur, click, rub or gallop GI: soft, non-tender; bowel sounds normal; no masses,  no organomegaly Extremities: extremities normal, atraumatic, no cyanosis or edema Neurologic: Grossly normal Dialysis Access: Right IJ catheter, AVF @ LUA with + bruit  Assessment/Plan: 1. Nausea/vomiting/diarrhea - with low-grade fever, likely gastroenteritis, blood cultures and urine cultures pending, chest x-ray with tiny right pleural effusion with minimal atelectasis or infiltrate in posterior right lung base; Vancomycin started, on antiemetics. 2. ESRD -  HD on MWF @ [place]; K 4.2.  HD pending. 3. Hypertension/volume  -  BP 167/59, on Clonidine 0.2 mg bid, Metoprolol 25 mg bid, and Amlodipine 10 mg qd; on hold until HD today; current wt 61.7 kg with EDW 60.5 kg.   4. Anemia  - Hgb 13.8, no outpatient Epogen or Fe. 5. Metabolic bone disease -  Ca 9.6 (10.4 corrected), last P 3.8, no Zemplar, on Phoslo 1 with meals. 6. Nutrition - Alb 3.2. 7. New access - s/p left 1st stage BVT 7/16, 2nd stage BVT 9/24 per Dr. Imogene Burn. 8. 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 9. Hypothyroidism - on Synthroid.  LYLES,CHARLES 08/14/2012, 12:35 PM   Attending Nephrologist: Delano Metz, MD   Patient seen and examined and agree with assessment and plan as above.  [name]  MD [facility] [phone] pgr    [phone] cell 08/14/2012, 4:30 PM

## 2012-08-15 DIAGNOSIS — R7881 Bacteremia: Secondary | ICD-10-CM

## 2012-08-15 DIAGNOSIS — Z86711 Personal history of pulmonary embolism: Secondary | ICD-10-CM

## 2012-08-15 DIAGNOSIS — N186 End stage renal disease: Secondary | ICD-10-CM

## 2012-08-15 LAB — CBC
MCH: 28.5 pg (ref 26.0–34.0)
MCHC: 32.2 g/dL (ref 30.0–36.0)
Platelets: 216 10*3/uL (ref 150–400)

## 2012-08-15 LAB — CLOSTRIDIUM DIFFICILE BY PCR: Toxigenic C. Difficile by PCR: NEGATIVE

## 2012-08-15 LAB — PROTIME-INR: Prothrombin Time: 24.8 seconds — ABNORMAL HIGH (ref 11.6–15.2)

## 2012-08-15 LAB — GLUCOSE, CAPILLARY: Glucose-Capillary: 163 mg/dL — ABNORMAL HIGH (ref 70–99)

## 2012-08-15 MED ORDER — HEPARIN SODIUM (PORCINE) 1000 UNIT/ML DIALYSIS
20.0000 [IU]/kg | INTRAMUSCULAR | Status: DC | PRN
  Administered 2012-08-16: 1200 [IU] via INTRAVENOUS_CENTRAL

## 2012-08-15 MED ORDER — VANCOMYCIN HCL 500 MG IV SOLR
500.0000 mg | INTRAVENOUS | Status: DC
  Administered 2012-08-16 – 2012-08-18 (×2): 500 mg via INTRAVENOUS
  Filled 2012-08-15 (×5): qty 500

## 2012-08-15 MED ORDER — SODIUM CHLORIDE 0.9 % IV SOLN
500.0000 mg | Freq: Once | INTRAVENOUS | Status: AC
  Administered 2012-08-15: 500 mg via INTRAVENOUS
  Filled 2012-08-15: qty 500

## 2012-08-15 MED ORDER — VANCOMYCIN HCL 1000 MG IV SOLR: 750.0000 mg | INTRAVENOUS | Status: DC

## 2012-08-15 MED ORDER — INSULIN GLARGINE 100 UNIT/ML ~~LOC~~ SOLN
12.0000 [IU] | Freq: Every day | SUBCUTANEOUS | Status: DC
  Administered 2012-08-15 – 2012-08-22 (×8): 12 [IU] via SUBCUTANEOUS

## 2012-08-15 NOTE — Progress Notes (Signed)
CRITICAL VALUE ALERT  Critical value received:  Blood cultures drawn on 10/11 2030 is + for gram+ cocci clusters  Date of notification:  08/15/12  Time of notification:  0126  Critical value read back:yes  Nurse who received alert:  Minna Merritts RN  MD notified (1st page):  L. Harduk  Time of first page:  0147

## 2012-08-15 NOTE — Progress Notes (Signed)
  ANTICOAGULATION CONSULT NOTE - Follow Up Consult  Pharmacy Consult for Coumadin Indication: Hx PE  Allergies  Allergen Reactions  . Procaine Hcl Nausea And Vomiting and Other (See Comments)    "Pass out"    Patient Measurements: Height: 5\' 5"  (165.1 cm) Weight: 133 lb 13.1 oz (60.7 kg) IBW/kg (Calculated) : 57  Heparin Dosing Weight:   Vital Signs: Temp: 98.5 F (36.9 C) (10/13 0914) Temp src: Oral (10/13 0914) BP: 137/54 mmHg (10/13 0914) Pulse Rate: 79  (10/13 0914)  Labs:  Basename 08/15/12 0535 08/14/12 0650 08/13/12 2202 08/13/12 2014  HGB 10.7* 13.8 -- --  HCT 33.2* 41.4 -- 34.2*  PLT 216 235 -- 234  APTT -- -- -- --  LABPROT 24.8* 21.4* 24.9* --  INR 2.37* 1.94* 2.38* --  HEPARINUNFRC -- -- -- --  CREATININE -- 4.60* -- 4.51*  CKTOTAL -- -- -- --  CKMB -- -- -- --  TROPONINI -- -- -- --    Estimated Creatinine Clearance: 10.4 ml/min (by C-G formula based on Cr of 4.6).  Assessment: 71yof continuing Coumadin for hx PE. INR (2.37) is therapeutic on home regimen (2mg  daily)- will continue. - H/H and Plts trending down - No significant bleeding reported  Goal of Therapy:  INR 2-3   Plan:  1. Continue Coumadin 2mg  po daily @ 1800 2. Follow-up AM INR 3. Adjust ongoing Vancomycin regime to 500mg  IV qHD (MWF)  Cleon Dew [phone] 08/15/2012,1:11 PM

## 2012-08-15 NOTE — Progress Notes (Signed)
[facility]             Progress Note   Subjective: Doing better, no further vomiting, diarrhea improved, low grade temp yesterday  Objective: Vital signs in last 24 hours: Temp:  [98.5 F (36.9 C)-100.6 F (38.1 C)] 98.5 F (36.9 C) (10/13 0914) Pulse Rate:  [79-103] 79  (10/13 0914) Resp:  [16-18] 17  (10/13 0914) BP: (121-184)/(43-75) 137/54 mmHg (10/13 0914) SpO2:  [92 %-96 %] 92 % (10/13 0914) Weight:  [60.7 kg (133 lb 13.1 oz)-62.5 kg (137 lb 12.6 oz)] 60.7 kg (133 lb 13.1 oz) (10/12 2214) Weight change: 0.811 kg (1 lb 12.6 oz) Last BM Date: 08/06/12  Intake/Output from previous day: 10/12 0701 - 10/13 0700 In: 400 [P.O.:400] Out: 1997 [Urine:600] Total I/O In: 360 [P.O.:360] Out: -    Physical Exam: General: Alert, awake, oriented x3, in no acute distress. HEENT: No bruits, no goiter. R IJ HD catheter Heart: Regular rate and rhythm, without murmurs, rubs, gallops. Lungs: Clear to auscultation bilaterally. Abdomen: Soft, nontender, nondistended, positive bowel sounds. Extremities: No clubbing cyanosis or edema with positive pedal pulses, LUE AVF Neuro: Grossly intact, nonfocal.    Lab Results: Basic Metabolic Panel:  Basename 08/14/12 0650 08/13/12 2014  NA 139 136  K 4.2 3.8  CL 97 97  CO2 22 23  GLUCOSE 60* 200*  BUN 57* 53*  CREATININE 4.60* 4.51*  CALCIUM 9.6 9.2  MG -- --  PHOS -- --   Liver Function Tests:  Basename 08/13/12 2014  AST 14  ALT 9  ALKPHOS 103  BILITOT 0.2*  PROT 6.9  ALBUMIN 3.2*   No results found for this basename: LIPASE:2,AMYLASE:2 in the last 72 hours No results found for this basename: AMMONIA:2 in the last 72 hours CBC:  Basename 08/15/12 0535 08/14/12 0650 08/13/12 2014  WBC 9.8 16.7* --  NEUTROABS -- -- 12.5*  HGB 10.7* 13.8 --  HCT 33.2* 41.4 --  MCV 88.3 87.5 --  PLT 216 235 --   Cardiac Enzymes: No results found for this basename: CKTOTAL:3,CKMB:3,CKMBINDEX:3,TROPONINI:3 in the last 72  hours BNP: No results found for this basename: PROBNP:3 in the last 72 hours D-Dimer: No results found for this basename: DDIMER:2 in the last 72 hours CBG:  Basename 08/15/12 0745 08/14/12 2117 08/14/12 1824 08/14/12 1214 08/14/12 0800 08/13/12 2349  GLUCAP 82 89 93 109* 78 252*   Hemoglobin A1C: No results found for this basename: HGBA1C in the last 72 hours Fasting Lipid Panel: No results found for this basename: CHOL,HDL,LDLCALC,TRIG,CHOLHDL,LDLDIRECT in the last 72 hours Thyroid Function Tests: No results found for this basename: TSH,T4TOTAL,FREET4,T3FREE,THYROIDAB in the last 72 hours Anemia Panel: No results found for this basename: VITAMINB12,FOLATE,FERRITIN,TIBC,IRON,RETICCTPCT in the last 72 hours Coagulation:  Basename 08/15/12 0535 08/14/12 0650  LABPROT 24.8* 21.4*  INR 2.37* 1.94*   Urine Drug Screen: Drugs of Abuse  No results found for this basename: labopia, cocainscrnur, labbenz, amphetmu, thcu, labbarb    Alcohol Level: No results found for this basename: ETH:2 in the last 72 hours Urinalysis:  Basename 08/14/12 0852  COLORURINE YELLOW  LABSPEC 1.017  PHURINE 5.5  GLUCOSEU NEGATIVE  HGBUR TRACE*  BILIRUBINUR NEGATIVE  KETONESUR NEGATIVE  PROTEINUR 100*  UROBILINOGEN 0.2  NITRITE NEGATIVE  LEUKOCYTESUR NEGATIVE    No results found for this or any previous visit (from the past 240 hour(s)).  Studies/Results: Dg Chest 2 View  08/13/2012  *RADIOLOGY REPORT*  Clinical Data: Influenza.  Fever.  Shortness of breath.  Nausea vomiting.  CHEST - 2 VIEW  Comparison: 04/06/2012  Findings: Heart size is within normal limits.  A tiny right pleural effusion is seen with minimal atelectasis or infiltrate in the posterior right lung base.  Left lung remains clear.  No mass or lymphadenopathy identified.  Right jugular dual lumen center venous catheter remains in appropriate position.  IMPRESSION: Tiny right pleural effusion and minimal atelectasis or infiltrate in  the posterior right lung base.   Original Report Authenticated By: Danae Orleans, M.D.     Medications: Scheduled Meds:   . amLODipine  10 mg Oral Daily  . aspirin  81 mg Oral Daily  . calcium acetate  667 mg Oral BID WC  . cloNIDine  0.2 mg Oral BID  . insulin aspart  0-15 Units Subcutaneous TID WC  . insulin aspart  0-5 Units Subcutaneous QHS  . insulin glargine  18 Units Subcutaneous QHS  . isosorbide mononitrate  120 mg Oral Daily  . levothyroxine  25 mcg Oral Q0600  . metoprolol tartrate  25 mg Oral BID  . pantoprazole  40 mg Oral Q1200  . simvastatin  10 mg Oral q1800  . warfarin  2 mg Oral Daily  . Warfarin - Pharmacist Dosing Inpatient   Does not apply q1800  . DISCONTD: vancomycin  750 mg Intravenous Q M,W,F-HD   Continuous Infusions:  PRN Meds:.acetaminophen, acetaminophen, heparin, heparin, HYDROcodone-acetaminophen, nitroGLYCERIN, sodium chloride  Assessment/Plan:  1. GPC bacteremia: Await speciation and sensitivity Source ? Hd Catheter related with seeding during recent AVF placement Continue Vancomycin, await Renal input  2. N/V/Diarrhea due to 1, resolved cdiff PCR pending  3. ESRD on HD MWF per renal  4. H/o PE on coumadin  5. COPD/Chronic resp failure on 2-3L home O2, stable  6. DM: CBGs stable, but <100, will cut down lantus, SSI  7. Anemia of chronic disease; stable, Hb 10.7-11.4, yesterday Hb of 13.8 likely lab error  8. HTN: stable, continue Amlodipine, clonidine, imdur, metoprolol  9. DVT prophylaxis: on coumadin    Time spent coordinating care:   LOS: 2 days   [facility] Pager: [phone] 08/15/2012, 9:16 AM

## 2012-08-15 NOTE — Progress Notes (Signed)
ANTICOAGULATION/ANTIBIOTIC CONSULT NOTE - Initial Consult  Pharmacy Consult for Vancomycin Indication: GPC bacteremia  Allergies  Allergen Reactions  . Procaine Hcl Nausea And Vomiting and Other (See Comments)    "Pass out"    Patient Measurements: Height: 5\' 5"  (165.1 cm) Weight: 133 lb 13.1 oz (60.7 kg) IBW/kg (Calculated) : 57   Vital Signs: Temp: 98.5 F (36.9 C) (10/13 0914) Temp src: Oral (10/13 0914) BP: 137/54 mmHg (10/13 0914) Pulse Rate: 79  (10/13 0914)  Labs:  Basename 08/15/12 0535 08/14/12 0650 08/13/12 2202 08/13/12 2014  HGB 10.7* 13.8 -- --  HCT 33.2* 41.4 -- 34.2*  PLT 216 235 -- 234  APTT -- -- -- --  LABPROT 24.8* 21.4* 24.9* --  INR 2.37* 1.94* 2.38* --  HEPARINUNFRC -- -- -- --  CREATININE -- 4.60* -- 4.51*  CKTOTAL -- -- -- --  CKMB -- -- -- --  TROPONINI -- -- -- --    Estimated Creatinine Clearance: 10.4 ml/min (by C-G formula based on Cr of 4.6).   Medical History: Past Medical History  Diagnosis Date  . Diabetes mellitus   . Hypercholesterolemia   . Hypothyroidism   . Colitis   . Coronary artery disease   . Hypertension   . GERD (gastroesophageal reflux disease)   . Myocardial infarction   . Peripheral vascular disease   . Pneumonia   . Angina   . H/O Clostridium difficile infection   . Hx of pulmonary embolus   . COPD (chronic obstructive pulmonary disease)   . Shortness of breath     uses O2 as needed  . Chronic kidney disease     M-W-F   [place]    Medications:  Prescriptions prior to admission  Medication Sig Dispense Refill  . amLODipine (NORVASC) 10 MG tablet Take 10 mg by mouth daily.       Marland Kitchen aspirin 81 MG chewable tablet Chew 81 mg by mouth daily.      . calcium acetate (PHOSLO) 667 MG capsule Take 667 mg by mouth 2 (two) times daily.       . cloNIDine (CATAPRES) 0.2 MG tablet Take 0.2 mg by mouth 2 (two) times daily.       . insulin glargine (LANTUS) 100 UNIT/ML injection Inject 18 Units into the skin at  bedtime.      . isosorbide mononitrate (IMDUR) 120 MG 24 hr tablet Take 120 mg by mouth daily.       Marland Kitchen levothyroxine (SYNTHROID, LEVOTHROID) 25 MCG tablet Take 25 mcg by mouth daily.       . metoprolol tartrate (LOPRESSOR) 25 MG tablet Take 25 mg by mouth 2 (two) times daily.      . nitroGLYCERIN (NITROSTAT) 0.4 MG SL tablet Place 0.4 mg under the tongue every 5 (five) minutes as needed. For chest pain.      . pantoprazole (PROTONIX) 40 MG tablet Take 40 mg by mouth daily.       . Probiotic Product (PROBIOTIC PO) Take 1 capsule by mouth daily.      . simvastatin (ZOCOR) 10 MG tablet Take 10 mg by mouth daily at 6 PM.       . warfarin (COUMADIN) 2 MG tablet Take 2 mg by mouth daily.       . Insulin Syringe-Needle U-100 (INSULIN SYRINGE .5CC/28G) 28G X 1/2" 0.5 ML MISC daily.       Scheduled:     . amLODipine  10 mg Oral Daily  . aspirin  81  mg Oral Daily  . calcium acetate  667 mg Oral BID WC  . cloNIDine  0.2 mg Oral BID  . insulin aspart  0-15 Units Subcutaneous TID WC  . insulin aspart  0-5 Units Subcutaneous QHS  . insulin glargine  12 Units Subcutaneous QHS  . isosorbide mononitrate  120 mg Oral Daily  . levothyroxine  25 mcg Oral Q0600  . metoprolol tartrate  25 mg Oral BID  . pantoprazole  40 mg Oral Q1200  . simvastatin  10 mg Oral q1800  . warfarin  2 mg Oral Daily  . Warfarin - Pharmacist Dosing Inpatient   Does not apply q1800  . DISCONTD: insulin glargine  18 Units Subcutaneous QHS  . DISCONTD: vancomycin  750 mg Intravenous Q M,W,F-HD    Assessment: 71 yo female with ESRD on HD MWF to restart vancomycin for positive blood cultures drawn on 08/13/12 for gram positive cocci. Plans for HD tomorrow noted. Pt received vanc 1g in ED on 10/11 at 2247; additional vancomycin 500 mg given 10/12 at 0141 to complete load. Pt had 3.5 h HD session yesterday for missed HD on Friday, no vancomycin given after HD yesterday, will give a dose today (500mg  due to slightly higher load). C  diff PCR also pending. Pt is currently afebrile with Tmax of 100.6 yesterday, WBC 16.7>> 9.8 today wnl.    Goal of Therapy:  INR 2-3 Pre-HD vanc level 15-25   Plan:  Vancomycin 500 mg IV x1 today Vancomycin 750 mg IV after each HD MWF, next dose tomorrow F/u speciation/sensitivity of blood cultures, fever curve, HD plans, weight changes, vancomycin trough at steady state  Shaquanta Harkless L  08/15/2012,9:34 AM

## 2012-08-15 NOTE — Progress Notes (Signed)
Subjective:  No further nausea, vomiting, or diarrhea; tolerating food; but has had left hand pain, especially after dialysis since 2nd stage BVT on 9/24; very painful since yesterday's treatment.  Objective: Vital signs in last 24 hours: Temp:  [98.6 F (37 C)-100.6 F (38.1 C)] 99.9 F (37.7 C) (10/13 0537) Pulse Rate:  [84-103] 84  (10/13 0537) Resp:  [16-18] 17  (10/13 0537) BP: (121-184)/(43-75) 123/43 mmHg (10/13 0537) SpO2:  [92 %-96 %] 94 % (10/13 0537) Weight:  [60.7 kg (133 lb 13.1 oz)-62.5 kg (137 lb 12.6 oz)] 60.7 kg (133 lb 13.1 oz) (10/12 2214) Weight change: 0.811 kg (1 lb 12.6 oz)  Intake/Output from previous day: 10/12 0701 - 10/13 0700 In: 400 [P.O.:400] Out: 1997 [Urine:600]   EXAM: General appearance:  Alert, in no apparent distress Resp:  CTA without rales, rhonchi, or wheezes Cardio:  RRR without murmur or rub GI: + BS, soft and nontender Extremities: No edema Access:  Right IJ catheter, also AVF @ LUA with + bruit  Lab Results:  Basename 08/15/12 0535 08/14/12 0650  WBC 9.8 16.7*  HGB 10.7* 13.8  HCT 33.2* 41.4  PLT 216 235   BMET:  Basename 08/14/12 0650 08/13/12 2014  NA 139 136  K 4.2 3.8  CL 97 97  CO2 22 23  GLUCOSE 60* 200*  BUN 57* 53*  CREATININE 4.60* 4.51*  CALCIUM 9.6 9.2  ALBUMIN -- 3.2*   No results found for this basename: PTH:2 in the last 72 hours Iron Studies: No results found for this basename: IRON,TIBC,TRANSFERRIN,FERRITIN in the last 72 hours  Dialysis Orders: Center: [facility] on MWF .  EDW 60.5 kg HD Bath 2K/2.5Ca Time 4 hrs Heparin 1200 U. Access Right IJ catheter, AVF @ LUA BFR 400 DFR A1.5  Zemplar 0 mcg IV/HD Epogen 0 Units IV/HD Venofer 0   Assessment/Plan: 1. Nausea/vomiting/diarrhea - probably due to bacteremia. Resolved. Blood cultures with Gr + cocci (? R IJ catheter as source). Getting IV Vancomycin, GI symptoms better.  Remove catheter post-HD tomorrow. 2. ESRD - HD on MWF @ [facility]; K 4.2. HD  tomorrow. 3. Hypertension/volume - BP 123/43, on Clonidine 0.2 mg bid, Metoprolol 25 mg bid, and Amlodipine 10 mg qd; current wt 60.7 kg s/p net UF 1.4 L yesterday with EDW 60.5 kg.  4. Anemia - Hgb 10.7, no outpatient Epogen or Fe.  5. Metabolic bone disease - Ca 9.6 (11 corrected), P 3, no Zemplar, on Phoslo 1 with meals. 6. Nutrition - Alb 3.2. 7. New access - s/p left 1st stage BVT 7/16, 2nd stage BVT 9/24 per Dr. Imogene Burn; pain and numbness in left hand since 2nd stage: required ligation of right BVT on 04/06/12 secondary to steal syndrome.  Needs follow-up with VVS. 8. 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 9. Hypothyroidism - on Synthroid.   LOS: 2 days   LYLES,CHARLES 08/15/2012,8:29 AM  Patient seen and examined and agree with assessment and plan as above.  [name]  MD [employer] [phone] pgr    [phone] cell 08/15/2012, 2:57 PM

## 2012-08-16 ENCOUNTER — Inpatient Hospital Stay (HOSPITAL_COMMUNITY): Payer: Medicare Other

## 2012-08-16 DIAGNOSIS — R7881 Bacteremia: Secondary | ICD-10-CM | POA: Diagnosis present

## 2012-08-16 LAB — RENAL FUNCTION PANEL
Albumin: 2.9 g/dL — ABNORMAL LOW (ref 3.5–5.2)
BUN: 36 mg/dL — ABNORMAL HIGH (ref 6–23)
Calcium: 8.3 mg/dL — ABNORMAL LOW (ref 8.4–10.5)
Phosphorus: 4.1 mg/dL (ref 2.3–4.6)
Potassium: 3.2 mEq/L — ABNORMAL LOW (ref 3.5–5.1)
Sodium: 134 mEq/L — ABNORMAL LOW (ref 135–145)

## 2012-08-16 LAB — CBC
MCH: 28.5 pg (ref 26.0–34.0)
MCV: 87.8 fL (ref 78.0–100.0)
Platelets: 212 10*3/uL (ref 150–400)
RDW: 14.4 % (ref 11.5–15.5)

## 2012-08-16 LAB — CULTURE, BLOOD (ROUTINE X 2)

## 2012-08-16 LAB — GLUCOSE, CAPILLARY: Glucose-Capillary: 130 mg/dL — ABNORMAL HIGH (ref 70–99)

## 2012-08-16 MED ORDER — PNEUMOCOCCAL VAC POLYVALENT 25 MCG/0.5ML IJ INJ
0.5000 mL | INJECTION | INTRAMUSCULAR | Status: AC
  Administered 2012-08-17: 0.5 mL via INTRAMUSCULAR
  Filled 2012-08-16: qty 0.5

## 2012-08-16 MED ORDER — WARFARIN - PHARMACIST DOSING INPATIENT: Freq: Every day | Status: DC

## 2012-08-16 MED ORDER — INFLUENZA VIRUS VACC SPLIT PF IM SUSP
0.5000 mL | INTRAMUSCULAR | Status: AC
  Administered 2012-08-17: 0.5 mL via INTRAMUSCULAR
  Filled 2012-08-16: qty 0.5

## 2012-08-16 NOTE — Progress Notes (Addendum)
[facility]  Subjective    Patient is a 71 year old female who is status post first and second stage basilic vein transposition by Dr. Imogene Burn. Her last procedure was in September 24. She currently complains of numbness tingling and coolness in her left hand when on dialysis. She does not really have any numbness and tingling in her hand when she is not on dialysis. She denies any nonhealing wounds on her digits. She does not have weakness in the hand. She states the numbness and tingling was not present until after her second stage procedure.  She complains of her left hand turning "blue and colder during dialysis using the right IJ catheter.  Right-sided dialysis catheter is currently being used for dialysis.  She is in the hospital secondary to infection.  We are being asked to take out her diatek catheter and send it for cultures as a  possible source of infection.  The patient is on coumadin and her INR is 2.37 today.   Objective 171/66 85 98.4 F (36.9 C) (Oral) 20 94%  Intake/Output Summary (Last 24 hours) at 08/16/12 1300 Last data filed at 08/16/12 1008  Gross per 24 hour  Intake    120 ml  Output   1100 ml  Net   -980 ml   Left AV fistula has a good palpable thrill. Sensation in the left hand is globally decreased compared to the right hand.   Motor is equal bilaterally.   No change from last office visit in exam a few weeks ago.     Assessment/Planning: Mild to moderate steal left upper extremity status post basilic vein transposition. I discussed with the patient that if her symptoms are currently tolerable I would advise observation. If she begins to develop continuous numbness and tingling that is more bothersome to her or motor deficits or nonhealing ulcers we would have to consider ligating the fistula.    I will order 5 mg of Vit K and check her INR in the AM we will hold her coumadin tonight.  The INR has to be less than 1.7 before we  can remove the catheter.  Thomasena Edis, EMMA Nash Shearer, EMMA MAUREEN 08/16/2012 1:00 PM --  Laboratory Lab Results:  Basename 08/16/12 0719 08/15/12 0535  WBC 7.2 9.8  HGB 10.3* 10.7*  HCT 31.8* 33.2*  PLT 212 216   BMET  Basename 08/16/12 0719 08/14/12 0650  NA 134* 139  K 3.2* 4.2  CL 96 97  CO2 27 22  GLUCOSE 197* 60*  BUN 36* 57*  CREATININE 5.01* 4.60*  CALCIUM 8.3* 9.6    COAG Lab Results  Component Value Date   INR 2.37* 08/15/2012   INR 1.94* 08/14/2012   INR 2.38* 08/13/2012   No results found for this basename: PTT    Antibiotics Anti-infectives     Start     Dose/Rate Route Frequency Ordered Stop   08/16/12 1200   vancomycin (VANCOCIN) 750 mg in sodium chloride 0.9 % 150 mL IVPB  Status:  Discontinued        750 mg 150 mL/hr over 60 Minutes Intravenous Every M-W-F (Hemodialysis) 08/14/12 0011 08/14/12 1401   08/16/12 1200   vancomycin (VANCOCIN) 750 mg in sodium chloride 0.9 % 150 mL IVPB  Status:  Discontinued        750 mg 150 mL/hr over 60 Minutes Intravenous Every M-W-F (Hemodialysis) 08/15/12 0941 08/15/12 1314   08/16/12 1200   vancomycin (VANCOCIN) 500 mg  in sodium chloride 0.9 % 100 mL IVPB        500 mg 100 mL/hr over 60 Minutes Intravenous Every M-W-F (Hemodialysis) 08/15/12 1314     08/15/12 1030   vancomycin (VANCOCIN) 500 mg in sodium chloride 0.9 % 100 mL IVPB        500 mg 100 mL/hr over 60 Minutes Intravenous  Once 08/15/12 0953 08/15/12 1125   08/14/12 0015   vancomycin (VANCOCIN) 500 mg in sodium chloride 0.9 % 100 mL IVPB        500 mg 100 mL/hr over 60 Minutes Intravenous  Once 08/14/12 0011 08/14/12 0241   08/13/12 2030   vancomycin (VANCOCIN) IVPB 1000 mg/200 mL premix        1,000 mg 200 mL/hr over 60 Minutes Intravenous  Once 08/13/12 2015 08/13/12 2347   08/13/12 2030  piperacillin-tazobactam (ZOSYN) IVPB 3.375 g       3.375 g 12.5 mL/hr over 240 Minutes Intravenous  Once 08/13/12 2015 08/14/12 0741          Lab Results  Component Value Date   INR 2.63* 08/17/2012   INR 2.64* 08/16/2012   INR 2.37* 08/15/2012     Addendum  I have independently interviewed and examined the patient, and I agree with the physician assistant's findings.   Pt c/o intermittent mild pain and paraesthesia in left hand.  On exam, cool bilateral hands without cyanosis.  Hand grip 5/5 on left.  Good bruit, incision healing well.    - Left arm arterial studies ordered to to try to quantify amount of steal - Unfortunately, I have a suspicion that some of the steal is due to inflow issues, i.e. Atherosclerosis in upper extremities vs primary cardiac output insufficiency.  Her recent stress testing suggests some degree of this.  Also her history of steal sx in left hand on dialysis via [facility] strongly suggest a inflow problem to left arm with cardiac dysfunction. - L arm angiogram may be needed as unfortunately upper extremity atherosclerosis is a marker for lower extremity atherosclerosis. - Anticoagulation needs reversal prior to [facility] removal.  Leonides Sake, MD [facility]: [phone] Pager: [phone]  08/17/2012, 7:56 AM

## 2012-08-16 NOTE — Progress Notes (Signed)
ANTICOAGULATION CONSULT NOTE - Follow Up Consult  Pharmacy Consult:  Coumadin Indication: Hx PE  Allergies  Allergen Reactions  . Procaine Hcl Nausea And Vomiting and Other (See Comments)    "Pass out"    Patient Measurements: Height: 5\' 5"  (165.1 cm) Weight: 133 lb 13.1 oz (60.7 kg) IBW/kg (Calculated) : 57   Vital Signs: Temp: 98.4 F (36.9 C) (10/14 0530) Temp src: Oral (10/14 0530) BP: 152/64 mmHg (10/14 0930) Pulse Rate: 81  (10/14 0930)  Labs:  Basename 08/16/12 0719 08/15/12 0535 08/14/12 0650 08/13/12 2202 08/13/12 2014  HGB 10.3* 10.7* -- -- --  HCT 31.8* 33.2* 41.4 -- --  PLT 212 216 235 -- --  APTT -- -- -- -- --  LABPROT -- 24.8* 21.4* 24.9* --  INR -- 2.37* 1.94* 2.38* --  HEPARINUNFRC -- -- -- -- --  CREATININE 5.01* -- 4.60* -- 4.51*  CKTOTAL -- -- -- -- --  CKMB -- -- -- -- --  TROPONINI -- -- -- -- --    Estimated Creatinine Clearance: 9.5 ml/min (by C-G formula based on Cr of 5.01).      Assessment: 71 YOF continuing on Coumadin for history of PE. INR remains therapeutic; no bleeding documented.  Noted plan for IJ catheter removal and Coumadin to be on hold.  Patient also on vancomycin for CoNS bacteremia and continues on MWF dialysis.  Vanc 10/11 >> 10/12, restarted 10/13>>  10/11 CDiff PCR >> neg 10/11 BCx >> 2/2 CNS (sensitive to gent, rifampin, vanc)   Goal of Therapy:  INR 2-3    Plan:  - Hold Coumadin today per PA  - Vanc 500mg  IV qHD on MWF - F/U repeat blood cx - Daily PT / INR     Audy Dauphine D. Laney Potash, PharmD, BCPS Pager:  [phone] 08/16/2012, 1:13 PM

## 2012-08-16 NOTE — Progress Notes (Signed)
[facility]             Progress Note   Subjective: Doing better, no further vomiting, diarrhea improved, c/o numbness /pain in LUE  Objective: Vital signs in last 24 hours: Temp:  [98.4 F (36.9 C)-99.1 F (37.3 C)] 98.4 F (36.9 C) (10/14 0530) Pulse Rate:  [69-81] 81  (10/14 0930) Resp:  [16-20] 20  (10/14 0930) BP: (119-152)/(47-68) 152/64 mmHg (10/14 0930) SpO2:  [93 %-95 %] 94 % (10/14 0530) Weight:  [60.3 kg (132 lb 15 oz)-60.7 kg (133 lb 13.1 oz)] 60.7 kg (133 lb 13.1 oz) (10/14 0530) Weight change: -2.2 kg (-4 lb 13.6 oz) Last BM Date: 08/15/12  Intake/Output from previous day: 10/13 0701 - 10/14 0700 In: 480 [P.O.:480] Out: -      Physical Exam: General: Alert, awake, oriented x3, in no acute distress. HEENT: No bruits, no goiter. R IJ HD catheter Heart: Regular rate and rhythm, without murmurs, rubs, gallops. Lungs: Clear to auscultation bilaterally. Abdomen: Soft, nontender, nondistended, positive bowel sounds. Extremities: No clubbing cyanosis or edema with positive pedal pulses, LUE AVF Neuro: Grossly intact, nonfocal.    Lab Results: Basic Metabolic Panel:  Basename 08/16/12 0719 08/14/12 0650  NA 134* 139  K 3.2* 4.2  CL 96 97  CO2 27 22  GLUCOSE 197* 60*  BUN 36* 57*  CREATININE 5.01* 4.60*  CALCIUM 8.3* 9.6  MG -- --  PHOS 4.1 --   Liver Function Tests:  Basename 08/16/12 0719 08/13/12 2014  AST -- 14  ALT -- 9  ALKPHOS -- 103  BILITOT -- 0.2*  PROT -- 6.9  ALBUMIN 2.9* 3.2*   No results found for this basename: LIPASE:2,AMYLASE:2 in the last 72 hours No results found for this basename: AMMONIA:2 in the last 72 hours CBC:  Basename 08/16/12 0719 08/15/12 0535 08/13/12 2014  WBC 7.2 9.8 --  NEUTROABS -- -- 12.5*  HGB 10.3* 10.7* --  HCT 31.8* 33.2* --  MCV 87.8 88.3 --  PLT 212 216 --   Cardiac Enzymes: No results found for this basename: CKTOTAL:3,CKMB:3,CKMBINDEX:3,TROPONINI:3 in the last 72 hours BNP: No  results found for this basename: PROBNP:3 in the last 72 hours D-Dimer: No results found for this basename: DDIMER:2 in the last 72 hours CBG:  Basename 08/15/12 2136 08/15/12 1648 08/15/12 1204 08/15/12 0745 08/14/12 2117 08/14/12 1824  GLUCAP 248* 163* 159* 82 89 93   Hemoglobin A1C: No results found for this basename: HGBA1C in the last 72 hours Fasting Lipid Panel: No results found for this basename: CHOL,HDL,LDLCALC,TRIG,CHOLHDL,LDLDIRECT in the last 72 hours Thyroid Function Tests: No results found for this basename: TSH,T4TOTAL,FREET4,T3FREE,THYROIDAB in the last 72 hours Anemia Panel: No results found for this basename: VITAMINB12,FOLATE,FERRITIN,TIBC,IRON,RETICCTPCT in the last 72 hours Coagulation:  Basename 08/15/12 0535 08/14/12 0650  LABPROT 24.8* 21.4*  INR 2.37* 1.94*   Urine Drug Screen: Drugs of Abuse  No results found for this basename: labopia,  cocainscrnur,  labbenz,  amphetmu,  thcu,  labbarb    Alcohol Level: No results found for this basename: ETH:2 in the last 72 hours Urinalysis:  Basename 08/14/12 0852  COLORURINE YELLOW  LABSPEC 1.017  PHURINE 5.5  GLUCOSEU NEGATIVE  HGBUR TRACE*  BILIRUBINUR NEGATIVE  KETONESUR NEGATIVE  PROTEINUR 100*  UROBILINOGEN 0.2  NITRITE NEGATIVE  LEUKOCYTESUR NEGATIVE    Recent Results (from the past 240 hour(s))  CULTURE, BLOOD (ROUTINE X 2)     Status: Normal   Collection Time   08/13/12  8:30 PM      Component Value Range Status Comment   Specimen Description BLOOD ARM RIGHT   Final    Special Requests BOTTLES DRAWN AEROBIC AND ANAEROBIC 10CC   Final    Culture  Setup Time 08/14/2012 01:08   Final    Culture     Final    Value: STAPHYLOCOCCUS SPECIES (COAGULASE NEGATIVE)     Note: SUSCEPTIBILITIES PERFORMED ON PREVIOUS CULTURE WITHIN THE LAST 5 DAYS.     Note: Gram Stain Report Called to,Read Back By and Verified With: Minna Merritts RN on 08/15/12 at 01:25 by Christie Nottingham   Report Status 08/16/2012 FINAL    Final   CULTURE, BLOOD (ROUTINE X 2)     Status: Normal   Collection Time   08/13/12  8:40 PM      Component Value Range Status Comment   Specimen Description BLOOD RIGHT HAND   Final    Special Requests BOTTLES DRAWN AEROBIC AND ANAEROBIC 10CC   Final    Culture  Setup Time 08/14/2012 01:08   Final    Culture     Final    Value: STAPHYLOCOCCUS SPECIES (COAGULASE NEGATIVE)     Note: RIFAMPIN AND GENTAMICIN SHOULD NOT BE USED AS SINGLE DRUGS FOR TREATMENT OF STAPH INFECTIONS.     Note: Gram Stain Report Called to,Read Back By and Verified With: KEJI ODEDERE 08/14/12 @ 8:45PM BY RUSCA.   Report Status 08/16/2012 FINAL   Final    Organism ID, Bacteria STAPHYLOCOCCUS SPECIES (COAGULASE NEGATIVE)   Final   CLOSTRIDIUM DIFFICILE BY PCR     Status: Normal   Collection Time   08/14/12 10:03 PM      Component Value Range Status Comment   C difficile by pcr NEGATIVE  NEGATIVE Final     Studies/Results: No results found.  Medications: Scheduled Meds:    . amLODipine  10 mg Oral Daily  . aspirin  81 mg Oral Daily  . calcium acetate  667 mg Oral BID WC  . cloNIDine  0.2 mg Oral BID  . insulin aspart  0-15 Units Subcutaneous TID WC  . insulin aspart  0-5 Units Subcutaneous QHS  . insulin glargine  12 Units Subcutaneous QHS  . isosorbide mononitrate  120 mg Oral Daily  . levothyroxine  25 mcg Oral Q0600  . metoprolol tartrate  25 mg Oral BID  . pantoprazole  40 mg Oral Q1200  . simvastatin  10 mg Oral q1800  . vancomycin  500 mg Intravenous Once  . vancomycin  500 mg Intravenous Q M,W,F-HD  . warfarin  2 mg Oral Daily  . Warfarin - Pharmacist Dosing Inpatient   Does not apply q1800  . DISCONTD: vancomycin  750 mg Intravenous Q M,W,F-HD   Continuous Infusions:  PRN Meds:.acetaminophen, acetaminophen, heparin, heparin, HYDROcodone-acetaminophen, nitroGLYCERIN, sodium chloride  Assessment/Plan:  1. Coagulase negative staph bacteremia Sensitive to Vanc Source ? Hd Catheter related  with seeding during recent AVF placement Continue Vancomycin, for HD catheter removal per renal Will repeat blood cultures to ensure clearing  2. N/V/Diarrhea due to 1, resolved cdiff PCR negative  3. ESRD on HD MWF per renal  4. H/o PE on coumadin  5. COPD/Chronic resp failure on 2-3L home O2, stable  6. DM: CBGs stable, improved, continue lantus, SSI  7. Anemia of chronic disease; stable, Hb 10.7-11.4, yesterday Hb of 13.8 likely lab error  8. HTN: stable, continue Amlodipine, clonidine, imdur, metoprolol  9. DVT prophylaxis: on coumadin  Time spent coordinating care:   LOS: 3 days   [facility] Pager: [phone] 08/16/2012, 9:34 AM

## 2012-08-16 NOTE — Procedures (Signed)
I have seen and examined this patient and agree with the plan of care, seen on dialysis. Will need perm cath removed and VVS to evaluate for new perm cath. Her AVF is not mature  Zenith Lamphier W 08/16/2012, 6:42 AM

## 2012-08-17 DIAGNOSIS — R0989 Other specified symptoms and signs involving the circulatory and respiratory systems: Secondary | ICD-10-CM

## 2012-08-17 DIAGNOSIS — D649 Anemia, unspecified: Secondary | ICD-10-CM

## 2012-08-17 DIAGNOSIS — E119 Type 2 diabetes mellitus without complications: Secondary | ICD-10-CM

## 2012-08-17 LAB — GLUCOSE, CAPILLARY
Glucose-Capillary: 184 mg/dL — ABNORMAL HIGH (ref 70–99)
Glucose-Capillary: 298 mg/dL — ABNORMAL HIGH (ref 70–99)
Glucose-Capillary: 202 mg/dL — ABNORMAL HIGH (ref 70–99)
Glucose-Capillary: 269 mg/dL — ABNORMAL HIGH (ref 70–99)

## 2012-08-17 LAB — CBC
HCT: 34.3 % — ABNORMAL LOW (ref 36.0–46.0)
MCH: 28.8 pg (ref 26.0–34.0)
MCHC: 32.7 g/dL (ref 30.0–36.0)
MCV: 88.2 fL (ref 78.0–100.0)
RDW: 14.4 % (ref 11.5–15.5)

## 2012-08-17 LAB — PROTIME-INR: INR: 2.63 — ABNORMAL HIGH (ref 0.00–1.49)

## 2012-08-17 MED ORDER — PHYTONADIONE 5 MG PO TABS
5.0000 mg | ORAL_TABLET | Freq: Once | ORAL | Status: AC
  Administered 2012-08-17: 5 mg via ORAL
  Filled 2012-08-17: qty 1

## 2012-08-17 MED ORDER — HEPARIN SODIUM (PORCINE) 1000 UNIT/ML DIALYSIS
20.0000 [IU]/kg | INTRAMUSCULAR | Status: DC | PRN
  Filled 2012-08-17: qty 2

## 2012-08-17 MED ORDER — VITAMIN K1 10 MG/ML IJ SOLN
5.0000 mg | Freq: Once | INTRAVENOUS | Status: DC
  Filled 2012-08-17: qty 0.5

## 2012-08-17 NOTE — Progress Notes (Signed)
[facility] [place]   Hold coumadin and give 5mg  vit K will re-check INR tomorrow.  Plan removal of diatek when INR is less than 1.7 per request.  Thomasena Edis, Nahshon Reich MAUREEN PA-C

## 2012-08-17 NOTE — Progress Notes (Addendum)
[facility]             Progress Note  Brief summary: Ms.Lundquist is a 71/F with DM, COPD, PE on coumadin, ESRD on HD via HD catheter and recent AVF, presented to [facility] with N/V/D and fever subsequently found to have Coag negative staph bacteremia, stable on Vanc, plan for HD cath removal     Subjective: Doing better, no further vomiting, diarrhea improved, c/o numbness /pain in LUE  Objective: Vital signs in last 24 hours: Temp:  [98 F (36.7 C)-98.4 F (36.9 C)] 98 F (36.7 C) (10/15 0833) Pulse Rate:  [56-85] 69  (10/15 0833) Resp:  [18-20] 18  (10/15 0833) BP: (101-171)/(54-70) 143/65 mmHg (10/15 0833) SpO2:  [93 %-96 %] 96 % (10/15 0833) Weight:  [59.6 kg (131 lb 6.3 oz)-60.3 kg (132 lb 15 oz)] 60.3 kg (132 lb 15 oz) (10/14 2120) Weight change: -0.7 kg (-1 lb 8.7 oz) Last BM Date: 08/15/12  Intake/Output from previous day: 10/14 0701 - 10/15 0700 In: 120 [P.O.:120] Out: 1100  Total I/O In: 240 [P.O.:240] Out: -    Physical Exam: General: Alert, awake, oriented x3, in no acute distress. HEENT: No bruits, no goiter. R IJ HD catheter Heart: Regular rate and rhythm, without murmurs, rubs, gallops. Lungs: Clear to auscultation bilaterally. Abdomen: Soft, nontender, nondistended, positive bowel sounds. Extremities: No clubbing cyanosis or edema with positive pedal pulses, LUE AVF Neuro: Grossly intact, nonfocal.    Lab Results: Basic Metabolic Panel:  Basename 08/16/12 0719  NA 134*  K 3.2*  CL 96  CO2 27  GLUCOSE 197*  BUN 36*  CREATININE 5.01*  CALCIUM 8.3*  MG --  PHOS 4.1   Liver Function Tests:  Basename 08/16/12 0719  AST --  ALT --  ALKPHOS --  BILITOT --  PROT --  ALBUMIN 2.9*   No results found for this basename: LIPASE:2,AMYLASE:2 in the last 72 hours No results found for this basename: AMMONIA:2 in the last 72 hours CBC:  Basename 08/17/12 0520 08/16/12 0719  WBC 7.6 7.2  NEUTROABS -- --  HGB 11.2* 10.3*  HCT 34.3*  31.8*  MCV 88.2 87.8  PLT 216 212   Cardiac Enzymes: No results found for this basename: CKTOTAL:3,CKMB:3,CKMBINDEX:3,TROPONINI:3 in the last 72 hours BNP: No results found for this basename: PROBNP:3 in the last 72 hours D-Dimer: No results found for this basename: DDIMER:2 in the last 72 hours CBG:  Basename 08/17/12 0740 08/16/12 2119 08/16/12 1657 08/16/12 1138 08/16/12 1041 08/15/12 2136  GLUCAP 184* 191* 231* 130* 102* 248*   Hemoglobin A1C: No results found for this basename: HGBA1C in the last 72 hours Fasting Lipid Panel: No results found for this basename: CHOL,HDL,LDLCALC,TRIG,CHOLHDL,LDLDIRECT in the last 72 hours Thyroid Function Tests: No results found for this basename: TSH,T4TOTAL,FREET4,T3FREE,THYROIDAB in the last 72 hours Anemia Panel: No results found for this basename: VITAMINB12,FOLATE,FERRITIN,TIBC,IRON,RETICCTPCT in the last 72 hours Coagulation:  Basename 08/17/12 0520 08/16/12 1227  LABPROT 26.8* 26.9*  INR 2.63* 2.64*   Urine Drug Screen: Drugs of Abuse  No results found for this basename: labopia,  cocainscrnur,  labbenz,  amphetmu,  thcu,  labbarb    Alcohol Level: No results found for this basename: ETH:2 in the last 72 hours Urinalysis: No results found for this basename: COLORURINE:2,APPERANCEUR:2,LABSPEC:2,PHURINE:2,GLUCOSEU:2,HGBUR:2,BILIRUBINUR:2,KETONESUR:2,PROTEINUR:2,UROBILINOGEN:2,NITRITE:2,LEUKOCYTESUR:2 in the last 72 hours  Recent Results (from the past 240 hour(s))  CULTURE, BLOOD (ROUTINE X 2)     Status: Normal   Collection Time   08/13/12  8:30 PM  Component Value Range Status Comment   Specimen Description BLOOD ARM RIGHT   Final    Special Requests BOTTLES DRAWN AEROBIC AND ANAEROBIC 10CC   Final    Culture  Setup Time 08/14/2012 01:08   Final    Culture     Final    Value: STAPHYLOCOCCUS SPECIES (COAGULASE NEGATIVE)     Note: SUSCEPTIBILITIES PERFORMED ON PREVIOUS CULTURE WITHIN THE LAST 5 DAYS.     Note: Gram Stain  Report Called to,Read Back By and Verified With: Minna Merritts RN on 08/15/12 at 01:25 by Christie Nottingham   Report Status 08/16/2012 FINAL   Final   CULTURE, BLOOD (ROUTINE X 2)     Status: Normal   Collection Time   08/13/12  8:40 PM      Component Value Range Status Comment   Specimen Description BLOOD RIGHT HAND   Final    Special Requests BOTTLES DRAWN AEROBIC AND ANAEROBIC 10CC   Final    Culture  Setup Time 08/14/2012 01:08   Final    Culture     Final    Value: STAPHYLOCOCCUS SPECIES (COAGULASE NEGATIVE)     Note: RIFAMPIN AND GENTAMICIN SHOULD NOT BE USED AS SINGLE DRUGS FOR TREATMENT OF STAPH INFECTIONS.     Note: Gram Stain Report Called to,Read Back By and Verified With: KEJI ODEDERE 08/14/12 @ 8:45PM BY RUSCA.   Report Status 08/16/2012 FINAL   Final    Organism ID, Bacteria STAPHYLOCOCCUS SPECIES (COAGULASE NEGATIVE)   Final   CLOSTRIDIUM DIFFICILE BY PCR     Status: Normal   Collection Time   08/14/12 10:03 PM      Component Value Range Status Comment   C difficile by pcr NEGATIVE  NEGATIVE Final   CULTURE, BLOOD (ROUTINE X 2)     Status: Normal (Preliminary result)   Collection Time   08/16/12 12:20 PM      Component Value Range Status Comment   Specimen Description BLOOD RIGHT ARM   Final    Special Requests BOTTLES DRAWN AEROBIC ONLY 7CC   Final    Culture  Setup Time 08/16/2012 18:29   Final    Culture     Final    Value:        BLOOD CULTURE RECEIVED NO GROWTH TO DATE CULTURE WILL BE HELD FOR 5 DAYS BEFORE ISSUING A FINAL NEGATIVE REPORT   Report Status PENDING   Incomplete   CULTURE, BLOOD (ROUTINE X 2)     Status: Normal (Preliminary result)   Collection Time   08/16/12 12:30 PM      Component Value Range Status Comment   Specimen Description BLOOD RIGHT HAND   Final    Special Requests BOTTLES DRAWN AEROBIC ONLY 3CC   Final    Culture  Setup Time 08/16/2012 18:29   Final    Culture     Final    Value:        BLOOD CULTURE RECEIVED NO GROWTH TO DATE CULTURE WILL  BE HELD FOR 5 DAYS BEFORE ISSUING A FINAL NEGATIVE REPORT   Report Status PENDING   Incomplete   MRSA PCR SCREENING     Status: Normal   Collection Time   08/16/12  2:29 PM      Component Value Range Status Comment   MRSA by PCR NEGATIVE  NEGATIVE Final     Studies/Results: No results found.  Medications: Scheduled Meds:    . amLODipine  10 mg Oral Daily  . aspirin  81 mg Oral Daily  . calcium acetate  667 mg Oral BID WC  . cloNIDine  0.2 mg Oral BID  . influenza  inactive virus vaccine  0.5 mL Intramuscular Tomorrow-1000  . insulin aspart  0-15 Units Subcutaneous TID WC  . insulin aspart  0-5 Units Subcutaneous QHS  . insulin glargine  12 Units Subcutaneous QHS  . isosorbide mononitrate  120 mg Oral Daily  . levothyroxine  25 mcg Oral Q0600  . metoprolol tartrate  25 mg Oral BID  . pantoprazole  40 mg Oral Q1200  . phytonadione (VITAMIN K) IV  5 mg Intravenous Once  . phytonadione  5 mg Oral Once  . pneumococcal 23 valent vaccine  0.5 mL Intramuscular Tomorrow-1000  . simvastatin  10 mg Oral q1800  . vancomycin  500 mg Intravenous Q M,W,F-HD  . DISCONTD: warfarin  2 mg Oral Daily  . DISCONTD: Warfarin - Pharmacist Dosing Inpatient   Does not apply q1800  . DISCONTD: Warfarin - Pharmacist Dosing Inpatient   Does not apply q1800   Continuous Infusions:  PRN Meds:.acetaminophen, acetaminophen, heparin, heparin, heparin, HYDROcodone-acetaminophen, nitroGLYCERIN, sodium chloride  Assessment/Plan:  1. Coagulase negative staph bacteremia 2/2 from 10/11 Sensitive to Vanc Source suspected to be HD Catheter related with seeding during recent AVF placement Continue Vancomycin, for HD catheter removal per [facility]VS once INR <1.7 Hold coumadin, vitamin K, if INR does not trend down much will need FFP repeat blood cultures 10/14 pending  2. N/V/Diarrhea due to 1, resolved cdiff PCR negative  3. ESRD on HD MWF per renal  4. H/o PE on coumadin, hold coumadin, Vitamin K today, if INR  drops significantly <1.7 start IV heparin in the interim  5. COPD/Chronic resp failure on 2-3L home O2, stable  6. DM: CBGs stable, improved, continue lantus, SSI  7. Anemia of chronic disease; stable, Hb 10.7-11.4, yesterday Hb of 13.8 likely lab error  8. HTN: stable, continue Amlodipine, clonidine, imdur, metoprolol  9. DVT prophylaxis: INR therapeutic on coumadin    Time spent coordinating care:   LOS: 4 days   [facility] Pager: [phone] 08/17/2012, 9:32 AM

## 2012-08-17 NOTE — Progress Notes (Signed)
[facility] ROUNDING NOTE   Subjective:   Interval History: appears improved  Objective:  Vital signs in last 24 hours:  Temp:  [98.2 F (36.8 C)-98.4 F (36.9 C)] 98.2 F (36.8 C) (10/15 0523) Pulse Rate:  [56-85] 56  (10/15 0523) Resp:  [16-20] 18  (10/15 0523) BP: (101-171)/(54-70) 168/64 mmHg (10/15 0523) SpO2:  [93 %-95 %] 93 % (10/15 0523) Weight:  [59.6 kg (131 lb 6.3 oz)-60.3 kg (132 lb 15 oz)] 60.3 kg (132 lb 15 oz) (10/14 2120)  Weight change: -0.7 kg (-1 lb 8.7 oz) Filed Weights   08/16/12 0530 08/16/12 1010 08/16/12 2120  Weight: 60.7 kg (133 lb 13.1 oz) 59.6 kg (131 lb 6.3 oz) 60.3 kg (132 lb 15 oz)    Intake/Output: I/O last 3 completed shifts: In: 120 [P.O.:120] Out: 1100 [Other:1100]   Intake/Output this shift:     General appearance: Alert, in no apparent distress  Resp: CTA without rales, rhonchi, or wheezes  Cardio: RRR without murmur or rub  GI: + BS, soft and nontender  Extremities: No edema  Access: Right IJ catheter, also AVF @ LUA with + bruit    Basic Metabolic Panel:  Lab 08/16/12 1610 08/14/12 0650 08/13/12 2014  NA 134* 139 136  K 3.2* 4.2 3.8  CL 96 97 97  CO2 27 22 23   GLUCOSE 197* 60* 200*  BUN 36* 57* 53*  CREATININE 5.01* 4.60* 4.51*  CALCIUM 8.3* 9.6 9.2  MG -- -- --  PHOS 4.1 -- --    Liver Function Tests:  Lab 08/16/12 0719 08/13/12 2014  AST -- 14  ALT -- 9  ALKPHOS -- 103  BILITOT -- 0.2*  PROT -- 6.9  ALBUMIN 2.9* 3.2*   No results found for this basename: LIPASE:5,AMYLASE:5 in the last 168 hours No results found for this basename: AMMONIA:3 in the last 168 hours  CBC:  Lab 08/17/12 0520 08/16/12 0719 08/15/12 0535 08/14/12 0650 08/13/12 2014  WBC 7.6 7.2 9.8 16.7* 13.8*  NEUTROABS -- -- -- -- 12.5*  HGB 11.2* 10.3* 10.7* 13.8 11.4*  HCT 34.3* 31.8* 33.2* 41.4 34.2*  MCV 88.2 87.8 88.3 87.5 86.4  PLT 216 212 216 235 234    Cardiac Enzymes: No results found for this basename:  CKTOTAL:5,CKMB:5,CKMBINDEX:5,TROPONINI:5 in the last 168 hours  BNP: No components found with this basename: POCBNP:5  CBG:  Lab 08/16/12 2119 08/16/12 1657 08/16/12 1138 08/16/12 1041 08/15/12 2136  GLUCAP 191* 231* 130* 102* 248*    Microbiology: Results for orders placed during the hospital encounter of 08/13/12  CULTURE, BLOOD (ROUTINE X 2)     Status: Normal   Collection Time   08/13/12  8:30 PM      Component Value Range Status Comment   Specimen Description BLOOD ARM RIGHT   Final    Special Requests BOTTLES DRAWN AEROBIC AND ANAEROBIC 10CC   Final    Culture  Setup Time 08/14/2012 01:08   Final    Culture     Final    Value: STAPHYLOCOCCUS SPECIES (COAGULASE NEGATIVE)     Note: SUSCEPTIBILITIES PERFORMED ON PREVIOUS CULTURE WITHIN THE LAST 5 DAYS.     Note: Gram Stain Report Called to,Read Back By and Verified With: Minna Merritts RN on 08/15/12 at 01:25 by Christie Nottingham   Report Status 08/16/2012 FINAL   Final   CULTURE, BLOOD (ROUTINE X 2)     Status: Normal   Collection Time   08/13/12  8:40 PM  Component Value Range Status Comment   Specimen Description BLOOD RIGHT HAND   Final    Special Requests BOTTLES DRAWN AEROBIC AND ANAEROBIC 10CC   Final    Culture  Setup Time 08/14/2012 01:08   Final    Culture     Final    Value: STAPHYLOCOCCUS SPECIES (COAGULASE NEGATIVE)     Note: RIFAMPIN AND GENTAMICIN SHOULD NOT BE USED AS SINGLE DRUGS FOR TREATMENT OF STAPH INFECTIONS.     Note: Gram Stain Report Called to,Read Back By and Verified With: KEJI ODEDERE 08/14/12 @ 8:45PM BY RUSCA.   Report Status 2012/09/07 FINAL   Final    Organism ID, Bacteria STAPHYLOCOCCUS SPECIES (COAGULASE NEGATIVE)   Final   CLOSTRIDIUM DIFFICILE BY PCR     Status: Normal   Collection Time   08/14/12 10:03 PM      Component Value Range Status Comment   C difficile by pcr NEGATIVE  NEGATIVE Final   MRSA PCR SCREENING     Status: Normal   Collection Time   09-07-12  2:29 PM      Component  Value Range Status Comment   MRSA by PCR NEGATIVE  NEGATIVE Final     Coagulation Studies:  Basename 08/17/12 0520 09-07-12 1227 08/15/12 0535  LABPROT 26.8* 26.9* 24.8*  INR 2.63* 2.64* 2.37*    Urinalysis:  Basename 08/14/12 0852  COLORURINE YELLOW  LABSPEC 1.017  PHURINE 5.5  GLUCOSEU NEGATIVE  HGBUR TRACE*  BILIRUBINUR NEGATIVE  KETONESUR NEGATIVE  PROTEINUR 100*  UROBILINOGEN 0.2  NITRITE NEGATIVE  LEUKOCYTESUR NEGATIVE      Imaging: No results found.   Medications:        . amLODipine  10 mg Oral Daily  . aspirin  81 mg Oral Daily  . calcium acetate  667 mg Oral BID WC  . cloNIDine  0.2 mg Oral BID  . influenza  inactive virus vaccine  0.5 mL Intramuscular Tomorrow-1000  . insulin aspart  0-15 Units Subcutaneous TID WC  . insulin aspart  0-5 Units Subcutaneous QHS  . insulin glargine  12 Units Subcutaneous QHS  . isosorbide mononitrate  120 mg Oral Daily  . levothyroxine  25 mcg Oral Q0600  . metoprolol tartrate  25 mg Oral BID  . pantoprazole  40 mg Oral Q1200  . pneumococcal 23 valent vaccine  0.5 mL Intramuscular Tomorrow-1000  . simvastatin  10 mg Oral q1800  . vancomycin  500 mg Intravenous Q M,W,F-HD  . Warfarin - Pharmacist Dosing Inpatient   Does not apply q1800  . DISCONTD: warfarin  2 mg Oral Daily  . DISCONTD: Warfarin - Pharmacist Dosing Inpatient   Does not apply q1800   acetaminophen, acetaminophen, heparin, heparin, HYDROcodone-acetaminophen, nitroGLYCERIN, sodium chloride  Assessment/ Plan:  1. Nausea/vomiting/diarrhea - probably due to bacteremia. Resolved. Blood cultures with Gr + cocci (? R IJ catheter as source). Getting IV Vancomycin, Improved 2. ESRD - HD on MWF @ [facility]; K 4.2. HD tomorrow. 3. Hypertension/volume - BP 123/43, on Clonidine 0.2 mg bid, Metoprolol 25 mg bid, and Amlodipine 10 mg qd; current wt 60.7 kg s/p net UF 1.4 L yesterday with EDW 60.5 kg.  4. Anemia - Hgb 10.7, no outpatient Epogen or Fe.  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 6. Nutrition - Alb 3.2. 7. New access - s/p left 1st stage BVT 7/16, 2nd stage BVT 9/24 per Dr. 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 Needs follow-up with [facility]. 8. 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 9. Hypothyroidism - on Synthroid.    LOS: 4 Tanaya Dunigan W @TODAY @7 :54 AM

## 2012-08-17 NOTE — Progress Notes (Signed)
ANTIBIOTIC CONSULT NOTE - FOLLOW UP  Pharmacy Consult:  Vancomycin Indication:  CoNS bacteremia  Allergies  Allergen Reactions  . Procaine Hcl Nausea And Vomiting and Other (See Comments)    "Pass out"    Patient Measurements: Height: 5\' 5"  (165.1 cm) Weight: 132 lb 15 oz (60.3 kg) IBW/kg (Calculated) : 57   Vital Signs: Temp: 98 F (36.7 C) (10/15 0833) Temp src: Oral (10/15 0833) BP: 143/65 mmHg (10/15 0833) Pulse Rate: 69  (10/15 0833) Intake/Output from previous day: 10/14 0701 - 10/15 0700 In: 120 [P.O.:120] Out: 1100  Intake/Output from this shift: Total I/O In: 240 [P.O.:240] Out: -   Labs:  Basename 08/17/12 0520 08/16/12 0719 08/15/12 0535  WBC 7.6 7.2 9.8  HGB 11.2* 10.3* 10.7*  PLT 216 212 216  LABCREA -- -- --  CREATININE -- 5.01* --   Estimated Creatinine Clearance: 9.5 ml/min (by C-G formula based on Cr of 5.01). No results found for this basename: VANCOTROUGH:2,VANCOPEAK:2,VANCORANDOM:2,GENTTROUGH:2,GENTPEAK:2,GENTRANDOM:2,TOBRATROUGH:2,TOBRAPEAK:2,TOBRARND:2,AMIKACINPEAK:2,AMIKACINTROU:2,AMIKACIN:2, in the last 72 hours   Microbiology: Recent Results (from the past 720 hour(s))  SURGICAL PCR SCREEN     Status: Normal   Collection Time   07/20/12  8:20 AM      Component Value Range Status Comment   MRSA, PCR NEGATIVE  NEGATIVE Final    Staphylococcus aureus NEGATIVE  NEGATIVE Final   CULTURE, BLOOD (ROUTINE X 2)     Status: Normal   Collection Time   08/13/12  8:30 PM      Component Value Range Status Comment   Specimen Description BLOOD ARM RIGHT   Final    Special Requests BOTTLES DRAWN AEROBIC AND ANAEROBIC 10CC   Final    Culture  Setup Time 08/14/2012 01:08   Final    Culture     Final    Value: STAPHYLOCOCCUS SPECIES (COAGULASE NEGATIVE)     Note: SUSCEPTIBILITIES PERFORMED ON PREVIOUS CULTURE WITHIN THE LAST 5 DAYS.     Note: Gram Stain Report Called to,Read Back By and Verified With: Minna Merritts RN on 08/15/12 at 01:25 by Christie Nottingham     Report Status 08/16/2012 FINAL   Final   CULTURE, BLOOD (ROUTINE X 2)     Status: Normal   Collection Time   08/13/12  8:40 PM      Component Value Range Status Comment   Specimen Description BLOOD RIGHT HAND   Final    Special Requests BOTTLES DRAWN AEROBIC AND ANAEROBIC 10CC   Final    Culture  Setup Time 08/14/2012 01:08   Final    Culture     Final    Value: STAPHYLOCOCCUS SPECIES (COAGULASE NEGATIVE)     Note: RIFAMPIN AND GENTAMICIN SHOULD NOT BE USED AS SINGLE DRUGS FOR TREATMENT OF STAPH INFECTIONS.     Note: Gram Stain Report Called to,Read Back By and Verified With: KEJI ODEDERE 08/14/12 @ 8:45PM BY RUSCA.   Report Status 08/16/2012 FINAL   Final    Organism ID, Bacteria STAPHYLOCOCCUS SPECIES (COAGULASE NEGATIVE)   Final   CLOSTRIDIUM DIFFICILE BY PCR     Status: Normal   Collection Time   08/14/12 10:03 PM      Component Value Range Status Comment   C difficile by pcr NEGATIVE  NEGATIVE Final   CULTURE, BLOOD (ROUTINE X 2)     Status: Normal (Preliminary result)   Collection Time   08/16/12 12:20 PM      Component Value Range Status Comment   Specimen Description BLOOD RIGHT ARM  Final    Special Requests BOTTLES DRAWN AEROBIC ONLY [facility]   Final    Culture  Setup Time 08/16/2012 18:29   Final    Culture     Final    Value:        BLOOD CULTURE RECEIVED NO GROWTH TO DATE CULTURE WILL BE HELD FOR 5 DAYS BEFORE ISSUING A FINAL NEGATIVE REPORT   Report Status PENDING   Incomplete   CULTURE, BLOOD (ROUTINE X 2)     Status: Normal (Preliminary result)   Collection Time   08/16/12 12:30 PM      Component Value Range Status Comment   Specimen Description BLOOD RIGHT HAND   Final    Special Requests BOTTLES DRAWN AEROBIC ONLY 3CC   Final    Culture  Setup Time 08/16/2012 18:29   Final    Culture     Final    Value:        BLOOD CULTURE RECEIVED NO GROWTH TO DATE CULTURE WILL BE HELD FOR 5 DAYS BEFORE ISSUING A FINAL NEGATIVE REPORT   Report Status PENDING   Incomplete    MRSA PCR SCREENING     Status: Normal   Collection Time   08/16/12  2:29 PM      Component Value Range Status Comment   MRSA by PCR NEGATIVE  NEGATIVE Final       Assessment: 41 YOF with ESRD on HD on MWF, continuing on vancomycin for CoNS bacteremia.  Patient's INR is being reversed for IJ catheter removal.  Vanc 10/11 >> 10/12, restarted 10/13>>  10/11 CDiff PCR - neg 10/11 BCx - 2/2 CoNS (sensitive to gent, rifampin, vanc) 10/14 BCx - pending    Goal of Therapy:  Vanc pre-HD level: 15-25 mcg/mL   Plan:  - Continue vanc 500mg  IV qHD on MWF - Check pre-HD vanc level tomorrow - F/U repeat blood cx - Continue to hold Coumadin and f/u with resumption post IJ removal - Daily PT / INR     Robert Sperl D. Laney Potash, PharmD, BCPS Pager:  [phone] 08/17/2012, 10:25 AM

## 2012-08-17 NOTE — Progress Notes (Signed)
Left radial pressure was 28 mmHg with fistula open and 160 mmHg with fistula compressed.  Left index finger was flat with fistula open and 60 mmHg with fistula compressed.

## 2012-08-18 ENCOUNTER — Inpatient Hospital Stay (HOSPITAL_COMMUNITY): Payer: Medicare Other

## 2012-08-18 LAB — GLUCOSE, CAPILLARY: Glucose-Capillary: 202 mg/dL — ABNORMAL HIGH (ref 70–99)

## 2012-08-18 LAB — RENAL FUNCTION PANEL
Albumin: 2.6 g/dL — ABNORMAL LOW (ref 3.5–5.2)
Calcium: 8.2 mg/dL — ABNORMAL LOW (ref 8.4–10.5)
GFR calc Af Amer: 9 mL/min — ABNORMAL LOW (ref >90)
Glucose, Bld: 183 mg/dL — ABNORMAL HIGH (ref 70–99)
Phosphorus: 3.2 mg/dL (ref 2.3–4.6)
Sodium: 130 mEq/L — ABNORMAL LOW (ref 135–145)

## 2012-08-18 LAB — CBC
Hemoglobin: 10.1 g/dL — ABNORMAL LOW (ref 12.0–15.0)
MCH: 27.9 pg (ref 26.0–34.0)
MCHC: 32.3 g/dL (ref 30.0–36.0)
RDW: 14 % (ref 11.5–15.5)

## 2012-08-18 LAB — PROTIME-INR: Prothrombin Time: 21.8 seconds — ABNORMAL HIGH (ref 11.6–15.2)

## 2012-08-18 NOTE — Progress Notes (Signed)
[facility]             Progress Note  Brief summary: [name] [name] is a 71/F with DM, COPD, PE on coumadin, ESRD on HD via HD catheter and recent AVF, presented to [facility] with N/V/D and fever subsequently found to have Coag negative staph bacteremia, stable on Vanc, plan for HD cath removal  Assessment/Plan:  1. Coagulase negative staph bacteremia 2/2 from 10/11 Sensitive to Vanc Source suspected to be HD Catheter related with seeding during recent AVF placement Continue Vancomycin, for HD catheter removal per VVS once INR <1.7 -INR trending down but still 1.99 today, follow catheter removal as above when INR less than 1.7 repeat blood cultures 10/14 with no growth to date  2. N/V/Diarrhea due to 1, resolved cdiff PCR negative  3. ESRD on HD MWF per renal  4. H/o PE on coumadin, hold coumadin, Vitamin K today, if INR drops significantly <1.7 start IV heparin in the interim  5. COPD/Chronic resp failure on 2-3L home O2, stable  6. DM: CBGs stable, improved, continue lantus, SSI  7. Anemia of chronic disease; stable, Hb 10.7-11.4, yesterday Hb of 13.8 likely lab error  8. HTN: stable, continue Amlodipine, clonidine, imdur, metoprolol  9. DVT prophylaxis: INR therapeutic on coumadin     Subjective: Chart reviewed, patient denies any complaints today-no nausea vomiting, tolerating by mouth well. No diarrhea reported  Objective: Vital signs in last 24 hours: Temp:  [97.4 F (36.3 C)-98.3 F (36.8 C)] 97.9 F (36.6 C) (10/16 1051) Pulse Rate:  [63-81] 81  (10/16 1051) Resp:  [18] 18  (10/16 1051) BP: (105-157)/(46-88) 139/63 mmHg (10/16 1051) SpO2:  [93 %-97 %] 94 % (10/16 0636) Weight:  [59.5 kg (131 lb 2.8 oz)-60.3 kg (132 lb 15 oz)] 59.5 kg (131 lb 2.8 oz) (10/16 1051) Weight change: 0.7 kg (1 lb 8.7 oz) Last BM Date: 08/15/12  Intake/Output from previous day: 10/15 0701 - 10/16 0700 In: 1080 [P.O.:1080] Out: -  Total I/O In: -  Out: 2400  [Other:2400]   Physical Exam: General: Alert, awake, oriented x3, in no acute distress. HEENT: No bruits, no goiter. R IJ HD catheter Heart: Regular rate and rhythm, without murmurs, rubs, gallops. Lungs: Clear to auscultation bilaterally. Abdomen: Soft, nontender, nondistended, positive bowel sounds. Extremities: No clubbing cyanosis or edema with positive pedal pulses, LUE AVF Neuro: Grossly intact, nonfocal.    Lab Results: Basic Metabolic Panel:  Basename 08/18/12 0711 08/16/12 0719  NA 130* 134*  K 3.5 3.2*  CL 94* 96  CO2 27 27  GLUCOSE 183* 197*  BUN 35* 36*  CREATININE 4.99* 5.01*  CALCIUM 8.2* 8.3*  MG -- --  PHOS 3.2 4.1   Liver Function Tests:  Basename 08/18/12 0711 08/16/12 0719  AST -- --  ALT -- --  ALKPHOS -- --  BILITOT -- --  PROT -- --  ALBUMIN 2.6* 2.9*   No results found for this basename: LIPASE:2,AMYLASE:2 in the last 72 hours No results found for this basename: AMMONIA:2 in the last 72 hours CBC:  Basename 08/18/12 0711 08/17/12 0520  WBC 8.4 7.6  NEUTROABS -- --  HGB 10.1* 11.2*  HCT 31.3* 34.3*  MCV 86.5 88.2  PLT 230 216   Cardiac Enzymes: No results found for this basename: CKTOTAL:3,CKMB:3,CKMBINDEX:3,TROPONINI:3 in the last 72 hours BNP: No results found for this basename: PROBNP:3 in the last 72 hours D-Dimer: No results found for this basename: DDIMER:2 in the last 72 hours CBG:  Basename 08/17/12  2048 08/17/12 1644 08/17/12 1131 08/17/12 0740 08/16/12 2119 08/16/12 1657  GLUCAP 269* 202* 298* 184* 191* 231*   Hemoglobin A1C: No results found for this basename: HGBA1C in the last 72 hours Fasting Lipid Panel: No results found for this basename: CHOL,HDL,LDLCALC,TRIG,CHOLHDL,LDLDIRECT in the last 72 hours Thyroid Function Tests: No results found for this basename: TSH,T4TOTAL,FREET4,T3FREE,THYROIDAB in the last 72 hours Anemia Panel: No results found for this basename: VITAMINB12,FOLATE,FERRITIN,TIBC,IRON,RETICCTPCT  in the last 72 hours Coagulation:  Basename 08/18/12 0711 08/17/12 0520  LABPROT 21.8* 26.8*  INR 1.99* 2.63*   Urine Drug Screen: Drugs of Abuse  No results found for this basename: labopia,  cocainscrnur,  labbenz,  amphetmu,  thcu,  labbarb    Alcohol Level: No results found for this basename: ETH:2 in the last 72 hours Urinalysis: No results found for this basename: COLORURINE:2,APPERANCEUR:2,LABSPEC:2,PHURINE:2,GLUCOSEU:2,HGBUR:2,BILIRUBINUR:2,KETONESUR:2,PROTEINUR:2,UROBILINOGEN:2,NITRITE:2,LEUKOCYTESUR:2 in the last 72 hours  Recent Results (from the past 240 hour(s))  CULTURE, BLOOD (ROUTINE X 2)     Status: Normal   Collection Time   08/13/12  8:30 PM      Component Value Range Status Comment   Specimen Description BLOOD ARM RIGHT   Final    Special Requests BOTTLES DRAWN AEROBIC AND ANAEROBIC 10CC   Final    Culture  Setup Time 08/14/2012 01:08   Final    Culture     Final    Value: STAPHYLOCOCCUS SPECIES (COAGULASE NEGATIVE)     Note: SUSCEPTIBILITIES PERFORMED ON PREVIOUS CULTURE WITHIN THE LAST 5 DAYS.     Note: Gram Stain Report Called to,Read Back By and Verified With: Minna Merritts RN on 08/15/12 at 01:25 by Christie Nottingham   Report Status 08/16/2012 FINAL   Final   CULTURE, BLOOD (ROUTINE X 2)     Status: Normal   Collection Time   08/13/12  8:40 PM      Component Value Range Status Comment   Specimen Description BLOOD RIGHT HAND   Final    Special Requests BOTTLES DRAWN AEROBIC AND ANAEROBIC 10CC   Final    Culture  Setup Time 08/14/2012 01:08   Final    Culture     Final    Value: STAPHYLOCOCCUS SPECIES (COAGULASE NEGATIVE)     Note: RIFAMPIN AND GENTAMICIN SHOULD NOT BE USED AS SINGLE DRUGS FOR TREATMENT OF STAPH INFECTIONS.     Note: Gram Stain Report Called to,Read Back By and Verified With: KEJI ODEDERE 08/14/12 @ 8:45PM BY RUSCA.   Report Status 08/16/2012 FINAL   Final    Organism ID, Bacteria STAPHYLOCOCCUS SPECIES (COAGULASE NEGATIVE)   Final    CLOSTRIDIUM DIFFICILE BY PCR     Status: Normal   Collection Time   08/14/12 10:03 PM      Component Value Range Status Comment   C difficile by pcr NEGATIVE  NEGATIVE Final   CULTURE, BLOOD (ROUTINE X 2)     Status: Normal (Preliminary result)   Collection Time   08/16/12 12:20 PM      Component Value Range Status Comment   Specimen Description BLOOD RIGHT ARM   Final    Special Requests BOTTLES DRAWN AEROBIC ONLY [facility]   Final    Culture  Setup Time 08/16/2012 18:29   Final    Culture     Final    Value:        BLOOD CULTURE RECEIVED NO GROWTH TO DATE CULTURE WILL BE HELD FOR 5 DAYS BEFORE ISSUING A FINAL NEGATIVE REPORT   Report Status PENDING  Incomplete   CULTURE, BLOOD (ROUTINE X 2)     Status: Normal (Preliminary result)   Collection Time   08/16/12 12:30 PM      Component Value Range Status Comment   Specimen Description BLOOD RIGHT HAND   Final    Special Requests BOTTLES DRAWN AEROBIC ONLY 3CC   Final    Culture  Setup Time 08/16/2012 18:29   Final    Culture     Final    Value:        BLOOD CULTURE RECEIVED NO GROWTH TO DATE CULTURE WILL BE HELD FOR 5 DAYS BEFORE ISSUING A FINAL NEGATIVE REPORT   Report Status PENDING   Incomplete   MRSA PCR SCREENING     Status: Normal   Collection Time   08/16/12  2:29 PM      Component Value Range Status Comment   MRSA by PCR NEGATIVE  NEGATIVE Final     Studies/Results: No results found.  Medications: Scheduled Meds:    . amLODipine  10 mg Oral Daily  . aspirin  81 mg Oral Daily  . calcium acetate  667 mg Oral BID WC  . cloNIDine  0.2 mg Oral BID  . insulin aspart  0-15 Units Subcutaneous TID WC  . insulin aspart  0-5 Units Subcutaneous QHS  . insulin glargine  12 Units Subcutaneous QHS  . isosorbide mononitrate  120 mg Oral Daily  . levothyroxine  25 mcg Oral Q0600  . metoprolol tartrate  25 mg Oral BID  . pantoprazole  40 mg Oral Q1200  . phytonadione (VITAMIN K) IV  5 mg Intravenous Once  . simvastatin  10 mg Oral  q1800  . vancomycin  500 mg Intravenous Q M,W,F-HD   Continuous Infusions:  PRN Meds:.acetaminophen, acetaminophen, heparin, heparin, heparin, HYDROcodone-acetaminophen, nitroGLYCERIN, sodium chloride    Time spent coordinating care:   LOS: 5 days   Cuca Benassi C [facility] Pager: [phone] 08/18/2012, 1:26 PM

## 2012-08-18 NOTE — Progress Notes (Signed)
[facility]    INR is 1.9 hold coumadin today  Will re-check INR tomorrow.  Plan removal of diatek when INR is less than 1.7 per request.  Thomasena Edis, Maximiano Lott MAUREEN PA-C

## 2012-08-18 NOTE — Progress Notes (Signed)
Inpatient Diabetes Program Recommendations  AACE/ADA: New Consensus Statement on Inpatient Glycemic Control (2013)  Target Ranges:  Prepandial:   less than 140 mg/dL      Peak postprandial:   less than 180 mg/dL (1-2 hours)      Critically ill patients:  140 - 180 mg/dL   Reason for Visit: CBGs 08-17-12  [phone] mg/dl     98-11-91   478 mg/dl      Inpatient Diabetes Program Recommendations Insulin - Basal: Consider a slight increase in Lantus to 15 units daily if CBGs continue greater than 180 mg/dl.  Note:Patient takes Lantus 18 units daily at home.

## 2012-08-18 NOTE — Progress Notes (Signed)
[facility] [place]  Daily Progress Note  Assessment/Planning: S/p L 2nd stage BVT   Patient has 132 mm Hg gradient so she clearly has steal in this left arm.  Unfortunately  this is her last arm access, so prior to ligation,  I would perform a left arm angiogram to see if there are any lesions that can be addressed.  I am going to give her 2 U FFP to facilitate this procedure given the slow response to Vitamin K.    She has agreed to proceed tomorrow with: Left arm angiogram with possible intervention,  possible left fistula ligation, and tunneled dialysis catheter removal. I discussed with the patient the nature of angiographic procedures, especially the limited patencies of any endovascular intervention.  The patient is aware of that the risks of an angiographic procedure include but are not limited to: bleeding, infection, access site complications, renal failure, embolization, rupture of vessel, dissection, possible need for emergent surgical intervention, possible need for surgical procedures to treat the patient's pathology, and stroke and death.   The patient is aware of the risks and agrees to proceed.  Subjective    Cont left hand numbness  Objective Filed Vitals:   08/18/12 1000 08/18/12 1030 08/18/12 1051 08/18/12 1255  BP: 124/88 157/53 139/63 138/69  Pulse: 72 75 81 79  Temp:   97.9 F (36.6 C) 98 F (36.7 C)  TempSrc:   Oral Oral  Resp:   18 20  Height:      Weight:   131 lb 2.8 oz (59.5 kg)   SpO2:    98%    Intake/Output Summary (Last 24 hours) at 08/18/12 1408 Last data filed at 08/18/12 1300  Gross per 24 hour  Intake    840 ml  Output   2400 ml  Net  -1560 ml    PULM  CTAB CV  RRR GI  soft, NTND VASC  L hand cool, hand grip 5/5, palpable thrill  Laboratory CBC    Component Value Date/Time   WBC 8.4 08/18/2012 0711   HGB 10.1* 08/18/2012 0711   HCT 31.3* 08/18/2012 0711   PLT 230 08/18/2012 0711    BMET      Component Value Date/Time   NA 130* 08/18/2012 0711   K 3.5 08/18/2012 0711   CL 94* 08/18/2012 0711   CO2 27 08/18/2012 0711   GLUCOSE 183* 08/18/2012 0711   BUN 35* 08/18/2012 0711   CREATININE 4.99* 08/18/2012 0711   CALCIUM 8.2* 08/18/2012 0711   GFRNONAA 8* 08/18/2012 0711   GFRAA 9* 08/18/2012 0711    Leonides Sake, MD [facility] [place] Office: [phone] Pager: [phone]  08/18/2012, 2:08 PM

## 2012-08-18 NOTE — Procedures (Signed)
I have seen and examined this patient and agree with the plan of care patient stable on dialysis. Tolerating well. She is using her catheter and has an immature fistula as access. She will need to continue catheter use as outpatient.  Shelbe Haglund W 08/18/2012, 7:48 AM

## 2012-08-19 ENCOUNTER — Encounter (HOSPITAL_COMMUNITY): Payer: Self-pay | Admitting: Critical Care Medicine

## 2012-08-19 ENCOUNTER — Encounter (HOSPITAL_COMMUNITY): Payer: Self-pay | Admitting: Certified Registered Nurse Anesthetist

## 2012-08-19 ENCOUNTER — Inpatient Hospital Stay (HOSPITAL_COMMUNITY): Payer: Medicare Other | Admitting: Critical Care Medicine

## 2012-08-19 ENCOUNTER — Encounter (HOSPITAL_COMMUNITY)
Admission: EM | Disposition: A | Discharge status: Home / Self Care | Payer: Self-pay | Source: Home / Self Care | Attending: Internal Medicine

## 2012-08-19 DIAGNOSIS — T82898A Other specified complication of vascular prosthetic devices, implants and grafts, initial encounter: Secondary | ICD-10-CM

## 2012-08-19 DIAGNOSIS — N186 End stage renal disease: Secondary | ICD-10-CM

## 2012-08-19 DIAGNOSIS — I10 Essential (primary) hypertension: Secondary | ICD-10-CM

## 2012-08-19 HISTORY — PX: ARCH AORTOGRAM: SHX5501

## 2012-08-19 LAB — GLUCOSE, CAPILLARY
Glucose-Capillary: 158 mg/dL — ABNORMAL HIGH (ref 70–99)
Glucose-Capillary: 162 mg/dL — ABNORMAL HIGH (ref 70–99)
Glucose-Capillary: 129 mg/dL — ABNORMAL HIGH (ref 70–99)
Glucose-Capillary: 49 mg/dL — ABNORMAL LOW (ref 70–99)
Glucose-Capillary: 257 mg/dL — ABNORMAL HIGH (ref 70–99)

## 2012-08-19 LAB — PROTIME-INR: Prothrombin Time: 16.1 seconds — ABNORMAL HIGH (ref 11.6–15.2)

## 2012-08-19 LAB — CBC
MCH: 28.7 pg (ref 26.0–34.0)
MCV: 87.3 fL (ref 78.0–100.0)
Platelets: 306 10*3/uL (ref 150–400)
RDW: 14.2 % (ref 11.5–15.5)

## 2012-08-19 LAB — PREPARE FRESH FROZEN PLASMA: Unit division: 0

## 2012-08-19 LAB — BASIC METABOLIC PANEL
CO2: 30 mEq/L (ref 19–32)
Calcium: 9.2 mg/dL (ref 8.4–10.5)
Creatinine, Ser: 3.5 mg/dL — ABNORMAL HIGH (ref 0.50–1.10)
Glucose, Bld: 108 mg/dL — ABNORMAL HIGH (ref 70–99)

## 2012-08-19 SURGERY — LIGATION OF ARTERIOVENOUS  FISTULA
Anesthesia: Monitor Anesthesia Care | Site: Arm Upper | Wound class: Clean

## 2012-08-19 MED ORDER — 0.9 % SODIUM CHLORIDE (POUR BTL) OPTIME
TOPICAL | Status: DC | PRN
  Administered 2012-08-19: 1000 mL

## 2012-08-19 MED ORDER — FENTANYL CITRATE 0.05 MG/ML IJ SOLN
INTRAMUSCULAR | Status: DC | PRN
  Administered 2012-08-19: 25 ug via INTRAVENOUS
  Administered 2012-08-19: 50 ug via INTRAVENOUS

## 2012-08-19 MED ORDER — ONDANSETRON HCL 4 MG/2ML IJ SOLN: 4.0000 mg | Freq: Once | INTRAMUSCULAR | Status: AC | PRN

## 2012-08-19 MED ORDER — MIDAZOLAM HCL 5 MG/5ML IJ SOLN
INTRAMUSCULAR | Status: DC | PRN
  Administered 2012-08-19: 2 mg via INTRAVENOUS

## 2012-08-19 MED ORDER — DARBEPOETIN ALFA-POLYSORBATE 60 MCG/0.3ML IJ SOLN
60.0000 ug | INTRAMUSCULAR | Status: DC
  Administered 2012-08-21: 60 ug via INTRAVENOUS
  Filled 2012-08-19: qty 0.3

## 2012-08-19 MED ORDER — BUPIVACAINE HCL (PF) 0.25 % IJ SOLN
INTRAMUSCULAR | Status: AC
  Filled 2012-08-19: qty 30

## 2012-08-19 MED ORDER — IODIXANOL 320 MG/ML IV SOLN
INTRAVENOUS | Status: DC | PRN
  Administered 2012-08-19: 150 mL

## 2012-08-19 MED ORDER — HYDROMORPHONE HCL PF 1 MG/ML IJ SOLN
INTRAMUSCULAR | Status: AC
  Administered 2012-08-19: 0.5 mg via INTRAVENOUS
  Filled 2012-08-19: qty 1

## 2012-08-19 MED ORDER — ACETAMINOPHEN 10 MG/ML IV SOLN
1000.0000 mg | Freq: Once | INTRAVENOUS | Status: AC | PRN
  Filled 2012-08-19: qty 100

## 2012-08-19 MED ORDER — PROPOFOL INFUSION 10 MG/ML OPTIME
INTRAVENOUS | Status: DC | PRN
  Administered 2012-08-19: 120 ug/kg/min via INTRAVENOUS

## 2012-08-19 MED ORDER — IODIXANOL 320 MG/ML IV SOLN
INTRAVENOUS | Status: DC | PRN
  Administered 2012-08-19: 50 mL

## 2012-08-19 MED ORDER — HYDROMORPHONE HCL PF 1 MG/ML IJ SOLN
0.2500 mg | INTRAMUSCULAR | Status: DC | PRN
  Administered 2012-08-19: 0.5 mg via INTRAVENOUS

## 2012-08-19 MED ORDER — BUPIVACAINE HCL (PF) 0.25 % IJ SOLN
INTRAMUSCULAR | Status: DC | PRN
  Administered 2012-08-19: 30 mL

## 2012-08-19 MED ORDER — THROMBIN 20000 UNITS EX SOLR
CUTANEOUS | Status: AC
  Filled 2012-08-19: qty 20000

## 2012-08-19 MED ORDER — PROPOFOL 10 MG/ML IV BOLUS
INTRAVENOUS | Status: DC | PRN
  Administered 2012-08-19: 30 mg via INTRAVENOUS

## 2012-08-19 MED ORDER — SODIUM CHLORIDE 0.9 % IR SOLN
Status: DC | PRN
  Administered 2012-08-19: 08:00:00

## 2012-08-19 MED ORDER — SODIUM CHLORIDE 0.9 % IV SOLN
INTRAVENOUS | Status: DC | PRN
  Administered 2012-08-19: 09:00:00 via INTRAVENOUS

## 2012-08-19 MED ORDER — LIDOCAINE-EPINEPHRINE (PF) 1 %-1:200000 IJ SOLN
INTRAMUSCULAR | Status: AC
  Filled 2012-08-19: qty 10

## 2012-08-19 MED ORDER — LIDOCAINE-EPINEPHRINE (PF) 1 %-1:200000 IJ SOLN
INTRAMUSCULAR | Status: DC | PRN
  Administered 2012-08-19: 30 mL

## 2012-08-19 SURGICAL SUPPLY — 69 items
BAG BANDED W/RUBBER/TAPE 36X54 (MISCELLANEOUS) ×4 IMPLANT
BAG SNAP BAND KOVER 36X36 (MISCELLANEOUS) ×4 IMPLANT
CANISTER SUCTION 2500CC (MISCELLANEOUS) ×4 IMPLANT
CATH ACCU-VU SIZ PIG 5F 100CM (CATHETERS) ×4 IMPLANT
CATH ACCU-VU SIZ PIG 5F 70CM (CATHETERS) ×4 IMPLANT
CATH ANGIO 5F BER2 100CM (CATHETERS) ×4 IMPLANT
CATH HEADHUNTER 5FR 65CM (MISCELLANEOUS) ×4 IMPLANT
CATH STRAIGHT 5FR 65CM (CATHETERS) ×4 IMPLANT
CLIP TI MEDIUM 6 (CLIP) ×4 IMPLANT
CLIP TI WIDE RED SMALL 6 (CLIP) ×4 IMPLANT
CLOTH BEACON ORANGE TIMEOUT ST (SAFETY) ×4 IMPLANT
COVER DOME SNAP 22 D (MISCELLANEOUS) ×4 IMPLANT
COVER PROBE W GEL 5X96 (DRAPES) ×4 IMPLANT
COVER SURGICAL LIGHT HANDLE (MISCELLANEOUS) ×4 IMPLANT
DERMABOND ADVANCED (GAUZE/BANDAGES/DRESSINGS) ×1
DERMABOND ADVANCED .7 DNX12 (GAUZE/BANDAGES/DRESSINGS) ×3 IMPLANT
DEVICE TORQUE H2O (MISCELLANEOUS) ×4 IMPLANT
DRAPE CHEST BREAST 15X10 FENES (DRAPES) ×4 IMPLANT
DRAPE ORTHO SPLIT 77X108 STRL (DRAPES) ×1
DRAPE SURG ORHT 6 SPLT 77X108 (DRAPES) ×3 IMPLANT
ELECT REM PT RETURN 9FT ADLT (ELECTROSURGICAL) ×4
ELECTRODE REM PT RTRN 9FT ADLT (ELECTROSURGICAL) ×3 IMPLANT
GAUZE SPONGE 2X2 8PLY STRL LF (GAUZE/BANDAGES/DRESSINGS) ×3 IMPLANT
GEL ULTRASOUND 20GR AQUASONIC (MISCELLANEOUS) IMPLANT
GLOVE BIO SURGEON STRL SZ7 (GLOVE) ×12 IMPLANT
GLOVE BIOGEL PI IND STRL 6.5 (GLOVE) ×9 IMPLANT
GLOVE BIOGEL PI IND STRL 7.0 (GLOVE) ×3 IMPLANT
GLOVE BIOGEL PI IND STRL 7.5 (GLOVE) ×9 IMPLANT
GLOVE BIOGEL PI INDICATOR 6.5 (GLOVE) ×3
GLOVE BIOGEL PI INDICATOR 7.0 (GLOVE) ×1
GLOVE BIOGEL PI INDICATOR 7.5 (GLOVE) ×3
GLOVE SURG SS PI 7.0 STRL IVOR (GLOVE) ×12 IMPLANT
GOWN STRL NON-REIN LRG LVL3 (GOWN DISPOSABLE) ×12 IMPLANT
GOWN STRL REIN XL XLG (GOWN DISPOSABLE) ×12 IMPLANT
GUIDEWIRE ANGLED .035X150CM (WIRE) ×4 IMPLANT
HEMOSTAT SURGICEL 2X14 (HEMOSTASIS) IMPLANT
KIT BASIN OR (CUSTOM PROCEDURE TRAY) ×4 IMPLANT
KIT ROOM TURNOVER OR (KITS) ×4 IMPLANT
NEEDLE HYPO 25GX1X1/2 BEV (NEEDLE) ×4 IMPLANT
NEEDLE PERC 18GX7CM (NEEDLE) ×4 IMPLANT
NS IRRIG 1000ML POUR BTL (IV SOLUTION) ×4 IMPLANT
PACK CV ACCESS (CUSTOM PROCEDURE TRAY) ×4 IMPLANT
PAD ARMBOARD 7.5X6 YLW CONV (MISCELLANEOUS) ×8 IMPLANT
SET MICROPUNCTURE 5F STIFF (MISCELLANEOUS) ×4 IMPLANT
SHEATH AVANTI 11CM 5FR (MISCELLANEOUS) ×4 IMPLANT
SPONGE GAUZE 2X2 STER 10/PKG (GAUZE/BANDAGES/DRESSINGS) ×1
SPONGE GAUZE 4X4 12PLY (GAUZE/BANDAGES/DRESSINGS) ×4 IMPLANT
SPONGE SURGIFOAM ABS GEL 100 (HEMOSTASIS) IMPLANT
STOPCOCK MORSE 400PSI 3WAY (MISCELLANEOUS) ×4 IMPLANT
SUT ETHILON 3 0 PS 1 (SUTURE) IMPLANT
SUT MNCRL AB 4-0 PS2 18 (SUTURE) ×8 IMPLANT
SUT PROLENE 6 0 BV (SUTURE) IMPLANT
SUT SILK 0 TIES 10X30 (SUTURE) ×8 IMPLANT
SUT VIC AB 3-0 SH 27 (SUTURE) ×2
SUT VIC AB 3-0 SH 27X BRD (SUTURE) ×6 IMPLANT
SWAB COLLECTION DEVICE MRSA (MISCELLANEOUS) IMPLANT
SYR 20CC LL (SYRINGE) ×4 IMPLANT
SYR CONTROL 10ML LL (SYRINGE) ×4 IMPLANT
SYR MEDRAD MARK V 150ML (SYRINGE) ×4 IMPLANT
SYRINGE 10CC LL (SYRINGE) ×8 IMPLANT
TAPE CLOTH SURG 4X10 WHT LF (GAUZE/BANDAGES/DRESSINGS) ×8 IMPLANT
TOWEL OR 17X24 6PK STRL BLUE (TOWEL DISPOSABLE) ×4 IMPLANT
TOWEL OR 17X26 10 PK STRL BLUE (TOWEL DISPOSABLE) ×8 IMPLANT
TUBE ANAEROBIC SPECIMEN COL (MISCELLANEOUS) IMPLANT
TUBING HIGH PRESSURE 120CM (CONNECTOR) ×4 IMPLANT
UNDERPAD 30X30 INCONTINENT (UNDERPADS AND DIAPERS) ×4 IMPLANT
WATER STERILE IRR 1000ML POUR (IV SOLUTION) ×4 IMPLANT
WIRE AMPLATZ SS-J .035X180CM (WIRE) ×4 IMPLANT
WIRE BENTSON .035X145CM (WIRE) ×8 IMPLANT

## 2012-08-19 NOTE — Progress Notes (Signed)
[facility]             Progress Note  Brief summary: [name] [name] is a 71/F with DM, COPD, PE on coumadin, ESRD on HD via HD catheter and recent AVF, presented to [facility] with N/V/D and fever subsequently found to have Coag negative staph bacteremia, stable on Vanc, plan for HD cath removal  Assessment/Plan:  1. Coagulase negative staph bacteremia 2/2 from 10/11 Sensitive to Vanc Source suspected to be HD Catheter related with seeding during recent AVF placement Continue Vancomycin,  -s/p HD catheter removal per VVS today 10/17 (INR <1.7; O.91) -repeat blood cultures 10/14 with no growth to date -appreciate VVS and renal assistance.  2. N/V/Diarrhea due to 1, resolved cdiff PCR negative  3. ESRD on HD MWF per renal  4. H/o PE on coumadin -follow and restart coumadin when ok with vvs.  5. COPD/Chronic resp failure on 2-3L home O2, stable  6. DM: CBGs stable, improved, continue lantus, SSI  7. Anemia of chronic disease; stable, Hb 10.7-11.4, yesterday Hb of 13.8 likely lab error  8. HTN: stable, continue Amlodipine, clonidine, imdur, metoprolol  9. DVT prophylaxis: INR therapeutic on coumadin     Subjective: S/p R. IJ tunneled catheter removal, she denies any c/o Objective: Vital signs in last 24 hours: Temp:  [97.5 F (36.4 C)-98.7 F (37.1 C)] 98.7 F (37.1 C) (10/17 1732) Pulse Rate:  [69-88] 71  (10/17 1732) Resp:  [14-24] 17  (10/17 1732) BP: (116-171)/(43-66) 130/46 mmHg (10/17 1732) SpO2:  [91 %-99 %] 99 % (10/17 1732) Weight:  [62.4 kg (137 lb 9.1 oz)] 62.4 kg (137 lb 9.1 oz) (10/17 0516) Weight change: -0.8 kg (-1 lb 12.2 oz) Last BM Date: 08/15/12  Intake/Output from previous day: 10/16 0701 - 10/17 0700 In: 540 [P.O.:240; I.V.:300] Out: 2400      Physical Exam: General: Alert, awake, oriented x3, in no acute distress. HEENT: No bruits, no goiter.  Heart: Regular rate and rhythm, without murmurs, rubs, gallops. Lungs: Clear to auscultation  bilaterally. Abdomen: Soft, nontender, nondistended, positive bowel sounds. Extremities: No clubbing cyanosis or edema with positive pedal pulses, LUE AVF Neuro: Grossly intact, nonfocal.    Lab Results: Basic Metabolic Panel:  Basename 08/19/12 1427 08/18/12 0711  NA 138 130*  K 3.6 3.5  CL 95* 94*  CO2 30 27  GLUCOSE 108* 183*  BUN 21 35*  CREATININE 3.50* 4.99*  CALCIUM 9.2 8.2*  MG -- --  PHOS -- 3.2   Liver Function Tests:  Basename 08/18/12 0711  AST --  ALT --  ALKPHOS --  BILITOT --  PROT --  ALBUMIN 2.6*   No results found for this basename: LIPASE:2,AMYLASE:2 in the last 72 hours No results found for this basename: AMMONIA:2 in the last 72 hours CBC:  Basename 08/19/12 1427 08/18/12 0711  WBC 10.1 8.4  NEUTROABS -- --  HGB 11.7* 10.1*  HCT 35.6* 31.3*  MCV 87.3 86.5  PLT 306 230   Cardiac Enzymes: No results found for this basename: CKTOTAL:3,CKMB:3,CKMBINDEX:3,TROPONINI:3 in the last 72 hours BNP: No results found for this basename: PROBNP:3 in the last 72 hours D-Dimer: No results found for this basename: DDIMER:2 in the last 72 hours CBG:  Basename 08/19/12 1813 08/19/12 1646 08/19/12 1240 08/19/12 1116 08/19/12 0731 08/19/12 0600  GLUCAP 137* 49* 129* 162* 158* 159*   Hemoglobin A1C: No results found for this basename: HGBA1C in the last 72 hours Fasting Lipid Panel: No results found for this basename: CHOL,HDL,LDLCALC,TRIG,CHOLHDL,LDLDIRECT  in the last 72 hours Thyroid Function Tests: No results found for this basename: TSH,T4TOTAL,FREET4,T3FREE,THYROIDAB in the last 72 hours Anemia Panel: No results found for this basename: VITAMINB12,FOLATE,FERRITIN,TIBC,IRON,RETICCTPCT in the last 72 hours Coagulation:  Basename 08/19/12 1427 08/19/12 0746  LABPROT 12.2 16.1*  INR 0.91 1.32   Urine Drug Screen: Drugs of Abuse  No results found for this basename: labopia,  cocainscrnur,  labbenz,  amphetmu,  thcu,  labbarb    Alcohol Level: No  results found for this basename: ETH:2 in the last 72 hours Urinalysis: No results found for this basename: COLORURINE:2,APPERANCEUR:2,LABSPEC:2,PHURINE:2,GLUCOSEU:2,HGBUR:2,BILIRUBINUR:2,KETONESUR:2,PROTEINUR:2,UROBILINOGEN:2,NITRITE:2,LEUKOCYTESUR:2 in the last 72 hours  Recent Results (from the past 240 hour(s))  CULTURE, BLOOD (ROUTINE X 2)     Status: Normal   Collection Time   08/13/12  8:30 PM      Component Value Range Status Comment   Specimen Description BLOOD ARM RIGHT   Final    Special Requests BOTTLES DRAWN AEROBIC AND ANAEROBIC 10CC   Final    Culture  Setup Time 08/14/2012 01:08   Final    Culture     Final    Value: STAPHYLOCOCCUS SPECIES (COAGULASE NEGATIVE)     Note: SUSCEPTIBILITIES PERFORMED ON PREVIOUS CULTURE WITHIN THE LAST 5 DAYS.     Note: Gram Stain Report Called to,Read Back By and Verified With: Minna Merritts RN on 08/15/12 at 01:25 by Christie Nottingham   Report Status 08/16/2012 FINAL   Final   CULTURE, BLOOD (ROUTINE X 2)     Status: Normal   Collection Time   08/13/12  8:40 PM      Component Value Range Status Comment   Specimen Description BLOOD RIGHT HAND   Final    Special Requests BOTTLES DRAWN AEROBIC AND ANAEROBIC 10CC   Final    Culture  Setup Time 08/14/2012 01:08   Final    Culture     Final    Value: STAPHYLOCOCCUS SPECIES (COAGULASE NEGATIVE)     Note: RIFAMPIN AND GENTAMICIN SHOULD NOT BE USED AS SINGLE DRUGS FOR TREATMENT OF STAPH INFECTIONS.     Note: Gram Stain Report Called to,Read Back By and Verified With: KEJI ODEDERE 08/14/12 @ 8:45PM BY RUSCA.   Report Status 08/16/2012 FINAL   Final    Organism ID, Bacteria STAPHYLOCOCCUS SPECIES (COAGULASE NEGATIVE)   Final   CLOSTRIDIUM DIFFICILE BY PCR     Status: Normal   Collection Time   08/14/12 10:03 PM      Component Value Range Status Comment   C difficile by pcr NEGATIVE  NEGATIVE Final   CULTURE, BLOOD (ROUTINE X 2)     Status: Normal (Preliminary result)   Collection Time   08/16/12 12:20  PM      Component Value Range Status Comment   Specimen Description BLOOD RIGHT ARM   Final    Special Requests BOTTLES DRAWN AEROBIC ONLY 7CC   Final    Culture  Setup Time 08/16/2012 18:29   Final    Culture     Final    Value:        BLOOD CULTURE RECEIVED NO GROWTH TO DATE CULTURE WILL BE HELD FOR 5 DAYS BEFORE ISSUING A FINAL NEGATIVE REPORT   Report Status PENDING   Incomplete   CULTURE, BLOOD (ROUTINE X 2)     Status: Normal (Preliminary result)   Collection Time   08/16/12 12:30 PM      Component Value Range Status Comment   Specimen Description BLOOD RIGHT HAND  Final    Special Requests BOTTLES DRAWN AEROBIC ONLY 3CC   Final    Culture  Setup Time 08/16/2012 18:29   Final    Culture     Final    Value:        BLOOD CULTURE RECEIVED NO GROWTH TO DATE CULTURE WILL BE HELD FOR 5 DAYS BEFORE ISSUING A FINAL NEGATIVE REPORT   Report Status PENDING   Incomplete   MRSA PCR SCREENING     Status: Normal   Collection Time   08/16/12  2:29 PM      Component Value Range Status Comment   MRSA by PCR NEGATIVE  NEGATIVE Final     Studies/Results: No results found.  Medications: Scheduled Meds:    . amLODipine  10 mg Oral Daily  . aspirin  81 mg Oral Daily  . calcium acetate  667 mg Oral BID WC  . cloNIDine  0.2 mg Oral BID  . darbepoetin (ARANESP) injection - DIALYSIS  60 mcg Intravenous Q Sat-HD  . insulin aspart  0-15 Units Subcutaneous TID WC  . insulin aspart  0-5 Units Subcutaneous QHS  . insulin glargine  12 Units Subcutaneous QHS  . isosorbide mononitrate  120 mg Oral Daily  . levothyroxine  25 mcg Oral Q0600  . metoprolol tartrate  25 mg Oral BID  . pantoprazole  40 mg Oral Q1200  . phytonadione (VITAMIN K) IV  5 mg Intravenous Once  . simvastatin  10 mg Oral q1800  . vancomycin  500 mg Intravenous Q M,W,F-HD   Continuous Infusions:  PRN Meds:.acetaminophen, acetaminophen, acetaminophen, heparin, heparin, heparin, HYDROcodone-acetaminophen, HYDROmorphone  (DILAUDID) injection, nitroGLYCERIN, ondansetron (ZOFRAN) IV, sodium chloride, DISCONTD: 0.9 % irrigation (POUR BTL), DISCONTD: bupivacaine, DISCONTD: heparin 6000 unit irrigation, DISCONTD: iodixanol, DISCONTD: iodixanol, DISCONTD: lidocaine-EPINEPHrine    Time spent coordinating care:   LOS: 6 days   Mattingly Fountaine C [facility] Pager: [phone] 08/19/2012, 9:22 PM

## 2012-08-19 NOTE — H&P (Signed)
[facility]   Brief History and Physical  History of Present Illness  [name] [name] is a 71 y.o. female who presents with chief complaint: steal L hand.  The patient presents today for L arm angiogram, possible ligation of AVF, and tunneled dialysis catheter removal.    Past Medical History  Diagnosis Date  . Diabetes mellitus   . Hypercholesterolemia   . Hypothyroidism   . Colitis   . Coronary artery disease   . Hypertension   . GERD (gastroesophageal reflux disease)   . Myocardial infarction   . Peripheral vascular disease   . Pneumonia   . Angina   . H/O Clostridium difficile infection   . Hx of pulmonary embolus   . COPD (chronic obstructive pulmonary disease)   . Shortness of breath     uses O2 as needed  . Chronic kidney disease     M-W-F   [place]    Past Surgical History  Procedure Date  . Tubal ligation   . Amputaton of right fifth toe   . Tonsillectomy   . Insertion of dialysis catheter   . Av fistula placement [id number]    right RUE AVF  . Bvt May 18, 2012    First stage, Basilic Vein Transposition  . Bvt 07/27/12    Left arm Basilic Vein Transposition    History   Social History  . Marital Status: Widowed    Spouse Name: N/A    Number of Children: N/A  . Years of Education: N/A   Occupational History  . Not on file.   Social History Main Topics  . Smoking status: Former Smoker -- 1.0 packs/day for 40 years    Types: Cigarettes    Quit date: 06/11/2011  . Smokeless tobacco: Never Used  . Alcohol Use: No  . Drug Use: No  . Sexually Active: Not Currently    Birth Control/ Protection: Post-menopausal   Other Topics Concern  . Not on file   Social History Narrative  . No narrative on file    Family History  Problem Relation Age of Onset  . Diabetes type II    . Hypertension    . Diabetes Mother   . Kidney disease Mother   . Diabetes Father   . Anesthesia problems Neg Hx     No current  facility-administered medications on file prior to encounter.   Current Outpatient Prescriptions on File Prior to Encounter  Medication Sig Dispense Refill  . amLODipine (NORVASC) 10 MG tablet Take 10 mg by mouth daily.       Marland Kitchen aspirin 81 MG chewable tablet Chew 81 mg by mouth daily.      . calcium acetate (PHOSLO) 667 MG capsule Take 667 mg by mouth 2 (two) times daily.       . cloNIDine (CATAPRES) 0.2 MG tablet Take 0.2 mg by mouth 2 (two) times daily.       . insulin glargine (LANTUS) 100 UNIT/ML injection Inject 18 Units into the skin at bedtime.      . isosorbide mononitrate (IMDUR) 120 MG 24 hr tablet Take 120 mg by mouth daily.       Marland Kitchen levothyroxine (SYNTHROID, LEVOTHROID) 25 MCG tablet Take 25 mcg by mouth daily.       . metoprolol tartrate (LOPRESSOR) 25 MG tablet Take 25 mg by mouth 2 (two) times daily.      . nitroGLYCERIN (NITROSTAT) 0.4 MG SL tablet Place 0.4 mg under the tongue every  5 (five) minutes as needed. For chest pain.      . pantoprazole (PROTONIX) 40 MG tablet Take 40 mg by mouth daily.       . Probiotic Product (PROBIOTIC PO) Take 1 capsule by mouth daily.      . simvastatin (ZOCOR) 10 MG tablet Take 10 mg by mouth daily at 6 PM.       . warfarin (COUMADIN) 2 MG tablet Take 2 mg by mouth daily.       . Insulin Syringe-Needle U-100 (INSULIN SYRINGE .5CC/28G) 28G X 1/2" 0.5 ML MISC daily.      Marland Kitchen DISCONTD: enoxaparin (LOVENOX) 60 MG/0.6ML SOLN Inject 0.55 mLs (55 mg total) into the skin daily.        Allergies  Allergen Reactions  . Procaine Hcl Nausea And Vomiting and Other (See Comments)    "Pass out"    Review of Systems: As listed above, otherwise negative.  Physical Examination  Filed Vitals:   08/18/12 2221 08/18/12 2302 08/18/12 2310 08/19/12 0516  BP: 171/66 144/61 152/64 134/62  Pulse: 88 83 82 72  Temp: 97.6 F (36.4 C) 97.6 F (36.4 C) 97.7 F (36.5 C) 97.7 F (36.5 C)  TempSrc: Oral Oral Oral Oral  Resp: 18 20 18 18   Height:      Weight:     137 lb 9.1 oz (62.4 kg)  SpO2:    95%    General: A&O x 3, WDWN  Pulmonary: Sym exp, good air movt, CTAB, no rales, rhonchi, & wheezing  Cardiac: RRR, Nl S1, S2, no Murmurs, rubs or gallops  Gastrointestinal: soft, NTND, -G/R, - HSM, - masses, - CVAT B  Musculoskeletal: M/S 5/5 throughout , Extremities without ischemic changes , palpable thrill, cool left hand with intact motor  Laboratory See iStat  Medical Decision Making  [name] [name] is a 71 y.o. female who presents with: steal syndrome L arm.   The patient is scheduled for: L arm angiogram, possible ligation of fistula, and tunneled dialysis catheter removal. I discussed with the patient the nature of angiographic procedures, especially the limited patencies of any endovascular intervention.  The patient is aware of that the risks of an angiographic procedure include but are not limited to: bleeding, infection, access site complications, renal failure, embolization, rupture of vessel, dissection, possible need for emergent surgical intervention, possible need for surgical procedures to treat the patient's pathology, and stroke and death.    The patient is aware of the risks and agrees to proceed.  Leonides Sake, MD [facility] [place] Office: [phone] Pager: [phone]  08/19/2012, 7:14 AM

## 2012-08-19 NOTE — Transfer of Care (Signed)
Immediate Anesthesia Transfer of Care Note  Patient: [name] [name]  Procedure(s) Performed: Procedure(s) (LRB) with comments: LIGATION OF ARTERIOVENOUS  FISTULA (Left) - Ultrasound guided. REMOVAL OF A DIALYSIS CATHETER (N/A) ARCH AORTOGRAM (N/A) ANGIOGRAM EXTREMITY LEFT (Left)  Patient Location: PACU  Anesthesia Type: MAC  Level of Consciousness: awake, alert , oriented and patient cooperative  Airway & Oxygen Therapy: Patient Spontanous Breathing and Patient connected to nasal cannula oxygen  Post-op Assessment: Report given to PACU RN, Post -op Vital signs reviewed and stable and Patient moving all extremities X 4  Post vital signs: Reviewed and stable  Complications: No apparent anesthesia complications

## 2012-08-19 NOTE — Op Note (Signed)
OPERATIVE NOTE   PROCEDURE: Right internal jugular vein tunneled dialysis catheter removal Left common femoral artery cannulation with ultrasound guidance Arch aortogram Second order arterial selection Left arm angiogram Left fistula ligation  PRE-OPERATIVE DIAGNOSIS: Infected right internal jugular vein tunneled dialysis catheter, Severe left arm steal syndrome  POST-OPERATIVE DIAGNOSIS: same as above   SURGEON: Leonides Sake, MD  ANESTHESIA: local and MAC  ESTIMATED BLOOD LOSS: 50 cc  FINDING(S): 1. No obvious evidence of right internal jugular vein tunneled dialysis catheter infection 2. Ultrasound evidence of >75% luminal compromise of left common femoral artery  3. Patent type I aortic arch 4. 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 Patent axillary and brachial artery 6. Left radial and ulnar arteries not visualized due to severe steal: minimal perfusion distal to anastomosis 7. Widely patent basilic vein transposition  8. Strongly dopplerable left radial signal at end of case  SPECIMEN(S):  Anaerobic and aerobic culture of catheter tip.  INDICATIONS:   [name] [name] is a 72 y.o. female who presents with steal symptoms in left hand after second stage basilic vein transposition.  Her recent left arm arterial studies demonstrate a >100 mm Hg differential between compressed and functional states in the left basilic vein transposition.  I had already ligated a right basilic vein transposition so to try to salvage any attempt at an arm access, I felt an left arm angiogram was necessary.  If there was no endovascular intervention possible, I felt ligation was going to be necessary.  The nephrology service had also requested removed of an infection right internal jugular vein tunneled dialysis catheter.  Unfortunately she was anticoagulated so it was delayed for several days, so I offered to remove it at the time of the procedure.  I  discussed with the patient the nature of angiographic procedures, especially the limited patencies of any endovascular intervention.  The patient is aware of that the risks of an angiographic procedure include but are not limited to: bleeding, infection, access site complications, renal failure, embolization, rupture of vessel, dissection, possible need for emergent surgical intervention, possible need for surgical procedures to treat the patient's pathology, and stroke and death.  The patient is aware of the risks and agrees to proceed.  DESCRIPTION: After obtain full informed written consent, the patient was brought back to the operating room and placed supine upon the operating table.  The patient received IV antibiotics prior to induction.  After obtaining adequate anesthesia, the patient was prepped and draped in the standard fashion for: right chest tunneled dialysis catheter removal.  I injected 5 cc of 1% lidocaine with epinephrine around the cuff of the tunneled dialysis catheter.  Using a hemostat, I dissected out the cuff bluntly and then disrupted the surround tissue.  The tunneled dialysis catheter was removed and pressure held to the right internal jugular vein for 5 minutes.  No further bleeding was noted.   The tip of catheter was cut off and sent as a specimen.  The exit site was bandaged with a sterile gauze and affixed with tape.  The drapes were removed and the patient was prepped and draped for a left femoral cannulation and left arm access.  Under ultrasound guidance, I cannulated the left common femoral artery, which was quite calcified, with a micropuncture needle.  A microwire was loaded into the left iliac arterial system and the needle exchanged for a microsheath.  The wire was exchanged for a [employer]  wire which was advanced into the aorta.  I tried to exchange the sheath for a 5 Fr but the sheath would not advance through the calcification.  I obtained a end-hole catheter and loaded  it over the wire into the aorta.  The wire was exchanged for an Amplatz wire.  At this point the patient began moving uncontrollably, causing her to develop a left groin hematoma.  Eventually, she calmed down.  The catheter was removed and the 5-Fr sheath was loaded over the wire into the left common femoral artery.  The pigtail catheter was loaded over the wire and advanced into the ascending aorta.  The wire was removed and the catheter was connected to the power injector circuit.  A declotting and deairring maneuver was executed.  The image intensified was positioned into 40 degree LAO.  An arch aortogram was performed.  I then exchanged the catheter in the descending aorta for a H1 catheter.  Unfortunately using this combination, I could not cannulated the left subclavian artery.  The catheter was exchanged for a BER-2 catheter.  Using this combination, I could select the left subclavian artery but not advance the wire.   The wire was exchanged for a glidewire.  Using the BER-2 with glidewire, I was able to select the left subclavian artery and advance into the left axillary artery.  The catheter was connected to the power injector circuit.  The left arm was imaged in stations.  The findings are listed above.  Based on these findings, I felt ligation of the fistula was this patient's best options.  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  Extensive stenting is associated with higher rate of occlusion, so I felt this was not an option here.  Unprotected angioplasty in this case is likely to cause rupture or dissect due to the extensive calcification in this patient.  Also I did not think the improvement in perfusion with a left subclavian to carotid bypass would reverse the severe steal in this case.  Also I doubt a DRIL, will address this patient's disease due to concomitant proximal disease.  Unfortunately, the best chance to avoid irreversible ischemia in the left  hand is ligation of the widely patent fistula.   At this point, I aspirated the left femoral sheath and reloaded it with heparinized saline.  I then turned my attention to the left arm.  I injected another 10 cc of 1% lidocaine with epinephrine in the distal end of the previous incision.  I opened up about 5 cm of the incision and dissected down through the underlying subcutaneous tissue until I reached the fistula.  Surprisingly the subcutaneous tissue had limited healing.  I looped the fistula distal to the anastomosis and ligated the fistula with four 0 ties distal to the anastomosis, thus leaving a vein patch on the artery.  I transected the fistula.  Distally the previously faint radial signal became strongly dopperable.  I reapproximated the subcutaneous tissue with a double layer of 3-0 Vicryl.  The skin was reapproximated with a running stitch of 4-0 Monocryl.  The skin was cleaned, dried, and reinforced with Dermabond.  At this point, I aspirated from the left femoral sheath.  There was no clot present.  I pulled the sheath and held pressure for 15 minutes.  There was no bleeding from this groin at this point.  COMPLICATIONS: none  CONDITION: stable  Leonides Sake, MD [facility] [place] Office: [phone]  Pager: [phone]  08/19/2012, 1:02 PM

## 2012-08-19 NOTE — Progress Notes (Signed)
Subjective:  In recovery room post op Dr. Imogene Burn Procedure, Alert No cos I saw [name] [name] in her room--she's awake and alert--offering me some grapes!  She says L hand is not hurting following ligation of L upper arm AVF and "my circulation is no good!"  Thanks to Dr. Imogene Burn for his help  Objective Vital signs in last 24 hours: Filed Vitals:   08/18/12 2310 08/19/12 0516 08/19/12 1115 08/19/12 1130  BP: 152/64 134/62 116/48 148/44  Pulse: 82 72 70 72  Temp: 97.7 F (36.5 C) 97.7 F (36.5 C) 97.5 F (36.4 C)   TempSrc: Oral Oral    Resp: 18 18 21 24   Height:      Weight:  62.4 kg (137 lb 9.1 oz)    SpO2:  95% 91% 95%   Weight change: -0.8 kg (-1 lb 12.2 oz)  Intake/Output Summary (Last 24 hours) at 08/19/12 1147 Last data filed at 08/19/12 1036  Gross per 24 hour  Intake    840 ml  Output      0 ml  Net    840 ml   Labs: Basic Metabolic Panel:  Lab 08/18/12 4098 08/16/12 0719 08/14/12 0650  NA 130* 134* 139  K 3.5 3.2* 4.2  CL 94* 96 97  CO2 27 27 22   GLUCOSE 183* 197* 60*  BUN 35* 36* 57*  CREATININE 4.99* 5.01* 4.60*  CALCIUM 8.2* 8.3* 9.6  ALB -- -- --  PHOS 3.2 4.1 --   Liver Function Tests:  Lab 08/18/12 0711 08/16/12 0719 08/13/12 2014  AST -- -- 14  ALT -- -- 9  ALKPHOS -- -- 103  BILITOT -- -- 0.2*  PROT -- -- 6.9  ALBUMIN 2.6* 2.9* 3.2*   No results found for this basename: LIPASE:3,AMYLASE:3 in the last 168 hours No results found for this basename: AMMONIA:3 in the last 168 hours CBC:  Lab 08/18/12 0711 08/17/12 0520 08/16/12 0719 08/15/12 0535 08/14/12 0650 08/13/12 2014  WBC 8.4 7.6 7.2 -- -- --  NEUTROABS -- -- -- -- -- 12.5*  HGB 10.1* 11.2* 10.3* -- -- --  HCT 31.3* 34.3* 31.8* -- -- --  MCV 86.5 88.2 87.8 88.3 87.5 --  PLT 230 216 212 -- -- --   Cardiac Enzymes: No results found for this basename: CKTOTAL:5,CKMB:5,CKMBINDEX:5,TROPONINI:5 in the last 168 hours CBG:  Lab 08/19/12 1116 08/19/12 0731 08/19/12 0600 08/18/12 2104 08/18/12 1639    GLUCAP 162* 158* 159* 237* 298*    Iron Studies: No results found for this basename: IRON,TIBC,TRANSFERRIN,FERRITIN in the last 72 hours Studies/Results: No results found. Medications:      . amLODipine  10 mg Oral Daily  . aspirin  81 mg Oral Daily  . calcium acetate  667 mg Oral BID WC  . cloNIDine  0.2 mg Oral BID  . insulin aspart  0-15 Units Subcutaneous TID WC  . insulin aspart  0-5 Units Subcutaneous QHS  . insulin glargine  12 Units Subcutaneous QHS  . isosorbide mononitrate  120 mg Oral Daily  . levothyroxine  25 mcg Oral Q0600  . metoprolol tartrate  25 mg Oral BID  . pantoprazole  40 mg Oral Q1200  . phytonadione (VITAMIN K) IV  5 mg Intravenous Once  . simvastatin  10 mg Oral q1800  . vancomycin  500 mg Intravenous Q M,W,F-HD   I  have reviewed scheduled and prn medications.  Physical Exam: General: Alert, NAD,appropriate post  op in recovery room Heart: RRR, no rub  or murmur Lungs: CTA bilaterally Abdomen: BS=+, soft, nontender Extremities: Dialysis Access: removed R IJ tunneled cath.                     no pedal edema,left foot dressing not removed/dressing dr Brigid Re in L femoral  Problem/Plan: 1.  Coag. Neg. Staph Bacteremia 08/13/12 BC= Probable perm cath etiology , removed today/ On vancomycin Pharmacy  Protocol. Will need new tunneled dialysis cath  In ~ 48 hr 2. ESRD -  MWF ([facility] ) HD  yesterday, 3.5 k this am/ As D/W Dr. Imogene Burn for Sat 08/21/12 perm cath. 3. L hand steal Syndrome=Dr. Imogene Burn angiogram results PENDING/ Ligated AVF today 4. Anemia - hgb10.1  Start Aranesp  Weekly.hd sat , then changed to wed when back on [facility] hd schedule 5. Secondary hyperparathyroidism -phos 3.2 and Ca=8.2( corrected=9.6) on phoslo 6. HTN/volume - stable on Lopressor 25mg  bid, imdur 25mg  q day. Clonidine.2mg  bid,amloodipine 10mg  qday,  Weight 62.4 kg with edw=60.5 kg 6. HO PE = coumadin on hold  Tunneled HD cath removedl and reinsert on Saturday/ inr = 1.32 7. IDDM  type 2= Lantus, ssi  [name] Pastel, PA-C [facility] Beeper [phone]  I examined [name] [name] in her room.  She's wide awake following procedures earlier today.  New tunneled HD cath planned for Saturday.  A'gram will help vascular surgeons decide whether there's a location to put new permanent vascular access.  She remains on antibiotics. Will check chem ion AM to be sure she doesn't need HD sooner than Saturday.   08/19/2012,11:47 AM  LOS: 6 days

## 2012-08-19 NOTE — Anesthesia Preprocedure Evaluation (Addendum)
Anesthesia Evaluation  Patient identified by MRN, date of birth, ID band Patient awake    Reviewed: Allergy & Precautions, H&P , NPO status , Patient's Chart, lab work & pertinent test results, reviewed documented beta blocker date and time   Airway Mallampati: II TM Distance: >3 FB     Dental  (+) Edentulous Upper and Lower Dentures   Pulmonary shortness of breath and with exertion, pneumonia -, COPD oxygen dependent,  breath sounds clear to auscultation        Cardiovascular hypertension, Pt. on medications and Pt. on home beta blockers - angina+ CAD, + Past MI and +CHF Rhythm:Regular Rate:Normal     Neuro/Psych    GI/Hepatic   Endo/Other  diabetes, Poorly Controlled, Type 2, Insulin DependentHypothyroidism   Renal/GU DialysisRenal disease     Musculoskeletal   Abdominal   Peds  Hematology   Anesthesia Other Findings   Reproductive/Obstetrics                        Anesthesia Physical Anesthesia Plan  ASA: III  Anesthesia Plan: MAC   Post-op Pain Management:    Induction: Intravenous  Airway Management Planned: Natural Airway  Additional Equipment:   Intra-op Plan:   Post-operative Plan:   Informed Consent: I have reviewed the patients History and Physical, chart, labs and discussed the procedure including the risks, benefits and alternatives for the proposed anesthesia with the patient or authorized representative who has indicated his/her understanding and acceptance.     Plan Discussed with:   Anesthesia Plan Comments:         Anesthesia Quick Evaluation

## 2012-08-19 NOTE — Progress Notes (Signed)
Chart review complete.  Patient is not eligible for [facility] Care Management services because his/her PCP is not a [facility] primary care provider or is not [facility] affiliated.  For any additional questions or new referrals please contact Tim Henderson BSN RN [facility] Liaison at [phone] °

## 2012-08-19 NOTE — Anesthesia Postprocedure Evaluation (Signed)
Anesthesia Post Note  Patient: [name] [name]  Procedure(s) Performed: Procedure(s) (LRB): LIGATION OF ARTERIOVENOUS  FISTULA (Left) REMOVAL OF A DIALYSIS CATHETER (N/A) ARCH AORTOGRAM (N/A) ANGIOGRAM EXTREMITY LEFT (Left)  Anesthesia type: general  Patient location: PACU  Post pain: Pain level controlled  Post assessment: Patient's Cardiovascular Status Stable  Last Vitals:  Filed Vitals:   08/19/12 1145  BP: 149/43  Pulse:   Temp:   Resp:     Post vital signs: Reviewed and stable  Level of consciousness: sedated  Complications: No apparent anesthesia complications

## 2012-08-19 NOTE — OR Nursing (Signed)
Removal of diatek start at 0909; end at 0917. Start of angiogram, aortogram, and ligation of left arm fistula at 0938; end at 1059.

## 2012-08-20 ENCOUNTER — Encounter (HOSPITAL_COMMUNITY): Payer: Self-pay | Admitting: Vascular Surgery

## 2012-08-20 DIAGNOSIS — E039 Hypothyroidism, unspecified: Secondary | ICD-10-CM

## 2012-08-20 LAB — CBC
HCT: 32.6 % — ABNORMAL LOW (ref 36.0–46.0)
Hemoglobin: 10.5 g/dL — ABNORMAL LOW (ref 12.0–15.0)
MCH: 28.1 pg (ref 26.0–34.0)
MCHC: 32.2 g/dL (ref 30.0–36.0)
RBC: 3.74 MIL/uL — ABNORMAL LOW (ref 3.87–5.11)

## 2012-08-20 LAB — BASIC METABOLIC PANEL
BUN: 27 mg/dL — ABNORMAL HIGH (ref 6–23)
CO2: 30 mEq/L (ref 19–32)
GFR calc non Af Amer: 9 mL/min — ABNORMAL LOW (ref >90)
Glucose, Bld: 163 mg/dL — ABNORMAL HIGH (ref 70–99)
Potassium: 3.6 mEq/L (ref 3.5–5.1)

## 2012-08-20 LAB — GLUCOSE, CAPILLARY
Glucose-Capillary: 185 mg/dL — ABNORMAL HIGH (ref 70–99)
Glucose-Capillary: 242 mg/dL — ABNORMAL HIGH (ref 70–99)
Glucose-Capillary: 219 mg/dL — ABNORMAL HIGH (ref 70–99)

## 2012-08-20 MED ORDER — VANCOMYCIN HCL 1000 MG IV SOLR
750.0000 mg | INTRAVENOUS | Status: AC
  Administered 2012-08-21: 750 mg via INTRAVENOUS
  Filled 2012-08-20 (×2): qty 750

## 2012-08-20 MED ORDER — VANCOMYCIN HCL 1000 MG IV SOLR
750.0000 mg | INTRAVENOUS | Status: DC
  Administered 2012-08-23: 750 mg via INTRAVENOUS
  Filled 2012-08-20: qty 750

## 2012-08-20 NOTE — Progress Notes (Signed)
[facility]             Progress Note  Brief summary: [name] [name] is a 71/F with DM, COPD, PE on coumadin, ESRD on HD via HD catheter and recent AVF, presented to [facility] with N/V/D and fever subsequently found to have Coag negative staph bacteremia, stable on Vanc, plan for HD cath removal  Assessment/Plan:  1. Coagulase negative staph bacteremia 2/2 from 10/11 Sensitive to Vanc Source suspected to be HD Catheter related with seeding during recent AVF placement Continue Vancomycin,  -s/p HD catheter removal per VVS today 10/17 (INR <1.7; O.91) -repeat blood cultures 10/14 with no growth to date -appreciate [facility] and renal assistance. -Plan is for new dialysis catheter placement in a.m. per [facility]  2. N/V/Diarrhea due to 1, resolved cdiff PCR negative  3. ESRD on HD MWF per renal  4. H/o PE on coumadin -follow and restart coumadin when ok with vvs.  5. COPD/Chronic resp failure on 2-3L home O2, stable  6. DM: CBGs stable, improved, continue lantus, SSI  7. Anemia of chronic disease; stable, Hb 10.7-11.4, yesterday Hb of 13.8 likely lab error  8. HTN: stable, continue Amlodipine, clonidine, imdur, metoprolol  9. DVT prophylaxis: INR therapeutic on coumadin     Subjective: Patient sitting up in a chair, she denies any complaints this a.m. Tolerating by mouth well, no diarrhea Objective: Vital signs in last 24 hours: Temp:  [97.3 F (36.3 C)-98.7 F (37.1 C)] 97.4 F (36.3 C) (10/18 0953) Pulse Rate:  [70-78] 72  (10/18 0953) Resp:  [17-20] 20  (10/18 0953) BP: (130-165)/(46-83) 165/77 mmHg (10/18 0953) SpO2:  [95 %-99 %] 99 % (10/18 0953) Weight:  [62.4 kg (137 lb 9.1 oz)] 62.4 kg (137 lb 9.1 oz) (10/17 2146) Weight change: 2.9 kg (6 lb 6.3 oz) Last BM Date: 08/15/12  Intake/Output from previous day: 10/17 0701 - 10/18 0700 In: 300 [I.V.:300] Out: 250 [Urine:250] Total I/O In: 240 [P.O.:240] Out: -    Physical Exam: General: Alert, awake, oriented x3, in  no acute distress. HEENT: No bruits, no goiter.  Heart: Regular rate and rhythm, without murmurs, rubs, gallops. Lungs: Clear to auscultation bilaterally. Abdomen: Soft, nontender, nondistended, positive bowel sounds. Extremities: No clubbing cyanosis or edema with positive pedal pulses Neuro: Grossly intact, nonfocal.    Lab Results: Basic Metabolic Panel:  Basename 08/20/12 0540 08/19/12 1427 08/18/12 0711  NA 136 138 --  K 3.6 3.6 --  CL 94* 95* --  CO2 30 30 --  GLUCOSE 163* 108* --  BUN 27* 21 --  CREATININE 4.55* 3.50* --  CALCIUM 8.9 9.2 --  MG -- -- --  PHOS -- -- 3.2   Liver Function Tests:  Basename 08/18/12 0711  AST --  ALT --  ALKPHOS --  BILITOT --  PROT --  ALBUMIN 2.6*   No results found for this basename: LIPASE:2,AMYLASE:2 in the last 72 hours No results found for this basename: AMMONIA:2 in the last 72 hours CBC:  Basename 08/20/12 0540 08/19/12 1427  WBC 9.5 10.1  NEUTROABS -- --  HGB 10.5* 11.7*  HCT 32.6* 35.6*  MCV 87.2 87.3  PLT 314 306   Cardiac Enzymes: No results found for this basename: CKTOTAL:3,CKMB:3,CKMBINDEX:3,TROPONINI:3 in the last 72 hours BNP: No results found for this basename: PROBNP:3 in the last 72 hours D-Dimer: No results found for this basename: DDIMER:2 in the last 72 hours CBG:  Basename 08/20/12 1126 08/20/12 0754 08/19/12 2148 08/19/12 1813 08/19/12 1646 08/19/12 1240  GLUCAP 242* 185* 257* 137* 49* 129*   Hemoglobin A1C: No results found for this basename: HGBA1C in the last 72 hours Fasting Lipid Panel: No results found for this basename: CHOL,HDL,LDLCALC,TRIG,CHOLHDL,LDLDIRECT in the last 72 hours Thyroid Function Tests: No results found for this basename: TSH,T4TOTAL,FREET4,T3FREE,THYROIDAB in the last 72 hours Anemia Panel: No results found for this basename: VITAMINB12,FOLATE,FERRITIN,TIBC,IRON,RETICCTPCT in the last 72 hours Coagulation:  Basename 08/19/12 1427 08/19/12 0746  LABPROT 12.2 16.1*   INR 0.91 1.32   Urine Drug Screen: Drugs of Abuse  No results found for this basename: labopia,  cocainscrnur,  labbenz,  amphetmu,  thcu,  labbarb    Alcohol Level: No results found for this basename: ETH:2 in the last 72 hours Urinalysis: No results found for this basename: COLORURINE:2,APPERANCEUR:2,LABSPEC:2,PHURINE:2,GLUCOSEU:2,HGBUR:2,BILIRUBINUR:2,KETONESUR:2,PROTEINUR:2,UROBILINOGEN:2,NITRITE:2,LEUKOCYTESUR:2 in the last 72 hours  Recent Results (from the past 240 hour(s))  CULTURE, BLOOD (ROUTINE X 2)     Status: Normal   Collection Time   08/13/12  8:30 PM      Component Value Range Status Comment   Specimen Description BLOOD ARM RIGHT   Final    Special Requests BOTTLES DRAWN AEROBIC AND ANAEROBIC 10CC   Final    Culture  Setup Time 08/14/2012 01:08   Final    Culture     Final    Value: STAPHYLOCOCCUS SPECIES (COAGULASE NEGATIVE)     Note: SUSCEPTIBILITIES PERFORMED ON PREVIOUS CULTURE WITHIN THE LAST 5 DAYS.     Note: Gram Stain Report Called to,Read Back By and Verified With: Minna Merritts RN on 08/15/12 at 01:25 by Christie Nottingham   Report Status 08/16/2012 FINAL   Final   CULTURE, BLOOD (ROUTINE X 2)     Status: Normal   Collection Time   08/13/12  8:40 PM      Component Value Range Status Comment   Specimen Description BLOOD RIGHT HAND   Final    Special Requests BOTTLES DRAWN AEROBIC AND ANAEROBIC 10CC   Final    Culture  Setup Time 08/14/2012 01:08   Final    Culture     Final    Value: STAPHYLOCOCCUS SPECIES (COAGULASE NEGATIVE)     Note: RIFAMPIN AND GENTAMICIN SHOULD NOT BE USED AS SINGLE DRUGS FOR TREATMENT OF STAPH INFECTIONS.     Note: Gram Stain Report Called to,Read Back By and Verified With: KEJI ODEDERE 08/14/12 @ 8:45PM BY RUSCA.   Report Status 08/16/2012 FINAL   Final    Organism ID, Bacteria STAPHYLOCOCCUS SPECIES (COAGULASE NEGATIVE)   Final   CLOSTRIDIUM DIFFICILE BY PCR     Status: Normal   Collection Time   08/14/12 10:03 PM      Component  Value Range Status Comment   C difficile by pcr NEGATIVE  NEGATIVE Final   CULTURE, BLOOD (ROUTINE X 2)     Status: Normal (Preliminary result)   Collection Time   08/16/12 12:20 PM      Component Value Range Status Comment   Specimen Description BLOOD RIGHT ARM   Final    Special Requests BOTTLES DRAWN AEROBIC ONLY 7CC   Final    Culture  Setup Time 08/16/2012 18:29   Final    Culture     Final    Value:        BLOOD CULTURE RECEIVED NO GROWTH TO DATE CULTURE WILL BE HELD FOR 5 DAYS BEFORE ISSUING A FINAL NEGATIVE REPORT   Report Status PENDING   Incomplete   CULTURE, BLOOD (ROUTINE X 2)  Status: Normal (Preliminary result)   Collection Time   08/16/12 12:30 PM      Component Value Range Status Comment   Specimen Description BLOOD RIGHT HAND   Final    Special Requests BOTTLES DRAWN AEROBIC ONLY 3CC   Final    Culture  Setup Time 08/16/2012 18:29   Final    Culture     Final    Value:        BLOOD CULTURE RECEIVED NO GROWTH TO DATE CULTURE WILL BE HELD FOR 5 DAYS BEFORE ISSUING A FINAL NEGATIVE REPORT   Report Status PENDING   Incomplete   MRSA PCR SCREENING     Status: Normal   Collection Time   08/16/12  2:29 PM      Component Value Range Status Comment   MRSA by PCR NEGATIVE  NEGATIVE Final   CATH TIP CULTURE     Status: Normal (Preliminary result)   Collection Time   08/19/12  9:12 AM      Component Value Range Status Comment   Specimen Description CATH TIP   Final    Special Requests DIALYSIS CATH   Final    Culture NO GROWTH 1 DAY   Final    Report Status PENDING   Incomplete     Studies/Results: No results found.  Medications: Scheduled Meds:    . amLODipine  10 mg Oral Daily  . aspirin  81 mg Oral Daily  . calcium acetate  667 mg Oral BID WC  . cloNIDine  0.2 mg Oral BID  . darbepoetin (ARANESP) injection - DIALYSIS  60 mcg Intravenous Q Sat-HD  . insulin aspart  0-15 Units Subcutaneous TID WC  . insulin aspart  0-5 Units Subcutaneous QHS  . insulin  glargine  12 Units Subcutaneous QHS  . isosorbide mononitrate  120 mg Oral Daily  . levothyroxine  25 mcg Oral Q0600  . metoprolol tartrate  25 mg Oral BID  . pantoprazole  40 mg Oral Q1200  . phytonadione (VITAMIN K) IV  5 mg Intravenous Once  . simvastatin  10 mg Oral q1800  . vancomycin  500 mg Intravenous Q M,W,F-HD   Continuous Infusions:  PRN Meds:.acetaminophen, acetaminophen, acetaminophen, HYDROcodone-acetaminophen, HYDROmorphone (DILAUDID) injection, nitroGLYCERIN, ondansetron (ZOFRAN) IV, sodium chloride, DISCONTD: heparin, DISCONTD: heparin, DISCONTD: heparin    Time spent coordinating care:   LOS: 7 days   Lequisha Cammack C [facility] Pager: [phone] 08/20/2012, 1:17 PM

## 2012-08-20 NOTE — Progress Notes (Signed)
[facility] Progress Note  Subjective:  Getting new catheter tomorrow. Has left heel wound of ? Duration.  Objective Filed Vitals:   08/19/12 1732 08/19/12 2146 08/20/12 0455 08/20/12 0953  BP: 130/46 132/58 146/83 165/77  Pulse: 71 78 70 72  Temp: 98.7 F (37.1 C) 97.3 F (36.3 C) 98.3 F (36.8 C) 97.4 F (36.3 C)  TempSrc: Oral Oral Oral Oral  Resp: 17 17 18 20   Height:  5\' 5"  (1.651 m)    Weight:  62.4 kg (137 lb 9.1 oz)    SpO2: 99% 95% 98% 99%   Physical Exam General: alert NAD sitting in chair Heart: RRR Lungs: no wheezes or rales Abdomen: soft Extremities: no significant edema; left medial heel ulcer - ? Stage; dressing in place Dialysis Access: no active access  Dialysis Orders: Center: [facility] on MWF .  EDW 60.5 kg HD Bath 2K/2.5Ca Time 4 hrs Heparin 1200 U. Access Right IJ catheter, AVF @ LUA BFR 400 DFR A1.5  Zemplar 0 mcg IV/HD Epogen 0 Units IV/HD Venofer   Assessment/Plan: 1. Coag. Neg. Staph Bacteremia 08/13/12 BC= Probable perm cath etiology , removed 10/17 On vancomycin Pharmacy Protocol - Perm cath to be replaced Saturday 2. ESRD - MWF ([facility]. ) HD Saturday after cath replaced 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  Anemia - hgb10.5 Start Aranesp Weeklyhd sat , then changed to wed when back on mwf hd schedule 5. Secondary hyperparathyroidism - controlled with phoslo and no zemplar 6. HTN/volume - stable on Lopressor 25mg  bid, imdur 25mg  q day. Clonidine.2mg  bid,amloodipine 10mg  qday,        7.   HO PE = coumadin on hold Tunneled HD cath removedl and reinsert on Saturday- resume post procedure       8.   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   Change to high protein renal diet   Sheffield Slider, PA-C [facility] Beeper [phone]  08/20/2012,10:43 AM  LOS: 7 days    Additional Objective Labs: Lab Results  Component Value Date   INR 0.91 08/19/2012   INR 1.32 08/19/2012   INR 1.99* 08/18/2012     Basic Metabolic Panel:  Lab 08/20/12 6213 08/19/12 1427 08/18/12 0711 08/16/12 0719  NA 136 138 130* --  K 3.6 3.6 3.5 --  CL 94* 95* 94* --  CO2 30 30 27  --  GLUCOSE 163* 108* 183* --  BUN 27* 21 35* --  CREATININE 4.55* 3.50* 4.99* --  CALCIUM 8.9 9.2 8.2* --  ALB -- -- -- --  PHOS -- -- 3.2 4.1   Liver Function Tests:  Lab 08/18/12 0711 08/16/12 0719 08/13/12 2014  AST -- -- 14  ALT -- -- 9  ALKPHOS -- -- 103  BILITOT -- -- 0.2*  PROT -- -- 6.9  ALBUMIN 2.6* 2.9* 3.2*  CBC:  Lab 08/20/12 0540 08/19/12 1427 08/18/12 0711 08/17/12 0520 08/16/12 0719 08/13/12 2014  WBC 9.5 10.1 8.4 -- -- --  NEUTROABS -- -- -- -- -- 12.5*  HGB 10.5* 11.7* 10.1* -- -- --  HCT 32.6* 35.6* 31.3* -- -- --  MCV 87.2 87.3 86.5 88.2 87.8 --  PLT 314 306 230 -- -- --   Blood Culture    Component Value Date/Time   SDES CATH TIP 08/19/2012 0912   SPECREQUEST DIALYSIS CATH 08/19/2012 0912   CULT NO GROWTH 1 DAY 08/19/2012 0912   REPTSTATUS PENDING 08/19/2012 0912  CBG:  Lab 08/20/12 0754 08/19/12 2148 08/19/12 1813 08/19/12 1646 08/19/12 1240  GLUCAP 185* 257* 137* 49* 129*  Medications:      . amLODipine  10 mg Oral Daily  . aspirin  81 mg Oral Daily  . calcium acetate  667 mg Oral BID WC  . cloNIDine  0.2 mg Oral BID  . darbepoetin (ARANESP) injection - DIALYSIS  60 mcg Intravenous Q Sat-HD  . insulin aspart  0-15 Units Subcutaneous TID WC  . insulin aspart  0-5 Units Subcutaneous QHS  . insulin glargine  12 Units Subcutaneous QHS  . isosorbide mononitrate  120 mg Oral Daily  . levothyroxine  25 mcg Oral Q0600  . metoprolol tartrate  25 mg Oral BID  . pantoprazole  40 mg Oral Q1200  . phytonadione (VITAMIN K) IV  5 mg Intravenous Once  . simvastatin  10 mg Oral q1800  . vancomycin  500 mg Intravenous Q M,W,F-HD

## 2012-08-20 NOTE — Progress Notes (Signed)
[facility]  Daily Progress Note  Assessment/Planning: POD #1 s/p L arm angiogram, ligation of L BVT, removal TDC   Cath tip cx pending  Left hand sx resolving  I discussed with the patient that the only remaining option for permanent access was a thigh AVG but based on what I saw in her left common femoral artery, I have significant doubts about that option.  I am going to let her heal up and follow up with me in two weeks, during which we start the formal process to determine if she is a thigh AVG candidate.  TDC placement tomorrow  Subjective  - 1 Day Post-Op  No complaints  Objective Filed Vitals:   08/19/12 1236 08/19/12 1732 08/19/12 2146 08/20/12 0455  BP: 151/49 130/46 132/58 146/83  Pulse: 71 71 78 70  Temp: 97.8 F (36.6 C) 98.7 F (37.1 C) 97.3 F (36.3 C) 98.3 F (36.8 C)  TempSrc: Oral Oral Oral Oral  Resp: 15 17 17 18  Height:   5' 5" (1.651 m)   Weight:   137 lb 9.1 oz (62.4 kg)   SpO2: 96% 99% 95% 98%    Intake/Output Summary (Last 24 hours) at 08/20/12 0947 Last data filed at 08/19/12 1700  Gross per 24 hour  Intake    300 ml  Output    250 ml  Net     50 ml    PULM  CTAB CV  RRR GI  soft, NTND VASC  L hand warmer, sensation improved, hand grip 5/5 (unchanged)  Laboratory CBC    Component Value Date/Time   WBC 9.5 08/20/2012 0540   HGB 10.5* 08/20/2012 0540   HCT 32.6* 08/20/2012 0540   PLT 314 08/20/2012 0540    BMET    Component Value Date/Time   NA 136 08/20/2012 0540   K 3.6 08/20/2012 0540   CL 94* 08/20/2012 0540   CO2 30 08/20/2012 0540   GLUCOSE 163* 08/20/2012 0540   BUN 27* 08/20/2012 0540   CREATININE 4.55* 08/20/2012 0540   CALCIUM 8.9 08/20/2012 0540   GFRNONAA 9* 08/20/2012 0540   GFRAA 10* 08/20/2012 0540    Dhani Imel, MD [facility] Office: [phone] Pager: [phone]  08/20/2012, 9:47 AM     

## 2012-08-20 NOTE — Progress Notes (Signed)
Utilization review completed.  

## 2012-08-20 NOTE — Progress Notes (Signed)
I have seen and examined this patient and agree with the plan of care . Usual dialysis day is today but patient will need new permcath due to bacteremia. We will dialyze her tomorrow. [name] [name] W 08/20/2012, 2:01 PM

## 2012-08-20 NOTE — Progress Notes (Signed)
ANTIBIOTIC CONSULT NOTE - FOLLOW UP  Pharmacy Consult for Vancomycin Indication: CoNS Bacteremia  Allergies  Allergen Reactions  . Procaine Hcl Nausea And Vomiting and Other (See Comments)    "Pass out"    Patient Measurements: Height: 5\' 5"  (165.1 cm) Weight: 137 lb 9.1 oz (62.4 kg) IBW/kg (Calculated) : 57   Vital Signs: Temp: 97.4 F (36.3 C) (10/18 0953) Temp src: Oral (10/18 0953) BP: 165/77 mmHg (10/18 0953) Pulse Rate: 72  (10/18 0953) Intake/Output from previous day: 10/17 0701 - 10/18 0700 In: 300 [I.V.:300] Out: 250 [Urine:250] Intake/Output from this shift: Total I/O In: 240 [P.O.:240] Out: -   Labs:  Basename 08/20/12 0540 08/19/12 1427 08/18/12 0711  WBC 9.5 10.1 8.4  HGB 10.5* 11.7* 10.1*  PLT 314 306 230  LABCREA -- -- --  CREATININE 4.55* 3.50* 4.99*   Estimated Creatinine Clearance: 10.5 ml/min (by C-G formula based on Cr of 4.55).  Basename 08/20/12 0540  VANCOTROUGH --  Leodis Binet --  VANCORANDOM 15.2  GENTTROUGH --  GENTPEAK --  GENTRANDOM --  TOBRATROUGH --  TOBRAPEAK --  TOBRARND --  AMIKACINPEAK --  AMIKACINTROU --  AMIKACIN --     Microbiology: Recent Results (from the past 720 hour(s))  CULTURE, BLOOD (ROUTINE X 2)     Status: Normal   Collection Time   08/13/12  8:30 PM      Component Value Range Status Comment   Specimen Description BLOOD ARM RIGHT   Final    Special Requests BOTTLES DRAWN AEROBIC AND ANAEROBIC 10CC   Final    Culture  Setup Time 08/14/2012 01:08   Final    Culture     Final    Value: STAPHYLOCOCCUS SPECIES (COAGULASE NEGATIVE)     Note: SUSCEPTIBILITIES PERFORMED ON PREVIOUS CULTURE WITHIN THE LAST 5 DAYS.     Note: Gram Stain Report Called to,Read Back By and Verified With: Minna Merritts RN on 08/15/12 at 01:25 by Christie Nottingham   Report Status 08/16/2012 FINAL   Final   CULTURE, BLOOD (ROUTINE X 2)     Status: Normal   Collection Time   08/13/12  8:40 PM      Component Value Range Status Comment   Specimen Description BLOOD RIGHT HAND   Final    Special Requests BOTTLES DRAWN AEROBIC AND ANAEROBIC 10CC   Final    Culture  Setup Time 08/14/2012 01:08   Final    Culture     Final    Value: STAPHYLOCOCCUS SPECIES (COAGULASE NEGATIVE)     Note: RIFAMPIN AND GENTAMICIN SHOULD NOT BE USED AS SINGLE DRUGS FOR TREATMENT OF STAPH INFECTIONS.     Note: Gram Stain Report Called to,Read Back By and Verified With: KEJI ODEDERE 08/14/12 @ 8:45PM BY RUSCA.   Report Status 08/16/2012 FINAL   Final    Organism ID, Bacteria STAPHYLOCOCCUS SPECIES (COAGULASE NEGATIVE)   Final   CLOSTRIDIUM DIFFICILE BY PCR     Status: Normal   Collection Time   08/14/12 10:03 PM      Component Value Range Status Comment   C difficile by pcr NEGATIVE  NEGATIVE Final   CULTURE, BLOOD (ROUTINE X 2)     Status: Normal (Preliminary result)   Collection Time   08/16/12 12:20 PM      Component Value Range Status Comment   Specimen Description BLOOD RIGHT ARM   Final    Special Requests BOTTLES DRAWN AEROBIC ONLY [facility]   Final    Culture  Setup  Time 08/16/2012 18:29   Final    Culture     Final    Value:        BLOOD CULTURE RECEIVED NO GROWTH TO DATE CULTURE WILL BE HELD FOR 5 DAYS BEFORE ISSUING A FINAL NEGATIVE REPORT   Report Status PENDING   Incomplete   CULTURE, BLOOD (ROUTINE X 2)     Status: Normal (Preliminary result)   Collection Time   08/16/12 12:30 PM      Component Value Range Status Comment   Specimen Description BLOOD RIGHT HAND   Final    Special Requests BOTTLES DRAWN AEROBIC ONLY 3CC   Final    Culture  Setup Time 08/16/2012 18:29   Final    Culture     Final    Value:        BLOOD CULTURE RECEIVED NO GROWTH TO DATE CULTURE WILL BE HELD FOR 5 DAYS BEFORE ISSUING A FINAL NEGATIVE REPORT   Report Status PENDING   Incomplete   MRSA PCR SCREENING     Status: Normal   Collection Time   08/16/12  2:29 PM      Component Value Range Status Comment   MRSA by PCR NEGATIVE  NEGATIVE Final   CATH TIP CULTURE      Status: Normal (Preliminary result)   Collection Time   08/19/12  9:12 AM      Component Value Range Status Comment   Specimen Description CATH TIP   Final    Special Requests DIALYSIS CATH   Final    Culture NO GROWTH 1 DAY   Final    Report Status PENDING   Incomplete     Anti-infectives     Start     Dose/Rate Route Frequency Ordered Stop   08/16/12 1200   vancomycin (VANCOCIN) 750 mg in sodium chloride 0.9 % 150 mL IVPB  Status:  Discontinued        750 mg 150 mL/hr over 60 Minutes Intravenous Every M-W-F (Hemodialysis) 08/14/12 0011 08/14/12 1401   08/16/12 1200   vancomycin (VANCOCIN) 750 mg in sodium chloride 0.9 % 150 mL IVPB  Status:  Discontinued        750 mg 150 mL/hr over 60 Minutes Intravenous Every M-W-F (Hemodialysis) 08/15/12 0941 08/15/12 1314   08/16/12 1200   vancomycin (VANCOCIN) 500 mg in sodium chloride 0.9 % 100 mL IVPB        500 mg 100 mL/hr over 60 Minutes Intravenous Every M-W-F (Hemodialysis) 08/15/12 1314     08/15/12 1030   vancomycin (VANCOCIN) 500 mg in sodium chloride 0.9 % 100 mL IVPB        500 mg 100 mL/hr over 60 Minutes Intravenous  Once 08/15/12 0953 08/15/12 1125   08/14/12 0015   vancomycin (VANCOCIN) 500 mg in sodium chloride 0.9 % 100 mL IVPB        500 mg 100 mL/hr over 60 Minutes Intravenous  Once 08/14/12 0011 08/14/12 0241   08/13/12 2030   vancomycin (VANCOCIN) IVPB 1000 mg/200 mL premix        1,000 mg 200 mL/hr over 60 Minutes Intravenous  Once 08/13/12 2015 08/13/12 2347   08/13/12 2030  piperacillin-tazobactam (ZOSYN) IVPB 3.375 g       3.375 g 12.5 mL/hr over 240 Minutes Intravenous  Once 08/13/12 2015 08/14/12 0741          Assessment: 71 y.o. F with ESRD on Vancomycin for CoNS bacteremia thought to be related to RIJ tunneled  dialysis catheter, now s/p removal on 10/17. No HD planned for today as patient has no current access -- to get new temp cath placed on 10/19 and receive dialysis at that time. Random  Vancomycin level today was therapeutic (Vanc level~15.2 mcg/ml, goal of 15-25 mcg/ml).  Of noting, the patient's warfarin (for hx PE) remains on hold while awaiting new temp cath placement on 10/19. Plans were discussed with [facility] to continue holding warfarin until this has been placed -- will f/u with them plans to resume and any bridging plans tomorrow.  Goal of Therapy:  Pre-HD Vancomycin level of 15-25 mcg/ml  Plan:  1. Vancomycin 750 mg IV x 1 post HD session on Sat, 10/19 (off normal schedule) 2. Continue Vancomycin 750 mg post HD sessions on M/W/F (starting on Mon, 10/21) 3. Will f/u plans to resume warfarin on 10/19 4. Will continue to follow HD schedule/duration, culture results, LOT, and antibiotic de-escalation plans   Georgina Pillion, PharmD, BCPS Clinical Pharmacist Pager: [phone] 08/20/2012 2:49 PM

## 2012-08-20 NOTE — Consult Note (Addendum)
WOC consult Note Reason for Consult: Consult requested for left heel wound.  Pt states she has had this "for awhile at home and it cracked and turned into an open wound". Wound type:Full thickness Pressure Ulcer POA: This is not a pressure ulcer.  Appearance is consistent with a previously calloused area which has evolved into an open wound. Measurement:.5X.5X.2cm Wound ZOX:WRUEA wound bed red and moist Drainage (amount, consistency, odor) mod yellow drainage, no odor. Periwound: surrounded by white macerated skin Dressing procedure/placement/frequency: Foam dressing to protect and promote healing. Will not plan to follow further unless re-consulted.  [address], RN, MSN, [employer]  [phone]

## 2012-08-21 ENCOUNTER — Inpatient Hospital Stay (HOSPITAL_COMMUNITY): Payer: Medicare Other | Admitting: Anesthesiology

## 2012-08-21 ENCOUNTER — Encounter (HOSPITAL_COMMUNITY): Payer: Self-pay | Admitting: Anesthesiology

## 2012-08-21 ENCOUNTER — Inpatient Hospital Stay (HOSPITAL_COMMUNITY): Payer: Medicare Other

## 2012-08-21 ENCOUNTER — Encounter (HOSPITAL_COMMUNITY)
Admission: EM | Disposition: A | Discharge status: Home / Self Care | Payer: Self-pay | Source: Home / Self Care | Attending: Internal Medicine

## 2012-08-21 HISTORY — PX: INSERTION OF DIALYSIS CATHETER: SHX1324

## 2012-08-21 LAB — CBC
HCT: 30.2 % — ABNORMAL LOW (ref 36.0–46.0)
Hemoglobin: 10.1 g/dL — ABNORMAL LOW (ref 12.0–15.0)
MCH: 28.9 pg (ref 26.0–34.0)
MCHC: 33.4 g/dL (ref 30.0–36.0)
MCV: 86.5 fL (ref 78.0–100.0)
Platelets: 322 K/uL (ref 150–400)
RBC: 3.49 MIL/uL — ABNORMAL LOW (ref 3.87–5.11)
RDW: 13.8 % (ref 11.5–15.5)
WBC: 9.4 K/uL (ref 4.0–10.5)

## 2012-08-21 LAB — RENAL FUNCTION PANEL
Albumin: 2.8 g/dL — ABNORMAL LOW (ref 3.5–5.2)
BUN: 41 mg/dL — ABNORMAL HIGH (ref 6–23)
CO2: 28 meq/L (ref 19–32)
Calcium: 8.7 mg/dL (ref 8.4–10.5)
Chloride: 88 meq/L — ABNORMAL LOW (ref 96–112)
Creatinine, Ser: 5.05 mg/dL — ABNORMAL HIGH (ref 0.50–1.10)
GFR calc Af Amer: 9 mL/min — ABNORMAL LOW (ref >90)
GFR calc non Af Amer: 8 mL/min — ABNORMAL LOW (ref >90)
Glucose, Bld: 220 mg/dL — ABNORMAL HIGH (ref 70–99)
Phosphorus: 3.5 mg/dL (ref 2.3–4.6)
Potassium: 4.3 meq/L (ref 3.5–5.1)
Sodium: 128 meq/L — ABNORMAL LOW (ref 135–145)

## 2012-08-21 LAB — GLUCOSE, CAPILLARY
Glucose-Capillary: 251 mg/dL — ABNORMAL HIGH (ref 70–99)
Glucose-Capillary: 220 mg/dL — ABNORMAL HIGH (ref 70–99)

## 2012-08-21 LAB — PROTIME-INR
INR: 0.91 (ref 0.00–1.49)
Prothrombin Time: 12.2 s (ref 11.6–15.2)

## 2012-08-21 SURGERY — INSERTION OF DIALYSIS CATHETER
Anesthesia: Monitor Anesthesia Care | Site: Chest | Laterality: Left | Wound class: Clean

## 2012-08-21 MED ORDER — WARFARIN - PHARMACIST DOSING INPATIENT: Freq: Every day | Status: DC

## 2012-08-21 MED ORDER — LIDOCAINE HCL (PF) 1 % IJ SOLN
INTRAMUSCULAR | Status: DC | PRN
  Administered 2012-08-21: 5 mL via INTRADERMAL

## 2012-08-21 MED ORDER — OXYCODONE HCL 5 MG/5ML PO SOLN: 5.0000 mg | Freq: Once | ORAL | Status: AC | PRN

## 2012-08-21 MED ORDER — LIDOCAINE HCL (PF) 1 % IJ SOLN
INTRAMUSCULAR | Status: AC
  Filled 2012-08-21: qty 30

## 2012-08-21 MED ORDER — SODIUM CHLORIDE 0.9 % IR SOLN
Status: DC | PRN
  Administered 2012-08-21: 09:00:00

## 2012-08-21 MED ORDER — WARFARIN SODIUM 3 MG PO TABS
3.0000 mg | ORAL_TABLET | Freq: Once | ORAL | Status: AC
  Administered 2012-08-21: 3 mg via ORAL
  Filled 2012-08-21: qty 1

## 2012-08-21 MED ORDER — METOCLOPRAMIDE HCL 5 MG/ML IJ SOLN: 10.0000 mg | Freq: Once | INTRAMUSCULAR | Status: AC | PRN

## 2012-08-21 MED ORDER — DARBEPOETIN ALFA-POLYSORBATE 60 MCG/0.3ML IJ SOLN
INTRAMUSCULAR | Status: AC
  Administered 2012-08-21: 60 ug via INTRAVENOUS
  Filled 2012-08-21: qty 0.3

## 2012-08-21 MED ORDER — HEPARIN SODIUM (PORCINE) 1000 UNIT/ML IJ SOLN
INTRAMUSCULAR | Status: AC
  Filled 2012-08-21: qty 1

## 2012-08-21 MED ORDER — SODIUM CHLORIDE 0.9 % IV SOLN
INTRAVENOUS | Status: DC | PRN
  Administered 2012-08-21: 07:00:00 via INTRAVENOUS

## 2012-08-21 MED ORDER — PHENYLEPHRINE HCL 10 MG/ML IJ SOLN
10.0000 mg | INTRAMUSCULAR | Status: DC | PRN
  Administered 2012-08-21: 50 ug/min via INTRAVENOUS

## 2012-08-21 MED ORDER — EPHEDRINE SULFATE 50 MG/ML IJ SOLN
INTRAMUSCULAR | Status: DC | PRN
  Administered 2012-08-21: 10 mg via INTRAVENOUS
  Administered 2012-08-21: 15 mg via INTRAVENOUS

## 2012-08-21 MED ORDER — FENTANYL CITRATE 0.05 MG/ML IJ SOLN: 25.0000 ug | INTRAMUSCULAR | Status: DC | PRN

## 2012-08-21 MED ORDER — OXYCODONE HCL 5 MG PO TABS: 5.0000 mg | ORAL_TABLET | Freq: Once | ORAL | Status: AC | PRN

## 2012-08-21 MED ORDER — HYDROCODONE-ACETAMINOPHEN 5-325 MG PO TABS
ORAL_TABLET | ORAL | Status: AC
  Administered 2012-08-21: 2 via ORAL
  Filled 2012-08-21: qty 2

## 2012-08-21 MED ORDER — PROPOFOL INFUSION 10 MG/ML OPTIME
INTRAVENOUS | Status: DC | PRN
  Administered 2012-08-21: 100 ug/kg/min via INTRAVENOUS

## 2012-08-21 MED ORDER — FENTANYL CITRATE 0.05 MG/ML IJ SOLN
INTRAMUSCULAR | Status: DC | PRN
  Administered 2012-08-21: 100 ug via INTRAVENOUS

## 2012-08-21 MED ORDER — 0.9 % SODIUM CHLORIDE (POUR BTL) OPTIME
TOPICAL | Status: DC | PRN
  Administered 2012-08-21: 1000 mL

## 2012-08-21 SURGICAL SUPPLY — 37 items
BAG DECANTER FOR FLEXI CONT (MISCELLANEOUS) ×2 IMPLANT
CATH CANNON HEMO 15F 50CM (CATHETERS) IMPLANT
CATH CANNON HEMO 15FR 19 (HEMODIALYSIS SUPPLIES) ×2 IMPLANT
CATH CANNON HEMO 15FR 23CM (HEMODIALYSIS SUPPLIES) IMPLANT
CATH CANNON HEMO 15FR 31CM (HEMODIALYSIS SUPPLIES) IMPLANT
CATH CANNON HEMO 15FR 32CM (HEMODIALYSIS SUPPLIES) IMPLANT
CHLORAPREP W/TINT 26ML (MISCELLANEOUS) ×2 IMPLANT
CLOTH BEACON ORANGE TIMEOUT ST (SAFETY) ×2 IMPLANT
COVER PROBE W GEL 5X96 (DRAPES) IMPLANT
COVER SURGICAL LIGHT HANDLE (MISCELLANEOUS) ×2 IMPLANT
DECANTER SPIKE VIAL GLASS SM (MISCELLANEOUS) ×2 IMPLANT
DRAPE C-ARM 42X72 X-RAY (DRAPES) ×2 IMPLANT
DRAPE CHEST BREAST 15X10 FENES (DRAPES) ×2 IMPLANT
GAUZE SPONGE 2X2 8PLY STRL LF (GAUZE/BANDAGES/DRESSINGS) ×1 IMPLANT
GAUZE SPONGE 4X4 16PLY XRAY LF (GAUZE/BANDAGES/DRESSINGS) ×2 IMPLANT
GLOVE BIO SURGEON STRL SZ7.5 (GLOVE) ×2 IMPLANT
GOWN PREVENTION PLUS XLARGE (GOWN DISPOSABLE) ×2 IMPLANT
GOWN STRL NON-REIN LRG LVL3 (GOWN DISPOSABLE) ×4 IMPLANT
KIT BASIN OR (CUSTOM PROCEDURE TRAY) ×2 IMPLANT
KIT ROOM TURNOVER OR (KITS) ×2 IMPLANT
NEEDLE 18GX1X1/2 (RX/OR ONLY) (NEEDLE) ×2 IMPLANT
NEEDLE HYPO 25GX1X1/2 BEV (NEEDLE) ×2 IMPLANT
NS IRRIG 1000ML POUR BTL (IV SOLUTION) ×2 IMPLANT
PACK SURGICAL SETUP 50X90 (CUSTOM PROCEDURE TRAY) ×2 IMPLANT
PAD ARMBOARD 7.5X6 YLW CONV (MISCELLANEOUS) ×4 IMPLANT
SPONGE GAUZE 2X2 STER 10/PKG (GAUZE/BANDAGES/DRESSINGS) ×1
SUT ETHILON 3 0 PS 1 (SUTURE) ×2 IMPLANT
SUT VICRYL 4-0 PS2 18IN ABS (SUTURE) ×2 IMPLANT
SYR 20CC LL (SYRINGE) ×4 IMPLANT
SYR 30ML LL (SYRINGE) IMPLANT
SYR 5ML LL (SYRINGE) ×4 IMPLANT
SYR CONTROL 10ML LL (SYRINGE) ×2 IMPLANT
SYRINGE 10CC LL (SYRINGE) ×2 IMPLANT
TAPE CLOTH SURG 4X10 WHT LF (GAUZE/BANDAGES/DRESSINGS) ×2 IMPLANT
TOWEL OR 17X24 6PK STRL BLUE (TOWEL DISPOSABLE) ×2 IMPLANT
TOWEL OR 17X26 10 PK STRL BLUE (TOWEL DISPOSABLE) ×2 IMPLANT
WATER STERILE IRR 1000ML POUR (IV SOLUTION) ×2 IMPLANT

## 2012-08-21 NOTE — Anesthesia Preprocedure Evaluation (Addendum)
Anesthesia Evaluation  Patient identified by MRN, date of birth, ID band Patient awake    Reviewed: Allergy & Precautions, H&P , NPO status , Patient's Chart, lab work & pertinent test results, reviewed documented beta blocker date and time   Airway Mallampati: II TM Distance: >3 FB Neck ROM: full    Dental  (+) Edentulous Upper, Edentulous Lower and Dental Advisory Given   Pulmonary shortness of breath and with exertion, pneumonia -, resolved, COPD COPD inhaler and oxygen dependent,  breath sounds clear to auscultation        Cardiovascular hypertension, On Medications + angina + CAD, + Past MI and +CHF Rhythm:regular     Neuro/Psych negative neurological ROS  negative psych ROS   GI/Hepatic Neg liver ROS, GERD-  Medicated,  Endo/Other  negative endocrine ROSdiabetes, Insulin DependentHypothyroidism   Renal/GU ESRFRenal disease  negative genitourinary   Musculoskeletal   Abdominal   Peds  Hematology negative hematology ROS (+)   Anesthesia Other Findings See surgeon's H&P   Reproductive/Obstetrics negative OB ROS                          Anesthesia Physical Anesthesia Plan  ASA: IV  Anesthesia Plan: MAC   Post-op Pain Management:    Induction: Intravenous  Airway Management Planned: Simple Face Mask  Additional Equipment:   Intra-op Plan:   Post-operative Plan:   Informed Consent: I have reviewed the patients History and Physical, chart, labs and discussed the procedure including the risks, benefits and alternatives for the proposed anesthesia with the patient or authorized representative who has indicated his/her understanding and acceptance.   Dental Advisory Given  Plan Discussed with: CRNA and Surgeon  Anesthesia Plan Comments:         Anesthesia Quick Evaluation

## 2012-08-21 NOTE — Progress Notes (Signed)
[facility]             Progress Note  Brief summary: [name] [name] is a 71/F with DM, COPD, PE on coumadin, ESRD on HD via HD catheter and recent AVF, presented to [facility] with N/V/D and fever subsequently found to have Coag negative staph bacteremia, stable on Vanc, plan for HD cath removal  Assessment/Plan:  1. Coagulase negative staph bacteremia 2/2 from 10/11 Sensitive to Vanc Source suspected to be HD Catheter related with seeding during recent AVF placement Continue Vancomycin,  -s/p HD catheter removal per VVS today 10/17 (INR <1.7; O.91) -repeat blood cultures 10/14 with no growth to date -appreciate VVS and renal assistance. -s/p new dialysis catheter placement today 10/19 a.m. per VVS  2. N/V/Diarrhea due to 1, resolved cdiff PCR negative  3. ESRD on HD MWF per renal  4. H/o PE on coumadin -will resume coumadin today per pharmacy  5. COPD/Chronic resp failure on 2-3L home O2, stable  6. DM: CBGs stable, improved, continue lantus, SSI  7. Anemia of chronic disease; stable, Hb 10.7-11.4, yesterday Hb of 13.8 likely lab error  8. HTN: stable, continue Amlodipine, clonidine, imdur, metoprolol  9.Hyponatremia: 2/2 to fluid shifts in this dialysis pt, follow and recheck     Subjective: Patient seen at dialysis, s/p new dialysis cath earlier this am. she denies any complaints. Objective: Vital signs in last 24 hours: Temp:  [97.6 F (36.4 C)-98 F (36.7 C)] 97.6 F (36.4 C) (10/19 1040) Pulse Rate:  [71-104] 91  (10/19 1040) Resp:  [15-20] 18  (10/19 1040) BP: (116-188)/(58-82) 188/80 mmHg (10/19 1040) SpO2:  [90 %-99 %] 92 % (10/19 1040) Weight:  [62.4 kg (137 lb 9.1 oz)] 62.4 kg (137 lb 9.1 oz) (10/18 2038) Weight change: 0 kg (0 lb) Last BM Date: 08/15/12  Intake/Output from previous day: 10/18 0701 - 10/19 0700 In: 1080 [P.O.:1080] Out: -  Total I/O In: 350 [I.V.:350] Out: 155 [Urine:125; Blood:30]   Physical Exam: General: Alert, awake,  oriented x3, in no acute distress. HEENT: No bruits, no goiter.  Heart: Regular rate and rhythm, without murmurs, rubs, gallops. Lungs: Clear to auscultation bilaterally. Abdomen: Soft, nontender, nondistended, positive bowel sounds. Extremities: No clubbing cyanosis or edema with positive pedal pulses Neuro: Grossly intact, nonfocal.    Lab Results: Basic Metabolic Panel:  Basename 08/21/12 0540 08/20/12 0540  NA 128* 136  K 4.3 3.6  CL 88* 94*  CO2 28 30  GLUCOSE 220* 163*  BUN 41* 27*  CREATININE 5.05* 4.55*  CALCIUM 8.7 8.9  MG -- --  PHOS 3.5 --   Liver Function Tests:  Basename 08/21/12 0540  AST --  ALT --  ALKPHOS --  BILITOT --  PROT --  ALBUMIN 2.8*   No results found for this basename: LIPASE:2,AMYLASE:2 in the last 72 hours No results found for this basename: AMMONIA:2 in the last 72 hours CBC:  Basename 08/21/12 0540 08/20/12 0540  WBC 9.4 9.5  NEUTROABS -- --  HGB 10.1* 10.5*  HCT 30.2* 32.6*  MCV 86.5 87.2  PLT 322 314   Cardiac Enzymes: No results found for this basename: CKTOTAL:3,CKMB:3,CKMBINDEX:3,TROPONINI:3 in the last 72 hours BNP: No results found for this basename: PROBNP:3 in the last 72 hours D-Dimer: No results found for this basename: DDIMER:2 in the last 72 hours CBG:  Basename 08/21/12 1041 08/21/12 0922 08/20/12 2041 08/20/12 1643 08/20/12 1126 08/20/12 0754  GLUCAP 251* 229* 219* 318* 242* 185*   Hemoglobin A1C: No results  found for this basename: HGBA1C in the last 72 hours Fasting Lipid Panel: No results found for this basename: CHOL,HDL,LDLCALC,TRIG,CHOLHDL,LDLDIRECT in the last 72 hours Thyroid Function Tests: No results found for this basename: TSH,T4TOTAL,FREET4,T3FREE,THYROIDAB in the last 72 hours Anemia Panel: No results found for this basename: VITAMINB12,FOLATE,FERRITIN,TIBC,IRON,RETICCTPCT in the last 72 hours Coagulation:  Basename 08/21/12 0540 08/19/12 1427  LABPROT 12.2 12.2  INR 0.91 0.91   Urine  Drug Screen: Drugs of Abuse  No results found for this basename: labopia,  cocainscrnur,  labbenz,  amphetmu,  thcu,  labbarb    Alcohol Level: No results found for this basename: ETH:2 in the last 72 hours Urinalysis: No results found for this basename: COLORURINE:2,APPERANCEUR:2,LABSPEC:2,PHURINE:2,GLUCOSEU:2,HGBUR:2,BILIRUBINUR:2,KETONESUR:2,PROTEINUR:2,UROBILINOGEN:2,NITRITE:2,LEUKOCYTESUR:2 in the last 72 hours  Recent Results (from the past 240 hour(s))  CULTURE, BLOOD (ROUTINE X 2)     Status: Normal   Collection Time   08/13/12  8:30 PM      Component Value Range Status Comment   Specimen Description BLOOD ARM RIGHT   Final    Special Requests BOTTLES DRAWN AEROBIC AND ANAEROBIC 10CC   Final    Culture  Setup Time 08/14/2012 01:08   Final    Culture     Final    Value: STAPHYLOCOCCUS SPECIES (COAGULASE NEGATIVE)     Note: SUSCEPTIBILITIES PERFORMED ON PREVIOUS CULTURE WITHIN THE LAST 5 DAYS.     Note: Gram Stain Report Called to,Read Back By and Verified With: Minna Merritts RN on 08/15/12 at 01:25 by Christie Nottingham   Report Status 08/16/2012 FINAL   Final   CULTURE, BLOOD (ROUTINE X 2)     Status: Normal   Collection Time   08/13/12  8:40 PM      Component Value Range Status Comment   Specimen Description BLOOD RIGHT HAND   Final    Special Requests BOTTLES DRAWN AEROBIC AND ANAEROBIC 10CC   Final    Culture  Setup Time 08/14/2012 01:08   Final    Culture     Final    Value: STAPHYLOCOCCUS SPECIES (COAGULASE NEGATIVE)     Note: RIFAMPIN AND GENTAMICIN SHOULD NOT BE USED AS SINGLE DRUGS FOR TREATMENT OF STAPH INFECTIONS.     Note: Gram Stain Report Called to,Read Back By and Verified With: KEJI ODEDERE 08/14/12 @ 8:45PM BY RUSCA.   Report Status 08/16/2012 FINAL   Final    Organism ID, Bacteria STAPHYLOCOCCUS SPECIES (COAGULASE NEGATIVE)   Final   CLOSTRIDIUM DIFFICILE BY PCR     Status: Normal   Collection Time   08/14/12 10:03 PM      Component Value Range Status Comment    C difficile by pcr NEGATIVE  NEGATIVE Final   CULTURE, BLOOD (ROUTINE X 2)     Status: Normal (Preliminary result)   Collection Time   08/16/12 12:20 PM      Component Value Range Status Comment   Specimen Description BLOOD RIGHT ARM   Final    Special Requests BOTTLES DRAWN AEROBIC ONLY 7CC   Final    Culture  Setup Time 08/16/2012 18:29   Final    Culture     Final    Value:        BLOOD CULTURE RECEIVED NO GROWTH TO DATE CULTURE WILL BE HELD FOR 5 DAYS BEFORE ISSUING A FINAL NEGATIVE REPORT   Report Status PENDING   Incomplete   CULTURE, BLOOD (ROUTINE X 2)     Status: Normal (Preliminary result)   Collection Time   08/16/12 12:30  PM      Component Value Range Status Comment   Specimen Description BLOOD RIGHT HAND   Final    Special Requests BOTTLES DRAWN AEROBIC ONLY 3CC   Final    Culture  Setup Time 08/16/2012 18:29   Final    Culture     Final    Value:        BLOOD CULTURE RECEIVED NO GROWTH TO DATE CULTURE WILL BE HELD FOR 5 DAYS BEFORE ISSUING A FINAL NEGATIVE REPORT   Report Status PENDING   Incomplete   MRSA PCR SCREENING     Status: Normal   Collection Time   08/16/12  2:29 PM      Component Value Range Status Comment   MRSA by PCR NEGATIVE  NEGATIVE Final   CATH TIP CULTURE     Status: Normal (Preliminary result)   Collection Time   08/19/12  9:12 AM      Component Value Range Status Comment   Specimen Description CATH TIP   Final    Special Requests DIALYSIS CATH   Final    Culture NO GROWTH 2 DAYS   Final    Report Status PENDING   Incomplete     Studies/Results: Dg Chest Port 1 View  08/21/2012  *RADIOLOGY REPORT*  Clinical Data: Central line insertion.  Diabetes.  PORTABLE CHEST - 1 VIEW  Comparison: 08/13/2012  Findings: Left IJ dialysis catheter noted with distal tip at the cavoatrial junction.  No pneumothorax.  Stable faint interstitial accentuation noted.  Atherosclerotic calcification of the aortic arch is present.  Heart size within normal limits for  technique. Prior right IJ line has been removed.  IMPRESSION:  1.  Replacement of the prior right IJ line with a left internal jugular dialysis catheter.  Distal tip at cavoatrial junction; no pneumothorax.   Original Report Authenticated By: Dellia Cloud, M.D.     Medications: Scheduled Meds:    . amLODipine  10 mg Oral Daily  . aspirin  81 mg Oral Daily  . calcium acetate  667 mg Oral BID WC  . cloNIDine  0.2 mg Oral BID  . darbepoetin (ARANESP) injection - DIALYSIS  60 mcg Intravenous Q Sat-HD  . insulin aspart  0-15 Units Subcutaneous TID WC  . insulin aspart  0-5 Units Subcutaneous QHS  . insulin glargine  12 Units Subcutaneous QHS  . isosorbide mononitrate  120 mg Oral Daily  . levothyroxine  25 mcg Oral Q0600  . metoprolol tartrate  25 mg Oral BID  . pantoprazole  40 mg Oral Q1200  . phytonadione (VITAMIN K) IV  5 mg Intravenous Once  . simvastatin  10 mg Oral q1800  . vancomycin  750 mg Intravenous Q M,W,F-HD  . vancomycin  750 mg Intravenous Q Sat-HD  . DISCONTD: vancomycin  500 mg Intravenous Q M,W,F-HD   Continuous Infusions:  PRN Meds:.acetaminophen, acetaminophen, fentaNYL, HYDROcodone-acetaminophen, metoCLOPramide, nitroGLYCERIN, oxyCODONE, oxyCODONE, sodium chloride, DISCONTD: 0.9 % irrigation (POUR BTL), DISCONTD: heparin 6000 unit irrigation, DISCONTD:  HYDROmorphone (DILAUDID) injection, DISCONTD: lidocaine    Time spent coordinating care:   LOS: 8 days   Tiki Tucciarone C [facility] Pager: [phone] 08/21/2012, 1:32 PM

## 2012-08-21 NOTE — H&P (View-Only) (Signed)
[facility] [place]  Daily Progress Note  Assessment/Planning: POD #1 s/p L arm angiogram, ligation of L BVT, removal TDC   Cath tip cx pending  Left hand sx resolving  I discussed with the patient that the only remaining option for permanent access was a thigh AVG but based on what I saw in her left common femoral artery, I have significant doubts about that option.  I am going to let her heal up and follow up with me in two weeks, during which we start the formal process to determine if she is a thigh AVG candidate.  TDC placement tomorrow  Subjective  - 1 Day Post-Op  No complaints  Objective Filed Vitals:   08/19/12 1236 08/19/12 1732 08/19/12 2146 08/20/12 0455  BP: 151/49 130/46 132/58 146/83  Pulse: 71 71 78 70  Temp: 97.8 F (36.6 C) 98.7 F (37.1 C) 97.3 F (36.3 C) 98.3 F (36.8 C)  TempSrc: Oral Oral Oral Oral  Resp: 15 17 17 18   Height:   5\' 5"  (1.651 m)   Weight:   137 lb 9.1 oz (62.4 kg)   SpO2: 96% 99% 95% 98%    Intake/Output Summary (Last 24 hours) at 08/20/12 0947 Last data filed at 08/19/12 1700  Gross per 24 hour  Intake    300 ml  Output    250 ml  Net     50 ml    PULM  CTAB CV  RRR GI  soft, NTND VASC  L hand warmer, sensation improved, hand grip 5/5 (unchanged)  Laboratory CBC    Component Value Date/Time   WBC 9.5 08/20/2012 0540   HGB 10.5* 08/20/2012 0540   HCT 32.6* 08/20/2012 0540   PLT 314 08/20/2012 0540    BMET    Component Value Date/Time   NA 136 08/20/2012 0540   K 3.6 08/20/2012 0540   CL 94* 08/20/2012 0540   CO2 30 08/20/2012 0540   GLUCOSE 163* 08/20/2012 0540   BUN 27* 08/20/2012 0540   CREATININE 4.55* 08/20/2012 0540   CALCIUM 8.9 08/20/2012 0540   GFRNONAA 9* 08/20/2012 0540   GFRAA 10* 08/20/2012 0540    Leonides Sake, MD [facility] [place] Office: [phone] Pager: [phone]  08/20/2012, 9:47 AM

## 2012-08-21 NOTE — Progress Notes (Signed)
[facility] Progress Note  Subjective:  No complaints seen post op in PACU  Objective Filed Vitals:   08/20/12 1800 08/20/12 2038 08/21/12 0601 08/21/12 0907  BP: 176/69 177/72 176/77 116/82  Pulse: 71 73 73 104  Temp: 98 F (36.7 C) 97.7 F (36.5 C) 97.7 F (36.5 C) 97.6 F (36.4 C)  TempSrc: Oral Oral Oral   Resp: 18 18 18 20   Height:  5\' 5"  (1.651 m)    Weight:  62.4 kg (137 lb 9.1 oz)    SpO2: 96% 99% 97% 94%   Physical Exam General: ill appearing Left IJ catheter placed Heart: Regular faint systolic murmur  Lungs: Clear Abdomen:Soft non tender Extremities:No edema Dialysis Access: left I-J  Dialysis Orders: Center: [facility] on MWF .  EDW 60.5 kg HD Bath 2K/2.5Ca Time 4 hrs Heparin 1200 U.  BFR 400 DFR A1.5  Zemplar 0 mcg IV/HD Epogen 0 Units IV/HD   Assessment/Plan:  1. Coag. Neg. Staph Bacteremia 08/13/12 BC; s/p new left I-J catheter placement 10/19 - continue Vanc 2. ESRD - MWF ([place]. ) HD today, back on MWF schedule Monday. 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 Anemia - hgb 10.1 - drifting down Start Aranesp with HD Sat , then changed to wed when back on mwf hd schedule 5. Secondary hyperparathyroidism - controlled with phoslo and no zemplar 6. HTN/volume - stable on Lopressor 25mg  bid, imdur 25mg  q day. Clonidine.2mg  bid,amloodipine 10mg  qday,        7.   Hx of PE - coumadin on hold Tunneled HD cath removedl and reinsert on Saturday- should be able to resume post procedure        8.  Type 2 DM  Lantus, ssi ; sugars 185 - 318       9.  Nutrition - high protein renal diet Alb 2.8   Sheffield Slider, PA-C [facility] [phone]  08/21/2012,9:19 AM  LOS: 8 days    Additional Objective Labs: Basic Metabolic Panel:  Lab 08/21/12 4540 08/20/12 0540 08/19/12 1427 08/18/12 0711 08/16/12 0719  NA 128* 136 138 -- --  K 4.3 3.6 3.6 -- --  CL 88* 94* 95* -- --  CO2 28 30 30  -- --  GLUCOSE 220* 163* 108* --  --  BUN 41* 27* 21 -- --  CREATININE 5.05* 4.55* 3.50* -- --  CALCIUM 8.7 8.9 9.2 -- --  ALB -- -- -- -- --  PHOS 3.5 -- -- 3.2 4.1   Liver Function Tests:  Lab 08/21/12 0540 08/18/12 0711 08/16/12 0719  AST -- -- --  ALT -- -- --  ALKPHOS -- -- --  BILITOT -- -- --  PROT -- -- --  ALBUMIN 2.8* 2.6* 2.9*   CBC:  Lab 08/21/12 0540 08/20/12 0540 08/19/12 1427 08/18/12 0711 08/17/12 0520  WBC 9.4 9.5 10.1 -- --  NEUTROABS -- -- -- -- --  HGB 10.1* 10.5* 11.7* -- --  HCT 30.2* 32.6* 35.6* -- --  MCV 86.5 87.2 87.3 86.5 88.2  PLT 322 314 306 -- --   Blood Culture    Component Value Date/Time   SDES CATH TIP 08/19/2012 0912   SPECREQUEST DIALYSIS CATH 08/19/2012 0912   CULT NO GROWTH 2 DAYS 08/19/2012 0912   REPTSTATUS PENDING 08/19/2012 0912  CBG:  Lab 08/20/12 2041 08/20/12 1643 08/20/12 1126 08/20/12 0754 08/19/12 2148  GLUCAP 219* 318* 242* 185* 257*  Medications:      . amLODipine  10 mg Oral Daily  . aspirin  81 mg Oral Daily  . calcium acetate  667 mg Oral BID WC  . cloNIDine  0.2 mg Oral BID  . darbepoetin (ARANESP) injection - DIALYSIS  60 mcg Intravenous Q Sat-HD  . insulin aspart  0-15 Units Subcutaneous TID WC  . insulin aspart  0-5 Units Subcutaneous QHS  . insulin glargine  12 Units Subcutaneous QHS  . isosorbide mononitrate  120 mg Oral Daily  . levothyroxine  25 mcg Oral Q0600  . metoprolol tartrate  25 mg Oral BID  . pantoprazole  40 mg Oral Q1200  . phytonadione (VITAMIN K) IV  5 mg Intravenous Once  . simvastatin  10 mg Oral q1800  . vancomycin  750 mg Intravenous Q M,W,F-HD  . vancomycin  750 mg Intravenous Q Sat-HD  . DISCONTD: vancomycin  500 mg Intravenous Q M,W,F-HD

## 2012-08-21 NOTE — Interval H&P Note (Signed)
History and Physical Interval Note:  08/21/2012 7:49 AM  [name] [name]  has presented today for surgery, with the diagnosis of ESRD  The various methods of treatment have been discussed with the patient and family. After consideration of risks, benefits and other options for treatment, the patient has consented to  Procedure(s) (LRB) with comments: INSERTION OF DIALYSIS CATHETER (N/A) as a surgical intervention .  The patient's history has been reviewed, patient examined, no change in status, stable for surgery.  I have reviewed the patient's chart and labs.  Questions were answered to the patient's satisfaction.     [name] [name]  Pt for Diatek catheter today.  Procedure risk benefit discussed.  Fabienne Bruns, MD [facility] [place] Office: [phone] Pager: [phone]

## 2012-08-21 NOTE — Op Note (Signed)
Procedure: Ultrasound-guided insertion of Diatek catheter left internal jugular vein  Preoperative diagnosis: End-stage renal disease  Postoperative diagnosis: Same  Anesthesia: Local with IV sedation  Operative findings: 23 cm Diatek catheter left internal jugular vein  Operative details: After obtaining informed consent, the patient was taken to the operating room. The patient was placed in supine position on the operating room table. After adequate sedation the patient's entire neck and chest were prepped and draped in usual sterile fashion. The patient was placed in Trendelenburg position. Ultrasound was used to identify the patient's left internal jugular vein. This had normal compressibility and respiratory variation. Local anesthesia was infiltrated over the left jugular vein.  Using ultrasound guidance, the left internal jugular vein was successfully cannulated.  A 0.035 J-tipped guidewire was threaded into the left internal jugular vein and into the superior vena cava followed by the inferior vena cava under fluoroscopic guidance.   Next sequential 12 and 14 dilators were placed over the guidewire into the right atrium.  A 16 French dilator with a peel-away sheath was then placed over the guidewire into the right atrium.   The guidewire and dilator were removed. A 23 cm Diatek catheter was then placed through the peel away sheath into the right atrium.  The catheter was then tunneled subcutaneously, cut to length, and the hub attached. The catheter was noted to flush and draw easily. The catheter was inspected under fluoroscopy and found with its tip to be in the right atrium without any kinks throughout its course. The catheter was sutured to the skin with nylon sutures. The neck insertion site was closed with Vicryl stitch. The catheter was then loaded with concentrated Heparin solution. A dry sterile dressing was applied.  The patient tolerated procedure well and there were no complications.  Instrument sponge and needle counts correct in the case. The patient was taken to the recovery room in stable condition. Chest x-ray will be obtained in the recovery room.  Fabienne Bruns, MD [facility] [place] Office: [phone] Pager: [phone]

## 2012-08-21 NOTE — Transfer of Care (Signed)
Immediate Anesthesia Transfer of Care Note  Patient: [name] [name]  Procedure(s) Performed: Procedure(s) (LRB) with comments: INSERTION OF DIALYSIS CATHETER (Left) - internal jugular  Patient Location: PACU  Anesthesia Type: MAC  Level of Consciousness: awake  Airway & Oxygen Therapy: Patient Spontanous Breathing and Patient connected to nasal cannula oxygen  Post-op Assessment: Report given to PACU RN  Post vital signs: Reviewed and stable  Complications: No apparent anesthesia complications

## 2012-08-21 NOTE — Progress Notes (Signed)
ANTICOAGULATION CONSULT NOTE - Initial Consult  Pharmacy Consult for Coumadin Indication: history PE  Allergies  Allergen Reactions  . Procaine Hcl Nausea And Vomiting and Other (See Comments)    "Pass out"    Patient Measurements: Height: 5\' 5"  (165.1 cm) Weight: 143 lb 11.8 oz (65.2 kg) IBW/kg (Calculated) : 57   Vital Signs: Temp: 97.7 F (36.5 C) (10/19 1430) Temp src: Oral (10/19 1430) BP: 156/67 mmHg (10/19 1800) Pulse Rate: 86  (10/19 1800)  Labs:  Basename 08/21/12 0540 08/20/12 0540 08/19/12 1427 08/19/12 0746  HGB 10.1* 10.5* -- --  HCT 30.2* 32.6* 35.6* --  PLT 322 314 306 --  APTT -- -- -- --  LABPROT 12.2 -- 12.2 16.1*  INR 0.91 -- 0.91 1.32  HEPARINUNFRC -- -- -- --  CREATININE 5.05* 4.55* 3.50* --  CKTOTAL -- -- -- --  CKMB -- -- -- --  TROPONINI -- -- -- --    Estimated Creatinine Clearance: 9.5 ml/min (by C-G formula based on Cr of 5.05).   Medical History: Past Medical History  Diagnosis Date  . Diabetes mellitus   . Hypercholesterolemia   . Hypothyroidism   . Colitis   . Coronary artery disease   . Hypertension   . GERD (gastroesophageal reflux disease)   . Myocardial infarction   . Peripheral vascular disease   . Pneumonia   . Angina   . H/O Clostridium difficile infection   . Hx of pulmonary embolus   . COPD (chronic obstructive pulmonary disease)   . Shortness of breath     uses O2 as needed  . Chronic kidney disease     M-W-F       Medications:  Scheduled:    . amLODipine  10 mg Oral Daily  . aspirin  81 mg Oral Daily  . calcium acetate  667 mg Oral BID WC  . cloNIDine  0.2 mg Oral BID  . darbepoetin (ARANESP) injection - DIALYSIS  60 mcg Intravenous Q Sat-HD  . insulin aspart  0-15 Units Subcutaneous TID WC  . insulin aspart  0-5 Units Subcutaneous QHS  . insulin glargine  12 Units Subcutaneous QHS  . isosorbide mononitrate  120 mg Oral Daily  . levothyroxine  25 mcg Oral Q0600  . metoprolol tartrate  25 mg  Oral BID  . pantoprazole  40 mg Oral Q1200  . phytonadione (VITAMIN K) IV  5 mg Intravenous Once  . simvastatin  10 mg Oral q1800  . vancomycin  750 mg Intravenous Q M,W,F-HD  . vancomycin  750 mg Intravenous Q Sat-HD    Assessment: 71 yo female on chronic Coumadin for history of PE that has been on hold for cath removal. Pharmacy consulted to manage inpatient. INR is at baseline.  Home dose: 2 mg daily  Goal of Therapy:  INR 2-3   Plan:  -Coumadin 3 mg po tonight -INR daily  [facility], [address].D., BCPS Clinical Pharmacist Pager: [phone] 08/21/2012 7:01 PM

## 2012-08-21 NOTE — Anesthesia Postprocedure Evaluation (Signed)
Anesthesia Post Note  Patient: [name] [name]  Procedure(s) Performed: Procedure(s) (LRB): INSERTION OF DIALYSIS CATHETER (Left)  Anesthesia type: MAC  Patient location: PACU  Post pain: Pain level controlled  Post assessment: Patient's Cardiovascular Status Stable  Last Vitals:  Filed Vitals:   08/21/12 1015  BP:   Pulse:   Temp: 36.6 C  Resp:     Post vital signs: Reviewed and stable  Level of consciousness: alert  Complications: No apparent anesthesia complications

## 2012-08-22 LAB — GLUCOSE, CAPILLARY
Glucose-Capillary: 213 mg/dL — ABNORMAL HIGH (ref 70–99)
Glucose-Capillary: 300 mg/dL — ABNORMAL HIGH (ref 70–99)

## 2012-08-22 LAB — BASIC METABOLIC PANEL
Calcium: 8.9 mg/dL (ref 8.4–10.5)
Creatinine, Ser: 2.72 mg/dL — ABNORMAL HIGH (ref 0.50–1.10)
GFR calc non Af Amer: 17 mL/min — ABNORMAL LOW (ref >90)
Glucose, Bld: 226 mg/dL — ABNORMAL HIGH (ref 70–99)
Sodium: 136 mEq/L (ref 135–145)

## 2012-08-22 LAB — CATH TIP CULTURE: Culture: NO GROWTH

## 2012-08-22 LAB — CULTURE, BLOOD (ROUTINE X 2)

## 2012-08-22 LAB — PROTIME-INR: INR: 1.05 (ref 0.00–1.49)

## 2012-08-22 MED ORDER — WARFARIN SODIUM 3 MG PO TABS
3.0000 mg | ORAL_TABLET | Freq: Once | ORAL | Status: AC
  Administered 2012-08-22: 3 mg via ORAL
  Filled 2012-08-22: qty 1

## 2012-08-22 MED ORDER — RENA-VITE PO TABS
1.0000 | ORAL_TABLET | Freq: Every day | ORAL | Status: DC
  Administered 2012-08-22 – 2012-08-23 (×2): 1 via ORAL
  Filled 2012-08-22 (×2): qty 1

## 2012-08-22 MED ORDER — DARBEPOETIN ALFA-POLYSORBATE 60 MCG/0.3ML IJ SOLN: 60.0000 ug | INTRAMUSCULAR | Status: DC

## 2012-08-22 MED ORDER — HEPARIN SODIUM (PORCINE) 1000 UNIT/ML DIALYSIS
20.0000 [IU]/kg | INTRAMUSCULAR | Status: DC | PRN
  Administered 2012-08-23: 1300 [IU] via INTRAVENOUS_CENTRAL
  Filled 2012-08-22: qty 2

## 2012-08-22 NOTE — Progress Notes (Signed)
ANTICOAGULATION CONSULT NOTE - Follow-up  Pharmacy Consult for Coumadin Indication: history PE  Allergies  Allergen Reactions  . Procaine Hcl Nausea And Vomiting and Other (See Comments)    "Pass out"    Patient Measurements: Height: 5\' 5"  (165.1 cm) Weight: 143 lb 15.4 oz (65.3 kg) IBW/kg (Calculated) : 57   Vital Signs: Temp: 97.7 F (36.5 C) (10/20 1030) Temp src: Oral (10/20 1030) BP: 156/74 mmHg (10/20 1030) Pulse Rate: 88  (10/20 1030)  Labs:  Basename 08/22/12 0539 08/21/12 0540 08/20/12 0540 08/19/12 1427  HGB -- 10.1* 10.5* --  HCT -- 30.2* 32.6* 35.6*  PLT -- 322 314 306  APTT -- -- -- --  LABPROT 13.6 12.2 -- 12.2  INR 1.05 0.91 -- 0.91  HEPARINUNFRC -- -- -- --  CREATININE 2.72* 5.05* 4.55* --  CKTOTAL -- -- -- --  CKMB -- -- -- --  TROPONINI -- -- -- --    Estimated Creatinine Clearance: 17.6 ml/min (by C-G formula based on Cr of 2.72).  Assessment: 71 yo female on chronic Coumadin for history of PE that has been on hold for cath removal. Pharmacy consulted to manage inpatient. INR remains subtherapeutic as expected at 1.05. No bleeding noted  Home dose: 2 mg daily  Goal of Therapy:  INR 2-3   Plan:  -Coumadin 3 mg po tonight -INR daily  Lysle Pearl, PharmD, BCPS Pager # [phone] 08/22/2012 11:44 AM

## 2012-08-22 NOTE — Progress Notes (Addendum)
[facility]             Progress Note  Brief summary: [name] [name] is a 71/F with DM, COPD, PE on coumadin, ESRD on HD via HD catheter and recent AVF, presented to [facility] with N/V/D and fever subsequently found to have Coag negative staph bacteremia, stable on Vanc, plan for HD cath removal  Assessment/Plan:  1. Coagulase negative staph bacteremia 2/2 from 10/11 Sensitive to Vanc Source suspected to be HD Catheter related with seeding during recent AVF placement Continue Vancomycin,  -s/p HD catheter removal per VVS today 10/17 (INR <1.7; O.91) -repeat blood cultures 10/14 with no growth to date -appreciate VVS and renal assistance. -s/p new dialysis catheter placement today 10/19 a.m. per VVS  2. N/V/Diarrhea due to 1, resolved cdiff PCR negative  3. ESRD on HD MWF per renal -dialysis in am, ?Home after dialysis tomorrow 4. H/o PE on coumadin -coumadin resumed, INR still subtherapeutic, follow  5. COPD/Chronic resp failure on 2-3L home O2, stable  6. DM: CBGs stable, improved, continue lantus, SSI  7. Anemia of chronic disease; stable, Hb 10.7-11.4, recheck in am 8. HTN: stable, continue Amlodipine, clonidine, imdur, metoprolol  9.Hyponatremia: resolved,2/2 to fluid shifts in this dialysis pt    Subjective: Patient denies any complaints today Objective: Vital signs in last 24 hours: Temp:  [97.7 F (36.5 [name])-98.2 F (36.8 [name])] 97.7 F (36.5 [name]) (10/20 1030) Pulse Rate:  [79-88] 88  (10/20 1030) Resp:  [16-19] 18  (10/20 1030) BP: (130-186)/(44-80) 156/74 mmHg (10/20 1030) SpO2:  [92 %-100 %] 92 % (10/20 1030) Weight:  [65.2 kg (143 lb 11.8 oz)-65.3 kg (143 lb 15.4 oz)] 65.3 kg (143 lb 15.4 oz) (10/19 2112) Weight change: 2.8 kg (6 lb 2.8 oz) Last BM Date: 08/15/12  Intake/Output from previous day: 10/19 0701 - 10/20 0700 In: 470 [P.O.:120; I.V.:350] Out: 2922 [Urine:125; Blood:30]     Physical Exam: General: Alert, awake, oriented x3, in no acute  distress. HEENT: No bruits, no goiter.  Heart: Regular rate and rhythm, without murmurs, rubs, gallops. Lungs: Clear to auscultation bilaterally. Abdomen: Soft, nontender, nondistended, positive bowel sounds. Extremities: No clubbing cyanosis or edema with positive pedal pulses Neuro: Grossly intact, nonfocal.    Lab Results: Basic Metabolic Panel:  Basename 08/22/12 0539 08/21/12 0540  NA 136 128*  K 3.5 4.3  CL 98 88*  CO2 29 28  GLUCOSE 226* 220*  BUN 16 41*  CREATININE 2.72* 5.05*  CALCIUM 8.9 8.7  MG -- --  PHOS -- 3.5   Liver Function Tests:  Basename 08/21/12 0540  AST --  ALT --  ALKPHOS --  BILITOT --  PROT --  ALBUMIN 2.8*   No results found for this basename: LIPASE:2,AMYLASE:2 in the last 72 hours No results found for this basename: AMMONIA:2 in the last 72 hours CBC:  Basename 08/21/12 0540 08/20/12 0540  WBC 9.4 9.5  NEUTROABS -- --  HGB 10.1* 10.5*  HCT 30.2* 32.6*  MCV 86.5 87.2  PLT 322 314   Cardiac Enzymes: No results found for this basename: CKTOTAL:3,CKMB:3,CKMBINDEX:3,TROPONINI:3 in the last 72 hours BNP: No results found for this basename: PROBNP:3 in the last 72 hours D-Dimer: No results found for this basename: DDIMER:2 in the last 72 hours CBG:  Basename 08/22/12 1156 08/22/12 0759 08/21/12 2131 08/21/12 1041 08/21/12 0922 08/20/12 2041  GLUCAP 196* 213* 220* 251* 229* 219*   Hemoglobin A1C: No results found for this basename: HGBA1C in the last 72 hours Fasting  Lipid Panel: No results found for this basename: CHOL,HDL,LDLCALC,TRIG,CHOLHDL,LDLDIRECT in the last 72 hours Thyroid Function Tests: No results found for this basename: TSH,T4TOTAL,FREET4,T3FREE,THYROIDAB in the last 72 hours Anemia Panel: No results found for this basename: VITAMINB12,FOLATE,FERRITIN,TIBC,IRON,RETICCTPCT in the last 72 hours Coagulation:  Basename 08/22/12 0539 08/21/12 0540  LABPROT 13.6 12.2  INR 1.05 0.91   Urine Drug Screen: Drugs of  Abuse  No results found for this basename: labopia,  cocainscrnur,  labbenz,  amphetmu,  thcu,  labbarb    Alcohol Level: No results found for this basename: ETH:2 in the last 72 hours Urinalysis: No results found for this basename: COLORURINE:2,APPERANCEUR:2,LABSPEC:2,PHURINE:2,GLUCOSEU:2,HGBUR:2,BILIRUBINUR:2,KETONESUR:2,PROTEINUR:2,UROBILINOGEN:2,NITRITE:2,LEUKOCYTESUR:2 in the last 72 hours  Recent Results (from the past 240 hour(s))  CULTURE, BLOOD (ROUTINE X 2)     Status: Normal   Collection Time   08/13/12  8:30 PM      Component Value Range Status Comment   Specimen Description BLOOD ARM RIGHT   Final    Special Requests BOTTLES DRAWN AEROBIC AND ANAEROBIC 10CC   Final    Culture  Setup Time 08/14/2012 01:08   Final    Culture     Final    Value: STAPHYLOCOCCUS SPECIES (COAGULASE NEGATIVE)     Note: SUSCEPTIBILITIES PERFORMED ON PREVIOUS CULTURE WITHIN THE LAST 5 DAYS.     Note: Gram Stain Report Called to,Read Back By and Verified With: [name] Merritts RN on 08/15/12 at 01:25 by [name] [name]   Report Status 08/16/2012 FINAL   Final   CULTURE, BLOOD (ROUTINE X 2)     Status: Normal   Collection Time   08/13/12  8:40 PM      Component Value Range Status Comment   Specimen Description BLOOD RIGHT HAND   Final    Special Requests BOTTLES DRAWN AEROBIC AND ANAEROBIC 10CC   Final    Culture  Setup Time 08/14/2012 01:08   Final    Culture     Final    Value: STAPHYLOCOCCUS SPECIES (COAGULASE NEGATIVE)     Note: RIFAMPIN AND GENTAMICIN SHOULD NOT BE USED AS SINGLE DRUGS FOR TREATMENT OF STAPH INFECTIONS.     Note: Gram Stain Report Called to,Read Back By and Verified With: [name] [name] 08/14/12 @ 8:45PM BY RUSCA.   Report Status 08/16/2012 FINAL   Final    Organism ID, Bacteria STAPHYLOCOCCUS SPECIES (COAGULASE NEGATIVE)   Final   CLOSTRIDIUM DIFFICILE BY PCR     Status: Normal   Collection Time   08/14/12 10:03 PM      Component Value Range Status Comment   [name] difficile by pcr  NEGATIVE  NEGATIVE Final   CULTURE, BLOOD (ROUTINE X 2)     Status: Normal   Collection Time   08/16/12 12:20 PM      Component Value Range Status Comment   Specimen Description BLOOD RIGHT ARM   Final    Special Requests BOTTLES DRAWN AEROBIC ONLY [facility]   Final    Culture  Setup Time 08/16/2012 18:29   Final    Culture NO GROWTH 5 DAYS   Final    Report Status 08/22/2012 FINAL   Final   CULTURE, BLOOD (ROUTINE X 2)     Status: Normal   Collection Time   08/16/12 12:30 PM      Component Value Range Status Comment   Specimen Description BLOOD RIGHT HAND   Final    Special Requests BOTTLES DRAWN AEROBIC ONLY 3CC   Final    Culture  Setup Time 08/16/2012  18:29   Final    Culture NO GROWTH 5 DAYS   Final    Report Status 08/22/2012 FINAL   Final   MRSA PCR SCREENING     Status: Normal   Collection Time   08/16/12  2:29 PM      Component Value Range Status Comment   MRSA by PCR NEGATIVE  NEGATIVE Final   CATH TIP CULTURE     Status: Normal   Collection Time   08/19/12  9:12 AM      Component Value Range Status Comment   Specimen Description CATH TIP   Final    Special Requests DIALYSIS CATH   Final    Culture NO GROWTH 2 DAYS   Final    Report Status 08/22/2012 FINAL   Final     Studies/Results: Dg Chest Port 1 View  08/21/2012  *RADIOLOGY REPORT*  Clinical Data: Central line insertion.  Diabetes.  PORTABLE CHEST - 1 VIEW  Comparison: 08/13/2012  Findings: Left IJ dialysis catheter noted with distal tip at the cavoatrial junction.  No pneumothorax.  Stable faint interstitial accentuation noted.  Atherosclerotic calcification of the aortic arch is present.  Heart size within normal limits for technique. Prior right IJ line has been removed.  IMPRESSION:  1.  Replacement of the prior right IJ line with a left internal jugular dialysis catheter.  Distal tip at cavoatrial junction; no pneumothorax.   Original Report Authenticated By: Dellia Cloud, M.D.    Dg Fluoro Guide Cv  Line-no Report  08/21/2012  CLINICAL DATA: surgery   FLOURO GUIDE CV LINE  Fluoroscopy was utilized by the requesting physician.  No radiographic  interpretation.      Medications: Scheduled Meds:    . amLODipine  10 mg Oral Daily  . aspirin  81 mg Oral Daily  . calcium acetate  667 mg Oral BID WC  . cloNIDine  0.2 mg Oral BID  . darbepoetin (ARANESP) injection - DIALYSIS  60 mcg Intravenous Q Fri-HD  . insulin aspart  0-15 Units Subcutaneous TID WC  . insulin aspart  0-5 Units Subcutaneous QHS  . insulin glargine  12 Units Subcutaneous QHS  . isosorbide mononitrate  120 mg Oral Daily  . levothyroxine  25 mcg Oral Q0600  . metoprolol tartrate  25 mg Oral BID  . multivitamin  1 tablet Oral Daily  . pantoprazole  40 mg Oral Q1200  . simvastatin  10 mg Oral q1800  . vancomycin  750 mg Intravenous Q M,W,F-HD  . vancomycin  750 mg Intravenous Q Sat-HD  . warfarin  3 mg Oral Once  . warfarin  3 mg Oral ONCE-1800  . Warfarin - Pharmacist Dosing Inpatient   Does not apply q1800  . DISCONTD: darbepoetin (ARANESP) injection - DIALYSIS  60 mcg Intravenous Q Sat-HD  . DISCONTD: phytonadione (VITAMIN K) IV  5 mg Intravenous Once   Continuous Infusions:  PRN Meds:.acetaminophen, acetaminophen, fentaNYL, heparin, HYDROcodone-acetaminophen, metoCLOPramide, nitroGLYCERIN, oxyCODONE, oxyCODONE, sodium chloride    Time spent coordinating care:   LOS: 9 days   [name] [name] [facility] Pager: [phone] 08/22/2012, 2:05 PM

## 2012-08-22 NOTE — Progress Notes (Signed)
[facility] Progress Note  Subjective: Slept well. Cath site sore, but not painful. Feels good.  Objective Filed Vitals:   08/21/12 1857 08/21/12 1915 08/21/12 2112 08/22/12 0457  BP: 173/78 186/71 130/52 134/44  Pulse: 87 88 86 81  Temp: 97.7 F (36.5 C)  98 F (36.7 C) 98.2 F (36.8 C)  TempSrc: Oral  Oral Oral  Resp: 16 17 16 16   Height:      Weight:   65.3 kg (143 lb 15.4 oz)   SpO2:  98% 95% 92%   Physical Exam General: Sitting up eating breakfast. Heart: RRR Lungs: decreased BS right base, otherwise  no wheezes or rales Abdomen: soft Extremities: no Le edema; left arm mild edema and erythema. (site of ligated AVF); left heel wound dressed (Wound care gave rec for care) Dialysis Access: left I-J  Dialysis Orders: Center: [facility] on MWF .  EDW 60.5 kg HD Bath 2K/2.5Ca Time 4 hrs Heparin 1200 U. Access Right IJ catheter, AVF @ LUA BFR 400 DFR A1.5  Zemplar 0 mcg IV/HD Epogen 0 Units IV/HD Venofer   Assessment/Plan:  1. Coag. Neg. Staph Bacteremia 08/13/12 BC; s/p new left I-J catheter placement 10/19 - continue Vanc - on day 10 2. ESRD - MWF ([place]. ) HD Saturday with net UF of, 2.7 back on MWF schedule Monday- first round. Pre HD wt was 65.3. No post wt recorded.  Wt later pm was still 65.3 - doesn't make sense.  Either way, still above EDW. K a little low at 3.5 post HD  3. 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 Anemia - hgb 10.1 - drifting down Started Aranesp with HD Sat , then changed to Fri  5. Secondary hyperparathyroidism - controlled with phoslo and no zemplar 6. HTN/volume - stable on Lopressor 25mg  bid, imdur 25mg  q day. Clonidine.2mg  bid,amloodipine 10mg  qday,        7.   Hx of PE - coumadin resumed post [facility] placement; could titrate up as an outpt.       8.  Type 2 DM Lantus, ssi ; sugars 200s       9.   Nutrition - high protein renal diet Alb 2.8   Sheffield Slider, PA-C [facility] Beeper  [phone]  08/22/2012,8:42 AM  LOS: 9 days    Additional Objective Labs: Basic Metabolic Panel: Lab Results  Component Value Date   INR 1.05 08/22/2012   INR 0.91 08/21/2012   INR 0.91 08/19/2012     Lab 08/22/12 0539 08/21/12 0540 08/20/12 0540 08/18/12 0711 08/16/12 0719  NA 136 128* 136 -- --  K 3.5 4.3 3.6 -- --  CL 98 88* 94* -- --  CO2 29 28 30  -- --  GLUCOSE 226* 220* 163* -- --  BUN 16 41* 27* -- --  CREATININE 2.72* 5.05* 4.55* -- --  CALCIUM 8.9 8.7 8.9 -- --  ALB -- -- -- -- --  PHOS -- 3.5 -- 3.2 4.1   Liver Function Tests:  Lab 08/21/12 0540 08/18/12 0711 08/16/12 0719  AST -- -- --  ALT -- -- --  ALKPHOS -- -- --  BILITOT -- -- --  PROT -- -- --  ALBUMIN 2.8* 2.6* 2.9*   CBC:  Lab 08/21/12 0540 08/20/12 0540 08/19/12 1427 08/18/12 0711 08/17/12 0520  WBC 9.4 9.5 10.1 -- --  NEUTROABS -- -- -- -- --  HGB 10.1* 10.5* 11.7* -- --  HCT 30.2* 32.6* 35.6* -- --  MCV 86.5 87.2 87.3 86.5 88.2  PLT 322 314 306 -- --   Blood Culture    Component Value Date/Time   SDES CATH TIP 08/19/2012 0912   SPECREQUEST DIALYSIS CATH 08/19/2012 0912   CULT NO GROWTH 2 DAYS 08/19/2012 0912   REPTSTATUS 08/22/2012 FINAL 08/19/2012 0912   CBG:  Lab 08/22/12 0759 08/21/12 2131 08/21/12 1041 08/21/12 0922 08/20/12 2041  GLUCAP 213* 220* 251* 229* 219*  Studies/Results: Dg Chest Port 1 View  08/21/2012  *RADIOLOGY REPORT*  Clinical Data: Central line insertion.  Diabetes.  PORTABLE CHEST - 1 VIEW  Comparison: 08/13/2012  Findings: Left IJ dialysis catheter noted with distal tip at the cavoatrial junction.  No pneumothorax.  Stable faint interstitial accentuation noted.  Atherosclerotic calcification of the aortic arch is present.  Heart size within normal limits for technique. Prior right IJ line has been removed.  IMPRESSION:  1.  Replacement of the prior right IJ line with a left internal jugular dialysis catheter.  Distal tip at cavoatrial junction; no pneumothorax.    Original Report Authenticated By: Dellia Cloud, M.D.    Dg Fluoro Guide Cv Line-no Report  08/21/2012  CLINICAL DATA: surgery   FLOURO GUIDE CV LINE  Fluoroscopy was utilized by the requesting physician.  No radiographic  interpretation.     Medications:      . amLODipine  10 mg Oral Daily  . aspirin  81 mg Oral Daily  . calcium acetate  667 mg Oral BID WC  . cloNIDine  0.2 mg Oral BID  . darbepoetin (ARANESP) injection - DIALYSIS  60 mcg Intravenous Q Sat-HD  . insulin aspart  0-15 Units Subcutaneous TID WC  . insulin aspart  0-5 Units Subcutaneous QHS  . insulin glargine  12 Units Subcutaneous QHS  . isosorbide mononitrate  120 mg Oral Daily  . levothyroxine  25 mcg Oral Q0600  . metoprolol tartrate  25 mg Oral BID  . pantoprazole  40 mg Oral Q1200  . phytonadione (VITAMIN K) IV  5 mg Intravenous Once  . simvastatin  10 mg Oral q1800  . vancomycin  750 mg Intravenous Q M,W,F-HD  . vancomycin  750 mg Intravenous Q Sat-HD  . warfarin  3 mg Oral Once  . Warfarin - Pharmacist Dosing Inpatient   Does not apply [phone]

## 2012-08-22 NOTE — Progress Notes (Signed)
I have seen and examined this patient and agree with the plan of care  . Plan dialysis in AM MWF . Has new catheter placed 10/19  [facility] W 08/22/2012, 10:25 AM

## 2012-08-23 ENCOUNTER — Other Ambulatory Visit: Payer: Self-pay | Admitting: *Deleted

## 2012-08-23 ENCOUNTER — Inpatient Hospital Stay (HOSPITAL_COMMUNITY): Payer: Medicare Other

## 2012-08-23 DIAGNOSIS — Z01810 Encounter for preprocedural cardiovascular examination: Secondary | ICD-10-CM

## 2012-08-23 DIAGNOSIS — T82898A Other specified complication of vascular prosthetic devices, implants and grafts, initial encounter: Secondary | ICD-10-CM

## 2012-08-23 LAB — RENAL FUNCTION PANEL
Albumin: 2.9 g/dL — ABNORMAL LOW (ref 3.5–5.2)
BUN: 31 mg/dL — ABNORMAL HIGH (ref 6–23)
CO2: 28 mEq/L (ref 19–32)
Calcium: 8.9 mg/dL (ref 8.4–10.5)
Chloride: 92 mEq/L — ABNORMAL LOW (ref 96–112)
Creatinine, Ser: 4.02 mg/dL — ABNORMAL HIGH (ref 0.50–1.10)
GFR calc Af Amer: 12 mL/min — ABNORMAL LOW (ref >90)
GFR calc non Af Amer: 10 mL/min — ABNORMAL LOW (ref >90)
Glucose, Bld: 233 mg/dL — ABNORMAL HIGH (ref 70–99)
Phosphorus: 4.1 mg/dL (ref 2.3–4.6)
Potassium: 3.5 mEq/L (ref 3.5–5.1)
Sodium: 130 mEq/L — ABNORMAL LOW (ref 135–145)

## 2012-08-23 LAB — GLUCOSE, CAPILLARY
Glucose-Capillary: 145 mg/dL — ABNORMAL HIGH (ref 70–99)
Glucose-Capillary: 313 mg/dL — ABNORMAL HIGH (ref 70–99)

## 2012-08-23 LAB — CBC
Hemoglobin: 9.7 g/dL — ABNORMAL LOW (ref 12.0–15.0)
MCH: 28.4 pg (ref 26.0–34.0)
RBC: 3.41 MIL/uL — ABNORMAL LOW (ref 3.87–5.11)

## 2012-08-23 MED ORDER — WARFARIN SODIUM 3 MG PO TABS: 3.0000 mg | ORAL_TABLET | Freq: Once | ORAL | Status: DC

## 2012-08-23 MED ORDER — WARFARIN SODIUM 3 MG PO TABS
3.0000 mg | ORAL_TABLET | Freq: Once | ORAL | Status: DC
  Filled 2012-08-23: qty 1

## 2012-08-23 MED ORDER — HYDROCODONE-ACETAMINOPHEN 5-325 MG PO TABS: 1.0000 | ORAL_TABLET | ORAL | Status: DC | PRN

## 2012-08-23 MED ORDER — RENA-VITE PO TABS: 1.0000 | ORAL_TABLET | Freq: Every day | ORAL | Status: DC

## 2012-08-23 MED ORDER — ENOXAPARIN SODIUM 60 MG/0.6ML ~~LOC~~ SOLN
60.0000 mg | Freq: Once | SUBCUTANEOUS | Status: AC
  Administered 2012-08-23: 60 mg via SUBCUTANEOUS
  Filled 2012-08-23: qty 0.6

## 2012-08-23 NOTE — Progress Notes (Addendum)
ANTICOAGULATION CONSULT NOTE - Follow Up Consult  Pharmacy Consult for Coumadin and Vancomycin Indication: Hx PE; CoNS bacteremia  Allergies  Allergen Reactions  . Procaine Hcl Nausea And Vomiting and Other (See Comments)    "Pass out"    Patient Measurements: Height: 5\' 5"  (165.1 cm) Weight: 133 lb 2.5 oz (60.4 kg) IBW/kg (Calculated) : 57    Vital Signs: Temp: 96.8 F (36 C) (10/21 0916) Temp src: Oral (10/21 0916) BP: 150/64 mmHg (10/21 0916) Pulse Rate: 86  (10/21 0916)  Labs:  Basename 08/23/12 0500 08/22/12 0539 08/21/12 0540  HGB 9.7* -- 10.1*  HCT 29.6* -- 30.2*  PLT 279 -- 322  APTT -- -- --  LABPROT 15.4* 13.6 12.2  INR 1.24 1.05 0.91  HEPARINUNFRC -- -- --  CREATININE 4.02* 2.72* 5.05*  CKTOTAL -- -- --  CKMB -- -- --  TROPONINI -- -- --    Estimated Creatinine Clearance: 11.9 ml/min (by C-G formula based on Cr of 4.02).  Assessment: 1. 71yof on Coumadin for hx PE. Coumadin was restarted 10/19 s/p removal and insertion of new dialysis catheter. INR (1.24) is subtherapeutic but is trending up with Coumadin 3mg  (x 2) - will repeat dose tonight. PTA Coumadin regimen: 2mg  daily.  - H/H and Plts trending down - No significant bleeding reported  2. Patient is also on Vancomycin Day 11 for CoNS bacteremia thought to be related to RIJ tunneled dialysis catheter, now s/p removal on 10/17 and reinsertion of new catheter on 10/19. Patient has ESRD and receives HD on MWF. Random Vancomycin level was therapeutic on 10/18 at 15.68mcg/ml and patient has received appropriate post-HD doses since (10/19 and 10/21). Repeat blood cultures are final with no growth.  Goal of Therapy:  INR 2-3 Pre-HD Vancomycin level of 15-25 mcg/ml    Plan:  1. Repeat Coumadin 3mg  po x 1 today 2. Follow-up AM INR 3. Continue Vancomycin 750mg  IV qHD (MWF) 4. Will plan to check follow-up random Vancomycin level at end of week if Vancomycin continued 5. Follow-up HD schedule, cultures and  LOT  Cleon Dew [phone] 08/23/2012,9:28 AM

## 2012-08-23 NOTE — Progress Notes (Signed)
[name] Progress Note  Subjective: Looking forward to going home.  Objective Filed Vitals:   08/23/12 0830 08/23/12 0858 08/23/12 0910 08/23/12 0916  BP: 116/54 102/54 100/53 150/64  Pulse: 81 86 85 86  Temp:    96.8 F (36 C)  TempSrc:    Oral  Resp: 18 16 16 18   Height:      Weight:    60.4 kg (133 lb 2.5 oz)  SpO2:    95%   Physical Exam General: Sitting up eating breakfast. Heart: RRR Lungs: decreased BS right base, otherwise  no wheezes or rales Abdomen: soft Extremities: no Le edema; left arm mild edema and erythema. (site of ligated AVF); left heel wound dressed (Wound care gave rec for care) Dialysis Access: left I-J  Dialysis Orders: Center: [facility] on MWF .  EDW 60.5 kg HD Bath 2K/2.5Ca Time 4 hrs Heparin 1200 U. Access Right IJ catheter, AVF @ LUA BFR 400 DFR A1.5  Zemplar 0 mcg IV/HD Epogen 0 Units IV/HD Venofer   Assessment/Plan:  1. Coag. Neg. Staph Bacteremia 08/13/12 BC; s/p new left I-J catheter placement 10/19 - continue Vanc - on day 11 or 14 days of therapy 2. ESRD - MWF [place]. Resume usual MWF as outpatient. Using TDC, no perm access, see #3 below. 3. 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 Anemia - hgb 10.1 - drifting down Started Aranesp with HD Sat , then changed to Fri  5. Secondary hyperparathyroidism - controlled with phoslo and no zemplar 6. HTN/volume - stable on Lopressor 25mg  bid, imdur 25mg  q day. Clonidine.2mg  bid,amloodipine 10mg  qday,        7.   Hx of PE - coumadin resumed post [facility] placement; could titrate up as an outpt.       8.  Type 2 DM Lantus, ssi ; sugars 200s       9.   Nutrition - high protein renal diet Alb 2.8        10. Dispo- anticipated d/c today after HD  [name] Moselle  MD [facility] [phone] pgr    [phone] cell 08/23/2012, 9:39 AM    Additional Objective Labs: Basic Metabolic Panel: Lab Results  Component Value Date   INR 1.24 08/23/2012   INR 1.05  08/22/2012   INR 0.91 08/21/2012     Lab 08/23/12 0500 08/22/12 0539 08/21/12 0540 08/18/12 0711  NA 130* 136 128* --  K 3.5 3.5 4.3 --  CL 92* 98 88* --  CO2 28 29 28  --  GLUCOSE 233* 226* 220* --  BUN 31* 16 41* --  CREATININE 4.02* 2.72* 5.05* --  CALCIUM 8.9 8.9 8.7 --  ALB -- -- -- --  PHOS 4.1 -- 3.5 3.2   Liver Function Tests:  Lab 08/23/12 0500 08/21/12 0540 08/18/12 0711  AST -- -- --  ALT -- -- --  ALKPHOS -- -- --  BILITOT -- -- --  PROT -- -- --  ALBUMIN 2.9* 2.8* 2.6*   CBC:  Lab 08/23/12 0500 08/21/12 0540 08/20/12 0540 08/19/12 1427 08/18/12 0711  WBC 9.8 9.4 9.5 -- --  NEUTROABS -- -- -- -- --  HGB 9.7* 10.1* 10.5* -- --  HCT 29.6* 30.2* 32.6* -- --  MCV 86.8 86.5 87.2 87.3 86.5  PLT 279 322 314 -- --   Blood Culture    Component Value Date/Time   SDES CATH TIP 08/19/2012 0912   SPECREQUEST DIALYSIS CATH 08/19/2012 4782  CULT NO GROWTH 2 DAYS 08/19/2012 0912   REPTSTATUS 08/22/2012 FINAL 08/19/2012 0912   CBG:  Lab 08/22/12 2116 08/22/12 1724 08/22/12 1156 08/22/12 0759 08/21/12 2131  GLUCAP 232* 300* 196* 213* 220*  Studies/Results: Dg Chest Port 1 View  08/21/2012  *RADIOLOGY REPORT*  Clinical Data: Central line insertion.  Diabetes.  PORTABLE CHEST - 1 VIEW  Comparison: 08/13/2012  Findings: Left IJ dialysis catheter noted with distal tip at the cavoatrial junction.  No pneumothorax.  Stable faint interstitial accentuation noted.  Atherosclerotic calcification of the aortic arch is present.  Heart size within normal limits for technique. Prior right IJ line has been removed.  IMPRESSION:  1.  Replacement of the prior right IJ line with a left internal jugular dialysis catheter.  Distal tip at cavoatrial junction; no pneumothorax.   Original Report Authenticated By: Dellia Cloud, M.D.    Medications:      . amLODipine  10 mg Oral Daily  . aspirin  81 mg Oral Daily  . calcium acetate  667 mg Oral BID WC  . cloNIDine  0.2 mg Oral  BID  . darbepoetin (ARANESP) injection - DIALYSIS  60 mcg Intravenous Q Fri-HD  . insulin aspart  0-15 Units Subcutaneous TID WC  . insulin aspart  0-5 Units Subcutaneous QHS  . insulin glargine  12 Units Subcutaneous QHS  . isosorbide mononitrate  120 mg Oral Daily  . levothyroxine  25 mcg Oral Q0600  . metoprolol tartrate  25 mg Oral BID  . multivitamin  1 tablet Oral Daily  . pantoprazole  40 mg Oral Q1200  . simvastatin  10 mg Oral q1800  . vancomycin  750 mg Intravenous Q M,W,F-HD  . warfarin  3 mg Oral ONCE-1800  . Warfarin - Pharmacist Dosing Inpatient   Does not apply q1800  . DISCONTD: phytonadione (VITAMIN K) IV  5 mg Intravenous Once

## 2012-08-23 NOTE — Procedures (Signed)
I was present at this dialysis session. I have reviewed the session itself and made appropriate changes.   [name], MD [employer] 08/23/2012, 9:47 AM

## 2012-08-23 NOTE — Progress Notes (Addendum)
[facility]  Daily Progress Note  Assessment/Planning: POD #2 s/p TDC placement, POD #4 ligation of L BVT   Follow up in the office in 2-4 weeks for re-evaluation for next acess  Subjective  - 2 Days Post-Op  No c/o, left hand anesthesia resolving  Objective Filed Vitals:   08/23/12 0858 08/23/12 0910 08/23/12 0916 08/23/12 0938  BP: 102/54 100/53 150/64 138/65  Pulse: 86 85 86 86  Temp:   96.8 F (36 C) 97.9 F (36.6 C)  TempSrc:   Oral Oral  Resp: 16 16 18 18   Height:      Weight:   133 lb 2.5 oz (60.4 kg)   SpO2:   95% 96%    Intake/Output Summary (Last 24 hours) at 08/23/12 1145 Last data filed at 08/23/12 0916  Gross per 24 hour  Intake    300 ml  Output   2330 ml  Net  -2030 ml    PULM  CTAB, L TDC site without hematoma CV  RRR GI  soft, NTND VASC  LUE inc c/d/i, some fullness at antecubitum with drainage, warmer left hand, sensation and motor intact  Laboratory CBC    Component Value Date/Time   WBC 9.8 08/23/2012 0500   HGB 9.7* 08/23/2012 0500   HCT 29.6* 08/23/2012 0500   PLT 279 08/23/2012 0500    BMET    Component Value Date/Time   NA 130* 08/23/2012 0500   K 3.5 08/23/2012 0500   CL 92* 08/23/2012 0500   CO2 28 08/23/2012 0500   GLUCOSE 233* 08/23/2012 0500   BUN 31* 08/23/2012 0500   CREATININE 4.02* 08/23/2012 0500   CALCIUM 8.9 08/23/2012 0500   GFRNONAA 10* 08/23/2012 0500   GFRAA 12* 08/23/2012 0500    Leonides Sake, MD [facility] Office: [phone] Pager: [phone]  08/23/2012, 11:45 AM

## 2012-08-23 NOTE — Progress Notes (Signed)
Inpatient Diabetes Program Recommendations  AACE/ADA: New Consensus Statement on Inpatient Glycemic Control (2013)  Target Ranges:  Prepandial:   less than 140 mg/dL      Peak postprandial:   less than 180 mg/dL (1-2 hours)      Critically ill patients:  140 - 180 mg/dL   Results for [name] [name], [name] [name] (MRN [id number]) as of 08/23/2012 13:54  Ref. Range 08/22/2012 11:56 08/22/2012 17:24 08/22/2012 21:16 08/23/2012 09:35 08/23/2012 11:26  Glucose-Capillary Latest Range: 70-99 mg/dL 409 (H) 811 (H) 914 (H) 145 (H) 313 (H)    Inpatient Diabetes Program Recommendations Insulin - Basal: Increase Lantus to 15 units  Insulin - Meal Coverage: add Novolog 3 units with meals  Post prandial elevations may benefit from addition of Novolog with meals.  Thank you  Piedad Climes [facility] Inpatient Diabetes Coordinator [phone] 8a-4p [phone] after 4p

## 2012-08-23 NOTE — Discharge Summary (Signed)
Physician Discharge Summary  [name] [name] ZOX:[id number] DOB: 15-May-1941 DOA: 08/13/2012  PCP: Quentin Mulling, MD  Admit date: 08/13/2012 Discharge date: 08/23/2012  Recommendations for Outpatient Follow-up:      Follow-up Information    Follow up with HOOPER,[name], MD. (in 1-2weeks, call for appt)    Contact information:   [address] [place] [address] [phone]       Follow up with Nilda Simmer, MD. (in 2weeks as directed)    Contact information:   [address] [place] [address] [phone]          Discharge Diagnoses:  Active Problems:  Hypercholesterolemia  Hypothyroidism  HTN (hypertension)  CAD (coronary artery disease)  End stage renal disease  Gastroenteritis  Diabetes mellitus  Bacteremia   Discharge Condition: Improved/stable  Diet recommendation: Renal  Filed Weights   08/22/12 2114 08/23/12 0454 08/23/12 0916  Weight: 65.2 kg (143 lb 11.8 oz) 62.9 kg (138 lb 10.7 oz) 60.4 kg (133 lb 2.5 oz)    History of present illness:  This is a 71 year old female who lives at home with her son, she states that yesterday she started having nausea, vomiting, diarrhea. No evidence of hematemesis or blood in the stool. She had a fever and a mild cough productive of yellowish phlegm. She does have shortness of breath or wheezing however she states this is chronic and unchanged. She does urinate, she states she has no burning or urination. She states she's feeling weak. Her last diarrhea was yesterday but she did not vomit several times today including in the ER. Of note the patient states her son had similar symptoms have been of the week and she will be she may have contracted it from him.      Hospital Course:  1. Coagulase negative staph bacteremia secondary to line infection. As discussed above, upon admission blood cultures were obtained and patient was started on empiric antibiotics . The blood cultures grew coagulase negative staph  which was sensitive to vancomycin. The Source was suspected to be HD Catheter related with seeding during recent AVF placement. The patient defervesced and her leukocytosis resolved on vancomycin. Vascular surgery was consulted and they saw the patient that G. to her INR being elevated to have to wait until that became subtherapeutic-less than 0.17 on 10/17 and the dialysis catheter was removed. Repeat blood cultures were done on 10/14 and to date culture no growth. Vascular surgery followed with patient and in the dialysis catheter was placed on 10/19. Nephrology followed the patient and dialyzed her during this hospital stay. she is status post dialysis today and from renal and vascular standpoint okay for discharge. She is to followup with Dr. Imogene Burn in 2 weeks and to be determined if she is a  AV graft candidate. She'll be discharged today for outpatient followup and is to complete a total of 14 days of IV vancomycin at the dialysis per renal. 2. N/V/Diarrhea , resolved  -She had cdiff PCR done in the hospital and it came back negative. She has not had any further diarrhea.  3. ESRD on HD MWF per renal  --As discussed above she is to followup outpatient dialysis per renal as directed. 4. H/o PE on coumadin -coumadin resumed, INR still subtherapeutic, follow  5. COPD/Chronic resp failure on 2-3L home O2, -remained stable and her hospital stay. 6. DM: -She was maintained on Lantus with sliding scale coverage during this hospital stay and is to continue them upon discharge.  7. Anemia  of chronic disease; stable, Hb 10.7-11.4, recheck in am  8. HTN: stable, she was maintained on Amlodipine, clonidine, imdur, metoprolol is to continue this upon discharge. 9.Hyponatremia: Improved, secondary  to fluid shifts in this dialysis pt      Procedures:  Hemodialysis catheter removal on 10/17  New tunneled hemodialysis catheter placement on 10/19  Consultations:  Vascular surgery-Dr.  Imogene Burn  Nephrology-[place]  Discharge Exam: Filed Vitals:   08/23/12 0858 08/23/12 0910 08/23/12 0916 08/23/12 0938  BP: 102/54 100/53 150/64 138/65  Pulse: 86 85 86 86  Temp:   96.8 F (36 C) 97.9 F (36.6 C)  TempSrc:   Oral Oral  Resp: 16 16 18 18   Height:      Weight:   60.4 kg (133 lb 2.5 oz)   SpO2:   95% 96%    Physical Exam:  General: Alert, awake, oriented x3, in no acute distress.  HEENT: No bruits, no goiter.  Heart: Regular rate and rhythm, without murmurs, rubs, gallops.  Lungs: Clear to auscultation bilaterally.  Abdomen: Soft, nontender, nondistended, positive bowel sounds.  Extremities: No clubbing cyanosis or edema with positive pedal pulses  Neuro: Grossly intact, nonfocal.      Discharge Instructions  Discharge Orders    Future Orders Please Complete By Expires   Diet renal 60/70-12-05-1198      Increase activity slowly          Medication List     As of 08/23/2012  3:24 PM    TAKE these medications         amLODipine 10 MG tablet   Commonly known as: NORVASC   Take 10 mg by mouth daily.      aspirin 81 MG chewable tablet   Chew 81 mg by mouth daily.      calcium acetate 667 MG capsule   Commonly known as: PHOSLO   Take 667 mg by mouth 2 (two) times daily.      cloNIDine 0.2 MG tablet   Commonly known as: CATAPRES   Take 0.2 mg by mouth 2 (two) times daily.      HYDROcodone-acetaminophen 5-325 MG per tablet   Commonly known as: NORCO/VICODIN   Take 1 tablet by mouth every 4 (four) hours as needed.      insulin glargine 100 UNIT/ML injection   Commonly known as: LANTUS   Inject 18 Units into the skin at bedtime.      INSULIN SYRINGE .5CC/28G 28G X 1/2" 0.5 ML Misc   daily.      isosorbide mononitrate 120 MG 24 hr tablet   Commonly known as: IMDUR   Take 120 mg by mouth daily.      levothyroxine 25 MCG tablet   Commonly known as: SYNTHROID, LEVOTHROID   Take 25 mcg by mouth daily.      metoprolol tartrate 25 MG  tablet   Commonly known as: LOPRESSOR   Take 25 mg by mouth 2 (two) times daily.      multivitamin Tabs tablet   Take 1 tablet by mouth daily.      nitroGLYCERIN 0.4 MG SL tablet   Commonly known as: NITROSTAT   Place 0.4 mg under the tongue every 5 (five) minutes as needed. For chest pain.      pantoprazole 40 MG tablet   Commonly known as: PROTONIX   Take 40 mg by mouth daily.      PROBIOTIC PO   Take 1 capsule by mouth daily.  simvastatin 10 MG tablet   Commonly known as: ZOCOR   Take 10 mg by mouth daily at 6 PM.      warfarin 3 MG tablet   Commonly known as: COUMADIN   Take 1 tablet (3 mg total) by mouth one time only at 6 PM.           Follow-up Information    Follow up with HOOPER,[name], MD. (in 1-2weeks, call for appt)    Contact information:   [address] [place] [address] [phone]       Follow up with Nilda Simmer, MD. (in 2weeks as directed)    Contact information:   [address] [place] [address] [phone]           The results of significant diagnostics from this hospitalization (including imaging, microbiology, ancillary and laboratory) are listed below for reference.    Significant Diagnostic Studies: Dg Chest 2 View  08/13/2012  *RADIOLOGY REPORT*  Clinical Data: Influenza.  Fever.  Shortness of breath.  Nausea vomiting.  CHEST - 2 VIEW  Comparison: 04/06/2012  Findings: Heart size is within normal limits.  A tiny right pleural effusion is seen with minimal atelectasis or infiltrate in the posterior right lung base.  Left lung remains clear.  No mass or lymphadenopathy identified.  Right jugular dual lumen center venous catheter remains in appropriate position.  IMPRESSION: Tiny right pleural effusion and minimal atelectasis or infiltrate in the posterior right lung base.   Original Report Authenticated By: Danae Orleans, M.D.    Dg Chest Port 1 View  08/21/2012  *RADIOLOGY REPORT*  Clinical Data: Central line  insertion.  Diabetes.  PORTABLE CHEST - 1 VIEW  Comparison: 08/13/2012  Findings: Left IJ dialysis catheter noted with distal tip at the cavoatrial junction.  No pneumothorax.  Stable faint interstitial accentuation noted.  Atherosclerotic calcification of the aortic arch is present.  Heart size within normal limits for technique. Prior right IJ line has been removed.  IMPRESSION:  1.  Replacement of the prior right IJ line with a left internal jugular dialysis catheter.  Distal tip at cavoatrial junction; no pneumothorax.   Original Report Authenticated By: Dellia Cloud, M.D.    Dg Fluoro Guide Cv Line-no Report  08/21/2012  CLINICAL DATA: surgery   FLOURO GUIDE CV LINE  Fluoroscopy was utilized by the requesting physician.  No radiographic  interpretation.      Microbiology: Recent Results (from the past 240 hour(s))  CULTURE, BLOOD (ROUTINE X 2)     Status: Normal   Collection Time   08/13/12  8:30 PM      Component Value Range Status Comment   Specimen Description BLOOD ARM RIGHT   Final    Special Requests BOTTLES DRAWN AEROBIC AND ANAEROBIC 10CC   Final    Culture  Setup Time 08/14/2012 01:08   Final    Culture     Final    Value: STAPHYLOCOCCUS SPECIES (COAGULASE NEGATIVE)     Note: SUSCEPTIBILITIES PERFORMED ON PREVIOUS CULTURE WITHIN THE LAST 5 DAYS.     Note: Gram Stain Report Called to,Read Back By and Verified With: Minna Merritts RN on 08/15/12 at 01:25 by Christie Nottingham   Report Status 08/16/2012 FINAL   Final   CULTURE, BLOOD (ROUTINE X 2)     Status: Normal   Collection Time   08/13/12  8:40 PM      Component Value Range Status Comment   Specimen Description BLOOD RIGHT HAND  Final    Special Requests BOTTLES DRAWN AEROBIC AND ANAEROBIC 10CC   Final    Culture  Setup Time 08/14/2012 01:08   Final    Culture     Final    Value: STAPHYLOCOCCUS SPECIES (COAGULASE NEGATIVE)     Note: RIFAMPIN AND GENTAMICIN SHOULD NOT BE USED AS SINGLE DRUGS FOR TREATMENT OF STAPH  INFECTIONS.     Note: Gram Stain Report Called to,Read Back By and Verified With: KEJI ODEDERE 08/14/12 @ 8:45PM BY RUSCA.   Report Status 08/16/2012 FINAL   Final    Organism ID, Bacteria STAPHYLOCOCCUS SPECIES (COAGULASE NEGATIVE)   Final   CLOSTRIDIUM DIFFICILE BY PCR     Status: Normal   Collection Time   08/14/12 10:03 PM      Component Value Range Status Comment   C difficile by pcr NEGATIVE  NEGATIVE Final   CULTURE, BLOOD (ROUTINE X 2)     Status: Normal   Collection Time   08/16/12 12:20 PM      Component Value Range Status Comment   Specimen Description BLOOD RIGHT ARM   Final    Special Requests BOTTLES DRAWN AEROBIC ONLY [facility]   Final    Culture  Setup Time 08/16/2012 18:29   Final    Culture NO GROWTH 5 DAYS   Final    Report Status 08/22/2012 FINAL   Final   CULTURE, BLOOD (ROUTINE X 2)     Status: Normal   Collection Time   08/16/12 12:30 PM      Component Value Range Status Comment   Specimen Description BLOOD RIGHT HAND   Final    Special Requests BOTTLES DRAWN AEROBIC ONLY 3CC   Final    Culture  Setup Time 08/16/2012 18:29   Final    Culture NO GROWTH 5 DAYS   Final    Report Status 08/22/2012 FINAL   Final   MRSA PCR SCREENING     Status: Normal   Collection Time   08/16/12  2:29 PM      Component Value Range Status Comment   MRSA by PCR NEGATIVE  NEGATIVE Final   CATH TIP CULTURE     Status: Normal   Collection Time   08/19/12  9:12 AM      Component Value Range Status Comment   Specimen Description CATH TIP   Final    Special Requests DIALYSIS CATH   Final    Culture NO GROWTH 2 DAYS   Final    Report Status 08/22/2012 FINAL   Final      Labs: Basic Metabolic Panel:  Lab 08/23/12 1610 08/22/12 0539 08/21/12 0540 08/20/12 0540 08/19/12 1427 08/18/12 0711  NA 130* 136 128* 136 138 --  K 3.5 3.5 4.3 3.6 3.6 --  CL 92* 98 88* 94* 95* --  CO2 28 29 28 30 30  --  GLUCOSE 233* 226* 220* 163* 108* --  BUN 31* 16 41* 27* 21 --  CREATININE 4.02* 2.72*  5.05* 4.55* 3.50* --  CALCIUM 8.9 8.9 8.7 8.9 9.2 --  MG -- -- -- -- -- --  PHOS 4.1 -- 3.5 -- -- 3.2   Liver Function Tests:  Lab 08/23/12 0500 08/21/12 0540 08/18/12 0711  AST -- -- --  ALT -- -- --  ALKPHOS -- -- --  BILITOT -- -- --  PROT -- -- --  ALBUMIN 2.9* 2.8* 2.6*   No results found for this basename: LIPASE:5,AMYLASE:5 in the last 168 hours No results  found for this basename: AMMONIA:5 in the last 168 hours CBC:  Lab 08/23/12 0500 08/21/12 0540 08/20/12 0540 08/19/12 1427 08/18/12 0711  WBC 9.8 9.4 9.5 10.1 8.4  NEUTROABS -- -- -- -- --  HGB 9.7* 10.1* 10.5* 11.7* 10.1*  HCT 29.6* 30.2* 32.6* 35.6* 31.3*  MCV 86.8 86.5 87.2 87.3 86.5  PLT 279 322 314 306 230   Cardiac Enzymes: No results found for this basename: CKTOTAL:5,CKMB:5,CKMBINDEX:5,TROPONINI:5 in the last 168 hours BNP: BNP (last 3 results)  Basename 02/24/12 0640 02/24/12 0100 02/06/12 0407  PROBNP 31044.0* 24313.0* 10121.0*   CBG:  Lab 08/23/12 1126 08/23/12 0935 08/22/12 2116 08/22/12 1724 08/22/12 1156  GLUCAP 313* 145* 232* 300* 196*    Time coordinating discharge: >30 minutes  Signed:  Lovelace Cerveny C  [facility] 08/23/2012, 3:24 PM

## 2012-08-24 ENCOUNTER — Encounter (HOSPITAL_COMMUNITY): Payer: Self-pay | Admitting: Vascular Surgery

## 2012-08-24 ENCOUNTER — Telehealth: Payer: Self-pay | Admitting: Vascular Surgery

## 2012-08-24 MED FILL — Heparin Sodium (Porcine) Inj 1000 Unit/ML: INTRAMUSCULAR | Qty: 1.3 | Status: AC

## 2012-08-24 NOTE — Telephone Encounter (Addendum)
Message copied by Shari Prows on Tue Aug 24, 2012  1:40 PM ------      Message from: Melene Plan      Created: Mon Aug 23, 2012 11:57 AM                   ----- Message -----         From: Fransisco Hertz, MD         Sent: 08/23/2012  11:47 AM           To: Melene Plan, RN            RAVONDA BRECHEEN      [id number]      [birth year]            Follow-up: 2-4 weeks            Orders(s) for follow-up: BLE ABI, B aortoiliac duplex        I scheduled an appointment for this patient on 09/24/12 at 10:30am for lab studies and to see BLC at 12:45pm. The pt is aware of the appts and I will also mail a letter to her. I contacted her dialysis facility so they can work around her appt here. awt

## 2012-08-27 ENCOUNTER — Ambulatory Visit: Payer: Medicare Other | Admitting: Vascular Surgery

## 2012-08-31 LAB — POCT I-STAT 4, (NA,K, GLUC, HGB,HCT): Hemoglobin: 10.2 g/dL — ABNORMAL LOW (ref 12.0–15.0)

## 2012-09-23 ENCOUNTER — Other Ambulatory Visit: Payer: Medicare Other

## 2012-09-24 ENCOUNTER — Ambulatory Visit: Payer: Medicare Other | Admitting: Vascular Surgery

## 2012-09-24 ENCOUNTER — Other Ambulatory Visit: Payer: Medicare Other

## 2013-03-09 ENCOUNTER — Encounter (HOSPITAL_COMMUNITY): Payer: Self-pay | Admitting: Emergency Medicine

## 2013-03-09 ENCOUNTER — Emergency Department (HOSPITAL_COMMUNITY): Payer: Medicare Other

## 2013-03-09 ENCOUNTER — Inpatient Hospital Stay (HOSPITAL_COMMUNITY)
Admission: EM | Admit: 2013-03-09 | Discharge: 2013-03-11 | DRG: 693 | Disposition: A | Discharge status: Home / Self Care | Payer: Medicare Other | Attending: Emergency Medicine | Admitting: Emergency Medicine

## 2013-03-09 DIAGNOSIS — N189 Chronic kidney disease, unspecified: Secondary | ICD-10-CM

## 2013-03-09 DIAGNOSIS — Z87891 Personal history of nicotine dependence: Secondary | ICD-10-CM

## 2013-03-09 DIAGNOSIS — Z86711 Personal history of pulmonary embolism: Secondary | ICD-10-CM

## 2013-03-09 DIAGNOSIS — N200 Calculus of kidney: Secondary | ICD-10-CM

## 2013-03-09 DIAGNOSIS — E7800 Pure hypercholesterolemia, unspecified: Secondary | ICD-10-CM | POA: Diagnosis present

## 2013-03-09 DIAGNOSIS — I2510 Atherosclerotic heart disease of native coronary artery without angina pectoris: Secondary | ICD-10-CM

## 2013-03-09 DIAGNOSIS — E785 Hyperlipidemia, unspecified: Secondary | ICD-10-CM | POA: Diagnosis present

## 2013-03-09 DIAGNOSIS — Z992 Dependence on renal dialysis: Secondary | ICD-10-CM

## 2013-03-09 DIAGNOSIS — I10 Essential (primary) hypertension: Secondary | ICD-10-CM | POA: Diagnosis present

## 2013-03-09 DIAGNOSIS — Z794 Long term (current) use of insulin: Secondary | ICD-10-CM

## 2013-03-09 DIAGNOSIS — I120 Hypertensive chronic kidney disease with stage 5 chronic kidney disease or end stage renal disease: Secondary | ICD-10-CM | POA: Diagnosis present

## 2013-03-09 DIAGNOSIS — E119 Type 2 diabetes mellitus without complications: Secondary | ICD-10-CM

## 2013-03-09 DIAGNOSIS — N2581 Secondary hyperparathyroidism of renal origin: Secondary | ICD-10-CM | POA: Diagnosis present

## 2013-03-09 DIAGNOSIS — N390 Urinary tract infection, site not specified: Secondary | ICD-10-CM

## 2013-03-09 DIAGNOSIS — D649 Anemia, unspecified: Secondary | ICD-10-CM | POA: Diagnosis present

## 2013-03-09 DIAGNOSIS — K219 Gastro-esophageal reflux disease without esophagitis: Secondary | ICD-10-CM | POA: Diagnosis present

## 2013-03-09 DIAGNOSIS — E1129 Type 2 diabetes mellitus with other diabetic kidney complication: Secondary | ICD-10-CM | POA: Diagnosis present

## 2013-03-09 DIAGNOSIS — E871 Hypo-osmolality and hyponatremia: Secondary | ICD-10-CM | POA: Diagnosis present

## 2013-03-09 DIAGNOSIS — R319 Hematuria, unspecified: Secondary | ICD-10-CM | POA: Diagnosis present

## 2013-03-09 DIAGNOSIS — I5042 Chronic combined systolic (congestive) and diastolic (congestive) heart failure: Secondary | ICD-10-CM | POA: Diagnosis present

## 2013-03-09 DIAGNOSIS — N1330 Unspecified hydronephrosis: Secondary | ICD-10-CM | POA: Diagnosis present

## 2013-03-09 DIAGNOSIS — E039 Hypothyroidism, unspecified: Secondary | ICD-10-CM | POA: Diagnosis present

## 2013-03-09 DIAGNOSIS — N201 Calculus of ureter: Principal | ICD-10-CM | POA: Diagnosis present

## 2013-03-09 DIAGNOSIS — N186 End stage renal disease: Secondary | ICD-10-CM

## 2013-03-09 LAB — BASIC METABOLIC PANEL
Chloride: 92 mEq/L — ABNORMAL LOW (ref 96–112)
GFR calc Af Amer: 10 mL/min — ABNORMAL LOW (ref >90)
GFR calc non Af Amer: 9 mL/min — ABNORMAL LOW (ref >90)
Glucose, Bld: 306 mg/dL — ABNORMAL HIGH (ref 70–99)
Potassium: 5 mEq/L (ref 3.5–5.1)
Sodium: 130 mEq/L — ABNORMAL LOW (ref 135–145)

## 2013-03-09 LAB — CBC
HCT: 35.1 % — ABNORMAL LOW (ref 36.0–46.0)
Hemoglobin: 12.8 g/dL (ref 12.0–15.0)
MCHC: 33.9 g/dL (ref 30.0–36.0)
MCV: 84.6 fL (ref 78.0–100.0)
RDW: 14.2 % (ref 11.5–15.5)
WBC: 15.3 10*3/uL — ABNORMAL HIGH (ref 4.0–10.5)
WBC: 13.6 10*3/uL — ABNORMAL HIGH (ref 4.0–10.5)

## 2013-03-09 LAB — URINE MICROSCOPIC-ADD ON

## 2013-03-09 LAB — TSH: TSH: 1.935 u[IU]/mL (ref 0.350–4.500)

## 2013-03-09 LAB — PROTIME-INR
INR: 0.96 (ref 0.00–1.49)
Prothrombin Time: 12.7 seconds (ref 11.6–15.2)

## 2013-03-09 LAB — URINALYSIS, ROUTINE W REFLEX MICROSCOPIC
Nitrite: NEGATIVE
Specific Gravity, Urine: 1.01 (ref 1.005–1.030)
pH: 7 (ref 5.0–8.0)

## 2013-03-09 LAB — GLUCOSE, CAPILLARY
Glucose-Capillary: 122 mg/dL — ABNORMAL HIGH (ref 70–99)
Glucose-Capillary: 149 mg/dL — ABNORMAL HIGH (ref 70–99)

## 2013-03-09 LAB — MAGNESIUM: Magnesium: 2.1 mg/dL (ref 1.5–2.5)

## 2013-03-09 LAB — CREATININE, SERUM: GFR calc Af Amer: 12 mL/min — ABNORMAL LOW (ref >90)

## 2013-03-09 MED ORDER — AMLODIPINE BESYLATE 10 MG PO TABS
10.0000 mg | ORAL_TABLET | Freq: Every day | ORAL | Status: DC
  Administered 2013-03-09 – 2013-03-11 (×3): 10 mg via ORAL
  Filled 2013-03-09 (×3): qty 1

## 2013-03-09 MED ORDER — CAMPHOR-MENTHOL 0.5-0.5 % EX LOTN
1.0000 "application " | TOPICAL_LOTION | Freq: Three times a day (TID) | CUTANEOUS | Status: DC | PRN
  Filled 2013-03-09: qty 222

## 2013-03-09 MED ORDER — NEPRO/CARBSTEADY PO LIQD
237.0000 mL | Freq: Three times a day (TID) | ORAL | Status: DC | PRN
  Filled 2013-03-09: qty 237

## 2013-03-09 MED ORDER — ENOXAPARIN SODIUM 30 MG/0.3ML ~~LOC~~ SOLN
30.0000 mg | SUBCUTANEOUS | Status: DC
  Administered 2013-03-09 – 2013-03-11 (×3): 30 mg via SUBCUTANEOUS
  Filled 2013-03-09 (×3): qty 0.3

## 2013-03-09 MED ORDER — ISOSORBIDE MONONITRATE ER 60 MG PO TB24
120.0000 mg | ORAL_TABLET | Freq: Every day | ORAL | Status: DC
  Administered 2013-03-09 – 2013-03-11 (×3): 120 mg via ORAL
  Filled 2013-03-09 (×3): qty 2

## 2013-03-09 MED ORDER — ONDANSETRON HCL 4 MG/2ML IJ SOLN: 4.0000 mg | Freq: Four times a day (QID) | INTRAMUSCULAR | Status: DC | PRN

## 2013-03-09 MED ORDER — PANTOPRAZOLE SODIUM 40 MG PO TBEC
40.0000 mg | DELAYED_RELEASE_TABLET | Freq: Every day | ORAL | Status: DC
  Administered 2013-03-10 – 2013-03-11 (×2): 40 mg via ORAL
  Filled 2013-03-09 (×3): qty 1

## 2013-03-09 MED ORDER — METOPROLOL TARTRATE 12.5 MG HALF TABLET
12.5000 mg | ORAL_TABLET | Freq: Every day | ORAL | Status: DC
  Administered 2013-03-09 – 2013-03-11 (×3): 12.5 mg via ORAL
  Filled 2013-03-09 (×3): qty 1

## 2013-03-09 MED ORDER — DOCUSATE SODIUM 283 MG RE ENEM
1.0000 | ENEMA | RECTAL | Status: DC | PRN
  Filled 2013-03-09: qty 1

## 2013-03-09 MED ORDER — SORBITOL 70 % SOLN: 30.0000 mL | Freq: Every day | Status: DC | PRN

## 2013-03-09 MED ORDER — ACETAMINOPHEN 650 MG RE SUPP: 650.0000 mg | Freq: Four times a day (QID) | RECTAL | Status: DC | PRN

## 2013-03-09 MED ORDER — ASPIRIN EC 81 MG PO TBEC
81.0000 mg | DELAYED_RELEASE_TABLET | Freq: Every day | ORAL | Status: DC
  Administered 2013-03-09 – 2013-03-11 (×3): 81 mg via ORAL
  Filled 2013-03-09 (×3): qty 1

## 2013-03-09 MED ORDER — SORBITOL 70 % SOLN
30.0000 mL | Status: DC | PRN
  Filled 2013-03-09: qty 30

## 2013-03-09 MED ORDER — LEVOTHYROXINE SODIUM 25 MCG PO TABS
25.0000 ug | ORAL_TABLET | Freq: Every day | ORAL | Status: DC
  Administered 2013-03-10 – 2013-03-11 (×2): 25 ug via ORAL
  Filled 2013-03-09 (×3): qty 1

## 2013-03-09 MED ORDER — SODIUM CHLORIDE 0.9 % IV SOLN: 62.5000 mg | INTRAVENOUS | Status: DC

## 2013-03-09 MED ORDER — DEXTROSE 5 % IV SOLN
1.0000 g | Freq: Once | INTRAVENOUS | Status: AC
  Administered 2013-03-09: 1 g via INTRAVENOUS
  Filled 2013-03-09: qty 10

## 2013-03-09 MED ORDER — ZOLPIDEM TARTRATE 5 MG PO TABS: 5.0000 mg | ORAL_TABLET | Freq: Every evening | ORAL | Status: DC | PRN

## 2013-03-09 MED ORDER — CEFTRIAXONE SODIUM 1 G IJ SOLR
1.0000 g | Freq: Once | INTRAMUSCULAR | Status: DC
  Filled 2013-03-09: qty 10

## 2013-03-09 MED ORDER — INSULIN GLARGINE 100 UNIT/ML ~~LOC~~ SOLN
24.0000 [IU] | Freq: Every day | SUBCUTANEOUS | Status: DC
  Administered 2013-03-09 – 2013-03-10 (×2): 24 [IU] via SUBCUTANEOUS
  Filled 2013-03-09 (×3): qty 0.24

## 2013-03-09 MED ORDER — CALCIUM CARBONATE 1250 MG/5ML PO SUSP
500.0000 mg | Freq: Four times a day (QID) | ORAL | Status: DC | PRN
  Filled 2013-03-09: qty 5

## 2013-03-09 MED ORDER — SIMVASTATIN 10 MG PO TABS
10.0000 mg | ORAL_TABLET | Freq: Every evening | ORAL | Status: DC
  Administered 2013-03-09 – 2013-03-11 (×3): 10 mg via ORAL
  Filled 2013-03-09 (×3): qty 1

## 2013-03-09 MED ORDER — NITROGLYCERIN 0.4 MG SL SUBL: 0.4000 mg | SUBLINGUAL_TABLET | SUBLINGUAL | Status: DC | PRN

## 2013-03-09 MED ORDER — DEXTROSE 5 % IV SOLN
1.0000 g | INTRAVENOUS | Status: DC
  Administered 2013-03-09 – 2013-03-10 (×2): 1 g via INTRAVENOUS
  Filled 2013-03-09 (×3): qty 10

## 2013-03-09 MED ORDER — SODIUM CHLORIDE 0.9 % IV SOLN: INTRAVENOUS | Status: AC

## 2013-03-09 MED ORDER — ACETAMINOPHEN 325 MG PO TABS: 650.0000 mg | ORAL_TABLET | Freq: Four times a day (QID) | ORAL | Status: DC | PRN

## 2013-03-09 MED ORDER — ONDANSETRON HCL 4 MG/2ML IJ SOLN: 4.0000 mg | Freq: Three times a day (TID) | INTRAMUSCULAR | Status: DC | PRN

## 2013-03-09 MED ORDER — CALCIUM ACETATE 667 MG PO CAPS
667.0000 mg | ORAL_CAPSULE | Freq: Three times a day (TID) | ORAL | Status: DC
  Administered 2013-03-10 – 2013-03-11 (×5): 667 mg via ORAL
  Filled 2013-03-09 (×8): qty 1

## 2013-03-09 MED ORDER — INSULIN ASPART 100 UNIT/ML ~~LOC~~ SOLN
0.0000 [IU] | Freq: Three times a day (TID) | SUBCUTANEOUS | Status: DC
  Administered 2013-03-09: 5 [IU] via SUBCUTANEOUS
  Administered 2013-03-09: 2 [IU] via SUBCUTANEOUS
  Administered 2013-03-10: 7 [IU] via SUBCUTANEOUS
  Administered 2013-03-10: 2 [IU] via SUBCUTANEOUS
  Administered 2013-03-10: 7 [IU] via SUBCUTANEOUS
  Administered 2013-03-11 (×2): 2 [IU] via SUBCUTANEOUS
  Filled 2013-03-09: qty 1

## 2013-03-09 MED ORDER — CLONIDINE HCL 0.2 MG PO TABS
0.2000 mg | ORAL_TABLET | Freq: Two times a day (BID) | ORAL | Status: DC
  Administered 2013-03-09: 0.2 mg via ORAL
  Filled 2013-03-09 (×3): qty 1

## 2013-03-09 MED ORDER — ONDANSETRON HCL 4 MG PO TABS: 4.0000 mg | ORAL_TABLET | Freq: Four times a day (QID) | ORAL | Status: DC | PRN

## 2013-03-09 MED ORDER — POLYETHYLENE GLYCOL 3350 17 G PO PACK
17.0000 g | PACK | Freq: Every day | ORAL | Status: DC | PRN
  Filled 2013-03-09: qty 1

## 2013-03-09 MED ORDER — HYDROXYZINE HCL 25 MG PO TABS
25.0000 mg | ORAL_TABLET | Freq: Three times a day (TID) | ORAL | Status: DC | PRN
  Filled 2013-03-09: qty 1

## 2013-03-09 MED ORDER — ONDANSETRON HCL 4 MG PO TABS
4.0000 mg | ORAL_TABLET | Freq: Four times a day (QID) | ORAL | Status: DC | PRN
  Administered 2013-03-11: 4 mg via ORAL
  Filled 2013-03-09: qty 1

## 2013-03-09 MED ORDER — HYDROMORPHONE HCL PF 1 MG/ML IJ SOLN: 1.0000 mg | INTRAMUSCULAR | Status: AC | PRN

## 2013-03-09 NOTE — Procedures (Signed)
I was present at this dialysis session. I have reviewed the session itself and made appropriate changes.   [name], MD [employer] 03/09/2013, 5:31 PM

## 2013-03-09 NOTE — ED Notes (Signed)
Pt made aware the urologist has not called back and had to be repaged. She may be admitted here or transferred to [facility].

## 2013-03-09 NOTE — ED Notes (Signed)
Ordered Renal Diet

## 2013-03-09 NOTE — Progress Notes (Signed)
Pt admitted to unit 6700 from the ED. Pt is alert and oriented to staff, call bell, and room. Bed in lowest position. Call bell within reach. Full assessment to Epic. Will continue to monitor. Claira Jeter, RN   

## 2013-03-09 NOTE — ED Notes (Signed)
Patient transported to CT 

## 2013-03-09 NOTE — H&P (Signed)
[facility] History and Physical  [name] [name] ZOX:[id number] DOB: [birth year] DOA: 03/09/2013  Referring physician: Dr Preston Fleeting PCP: Quentin Mulling, MD  Specialists: Nephrology: Dr Caryn Section  Chief Complaint: Abdominal pain  HPI: [name] [name] is a 72 y.o. female with past medical history of end-stage renal disease on hemodialysis Monday Wednesday Friday and aspirin started approximately 16 more months ago per patient, history of diabetes mellitus, hyperlipidemia, hypothyroidism, hypertension, gastroesophageal reflux disease, coronary artery disease, COPD who presents to the ED with a one-day history of suprapubic lower abdominal pain. Left sided flank pain x1 day with no radiation. Patient also endorses some hematuria. Patient denies any fever, no chills, no nausea, no vomiting, no chest pain, no shortness of breath, no cough, no hematemesis, no hematochezia, no melena, no weakness. Patient subsequently presented to the ED where basic metabolic profile obtained it have a sodium of 130 chloride of 92 urine of 43 creatinine of 4.74 and a glucose of 306. CBC had a white count of 15.3 otherwise was within normal limits. Urinalysis which was done was cloudy with large leukocytes, negative nitrite, large hemoglobin, 11-20 WBCs, too numerous to count RBCs. Renal ultrasound which was done showed a new mild to moderate left hydronephrosis and chronic right renal atrophic. CT of the abdomen and pelvis done shows significant hydronephrosis of the left kidney, a ureteral calculus may have already passed into the bladder with a density noted in the right portion of the bladder. Right kidney showed chronic atrophic. Patient was given a dose of IV Rocephin. Neurology was consulted. ED physician stated he spoke with Dr. Hillis Range who agreed to consult on the patient. We will called to admit the patient for further evaluation and management.  Review of Systems: The patient denies anorexia, fever, weight loss,, vision loss,  decreased hearing, hoarseness, chest pain, syncope, dyspnea on exertion, peripheral edema, balance deficits, hemoptysis, abdominal pain, melena, hematochezia, severe indigestion/heartburn, hematuria, incontinence, genital sores, muscle weakness, suspicious skin lesions, transient blindness, difficulty walking, depression, unusual weight change, abnormal bleeding, enlarged lymph nodes, angioedema, and breast masses.   Past Medical History  Diagnosis Date  . Diabetes mellitus   . Hypercholesterolemia   . Hypothyroidism   . Colitis   . Coronary artery disease   . Hypertension   . GERD (gastroesophageal reflux disease)   . Myocardial infarction   . Peripheral vascular disease   . Pneumonia   . Angina   . H/O Clostridium difficile infection   . Hx of pulmonary embolus   . COPD (chronic obstructive pulmonary disease)   . Shortness of breath     uses O2 as needed  . Chronic kidney disease     M-W-F   [place]   Past Surgical History  Procedure Laterality Date  . Tubal ligation    . Amputaton of right fifth toe    . Tonsillectomy    . Insertion of dialysis catheter    . Av fistula placement  [id number]    right RUE AVF  . Bvt  May 18, 2012    First stage, Basilic Vein Transposition  . Bvt  07/27/12    Left arm Basilic Vein Transposition  . Arch aortogram  08/19/2012    Procedure: ARCH AORTOGRAM;  Surgeon: Fransisco Hertz, MD;  Location: [facility] OR;  Service: Vascular;  Laterality: N/A;  . Insertion of dialysis catheter  08/21/2012    Procedure: INSERTION OF DIALYSIS CATHETER;  Surgeon: Sherren Kerns, MD;  Location: [facility] OR;  Service: Vascular;  Laterality:  Left;  internal jugular   Social History:  reports that she quit smoking about 20 months ago. Her smoking use included Cigarettes. She has a 40 pack-year smoking history. She has never used smokeless tobacco. She reports that she does not drink alcohol or use illicit drugs.  Allergies  Allergen Reactions  . Procaine Hcl Nausea And Vomiting  and Other (See Comments)    "Pass out"    Family History  Problem Relation Age of Onset  . Diabetes type II    . Hypertension    . Diabetes Mother   . Kidney disease Mother   . Diabetes Father   . Anesthesia problems Neg Hx     Prior to Admission medications   Medication Sig Start Date End Date Taking? Authorizing Provider  amLODipine (NORVASC) 10 MG tablet Take 10 mg by mouth daily.  04/20/12  Yes Historical Provider, MD  aspirin EC 81 MG tablet Take 81 mg by mouth daily.   Yes Historical Provider, MD  calcium acetate (PHOSLO) 667 MG capsule Take 667 mg by mouth 3 (three) times daily with meals.   Yes Historical Provider, MD  cloNIDine (CATAPRES) 0.2 MG tablet Take 0.2 mg by mouth 2 (two) times daily.   Yes Historical Provider, MD  insulin glargine (LANTUS) 100 UNIT/ML injection Inject 24 Units into the skin at bedtime.  11/01/11  Yes Clydia Llano, MD  insulin NPH (HUMULIN N,NOVOLIN N) 100 UNIT/ML injection Inject into the skin 4 (four) times daily - after meals and at bedtime. Sliding scale   Yes Historical Provider, MD  isosorbide mononitrate (IMDUR) 120 MG 24 hr tablet Take 120 mg by mouth daily.    Yes Historical Provider, MD  levothyroxine (SYNTHROID, LEVOTHROID) 25 MCG tablet Take 25 mcg by mouth daily.    Yes Historical Provider, MD  metoprolol tartrate (LOPRESSOR) 25 MG tablet Take 12.5 mg by mouth daily.   Yes Historical Provider, MD  nitroGLYCERIN (NITROSTAT) 0.4 MG SL tablet Place 0.4 mg under the tongue every 5 (five) minutes as needed. For chest pain.   Yes Historical Provider, MD  pantoprazole (PROTONIX) 40 MG tablet Take 40 mg by mouth daily.    Yes Historical Provider, MD  Probiotic Product (PROBIOTIC PO) Take 1 capsule by mouth daily.   Yes Historical Provider, MD  simvastatin (ZOCOR) 10 MG tablet Take 10 mg by mouth every evening.   Yes Historical Provider, MD   Physical Exam: Filed Vitals:   03/09/13 0900 03/09/13 0915 03/09/13 0930 03/09/13 1000  BP: 184/65 156/67  188/67 184/66  Pulse: 86 82 89 86  Temp:      TempSrc:      Resp:      SpO2: 98% 97% 93% 96%     General:  Well-developed well-nourished in no acute cardiopulmonary distress.  Eyes: Pupils equal round and reactive to light and accommodation. Extraocular movements intact.  ENT: Oropharynx is clear, no lesions, no exudates.  Neck: Supple with no lymphadenopathy. No JVD.  Cardiovascular: Regular rate rhythm no murmurs rubs or gallops. HD catheter in place in the left upper chest.  Respiratory: Clear to auscultation bilaterally. No wheezing, no crackles, no rhonchi.  Abdomen: Soft, nontender, nondistended, positive bowel sounds. No CVA tenderness.  Skin: No rashes or lesions.  Musculoskeletal: 5/5 BUE strength, 5/5 BLE strength  Psychiatric: Normal mood. Normal affect. Good insight. Good judgment.  Neurologic: Alert and oriented x3. Cranial nerves II through XII are grossly intact.  Labs on Admission:  Basic Metabolic Panel:  Recent  Labs Lab 03/09/13 0413  NA 130*  K 5.0  CL 92*  CO2 26  GLUCOSE 306*  BUN 43*  CREATININE 4.74*  CALCIUM 9.0   Liver Function Tests: No results found for this basename: AST, ALT, ALKPHOS, BILITOT, PROT, ALBUMIN,  in the last 168 hours No results found for this basename: LIPASE, AMYLASE,  in the last 168 hours No results found for this basename: AMMONIA,  in the last 168 hours CBC:  Recent Labs Lab 03/09/13 0413  WBC 15.3*  HGB 12.8  HCT 37.8  MCV 85.3  PLT 244   Cardiac Enzymes: No results found for this basename: CKTOTAL, CKMB, CKMBINDEX, TROPONINI,  in the last 168 hours  BNP (last 3 results) No results found for this basename: PROBNP,  in the last 8760 hours CBG: No results found for this basename: GLUCAP,  in the last 168 hours  Radiological Exams on Admission: Ct Abdomen Pelvis Wo Contrast  03/09/2013  *RADIOLOGY REPORT*  Clinical Data: Left-sided abdominal pain, nausea and vomiting.  CT ABDOMEN AND PELVIS WITHOUT  CONTRAST  Technique:  Multidetector CT imaging of the abdomen and pelvis was performed following the standard protocol without intravenous contrast.  Comparison: Prior CT performed at [facility] on 10/19/2007.  Findings: There is evidence of significant left-sided hydronephrosis and hydroureter with associated perinephric stranding and fluid.  As the ureter is followed into the pelvis, an obstructing calculus is not identified.  There is a density noted in the right portion of the base of the bladder which may represent a passed ureteral calculus.  The right kidney remains tiny and atrophic.  Unenhanced appearance of the liver, gallbladder, spleen and pancreas are unremarkable. There is a small hiatal hernia.  Extensive atherosclerotic calcification is again noted involving the abdominal aorta, celiac axis, superior mesenteric artery and bilateral iliac arteries.  Overall calcified plaque burden has increased since 2008.  No aneurysmal disease is identified.  Bowel loops show moderate fecal material throughout the colon.  No evidence of acute bowel obstruction or perforation.  No hernias are identified.  Uterus and adnexal regions are unremarkable by CT.  No bony abnormalities are seen.  IMPRESSION: Significant hydronephrosis of the left kidney.  A ureteral calculus may have already passed into the bladder with a density noted in the right portion of the bladder.  The right kidney shows chronic atrophy.   Original Report Authenticated By: Irish Lack, M.D.    US Renal  03/09/2013  *RADIOLOGY REPORT*  Clinical Data: 72 year old female with pain.  RENAL/URINARY TRACT ULTRASOUND COMPLETE  Comparison:  10/13/2011 renal ultrasound.  Chest CTA 02/23/2012. CT abdomen and pelvis 10/22/2011.  Findings:  Right Kidney:  Chronically atrophied.  Left Kidney:  Mild to moderate hydronephrosis, new since 2012. Cortical echotexture at the upper limits of normal or mildly increased. Renal length 12.9 cm.  Bladder:   Unremarkable.  No ureteral jets demonstrated.  Calculated volume 88 ml.  IMPRESSION: 1.  New mild to moderate left hydronephrosis.  Obstructing etiology not identified. 2.  Chronic right renal atrophy.   Original Report Authenticated By: Erskine Speed, M.D.     EKG: Not done  Assessment/Plan Principal Problem:   Hydronephrosis of left kidney Active Problems:   Hypercholesterolemia   Hypothyroidism   HTN (hypertension)   Anemia   Complicated UTI (urinary tract infection)   ESRD (end stage renal disease) on dialysis   CAD (coronary artery disease)   Chronic systolic dysfunction of left ventricle   Diabetes mellitus  Hyponatremia   Nephrolithiasis   #1 hydronephrosis of the left kidney Questionable etiology. May have been secondary to a passed stone. Urinalysis consistent with a UTI. Urine cultures are pending. Continue empiric IV Rocephin. Urology has been consulted and per ED physician will see the patient.  #2 complicated urinary tract infection Patient's urinalysis is consistent with a UTI. Patient does have a left hydronephrosis with a passed stone possibly. Urine cultures are pending. Continue empiric IV Rocephin. Follow.  #3 end-stage renal disease/chronic kidney disease on hemodialysis Patient is on hemodialysis Monday Wednesdays and Fridays. Continue PhosLo. For consult with nephrology.  #4 hematuria Likely secondary to nephrolithiasis. CT of the abdomen and pelvis they show a left hydronephrosis with a density in the right portion of the bladder consistent with probable stone. Urinalysis consistent with a UTI. IV Rocephin.  #5 hypothyroidism Check a TSH. Continue home dose Synthroid.  #6 hypertension Resume home regimen of blood pressure medications.  #7 hyponatremia Likely secondary to problem #3. Patient for dialysis today. Prerenal.  #8 probable nephrolithiasis Noted on CT scan has a density in the bladder. Per urology.  #9 coronary artery disease Stable.  Continue home regimen of Imdur, Norvasc, clonidine, aspirin, Lopressor, statin.  #10 diabetes mellitus Check a hemoglobin A1c. Continue home dose of Lantus. Place on sliding scale insulin.  #11 prophylaxis PPI for GI prophylaxis. Lovenox for DVT prophylaxis.   Code Status: Full Family Communication: Updated patient. No family presents Disposition Plan: Admit to Med surg  Time spent: 31 MINS  [facility] Pager [phone]  If 7PM-7AM, please contact night-coverage [email or web address] Password [email or web address] 03/09/2013, 11:19 AM

## 2013-03-09 NOTE — ED Notes (Signed)
Patient is resting comfortably. 

## 2013-03-09 NOTE — ED Provider Notes (Signed)
Patient signed out to me pending results of a CT of her abdomen and pelvis. CT shows hydronephrosis on the left with an atrophic right kidney. Calcification in the bladder may indicate a past stone. Case is discussed with Dr. Janee Morn of [facility] and with Dr. Hillis Range otherwise urology and she will be admitted for IV antibiotics and urology evaluation.  Dione Booze, MD 03/09/13 1019

## 2013-03-09 NOTE — ED Provider Notes (Signed)
History     CSN: [id number]  Arrival date & time 03/09/13  0215   First MD Initiated Contact with Patient 03/09/13 0255      Chief Complaint  Patient presents with  . Abdominal Pain    The history is provided by the patient.   patient reports developing suprapubic abdominal pain as well as nausea and vomiting and dysuria with urinary urgency.  She denies radiation of her pain to her flank however EMS reported that she was complaining of flank pain.  Denies hematemesis.  No melena.  No diarrhea.  No upper abdominal pain.  No chest pain or shortness of breath.  She has no prior history of kidney stones.  Nothing worsens or improves her symptoms.  Symptoms are constant.   Past Medical History  Diagnosis Date  . Diabetes mellitus   . Hypercholesterolemia   . Hypothyroidism   . Colitis   . Coronary artery disease   . Hypertension   . GERD (gastroesophageal reflux disease)   . Myocardial infarction   . Peripheral vascular disease   . Pneumonia   . Angina   . H/O Clostridium difficile infection   . Hx of pulmonary embolus   . COPD (chronic obstructive pulmonary disease)   . Shortness of breath     uses O2 as needed  . Chronic kidney disease     M-W-F   [place]    Past Surgical History  Procedure Laterality Date  . Tubal ligation    . Amputaton of right fifth toe    . Tonsillectomy    . Insertion of dialysis catheter    . Av fistula placement  [id number]    right RUE AVF  . Bvt  May 18, 2012    First stage, Basilic Vein Transposition  . Bvt  07/27/12    Left arm Basilic Vein Transposition  . Arch aortogram  08/19/2012    Procedure: ARCH AORTOGRAM;  Surgeon: Fransisco Hertz, MD;  Location: [facility] OR;  Service: Vascular;  Laterality: N/A;  . Insertion of dialysis catheter  08/21/2012    Procedure: INSERTION OF DIALYSIS CATHETER;  Surgeon: Sherren Kerns, MD;  Location: [facility] OR;  Service: Vascular;  Laterality: Left;  internal jugular    Family History  Problem Relation Age of  Onset  . Diabetes type II    . Hypertension    . Diabetes Mother   . Kidney disease Mother   . Diabetes Father   . Anesthesia problems Neg Hx     History  Substance Use Topics  . Smoking status: Former Smoker -- 1.00 packs/day for 40 years    Types: Cigarettes    Quit date: 06/11/2011  . Smokeless tobacco: Never Used  . Alcohol Use: No    OB History   Grav Para Term Preterm Abortions TAB SAB Ect Mult Living                  Review of Systems  All other systems reviewed and are negative.    Allergies  Procaine hcl  Home Medications   Current Outpatient Rx  Name  Route  Sig  Dispense  Refill  . amLODipine (NORVASC) 10 MG tablet   Oral   Take 10 mg by mouth daily.          Marland Kitchen aspirin EC 81 MG tablet   Oral   Take 81 mg by mouth daily.         . calcium acetate (PHOSLO) 667 MG capsule  Oral   Take 667 mg by mouth 3 (three) times daily with meals.         . cloNIDine (CATAPRES) 0.2 MG tablet   Oral   Take 0.2 mg by mouth 2 (two) times daily.         . insulin glargine (LANTUS) 100 UNIT/ML injection   Subcutaneous   Inject 24 Units into the skin at bedtime.          . insulin NPH (HUMULIN N,NOVOLIN N) 100 UNIT/ML injection   Subcutaneous   Inject into the skin 4 (four) times daily - after meals and at bedtime. Sliding scale         . isosorbide mononitrate (IMDUR) 120 MG 24 hr tablet   Oral   Take 120 mg by mouth daily.          Marland Kitchen levothyroxine (SYNTHROID, LEVOTHROID) 25 MCG tablet   Oral   Take 25 mcg by mouth daily.          . metoprolol tartrate (LOPRESSOR) 25 MG tablet   Oral   Take 12.5 mg by mouth daily.         . nitroGLYCERIN (NITROSTAT) 0.4 MG SL tablet   Sublingual   Place 0.4 mg under the tongue every 5 (five) minutes as needed. For chest pain.         . pantoprazole (PROTONIX) 40 MG tablet   Oral   Take 40 mg by mouth daily.          . Probiotic Product (PROBIOTIC PO)   Oral   Take 1 capsule by mouth  daily.         . simvastatin (ZOCOR) 10 MG tablet   Oral   Take 10 mg by mouth every evening.           BP 146/60  Pulse 85  Temp(Src) 97.8 F (36.6 C) (Oral)  Resp 18  SpO2 95%  Physical Exam  Nursing note and vitals reviewed. Constitutional: She is oriented to person, place, and time. She appears well-developed and well-nourished. No distress.  HENT:  Head: Normocephalic and atraumatic.  Eyes: EOM are normal.  Neck: Normal range of motion.  Cardiovascular: Normal rate, regular rhythm and normal heart sounds.   Pulmonary/Chest: Effort normal and breath sounds normal.  Abdominal: Soft. She exhibits no distension. There is no tenderness.  Genitourinary:  No CVA tenderness  Musculoskeletal: Normal range of motion.  Neurological: She is alert and oriented to person, place, and time.  Skin: Skin is warm and dry.  Psychiatric: She has a normal mood and affect. Judgment normal.    ED Course  Procedures (including critical care time)  Labs Reviewed  URINALYSIS, ROUTINE W REFLEX MICROSCOPIC - Abnormal; Notable for the following:    Color, Urine RED (*)    APPearance CLOUDY (*)    Glucose, UA 250 (*)    Hgb urine dipstick LARGE (*)    Protein, ur 100 (*)    Leukocytes, UA LARGE (*)    All other components within normal limits  CBC - Abnormal; Notable for the following:    WBC 15.3 (*)    All other components within normal limits  BASIC METABOLIC PANEL - Abnormal; Notable for the following:    Sodium 130 (*)    Chloride 92 (*)    Glucose, Bld 306 (*)    BUN 43 (*)    Creatinine, Ser 4.74 (*)    GFR calc non Af Amer 9 (*)  GFR calc Af Amer 10 (*)    All other components within normal limits  URINE MICROSCOPIC-ADD ON - Abnormal; Notable for the following:    Squamous Epithelial / LPF FEW (*)    Bacteria, UA FEW (*)    All other components within normal limits  URINE CULTURE   No results found.   1. Urinary tract infection   2. Acute on chronic renal  insufficiency   3. Hydronephrosis, left   4. Hydronephrosis of left kidney   5. Complicated UTI (urinary tract infection)   6. ESRD (end stage renal disease) on dialysis   7. Nephrolithiasis   8. CAD (coronary artery disease)   9. Diabetes mellitus       MDM  Concerning for complicated UTI.  Ultrasound demonstrates left-sided hydronephrosis.  CT scan obtained.  CT scan pending.  Care transferred to Dr. Preston Fleeting before her CT results back.  He will followup on CT results and disposition planning.        Lyanne Co, MD 03/11/13 [phone]

## 2013-03-09 NOTE — ED Notes (Signed)
Lab in room to obtain blood

## 2013-03-09 NOTE — ED Notes (Signed)
Patient brought in by EMS, called for abdominal pain. States it began yesterday around 1600. Patient states pain is going around into left side. Patient also complaining of nausea and vomited once. Patient awake and oriented.

## 2013-03-09 NOTE — Consult Note (Signed)
[facility] Renal Consultation Note  Indication for Consultation:  Management of ESRD/hemodialysis; anemia, hypertension/volume and secondary hyperparathyroidism  HPI: [name] [name] is a 72 y.o. female on chronic MWF Dialysis ([place]) admitted by [facility]  with hydronephrosis Left Kidney , and UTI, and probable passing of Nephrolithiasis with Hematuria and Urology consulted.She had an uneventful Hemodialysis Monday  And yesterday develpoed suprapubic lower abdominal pain,left sided flank pain x1 day with no radiation.She also reports some hematuria.,denies any fever, no chills, no nausea, no vomiting, no chest pain, no shortness of breath, no cough, no hematemesis, no hematochezia, no melena, no weakness. Now in ER sleeping  Awoken and appears more comfortable.        Past Medical History  Diagnosis Date  . Diabetes mellitus   . Hypercholesterolemia   . Hypothyroidism   . Colitis   . Coronary artery disease   . Hypertension   . GERD (gastroesophageal reflux disease)   . Myocardial infarction   . Peripheral vascular disease   . Pneumonia   . Angina   . H/O Clostridium difficile infection   . Hx of pulmonary embolus   . COPD (chronic obstructive pulmonary disease)   . Shortness of breath     uses O2 as needed  . Chronic kidney disease     M-W-F   [place]    Past Surgical History  Procedure Laterality Date  . Tubal ligation    . Amputaton of right fifth toe    . Tonsillectomy    . Insertion of dialysis catheter    . Av fistula placement  [id number]    right RUE AVF  . Bvt  May 18, 2012    First stage, Basilic Vein Transposition  . Bvt  07/27/12    Left arm Basilic Vein Transposition  . Arch aortogram  08/19/2012    Procedure: ARCH AORTOGRAM;  Surgeon: Fransisco Hertz, MD;  Location: [facility] OR;  Service: Vascular;  Laterality: N/A;  . Insertion of dialysis catheter  08/21/2012    Procedure: INSERTION OF DIALYSIS CATHETER;  Surgeon: Sherren Kerns, MD;  Location: [facility] OR;   Service: Vascular;  Laterality: Left;  internal jugular      Family History  Problem Relation Age of Onset  . Diabetes type II    . Hypertension    . Diabetes Mother   . Kidney disease Mother   . Diabetes Father   . Anesthesia problems Neg Hx       reports that she quit smoking about 20 months ago. Her smoking use included Cigarettes. She has a 40 pack-year smoking history. She has never used smokeless tobacco. She reports that she does not drink alcohol or use illicit drugs.   Allergies  Allergen Reactions  . Procaine Hcl Nausea And Vomiting and Other (See Comments)    "Pass out"    Prior to Admission medications   Medication Sig Start Date End Date Taking? Authorizing Provider  amLODipine (NORVASC) 10 MG tablet Take 10 mg by mouth daily.  04/20/12  Yes Historical Provider, MD  aspirin EC 81 MG tablet Take 81 mg by mouth daily.   Yes Historical Provider, MD  calcium acetate (PHOSLO) 667 MG capsule Take 667 mg by mouth 3 (three) times daily with meals.   Yes Historical Provider, MD  cloNIDine (CATAPRES) 0.2 MG tablet Take 0.2 mg by mouth 2 (two) times daily.   Yes Historical Provider, MD  insulin glargine (LANTUS) 100 UNIT/ML injection Inject 24 Units into the skin at  bedtime.  11/01/11  Yes Clydia Llano, MD  insulin NPH (HUMULIN N,NOVOLIN N) 100 UNIT/ML injection Inject into the skin 4 (four) times daily - after meals and at bedtime. Sliding scale   Yes Historical Provider, MD  isosorbide mononitrate (IMDUR) 120 MG 24 hr tablet Take 120 mg by mouth daily.    Yes Historical Provider, MD  levothyroxine (SYNTHROID, LEVOTHROID) 25 MCG tablet Take 25 mcg by mouth daily.    Yes Historical Provider, MD  metoprolol tartrate (LOPRESSOR) 25 MG tablet Take 12.5 mg by mouth daily.   Yes Historical Provider, MD  nitroGLYCERIN (NITROSTAT) 0.4 MG SL tablet Place 0.4 mg under the tongue every 5 (five) minutes as needed. For chest pain.   Yes Historical Provider, MD  pantoprazole (PROTONIX) 40 MG  tablet Take 40 mg by mouth daily.    Yes Historical Provider, MD  Probiotic Product (PROBIOTIC PO) Take 1 capsule by mouth daily.   Yes Historical Provider, MD  simvastatin (ZOCOR) 10 MG tablet Take 10 mg by mouth every evening.   Yes Historical Provider, MD    ROS:  As above in Hpi  Physical Exam: Filed Vitals:   03/09/13 1233  BP: 183/83  Pulse: 90  Temp: 98.3 F (36.8 C)  Resp: 20     General: Alert , WF , chronically illl , nad, appropriate HEENT: [place], dry mm Eyes:  eomi Neck:  Supple, no jvd Heart: RRR Lungs: CTA bilaterally Abdomen:  Obese, bs +, soft, nontender, No CVA tenderness Extremities: No pedal edema Skin:  No overt rash or ulcer Neuro: OX3, no acute focal deficits Dialysis Access: Left ij perm cath  Dialysis Orders: Center: [facility] on MWF . EDW 62 HD [place] 2.0 k, Ca 2.5  Time 3.5 hrs Heparin 1,200. Access Left IJ perm cath BFR 350  DFR 600    Hectorol 0 mcg IV/HD Epogen 0   Units IV/HD  Venofer  50mg  q wkly hd  Other 0  Assessment/Plan 1. Left Hydronephrosis with uti= urology  consulting . IV Antibiotics( rocephin) 2. ESRD -  MWF HD ( [facility]) 3. Hypertension/volume  - UF with hd and meds 4. Anemia  - hgb =12.8  No epo, weekly iron on hd 5. Metabolic bone disease -   No vit d, ca and phos stable continue binders, phos lo. 6. DM= Per admit 7. CAD= stble, meds  8. Nutrition -renal , carb mod diet and renal vit.  Lenny Pastel, PA-C [facility] Beeper [phone] 03/09/2013, 2:01 PM   Patient seen and examined.  Agree with assessment and plan as above. [name]  MD [phone] pgr    [phone] cell 03/09/2013, 5:31 PM

## 2013-03-10 DIAGNOSIS — I2510 Atherosclerotic heart disease of native coronary artery without angina pectoris: Secondary | ICD-10-CM

## 2013-03-10 DIAGNOSIS — E119 Type 2 diabetes mellitus without complications: Secondary | ICD-10-CM

## 2013-03-10 LAB — BASIC METABOLIC PANEL
Calcium: 8.8 mg/dL (ref 8.4–10.5)
Creatinine, Ser: 3.83 mg/dL — ABNORMAL HIGH (ref 0.50–1.10)
GFR calc non Af Amer: 11 mL/min — ABNORMAL LOW (ref >90)
Sodium: 137 mEq/L (ref 135–145)

## 2013-03-10 LAB — CBC WITH DIFFERENTIAL/PLATELET
Basophils Absolute: 0.1 10*3/uL (ref 0.0–0.1)
Basophils Relative: 1 % (ref 0–1)
Eosinophils Absolute: 0.4 10*3/uL (ref 0.0–0.7)
Eosinophils Relative: 3 % (ref 0–5)
MCH: 29.3 pg (ref 26.0–34.0)
MCHC: 34.3 g/dL (ref 30.0–36.0)
MCV: 85.4 fL (ref 78.0–100.0)
Platelets: 231 10*3/uL (ref 150–400)
RDW: 14.2 % (ref 11.5–15.5)
WBC: 13.2 10*3/uL — ABNORMAL HIGH (ref 4.0–10.5)

## 2013-03-10 LAB — URINE CULTURE

## 2013-03-10 MED ORDER — CLONIDINE HCL 0.1 MG PO TABS
0.1000 mg | ORAL_TABLET | Freq: Two times a day (BID) | ORAL | Status: DC
  Administered 2013-03-10 (×2): 0.1 mg via ORAL
  Filled 2013-03-10 (×4): qty 1

## 2013-03-10 NOTE — Progress Notes (Signed)
Consult received.  Have evaluated pt medication and glucose control. No further recommendations at this time. Thank you, Lenor Coffin, RN, CNS, Diabetes Coordinator [phone])

## 2013-03-10 NOTE — Progress Notes (Addendum)
Subjective:  Feels better, no flank pain, tolerated hd yesterday Objective Vital signs in last 24 hours: Filed Vitals:   03/09/13 2020 03/09/13 2022 03/09/13 2052 03/10/13 0531  BP: 84/34 138/79 136/93 110/46  Pulse: 85 75 94 87  Temp:  98 F (36.7 C) 98.2 F (36.8 C) 97.4 F (36.3 C)  TempSrc:  Oral Oral Oral  Resp: 17 20 20 20   Weight:  61.8 kg (136 lb 3.9 oz) 63.5 kg (139 lb 15.9 oz)   SpO2:  99% 94% 98%   Weight change:   Intake/Output Summary (Last 24 hours) at 03/10/13 0839 Last data filed at 03/10/13 0540  Gross per 24 hour  Intake  727.5 ml  Output   2227 ml  Net -1499.5 ml   Labs: Basic Metabolic Panel:  Recent Labs Lab 03/09/13 0413 03/09/13 1700 03/10/13 0445  NA 130*  --  137  K 5.0  --  3.8  CL 92*  --  97  CO2 26  --  25  GLUCOSE 306*  --  191*  BUN 43*  --  24*  CREATININE 4.74* 3.92* 3.83*  CALCIUM 9.0  --  8.8   Liver Function Tests: No results found for this basename: AST, ALT, ALKPHOS, BILITOT, PROT, ALBUMIN,  in the last 168 hours No results found for this basename: LIPASE, AMYLASE,  in the last 168 hours No results found for this basename: AMMONIA,  in the last 168 hours CBC:  Recent Labs Lab 03/09/13 0413 03/09/13 1700 03/10/13 0445  WBC 15.3* 13.6* 13.2*  NEUTROABS  --   --  9.8*  HGB 12.8 12.2 12.0  HCT 37.8 35.1* 35.0*  MCV 85.3 84.6 85.4  PLT 244 231 231   Cardiac Enzymes: No results found for this basename: CKTOTAL, CKMB, CKMBINDEX, TROPONINI,  in the last 168 hours CBG:  Recent Labs Lab 03/09/13 1224 03/09/13 1625 03/09/13 1943 03/09/13 2059 03/10/13 0753  GLUCAP 195* 271* 122* 149* 182*    Iron Studies: No results found for this basename: IRON, TIBC, TRANSFERRIN, FERRITIN,  in the last 72 hours Studies/Results: Ct Abdomen Pelvis Wo Contrast  03/09/2013  *RADIOLOGY REPORT*  Clinical Data: Left-sided abdominal pain, nausea and vomiting.  CT ABDOMEN AND PELVIS WITHOUT CONTRAST  Technique:  Multidetector CT imaging  of the abdomen and pelvis was performed following the standard protocol without intravenous contrast.  Comparison: Prior CT performed at [facility] on 10/19/2007.  Findings: There is evidence of significant left-sided hydronephrosis and hydroureter with associated perinephric stranding and fluid.  As the ureter is followed into the pelvis, an obstructing calculus is not identified.  There is a density noted in the right portion of the base of the bladder which may represent a passed ureteral calculus.  The right kidney remains tiny and atrophic.  Unenhanced appearance of the liver, gallbladder, spleen and pancreas are unremarkable. There is a small hiatal hernia.  Extensive atherosclerotic calcification is again noted involving the abdominal aorta, celiac axis, superior mesenteric artery and bilateral iliac arteries.  Overall calcified plaque burden has increased since 2008.  No aneurysmal disease is identified.  Bowel loops show moderate fecal material throughout the colon.  No evidence of acute bowel obstruction or perforation.  No hernias are identified.  Uterus and adnexal regions are unremarkable by CT.  No bony abnormalities are seen.  IMPRESSION: Significant hydronephrosis of the left kidney.  A ureteral calculus may have already passed into the bladder with a density noted in the right portion of the  bladder.  The right kidney shows chronic atrophy.   Original Report Authenticated By: Irish Lack, M.D.    US Renal  03/09/2013  *RADIOLOGY REPORT*  Clinical Data: 72 year old female with pain.  RENAL/URINARY TRACT ULTRASOUND COMPLETE  Comparison:  10/13/2011 renal ultrasound.  Chest CTA 02/23/2012. CT abdomen and pelvis 10/22/2011.  Findings:  Right Kidney:  Chronically atrophied.  Left Kidney:  Mild to moderate hydronephrosis, new since 2012. Cortical echotexture at the upper limits of normal or mildly increased. Renal length 12.9 cm.  Bladder:  Unremarkable.  No ureteral jets demonstrated.   Calculated volume 88 ml.  IMPRESSION: 1.  New mild to moderate left hydronephrosis.  Obstructing etiology not identified. 2.  Chronic right renal atrophy.   Original Report Authenticated By: Erskine Speed, M.D.    Medications:   . sodium chloride   Intravenous STAT  . amLODipine  10 mg Oral Daily  . aspirin EC  81 mg Oral Daily  . calcium acetate  667 mg Oral TID WC  . cefTRIAXone (ROCEPHIN)  IV  1 g Intravenous Q24H  . cloNIDine  0.1 mg Oral BID  . enoxaparin (LOVENOX) injection  30 mg Subcutaneous Q24H  . [START ON 03/16/2013] ferric gluconate (FERRLECIT/NULECIT) IV  62.5 mg Intravenous Q Wed-HD  . insulin aspart  0-9 Units Subcutaneous TID WC  . insulin glargine  24 Units Subcutaneous QHS  . isosorbide mononitrate  120 mg Oral Daily  . levothyroxine  25 mcg Oral QAC breakfast  . metoprolol tartrate  12.5 mg Oral Daily  . pantoprazole  40 mg Oral Daily  . simvastatin  10 mg Oral QPM   General: Alert  nad, appropriate Heart: RRR  Lungs: CTA bilaterally  Abdomen: Obese, bs +, soft, nontender, No CVA tenderness  Extremities: No pedal edema Dialysis Access: Left ij perm cath   Dialysis Orders: Center: [facility] on MWF .  EDW 62 HD [place] 2.0 k, Ca 2.5 Time 3.5 hrs Heparin 1,200. Access Left IJ perm cath BFR 350 DFR 600  Hectorol 0 mcg IV/HD Epogen 0 Units IV/HD Venofer 50mg  q wkly hd  Other 0   Assessment/Plan  1. Left Hydronephrosis with uti= urine cul pending/ urology consulting . IV Antibiotics( rocephin) 2. ESRD - MWF HD ( [facility]) 3. Hypertension/volume - BP 110/46  this am/at edw post hd and stable on home bp meds 4. Anemia - hgb =12.0 No epo, weekly iron on hd 5. Metabolic bone disease - No vit d CA stable/continue binders, phos lo. 6. DM= Per admit/ hgbA1c=9 per pt , Ola MD adjusting her Insulin past month 7. CAD= stble, meds  8. Nutrition -renal , carb mod diet and renal vit   Lenny Pastel, PA-C [facility] Beeper [phone] 03/10/2013,8:39 AM  LOS: 1 day    Patient seen and examined.  Agree with assessment and plan as above. [name]  MD [phone] pgr    [phone] cell 03/10/2013, 2:28 PM

## 2013-03-10 NOTE — Progress Notes (Addendum)
[facility] PROGRESS NOTE  [name] [name]:[id number] DOB: February 20, 1941 DOA: 03/09/2013 PCP: Quentin Mulling, MD  Assessment/Plan: 1. L hydronephrosis: likely passed stone -symptoms resolved -Urology consulted yesterday, i d/w Dr.Dahlstedt and he didn't feel the need she needed further eval inpatient. -repeat USG in 1-2 weeks per dr.Dahlstedt in office -continue rocephin, change to PO tomorrow   2. UTI: complicated: -continue rocephin, U Cx with 100K colonies  3. ESRD: on HD MWF -renal Following  4. Hypothyroidism: -continue synthroid  5. HTN: -continue home meds  6. DM: stable -cotninue lantus, SSI  7. WJX:BJYNWG -cotninue ASA/lopressor, imdur/statin  DVt proph: lovenox  Code Status: FULL Family Communication: none at bedside Disposition Plan: home soon   Consultants:  Renal  URology  Antibiotics:  ROcephin 5/7  HPI/Subjective: Feels well, no complaints  Objective: Filed Vitals:   03/09/13 2020 03/09/13 2022 03/09/13 2052 03/10/13 0531  BP: 84/34 138/79 136/93 110/46  Pulse: 85 75 94 87  Temp:  98 F (36.7 C) 98.2 F (36.8 C) 97.4 F (36.3 C)  TempSrc:  Oral Oral Oral  Resp: 17 20 20 20   Weight:  61.8 kg (136 lb 3.9 oz) 63.5 kg (139 lb 15.9 oz)   SpO2:  99% 94% 98%    Intake/Output Summary (Last 24 hours) at 03/10/13 0807 Last data filed at 03/10/13 0540  Gross per 24 hour  Intake  727.5 ml  Output   2227 ml  Net -1499.5 ml   Filed Weights   03/09/13 1702 03/09/13 2022 03/09/13 2052  Weight: 64.1 kg (141 lb 5 oz) 61.8 kg (136 lb 3.9 oz) 63.5 kg (139 lb 15.9 oz)    Exam:   General:  AAOx3, no distress  Cardiovascular: S1S2/RRR  Respiratory: CTAB  Abdomen: soft, NT, BS present, no flank tenderness  Ext; no edema c/c  Data Reviewed: Basic Metabolic Panel:  Recent Labs Lab 03/09/13 0413 03/09/13 1700 03/10/13 0445  NA 130*  --  137  K 5.0  --  3.8  CL 92*  --  97  CO2 26  --  25  GLUCOSE 306*  --  191*  BUN 43*  --   24*  CREATININE 4.74* 3.92* 3.83*  CALCIUM 9.0  --  8.8  MG  --  2.1  --    Liver Function Tests: No results found for this basename: AST, ALT, ALKPHOS, BILITOT, PROT, ALBUMIN,  in the last 168 hours No results found for this basename: LIPASE, AMYLASE,  in the last 168 hours No results found for this basename: AMMONIA,  in the last 168 hours CBC:  Recent Labs Lab 03/09/13 0413 03/09/13 1700  WBC 15.3* 13.6*  HGB 12.8 12.2  HCT 37.8 35.1*  MCV 85.3 84.6  PLT 244 231   Cardiac Enzymes: No results found for this basename: CKTOTAL, CKMB, CKMBINDEX, TROPONINI,  in the last 168 hours BNP (last 3 results) No results found for this basename: PROBNP,  in the last 8760 hours CBG:  Recent Labs Lab 03/09/13 1224 03/09/13 1625 03/09/13 1943 03/09/13 2059 03/10/13 0753  GLUCAP 195* 271* 122* 149* 182*    No results found for this or any previous visit (from the past 240 hour(s)).   Studies: Ct Abdomen Pelvis Wo Contrast  03/09/2013  *RADIOLOGY REPORT*  Clinical Data: Left-sided abdominal pain, nausea and vomiting.  CT ABDOMEN AND PELVIS WITHOUT CONTRAST  Technique:  Multidetector CT imaging of the abdomen and pelvis was performed following the standard protocol without intravenous contrast.  Comparison: Prior  CT [facility] at [facility] on 10/19/2007.  Findings: There is evidence of significant left-sided hydronephrosis and hydroureter with associated perinephric stranding and fluid.  As the ureter is followed into the pelvis, an obstructing calculus is not identified.  There is a density noted in the right portion of the base of the bladder which may represent a passed ureteral calculus.  The right kidney remains tiny and atrophic.  Unenhanced appearance of the liver, gallbladder, spleen and pancreas are unremarkable. There is a small hiatal hernia.  Extensive atherosclerotic calcification is again noted involving the abdominal aorta, celiac axis, superior mesenteric artery and  bilateral iliac arteries.  Overall calcified plaque burden has increased since 2008.  No aneurysmal disease is identified.  Bowel loops show moderate fecal material throughout the colon.  No evidence of acute bowel obstruction or perforation.  No hernias are identified.  Uterus and adnexal regions are unremarkable by CT.  No bony abnormalities are seen.  IMPRESSION: Significant hydronephrosis of the left kidney.  A ureteral calculus may have already passed into the bladder with a density noted in the right portion of the bladder.  The right kidney shows chronic atrophy.   Original Report Authenticated By: Irish Lack, M.D.    US Renal  03/09/2013  *RADIOLOGY REPORT*  Clinical Data: 72 year old female with pain.  RENAL/URINARY TRACT ULTRASOUND COMPLETE  Comparison:  10/13/2011 renal ultrasound.  Chest CTA 02/23/2012. CT abdomen and pelvis 10/22/2011.  Findings:  Right Kidney:  Chronically atrophied.  Left Kidney:  Mild to moderate hydronephrosis, new since 2012. Cortical echotexture at the upper limits of normal or mildly increased. Renal length 12.9 cm.  Bladder:  Unremarkable.  No ureteral jets demonstrated.  Calculated volume 88 ml.  IMPRESSION: 1.  New mild to moderate left hydronephrosis.  Obstructing etiology not identified. 2.  Chronic right renal atrophy.   Original Report Authenticated By: Erskine Speed, M.D.     Scheduled Meds: . sodium chloride   Intravenous STAT  . amLODipine  10 mg Oral Daily  . aspirin EC  81 mg Oral Daily  . calcium acetate  667 mg Oral TID WC  . cefTRIAXone (ROCEPHIN)  IV  1 g Intravenous Q24H  . cloNIDine  0.2 mg Oral BID  . enoxaparin (LOVENOX) injection  30 mg Subcutaneous Q24H  . [START ON 03/16/2013] ferric gluconate (FERRLECIT/NULECIT) IV  62.5 mg Intravenous Q Wed-HD  . insulin aspart  0-9 Units Subcutaneous TID WC  . insulin glargine  24 Units Subcutaneous QHS  . isosorbide mononitrate  120 mg Oral Daily  . levothyroxine  25 mcg Oral QAC breakfast  .  metoprolol tartrate  12.5 mg Oral Daily  . pantoprazole  40 mg Oral Daily  . simvastatin  10 mg Oral QPM   Continuous Infusions:   Principal Problem:   Hydronephrosis of left kidney Active Problems:   Hypercholesterolemia   Hypothyroidism   HTN (hypertension)   Anemia   Complicated UTI (urinary tract infection)   ESRD (end stage renal disease) on dialysis   CAD (coronary artery disease)   Chronic systolic dysfunction of left ventricle   Diabetes mellitus   Hyponatremia   Nephrolithiasis    Time spent:    [facility]  [facility] Pager [phone]. If 7PM-7AM, please contact night-coverage at [email or web address], password [facility] 03/10/2013, 8:07 AM  LOS: 1 day

## 2013-03-10 NOTE — Progress Notes (Signed)
Utilization review 

## 2013-03-10 NOTE — Evaluation (Signed)
Occupational Therapy Evaluation and Discharge Summary Patient Details Name: [name] [name] MRN: [id number] DOB: Mar 10, 1941 Today's Date: 03/10/2013 Time: 4132-4401 OT Time Calculation (min): 29 min  OT Assessment / Plan / Recommendation Clinical Impression  Pt is a 72 yo female admitted for possible kidney stone with UTI who appears to be at or very close to baseline.  Reviewed energy conservation techniques as pt is on O2 at home.  No further OT needs at this time.    OT Assessment  Patient does not need any further OT services    Follow Up Recommendations  No OT follow up    Barriers to Discharge      Equipment Recommendations  None recommended by OT    Recommendations for Other Services    Frequency       Precautions / Restrictions Restrictions Weight Bearing Restrictions: No   Pertinent Vitals/Pain No pain noted.  O2 sat on 2 L [place] O2 98%.    ADL  Eating/Feeding: Performed;Independent Where Assessed - Eating/Feeding: Chair Grooming: Performed;Wash/dry hands;Wash/dry face;Teeth care;Supervision/safety Where Assessed - Grooming: Unsupported standing Upper Body Bathing: Simulated;Set up Where Assessed - Upper Body Bathing: Unsupported sitting Lower Body Bathing: Simulated;Set up Where Assessed - Lower Body Bathing: Unsupported sit to stand Upper Body Dressing: Simulated;Set up Where Assessed - Upper Body Dressing: Unsupported sitting Lower Body Dressing: Performed;Set up Where Assessed - Lower Body Dressing: Unsupported sit to stand Toilet Transfer: Performed;Supervision/safety Toilet Transfer Method: Other (comment) (ambulated into bathroom.) Toilet Transfer Equipment: Comfort height toilet;Grab bars Toileting - Clothing Manipulation and Hygiene: Performed;Independent Where Assessed - Toileting Clothing Manipulation and Hygiene: Standing Tub/Shower Transfer: Other (comment) (pt sponge bathes.) Transfers/Ambulation Related to ADLs: Pt walked in room with distant S. ADL  Comments: Pt able to perform all basic adls with S to I.    OT Diagnosis:    OT Problem List:   OT Treatment Interventions:     OT Goals    Visit Information  Last OT Received On: 03/10/13 Assistance Needed: +1    Subjective Data  Subjective: "I feel pretty good now." Patient Stated Goal: to go home.   Prior Functioning     Home Living Lives With: Family Available Help at Discharge: Family;Available PRN/intermittently;Other (Comment) (son works 10 hours a day and pt is alone) Type of Home: House Home Access: Ramped entrance Home Layout: One level Bathroom Shower/Tub: Other (comment) (sponge bath) Bathroom Toilet: Standard Home Adaptive Equipment: Bedside commode/3-in-1;Raised toilet seat with rails;Straight cane Prior Function Level of Independence: Independent Able to Take Stairs?: No Driving: No Vocation: Retired Comments: JRs transportation takes pt to dialysis MWF Communication Communication: No difficulties Dominant Hand: Right         Vision/Perception Vision - History Baseline Vision: No visual deficits Patient Visual Report: No change from baseline Vision - Assessment Vision Assessment: Vision not tested   [employer] Arousal/Alertness: Awake/alert Behavior During Therapy: WFL for tasks assessed/performed Overall Cognitive Status: Within Functional Limits for tasks assessed    Extremity/Trunk Assessment Right Upper Extremity Assessment RUE ROM/Strength/Tone: Within functional levels RUE Sensation: WFL - Light Touch RUE Coordination: WFL - gross/fine motor Left Upper Extremity Assessment LUE ROM/Strength/Tone: Within functional levels LUE Sensation: WFL - Light Touch LUE Coordination: WFL - gross/fine motor Trunk Assessment Trunk Assessment: Normal     Mobility Bed Mobility Bed Mobility: Supine to Sit Supine to Sit: 7: Independent Details for Bed Mobility Assistance: head flat, no rails used. Transfers Transfers: Sit to  Stand;Stand to Sit Sit to Stand: 5:  Supervision Stand to Sit: 5: Supervision Details for Transfer Assistance: S provided as this was first visit.     Exercise     Balance Balance Balance Assessed: Yes Dynamic Standing Balance Dynamic Standing - Balance Support: No upper extremity supported Dynamic Standing - Level of Assistance: 5: Stand by assistance   End of Session OT - End of Session Activity Tolerance: Patient tolerated treatment well Patient left: in chair;with call bell/phone within reach Nurse Communication: Mobility status  GO     Hope Budds 03/10/2013, 10:02 AM [phone]

## 2013-03-10 NOTE — Evaluation (Signed)
Physical Therapy Evaluation Patient Details Name: [name] [name] MRN: [id number] DOB: September 24, 1941 Today's Date: 03/10/2013 Time: 5284-1324 PT Time Calculation (min): 13 min  PT Assessment / Plan / Recommendation Clinical Impression  72 yo female who is independent at home after recent post acute rehab stay.  She states she is doing fine at home doing eveything she wants to and does exercises on her own. Pt does not need skilled PT follow up. Recommend she be up ad lib with nursing with O2 support    PT Assessment  Patent does not need any further PT services    Follow Up Recommendations  No PT follow up    Does the patient have the potential to tolerate intense rehabilitation      Barriers to Discharge        Equipment Recommendations  None recommended by PT    Recommendations for Other Services     Frequency      Precautions / Restrictions Restrictions Weight Bearing Restrictions: No   Pertinent Vitals/Pain No c/o pain      Mobility  Bed Mobility Bed Mobility: Supine to Sit Supine to Sit: 7: Independent Details for Bed Mobility Assistance: head flat, no rails used. Transfers Sit to Stand: 5: Supervision;7: Independent Stand to Sit: 7: Independent Details for Transfer Assistance: S provided as this was first visit. Ambulation/Gait Ambulation/Gait Assistance: 7: Independent Ambulation Distance (Feet): 25 Feet (O2 sat to 90 ) Assistive device: None Gait Pattern: Wide base of support (decreased heel stike on both feet) General Gait Details: pt did not want to walk in hallway.  Did not progress further at O2 sats decrease with activity Pt states she uses O2 all the time at home Stairs: No Wheelchair Mobility Wheelchair Mobility: No    Exercises Other Exercises Other Exercises: Reviewed repeated sit to stand, bilateral UE hip to hip in standing, heel raises.  Pt states she knows all these from rehab and does them all at home   PT Diagnosis:    PT Problem List:   PT  Treatment Interventions:     PT Goals    Visit Information  Last PT Received On: 03/10/13 Assistance Needed: +1    Subjective Data  Subjective: "I don't have any trouble walking" Patient Stated Goal: to go back home   Prior Functioning  Home Living Lives With: Family Available Help at Discharge: Family;Available PRN/intermittently;Other (Comment) (son works 10 hours a day and pt is alone) Type of Home: House Home Access: Ramped entrance Home Layout: One level Bathroom Shower/Tub: Other (comment) (sponge bath) Bathroom Toilet: Standard Home Adaptive Equipment: Bedside commode/3-in-1;Raised toilet seat with rails;Straight cane Prior Function Level of Independence: Independent Able to Take Stairs?: No Driving: No Vocation: Retired Comments: [facility] transportation takes pt to dialysis MWF Communication Communication: No difficulties Dominant Hand: Right    Cognition  Cognition Arousal/Alertness: Awake/alert Behavior During Therapy: WFL for tasks assessed/performed Overall Cognitive Status: Within Functional Limits for tasks assessed    Extremity/Trunk Assessment Right Upper Extremity Assessment RUE ROM/Strength/Tone: Within functional levels RUE Sensation: WFL - Light Touch RUE Coordination: WFL - gross/fine motor Left Upper Extremity Assessment LUE ROM/Strength/Tone: Within functional levels LUE Sensation: WFL - Light Touch LUE Coordination: WFL - gross/fine motor Right Lower Extremity Assessment RLE ROM/Strength/Tone: WFL for tasks assessed (unable to plantarflex in standing) RLE Sensation: WFL - Light Touch;WFL - Proprioception Left Lower Extremity Assessment LLE ROM/Strength/Tone: WFL for tasks assessed (unable to plantarflex in standing) LLE Sensation: WFL - Light Touch;WFL - Proprioception Trunk Assessment  Trunk Assessment: Normal   Balance Balance Balance Assessed: Yes Static Sitting Balance Static Sitting - Balance Support: No upper extremity supported;Feet  supported Static Sitting - Level of Assistance: 7: Independent Static Standing Balance Static Standing - Balance Support: No upper extremity supported;During functional activity Static Standing - Level of Assistance: 7: Independent Dynamic Standing Balance Dynamic Standing - Balance Support: No upper extremity supported Dynamic Standing - Level of Assistance: 5: Stand by assistance  End of Session PT - End of Session Activity Tolerance: Other (comment) (O2 desat with activity) Patient left: in bed  GP    Rosey Bath K. Manson Passey, [place] [phone] 03/10/2013, 10:41 AM

## 2013-03-11 LAB — RENAL FUNCTION PANEL
CO2: 27 mEq/L (ref 19–32)
Chloride: 93 mEq/L — ABNORMAL LOW (ref 96–112)
GFR calc Af Amer: 9 mL/min — ABNORMAL LOW (ref >90)
Glucose, Bld: 300 mg/dL — ABNORMAL HIGH (ref 70–99)
Potassium: 3.9 mEq/L (ref 3.5–5.1)
Sodium: 133 mEq/L — ABNORMAL LOW (ref 135–145)

## 2013-03-11 LAB — CBC
Hemoglobin: 11.9 g/dL — ABNORMAL LOW (ref 12.0–15.0)
RBC: 4.06 MIL/uL (ref 3.87–5.11)
WBC: 11.1 10*3/uL — ABNORMAL HIGH (ref 4.0–10.5)

## 2013-03-11 LAB — GLUCOSE, CAPILLARY
Glucose-Capillary: 188 mg/dL — ABNORMAL HIGH (ref 70–99)
Glucose-Capillary: 310 mg/dL — ABNORMAL HIGH (ref 70–99)
Glucose-Capillary: 154 mg/dL — ABNORMAL HIGH (ref 70–99)

## 2013-03-11 MED ORDER — CIPROFLOXACIN HCL 250 MG PO TABS
250.0000 mg | ORAL_TABLET | Freq: Every day | ORAL | Status: DC
  Administered 2013-03-11: 250 mg via ORAL
  Filled 2013-03-11 (×2): qty 1

## 2013-03-11 MED ORDER — CIPROFLOXACIN HCL 250 MG PO TABS: 250.0000 mg | ORAL_TABLET | Freq: Every day | ORAL | Status: DC

## 2013-03-11 NOTE — Discharge Summary (Signed)
Physician Discharge Summary  [name] [name] QMV:[id number] DOB: Dec 20, 1940 DOA: 03/09/2013  PCP: Quentin Mulling, MD  Admit date: 03/09/2013 Discharge date: 03/11/2013  Time spent: 40 minutes  Recommendations for Outpatient Follow-up:  1. PCP in 1 week 2. Dr.Dahlstedt in 1-2 weeks  Discharge Diagnoses:    Hydronephrosis of left kidney   Likely passed Stone   UTI   Hypercholesterolemia   Hypothyroidism   HTN (hypertension)   Anemia   ESRD (end stage renal disease) on dialysis   CAD (coronary artery disease)   Chronic systolic dysfunction of left ventricle   Diabetes mellitus   Hyponatremia   Nephrolithiasis   Discharge Condition: improved  Diet recommendation: renal  Filed Weights   03/10/13 2107 03/11/13 1200 03/11/13 1517  Weight: 63.501 kg (139 lb 15.9 oz) 63.8 kg (140 lb 10.5 oz) 62.4 kg (137 lb 9.1 oz)    History of present illness:  [name] [name] is a 72 y.o. female with past medical history of ESRD on hemodialysis MWF, diabetes mellitus, hyperlipidemia, hypothyroidism, hypertension, gastroesophageal reflux disease, coronary artery disease, COPD who presents to the ED with a one-day history of suprapubic lower abdominal pain. Left sided flank pain x1 day with no radiation. Patient also endorses some hematuria. Patient denies any fever, no chills, no nausea, no vomiting, no chest pain, no shortness of breath, no cough, no hematemesis, no hematochezia, no melena, no weakness. Patient subsequently presented to the ED where basic metabolic profile obtained it have a sodium of 130 chloride of 92 urine of 43 creatinine of 4.74 and a glucose of 306. CBC had a white count of 15.3 otherwise was within normal limits. Urinalysis which was done was cloudy with large leukocytes, negative nitrite, large hemoglobin, 11-20 WBCs, too numerous to count RBCs. Renal ultrasound which was done showed a new mild to moderate left hydronephrosis and chronic right renal atrophic. CT of the abdomen and pelvis  done shows significant hydronephrosis of the left kidney, a ureteral calculus may have already passed into the bladder with a density noted in the right portion of the bladder. Right kidney showed chronic atrophic. Patient was given a dose of IV Rocephin. Urology was consulted. ED physician stated he spoke with Dr. Hillis Range who agreed to consult on the patient.    Hospital Course:  1. L hydronephrosis: likely passed stone -symptoms resolved  -Urology consulted yesterday, i d/w Dr.Dahlstedt and he didn't feel the need she needed further eval inpatient.  -repeat USG in 1-2 weeks per dr.Dahlstedt in office  -she was initially treated with IV rocephin, change to PO ciprofloxacin to complete course -FU with URology in 2 weeks  2. UTI: complicated:  -initially treated with IV rocephin, then changed to PO ciprofloxacin -U Cx with 100K colonies polymicrobial  3. ESRD: on HD MWF  -dialyzed in hospital per Renal  4. Hypothyroidism:  -continue synthroid   5. HTN: stable -continue home meds   6. DM: stable  -cotninue lantus, SSI   7. MWU:XLKGMW  -cotninue ASA/lopressor, imdur/statin      Consultations:  Urology  Renal  Discharge Exam: Filed Vitals:   03/11/13 1400 03/11/13 1430 03/11/13 1517 03/11/13 1546  BP: 111/58 134/73 187/74 153/92  Pulse: 98 111  118  Temp:   97.9 F (36.6 C) 98.1 F (36.7 C)  TempSrc:   Oral   Resp:    18  Height:      Weight:   62.4 kg (137 lb 9.1 oz)   SpO2:   98% 92%  General: AAOx3 Cardiovascular: S1S2/RRR Respiratory: CTAB  Discharge Instructions  Discharge Orders   Future Orders Complete By Expires     Discharge instructions  As directed     Comments:      Renal Diabetic Diet    Increase activity slowly  As directed         Medication List    TAKE these medications       amLODipine 10 MG tablet  Commonly known as:  NORVASC  Take 10 mg by mouth daily.     aspirin EC 81 MG tablet  Take 81 mg by mouth daily.      calcium acetate 667 MG capsule  Commonly known as:  PHOSLO  Take 667 mg by mouth 3 (three) times daily with meals.     ciprofloxacin 250 MG tablet  Commonly known as:  CIPRO  Take 1 tablet (250 mg total) by mouth daily with breakfast. For 2 days     cloNIDine 0.2 MG tablet  Commonly known as:  CATAPRES  Take 0.2 mg by mouth 2 (two) times daily.     insulin glargine 100 UNIT/ML injection  Commonly known as:  LANTUS  Inject 24 Units into the skin at bedtime.     insulin NPH 100 UNIT/ML injection  Commonly known as:  HUMULIN N,NOVOLIN N  Inject into the skin 4 (four) times daily - after meals and at bedtime. Sliding scale     isosorbide mononitrate 120 MG 24 hr tablet  Commonly known as:  IMDUR  Take 120 mg by mouth daily.     levothyroxine 25 MCG tablet  Commonly known as:  SYNTHROID, LEVOTHROID  Take 25 mcg by mouth daily.     metoprolol tartrate 25 MG tablet  Commonly known as:  LOPRESSOR  Take 12.5 mg by mouth daily.     nitroGLYCERIN 0.4 MG SL tablet  Commonly known as:  NITROSTAT  Place 0.4 mg under the tongue every 5 (five) minutes as needed. For chest pain.     pantoprazole 40 MG tablet  Commonly known as:  PROTONIX  Take 40 mg by mouth daily.     PROBIOTIC PO  Take 1 capsule by mouth daily.     simvastatin 10 MG tablet  Commonly known as:  ZOCOR  Take 10 mg by mouth every evening.       Allergies  Allergen Reactions  . Procaine Hcl Nausea And Vomiting and Other (See Comments)    "Pass out"       Follow-up Information   Follow up with HOOPER,[name], MD. Schedule an appointment as soon as possible for a visit in 1 week.   Contact information:   [address] [place] [address] [phone]       Follow up with Chelsea Aus, MD. Schedule an appointment as soon as possible for a visit in 2 weeks.   Contact information:   [address] [place] [address] [phone]        The results of significant  diagnostics from this hospitalization (including imaging, microbiology, ancillary and laboratory) are listed below for reference.    Significant Diagnostic Studies: Ct Abdomen Pelvis Wo Contrast  03/09/2013  *RADIOLOGY REPORT*  Clinical Data: Left-sided abdominal pain, nausea and vomiting.  CT ABDOMEN AND PELVIS WITHOUT CONTRAST  Technique:  Multidetector CT imaging of the abdomen and pelvis was performed following the standard protocol without intravenous contrast.  Comparison: Prior CT performed at [facility] on 10/19/2007.  Findings:  There is evidence of significant left-sided hydronephrosis and hydroureter with associated perinephric stranding and fluid.  As the ureter is followed into the pelvis, an obstructing calculus is not identified.  There is a density noted in the right portion of the base of the bladder which may represent a passed ureteral calculus.  The right kidney remains tiny and atrophic.  Unenhanced appearance of the liver, gallbladder, spleen and pancreas are unremarkable. There is a small hiatal hernia.  Extensive atherosclerotic calcification is again noted involving the abdominal aorta, celiac axis, superior mesenteric artery and bilateral iliac arteries.  Overall calcified plaque burden has increased since 2008.  No aneurysmal disease is identified.  Bowel loops show moderate fecal material throughout the colon.  No evidence of acute bowel obstruction or perforation.  No hernias are identified.  Uterus and adnexal regions are unremarkable by CT.  No bony abnormalities are seen.  IMPRESSION: Significant hydronephrosis of the left kidney.  A ureteral calculus may have already passed into the bladder with a density noted in the right portion of the bladder.  The right kidney shows chronic atrophy.   Original Report Authenticated By: Irish Lack, M.D.    US Renal  03/09/2013  *RADIOLOGY REPORT*  Clinical Data: 72 year old female with pain.  RENAL/URINARY TRACT ULTRASOUND COMPLETE   Comparison:  10/13/2011 renal ultrasound.  Chest CTA 02/23/2012. CT abdomen and pelvis 10/22/2011.  Findings:  Right Kidney:  Chronically atrophied.  Left Kidney:  Mild to moderate hydronephrosis, new since 2012. Cortical echotexture at the upper limits of normal or mildly increased. Renal length 12.9 cm.  Bladder:  Unremarkable.  No ureteral jets demonstrated.  Calculated volume 88 ml.  IMPRESSION: 1.  New mild to moderate left hydronephrosis.  Obstructing etiology not identified. 2.  Chronic right renal atrophy.   Original Report Authenticated By: Erskine Speed, M.D.     Microbiology: Recent Results (from the past 240 hour(s))  URINE CULTURE     Status: None   Collection Time    03/09/13  3:20 AM      Result Value Range Status   Specimen Description URINE, CLEAN CATCH   Final   Special Requests NONE   Final   Culture  Setup Time 03/09/2013 10:46   Final   Colony Count >=100,000 COLONIES/ML   Final   Culture     Final   Value: Multiple bacterial morphotypes present, none predominant. Suggest appropriate recollection if clinically indicated.   Report Status 03/10/2013 FINAL   Final     Labs: Basic Metabolic Panel:  Recent Labs Lab 03/09/13 0413 03/09/13 1700 03/10/13 0445 03/11/13 1225  NA 130*  --  137 133*  K 5.0  --  3.8 3.9  CL 92*  --  97 93*  CO2 26  --  25 27  GLUCOSE 306*  --  191* 300*  BUN 43*  --  24* 54*  CREATININE 4.74* 3.92* 3.83* 5.13*  CALCIUM 9.0  --  8.8 9.0  MG  --  2.1  --   --   PHOS  --   --   --  5.1*   Liver Function Tests:  Recent Labs Lab 03/11/13 1225  ALBUMIN 3.5   No results found for this basename: LIPASE, AMYLASE,  in the last 168 hours No results found for this basename: AMMONIA,  in the last 168 hours CBC:  Recent Labs Lab 03/09/13 0413 03/09/13 1700 03/10/13 0445 03/11/13 1226  WBC 15.3* 13.6* 13.2* 11.1*  NEUTROABS  --   --  9.8*  --   HGB 12.8 12.2 12.0 11.9*  HCT 37.8 35.1* 35.0* 35.2*  MCV 85.3 84.6 85.4 86.7  PLT 244  231 231 233   Cardiac Enzymes: No results found for this basename: CKTOTAL, CKMB, CKMBINDEX, TROPONINI,  in the last 168 hours BNP: BNP (last 3 results) No results found for this basename: PROBNP,  in the last 8760 hours CBG:  Recent Labs Lab 03/10/13 1144 03/10/13 1649 03/10/13 2110 03/11/13 0732 03/11/13 1113  GLUCAP 339* 320* 306* 188* 310*       Signed:  Juliyah Mergen  [facility] 03/11/2013, 4:05 PM

## 2013-03-11 NOTE — Procedures (Signed)
I was present at this dialysis session. I have reviewed the session itself and made appropriate changes.   [name], MD [employer] 03/11/2013, 12:10 PM

## 2013-03-11 NOTE — Progress Notes (Signed)
Inpatient Diabetes Program Recommendations  AACE/ADA: New Consensus Statement on Inpatient Glycemic Control (2013)  Target Ranges:  Prepandial:   less than 140 mg/dL      Peak postprandial:   less than 180 mg/dL (1-2 hours)      Critically ill patients:  140 - 180 mg/dL   Inpatient Diabetes Program Recommendations Insulin - Meal Coverage: Pt eating 100%. Post-prandial cbg's are highin 300's.  Please add meal coverage of 3 units tidwc  Results for [name] [name], [name] [name] (MRN [id number]) as of 03/11/2013 18:24  Ref. Range 03/10/2013 07:53 03/10/2013 11:44 03/10/2013 16:49 03/10/2013 21:10 03/11/2013 07:32 03/11/2013 11:13  Glucose-Capillary Latest Range: 70-99 mg/dL 308 (H) 657 (H) 846 (H) 306 (H) 188 (H) 310 (H)

## 2013-03-11 NOTE — Progress Notes (Signed)
Subjective:   Sitting in chair, comfortable with no flank pain, no more hematuria.  Objective: Vital signs in last 24 hours: Temp:  [97.5 F (36.4 C)-98.1 F (36.7 C)] 97.7 F (36.5 C) (05/09 0926) Pulse Rate:  [78-91] 91 (05/09 0926) Resp:  [18-20] 18 (05/09 0926) BP: (113-153)/(49-65) 151/65 mmHg (05/09 0926) SpO2:  [91 %-98 %] 94 % (05/09 0926) Weight:  [63.501 kg (139 lb 15.9 oz)] 63.501 kg (139 lb 15.9 oz) (05/08 2107) Weight change: -0.599 kg (-1 lb 5.1 oz)  Intake/Output from previous day: 05/08 0701 - 05/09 0700 In: 1200 [P.O.:1200] Out: -  Intake/Output this shift: Total I/O In: 360 [P.O.:360] Out: -  EXAM: General appearance:  Alert, in no apparent distress Resp:  CTA without rales, rhonchi, or wheezes Cardio:  RRR without murmur or rub GI:  + BS, soft and nontender Extremities:  No edema Access:  Left IJ catheter  Lab Results:  Recent Labs  03/09/13 1700 03/10/13 0445  WBC 13.6* 13.2*  HGB 12.2 12.0  HCT 35.1* 35.0*  PLT 231 231   BMET:  Recent Labs  03/09/13 0413 03/09/13 1700 03/10/13 0445  NA 130*  --  137  K 5.0  --  3.8  CL 92*  --  97  CO2 26  --  25  GLUCOSE 306*  --  191*  BUN 43*  --  24*  CREATININE 4.74* 3.92* 3.83*  CALCIUM 9.0  --  8.8   No results found for this basename: PTH,  in the last 72 hours Iron Studies: No results found for this basename: IRON, TIBC, TRANSFERRIN, FERRITIN,  in the last 72 hours  Dialysis Orders: Center: [facility] on MWF .  EDW 62 HD Bath 2.0 k, Ca 2.5 Time 3.5 hrs Heparin 1,200. Access Left IJ perm cath BFR 350 DFR 600 Hectorol 0 mcg IV/HD Epogen 0 Units IV/HD Venofer 50mg  qwk hd   Assessment/Plan: 1. Left flank pain - left hydronephrosis, likely secondary to stone, which was passed; now asymptomatic, on PO Cipro. 2. ESRD - HD on MWF @ [facility]; K 3.8 yesterday.  HD pending today. 3. HTN/Volume - BP 151/65 on Amlodipine 10 mg qd, Clonidine 0.1 mg bid, Metoprolol 12.5 mg qd; wt 63.5 kg with EDW 62. 4. Anemia -  Hgb 12, no Epogen, Venofer qwk. 5. Secondary hyperparathyroidism - Ca 8.8 (9.7 corrected), P 4.1; Phoslo 1 with meals. 6. Nutrition - Alb 2.9, renal carb-mod diet, vitamin. 7. DM - per admit.    LOS: 2 days   LYLES,CHARLES 03/11/2013,10:30 AM  Patient seen and examined.  Agree with assessment and plan as above. OK for discharge after HD today, no change in HD orders. [name]  MD [phone] pgr    [phone] cell 03/11/2013, 12:10 PM

## 2013-03-14 NOTE — Progress Notes (Signed)
Pt discharged to home after visit summary reviewed and pt capable of re verbalizing medications and follow up appointments. Pt remains stable. No signs and symptoms of distress. Educated to return to ER in the event of SOB, dizziness, chest pain, or fainting. Delrose Rohwer, RN   

## 2013-04-12 ENCOUNTER — Encounter (HOSPITAL_BASED_OUTPATIENT_CLINIC_OR_DEPARTMENT_OTHER): Payer: Medicare Other | Attending: General Surgery

## 2013-04-12 DIAGNOSIS — E1169 Type 2 diabetes mellitus with other specified complication: Secondary | ICD-10-CM | POA: Insufficient documentation

## 2013-04-12 DIAGNOSIS — I739 Peripheral vascular disease, unspecified: Secondary | ICD-10-CM | POA: Insufficient documentation

## 2013-04-12 DIAGNOSIS — L97409 Non-pressure chronic ulcer of unspecified heel and midfoot with unspecified severity: Secondary | ICD-10-CM | POA: Insufficient documentation

## 2013-04-12 DIAGNOSIS — N186 End stage renal disease: Secondary | ICD-10-CM | POA: Insufficient documentation

## 2013-04-12 DIAGNOSIS — Z992 Dependence on renal dialysis: Secondary | ICD-10-CM | POA: Insufficient documentation

## 2013-04-12 LAB — GLUCOSE, CAPILLARY: Glucose-Capillary: 247 mg/dL — ABNORMAL HIGH (ref 70–99)

## 2013-04-13 NOTE — Progress Notes (Signed)
[facility]  NAME:  [name], [name]                   ACCOUNT NO.:  [id number]  MEDICAL RECORD NO.:  [id number]      DATE OF BIRTH:  15-Nov-1940  PHYSICIAN:  Ardath Sax, M.D.      VISIT DATE:  04/12/2013                                  OFFICE VISIT   HISTORY:  [name] [name] is a 72 year old female who has a history of type 2 diabetes and end-stage renal disease.  She is on dialysis.  She is here for [facility] 3 diabetic foot ulcer on her left heel, it is a cm and a half in diameter.  She has been taking care of by a podiatrist and they have been using Promogran and apparently, they sent her here for further treatment.  Today, I debrided it into the subcu and I could feel no pulses in either foot and so we are setting her up for a Vascular consult.  In the meantime, I started Santyl dressings that will be changed every day and we will have her come back here in a week.  I also think she is a candidate for hyperbaric oxygen since she has peripheral vascular disease, diabetes, and end-stage renal disease.  PHYSICAL EXAMINATION:  VITAL SIGNS:  Today, her blood pressure is 145/77, pulse 96.  Her glucose was 247.  She is afebrile.  She weighs 140 pounds. GENERAL:  Her outlook is not very good without going into a lot of different therapies to help her heal, this including hyperbaric oxygen.  We will probably put some biologic grafts on and see her once a week. So, we will see her next Tuesday and I wrote her prescription for Santyl to be used until she comes back.     Ardath Sax, M.D.     PP/MEDQ  D:  04/12/2013  T:  04/13/2013  Job:  [id number]

## 2013-04-19 ENCOUNTER — Other Ambulatory Visit (HOSPITAL_BASED_OUTPATIENT_CLINIC_OR_DEPARTMENT_OTHER): Payer: Self-pay | Admitting: General Surgery

## 2013-04-19 ENCOUNTER — Ambulatory Visit (HOSPITAL_COMMUNITY)
Admission: RE | Admit: 2013-04-19 | Discharge: 2013-04-19 | Disposition: A | Discharge status: Home / Self Care | Payer: Medicare Other | Source: Ambulatory Visit | Attending: General Surgery | Admitting: General Surgery

## 2013-04-19 DIAGNOSIS — Z01818 Encounter for other preprocedural examination: Secondary | ICD-10-CM | POA: Insufficient documentation

## 2013-04-19 DIAGNOSIS — M79672 Pain in left foot: Secondary | ICD-10-CM

## 2013-04-19 DIAGNOSIS — J4489 Other specified chronic obstructive pulmonary disease: Secondary | ICD-10-CM | POA: Insufficient documentation

## 2013-04-19 DIAGNOSIS — M1990 Unspecified osteoarthritis, unspecified site: Secondary | ICD-10-CM

## 2013-04-19 DIAGNOSIS — J449 Chronic obstructive pulmonary disease, unspecified: Secondary | ICD-10-CM | POA: Insufficient documentation

## 2013-05-03 ENCOUNTER — Encounter (HOSPITAL_BASED_OUTPATIENT_CLINIC_OR_DEPARTMENT_OTHER): Payer: Medicare Other | Attending: General Surgery

## 2013-05-03 ENCOUNTER — Other Ambulatory Visit (HOSPITAL_BASED_OUTPATIENT_CLINIC_OR_DEPARTMENT_OTHER): Payer: Self-pay | Admitting: General Surgery

## 2013-05-03 DIAGNOSIS — E1169 Type 2 diabetes mellitus with other specified complication: Secondary | ICD-10-CM | POA: Insufficient documentation

## 2013-05-03 DIAGNOSIS — N186 End stage renal disease: Secondary | ICD-10-CM | POA: Insufficient documentation

## 2013-05-03 DIAGNOSIS — L84 Corns and callosities: Secondary | ICD-10-CM | POA: Insufficient documentation

## 2013-05-03 DIAGNOSIS — R52 Pain, unspecified: Secondary | ICD-10-CM

## 2013-05-03 DIAGNOSIS — L97409 Non-pressure chronic ulcer of unspecified heel and midfoot with unspecified severity: Secondary | ICD-10-CM | POA: Insufficient documentation

## 2013-05-04 ENCOUNTER — Telehealth: Payer: Self-pay | Admitting: General Surgery

## 2013-05-04 ENCOUNTER — Other Ambulatory Visit: Payer: Self-pay | Admitting: General Surgery

## 2013-05-04 DIAGNOSIS — I509 Heart failure, unspecified: Secondary | ICD-10-CM

## 2013-05-04 DIAGNOSIS — I739 Peripheral vascular disease, unspecified: Secondary | ICD-10-CM

## 2013-05-04 NOTE — Telephone Encounter (Signed)
LEFT MESSAGE FOR PATIENT TO CALL BACK AND SCHEDULE APPT FOR TESTING ORDERED BY DR. Wiliam Ke

## 2013-05-12 ENCOUNTER — Ambulatory Visit (HOSPITAL_COMMUNITY)
Admission: RE | Admit: 2013-05-12 | Discharge: 2013-05-12 | Disposition: A | Discharge status: Home / Self Care | Payer: Medicare Other | Source: Ambulatory Visit | Attending: General Surgery | Admitting: General Surgery

## 2013-05-12 DIAGNOSIS — R52 Pain, unspecified: Secondary | ICD-10-CM

## 2013-05-12 DIAGNOSIS — S99929A Unspecified injury of unspecified foot, initial encounter: Secondary | ICD-10-CM | POA: Insufficient documentation

## 2013-05-12 DIAGNOSIS — X58XXXA Exposure to other specified factors, initial encounter: Secondary | ICD-10-CM | POA: Insufficient documentation

## 2013-05-12 DIAGNOSIS — S8990XA Unspecified injury of unspecified lower leg, initial encounter: Secondary | ICD-10-CM | POA: Insufficient documentation

## 2013-05-12 DIAGNOSIS — L97409 Non-pressure chronic ulcer of unspecified heel and midfoot with unspecified severity: Secondary | ICD-10-CM | POA: Insufficient documentation

## 2013-05-13 ENCOUNTER — Ambulatory Visit (HOSPITAL_COMMUNITY): Payer: Medicare Other

## 2013-05-26 ENCOUNTER — Encounter: Payer: Self-pay | Admitting: General Surgery

## 2013-05-26 ENCOUNTER — Ambulatory Visit (HOSPITAL_COMMUNITY)
Admission: RE | Admit: 2013-05-26 | Discharge: 2013-05-26 | Disposition: A | Discharge status: Home / Self Care | Payer: Medicare Other | Source: Ambulatory Visit | Attending: Cardiovascular Disease | Admitting: Cardiovascular Disease

## 2013-05-26 DIAGNOSIS — I2510 Atherosclerotic heart disease of native coronary artery without angina pectoris: Secondary | ICD-10-CM | POA: Insufficient documentation

## 2013-05-26 DIAGNOSIS — E785 Hyperlipidemia, unspecified: Secondary | ICD-10-CM | POA: Insufficient documentation

## 2013-05-26 DIAGNOSIS — I509 Heart failure, unspecified: Secondary | ICD-10-CM

## 2013-05-26 DIAGNOSIS — J449 Chronic obstructive pulmonary disease, unspecified: Secondary | ICD-10-CM | POA: Insufficient documentation

## 2013-05-26 DIAGNOSIS — I10 Essential (primary) hypertension: Secondary | ICD-10-CM | POA: Insufficient documentation

## 2013-05-26 DIAGNOSIS — J4489 Other specified chronic obstructive pulmonary disease: Secondary | ICD-10-CM | POA: Insufficient documentation

## 2013-05-26 NOTE — Progress Notes (Signed)
2D Echo Performed 05/26/2013    Celsey Asselin, RCS  

## 2013-06-07 ENCOUNTER — Encounter (HOSPITAL_BASED_OUTPATIENT_CLINIC_OR_DEPARTMENT_OTHER): Payer: Medicare Other | Attending: General Surgery

## 2013-06-07 DIAGNOSIS — L97409 Non-pressure chronic ulcer of unspecified heel and midfoot with unspecified severity: Secondary | ICD-10-CM | POA: Insufficient documentation

## 2013-06-07 DIAGNOSIS — I739 Peripheral vascular disease, unspecified: Secondary | ICD-10-CM | POA: Insufficient documentation

## 2013-06-07 DIAGNOSIS — E1169 Type 2 diabetes mellitus with other specified complication: Secondary | ICD-10-CM | POA: Insufficient documentation

## 2013-06-07 DIAGNOSIS — N186 End stage renal disease: Secondary | ICD-10-CM | POA: Insufficient documentation

## 2013-06-16 ENCOUNTER — Ambulatory Visit (HOSPITAL_COMMUNITY)
Admission: RE | Admit: 2013-06-16 | Discharge: 2013-06-16 | Disposition: A | Discharge status: Home / Self Care | Payer: Medicare Other | Source: Ambulatory Visit | Attending: Cardiovascular Disease | Admitting: Cardiovascular Disease

## 2013-06-16 DIAGNOSIS — I70219 Atherosclerosis of native arteries of extremities with intermittent claudication, unspecified extremity: Secondary | ICD-10-CM

## 2013-06-16 DIAGNOSIS — I739 Peripheral vascular disease, unspecified: Secondary | ICD-10-CM

## 2013-06-16 DIAGNOSIS — L97509 Non-pressure chronic ulcer of other part of unspecified foot with unspecified severity: Secondary | ICD-10-CM

## 2013-06-16 NOTE — Progress Notes (Signed)
Arterial Lower Ext. Duplex Completed. Preliminary by tech - Severe arterial occlusive disease bilaterally. Marilynne Halsted, BS, RDMS, RVT

## 2013-06-22 ENCOUNTER — Ambulatory Visit: Payer: Medicare Other | Admitting: Cardiovascular Disease

## 2013-07-05 ENCOUNTER — Encounter (HOSPITAL_BASED_OUTPATIENT_CLINIC_OR_DEPARTMENT_OTHER): Payer: Medicare Other | Attending: General Surgery

## 2013-07-05 DIAGNOSIS — L97409 Non-pressure chronic ulcer of unspecified heel and midfoot with unspecified severity: Secondary | ICD-10-CM | POA: Insufficient documentation

## 2013-07-05 DIAGNOSIS — I739 Peripheral vascular disease, unspecified: Secondary | ICD-10-CM | POA: Insufficient documentation

## 2013-07-05 DIAGNOSIS — N186 End stage renal disease: Secondary | ICD-10-CM | POA: Insufficient documentation

## 2013-07-05 DIAGNOSIS — E1169 Type 2 diabetes mellitus with other specified complication: Secondary | ICD-10-CM | POA: Insufficient documentation

## 2013-07-07 ENCOUNTER — Encounter: Payer: Self-pay | Admitting: Cardiovascular Disease

## 2013-07-07 ENCOUNTER — Ambulatory Visit (INDEPENDENT_AMBULATORY_CARE_PROVIDER_SITE_OTHER): Payer: Medicare Other | Admitting: Cardiovascular Disease

## 2013-07-07 VITALS — BP 172/82 | HR 88 | Ht 65.0 in | Wt 149.0 lb

## 2013-07-07 DIAGNOSIS — R5381 Other malaise: Secondary | ICD-10-CM

## 2013-07-07 DIAGNOSIS — Z87891 Personal history of nicotine dependence: Secondary | ICD-10-CM

## 2013-07-07 DIAGNOSIS — D689 Coagulation defect, unspecified: Secondary | ICD-10-CM

## 2013-07-07 DIAGNOSIS — Z01818 Encounter for other preprocedural examination: Secondary | ICD-10-CM

## 2013-07-07 DIAGNOSIS — Z79899 Other long term (current) drug therapy: Secondary | ICD-10-CM

## 2013-07-07 DIAGNOSIS — R0989 Other specified symptoms and signs involving the circulatory and respiratory systems: Secondary | ICD-10-CM

## 2013-07-07 DIAGNOSIS — I739 Peripheral vascular disease, unspecified: Secondary | ICD-10-CM

## 2013-07-07 NOTE — Patient Instructions (Addendum)
Dr. Allyson Sabal has ordered a peripheral angiogram to be done at [facility].  This procedure is going to look at the bloodflow in your lower extremities.  If Dr. Allyson Sabal is able to open up the arteries, you will have to spend one night in the hospital.  If he is not able to open the arteries, you will be able to go home that same day.    After the procedure, you will not be allowed to drive for 3 days or push, pull, or lift anything greater than 10 lbs for one week.    You will be required to have bloodwork and a chest xray prior to your procedure.  Our scheduler will advise you on when these items need to be done.       REPS: Eric Right groin  Dr Allyson Sabal has ordered a lexiscan myoview to be done prior to the angiogram and carotid dopplers to be done before your next appt.

## 2013-07-07 NOTE — Assessment & Plan Note (Signed)
The patient has a nonhealing ulcer on her left heel which has been progressive over the last year despite aggressive local wound care. She does complain of bilateral lower extremity claudication which is symmetric at less than 100 feet she had lower extremity arterial Dopplers performed in our office 06/16/13 revealing critical limb ischemia with high-grade iliac disease left greater than right, occluded SFAs bilaterally with tibial disease as well. Plan will be performed abdominal aortography by femoral runoff potential endovascular therapy of her high-grade left common iliac artery stenosis initially with assessment of her infra-inguinal circulation as well

## 2013-07-07 NOTE — Progress Notes (Signed)
07/07/2013 [name] [name]   1941-06-11  [id number]  Primary Physician Quentin Mulling, MD Primary Cardiologist: Runell Gess MD Roseanne Reno   HPI:  Ms. Barsamian is a 72 year old widowed Caucasian female mother of 2, her mother to 2 grandchildren referred by Dr. Lorina Rabon and Ardath Sax at the [facility] wound care center for evaluation and treatment of critical limb ischemia. Her podiatrist is Dr. Alvan Dame. Her cardiovascular factor profile is positive for 50 pack years of tobacco abuse having quit 2 years ago. She has treated hypertension, diabetes and hyperlipidemia. She has chronic renal deficiency status post been on hemodialysis for the last 2 years. Her nephrologist is Dr. Rodena Goldmann. She has had a right toe amputation back in September of 2008 because of having a nonhealing ulcer.she complains of bilateral lower shimmy claudication at 100 feet. She's had a nonhealing left heel ulcer for the last one year. Dopplers in our office revealed critical ischemia with high-grade iliac and occluded SFAs bilaterally.   Current Outpatient Prescriptions  Medication Sig Dispense Refill  . amLODipine (NORVASC) 10 MG tablet Take 10 mg by mouth daily.       Marland Kitchen aspirin EC 81 MG tablet Take 81 mg by mouth daily.      . calcium acetate (PHOSLO) 667 MG capsule Take 667 mg by mouth 3 (three) times daily with meals.      . cloNIDine (CATAPRES) 0.2 MG tablet Take 0.2 mg by mouth 2 (two) times daily.      . insulin glargine (LANTUS) 100 UNIT/ML injection Inject 24 Units into the skin at bedtime.       . insulin NPH (HUMULIN N,NOVOLIN N) 100 UNIT/ML injection Inject into the skin 4 (four) times daily - after meals and at bedtime. Sliding scale      . isosorbide mononitrate (IMDUR) 120 MG 24 hr tablet Take 120 mg by mouth daily.       Marland Kitchen levothyroxine (SYNTHROID, LEVOTHROID) 25 MCG tablet Take 25 mcg by mouth daily.       . metoprolol tartrate (LOPRESSOR) 25 MG tablet Take 12.5 mg by mouth daily.       . nitroGLYCERIN (NITROSTAT) 0.4 MG SL tablet Place 0.4 mg under the tongue every 5 (five) minutes as needed. For chest pain.      . pantoprazole (PROTONIX) 40 MG tablet Take 40 mg by mouth daily.       . Probiotic Product (PROBIOTIC PO) Take 1 capsule by mouth daily.      . simvastatin (ZOCOR) 10 MG tablet Take 10 mg by mouth every evening.      . [DISCONTINUED] enoxaparin (LOVENOX) 60 MG/0.6ML SOLN Inject 0.55 mLs (55 mg total) into the skin daily.       No current facility-administered medications for this visit.    Allergies  Allergen Reactions  . Procaine Hcl Nausea And Vomiting and Other (See Comments)    "Pass out"    History   Social History  . Marital Status: Widowed    Spouse Name: N/A    Number of Children: N/A  . Years of Education: N/A   Occupational History  . Not on file.   Social History Main Topics  . Smoking status: Former Smoker -- 1.00 packs/day for 40 years    Types: Cigarettes    Quit date: 06/11/2011  . Smokeless tobacco: Never Used  . Alcohol Use: No  . Drug Use: No  . Sexual Activity: Not Currently    Birth Control/  Protection: Post-menopausal   Other Topics Concern  . Not on file   Social History Narrative  . No narrative on file     Review of Systems: General: negative for chills, fever, night sweats or weight changes.  Cardiovascular: negative for chest pain, dyspnea on exertion, edema, orthopnea, palpitations, paroxysmal nocturnal dyspnea or shortness of breath Dermatological: negative for rash Respiratory: negative for cough or wheezing Urologic: negative for hematuria Abdominal: negative for nausea, vomiting, diarrhea, bright red blood per rectum, melena, or hematemesis Neurologic: negative for visual changes, syncope, or dizziness All other systems reviewed and are otherwise negative except as noted above.    Blood pressure 172/82, pulse 88, height 5\' 5"  (1.651 m), weight 149 lb (67.586 kg).  General appearance: alert and no  distress Neck: no adenopathy, no JVD, supple, symmetrical, trachea midline, thyroid not enlarged, symmetric, no tenderness/mass/nodules and a lateral carotid and subclavian bruits Lungs: clear to auscultation bilaterally Heart: soft outflow tract murmur Abdomen: soft, non-tender; bowel sounds normal; no masses,  no organomegaly Extremities: extremities normal, atraumatic, no cyanosis or edema and absent pedal pulses. Status post  right fifth toe amputation.Marland Kitchen Approximately 2 cm ischemic ulcer left heel.  EKG not performed today  ASSESSMENT AND PLAN:   Peripheral arterial disease The patient has a nonhealing ulcer on her left heel which has been progressive over the last year despite aggressive local wound care. She does complain of bilateral lower extremity claudication which is symmetric at less than 100 feet she had lower extremity arterial Dopplers performed in our office 06/16/13 revealing critical limb ischemia with high-grade iliac disease left greater than right, occluded SFAs bilaterally with tibial disease as well. Plan will be performed abdominal aortography by femoral runoff potential endovascular therapy of her high-grade left common iliac artery stenosis initially with assessment of her infra-inguinal circulation as well      Runell Gess MD [facility], [facility] 07/07/2013 9:57 AM

## 2013-07-14 ENCOUNTER — Ambulatory Visit (HOSPITAL_COMMUNITY)
Admission: RE | Admit: 2013-07-14 | Discharge: 2013-07-14 | Disposition: A | Discharge status: Home / Self Care | Payer: Medicare Other | Source: Ambulatory Visit | Attending: Cardiovascular Disease | Admitting: Cardiovascular Disease

## 2013-07-14 DIAGNOSIS — I10 Essential (primary) hypertension: Secondary | ICD-10-CM | POA: Insufficient documentation

## 2013-07-14 DIAGNOSIS — M79609 Pain in unspecified limb: Secondary | ICD-10-CM | POA: Insufficient documentation

## 2013-07-14 DIAGNOSIS — I739 Peripheral vascular disease, unspecified: Secondary | ICD-10-CM | POA: Insufficient documentation

## 2013-07-14 DIAGNOSIS — R5381 Other malaise: Secondary | ICD-10-CM | POA: Insufficient documentation

## 2013-07-14 DIAGNOSIS — J449 Chronic obstructive pulmonary disease, unspecified: Secondary | ICD-10-CM | POA: Insufficient documentation

## 2013-07-14 DIAGNOSIS — I2510 Atherosclerotic heart disease of native coronary artery without angina pectoris: Secondary | ICD-10-CM | POA: Insufficient documentation

## 2013-07-14 DIAGNOSIS — E119 Type 2 diabetes mellitus without complications: Secondary | ICD-10-CM | POA: Insufficient documentation

## 2013-07-14 DIAGNOSIS — Z86718 Personal history of other venous thrombosis and embolism: Secondary | ICD-10-CM | POA: Insufficient documentation

## 2013-07-14 DIAGNOSIS — I509 Heart failure, unspecified: Secondary | ICD-10-CM | POA: Insufficient documentation

## 2013-07-14 DIAGNOSIS — Z794 Long term (current) use of insulin: Secondary | ICD-10-CM | POA: Insufficient documentation

## 2013-07-14 DIAGNOSIS — Z86711 Personal history of pulmonary embolism: Secondary | ICD-10-CM | POA: Insufficient documentation

## 2013-07-14 DIAGNOSIS — Z01810 Encounter for preprocedural cardiovascular examination: Secondary | ICD-10-CM

## 2013-07-14 DIAGNOSIS — R42 Dizziness and giddiness: Secondary | ICD-10-CM | POA: Insufficient documentation

## 2013-07-14 DIAGNOSIS — Z87891 Personal history of nicotine dependence: Secondary | ICD-10-CM | POA: Insufficient documentation

## 2013-07-14 DIAGNOSIS — J4489 Other specified chronic obstructive pulmonary disease: Secondary | ICD-10-CM | POA: Insufficient documentation

## 2013-07-14 MED ORDER — TECHNETIUM TC 99M SESTAMIBI GENERIC - CARDIOLITE
10.0000 | Freq: Once | INTRAVENOUS | Status: AC | PRN
  Administered 2013-07-14: 10 via INTRAVENOUS

## 2013-07-14 MED ORDER — AMINOPHYLLINE 25 MG/ML IV SOLN
125.0000 mg | Freq: Once | INTRAVENOUS | Status: AC
  Administered 2013-07-14: 125 mg via INTRAVENOUS

## 2013-07-14 MED ORDER — REGADENOSON 0.4 MG/5ML IV SOLN
0.4000 mg | Freq: Once | INTRAVENOUS | Status: AC
  Administered 2013-07-14: 0.4 mg via INTRAVENOUS

## 2013-07-14 MED ORDER — TECHNETIUM TC 99M SESTAMIBI GENERIC - CARDIOLITE
30.0000 | Freq: Once | INTRAVENOUS | Status: AC | PRN
  Administered 2013-07-14: 30 via INTRAVENOUS

## 2013-07-14 NOTE — Procedures (Addendum)
[facility] [address] [place] [address] [phone]  Cardiology Nuclear Med Study  [name] [name] is a 72 y.o. female     MRN : [id number]     DOB: [birth year]  Procedure Date: 07/14/2013  Nuclear Med Background Indication for Stress Test:  Surgical Clearance History:  COPD and CHF;CAD Cardiac Risk Factors: History of Smoking, Hypertension, IDDM Type 2, Lipids, PVD and DVT;PE  Symptoms:  Fatigue, Light-Headedness and LEG PAIN   Nuclear Pre-Procedure Caffeine/Decaff Intake:  10:00pm NPO After: 8:00am   IV Site: R Forearm  IV 0.9% NS with Angio Cath:  22g  Chest Size (in):  N/A IV Started by: Emmit Pomfret, RN  Height: 5\' 4"  (1.626 m)  Cup Size: B  BMI:  Body mass index is 25.56 kg/(m^2). Weight:  149 lb (67.586 kg)   Tech Comments:  N/A    Nuclear Med Study 1 or 2 day study: 1 day  Stress Test Type:  Lexiscan  Order Authorizing Provider:  Obie Dredge   Resting Radionuclide: Technetium 69m Sestamibi  Resting Radionuclide Dose: 10.6 mCi   Stress Radionuclide:  Technetium 59m Sestamibi  Stress Radionuclide Dose: 31.8 mCi           Stress Protocol Rest HR: 88 Stress HR: 94  Rest BP: 155/84 Stress BP: 97/41  Exercise Time (min): n/a METS: n/a          Dose of Adenosine (mg):  n/a Dose of Lexiscan: 0.4 mg  Dose of Atropine (mg): n/a Dose of Dobutamine: n/a mcg/kg/min (at max HR)  Stress Test Technologist: Ernestene Mention, CCT Nuclear Technologist: Koren Shiver, CNMT   Rest Procedure:  Myocardial perfusion imaging was performed at rest 45 minutes following the intravenous administration of Technetium 41m Sestamibi. Stress Procedure:  The patient received IV Lexiscan 0.4 mg over 15-seconds.  Technetium 11m Sestamibi injected at 30-seconds.  Due to patient's extreme shortness of breath and drop in blood pressure. She was given IV Aminophylline 125 mg. Symptoms were resolved during recovery. There were no  significant changes with Lexiscan.  Quantitative spect images were obtained after a 45 minute delay.  Transient Ischemic Dilatation (Normal <1.22):  1.25  Lung/Heart Ratio (Normal <0.45):  0.30 QGS EDV:  74 ml QGS ESV:  38 ml LV Ejection Fraction: 48%  Signed by       Rest ECG: Non specific IVCD  Stress ECG: No significant change from baseline ECG  QPS Raw Data Images:  Normal; no motion artifact; normal heart/lung ratio. Stress Images:  Normal homogeneous uptake in all areas of the myocardium. Rest Images:  Normal homogeneous uptake in all areas of the myocardium. Subtraction (SDS):  No evidence of ischemia.  Impression Exercise Capacity:  Lexiscan with no exercise. BP Response:  Normal blood pressure response. Clinical Symptoms:  No significant symptoms noted. ECG Impression:  No ischemia Comparison with Prior Nuclear Study: No images to compare  Overall Impression:  Low risk stress nuclear study Mild GI gut uptake. and mild gut uptaks  LV Wall Motion:  Mild LV dysfunction   Runell Gess, MD  07/14/2013 5:42 PM

## 2013-07-22 ENCOUNTER — Encounter (HOSPITAL_COMMUNITY): Payer: Self-pay | Admitting: Pharmacist

## 2013-07-22 IMAGING — CR DG CHEST 2V
2 series · 2 of 2 positions shown · non-contrast
Comparison: 08/21/2012.

CLINICAL DATA: Osteoarthritis.  Preoperative evaluation.

CHEST - 2 VIEW

[w chest pa]
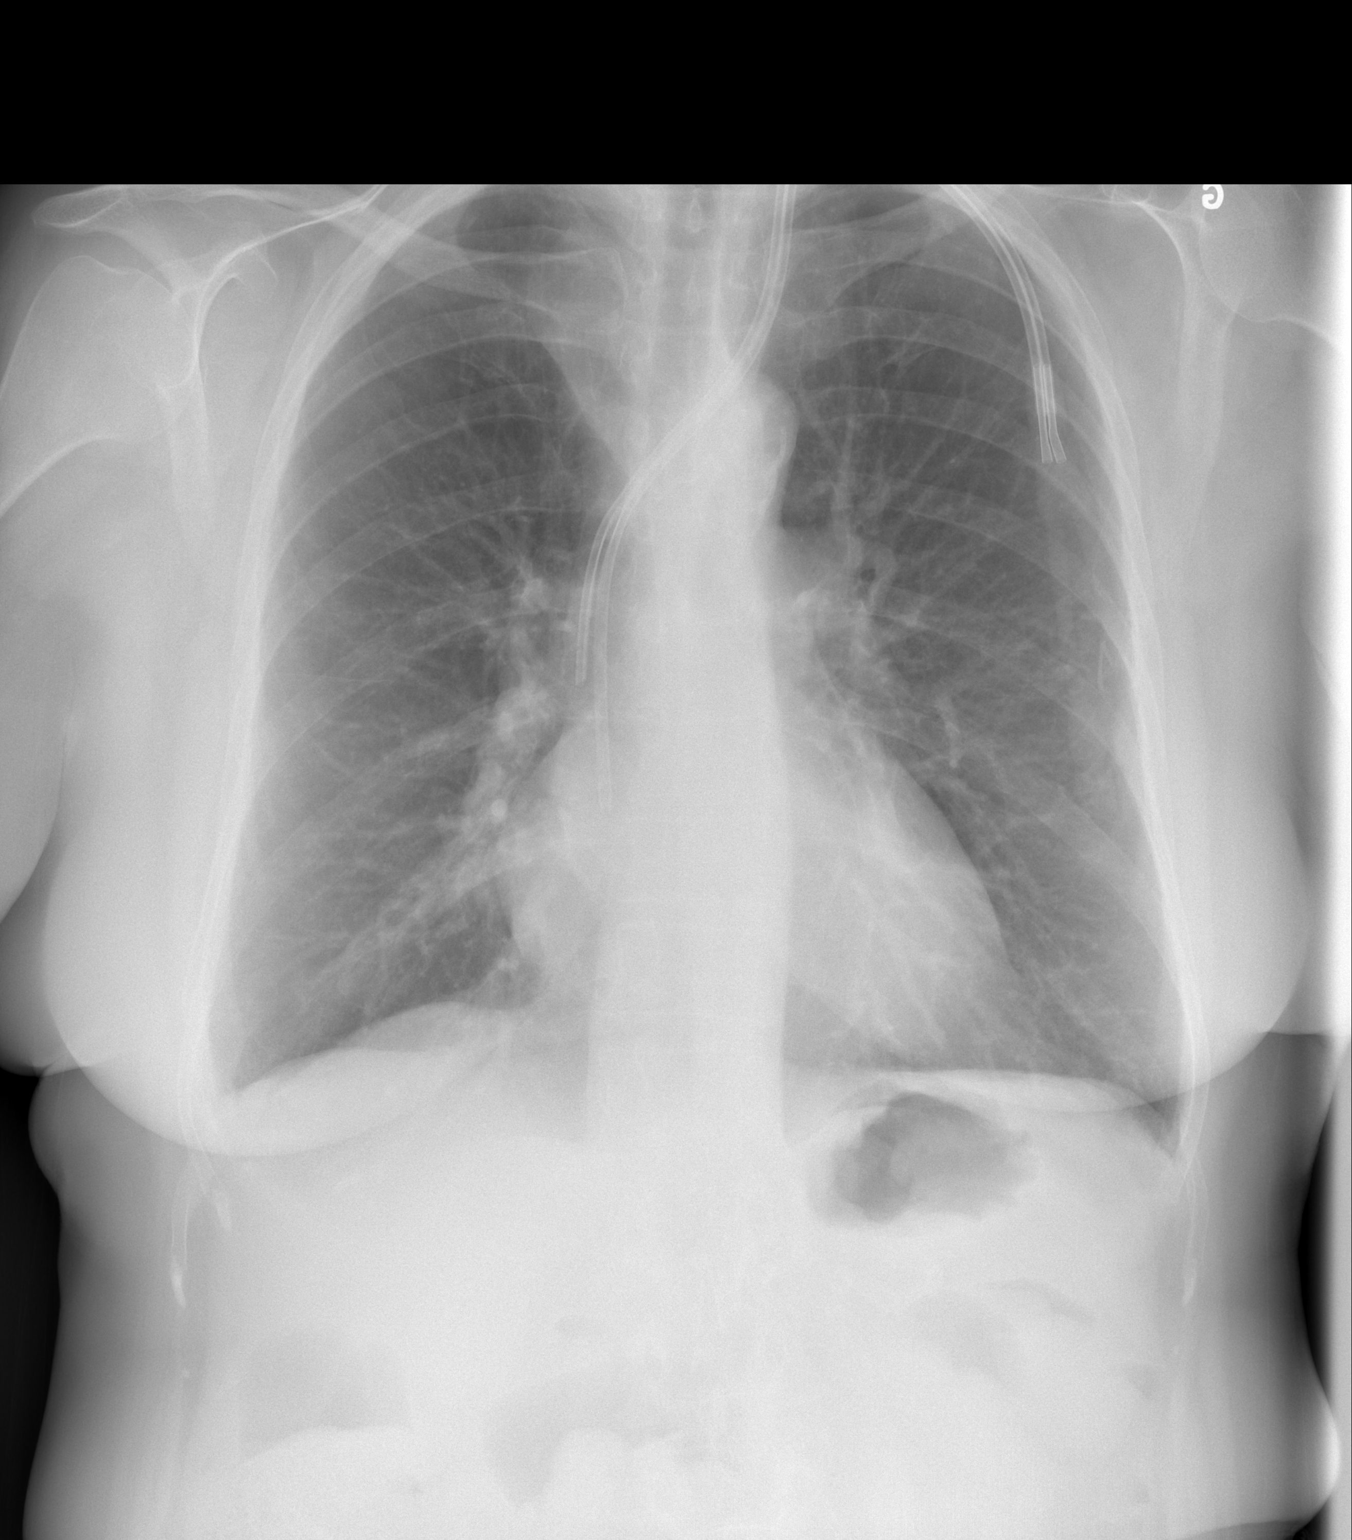

[w chest lat]
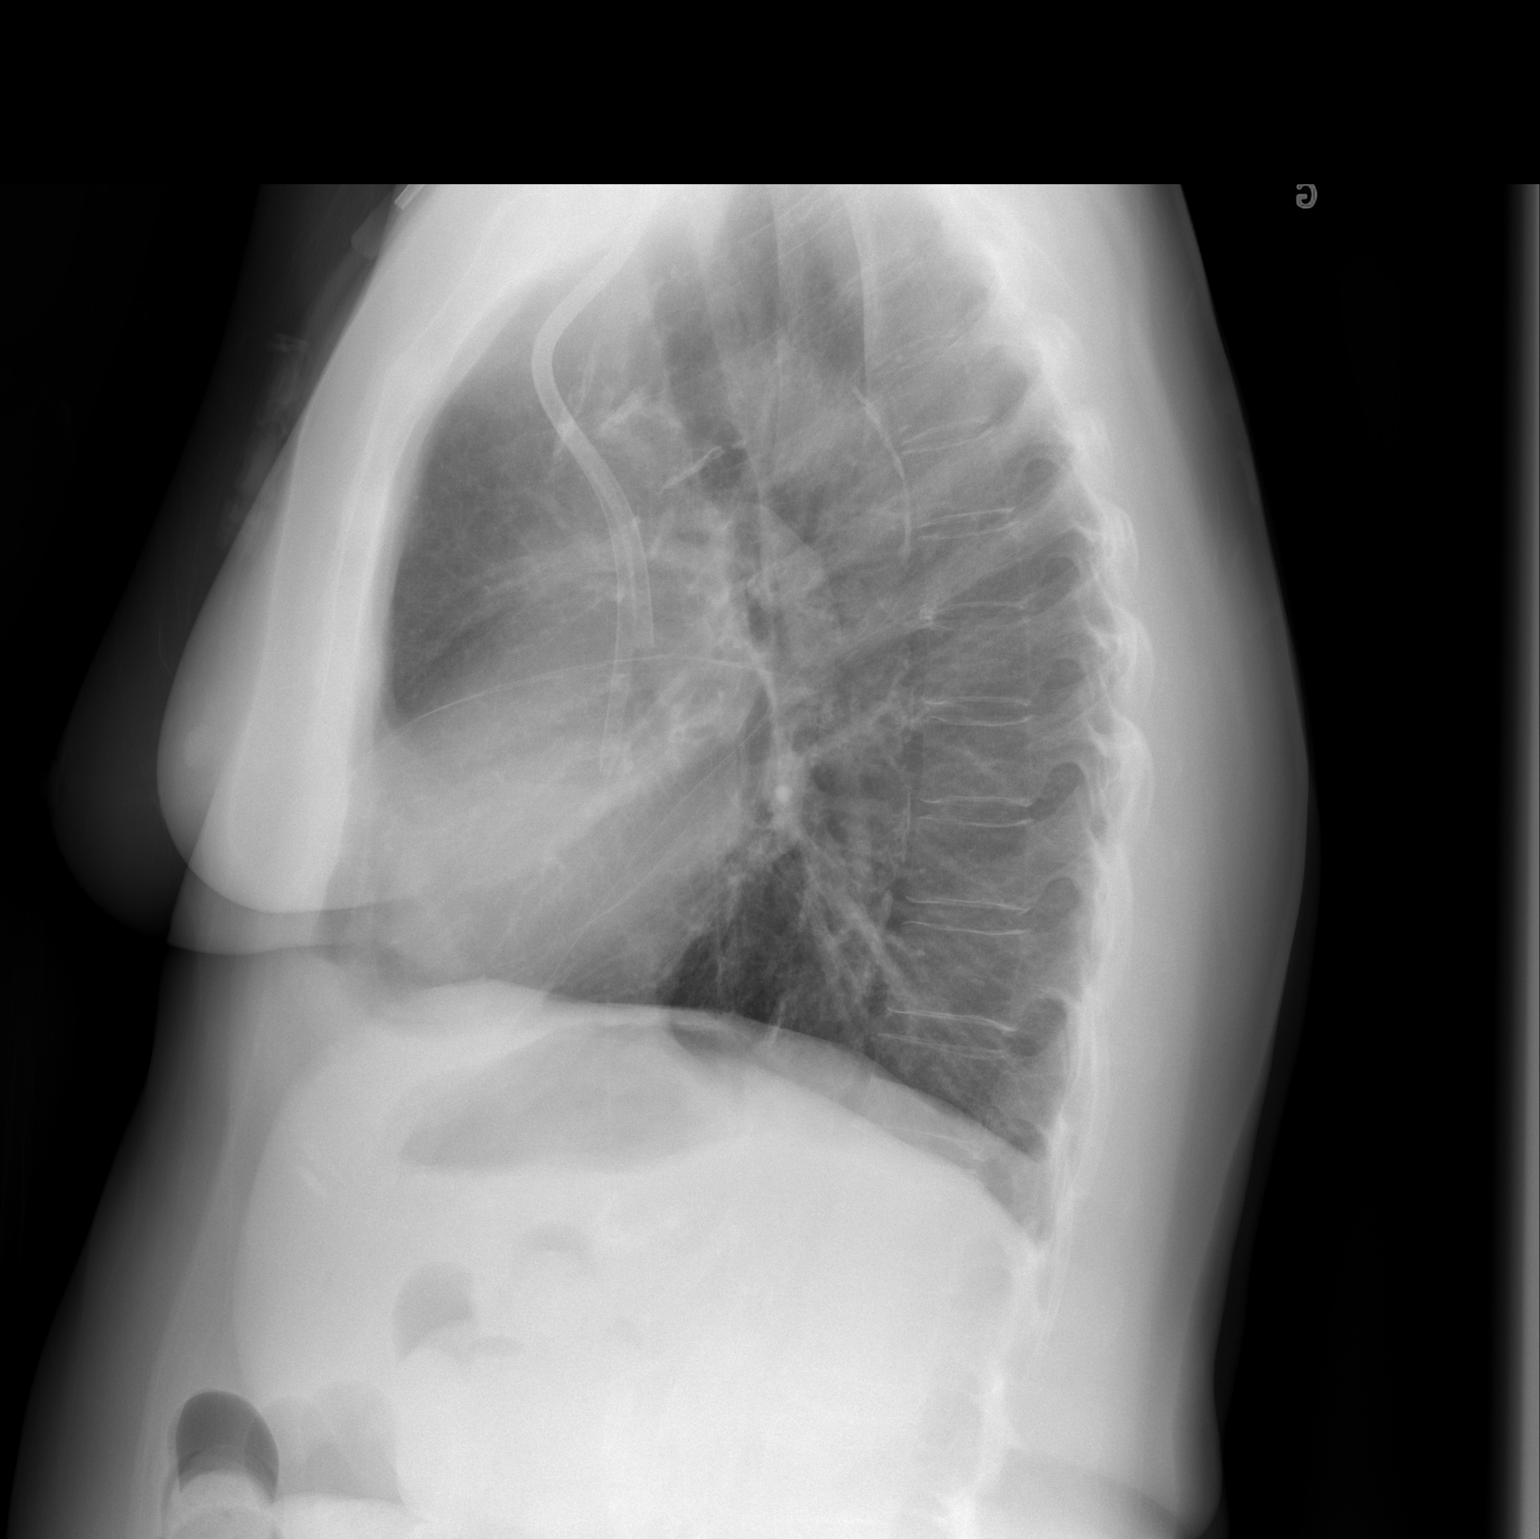

[2 of 2 positions shown; findings below may reference images not displayed]

FINDINGS: Normal sized heart.  Left jugular double-lumen catheter
tips in the superior vena cava and at the junction of the superior
vena cava and right atrium.  Clear lungs.  The lungs remain mildly
hyperexpanded.  Unremarkable bones.
IMPRESSION: Stable mild changes of COPD.  No acute abnormality.

## 2013-07-26 ENCOUNTER — Encounter: Payer: Self-pay | Admitting: *Deleted

## 2013-07-26 ENCOUNTER — Ambulatory Visit
Admission: RE | Admit: 2013-07-26 | Discharge: 2013-07-26 | Disposition: A | Discharge status: Home / Self Care | Payer: Medicare Other | Source: Ambulatory Visit | Attending: Cardiovascular Disease | Admitting: Cardiovascular Disease

## 2013-07-26 DIAGNOSIS — Z87891 Personal history of nicotine dependence: Secondary | ICD-10-CM

## 2013-07-26 LAB — CBC
HCT: 35 % — ABNORMAL LOW (ref 36.0–46.0)
Hemoglobin: 11.7 g/dL — ABNORMAL LOW (ref 12.0–15.0)
WBC: 11.7 10*3/uL — ABNORMAL HIGH (ref 4.0–10.5)

## 2013-07-26 LAB — PROTIME-INR: Prothrombin Time: 12 seconds (ref 11.6–15.2)

## 2013-07-26 LAB — APTT: aPTT: 34 seconds (ref 24–37)

## 2013-07-26 LAB — BASIC METABOLIC PANEL
Calcium: 10 mg/dL (ref 8.4–10.5)
Sodium: 137 mEq/L (ref 135–145)

## 2013-07-29 ENCOUNTER — Ambulatory Visit (HOSPITAL_COMMUNITY)
Admission: RE | Admit: 2013-07-29 | Discharge: 2013-07-29 | Disposition: A | Discharge status: Home / Self Care | Payer: Medicare Other | Source: Ambulatory Visit | Attending: Cardiovascular Disease | Admitting: Cardiovascular Disease

## 2013-07-29 DIAGNOSIS — R0989 Other specified symptoms and signs involving the circulatory and respiratory systems: Secondary | ICD-10-CM

## 2013-07-29 NOTE — Progress Notes (Signed)
Carotid Duplex Completed. [name] [name], BS, RDMS, RVT  

## 2013-08-01 ENCOUNTER — Encounter: Payer: Self-pay | Admitting: *Deleted

## 2013-08-02 ENCOUNTER — Ambulatory Visit (HOSPITAL_COMMUNITY)
Admission: RE | Admit: 2013-08-02 | Discharge: 2013-08-02 | Disposition: A | Discharge status: Home / Self Care | Payer: Medicare Other | Source: Ambulatory Visit | Attending: Cardiovascular Disease | Admitting: Cardiovascular Disease

## 2013-08-02 ENCOUNTER — Encounter (HOSPITAL_COMMUNITY)
Admission: RE | Disposition: A | Discharge status: Home / Self Care | Payer: Self-pay | Source: Ambulatory Visit | Attending: Cardiovascular Disease

## 2013-08-02 DIAGNOSIS — I714 Abdominal aortic aneurysm, without rupture, unspecified: Secondary | ICD-10-CM | POA: Insufficient documentation

## 2013-08-02 DIAGNOSIS — E119 Type 2 diabetes mellitus without complications: Secondary | ICD-10-CM | POA: Insufficient documentation

## 2013-08-02 DIAGNOSIS — E785 Hyperlipidemia, unspecified: Secondary | ICD-10-CM | POA: Insufficient documentation

## 2013-08-02 DIAGNOSIS — Z992 Dependence on renal dialysis: Secondary | ICD-10-CM | POA: Insufficient documentation

## 2013-08-02 DIAGNOSIS — I120 Hypertensive chronic kidney disease with stage 5 chronic kidney disease or end stage renal disease: Secondary | ICD-10-CM | POA: Insufficient documentation

## 2013-08-02 DIAGNOSIS — I739 Peripheral vascular disease, unspecified: Secondary | ICD-10-CM | POA: Insufficient documentation

## 2013-08-02 DIAGNOSIS — I708 Atherosclerosis of other arteries: Secondary | ICD-10-CM | POA: Insufficient documentation

## 2013-08-02 DIAGNOSIS — L97409 Non-pressure chronic ulcer of unspecified heel and midfoot with unspecified severity: Secondary | ICD-10-CM | POA: Insufficient documentation

## 2013-08-02 DIAGNOSIS — N186 End stage renal disease: Secondary | ICD-10-CM | POA: Insufficient documentation

## 2013-08-02 DIAGNOSIS — L98499 Non-pressure chronic ulcer of skin of other sites with unspecified severity: Secondary | ICD-10-CM | POA: Insufficient documentation

## 2013-08-02 DIAGNOSIS — Z01818 Encounter for other preprocedural examination: Secondary | ICD-10-CM

## 2013-08-02 DIAGNOSIS — I70269 Atherosclerosis of native arteries of extremities with gangrene, unspecified extremity: Secondary | ICD-10-CM

## 2013-08-02 HISTORY — PX: UPPER EXTREMITY ANGIOGRAM: SHX6310

## 2013-08-02 HISTORY — PX: LOWER EXTREMITY ANGIOGRAM: SHX5508

## 2013-08-02 LAB — POCT ACTIVATED CLOTTING TIME
Activated Clotting Time: 170 seconds
Activated Clotting Time: 186 seconds

## 2013-08-02 SURGERY — ANGIOGRAM, LOWER EXTREMITY
Anesthesia: LOCAL | Laterality: Bilateral

## 2013-08-02 MED ORDER — SODIUM CHLORIDE 0.9 % IV SOLN: INTRAVENOUS | Status: AC

## 2013-08-02 MED ORDER — HYDRALAZINE HCL 20 MG/ML IJ SOLN: 10.0000 mg | INTRAMUSCULAR | Status: DC

## 2013-08-02 MED ORDER — SODIUM CHLORIDE 0.9 % IV SOLN
INTRAVENOUS | Status: DC
  Administered 2013-08-02: 10 mL/h via INTRAVENOUS

## 2013-08-02 MED ORDER — SODIUM CHLORIDE 0.9 % IV SOLN: INTRAVENOUS | Status: DC

## 2013-08-02 MED ORDER — LIDOCAINE HCL (PF) 1 % IJ SOLN
INTRAMUSCULAR | Status: AC
  Filled 2013-08-02: qty 30

## 2013-08-02 MED ORDER — MORPHINE SULFATE 2 MG/ML IJ SOLN
INTRAMUSCULAR | Status: AC
  Filled 2013-08-02: qty 1

## 2013-08-02 MED ORDER — ASPIRIN EC 325 MG PO TBEC: 325.0000 mg | DELAYED_RELEASE_TABLET | Freq: Every day | ORAL | Status: DC

## 2013-08-02 MED ORDER — DIAZEPAM 5 MG PO TABS
5.0000 mg | ORAL_TABLET | ORAL | Status: AC
  Administered 2013-08-02: 5 mg via ORAL
  Filled 2013-08-02: qty 1

## 2013-08-02 MED ORDER — MORPHINE SULFATE 2 MG/ML IJ SOLN
1.0000 mg | INTRAMUSCULAR | Status: DC | PRN
  Administered 2013-08-02: 1 mg via INTRAVENOUS

## 2013-08-02 MED ORDER — ACETAMINOPHEN 325 MG PO TABS: 650.0000 mg | ORAL_TABLET | ORAL | Status: DC | PRN

## 2013-08-02 MED ORDER — HEPARIN SODIUM (PORCINE) 1000 UNIT/ML IJ SOLN
INTRAMUSCULAR | Status: AC
  Filled 2013-08-02: qty 1

## 2013-08-02 MED ORDER — ONDANSETRON HCL 4 MG/2ML IJ SOLN: 4.0000 mg | Freq: Four times a day (QID) | INTRAMUSCULAR | Status: DC | PRN

## 2013-08-02 MED ORDER — HYDRALAZINE HCL 20 MG/ML IJ SOLN
INTRAMUSCULAR | Status: AC
  Filled 2013-08-02: qty 1

## 2013-08-02 MED ORDER — ASPIRIN 81 MG PO CHEW
324.0000 mg | CHEWABLE_TABLET | ORAL | Status: AC
  Administered 2013-08-02: 324 mg via ORAL
  Filled 2013-08-02: qty 4

## 2013-08-02 MED ORDER — SODIUM CHLORIDE 0.9 % IJ SOLN: 3.0000 mL | INTRAMUSCULAR | Status: DC | PRN

## 2013-08-02 NOTE — CV Procedure (Signed)
[name] [name] is a 72 y.o. female    [id number] LOCATION:  FACILITY: [facility]  PHYSICIAN: [name] [name], M.D. Dec 04, 1940   DATE OF PROCEDURE:  08/02/2013  DATE OF DISCHARGE:   PV Angio    History obtained from chart review.[name] [name] is a 72 year old widowed Caucasian female mother of 2, her mother to 2 grandchildren referred by Dr. Lorina [name] and [name] [name] at the [facility] wound care center for evaluation and treatment of critical limb ischemia. Her podiatrist is Dr. Alvan [name]. Her cardiovascular factor profile is positive for 50 pack years of tobacco abuse having quit 2 years ago. She has treated hypertension, diabetes and hyperlipidemia. She has chronic renal deficiency status post been on hemodialysis for the last 2 years. Her nephrologist is Dr. Rodena [name]. She has had a right toe amputation back in September of 2008 because of having a nonhealing ulcer.she complains of bilateral lower shimmy claudication at 100 feet. She's had a nonhealing left heel ulcer for the last one year. Dopplers in our office revealed critical ischemia with high-grade iliac and occluded SFAs bilaterally.    PROCEDURE DESCRIPTION:    The patient was brought to the second floor [facility] cath lab in the postabsorptive state. She was premedicated with Valium 5 mg by mouth. Her right and left groins Were prepped and shaved in usual sterile fashion. Sensorcaine 1% was used for local anesthesia. A 5 French sheath was inserted into the right common femoral  artery using standard Seldinger technique. The patient received 3000 units  of heparin  intravenously.  A5 [place] pigtail as was a 4 [place] pigtail catheter were used for abdominal aortography with bifemoral runoff using bolus chase digital subtraction step staple technique. A 4 French right Judkins catheter was used for selective right subclavian the left subclavian angiography to determine future upper extremity access. Visipaque dye was used for the  entirety of the case. Retrograde  Aorta pressures monitored during the case.    HEMODYNAMICS:    AO SYSTOLIC/AO DIASTOLIC: 154/78   ANGIOGRAPHIC RESULTS:   1: Abdominal aortogram-there is a small distal abdominal aortic aneurysm. The aorta was closed up with calcified and diffusely diseased  2: Left lower extremity-95% calcified left common and external iliac artery stenosis . The left SFA was occluded at its origin with reconstitution in Hunter's canal. There is one-vessel runoff to the anterior tibial artery.   3: Right lower extremity-95-99% stenosis, calcified, in the right common and external iliac arteries. The right SFA was occluded with reconstitution of Hunter's canal. There is 1 vessel runoff via the peroneal  4: Right and left subclavian arteries-these were selectively visualized nor was patent. There is suitable for use during brachial access for upper extremity access to perform lower extremity intervention.   IMPRESSION:[name] [name] has high-grade calcified diffuse bilateral common and external iliac artery stenoses with occluded SFAs and infrapopliteal disease. I do not think that her risk arteries are approachable from the femoral approach because the sheath of the occlusive. It is still possible that her iliac arteries are percutaneous addressable via the brachial approach. The sheath was removed and pressure was held on the groin to achieve hemostasis. The patient left the lab in stable condition. She was gently hydrated and discharged him as an outpatient. I will see her back in the office in followup.  [name] Gess MD, [facility] 08/02/2013 2:14 PM

## 2013-08-05 ENCOUNTER — Encounter: Payer: Self-pay | Admitting: *Deleted

## 2013-08-05 ENCOUNTER — Telehealth: Payer: Self-pay | Admitting: *Deleted

## 2013-08-05 DIAGNOSIS — I6529 Occlusion and stenosis of unspecified carotid artery: Secondary | ICD-10-CM

## 2013-08-05 LAB — GLUCOSE, CAPILLARY: Glucose-Capillary: 111 mg/dL — ABNORMAL HIGH (ref 70–99)

## 2013-08-05 NOTE — Telephone Encounter (Signed)
Message copied by Marella Bile on Fri Aug 05, 2013  2:40 PM ------      Message from: Runell Gess      Created: Wed Aug 03, 2013  8:53 PM       Repeat 12 months. Mildly abn ------

## 2013-08-05 NOTE — Telephone Encounter (Signed)
Order placed for repeat carotid doppler in 1 year 

## 2013-08-09 ENCOUNTER — Encounter (HOSPITAL_BASED_OUTPATIENT_CLINIC_OR_DEPARTMENT_OTHER): Payer: Medicare Other | Attending: General Surgery

## 2013-08-09 DIAGNOSIS — L97409 Non-pressure chronic ulcer of unspecified heel and midfoot with unspecified severity: Secondary | ICD-10-CM | POA: Insufficient documentation

## 2013-08-09 DIAGNOSIS — I739 Peripheral vascular disease, unspecified: Secondary | ICD-10-CM | POA: Insufficient documentation

## 2013-08-09 DIAGNOSIS — E1169 Type 2 diabetes mellitus with other specified complication: Secondary | ICD-10-CM | POA: Insufficient documentation

## 2013-08-11 ENCOUNTER — Encounter: Payer: Self-pay | Admitting: General Surgery

## 2013-09-06 ENCOUNTER — Encounter (HOSPITAL_BASED_OUTPATIENT_CLINIC_OR_DEPARTMENT_OTHER): Payer: Medicare Other | Attending: General Surgery

## 2013-09-06 DIAGNOSIS — I739 Peripheral vascular disease, unspecified: Secondary | ICD-10-CM | POA: Insufficient documentation

## 2013-09-06 DIAGNOSIS — E1169 Type 2 diabetes mellitus with other specified complication: Secondary | ICD-10-CM | POA: Insufficient documentation

## 2013-09-06 DIAGNOSIS — N186 End stage renal disease: Secondary | ICD-10-CM | POA: Insufficient documentation

## 2013-09-06 DIAGNOSIS — L97409 Non-pressure chronic ulcer of unspecified heel and midfoot with unspecified severity: Secondary | ICD-10-CM | POA: Insufficient documentation

## 2013-10-19 ENCOUNTER — Emergency Department (HOSPITAL_COMMUNITY): Payer: Medicare Other

## 2013-10-19 ENCOUNTER — Encounter (HOSPITAL_COMMUNITY): Payer: Self-pay | Admitting: Emergency Medicine

## 2013-10-19 ENCOUNTER — Emergency Department (HOSPITAL_COMMUNITY)
Admission: EM | Admit: 2013-10-19 | Discharge: 2013-10-19 | Disposition: A | Discharge status: Home / Self Care | Payer: Medicare Other | Attending: Emergency Medicine | Admitting: Emergency Medicine

## 2013-10-19 DIAGNOSIS — Z79899 Other long term (current) drug therapy: Secondary | ICD-10-CM | POA: Insufficient documentation

## 2013-10-19 DIAGNOSIS — J441 Chronic obstructive pulmonary disease with (acute) exacerbation: Secondary | ICD-10-CM | POA: Insufficient documentation

## 2013-10-19 DIAGNOSIS — Z87891 Personal history of nicotine dependence: Secondary | ICD-10-CM | POA: Insufficient documentation

## 2013-10-19 DIAGNOSIS — I120 Hypertensive chronic kidney disease with stage 5 chronic kidney disease or end stage renal disease: Secondary | ICD-10-CM | POA: Insufficient documentation

## 2013-10-19 DIAGNOSIS — Z8619 Personal history of other infectious and parasitic diseases: Secondary | ICD-10-CM | POA: Insufficient documentation

## 2013-10-19 DIAGNOSIS — Z8701 Personal history of pneumonia (recurrent): Secondary | ICD-10-CM | POA: Insufficient documentation

## 2013-10-19 DIAGNOSIS — E039 Hypothyroidism, unspecified: Secondary | ICD-10-CM | POA: Insufficient documentation

## 2013-10-19 DIAGNOSIS — R5381 Other malaise: Secondary | ICD-10-CM | POA: Insufficient documentation

## 2013-10-19 DIAGNOSIS — Z992 Dependence on renal dialysis: Secondary | ICD-10-CM | POA: Insufficient documentation

## 2013-10-19 DIAGNOSIS — N186 End stage renal disease: Secondary | ICD-10-CM

## 2013-10-19 DIAGNOSIS — I209 Angina pectoris, unspecified: Secondary | ICD-10-CM | POA: Insufficient documentation

## 2013-10-19 DIAGNOSIS — Z7982 Long term (current) use of aspirin: Secondary | ICD-10-CM | POA: Insufficient documentation

## 2013-10-19 DIAGNOSIS — Z9851 Tubal ligation status: Secondary | ICD-10-CM | POA: Insufficient documentation

## 2013-10-19 DIAGNOSIS — Z794 Long term (current) use of insulin: Secondary | ICD-10-CM | POA: Insufficient documentation

## 2013-10-19 DIAGNOSIS — I252 Old myocardial infarction: Secondary | ICD-10-CM | POA: Insufficient documentation

## 2013-10-19 DIAGNOSIS — I2510 Atherosclerotic heart disease of native coronary artery without angina pectoris: Secondary | ICD-10-CM | POA: Insufficient documentation

## 2013-10-19 DIAGNOSIS — Z86711 Personal history of pulmonary embolism: Secondary | ICD-10-CM | POA: Insufficient documentation

## 2013-10-19 DIAGNOSIS — E119 Type 2 diabetes mellitus without complications: Secondary | ICD-10-CM | POA: Insufficient documentation

## 2013-10-19 DIAGNOSIS — D649 Anemia, unspecified: Secondary | ICD-10-CM | POA: Insufficient documentation

## 2013-10-19 DIAGNOSIS — R531 Weakness: Secondary | ICD-10-CM

## 2013-10-19 DIAGNOSIS — E7800 Pure hypercholesterolemia, unspecified: Secondary | ICD-10-CM | POA: Insufficient documentation

## 2013-10-19 DIAGNOSIS — K219 Gastro-esophageal reflux disease without esophagitis: Secondary | ICD-10-CM | POA: Insufficient documentation

## 2013-10-19 LAB — URINE MICROSCOPIC-ADD ON

## 2013-10-19 LAB — URINALYSIS, ROUTINE W REFLEX MICROSCOPIC
Ketones, ur: NEGATIVE mg/dL
Leukocytes, UA: NEGATIVE
Nitrite: NEGATIVE
Protein, ur: 30 mg/dL — AB
Urobilinogen, UA: 0.2 mg/dL (ref 0.0–1.0)

## 2013-10-19 LAB — CBC
MCH: 30.1 pg (ref 26.0–34.0)
MCV: 94.6 fL (ref 78.0–100.0)
Platelets: 250 10*3/uL (ref 150–400)
RBC: 2.96 MIL/uL — ABNORMAL LOW (ref 3.87–5.11)
RDW: 14.6 % (ref 11.5–15.5)
WBC: 11.2 10*3/uL — ABNORMAL HIGH (ref 4.0–10.5)

## 2013-10-19 LAB — POCT I-STAT, CHEM 8
BUN: 32 mg/dL — ABNORMAL HIGH (ref 6–23)
Calcium, Ion: 1.17 mmol/L (ref 1.13–1.30)
Creatinine, Ser: 3.4 mg/dL — ABNORMAL HIGH (ref 0.50–1.10)
HCT: 28 % — ABNORMAL LOW (ref 36.0–46.0)
Potassium: 4.1 mEq/L (ref 3.5–5.1)
Sodium: 138 mEq/L (ref 135–145)
TCO2: 26 mmol/L (ref 0–100)

## 2013-10-19 NOTE — ED Provider Notes (Signed)
CSN: [id number]     Arrival date & time 10/19/13  0225 History   First MD Initiated Contact with Patient 10/19/13 0232     Chief Complaint  Patient presents with  . Weakness   (Consider location/radiation/quality/duration/timing/severity/associated sxs/prior Treatment) HPI HX per PT  - Dialysis PT goes M-W-F, went to bed in her normal state of health, woke up a few hours later feeling "bad" some SOB. No CP or fevers, no chills, no trouble urinating - still makes some urine. HAs h/o COPD, no wheezes, some cough unchanged. Has required blood transfusion in the past, some dark stools over the last 2 days.    Past Medical History  Diagnosis Date  . Diabetes mellitus   . Hypercholesterolemia   . Hypothyroidism   . Colitis   . Coronary artery disease   . Hypertension   . GERD (gastroesophageal reflux disease)   . Myocardial infarction   . Peripheral vascular disease   . Pneumonia   . Angina   . H/O Clostridium difficile infection   . Hx of pulmonary embolus   . COPD (chronic obstructive pulmonary disease)   . Shortness of breath     uses O2 as needed  . Chronic kidney disease     M-W-F   [place]  . Critical lower limb ischemia   . Claudication    Past Surgical History  Procedure Laterality Date  . Tubal ligation    . Amputaton of right fifth toe    . Tonsillectomy    . Insertion of dialysis catheter    . Av fistula placement  [id number]    right RUE AVF  . Bvt  May 18, 2012    First stage, Basilic Vein Transposition  . Bvt  07/27/12    Left arm Basilic Vein Transposition  . Arch aortogram  08/19/2012    Procedure: ARCH AORTOGRAM;  Surgeon: Fransisco Hertz, MD;  Location: [facility] OR;  Service: Vascular;  Laterality: N/A;  . Insertion of dialysis catheter  08/21/2012    Procedure: INSERTION OF DIALYSIS CATHETER;  Surgeon: Sherren Kerns, MD;  Location: [facility] OR;  Service: Vascular;  Laterality: Left;  internal jugular   Family History  Problem Relation Age of Onset  . Diabetes type  II    . Hypertension    . Diabetes Mother   . Kidney disease Mother   . Diabetes Father   . Anesthesia problems Neg Hx    History  Substance Use Topics  . Smoking status: Former Smoker -- 1.00 packs/day for 40 years    Types: Cigarettes    Quit date: 06/11/2011  . Smokeless tobacco: Never Used  . Alcohol Use: No   OB History   Grav Para Term Preterm Abortions TAB SAB Ect Mult Living                 Review of Systems  Constitutional: Negative for fever and chills.  Eyes: Negative for visual disturbance.  Respiratory: Negative for shortness of breath.   Cardiovascular: Negative for chest pain.  Gastrointestinal: Negative for vomiting and abdominal pain.  Genitourinary: Negative for difficulty urinating.  Musculoskeletal: Negative for back pain, neck pain and neck stiffness.  Skin: Negative for rash.  Neurological: Positive for weakness. Negative for speech difficulty and headaches.  All other systems reviewed and are negative.    Allergies  Procaine hcl  Home Medications   Current Outpatient Rx  Name  Route  Sig  Dispense  Refill  . amLODipine (NORVASC) 10 MG  tablet   Oral   Take 10 mg by mouth daily.          Marland Kitchen aspirin EC 81 MG tablet   Oral   Take 81 mg by mouth daily.         . calcium acetate (PHOSLO) 667 MG capsule   Oral   Take 667 mg by mouth 3 (three) times daily with meals.         . insulin glargine (LANTUS) 100 UNIT/ML injection   Subcutaneous   Inject 25 Units into the skin at bedtime.          . insulin NPH (HUMULIN N,NOVOLIN N) 100 UNIT/ML injection   Subcutaneous   Inject 1-10 Units into the skin 3 (three) times daily after meals. Sliding scale         . isosorbide mononitrate (IMDUR) 120 MG 24 hr tablet   Oral   Take 120 mg by mouth daily.          Marland Kitchen levothyroxine (SYNTHROID, LEVOTHROID) 25 MCG tablet   Oral   Take 25 mcg by mouth daily.          . metoprolol tartrate (LOPRESSOR) 25 MG tablet   Oral   Take 12.5 mg by  mouth daily.         . pantoprazole (PROTONIX) 40 MG tablet   Oral   Take 40 mg by mouth daily.          . Probiotic Product (PROBIOTIC PO)   Oral   Take 1 capsule by mouth daily as needed (digestion).          . simvastatin (ZOCOR) 10 MG tablet   Oral   Take 10 mg by mouth every evening.         . nitroGLYCERIN (NITROSTAT) 0.4 MG SL tablet   Sublingual   Place 0.4 mg under the tongue every 5 (five) minutes as needed. For chest pain.          BP 177/66  Pulse 94  Temp(Src) 98.1 F (36.7 C)  Resp 18  SpO2 96% Physical Exam  Constitutional: She is oriented to person, place, and time. She appears well-developed and well-nourished.  HENT:  Head: Normocephalic and atraumatic.  Mouth/Throat: Oropharynx is clear and moist.  Eyes: Conjunctivae and EOM are normal. Pupils are equal, round, and reactive to light.  Neck: Neck supple.  Cardiovascular: Normal rate, regular rhythm and intact distal pulses.   Pulmonary/Chest: Effort normal. No respiratory distress.  Mildly dec breath sounds bilat, no resp distress  Abdominal: Soft. Bowel sounds are normal. She exhibits no distension. There is no tenderness.  Genitourinary:  Rectal: nontender, no stool in rectum  Musculoskeletal: Normal range of motion. She exhibits no edema.  Neurological: She is alert and oriented to person, place, and time.  Skin: Skin is warm and dry.    ED Course  Procedures (including critical care time) Labs Review Labs Reviewed  CBC - Abnormal; Notable for the following:    WBC 11.2 (*)    RBC 2.96 (*)    Hemoglobin 8.9 (*)    HCT 28.0 (*)    All other components within normal limits  POCT I-STAT, CHEM 8 - Abnormal; Notable for the following:    BUN 32 (*)    Creatinine, Ser 3.40 (*)    Glucose, Bld 132 (*)    Hemoglobin 9.5 (*)    HCT 28.0 (*)    All other components within normal limits  URINALYSIS, ROUTINE W  REFLEX MICROSCOPIC   Imaging Review Dg Chest Portable 1 View  10/19/2013    CLINICAL DATA:  Weakness with shortness of breath.  EXAM: PORTABLE CHEST - 1 VIEW  COMPARISON:  07/26/2013  FINDINGS: Hyperinflated lungs with apical emphysematous change. There is interlobular septal thickening at the bases that is new. No effusion or pneumothorax. Stable heart size and mediastinal contours. Unchanged positioning of dialysis catheter via left IJ approach.  IMPRESSION: Mild pulmonary edema that is superimposed on COPD.   Electronically Signed   By: Tiburcio Pea M.D.   On: 10/19/2013 03:10    EKG Interpretation    Date/Time:  Wednesday October 19 2013 02:34:31 EST Ventricular Rate:  93 PR Interval:  145 QRS Duration: 107 QT Interval:  379 QTC Calculation: 471 R Axis:   75 Text Interpretation:  Sinus rhythm Non-specific ST-t changes Incomplete left bundle branch block Baseline wander in lead(s) V5 No significant change since last tracing Confirmed by Saharra Santo  MD, Mase Dhondt [phone]) on 10/19/2013 2:49:02 AM           Labs reviewed has had almost 3 point drop in hemoglobin in the last 3 months when reviewing previous labs.  Patient reporting dark stools, no history of GI bleeding.  Concern for dialysis with heparin in a.m.  4:52 AM d/w hospitalist on-call Dr Betti Cruz.  He recommends okay for dialysis as an outpatient today as scheduled. Follow up outpatient for anemia and with primary care physician for outpatient colonoscopy.   Patient agreeable to plan - will follow up this morning as scheduled dialysis Center, and contact her physician today for colonoscopy and repeat blood work. She verbalizes understanding strict return precautions.  MDM  Diagnosis: Generalized weakness, anemia, end-stage renal disease  Evaluated with chest x-ray, EKG and imaging reviewed as above Medicine consult obtained Vital signs and nurses notes reviewed and considered   Sunnie Nielsen, MD 10/19/13 [phone]

## 2013-10-19 NOTE — ED Notes (Signed)
Pt. Ambulated to the bathroom without any difficulty

## 2013-10-19 NOTE — ED Notes (Signed)
Pt. Eating breakfast

## 2013-10-19 NOTE — ED Notes (Signed)
[facility] arrived to transport patient to dialysis appointment as agreed per MD [name] Highman. Upon [facility] entering room patient stated that she needs to go home prior to dialysis and requests that [facility] take her home en route to dialysis center. Transporting patient home via ambulance is not medically necessary at this time, she is ambulatory. Patient declined to pay cost of ambulance transport home, and will not agree to go to dialysis center without going home first. Patient's son is slated to retrieve patient from ED this AM, but multiple phone calls have not yielded an answer. Currently patient will remain in department awaiting ride home, and will arrange for transportation to dialysis center afterward. Charge RN informed of situation and advised this RN to place patient as boarder status pending arrangement of ride via family.

## 2013-10-19 NOTE — ED Notes (Signed)
Per EMS, pt had virus a couple weeks ago and has been having episodes of weakness and "low BP"  with activity since. Pt ambulated to stretcher. Pt is dialysis, pt supposed to to be dialyzed tomorrow.

## 2013-10-19 NOTE — ED Notes (Addendum)
Pt. Is alert and oriented X4.  She is aware of wanting to go home first prior to going to Dialysis.  She was watching TV.  Charge RN is working on getting pt. A ride to her home. Pt is aware of plan of care.

## 2013-10-19 NOTE — ED Notes (Signed)
Dr. [name] Highman requested [facility] to get patient ride to her dialysis center. Secretary notified and reports she will contact [facility] for patient transportation.

## 2013-10-19 NOTE — ED Notes (Signed)
SPOKE TO PT SON DWAYNE AND HE IS COMING NOW TO PICK UP THE PATIENT. STATES HE WAS ASLEEP AND DID NOT HEAR THE PHONE

## 2013-10-19 NOTE — ED Notes (Signed)
Tried to contact patient's son but no answer.

## 2013-10-19 NOTE — ED Notes (Signed)
Son called again, no answer

## 2013-10-26 ENCOUNTER — Inpatient Hospital Stay (HOSPITAL_COMMUNITY)
Admission: EM | Admit: 2013-10-26 | Discharge: 2013-10-31 | DRG: 291 | Disposition: A | Discharge status: Home / Self Care | Payer: Medicare Other | Attending: Internal Medicine | Admitting: Internal Medicine

## 2013-10-26 ENCOUNTER — Encounter (HOSPITAL_COMMUNITY): Payer: Self-pay | Admitting: Emergency Medicine

## 2013-10-26 ENCOUNTER — Emergency Department (HOSPITAL_COMMUNITY): Payer: Medicare Other

## 2013-10-26 DIAGNOSIS — I252 Old myocardial infarction: Secondary | ICD-10-CM

## 2013-10-26 DIAGNOSIS — I509 Heart failure, unspecified: Secondary | ICD-10-CM | POA: Diagnosis present

## 2013-10-26 DIAGNOSIS — L89609 Pressure ulcer of unspecified heel, unspecified stage: Secondary | ICD-10-CM | POA: Diagnosis present

## 2013-10-26 DIAGNOSIS — T380X5A Adverse effect of glucocorticoids and synthetic analogues, initial encounter: Secondary | ICD-10-CM | POA: Diagnosis present

## 2013-10-26 DIAGNOSIS — N2581 Secondary hyperparathyroidism of renal origin: Secondary | ICD-10-CM | POA: Diagnosis present

## 2013-10-26 DIAGNOSIS — I519 Heart disease, unspecified: Secondary | ICD-10-CM

## 2013-10-26 DIAGNOSIS — J441 Chronic obstructive pulmonary disease with (acute) exacerbation: Secondary | ICD-10-CM | POA: Diagnosis present

## 2013-10-26 DIAGNOSIS — I739 Peripheral vascular disease, unspecified: Secondary | ICD-10-CM | POA: Diagnosis present

## 2013-10-26 DIAGNOSIS — D649 Anemia, unspecified: Secondary | ICD-10-CM

## 2013-10-26 DIAGNOSIS — E871 Hypo-osmolality and hyponatremia: Secondary | ICD-10-CM | POA: Diagnosis not present

## 2013-10-26 DIAGNOSIS — E119 Type 2 diabetes mellitus without complications: Secondary | ICD-10-CM | POA: Diagnosis present

## 2013-10-26 DIAGNOSIS — E039 Hypothyroidism, unspecified: Secondary | ICD-10-CM | POA: Diagnosis present

## 2013-10-26 DIAGNOSIS — I2510 Atherosclerotic heart disease of native coronary artery without angina pectoris: Secondary | ICD-10-CM | POA: Diagnosis present

## 2013-10-26 DIAGNOSIS — Z9981 Dependence on supplemental oxygen: Secondary | ICD-10-CM

## 2013-10-26 DIAGNOSIS — L8990 Pressure ulcer of unspecified site, unspecified stage: Secondary | ICD-10-CM | POA: Diagnosis present

## 2013-10-26 DIAGNOSIS — Z87891 Personal history of nicotine dependence: Secondary | ICD-10-CM

## 2013-10-26 DIAGNOSIS — E7800 Pure hypercholesterolemia, unspecified: Secondary | ICD-10-CM | POA: Diagnosis present

## 2013-10-26 DIAGNOSIS — Z992 Dependence on renal dialysis: Secondary | ICD-10-CM

## 2013-10-26 DIAGNOSIS — J189 Pneumonia, unspecified organism: Secondary | ICD-10-CM | POA: Diagnosis present

## 2013-10-26 DIAGNOSIS — Z86711 Personal history of pulmonary embolism: Secondary | ICD-10-CM

## 2013-10-26 DIAGNOSIS — I120 Hypertensive chronic kidney disease with stage 5 chronic kidney disease or end stage renal disease: Secondary | ICD-10-CM | POA: Diagnosis present

## 2013-10-26 DIAGNOSIS — K219 Gastro-esophageal reflux disease without esophagitis: Secondary | ICD-10-CM | POA: Diagnosis present

## 2013-10-26 DIAGNOSIS — E785 Hyperlipidemia, unspecified: Secondary | ICD-10-CM | POA: Diagnosis present

## 2013-10-26 DIAGNOSIS — I2489 Other forms of acute ischemic heart disease: Secondary | ICD-10-CM | POA: Diagnosis present

## 2013-10-26 DIAGNOSIS — I248 Other forms of acute ischemic heart disease: Secondary | ICD-10-CM | POA: Diagnosis present

## 2013-10-26 DIAGNOSIS — I5033 Acute on chronic diastolic (congestive) heart failure: Principal | ICD-10-CM | POA: Diagnosis present

## 2013-10-26 DIAGNOSIS — D638 Anemia in other chronic diseases classified elsewhere: Secondary | ICD-10-CM | POA: Diagnosis present

## 2013-10-26 DIAGNOSIS — J9620 Acute and chronic respiratory failure, unspecified whether with hypoxia or hypercapnia: Secondary | ICD-10-CM | POA: Diagnosis present

## 2013-10-26 DIAGNOSIS — E669 Obesity, unspecified: Secondary | ICD-10-CM | POA: Diagnosis present

## 2013-10-26 DIAGNOSIS — N186 End stage renal disease: Secondary | ICD-10-CM | POA: Diagnosis present

## 2013-10-26 DIAGNOSIS — I10 Essential (primary) hypertension: Secondary | ICD-10-CM

## 2013-10-26 DIAGNOSIS — J9600 Acute respiratory failure, unspecified whether with hypoxia or hypercapnia: Secondary | ICD-10-CM

## 2013-10-26 DIAGNOSIS — J9610 Chronic respiratory failure, unspecified whether with hypoxia or hypercapnia: Secondary | ICD-10-CM | POA: Diagnosis present

## 2013-10-26 DIAGNOSIS — R748 Abnormal levels of other serum enzymes: Secondary | ICD-10-CM

## 2013-10-26 LAB — POCT I-STAT 3, ART BLOOD GAS (G3+)
Acid-Base Excess: 2 mmol/L (ref 0.0–2.0)
O2 Saturation: 94 %
TCO2: 30 mmol/L (ref 0–100)
pO2, Arterial: 73 mmHg — ABNORMAL LOW (ref 80.0–100.0)

## 2013-10-26 LAB — COMPREHENSIVE METABOLIC PANEL
ALT: 15 U/L (ref 0–35)
AST: 17 U/L (ref 0–37)
Albumin: 3.2 g/dL — ABNORMAL LOW (ref 3.5–5.2)
Alkaline Phosphatase: 114 U/L (ref 39–117)
Calcium: 8.3 mg/dL — ABNORMAL LOW (ref 8.4–10.5)
Chloride: 93 mEq/L — ABNORMAL LOW (ref 96–112)
GFR calc non Af Amer: 15 mL/min — ABNORMAL LOW (ref >90)
Glucose, Bld: 407 mg/dL — ABNORMAL HIGH (ref 70–99)
Potassium: 4 mEq/L (ref 3.5–5.1)
Sodium: 134 mEq/L — ABNORMAL LOW (ref 135–145)
Total Bilirubin: 0.3 mg/dL (ref 0.3–1.2)
Total Protein: 6.5 g/dL (ref 6.0–8.3)

## 2013-10-26 LAB — POCT I-STAT, CHEM 8
Calcium, Ion: 1.12 mmol/L — ABNORMAL LOW (ref 1.13–1.30)
Creatinine, Ser: 3 mg/dL — ABNORMAL HIGH (ref 0.50–1.10)
HCT: 32 % — ABNORMAL LOW (ref 36.0–46.0)
Hemoglobin: 10.9 g/dL — ABNORMAL LOW (ref 12.0–15.0)
Potassium: 3.2 mEq/L — ABNORMAL LOW (ref 3.5–5.1)
Sodium: 137 mEq/L (ref 135–145)
TCO2: 29 mmol/L (ref 0–100)

## 2013-10-26 LAB — CBC
HCT: 27.8 % — ABNORMAL LOW (ref 36.0–46.0)
Hemoglobin: 8.7 g/dL — ABNORMAL LOW (ref 12.0–15.0)
MCH: 29.8 pg (ref 26.0–34.0)
Platelets: 295 10*3/uL (ref 150–400)
RDW: 15.2 % (ref 11.5–15.5)
WBC: 12.9 10*3/uL — ABNORMAL HIGH (ref 4.0–10.5)

## 2013-10-26 LAB — CBC WITH DIFFERENTIAL/PLATELET
Basophils Absolute: 0.1 10*3/uL (ref 0.0–0.1)
Basophils Relative: 1 % (ref 0–1)
Eosinophils Absolute: 0.2 10*3/uL (ref 0.0–0.7)
MCH: 30 pg (ref 26.0–34.0)
MCHC: 31.1 g/dL (ref 30.0–36.0)
Monocytes Relative: 5 % (ref 3–12)
Neutrophils Relative %: 82 % — ABNORMAL HIGH (ref 43–77)
Platelets: 316 10*3/uL (ref 150–400)
WBC: 16.4 10*3/uL — ABNORMAL HIGH (ref 4.0–10.5)

## 2013-10-26 LAB — POCT I-STAT TROPONIN I

## 2013-10-26 LAB — PRO B NATRIURETIC PEPTIDE: Pro B Natriuretic peptide (BNP): 9552 pg/mL — ABNORMAL HIGH (ref 0–125)

## 2013-10-26 LAB — GLUCOSE, CAPILLARY: Glucose-Capillary: 441 mg/dL — ABNORMAL HIGH (ref 70–99)

## 2013-10-26 LAB — PHOSPHORUS: Phosphorus: 3.3 mg/dL (ref 2.3–4.6)

## 2013-10-26 MED ORDER — MORPHINE SULFATE 2 MG/ML IJ SOLN
1.0000 mg | INTRAMUSCULAR | Status: DC | PRN
  Administered 2013-10-30: 1 mg via INTRAVENOUS
  Filled 2013-10-26: qty 1

## 2013-10-26 MED ORDER — ALBUTEROL SULFATE (5 MG/ML) 0.5% IN NEBU
2.5000 mg | INHALATION_SOLUTION | Freq: Three times a day (TID) | RESPIRATORY_TRACT | Status: DC
  Administered 2013-10-27 – 2013-10-31 (×12): 2.5 mg via RESPIRATORY_TRACT
  Filled 2013-10-26 (×11): qty 0.5
  Filled 2013-10-26: qty 3

## 2013-10-26 MED ORDER — CEFEPIME HCL 2 G IJ SOLR
2.0000 g | INTRAMUSCULAR | Status: DC
  Administered 2013-10-26: 2 g via INTRAVENOUS
  Filled 2013-10-26: qty 2

## 2013-10-26 MED ORDER — SODIUM CHLORIDE 0.9 % IJ SOLN
3.0000 mL | Freq: Two times a day (BID) | INTRAMUSCULAR | Status: DC
  Administered 2013-10-26 – 2013-10-31 (×10): 3 mL via INTRAVENOUS

## 2013-10-26 MED ORDER — IPRATROPIUM BROMIDE 0.02 % IN SOLN
0.5000 mg | Freq: Four times a day (QID) | RESPIRATORY_TRACT | Status: DC
  Administered 2013-10-26: 0.5 mg via RESPIRATORY_TRACT
  Filled 2013-10-26: qty 2.5

## 2013-10-26 MED ORDER — HEPARIN SODIUM (PORCINE) 5000 UNIT/ML IJ SOLN: 5000.0000 [IU] | Freq: Three times a day (TID) | INTRAMUSCULAR | Status: DC

## 2013-10-26 MED ORDER — ACETAMINOPHEN 650 MG RE SUPP: 650.0000 mg | Freq: Four times a day (QID) | RECTAL | Status: DC | PRN

## 2013-10-26 MED ORDER — VANCOMYCIN HCL IN DEXTROSE 1-5 GM/200ML-% IV SOLN
1000.0000 mg | Freq: Once | INTRAVENOUS | Status: AC
  Administered 2013-10-26: 1000 mg via INTRAVENOUS
  Filled 2013-10-26: qty 200

## 2013-10-26 MED ORDER — INSULIN ASPART 100 UNIT/ML ~~LOC~~ SOLN: 0.0000 [IU] | Freq: Three times a day (TID) | SUBCUTANEOUS | Status: DC

## 2013-10-26 MED ORDER — BUDESONIDE 0.25 MG/2ML IN SUSP
0.2500 mg | Freq: Two times a day (BID) | RESPIRATORY_TRACT | Status: DC
  Administered 2013-10-26 – 2013-10-31 (×7): 0.25 mg via RESPIRATORY_TRACT
  Filled 2013-10-26 (×12): qty 2

## 2013-10-26 MED ORDER — DOXERCALCIFEROL 4 MCG/2ML IV SOLN
2.0000 ug | INTRAVENOUS | Status: DC
  Administered 2013-10-26 – 2013-10-28 (×2): 2 ug via INTRAVENOUS
  Filled 2013-10-26 (×2): qty 2

## 2013-10-26 MED ORDER — METHYLPREDNISOLONE SODIUM SUCC 125 MG IJ SOLR
60.0000 mg | Freq: Two times a day (BID) | INTRAMUSCULAR | Status: DC
  Administered 2013-10-26 – 2013-10-27 (×2): 60 mg via INTRAVENOUS
  Filled 2013-10-26 (×4): qty 0.96

## 2013-10-26 MED ORDER — ISOSORBIDE MONONITRATE ER 60 MG PO TB24
120.0000 mg | ORAL_TABLET | Freq: Every day | ORAL | Status: DC
  Administered 2013-10-27 – 2013-10-31 (×5): 120 mg via ORAL
  Filled 2013-10-26 (×6): qty 2

## 2013-10-26 MED ORDER — ONDANSETRON HCL 4 MG PO TABS: 4.0000 mg | ORAL_TABLET | Freq: Four times a day (QID) | ORAL | Status: DC | PRN

## 2013-10-26 MED ORDER — NITROGLYCERIN 0.4 MG SL SUBL: 0.4000 mg | SUBLINGUAL_TABLET | SUBLINGUAL | Status: DC | PRN

## 2013-10-26 MED ORDER — ALBUTEROL SULFATE (5 MG/ML) 0.5% IN NEBU
2.5000 mg | INHALATION_SOLUTION | Freq: Four times a day (QID) | RESPIRATORY_TRACT | Status: DC | PRN
  Administered 2013-10-28: 2.5 mg via RESPIRATORY_TRACT
  Filled 2013-10-26: qty 0.5

## 2013-10-26 MED ORDER — INSULIN GLARGINE 100 UNIT/ML ~~LOC~~ SOLN
20.0000 [IU] | Freq: Every day | SUBCUTANEOUS | Status: DC
  Administered 2013-10-26: 20 [IU] via SUBCUTANEOUS
  Filled 2013-10-26 (×2): qty 0.2

## 2013-10-26 MED ORDER — HEPARIN SODIUM (PORCINE) 5000 UNIT/ML IJ SOLN
5000.0000 [IU] | Freq: Three times a day (TID) | INTRAMUSCULAR | Status: DC
  Administered 2013-10-26 – 2013-10-31 (×16): 5000 [IU] via SUBCUTANEOUS
  Filled 2013-10-26 (×19): qty 1

## 2013-10-26 MED ORDER — SODIUM CHLORIDE 0.9 % IJ SOLN: 3.0000 mL | INTRAMUSCULAR | Status: DC | PRN

## 2013-10-26 MED ORDER — DARBEPOETIN ALFA-POLYSORBATE 40 MCG/0.4ML IJ SOLN
40.0000 ug | INTRAMUSCULAR | Status: DC
  Administered 2013-10-28: 40 ug via INTRAVENOUS
  Filled 2013-10-26: qty 0.4

## 2013-10-26 MED ORDER — DOXERCALCIFEROL 4 MCG/2ML IV SOLN
INTRAVENOUS | Status: AC
  Filled 2013-10-26: qty 2

## 2013-10-26 MED ORDER — FUROSEMIDE 10 MG/ML IJ SOLN
40.0000 mg | Freq: Once | INTRAMUSCULAR | Status: AC
  Administered 2013-10-26: 40 mg via INTRAVENOUS
  Filled 2013-10-26: qty 4

## 2013-10-26 MED ORDER — METOPROLOL TARTRATE 12.5 MG HALF TABLET
12.5000 mg | ORAL_TABLET | Freq: Every day | ORAL | Status: DC
  Administered 2013-10-27 – 2013-10-31 (×5): 12.5 mg via ORAL
  Filled 2013-10-26 (×5): qty 1

## 2013-10-26 MED ORDER — RENA-VITE PO TABS
1.0000 | ORAL_TABLET | Freq: Every day | ORAL | Status: DC
  Administered 2013-10-26 – 2013-10-28 (×3): 1 via ORAL
  Administered 2013-10-29: 23:00:00 via ORAL
  Administered 2013-10-30: 1 via ORAL
  Filled 2013-10-26 (×7): qty 1

## 2013-10-26 MED ORDER — PIPERACILLIN-TAZOBACTAM 3.375 G IVPB 30 MIN
3.3750 g | Freq: Once | INTRAVENOUS | Status: AC
  Administered 2013-10-26: 3.375 g via INTRAVENOUS
  Filled 2013-10-26: qty 50

## 2013-10-26 MED ORDER — SODIUM CHLORIDE 0.9 % IV SOLN: 250.0000 mL | INTRAVENOUS | Status: DC | PRN

## 2013-10-26 MED ORDER — ONDANSETRON HCL 4 MG/2ML IJ SOLN: 4.0000 mg | Freq: Four times a day (QID) | INTRAMUSCULAR | Status: DC | PRN

## 2013-10-26 MED ORDER — GUAIFENESIN-DM 100-10 MG/5ML PO SYRP
5.0000 mL | ORAL_SOLUTION | ORAL | Status: DC | PRN
  Administered 2013-10-28 – 2013-10-30 (×2): 5 mL via ORAL
  Filled 2013-10-26 (×3): qty 5

## 2013-10-26 MED ORDER — LEVOTHYROXINE SODIUM 25 MCG PO TABS
25.0000 ug | ORAL_TABLET | Freq: Every day | ORAL | Status: DC
  Administered 2013-10-27 – 2013-10-31 (×5): 25 ug via ORAL
  Filled 2013-10-26 (×7): qty 1

## 2013-10-26 MED ORDER — AMLODIPINE BESYLATE 10 MG PO TABS
10.0000 mg | ORAL_TABLET | Freq: Every day | ORAL | Status: DC
  Administered 2013-10-26: 10 mg via ORAL
  Filled 2013-10-26 (×2): qty 1

## 2013-10-26 MED ORDER — ACETAMINOPHEN 325 MG PO TABS
650.0000 mg | ORAL_TABLET | Freq: Four times a day (QID) | ORAL | Status: DC | PRN
  Administered 2013-10-27: 650 mg via ORAL
  Filled 2013-10-26: qty 2

## 2013-10-26 MED ORDER — CALCIUM ACETATE 667 MG PO CAPS
667.0000 mg | ORAL_CAPSULE | Freq: Three times a day (TID) | ORAL | Status: DC
  Administered 2013-10-27 – 2013-10-31 (×12): 667 mg via ORAL
  Filled 2013-10-26 (×17): qty 1

## 2013-10-26 MED ORDER — PANTOPRAZOLE SODIUM 40 MG PO TBEC
40.0000 mg | DELAYED_RELEASE_TABLET | Freq: Every day | ORAL | Status: DC
  Administered 2013-10-26 – 2013-10-31 (×6): 40 mg via ORAL
  Filled 2013-10-26 (×5): qty 1

## 2013-10-26 MED ORDER — ASPIRIN EC 81 MG PO TBEC
81.0000 mg | DELAYED_RELEASE_TABLET | Freq: Every day | ORAL | Status: DC
  Administered 2013-10-26 – 2013-10-31 (×6): 81 mg via ORAL
  Filled 2013-10-26 (×6): qty 1

## 2013-10-26 MED ORDER — ALBUTEROL SULFATE (5 MG/ML) 0.5% IN NEBU: 2.5000 mg | INHALATION_SOLUTION | Freq: Two times a day (BID) | RESPIRATORY_TRACT | Status: DC

## 2013-10-26 MED ORDER — ALBUTEROL SULFATE (5 MG/ML) 0.5% IN NEBU
2.5000 mg | INHALATION_SOLUTION | Freq: Four times a day (QID) | RESPIRATORY_TRACT | Status: DC
  Administered 2013-10-26: 2.5 mg via RESPIRATORY_TRACT
  Filled 2013-10-26: qty 0.5

## 2013-10-26 MED ORDER — IPRATROPIUM BROMIDE 0.02 % IN SOLN
0.5000 mg | Freq: Three times a day (TID) | RESPIRATORY_TRACT | Status: DC
  Administered 2013-10-27 – 2013-10-31 (×12): 0.5 mg via RESPIRATORY_TRACT
  Filled 2013-10-26 (×12): qty 2.5

## 2013-10-26 MED ORDER — SIMVASTATIN 10 MG PO TABS
10.0000 mg | ORAL_TABLET | Freq: Every evening | ORAL | Status: DC
  Administered 2013-10-26 – 2013-10-30 (×5): 10 mg via ORAL
  Filled 2013-10-26 (×7): qty 1

## 2013-10-26 MED ORDER — SODIUM CHLORIDE 0.9 % IV SOLN
125.0000 mg | INTRAVENOUS | Status: DC
  Administered 2013-10-28: 125 mg via INTRAVENOUS
  Filled 2013-10-26 (×2): qty 10

## 2013-10-26 MED ORDER — ALBUTEROL SULFATE (5 MG/ML) 0.5% IN NEBU: 2.5000 mg | INHALATION_SOLUTION | RESPIRATORY_TRACT | Status: DC | PRN

## 2013-10-26 NOTE — ED Notes (Signed)
Pt. Placed back on non-rebreather due to O2 sat dropping to 80s on [place]

## 2013-10-26 NOTE — ED Notes (Signed)
EKG given to Dr. Palumbo.  

## 2013-10-26 NOTE — Progress Notes (Signed)
ANTIBIOTIC CONSULT NOTE - INITIAL  Pharmacy Consult for Vancomycin / cefepime Indication: rule out pneumonia  Allergies  Allergen Reactions  . Procaine Hcl Nausea And Vomiting and Other (See Comments)    "Pass out"    Patient Measurements: Height: 5\' 5"  (165.1 cm) Weight: 149 lb 0.5 oz (67.6 kg) IBW/kg (Calculated) : 57   Vital Signs: Temp: 97.7 F (36.5 C) (12/24 1510) Temp src: Oral (12/24 1510) BP: 117/66 mmHg (12/24 1510) Pulse Rate: 112 (12/24 1510) Intake/Output from previous day:   Intake/Output from this shift: Total I/O In: -  Out: 2824 [Urine:300; Other:2524]  Labs:  Recent Labs  10/26/13 0510 10/26/13 0515 10/26/13 1048  WBC 16.4*  --  12.9*  HGB 10.0* 10.9* 8.7*  PLT 316  --  295  CREATININE  --  3.00* 2.99*   Estimated Creatinine Clearance: 15.8 ml/min (by C-G formula based on Cr of 2.99). No results found for this basename: VANCOTROUGH, Leodis Binet, VANCORANDOM, GENTTROUGH, GENTPEAK, GENTRANDOM, TOBRATROUGH, TOBRAPEAK, TOBRARND, AMIKACINPEAK, AMIKACINTROU, AMIKACIN,  in the last 72 hours   Microbiology: Recent Results (from the past 720 hour(s))  MRSA PCR SCREENING     Status: None   Collection Time    10/26/13  3:11 PM      Result Value Range Status   MRSA by PCR NEGATIVE  NEGATIVE Final   Comment:            The GeneXpert MRSA Assay (FDA     approved for NASAL specimens     only), is one component of a     comprehensive MRSA colonization     surveillance program. It is not     intended to diagnose MRSA     infection nor to guide or     monitor treatment for     MRSA infections.    Medical History: Past Medical History  Diagnosis Date  . Diabetes mellitus   . Hypercholesterolemia   . Hypothyroidism   . Colitis   . Coronary artery disease   . Hypertension   . GERD (gastroesophageal reflux disease)   . Myocardial infarction   . Peripheral vascular disease   . Pneumonia   . Angina   . H/O Clostridium difficile infection   . Hx  of pulmonary embolus   . COPD (chronic obstructive pulmonary disease)   . Shortness of breath     uses O2 as needed  . Chronic kidney disease     M-W-F   [place]  . Critical lower limb ischemia   . Claudication     Assessment: 72yof is ESRD and admitted with SOB.  She is being started on broad spectrum ABX to r/o pna.  She received vancomycin 1gm IV X1 in ED prior to HD today.    Goal of Therapy:  Vancomycin trough level 15-20 mcg/ml  Plan:  Vancomycin 1gm x1 s/p HD  Check preHD level with next HD to redose Cefepime 2gm qHD start tonight  Leota Sauers Pharm.D. CPP, BCPS Clinical Pharmacist [phone] 10/26/2013 7:03 PM

## 2013-10-26 NOTE — Procedures (Signed)
I was present at this session.  I have reviewed the session itself and made appropriate changes.  HD via LIJ cath, will lower vol 4 liters, check labs.  Terrell Shimko L 12/24/201410:27 AM

## 2013-10-26 NOTE — Consult Note (Signed)
[facility] Renal Consultation Note    Indication for Consultation:  Management of ESRD/hemodialysis; anemia, hypertension/volume and secondary hyperparathyroidism  HPI: [name] [name] is a 72 y.o. female ESRD patient (MWF HD) with past medical history significant for DM, hypertension, hypothyroidism, COPD who presented to the ED with complaints of shortness of breath for the past two weeks, unrelieved by home nebs. Upon EMS arrival her oxygen saturations were in the 70's and improved to the 90's after albuterol and atrovent, however she still required a non-rebreather at 10L. She was ultimately placed on BiPAP in the ED. K+ is 3.2, systolic blood pressures in the 160's and she has bilateral pleural effusions on chest xray.  Her last outpatient hemodialysis session was yesterday and she attained her goal, but given her comorbid medical conditions and current state, she likely needs a lower dry weight. At the time of this encounter, she is resting comfortably on BiPAP, complaining only of weakness. She denies pain or other emerging complaints. She is somewhat of a poor historian - this history is taken from notes and outpatient hemodialysis records.  Past Medical History  Diagnosis Date  . Diabetes mellitus   . Hypercholesterolemia   . Hypothyroidism   . Colitis   . Coronary artery disease   . Hypertension   . GERD (gastroesophageal reflux disease)   . Myocardial infarction   . Peripheral vascular disease   . Pneumonia   . Angina   . H/O Clostridium difficile infection   . Hx of pulmonary embolus   . COPD (chronic obstructive pulmonary disease)   . Shortness of breath     uses O2 as needed  . Chronic kidney disease     M-W-F   [place]  . Critical lower limb ischemia   . Claudication    Past Surgical History  Procedure Laterality Date  . Tubal ligation    . Amputaton of right fifth toe    . Tonsillectomy    . Insertion of dialysis catheter    . Av fistula placement   [id number]    right RUE AVF  . Bvt  May 18, 2012    First stage, Basilic Vein Transposition  . Bvt  07/27/12    Left arm Basilic Vein Transposition  . Arch aortogram  08/19/2012    Procedure: ARCH AORTOGRAM;  Surgeon: Fransisco Hertz, MD;  Location: [facility] OR;  Service: Vascular;  Laterality: N/A;  . Insertion of dialysis catheter  08/21/2012    Procedure: INSERTION OF DIALYSIS CATHETER;  Surgeon: Sherren Kerns, MD;  Location: [facility] OR;  Service: Vascular;  Laterality: Left;  internal jugular   Family History  Problem Relation Age of Onset  . Diabetes type II    . Hypertension    . Diabetes Mother   . Kidney disease Mother   . Diabetes Father   . Anesthesia problems Neg Hx    Social History:  reports that she quit smoking about 2 years ago. Her smoking use included Cigarettes. She has a 40 pack-year smoking history. She has never used smokeless tobacco. She reports that she does not drink alcohol or use illicit drugs. Allergies  Allergen Reactions  . Procaine Hcl Nausea And Vomiting and Other (See Comments)    "Pass out"   Prior to Admission medications   Medication Sig Start Date End Date Taking? Authorizing Provider  amLODipine (NORVASC) 10 MG tablet Take 10 mg by mouth daily.  04/20/12   Historical Provider, MD  aspirin EC 81 MG  tablet Take 81 mg by mouth daily.    Historical Provider, MD  calcium acetate (PHOSLO) 667 MG capsule Take 667 mg by mouth 3 (three) times daily with meals.    Historical Provider, MD  insulin glargine (LANTUS) 100 UNIT/ML injection Inject 25 Units into the skin at bedtime.  11/01/11   Clydia Llano, MD  insulin NPH (HUMULIN N,NOVOLIN N) 100 UNIT/ML injection Inject 1-10 Units into the skin 3 (three) times daily after meals. Sliding scale    Historical Provider, MD  isosorbide mononitrate (IMDUR) 120 MG 24 hr tablet Take 120 mg by mouth daily.     Historical Provider, MD  levothyroxine (SYNTHROID, LEVOTHROID) 25 MCG tablet Take 25 mcg by mouth daily.     Historical  Provider, MD  metoprolol tartrate (LOPRESSOR) 25 MG tablet Take 12.5 mg by mouth daily.    Historical Provider, MD  nitroGLYCERIN (NITROSTAT) 0.4 MG SL tablet Place 0.4 mg under the tongue every 5 (five) minutes as needed. For chest pain.    Historical Provider, MD  pantoprazole (PROTONIX) 40 MG tablet Take 40 mg by mouth daily.     Historical Provider, MD  Probiotic Product (PROBIOTIC PO) Take 1 capsule by mouth daily as needed (digestion).     Historical Provider, MD  simvastatin (ZOCOR) 10 MG tablet Take 10 mg by mouth every evening.    Historical Provider, MD   Current Facility-Administered Medications  Medication Dose Route Frequency Provider Last Rate Last Dose  . vancomycin (VANCOCIN) IVPB 1000 mg/200 mL premix  1,000 mg Intravenous Once April K Palumbo-Rasch, MD 200 mL/hr at 10/26/13 0852 1,000 mg at 10/26/13 4098   Current Outpatient Prescriptions  Medication Sig Dispense Refill  . amLODipine (NORVASC) 10 MG tablet Take 10 mg by mouth daily.       Marland Kitchen aspirin EC 81 MG tablet Take 81 mg by mouth daily.      . calcium acetate (PHOSLO) 667 MG capsule Take 667 mg by mouth 3 (three) times daily with meals.      . insulin glargine (LANTUS) 100 UNIT/ML injection Inject 25 Units into the skin at bedtime.       . insulin NPH (HUMULIN N,NOVOLIN N) 100 UNIT/ML injection Inject 1-10 Units into the skin 3 (three) times daily after meals. Sliding scale      . isosorbide mononitrate (IMDUR) 120 MG 24 hr tablet Take 120 mg by mouth daily.       Marland Kitchen levothyroxine (SYNTHROID, LEVOTHROID) 25 MCG tablet Take 25 mcg by mouth daily.       . metoprolol tartrate (LOPRESSOR) 25 MG tablet Take 12.5 mg by mouth daily.      . nitroGLYCERIN (NITROSTAT) 0.4 MG SL tablet Place 0.4 mg under the tongue every 5 (five) minutes as needed. For chest pain.      . pantoprazole (PROTONIX) 40 MG tablet Take 40 mg by mouth daily.       . Probiotic Product (PROBIOTIC PO) Take 1 capsule by mouth daily as needed (digestion).        . simvastatin (ZOCOR) 10 MG tablet Take 10 mg by mouth every evening.      . [DISCONTINUED] enoxaparin (LOVENOX) 60 MG/0.6ML SOLN Inject 0.55 mLs (55 mg total) into the skin daily.       Labs: Basic Metabolic Panel:  Recent Labs Lab 10/26/13 0515  NA 137  K 3.2*  CL 96  GLUCOSE 198*  BUN 17  CREATININE 3.00*   CBC:  Recent Labs Lab 10/26/13 0510  10/26/13 0515  WBC 16.4*  --   NEUTROABS 13.4*  --   HGB 10.0* 10.9*  HCT 32.2* 32.0*  MCV 96.7  --   PLT 316  --    Studies/Results: Dg Chest 2 View  10/26/2013   CLINICAL DATA:  Shortness of breath; patient on dialysis.  EXAM: CHEST  2 VIEW  COMPARISON:  Chest radiograph performed 10/19/2013  FINDINGS: Small bilateral pleural effusions are seen, new from the prior study, with bibasilar airspace opacities. Underlying vascular congestion is seen. This raises concern for mild interstitial edema. No pneumothorax is seen.  The cardiomediastinal silhouette is mildly enlarged. A left-sided dual lumen catheter is noted ending about the cavoatrial junction. No acute osseous abnormalities are seen.  IMPRESSION: Small bilateral pleural effusions with bibasilar airspace opacities. Underlying vascular congestion and mild cardiomegaly. Findings concerning for mild interstitial edema.   Electronically Signed   By: Roanna Raider M.D.   On: 10/26/2013 06:11    ROS: Weakness. 10 pt ROS asked and answered. All systems negative except as in the HPI above.   Physical Exam: Filed Vitals:   10/26/13 0700 10/26/13 0730 10/26/13 0800 10/26/13 0830  BP: 145/54 140/57 148/57 160/60  Pulse: 106 99 96 99  Temp:      Resp: 17 17 16 18   SpO2: 95% 93% 94% 96%     General: Elderly, chronically ill-appearing, fatigued. Head: Normocephalic, atraumatic, sclera non-icteric, mucus membranes are moist Neck: Supple. JVD not elevated. Lungs: On BiPAP, scattered wheezes, decreased at bases. No rales or rhonchi. Some work of breathing. Sats 95%. Heart: Tachy  regular. No murmurs, rubs, or gallops appreciated. Abdomen: Soft, non-tender, distended, with normoactive bowel sounds. No rebound/guarding. No obvious abdominal masses. M-S:  Strength and tone appear normal for age. Lower extremities: Trace pretibial edema. No ischemic changes, no open wounds  Neuro: Alert and oriented X 3. Moves all extremities spontaneously. Psych:  Responds to questions appropriately with a normal affect. Dialysis Access: L [facility]  Dialysis Orders: Center: [facility] on MWF . F 160 EDW 68 kg HD Bath 2K/2.5Ca Time 3:30 Heparin 0. Access L TDC BFR 400 DFR 800    Hectorol 0 mcg IV/HD Epogen 5000   Units IV/HD  Venofer  100 mg IV/HD/ 3 doses remaining, then 50 mg weekly Recent labs: Hgb 9.0, Tsat 19%, P 4.1, PTH 334  Assessment/Plan: 1. Respiratory Distress/ Volume excess - Work-up per admit pending. Afebrile. On empiric vanc/zosyn. Requiring BiBAP to maintain Sats. Bilateral pleural effusions on CXR. For urgent HD this a.m. Has lost LBM , lower dry needed.  Will take off 4 liters and f/u CXR and cont to lower. Needs longer HD, and lower UFR 2. ESRD -  MWF, K+ 3.2. Dialyzed op yesterday on holiday schedule. HD today per #1 above. Added K+ bath. 3. Hypertension/volume  - SBPs 160s, Home meds are Norvasc 10 and Lopressor 25 BID. Volume excess per #1 above. UF 3-4L as tolerated today. Re-eval post HD, may need an additional treatment. Lower EDW.Lower meds with lower wgt 4. Anemia  - Hgb 10.9 > 9 outpatient. Epo recently increased. Aranesp 40 ordered for maintenance q Fri. Fe Load in progress, with 3 doses remaining. Just rec'd yesterday. Will order for Friday as well if still admitted. 5. Metabolic bone disease -  Ca and Phos controlled op. Last PTH 334 - not on Vit D. Will start Hectorol 2 mcg here. Continue Phoslo. 6. Nutrition - NPO for now. Renal diet when appropriate, multivitamin 7. COPD - mgmt  per primary A part of primary issue 8. CAD/ Hx MI - home meds 9. Hypothyroidism - continue  synthroid 10. DM - on insulin  Claud Kelp, PA-C [facility] Pager [phone]. 10/26/2013, 9:15 AM I have seen and examined this patient and agree with the plan of care seen, examined,counseled,orders changed. Will do full labs, and needs modification of regimen .  Sotirios Navarro L 10/26/2013, 10:31 AM

## 2013-10-26 NOTE — Progress Notes (Signed)
Pt admitted from Hemodialysis, oriented to unit, safety and pain management discussed, pt and family at bedside verbalize understanding.  MD notified of admission and new orders obtained

## 2013-10-26 NOTE — H&P (Signed)
[facility] History and Physical  [name] [name] XBJ:[id number] DOB: 10-11-1941 DOA: 10/26/2013  Referring physician: Dr. Nicanor Alcon PCP: Quentin Mulling, MD   Chief Complaint: Shortness of breath and hypoxia  HPI: [name] [name] is a 72 y.o. female with a past medical history significant for diabetes, hypercholesterolemia, hypothyroidism, hypertension, end-stage renal disease on hemodialysis, chronic diastolic heart failure and chronic respiratory failure secondary to COPD (on chronic oxygen supplementation 3 L at home); came to ED secondary to increased shortness of breath, cough orthopnea and hypoxia. Patient reports that her symptoms has been present for the last 2 weeks and is steadily progressing and worsening, symptoms are unrelieved by home nebulizer treatments. At home when EMS arrived to pick her at patient was found with oxygen saturation in the 70s while using her 3 L of oxygen. Patient was transfer to a major department for further evaluation and treatment after receiving some albuterol and Atrovent; in the ED patient requires nonrebreather at 10 L for oxygen saturation in the 90s with subsequent requirement of BiPAP to maintain oxygen saturation. A chest x-ray demonstrated bilateral opacities with concerns for pneumonia and vascular congestion with CHF findings. Patient was actively wheezing and [facility] has been called to admit the patient for further evaluation and treatment. Patient received IV antibiotics in the ED for healthcare associated pneumonia renal service was also contacted for hemodialysis. Patient denies chest pain, fever/chills, abdominal pain, nausea/vomiting, hematemesis, melena, or any other acute complaints.    Review of Systems:  Negative except as otherwise mentioned on history of present illness.  Past Medical History  Diagnosis Date  . Diabetes mellitus   . Hypercholesterolemia   . Hypothyroidism   . Colitis   . Coronary artery disease   .  Hypertension   . GERD (gastroesophageal reflux disease)   . Myocardial infarction   . Peripheral vascular disease   . Pneumonia   . Angina   . H/O Clostridium difficile infection   . Hx of pulmonary embolus   . COPD (chronic obstructive pulmonary disease)   . Shortness of breath     uses O2 as needed  . Chronic kidney disease     M-W-F   [place]  . Critical lower limb ischemia   . Claudication    Past Surgical History  Procedure Laterality Date  . Tubal ligation    . Amputaton of right fifth toe    . Tonsillectomy    . Insertion of dialysis catheter    . Av fistula placement  [id number]    right RUE AVF  . Bvt  May 18, 2012    First stage, Basilic Vein Transposition  . Bvt  07/27/12    Left arm Basilic Vein Transposition  . Arch aortogram  08/19/2012    Procedure: ARCH AORTOGRAM;  Surgeon: Fransisco Hertz, MD;  Location: [facility] OR;  Service: Vascular;  Laterality: N/A;  . Insertion of dialysis catheter  08/21/2012    Procedure: INSERTION OF DIALYSIS CATHETER;  Surgeon: Sherren Kerns, MD;  Location: [facility] OR;  Service: Vascular;  Laterality: Left;  internal jugular   Social History:  reports that she quit smoking about 2 years ago. Her smoking use included Cigarettes. She has a 40 pack-year smoking history. She has never used smokeless tobacco. She reports that she does not drink alcohol or use illicit drugs.  Allergies  Allergen Reactions  . Procaine Hcl Nausea And Vomiting and Other (See Comments)    "Pass out"    Family History  Problem Relation Age of Onset  . Diabetes type II    . Hypertension    . Diabetes Mother   . Kidney disease Mother   . Diabetes Father   . Anesthesia problems Neg Hx      Prior to Admission medications   Medication Sig Start Date End Date Taking? Authorizing Provider  amLODipine (NORVASC) 10 MG tablet Take 10 mg by mouth daily.  04/20/12  Yes Historical Provider, MD  aspirin EC 81 MG tablet Take 81 mg by mouth daily.   Yes Historical Provider, MD   calcium acetate (PHOSLO) 667 MG capsule Take 667 mg by mouth 3 (three) times daily with meals.   Yes Historical Provider, MD  insulin glargine (LANTUS) 100 UNIT/ML injection Inject 25 Units into the skin at bedtime.  11/01/11  Yes Clydia Llano, MD  isosorbide mononitrate (IMDUR) 120 MG 24 hr tablet Take 120 mg by mouth daily.    Yes Historical Provider, MD  levothyroxine (SYNTHROID, LEVOTHROID) 25 MCG tablet Take 25 mcg by mouth daily.    Yes Historical Provider, MD  metoprolol tartrate (LOPRESSOR) 25 MG tablet Take 12.5 mg by mouth daily.   Yes Historical Provider, MD  nitroGLYCERIN (NITROSTAT) 0.4 MG SL tablet Place 0.4 mg under the tongue every 5 (five) minutes as needed. For chest pain.   Yes Historical Provider, MD  NOVOLIN R RELION 100 UNIT/ML injection Inject into the skin 3 (three) times daily. Sliding scale 10/15/13  Yes Historical Provider, MD  pantoprazole (PROTONIX) 40 MG tablet Take 40 mg by mouth daily.    Yes Historical Provider, MD  Probiotic Product (PROBIOTIC PO) Take 1 capsule by mouth daily as needed (digestion).    Yes Historical Provider, MD  simvastatin (ZOCOR) 10 MG tablet Take 10 mg by mouth every evening.   Yes Historical Provider, MD   Physical Exam: Filed Vitals:   10/26/13 1510  BP: 117/66  Pulse: 112  Temp: 97.7 F (36.5 C)  Resp: 22    BP 117/66  Pulse 112  Temp(Src) 97.7 F (36.5 C) (Oral)  Resp 22  Ht 5\' 5"  (1.651 m)  Wt 67.6 kg (149 lb 0.5 oz)  BMI 24.80 kg/m2  SpO2 91%  General: Elderly, chronically ill-appearing, fatigued.  Head: Normocephalic, atraumatic, sclera non-icteric, mucus membranes are moist  Neck: Supple. JVD not elevated.  Lungs: On BiPAP, diffuse expiratory wheezes, decreased breath sounds at bases with fine crackles. Some work of breathing.   Heart: Tachy regular. No murmurs, rubs, or gallops appreciated.  Abdomen: Soft, non-tender, distended, with normoactive bowel sounds. No rebound/guarding. No obvious abdominal masses.  M-S:  Strength and tone appear normal for age.  Lower extremities: Trace pretibial edema. No ischemic changes, no open wounds  Neuro: Alert and oriented X 3. Moves all extremities spontaneously.  Psych: Responds to questions appropriately with a normal affect.  Dialysis Access: L TDC           Labs on Admission:  Basic Metabolic Panel:  Recent Labs Lab 10/26/13 0515 10/26/13 1048  NA 137 134*  K 3.2* 4.0  CL 96 93*  CO2  --  29  GLUCOSE 198* 407*  BUN 17 20  CREATININE 3.00* 2.99*  CALCIUM  --  8.3*  PHOS  --  3.3   Liver Function Tests:  Recent Labs Lab 10/26/13 1048  AST 17  ALT 15  ALKPHOS 114  BILITOT 0.3  PROT 6.5  ALBUMIN 3.2*   CBC:  Recent Labs Lab 10/26/13 0510 10/26/13 0515  10/26/13 1048  WBC 16.4*  --  12.9*  NEUTROABS 13.4*  --   --   HGB 10.0* 10.9* 8.7*  HCT 32.2* 32.0* 27.8*  MCV 96.7  --  95.2  PLT 316  --  295   BNP (last 3 results)  Recent Labs  10/26/13 0510  PROBNP 9552.0*   CBG:  Recent Labs Lab 10/26/13 0926 10/26/13 1728  GLUCAP 328* 247*    Radiological Exams on Admission: Dg Chest 2 View  10/26/2013   CLINICAL DATA:  Shortness of breath; patient on dialysis.  EXAM: CHEST  2 VIEW  COMPARISON:  Chest radiograph performed 10/19/2013  FINDINGS: Small bilateral pleural effusions are seen, new from the prior study, with bibasilar airspace opacities. Underlying vascular congestion is seen. This raises concern for mild interstitial edema. No pneumothorax is seen.  The cardiomediastinal silhouette is mildly enlarged. A left-sided dual lumen catheter is noted ending about the cavoatrial junction. No acute osseous abnormalities are seen.  IMPRESSION: Small bilateral pleural effusions with bibasilar airspace opacities. Underlying vascular congestion and mild cardiomegaly. Findings concerning for mild interstitial edema.   Electronically Signed   By: Roanna Raider M.D.   On: 10/26/2013 06:11    EKG: Date: 10/26/2013  Rate: 112   Rhythm: sinus tachycardia  QRS Axis: normal  Intervals: normal  ST/T Wave abnormalities: nonspecific ST changes  Conduction Disutrbances:none  Narrative Interpretation: old anteroseptal infarct  Old EKG Reviewed: none available   Assessment/Plan 1-Acute-on-chronic respiratory failure: Multifactorial, secondary to acute on chronic diastolic heart failure, healthcare assess her pneumonia, COPD exacerbation. -Patient with oxygen saturation in the mid 70s on arrival and requiring BiPAP to stabilize her breathing status. Will admit to step down -Will contact renal service for immediate hemodialysis as patient is now depending on this procedure to control her volume status -Will start active treatment for her COPD and healthcare assess her pneumonia with the use of albuterol/Atrovent nebulizer, Pulmicort nebulizer twice a day, oxygen supplementation, Solu Medrol 60 mg every 12 hours and antibiotics (vancomycin and cefepime adjusted by pharmacy) -Continue supportive care. -Patient is a full code and has expressed that she will like to be intubated if necessary  2-Hypothyroidism: Will check TSH and continue Synthroid.  3-HTN (hypertension): A stable. Will continue current medication regimen  4-chronic diastolic CHF (congestive heart failure): With acute exacerbation. Treatment as mentioned above. Last 2-D echo done in July 2014 demonstrating ejection fraction 60-65% and no wall motion on monitors.  5-ESRD on dialysis: Per renal service.  6-Diabetes: Will check hemoglobin A1c and continue Lantus and a sliding scale  7-elevated troponins: Most likely demand ischemia. -Patient denies chest pain -Will cycle troponins and check EKG -Patient will be follow on telemetry -Continue statins, aspirin, b- blocker and nitrates  8-secondary hyperparathyroidism and anemia of chronic disease: As per renal recommendations.  DVT: Heparin   Renal service (Dr. Darrick Penna)  Code Status: Full Family  Communication: no family at bedside Disposition Plan: inpatient, step down, LOS > 2 midnights  Time spent: 50 minutes  Jemel Ono [facility] Pager [phone]

## 2013-10-26 NOTE — ED Notes (Signed)
Pt. Is also dialysis patient. Had 2L drawn off this AM

## 2013-10-26 NOTE — ED Provider Notes (Signed)
CSN: [id number]     Arrival date & time 10/26/13  0417 History   First MD Initiated Contact with Patient 10/26/13 0526     Chief Complaint  Patient presents with  . Respiratory Distress   (Consider location/radiation/quality/duration/timing/severity/associated sxs/prior Treatment) Patient is a 72 y.o. female presenting with shortness of breath. The history is provided by the patient. No language interpreter was used.  Shortness of Breath Severity:  Severe Onset quality:  Gradual Timing:  Constant Progression:  Worsening Chronicity:  Recurrent Context: not activity   Relieved by:  Nothing Worsened by:  Nothing tried Ineffective treatments:  None tried Associated symptoms: wheezing   Associated symptoms: no fever   Risk factors: no recent surgery     Past Medical History  Diagnosis Date  . Diabetes mellitus   . Hypercholesterolemia   . Hypothyroidism   . Colitis   . Coronary artery disease   . Hypertension   . GERD (gastroesophageal reflux disease)   . Myocardial infarction   . Peripheral vascular disease   . Pneumonia   . Angina   . H/O Clostridium difficile infection   . Hx of pulmonary embolus   . COPD (chronic obstructive pulmonary disease)   . Shortness of breath     uses O2 as needed  . Chronic kidney disease     M-W-F   [place]  . Critical lower limb ischemia   . Claudication    Past Surgical History  Procedure Laterality Date  . Tubal ligation    . Amputaton of right fifth toe    . Tonsillectomy    . Insertion of dialysis catheter    . Av fistula placement  [id number]    right RUE AVF  . Bvt  May 18, 2012    First stage, Basilic Vein Transposition  . Bvt  07/27/12    Left arm Basilic Vein Transposition  . Arch aortogram  08/19/2012    Procedure: ARCH AORTOGRAM;  Surgeon: Fransisco Hertz, MD;  Location: [facility] OR;  Service: Vascular;  Laterality: N/A;  . Insertion of dialysis catheter  08/21/2012    Procedure: INSERTION OF DIALYSIS CATHETER;  Surgeon: Sherren Kerns, MD;  Location: [facility] OR;  Service: Vascular;  Laterality: Left;  internal jugular   Family History  Problem Relation Age of Onset  . Diabetes type II    . Hypertension    . Diabetes Mother   . Kidney disease Mother   . Diabetes Father   . Anesthesia problems Neg Hx    History  Substance Use Topics  . Smoking status: Former Smoker -- 1.00 packs/day for 40 years    Types: Cigarettes    Quit date: 06/11/2011  . Smokeless tobacco: Never Used  . Alcohol Use: No   OB History   Grav Para Term Preterm Abortions TAB SAB Ect Mult Living                 Review of Systems  Constitutional: Negative for fever.  Respiratory: Positive for shortness of breath and wheezing.   All other systems reviewed and are negative.    Allergies  Procaine hcl  Home Medications   Current Outpatient Rx  Name  Route  Sig  Dispense  Refill  . amLODipine (NORVASC) 10 MG tablet   Oral   Take 10 mg by mouth daily.          Marland Kitchen aspirin EC 81 MG tablet   Oral   Take 81 mg by mouth daily.         Marland Kitchen  calcium acetate (PHOSLO) 667 MG capsule   Oral   Take 667 mg by mouth 3 (three) times daily with meals.         . insulin glargine (LANTUS) 100 UNIT/ML injection   Subcutaneous   Inject 25 Units into the skin at bedtime.          . insulin NPH (HUMULIN N,NOVOLIN N) 100 UNIT/ML injection   Subcutaneous   Inject 1-10 Units into the skin 3 (three) times daily after meals. Sliding scale         . isosorbide mononitrate (IMDUR) 120 MG 24 hr tablet   Oral   Take 120 mg by mouth daily.          Marland Kitchen levothyroxine (SYNTHROID, LEVOTHROID) 25 MCG tablet   Oral   Take 25 mcg by mouth daily.          . metoprolol tartrate (LOPRESSOR) 25 MG tablet   Oral   Take 12.5 mg by mouth daily.         . nitroGLYCERIN (NITROSTAT) 0.4 MG SL tablet   Sublingual   Place 0.4 mg under the tongue every 5 (five) minutes as needed. For chest pain.         . pantoprazole (PROTONIX) 40 MG tablet    Oral   Take 40 mg by mouth daily.          . Probiotic Product (PROBIOTIC PO)   Oral   Take 1 capsule by mouth daily as needed (digestion).          . simvastatin (ZOCOR) 10 MG tablet   Oral   Take 10 mg by mouth every evening.          BP 129/57  Pulse 111  Temp(Src) 97.7 F (36.5 C)  Resp 24  SpO2 94% Physical Exam  Constitutional: She appears well-developed and well-nourished.  HENT:  Head: Normocephalic and atraumatic.  Mouth/Throat: Oropharynx is clear and moist.  Eyes: Conjunctivae are normal. Pupils are equal, round, and reactive to light.  Neck: Normal range of motion. Neck supple.  Cardiovascular: Regular rhythm.  Tachycardia present.   Pulmonary/Chest: Effort normal. She has wheezes. She has rales.  Abdominal: Soft. Bowel sounds are normal. There is no tenderness. There is no rebound and no guarding.  Musculoskeletal: Normal range of motion.  Neurological: She is alert.  Skin: Skin is warm and dry. She is not diaphoretic.  Psychiatric: She has a normal mood and affect.    ED Course  Procedures (including critical care time) Labs Review Labs Reviewed  CBC WITH DIFFERENTIAL - Abnormal; Notable for the following:    WBC 16.4 (*)    RBC 3.33 (*)    Hemoglobin 10.0 (*)    HCT 32.2 (*)    Neutrophils Relative % 82 (*)    Neutro Abs 13.4 (*)    All other components within normal limits  PRO B NATRIURETIC PEPTIDE - Abnormal; Notable for the following:    Pro B Natriuretic peptide (BNP) 9552.0 (*)    All other components within normal limits  POCT I-STAT, CHEM 8 - Abnormal; Notable for the following:    Potassium 3.2 (*)    Creatinine, Ser 3.00 (*)    Glucose, Bld 198 (*)    Calcium, Ion 1.12 (*)    Hemoglobin 10.9 (*)    HCT 32.0 (*)    All other components within normal limits  POCT I-STAT TROPONIN I - Abnormal; Notable for the following:    Troponin i,  poc 0.32 (*)    All other components within normal limits  POCT I-STAT 3, BLOOD GAS (G3+) -  Abnormal; Notable for the following:    pCO2 arterial 47.5 (*)    pO2, Arterial 73.0 (*)    Bicarbonate 28.1 (*)    All other components within normal limits  BLOOD GAS, ARTERIAL   Imaging Review Dg Chest 2 View  10/26/2013   CLINICAL DATA:  Shortness of breath; patient on dialysis.  EXAM: CHEST  2 VIEW  COMPARISON:  Chest radiograph performed 10/19/2013  FINDINGS: Small bilateral pleural effusions are seen, new from the prior study, with bibasilar airspace opacities. Underlying vascular congestion is seen. This raises concern for mild interstitial edema. No pneumothorax is seen.  The cardiomediastinal silhouette is mildly enlarged. A left-sided dual lumen catheter is noted ending about the cavoatrial junction. No acute osseous abnormalities are seen.  IMPRESSION: Small bilateral pleural effusions with bibasilar airspace opacities. Underlying vascular congestion and mild cardiomegaly. Findings concerning for mild interstitial edema.   Electronically Signed   By: Roanna Raider M.D.   On: 10/26/2013 06:11    EKG Interpretation   None       MDM  No diagnosis found.  Date: 10/26/2013  Rate: 112  Rhythm: sinus tachycardia  QRS Axis: normal  Intervals: normal  ST/T Wave abnormalities: nonspecific ST changes  Conduction Disutrbances:none  Narrative Interpretation: old anteroseptal infarct   Old EKG Reviewed: none available      Janiaya Ryser K Kyle Stansell-Rasch, MD 10/26/13 [phone]

## 2013-10-26 NOTE — ED Notes (Signed)
Per EMS, pt having increased difficulty breathing x2 weeks. Pt. Took home breathing treatment and when fire arrived patient oxygen saturation in the 70s. Pt. Given albuterol and atrovent treatment by EMS and saturation came up to 90s. Pt. Placed on normal 2L on arrival to ED and oxygen saturation dropped to 74%. Pt. Placed on non-breather at 10L and oxygen saturation came up to 90%.

## 2013-10-27 ENCOUNTER — Inpatient Hospital Stay (HOSPITAL_COMMUNITY): Payer: Medicare Other

## 2013-10-27 DIAGNOSIS — D649 Anemia, unspecified: Secondary | ICD-10-CM

## 2013-10-27 DIAGNOSIS — R748 Abnormal levels of other serum enzymes: Secondary | ICD-10-CM

## 2013-10-27 DIAGNOSIS — J441 Chronic obstructive pulmonary disease with (acute) exacerbation: Secondary | ICD-10-CM

## 2013-10-27 LAB — CBC
HCT: 31 % — ABNORMAL LOW (ref 36.0–46.0)
Hemoglobin: 9.6 g/dL — ABNORMAL LOW (ref 12.0–15.0)
MCH: 29.7 pg (ref 26.0–34.0)
MCHC: 31 g/dL (ref 30.0–36.0)
MCV: 96 fL (ref 78.0–100.0)
Platelets: 321 K/uL (ref 150–400)
RBC: 3.23 MIL/uL — ABNORMAL LOW (ref 3.87–5.11)
RDW: 15.6 % — ABNORMAL HIGH (ref 11.5–15.5)
WBC: 22 K/uL — ABNORMAL HIGH (ref 4.0–10.5)

## 2013-10-27 LAB — BASIC METABOLIC PANEL WITH GFR
BUN: 32 mg/dL — ABNORMAL HIGH (ref 6–23)
CO2: 27 meq/L (ref 19–32)
Calcium: 8.3 mg/dL — ABNORMAL LOW (ref 8.4–10.5)
Chloride: 89 meq/L — ABNORMAL LOW (ref 96–112)
Creatinine, Ser: 2.8 mg/dL — ABNORMAL HIGH (ref 0.50–1.10)
GFR calc Af Amer: 19 mL/min — ABNORMAL LOW
GFR calc non Af Amer: 16 mL/min — ABNORMAL LOW
Glucose, Bld: 480 mg/dL — ABNORMAL HIGH (ref 70–99)
Potassium: 4.1 meq/L (ref 3.5–5.1)
Sodium: 130 meq/L — ABNORMAL LOW (ref 135–145)

## 2013-10-27 LAB — GLUCOSE, CAPILLARY
Glucose-Capillary: 456 mg/dL — ABNORMAL HIGH (ref 70–99)
Glucose-Capillary: 332 mg/dL — ABNORMAL HIGH (ref 70–99)
Glucose-Capillary: 455 mg/dL — ABNORMAL HIGH (ref 70–99)
Glucose-Capillary: 280 mg/dL — ABNORMAL HIGH (ref 70–99)

## 2013-10-27 LAB — HEMOGLOBIN A1C
Hgb A1c MFr Bld: 8 % — ABNORMAL HIGH (ref <5.7)
Mean Plasma Glucose: 183 mg/dL — ABNORMAL HIGH (ref <117)

## 2013-10-27 LAB — TSH: TSH: 1.278 u[IU]/mL (ref 0.350–4.500)

## 2013-10-27 LAB — STREP PNEUMONIAE URINARY ANTIGEN: Strep Pneumo Urinary Antigen: NEGATIVE

## 2013-10-27 MED ORDER — AMLODIPINE BESYLATE 5 MG PO TABS
5.0000 mg | ORAL_TABLET | Freq: Every day | ORAL | Status: DC
  Administered 2013-10-27 – 2013-10-30 (×4): 5 mg via ORAL
  Filled 2013-10-27 (×6): qty 1

## 2013-10-27 MED ORDER — INSULIN ASPART 100 UNIT/ML ~~LOC~~ SOLN
0.0000 [IU] | Freq: Every day | SUBCUTANEOUS | Status: DC
  Administered 2013-10-27: 3 [IU] via SUBCUTANEOUS
  Administered 2013-10-28: 4 [IU] via SUBCUTANEOUS
  Administered 2013-10-29 – 2013-10-30 (×2): 3 [IU] via SUBCUTANEOUS

## 2013-10-27 MED ORDER — DOXYCYCLINE HYCLATE 100 MG PO TABS
100.0000 mg | ORAL_TABLET | Freq: Two times a day (BID) | ORAL | Status: DC
  Administered 2013-10-27 – 2013-10-31 (×9): 100 mg via ORAL
  Filled 2013-10-27 (×11): qty 1

## 2013-10-27 MED ORDER — INSULIN ASPART 100 UNIT/ML ~~LOC~~ SOLN
22.0000 [IU] | Freq: Once | SUBCUTANEOUS | Status: AC
  Administered 2013-10-27: 22 [IU] via SUBCUTANEOUS

## 2013-10-27 MED ORDER — INSULIN ASPART 100 UNIT/ML ~~LOC~~ SOLN
15.0000 [IU] | Freq: Once | SUBCUTANEOUS | Status: AC
  Administered 2013-10-27: 15 [IU] via SUBCUTANEOUS

## 2013-10-27 MED ORDER — METHYLPREDNISOLONE SODIUM SUCC 125 MG IJ SOLR
60.0000 mg | Freq: Every day | INTRAMUSCULAR | Status: DC
  Administered 2013-10-28 – 2013-10-29 (×2): 60 mg via INTRAVENOUS
  Filled 2013-10-27 (×2): qty 0.96

## 2013-10-27 MED ORDER — INSULIN ASPART 100 UNIT/ML ~~LOC~~ SOLN
0.0000 [IU] | Freq: Three times a day (TID) | SUBCUTANEOUS | Status: DC
  Administered 2013-10-27: 15 [IU] via SUBCUTANEOUS
  Administered 2013-10-28: 11 [IU] via SUBCUTANEOUS
  Administered 2013-10-28: 7 [IU] via SUBCUTANEOUS
  Administered 2013-10-29 (×2): 11 [IU] via SUBCUTANEOUS
  Administered 2013-10-29 – 2013-10-30 (×2): 4 [IU] via SUBCUTANEOUS
  Administered 2013-10-30: 11 [IU] via SUBCUTANEOUS
  Administered 2013-10-31: 7 [IU] via SUBCUTANEOUS
  Administered 2013-10-31: 15 [IU] via SUBCUTANEOUS

## 2013-10-27 MED ORDER — INSULIN GLARGINE 100 UNIT/ML ~~LOC~~ SOLN
25.0000 [IU] | Freq: Every day | SUBCUTANEOUS | Status: DC
  Administered 2013-10-27 – 2013-10-30 (×4): 25 [IU] via SUBCUTANEOUS
  Filled 2013-10-27 (×5): qty 0.25

## 2013-10-27 NOTE — Progress Notes (Addendum)
[facility] PROGRESS NOTE  [name] [name] ZOX:[id number] DOB: [birth year] DOA: 10/26/2013 PCP: Quentin Mulling, MD  Assessment/Plan: 1-Acute-on-chronic respiratory failure: Multifactorial, secondary to acute on chronic diastolic heart failure, bronchitis/COPD exacerbation.  -Patient with oxygen saturation in the mid 70s on arrival and requiring BiPAP to stabilize her breathing status.  -now after HD and COPD treatment breathing more comfortable and with good O2 sat on 4L oxygen. patient use 3L chronically -Will stop IV antibiotics as repeated CXR suggesting no PNA and will use PO abx's along with COPD therapy for bronchitis. -continue nebulizer treatments and solumedrol -start flutter valve -will transfer to telemetry -continue volume manageemnt with HD -apreciate renal service assistance -Continue supportive care.  -Patient is a full code and has expressed that she will like to be intubated if necessary   2-Hypothyroidism: TSH WNL; continue Synthroid at current dose.   3-HTN (hypertension): stable. Will continue current medication regimen   4-chronic diastolic CHF (congestive heart failure): With acute exacerbation. Treatment as mentioned above. Last 2-D echo done in July 2014 demonstrating ejection fraction 60-65% and no wall motion abnormalities -strict intake and output -daily weight -needs lower dry weight   5-ESRD on dialysis: Per renal service.   6-Diabetes: A1C 8.0 and due to steroids is running high -continue lantus, SSI and meal coverage -will adjust hypoglycemic regimen as needed  7-Elevated troponins: Most likely demand ischemia.  -Patient denies chest pain  -no acute ischemic changes on EKG  -Patient will be transfer to telemetry  -follow troponin -Continue statins, aspirin, b- blocker and nitrates   8-secondary hyperparathyroidism and anemia of chronic disease: As per renal recommendations.  9-HLD: continue statins  10-GERD: continue PPI  DVT:  heparin  Code Status: Full Family Communication: no family at bedside Disposition Plan: home when medically stable    Consultants:  Renal service   Procedures:  See x-ray reports below   Antibiotics:  vanc cefepime initially (received 2 days)  Doxycycline 12/25  HPI/Subjective:  Feeling a lot much better and breathing more comfortable. Afebrile. No CP  Objective: Filed Vitals:   10/27/13 1218  BP:   Pulse:   Temp: 97.7 F (36.5 C)  Resp:     Intake/Output Summary (Last 24 hours) at 10/27/13 1301 Last data filed at 10/27/13 0944  Gross per 24 hour  Intake    360 ml  Output   2724 ml  Net  -2364 ml   Filed Weights   10/26/13 1510  Weight: 67.6 kg (149 lb 0.5 oz)    Exam:   General:  Feeling a lot much better and breathing more comfortable. Afebrile. No CP  Cardiovascular: S1 and S2, no rubs or gallops, positive SEM. No JVD  Respiratory: diminished breath sounds bilaterally and rales bibasilar. Fine wheezing on expiration  Abdomen: soft, NT, ND, positive BS  Musculoskeletal: 1+ edema bilaterally  Data Reviewed: Basic Metabolic Panel:  Recent Labs Lab 10/26/13 0515 10/26/13 1048 10/27/13 0544 10/27/13 0845  NA 137 134* 130*  --   K 3.2* 4.0 4.1  --   CL 96 93* 89*  --   CO2  --  29 27  --   GLUCOSE 198* 407* 480* 432*  BUN 17 20 32*  --   CREATININE 3.00* 2.99* 2.80*  --   CALCIUM  --  8.3* 8.3*  --   PHOS  --  3.3  --   --    Liver Function Tests:  Recent Labs Lab 10/26/13 1048  AST 17  ALT 15  ALKPHOS 114  BILITOT 0.3  PROT 6.5  ALBUMIN 3.2*   CBC:  Recent Labs Lab 10/26/13 0510 10/26/13 0515 10/26/13 1048 10/27/13 0544  WBC 16.4*  --  12.9* 22.0*  NEUTROABS 13.4*  --   --   --   HGB 10.0* 10.9* 8.7* 9.6*  HCT 32.2* 32.0* 27.8* 31.0*  MCV 96.7  --  95.2 96.0  PLT 316  --  295 321   BNP (last 3 results)  Recent Labs  10/26/13 0510  PROBNP 9552.0*   CBG:  Recent Labs Lab 10/26/13 0926 10/26/13 1728  10/26/13 2138 10/27/13 0731 10/27/13 1116  GLUCAP 328* 247* 441* 456* 332*    Recent Results (from the past 240 hour(s))  MRSA PCR SCREENING     Status: None   Collection Time    10/26/13  3:11 PM      Result Value Range Status   MRSA by PCR NEGATIVE  NEGATIVE Final   Comment:            The GeneXpert MRSA Assay (FDA     approved for NASAL specimens     only), is one component of a     comprehensive MRSA colonization     surveillance program. It is not     intended to diagnose MRSA     infection nor to guide or     monitor treatment for     MRSA infections.     Studies: X-ray Chest Pa And Lateral   10/27/2013   CLINICAL DATA:  Short of breath.  COPD.  EXAM: CHEST  2 VIEW  COMPARISON:  10/26/2013  FINDINGS: Lung base opacity on the right has mildly improved. There is persistent hazy left lung base opacity mostly obscuring the hemidiaphragm. Lung base opacities most likely combination of small effusions and atelectasis.  The lungs are hyperexpanded. The cardiac silhouette is normal in size and configuration. The mediastinum is normal in contour.  Two lumen left internal jugular central venous catheter is stable.  IMPRESSION: 1. Mild improvement in lung aeration at the right base. There is still basilar lung opacity, greater on the left, likely combination of small effusions and atelectasis. No convincing pulmonary edema. 2. Lung hyperexpansion is consistent with COPD.   Electronically Signed   By: Amie Portland M.D.   On: 10/27/2013 08:14   Dg Chest 2 View  10/26/2013   CLINICAL DATA:  Shortness of breath; patient on dialysis.  EXAM: CHEST  2 VIEW  COMPARISON:  Chest radiograph performed 10/19/2013  FINDINGS: Small bilateral pleural effusions are seen, new from the prior study, with bibasilar airspace opacities. Underlying vascular congestion is seen. This raises concern for mild interstitial edema. No pneumothorax is seen.  The cardiomediastinal silhouette is mildly enlarged. A  left-sided dual lumen catheter is noted ending about the cavoatrial junction. No acute osseous abnormalities are seen.  IMPRESSION: Small bilateral pleural effusions with bibasilar airspace opacities. Underlying vascular congestion and mild cardiomegaly. Findings concerning for mild interstitial edema.   Electronically Signed   By: Roanna Raider M.D.   On: 10/26/2013 06:11    Scheduled Meds: . albuterol  2.5 mg Nebulization TID  . amLODipine  5 mg Oral QHS  . aspirin EC  81 mg Oral Daily  . budesonide (PULMICORT) nebulizer solution  0.25 mg Nebulization BID  . calcium acetate  667 mg Oral TID WC  . [START ON 10/28/2013] darbepoetin (ARANESP) injection - DIALYSIS  40 mcg Intravenous Q Fri-HD  . doxercalciferol  2  mcg Intravenous Q M,W,F-HD  . doxycycline  100 mg Oral Q12H  . [START ON 10/28/2013] ferric gluconate (FERRLECIT/NULECIT) IV  125 mg Intravenous Q M,W,F-HD  . heparin subcutaneous  5,000 Units Subcutaneous Q8H  . insulin aspart  0-20 Units Subcutaneous TID WC  . insulin aspart  0-5 Units Subcutaneous QHS  . insulin glargine  25 Units Subcutaneous QHS  . ipratropium  0.5 mg Nebulization TID  . isosorbide mononitrate  120 mg Oral Daily  . levothyroxine  25 mcg Oral QAC breakfast  . methylPREDNISolone (SOLU-MEDROL) injection  60 mg Intravenous Q12H  . metoprolol tartrate  12.5 mg Oral Daily  . multivitamin  1 tablet Oral QHS  . pantoprazole  40 mg Oral Daily  . simvastatin  10 mg Oral QPM  . sodium chloride  3 mL Intravenous Q12H   Continuous Infusions:      Time spent: >30 minutes   Malonie Tatum  [facility] Pager [phone]. If 7PM-7AM, please contact night-coverage at [email or web address], password [facility] 10/27/2013, 1:01 PM  LOS: 1 day

## 2013-10-27 NOTE — Progress Notes (Signed)
Transfer note:  Arrival Method: tx from 3S16 Mental Orientation: A&Ox4 Telemetry: N/A Assessment: See docflowsheets Skin: L heel wound IV: SL RAC Pain: No pain Tubes: N/A  6E Orientation: Patient has been oriented to the unit, staff and to the room.  Family: At the bedside

## 2013-10-27 NOTE — Progress Notes (Signed)
CRITICAL VALUE ALERT  Critical value received:  Troponin 1.03  Date of notification: 10/27/13  Time of notification: 17:56  Critical value read back:yes  Nurse who received alert: De Nurse, RN  MD notified (1st page): Gwenlyn Perking, MD  Time of first page: 19:02  MD notified (2nd page): N/A  Time of second page:

## 2013-10-27 NOTE — Progress Notes (Signed)
Subjective: Interval History: has no complaint, breathing much better, Thankful  Objective: Vital signs in last 24 hours: Temp:  [97.5 F (36.4 C)-98.5 F (36.9 C)] 97.5 F (36.4 C) (12/25 0404) Pulse Rate:  [72-112] 98 (12/25 0404) Resp:  [17-26] 26 (12/25 0404) BP: (94-177)/(54-93) 123/70 mmHg (12/25 0404) SpO2:  [91 %-100 %] 98 % (12/25 0404) FiO2 (%):  [50 %] 50 % (12/25 0404) Weight:  [67.6 kg (149 lb 0.5 oz)] 67.6 kg (149 lb 0.5 oz) (12/24 1510) Weight change:   Intake/Output from previous day: 12/24 0701 - 12/25 0700 In: -  Out: 3024 [Urine:500] Intake/Output this shift:    General appearance: alert, cooperative, mildly obese and pale Neck: no adenopathy, no carotid bruit, no JVD, supple, symmetrical, trachea midline, thyroid not enlarged, symmetric, no tenderness/mass/nodules and LIJ cath Resp: diminished breath sounds bilaterally and rales bibasilar Cardio: S1, S2 normal and systolic murmur: systolic ejection 2/6, decrescendo at 2nd left intercostal space GI: pos bs, obese, liver down 5 cm Extremities: edema 1+  Lab Results:  Recent Labs  10/26/13 1048 10/27/13 0544  WBC 12.9* 22.0*  HGB 8.7* 9.6*  HCT 27.8* 31.0*  PLT 295 321   BMET:  Recent Labs  10/26/13 1048 10/27/13 0544  NA 134* 130*  K 4.0 4.1  CL 93* 89*  CO2 29 27  GLUCOSE 407* 480*  BUN 20 32*  CREATININE 2.99* 2.80*  CALCIUM 8.3* 8.3*   No results found for this basename: PTH,  in the last 72 hours Iron Studies: No results found for this basename: IRON, TIBC, TRANSFERRIN, FERRITIN,  in the last 72 hours  Studies/Results: Dg Chest 2 View  10/26/2013   CLINICAL DATA:  Shortness of breath; patient on dialysis.  EXAM: CHEST  2 VIEW  COMPARISON:  Chest radiograph performed 10/19/2013  FINDINGS: Small bilateral pleural effusions are seen, new from the prior study, with bibasilar airspace opacities. Underlying vascular congestion is seen. This raises concern for mild interstitial edema. No  pneumothorax is seen.  The cardiomediastinal silhouette is mildly enlarged. A left-sided dual lumen catheter is noted ending about the cavoatrial junction. No acute osseous abnormalities are seen.  IMPRESSION: Small bilateral pleural effusions with bibasilar airspace opacities. Underlying vascular congestion and mild cardiomegaly. Findings concerning for mild interstitial edema.   Electronically Signed   By: Roanna Raider M.D.   On: 10/26/2013 06:11    I have reviewed the patient's current medications.  Assessment/Plan: 1 ESRD did well yest.  Will do HD in am.  Lower about 2 more liters. Need longer HD, lower UFR 2 Anemia epo/fe 3 PVD severe, limit vol off with cramping 4 HPTH 5 COPD 6 DM P HD, epo, ?d/c AB    LOS: 1 day   Daleena Rotter L 10/27/2013,8:14 AM

## 2013-10-28 ENCOUNTER — Inpatient Hospital Stay (HOSPITAL_COMMUNITY): Payer: Medicare Other

## 2013-10-28 LAB — GLUCOSE, CAPILLARY
Glucose-Capillary: 220 mg/dL — ABNORMAL HIGH (ref 70–99)
Glucose-Capillary: 329 mg/dL — ABNORMAL HIGH (ref 70–99)

## 2013-10-28 LAB — RENAL FUNCTION PANEL
Calcium: 8.3 mg/dL — ABNORMAL LOW (ref 8.4–10.5)
Creatinine, Ser: 4.55 mg/dL — ABNORMAL HIGH (ref 0.50–1.10)
GFR calc Af Amer: 10 mL/min — ABNORMAL LOW (ref >90)
GFR calc non Af Amer: 9 mL/min — ABNORMAL LOW (ref >90)
Phosphorus: 4.3 mg/dL (ref 2.3–4.6)
Sodium: 125 mEq/L — ABNORMAL LOW (ref 135–145)

## 2013-10-28 LAB — BLOOD GAS, ARTERIAL
Drawn by: 362771
O2 Content: 4 L/min
Patient temperature: 98.6
TCO2: 24.6 mmol/L (ref 0–100)
pH, Arterial: 7.323 — ABNORMAL LOW (ref 7.350–7.450)
pO2, Arterial: 50.2 mmHg — ABNORMAL LOW (ref 80.0–100.0)

## 2013-10-28 LAB — CBC
MCH: 30.5 pg (ref 26.0–34.0)
MCHC: 32.4 g/dL (ref 30.0–36.0)
Platelets: 346 10*3/uL (ref 150–400)
RBC: 3.02 MIL/uL — ABNORMAL LOW (ref 3.87–5.11)

## 2013-10-28 MED ORDER — HEPARIN SODIUM (PORCINE) 1000 UNIT/ML DIALYSIS: 1000.0000 [IU] | INTRAMUSCULAR | Status: DC | PRN

## 2013-10-28 MED ORDER — DOXERCALCIFEROL 4 MCG/2ML IV SOLN
INTRAVENOUS | Status: AC
  Filled 2013-10-28: qty 2

## 2013-10-28 MED ORDER — PENTAFLUOROPROP-TETRAFLUOROETH EX AERO: 1.0000 "application " | INHALATION_SPRAY | CUTANEOUS | Status: DC | PRN

## 2013-10-28 MED ORDER — DARBEPOETIN ALFA-POLYSORBATE 40 MCG/0.4ML IJ SOLN
INTRAMUSCULAR | Status: AC
  Filled 2013-10-28: qty 0.4

## 2013-10-28 MED ORDER — HEPARIN SODIUM (PORCINE) 1000 UNIT/ML DIALYSIS
100.0000 [IU]/kg | INTRAMUSCULAR | Status: DC | PRN
  Administered 2013-10-28: 6800 [IU] via INTRAVENOUS_CENTRAL

## 2013-10-28 MED ORDER — SODIUM CHLORIDE 0.9 % IV SOLN: 100.0000 mL | INTRAVENOUS | Status: DC | PRN

## 2013-10-28 MED ORDER — FUROSEMIDE 10 MG/ML IJ SOLN
80.0000 mg | Freq: Once | INTRAMUSCULAR | Status: AC
  Administered 2013-10-28: 80 mg via INTRAVENOUS
  Filled 2013-10-28: qty 8

## 2013-10-28 MED ORDER — LIDOCAINE HCL (PF) 1 % IJ SOLN: 5.0000 mL | INTRAMUSCULAR | Status: DC | PRN

## 2013-10-28 MED ORDER — NEPRO/CARBSTEADY PO LIQD
237.0000 mL | ORAL | Status: DC | PRN
  Filled 2013-10-28: qty 237

## 2013-10-28 MED ORDER — LIDOCAINE-PRILOCAINE 2.5-2.5 % EX CREA
1.0000 "application " | TOPICAL_CREAM | CUTANEOUS | Status: DC | PRN
  Filled 2013-10-28: qty 5

## 2013-10-28 MED ORDER — ALTEPLASE 2 MG IJ SOLR
2.0000 mg | Freq: Once | INTRAMUSCULAR | Status: DC | PRN
  Filled 2013-10-28: qty 2

## 2013-10-28 NOTE — Progress Notes (Signed)
Patient's O2 sat 80-83% on 4l/[place].Patient is using accessory muscle,RR 24.Dr. Sharyon Medicus notified,order received.Will continue to monitor. Terelle Dobler Joselita,RN

## 2013-10-28 NOTE — Progress Notes (Signed)
[facility] Progress Note  Subjective:  Short of breath on 5L 02   Objective Filed Vitals:   10/28/13 0531 10/28/13 0627 10/28/13 0726 10/28/13 0850  BP:      Pulse:      Temp:    97.5 F (36.4 C)  TempSrc:    Oral  Resp: 24     Height:      Weight:    69.2 kg (152 lb 8.9 oz)  SpO2: 83% 89% 92% 85%   Physical Exam General:Fatigued, alert, cooperative, NAD Heart: RRR Lungs: Decreased at bases. Faint wheezes. Some work of breathing Abdomen: Soft, NT, + BS Extremities: Trace LE edema Dialysis Access: L [facility]  Dialysis Orders: Center: [facility] on MWF .  F 160 EDW 68 kg HD Bath 2K/2.5Ca Time 3:30 Heparin 0. Access L TDC BFR 400 DFR 800  Hectorol 0 mcg IV/HD Epogen 5000 Units IV/HD Venofer 100 mg IV/HD/ 3 doses remaining, then 50 mg weekly  Recent labs: Hgb 9.0, Tsat 19%, P 4.1, PTH 334   Assessment/Plan: 1. Acute on chronic respiratory failure - Per admit. Multifactorial in setting of diastolic HF and COPD exacerbation. Off IV abx. Supportive care. 2. ESRD - MWF, HD today. K 4.1 3. Anemia - Hgb 9.6. Aranesp 40 q Fri HD. Tsat 19% Fe load in progress op with 3 doses remaining 4. Secondary hyperparathyroidism - Ca and Phos controlled. Hectorol 2 mcg started this admit for PTH 334. Continue phoslo 1 ac 5. HTN/volume - SBPs 140s-160s. Home BP meds decreased this admit to Norvasc 5 mg QD and Lopressor 12.5 BID. Net UF 2.5L on 12/24. Effusions on CXR about the same. HD today 3L. Needs lower edw at d/c. 6. Nutrition - Renal diet, multivitamin 7. CAD/PVD/ Hx MI - home meds 8. Hypothyroidism - on synthroid 9. DM - on insulin  Scot Jun. Thad Ranger [facility] Pager [phone] 10/28/2013,8:59 AM  LOS: 2 days    Additional Objective Labs: Basic Metabolic Panel:  Recent Labs Lab 10/26/13 0515 10/26/13 1048 10/27/13 0544 10/27/13 0845  NA 137 134* 130*  --   K 3.2* 4.0 4.1  --   CL 96 93* 89*  --   CO2  --  29 27  --   GLUCOSE 198* 407* 480* 432*   BUN 17 20 32*  --   CREATININE 3.00* 2.99* 2.80*  --   CALCIUM  --  8.3* 8.3*  --   PHOS  --  3.3  --   --    Liver Function Tests:  Recent Labs Lab 10/26/13 1048  AST 17  ALT 15  ALKPHOS 114  BILITOT 0.3  PROT 6.5  ALBUMIN 3.2*   CBC:  Recent Labs Lab 10/26/13 0510 10/26/13 0515 10/26/13 1048 10/27/13 0544  WBC 16.4*  --  12.9* 22.0*  NEUTROABS 13.4*  --   --   --   HGB 10.0* 10.9* 8.7* 9.6*  HCT 32.2* 32.0* 27.8* 31.0*  MCV 96.7  --  95.2 96.0  PLT 316  --  295 321   Blood Culture    Component Value Date/Time   SDES URINE, CLEAN CATCH 03/09/2013 0320   SPECREQUEST NONE 03/09/2013 0320   CULT Multiple bacterial morphotypes present, none predominant. Suggest appropriate recollection if clinically indicated. 03/09/2013 0320   REPTSTATUS 03/10/2013 FINAL 03/09/2013 0320    Cardiac Enzymes:  Recent Labs Lab 10/27/13 1656  TROPONINI 1.03*   CBG:  Recent Labs Lab 10/27/13 0731 10/27/13 1116 10/27/13 1646 10/27/13 2110  10/28/13 0746  GLUCAP 456* 332* 455* 280* 220*   Studies/Results: X-ray Chest Pa And Lateral   10/27/2013   CLINICAL DATA:  Short of breath.  COPD.  EXAM: CHEST  2 VIEW  COMPARISON:  10/26/2013  FINDINGS: Lung base opacity on the right has mildly improved. There is persistent hazy left lung base opacity mostly obscuring the hemidiaphragm. Lung base opacities most likely combination of small effusions and atelectasis.  The lungs are hyperexpanded. The cardiac silhouette is normal in size and configuration. The mediastinum is normal in contour.  Two lumen left internal jugular central venous catheter is stable.  IMPRESSION: 1. Mild improvement in lung aeration at the right base. There is still basilar lung opacity, greater on the left, likely combination of small effusions and atelectasis. No convincing pulmonary edema. 2. Lung hyperexpansion is consistent with COPD.   Electronically Signed   By: Amie Portland M.D.   On: 10/27/2013 08:14   Dg Chest  Port 1 View  10/28/2013   CLINICAL DATA:  Shortness of breath.  EXAM: PORTABLE CHEST - 1 VIEW  COMPARISON:  Chest radiograph October 27, 2013  FINDINGS: Cardiac silhouette is unremarkable, slightly increased central pulmonary vasculature congestion. Mild interstitial prominence with bibasilar airspace opacities, similar. Suspected trace pleural effusions. No pneumothorax.  Dialysis catheter via left internal jugular central venous approach with distal tip projecting cavoatrial junction. Soft tissue planes and included osseous structures are unremarkable.  IMPRESSION: Mildly worsening central pulmonary vasculature congestion. Mild bibasilar airspace opacities are similar. Suspected small pleural effusions.  No apparent change in dialysis catheter.   Electronically Signed   By: Awilda Metro   On: 10/28/2013 06:21   Medications:   . albuterol  2.5 mg Nebulization TID  . amLODipine  5 mg Oral QHS  . aspirin EC  81 mg Oral Daily  . budesonide (PULMICORT) nebulizer solution  0.25 mg Nebulization BID  . calcium acetate  667 mg Oral TID WC  . darbepoetin (ARANESP) injection - DIALYSIS  40 mcg Intravenous Q Fri-HD  . doxercalciferol  2 mcg Intravenous Q M,W,F-HD  . doxycycline  100 mg Oral Q12H  . ferric gluconate (FERRLECIT/NULECIT) IV  125 mg Intravenous Q M,W,F-HD  . heparin subcutaneous  5,000 Units Subcutaneous Q8H  . insulin aspart  0-20 Units Subcutaneous TID WC  . insulin aspart  0-5 Units Subcutaneous QHS  . insulin glargine  25 Units Subcutaneous QHS  . ipratropium  0.5 mg Nebulization TID  . isosorbide mononitrate  120 mg Oral Daily  . levothyroxine  25 mcg Oral QAC breakfast  . methylPREDNISolone (SOLU-MEDROL) injection  60 mg Intravenous Daily  . metoprolol tartrate  12.5 mg Oral Daily  . multivitamin  1 tablet Oral QHS  . pantoprazole  40 mg Oral Daily  . simvastatin  10 mg Oral QPM  . sodium chloride  3 mL Intravenous Q12H

## 2013-10-28 NOTE — Progress Notes (Signed)
[facility] PROGRESS NOTE  [name] [name] AVW:[id number] DOB: 08/15/1941 DOA: 10/26/2013 PCP: Quentin Mulling, MD  Assessment/Plan: 1-Acute-on-chronic respiratory failure: Multifactorial, secondary to acute on chronic diastolic heart failure, bronchitis/COPD exacerbation.  -Patient with oxygen saturation in the high 80s today.Marland Kitchen  -CXR with vascular congestion -patient use 3L chronically -Will continue solumedrol, continue doxycycline and nebulizer treatments -continue flutter valve -will monitor on telemetry -continue volume manageemnt with HD -apreciate renal service assistance -Continue supportive care.  -Patient is a full code and has expressed that she will like to be intubated if necessary   2-Hypothyroidism: TSH WNL; continue Synthroid at current dose.   3-HTN (hypertension): stable. Will continue current medication regimen   4-chronic diastolic CHF (congestive heart failure): With acute exacerbation. Treatment as mentioned above. Last 2-D echo done in July 2014 demonstrating ejection fraction 60-65% and no wall motion abnormalities -strict intake and output -daily weight -needs lower dry weight   5-ESRD on dialysis: Per renal service.   6-Diabetes: A1C 8.0 and due to steroids is running high -continue lantus, SSI and meal coverage -will adjust hypoglycemic regimen as needed  7-Elevated troponins: Most likely demand ischemia.  -Patient cont denying any chest pain  -no acute ischemic changes on EKG or telemetry -Continue statins, aspirin, b- blocker and nitrates   8-secondary hyperparathyroidism and anemia of chronic disease: As per renal recommendations.  9-HLD: continue statins  10-GERD: continue PPI  DVT: heparin  Code Status: Full Family Communication: no family at bedside Disposition Plan: home when medically stable    Consultants:  Renal service   Procedures:  See x-ray reports below   Antibiotics:  vanc cefepime initially (received 2  days)  Doxycycline 12/25  HPI/Subjective: Patient reports SOB, auscultation demonstrated bibasilar crackles  Objective: Filed Vitals:   10/28/13 2050  BP: 105/55  Pulse: 85  Temp: 97.7 F (36.5 C)  Resp: 18    Intake/Output Summary (Last 24 hours) at 10/28/13 2245 Last data filed at 10/28/13 2055  Gross per 24 hour  Intake    720 ml  Output   2786 ml  Net  -2066 ml   Filed Weights   10/28/13 0850 10/28/13 1255 10/28/13 2050  Weight: 69.2 kg (152 lb 8.9 oz) 66.5 kg (146 lb 9.7 oz) 66.5 kg (146 lb 9.7 oz)    Exam:   General:  Feeling SOB today (12/26), Afebrile. No CP  Cardiovascular: S1 and S2, no rubs or gallops, positive SEM. No JVD  Respiratory: diminished breath sounds bilaterally and rales bibasilar. Positive  wheezing on expiration  Abdomen: soft, NT, ND, positive BS  Musculoskeletal: 1+ edema bilaterally  Data Reviewed: Basic Metabolic Panel:  Recent Labs Lab 10/26/13 0515 10/26/13 1048 10/27/13 0544 10/27/13 0845 10/28/13 0857  NA 137 134* 130*  --  125*  K 3.2* 4.0 4.1  --  4.1  CL 96 93* 89*  --  85*  CO2  --  29 27  --  24  GLUCOSE 198* 407* 480* 432* 219*  BUN 17 20 32*  --  69*  CREATININE 3.00* 2.99* 2.80*  --  4.55*  CALCIUM  --  8.3* 8.3*  --  8.3*  PHOS  --  3.3  --   --  4.3   Liver Function Tests:  Recent Labs Lab 10/26/13 1048 10/28/13 0857  AST 17  --   ALT 15  --   ALKPHOS 114  --   BILITOT 0.3  --   PROT 6.5  --   ALBUMIN 3.2*  3.3*   CBC:  Recent Labs Lab 10/26/13 0510 10/26/13 0515 10/26/13 1048 10/27/13 0544 10/28/13 0857  WBC 16.4*  --  12.9* 22.0* 27.0*  NEUTROABS 13.4*  --   --   --   --   HGB 10.0* 10.9* 8.7* 9.6* 9.2*  HCT 32.2* 32.0* 27.8* 31.0* 28.4*  MCV 96.7  --  95.2 96.0 94.0  PLT 316  --  295 321 346   BNP (last 3 results)  Recent Labs  10/26/13 0510  PROBNP 9552.0*   CBG:  Recent Labs Lab 10/27/13 2110 10/28/13 0746 10/28/13 1341 10/28/13 1634 10/28/13 2053  GLUCAP 280* 220*  109* 262* 329*    Recent Results (from the past 240 hour(s))  MRSA PCR SCREENING     Status: None   Collection Time    10/26/13  3:11 PM      Result Value Range Status   MRSA by PCR NEGATIVE  NEGATIVE Final   Comment:            The GeneXpert MRSA Assay (FDA     approved for NASAL specimens     only), is one component of a     comprehensive MRSA colonization     surveillance program. It is not     intended to diagnose MRSA     infection nor to guide or     monitor treatment for     MRSA infections.  CULTURE, BLOOD (ROUTINE X 2)     Status: None   Collection Time    10/26/13  7:45 PM      Result Value Range Status   Specimen Description BLOOD RIGHT ARM   Final   Special Requests BOTTLES DRAWN AEROBIC ONLY 5CC   Final   Culture  Setup Time     Final   Value: 10/27/2013 02:15     Performed at [facility]   Culture     Final   Value:        BLOOD CULTURE RECEIVED NO GROWTH TO DATE CULTURE WILL BE HELD FOR 5 DAYS BEFORE ISSUING A FINAL NEGATIVE REPORT     Performed at [facility]   Report Status PENDING   Incomplete  CULTURE, BLOOD (ROUTINE X 2)     Status: None   Collection Time    10/26/13  7:55 PM      Result Value Range Status   Specimen Description BLOOD RIGHT HAND   Final   Special Requests BOTTLES DRAWN AEROBIC ONLY 5CC   Final   Culture  Setup Time     Final   Value: 10/27/2013 02:16     Performed at [facility]   Culture     Final   Value:        BLOOD CULTURE RECEIVED NO GROWTH TO DATE CULTURE WILL BE HELD FOR 5 DAYS BEFORE ISSUING A FINAL NEGATIVE REPORT     Performed at [facility]   Report Status PENDING   Incomplete     Studies: X-ray Chest Pa And Lateral   10/27/2013   CLINICAL DATA:  Short of breath.  COPD.  EXAM: CHEST  2 VIEW  COMPARISON:  10/26/2013  FINDINGS: Lung base opacity on the right has mildly improved. There is persistent hazy left lung base opacity mostly obscuring the hemidiaphragm. Lung base opacities most  likely combination of small effusions and atelectasis.  The lungs are hyperexpanded. The cardiac silhouette is normal in size and configuration. The mediastinum is normal  in contour.  Two lumen left internal jugular central venous catheter is stable.  IMPRESSION: 1. Mild improvement in lung aeration at the right base. There is still basilar lung opacity, greater on the left, likely combination of small effusions and atelectasis. No convincing pulmonary edema. 2. Lung hyperexpansion is consistent with COPD.   Electronically Signed   By: Amie Portland M.D.   On: 10/27/2013 08:14   Dg Chest Port 1 View  10/28/2013   CLINICAL DATA:  Shortness of breath.  EXAM: PORTABLE CHEST - 1 VIEW  COMPARISON:  Chest radiograph October 27, 2013  FINDINGS: Cardiac silhouette is unremarkable, slightly increased central pulmonary vasculature congestion. Mild interstitial prominence with bibasilar airspace opacities, similar. Suspected trace pleural effusions. No pneumothorax.  Dialysis catheter via left internal jugular central venous approach with distal tip projecting cavoatrial junction. Soft tissue planes and included osseous structures are unremarkable.  IMPRESSION: Mildly worsening central pulmonary vasculature congestion. Mild bibasilar airspace opacities are similar. Suspected small pleural effusions.  No apparent change in dialysis catheter.   Electronically Signed   By: Awilda Metro   On: 10/28/2013 06:21    Scheduled Meds: . albuterol  2.5 mg Nebulization TID  . amLODipine  5 mg Oral QHS  . aspirin EC  81 mg Oral Daily  . budesonide (PULMICORT) nebulizer solution  0.25 mg Nebulization BID  . calcium acetate  667 mg Oral TID WC  . darbepoetin      . darbepoetin (ARANESP) injection - DIALYSIS  40 mcg Intravenous Q Fri-HD  . doxercalciferol  2 mcg Intravenous Q M,W,F-HD  . doxycycline  100 mg Oral Q12H  . ferric gluconate (FERRLECIT/NULECIT) IV  125 mg Intravenous Q M,W,F-HD  . heparin subcutaneous  5,000  Units Subcutaneous Q8H  . insulin aspart  0-20 Units Subcutaneous TID WC  . insulin aspart  0-5 Units Subcutaneous QHS  . insulin glargine  25 Units Subcutaneous QHS  . ipratropium  0.5 mg Nebulization TID  . isosorbide mononitrate  120 mg Oral Daily  . levothyroxine  25 mcg Oral QAC breakfast  . methylPREDNISolone (SOLU-MEDROL) injection  60 mg Intravenous Daily  . metoprolol tartrate  12.5 mg Oral Daily  . multivitamin  1 tablet Oral QHS  . pantoprazole  40 mg Oral Daily  . simvastatin  10 mg Oral QPM  . sodium chloride  3 mL Intravenous Q12H   Continuous Infusions:      Time spent: >30 minutes   Rashaan Wyles  [facility] Pager [phone]. If 7PM-7AM, please contact night-coverage at [email or web address], password [facility] 10/28/2013, 10:45 PM  LOS: 2 days

## 2013-10-28 NOTE — Consult Note (Signed)
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 Patient has been followed once monthly at outpatient wound care center (at [facility]) and has been discharge from [facility] services due to size and chronicity of wound. Wound type:pressure Pressure Ulcer POA: Yes Measurement:1cm x 0.5cm x 0.4cm Wound ZOX:WRUEA pink, non-granulating with thin layer of yellow slough that removes easily with cleansing. Drainage (amount, consistency, odor) Small amount of light yellow exudate, no odor Periwound:intact, macerated Dressing procedure/placement/frequency:I will begin a twice daily saline dressing to this wound in hopes that we can remove the thin layer of slough and keep it off. WOC nursing team will not routinely follow, but will remain available to this patient, the nursing and medical teams.  Please re-consult if needed. Thanks, Ladona Mow, MSN, RN, GNP, [facility], [facility] [phone])

## 2013-10-28 NOTE — Progress Notes (Signed)
Patient was seen on dialysis and the procedure was supervised. BFR 400 [address] [facility] BP is 160/80.  Patient appears to be tolerating treatment well  I have seen and examined this patient and agree with plan as outlined by K. Aldona Lento A,MD 10/28/2013 9:38 AM

## 2013-10-28 NOTE — Progress Notes (Signed)
Patient's O2 sat after neb treatment 88-89% on 4l/[place].Dr.Hijazi made aware,MD also aware of ABG and CXR result.Order received. Andjela Wickes Joselita,RN

## 2013-10-28 NOTE — Progress Notes (Signed)
Pt's O2 sats have remained borderline post dialysis (upper 80's, low 90's on 4L O2 [place]), and there does not seem to be a large difference in her O2 sat readings between nasal canula, and venti mask. (93% O2 sat registered with both 4L [place], and 35% Venti Mask, with the pulse ox probe on her forehead). Her O2 sats were checked on fingers, toes and forehead, and forehead seems to give the highest reading of 93%. Pt is tachypneic, but her breathing still seems greatly improved from first thing this am prior to dialysis. Pt prefers to wear the nasal canula, as it is more comfortable for her. I will recommend that the night RN check her O2 sats using the probe on her forehead. MD made aware.

## 2013-10-28 NOTE — Progress Notes (Signed)
10/28/2013 1540 UR completed. Isidoro Donning RN CCM Case Mgmt phone [phone]

## 2013-10-29 DIAGNOSIS — I2510 Atherosclerotic heart disease of native coronary artery without angina pectoris: Secondary | ICD-10-CM

## 2013-10-29 LAB — GLUCOSE, CAPILLARY
Glucose-Capillary: 181 mg/dL — ABNORMAL HIGH (ref 70–99)
Glucose-Capillary: 251 mg/dL — ABNORMAL HIGH (ref 70–99)
Glucose-Capillary: 253 mg/dL — ABNORMAL HIGH (ref 70–99)

## 2013-10-29 MED ORDER — SODIUM CHLORIDE 0.9 % IV SOLN
125.0000 mg | INTRAVENOUS | Status: DC
  Administered 2013-10-30: 125 mg via INTRAVENOUS
  Filled 2013-10-29: qty 10

## 2013-10-29 MED ORDER — PREDNISONE 10 MG PO TABS
60.0000 mg | ORAL_TABLET | Freq: Every day | ORAL | Status: DC
  Administered 2013-10-30 – 2013-10-31 (×2): 60 mg via ORAL
  Filled 2013-10-29 (×3): qty 1

## 2013-10-29 NOTE — Progress Notes (Signed)
[facility] PROGRESS NOTE  [name] [name] ZOX:[id number] DOB: May 26, 1941 DOA: 10/26/2013 PCP: Quentin Mulling, MD  Assessment/Plan: 1-Acute-on-chronic respiratory failure: Multifactorial, secondary to acute on chronic diastolic heart failure, bronchitis/COPD exacerbation.  -Patient with oxygen saturation in the mid 90's today; but with resp distress last night requiring venturi mask(12/27)  -CXR with vascular congestion -patient use 3L-4L chronically -Will switch to prednisone in am, continue doxycycline and nebulizer treatments -start Is -will monitor on telemetry -continue volume manageemnt with HD -apreciate renal service assistance -Continue supportive care.  -Patient is a full code and has expressed that she will like to be intubated if necessary  -plan is to go home after HD tomorrow if stable (maximize filtration anticipated during HD)  2-Hypothyroidism: TSH WNL; continue Synthroid at current dose.   3-HTN (hypertension): stable. Will continue current medication regimen   4-chronic diastolic CHF (congestive heart failure): With acute exacerbation. Treatment as mentioned above. Last 2-D echo done in July 2014 demonstrating ejection fraction 60-65% and no wall motion abnormalities -strict intake and output -daily weight -needs lower dry weight -still with crackles on physical exam   5-ESRD on dialysis: Per renal service.   6-Diabetes: A1C 8.0  -continue lantus, SSI and meal coverage -will adjust hypoglycemic regimen as needed -stable/fair control currently -will anticipate improvement now that steroids tapering is going to start  7-Elevated troponins: Most likely demand ischemia.  -Patient cont denying any chest pain  -no acute ischemic changes on EKG or telemetry -Continue statins, aspirin, b- blocker and nitrates   8-secondary hyperparathyroidism and anemia of chronic disease: As per renal recommendations.  9-HLD: continue statins  10-GERD: continue  PPI  11-Left heel pressure ulcer: follow WOC rec's. Place prevaloon boots   DVT: heparin  Code Status: Full Family Communication: no family at bedside Disposition Plan: home when medically stable    Consultants:  Renal service   WOC  Procedures:  See x-ray reports below   Antibiotics:  vanc cefepime initially (received 2 days)  Doxycycline 12/25 (plan is for total of 8 days)  HPI/Subjective: Patient with significant SOB overnight requiring venturi mask; CXR demonstrating vascular congestion. Patient feeling better today (12/27) and is less SOB. Still with mild wheezing and crackles on exam.    Objective: Filed Vitals:   10/29/13 1329  BP: 119/85  Pulse: 86  Temp: 97.7 F (36.5 C)  Resp:     Intake/Output Summary (Last 24 hours) at 10/29/13 1620 Last data filed at 10/29/13 1330  Gross per 24 hour  Intake    840 ml  Output    200 ml  Net    640 ml   Filed Weights   10/28/13 0850 10/28/13 1255 10/28/13 2050  Weight: 69.2 kg (152 lb 8.9 oz) 66.5 kg (146 lb 9.7 oz) 66.5 kg (146 lb 9.7 oz)    Exam:   General:  Feeling better today and in no acute distress. Afebrile. No CP  Cardiovascular: S1 and S2, no rubs or gallops, positive SEM. No JVD  Respiratory: bibasilar diminished breath sounds and positive crckles bibasilar. Mild wheezing on expiration  Abdomen: soft, NT, ND, positive BS  Musculoskeletal: trace edema bilaterally; Left heel with pressure ulcer, no significant drainage; dressing in place.  Data Reviewed: Basic Metabolic Panel:  Recent Labs Lab 10/26/13 0515 10/26/13 1048 10/27/13 0544 10/27/13 0845 10/28/13 0857  NA 137 134* 130*  --  125*  K 3.2* 4.0 4.1  --  4.1  CL 96 93* 89*  --  85*  CO2  --  29 27  --  24  GLUCOSE 198* 407* 480* 432* 219*  BUN 17 20 32*  --  69*  CREATININE 3.00* 2.99* 2.80*  --  4.55*  CALCIUM  --  8.3* 8.3*  --  8.3*  PHOS  --  3.3  --   --  4.3   Liver Function Tests:  Recent Labs Lab 10/26/13 1048  10/28/13 0857  AST 17  --   ALT 15  --   ALKPHOS 114  --   BILITOT 0.3  --   PROT 6.5  --   ALBUMIN 3.2* 3.3*   CBC:  Recent Labs Lab 10/26/13 0510 10/26/13 0515 10/26/13 1048 10/27/13 0544 10/28/13 0857  WBC 16.4*  --  12.9* 22.0* 27.0*  NEUTROABS 13.4*  --   --   --   --   HGB 10.0* 10.9* 8.7* 9.6* 9.2*  HCT 32.2* 32.0* 27.8* 31.0* 28.4*  MCV 96.7  --  95.2 96.0 94.0  PLT 316  --  295 321 346   BNP (last 3 results)  Recent Labs  10/26/13 0510  PROBNP 9552.0*   CBG:  Recent Labs Lab 10/28/13 1341 10/28/13 1634 10/28/13 2053 10/29/13 0744 10/29/13 1138  GLUCAP 109* 262* 329* 181* 251*    Recent Results (from the past 240 hour(s))  MRSA PCR SCREENING     Status: None   Collection Time    10/26/13  3:11 PM      Result Value Range Status   MRSA by PCR NEGATIVE  NEGATIVE Final   Comment:            The GeneXpert MRSA Assay (FDA     approved for NASAL specimens     only), is one component of a     comprehensive MRSA colonization     surveillance program. It is not     intended to diagnose MRSA     infection nor to guide or     monitor treatment for     MRSA infections.  CULTURE, BLOOD (ROUTINE X 2)     Status: None   Collection Time    10/26/13  7:45 PM      Result Value Range Status   Specimen Description BLOOD RIGHT ARM   Final   Special Requests BOTTLES DRAWN AEROBIC ONLY 5CC   Final   Culture  Setup Time     Final   Value: 10/27/2013 02:15     Performed at [facility]   Culture     Final   Value:        BLOOD CULTURE RECEIVED NO GROWTH TO DATE CULTURE WILL BE HELD FOR 5 DAYS BEFORE ISSUING A FINAL NEGATIVE REPORT     Performed at [facility]   Report Status PENDING   Incomplete  CULTURE, BLOOD (ROUTINE X 2)     Status: None   Collection Time    10/26/13  7:55 PM      Result Value Range Status   Specimen Description BLOOD RIGHT HAND   Final   Special Requests BOTTLES DRAWN AEROBIC ONLY 5CC   Final   Culture  Setup Time      Final   Value: 10/27/2013 02:16     Performed at [facility]   Culture     Final   Value:        BLOOD CULTURE RECEIVED NO GROWTH TO DATE CULTURE WILL BE HELD FOR 5 DAYS BEFORE ISSUING A FINAL NEGATIVE REPORT  Performed at [facility]   Report Status PENDING   Incomplete     Studies: Dg Chest Port 1 View  10/28/2013   CLINICAL DATA:  Shortness of breath.  EXAM: PORTABLE CHEST - 1 VIEW  COMPARISON:  Chest radiograph October 27, 2013  FINDINGS: Cardiac silhouette is unremarkable, slightly increased central pulmonary vasculature congestion. Mild interstitial prominence with bibasilar airspace opacities, similar. Suspected trace pleural effusions. No pneumothorax.  Dialysis catheter via left internal jugular central venous approach with distal tip projecting cavoatrial junction. Soft tissue planes and included osseous structures are unremarkable.  IMPRESSION: Mildly worsening central pulmonary vasculature congestion. Mild bibasilar airspace opacities are similar. Suspected small pleural effusions.  No apparent change in dialysis catheter.   Electronically Signed   By: Awilda Metro   On: 10/28/2013 06:21    Scheduled Meds: . albuterol  2.5 mg Nebulization TID  . amLODipine  5 mg Oral QHS  . aspirin EC  81 mg Oral Daily  . budesonide (PULMICORT) nebulizer solution  0.25 mg Nebulization BID  . calcium acetate  667 mg Oral TID WC  . darbepoetin (ARANESP) injection - DIALYSIS  40 mcg Intravenous Q Fri-HD  . doxercalciferol  2 mcg Intravenous Q M,W,F-HD  . doxycycline  100 mg Oral Q12H  . [START ON 10/30/2013] ferric gluconate (FERRLECIT/NULECIT) IV  125 mg Intravenous Custom  . heparin subcutaneous  5,000 Units Subcutaneous Q8H  . insulin aspart  0-20 Units Subcutaneous TID WC  . insulin aspart  0-5 Units Subcutaneous QHS  . insulin glargine  25 Units Subcutaneous QHS  . ipratropium  0.5 mg Nebulization TID  . isosorbide mononitrate  120 mg Oral Daily  . levothyroxine   25 mcg Oral QAC breakfast  . metoprolol tartrate  12.5 mg Oral Daily  . multivitamin  1 tablet Oral QHS  . pantoprazole  40 mg Oral Daily  . [START ON 10/30/2013] predniSONE  60 mg Oral Q breakfast  . simvastatin  10 mg Oral QPM  . sodium chloride  3 mL Intravenous Q12H   Continuous Infusions:      Time spent: >30 minutes   Mehr Depaoli  [facility] Pager [phone]. If 7PM-7AM, please contact night-coverage at [email or web address], password [facility] 10/29/2013, 4:20 PM  LOS: 3 days

## 2013-10-29 NOTE — Progress Notes (Signed)
I have seen and examined this patient and agree with plan as outlined by K. Broadus John, PA-C.  Possible discharge to home today per primary svc.  outpt HD will be arranged for tomorrow if she does go or here if she stays. Nabria Nevin A,MD 10/29/2013 10:40 AM

## 2013-10-29 NOTE — Progress Notes (Signed)
[facility] Progress Note  Subjective:   Feeling much better, breathing improved but still on 5L.  Objective Filed Vitals:   10/28/13 1837 10/28/13 2050 10/28/13 2317 10/29/13 0432  BP:  105/55  116/56  Pulse:  85  85  Temp:  97.7 F (36.5 C)  97.8 F (36.6 C)  TempSrc:  Oral  Oral  Resp:  18  18  Height:      Weight:  66.5 kg (146 lb 9.7 oz)    SpO2: 93% 93% 94% 98%   Physical Exam General: Alert, cooperative, NAD Heart: RRR Lungs: Bilateral crackles, faint wheezes R>L. Diminished but breathing is unlabored on 5L 02 Abdomen: Soft, NT, +BS Extremities: No pretibial edema, soft boot on LLE Dialysis Access: L [facility]  Dialysis Orders: Center: [facility] on MWF .  F 160 EDW 68 kg HD Bath 2K/2.5Ca Time 3:30 Heparin 0. Access L TDC BFR 400 DFR 800  Hectorol 0 mcg IV/HD Epogen 5000 Units IV/HD Venofer 100 mg IV/HD/ 3 doses remaining, then 50 mg weekly  Recent labs: Hgb 9.0, Tsat 19%, P 4.1, PTH 334   Assessment/Plan: 1. Acute on chronic respiratory failure - Per admit. Multifactorial in setting of diastolic HF and COPD exacerbation. Off IV abx. Supportive care. 2. ESRD - MWF, HD tomorrow per holiday schedule K 4.1 3. Anemia - Hgb 9.2< 9.6. Aranesp 40 q Fri HD. Tsat 19% Fe load in progress per op schedule (2 doses remaining) 4. Secondary hyperparathyroidism - Ca and Phos controlled. Hectorol 2 mcg started this admit for PTH 334. Continue phoslo 1 ac 5. HTN/volume - SBPs 110s. Home BP meds decreased this admit to Norvasc 5 mg QD and Lopressor 12.5 BID. Net UF ~2.8L on 12/26. Still with crackles on PE. Needs lower edw at d/c. Try for UF 3L 6. Nutrition - Renal diet, multivitamin 7. CAD/PVD/ Hx MI - home meds 8. Hypothyroidism - on synthroid 9. DM - on insulin  Scot Jun. Thad Ranger [facility] Pager [phone] 10/29/2013,9:16 AM  LOS: 3 days    Additional Objective Labs: Basic Metabolic Panel:  Recent Labs Lab 10/26/13 1048 10/27/13 0544  10/27/13 0845 10/28/13 0857  NA 134* 130*  --  125*  K 4.0 4.1  --  4.1  CL 93* 89*  --  85*  CO2 29 27  --  24  GLUCOSE 407* 480* 432* 219*  BUN 20 32*  --  69*  CREATININE 2.99* 2.80*  --  4.55*  CALCIUM 8.3* 8.3*  --  8.3*  PHOS 3.3  --   --  4.3   Liver Function Tests:  Recent Labs Lab 10/26/13 1048 10/28/13 0857  AST 17  --   ALT 15  --   ALKPHOS 114  --   BILITOT 0.3  --   PROT 6.5  --   ALBUMIN 3.2* 3.3*   No results found for this basename: LIPASE, AMYLASE,  in the last 168 hours CBC:  Recent Labs Lab 10/26/13 0510  10/26/13 1048 10/27/13 0544 10/28/13 0857  WBC 16.4*  --  12.9* 22.0* 27.0*  NEUTROABS 13.4*  --   --   --   --   HGB 10.0*  < > 8.7* 9.6* 9.2*  HCT 32.2*  < > 27.8* 31.0* 28.4*  MCV 96.7  --  95.2 96.0 94.0  PLT 316  --  295 321 346  < > = values in this interval not displayed. Blood Culture    Component Value Date/Time  SDES BLOOD RIGHT HAND 10/26/2013 1955   SPECREQUEST BOTTLES DRAWN AEROBIC ONLY 5CC 10/26/2013 1955   CULT  Value:        BLOOD CULTURE RECEIVED NO GROWTH TO DATE CULTURE WILL BE HELD FOR 5 DAYS BEFORE ISSUING A FINAL NEGATIVE REPORT Performed at [facility] 10/26/2013 1955   REPTSTATUS PENDING 10/26/2013 1955    Cardiac Enzymes:  Recent Labs Lab 10/27/13 1656  TROPONINI 1.03*   CBG:  Recent Labs Lab 10/28/13 0746 10/28/13 1341 10/28/13 1634 10/28/13 2053 10/29/13 0744  GLUCAP 220* 109* 262* 329* 181*    Studies/Results: Dg Chest Port 1 View  10/28/2013   CLINICAL DATA:  Shortness of breath.  EXAM: PORTABLE CHEST - 1 VIEW  COMPARISON:  Chest radiograph October 27, 2013  FINDINGS: Cardiac silhouette is unremarkable, slightly increased central pulmonary vasculature congestion. Mild interstitial prominence with bibasilar airspace opacities, similar. Suspected trace pleural effusions. No pneumothorax.  Dialysis catheter via left internal jugular central venous approach with distal tip projecting  cavoatrial junction. Soft tissue planes and included osseous structures are unremarkable.  IMPRESSION: Mildly worsening central pulmonary vasculature congestion. Mild bibasilar airspace opacities are similar. Suspected small pleural effusions.  No apparent change in dialysis catheter.   Electronically Signed   By: Awilda Metro   On: 10/28/2013 06:21   Medications:   . albuterol  2.5 mg Nebulization TID  . amLODipine  5 mg Oral QHS  . aspirin EC  81 mg Oral Daily  . budesonide (PULMICORT) nebulizer solution  0.25 mg Nebulization BID  . calcium acetate  667 mg Oral TID WC  . darbepoetin (ARANESP) injection - DIALYSIS  40 mcg Intravenous Q Fri-HD  . doxercalciferol  2 mcg Intravenous Q M,W,F-HD  . doxycycline  100 mg Oral Q12H  . ferric gluconate (FERRLECIT/NULECIT) IV  125 mg Intravenous Q M,W,F-HD  . heparin subcutaneous  5,000 Units Subcutaneous Q8H  . insulin aspart  0-20 Units Subcutaneous TID WC  . insulin aspart  0-5 Units Subcutaneous QHS  . insulin glargine  25 Units Subcutaneous QHS  . ipratropium  0.5 mg Nebulization TID  . isosorbide mononitrate  120 mg Oral Daily  . levothyroxine  25 mcg Oral QAC breakfast  . methylPREDNISolone (SOLU-MEDROL) injection  60 mg Intravenous Daily  . metoprolol tartrate  12.5 mg Oral Daily  . multivitamin  1 tablet Oral QHS  . pantoprazole  40 mg Oral Daily  . simvastatin  10 mg Oral QPM  . sodium chloride  3 mL Intravenous Q12H

## 2013-10-30 DIAGNOSIS — J9600 Acute respiratory failure, unspecified whether with hypoxia or hypercapnia: Secondary | ICD-10-CM

## 2013-10-30 DIAGNOSIS — I509 Heart failure, unspecified: Secondary | ICD-10-CM

## 2013-10-30 LAB — RENAL FUNCTION PANEL
BUN: 67 mg/dL — ABNORMAL HIGH (ref 6–23)
CO2: 24 mEq/L (ref 19–32)
Chloride: 85 mEq/L — ABNORMAL LOW (ref 96–112)
GFR calc Af Amer: 11 mL/min — ABNORMAL LOW (ref >90)
Glucose, Bld: 287 mg/dL — ABNORMAL HIGH (ref 70–99)
Phosphorus: 6.5 mg/dL — ABNORMAL HIGH (ref 2.3–4.6)
Potassium: 4.9 mEq/L (ref 3.5–5.1)
Sodium: 123 mEq/L — ABNORMAL LOW (ref 135–145)

## 2013-10-30 LAB — CBC
HCT: 30.3 % — ABNORMAL LOW (ref 36.0–46.0)
Hemoglobin: 10 g/dL — ABNORMAL LOW (ref 12.0–15.0)
MCHC: 33 g/dL (ref 30.0–36.0)
Platelets: 388 10*3/uL (ref 150–400)
RBC: 3.27 MIL/uL — ABNORMAL LOW (ref 3.87–5.11)
WBC: 30.4 10*3/uL — ABNORMAL HIGH (ref 4.0–10.5)

## 2013-10-30 LAB — GLUCOSE, CAPILLARY
Glucose-Capillary: 160 mg/dL — ABNORMAL HIGH (ref 70–99)
Glucose-Capillary: 256 mg/dL — ABNORMAL HIGH (ref 70–99)

## 2013-10-30 NOTE — Progress Notes (Addendum)
During rounds patient noted to have increasing shortness of breath. Checked o2  sats which showed 79%. Pt alert and talking stating "i just dont feel good."placed pt on 100% non-re breather and sats up to 97%. Lungs with rales and rhonci . Pt to go to dialysis first this am.

## 2013-10-30 NOTE — Procedures (Signed)
I was present at this dialysis session. I have reviewed the session itself and made appropriate changes.   Pt w/ desat o/n req NRB.  Goal UF 3L today, but a challenge given mild hypotension during Tx.  Critline suggest that not depleting intravascular volume.  Now on 3L [place].    Pt w/ Na 123. Chronically low.  Pt already with 1.2L fluid restriction.  Follow.    Sabra Heck  MD 10/30/2013, 8:43 AM

## 2013-10-31 DIAGNOSIS — I519 Heart disease, unspecified: Secondary | ICD-10-CM

## 2013-10-31 LAB — GLUCOSE, CAPILLARY: Glucose-Capillary: 114 mg/dL — ABNORMAL HIGH (ref 70–99)

## 2013-10-31 MED ORDER — DOXYCYCLINE HYCLATE 100 MG PO TABS: 100.0000 mg | ORAL_TABLET | Freq: Two times a day (BID) | ORAL | Status: DC

## 2013-10-31 MED ORDER — BUDESONIDE 0.25 MG/2ML IN SUSP: 0.2500 mg | Freq: Two times a day (BID) | RESPIRATORY_TRACT | Status: DC

## 2013-10-31 MED ORDER — PREDNISONE 20 MG PO TABS: 60.0000 mg | ORAL_TABLET | Freq: Every day | ORAL | Status: DC

## 2013-10-31 MED ORDER — ALBUTEROL SULFATE (5 MG/ML) 0.5% IN NEBU: 2.5000 mg | INHALATION_SOLUTION | Freq: Three times a day (TID) | RESPIRATORY_TRACT | Status: DC

## 2013-10-31 MED ORDER — ALBUTEROL SULFATE (2.5 MG/3ML) 0.083% IN NEBU: 2.5000 mg | INHALATION_SOLUTION | Freq: Four times a day (QID) | RESPIRATORY_TRACT | Status: DC | PRN

## 2013-10-31 MED ORDER — ALBUTEROL SULFATE (2.5 MG/3ML) 0.083% IN NEBU: 2.5000 mg | INHALATION_SOLUTION | Freq: Three times a day (TID) | RESPIRATORY_TRACT | Status: DC

## 2013-10-31 MED ORDER — BIOTENE DRY MOUTH MT LIQD
15.0000 mL | Freq: Two times a day (BID) | OROMUCOSAL | Status: DC
  Administered 2013-10-31: 15 mL via OROMUCOSAL

## 2013-10-31 MED ORDER — CALCIUM ACETATE 667 MG PO CAPS
1334.0000 mg | ORAL_CAPSULE | Freq: Three times a day (TID) | ORAL | Status: DC
  Administered 2013-10-31 (×2): 1334 mg via ORAL
  Filled 2013-10-31 (×3): qty 2

## 2013-10-31 NOTE — Progress Notes (Signed)
Discussed discharge instructions and medications with pt. Pt showed no barriers to discharge. IV removed. Dressing change to foot wound performed prior to discharge (pt to follow-up with wound center tomorrow for follow-up visit). Pt instructed to use 4L of O2 at discharge, until she has her follow-up appt with PCP. Pt waiting for son to pick her up and take her home (he will be bringing portable O2 tank for transport). Assessment unchanged from morning.

## 2013-10-31 NOTE — Progress Notes (Signed)
, Subjective:  No sob , tolerated hd yest. / relates being out of  Nebulizer meds at [facility] /Needs refill at [facility] Objective Vital signs in last 24 hours: Filed Vitals:   10/30/13 2100 10/30/13 2150 10/31/13 0543 10/31/13 0823  BP: 137/59  141/47   Pulse: 81 88 87   Temp: 98.1 F (36.7 C)  97.9 F (36.6 C)   TempSrc: Oral  Oral   Resp: 18 18 20    Height:      Weight:      SpO2: 96%  95% 92%   Weight change: 0.6 kg (1 lb 5.2 oz)  Intake/Output Summary (Last 24 hours) at 10/31/13 0924 Last data filed at 10/31/13 0612  Gross per 24 hour  Intake    360 ml  Output   1686 ml  Net  -1326 ml   Labs: Basic Metabolic Panel:  Recent Labs Lab 10/26/13 1048 10/27/13 0544 10/27/13 0845 10/28/13 0857 10/30/13 0653  NA 134* 130*  --  125* 123*  K 4.0 4.1  --  4.1 4.9  CL 93* 89*  --  85* 85*  CO2 29 27  --  24 24  GLUCOSE 407* 480* 432* 219* 287*  BUN 20 32*  --  69* 67*  CREATININE 2.99* 2.80*  --  4.55* 4.37*  CALCIUM 8.3* 8.3*  --  8.3* 7.9*  PHOS 3.3  --   --  4.3 6.5*   Liver Function Tests:  Recent Labs Lab 10/26/13 1048 10/28/13 0857 10/30/13 0653  AST 17  --   --   ALT 15  --   --   ALKPHOS 114  --   --   BILITOT 0.3  --   --   PROT 6.5  --   --   ALBUMIN 3.2* 3.3* 3.4*     Recent Labs Lab 10/26/13 0510  10/26/13 1048 10/27/13 0544 10/28/13 0857 10/30/13 0653  WBC 16.4*  --  12.9* 22.0* 27.0* 30.4*  NEUTROABS 13.4*  --   --   --   --   --   HGB 10.0*  < > 8.7* 9.6* 9.2* 10.0*  HCT 32.2*  < > 27.8* 31.0* 28.4* 30.3*  MCV 96.7  --  95.2 96.0 94.0 92.7  PLT 316  --  295 321 346 388  < > = values in this interval not displayed. Cardiac Enzymes:  Recent Labs Lab 10/27/13 1656  TROPONINI 1.03*   CBG:  Recent Labs Lab 10/29/13 2123 10/30/13 1107 10/30/13 1706 10/30/13 2141 10/31/13 0757  GLUCAP 281* 160* 256* 260* 114*   Medications:   . albuterol  2.5 mg Nebulization TID  . amLODipine  5 mg Oral QHS  . antiseptic oral rinse  15 mL Mouth  Rinse BID  . aspirin EC  81 mg Oral Daily  . budesonide (PULMICORT) nebulizer solution  0.25 mg Nebulization BID  . calcium acetate  667 mg Oral TID WC  . darbepoetin (ARANESP) injection - DIALYSIS  40 mcg Intravenous Q Fri-HD  . doxercalciferol  2 mcg Intravenous Q M,W,F-HD  . doxycycline  100 mg Oral Q12H  . ferric gluconate (FERRLECIT/NULECIT) IV  125 mg Intravenous Custom  . heparin subcutaneous  5,000 Units Subcutaneous Q8H  . insulin aspart  0-20 Units Subcutaneous TID WC  . insulin aspart  0-5 Units Subcutaneous QHS  . insulin glargine  25 Units Subcutaneous QHS  . ipratropium  0.5 mg Nebulization TID  . isosorbide mononitrate  120 mg Oral Daily  .  levothyroxine  25 mcg Oral QAC breakfast  . metoprolol tartrate  12.5 mg Oral Daily  . multivitamin  1 tablet Oral QHS  . pantoprazole  40 mg Oral Daily  . predniSONE  60 mg Oral Q breakfast  . simvastatin  10 mg Oral QPM  . sodium chloride  3 mL Intravenous Q12H  .  Physical Exam: General: Alert NAD  Heart: RRR Lungs: DEcreased BS at bases otherwise CTA Abdomen: bs +. Soft Nontender Extremities: Dialysis Access:L ft in boot not unwrapped/ no R pedal edema / L ij perm cath  Dialysis Orders: Center: [facility] on MWF .  F 160 EDW 68 kg HD Bath 2K/2.5Ca Time 3:30 Heparin 0. Access L TDC BFR 400 DFR 800  Hectorol 0 mcg IV/HD Epogen 5000 Units IV/HD Venofer 100 mg IV/HD/ 3 doses remaining, then 50 mg weekly  Recent labs: Hgb 9.0, Tsat 19%, P 4.1, PTH 334   Problem/Plan: 1. Acute on chronic respiratory failure   Multifactorial in setting of diastolic HF and COPD exacerbation.Needs Neb meds at DC/ PO Doxycycline per admit. Has home o2. 2. ESRD - MWF, HD yesterday per holiday 3. Anemia - Hgb10<9.2< 9.6. Aranesp 40 q Fri HD. Tsat 19% Fe load in progress per op schedule (1 doses remaining) 4. Secondary hyperparathyroidism - Ca controlled. and Phos= 6.5 on  Hectorol 2 mcg started this admit for PTH 334/ Increase  phoslo 2 ac 5. HTN/volume -BP  stable with  Home BP meds decreased this admit to Norvasc 5 mg QD and Lopressor 12.5 BID. Wt to 67.4 yesterday with hd and only 1800 uf Needs lower edw at d/c. Try for 67.0 AND LONGER HD TIME 4 hrs to help Prevent bp drops 6. Nutrition - Renal diet, multivitamin 7. CAD/PVD/ Hx MI - home meds 8. Hypothyroidism - on synthroid 9. DM - on insulin   Lenny Pastel, PA-C [facility] Beeper [phone] 10/31/2013,9:24 AM  LOS: 5 days   \I have seen and examined patient, discussed with PA and agree with assessment and plan as outlined above. [name] MD pager [phone]    cell [phone] 10/31/2013, 4:16 PM

## 2013-10-31 NOTE — Discharge Summary (Signed)
Physician Discharge Summary  [name] [name] UEA:[id number] DOB: [birth year] DOA: 10/26/2013  PCP: Quentin Mulling, MD  Admit date: 10/26/2013 Discharge date: 10/31/2013  Time spent: 35 minutes  Recommendations for Outpatient Follow-up:  1. Follow up with PCP in 1-2 weeks  Discharge Diagnoses:  Principal Problem:   Acute-on-chronic respiratory failure Active Problems:   Hypothyroidism   HTN (hypertension)   CHF (congestive heart failure)   ESRD on dialysis   Diabetes   Discharge Condition: Improved  Diet recommendation: Renal, diabeti  Filed Weights   10/30/13 0644 10/30/13 1016 10/30/13 2026  Weight: 69.1 kg (152 lb 5.4 oz) 67.4 kg (148 lb 9.4 oz) 67.5 kg (148 lb 13 oz)    History of present illness:  [name] [name] is a 72 y.o. female with a past medical history significant for diabetes, hypercholesterolemia, hypothyroidism, hypertension, end-stage renal disease on hemodialysis, chronic diastolic heart failure and chronic respiratory failure secondary to COPD (on chronic oxygen supplementation 3 L at home); came to ED secondary to increased shortness of breath, cough orthopnea and hypoxia. Patient reports that her symptoms has been present for the last 2 weeks and is steadily progressing and worsening, symptoms are unrelieved by home nebulizer treatments. At home when EMS arrived to pick her at patient was found with oxygen saturation in the 70s while using her 3 L of oxygen. Patient was transfer to a major department for further evaluation and treatment after receiving some albuterol and Atrovent; in the ED patient requires nonrebreather at 10 L for oxygen saturation in the 90s with subsequent requirement of BiPAP to maintain oxygen saturation. A chest x-ray demonstrated bilateral opacities with concerns for pneumonia and vascular congestion with CHF findings. Patient was actively wheezing and [facility] has been called to admit the patient for further evaluation and treatment.  Patient received IV antibiotics in the ED for healthcare associated pneumonia renal service was also contacted for hemodialysis.  Patient denies chest pain, fever/chills, abdominal pain, nausea/vomiting, hematemesis, melena, or any other acute complaints.  Hospital Course:  The patient was admitted to the inpatient service. She was initially requiring bipap support. The patient was started on steroids and scheduled neb treatments. She also underwent dialysis with marked improvement towards the end of her hospital stay. On the day of discharge, the patient was breathing at 3L [place] (baseline O2 requirement) with no wheezing on exam. She was otherwise medically stable with stable hyponatremia (well-known to nephrology service). The patient will be discharged home with rx for nebs and PO antibiotics to complete her course.  Consultations:  Nephrology  Discharge Exam: Filed Vitals:   10/30/13 2150 10/31/13 0543 10/31/13 0823 10/31/13 1000  BP:  141/47  150/76  Pulse: 88 87  86  Temp:  97.9 F (36.6 C)  98.2 F (36.8 C)  TempSrc:  Oral  Oral  Resp: 18 20  18   Height:      Weight:      SpO2:  95% 92% 94%    General: Awake, in nad Cardiovascular: regular, s1, s2 Respiratory: normal resp effort, no wheezing  Discharge Instructions       Future Appointments Provider Department Dept Phone   11/01/2013 10:15 AM [facility] [phone]       Medication List         albuterol (5 MG/ML) 0.5% nebulizer solution  Commonly known as:  PROVENTIL  Take 0.5 mLs (2.5 mg total) by nebulization 3 (three) times daily.  amLODipine 10 MG tablet  Commonly known as:  NORVASC  Take 10 mg by mouth daily.     aspirin EC 81 MG tablet  Take 81 mg by mouth daily.     budesonide 0.25 MG/2ML nebulizer solution  Commonly known as:  PULMICORT  Take 2 mLs (0.25 mg total) by nebulization 2 (two) times daily.     calcium acetate 667 MG capsule   Commonly known as:  PHOSLO  Take 667 mg by mouth 3 (three) times daily with meals.     doxycycline 100 MG tablet  Commonly known as:  VIBRA-TABS  Take 1 tablet (100 mg total) by mouth every 12 (twelve) hours.     insulin glargine 100 UNIT/ML injection  Commonly known as:  LANTUS  Inject 25 Units into the skin at bedtime.     isosorbide mononitrate 120 MG 24 hr tablet  Commonly known as:  IMDUR  Take 120 mg by mouth daily.     levothyroxine 25 MCG tablet  Commonly known as:  SYNTHROID, LEVOTHROID  Take 25 mcg by mouth daily.     metoprolol tartrate 25 MG tablet  Commonly known as:  LOPRESSOR  Take 12.5 mg by mouth daily.     nitroGLYCERIN 0.4 MG SL tablet  Commonly known as:  NITROSTAT  Place 0.4 mg under the tongue every 5 (five) minutes as needed. For chest pain.     NOVOLIN R RELION 100 units/mL injection  Generic drug:  insulin regular  Inject into the skin 3 (three) times daily. Sliding scale     pantoprazole 40 MG tablet  Commonly known as:  PROTONIX  Take 40 mg by mouth daily.     predniSONE 20 MG tablet  Commonly known as:  DELTASONE  Take 3 tablets (60 mg total) by mouth daily with breakfast.     PROBIOTIC PO  Take 1 capsule by mouth daily as needed (digestion).     simvastatin 10 MG tablet  Commonly known as:  ZOCOR  Take 10 mg by mouth every evening.       Allergies  Allergen Reactions  . Procaine Hcl Nausea And Vomiting and Other (See Comments)    "Pass out"   Follow-up Information   Follow up with HOOPER,[name], MD. Schedule an appointment as soon as possible for a visit in 2 weeks.   Specialty:  Geriatric Medicine   Contact information:   [address] [place] [address] [phone]        The results of significant diagnostics from this hospitalization (including imaging, microbiology, ancillary and laboratory) are listed below for reference.    Significant Diagnostic Studies: X-ray Chest Pa And Lateral   10/27/2013    CLINICAL DATA:  Short of breath.  COPD.  EXAM: CHEST  2 VIEW  COMPARISON:  10/26/2013  FINDINGS: Lung base opacity on the right has mildly improved. There is persistent hazy left lung base opacity mostly obscuring the hemidiaphragm. Lung base opacities most likely combination of small effusions and atelectasis.  The lungs are hyperexpanded. The cardiac silhouette is normal in size and configuration. The mediastinum is normal in contour.  Two lumen left internal jugular central venous catheter is stable.  IMPRESSION: 1. Mild improvement in lung aeration at the right base. There is still basilar lung opacity, greater on the left, likely combination of small effusions and atelectasis. No convincing pulmonary edema. 2. Lung hyperexpansion is consistent with COPD.   Electronically Signed   By: Renard Hamper.D.  On: 10/27/2013 08:14   Dg Chest 2 View  10/26/2013   CLINICAL DATA:  Shortness of breath; patient on dialysis.  EXAM: CHEST  2 VIEW  COMPARISON:  Chest radiograph performed 10/19/2013  FINDINGS: Small bilateral pleural effusions are seen, new from the prior study, with bibasilar airspace opacities. Underlying vascular congestion is seen. This raises concern for mild interstitial edema. No pneumothorax is seen.  The cardiomediastinal silhouette is mildly enlarged. A left-sided dual lumen catheter is noted ending about the cavoatrial junction. No acute osseous abnormalities are seen.  IMPRESSION: Small bilateral pleural effusions with bibasilar airspace opacities. Underlying vascular congestion and mild cardiomegaly. Findings concerning for mild interstitial edema.   Electronically Signed   By: Roanna Raider M.D.   On: 10/26/2013 06:11   Dg Chest Port 1 View  10/28/2013   CLINICAL DATA:  Shortness of breath.  EXAM: PORTABLE CHEST - 1 VIEW  COMPARISON:  Chest radiograph October 27, 2013  FINDINGS: Cardiac silhouette is unremarkable, slightly increased central pulmonary vasculature congestion. Mild  interstitial prominence with bibasilar airspace opacities, similar. Suspected trace pleural effusions. No pneumothorax.  Dialysis catheter via left internal jugular central venous approach with distal tip projecting cavoatrial junction. Soft tissue planes and included osseous structures are unremarkable.  IMPRESSION: Mildly worsening central pulmonary vasculature congestion. Mild bibasilar airspace opacities are similar. Suspected small pleural effusions.  No apparent change in dialysis catheter.   Electronically Signed   By: Awilda Metro   On: 10/28/2013 06:21   Dg Chest Portable 1 View  10/19/2013   CLINICAL DATA:  Weakness with shortness of breath.  EXAM: PORTABLE CHEST - 1 VIEW  COMPARISON:  07/26/2013  FINDINGS: Hyperinflated lungs with apical emphysematous change. There is interlobular septal thickening at the bases that is new. No effusion or pneumothorax. Stable heart size and mediastinal contours. Unchanged positioning of dialysis catheter via left IJ approach.  IMPRESSION: Mild pulmonary edema that is superimposed on COPD.   Electronically Signed   By: Tiburcio Pea M.D.   On: 10/19/2013 03:10    Microbiology: Recent Results (from the past 240 hour(s))  MRSA PCR SCREENING     Status: None   Collection Time    10/26/13  3:11 PM      Result Value Range Status   MRSA by PCR NEGATIVE  NEGATIVE Final   Comment:            The GeneXpert MRSA Assay (FDA     approved for NASAL specimens     only), is one component of a     comprehensive MRSA colonization     surveillance program. It is not     intended to diagnose MRSA     infection nor to guide or     monitor treatment for     MRSA infections.  CULTURE, BLOOD (ROUTINE X 2)     Status: None   Collection Time    10/26/13  7:45 PM      Result Value Range Status   Specimen Description BLOOD RIGHT ARM   Final   Special Requests BOTTLES DRAWN AEROBIC ONLY 5CC   Final   Culture  Setup Time     Final   Value: 10/27/2013 02:15      Performed at [facility]   Culture     Final   Value:        BLOOD CULTURE RECEIVED NO GROWTH TO DATE CULTURE WILL BE HELD FOR 5 DAYS BEFORE ISSUING A FINAL NEGATIVE REPORT  Performed at [facility]   Report Status PENDING   Incomplete  CULTURE, BLOOD (ROUTINE X 2)     Status: None   Collection Time    10/26/13  7:55 PM      Result Value Range Status   Specimen Description BLOOD RIGHT HAND   Final   Special Requests BOTTLES DRAWN AEROBIC ONLY 5CC   Final   Culture  Setup Time     Final   Value: 10/27/2013 02:16     Performed at [facility]   Culture     Final   Value:        BLOOD CULTURE RECEIVED NO GROWTH TO DATE CULTURE WILL BE HELD FOR 5 DAYS BEFORE ISSUING A FINAL NEGATIVE REPORT     Performed at [facility]   Report Status PENDING   Incomplete     Labs: Basic Metabolic Panel:  Recent Labs Lab 10/26/13 0515 10/26/13 1048 10/27/13 0544 10/27/13 0845 10/28/13 0857 10/30/13 0653  NA 137 134* 130*  --  125* 123*  K 3.2* 4.0 4.1  --  4.1 4.9  CL 96 93* 89*  --  85* 85*  CO2  --  29 27  --  24 24  GLUCOSE 198* 407* 480* 432* 219* 287*  BUN 17 20 32*  --  69* 67*  CREATININE 3.00* 2.99* 2.80*  --  4.55* 4.37*  CALCIUM  --  8.3* 8.3*  --  8.3* 7.9*  PHOS  --  3.3  --   --  4.3 6.5*   Liver Function Tests:  Recent Labs Lab 10/26/13 1048 10/28/13 0857 10/30/13 0653  AST 17  --   --   ALT 15  --   --   ALKPHOS 114  --   --   BILITOT 0.3  --   --   PROT 6.5  --   --   ALBUMIN 3.2* 3.3* 3.4*   No results found for this basename: LIPASE, AMYLASE,  in the last 168 hours No results found for this basename: AMMONIA,  in the last 168 hours CBC:  Recent Labs Lab 10/26/13 0510 10/26/13 0515 10/26/13 1048 10/27/13 0544 10/28/13 0857 10/30/13 0653  WBC 16.4*  --  12.9* 22.0* 27.0* 30.4*  NEUTROABS 13.4*  --   --   --   --   --   HGB 10.0* 10.9* 8.7* 9.6* 9.2* 10.0*  HCT 32.2* 32.0* 27.8* 31.0* 28.4* 30.3*  MCV 96.7  --  95.2  96.0 94.0 92.7  PLT 316  --  295 321 346 388   Cardiac Enzymes:  Recent Labs Lab 10/27/13 1656  TROPONINI 1.03*   BNP: BNP (last 3 results)  Recent Labs  10/26/13 0510  PROBNP 9552.0*   CBG:  Recent Labs Lab 10/30/13 1107 10/30/13 1706 10/30/13 2141 10/31/13 0757 10/31/13 1130  GLUCAP 160* 256* 260* 114* 231*       Signed:  Jayshaun Phillips K  [facility] 10/31/2013, 4:01 PM

## 2013-10-31 NOTE — Progress Notes (Signed)
Inpatient Diabetes Program Recommendations  AACE/ADA: New Consensus Statement on Inpatient Glycemic Control (2013)  Target Ranges:  Prepandial:   less than 140 mg/dL      Peak postprandial:   less than 180 mg/dL (1-2 hours)      Critically ill patients:  140 - 180 mg/dL   Reason for Assessment: Sub-optimal glycemic control  Results for [name] [name], [name] [name] (MRN [id number]) as of 10/31/2013 12:43  Ref. Range 10/30/2013 11:07 10/30/2013 17:06 10/30/2013 21:41 10/31/2013 07:57 10/31/2013 11:30  Glucose-Capillary Latest Range: 70-99 mg/dL 782 (H) 956 (H) 213 (H) 114 (H) 231 (H)   Note:  Patient receiving steroid therapy.  Eating 85 to 60%.  Request MD order for 3 units Novolog tid with meals as meal coverage in addition to correction scale and to be held if patient eats 50% or less or CBG less than 80 mg/dl.  Thank you.  Chirag Krueger S. Elsie Lincoln, RN, CNS, CDE Inpatient Diabetes Program, team pager [phone]

## 2013-11-01 ENCOUNTER — Encounter (HOSPITAL_BASED_OUTPATIENT_CLINIC_OR_DEPARTMENT_OTHER): Payer: Medicare Other | Attending: General Surgery

## 2013-11-02 LAB — CULTURE, BLOOD (ROUTINE X 2): Culture: NO GROWTH

## 2013-11-07 ENCOUNTER — Emergency Department (HOSPITAL_COMMUNITY): Payer: Medicare Other

## 2013-11-07 ENCOUNTER — Encounter (HOSPITAL_COMMUNITY): Payer: Self-pay | Admitting: Emergency Medicine

## 2013-11-07 ENCOUNTER — Inpatient Hospital Stay (HOSPITAL_COMMUNITY)
Admission: EM | Admit: 2013-11-07 | Discharge: 2013-11-13 | DRG: 291 | Disposition: A | Discharge status: Home / Self Care | Payer: Medicare Other | Attending: Family Medicine | Admitting: Family Medicine

## 2013-11-07 DIAGNOSIS — E119 Type 2 diabetes mellitus without complications: Secondary | ICD-10-CM | POA: Diagnosis present

## 2013-11-07 DIAGNOSIS — N2581 Secondary hyperparathyroidism of renal origin: Secondary | ICD-10-CM | POA: Diagnosis present

## 2013-11-07 DIAGNOSIS — I2510 Atherosclerotic heart disease of native coronary artery without angina pectoris: Secondary | ICD-10-CM

## 2013-11-07 DIAGNOSIS — Z8249 Family history of ischemic heart disease and other diseases of the circulatory system: Secondary | ICD-10-CM

## 2013-11-07 DIAGNOSIS — E7800 Pure hypercholesterolemia, unspecified: Secondary | ICD-10-CM | POA: Diagnosis present

## 2013-11-07 DIAGNOSIS — J90 Pleural effusion, not elsewhere classified: Secondary | ICD-10-CM

## 2013-11-07 DIAGNOSIS — I252 Old myocardial infarction: Secondary | ICD-10-CM

## 2013-11-07 DIAGNOSIS — Z833 Family history of diabetes mellitus: Secondary | ICD-10-CM

## 2013-11-07 DIAGNOSIS — Z9851 Tubal ligation status: Secondary | ICD-10-CM

## 2013-11-07 DIAGNOSIS — I120 Hypertensive chronic kidney disease with stage 5 chronic kidney disease or end stage renal disease: Secondary | ICD-10-CM | POA: Diagnosis present

## 2013-11-07 DIAGNOSIS — E785 Hyperlipidemia, unspecified: Secondary | ICD-10-CM | POA: Diagnosis present

## 2013-11-07 DIAGNOSIS — D631 Anemia in chronic kidney disease: Secondary | ICD-10-CM | POA: Diagnosis present

## 2013-11-07 DIAGNOSIS — J189 Pneumonia, unspecified organism: Secondary | ICD-10-CM | POA: Diagnosis present

## 2013-11-07 DIAGNOSIS — Z86718 Personal history of other venous thrombosis and embolism: Secondary | ICD-10-CM

## 2013-11-07 DIAGNOSIS — J9601 Acute respiratory failure with hypoxia: Secondary | ICD-10-CM | POA: Diagnosis present

## 2013-11-07 DIAGNOSIS — N186 End stage renal disease: Secondary | ICD-10-CM | POA: Diagnosis present

## 2013-11-07 DIAGNOSIS — J441 Chronic obstructive pulmonary disease with (acute) exacerbation: Secondary | ICD-10-CM | POA: Diagnosis present

## 2013-11-07 DIAGNOSIS — J9600 Acute respiratory failure, unspecified whether with hypoxia or hypercapnia: Secondary | ICD-10-CM

## 2013-11-07 DIAGNOSIS — I739 Peripheral vascular disease, unspecified: Secondary | ICD-10-CM | POA: Diagnosis present

## 2013-11-07 DIAGNOSIS — I509 Heart failure, unspecified: Secondary | ICD-10-CM | POA: Diagnosis present

## 2013-11-07 DIAGNOSIS — Z79899 Other long term (current) drug therapy: Secondary | ICD-10-CM

## 2013-11-07 DIAGNOSIS — Z87891 Personal history of nicotine dependence: Secondary | ICD-10-CM

## 2013-11-07 DIAGNOSIS — Z7982 Long term (current) use of aspirin: Secondary | ICD-10-CM

## 2013-11-07 DIAGNOSIS — E039 Hypothyroidism, unspecified: Secondary | ICD-10-CM | POA: Diagnosis present

## 2013-11-07 DIAGNOSIS — Z841 Family history of disorders of kidney and ureter: Secondary | ICD-10-CM

## 2013-11-07 DIAGNOSIS — N039 Chronic nephritic syndrome with unspecified morphologic changes: Secondary | ICD-10-CM

## 2013-11-07 DIAGNOSIS — Z794 Long term (current) use of insulin: Secondary | ICD-10-CM

## 2013-11-07 DIAGNOSIS — S98139A Complete traumatic amputation of one unspecified lesser toe, initial encounter: Secondary | ICD-10-CM

## 2013-11-07 DIAGNOSIS — J9620 Acute and chronic respiratory failure, unspecified whether with hypoxia or hypercapnia: Secondary | ICD-10-CM | POA: Diagnosis present

## 2013-11-07 DIAGNOSIS — I2582 Chronic total occlusion of coronary artery: Secondary | ICD-10-CM | POA: Diagnosis present

## 2013-11-07 DIAGNOSIS — Z86711 Personal history of pulmonary embolism: Secondary | ICD-10-CM

## 2013-11-07 DIAGNOSIS — E1129 Type 2 diabetes mellitus with other diabetic kidney complication: Secondary | ICD-10-CM | POA: Diagnosis present

## 2013-11-07 DIAGNOSIS — Z992 Dependence on renal dialysis: Secondary | ICD-10-CM

## 2013-11-07 DIAGNOSIS — K219 Gastro-esophageal reflux disease without esophagitis: Secondary | ICD-10-CM | POA: Diagnosis present

## 2013-11-07 DIAGNOSIS — I5033 Acute on chronic diastolic (congestive) heart failure: Principal | ICD-10-CM | POA: Diagnosis present

## 2013-11-07 LAB — CBC
HEMATOCRIT: 31.6 % — AB (ref 36.0–46.0)
Hemoglobin: 10.3 g/dL — ABNORMAL LOW (ref 12.0–15.0)
MCH: 31.8 pg (ref 26.0–34.0)
MCHC: 32.6 g/dL (ref 30.0–36.0)
MCV: 97.5 fL (ref 78.0–100.0)
Platelets: 250 10*3/uL (ref 150–400)
RBC: 3.24 MIL/uL — ABNORMAL LOW (ref 3.87–5.11)
RDW: 17.7 % — AB (ref 11.5–15.5)
WBC: 12.6 10*3/uL — ABNORMAL HIGH (ref 4.0–10.5)

## 2013-11-07 LAB — BASIC METABOLIC PANEL
BUN: 55 mg/dL — ABNORMAL HIGH (ref 6–23)
CO2: 24 meq/L (ref 19–32)
CREATININE: 3.54 mg/dL — AB (ref 0.50–1.10)
Calcium: 8.9 mg/dL (ref 8.4–10.5)
Chloride: 97 mEq/L (ref 96–112)
GFR calc Af Amer: 14 mL/min — ABNORMAL LOW (ref >90)
GFR calc non Af Amer: 12 mL/min — ABNORMAL LOW (ref >90)
Glucose, Bld: 239 mg/dL — ABNORMAL HIGH (ref 70–99)
Potassium: 4.5 mEq/L (ref 3.7–5.3)
SODIUM: 138 meq/L (ref 137–147)

## 2013-11-07 LAB — CBC WITH DIFFERENTIAL/PLATELET
Basophils Absolute: 0 10*3/uL (ref 0.0–0.1)
Basophils Relative: 0 % (ref 0–1)
Eosinophils Absolute: 0.1 10*3/uL (ref 0.0–0.7)
Eosinophils Relative: 1 % (ref 0–5)
HEMATOCRIT: 32 % — AB (ref 36.0–46.0)
Hemoglobin: 10 g/dL — ABNORMAL LOW (ref 12.0–15.0)
LYMPHS PCT: 9 % — AB (ref 12–46)
Lymphs Abs: 1.3 10*3/uL (ref 0.7–4.0)
MCH: 30.9 pg (ref 26.0–34.0)
MCHC: 31.3 g/dL (ref 30.0–36.0)
MCV: 98.8 fL (ref 78.0–100.0)
MONO ABS: 0.6 10*3/uL (ref 0.1–1.0)
Monocytes Relative: 4 % (ref 3–12)
NEUTROS ABS: 12.5 10*3/uL — AB (ref 1.7–7.7)
Neutrophils Relative %: 86 % — ABNORMAL HIGH (ref 43–77)
Platelets: 240 10*3/uL (ref 150–400)
RBC: 3.24 MIL/uL — ABNORMAL LOW (ref 3.87–5.11)
RDW: 17.7 % — ABNORMAL HIGH (ref 11.5–15.5)
WBC: 14.5 10*3/uL — AB (ref 4.0–10.5)

## 2013-11-07 LAB — PHOSPHORUS: PHOSPHORUS: 4.4 mg/dL (ref 2.3–4.6)

## 2013-11-07 LAB — BLOOD GAS, ARTERIAL
ACID-BASE DEFICIT: 0.7 mmol/L (ref 0.0–2.0)
Bicarbonate: 23.4 mEq/L (ref 20.0–24.0)
DRAWN BY: 31297
O2 CONTENT: 5 L/min
O2 Saturation: 89.7 %
PATIENT TEMPERATURE: 98.6
PCO2 ART: 37.8 mmHg (ref 35.0–45.0)
PH ART: 7.408 (ref 7.350–7.450)
TCO2: 24.5 mmol/L (ref 0–100)
pO2, Arterial: 57.1 mmHg — ABNORMAL LOW (ref 80.0–100.0)

## 2013-11-07 LAB — CREATININE, SERUM
Creatinine, Ser: 1.56 mg/dL — ABNORMAL HIGH (ref 0.50–1.10)
GFR calc Af Amer: 38 mL/min — ABNORMAL LOW (ref >90)
GFR calc non Af Amer: 33 mL/min — ABNORMAL LOW (ref >90)

## 2013-11-07 LAB — MAGNESIUM: Magnesium: 1.9 mg/dL (ref 1.5–2.5)

## 2013-11-07 LAB — GLUCOSE, CAPILLARY
Glucose-Capillary: 167 mg/dL — ABNORMAL HIGH (ref 70–99)
Glucose-Capillary: 175 mg/dL — ABNORMAL HIGH (ref 70–99)

## 2013-11-07 LAB — PRO B NATRIURETIC PEPTIDE: Pro B Natriuretic peptide (BNP): 22434 pg/mL — ABNORMAL HIGH (ref 0–125)

## 2013-11-07 MED ORDER — DOXERCALCIFEROL 4 MCG/2ML IV SOLN
INTRAVENOUS | Status: AC
  Filled 2013-11-07: qty 2

## 2013-11-07 MED ORDER — DARBEPOETIN ALFA-POLYSORBATE 40 MCG/0.4ML IJ SOLN
40.0000 ug | INTRAMUSCULAR | Status: DC
  Administered 2013-11-07 – 2013-11-09 (×2): 40 ug via INTRAVENOUS
  Filled 2013-11-07: qty 0.4

## 2013-11-07 MED ORDER — NITROGLYCERIN 0.4 MG SL SUBL: 0.4000 mg | SUBLINGUAL_TABLET | SUBLINGUAL | Status: DC | PRN

## 2013-11-07 MED ORDER — CALCIUM ACETATE 667 MG PO CAPS
667.0000 mg | ORAL_CAPSULE | Freq: Three times a day (TID) | ORAL | Status: DC
  Administered 2013-11-08 – 2013-11-12 (×12): 667 mg via ORAL
  Filled 2013-11-07 (×20): qty 1

## 2013-11-07 MED ORDER — ASPIRIN EC 81 MG PO TBEC
81.0000 mg | DELAYED_RELEASE_TABLET | Freq: Every day | ORAL | Status: DC
  Administered 2013-11-08 – 2013-11-13 (×6): 81 mg via ORAL
  Filled 2013-11-07 (×7): qty 1

## 2013-11-07 MED ORDER — LEVOTHYROXINE SODIUM 25 MCG PO TABS
25.0000 ug | ORAL_TABLET | Freq: Every day | ORAL | Status: DC
  Administered 2013-11-08 – 2013-11-13 (×6): 25 ug via ORAL
  Filled 2013-11-07 (×9): qty 1

## 2013-11-07 MED ORDER — AMLODIPINE BESYLATE 10 MG PO TABS
10.0000 mg | ORAL_TABLET | Freq: Every day | ORAL | Status: DC
  Administered 2013-11-08 – 2013-11-09 (×2): 10 mg via ORAL
  Filled 2013-11-07 (×3): qty 1

## 2013-11-07 MED ORDER — INSULIN GLARGINE 100 UNIT/ML ~~LOC~~ SOLN
25.0000 [IU] | Freq: Every day | SUBCUTANEOUS | Status: DC
  Administered 2013-11-07 – 2013-11-11 (×5): 25 [IU] via SUBCUTANEOUS
  Filled 2013-11-07 (×6): qty 0.25

## 2013-11-07 MED ORDER — DOXYCYCLINE HYCLATE 100 MG PO TABS
100.0000 mg | ORAL_TABLET | Freq: Two times a day (BID) | ORAL | Status: DC
  Administered 2013-11-07 – 2013-11-12 (×10): 100 mg via ORAL
  Filled 2013-11-07 (×11): qty 1

## 2013-11-07 MED ORDER — PANTOPRAZOLE SODIUM 40 MG PO TBEC
40.0000 mg | DELAYED_RELEASE_TABLET | Freq: Every day | ORAL | Status: DC
  Administered 2013-11-08 – 2013-11-13 (×6): 40 mg via ORAL
  Filled 2013-11-07 (×6): qty 1

## 2013-11-07 MED ORDER — LEVALBUTEROL HCL 0.63 MG/3ML IN NEBU
0.6300 mg | INHALATION_SOLUTION | Freq: Four times a day (QID) | RESPIRATORY_TRACT | Status: DC | PRN
  Administered 2013-11-09: 0.63 mg via RESPIRATORY_TRACT
  Filled 2013-11-07: qty 3

## 2013-11-07 MED ORDER — DARBEPOETIN ALFA-POLYSORBATE 40 MCG/0.4ML IJ SOLN
INTRAMUSCULAR | Status: AC
  Filled 2013-11-07: qty 0.4

## 2013-11-07 MED ORDER — SODIUM CHLORIDE 0.9 % IJ SOLN: 3.0000 mL | INTRAMUSCULAR | Status: DC | PRN

## 2013-11-07 MED ORDER — PREDNISONE 20 MG PO TABS
60.0000 mg | ORAL_TABLET | Freq: Once | ORAL | Status: AC
  Administered 2013-11-07: 60 mg via ORAL
  Filled 2013-11-07: qty 3

## 2013-11-07 MED ORDER — IPRATROPIUM-ALBUTEROL 0.5-2.5 (3) MG/3ML IN SOLN
3.0000 mL | Freq: Once | RESPIRATORY_TRACT | Status: AC
  Administered 2013-11-07: 3 mL via RESPIRATORY_TRACT
  Filled 2013-11-07: qty 3

## 2013-11-07 MED ORDER — ALBUMIN HUMAN 25 % IV SOLN
12.5000 g | Freq: Once | INTRAVENOUS | Status: AC
  Administered 2013-11-07: 12.5 g via INTRAVENOUS

## 2013-11-07 MED ORDER — SIMVASTATIN 10 MG PO TABS
10.0000 mg | ORAL_TABLET | Freq: Every evening | ORAL | Status: DC
  Administered 2013-11-08 – 2013-11-12 (×5): 10 mg via ORAL
  Filled 2013-11-07 (×7): qty 1

## 2013-11-07 MED ORDER — SODIUM CHLORIDE 0.9 % IJ SOLN
3.0000 mL | Freq: Two times a day (BID) | INTRAMUSCULAR | Status: DC
Start: 2013-11-07 — End: 2013-11-08
  Administered 2013-11-07 – 2013-11-08 (×2): 3 mL via INTRAVENOUS

## 2013-11-07 MED ORDER — METOPROLOL TARTRATE 12.5 MG HALF TABLET
12.5000 mg | ORAL_TABLET | Freq: Every day | ORAL | Status: DC
  Administered 2013-11-08 – 2013-11-13 (×6): 12.5 mg via ORAL
  Filled 2013-11-07 (×7): qty 1

## 2013-11-07 MED ORDER — ALBUMIN HUMAN 25 % IV SOLN
INTRAVENOUS | Status: AC
  Filled 2013-11-07: qty 50

## 2013-11-07 MED ORDER — DOXERCALCIFEROL 4 MCG/2ML IV SOLN
2.0000 ug | INTRAVENOUS | Status: DC
  Administered 2013-11-07 – 2013-11-11 (×3): 2 ug via INTRAVENOUS
  Filled 2013-11-07 (×3): qty 2

## 2013-11-07 MED ORDER — ISOSORBIDE MONONITRATE ER 60 MG PO TB24
120.0000 mg | ORAL_TABLET | Freq: Every day | ORAL | Status: DC
  Administered 2013-11-08 – 2013-11-13 (×6): 120 mg via ORAL
  Filled 2013-11-07 (×7): qty 2

## 2013-11-07 MED ORDER — SODIUM CHLORIDE 0.9 % IV SOLN: 250.0000 mL | INTRAVENOUS | Status: DC | PRN

## 2013-11-07 MED ORDER — CALCIUM ACETATE 667 MG PO CAPS: 667.0000 mg | ORAL_CAPSULE | Freq: Three times a day (TID) | ORAL | Status: DC

## 2013-11-07 MED ORDER — SODIUM CHLORIDE 0.9 % IJ SOLN
3.0000 mL | Freq: Two times a day (BID) | INTRAMUSCULAR | Status: DC
  Administered 2013-11-08 – 2013-11-13 (×10): 3 mL via INTRAVENOUS

## 2013-11-07 MED ORDER — HEPARIN SODIUM (PORCINE) 5000 UNIT/ML IJ SOLN
5000.0000 [IU] | Freq: Three times a day (TID) | INTRAMUSCULAR | Status: DC
  Administered 2013-11-07 – 2013-11-13 (×16): 5000 [IU] via SUBCUTANEOUS
  Filled 2013-11-07 (×20): qty 1

## 2013-11-07 MED ORDER — TIOTROPIUM BROMIDE MONOHYDRATE 18 MCG IN CAPS
18.0000 ug | ORAL_CAPSULE | Freq: Every day | RESPIRATORY_TRACT | Status: DC
  Administered 2013-11-08 – 2013-11-13 (×6): 18 ug via RESPIRATORY_TRACT
  Filled 2013-11-07 (×2): qty 5

## 2013-11-07 MED ORDER — INSULIN ASPART 100 UNIT/ML ~~LOC~~ SOLN
0.0000 [IU] | Freq: Three times a day (TID) | SUBCUTANEOUS | Status: DC
  Administered 2013-11-08: 3 [IU] via SUBCUTANEOUS
  Administered 2013-11-10: 09:00:00 via SUBCUTANEOUS
  Administered 2013-11-10: 2 [IU] via SUBCUTANEOUS
  Administered 2013-11-10: 5 [IU] via SUBCUTANEOUS

## 2013-11-07 MED ORDER — BUDESONIDE 0.25 MG/2ML IN SUSP
0.2500 mg | Freq: Two times a day (BID) | RESPIRATORY_TRACT | Status: DC
  Administered 2013-11-07 – 2013-11-13 (×12): 0.25 mg via RESPIRATORY_TRACT
  Filled 2013-11-07 (×16): qty 2

## 2013-11-07 NOTE — Progress Notes (Signed)
UR completed 

## 2013-11-07 NOTE — ED Notes (Signed)
Patient only able to sit up to side of bed Patient states that she is too weak to attempt to stand up and/or ambulate O2 sat 74-80% on 6L [place] while sitting up to side of bed Will make MD aware

## 2013-11-07 NOTE — ED Notes (Signed)
Bed: WP80 Expected date:  Expected time:  Means of arrival:  Comments: EMS 72yo SHOB, COPD, O2sats 88% on tx

## 2013-11-07 NOTE — ED Provider Notes (Signed)
CSN: [id number]     Arrival date & time 11/07/13  0409 History   First MD Initiated Contact with Patient 11/07/13 [phone]     Chief Complaint  Patient presents with  . Shortness of Breath  . COPD   (Consider location/radiation/quality/duration/timing/severity/associated sxs/prior Treatment) HPI  This is a 73 year old female who presents with shortness of breath. Patient has a history of diabetes, coronary artery disease, ESRD, hypertension, COPD and acute on chronic respiratory failure. Patient was just discharged from hospital on December 29. She was discharged home on 4 L nasal cannula. Per EMS, patient was satting 80% on 4 L [place] and she was given nebulizer treatment in route with some improvement of her symptoms. She was placed on non-breather for oxygenation. Patient states that she has had 2-3 days of worsening shortness of breath. She denies fever or cough. She's taken doxycycline as prescribed; however, she is not taking her prednisone because "it makes me feel jitteryand I can't sleep."  Patient is also on dialysis. She is due for dialysis this morning at 6 AM. She denies any worsening lower extremity swelling. Patient denies any chest pain.  Past Medical History  Diagnosis Date  . Diabetes mellitus   . Hypercholesterolemia   . Hypothyroidism   . Colitis   . Coronary artery disease   . Hypertension   . GERD (gastroesophageal reflux disease)   . Myocardial infarction   . Peripheral vascular disease   . Pneumonia   . Angina   . H/O Clostridium difficile infection   . Hx of pulmonary embolus   . COPD (chronic obstructive pulmonary disease)   . Shortness of breath     uses O2 as needed  . Chronic kidney disease     M-W-F   Tiburones  . Critical lower limb ischemia   . Claudication    Past Surgical History  Procedure Laterality Date  . Tubal ligation    . Amputaton of right fifth toe    . Tonsillectomy    . Insertion of dialysis catheter    . Av fistula placement  [id number]     right RUE AVF  . Bvt  May 18, 2012    First stage, Basilic Vein Transposition  . Bvt  07/27/12    Left arm Basilic Vein Transposition  . Arch aortogram  08/19/2012    Procedure: ARCH AORTOGRAM;  Surgeon: Fransisco Hertz, MD;  Location[facility];  Service: Vascular;  Laterality: N/A;  . Insertion of dialysis catheter  08/21/2012    Procedure: INSERTION OF DIALYSIS CATHETER;  Surgeon: Sherren Kerns, MD;  Location: [facility] OR;  Service: Vascular;  Laterality: Left;  internal jugular   Family History  Problem Relation Age of Onset  . Diabetes type II    . Hypertension    . Diabetes Mother   . Kidney disease Mother   . Diabetes Father   . Anesthesia problems Neg Hx    History  Substance Use Topics  . Smoking status: Former Smoker -- 1.00 packs/day for 40 years    Types: Cigarettes    Quit date: 06/11/2011  . Smokeless tobacco: Never Used  . Alcohol Use: No   OB History   Grav Para Term Preterm Abortions TAB SAB Ect Mult Living                 Review of Systems  Constitutional: Negative for fever.  Respiratory: Positive for cough, shortness of breath and wheezing. Negative for chest tightness.   Cardiovascular: Negative  for chest pain.  Gastrointestinal: Negative for nausea, vomiting and abdominal pain.  Genitourinary: Negative for dysuria.  Musculoskeletal: Negative for back pain.  Skin: Negative for wound.  Neurological: Negative for headaches.  Psychiatric/Behavioral: Negative for confusion.  All other systems reviewed and are negative.    Allergies  Procaine hcl  Home Medications   No current outpatient prescriptions on file. BP 121/39  Pulse 98  Temp(Src) 96.4 F (35.8 C) (Oral)  Resp 26  SpO2 100% Physical Exam  Nursing note and vitals reviewed. Constitutional: She is oriented to person, place, and time.  Chronically ill-appearing, tachypnic  HENT:  Head: Normocephalic and atraumatic.  Mouth/Throat: Oropharynx is clear and moist.  Eyes: Pupils are equal,  round, and reactive to light.  Neck: Neck supple. No JVD present.  Cardiovascular: Regular rhythm and normal heart sounds.   Tachycardia  Pulmonary/Chest: No respiratory distress. She has no wheezes.  Tachypnea, fair air movement, expiratory wheezing noted most prominently upper lobes,  Abdominal: Soft. Bowel sounds are normal. There is no tenderness.  Musculoskeletal:  1+ bilateral lower extremity edema  Neurological: She is alert and oriented to person, place, and time.  Skin: Skin is warm and dry.  Psychiatric: She has a normal mood and affect.    ED Course  Procedures (including critical care time) Labs Review Labs Reviewed  CBC WITH DIFFERENTIAL - Abnormal; Notable for the following:    WBC 14.5 (*)    RBC 3.24 (*)    Hemoglobin 10.0 (*)    HCT 32.0 (*)    RDW 17.7 (*)    Neutrophils Relative % 86 (*)    Neutro Abs 12.5 (*)    Lymphocytes Relative 9 (*)    All other components within normal limits  BASIC METABOLIC PANEL - Abnormal; Notable for the following:    Glucose, Bld 239 (*)    BUN 55 (*)    Creatinine, Ser 3.54 (*)    GFR calc non Af Amer 12 (*)    GFR calc Af Amer 14 (*)    All other components within normal limits  PRO B NATRIURETIC PEPTIDE - Abnormal; Notable for the following:    Pro B Natriuretic peptide (BNP) 22434.0 (*)    All other components within normal limits  RENAL FUNCTION PANEL  CBC   Imaging Review Dg Chest Portable 1 View  11/07/2013   CLINICAL DATA:  Shortness of breath.  COPD  EXAM: PORTABLE CHEST - 1 VIEW  COMPARISON:  10/28/2013  FINDINGS: Stable positioning of dialysis catheter from the left IJ. Chronic cardiomegaly.  Haziness of the lower chest consistent with effusions. There is pulmonary venous congestion. No pneumothorax.  IMPRESSION: 1. Small pleural effusions with basilar atelectasis or consolidation. 2. Pulmonary venous congestion.   Electronically Signed   By: Tiburcio Pea M.D.   On: 11/07/2013 04:59    EKG Interpretation     Date/Time:  Monday November 07 2013 04:24:43 EST Ventricular Rate:  106 PR Interval:  134 QRS Duration: 105 QT Interval:  339 QTC Calculation: 450 R Axis:   71 Text Interpretation:  Sinus tachycardia Anteroseptal infarct, old Repol abnrm, severe global ischemia (LM/MVD) No significant change since last tracing Confirmed by HORTON  MD, COURTNEY ([id number]) on 11/07/2013 4:29:03 AM            MDM   1. Acute-on-chronic respiratory failure   2. COPD exacerbation   3. End stage renal disease     Patient presents with shortness of breath. She is currently  requiring 5-6 L nasal cannula to maintain oxygen saturations. She was discharged on 3 L. Patient has multiple medical comorbidities and is due for dialysis. On initial exam patient had scant expiratory wheezing with fair air movement. She has not taken her prednisone for worse recent COPD exacerbation. Will give prednisone here.  Has been taking doxycycline at home and will hold off on additional antibiotics at this time. Chest x-ray with pleural effusions and elevated BNP would suggest volume overload; however, patient does not appear significantly systemically overloaded. Patient will be admitted for COPD exacerbation and volume overloaded. Patient was discussed with nephrology and admitting hospitalist.  Shon Batonourtney F Horton, MD 11/08/13 0730

## 2013-11-07 NOTE — Consult Note (Addendum)
Renal Service Consult Note [facility]  [name] [name] 11/07/2013 [name] Requesting Physician: Dr Cena Benton    Reason for Consult:  ESRD patient with cough and dyspnea HPI: The patient is a 73 y.o. year-old with hx of DM, HTN, ESRD and COPD. She was admitted recently from 12/24-12/29 with acute on chronic resp failure. She required bipap support initially and improved with nebs, steroids, dialysis and abx.  She was d/c'd home on 3L [place].  She returns to [facility] ED today with SOB and cough.  +orthopnea. Took a seven day course of po doxycyline this past week.  She was on prednisone taper but stopped due to jitteriness. She denies any fever, chills, sweats, prod cough, purulent sputum production.  No n/v/d, no abd pain.  She says she has not missed any dialysis.  ROS  no rash  no ha  no confusion  no jt pains  Past Medical History  Past Medical History  Diagnosis Date  . Diabetes mellitus   . Hypercholesterolemia   . Hypothyroidism   . Colitis   . Coronary artery disease   . Hypertension   . GERD (gastroesophageal reflux disease)   . Myocardial infarction   . Peripheral vascular disease   . Pneumonia   . Angina   . H/O Clostridium difficile infection   . Hx of pulmonary embolus   . COPD (chronic obstructive pulmonary disease)   . Shortness of breath     uses O2 as needed  . Chronic kidney disease     M-W-F   [place]  . Critical lower limb ischemia   . Claudication    Past Surgical History  Past Surgical History  Procedure Laterality Date  . Tubal ligation    . Amputaton of right fifth toe    . Tonsillectomy    . Insertion of dialysis catheter    . Av fistula placement  [id number]    right RUE AVF  . Bvt  May 18, 2012    First stage, Basilic Vein Transposition  . Bvt  07/27/12    Left arm Basilic Vein Transposition  . Arch aortogram  08/19/2012    Procedure: ARCH AORTOGRAM;  Surgeon: Fransisco Hertz, MD;  Location: [facility] OR;  Service: Vascular;  Laterality: N/A;   . Insertion of dialysis catheter  08/21/2012    Procedure: INSERTION OF DIALYSIS CATHETER;  Surgeon: Sherren Kerns, MD;  Location: [facility] OR;  Service: Vascular;  Laterality: Left;  internal jugular   Family History  Family History  Problem Relation Age of Onset  . Diabetes type II    . Hypertension    . Diabetes Mother   . Kidney disease Mother   . Diabetes Father   . Anesthesia problems Neg Hx    Social History  reports that she quit smoking about 2 years ago. Her smoking use included Cigarettes. She has a 40 pack-year smoking history. She has never used smokeless tobacco. She reports that she does not drink alcohol or use illicit drugs. Allergies  Allergies  Allergen Reactions  . Procaine Hcl Nausea And Vomiting and Other (See Comments)    "Pass out"   Home medications Prior to Admission medications   Medication Sig Start Date End Date Taking? Authorizing Provider  albuterol (PROVENTIL) (5 MG/ML) 0.5% nebulizer solution Take 0.5 mLs (2.5 mg total) by nebulization 3 (three) times daily. 10/31/13  Yes Jerald Kief, MD  amLODipine (NORVASC) 10 MG tablet Take 10 mg by mouth daily.  04/20/12  Yes Historical Provider, MD  aspirin EC 81 MG tablet Take 81 mg by mouth daily.   Yes Historical Provider, MD  budesonide (PULMICORT) 0.25 MG/2ML nebulizer solution Take 2 mLs (0.25 mg total) by nebulization 2 (two) times daily. 10/31/13  Yes Jerald KiefStephen K Chiu, MD  calcium acetate (PHOSLO) 667 MG capsule Take 667 mg by mouth 3 (three) times daily with meals.   Yes Historical Provider, MD  doxycycline (VIBRA-TABS) 100 MG tablet Take 1 tablet (100 mg total) by mouth every 12 (twelve) hours. 10/31/13  Yes Jerald KiefStephen K Chiu, MD  insulin glargine (LANTUS) 100 UNIT/ML injection Inject 25 Units into the skin at bedtime.  11/01/11  Yes Clydia LlanoMutaz Elmahi, MD  isosorbide mononitrate (IMDUR) 120 MG 24 hr tablet Take 120 mg by mouth daily.    Yes Historical Provider, MD  levothyroxine (SYNTHROID, LEVOTHROID) 25 MCG  tablet Take 25 mcg by mouth daily.    Yes Historical Provider, MD  metoprolol tartrate (LOPRESSOR) 25 MG tablet Take 12.5 mg by mouth daily.   Yes Historical Provider, MD  nitroGLYCERIN (NITROSTAT) 0.4 MG SL tablet Place 0.4 mg under the tongue every 5 (five) minutes as needed. For chest pain.   Yes Historical Provider, MD  NOVOLIN R RELION 100 UNIT/ML injection Inject 6-10 Units into the skin 3 (three) times daily. Sliding scale 10/15/13  Yes Historical Provider, MD  pantoprazole (PROTONIX) 40 MG tablet Take 40 mg by mouth daily.    Yes Historical Provider, MD  predniSONE (DELTASONE) 20 MG tablet Take 3 tablets (60 mg total) by mouth daily with breakfast. 10/31/13  Yes Jerald KiefStephen K Chiu, MD  Probiotic Product (PROBIOTIC PO) Take 1 capsule by mouth daily as needed (digestion).    Yes Historical Provider, MD  simvastatin (ZOCOR) 10 MG tablet Take 10 mg by mouth every evening.   Yes Historical Provider, MD   Recent Labs Lab 11/07/13 0455  WBC 14.5*  NEUTROABS 12.5*  HGB 10.0*  HCT 32.0*  MCV 98.8  PLT 240     Recent Labs Lab 11/07/13 0455  NA 138  K 4.5  CL 97  CO2 24  GLUCOSE 239*  BUN 55*  CREATININE 3.54*  CALCIUM 8.9    Exam  Blood pressure 137/113, pulse 111, temperature 98.4 F (36.9 C), temperature source Oral, resp. rate 23, SpO2 86.00%. Elderly pleasant female, O2sat high 80's on 6L [place], not in distress, no inc in wob No rash, cyanosis Sclera anicteric, throat clear JVP 12cm Faint basilar rales bilat, no wheezing RRR ?summation gallop Abd soft, nt/nd, no ascites or mass No LE or UE edema Neuro nf, ox3, alert Left upper chest tunneled HD cath in place  Echo July 2014 EF 55%  Dialysis: [facility] on MWF .  4h   67kg   F160    Bath 2K/2.5Ca    Heparin none   L IJ TDC    BFR 400 UF profile 4  Hectorol 2   EPO 5000   Venofer 50/wk    Assessment/Plan: 1. Dyspnea / underlying COPD / prob volume excess: [facility], maximum UF as tolerated; time was  increased to 4 hours last admission so 4 hour TMT today 2. ESRD: HD today on schedule 3. Anemia / CKD: Hb 10, cont esa 4. MBD / CKD: cont vit D, phoslo 1 ac, last pth 334 5. HTN/volume: cont norvasc/metoprolol, lower volume w HD 6. COPD 7. Multiple access failures: both arms, have not tried leg grafts due to PAD, cath-dependent  [name] MD (pgr) [phone]    ([phone] 11/07/2013, 9:59 AM

## 2013-11-07 NOTE — H&P (Signed)
[facility] History and Physical  [name] [name]:[id number] DOB: [birth year] DOA: 11/07/2013  Referring physician: Dr. Joanie Coddington PCP: Quentin Mulling, MD  Specialists: Dr. Arlean Hopping  Chief Complaint: Worsening shortness of breath  HPI: [name] [name] is a 73 y.o. female  With history of COPD on home oxygen 3 L and end-stage renal disease on hemodialysis scheduled Monday Wednesdays and Fridays. Was recently admitted for COPD exacerbation at the end of last month and complete a seven-day course of doxycycline. Patient was also sent home on prednisone which she took initially but reports discontinuing because she felt jittery at night. Presented to the ED today after being more short of breath than usual and requiring increased oxygen supplementation. Reportedly in the field patient was hypoxic in the 80s increase oxygen supplementation above her baseline. The patient states she has had worsening shortness of breath for the last week. The problem has been persistent since onset and has been gradually getting worse. Nothing she is aware of makes it better or worse. She denies any hemoptysis, fevers, productive cough, or sick contacts. Nothing she is aware of makes it better. Activity makes it worse.  She does report the he recently had a change in her hemodialysis schedule due to the new years schedule change.  While in the ED patient was found to have elevated BNP of 22,000, chest x-ray interpreted as pulmonary venous congestion and small pleural effusions with basilar atelectasis or consolidation.   Review of Systems:  Constitutional:  No weight loss, night sweats, Fevers, chills, fatigue.  HEENT:  No headaches, Difficulty swallowing,Tooth/dental problems,Sore throat,  No sneezing, itching, ear ache, nasal congestion, post nasal drip,  Cardio-vascular:  No chest pain, Orthopnea, PND, swelling in lower extremities, anasarca, dizziness, palpitations  GI:  No heartburn, indigestion, abdominal pain,  nausea, vomiting, diarrhea, change in bowel habits, loss of appetite  Resp:  + shortness of breath with exertion or at rest. No excess mucus, no productive cough, + non-productive cough, No coughing up of blood.No change in color of mucus.+ wheezing.No chest wall deformity  Skin:  no rash or lesions.  GU:  no dysuria, change in color of urine, no urgency or frequency. No flank pain.  Musculoskeletal:  No joint pain or swelling. No decreased range of motion. No back pain.  Psych:  No change in mood or affect. No depression or anxiety. No memory loss.   Past Medical History  Diagnosis Date  . Diabetes mellitus   . Hypercholesterolemia   . Hypothyroidism   . Colitis   . Coronary artery disease   . Hypertension   . GERD (gastroesophageal reflux disease)   . Myocardial infarction   . Peripheral vascular disease   . Pneumonia   . Angina   . H/O Clostridium difficile infection   . Hx of pulmonary embolus   . COPD (chronic obstructive pulmonary disease)   . Shortness of breath     uses O2 as needed  . Chronic kidney disease     M-W-F   [place]  . Critical lower limb ischemia   . Claudication    Past Surgical History  Procedure Laterality Date  . Tubal ligation    . Amputaton of right fifth toe    . Tonsillectomy    . Insertion of dialysis catheter    . Av fistula placement  [id number]    right RUE AVF  . Bvt  May 18, 2012    First stage, Basilic Vein Transposition  . Bvt  07/27/12  Left arm Basilic Vein Transposition  . Arch aortogram  08/19/2012    Procedure: ARCH AORTOGRAM;  Surgeon: Fransisco Hertz, MD;  Location: [facility] OR;  Service: Vascular;  Laterality: N/A;  . Insertion of dialysis catheter  08/21/2012    Procedure: INSERTION OF DIALYSIS CATHETER;  Surgeon: Sherren Kerns, MD;  Location: [facility] OR;  Service: Vascular;  Laterality: Left;  internal jugular   Social History:  reports that she quit smoking about 2 years ago. Her smoking use included Cigarettes. She has a 40  pack-year smoking history. She has never used smokeless tobacco. She reports that she does not drink alcohol or use illicit drugs.  Allergies  Allergen Reactions  . Procaine Hcl Nausea And Vomiting and Other (See Comments)    "Pass out"    Family History  Problem Relation Age of Onset  . Diabetes type II    . Hypertension    . Diabetes Mother   . Kidney disease Mother   . Diabetes Father   . Anesthesia problems Neg Hx      Prior to Admission medications   Medication Sig Start Date End Date Taking? Authorizing Provider  albuterol (PROVENTIL) (5 MG/ML) 0.5% nebulizer solution Take 0.5 mLs (2.5 mg total) by nebulization 3 (three) times daily. 10/31/13  Yes Jerald Kief, MD  amLODipine (NORVASC) 10 MG tablet Take 10 mg by mouth daily.  04/20/12  Yes Historical Provider, MD  aspirin EC 81 MG tablet Take 81 mg by mouth daily.   Yes Historical Provider, MD  budesonide (PULMICORT) 0.25 MG/2ML nebulizer solution Take 2 mLs (0.25 mg total) by nebulization 2 (two) times daily. 10/31/13  Yes Jerald Kief, MD  calcium acetate (PHOSLO) 667 MG capsule Take 667 mg by mouth 3 (three) times daily with meals.   Yes Historical Provider, MD  doxycycline (VIBRA-TABS) 100 MG tablet Take 1 tablet (100 mg total) by mouth every 12 (twelve) hours. 10/31/13  Yes Jerald Kief, MD  insulin glargine (LANTUS) 100 UNIT/ML injection Inject 25 Units into the skin at bedtime.  11/01/11  Yes Clydia Llano, MD  isosorbide mononitrate (IMDUR) 120 MG 24 hr tablet Take 120 mg by mouth daily.    Yes Historical Provider, MD  levothyroxine (SYNTHROID, LEVOTHROID) 25 MCG tablet Take 25 mcg by mouth daily.    Yes Historical Provider, MD  metoprolol tartrate (LOPRESSOR) 25 MG tablet Take 12.5 mg by mouth daily.   Yes Historical Provider, MD  nitroGLYCERIN (NITROSTAT) 0.4 MG SL tablet Place 0.4 mg under the tongue every 5 (five) minutes as needed. For chest pain.   Yes Historical Provider, MD  NOVOLIN R RELION 100 UNIT/ML  injection Inject 6-10 Units into the skin 3 (three) times daily. Sliding scale 10/15/13  Yes Historical Provider, MD  pantoprazole (PROTONIX) 40 MG tablet Take 40 mg by mouth daily.    Yes Historical Provider, MD  predniSONE (DELTASONE) 20 MG tablet Take 3 tablets (60 mg total) by mouth daily with breakfast. 10/31/13  Yes Jerald Kief, MD  Probiotic Product (PROBIOTIC PO) Take 1 capsule by mouth daily as needed (digestion).    Yes Historical Provider, MD  simvastatin (ZOCOR) 10 MG tablet Take 10 mg by mouth every evening.   Yes Historical Provider, MD   Physical Exam: Filed Vitals:   11/07/13 0739  BP: 137/113  Pulse: 111  Temp: 98.4 F (36.9 C)  Resp: 23    BP 137/113  Pulse 111  Temp(Src) 98.4 F (36.9 C) (Oral)  Resp  23  SpO2 86%  General:  Patient is alert and awake, in no acute distress Eyes: PERRL, normal lids, irises & conjunctiva ENT: grossly normal hearing, lips & tongue Neck: Supple, no masses or thyromegaly Cardiovascular: RRR, no m/r/g. No LE edema. Telemetry: SR, no arrhythmias  Respiratory: Patient with increased work of breathing with interrupted symphysis, rhales diffusely in both lung fields, no wheeze Abdomen: soft, nt nd Skin: no rash or induration seen on limited exam Musculoskeletal: grossly normal tone BUE/BLE Psychiatric: grossly normal mood and affect, speech fluent and appropriate Neurologic: grossly non-focal.          Labs on Admission:  Basic Metabolic Panel:  Recent Labs Lab 11/07/13 0455  NA 138  K 4.5  CL 97  CO2 24  GLUCOSE 239*  BUN 55*  CREATININE 3.54*  CALCIUM 8.9   Liver Function Tests: No results found for this basename: AST, ALT, ALKPHOS, BILITOT, PROT, ALBUMIN,  in the last 168 hours No results found for this basename: LIPASE, AMYLASE,  in the last 168 hours No results found for this basename: AMMONIA,  in the last 168 hours CBC:  Recent Labs Lab 11/07/13 0455  WBC 14.5*  NEUTROABS 12.5*  HGB 10.0*  HCT 32.0*   MCV 98.8  PLT 240   Cardiac Enzymes: No results found for this basename: CKTOTAL, CKMB, CKMBINDEX, TROPONINI,  in the last 168 hours  BNP (last 3 results)  Recent Labs  10/26/13 0510 11/07/13 0455  PROBNP 9552.0* 22434.0*   CBG:  Recent Labs Lab 10/31/13 1130 10/31/13 1614  GLUCAP 231* 307*    Radiological Exams on Admission: Dg Chest Portable 1 View  11/07/2013   CLINICAL DATA:  Shortness of breath.  COPD  EXAM: PORTABLE CHEST - 1 VIEW  COMPARISON:  10/28/2013  FINDINGS: Stable positioning of dialysis catheter from the left IJ. Chronic cardiomegaly.  Haziness of the lower chest consistent with effusions. There is pulmonary venous congestion. No pneumothorax.  IMPRESSION: 1. Small pleural effusions with basilar atelectasis or consolidation. 2. Pulmonary venous congestion.   Electronically Signed   By: Tiburcio PeaJonathan  Watts M.D.   On: 11/07/2013 04:59    EKG: Independently reviewed. Sinus rhythm unchanged when compared to last EKG on 10/28/2013  Assessment/Plan  Principal problem:  Acute respiratory failure with hypoxia  - Given history of recent change in hemodialysis schedule as well as 7 day course of doxycycline. At this time I suspect that patient's symptoms are secondary to being fluid overloaded. I have discussed this with the pathologist and currently plans are for dialysis today. I suspect after dialysis patient's respiratory rate and shortness of breath will be much improved. - Suspect patient most likely retained fluid from recent steroid administration for her COPD exacerbation last month December 2014. - Obtain ABG  Active Problems:   COPD exacerbation - Will continue bronchodilators, add Spiriva, continue inhaled steroids - No wheezing on exam, patient given prednisone in the ED. I suspect shortness of breath secondary to being fluid overloaded and as such will hold prednisone at this time. But should patient have wheezes may consider restarting.  DM - Continue home  Lantus dose - Diabetic diet - Routine blood sugar monitoring  ESRD on HD - Nephrology on board and making plans to dialyze today 11/07/2013   Code Status: Full code Family Communication: Discussed directly with patient, no family present Disposition Plan: Once patient breathing is back to baseline may consider discharging back home. Patient is on 3 L of oxygen at baseline  Time spent: > 70 minutes of critical care time  Penny Pia [facility] Pager [phone]

## 2013-11-07 NOTE — Procedures (Signed)
I have personally attended this patient's dialysis session.   2K bath UF goal 3.5 liters (no wt obtained; EDW 67) TDC 400 Still on BIPAP but breathing much better with 2466 ml off thus far. Margie Urbanowicz B

## 2013-11-07 NOTE — ED Notes (Signed)
Patient place on NRB mask--O2 sat now 100%

## 2013-11-07 NOTE — ED Notes (Signed)
Attempted to place patient back on [place], but O2 sat decreased to 86-88% NRB mask placed back on patient MD made aware

## 2013-11-07 NOTE — ED Notes (Signed)
Patient arrives via EMS r/t c/o SOB Patient on 4L [place] at home O2 sat 80% on 4L [place] when EMS arrived Patient just discharged from [facility] Albuterol 2.5 neb tx given by EMS Patient placed on NRB s/p neb tx due to decreasing O2 sat Dr. Wilkie Aye at bedside  Patient with hx of COPD

## 2013-11-08 ENCOUNTER — Inpatient Hospital Stay (HOSPITAL_COMMUNITY): Payer: Medicare Other

## 2013-11-08 ENCOUNTER — Encounter (HOSPITAL_COMMUNITY): Payer: Self-pay | Admitting: Radiology

## 2013-11-08 ENCOUNTER — Encounter (HOSPITAL_BASED_OUTPATIENT_CLINIC_OR_DEPARTMENT_OTHER): Payer: Medicare Other | Attending: General Surgery

## 2013-11-08 DIAGNOSIS — N186 End stage renal disease: Secondary | ICD-10-CM

## 2013-11-08 LAB — GLUCOSE, CAPILLARY
GLUCOSE-CAPILLARY: 173 mg/dL — AB (ref 70–99)
Glucose-Capillary: 70 mg/dL (ref 70–99)
Glucose-Capillary: 115 mg/dL — ABNORMAL HIGH (ref 70–99)
Glucose-Capillary: 164 mg/dL — ABNORMAL HIGH (ref 70–99)

## 2013-11-08 LAB — RENAL FUNCTION PANEL
Albumin: 3.6 g/dL (ref 3.5–5.2)
BUN: 29 mg/dL — ABNORMAL HIGH (ref 6–23)
CALCIUM: 8.9 mg/dL (ref 8.4–10.5)
CO2: 27 mEq/L (ref 19–32)
CREATININE: 2.29 mg/dL — AB (ref 0.50–1.10)
Chloride: 92 mEq/L — ABNORMAL LOW (ref 96–112)
GFR calc Af Amer: 24 mL/min — ABNORMAL LOW (ref >90)
GFR calc non Af Amer: 20 mL/min — ABNORMAL LOW (ref >90)
GLUCOSE: 86 mg/dL (ref 70–99)
PHOSPHORUS: 4.2 mg/dL (ref 2.3–4.6)
Potassium: 4 mEq/L (ref 3.7–5.3)
SODIUM: 136 meq/L — AB (ref 137–147)

## 2013-11-08 LAB — CBC
HCT: 32.8 % — ABNORMAL LOW (ref 36.0–46.0)
Hemoglobin: 10.5 g/dL — ABNORMAL LOW (ref 12.0–15.0)
MCH: 31.2 pg (ref 26.0–34.0)
MCHC: 32 g/dL (ref 30.0–36.0)
MCV: 97.3 fL (ref 78.0–100.0)
Platelets: 236 10*3/uL (ref 150–400)
RBC: 3.37 MIL/uL — ABNORMAL LOW (ref 3.87–5.11)
RDW: 17.7 % — ABNORMAL HIGH (ref 11.5–15.5)
WBC: 13.5 10*3/uL — ABNORMAL HIGH (ref 4.0–10.5)

## 2013-11-08 MED ORDER — IOHEXOL 350 MG/ML SOLN
100.0000 mL | Freq: Once | INTRAVENOUS | Status: AC | PRN
  Administered 2013-11-08: 100 mL via INTRAVENOUS

## 2013-11-08 NOTE — Clinical Documentation Improvement (Signed)
Possible Clinical Conditions?  Acute Systolic Congestive Heart Failure Acute Diastolic Congestive Heart Failure Acute Systolic & Diastolic Congestive Heart Failure Acute on Chronic Systolic Congestive Heart Failure Acute on Chronic Diastolic Congestive Heart Failure Acute on Chronic Systolic & Diastolic Congestive Heart Failure Other Condition Cannot Clinically Determine   Risk Factors: Suspect patient's symptoms 2/2 being fluid overloaded, noted per 01/05 H&P. CHF noted per discharge summary of 10/31/13.  Diagnostics: 01/05: proBNP: 22,434.0  Thank You, Marciano Sequin, Clinical Documentation Specialist:  [phone]  [facility]- Health Information Management

## 2013-11-08 NOTE — Progress Notes (Signed)
Subjective: Feels better but not quite back to normal breathing wise Got to EDW yesterday with dialysis (EDW 67 - got to 67.3 kg) Wants to eat Off BIPAP/on 5 liters )2 with sats 92-93%  Objective:    Vital signs in last 24 hours: Filed Vitals:   11/07/13 2146 11/08/13 0014 11/08/13 0300 11/08/13 0400  BP:  145/64 143/68 150/73  Pulse:  85 90   Temp:  98.2 F (36.8 C)  98.2 F (36.8 C)  TempSrc:  Oral  Oral  Resp:  18 21 20   Height: 5\' 5"  (1.651 m)     Weight:    67.2 kg (148 lb 2.4 oz)  SpO2:  93% 92%    Weight change:   Intake/Output Summary (Last 24 hours) at 11/08/13 0817 Last data filed at 11/07/13 1558  Gross per 24 hour  Intake      0 ml  Output   3500 ml  Net  -3500 ml    Physical Exam:  Blood pressure 150/73, pulse 90, temperature 98.2 F (36.8 C), temperature source Oral, resp. rate 20, height 5\' 5"  (1.651 m), weight 67.2 kg (148 lb 2.4 oz), SpO2 92.00%. Elderly pleasant female, nasal cannula; NAD No rash, cyanosis  Sclera anicteric, throat clear  Overall poor air movement with short exp wheeze left upper ant chest RRR S1S2 no S3 Abd obese, soft, nt/nd No LE or UE edema  Neuro nf, ox3, alert  Left upper chest tunneled HD cath in place   Recent Labs Lab 11/07/13 0455 11/07/13 2042 11/08/13 0430  NA 138  --  136*  K 4.5  --  4.0  CL 97  --  92*  CO2 24  --  27  GLUCOSE 239*  --  86  BUN 55*  --  29*  CREATININE 3.54* 1.56* 2.29*  CALCIUM 8.9  --  8.9  PHOS  --  4.4 4.2     Recent Labs Lab 11/08/13 0430  ALBUMIN 3.6    Recent Labs Lab 11/07/13 0455 11/07/13 2042 11/08/13 0430  WBC 14.5* 12.6* 13.5*  NEUTROABS 12.5*  --   --   HGB 10.0* 10.3* 10.5*  HCT 32.0* 31.6* 32.8*  MCV 98.8 97.5 97.3  PLT 240 250 236   ABG (done post dialysis)    Component Value Date/Time   PHART 7.408 11/07/2013 1815   PCO2ART 37.8 11/07/2013 1815   PO2ART 57.1* 11/07/2013 1815   HCO3 23.4 11/07/2013 1815   TCO2 24.5 11/07/2013 1815   ACIDBASEDEF 0.7 11/07/2013  1815   O2SAT 89.7 11/07/2013 1815    Recent Labs Lab 11/07/13 1808 11/07/13 2019 11/08/13 0751  GLUCAP 167* 175* 70   Studies/Results: Dg Chest Portable 1 View  11/07/2013   CLINICAL DATA:  Shortness of breath.  COPD  EXAM: PORTABLE CHEST - 1 VIEW  COMPARISON:  10/28/2013  FINDINGS: Stable positioning of dialysis catheter from the left IJ. Chronic cardiomegaly.  Haziness of the lower chest consistent with effusions. There is pulmonary venous congestion. No pneumothorax.  IMPRESSION: 1. Small pleural effusions with basilar atelectasis or consolidation. 2. Pulmonary venous congestion.   Electronically Signed   By: Tiburcio PeaJonathan  Watts M.D.   On: 11/07/2013 04:59   Scheduled Medications:   . amLODipine  10 mg Oral Daily  . aspirin EC  81 mg Oral Daily  . budesonide  0.25 mg Nebulization BID  . calcium acetate  667 mg Oral TID WC  . darbepoetin  40 mcg Intravenous Q7 days  . doxercalciferol  2 mcg Intravenous Q M,W,F-HD  . doxycycline  100 mg Oral Q12H  . heparin  5,000 Units Subcutaneous Q8H  . insulin aspart  0-15 Units Subcutaneous TID WC  . insulin glargine  25 Units Subcutaneous QHS  . isosorbide mononitrate  120 mg Oral Daily  . levothyroxine  25 mcg Oral QAC breakfast  . metoprolol tartrate  12.5 mg Oral Daily  . pantoprazole  40 mg Oral Daily  . simvastatin  10 mg Oral QPM  . sodium chloride  3 mL Intravenous Q12H  . sodium chloride  3 mL Intravenous Q12H  . tiotropium  18 mcg Inhalation Daily    I  have reviewed scheduled and prn medications.  Dialysis: [facility] on MWF .  4h 67kg F160 Bath 2K/2.5Ca Heparin none L IJ TDC BFR 400 UF profile 4  Hectorol 2 EPO 5000 Venofer 50/wk   ASSESSMENT/RECOMMENDATIONS  Dyspnea / underlying COPD / prob volume excess: s/p HD 1/5. Got to EDW. No peripheral edema now.  Still not back to baseline breathing wise.  CXR planned.  Otherwise appears euvolemic with absolutely no edema; on nebs and po doxy; no steroids at this time ESRD: HD MWF EDW 67  F/U CXR Anemia / CKD: Hb 10, cont esa  MBD / CKD: cont vit D, phoslo 1 ac, last pth 334  HTN/volume: cont norvasc/metoprolol, lower volume w HD  COPD  Multiple access failures: catheter-dependent   Camille Bal, MD [facility] [phone] Pager 11/08/2013, 8:17 AM

## 2013-11-08 NOTE — Progress Notes (Addendum)
[facility] Progress Note  TEAM 1 - Stepdown/ICU TEAM   [name] SnipeJudy A [name] ZOX:[id number] DOB: 08/17/1941 DOA: 11/07/2013 PCP: [name] MullingHOOPER,[name], MD  Brief narrative: With history of COPD on home oxygen 3 L and end-stage renal disease on hemodialysis scheduled Monday Wednesdays and Fridays. Was recently admitted for COPD exacerbation, discharged on 12/29. States she complete a seven-day course of doxycycline but did not tolerate the Prednisone due to feeling jittery therefore, did not complete the course. Presented to the ED with increasing dyspnea and increased O2 requirements. CXR suggestive of pulmonary congestion and emergent dialysis performed after admission. Apparently had diuresed to expected dry weight at last treatment.   Subjective: Feels short of breath still but much better than yesterday. Has a cough productive of white sputum. No chest pain.   Assessment/Plan: Principal Problem:   Acute respiratory failure with hypoxia (a) COPD exacerbation?  - current still on higher than usual amount of O2 (5 L where baseline is 2-3 L )- -  repeat CXR does not reveal further pulmonary edema or pneumonia but does reveal b/l effusions - received a dose of Prednisone in ER yesterday after which it was held  start IS- (b) Fluid overload in ESRD - as mentioned above, she has been dialyzed back to dry weight and is still requiring 5 L O2 - CXR and physical exam reveal her to be euvolemic however CT chest obtained today (to r/o PE) reveals collapse of lower lobes due to progressive effusions- doubt that these effusions can be dialyzed off- will need to consult IR for thoracentesis if no progress made with dialysis   Active Problems:    Diabetes mellitus - on home dose of Lantus and sliding scale insulin - sugars stable  Hypothyroid - cont Synthroid  HTN - cont home meds   GERD - cont Protonix   H/o DVT and PE - 2 yrs ago- CTA chest without evidence of PE  Code Status: Full  code Family Communication: none  Disposition Plan: to be determined  Consultants: Nephrology  Procedures: none  Antibiotics: None  DVT prophylaxis: Heparin   Objective: Blood pressure 125/91, pulse 78, temperature 97.9 F (36.6 [name]), temperature source Oral, resp. rate 21, height 5\' 5"  (1.651 m), weight 67.2 kg (148 lb 2.4 oz), SpO2 91.00%.  Intake/Output Summary (Last 24 hours) at 11/08/13 1530 Last data filed at 11/08/13 1000  Gross per 24 hour  Intake    243 ml  Output   3500 ml  Net  -3257 ml     Exam: General: No acute respiratory distress- AAO x 3-  Lungs: very poor air entry - very mild wheeze in Left chest- 91% on 5 L Cardiovascular: Regular rate and rhythm without murmur gallop or rub normal S1 and S2 Abdomen: Nontender, nondistended, soft, bowel sounds positive, no rebound, no ascites, no appreciable mass Extremities: No significant cyanosis, clubbing, or edema bilateral lower extremities  Data Reviewed: Basic Metabolic Panel:  Recent Labs Lab 11/07/13 0455 11/07/13 2042 11/08/13 0430  NA 138  --  136*  K 4.5  --  4.0  CL 97  --  92*  CO2 24  --  27  GLUCOSE 239*  --  86  BUN 55*  --  29*  CREATININE 3.54* 1.56* 2.29*  CALCIUM 8.9  --  8.9  MG  --  1.9  --   PHOS  --  4.4 4.2   Liver Function Tests:  Recent Labs Lab 11/08/13 0430  ALBUMIN 3.6  No results found for this basename: LIPASE, AMYLASE,  in the last 168 hours No results found for this basename: AMMONIA,  in the last 168 hours CBC:  Recent Labs Lab 11/07/13 0455 11/07/13 2042 11/08/13 0430  WBC 14.5* 12.6* 13.5*  NEUTROABS 12.5*  --   --   HGB 10.0* 10.3* 10.5*  HCT 32.0* 31.6* 32.8*  MCV 98.8 97.5 97.3  PLT 240 250 236   Cardiac Enzymes: No results found for this basename: CKTOTAL, CKMB, CKMBINDEX, TROPONINI,  in the last 168 hours BNP (last 3 results)  Recent Labs  10/26/13 0510 11/07/13 0455  PROBNP 9552.0* 22434.0*   CBG:  Recent Labs Lab 11/07/13 1808  11/07/13 2019 11/08/13 0751 11/08/13 1208  GLUCAP 167* 175* 70 115*    No results found for this or any previous visit (from the past 240 hour(s)).   Studies:  Recent x-ray studies have been reviewed in detail by the Attending Physician  Scheduled Meds:  Scheduled Meds: . amLODipine  10 mg Oral Daily  . aspirin EC  81 mg Oral Daily  . budesonide  0.25 mg Nebulization BID  . calcium acetate  667 mg Oral TID WC  . darbepoetin  40 mcg Intravenous Q7 days  . doxercalciferol  2 mcg Intravenous Q M,W,F-HD  . doxycycline  100 mg Oral Q12H  . heparin  5,000 Units Subcutaneous Q8H  . insulin aspart  0-15 Units Subcutaneous TID WC  . insulin glargine  25 Units Subcutaneous QHS  . isosorbide mononitrate  120 mg Oral Daily  . levothyroxine  25 mcg Oral QAC breakfast  . metoprolol tartrate  12.5 mg Oral Daily  . pantoprazole  40 mg Oral Daily  . simvastatin  10 mg Oral QPM  . sodium chloride  3 mL Intravenous Q12H  . sodium chloride  3 mL Intravenous Q12H  . tiotropium  18 mcg Inhalation Daily   Continuous Infusions:   Time spent on care of this patient: >35   Calvert Cantor, MD  [facility] Office  [phone] Pager - Text Page per Loretha Stapler as per below:  On-Call/Text Page:      [email or web address]      password [email or web address]  If 7PM-7AM, please contact night-coverage [email or web address] Password [email or web address] 11/08/2013, 3:30 PM   LOS: 1 day

## 2013-11-08 NOTE — Progress Notes (Signed)
Patient having trouble maintaining O2 saturations above 88%.  Placed on Venturi Mask, 14 L at 55%.  Saturation increased to >96%.

## 2013-11-09 LAB — CBC
HEMATOCRIT: 30.5 % — AB (ref 36.0–46.0)
Hemoglobin: 10.2 g/dL — ABNORMAL LOW (ref 12.0–15.0)
MCH: 31.8 pg (ref 26.0–34.0)
MCHC: 33.4 g/dL (ref 30.0–36.0)
MCV: 95 fL (ref 78.0–100.0)
Platelets: 258 10*3/uL (ref 150–400)
RBC: 3.21 MIL/uL — AB (ref 3.87–5.11)
RDW: 17.2 % — ABNORMAL HIGH (ref 11.5–15.5)
WBC: 13.5 10*3/uL — AB (ref 4.0–10.5)

## 2013-11-09 LAB — RENAL FUNCTION PANEL
Albumin: 3.1 g/dL — ABNORMAL LOW (ref 3.5–5.2)
BUN: 65 mg/dL — AB (ref 6–23)
CHLORIDE: 89 meq/L — AB (ref 96–112)
CO2: 28 meq/L (ref 19–32)
CREATININE: 3.7 mg/dL — AB (ref 0.50–1.10)
Calcium: 8.3 mg/dL — ABNORMAL LOW (ref 8.4–10.5)
GFR calc Af Amer: 13 mL/min — ABNORMAL LOW (ref >90)
GFR calc non Af Amer: 11 mL/min — ABNORMAL LOW (ref >90)
Glucose, Bld: 139 mg/dL — ABNORMAL HIGH (ref 70–99)
Phosphorus: 4.3 mg/dL (ref 2.3–4.6)
Potassium: 3.9 mEq/L (ref 3.7–5.3)
Sodium: 133 mEq/L — ABNORMAL LOW (ref 137–147)

## 2013-11-09 LAB — GLUCOSE, CAPILLARY
GLUCOSE-CAPILLARY: 109 mg/dL — AB (ref 70–99)
GLUCOSE-CAPILLARY: 170 mg/dL — AB (ref 70–99)
Glucose-Capillary: 91 mg/dL (ref 70–99)
Glucose-Capillary: 155 mg/dL — ABNORMAL HIGH (ref 70–99)

## 2013-11-09 MED ORDER — DARBEPOETIN ALFA-POLYSORBATE 40 MCG/0.4ML IJ SOLN
INTRAMUSCULAR | Status: AC
  Administered 2013-11-09: 40 ug via INTRAVENOUS
  Filled 2013-11-09: qty 0.4

## 2013-11-09 MED ORDER — SODIUM CHLORIDE 0.9 % IV SOLN: 100.0000 mL | INTRAVENOUS | Status: DC | PRN

## 2013-11-09 MED ORDER — HEPARIN SODIUM (PORCINE) 1000 UNIT/ML DIALYSIS: 1000.0000 [IU] | INTRAMUSCULAR | Status: DC | PRN

## 2013-11-09 MED ORDER — SODIUM CHLORIDE 0.9 % IV SOLN
100.0000 mL | INTRAVENOUS | Status: DC | PRN
Start: 2013-11-09 — End: 2013-11-09

## 2013-11-09 MED ORDER — ALTEPLASE 2 MG IJ SOLR
2.0000 mg | Freq: Once | INTRAMUSCULAR | Status: DC | PRN
  Filled 2013-11-09: qty 2

## 2013-11-09 MED ORDER — DOXERCALCIFEROL 4 MCG/2ML IV SOLN
INTRAVENOUS | Status: AC
  Administered 2013-11-09: 2 ug via INTRAVENOUS
  Filled 2013-11-09: qty 2

## 2013-11-09 MED ORDER — LIDOCAINE-PRILOCAINE 2.5-2.5 % EX CREA
1.0000 "application " | TOPICAL_CREAM | CUTANEOUS | Status: DC | PRN
  Filled 2013-11-09: qty 5

## 2013-11-09 MED ORDER — LEVALBUTEROL HCL 0.63 MG/3ML IN NEBU
0.6300 mg | INHALATION_SOLUTION | Freq: Four times a day (QID) | RESPIRATORY_TRACT | Status: DC
  Administered 2013-11-09 – 2013-11-13 (×12): 0.63 mg via RESPIRATORY_TRACT
  Filled 2013-11-09 (×26): qty 3

## 2013-11-09 MED ORDER — NEPRO/CARBSTEADY PO LIQD
237.0000 mL | ORAL | Status: DC | PRN
Start: 2013-11-09 — End: 2013-11-09
  Filled 2013-11-09: qty 237

## 2013-11-09 MED ORDER — LIDOCAINE HCL (PF) 1 % IJ SOLN: 5.0000 mL | INTRAMUSCULAR | Status: DC | PRN

## 2013-11-09 MED ORDER — PENTAFLUOROPROP-TETRAFLUOROETH EX AERO: 1.0000 "application " | INHALATION_SPRAY | CUTANEOUS | Status: DC | PRN

## 2013-11-09 NOTE — Progress Notes (Signed)
Subjective:  Got to EDW yesterday with last dialysis (EDW 67 - got to 67.3 kg) Desatted again - now on NRB CXR 1/6 with improving interstitial edema; chest CT with enlarging effusions causing compressive atelectasis Objective:    Vital signs in last 24 hours: Filed Vitals:   11/09/13 0043 11/09/13 0400 11/09/13 0741 11/09/13 1016  BP:  142/62 154/63 134/65  Pulse: 86 81 85 88  Temp:  97.8 F (36.6 C) 97.8 F (36.6 C)   TempSrc:  Oral Oral   Resp: 21 13 28    Height:      Weight:  67 kg (147 lb 11.3 oz)    SpO2: 94% 99% 97%    Weight change: -0.2 kg (-7.1 oz)  Intake/Output Summary (Last 24 hours) at 11/09/13 1028 Last data filed at 11/09/13 1000  Gross per 24 hour  Intake    243 ml  Output      0 ml  Net    243 ml    Physical Exam:  Blood pressure 134/65, pulse 88, temperature 97.8 F (36.6 C), temperature source Oral, resp. rate 28, height 5\' 5"  (1.651 m), weight 67 kg (147 lb 11.3 oz), SpO2 97.00%. Elderly pleasant female, NAD but on NRB mask No rash, cyanosis  Sclera anicteric, throat clear  Overall poor air movement with short exp wheeze left upper ant chest/ post crackles bases RRR S1S2 no S3 Abd obese, soft, nt/nd No LE or UE edema  Neuro nf, ox3, alert  Left upper chest tunneled HD cath in place without tenderness   Recent Labs Lab 11/07/13 0455 11/07/13 2042 11/08/13 0430  NA 138  --  136*  K 4.5  --  4.0  CL 97  --  92*  CO2 24  --  27  GLUCOSE 239*  --  86  BUN 55*  --  29*  CREATININE 3.54* 1.56* 2.29*  CALCIUM 8.9  --  8.9  PHOS  --  4.4 4.2    Recent Labs Lab 11/08/13 0430  ALBUMIN 3.6    Recent Labs Lab 11/07/13 0455 11/07/13 2042 11/08/13 0430  WBC 14.5* 12.6* 13.5*  NEUTROABS 12.5*  --   --   HGB 10.0* 10.3* 10.5*  HCT 32.0* 31.6* 32.8*  MCV 98.8 97.5 97.3  PLT 240 250 236   ABG (done post dialysis 1/5)    Component Value Date/Time   PHART 7.408 11/07/2013 1815   PCO2ART 37.8 11/07/2013 1815   PO2ART 57.1* 11/07/2013 1815    HCO3 23.4 11/07/2013 1815   TCO2 24.5 11/07/2013 1815   ACIDBASEDEF 0.7 11/07/2013 1815   O2SAT 89.7 11/07/2013 1815    Recent Labs Lab 11/08/13 0751 11/08/13 1208 11/08/13 1616 11/08/13 2058 11/09/13 0746  GLUCAP 70 115* 173* 164* 91   Studies/Results: Dg Chest 2 View  11/08/2013   CLINICAL DATA:  Weakness and hypoxia  EXAM: CHEST  2 VIEW  COMPARISON:  November 07, 2013.  FINDINGS: The lungs are well-expanded. The pulmonary interstitial markings are less prominent today. The small bilateral pleural effusions are less conspicuous in the hemidiaphragms are better demonstrated. The cardiopericardial silhouette is not enlarged. The pulmonary vascularity is not engorged. The dual lumen dialysis type catheter remains in place with the tip at the junction of the SVC with the right atrium.  IMPRESSION: There is improvement in the appearance of the pulmonary interstitium consistent with resolving interstitial edema. The pleural effusions appear smaller as well.   Electronically Signed   By: David  [place]  On: 11/08/2013 14:09   Ct Angio Chest Pe W/cm &/or Wo Cm  11/08/2013   CLINICAL DATA:  Chest pain. History of end-stage renal disease and hemodialysis.  EXAM: CT ANGIOGRAPHY CHEST WITH CONTRAST  TECHNIQUE: Multidetector CT imaging of the chest was performed using the standard protocol during bolus administration of intravenous contrast. Multiplanar CT image reconstructions including MIPs were obtained to evaluate the vascular anatomy.  CONTRAST:  OMNIPAQUE IOHEXOL 350 MG/ML SOLN  COMPARISON:  Radiographs today and 11/07/2013.  Chest CT 02/23/2012  FINDINGS: The pulmonary arteries remain well opacified with contrast. There is no evidence of acute pulmonary embolism.  Again demonstrated is extensive atherosclerosis of the aorta, great vessels and coronary arteries. Left IJ dialysis catheters extend into the right atrium. There is stenosis of the right brachiocephalic vein just superior to the SVC. Collateral  vessels are opacified within the upper right chest wall. The SVC is patent. The right cardiac chambers are dilated, and there is reflux of contrast into the IVC and hepatic veins.  There has been some improvement in the previously demonstrated prominent lymphoid tissue in the mediastinum and both hilar regions. No progressive adenopathy is demonstrated. There is no significant residual pericardial effusion.  Bilateral pleural effusions have enlarged, now moderate. There is associated subtotal collapse of both lower lobes. The upper and right middle lobes remain aerated with moderate emphysema.  The visualized upper abdomen has a stable appearance. There are no acute osseous findings.  Review of the MIP images confirms the above findings.  IMPRESSION: 1. No evidence of acute pulmonary embolism. 2. Extensive atherosclerosis. 3. 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 No acute DVT demonstrated. 4. 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 5. Enlarging moderate-sized bilateral pleural effusions with resulting subtotal collapse of both lower lobes. 6. Moderate underlying emphysema.   Electronically Signed   By: Roxy Horseman M.D.   On: 11/08/2013 17:45   Scheduled Medications:   . amLODipine  10 mg Oral Daily  . aspirin EC  81 mg Oral Daily  . budesonide  0.25 mg Nebulization BID  . calcium acetate  667 mg Oral TID WC  . darbepoetin  40 mcg Intravenous Q7 days  . doxercalciferol  2 mcg Intravenous Q M,W,F-HD  . doxycycline  100 mg Oral Q12H  . heparin  5,000 Units Subcutaneous Q8H  . insulin aspart  0-15 Units Subcutaneous TID WC  . insulin glargine  25 Units Subcutaneous QHS  . isosorbide mononitrate  120 mg Oral Daily  . levothyroxine  25 mcg Oral QAC breakfast  . metoprolol tartrate  12.5 mg Oral Daily  . pantoprazole  40 mg Oral Daily  . simvastatin  10 mg Oral QPM  . sodium chloride  3 mL Intravenous Q12H  . tiotropium   18 mcg Inhalation Daily    I  have reviewed scheduled and prn medications.  Dialysis: [facility] on MWF .  4h 67kg - need to lower F160 Bath 2K/2.5Ca Heparin none L IJ TDC BFR 400 UF profile 4  Hectorol 2 EPO 5000 Venofer 50/wk   ASSESSMENT/RECOMMENDATIONS  Dyspnea / underlying COPD /compounded by vol excess/bilateral effusions  Desatted again with incr O2 requirements, CT with enlarging effusions and compressive atelectasis s/p HD 1/5 and due for today Got to EDW last TMT.  No peripheral edema now but still some interstitial edema (better) on CXR and bilateral effusions (on CT) Agree dialysis not very effective at removing 3rd  spaced pleural fluid May need tap Will try to lower EDW with dialysis if BP will allow.    ESRD: HD MWF EDW 67 will try to lower given appearance of CT Anemia / CKD: Last Hb 10, cont esa  MBD / CKD: cont vit D, phoslo 1 ac, last pth 334  HTN/volume: cont norvasc/metoprolol, lower volume w HD  COPD  Multiple access failures: catheter-dependent   Camille Balynthia Sajad Glander, MD [facility] [phone] Pager 11/09/2013, 10:28 AM

## 2013-11-09 NOTE — Procedures (Signed)
I have personally attended this patient's dialysis session.  2K bath pending labs UF goal to 66 kg if BP will tolerate  [name] [name] B

## 2013-11-09 NOTE — Progress Notes (Addendum)
[facility] Progress Note [place] TEAM 1 - Stepdown/ICU TEAM   [name] [name] ZLD:[id number] DOB: 10-29-1941 DOA: 11/07/2013 PCP: Quentin Mulling, MD  Brief narrative: 73yo F with history of COPD on home oxygen 3 L and end-stage renal disease on hemodialysis scheduled Monday Wednesdays and Fridays who was recently admitted for an acute COPD exacerbation, (discharged on 12/29). She complete a seven-day course of doxycycline but did not tolerate the prescribed prednisone due to feeling jittery therefore, did not complete the course.  She presented to the ED with increasing dyspnea and increased O2 requirements. CXR was suggestive of pulmonary congestion and emergent dialysis was performed after admission.   Subjective: Patient is seen during dialysis.  She is resting comfortably.  Her saturations are in the mid to high 90s on a nonrebreather mask.  She is able to carry on a conversation without difficulty.  Assessment/Plan:  Acute respiratory failure with hypoxia (a) COPD exacerbation - currently on higher than usual amount of O2 (baseline is 2 -3 L) - repeat CXR does not reveal further pulmonary edema or pneumonia but does reveal b/l effusions - Attempt to wean oxygen after dialysis today  (b) Fluid overload in ESRD - she was dialyzed back to dry weight 1/5 and is still requiring high-level oxygen support - Nephrology to attempt to lower her dry weight today - CXR and physical exam reveal her to be euvolemic however CT chest reveals collapse of lower lobes due to progressive effusions - doubt that these effusions can be dialyzed off, but patient does appear to be slowly improving - Followup effusion size with chest x-ray again in the morning and if patient still requiring high-level oxygen support and effusions have not improved will consult Pulmonary for thoracentesis  Diabetes mellitus - on home dose of Lantus and sliding scale insulin - CBG stable  Hypothyroid - cont  Synthroid  HTN - cont home meds   GERD - cont Protonix   H/o DVT and PE - 2 yrs ago - CTA chest without evidence of PE  Code Status: FULL Family Communication: No family present at time of exam Disposition Plan: Continue to follow in step down unit  Consultants: Nephrology  Procedures: none  Antibiotics: Doxycycline 1/05 >>  DVT prophylaxis: SQ Heparin   Objective: Blood pressure 102/49, pulse 74, temperature 97.8 F (36.6 C), temperature source Oral, resp. rate 18, height 5\' 5"  (1.651 m), weight 69.8 kg (153 lb 14.1 oz), SpO2 98.00%.  Intake/Output Summary (Last 24 hours) at 11/09/13 1548 Last data filed at 11/09/13 1000  Gross per 24 hour  Intake    243 ml  Output      0 ml  Net    243 ml   Exam: General: Currently on nonrebreather but able to carry on a full conversation  Lungs: very poor air entry - mild diffuse expiratory wheezing Cardiovascular: Regular rate and rhythm without murmur gallop or rub normal S1 and S2 Abdomen: Nontender, nondistended, soft, bowel sounds positive, no rebound, no ascites, no appreciable mass Extremities: No significant cyanosis, clubbing, or edema bilateral lower extremities  Data Reviewed: Basic Metabolic Panel:  Recent Labs Lab 11/07/13 0455 11/07/13 2042 11/08/13 0430 11/09/13 1252  NA 138  --  136* 133*  K 4.5  --  4.0 3.9  CL 97  --  92* 89*  CO2 24  --  27 28  GLUCOSE 239*  --  86 139*  BUN 55*  --  29* 65*  CREATININE 3.54* 1.56* 2.29* 3.70*  CALCIUM 8.9  --  8.9 8.3*  MG  --  1.9  --   --   PHOS  --  4.4 4.2 4.3   Liver Function Tests:  Recent Labs Lab 11/08/13 0430 11/09/13 1252  ALBUMIN 3.6 3.1*   CBC:  Recent Labs Lab 11/07/13 0455 11/07/13 2042 11/08/13 0430 11/09/13 1244  WBC 14.5* 12.6* 13.5* 13.5*  NEUTROABS 12.5*  --   --   --   HGB 10.0* 10.3* 10.5* 10.2*  HCT 32.0* 31.6* 32.8* 30.5*  MCV 98.8 97.5 97.3 95.0  PLT 240 250 236 258   BNP (last 3 results)  Recent Labs   10/26/13 0510 11/07/13 0455  PROBNP 9552.0* 22434.0*   CBG:  Recent Labs Lab 11/08/13 1208 11/08/13 1616 11/08/13 2058 11/09/13 0746 11/09/13 1135  GLUCAP 115* 173* 164* 91 155*    Studies:  Recent x-ray studies have been reviewed in detail by the Attending Physician  Scheduled Meds:  Scheduled Meds: . amLODipine  10 mg Oral Daily  . aspirin EC  81 mg Oral Daily  . budesonide  0.25 mg Nebulization BID  . calcium acetate  667 mg Oral TID WC  . darbepoetin  40 mcg Intravenous Q7 days  . doxercalciferol  2 mcg Intravenous Q M,W,F-HD  . doxycycline  100 mg Oral Q12H  . heparin  5,000 Units Subcutaneous Q8H  . insulin aspart  0-15 Units Subcutaneous TID WC  . insulin glargine  25 Units Subcutaneous QHS  . isosorbide mononitrate  120 mg Oral Daily  . levothyroxine  25 mcg Oral QAC breakfast  . metoprolol tartrate  12.5 mg Oral Daily  . pantoprazole  40 mg Oral Daily  . simvastatin  10 mg Oral QPM  . sodium chloride  3 mL Intravenous Q12H  . tiotropium  18 mcg Inhalation Daily    Time spent on care of this patient: 35mins   Lonia BloodMCCLUNG,Kaito Schulenburg T, MD  [facility] Office  [phone] Pager - Text Page per Loretha StaplerAmion as per below:  On-Call/Text Page:      [email or web address]      password [email or web address]  If 7PM-7AM, please contact night-coverage [email or web address] Password [email or web address] 11/09/2013, 3:48 PM   LOS: 2 days

## 2013-11-09 NOTE — Progress Notes (Signed)
Only able to get to 67.5 kg with HD d/t BP (old EDW 67 and we were trying to lower volume below 67) Stop amlodipine. Continue low dose BB and reeval in AM.

## 2013-11-09 NOTE — Progress Notes (Signed)
Pt. Had difficulty maintaining O2 sats. O2 saturation at 84% on  at 5L. Placed on venturi mask at 50% O2 sats at 86%. Gave PRN nebulizer O2 sats at 88%. Called RT and placed pt. On a Nonrebreather. O2 sats at 96%. Will continue to monitor and attempt to wean

## 2013-11-09 NOTE — Progress Notes (Signed)
Pt maintaining O2 sats >95% on NRB.  In an attempt to wean, patient was switched to the Venturi mask (15L at 55%O2).  Within 5 minutes, patient's sats dropped as low as 78%.  Patient was immediately placed back on NRB and is currently maintaining O2 sats > 93%.

## 2013-11-09 NOTE — Evaluation (Signed)
Physical Therapy Evaluation Patient Details Name: [name] [name] MRN: [id number] DOB: 1940-11-24 Today's Date: 11/09/2013 Time: 1022-1037 PT Time Calculation (min): 15 min  PT Assessment / Plan / Recommendation History of Present Illness  Pt admit with acute respiratory failure.    Clinical Impression  Pt admitted with above. Pt currently with functional limitations due to the deficits listed below (see PT Problem List).  Pt will benefit from skilled PT to increase their independence and safety with mobility to allow discharge to the venue listed below.     PT Assessment  Patient needs continued PT services    Follow Up Recommendations  Home health PT;Supervision - Intermittent (May need short term NHP stay)          Barriers to Discharge Decreased caregiver support      Equipment Recommendations  Other (comment) (4 wheeled RW)         Frequency Min 3X/week    Precautions / Restrictions Precautions Precautions: Fall Restrictions Weight Bearing Restrictions: No   Pertinent Vitals/Pain Pt on nonrebreather mask with sats down to 84% with transfer to chair.  Recovered within 1 min to 94%.  Other VSS, no pain.      Mobility  Bed Mobility Overal bed mobility: + 2 for safety/equipment Bed Mobility: Supine to Sit Supine to sit: Min guard Transfers Overall transfer level: Needs assistance Equipment used: None Transfers: Stand Pivot Transfers;Sit to/from Stand Sit to Stand: Min guard Stand pivot transfers: Min guard General transfer comment: Steadying assist needed.         PT Diagnosis: Generalized weakness  PT Problem List: Decreased activity tolerance;Decreased balance;Decreased mobility;Decreased knowledge of use of DME;Decreased safety awareness;Decreased knowledge of precautions PT Treatment Interventions: DME instruction;Gait training;Functional mobility training;Therapeutic activities;Therapeutic exercise;Balance training;Patient/family education     PT  Goals(Current goals can be found in the care plan section) Acute Rehab PT Goals Patient Stated Goal: to go home PT Goal Formulation: With patient Time For Goal Achievement: 11/16/13 Potential to Achieve Goals: Good  Visit Information  Last PT Received On: 11/09/13 Assistance Needed: +1 History of Present Illness: Pt admit with acute respiratory failure.         Prior Functioning  Home Living Family/patient expects to be discharged to:: Private residence Living Arrangements: Children Available Help at Discharge: Family;Available PRN/intermittently;Other (Comment) (son works 12 hour days) Type of Home: House Home Access: Ramped entrance Home Layout: One level Home Equipment: Bedside commode;Shower seat;Cane - single point (3 LO2) Additional Comments: Has dialysis M,W,F Prior Function Level of Independence: Independent Comments: JRs transportation takes pt to dialysis MWF Communication Communication: No difficulties Dominant Hand: Right    Cognition  Cognition Arousal/Alertness: Awake/alert Behavior During Therapy: WFL for tasks assessed/performed Overall Cognitive Status: Within Functional Limits for tasks assessed    Extremity/Trunk Assessment Upper Extremity Assessment Upper Extremity Assessment: Defer to OT evaluation Lower Extremity Assessment Lower Extremity Assessment: Generalized weakness Cervical / Trunk Assessment Cervical / Trunk Assessment: Normal   Balance Balance Overall balance assessment: No apparent balance deficits (not formally assessed)  End of Session PT - End of Session Equipment Utilized During Treatment: Gait belt;Oxygen Activity Tolerance: Patient limited by fatigue Patient left: in chair;with call bell/phone within reach Nurse Communication: Mobility status       [name],[name] Harned 11/09/2013, 1:37 PM [facility] [phone] (pager)

## 2013-11-10 ENCOUNTER — Inpatient Hospital Stay (HOSPITAL_COMMUNITY): Payer: Medicare Other

## 2013-11-10 DIAGNOSIS — I059 Rheumatic mitral valve disease, unspecified: Secondary | ICD-10-CM

## 2013-11-10 DIAGNOSIS — J90 Pleural effusion, not elsewhere classified: Secondary | ICD-10-CM

## 2013-11-10 LAB — GLUCOSE, CAPILLARY
GLUCOSE-CAPILLARY: 123 mg/dL — AB (ref 70–99)
GLUCOSE-CAPILLARY: 138 mg/dL — AB (ref 70–99)
GLUCOSE-CAPILLARY: 223 mg/dL — AB (ref 70–99)
Glucose-Capillary: 260 mg/dL — ABNORMAL HIGH (ref 70–99)

## 2013-11-10 LAB — BLOOD GAS, ARTERIAL
Acid-Base Excess: 2.1 mmol/L — ABNORMAL HIGH (ref 0.0–2.0)
Bicarbonate: 26.8 mEq/L — ABNORMAL HIGH (ref 20.0–24.0)
Drawn by: 35135
FIO2: 1 %
O2 Saturation: 87.3 %
Patient temperature: 98.6
TCO2: 28.3 mmol/L (ref 0–100)
pCO2 arterial: 47.3 mmHg — ABNORMAL HIGH (ref 35.0–45.0)
pH, Arterial: 7.372 (ref 7.350–7.450)
pO2, Arterial: 55 mmHg — ABNORMAL LOW (ref 80.0–100.0)

## 2013-11-10 LAB — BODY FLUID CELL COUNT WITH DIFFERENTIAL
LYMPHS FL: 67 %
Monocyte-Macrophage-Serous Fluid: 10 % — ABNORMAL LOW (ref 50–90)
Neutrophil Count, Fluid: 23 % (ref 0–25)
Total Nucleated Cell Count, Fluid: 352 cu mm (ref 0–1000)

## 2013-11-10 LAB — PROTEIN, BODY FLUID: TOTAL PROTEIN, FLUID: 1.7 g/dL

## 2013-11-10 LAB — LACTATE DEHYDROGENASE: LDH: 308 U/L — AB (ref 94–250)

## 2013-11-10 LAB — CHOLESTEROL, BODY FLUID: Cholesterol, Fluid: 22 mg/dL

## 2013-11-10 LAB — CHOLESTEROL, TOTAL: CHOLESTEROL: 208 mg/dL — AB (ref 0–200)

## 2013-11-10 LAB — PROTEIN, TOTAL: Total Protein: 6.6 g/dL (ref 6.0–8.3)

## 2013-11-10 LAB — LACTATE DEHYDROGENASE, PLEURAL OR PERITONEAL FLUID: LD, Fluid: 61 U/L — ABNORMAL HIGH (ref 3–23)

## 2013-11-10 NOTE — Procedures (Signed)
Thoracentesis Procedure Note  Pre-operative Diagnosis: pleural effusion  Post-operative Diagnosis: same  Indications: dyspnea and analysis   Procedure Details  Consent: Informed consent was obtained. Risks of the procedure were discussed including: infection, bleeding, pain, pneumothorax. Real time [place] was used to ID the pleural space.  Under sterile conditions the patient was positioned. Betadine solution and sterile drapes were utilized.  1% buffered lidocaine was used to anesthetize the 7 rib space. Fluid was obtained without any difficulties and minimal blood loss.  A dressing was applied to the wound and wound care instructions were provided.   Findings 900 ml of clear pleural fluid was obtained. A sample was sent to Pathology for cytogenetics, flow, and cell counts, as well as for infection analysis.  Complications:  None; patient tolerated the procedure well.          Condition: stable  Plan A follow up chest x-ray was ordered. Bed Rest for 0 hours. Tylenol 650 mg. for pain.    Attending: I supervised the procedure Yolonda Kida PCCM Pager: [phone] If no response, call [phone]

## 2013-11-10 NOTE — Consult Note (Signed)
Name: [name] [name] MRN: [id number] DOB: 11/07/1940    ADMISSION DATE:  11/07/2013 CONSULTATION DATE: 1/8  REFERRING MD :  Butler Denmarkizwan [facility]IMARY SERVICE:  [facility]  CHIEF COMPLAINT:  Hypoxic respiratory failure and volume overload/effusions   BRIEF PATIENT DESCRIPTION:  73 year old female w/ h/o chronic resp failure, possible COPD (no PFTs) and ESRD. Recently d/c 12/29 after treatment for what was felt to be AECOPD. Re-admitted on 1/5 with acute on chronic resp failure in setting of volume overload. Treated w/ empiric abx and volume removal via HD. Volume removal was limited by hypotension on 1/7. PCCM asked to see on 1/8 in setting of persistent high FIO2 needs, and worsening edema/effusions.   SIGNIFICANT EVENTS / STUDIES:  1/7: Hypotension limiting dialysis. EDW 67kg, goal was to get <67Kg, only able to get to 67.5kg.  ECHO 05/2013: EF 55-60%, gd I DD.  Site: left Date: 1/8  Pleural fluid Serum   LDH: LDH:  Protein: Protein:  Cholesterol:  Cholesterol:  Afb:   Gm stain:    Culture:   Fungal:    Hct:    Color:    Wbc:   Neutrophils:    Lymph:    Ph:    Rheumatoid factor:   Adenosine Deaminase:   Glucose:    Cytology:       Special notes:      LINES / TUBES:   CULTURES:   ANTIBIOTICS:doxy 1/5>>>  HISTORY OF PRESENT ILLNESS:   73 y.o. female With chronic respiratory failure on 3 L O2, h/o COPD (no PFTs), and end-stage renal disease on hemodialysis scheduled Monday Wednesdays and Fridays. Was recently admitted for COPD exacerbation at the end of last month and complete a seven-day course of doxycycline. Patient was also sent home 12/29 on prednisone which she took initially but reports discontinuing because she felt jittery at night. Presented to the ED 1/5 w/ cc: of about 1 wk h/o  increasing SOB requiring increased oxygen supplementation. Nothing she is aware of made it better or worse. She denies any hemoptysis, fevers, productive cough, or sick contacts, some wheezing over last  2d prior to admit.  Nothing she is aware of made it better. Activity made it worse. Was admitted to the IM service. Initial CXR showed volume overload w/ what appeared to be a mix of edema and effusion, plus atx. Treatment has included: continuation of Doxy, scheduled BDs and volume removal via HD and high flow 02. Her initial CXR on 1/6 did appear a little better, got CT chest to r/o PE. This was negative for PE, but did show bibasilar moderate sized effusions w/ bibasilar atx and some element of edema. On 1/7 was breathing more comfortably but still requiring high flow oxygen, the nephrology service was aiming to decrease dry weight goal to <67 Kg (67kg was old EDW), but was only able to get to 67.5kg due to hypotension. In the early am hours 1/8 had increased lethargy, persistent hypoxia on 100% NRB and CXR was c/w worsening edema and effusions. Because of BP limiting ability to remove volume via HD PCCM was called to see if drainage of effusions might be of clinical benefit and to add additional recs re: persistent hypoxia.    PAST MEDICAL HISTORY :  Past Medical History  Diagnosis Date  . Diabetes mellitus   . Hypercholesterolemia   . Hypothyroidism   . Colitis   . Coronary artery disease   . Hypertension   . GERD (gastroesophageal reflux disease)   .  Myocardial infarction   . Peripheral vascular disease   . Pneumonia   . Angina   . H/O Clostridium difficile infection   . Hx of pulmonary embolus   . COPD (chronic obstructive pulmonary disease)   . Shortness of breath     uses O2 as needed  . Chronic kidney disease     M-W-F   [place]  . Critical lower limb ischemia   . Claudication    Past Surgical History  Procedure Laterality Date  . Tubal ligation    . Amputaton of right fifth toe    . Tonsillectomy    . Insertion of dialysis catheter    . Av fistula placement  [id number]    right RUE AVF  . Bvt  May 18, 2012    First stage, Basilic Vein Transposition  . Bvt  07/27/12     Left arm Basilic Vein Transposition  . Arch aortogram  08/19/2012    Procedure: ARCH AORTOGRAM;  Surgeon: Fransisco Hertz, MD;  Location: [facility] OR;  Service: Vascular;  Laterality: N/A;  . Insertion of dialysis catheter  08/21/2012    Procedure: INSERTION OF DIALYSIS CATHETER;  Surgeon: Sherren Kerns, MD;  Location[facility] OR;  Service: Vascular;  Laterality: Left;  internal jugular   Prior to Admission medications   Medication Sig Start Date End Date Taking? Authorizing Provider  albuterol (PROVENTIL) (5 MG/ML) 0.5% nebulizer solution Take 0.5 mLs (2.5 mg total) by nebulization 3 (three) times daily. 10/31/13  Yes Jerald Kief, MD  amLODipine (NORVASC) 10 MG tablet Take 10 mg by mouth daily.  04/20/12  Yes Historical Provider, MD  aspirin EC 81 MG tablet Take 81 mg by mouth daily.   Yes Historical Provider, MD  budesonide (PULMICORT) 0.25 MG/2ML nebulizer solution Take 2 mLs (0.25 mg total) by nebulization 2 (two) times daily. 10/31/13  Yes Jerald Kief, MD  calcium acetate (PHOSLO) 667 MG capsule Take 667 mg by mouth 3 (three) times daily with meals.   Yes Historical Provider, MD  doxycycline (VIBRA-TABS) 100 MG tablet Take 1 tablet (100 mg total) by mouth every 12 (twelve) hours. 10/31/13  Yes Jerald Kief, MD  insulin glargine (LANTUS) 100 UNIT/ML injection Inject 25 Units into the skin at bedtime.  11/01/11  Yes Clydia Llano, MD  isosorbide mononitrate (IMDUR) 120 MG 24 hr tablet Take 120 mg by mouth daily.    Yes Historical Provider, MD  levothyroxine (SYNTHROID, LEVOTHROID) 25 MCG tablet Take 25 mcg by mouth daily.    Yes Historical Provider, MD  metoprolol tartrate (LOPRESSOR) 25 MG tablet Take 12.5 mg by mouth daily.   Yes Historical Provider, MD  nitroGLYCERIN (NITROSTAT) 0.4 MG SL tablet Place 0.4 mg under the tongue every 5 (five) minutes as needed. For chest pain.   Yes Historical Provider, MD  NOVOLIN R RELION 100 UNIT/ML injection Inject 6-10 Units into the skin 3 (three) times daily.  Sliding scale 10/15/13  Yes Historical Provider, MD  pantoprazole (PROTONIX) 40 MG tablet Take 40 mg by mouth daily.    Yes Historical Provider, MD  predniSONE (DELTASONE) 20 MG tablet Take 3 tablets (60 mg total) by mouth daily with breakfast. 10/31/13  Yes Jerald Kief, MD  Probiotic Product (PROBIOTIC PO) Take 1 capsule by mouth daily as needed (digestion).    Yes Historical Provider, MD  simvastatin (ZOCOR) 10 MG tablet Take 10 mg by mouth every evening.   Yes Historical Provider, MD   Allergies  Allergen Reactions  .  Procaine Hcl Nausea And Vomiting and Other (See Comments)    "Pass out"    FAMILY HISTORY:  Family History  Problem Relation Age of Onset  . Diabetes type II    . Hypertension    . Diabetes Mother   . Kidney disease Mother   . Diabetes Father   . Anesthesia problems Neg Hx    SOCIAL HISTORY:  reports that she quit smoking about 2 years ago. Her smoking use included Cigarettes. She has a 40 pack-year smoking history. She has never used smokeless tobacco. She reports that she does not drink alcohol or use illicit drugs.  Review of Systems:   Bolds are positive  Constitutional: weight loss, gain, night sweats, Fevers, chills, fatigue .  HEENT: headaches, Sore throat, sneezing, nasal congestion, post nasal drip, Difficulty swallowing, Tooth/dental problems, visual complaints visual changes, ear ache CV:  chest pain, radiates: ,Orthopnea, PND, swelling in lower extremities, dizziness, palpitations, syncope.  GI  heartburn, indigestion, abdominal pain, nausea, vomiting, diarrhea, change in bowel habits, loss of appetite, bloody stools.  Resp: cough, productive: , hemoptysis, dyspnea, progressive over the 5 d prior to admit, felt she did get some short term relief w/ rescue SABA,  chest pain, pleuritic, + wheeze w/ exertion, noted the 2 d prior to admit. None on exam 1/8 Skin: rash or itching or icterus GU: dysuria, change in color of urine, urgency or frequency. flank  pain, hematuria  MS: joint pain or swelling. decreased range of motion  Psych: change in mood or affect. depression or anxiety.  Neuro: difficulty with speech, weakness, numbness, ataxia   SUBJECTIVE:  Feels a little better, but still fatigued.  VITAL SIGNS: Temp:  [97.2 F (36.2 C)-98.1 F (36.7 C)] 98 F (36.7 C) (01/08 0811) Pulse Rate:  [71-88] 87 (01/08 0811) Resp:  [14-20] 17 (01/08 0811) BP: (74-134)/(36-65) 134/54 mmHg (01/08 0811) SpO2:  [61 %-99 %] 61 % (01/08 0811) FiO2 (%):  [100 %] 100 % (01/08 0811) Weight:  [67.5 kg (148 lb 13 oz)-69.8 kg (153 lb 14.1 oz)] 68.5 kg (151 lb 0.2 oz) (01/08 0500)  PHYSICAL EXAMINATION: General:  Chronically ill appearing white female, up in chair, not in acute distress but O2 sats do decrease substantially when high flow oxygen removed.  Neuro:  Awake, alert, no focal def  HEENT:  , + JVD Cardiovascular:  rrr Lungs:  No sig wheeze, decreased in bases  Abdomen:  Non-tender, + bowel sounds, tol diet  Musculoskeletal:  Intact  Skin:  Intact    Recent Labs Lab 11/07/13 0455 11/07/13 2042 11/08/13 0430 11/09/13 1252  NA 138  --  136* 133*  K 4.5  --  4.0 3.9  CL 97  --  92* 89*  CO2 24  --  27 28  BUN 55*  --  29* 65*  CREATININE 3.54* 1.56* 2.29* 3.70*  GLUCOSE 239*  --  86 139*    Recent Labs Lab 11/07/13 2042 11/08/13 0430 11/09/13 1244  HGB 10.3* 10.5* 10.2*  HCT 31.6* 32.8* 30.5*  WBC 12.6* 13.5* 13.5*  PLT 250 236 258   PCXR:  Increased edema/effusions bilaterally  Bedside US 1/8: see pictures in chart: mod-lg free flowing left effusion (pre-thora). Right effusion: sm->mod. Free flowing w/ atx lung visible. Doubt role for thoracentesis on right. (see pictures in chart) -> PB   ASSESSMENT / PLAN: Acute on chronic respiratory failure in the setting of volume overload w/ both pulmonary edema and bilateral Left > right effusions (verified  via [place]), also element of bilateral atx in setting of ESRD. IF she does  indeed have COPD, this could be a contributing factor but her current HPI and clinical exam is NOT consistent with AECOPD.  >hypotension has limited ability to remove volume and get to EDW goal.  >she is requiring High FIO2. Draining effusions may help some (but doubt much),  focus on volume removal is still going to be imperative.  >CT angio was neg for PE  Plan/rec -drain left effusion, think that risk outweighs benefit of draining right as looks like about  600 ml at most which would be unlikely to impact hypoxia significantly  -agree w/ holding antihypertensives in order to achieve lower EDW, think that this will have greatest impact on her hypoxia -Obtain ECHO, screen for secondary PAH and r/o shunt. Also r/o heart failure  -continue current BD regimen, no need to add systemic steroids -will defer abx to primary service, but think these could be stopped.  -add pulm hygiene measures: Flutter and IS   Pulmonary and Critical Care Medicine [facility] Pager: [phone]  11/10/2013, 8:31 AM  Attending:  I have seen and examined the patient with nurse practitioner/resident and agree with the note above.   Pulmonary edema is the primary cause of hypoxemia.  We were able to provide some relief in work of breathing by draining 900cc from the left, but this rarely improves hypoxemia.  Would push volume removal as much as possible.  Yolonda Kida PCCM Pager: [phone] Cell: [phone] If no response, call [phone]

## 2013-11-10 NOTE — Progress Notes (Signed)
[facility] Progress Note [place] TEAM 1 - Stepdown/ICU TEAM   [name] [name] TGY:[id number] DOB: 07/12/1941 DOA: 11/07/2013 PCP: Garnette Czech, MD  Brief narrative: 73yo F with history of COPD on home oxygen 3 L and end-stage renal disease on hemodialysis scheduled Monday Wednesdays and Fridays who was recently admitted for an acute COPD exacerbation, (discharged on 12/29). She complete a seven-day course of doxycycline but did not tolerate the prescribed prednisone due to feeling jittery therefore, did not complete the course.  She presented to the ED with increasing dyspnea and increased O2 requirements. CXR was suggestive of pulmonary congestion and emergent dialysis was performed after admission.   Subjective: Patient on high flow O2- feels short of breath- no cough.   Assessment/Plan:  Acute respiratory failure with hypoxia (a) COPD exacerbation - now stable - currently on higher than usual amount of O2 (baseline is 2 -3 L)  (b) Fluid overload in ESRD - Nephrology to attempt to lower her dry weight on 1/7 but met with hypotension which limited fluid removal - CXR now revealing increased pulm edema and continued effusions - patient now on non-rebreather - have consulted PCCM for thoracentesis - Nephrology to attempt dialysis again today  Diabetes mellitus - on home dose of Lantus and sliding scale insulin - CBG stable  Hypothyroid - cont Synthroid  HTN - cont home meds   GERD - cont Protonix   H/o DVT and PE - 2 yrs ago - CTA chest without evidence of PE  Code Status: FULL Family Communication: No family present at time of exam Disposition Plan: Continue to follow in step down unit  Consultants: Nephrology  Procedures: none  Antibiotics: Doxycycline 1/05 >>  DVT prophylaxis: SQ Heparin   Objective: Blood pressure 130/52, pulse 84, temperature 98 F (36.7 C), temperature source Oral, resp. rate 15, height 5' 5"  (1.651 m), weight 68.5 kg (151 lb  0.2 oz), SpO2 100.00%.  Intake/Output Summary (Last 24 hours) at 11/10/13 1429 Last data filed at 11/10/13 1000  Gross per 24 hour  Intake    130 ml  Output   1600 ml  Net  -1470 ml   Exam: General: Currently on nonrebreather but able to carry on a full conversation  Lungs: very poor air entry - crackles up to mid-lungs Cardiovascular: Regular rate and rhythm without murmur gallop or rub normal S1 and S2 Abdomen: Nontender, nondistended, soft, bowel sounds positive, no rebound, no ascites, no appreciable mass Extremities: No significant cyanosis, clubbing, or edema bilateral lower extremities  Data Reviewed: Basic Metabolic Panel:  Recent Labs Lab 11/07/13 0455 11/07/13 2042 11/08/13 0430 11/09/13 1252  NA 138  --  136* 133*  K 4.5  --  4.0 3.9  CL 97  --  92* 89*  CO2 24  --  27 28  GLUCOSE 239*  --  86 139*  BUN 55*  --  29* 65*  CREATININE 3.54* 1.56* 2.29* 3.70*  CALCIUM 8.9  --  8.9 8.3*  MG  --  1.9  --   --   PHOS  --  4.4 4.2 4.3   Liver Function Tests:  Recent Labs Lab 11/08/13 0430 11/09/13 1252 11/10/13 1115  PROT  --   --  6.6  ALBUMIN 3.6 3.1*  --    CBC:  Recent Labs Lab 11/07/13 0455 11/07/13 2042 11/08/13 0430 11/09/13 1244  WBC 14.5* 12.6* 13.5* 13.5*  NEUTROABS 12.5*  --   --   --   HGB 10.0* 10.3*  10.5* 10.2*  HCT 32.0* 31.6* 32.8* 30.5*  MCV 98.8 97.5 97.3 95.0  PLT 240 250 236 258   BNP (last 3 results)  Recent Labs  10/26/13 0510 11/07/13 0455  PROBNP 9552.0* 22434.0*   CBG:  Recent Labs Lab 11/09/13 1135 11/09/13 1709 11/09/13 2226 11/10/13 0816 11/10/13 1140  GLUCAP 155* 109* 170* 123* 138*    Studies:  Recent x-ray studies have been reviewed in detail by the Attending Physician  Scheduled Meds:  Scheduled Meds: . aspirin EC  81 mg Oral Daily  . budesonide  0.25 mg Nebulization BID  . calcium acetate  667 mg Oral TID WC  . darbepoetin  40 mcg Intravenous Q7 days  . doxercalciferol  2 mcg Intravenous Q  M,W,F-HD  . doxycycline  100 mg Oral Q12H  . heparin  5,000 Units Subcutaneous Q8H  . insulin aspart  0-15 Units Subcutaneous TID WC  . insulin glargine  25 Units Subcutaneous QHS  . isosorbide mononitrate  120 mg Oral Daily  . levalbuterol  0.63 mg Nebulization QID  . levothyroxine  25 mcg Oral QAC breakfast  . metoprolol tartrate  12.5 mg Oral Daily  . pantoprazole  40 mg Oral Daily  . simvastatin  10 mg Oral QPM  . sodium chloride  3 mL Intravenous Q12H  . tiotropium  18 mcg Inhalation Daily    Time spent on care of this patient: 84mns   RDebbe Odea MD  [facility] Office  [phone] - Text Page per AShea Evansas per below:  On-Call/Text Page:      [email or web address]      password [email or web address]  If 7PM-7AM, please contact night-coverage [email or web address] Password [email or web address] 11/10/2013, 2:29 PM   LOS: 3 days

## 2013-11-10 NOTE — Progress Notes (Signed)
Attempting to wean patient off NRB.  Pt placed on venturi mask, 15L/55% O2.  Patient is maintaining O2 sats at 91-97% at this time.  Will continue to monitor and evaluate the need for additional O2 supplementation. Judeth Cornfield, RN

## 2013-11-10 NOTE — Progress Notes (Addendum)
Attempt to continue weaning patient down on O2 supplement, Venturi mask O2 rate reduced to 10 L @ 45%.  Patient is currently maintaining O2 sats at 91-96%.  Will continue to monitor and re-evaluate need for O2 supplementation.

## 2013-11-10 NOTE — Progress Notes (Signed)
  Echocardiogram 2D Echocardiogram has been performed.  [name] [name] 11/10/2013, 3:24 PM

## 2013-11-10 NOTE — Progress Notes (Signed)
Zenia Resides, NP performing thoracentesis as consented by patient at this time.   0950 procedure begun. 900 mL of clear, yellow fluid returned.  Specimens taken and will be sent to pathology for analysis. 0957 procedure stopped. Bandaid applied for dressing over puncture site.

## 2013-11-10 NOTE — Progress Notes (Addendum)
Paged MD to inform pt. DBP dropped below 60 with a MAP 56. Neuro status: pt. Is becoming more difficult to arouse. When pt.becomes alert she states "im tired from dialysis." will continue to monitor  MD called back. Ordered Stat ABG, may need to place pt. On BiPap. Will continue to monitor  Paged MD about pt. PO2 55. Pt. More alert. BP 115/46 MAP of 64. SPO2 91%. Waiting for MD to Call back. Will continue to monitor.

## 2013-11-10 NOTE — Progress Notes (Signed)
Subjective:  Dialysis yesterday; weight to 67.5 (EDW 67 and goal wass 66 d/t vol issues) Low BP's yest PM - amlodipine stopped More lethargic and around midnight hypoxemic on NRB with paO2 55  Objective:    Vital signs in last 24 hours: Filed Vitals:   11/10/13 0014 11/10/13 0400 11/10/13 0500 11/10/13 0811  BP: 107/36 120/54  134/54  Pulse: 76 77  87  Temp:  97.7 F (36.5 C)  98 F (36.7 C)  TempSrc:  Oral  Oral  Resp: 14 17  17   Height:      Weight:   68.5 kg (151 lb 0.2 oz)   SpO2: 95% 94%  61%   Weight change: 2.8 kg (6 lb 2.8 oz)  Intake/Output Summary (Last 24 hours) at 11/10/13 0819 Last data filed at 11/10/13 0600  Gross per 24 hour  Intake    363 ml  Output   1600 ml  Net  -1237 ml    Physical Exam:  Blood pressure 134/54, pulse 87, temperature 98 F (36.7 C), temperature source Oral, resp. rate 17, height 5\' 5"  (1.651 m), weight 68.5 kg (151 lb 0.2 oz), SpO2 61.00%. Elderly pleasant female, NAD but on NRB mask No rash, cyanosis  Sclera anicteric, throat clear  Overall poor air movement; very diminished BS at both bases with crackles superior to areas of dullness RRR S1S2 no S3 Abd obese, soft, nt/nd No LE or UE edema  Neuro nf, ox3, alert  Left upper chest tunneled HD cath in place without tenderness   Recent Labs Lab 11/07/13 0455 11/07/13 2042 11/08/13 0430 11/09/13 1252  NA 138  --  136* 133*  K 4.5  --  4.0 3.9  CL 97  --  92* 89*  CO2 24  --  27 28  GLUCOSE 239*  --  86 139*  BUN 55*  --  29* 65*  CREATININE 3.54* 1.56* 2.29* 3.70*  CALCIUM 8.9  --  8.9 8.3*  PHOS  --  4.4 4.2 4.3    Recent Labs Lab 11/08/13 0430 11/09/13 1252  ALBUMIN 3.6 3.1*    Recent Labs Lab 11/07/13 0455 11/07/13 2042 11/08/13 0430 11/09/13 1244  WBC 14.5* 12.6* 13.5* 13.5*  NEUTROABS 12.5*  --   --   --   HGB 10.0* 10.3* 10.5* 10.2*  HCT 32.0* 31.6* 32.8* 30.5*  MCV 98.8 97.5 97.3 95.0  PLT 240 250 236 258   ABG (done post dialysis 1/5)     Component Value Date/Time   PHART 7.372 11/10/2013 0041   PCO2ART 47.3* 11/10/2013 0041   PO2ART 55.0* 11/10/2013 0041   HCO3 26.8* 11/10/2013 0041   TCO2 28.3 11/10/2013 0041   ACIDBASEDEF 0.7 11/07/2013 1815   O2SAT 87.3 11/10/2013 0041    Recent Labs Lab 11/08/13 2058 11/09/13 0746 11/09/13 1135 11/09/13 1709 11/09/13 2226  GLUCAP 164* 91 155* 109* 170*   Studies/Results: Dg Chest 2 View  11/08/2013   CLINICAL DATA:  Weakness and hypoxia  EXAM: CHEST  2 VIEW  COMPARISON:  November 07, 2013.  FINDINGS: The lungs are well-expanded. The pulmonary interstitial markings are less prominent today. The small bilateral pleural effusions are less conspicuous in the hemidiaphragms are better demonstrated. The cardiopericardial silhouette is not enlarged. The pulmonary vascularity is not engorged. The dual lumen dialysis type catheter remains in place with the tip at the junction of the SVC with the right atrium.  IMPRESSION: There is improvement in the appearance of the pulmonary interstitium consistent with  resolving interstitial edema. The pleural effusions appear smaller as well.   Electronically Signed   By: David  [place]   On: 11/08/2013 14:09   Ct Angio Chest Pe W/cm &/or Wo Cm  11/08/2013   CLINICAL DATA:  Chest pain. History of end-stage renal disease and hemodialysis.  EXAM: CT ANGIOGRAPHY CHEST WITH CONTRAST  TECHNIQUE: Multidetector CT imaging of the chest was performed using the standard protocol during bolus administration of intravenous contrast. Multiplanar CT image reconstructions including MIPs were obtained to evaluate the vascular anatomy.  CONTRAST:  OMNIPAQUE IOHEXOL 350 MG/ML SOLN  COMPARISON:  Radiographs today and 11/07/2013.  Chest CT 02/23/2012  FINDINGS: The pulmonary arteries remain well opacified with contrast. There is no evidence of acute pulmonary embolism.  Again demonstrated is extensive atherosclerosis of the aorta, great vessels and coronary arteries. Left IJ dialysis  catheters extend into the right atrium. There is stenosis of the right brachiocephalic vein just superior to the SVC. Collateral vessels are opacified within the upper right chest wall. The SVC is patent. The right cardiac chambers are dilated, and there is reflux of contrast into the IVC and hepatic veins.  There has been some improvement in the previously demonstrated prominent lymphoid tissue in the mediastinum and both hilar regions. No progressive adenopathy is demonstrated. There is no significant residual pericardial effusion.  Bilateral pleural effusions have enlarged, now moderate. There is associated subtotal collapse of both lower lobes. The upper and right middle lobes remain aerated with moderate emphysema.  The visualized upper abdomen has a stable appearance. There are no acute osseous findings.  Review of the MIP images confirms the above findings.  IMPRESSION: 1. No evidence of acute pulmonary embolism. 2. Extensive atherosclerosis. 3. 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 No acute DVT demonstrated. 4. 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 5. Enlarging moderate-sized bilateral pleural effusions with resulting subtotal collapse of both lower lobes. 6. Moderate underlying emphysema.   Electronically Signed   By: Roxy Horseman M.D.   On: 11/08/2013 17:45   Dg Chest Port 1 View  11/10/2013   CLINICAL DATA:  Dyspnea and effusions  EXAM: PORTABLE CHEST - 1 VIEW  COMPARISON:  November 08, 2013  FINDINGS: The lungs are reasonably well inflated. Increased density in the mid and lower lung zones has progressed. There are bilateral pleural effusions layering posteriorly which have increased in conspicuity. The pulmonary interstitial markings are more prominent diffusely today. The cardiac silhouette is normal in size. The central pulmonary vascularity remains prominent. A dual lumen dialysis type catheter is in place via the  left internal jugular approach with the distal port lying in the region of the junction of the SVC with the right atrium.  IMPRESSION: The findings are consistent with worsening pulmonary interstitial edema and increasing bilateral pleural effusions. No alveolar infiltrate is demonstrated.   Electronically Signed   By: David  [place]   On: 11/10/2013 07:55   Scheduled Medications:   . aspirin EC  81 mg Oral Daily  . budesonide  0.25 mg Nebulization BID  . calcium acetate  667 mg Oral TID WC  . darbepoetin  40 mcg Intravenous Q7 days  . doxercalciferol  2 mcg Intravenous Q M,W,F-HD  . doxycycline  100 mg Oral Q12H  . heparin  5,000 Units Subcutaneous Q8H  . insulin aspart  0-15 Units Subcutaneous TID WC  . insulin glargine  25 Units Subcutaneous QHS  . isosorbide  mononitrate  120 mg Oral Daily  . levalbuterol  0.63 mg Nebulization QID  . levothyroxine  25 mcg Oral QAC breakfast  . metoprolol tartrate  12.5 mg Oral Daily  . pantoprazole  40 mg Oral Daily  . simvastatin  10 mg Oral QPM  . sodium chloride  3 mL Intravenous Q12H  . tiotropium  18 mcg Inhalation Daily    I  have reviewed scheduled and prn medications.  Dialysis: [facility] on MWF .  4h 67kg - need to lower F160 Bath 2K/2.5Ca Heparin none L IJ TDC BFR 400 UF profile 4  Hectorol 2 EPO 5000 Venofer 50/wk   ASSESSMENT/RECOMMENDATIONS  Dyspnea / underlying COPD /compounded by vol excess/bilateral effusions  Desatted again with incr O2 requirements, CTand CXRwith enlarging effusions and compressive atelectasis Trying to keep as dry as possible with HD as BP allows but agree dialysis not very effective at removing 3rd spaced pleural fluid Agree with thoracentesis Will continue to try to lower EDW with dialysis if BP will allow (stopped amlodipine yesterday; now just on small dose metorpolol).    ESRD: HD MWF EDW 67 trying to lower to 66 Anemia / CKD: Last Hb 10.2, cont esa  MBD / CKD: cont vit D, phoslo 1 ac, last pth 334   HTN/volume: cont norvasc/metoprolol, lower volume w HD  COPD nebs/etc Multiple access failures: catheter-dependent   Camille Balynthia Christie Copley, MD [facility] [phone] Pager 11/10/2013, 8:19 AM

## 2013-11-10 NOTE — Progress Notes (Signed)
Physical Therapy Treatment Patient Details Name: [name] [name] MRN: [id number] DOB: [birth year] Today's Date: 11/10/2013 Time: 9326-7124 PT Time Calculation (min): 24 min  PT Assessment / Plan / Recommendation  History of Present Illness Pt admit with acute respiratory failure.     PT Comments   Pt admitted with above. Pt currently with functional limitations due to balance and endurance deficits. Continues to desat with exercise.  May need NHP prior to d/c home.  Pt will benefit from skilled PT to increase their independence and safety with mobility to allow discharge to the venue listed below.    Follow Up Recommendations  Supervision - Intermittent;SNF                 Equipment Recommendations  Other (comment) (4 wheeled RW)        Frequency Min 3X/week   Progress towards PT Goals Progress towards PT goals: Progressing toward goals  Plan Current plan remains appropriate    Precautions / Restrictions Precautions Precautions: Fall Restrictions Weight Bearing Restrictions: No   Pertinent Vitals/Pain Desat to 82% on nonrebreather mask with activity.  Other VSS, No pain.    Mobility  Bed Mobility Overal bed mobility: + 2 for safety/equipment Bed Mobility: Supine to Sit Supine to sit: Min guard Transfers Overall transfer level: Needs assistance Equipment used: None Transfers: Sit to/from Stand Sit to Stand: Min guard General transfer comment: Steadying assist needed. Ambulation/Gait Ambulation/Gait assistance: +2 physical assistance;Mod assist Ambulation Distance (Feet): 80 Feet Assistive device: 2 person hand held assist Gait Pattern/deviations: Step-to pattern;Decreased stride length;Wide base of support Gait velocity interpretation: <1.8 ft/sec, indicative of risk for recurrent falls General Gait Details: Pt needed multiple standing rest breaks due to desat to 82% several times with ambulation.  Pt would recover to 90% and > and then once she started ambulating again  would desat to between 82-86%.  Pt also with at least 3 LOB to her right needing min to mod assist to recover.      PT Goals (current goals can now be found in the care plan section)    Visit Information  Last PT Received On: 11/10/13 Assistance Needed: +2 (for ambulation) History of Present Illness: Pt admit with acute respiratory failure.      Subjective Data  Subjective: "I feel better."   Cognition  Cognition Arousal/Alertness: Awake/alert Behavior During Therapy: WFL for tasks assessed/performed Overall Cognitive Status: Within Functional Limits for tasks assessed    Balance  Balance Overall balance assessment: Needs assistance;History of Falls Sitting-balance support: No upper extremity supported;Feet supported Sitting balance-Leahy Scale: Good Postural control: Right lateral lean Standing balance support: Bilateral upper extremity supported;During functional activity Standing balance-Leahy Scale: Fair  End of Session PT - End of Session Equipment Utilized During Treatment: Gait belt;Oxygen Activity Tolerance: Patient limited by fatigue Patient left: in chair;with call bell/phone within reach Nurse Communication: Mobility status      [name] [name] [name] [name] 11/10/2013, 3:00 PM [facility] [phone] (pager)

## 2013-11-11 LAB — CBC
HCT: 29 % — ABNORMAL LOW (ref 36.0–46.0)
Hemoglobin: 9.4 g/dL — ABNORMAL LOW (ref 12.0–15.0)
MCH: 31 pg (ref 26.0–34.0)
MCHC: 32.4 g/dL (ref 30.0–36.0)
MCV: 95.7 fL (ref 78.0–100.0)
Platelets: 210 10*3/uL (ref 150–400)
RBC: 3.03 MIL/uL — AB (ref 3.87–5.11)
RDW: 17.3 % — ABNORMAL HIGH (ref 11.5–15.5)
WBC: 13.8 10*3/uL — ABNORMAL HIGH (ref 4.0–10.5)

## 2013-11-11 LAB — BASIC METABOLIC PANEL
BUN: 50 mg/dL — ABNORMAL HIGH (ref 6–23)
CALCIUM: 8.5 mg/dL (ref 8.4–10.5)
CO2: 25 mEq/L (ref 19–32)
Chloride: 93 mEq/L — ABNORMAL LOW (ref 96–112)
Creatinine, Ser: 4.67 mg/dL — ABNORMAL HIGH (ref 0.50–1.10)
GFR calc Af Amer: 10 mL/min — ABNORMAL LOW (ref >90)
GFR calc non Af Amer: 9 mL/min — ABNORMAL LOW (ref >90)
GLUCOSE: 157 mg/dL — AB (ref 70–99)
Potassium: 3.6 mEq/L — ABNORMAL LOW (ref 3.7–5.3)
SODIUM: 134 meq/L — AB (ref 137–147)

## 2013-11-11 LAB — RENAL FUNCTION PANEL
Albumin: 3 g/dL — ABNORMAL LOW (ref 3.5–5.2)
BUN: 53 mg/dL — ABNORMAL HIGH (ref 6–23)
CO2: 26 meq/L (ref 19–32)
CREATININE: 4.67 mg/dL — AB (ref 0.50–1.10)
Calcium: 8.6 mg/dL (ref 8.4–10.5)
Chloride: 90 mEq/L — ABNORMAL LOW (ref 96–112)
GFR calc Af Amer: 10 mL/min — ABNORMAL LOW (ref >90)
GFR, EST NON AFRICAN AMERICAN: 9 mL/min — AB (ref >90)
Glucose, Bld: 247 mg/dL — ABNORMAL HIGH (ref 70–99)
PHOSPHORUS: 3 mg/dL (ref 2.3–4.6)
Potassium: 3.3 mEq/L — ABNORMAL LOW (ref 3.7–5.3)
Sodium: 130 mEq/L — ABNORMAL LOW (ref 137–147)

## 2013-11-11 LAB — GLUCOSE, CAPILLARY
GLUCOSE-CAPILLARY: 265 mg/dL — AB (ref 70–99)
Glucose-Capillary: 103 mg/dL — ABNORMAL HIGH (ref 70–99)
Glucose-Capillary: 102 mg/dL — ABNORMAL HIGH (ref 70–99)
Glucose-Capillary: 102 mg/dL — ABNORMAL HIGH (ref 70–99)

## 2013-11-11 MED ORDER — DARBEPOETIN ALFA-POLYSORBATE 40 MCG/0.4ML IJ SOLN: 40.0000 ug | INTRAMUSCULAR | Status: DC

## 2013-11-11 MED ORDER — DOXERCALCIFEROL 4 MCG/2ML IV SOLN
INTRAVENOUS | Status: AC
  Filled 2013-11-11: qty 2

## 2013-11-11 MED ORDER — LEVALBUTEROL HCL 0.63 MG/3ML IN NEBU
0.6300 mg | INHALATION_SOLUTION | RESPIRATORY_TRACT | Status: DC | PRN
  Administered 2013-11-12: 0.63 mg via RESPIRATORY_TRACT

## 2013-11-11 NOTE — Progress Notes (Signed)
Pt arrived to the unit via wheel chair. Pt alert and oriented x4. VS stable. Denies any pain. No family present. Oriented to the staff and unit. Will cont to monitor.

## 2013-11-11 NOTE — Progress Notes (Signed)
Subjective:  Had a 900 ml left thoracentesis yesterday with improvement on that side O2 sats 94-97%  Objective:    Vital signs in last 24 hours: Filed Vitals:   11/11/13 0000 11/11/13 0400 11/11/13 0808 11/11/13 0827  BP: 121/37 144/40 163/62   Pulse: 81 88 97 95  Temp: 97.6 F (36.4 C) 97.6 F (36.4 C) 97.5 F (36.4 C)   TempSrc: Axillary Oral Oral   Resp: 15 18 17 20   Height:      Weight:  70 kg (154 lb 5.2 oz)    SpO2: 94% 97% 96% 97%   Weight change: 0.2 kg (7.1 oz)  Intake/Output Summary (Last 24 hours) at 11/11/13 1011 Last data filed at 11/11/13 0957  Gross per 24 hour  Intake    360 ml  Output      0 ml  Net    360 ml    Physical Exam:  Blood pressure 163/62, pulse 95, temperature 97.5 F (36.4 C), temperature source Oral, resp. rate 20, height 5\' 5"  (1.651 m), weight 70 kg (154 lb 5.2 oz), SpO2 97.00%. Elderly pleasant female, NAD on nasal cannula No rash, cyanosis  Sclera anicteric, throat clear  Improved breath sounds left chest; still reduced at right base. No wheeze this am RRR S1S2 no S3 Tachy about 120 Abd obese, soft, nt/nd No LE or UE edema  Neuro nf, ox3, alert  Left upper chest tunneled HD cath in place without tenderness   Recent Labs Lab 11/07/13 0455 11/07/13 2042 11/08/13 0430 11/09/13 1252 11/11/13 0320  NA 138  --  136* 133* 134*  K 4.5  --  4.0 3.9 3.6*  CL 97  --  92* 89* 93*  CO2 24  --  27 28 25   GLUCOSE 239*  --  86 139* 157*  BUN 55*  --  29* 65* 50*  CREATININE 3.54* 1.56* 2.29* 3.70* 4.67*  CALCIUM 8.9  --  8.9 8.3* 8.5  PHOS  --  4.4 4.2 4.3  --     Recent Labs Lab 11/08/13 0430 11/09/13 1252 11/10/13 1115  PROT  --   --  6.6  ALBUMIN 3.6 3.1*  --     Recent Labs Lab 11/07/13 0455 11/07/13 2042 11/08/13 0430 11/09/13 1244  WBC 14.5* 12.6* 13.5* 13.5*  NEUTROABS 12.5*  --   --   --   HGB 10.0* 10.3* 10.5* 10.2*  HCT 32.0* 31.6* 32.8* 30.5*  MCV 98.8 97.5 97.3 95.0  PLT 240 250 236 258        Component Value Date/Time   PHART 7.372 11/10/2013 0041   PCO2ART 47.3* 11/10/2013 0041   PO2ART 55.0* 11/10/2013 0041   HCO3 26.8* 11/10/2013 0041   TCO2 28.3 11/10/2013 0041   ACIDBASEDEF 0.7 11/07/2013 1815   O2SAT 87.3 11/10/2013 0041    Recent Labs Lab 11/10/13 0816 11/10/13 1140 11/10/13 1608 11/10/13 2120 11/11/13 0804  GLUCAP 123* 138* 223* 260* 103*   Studies/Results: Dg Chest Port 1 View  11/10/2013   CLINICAL DATA:  Status post left thoracentesis.  EXAM: PORTABLE CHEST - 1 VIEW  COMPARISON:  Plain film of the chest 11/10/2013 and CT chest 11/08/2013.  FINDINGS: Left pleural effusion is markedly decreased after thoracentesis. No pneumothorax is identified. Right pleural effusion is unchanged. There is basilar atelectasis. Heart size is mildly enlarged. Dialysis catheter is again noted.  IMPRESSION: Marked decrease in a left pleural effusion after thoracentesis. Negative for pneumothorax.  No change in a small to moderate  right pleural effusion.   Electronically Signed   By: Drusilla Kanner M.D.   On: 11/10/2013 11:11   Dg Chest Port 1 View  11/10/2013   CLINICAL DATA:  Dyspnea and effusions  EXAM: PORTABLE CHEST - 1 VIEW  COMPARISON:  November 08, 2013  FINDINGS: The lungs are reasonably well inflated. Increased density in the mid and lower lung zones has progressed. There are bilateral pleural effusions layering posteriorly which have increased in conspicuity. The pulmonary interstitial markings are more prominent diffusely today. The cardiac silhouette is normal in size. The central pulmonary vascularity remains prominent. A dual lumen dialysis type catheter is in place via the left internal jugular approach with the distal port lying in the region of the junction of the SVC with the right atrium.  IMPRESSION: The findings are consistent with worsening pulmonary interstitial edema and increasing bilateral pleural effusions. No alveolar infiltrate is demonstrated.   Electronically Signed   By:  David  [place]   On: 11/10/2013 07:55   2D ECHO 11/10/13 Study Conclusions Left ventricle: Poor acoustic windows limit study. LVEF is approximately 30 to 35% with akinesis of the distal anterior, apical, mid/distal lateral, basal inferior, distal inferior walls The cavity size wasnormal. Wall thickness was normal. Doppler parameters are consistent with abnormal left ventricular relaxation (grade 1 diastolic dysfunction).  Mitral valve: Mild regurgitation.   Scheduled Medications:   . aspirin EC  81 mg Oral Daily  . budesonide  0.25 mg Nebulization BID  . calcium acetate  667 mg Oral TID WC  . [START ON 11/16/2013] darbepoetin  40 mcg Intravenous Q Wed-HD  . doxercalciferol  2 mcg Intravenous Q M,W,F-HD  . doxycycline  100 mg Oral Q12H  . heparin  5,000 Units Subcutaneous Q8H  . insulin aspart  0-15 Units Subcutaneous TID WC  . insulin glargine  25 Units Subcutaneous QHS  . isosorbide mononitrate  120 mg Oral Daily  . levalbuterol  0.63 mg Nebulization QID  . levothyroxine  25 mcg Oral QAC breakfast  . metoprolol tartrate  12.5 mg Oral Daily  . pantoprazole  40 mg Oral Daily  . simvastatin  10 mg Oral QPM  . sodium chloride  3 mL Intravenous Q12H  . tiotropium  18 mcg Inhalation Daily    Dialysis:  [facility] on MWF .  4h 67kg   F160 Bath 2K/2.5Ca Heparin none L IJ TDC BFR 400 UF profile 4  Hectorol 2 EPO 5000 Venofer 50/wk   ASSESSMENT/RECOMMENDATIONS  Dyspnea / underlying COPD /compounded by vol excess/bilateral effusions  S/p left thoracentesis with improvement in exam and breathing Will try to lower EDW with dialysis if BP will allow, but with no peripheral edema at all unclear how much drier we can get her LV dysfunction with WMA's by ECHO (LVEF 30-35%) likely compounds issues with drops in BP on HD    ESRD: HD MWF EDW 67 will try to lower but not sure how much drier we can get her  Anemia / CKD: Last Hb 10, cont esa  MBD / CKD: cont vit D, phoslo 1 ac, last pth 334  HTN/volume:  Norvasc stopped; metoprolol, vol control with HD  Multiple access failures: catheter-dependent   Camille Bal, MD [facility] [phone] Pager 11/11/2013, 10:11 AM

## 2013-11-11 NOTE — Procedures (Signed)
I have personally attended this patient's dialysis session.  TDC at 400.  4K bath for K 3.3 Pre HD weight 68.2 with UF goal 2-2.5 L  [name] [name]

## 2013-11-11 NOTE — Progress Notes (Signed)
To hemo via sttetcher

## 2013-11-11 NOTE — Progress Notes (Signed)
Report called to Tai, RN

## 2013-11-11 NOTE — Progress Notes (Signed)
[facility] Progress Note [employer] TEAM 1 - Stepdown/ICU TEAM   [name] [name] PPI:[id number] DOB: [birth year] DOA: 11/07/2013 PCP: Garnette Czech, MD  Brief narrative: 73yo F with history of COPD on home oxygen 3 L and end-stage renal disease on hemodialysis scheduled Monday Wednesdays and Fridays who was recently admitted for an acute COPD exacerbation, (discharged on 12/29). She complete a seven-day course of doxycycline but did not tolerate the prescribed prednisone due to feeling jittery therefore, did not complete the course.  She presented to the ED with increasing dyspnea and increased O2 requirements. CXR was suggestive of pulmonary congestion and emergent dialysis was performed after admission.   Subjective: Patient alert and endorses feels much better post thoracentesis. States has good appetite. No cough or shortness of breath at rest.  Assessment/Plan:  Acute respiratory failure with hypoxia (a) COPD exacerbation - now stable - currently on higher than usual amount of O2 (baseline is 2 -3 L)  (b) Fluid overload in ESRD - Nephrology attempted to lower her dry weight on 1/7 but met with hypotension which limited fluid removal - CXR revealed increased pulm edema and continued effusions and now status post thoracentesis with 900 cc clear fluid taken off -Attempt to wean to nasal cannula  Diabetes mellitus - on home dose of Lantus and sliding scale insulin - CBG stable  Hypothyroid - cont Synthroid  HTN - cont home meds   GERD - cont Protonix   H/o DVT and PE - 2 yrs ago - CTA chest without evidence of PE  Code Status: FULL Family Communication: No family present at time of exam Disposition Plan: Transfer to Telemetry - wean O2 as able - follow effusion/watch for reaccumulation   Consultants: Nephrology  Procedures: Thoracentesis - L - 900cc out - 1/8  Antibiotics: Doxycycline 1/05 >>  DVT prophylaxis: SQ Heparin   Objective: Blood pressure  163/62, pulse 95, temperature 97.5 F (36.4 C), temperature source Oral, resp. rate 20, height _0  (1.651 m), weight 154 lb 5.2 oz (70 kg), SpO2 97.00%.  Intake/Output Summary (Last 24 hours) at 11/11/13 1124 Last data filed at 11/11/13 0957  Gross per 24 hour  Intake    360 ml  Output      0 ml  Net    360 ml   Exam: General: In no acute respiratory distress-noticed has eaten 100% of breakfast tray Lungs: Diminished but equal somewhat decreased in bases, Ventimask removed and nasal cannula applied at 6 liters Cardiovascular: Regular yet intermittently tachycardic rate and rhythm without murmur gallop or rub normal S1 and S2 Abdomen: Nontender, nondistended, soft, bowel sounds positive, no rebound, no ascites, no appreciable mass Extremities: No significant cyanosis, clubbing, or edema bilateral lower extremities  Data Reviewed: Basic Metabolic Panel:  Recent Labs Lab 11/07/13 0455 11/07/13 2042 11/08/13 0430 11/09/13 1252 11/11/13 0320  NA 138  --  136* 133* 134*  K 4.5  --  4.0 3.9 3.6*  CL 97  --  92* 89* 93*  CO2 24  --  _1 GLUCOSE 239*  --  86 139* 157*  BUN 55*  --  29* 65* 50*  CREATININE 3.54* 1.56* 2.29* 3.70* 4.67*  CALCIUM 8.9  --  8.9 8.3* 8.5  MG  --  1.9  --   --   --   PHOS  --  4.4 4.2 4.3  --    Liver Function Tests:  Recent Labs Lab 11/08/13 0430 11/09/13 1252 11/10/13 1115  PROT  --   --  6.6  ALBUMIN 3.6 3.1*  --    CBC:  Recent Labs Lab 11/07/13 0455 11/07/13 2042 11/08/13 0430 11/09/13 1244  WBC 14.5* 12.6* 13.5* 13.5*  NEUTROABS 12.5*  --   --   --   HGB 10.0* 10.3* 10.5* 10.2*  HCT 32.0* 31.6* 32.8* 30.5*  MCV 98.8 97.5 97.3 95.0  PLT 240 250 236 258   BNP (last 3 results)  Recent Labs  10/26/13 0510 11/07/13 0455  PROBNP 9552.0* 22434.0*   CBG:  Recent Labs Lab 11/10/13 0816 11/10/13 1140 11/10/13 1608 11/10/13 2120 11/11/13 0804  GLUCAP 123* 138* 223* 260* 103*    Studies:  Recent x-ray studies  have been reviewed in detail by the Attending Physician  Scheduled Meds:  Scheduled Meds: . aspirin EC  81 mg Oral Daily  . budesonide  0.25 mg Nebulization BID  . calcium acetate  667 mg Oral TID WC  . [START ON 11/16/2013] darbepoetin  40 mcg Intravenous Q Wed-HD  . doxercalciferol  2 mcg Intravenous Q M,W,F-HD  . doxycycline  100 mg Oral Q12H  . heparin  5,000 Units Subcutaneous Q8H  . insulin aspart  0-15 Units Subcutaneous TID WC  . insulin glargine  25 Units Subcutaneous QHS  . isosorbide mononitrate  120 mg Oral Daily  . levalbuterol  0.63 mg Nebulization QID  . levothyroxine  25 mcg Oral QAC breakfast  . metoprolol tartrate  12.5 mg Oral Daily  . pantoprazole  40 mg Oral Daily  . simvastatin  10 mg Oral QPM  . sodium chloride  3 mL Intravenous Q12H  . tiotropium  18 mcg Inhalation Daily    Time spent on care of this patient: 78mns   ELLIS,ALLISON L., ANP  [facility] Office  [phone] - Text Page per AShea Evansas per below:  On-Call/Text Page:      [email or web address] [email or web address]      password [email or web address]  If 7PM-7AM, please contact night-coverage [email or web address] Password [email or web address] 11/11/2013, 11:24 AM   LOS: 4 days   I have personally examined this patient and reviewed the entire database. I have reviewed the above note, made any necessary editorial changes, and agree with its content.  JCherene Altes MD [facility]

## 2013-11-12 ENCOUNTER — Inpatient Hospital Stay (HOSPITAL_COMMUNITY): Payer: Medicare Other

## 2013-11-12 LAB — RENAL FUNCTION PANEL
Albumin: 3.1 g/dL — ABNORMAL LOW (ref 3.5–5.2)
BUN: 18 mg/dL (ref 6–23)
CHLORIDE: 97 meq/L (ref 96–112)
CO2: 28 mEq/L (ref 19–32)
Calcium: 8.9 mg/dL (ref 8.4–10.5)
Creatinine, Ser: 2.54 mg/dL — ABNORMAL HIGH (ref 0.50–1.10)
GFR calc Af Amer: 21 mL/min — ABNORMAL LOW (ref >90)
GFR calc non Af Amer: 18 mL/min — ABNORMAL LOW (ref >90)
GLUCOSE: 62 mg/dL — AB (ref 70–99)
PHOSPHORUS: 1.7 mg/dL — AB (ref 2.3–4.6)
POTASSIUM: 5 meq/L (ref 3.7–5.3)
Sodium: 137 mEq/L (ref 137–147)

## 2013-11-12 LAB — CBC
HEMATOCRIT: 31.4 % — AB (ref 36.0–46.0)
Hemoglobin: 10.1 g/dL — ABNORMAL LOW (ref 12.0–15.0)
MCH: 31.4 pg (ref 26.0–34.0)
MCHC: 32.2 g/dL (ref 30.0–36.0)
MCV: 97.5 fL (ref 78.0–100.0)
Platelets: 217 10*3/uL (ref 150–400)
RBC: 3.22 MIL/uL — AB (ref 3.87–5.11)
RDW: 18.3 % — ABNORMAL HIGH (ref 11.5–15.5)
WBC: 12.6 10*3/uL — ABNORMAL HIGH (ref 4.0–10.5)

## 2013-11-12 LAB — GLUCOSE, CAPILLARY
GLUCOSE-CAPILLARY: 89 mg/dL (ref 70–99)
Glucose-Capillary: 59 mg/dL — ABNORMAL LOW (ref 70–99)
Glucose-Capillary: 211 mg/dL — ABNORMAL HIGH (ref 70–99)
Glucose-Capillary: 141 mg/dL — ABNORMAL HIGH (ref 70–99)
Glucose-Capillary: 178 mg/dL — ABNORMAL HIGH (ref 70–99)

## 2013-11-12 MED ORDER — INSULIN ASPART 100 UNIT/ML ~~LOC~~ SOLN
0.0000 [IU] | Freq: Three times a day (TID) | SUBCUTANEOUS | Status: DC
  Administered 2013-11-12: 1 [IU] via SUBCUTANEOUS
  Administered 2013-11-12 – 2013-11-13 (×2): 3 [IU] via SUBCUTANEOUS

## 2013-11-12 MED ORDER — INSULIN DETEMIR 100 UNIT/ML ~~LOC~~ SOLN
15.0000 [IU] | Freq: Every day | SUBCUTANEOUS | Status: DC
  Administered 2013-11-12: 15 [IU] via SUBCUTANEOUS
  Filled 2013-11-12 (×2): qty 0.15

## 2013-11-12 MED ORDER — INSULIN ASPART 100 UNIT/ML ~~LOC~~ SOLN
3.0000 [IU] | Freq: Three times a day (TID) | SUBCUTANEOUS | Status: DC
  Administered 2013-11-12 – 2013-11-13 (×3): 3 [IU] via SUBCUTANEOUS

## 2013-11-12 NOTE — Progress Notes (Signed)
[name] SnipeJudy A [name] ZOX:[id number] DOB: 03/04/1941 DOA: 11/07/2013 PCP: [name],[name], MD  Brief narrative:  73 y/o ?, known h/o GOLD COPD ? 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 + peripheral vascular disease 07/2007 Admitted with acute pulmonary edema 11/07/13 in a setting of recent hospital stay 10/2013 COPD exacerbation given BiPAP on admission at bedtime, sent home on 4 L of oxygen and was placed on doxycycline twice a day but could not tolerate this -initial BNP 22,000, CXR = commonly venous congestion, hypoxic on admission 60s- Nephrology was consulted PCCM did Thoracocentesis 1/8, Pseudoexudative by light criteria protein- cultures negative x1 day  Past medical history-As per Problem list Chart reviewed as below-  Consultants:  Nephro    Procedures:  Thora 1/8  Antibiotics:  Doxy   Subjective  alert pleasant oriented Is much better than prior. Dry weight is 68 kg, currently 65 Had 2 L pulled off yesterday 1/9 States she has no fever or chills no nausea or vomiting Tolerating diet, loose stools x1 yesterday    Objective    Interim History: None  Telemetry: Sinus with PVCs   Objective: Filed Vitals:   11/11/13 1643 11/11/13 1714 11/11/13 2127 11/12/13 0605  BP: 107/54 120/69 119/56 151/59  Pulse: 105 102 92 89  Temp: 97.7 F (36.5 C) 97.5 F (36.4 C) 98.4 F (36.9 C) 97.8 F (36.6 C)  TempSrc: Oral Oral Oral Oral  Resp: 20 20 20 20   Height:      Weight:   65.545 kg (144 lb 8 oz)   SpO2: 97% 94% 100% 95%    Intake/Output Summary (Last 24 hours) at 11/12/13 0846 Last data filed at 11/12/13  0605  Gross per 24 hour  Intake    600 ml  Output   2200 ml  Net  -1600 ml    Exam:  General: EOMI, NCAT, arcus Cardiovascular: S1-S2 no murmur rub or gallop Respiratory: Clinically clear no added sound  Abdomen: Soft nontender nondistended no rebound Skin no lower extremity edema Neuro grossly intact  Data Reviewed: Basic Metabolic Panel:  Recent Labs Lab 11/07/13 2042 11/08/13 0430 11/09/13 1252 11/11/13 0320 11/11/13 1214 11/12/13 0413  NA  --  136* 133* 134* 130* 137  K  --  4.0 3.9 3.6* 3.3* 5.0  CL  --  92* 89* 93* 90* 97  CO2  --  27 28 25 26 28   GLUCOSE  --  86 139* 157* 247* 62*  BUN  --  29* 65* 50* 53* 18  CREATININE 1.56* 2.29* 3.70* 4.67* 4.67* 2.54*  CALCIUM  --  8.9 8.3* 8.5 8.6 8.9  MG 1.9  --   --   --   --   --   PHOS 4.4 4.2 4.3  --  3.0 1.7*   Liver Function Tests:  Recent Labs Lab 11/08/13 0430 11/09/13 1252 11/10/13 1115 11/11/13 1214 11/12/13 0413  PROT  --   --  6.6  --   --   ALBUMIN 3.6 3.1*  --  3.0* 3.1*   No results found for this basename: LIPASE, AMYLASE,  in the last 168 hours No results found for this basename: AMMONIA,  in the last 168 hours CBC:  Recent Labs Lab 11/07/13  1610 11/07/13 2042 11/08/13 0430 11/09/13 1244 11/11/13 1214 11/12/13 0413  WBC 14.5* 12.6* 13.5* 13.5* 13.8* 12.6*  NEUTROABS 12.5*  --   --   --   --   --   HGB 10.0* 10.3* 10.5* 10.2* 9.4* 10.1*  HCT 32.0* 31.6* 32.8* 30.5* 29.0* 31.4*  MCV 98.8 97.5 97.3 95.0 95.7 97.5  PLT 240 250 236 258 210 217   Cardiac Enzymes: No results found for this basename: CKTOTAL, CKMB, CKMBINDEX, TROPONINI,  in the last 168 hours BNP: No components found with this basename: POCBNP,  CBG:  Recent Labs Lab 11/11/13 1623 11/11/13 1707 11/11/13 2130 11/12/13 0738 11/12/13 0829  GLUCAP 102* 102* 265* 59* 89    Recent Results (from the past 240 hour(s))  BODY FLUID CULTURE     Status: None   Collection Time    11/10/13 10:10 AM      Result Value  Range Status   Specimen Description PLEURAL LEFT FLUID   Final   Special Requests NONE   Final   Gram Stain     Final   Value: RARE WBC PRESENT,BOTH PMN AND MONONUCLEAR     NO ORGANISMS SEEN     Performed at [facility]   Culture     Final   Value: NO GROWTH 1 DAY     Performed at [facility]   Report Status PENDING   Incomplete     Studies:              All Imaging reviewed and is as per above notation   Scheduled Meds: . aspirin EC  81 mg Oral Daily  . budesonide  0.25 mg Nebulization BID  . calcium acetate  667 mg Oral TID WC  . [START ON 11/16/2013] darbepoetin  40 mcg Intravenous Q Wed-HD  . doxercalciferol  2 mcg Intravenous Q M,W,F-HD  . doxycycline  100 mg Oral Q12H  . heparin  5,000 Units Subcutaneous Q8H  . insulin aspart  0-15 Units Subcutaneous TID WC  . insulin glargine  25 Units Subcutaneous QHS  . isosorbide mononitrate  120 mg Oral Daily  . levalbuterol  0.63 mg Nebulization QID  . levothyroxine  25 mcg Oral QAC breakfast  . metoprolol tartrate  12.5 mg Oral Daily  . pantoprazole  40 mg Oral Daily  . simvastatin  10 mg Oral QPM  . sodium chloride  3 mL Intravenous Q12H  . tiotropium  18 mcg Inhalation Daily   Continuous Infusions:    Assessment/Plan: 1. Acute hypoxic respiratory failure resolved. Combination of potential pneumonia/acute decompensated CHF diastoli 2. Likely pseudoexudative pleural effusion-pleural tap 1/8 900 cc, await cultures times one more day. Dry weight has been reduced by nephrology to 65 kg. Chest x-ray 1/10 = result pleural effusions, no comment on pneumonic process 3. Potential pneumonia-? Need to continue doxycycline-was discharged on this. Has received 6 days from 1/5--1/10.  Will discontinue has no indices of infection 4. ESRD MWF [place]-nephrology is arranging followup for dialysis there 5. Leukocytosis-secondary to primary process, also received steroids prednisone 60 mg until 1/5 afebrile at current  time 6. Metabolic bone disease-continue PhosLo 667 3 times a day with meals, doxercalciferol 2 mcg per nephrology 7. Hyperlipidemia, continue Zocor 10 mg every afternoon 8. Uncontrolled hypertension-metoprolol 12.5 daily, isosorbide 120 , consider addition of ACE or CCB as per renal-would hold on dialysis days 9. Prior history NSTEMI 3/13-continue aspirin 81 daily 10. Insulin-dependent type 2 diabetes-blood sugars low normal this morning, 62-changed  moderate supplemental scale as sensitive as it is no longer on steroids, decreased Levemir 25 units >15 units 11/12/13 11. Hypothyroidism-continue Synthroid 25 every morning 12. Anemia of renal disease-per nephrology. 13. Gold stage COPD 3-4-continue nasal oxygen 3-4 L at home. Continue Pulmicort 0.25 twice a day, Xopenex 0.63 4 times a day, Spiriva 18 mcg daily, no need for systemic steroids at present  Code Status: Full Family Communication: Called daughter but no response Disposition Plan: Inpatient awaiting culture results-potentially DC a.m.  Pleas Koch, MD  [facility] Pager [phone] 11/12/2013, 8:46 AM    LOS: 5 days

## 2013-11-12 NOTE — Progress Notes (Signed)
Subjective:  Fees better since thoracentesis; had  hd yesterday Weight down to 65.5  Objective Vital signs in last 24 hours: Filed Vitals:   11/11/13 1643 11/11/13 1714 11/11/13 2127 11/12/13 0605  BP: 107/54 120/69 119/56 151/59  Pulse: 105 102 92 89  Temp: 97.7 F (36.5 C) 97.5 F (36.4 C) 98.4 F (36.9 C) 97.8 F (36.6 C)  TempSrc: Oral Oral Oral Oral  Resp: 20 20 20 20   Height:      Weight:   65.545 kg (144 lb 8 oz)   SpO2: 97% 94% 100% 95%  Physical Exam: General: Alert nad  Heart: RRR, no rub or mur Lungs: Decr sl L > R base  Abdomen: soft nt Extremities: Dialysis Access:  No pedal edema/ L IJ Perm cath  Labs: Basic Metabolic Panel:  Recent Labs Lab 11/09/13 1252 11/11/13 0320 11/11/13 1214 11/12/13 0413  NA 133* 134* 130* 137  K 3.9 3.6* 3.3* 5.0  CL 89* 93* 90* 97  CO2 28 25 26 28   GLUCOSE 139* 157* 247* 62*  BUN 65* 50* 53* 18  CREATININE 3.70* 4.67* 4.67* 2.54*  CALCIUM 8.3* 8.5 8.6 8.9  PHOS 4.3  --  3.0 1.7*    Recent Labs Lab 11/09/13 1252 11/10/13 1115 11/11/13 1214 11/12/13 0413  PROT  --  6.6  --   --   ALBUMIN 3.1*  --  3.0* 3.1*  CBC:  Recent Labs Lab 11/07/13 0455 11/07/13 2042 11/08/13 0430 11/09/13 1244 11/11/13 1214 11/12/13 0413  WBC 14.5* 12.6* 13.5* 13.5* 13.8* 12.6*  NEUTROABS 12.5*  --   --   --   --   --   HGB 10.0* 10.3* 10.5* 10.2* 9.4* 10.1*  HCT 32.0* 31.6* 32.8* 30.5* 29.0* 31.4*  MCV 98.8 97.5 97.3 95.0 95.7 97.5  PLT 240 250 236 258 210 217    Recent Labs Lab 11/11/13 0804 11/11/13 1623 11/11/13 1707 11/11/13 2130 11/12/13 0738  GLUCAP 103* 102* 102* 265* 59*   Results for [name] [name], [name] [name] (MRN [id number]) as of 11/12/2013 12:26  Ref. Range 11/10/2013 10:10  Color, Fluid Latest Range: YELLOW  YELLOW ([name])  WBC, Fluid Latest Range: 0-1000 cu mm 352  Lymphs, Fluid No range found 67  Appearance, Fluid Latest Range: CLEAR  HAZY ([name])  Neutrophil Count, Fluid Latest Range: 0-25 % 23  Monocyte-Macrophage-Serous  Fluid Latest Range: 50-90 % 10 (L)  Results for [name] [name], [name] [name] (MRN [id number]) as of 11/12/2013 12:26  Ref. Range 11/10/2013 10:10  Cholesterol, Fluid No range found 22  Chol, Fluid Type No range found CORRECTED ON 01/08 AT 1042: PREVIOUSLY REPORTED AS PLEURAL  Fluid Type-FLDH No range found PLEURAL  LD, Fluid Latest Range: 3-23 U/L 61 (H)  Total protein, fluid No range found 1.7  Fluid Type-FCT No range found PLEURAL  Fluid Type-FTP No range found PLEURAL  Results for [name] [name], [name] [name] (MRN [id number]) as of 11/12/2013 12:26  Ref. Range 11/10/2013 10:10  Monocyte-Macrophage-Serous Fluid Latest Range: 50-90 % 10 (L)    Studies/Results: Dg Chest Port 1 View  11/10/2013   CLINICAL DATA:  Status post left thoracentesis.  EXAM: PORTABLE CHEST - 1 VIEW  COMPARISON:  Plain film of the chest 11/10/2013 and CT chest 11/08/2013.  FINDINGS: Left pleural effusion is markedly decreased after thoracentesis. No pneumothorax is identified. Right pleural effusion is unchanged. There is basilar atelectasis. Heart size is mildly enlarged. Dialysis catheter is again noted.  IMPRESSION: Marked decrease in [name] left pleural effusion after thoracentesis. Negative  for pneumothorax.  No change in [name] small to moderate right pleural effusion.   Electronically Signed   By: Drusilla Kanner M.D.   On: 11/10/2013 11:11   Scheduled Medications:   . aspirin EC  81 mg Oral Daily  . budesonide  0.25 mg Nebulization BID  . calcium acetate  667 mg Oral TID WC  . [START ON 11/16/2013] darbepoetin  40 mcg Intravenous Q Wed-HD  . doxercalciferol  2 mcg Intravenous Q M,W,F-HD  . doxycycline  100 mg Oral Q12H  . heparin  5,000 Units Subcutaneous Q8H  . insulin aspart  0-15 Units Subcutaneous TID WC  . insulin glargine  25 Units Subcutaneous QHS  . isosorbide mononitrate  120 mg Oral Daily  . levalbuterol  0.63 mg Nebulization QID  . levothyroxine  25 mcg Oral QAC breakfast  . metoprolol tartrate  12.5 mg Oral Daily  . pantoprazole  40 mg  Oral Daily  . simvastatin  10 mg Oral QPM  . sodium chloride  3 mL Intravenous Q12H  . tiotropium  18 mcg Inhalation Daily    Dialysis OP Orders=  [facility] on MWF .  4h 67kg-->lowered to 66.5  F160 Bath 2K/2.5Ca Heparin none L IJ TDC BFR 400 UF profile 4  Hectorol 2 EPO 5000 Venofer 50/wk   ASSESSMENT/RECOMMENDATIONS   1. Dyspnea / underlying COPD /compounded by vol excess/bilateral effusions  S/p left thoracentesis with improvement in exam and breathing;  Lowering  EDW with dialysis if BP will allow, but with no peripheral edema at all unclear how much drier we can get her; awaiting final cultures on pleural fluid ("pseudoexudative" On doxycyline 2. LV dysfunction with WMA's by ECHO (LVEF 30-35%) likely compounds issues with drops in BP on HD  3. ESRD: HD MWF EDW 67 wt to 65.5 yesterday" tolerated' she tells me/ try to lower but not sure how much drier we can get her - 65.5 with HD yesterday; K 5 today 1 day post HD (low yesterday and done on 4K bath) Recheck in the AM 4. Anemia / CKD: Last Hb 9.4> 10.1 this am, cont esa  5. MBD / CKD: cont vit D, phoslo 1 ac, last pth 334 / phos 3.7> 1.7  Hold binder 6. HTN/volume: Norvasc stopped; metoprolol, under edw 65.43m yesterday/2200 cc uf yesterday/ vol control with HD  7. Multiple access failures: catheter-dependent 8. Dispo - possible D/C in the AM    Lenny Pastel, PA-C [facility] Beeper [phone] 11/12/2013,8:27 AM  LOS: 5 days  I have seen and examined this patient and agree with plan with highlighted additions. Marsheila Alejo B,MD 11/12/2013 12:25 PM

## 2013-11-13 LAB — RENAL FUNCTION PANEL
Albumin: 3 g/dL — ABNORMAL LOW (ref 3.5–5.2)
BUN: 40 mg/dL — AB (ref 6–23)
CALCIUM: 8.8 mg/dL (ref 8.4–10.5)
CO2: 26 meq/L (ref 19–32)
CREATININE: 4.07 mg/dL — AB (ref 0.50–1.10)
Chloride: 92 mEq/L — ABNORMAL LOW (ref 96–112)
GFR calc non Af Amer: 10 mL/min — ABNORMAL LOW (ref >90)
GFR, EST AFRICAN AMERICAN: 12 mL/min — AB (ref >90)
GLUCOSE: 128 mg/dL — AB (ref 70–99)
Phosphorus: 3.4 mg/dL (ref 2.3–4.6)
Potassium: 5.3 mEq/L (ref 3.7–5.3)
SODIUM: 133 meq/L — AB (ref 137–147)

## 2013-11-13 LAB — BODY FLUID CULTURE: Culture: NO GROWTH

## 2013-11-13 LAB — GLUCOSE, CAPILLARY
GLUCOSE-CAPILLARY: 243 mg/dL — AB (ref 70–99)
Glucose-Capillary: 98 mg/dL (ref 70–99)

## 2013-11-13 MED ORDER — INSULIN GLARGINE 100 UNIT/ML ~~LOC~~ SOLN: 15.0000 [IU] | Freq: Every day | SUBCUTANEOUS | Status: DC

## 2013-11-13 MED ORDER — TIOTROPIUM BROMIDE MONOHYDRATE 18 MCG IN CAPS: 18.0000 ug | ORAL_CAPSULE | Freq: Every day | RESPIRATORY_TRACT | Status: AC

## 2013-11-13 NOTE — Progress Notes (Signed)
Patient discharge Home per Md order.  Discharge instructions reviewed with patient and family.  Copies of all forms given and explained. Patient/family voiced understanding of all instructions.  Discharge in no acute distress. Willliam Pettet B. Josanne Boerema, RN BC, BSN, MSN 

## 2013-11-13 NOTE — Discharge Summary (Addendum)
Physician Discharge Summary  [name] [name] FAO:[id number] DOB: February 18, 1941 DOA: 11/07/2013  PCP: Quentin Mulling, MD  Admit date: 11/07/2013 Discharge date: 11/13/2013  Time spent: 45 minutes  Recommendations for Outpatient Follow-up:  1. Please full final results for pleural fluid culture 2. Consider OP Cardiology Appt for decreased EF-Call Dr. Verna Czech Office 3. Please consider pulmonary followup in the outpatient setting to optimize COPD care-Call Dr. Juanetta Gosling office 4. End dialysis weight has been dropped to 65.5, please maintain at this level 5. Monitor labs such as renal panel and CBC in about 5 days 6. Renal diet as well as fluid restriction enforced 7. She will continue her home oxygen [employer]   Discharge Diagnoses:  Principal Problem:   Acute respiratory failure with hypoxia Active Problems:   ESRD (end stage renal disease) on dialysis   Diabetes mellitus   COPD exacerbation   Discharge Condition: Stable  Diet recommendation: Renal  Filed Weights   11/11/13 1520 11/11/13 2127 11/12/13 2127  Weight: 66 kg (145 lb 8.1 oz) 65.545 kg (144 lb 8 oz) 65.545 kg (144 lb 8 oz)    History of present illness:  73 y/o ?, known h/o GOLD COPD ? 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 Acute hypoxic respiratory failure resolved. Combination of potential pneumonia/acute decompensated CHF EF 30-35% diastolic.   2. Likely pseudoexudative pleural effusion-pleural tap 1/8 900 cc, await cultures times one more day. Dry weight has been reduced by nephrology to 65 kg. Chest x-ray 1/10 = resolved pleural effusions, no comment on pneumonic process ?Empiric Abx coverage doxi d/c-no fevers or chills subsequently 3. Potential pneumonia-? Need to continue doxycycline-was discharged on this. Has received 6 days from 1/5--1/10. Will discontinue has no indices of infection 4. ESRD MWF -nephrology is arranging followup for dialysis there 5. Leukocytosis-secondary to primary process, also received steroids prednisone 60 mg until 1/5 afebrile at current time 6. Metabolic bone disease-continue PhosLo 667 3 times a day with meals, doxercalciferol 2 mcg per nephrology 7. Hyperlipidemia, continue Zocor 10 mg every afternoon 8. Uncontrolled hypertension-metoprolol 12.5 daily, isosorbide 120 , advised patient to take at nights-d/c Amlodipine this admit. Strict control might be limited by need to control volume  9. Prior history NSTEMI 3/13-continue aspirin 81 daily 10. Insulin-dependent type 2 diabetes-blood sugars low normal on Tele floor- decreased Levemir 25 units >15 units 11/12/13.  Continue reg ssi at home 11. Hypothyroidism-continue Synthroid 25 every morning 12. Anemia of renal disease-per nephrology. 13. Gold stage COPD 3-4-continue nasal oxygen 3-4 L at home. Continue Pulmicort 0.25 twice a day, Xopenex 0.63 4 times a day, Spiriva 18 mcg daily, no need for systemic steroids at present.  OV scheduled on /dc hedueld for  follow-up    Consultants:  Nephro Pulm    Procedures:  Thora  1/8  Antibiotics:  Doxy ? Start--->1/10   Discharge Exam: Filed Vitals:   11/13/13 0554  BP: 145/78  Pulse: 104  Temp: 98.8 F (37.1 C)  Resp: 20    General: NO issues, sitting up at bedsdie eating.   Cardiovascular:  S1-S2 no murmur rub or gallop  Respiratory:  clinically clear no lower extremity edema  Discharge Instructions  Follow-up Information   Follow up with HOOPER,[name], MD. Schedule an appointment as soon as possible for a visit in 5 days.   Specialty:  Geriatric Medicine   Contact information:   [address] [place] [address] [phone]       Follow up with HAWKINS,EDWARD L, MD. Schedule an appointment as soon as possible for a visit in 2 weeks.   Specialty:  Pulmonary Disease   Contact information:   [address] [place] [address] [phone]       Follow up with Dina Rich, F, MD. Schedule an appointment as soon as possible for a visit in 1 week.   Specialty:  Cardiology   Contact information:   [address] [place] [address] [phone]        Discharge Orders   Future Orders Complete By Expires   Diet - low sodium heart healthy  As directed    Discharge instructions  As directed    Comments:     Please see your regular doctor in a week Please go see her lung doctor in 2 weeks Please continue using Spiriva that was started in the hospital Please ensure that you keep a close watch on your weight and on how much fluid you drink   Increase activity slowly  As directed        Medication List    STOP taking these medications       amLODipine 10 MG tablet  Commonly known as:  NORVASC     doxycycline 100 MG tablet  Commonly known as:  VIBRA-TABS     predniSONE 20 MG tablet  Commonly known as:  DELTASONE      TAKE these medications       albuterol (5 MG/ML) 0.5% nebulizer solution  Commonly known as:  PROVENTIL  Take 0.5 mLs (2.5 mg total) by nebulization 3 (three) times  daily.     aspirin EC 81 MG tablet  Take 81 mg by mouth daily.     budesonide 0.25 MG/2ML nebulizer solution  Commonly known as:  PULMICORT  Take 2 mLs (0.25 mg total) by nebulization 2 (two) times daily.     calcium acetate 667 MG capsule  Commonly known as:  PHOSLO  Take 667 mg by mouth 3 (three) times daily with meals.     insulin glargine 100 UNIT/ML injection  Commonly known as:  LANTUS  Inject 0.15 mLs (15 Units total) into the skin at bedtime.     isosorbide mononitrate 120 MG 24 hr tablet  Commonly known as:  IMDUR  Take 120 mg by mouth daily.     levothyroxine 25 MCG tablet  Commonly known as:  SYNTHROID, LEVOTHROID  Take 25 mcg by mouth daily.     metoprolol tartrate 25 MG tablet  Commonly known as:  LOPRESSOR  Take 12.5 mg by mouth daily.     nitroGLYCERIN 0.4 MG SL tablet  Commonly known as:  NITROSTAT  Place 0.4 mg under  the tongue every 5 (five) minutes as needed. For chest pain.     NOVOLIN R RELION 100 units/mL injection  Generic drug:  insulin regular  Inject 6-10 Units into the skin 3 (three) times daily. Sliding scale     pantoprazole 40 MG tablet  Commonly known as:  PROTONIX  Take 40 mg by mouth daily.     PROBIOTIC PO  Take 1 capsule by mouth daily as needed (digestion).     simvastatin 10 MG tablet  Commonly known as:  ZOCOR  Take 10 mg by mouth every evening.     tiotropium 18 MCG inhalation capsule  Commonly known as:  SPIRIVA  Place 1 capsule (18 mcg total) into inhaler and inhale daily.       Allergies  Allergen Reactions  . Procaine Hcl Nausea And Vomiting and Other (See Comments)    "Pass out"      The results of significant diagnostics from this hospitalization (including imaging, microbiology, ancillary and laboratory) are listed below for reference.    Significant Diagnostic Studies: Dg Chest 2 View  11/08/2013   CLINICAL DATA:  Weakness and hypoxia  EXAM: CHEST  2 VIEW  COMPARISON:  November 07, 2013.  FINDINGS: The lungs  are well-expanded. The pulmonary interstitial markings are less prominent today. The small bilateral pleural effusions are less conspicuous in the hemidiaphragms are better demonstrated. The cardiopericardial silhouette is not enlarged. The pulmonary vascularity is not engorged. The dual lumen dialysis type catheter remains in place with the tip at the junction of the SVC with the right atrium.  IMPRESSION: There is improvement in the appearance of the pulmonary interstitium consistent with resolving interstitial edema. The pleural effusions appear smaller as well.   Electronically Signed   By: David  [place]   On: 11/08/2013 14:09   X-ray Chest Pa And Lateral   10/27/2013   CLINICAL DATA:  Short of breath.  COPD.  EXAM: CHEST  2 VIEW  COMPARISON:  10/26/2013  FINDINGS: Lung base opacity on the right has mildly improved. There is persistent hazy left lung base opacity mostly obscuring the hemidiaphragm. Lung base opacities most likely combination of small effusions and atelectasis.  The lungs are hyperexpanded. The cardiac silhouette is normal in size and configuration. The mediastinum is normal in contour.  Two lumen left internal jugular central venous catheter is stable.  IMPRESSION: 1. Mild improvement in lung aeration at the right base. There is still basilar lung opacity, greater on the left, likely combination of small effusions and atelectasis. No convincing pulmonary edema. 2. Lung hyperexpansion is consistent with COPD.   Electronically Signed   By: Amie Portland M.D.   On: 10/27/2013 08:14   Dg Chest 2 View  10/26/2013   CLINICAL DATA:  Shortness of breath; patient on dialysis.  EXAM: CHEST  2 VIEW  COMPARISON:  Chest radiograph performed 10/19/2013  FINDINGS: Small bilateral pleural effusions are seen, new from the prior study, with bibasilar airspace opacities. Underlying vascular congestion is seen. This raises concern for mild interstitial edema. No pneumothorax is seen.  The cardiomediastinal  silhouette is mildly enlarged. A left-sided dual lumen catheter is noted ending about the cavoatrial junction. No acute osseous abnormalities are seen.  IMPRESSION: Small bilateral pleural effusions with bibasilar airspace opacities. Underlying vascular congestion and mild cardiomegaly. Findings concerning for mild interstitial edema.   Electronically Signed   By: Roanna Raider M.D.   On: 10/26/2013 06:11   Ct Angio Chest Pe W/cm &/or Wo Cm  11/08/2013   CLINICAL DATA:  Chest pain. History of end-stage renal disease and hemodialysis.  EXAM: CT ANGIOGRAPHY CHEST WITH CONTRAST  TECHNIQUE: Multidetector CT imaging of the chest was performed using the standard protocol during bolus administration of intravenous contrast. Multiplanar CT image reconstructions including MIPs were obtained to evaluate the vascular anatomy.  CONTRAST:  100mL OMNIPAQUE IOHEXOL 350 MG/ML SOLN  COMPARISON:  Radiographs today and 11/07/2013.  Chest CT 02/23/2012  FINDINGS: The pulmonary arteries remain well opacified with contrast. There is no evidence of acute pulmonary embolism.  Again demonstrated is extensive atherosclerosis of the aorta, great vessels and coronary arteries. Left IJ dialysis catheters extend into the right atrium. There is stenosis of the right brachiocephalic vein just superior to the SVC. Collateral vessels are opacified within the upper right chest wall. The SVC is patent. The right cardiac chambers are dilated, and there is reflux of contrast into the IVC and hepatic veins.  There has been some improvement in the previously demonstrated prominent lymphoid tissue in the mediastinum and both hilar regions. No progressive adenopathy is demonstrated. There is no significant residual pericardial effusion.  Bilateral pleural effusions have enlarged, now moderate. There is associated subtotal collapse of both lower lobes. The upper and right middle lobes remain aerated with moderate emphysema.  The visualized upper abdomen  has a stable appearance. There are no acute osseous findings.  Review of the MIP images confirms the above findings.  IMPRESSION: 1. No evidence of acute pulmonary embolism. 2. Extensive atherosclerosis. 3. 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 No acute DVT demonstrated. 4. 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 5. Enlarging moderate-sized bilateral pleural effusions with resulting subtotal collapse of both lower lobes. 6. Moderate underlying emphysema.   Electronically Signed   By: Roxy HorsemanBill  Veazey M.D.   On: 11/08/2013 17:45   Dg Chest Port 1 View  11/12/2013   CLINICAL DATA:  Pleural effusions, followup  EXAM: PORTABLE CHEST - 1 VIEW  COMPARISON:  11/10/2013  FINDINGS: Left IJ dialysis catheter tips in the SVC RA junction as before. Normal heart size and vascularity. Resolved right pleural effusion. Improved basilar aeration compatible with resolving atelectasis. No current CHF or pneumonia. No pneumothorax. Trachea midline. Atherosclerosis of the aorta.  IMPRESSION: Resolved pleural effusions and basilar atelectasis.   Electronically Signed   By: Ruel Favorsrevor  Shick M.D.   On: 11/12/2013 08:46   Dg Chest Port 1 View  11/10/2013   CLINICAL DATA:  Status post left thoracentesis.  EXAM: PORTABLE CHEST - 1 VIEW  COMPARISON:  Plain film of the chest 11/10/2013 and CT chest 11/08/2013.  FINDINGS: Left pleural effusion is markedly decreased after thoracentesis. No pneumothorax is identified. Right pleural effusion is unchanged. There is basilar atelectasis. Heart size is mildly enlarged. Dialysis catheter is again noted.  IMPRESSION: Marked decrease in a left pleural effusion after thoracentesis. Negative for pneumothorax.  No change in a small to moderate right pleural effusion.   Electronically Signed   By: Drusilla Kannerhomas  Dalessio M.D.   On: 11/10/2013 11:11   Dg Chest Port 1 View  11/10/2013   CLINICAL DATA:  Dyspnea and effusions  EXAM:  PORTABLE CHEST - 1 VIEW  COMPARISON:  November 08, 2013  FINDINGS: The lungs are reasonably well inflated. Increased density in the mid and lower lung zones has progressed. There are bilateral pleural effusions layering posteriorly which have increased in conspicuity. The pulmonary interstitial markings are more prominent diffusely  today. The cardiac silhouette is normal in size. The central pulmonary vascularity remains prominent. A dual lumen dialysis type catheter is in place via the left internal jugular approach with the distal port lying in the region of the junction of the SVC with the right atrium.  IMPRESSION: The findings are consistent with worsening pulmonary interstitial edema and increasing bilateral pleural effusions. No alveolar infiltrate is demonstrated.   Electronically Signed   By: David  [place]   On: 11/10/2013 07:55   Dg Chest Portable 1 View  11/07/2013   CLINICAL DATA:  Shortness of breath.  COPD  EXAM: PORTABLE CHEST - 1 VIEW  COMPARISON:  10/28/2013  FINDINGS: Stable positioning of dialysis catheter from the left IJ. Chronic cardiomegaly.  Haziness of the lower chest consistent with effusions. There is pulmonary venous congestion. No pneumothorax.  IMPRESSION: 1. Small pleural effusions with basilar atelectasis or consolidation. 2. Pulmonary venous congestion.   Electronically Signed   By: Tiburcio Pea M.D.   On: 11/07/2013 04:59   Dg Chest Port 1 View  10/28/2013   CLINICAL DATA:  Shortness of breath.  EXAM: PORTABLE CHEST - 1 VIEW  COMPARISON:  Chest radiograph October 27, 2013  FINDINGS: Cardiac silhouette is unremarkable, slightly increased central pulmonary vasculature congestion. Mild interstitial prominence with bibasilar airspace opacities, similar. Suspected trace pleural effusions. No pneumothorax.  Dialysis catheter via left internal jugular central venous approach with distal tip projecting cavoatrial junction. Soft tissue planes and included osseous structures are  unremarkable.  IMPRESSION: Mildly worsening central pulmonary vasculature congestion. Mild bibasilar airspace opacities are similar. Suspected small pleural effusions.  No apparent change in dialysis catheter.   Electronically Signed   By: Awilda Metro   On: 10/28/2013 06:21   Dg Chest Portable 1 View  10/19/2013   CLINICAL DATA:  Weakness with shortness of breath.  EXAM: PORTABLE CHEST - 1 VIEW  COMPARISON:  07/26/2013  FINDINGS: Hyperinflated lungs with apical emphysematous change. There is interlobular septal thickening at the bases that is new. No effusion or pneumothorax. Stable heart size and mediastinal contours. Unchanged positioning of dialysis catheter via left IJ approach.  IMPRESSION: Mild pulmonary edema that is superimposed on COPD.   Electronically Signed   By: Tiburcio Pea M.D.   On: 10/19/2013 03:10    Microbiology: Recent Results (from the past 240 hour(s))  BODY FLUID CULTURE     Status: None   Collection Time    11/10/13 10:10 AM      Result Value Range Status   Specimen Description PLEURAL LEFT FLUID   Final   Special Requests NONE   Final   Gram Stain     Final   Value: RARE WBC PRESENT,BOTH PMN AND MONONUCLEAR     NO ORGANISMS SEEN     Performed at [facility]   Culture     Final   Value: NO GROWTH 2 DAYS     Performed at [facility]   Report Status PENDING   Incomplete     Labs: Basic Metabolic Panel:  Recent Labs Lab 11/07/13 2042 11/08/13 0430 11/09/13 1252 11/11/13 0320 11/11/13 1214 11/12/13 0413 11/13/13 0420  NA  --  136* 133* 134* 130* 137 133*  K  --  4.0 3.9 3.6* 3.3* 5.0 5.3  CL  --  92* 89* 93* 90* 97 92*  CO2  --  27 28 25 26 28 26   GLUCOSE  --  86 139* 157* 247* 62* 128*  BUN  --  29* 65* 50* 53* 18 40*  CREATININE 1.56* 2.29* 3.70* 4.67* 4.67* 2.54* 4.07*  CALCIUM  --  8.9 8.3* 8.5 8.6 8.9 8.8  MG 1.9  --   --   --   --   --   --   PHOS 4.4 4.2 4.3  --  3.0 1.7* 3.4   Liver Function Tests:  Recent  Labs Lab 11/08/13 0430 11/09/13 1252 11/10/13 1115 11/11/13 1214 11/12/13 0413 11/13/13 0420  PROT  --   --  6.6  --   --   --   ALBUMIN 3.6 3.1*  --  3.0* 3.1* 3.0*   No results found for this basename: LIPASE, AMYLASE,  in the last 168 hours No results found for this basename: AMMONIA,  in the last 168 hours CBC:  Recent Labs Lab 11/07/13 0455 11/07/13 2042 11/08/13 0430 11/09/13 1244 11/11/13 1214 11/12/13 0413  WBC 14.5* 12.6* 13.5* 13.5* 13.8* 12.6*  NEUTROABS 12.5*  --   --   --   --   --   HGB 10.0* 10.3* 10.5* 10.2* 9.4* 10.1*  HCT 32.0* 31.6* 32.8* 30.5* 29.0* 31.4*  MCV 98.8 97.5 97.3 95.0 95.7 97.5  PLT 240 250 236 258 210 217   Cardiac Enzymes: No results found for this basename: CKTOTAL, CKMB, CKMBINDEX, TROPONINI,  in the last 168 hours BNP: BNP (last 3 results)  Recent Labs  10/26/13 0510 11/07/13 0455  PROBNP 9552.0* 22434.0*   CBG:  Recent Labs Lab 11/12/13 0829 11/12/13 1153 11/12/13 1645 11/12/13 2126 11/13/13 0811  GLUCAP 89 211* 141* 178* 98       Signed:  Flavius Repsher, JAI-GURMUKH  [facility] 11/13/2013, 8:36 AM

## 2013-11-13 NOTE — Progress Notes (Signed)
Subjective:  No co/ for dc home today /hd in am op [facility] Objective Vital signs in last 24 hours: Filed Vitals:   11/12/13 1811 11/12/13 2127 11/13/13 0554 11/13/13 0746  BP: 133/90 136/73 145/78   Pulse: 99 95 104   Temp: 98.4 F (36.9 C) 98.7 F (37.1 C) 98.8 F (37.1 C)   TempSrc: Oral Oral Oral   Resp: 20 20 20    Height:      Weight:  65.545 kg (144 lb 8 oz)    SpO2: 88% 95% 93% 92%   Labs: Basic Metabolic Panel:  Recent Labs Lab 11/11/13 1214 11/12/13 0413 11/13/13 0420  NA 130* 137 133*  K 3.3* 5.0 5.3  CL 90* 97 92*  CO2 26 28 26   GLUCOSE 247* 62* 128*  BUN 53* 18 40*  CREATININE 4.67* 2.54* 4.07*  CALCIUM 8.6 8.9 8.8  PHOS 3.0 1.7* 3.4   Liver Function Tests:  Recent Labs Lab 11/10/13 1115 11/11/13 1214 11/12/13 0413 11/13/13 0420  PROT 6.6  --   --   --   ALBUMIN  --  3.0* 3.1* 3.0*  CBC:  Recent Labs Lab 11/07/13 0455 11/07/13 2042 11/08/13 0430 11/09/13 1244 11/11/13 1214 11/12/13 0413  WBC 14.5* 12.6* 13.5* 13.5* 13.8* 12.6*  NEUTROABS 12.5*  --   --   --   --   --   HGB 10.0* 10.3* 10.5* 10.2* 9.4* 10.1*  HCT 32.0* 31.6* 32.8* 30.5* 29.0* 31.4*  MCV 98.8 97.5 97.3 95.0 95.7 97.5  PLT 240 250 236 258 210 217   CBG:  Recent Labs Lab 11/12/13 0829 11/12/13 1153 11/12/13 1645 11/12/13 2126 11/13/13 0811  GLUCAP 89 211* 141* 178* 98    Studies/Results: Dg Chest Port 1 View  11/12/2013   CLINICAL DATA:  Pleural effusions, followup  EXAM: PORTABLE CHEST - 1 VIEW  COMPARISON:  11/10/2013  FINDINGS: Left IJ dialysis catheter tips in the SVC RA junction as before. Normal heart size and vascularity. Resolved right pleural effusion. Improved basilar aeration compatible with resolving atelectasis. No current CHF or pneumonia. No pneumothorax. Trachea midline. Atherosclerosis of the aorta.  IMPRESSION: Resolved pleural effusions and basilar atelectasis.   Electronically Signed   By: Ruel Favors M.D.   On: 11/12/2013 08:46    Medications:   . aspirin EC  81 mg Oral Daily  . budesonide  0.25 mg Nebulization BID  . [START ON 11/16/2013] darbepoetin  40 mcg Intravenous Q Wed-HD  . doxercalciferol  2 mcg Intravenous Q M,W,F-HD  . heparin  5,000 Units Subcutaneous Q8H  . insulin aspart  0-9 Units Subcutaneous TID WC  . insulin aspart  3 Units Subcutaneous TID WC  . insulin detemir  15 Units Subcutaneous QHS  . isosorbide mononitrate  120 mg Oral Daily  . levalbuterol  0.63 mg Nebulization QID  . levothyroxine  25 mcg Oral QAC breakfast  . metoprolol tartrate  12.5 mg Oral Daily  . pantoprazole  40 mg Oral Daily  . simvastatin  10 mg Oral QPM  . sodium chloride  3 mL Intravenous Q12H  . tiotropium  18 mcg Inhalation Daily  Physical Exam:  General: Alert nad  Heart: RRR, no rub or mur  Lungs: Decr BS bilat /and clear Abdomen: soft nt  Extremities: Dialysis Access: No pedal edema/ L IJ Perm cath  Dialysis OP Orders=  [facility] on MWF .  4h 67kg-->lowered to 65.5  F160 Bath 2K/2.5Ca Heparin none L IJ TDC BFR 400 UF profile  4  Hectorol 2 EPO 5000 Venofer 50/wk   ASSESSMENT/RECOMMENDATIONS  1. Dyspnea / underlying COPD /compounded by vol excess/bilateral effusions  S/p left thoracentesis with improvement in exam and breathing;CXR post showed resolution of effusion/no chf/ Lowered EDW to 65.5 kg with dialysis ; no growth 2 days/ awaiting final cultures on pleural fluid ("pseudoexudative" ) now off doxycyline  2. LV dysfunction with WMA's by ECHO (LVEF 30-35%) likely compounds issues with drops in BP on HD  3. ESRD: HD MWF( prior op EDW 67)  With last wt 65.5 kg / K 5 yest. to 5.3 today 2 day post HD (3.3 k pre last hd fri and done on 4K bath)  dw pt low k diet / fu k in [facility] labs 4. Anemia / CKD: Last Hb 9.4> 10.1 , cont esa  5. MBD / CKD: cont vit D, phoslo 1 ac, last pth 334 / phos 3.7> 1.7>3.4 Held binder restart  6. HTN/volume: Norvasc stopped; metoprolol, under edw/ new edw= 65.5 vol control with  HD  7. Multiple access failures: catheter-dependent  8. Dispo - DC today  Lenny Pastelavid Zeyfang, PA-C [facility] [phone] 11/13/2013,8:30 AM  LOS: 6 days  I have seen and examined this patient and agree with plan as above. Patient to be discharged today.  EDW will be lowered to 66(2 weights after last HD around 65.5 in pajamas so 66 with clothing. Royalty Domagala B,MD 11/13/2013 12:09 PM

## 2013-11-29 ENCOUNTER — Encounter (HOSPITAL_BASED_OUTPATIENT_CLINIC_OR_DEPARTMENT_OTHER): Payer: Medicare Other

## 2013-12-07 ENCOUNTER — Encounter (HOSPITAL_COMMUNITY): Payer: Self-pay | Admitting: General Practice

## 2013-12-07 ENCOUNTER — Observation Stay (HOSPITAL_COMMUNITY)
Admission: AD | Admit: 2013-12-07 | Discharge: 2013-12-08 | Disposition: A | Discharge status: Home / Self Care | Payer: Medicare Other | Source: Other Acute Inpatient Hospital | Attending: Internal Medicine | Admitting: Internal Medicine

## 2013-12-07 DIAGNOSIS — I10 Essential (primary) hypertension: Secondary | ICD-10-CM

## 2013-12-07 DIAGNOSIS — E039 Hypothyroidism, unspecified: Secondary | ICD-10-CM

## 2013-12-07 DIAGNOSIS — I2510 Atherosclerotic heart disease of native coronary artery without angina pectoris: Secondary | ICD-10-CM

## 2013-12-07 DIAGNOSIS — R252 Cramp and spasm: Secondary | ICD-10-CM

## 2013-12-07 DIAGNOSIS — L89609 Pressure ulcer of unspecified heel, unspecified stage: Secondary | ICD-10-CM | POA: Insufficient documentation

## 2013-12-07 DIAGNOSIS — R0902 Hypoxemia: Secondary | ICD-10-CM

## 2013-12-07 DIAGNOSIS — N1330 Unspecified hydronephrosis: Secondary | ICD-10-CM

## 2013-12-07 DIAGNOSIS — K219 Gastro-esophageal reflux disease without esophagitis: Secondary | ICD-10-CM | POA: Insufficient documentation

## 2013-12-07 DIAGNOSIS — Z86718 Personal history of other venous thrombosis and embolism: Secondary | ICD-10-CM

## 2013-12-07 DIAGNOSIS — J90 Pleural effusion, not elsewhere classified: Secondary | ICD-10-CM

## 2013-12-07 DIAGNOSIS — E7800 Pure hypercholesterolemia, unspecified: Secondary | ICD-10-CM

## 2013-12-07 DIAGNOSIS — Z794 Long term (current) use of insulin: Secondary | ICD-10-CM | POA: Insufficient documentation

## 2013-12-07 DIAGNOSIS — N390 Urinary tract infection, site not specified: Secondary | ICD-10-CM

## 2013-12-07 DIAGNOSIS — J438 Other emphysema: Secondary | ICD-10-CM | POA: Insufficient documentation

## 2013-12-07 DIAGNOSIS — T82898A Other specified complication of vascular prosthetic devices, implants and grafts, initial encounter: Secondary | ICD-10-CM

## 2013-12-07 DIAGNOSIS — I739 Peripheral vascular disease, unspecified: Secondary | ICD-10-CM

## 2013-12-07 DIAGNOSIS — R200 Anesthesia of skin: Secondary | ICD-10-CM

## 2013-12-07 DIAGNOSIS — J441 Chronic obstructive pulmonary disease with (acute) exacerbation: Secondary | ICD-10-CM

## 2013-12-07 DIAGNOSIS — K529 Noninfective gastroenteritis and colitis, unspecified: Secondary | ICD-10-CM

## 2013-12-07 DIAGNOSIS — I519 Heart disease, unspecified: Secondary | ICD-10-CM

## 2013-12-07 DIAGNOSIS — E875 Hyperkalemia: Secondary | ICD-10-CM

## 2013-12-07 DIAGNOSIS — N200 Calculus of kidney: Secondary | ICD-10-CM

## 2013-12-07 DIAGNOSIS — E871 Hypo-osmolality and hyponatremia: Secondary | ICD-10-CM

## 2013-12-07 DIAGNOSIS — Z992 Dependence on renal dialysis: Secondary | ICD-10-CM | POA: Insufficient documentation

## 2013-12-07 DIAGNOSIS — J9600 Acute respiratory failure, unspecified whether with hypoxia or hypercapnia: Principal | ICD-10-CM

## 2013-12-07 DIAGNOSIS — R748 Abnormal levels of other serum enzymes: Secondary | ICD-10-CM

## 2013-12-07 DIAGNOSIS — J9601 Acute respiratory failure with hypoxia: Secondary | ICD-10-CM | POA: Diagnosis present

## 2013-12-07 DIAGNOSIS — J189 Pneumonia, unspecified organism: Secondary | ICD-10-CM

## 2013-12-07 DIAGNOSIS — E1129 Type 2 diabetes mellitus with other diabetic kidney complication: Secondary | ICD-10-CM | POA: Diagnosis present

## 2013-12-07 DIAGNOSIS — Z87891 Personal history of nicotine dependence: Secondary | ICD-10-CM | POA: Insufficient documentation

## 2013-12-07 DIAGNOSIS — I509 Heart failure, unspecified: Secondary | ICD-10-CM

## 2013-12-07 DIAGNOSIS — I209 Angina pectoris, unspecified: Secondary | ICD-10-CM | POA: Insufficient documentation

## 2013-12-07 DIAGNOSIS — I120 Hypertensive chronic kidney disease with stage 5 chronic kidney disease or end stage renal disease: Secondary | ICD-10-CM | POA: Insufficient documentation

## 2013-12-07 DIAGNOSIS — I252 Old myocardial infarction: Secondary | ICD-10-CM | POA: Insufficient documentation

## 2013-12-07 DIAGNOSIS — N186 End stage renal disease: Secondary | ICD-10-CM

## 2013-12-07 DIAGNOSIS — B351 Tinea unguium: Secondary | ICD-10-CM

## 2013-12-07 DIAGNOSIS — J9620 Acute and chronic respiratory failure, unspecified whether with hypoxia or hypercapnia: Secondary | ICD-10-CM

## 2013-12-07 DIAGNOSIS — Z7982 Long term (current) use of aspirin: Secondary | ICD-10-CM | POA: Insufficient documentation

## 2013-12-07 DIAGNOSIS — R202 Paresthesia of skin: Secondary | ICD-10-CM

## 2013-12-07 DIAGNOSIS — Z9981 Dependence on supplemental oxygen: Secondary | ICD-10-CM | POA: Insufficient documentation

## 2013-12-07 DIAGNOSIS — R7881 Bacteremia: Secondary | ICD-10-CM

## 2013-12-07 DIAGNOSIS — E119 Type 2 diabetes mellitus without complications: Secondary | ICD-10-CM

## 2013-12-07 DIAGNOSIS — Z86711 Personal history of pulmonary embolism: Secondary | ICD-10-CM

## 2013-12-07 DIAGNOSIS — L8993 Pressure ulcer of unspecified site, stage 3: Secondary | ICD-10-CM | POA: Insufficient documentation

## 2013-12-07 DIAGNOSIS — D649 Anemia, unspecified: Secondary | ICD-10-CM

## 2013-12-07 HISTORY — DX: Heart failure, unspecified: I50.9

## 2013-12-07 HISTORY — DX: Type 2 diabetes mellitus without complications: E11.9

## 2013-12-07 HISTORY — DX: Dependence on supplemental oxygen: Z99.81

## 2013-12-07 HISTORY — DX: Acute embolism and thrombosis of unspecified deep veins of unspecified lower extremity: I82.409

## 2013-12-07 LAB — GLUCOSE, CAPILLARY
GLUCOSE-CAPILLARY: 151 mg/dL — AB (ref 70–99)
GLUCOSE-CAPILLARY: 160 mg/dL — AB (ref 70–99)

## 2013-12-07 LAB — MRSA PCR SCREENING: MRSA by PCR: NEGATIVE

## 2013-12-07 MED ORDER — SIMVASTATIN 10 MG PO TABS
10.0000 mg | ORAL_TABLET | Freq: Every evening | ORAL | Status: DC
  Administered 2013-12-07: 10 mg via ORAL
  Filled 2013-12-07 (×2): qty 1

## 2013-12-07 MED ORDER — SODIUM CHLORIDE 0.9 % IJ SOLN
3.0000 mL | Freq: Two times a day (BID) | INTRAMUSCULAR | Status: DC
  Administered 2013-12-07 – 2013-12-08 (×2): 3 mL via INTRAVENOUS

## 2013-12-07 MED ORDER — INSULIN ASPART 100 UNIT/ML ~~LOC~~ SOLN: 0.0000 [IU] | Freq: Every day | SUBCUTANEOUS | Status: DC

## 2013-12-07 MED ORDER — HEPARIN SODIUM (PORCINE) 5000 UNIT/ML IJ SOLN
5000.0000 [IU] | Freq: Three times a day (TID) | INTRAMUSCULAR | Status: DC
  Administered 2013-12-07 – 2013-12-08 (×3): 5000 [IU] via SUBCUTANEOUS
  Filled 2013-12-07 (×5): qty 1

## 2013-12-07 MED ORDER — LEVOTHYROXINE SODIUM 25 MCG PO TABS
25.0000 ug | ORAL_TABLET | Freq: Every day | ORAL | Status: DC
  Administered 2013-12-08: 25 ug via ORAL
  Filled 2013-12-07 (×2): qty 1

## 2013-12-07 MED ORDER — BUDESONIDE 0.25 MG/2ML IN SUSP
0.2500 mg | Freq: Two times a day (BID) | RESPIRATORY_TRACT | Status: DC
  Administered 2013-12-07 – 2013-12-08 (×2): 0.25 mg via RESPIRATORY_TRACT
  Filled 2013-12-07 (×3): qty 2

## 2013-12-07 MED ORDER — CALCIUM ACETATE 667 MG PO CAPS
667.0000 mg | ORAL_CAPSULE | Freq: Three times a day (TID) | ORAL | Status: DC
  Administered 2013-12-08: 667 mg via ORAL
  Filled 2013-12-07 (×4): qty 1

## 2013-12-07 MED ORDER — ONDANSETRON HCL 4 MG/2ML IJ SOLN: 4.0000 mg | Freq: Four times a day (QID) | INTRAMUSCULAR | Status: DC | PRN

## 2013-12-07 MED ORDER — SODIUM CHLORIDE 0.9 % IV SOLN: 250.0000 mL | INTRAVENOUS | Status: DC | PRN

## 2013-12-07 MED ORDER — SODIUM CHLORIDE 0.9 % IJ SOLN: 3.0000 mL | Freq: Two times a day (BID) | INTRAMUSCULAR | Status: DC

## 2013-12-07 MED ORDER — ONDANSETRON HCL 4 MG PO TABS: 4.0000 mg | ORAL_TABLET | Freq: Four times a day (QID) | ORAL | Status: DC | PRN

## 2013-12-07 MED ORDER — TIOTROPIUM BROMIDE MONOHYDRATE 18 MCG IN CAPS
18.0000 ug | ORAL_CAPSULE | Freq: Every day | RESPIRATORY_TRACT | Status: DC
  Administered 2013-12-07 – 2013-12-08 (×2): 18 ug via RESPIRATORY_TRACT
  Filled 2013-12-07: qty 5

## 2013-12-07 MED ORDER — METOPROLOL TARTRATE 12.5 MG HALF TABLET
12.5000 mg | ORAL_TABLET | Freq: Every day | ORAL | Status: DC
  Administered 2013-12-08: 12.5 mg via ORAL
  Filled 2013-12-07 (×2): qty 1

## 2013-12-07 MED ORDER — PANTOPRAZOLE SODIUM 40 MG PO TBEC
40.0000 mg | DELAYED_RELEASE_TABLET | Freq: Every day | ORAL | Status: DC
  Administered 2013-12-07 – 2013-12-08 (×2): 40 mg via ORAL
  Filled 2013-12-07 (×2): qty 1

## 2013-12-07 MED ORDER — INSULIN ASPART 100 UNIT/ML ~~LOC~~ SOLN
0.0000 [IU] | Freq: Three times a day (TID) | SUBCUTANEOUS | Status: DC
  Administered 2013-12-08 (×2): 1 [IU] via SUBCUTANEOUS

## 2013-12-07 MED ORDER — INSULIN GLARGINE 100 UNIT/ML ~~LOC~~ SOLN
15.0000 [IU] | Freq: Every day | SUBCUTANEOUS | Status: DC
  Administered 2013-12-07: 15 [IU] via SUBCUTANEOUS
  Filled 2013-12-07 (×2): qty 0.15

## 2013-12-07 MED ORDER — SODIUM CHLORIDE 0.9 % IJ SOLN
3.0000 mL | INTRAMUSCULAR | Status: DC | PRN
Start: 2013-12-07 — End: 2013-12-08

## 2013-12-07 MED ORDER — ISOSORBIDE MONONITRATE ER 60 MG PO TB24
120.0000 mg | ORAL_TABLET | Freq: Every day | ORAL | Status: DC
  Administered 2013-12-07 – 2013-12-08 (×2): 120 mg via ORAL
  Filled 2013-12-07 (×2): qty 2

## 2013-12-07 MED ORDER — ALBUTEROL SULFATE (2.5 MG/3ML) 0.083% IN NEBU: 2.5000 mg | INHALATION_SOLUTION | RESPIRATORY_TRACT | Status: DC | PRN

## 2013-12-07 MED ORDER — ASPIRIN EC 81 MG PO TBEC
81.0000 mg | DELAYED_RELEASE_TABLET | Freq: Every day | ORAL | Status: DC
  Administered 2013-12-07 – 2013-12-08 (×2): 81 mg via ORAL
  Filled 2013-12-07 (×2): qty 1

## 2013-12-07 NOTE — Progress Notes (Addendum)
12/07/2012 Patient transfer from [facility] Emergency Room to [facility] at 1645. She is alert, oriented and stated she can ambulate. Patient have bruises on arms and discoloration on legs. On left heel she have a unstage area and it is 2.0x1.5x0.3. The wound have minimal tan drainage and it look pale. She stated she normally go to the wound center, also stated she been using santyl on wound. Patient was place on telemetry. [facility] RN.

## 2013-12-07 NOTE — H&P (Signed)
[facility] History and Physical  ELENER MUNUZ WFU:[id number] DOB: [birth year] DOA: 12/07/2013  Referring physician: [facility] PCP: Quentin Mulling, MD   Chief Complaint: leg pain  HPI: [name] [name] is a 73 y.o. female  Who was in dialysis today when she began having severe leg pain.  She was able to get 2 hours of the dialysis.  Leg pain has resolved.  When she arrived at [facility] ER, she was found to be hypoxic (78% on 2L- her usual dose per ER- our records indicate it is 3L).  She was also tachycardic at 122.  Patient denies any SOB, cough, congestion, fever.  Her ONLY complaint was the leg pain.    In the  ER at [facility], she was found to have a mild WBC count 11 and prob atelectasis/pNA not excluded on chest x ray, she was sent to [facility] as [facility] can not provide dialysis    Patient has had multiple hospitalizations in last few months 1/5-1/11 PNA/CHF 12/24-12/29- COPD exacerbation  EDW for dialysis is 66  Review of Systems:  All systems reviewed, negative unless stated above    Past Medical History  Diagnosis Date  . Diabetes mellitus   . Hypercholesterolemia   . Hypothyroidism   . Colitis   . Coronary artery disease   . Hypertension   . GERD (gastroesophageal reflux disease)   . Myocardial infarction   . Peripheral vascular disease   . Pneumonia   . Angina   . H/O Clostridium difficile infection   . Hx of pulmonary embolus   . COPD (chronic obstructive pulmonary disease)   . Shortness of breath     uses O2 as needed  . Chronic kidney disease     M-W-F   [place]  . Critical lower limb ischemia   . Claudication    Past Surgical History  Procedure Laterality Date  . Tubal ligation    . Amputaton of right fifth toe    . Tonsillectomy    . Insertion of dialysis catheter    . Av fistula placement  [id number]    right RUE AVF  . Bvt  May 18, 2012    First stage, Basilic Vein Transposition  . Bvt  07/27/12    Left arm Basilic Vein Transposition  . Arch  aortogram  08/19/2012    Procedure: ARCH AORTOGRAM;  Surgeon: Fransisco Hertz, MD;  Location: [facility] OR;  Service: Vascular;  Laterality: N/A;  . Insertion of dialysis catheter  08/21/2012    Procedure: INSERTION OF DIALYSIS CATHETER;  Surgeon: Sherren Kerns, MD;  Location: [facility] OR;  Service: Vascular;  Laterality: Left;  internal jugular   Social History:  reports that she quit smoking about 2 years ago. Her smoking use included Cigarettes. She has a 40 pack-year smoking history. She has never used smokeless tobacco. She reports that she does not drink alcohol or use illicit drugs.  Allergies  Allergen Reactions  . Procaine Hcl Nausea And Vomiting and Other (See Comments)    "Pass out"    Family History  Problem Relation Age of Onset  . Diabetes type II    . Hypertension    . Diabetes Mother   . Kidney disease Mother   . Diabetes Father   . Anesthesia problems Neg Hx      Prior to Admission medications   Medication Sig Start Date End Date Taking? Authorizing Provider  albuterol (PROVENTIL) (5 MG/ML) 0.5% nebulizer solution Take 0.5 mLs (2.5 mg total) by  nebulization 3 (three) times daily. 10/31/13   Jerald Kief, MD  aspirin EC 81 MG tablet Take 81 mg by mouth daily.    Historical Provider, MD  budesonide (PULMICORT) 0.25 MG/2ML nebulizer solution Take 2 mLs (0.25 mg total) by nebulization 2 (two) times daily. 10/31/13   Jerald Kief, MD  calcium acetate (PHOSLO) 667 MG capsule Take 667 mg by mouth 3 (three) times daily with meals.    Historical Provider, MD  insulin glargine (LANTUS) 100 UNIT/ML injection Inject 0.15 mLs (15 Units total) into the skin at bedtime. 11/13/13   Rhetta Mura, MD  isosorbide mononitrate (IMDUR) 120 MG 24 hr tablet Take 120 mg by mouth daily.     Historical Provider, MD  levothyroxine (SYNTHROID, LEVOTHROID) 25 MCG tablet Take 25 mcg by mouth daily.     Historical Provider, MD  metoprolol tartrate (LOPRESSOR) 25 MG tablet Take 12.5 mg by mouth daily.     Historical Provider, MD  nitroGLYCERIN (NITROSTAT) 0.4 MG SL tablet Place 0.4 mg under the tongue every 5 (five) minutes as needed. For chest pain.    Historical Provider, MD  NOVOLIN R RELION 100 UNIT/ML injection Inject 6-10 Units into the skin 3 (three) times daily. Sliding scale 10/15/13   Historical Provider, MD  pantoprazole (PROTONIX) 40 MG tablet Take 40 mg by mouth daily.     Historical Provider, MD  Probiotic Product (PROBIOTIC PO) Take 1 capsule by mouth daily as needed (digestion).     Historical Provider, MD  simvastatin (ZOCOR) 10 MG tablet Take 10 mg by mouth every evening.    Historical Provider, MD  tiotropium (SPIRIVA) 18 MCG inhalation capsule Place 1 capsule (18 mcg total) into inhaler and inhale daily. 11/13/13   Rhetta Mura, MD   Physical Exam: There were no vitals filed for this visit.  There were no vitals taken for this visit.  General:  Appears calm and comfortable- on 4L [place] Eyes: PERRL, normal lids, irises & conjunctiva ENT: grossly normal hearing, lips & tongue Neck: no LAD, masses or thyromegaly Cardiovascular: RRR, no m/r/g. No LE edema. Respiratory: CTA bilaterally, no w/r/r. Normal respiratory effort mild decrease at bases Abdomen: soft, ntnd Skin: left heel with clean appearing wound Musculoskeletal: grossly normal tone BUE/BLE Psychiatric: grossly normal mood and affect, speech fluent and appropriate Neurologic: grossly non-focal.          Labs on Admission:  Basic Metabolic Panel: No results found for this basename: NA, K, CL, CO2, GLUCOSE, BUN, CREATININE, CALCIUM, MG, PHOS,  in the last 168 hours Liver Function Tests: No results found for this basename: AST, ALT, ALKPHOS, BILITOT, PROT, ALBUMIN,  in the last 168 hours No results found for this basename: LIPASE, AMYLASE,  in the last 168 hours No results found for this basename: AMMONIA,  in the last 168 hours CBC: No results found for this basename: WBC, NEUTROABS, HGB, HCT, MCV, PLT,   in the last 168 hours Cardiac Enzymes: No results found for this basename: CKTOTAL, CKMB, CKMBINDEX, TROPONINI,  in the last 168 hours  BNP (last 3 results)  Recent Labs  10/26/13 0510 11/07/13 0455  PROBNP 9552.0* 22434.0*   CBG: No results found for this basename: GLUCAP,  in the last 168 hours  Radiological Exams on Admission: No results found.    Assessment/Plan Active Problems:   ESRD (end stage renal disease) on dialysis   Diabetes mellitus   Acute respiratory failure with hypoxia   Pleural effusion   1. ?acute resp  failure on chronic disease- 3L appears to be home O2 2. ?PNA vs atelectasis not convinced she has a PNA- will hold off on abx- added incentive spirometry- does appear to have some volume overload EF is reduced at 30-35% and patient has pleural effusions- recheck echo as last admission was reduced severly 3. B/l leg and back pain- resolved 4. ESRD- HD M/W/F, nephrology consulted; EDW- 66 5. DM- SSI, home lantus 6. pleural effusions- last admission, had thoracentesis 7. Left heel wound- wound care- appears clean and not infected  Nephrology for dialysis  Code Status: full Family Communication: patient, no family at bedside Disposition Plan: suspect home soon  Time spent: 75 min  Marlin CanaryVANN, Adryan Druckenmiller [facility] Pager [phone]

## 2013-12-08 DIAGNOSIS — E119 Type 2 diabetes mellitus without complications: Secondary | ICD-10-CM

## 2013-12-08 DIAGNOSIS — N186 End stage renal disease: Secondary | ICD-10-CM | POA: Diagnosis present

## 2013-12-08 DIAGNOSIS — D649 Anemia, unspecified: Secondary | ICD-10-CM

## 2013-12-08 LAB — CBC
HCT: 35.6 % — ABNORMAL LOW (ref 36.0–46.0)
Hemoglobin: 10.7 g/dL — ABNORMAL LOW (ref 12.0–15.0)
MCH: 29.2 pg (ref 26.0–34.0)
MCHC: 30.1 g/dL (ref 30.0–36.0)
MCV: 97.3 fL (ref 78.0–100.0)
PLATELETS: 224 10*3/uL (ref 150–400)
RBC: 3.66 MIL/uL — ABNORMAL LOW (ref 3.87–5.11)
RDW: 16.5 % — AB (ref 11.5–15.5)
WBC: 8.7 10*3/uL (ref 4.0–10.5)

## 2013-12-08 LAB — BASIC METABOLIC PANEL
BUN: 21 mg/dL (ref 6–23)
CALCIUM: 8.3 mg/dL — AB (ref 8.4–10.5)
CO2: 25 mEq/L (ref 19–32)
CREATININE: 3.15 mg/dL — AB (ref 0.50–1.10)
Chloride: 97 mEq/L (ref 96–112)
GFR, EST AFRICAN AMERICAN: 16 mL/min — AB (ref >90)
GFR, EST NON AFRICAN AMERICAN: 14 mL/min — AB (ref >90)
Glucose, Bld: 112 mg/dL — ABNORMAL HIGH (ref 70–99)
Potassium: 3.7 mEq/L (ref 3.7–5.3)
Sodium: 139 mEq/L (ref 137–147)

## 2013-12-08 LAB — GLUCOSE, CAPILLARY: Glucose-Capillary: 121 mg/dL — ABNORMAL HIGH (ref 70–99)

## 2013-12-08 NOTE — Discharge Summary (Signed)
Physician Discharge Summary  [name] [name] ZOX:[id number] DOB: 04/29/1941 DOA: 12/07/2013  PCP: [name] [name]  Admit date: 12/07/2013 Discharge date: 12/08/2013  Time spent: 35 minutes  Recommendations for Outpatient Follow-up:  1. Resume home O2 2. Resume HD  Discharge Diagnoses:  Active Problems:   ESRD (end stage renal disease) on dialysis   Diabetes mellitus   Acute respiratory failure with hypoxia   Pleural effusion   Hypoxia   ESRD (end stage renal disease)   Discharge Condition: improved  Diet recommendation: renal/diabetic  Filed Weights   12/07/13 1709 12/07/13 2143  Weight: 66.452 kg (146 lb 8 oz) 66.089 kg (145 lb 11.2 oz)    History of present illness:  [name] [name] is a 73 y.o. female  Who was in dialysis today when she began having severe leg pain. She was able to get 2 hours of the dialysis. Leg pain has resolved. When she arrived at [facility] ER, she was found to be hypoxic (78% on 2L- her usual dose per ER- our records indicate it is 3L). She was also tachycardic at 122. Patient denies any SOB, cough, congestion, fever. Her ONLY complaint was the leg pain.  In the ER [facility], she was found to have a mild WBC count 11 and prob atelectasis/pNA not excluded on chest x ray, [facility]  Patient has had multiple hospitalizations in last few months  1/5-1/11 PNA/CHF  12/24-12/29- COPD exacerbation  EDW for dialysis is 7066   Hospital Course:  ?acute resp failure on chronic disease- 3L appears to be home O2 amount, no breathing difficulties, no fever   ?PNA vs atelectasis not convinced she has a PNA- will hold off on abx- added incentive spirometry- does appear to have some volume overload EF is reduced at 30-35% and patient has pleural effusions- will need continued dialysis  severly B/l leg and back pain- resolved- appeared to be related to cramping with dialysis--- this was patient's main complaint   ESRD-  HD M/W/F  DM- SSI, home lantus   pleural effusions- last admission, had thoracentesis   Left heel wound- wound care- has appointment with wound care clinic feb 10th   Procedures:  none  Consultations:  none  Discharge Exam: Filed Vitals:   12/08/13 0835  BP: 129/51  Pulse: 90  Temp: 97.5 F (36.4 C)  Resp: 18    General: A+Ox3, NAD- back to baseline- thinks her leg pain was related to cramping in dialysis Cardiovascular: rrr Respiratory: clear anterior, no wheezing  Discharge Instructions      Discharge Orders   Future Appointments Provider Department Dept Phone   12/13/2013 10:00 AM [facility] [phone]   Future Orders Complete By Expires   Discharge instructions  As directed    Comments:     Resume HD Home O2 3-4 L as before admission Renal diet   Increase activity slowly  As directed        Medication List         albuterol (5 MG/ML) 0.5% nebulizer solution  Commonly known as:  PROVENTIL  Take 0.5 mLs (2.5 mg total) by nebulization 3 (three) times daily.     aspirin EC 81 MG tablet  Take 81 mg by mouth daily.     budesonide 0.25 MG/2ML nebulizer solution  Commonly known as:  PULMICORT  Take 2 mLs (0.25 mg total) by nebulization 2 (two) times  daily.     calcium acetate 667 MG capsule  Commonly known as:  PHOSLO  Take 667 mg by mouth 3 (three) times daily with meals.     insulin glargine 100 UNIT/ML injection  Commonly known as:  LANTUS  Inject 0.15 mLs (15 Units total) into the skin at bedtime.     isosorbide mononitrate 120 MG 24 hr tablet  Commonly known as:  IMDUR  Take 120 mg by mouth daily.     levothyroxine 25 MCG tablet  Commonly known as:  SYNTHROID, LEVOTHROID  Take 25 mcg by mouth daily.     metoprolol tartrate 25 MG tablet  Commonly known as:  LOPRESSOR  Take 12.5 mg by mouth daily.     nitroGLYCERIN 0.4 MG SL tablet  Commonly known as:  NITROSTAT  Place 0.4 mg  under the tongue every 5 (five) minutes as needed. For chest pain.     NOVOLIN R RELION 100 units/mL injection  Generic drug:  insulin regular  Inject 6-10 Units into the skin 3 (three) times daily. Sliding scale     pantoprazole 40 MG tablet  Commonly known as:  PROTONIX  Take 40 mg by mouth daily.     PROBIOTIC PO  Take 1 capsule by mouth daily as needed (digestion).     simvastatin 10 MG tablet  Commonly known as:  ZOCOR  Take 10 mg by mouth every evening.     tiotropium 18 MCG inhalation capsule  Commonly known as:  SPIRIVA  Place 1 capsule (18 mcg total) into inhaler and inhale daily.       Allergies  Allergen Reactions  . Procaine Hcl Nausea And Vomiting and Other (See Comments)    "Pass out"      The results of significant diagnostics from this hospitalization (including imaging, microbiology, ancillary and laboratory) are listed below for reference.    Significant Diagnostic Studies: Dg Chest 2 View  11/08/2013   CLINICAL DATA:  Weakness and hypoxia  EXAM: CHEST  2 VIEW  COMPARISON:  November 07, 2013.  FINDINGS: The lungs are well-expanded. The pulmonary interstitial markings are less prominent today. The small bilateral pleural effusions are less conspicuous in the hemidiaphragms are better demonstrated. The cardiopericardial silhouette is not enlarged. The pulmonary vascularity is not engorged. The dual lumen dialysis type catheter remains in place with the tip at the junction of the SVC with the right atrium.  IMPRESSION: There is improvement in the appearance of the pulmonary interstitium consistent with resolving interstitial edema. The pleural effusions appear smaller as well.   Electronically Signed   By: David  [place]   On: 11/08/2013 14:09   Ct Angio Chest Pe W/cm &/or Wo Cm  11/08/2013   CLINICAL DATA:  Chest pain. History of end-stage renal disease and hemodialysis.  EXAM: CT ANGIOGRAPHY CHEST WITH CONTRAST  TECHNIQUE: Multidetector CT imaging of the chest was  performed using the standard protocol during bolus administration of intravenous contrast. Multiplanar CT image reconstructions including MIPs were obtained to evaluate the vascular anatomy.  CONTRAST:  OMNIPAQUE IOHEXOL 350 MG/ML SOLN  COMPARISON:  Radiographs today and 11/07/2013.  Chest CT 02/23/2012  FINDINGS: The pulmonary arteries remain well opacified with contrast. There is no evidence of acute pulmonary embolism.  Again demonstrated is extensive atherosclerosis of the aorta, great vessels and coronary arteries. Left IJ dialysis catheters extend into the right atrium. There is stenosis of the right brachiocephalic vein just superior to the SVC. Collateral vessels are opacified within the  upper right chest wall. The SVC is patent. The right cardiac chambers are dilated, and there is reflux of contrast into the IVC and hepatic veins.  There has been some improvement in the previously demonstrated prominent lymphoid tissue in the mediastinum and both hilar regions. No progressive adenopathy is demonstrated. There is no significant residual pericardial effusion.  Bilateral pleural effusions have enlarged, now moderate. There is associated subtotal collapse of both lower lobes. The upper and right middle lobes remain aerated with moderate emphysema.  The visualized upper abdomen has a stable appearance. There are no acute osseous findings.  Review of the MIP images confirms the above findings.  IMPRESSION: 1. No evidence of acute pulmonary embolism. 2. Extensive atherosclerosis. 3. 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 No acute DVT demonstrated. 4. 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 5. Enlarging moderate-sized bilateral pleural effusions with resulting subtotal collapse of both lower lobes. 6. Moderate underlying emphysema.   Electronically Signed   By: Roxy Horseman M.D.   On: 11/08/2013 17:45   Dg Chest Port 1  View  11/12/2013   CLINICAL DATA:  Pleural effusions, followup  EXAM: PORTABLE CHEST - 1 VIEW  COMPARISON:  11/10/2013  FINDINGS: Left IJ dialysis catheter tips in the SVC RA junction as before. Normal heart size and vascularity. Resolved right pleural effusion. Improved basilar aeration compatible with resolving atelectasis. No current CHF or pneumonia. No pneumothorax. Trachea midline. Atherosclerosis of the aorta.  IMPRESSION: Resolved pleural effusions and basilar atelectasis.   Electronically Signed   By: Ruel Favors M.D.   On: 11/12/2013 08:46   Dg Chest Port 1 View  11/10/2013   CLINICAL DATA:  Status post left thoracentesis.  EXAM: PORTABLE CHEST - 1 VIEW  COMPARISON:  Plain film of the chest 11/10/2013 and CT chest 11/08/2013.  FINDINGS: Left pleural effusion is markedly decreased after thoracentesis. No pneumothorax is identified. Right pleural effusion is unchanged. There is basilar atelectasis. Heart size is mildly enlarged. Dialysis catheter is again noted.  IMPRESSION: Marked decrease in a left pleural effusion after thoracentesis. Negative for pneumothorax.  No change in a small to moderate right pleural effusion.   Electronically Signed   By: Drusilla Kanner M.D.   On: 11/10/2013 11:11   Dg Chest Port 1 View  11/10/2013   CLINICAL DATA:  Dyspnea and effusions  EXAM: PORTABLE CHEST - 1 VIEW  COMPARISON:  November 08, 2013  FINDINGS: The lungs are reasonably well inflated. Increased density in the mid and lower lung zones has progressed. There are bilateral pleural effusions layering posteriorly which have increased in conspicuity. The pulmonary interstitial markings are more prominent diffusely today. The cardiac silhouette is normal in size. The central pulmonary vascularity remains prominent. A dual lumen dialysis type catheter is in place via the left internal jugular approach with the distal port lying in the region of the junction of the SVC with the right atrium.  IMPRESSION: The findings  are consistent with worsening pulmonary interstitial edema and increasing bilateral pleural effusions. No alveolar infiltrate is demonstrated.   Electronically Signed   By: David  [place]   On: 11/10/2013 07:55    Microbiology: Recent Results (from the past 240 hour(s))  MRSA PCR SCREENING     Status: None   Collection Time    12/07/13  6:36 PM      Result Value Range Status   MRSA by PCR NEGATIVE  NEGATIVE Final  Comment:            The GeneXpert MRSA Assay (FDA     approved for NASAL specimens     only), is one component of a     comprehensive MRSA colonization     surveillance program. It is not     intended to diagnose MRSA     infection nor to guide or     monitor treatment for     MRSA infections.     Labs: Basic Metabolic Panel:  Recent Labs Lab 12/08/13 0500  NA 139  K 3.7  CL 97  CO2 25  GLUCOSE 112*  BUN 21  CREATININE 3.15*  CALCIUM 8.3*   Liver Function Tests: No results found for this basename: AST, ALT, ALKPHOS, BILITOT, PROT, ALBUMIN,  in the last 168 hours No results found for this basename: LIPASE, AMYLASE,  in the last 168 hours No results found for this basename: AMMONIA,  in the last 168 hours CBC:  Recent Labs Lab 12/08/13 0500  WBC 8.7  HGB 10.7*  HCT 35.6*  MCV 97.3  PLT 224   Cardiac Enzymes: No results found for this basename: CKTOTAL, CKMB, CKMBINDEX, TROPONINI,  in the last 168 hours BNP: BNP (last 3 results)  Recent Labs  10/26/13 0510 11/07/13 0455  PROBNP 9552.0* 22434.0*   CBG:  Recent Labs Lab 12/07/13 1811 12/07/13 2138 12/08/13 0837  GLUCAP 151* 160* 121*       Signed:  Benjamine Mola, Jaydee Conran  [facility] 12/08/2013, 11:24 AM

## 2013-12-08 NOTE — Consult Note (Signed)
WOC wound consult note Reason for Consult: Ulcer on left medial heel.  Patient has been followed in the apst by the outpatient wound care center at [facility] but has not been in approximately 6 weeks.  Patient reports that ulcer is much smaller than it was previously. Wound type: Pressure.  Healing Stage III PrU.  Pressure Ulcer POA: Yes Measurement: 0.4cm x 0.8cm x 0.4cm Wound bed: Thin yellow slough at base.  Slough moves away easily with sterile cotton-tip applicator to reveal a pale pink base. Drainage (amount, consistency, odor) scant amount of serous exudate on old dressing Periwound:intact, clear Dressing procedure/placement/frequency:I will implement a twice daily saline dressing while here in hospital.  Additionally, I will provide a pressure redistribution heel boot that will keep her from the medial rotation while at rest that contributed to the development of the ulceration. I expect gradual and slow resolution.  If you agree, refer and encourage patient to return to the outpatient wound care center at [facility] hospital for continued follow up with this healing ulceration for discharge. WOC nursing team will not follow, but will remain available to this patient, the nursing and medical team.  Please re-consult if needed. Thanks, Ladona Mow, MSN, RN, GNP, [place], CWON-AP [phone])

## 2013-12-09 LAB — GLUCOSE, CAPILLARY: Glucose-Capillary: 133 mg/dL — ABNORMAL HIGH (ref 70–99)

## 2013-12-13 ENCOUNTER — Encounter (HOSPITAL_BASED_OUTPATIENT_CLINIC_OR_DEPARTMENT_OTHER): Payer: Medicare Other | Attending: General Surgery

## 2013-12-13 DIAGNOSIS — I739 Peripheral vascular disease, unspecified: Secondary | ICD-10-CM | POA: Insufficient documentation

## 2013-12-13 DIAGNOSIS — N186 End stage renal disease: Secondary | ICD-10-CM | POA: Insufficient documentation

## 2013-12-13 DIAGNOSIS — L97409 Non-pressure chronic ulcer of unspecified heel and midfoot with unspecified severity: Secondary | ICD-10-CM | POA: Insufficient documentation

## 2013-12-13 DIAGNOSIS — E1169 Type 2 diabetes mellitus with other specified complication: Secondary | ICD-10-CM | POA: Insufficient documentation

## 2013-12-26 ENCOUNTER — Inpatient Hospital Stay (HOSPITAL_COMMUNITY)
Admission: EM | Admit: 2013-12-26 | Discharge: 2013-12-30 | DRG: 189 | Disposition: A | Discharge status: Home / Self Care | Payer: Medicare Other | Attending: Internal Medicine | Admitting: Internal Medicine

## 2013-12-26 ENCOUNTER — Encounter (HOSPITAL_COMMUNITY): Payer: Self-pay | Admitting: Emergency Medicine

## 2013-12-26 ENCOUNTER — Emergency Department (HOSPITAL_COMMUNITY): Payer: Medicare Other

## 2013-12-26 DIAGNOSIS — R202 Paresthesia of skin: Secondary | ICD-10-CM

## 2013-12-26 DIAGNOSIS — E039 Hypothyroidism, unspecified: Secondary | ICD-10-CM

## 2013-12-26 DIAGNOSIS — M899 Disorder of bone, unspecified: Secondary | ICD-10-CM | POA: Diagnosis present

## 2013-12-26 DIAGNOSIS — R0902 Hypoxemia: Secondary | ICD-10-CM

## 2013-12-26 DIAGNOSIS — J189 Pneumonia, unspecified organism: Secondary | ICD-10-CM

## 2013-12-26 DIAGNOSIS — Z9981 Dependence on supplemental oxygen: Secondary | ICD-10-CM

## 2013-12-26 DIAGNOSIS — R200 Anesthesia of skin: Secondary | ICD-10-CM

## 2013-12-26 DIAGNOSIS — J9601 Acute respiratory failure with hypoxia: Secondary | ICD-10-CM

## 2013-12-26 DIAGNOSIS — B351 Tinea unguium: Secondary | ICD-10-CM

## 2013-12-26 DIAGNOSIS — I120 Hypertensive chronic kidney disease with stage 5 chronic kidney disease or end stage renal disease: Secondary | ICD-10-CM | POA: Diagnosis present

## 2013-12-26 DIAGNOSIS — K529 Noninfective gastroenteritis and colitis, unspecified: Secondary | ICD-10-CM

## 2013-12-26 DIAGNOSIS — N200 Calculus of kidney: Secondary | ICD-10-CM

## 2013-12-26 DIAGNOSIS — Z86711 Personal history of pulmonary embolism: Secondary | ICD-10-CM

## 2013-12-26 DIAGNOSIS — I428 Other cardiomyopathies: Secondary | ICD-10-CM | POA: Diagnosis present

## 2013-12-26 DIAGNOSIS — I739 Peripheral vascular disease, unspecified: Secondary | ICD-10-CM

## 2013-12-26 DIAGNOSIS — Z66 Do not resuscitate: Secondary | ICD-10-CM | POA: Diagnosis not present

## 2013-12-26 DIAGNOSIS — Z794 Long term (current) use of insulin: Secondary | ICD-10-CM

## 2013-12-26 DIAGNOSIS — J984 Other disorders of lung: Secondary | ICD-10-CM

## 2013-12-26 DIAGNOSIS — N1330 Unspecified hydronephrosis: Secondary | ICD-10-CM

## 2013-12-26 DIAGNOSIS — N039 Chronic nephritic syndrome with unspecified morphologic changes: Secondary | ICD-10-CM

## 2013-12-26 DIAGNOSIS — E785 Hyperlipidemia, unspecified: Secondary | ICD-10-CM | POA: Diagnosis present

## 2013-12-26 DIAGNOSIS — N390 Urinary tract infection, site not specified: Secondary | ICD-10-CM

## 2013-12-26 DIAGNOSIS — L8990 Pressure ulcer of unspecified site, unspecified stage: Secondary | ICD-10-CM | POA: Diagnosis present

## 2013-12-26 DIAGNOSIS — M949 Disorder of cartilage, unspecified: Secondary | ICD-10-CM

## 2013-12-26 DIAGNOSIS — J441 Chronic obstructive pulmonary disease with (acute) exacerbation: Secondary | ICD-10-CM

## 2013-12-26 DIAGNOSIS — J810 Acute pulmonary edema: Secondary | ICD-10-CM | POA: Diagnosis present

## 2013-12-26 DIAGNOSIS — J9620 Acute and chronic respiratory failure, unspecified whether with hypoxia or hypercapnia: Principal | ICD-10-CM

## 2013-12-26 DIAGNOSIS — J90 Pleural effusion, not elsewhere classified: Secondary | ICD-10-CM

## 2013-12-26 DIAGNOSIS — N186 End stage renal disease: Secondary | ICD-10-CM

## 2013-12-26 DIAGNOSIS — E871 Hypo-osmolality and hyponatremia: Secondary | ICD-10-CM

## 2013-12-26 DIAGNOSIS — I10 Essential (primary) hypertension: Secondary | ICD-10-CM

## 2013-12-26 DIAGNOSIS — R7881 Bacteremia: Secondary | ICD-10-CM

## 2013-12-26 DIAGNOSIS — Z833 Family history of diabetes mellitus: Secondary | ICD-10-CM

## 2013-12-26 DIAGNOSIS — Z87891 Personal history of nicotine dependence: Secondary | ICD-10-CM

## 2013-12-26 DIAGNOSIS — J811 Chronic pulmonary edema: Secondary | ICD-10-CM

## 2013-12-26 DIAGNOSIS — Z992 Dependence on renal dialysis: Secondary | ICD-10-CM

## 2013-12-26 DIAGNOSIS — T82898A Other specified complication of vascular prosthetic devices, implants and grafts, initial encounter: Secondary | ICD-10-CM

## 2013-12-26 DIAGNOSIS — I252 Old myocardial infarction: Secondary | ICD-10-CM

## 2013-12-26 DIAGNOSIS — I519 Heart disease, unspecified: Secondary | ICD-10-CM

## 2013-12-26 DIAGNOSIS — J449 Chronic obstructive pulmonary disease, unspecified: Secondary | ICD-10-CM | POA: Diagnosis present

## 2013-12-26 DIAGNOSIS — L89609 Pressure ulcer of unspecified heel, unspecified stage: Secondary | ICD-10-CM | POA: Diagnosis present

## 2013-12-26 DIAGNOSIS — I2510 Atherosclerotic heart disease of native coronary artery without angina pectoris: Secondary | ICD-10-CM

## 2013-12-26 DIAGNOSIS — S98139A Complete traumatic amputation of one unspecified lesser toe, initial encounter: Secondary | ICD-10-CM

## 2013-12-26 DIAGNOSIS — E119 Type 2 diabetes mellitus without complications: Secondary | ICD-10-CM

## 2013-12-26 DIAGNOSIS — N2581 Secondary hyperparathyroidism of renal origin: Secondary | ICD-10-CM | POA: Diagnosis present

## 2013-12-26 DIAGNOSIS — K219 Gastro-esophageal reflux disease without esophagitis: Secondary | ICD-10-CM | POA: Diagnosis present

## 2013-12-26 DIAGNOSIS — I509 Heart failure, unspecified: Secondary | ICD-10-CM

## 2013-12-26 DIAGNOSIS — D649 Anemia, unspecified: Secondary | ICD-10-CM

## 2013-12-26 DIAGNOSIS — E7800 Pure hypercholesterolemia, unspecified: Secondary | ICD-10-CM

## 2013-12-26 DIAGNOSIS — R748 Abnormal levels of other serum enzymes: Secondary | ICD-10-CM

## 2013-12-26 DIAGNOSIS — Z7982 Long term (current) use of aspirin: Secondary | ICD-10-CM

## 2013-12-26 DIAGNOSIS — E1129 Type 2 diabetes mellitus with other diabetic kidney complication: Secondary | ICD-10-CM | POA: Diagnosis present

## 2013-12-26 DIAGNOSIS — E875 Hyperkalemia: Secondary | ICD-10-CM

## 2013-12-26 DIAGNOSIS — D631 Anemia in chronic kidney disease: Secondary | ICD-10-CM | POA: Diagnosis present

## 2013-12-26 DIAGNOSIS — J4489 Other specified chronic obstructive pulmonary disease: Secondary | ICD-10-CM | POA: Diagnosis present

## 2013-12-26 DIAGNOSIS — J9600 Acute respiratory failure, unspecified whether with hypoxia or hypercapnia: Secondary | ICD-10-CM

## 2013-12-26 DIAGNOSIS — Z86718 Personal history of other venous thrombosis and embolism: Secondary | ICD-10-CM

## 2013-12-26 DIAGNOSIS — R0603 Acute respiratory distress: Secondary | ICD-10-CM

## 2013-12-26 LAB — COMPREHENSIVE METABOLIC PANEL
ALT: 37 U/L — AB (ref 0–35)
AST: 52 U/L — ABNORMAL HIGH (ref 0–37)
Albumin: 3.5 g/dL (ref 3.5–5.2)
Alkaline Phosphatase: 125 U/L — ABNORMAL HIGH (ref 39–117)
BUN: 39 mg/dL — ABNORMAL HIGH (ref 6–23)
CALCIUM: 9 mg/dL (ref 8.4–10.5)
CO2: 30 meq/L (ref 19–32)
CREATININE: 3.4 mg/dL — AB (ref 0.50–1.10)
Chloride: 96 mEq/L (ref 96–112)
GFR calc Af Amer: 15 mL/min — ABNORMAL LOW (ref >90)
GFR, EST NON AFRICAN AMERICAN: 13 mL/min — AB (ref >90)
GLUCOSE: 114 mg/dL — AB (ref 70–99)
Potassium: 5.9 mEq/L — ABNORMAL HIGH (ref 3.7–5.3)
SODIUM: 139 meq/L (ref 137–147)
Total Protein: 7.1 g/dL (ref 6.0–8.3)

## 2013-12-26 LAB — I-STAT ARTERIAL BLOOD GAS, ED
ACID-BASE EXCESS: 3 mmol/L — AB (ref 0.0–2.0)
Bicarbonate: 30.4 mEq/L — ABNORMAL HIGH (ref 20.0–24.0)
O2 Saturation: 84 %
PCO2 ART: 56.4 mmHg — AB (ref 35.0–45.0)
Patient temperature: 97.7
TCO2: 32 mmol/L (ref 0–100)
pH, Arterial: 7.337 — ABNORMAL LOW (ref 7.350–7.450)
pO2, Arterial: 51 mmHg — ABNORMAL LOW (ref 80.0–100.0)

## 2013-12-26 LAB — URINE MICROSCOPIC-ADD ON

## 2013-12-26 LAB — CBC WITH DIFFERENTIAL/PLATELET
BASOS ABS: 0.1 10*3/uL (ref 0.0–0.1)
BASOS PCT: 1 % (ref 0–1)
EOS ABS: 0.2 10*3/uL (ref 0.0–0.7)
EOS PCT: 2 % (ref 0–5)
HCT: 40.3 % (ref 36.0–46.0)
Hemoglobin: 12.3 g/dL (ref 12.0–15.0)
Lymphocytes Relative: 10 % — ABNORMAL LOW (ref 12–46)
Lymphs Abs: 1.1 10*3/uL (ref 0.7–4.0)
MCH: 29.9 pg (ref 26.0–34.0)
MCHC: 30.5 g/dL (ref 30.0–36.0)
MCV: 98.1 fL (ref 78.0–100.0)
Monocytes Absolute: 0.7 10*3/uL (ref 0.1–1.0)
Monocytes Relative: 6 % (ref 3–12)
Neutro Abs: 8.6 10*3/uL — ABNORMAL HIGH (ref 1.7–7.7)
Neutrophils Relative %: 81 % — ABNORMAL HIGH (ref 43–77)
PLATELETS: 353 10*3/uL (ref 150–400)
RBC: 4.11 MIL/uL (ref 3.87–5.11)
RDW: 16.9 % — AB (ref 11.5–15.5)
WBC: 10.6 10*3/uL — ABNORMAL HIGH (ref 4.0–10.5)

## 2013-12-26 LAB — CBC
HCT: 35.1 % — ABNORMAL LOW (ref 36.0–46.0)
Hemoglobin: 10.7 g/dL — ABNORMAL LOW (ref 12.0–15.0)
MCH: 29.6 pg (ref 26.0–34.0)
MCHC: 30.5 g/dL (ref 30.0–36.0)
MCV: 97.2 fL (ref 78.0–100.0)
Platelets: 305 10*3/uL (ref 150–400)
RBC: 3.61 MIL/uL — ABNORMAL LOW (ref 3.87–5.11)
RDW: 16.6 % — ABNORMAL HIGH (ref 11.5–15.5)
WBC: 9.7 10*3/uL (ref 4.0–10.5)

## 2013-12-26 LAB — CBG MONITORING, ED: GLUCOSE-CAPILLARY: 129 mg/dL — AB (ref 70–99)

## 2013-12-26 LAB — URINALYSIS, ROUTINE W REFLEX MICROSCOPIC
Bilirubin Urine: NEGATIVE
Glucose, UA: NEGATIVE mg/dL
Hgb urine dipstick: NEGATIVE
Ketones, ur: NEGATIVE mg/dL
Leukocytes, UA: NEGATIVE
NITRITE: NEGATIVE
Protein, ur: 30 mg/dL — AB
SPECIFIC GRAVITY, URINE: 1.01 (ref 1.005–1.030)
Urobilinogen, UA: 0.2 mg/dL (ref 0.0–1.0)
pH: 6 (ref 5.0–8.0)

## 2013-12-26 LAB — RENAL FUNCTION PANEL
ALBUMIN: 3.1 g/dL — AB (ref 3.5–5.2)
BUN: 42 mg/dL — ABNORMAL HIGH (ref 6–23)
CALCIUM: 8.9 mg/dL (ref 8.4–10.5)
CO2: 34 mEq/L — ABNORMAL HIGH (ref 19–32)
Chloride: 98 mEq/L (ref 96–112)
Creatinine, Ser: 3.55 mg/dL — ABNORMAL HIGH (ref 0.50–1.10)
GFR calc non Af Amer: 12 mL/min — ABNORMAL LOW (ref >90)
GFR, EST AFRICAN AMERICAN: 14 mL/min — AB (ref >90)
GLUCOSE: 195 mg/dL — AB (ref 70–99)
Phosphorus: 4.2 mg/dL (ref 2.3–4.6)
Potassium: 4.2 mEq/L (ref 3.7–5.3)
SODIUM: 143 meq/L (ref 137–147)

## 2013-12-26 LAB — GLUCOSE, CAPILLARY
GLUCOSE-CAPILLARY: 98 mg/dL (ref 70–99)
Glucose-Capillary: 141 mg/dL — ABNORMAL HIGH (ref 70–99)
Glucose-Capillary: 209 mg/dL — ABNORMAL HIGH (ref 70–99)

## 2013-12-26 LAB — STREP PNEUMONIAE URINARY ANTIGEN: Strep Pneumo Urinary Antigen: NEGATIVE

## 2013-12-26 MED ORDER — NA FERRIC GLUC CPLX IN SUCROSE 12.5 MG/ML IV SOLN
125.0000 mg | Freq: Once | INTRAVENOUS | Status: AC
  Administered 2013-12-26: 125 mg via INTRAVENOUS
  Filled 2013-12-26 (×2): qty 10

## 2013-12-26 MED ORDER — ASPIRIN EC 81 MG PO TBEC
81.0000 mg | DELAYED_RELEASE_TABLET | Freq: Every day | ORAL | Status: DC
  Administered 2013-12-26 – 2013-12-30 (×5): 81 mg via ORAL
  Filled 2013-12-26 (×5): qty 1

## 2013-12-26 MED ORDER — FUROSEMIDE 10 MG/ML IJ SOLN
120.0000 mg | Freq: Once | INTRAMUSCULAR | Status: AC
  Administered 2013-12-26: 120 mg via INTRAVENOUS
  Filled 2013-12-26: qty 12

## 2013-12-26 MED ORDER — INSULIN GLARGINE 100 UNIT/ML ~~LOC~~ SOLN
15.0000 [IU] | Freq: Every day | SUBCUTANEOUS | Status: DC
  Administered 2013-12-26 – 2013-12-27 (×2): 15 [IU] via SUBCUTANEOUS
  Filled 2013-12-26 (×3): qty 0.15

## 2013-12-26 MED ORDER — DOXERCALCIFEROL 4 MCG/2ML IV SOLN
2.0000 ug | INTRAVENOUS | Status: DC
  Administered 2013-12-26 – 2013-12-30 (×3): 2 ug via INTRAVENOUS
  Filled 2013-12-26 (×3): qty 2

## 2013-12-26 MED ORDER — ISOSORBIDE MONONITRATE ER 60 MG PO TB24
120.0000 mg | ORAL_TABLET | Freq: Every day | ORAL | Status: DC
  Administered 2013-12-26 – 2013-12-30 (×4): 120 mg via ORAL
  Filled 2013-12-26 (×5): qty 2

## 2013-12-26 MED ORDER — ACETAMINOPHEN 325 MG PO TABS: 650.0000 mg | ORAL_TABLET | Freq: Four times a day (QID) | ORAL | Status: DC | PRN

## 2013-12-26 MED ORDER — METOPROLOL TARTRATE 12.5 MG HALF TABLET
12.5000 mg | ORAL_TABLET | Freq: Two times a day (BID) | ORAL | Status: DC
  Administered 2013-12-26 – 2013-12-28 (×5): 12.5 mg via ORAL
  Filled 2013-12-26 (×8): qty 1

## 2013-12-26 MED ORDER — HEPARIN SODIUM (PORCINE) 5000 UNIT/ML IJ SOLN
5000.0000 [IU] | Freq: Three times a day (TID) | INTRAMUSCULAR | Status: DC
  Administered 2013-12-26 – 2013-12-30 (×7): 5000 [IU] via SUBCUTANEOUS
  Filled 2013-12-26 (×15): qty 1

## 2013-12-26 MED ORDER — GUAIFENESIN ER 600 MG PO TB12
600.0000 mg | ORAL_TABLET | Freq: Two times a day (BID) | ORAL | Status: DC
  Administered 2013-12-26 – 2013-12-30 (×10): 600 mg via ORAL
  Filled 2013-12-26 (×10): qty 1

## 2013-12-26 MED ORDER — NITROGLYCERIN 0.4 MG SL SUBL: 0.4000 mg | SUBLINGUAL_TABLET | SUBLINGUAL | Status: DC | PRN

## 2013-12-26 MED ORDER — PANTOPRAZOLE SODIUM 40 MG PO TBEC
40.0000 mg | DELAYED_RELEASE_TABLET | Freq: Every day | ORAL | Status: DC
  Administered 2013-12-26 – 2013-12-30 (×5): 40 mg via ORAL
  Filled 2013-12-26 (×5): qty 1

## 2013-12-26 MED ORDER — LEVOTHYROXINE SODIUM 25 MCG PO TABS
25.0000 ug | ORAL_TABLET | Freq: Every day | ORAL | Status: DC
  Administered 2013-12-26 – 2013-12-30 (×5): 25 ug via ORAL
  Filled 2013-12-26 (×6): qty 1

## 2013-12-26 MED ORDER — ALBUTEROL SULFATE (2.5 MG/3ML) 0.083% IN NEBU
2.5000 mg | INHALATION_SOLUTION | Freq: Four times a day (QID) | RESPIRATORY_TRACT | Status: DC
  Administered 2013-12-26 – 2013-12-27 (×3): 2.5 mg via RESPIRATORY_TRACT
  Filled 2013-12-26 (×4): qty 3

## 2013-12-26 MED ORDER — SIMVASTATIN 10 MG PO TABS
10.0000 mg | ORAL_TABLET | Freq: Every evening | ORAL | Status: DC
  Administered 2013-12-26 – 2013-12-30 (×5): 10 mg via ORAL
  Filled 2013-12-26 (×5): qty 1

## 2013-12-26 MED ORDER — DOXERCALCIFEROL 4 MCG/2ML IV SOLN
INTRAVENOUS | Status: AC
  Administered 2013-12-26: 2 ug via INTRAVENOUS
  Filled 2013-12-26: qty 2

## 2013-12-26 MED ORDER — CALCIUM ACETATE 667 MG PO CAPS
667.0000 mg | ORAL_CAPSULE | Freq: Three times a day (TID) | ORAL | Status: DC
  Administered 2013-12-26 – 2013-12-30 (×14): 667 mg via ORAL
  Filled 2013-12-26 (×16): qty 1

## 2013-12-26 MED ORDER — SODIUM CHLORIDE 0.9 % IV SOLN
62.5000 mg | INTRAVENOUS | Status: DC
  Administered 2013-12-28: 62.5 mg via INTRAVENOUS
  Filled 2013-12-26 (×2): qty 5

## 2013-12-26 MED ORDER — ONDANSETRON HCL 4 MG PO TABS: 4.0000 mg | ORAL_TABLET | Freq: Four times a day (QID) | ORAL | Status: DC | PRN

## 2013-12-26 MED ORDER — IPRATROPIUM BROMIDE 0.02 % IN SOLN
0.5000 mg | Freq: Four times a day (QID) | RESPIRATORY_TRACT | Status: DC
  Administered 2013-12-26 – 2013-12-27 (×3): 0.5 mg via RESPIRATORY_TRACT
  Filled 2013-12-26 (×4): qty 2.5

## 2013-12-26 MED ORDER — TIOTROPIUM BROMIDE MONOHYDRATE 18 MCG IN CAPS
18.0000 ug | ORAL_CAPSULE | Freq: Every day | RESPIRATORY_TRACT | Status: DC
Start: 2013-12-26 — End: 2013-12-26
  Filled 2013-12-26: qty 5

## 2013-12-26 MED ORDER — ONDANSETRON HCL 4 MG/2ML IJ SOLN: 4.0000 mg | Freq: Four times a day (QID) | INTRAMUSCULAR | Status: DC | PRN

## 2013-12-26 MED ORDER — INSULIN ASPART 100 UNIT/ML ~~LOC~~ SOLN
0.0000 [IU] | Freq: Every day | SUBCUTANEOUS | Status: DC
  Administered 2013-12-26 – 2013-12-27 (×2): 2 [IU] via SUBCUTANEOUS
  Administered 2013-12-28: 5 [IU] via SUBCUTANEOUS

## 2013-12-26 MED ORDER — BUDESONIDE 0.25 MG/2ML IN SUSP
0.2500 mg | Freq: Two times a day (BID) | RESPIRATORY_TRACT | Status: DC
Start: 2013-12-26 — End: 2013-12-30
  Administered 2013-12-26 – 2013-12-30 (×8): 0.25 mg via RESPIRATORY_TRACT
  Filled 2013-12-26 (×12): qty 2

## 2013-12-26 MED ORDER — INSULIN ASPART 100 UNIT/ML ~~LOC~~ SOLN
0.0000 [IU] | Freq: Three times a day (TID) | SUBCUTANEOUS | Status: DC
  Administered 2013-12-26: 2 [IU] via SUBCUTANEOUS
  Administered 2013-12-26: 5 [IU] via SUBCUTANEOUS
  Administered 2013-12-27: 3 [IU] via SUBCUTANEOUS
  Administered 2013-12-27: 5 [IU] via SUBCUTANEOUS
  Administered 2013-12-27: 3 [IU] via SUBCUTANEOUS
  Administered 2013-12-28: 2 [IU] via SUBCUTANEOUS
  Administered 2013-12-28 – 2013-12-29 (×2): 3 [IU] via SUBCUTANEOUS
  Administered 2013-12-29 (×2): 2 [IU] via SUBCUTANEOUS
  Administered 2013-12-30: 3 [IU] via SUBCUTANEOUS
  Administered 2013-12-30: 2 [IU] via SUBCUTANEOUS

## 2013-12-26 MED ORDER — ACETAMINOPHEN 650 MG RE SUPP: 650.0000 mg | Freq: Four times a day (QID) | RECTAL | Status: DC | PRN

## 2013-12-26 MED ORDER — SODIUM CHLORIDE 0.9 % IJ SOLN
3.0000 mL | Freq: Two times a day (BID) | INTRAMUSCULAR | Status: DC
  Administered 2013-12-26 – 2013-12-30 (×9): 3 mL via INTRAVENOUS

## 2013-12-26 MED ORDER — RENA-VITE PO TABS
1.0000 | ORAL_TABLET | Freq: Every day | ORAL | Status: DC
  Administered 2013-12-26 – 2013-12-29 (×4): 1 via ORAL
  Filled 2013-12-26 (×5): qty 1

## 2013-12-26 NOTE — ED Notes (Signed)
Phlebotomy at bedside.

## 2013-12-26 NOTE — Progress Notes (Signed)
BIPAP transported to pt's new room to be available PRN per MD request.

## 2013-12-26 NOTE — ED Provider Notes (Signed)
CSN: [id number]     Arrival date & time 12/26/13  0108 History   First MD Initiated Contact with Patient 12/26/13 0127     Chief Complaint  Patient presents with  . Shortness of Breath     (Consider location/radiation/quality/duration/timing/severity/associated sxs/prior Treatment) HPI  This is a 73 yo dialysis patient with multiple chronic medical problems including HLD< CAD, HTN, COPD - O2 dependent, cardiomyopathy.   She presents with complaints of a minimally productive cough and about 24 hours of increasingly severe shortness of breath. She denies chest pain and fever. The patient says she was last dialyzed on Saturday, 2 days ago. She received only 2 hours of dialysis because the dialysis center had to squeeze and 70 patient's due to weather  cancellation. She reports compliance with all medications.  No fever. The patient does have some orthopnea. The patient arrived via POV. She was found to be hypoxemic on her baseline 2 L of oxygen with sats in the 70s. She requires 6 L of oxygen via nasal cannula to maintain sats in the mid to upper 90s.  Past Medical History  Diagnosis Date  . Hypercholesterolemia   . Hypothyroidism   . Colitis   . Coronary artery disease   . Hypertension   . GERD (gastroesophageal reflux disease)   . Myocardial infarction   . Peripheral vascular disease   . Angina   . H/O Clostridium difficile infection   . Hx of pulmonary embolus   . COPD (chronic obstructive pulmonary disease)   . Shortness of breath     uses O2 as needed  . Critical lower limb ischemia   . Claudication   . DVT (deep venous thrombosis) 2008    "right"  . Pneumonia     "several times"  . On home oxygen therapy     "3-4L; just at night" (12/07/2013)  . Type II diabetes mellitus   . ESRD (end stage renal disease) on dialysis     M-W-F   [place] (12/07/2013)  . CHF (congestive heart failure)    Past Surgical History  Procedure Laterality Date  . Tubal ligation    . Toe  amputation Right     5th toe  . Tonsillectomy    . Insertion of dialysis catheter Left     chest  . Av fistula placement Right [id number]    RUE  . Bascilic vein transposition  May 18, 2012    First stage  . Bascilic vein transposition Left 07/27/12    Basilic Vein Transposition  . Arch aortogram  08/19/2012    Procedure: ARCH AORTOGRAM;  Surgeon: Fransisco Hertz, MD;  Location: [facility] OR;  Service: Vascular;  Laterality: N/A;  . Insertion of dialysis catheter  08/21/2012    Procedure: INSERTION OF DIALYSIS CATHETER;  Surgeon: Sherren Kerns, MD;  Location: [facility] OR;  Service: Vascular;  Laterality: Left;  internal jugular   Family History  Problem Relation Age of Onset  . Diabetes type II    . Hypertension    . Diabetes Mother   . Kidney disease Mother   . Diabetes Father   . Anesthesia problems Neg Hx    History  Substance Use Topics  . Smoking status: Former Smoker -- 1.00 packs/day for 40 years    Types: Cigarettes    Quit date: 06/11/2011  . Smokeless tobacco: Never Used  . Alcohol Use: No   OB History   Grav Para Term Preterm Abortions TAB SAB Ect Mult Living  Review of Systems Ten point review of symptoms performed and is negative with the exception of symptoms noted above.     Allergies  Procaine hcl  Home Medications   Current Outpatient Rx  Name  Route  Sig  Dispense  Refill  . albuterol (PROVENTIL) (5 MG/ML) 0.5% nebulizer solution   Nebulization   Take 0.5 mLs (2.5 mg total) by nebulization 3 (three) times daily.   20 mL   1   . aspirin EC 81 MG tablet   Oral   Take 81 mg by mouth daily.         . budesonide (PULMICORT) 0.25 MG/2ML nebulizer solution   Nebulization   Take 2 mLs (0.25 mg total) by nebulization 2 (two) times daily.   60 mL   1   . calcium acetate (PHOSLO) 667 MG capsule   Oral   Take 667 mg by mouth 3 (three) times daily with meals.         . insulin glargine (LANTUS) 100 UNIT/ML injection   Subcutaneous    Inject 0.15 mLs (15 Units total) into the skin at bedtime.   10 mL   11   . isosorbide mononitrate (IMDUR) 120 MG 24 hr tablet   Oral   Take 120 mg by mouth daily.          Marland Kitchen levothyroxine (SYNTHROID, LEVOTHROID) 25 MCG tablet   Oral   Take 25 mcg by mouth daily.          . metoprolol tartrate (LOPRESSOR) 25 MG tablet   Oral   Take 12.5 mg by mouth daily.         . nitroGLYCERIN (NITROSTAT) 0.4 MG SL tablet   Sublingual   Place 0.4 mg under the tongue every 5 (five) minutes as needed. For chest pain.         Marland Kitchen NOVOLIN R RELION 100 UNIT/ML injection   Subcutaneous   Inject 6-10 Units into the skin 3 (three) times daily. Sliding scale         . pantoprazole (PROTONIX) 40 MG tablet   Oral   Take 40 mg by mouth daily.          . Probiotic Product (PROBIOTIC PO)   Oral   Take 1 capsule by mouth daily as needed (digestion).          . simvastatin (ZOCOR) 10 MG tablet   Oral   Take 10 mg by mouth every evening.         . tiotropium (SPIRIVA) 18 MCG inhalation capsule   Inhalation   Place 1 capsule (18 mcg total) into inhaler and inhale daily.   30 capsule   12    BP 173/69  Temp(Src) 97.7 F (36.5 C) (Oral)  Resp 23  Ht 5\' 5"  (1.651 m)  Wt 168 lb (76.204 kg)  BMI 27.96 kg/m2  SpO2 95% Physical Exam Gen: well developed and well nourished appearing, chronically ill-appearing Head: NCAT Eyes: PERL, EOMI Nose: no epistaixis or rhinorrhea Mouth/throat: mucosa is moist and pink Neck: supple, no stridor Lungs: Respiratory rate 28 per minute, bibasilar rales. no wheezing, rhonchi Chest wall: permacath in left chest CV: rapid and regular, no murmur, extremities appear well perfused.  Abd: soft, obese, notender, nondistended Back: no ttp, no cva ttp Skin: warm and dry Ext: normal to inspection, no dependent edema Neuro: CN ii-xii grossly intact, no focal deficits Psyche; normal affect,  calm and cooperative.   ED Course  Procedures (including  critical  care time) Labs Review  Results for orders placed during the hospital encounter of 12/26/13 (from the past 24 hour(s))  CBC WITH DIFFERENTIAL     Status: Abnormal   Collection Time    12/26/13  3:34 AM      Result Value Ref Range   WBC 10.6 (*) 4.0 - 10.5 K/uL   RBC 4.11  3.87 - 5.11 MIL/uL   Hemoglobin 12.3  12.0 - 15.0 g/dL   HCT 08.6  57.8 - 46.9 %   MCV 98.1  78.0 - 100.0 fL   MCH 29.9  26.0 - 34.0 pg   MCHC 30.5  30.0 - 36.0 g/dL   RDW 62.9 (*) 52.8 - 41.3 %   Platelets 353  150 - 400 K/uL   Neutrophils Relative % 81 (*) 43 - 77 %   Neutro Abs 8.6 (*) 1.7 - 7.7 K/uL   Lymphocytes Relative 10 (*) 12 - 46 %   Lymphs Abs 1.1  0.7 - 4.0 K/uL   Monocytes Relative 6  3 - 12 %   Monocytes Absolute 0.7  0.1 - 1.0 K/uL   Eosinophils Relative 2  0 - 5 %   Eosinophils Absolute 0.2  0.0 - 0.7 K/uL   Basophils Relative 1  0 - 1 %   Basophils Absolute 0.1  0.0 - 0.1 K/uL  COMPREHENSIVE METABOLIC PANEL     Status: Abnormal   Collection Time    12/26/13  3:34 AM      Result Value Ref Range   Sodium 139  137 - 147 mEq/L   Potassium 5.9 (*) 3.7 - 5.3 mEq/L   Chloride 96  96 - 112 mEq/L   CO2 30  19 - 32 mEq/L   Glucose, Bld 114 (*) 70 - 99 mg/dL   BUN 39 (*) 6 - 23 mg/dL   Creatinine, Ser 2.44 (*) 0.50 - 1.10 mg/dL   Calcium 9.0  8.4 - 01.0 mg/dL   Total Protein 7.1  6.0 - 8.3 g/dL   Albumin 3.5  3.5 - 5.2 g/dL   AST 52 (*) 0 - 37 U/L   ALT 37 (*) 0 - 35 U/L   Alkaline Phosphatase 125 (*) 39 - 117 U/L   Total Bilirubin <0.2 (*) 0.3 - 1.2 mg/dL   GFR calc non Af Amer 13 (*) >90 mL/min   GFR calc Af Amer 15 (*) >90 mL/min  I-STAT ARTERIAL BLOOD GAS, ED     Status: Abnormal   Collection Time    12/26/13  3:56 AM      Result Value Ref Range   pH, Arterial 7.337 (*) 7.350 - 7.450   pCO2 arterial 56.4 (*) 35.0 - 45.0 mmHg   pO2, Arterial 51.0 (*) 80.0 - 100.0 mmHg   Bicarbonate 30.4 (*) 20.0 - 24.0 mEq/L   TCO2 32  0 - 100 mmol/L   O2 Saturation 84.0     Acid-Base  Excess 3.0 (*) 0.0 - 2.0 mmol/L   Patient temperature 97.7 F     Collection site RADIAL, ALLEN'S TEST ACCEPTABLE     Drawn by RT     Sample type ARTERIAL       EKG: NSR, LVH pattern, normal axis, normal intervals, no acute ischemic changes.   MDM   DDX: pneumonia, pulmonary edema, ACS, pulmonary embolus  0340 - patient re-examined due to oxygen desaturations. Repositioned in bed and pulse ox applied to the patient's earlobe. This brought sats up to 88% on 6L.  Patient continues to sound wet. BP is mild to moderately elevated. CXR is consistent with pulmonary edema/volume overload.   We are treating with lasix 120mg  IV as the patient still makes urine. I believe that her definitive treatment will be hemodialysis.   CRITICAL CARE Performed by: Brandt LoosenManly, Julie   Total critical care time: 6165m  Critical care time was exclusive of separately billable procedures and treating other patients.  Critical care was necessary to treat or prevent imminent or life-threatening deterioration.  Critical care was time spent personally by me on the following activities: development of treatment plan with patient and/or surrogate as well as nursing, discussions with consultants, evaluation of patient's response to treatment, examination of patient, obtaining history from patient or surrogate, ordering and performing treatments and interventions, ordering and review of laboratory studies, ordering and review of radiographic studies, pulse oximetry and re-evaluation of patient's condition.  [id number]: Patient looking and feeling better after tx with bipap and lasix. Case discussed with Dr. Eliott Nineunham of Nephrology who will arrange urgent dialysis. Case discussed with Dr. Allena KatzPatel of the [facility] service and he will admit to the SDU.    Brandt LoosenJulie Manly, MD 12/26/13 [phone]

## 2013-12-26 NOTE — H&P (Signed)
[facility] History and Physical  Patient: [name] [name]  WGN:[id number]  DOB: 01/19/1941  DOS: the patient was seen and examined on 12/26/2013 PCP: [name],[name], MD  Chief Complaint: Shortness of breath  HPI: [name] [name] is a 73 y.o. female with Past medical history of hypothyroidism, coronary artery disease, GERD, peripheral vascular disease, ESRD on hemodialysis, history of DVT, COPD, chronic oxygen use. The patient is coming from home. Patient presented with complaints of cough with whitish sputum and shortness of breath started within one day. She mentions that she has been compliant with her hemodialysis but she was last hemodialyzed on Saturday for only 2 hours since the dialysis Center was unable to accommodate longer hours. She denies any other sick contact she denies any fever or chills nausea or vomiting abdominal pain chest pain diarrhea or constipation. She mentions she is compliant with all her medications. Since last 2 days she started having orthopnea along with PND. She uses 2 L of oxygen at her baseline and was in 70s on that on arrival.  Review of Systems: as mentioned in the history of present illness.  A Comprehensive review of the other systems is negative.  Past Medical History  Diagnosis Date  . Hypercholesterolemia   . Hypothyroidism   . Colitis   . Coronary artery disease   . Hypertension   . GERD (gastroesophageal reflux disease)   . Myocardial infarction   . Peripheral vascular disease   . Angina   . H/O Clostridium difficile infection   . Hx of pulmonary embolus   . COPD (chronic obstructive pulmonary disease)   . Shortness of breath     uses O2 as needed  . Critical lower limb ischemia   . Claudication   . DVT (deep venous thrombosis) 2008    "right"  . Pneumonia     "several times"  . On home oxygen therapy     "3-4L; just at night" (12/07/2013)  . Type II diabetes mellitus   . ESRD (end stage renal disease) on dialysis     M-W-F    [place] (12/07/2013)  . CHF (congestive heart failure)    Past Surgical History  Procedure Laterality Date  . Tubal ligation    . Toe amputation Right     5th toe  . Tonsillectomy    . Insertion of dialysis catheter Left     chest  . Av fistula placement Right [phone]    RUE  . Bascilic vein transposition  May 18, 2012    First stage  . Bascilic vein transposition Left 07/27/12    Basilic Vein Transposition  . Arch aortogram  08/19/2012    Procedure: ARCH AORTOGRAM;  Surgeon: Fransisco HertzBrian L Chen, MD;  [facility];  Service: Vascular;  Laterality: N/A;  . Insertion of dialysis catheter  08/21/2012    Procedure: INSERTION OF DIALYSIS CATHETER;  Surgeon: Sherren Kernsharles E Fields, MD;  [facility];  Service: Vascular;  Laterality: Left;  internal jugular   Social History:  reports that she quit smoking about 2 years ago. Her smoking use included Cigarettes. She has a 40 pack-year smoking history. She has never used smokeless tobacco. She reports that she does not drink alcohol or use illicit drugs. Independent for most of her  ADL.  Allergies  Allergen Reactions  . Procaine Hcl Nausea And Vomiting and Other (See Comments)    "Pass out"    Family History  Problem Relation Age of Onset  . Diabetes type II    .  Hypertension    . Diabetes Mother   . Kidney disease Mother   . Diabetes Father   . Anesthesia problems Neg Hx     Prior to Admission medications   Medication Sig Start Date End Date Taking? Authorizing Provider  albuterol (PROVENTIL) (5 MG/ML) 0.5% nebulizer solution Take 0.5 mLs (2.5 mg total) by nebulization 3 (three) times daily. 10/31/13   Jerald Kief, MD  aspirin EC 81 MG tablet Take 81 mg by mouth daily.    Historical Provider, MD  budesonide (PULMICORT) 0.25 MG/2ML nebulizer solution Take 2 mLs (0.25 mg total) by nebulization 2 (two) times daily. 10/31/13   Jerald Kief, MD  calcium acetate (PHOSLO) 667 MG capsule Take 667 mg by mouth 3 (three) times daily with  meals.    Historical Provider, MD  insulin glargine (LANTUS) 100 UNIT/ML injection Inject 0.15 mLs (15 Units total) into the skin at bedtime. 11/13/13   Rhetta Mura, MD  isosorbide mononitrate (IMDUR) 120 MG 24 hr tablet Take 120 mg by mouth daily.     Historical Provider, MD  levothyroxine (SYNTHROID, LEVOTHROID) 25 MCG tablet Take 25 mcg by mouth daily.     Historical Provider, MD  metoprolol tartrate (LOPRESSOR) 25 MG tablet Take 12.5 mg by mouth daily.    Historical Provider, MD  nitroGLYCERIN (NITROSTAT) 0.4 MG SL tablet Place 0.4 mg under the tongue every 5 (five) minutes as needed. For chest pain.    Historical Provider, MD  NOVOLIN R RELION 100 UNIT/ML injection Inject 6-10 Units into the skin 3 (three) times daily. Sliding scale 10/15/13   Historical Provider, MD  pantoprazole (PROTONIX) 40 MG tablet Take 40 mg by mouth daily.     Historical Provider, MD  Probiotic Product (PROBIOTIC PO) Take 1 capsule by mouth daily as needed (digestion).     Historical Provider, MD  simvastatin (ZOCOR) 10 MG tablet Take 10 mg by mouth every evening.    Historical Provider, MD  tiotropium (SPIRIVA) 18 MCG inhalation capsule Place 1 capsule (18 mcg total) into inhaler and inhale daily. 11/13/13   Rhetta Mura, MD    Physical Exam: Filed Vitals:   12/26/13 0515 12/26/13 0527 12/26/13 0545 12/26/13 0628  BP: 176/71  172/74 159/72  Pulse: 87  91 95  Temp:      TempSrc:      Resp: 16  19 23   Height:    5\' 5"  (1.651 m)  Weight:    70.4 kg (155 lb 3.3 oz)  SpO2: 96% 95% 93% 89%    General: Alert, Awake and Oriented to Time, Place and Person. Appear in marked distress Eyes: PERRL ENT: Oral Mucosa clear moist. Neck: mild  JVD Cardiovascular: S1 and S2 Present,  aortic systolic  Murmur, Peripheral Pulses Present Respiratory: Bilateral Air entry equal and Decreased, Bilateral basal  Crackles, no  wheezes Abdomen: Bowel Sound Present, Soft and Non tender Skin:  no  Rash Extremities:   bilateral  Pedal edema,  No  calf tenderness Neurologic: Grossly Unremarkable.  Labs on Admission:  CBC:  Recent Labs Lab 12/26/13 0334  WBC 10.6*  NEUTROABS 8.6*  HGB 12.3  HCT 40.3  MCV 98.1  PLT 353    CMP     Component Value Date/Time   NA 139 12/26/2013 0334   K 5.9* 12/26/2013 0334   CL 96 12/26/2013 0334   CO2 30 12/26/2013 0334   GLUCOSE 114* 12/26/2013 0334   BUN 39* 12/26/2013 0334   CREATININE 3.40* 12/26/2013 1478  CREATININE 3.64* 07/26/2013 1219   CALCIUM 9.0 12/26/2013 0334   PROT 7.1 12/26/2013 0334   ALBUMIN 3.5 12/26/2013 0334   AST 52* 12/26/2013 0334   ALT 37* 12/26/2013 0334   ALKPHOS 125* 12/26/2013 0334   BILITOT <0.2* 12/26/2013 0334   GFRNONAA 13* 12/26/2013 0334   GFRAA 15* 12/26/2013 0334    No results found for this basename: LIPASE, AMYLASE,  in the last 168 hours No results found for this basename: AMMONIA,  in the last 168 hours  No results found for this basename: CKTOTAL, CKMB, CKMBINDEX, TROPONINI,  in the last 168 hours BNP (last 3 results)  Recent Labs  10/26/13 0510 11/07/13 0455  PROBNP 9552.0* 16109.6*    Radiological Exams on Admission: Dg Chest Port 1 View  12/26/2013   CLINICAL DATA:  Shortness of breath.  EXAM: PORTABLE CHEST - 1 VIEW  COMPARISON:  Chest radiograph performed 12/07/2013  FINDINGS: The lungs are well-aerated. Small to moderate bilateral pleural effusions are seen, with bibasilar airspace opacification. Underlying vascular congestion is noted. Findings are concerning for pulmonary edema. No pneumothorax is identified.  The cardiomediastinal silhouette is borderline normal in size. A left-sided dual lumen catheter is seen ending about the distal SVC. No acute osseous abnormalities are seen.  IMPRESSION: Small to moderate bilateral pleural effusions, with bibasilar airspace opacification. Underlying vascular congestion noted. Findings concerning for pulmonary edema.   Electronically Signed   By: Roanna Raider M.D.   On:  12/26/2013 04:09    EKG: Independently reviewed. nonspecific ST and T waves changes, sinus tachycardia.  Assessment/Plan Principal Problem:   Acute respiratory failure with hypoxia Active Problems:   Hypothyroidism   History of DVT (deep vein thrombosis)   CAD (coronary artery disease)   Diabetes   ESRD (end stage renal disease)   1. Acute respiratory failure with hypoxia  the patient is presenting with complaints of shortness of breath progressively worsening last 2 days associated with orthopnea PND and shortness session of hemodialysis.she also has some cough with sensation of catching up a flu. I will check influenza PCR start her on Tamiflu Nephrology is consulted for hemodialysis Patient has received a dose of Lasix in the ED for continued to monitor her BiPAP as needed and duo nebs as needed  If hemodialysis does not improve her breathing then she may require further workup and treatment for possible bronchitis/COPD exacerbation/pneumonia but right now she does not have leukocytosis or fever to suggest inflammatory infectious etiology  Follow serial troponins   2. coronary artery disease  Telemetry monitoring continue aspirin follow serial troponins   3. diabetes mellitus  Continue insulin and placed on sliding scale   4. hypothyroidism   continue Synthroid  DVT Prophylaxis: subcutaneous Heparin Nutrition:  cardiac and diabetic diet renal diet   Code Status: Full  Disposition: Admitted to inpatient in step-down unit.  Author: Lynden Oxford, MD [facility] Pager: [phone] 12/26/2013, 6:35 AM    If 7PM-7AM, please contact night-coverage [email or web address] Password [email or web address]

## 2013-12-26 NOTE — ED Notes (Signed)
Respiratory at bedside.  Setting pt up on Bipap.

## 2013-12-26 NOTE — Progress Notes (Signed)
Utilization Review Completed.  

## 2013-12-26 NOTE — Consult Note (Addendum)
Indication for Consultation:  Management of ESRD/hemodialysis; anemia, hypertension/volume and secondary hyperparathyroidism  HPI: [name] [name] is a 73 y.o. female admitted with dyspnea. She recives HD MWF in [place] but ran short last week d/t the weather but states she is normally compliant with HD. She started to experience SOB about 5 days ago progressively worsening, denies any other symptoms and has been feeling well.   Past Medical History  Diagnosis Date  . Hypercholesterolemia   . Hypothyroidism   . Colitis   . Coronary artery disease   . Hypertension   . GERD (gastroesophageal reflux disease)   . Myocardial infarction   . Peripheral vascular disease   . Angina   . H/O Clostridium difficile infection   . Hx of pulmonary embolus   . COPD (chronic obstructive pulmonary disease)   . Shortness of breath     uses O2 as needed  . Critical lower limb ischemia   . Claudication   . DVT (deep venous thrombosis) 2008    "right"  . Pneumonia     "several times"  . On home oxygen therapy     "3-4L; just at night" (12/07/2013)  . Type II diabetes mellitus   . ESRD (end stage renal disease) on dialysis     M-W-F   [place] (12/07/2013)  . CHF (congestive heart failure)    Past Surgical History  Procedure Laterality Date  . Tubal ligation    . Toe amputation Right     5th toe  . Tonsillectomy    . Insertion of dialysis catheter Left     chest  . Av fistula placement Right [id number]    RUE  . Bascilic vein transposition  May 18, 2012    First stage  . Bascilic vein transposition Left 07/27/12    Basilic Vein Transposition  . Arch aortogram  08/19/2012    Procedure: ARCH AORTOGRAM;  Surgeon: Fransisco Hertz, MD;  Location: [facility] OR;  Service: Vascular;  Laterality: N/A;  . Insertion of dialysis catheter  08/21/2012    Procedure: INSERTION OF DIALYSIS CATHETER;  Surgeon: Sherren Kerns, MD;  Location: [facility] OR;  Service: Vascular;  Laterality: Left;  internal jugular   Family History   Problem Relation Age of Onset  . Diabetes type II    . Hypertension    . Diabetes Mother   . Kidney disease Mother   . Diabetes Father   . Anesthesia problems Neg Hx    Social History:  reports that she quit smoking about 2 years ago. Her smoking use included Cigarettes. She has a 40 pack-year smoking history. She has never used smokeless tobacco. She reports that she does not drink alcohol or use illicit drugs. Allergies  Allergen Reactions  . Procaine Hcl Nausea And Vomiting and Other (See Comments)    "Pass out"   Prior to Admission medications   Medication Sig Start Date End Date Taking? Authorizing Provider  albuterol (PROVENTIL) (5 MG/ML) 0.5% nebulizer solution Take 0.5 mLs (2.5 mg total) by nebulization 3 (three) times daily. 10/31/13   Jerald Kief, MD  aspirin EC 81 MG tablet Take 81 mg by mouth daily.    Historical Provider, MD  budesonide (PULMICORT) 0.25 MG/2ML nebulizer solution Take 2 mLs (0.25 mg total) by nebulization 2 (two) times daily. 10/31/13   Jerald Kief, MD  calcium acetate (PHOSLO) 667 MG capsule Take 667 mg by mouth 3 (three) times daily with meals.    Historical Provider, MD  insulin  glargine (LANTUS) 100 UNIT/ML injection Inject 0.15 mLs (15 Units total) into the skin at bedtime. 11/13/13   Rhetta Mura, MD  isosorbide mononitrate (IMDUR) 120 MG 24 hr tablet Take 120 mg by mouth daily.     Historical Provider, MD  levothyroxine (SYNTHROID, LEVOTHROID) 25 MCG tablet Take 25 mcg by mouth daily.     Historical Provider, MD  metoprolol tartrate (LOPRESSOR) 25 MG tablet Take 12.5 mg by mouth daily.    Historical Provider, MD  nitroGLYCERIN (NITROSTAT) 0.4 MG SL tablet Place 0.4 mg under the tongue every 5 (five) minutes as needed. For chest pain.    Historical Provider, MD  NOVOLIN R RELION 100 UNIT/ML injection Inject 6-10 Units into the skin 3 (three) times daily. Sliding scale 10/15/13   Historical Provider, MD  pantoprazole (PROTONIX) 40 MG tablet  Take 40 mg by mouth daily.     Historical Provider, MD  Probiotic Product (PROBIOTIC PO) Take 1 capsule by mouth daily as needed (digestion).     Historical Provider, MD  simvastatin (ZOCOR) 10 MG tablet Take 10 mg by mouth every evening.    Historical Provider, MD  tiotropium (SPIRIVA) 18 MCG inhalation capsule Place 1 capsule (18 mcg total) into inhaler and inhale daily. 11/13/13   Rhetta Mura, MD   Current Facility-Administered Medications  Medication Dose Route Frequency Provider Last Rate Last Dose  . acetaminophen (TYLENOL) tablet 650 mg  650 mg Oral Q6H PRN Lynden Oxford, MD       Or  . acetaminophen (TYLENOL) suppository 650 mg  650 mg Rectal Q6H PRN Lynden Oxford, MD      . albuterol (PROVENTIL) (2.5 MG/3ML) 0.083% nebulizer solution 2.5 mg  2.5 mg Nebulization Q6H Lynden Oxford, MD   2.5 mg at 12/26/13 0747  . aspirin EC tablet 81 mg  81 mg Oral Daily Lynden Oxford, MD      . budesonide (PULMICORT) nebulizer solution 0.25 mg  0.25 mg Nebulization BID Lynden Oxford, MD   0.25 mg at 12/26/13 0747  . calcium acetate (PHOSLO) capsule 667 mg  667 mg Oral TID WC Lynden Oxford, MD   667 mg at 12/26/13 0817  . guaiFENesin (MUCINEX) 12 hr tablet 600 mg  600 mg Oral BID Lynden Oxford, MD   600 mg at 12/26/13 0947  . heparin injection 5,000 Units  5,000 Units Subcutaneous 3 times per day Lynden Oxford, MD      . insulin aspart (novoLOG) injection 0-15 Units  0-15 Units Subcutaneous TID WC Lynden Oxford, MD   2 Units at 12/26/13 [phone]  . insulin aspart (novoLOG) injection 0-5 Units  0-5 Units Subcutaneous QHS Lynden Oxford, MD      . insulin glargine (LANTUS) injection 15 Units  15 Units Subcutaneous QHS Lynden Oxford, MD      . ipratropium (ATROVENT) nebulizer solution 0.5 mg  0.5 mg Nebulization Q6H Lynden Oxford, MD   0.5 mg at 12/26/13 0747  . isosorbide mononitrate (IMDUR) 24 hr tablet 120 mg  120 mg Oral Daily Lynden Oxford, MD   120 mg at 12/26/13 0947  . levothyroxine (SYNTHROID, LEVOTHROID)  tablet 25 mcg  25 mcg Oral QAC breakfast Lynden Oxford, MD   25 mcg at 12/26/13 0817  . metoprolol tartrate (LOPRESSOR) tablet 12.5 mg  12.5 mg Oral BID Lynden Oxford, MD   12.5 mg at 12/26/13 0947  . nitroGLYCERIN (NITROSTAT) SL tablet 0.4 mg  0.4 mg Sublingual Q5 min PRN Lynden Oxford, MD      . ondansetron (  ZOFRAN) tablet 4 mg  4 mg Oral Q6H PRN Lynden OxfordPranav Patel, MD       Or  . ondansetron (ZOFRAN) injection 4 mg  4 mg Intravenous Q6H PRN Lynden OxfordPranav Patel, MD      . pantoprazole (PROTONIX) EC tablet 40 mg  40 mg Oral Daily Lynden OxfordPranav Patel, MD      . simvastatin (ZOCOR) tablet 10 mg  10 mg Oral QPM Lynden OxfordPranav Patel, MD      . sodium chloride 0.9 % injection 3 mL  3 mL Intravenous Q12H Lynden OxfordPranav Patel, MD   3 mL at 12/26/13 0817   Labs: Basic Metabolic Panel:  Recent Labs Lab 12/26/13 0334  NA 139  K 5.9*  CL 96  CO2 30  GLUCOSE 114*  BUN 39*  CREATININE 3.40*  CALCIUM 9.0   Liver Function Tests:  Recent Labs Lab 12/26/13 0334  AST 52*  ALT 37*  ALKPHOS 125*  BILITOT <0.2*  PROT 7.1  ALBUMIN 3.5   No results found for this basename: LIPASE, AMYLASE,  in the last 168 hours No results found for this basename: AMMONIA,  in the last 168 hours CBC:  Recent Labs Lab 12/26/13 0334  WBC 10.6*  NEUTROABS 8.6*  HGB 12.3  HCT 40.3  MCV 98.1  PLT 353   Cardiac Enzymes: No results found for this basename: CKTOTAL, CKMB, CKMBINDEX, TROPONINI,  in the last 168 hours CBG:  Recent Labs Lab 12/26/13 0526 12/26/13 0719  GLUCAP 129* 141*   Iron Studies: No results found for this basename: IRON, TIBC, TRANSFERRIN, FERRITIN,  in the last 72 hours Studies/Results: Dg Chest Port 1 View  12/26/2013   CLINICAL DATA:  Shortness of breath.  EXAM: PORTABLE CHEST - 1 VIEW  COMPARISON:  Chest radiograph performed 12/07/2013  FINDINGS: The lungs are well-aerated. Small to moderate bilateral pleural effusions are seen, with bibasilar airspace opacification. Underlying vascular congestion is noted.  Findings are concerning for pulmonary edema. No pneumothorax is identified.  The cardiomediastinal silhouette is borderline normal in size. A left-sided dual lumen catheter is seen ending about the distal SVC. No acute osseous abnormalities are seen.  IMPRESSION: Small to moderate bilateral pleural effusions, with bibasilar airspace opacification. Underlying vascular congestion noted. Findings concerning for pulmonary edema.   Electronically Signed   By: Roanna RaiderJeffery  Chang M.D.   On: 12/26/2013 04:09      Review of Systems: Gen: Reports weakness and fatigue. Denies any fever, chills, sweats, anorexia,   malaise, weight loss, and sleep disorder HEENT: No visual complaints, No history of Retinopathy. Normal external appearance No Epistaxis or Sore throat. No sinusitis.   CV: Reports orthopnea, PND, peripheral edema. Denies chest pain, angina, palpitations, syncope, and claudication. Resp:  Reports dyspnea at rest, dyspnea with exercise, dry cough. Denies sputum, wheezing, coughing up blood, and pleurisy. GI: Denies vomiting blood, jaundice, and fecal incontinence.   Denies dysphagia or odynophagia.  GU : Denies urinary burning, blood in urine, urinary frequency, urinary hesitancy, nocturnal urination, and urinary incontinence.  Voids multiple times a day MS: Denies joint pain, limitation of movement, and swelling, stiffness, low back pain, extremity pain. Denies muscle weakness, cramps, atrophy.  No use of non steroidal antiinflammatory drugs. Derm: Denies rash, itching, dry skin, hives, moles, warts, or Reports L heel wound and follows at wound center. Psych: Denies depression, anxiety, memory loss, suicidal ideation, hallucinations, paranoia, and confusion. Heme: Denies bruising, bleeding, and enlarged lymph nodes. Neuro: No headache.  No diplopia. No dysarthria.  No dysphasia.  No paresthesias.  No weakness.Marland Kitchen  Physical Exam: Filed Vitals:   12/26/13 0700 12/26/13 0721 12/26/13 0748 12/26/13 0946   BP: 173/78 155/77  180/67  Pulse: 95 96  102  Temp:  98 F (36.7 C)    TempSrc:  Oral    Resp: 20 19    Height:      Weight:      SpO2: 97% 96% 92%      General: Well developed, well nourished, in no acute distress. Eating breakfast Head: Normocephalic, atraumatic, sclera non-icteric, mucus membranes are moist Neck: Supple. Mild JVD Lungs: Bilat lower lob scattered rhonchi.  Breathing is unlabored, tachypnic Heart: Tachy with S1 S2. No murmurs, rubs, or gallops appreciated. Abdomen: Soft, non-tender, non-distended with normoactive bowel sounds. No rebound/guarding. No obvious abdominal masses. M-S:  Strength and tone appear normal for age. Lower extremities: L heel foam dressing intact. Bilat pitting LE edema L>R Neuro: Alert and oriented X 3. Moves all extremities spontaneously. Psych:  Responds to questions appropriately with a normal affect. Dialysis Access: L cath  Dialysis Orders: MWF [place]. 66.5kgs  2K/2.5Ca  Cath. 400/1.5  hectorol 2 mcg IV/HD Epogen 4600 Units IV/HD  Venofer 50  Profile 4  Assessment/Plan: 1.  Dyspnea- sob started last week, r/t short HD. Lasix given in ED.  Afebril. Blood cultures and Influenza PCR pending.  Wears 2 O2 at baseline, on partial rebreather mask now.- sats 92% Chest xray- pulm edema. Workup per primary 2.  ESRD -  MWF @ [place].  Ran short HD last week d/t weather. HD pending today. K+ elevated 3.  Hypertension/volume  - 180/67, cont home metop. Volume overload on exam, HD today. edw 66.5kg now 70.4kgs.  4.  Anemia  - hgb 12.3. Hold epo, follow CBC. Cont venofer, tsat 17% outpt 5.  Metabolic bone disease - Ca 9 P 4.2 phoslo with meals. Hectorol qHD 6.  Nutrition - alb 3.5. Reports good appetite. Renal diet- encourage protein.. Multivitamin 7. COPD on home O2 8. Hx DVT/PE 9. Hx CAD/MI / low EF 30-35%  Jetty Duhamel, NP [employer] [phone] 12/26/2013, 10:10 AM   I have seen and examined patient, discussed  with PA and agree with assessment and plan as outlined above.  73 yo with ESRD on HD, HTN, hx COPD, CAD/MI, LV dysfunction, DVT/PE on home oxygen, presented with SOB and hypoxemia.  CXR showed pulm edema with effusions.  This is the third admission for these issues in the last two months.  Low BP's have hindered volume removal some, this may be related to known hx of CM with anterior akinesis and EF 30-35%.  Will plan daily HD, try to clear up fluid and effusions.  Prognosis is guarded.    [name] MD pager [phone]    cell [phone] 12/26/2013, 3:29 PM

## 2013-12-26 NOTE — Procedures (Signed)
I was present at this dialysis session, have reviewed the session itself and made  appropriate changes  [name] MD (pgr) [phone]    [phone] 12/26/2013, 3:25 PM

## 2013-12-26 NOTE — ED Notes (Signed)
Admitting doctor at bedside 

## 2013-12-26 NOTE — Progress Notes (Signed)
Pt O2 saturation decreased to 80%.  She was transitioned from nasal cannula to a partial rebreather mask.  Patient is able to maintain O2 sats >95%.  Patient appears stable and breathing is regular and unlabored.  Will continue to monitor.

## 2013-12-26 NOTE — Progress Notes (Signed)
Subjective: Patient seen and examined, admitted this morning with dyspnea. She has h/o ESRD and is on hemodialysis. Last HD was on Saturday. Filed Vitals:   12/26/13 0721  BP: 155/77  Pulse: 96  Temp: 98 F (36.7 C)  Resp: 19    Chest: Clear Bilaterally Heart : S1S2 RRR Abdomen: Soft, nontender Ext : No edema Neuro: Alert, oriented x 3  A/P Dyspnea likley due to fluid overload, Nephrology has been consulted. Hopefully she will improve with HD    Meredeth Ide [facility] Pager- [phone]

## 2013-12-26 NOTE — ED Notes (Signed)
Respiratory paged for pt to be d/c off bipap.

## 2013-12-26 NOTE — Progress Notes (Signed)
Pt taken off of BIPAP and placed on 6L [place] per MD request.

## 2013-12-26 NOTE — ED Notes (Signed)
Pt to ED via EMS for evaluation of shortness of breath- pt wears 2 liters of oxygen at home all the time, initially O2 77% on 2 L, placed on 6 L oxygen O2 91%.  Pt denies pain at present.  Alert and oriented and speaking in full sentences.

## 2013-12-27 DIAGNOSIS — I509 Heart failure, unspecified: Secondary | ICD-10-CM

## 2013-12-27 LAB — LEGIONELLA ANTIGEN, URINE: Legionella Antigen, Urine: NEGATIVE

## 2013-12-27 LAB — INFLUENZA PANEL BY PCR (TYPE A & B)
H1N1 flu by pcr: NOT DETECTED
Influenza A By PCR: NEGATIVE
Influenza B By PCR: NEGATIVE

## 2013-12-27 LAB — GLUCOSE, CAPILLARY
GLUCOSE-CAPILLARY: 158 mg/dL — AB (ref 70–99)
Glucose-Capillary: 208 mg/dL — ABNORMAL HIGH (ref 70–99)
Glucose-Capillary: 248 mg/dL — ABNORMAL HIGH (ref 70–99)
Glucose-Capillary: 191 mg/dL — ABNORMAL HIGH (ref 70–99)
Glucose-Capillary: 219 mg/dL — ABNORMAL HIGH (ref 70–99)

## 2013-12-27 LAB — TROPONIN I: Troponin I: <0.3 ng/mL (ref <0.30)

## 2013-12-27 MED ORDER — HEPARIN SODIUM (PORCINE) 1000 UNIT/ML DIALYSIS
20.0000 [IU]/kg | Freq: Once | INTRAMUSCULAR | Status: AC
  Administered 2013-12-28: 1400 [IU] via INTRAVENOUS_CENTRAL
  Filled 2013-12-27: qty 2

## 2013-12-27 MED ORDER — LIDOCAINE-PRILOCAINE 2.5-2.5 % EX CREA: 1.0000 "application " | TOPICAL_CREAM | CUTANEOUS | Status: DC | PRN

## 2013-12-27 MED ORDER — ALBUMIN HUMAN 25 % IV SOLN
25.0000 g | Freq: Once | INTRAVENOUS | Status: AC
  Administered 2013-12-27: 25 g via INTRAVENOUS
  Filled 2013-12-27: qty 100

## 2013-12-27 MED ORDER — SODIUM CHLORIDE 0.9 % IV SOLN: 100.0000 mL | INTRAVENOUS | Status: DC | PRN

## 2013-12-27 MED ORDER — NEPRO/CARBSTEADY PO LIQD: 237.0000 mL | ORAL | Status: DC | PRN

## 2013-12-27 MED ORDER — ALTEPLASE 2 MG IJ SOLR
2.0000 mg | Freq: Once | INTRAMUSCULAR | Status: AC | PRN
  Filled 2013-12-27: qty 2

## 2013-12-27 MED ORDER — IPRATROPIUM-ALBUTEROL 0.5-2.5 (3) MG/3ML IN SOLN
3.0000 mL | Freq: Four times a day (QID) | RESPIRATORY_TRACT | Status: DC
  Administered 2013-12-27 – 2013-12-30 (×10): 3 mL via RESPIRATORY_TRACT
  Filled 2013-12-27 (×10): qty 3

## 2013-12-27 MED ORDER — ALTEPLASE 2 MG IJ SOLR: 2.0000 mg | Freq: Once | INTRAMUSCULAR | Status: DC | PRN

## 2013-12-27 MED ORDER — LIDOCAINE HCL (PF) 1 % IJ SOLN: 5.0000 mL | INTRAMUSCULAR | Status: DC | PRN

## 2013-12-27 MED ORDER — ALBUMIN HUMAN 25 % IV SOLN
INTRAVENOUS | Status: AC
  Administered 2013-12-27: 25 g via INTRAVENOUS
  Filled 2013-12-27: qty 100

## 2013-12-27 MED ORDER — HEPARIN SODIUM (PORCINE) 1000 UNIT/ML DIALYSIS: 1000.0000 [IU] | INTRAMUSCULAR | Status: DC | PRN

## 2013-12-27 MED ORDER — PENTAFLUOROPROP-TETRAFLUOROETH EX AERO: 1.0000 "application " | INHALATION_SPRAY | CUTANEOUS | Status: DC | PRN

## 2013-12-27 MED ORDER — HEPARIN SODIUM (PORCINE) 1000 UNIT/ML DIALYSIS
1000.0000 [IU] | INTRAMUSCULAR | Status: DC | PRN
  Filled 2013-12-27: qty 1

## 2013-12-27 NOTE — Progress Notes (Signed)
Attepmted to Wean pt. Off non-rebreather. Put pt. On 6L [place]. Pt. O2 Sats in the low 90s.  0133: pt. O2 sats in the low 80s. Pt. Put back on nonrebreather. O2 sats return to 100%. Will continue to monitor

## 2013-12-27 NOTE — Progress Notes (Signed)
Respiratory therapy note-no pulmicort available at this time.

## 2013-12-27 NOTE — Progress Notes (Signed)
[facility] PROGRESS NOTE  KALA PILAT DHW:[id number] DOB: Apr 30, 1941 DOA: 12/26/2013 PCP: Quentin Mulling, MD  Assessment/Plan:  Acute on chronic  respiratory failure with hypoxia  Resolved Sec to pulmonary edema. Strep pneumo antigen is negative Influenza pcr has been obtained, results are pending on Tamiflu empirically  ESRD Nephrology is consulted for hemodialysis  Had HD yestrday  Coronary artery disease  stable Telemetry monitoring continue aspirin follow serial troponins   Diabetes mellitus  Continue insulin and placed on sliding scale   Hypothyroidism  continue Synthroid  Hypertension Continue with Lopressor   Code Status: Full code Family Communication: * Disposition Plan: Home when stable after HD in am   Consultants:  Nephrology  Procedures:  None  Antibiotics:  *None  HPI/Subjective: Patient seen and examined, breathing is better.She is on home oxygen all the time.  Objective: Filed Vitals:   12/27/13 0732  BP: 148/67  Pulse: 90  Temp: 97.3 F (36.3 C)  Resp: 20    Intake/Output Summary (Last 24 hours) at 12/27/13 0805 Last data filed at 12/27/13 0211  Gross per 24 hour  Intake      0 ml  Output   2629 ml  Net  -2629 ml   Filed Weights   12/26/13 1311 12/26/13 1724 12/27/13 0325  Weight: 69.6 kg (153 lb 7 oz) 67.6 kg (149 lb 0.5 oz) 67.6 kg (149 lb 0.5 oz)    Exam:  Physical Exam: Head: Normocephalic, atraumatic.  Eyes: No signs of jaundice, EOMI Nose: Mucous membranes dry.  Throat: Oropharynx nonerythematous, no exudate appreciated.  Neck: supple,No deformities, masses, or tenderness noted. Lungs: Normal respiratory effort. B/L Clear to auscultation, no crackles or wheezes.  Heart: Regular RR. S1 and S2 normal  Abdomen: BS normoactive. Soft, Nondistended, non-tender.  Extremities: No pretibial edema, no erythema   Data Reviewed: Basic Metabolic Panel:  Recent Labs Lab 12/26/13 0334 12/26/13 1326  NA 139  143  K 5.9* 4.2  CL 96 98  CO2 30 34*  GLUCOSE 114* 195*  BUN 39* 42*  CREATININE 3.40* 3.55*  CALCIUM 9.0 8.9  PHOS  --  4.2   Liver Function Tests:  Recent Labs Lab 12/26/13 0334 12/26/13 1326  AST 52*  --   ALT 37*  --   ALKPHOS 125*  --   BILITOT <0.2*  --   PROT 7.1  --   ALBUMIN 3.5 3.1*   No results found for this basename: LIPASE, AMYLASE,  in the last 168 hours No results found for this basename: AMMONIA,  in the last 168 hours CBC:  Recent Labs Lab 12/26/13 0334 12/26/13 1326  WBC 10.6* 9.7  NEUTROABS 8.6*  --   HGB 12.3 10.7*  HCT 40.3 35.1*  MCV 98.1 97.2  PLT 353 305   Cardiac Enzymes: No results found for this basename: CKTOTAL, CKMB, CKMBINDEX, TROPONINI,  in the last 168 hours BNP (last 3 results)  Recent Labs  10/26/13 0510 11/07/13 0455  PROBNP 9552.0* 22434.0*   CBG:  Recent Labs Lab 12/26/13 0526 12/26/13 0719 12/26/13 1212 12/26/13 1758 12/26/13 2157  GLUCAP 129* 141* 209* 98 208*    No results found for this or any previous visit (from the past 240 hour(s)).   Studies: Dg Chest Port 1 View  12/26/2013   CLINICAL DATA:  Shortness of breath.  EXAM: PORTABLE CHEST - 1 VIEW  COMPARISON:  Chest radiograph performed 12/07/2013  FINDINGS: The lungs are well-aerated. Small to moderate bilateral pleural effusions are seen, with  bibasilar airspace opacification. Underlying vascular congestion is noted. Findings are concerning for pulmonary edema. No pneumothorax is identified.  The cardiomediastinal silhouette is borderline normal in size. A left-sided dual lumen catheter is seen ending about the distal SVC. No acute osseous abnormalities are seen.  IMPRESSION: Small to moderate bilateral pleural effusions, with bibasilar airspace opacification. Underlying vascular congestion noted. Findings concerning for pulmonary edema.   Electronically Signed   By: Roanna RaiderJeffery  Chang M.D.   On: 12/26/2013 04:09    Scheduled Meds: . albuterol  2.5 mg  Nebulization Q6H  . aspirin EC  81 mg Oral Daily  . budesonide  0.25 mg Nebulization BID  . calcium acetate  667 mg Oral TID WC  . doxercalciferol  2 mcg Intravenous Q M,W,F-HD  . [START ON 12/28/2013] ferric gluconate (FERRLECIT/NULECIT) IV  62.5 mg Intravenous Weekly  . guaiFENesin  600 mg Oral BID  . heparin  5,000 Units Subcutaneous 3 times per day  . insulin aspart  0-15 Units Subcutaneous TID WC  . insulin aspart  0-5 Units Subcutaneous QHS  . insulin glargine  15 Units Subcutaneous QHS  . ipratropium  0.5 mg Nebulization Q6H  . isosorbide mononitrate  120 mg Oral Daily  . levothyroxine  25 mcg Oral QAC breakfast  . metoprolol tartrate  12.5 mg Oral BID  . multivitamin  1 tablet Oral QHS  . pantoprazole  40 mg Oral Daily  . simvastatin  10 mg Oral QPM  . sodium chloride  3 mL Intravenous Q12H   Continuous Infusions:   Principal Problem:   Acute respiratory failure with hypoxia Active Problems:   Hypothyroidism   History of DVT (deep vein thrombosis)   CAD (coronary artery disease)   Diabetes   ESRD (end stage renal disease)    Time spent: *25 min    [facility] LtdAMA,[place] Nies S  [facility] Pager [phone]. If 7PM-7AM, please contact night-coverage at [email or web address], password [email or web address] 12/27/2013, 8:05 AM  LOS: 1 day

## 2013-12-27 NOTE — Progress Notes (Signed)
   [facility] Progress Note   Subjective: Stable, no SOB today, on 6L [place] O2.   2KG off w HD yest with BP drop near end  Filed Vitals:   12/27/13 0732 12/27/13 0849 12/27/13 1253 12/27/13 1300  BP: 148/67  166/65   Pulse: 90  103   Temp: 97.3 F (36.3 C)  97.5 F (36.4 C)   TempSrc: Oral  Oral   Resp: 20  27   Height:      Weight:      SpO2: 99% 92% 92% 98%   Exam Up in bed, not in distress No jvd Faint bibasilar rales RRR no MRG Abd soft, nt, nd No LE edema L IJ cath  Dialysis: MWF [place] 66.5kg  2K/2.5 [place]  L IJ Cath 400/1.5  Prof 4 Hectorol 2     Epo 4600     Venofer 50/wk   Assessment: 1 Dyspnea / vol excess / pleural effusion- HD to decrease volume 2 ESRD on hemodialysis 3 HTN/vol- as above 4 Anemia- Hb up, hold esa 5 2HPT- cont binders/D 6 COPD on home O2 7 Hx CAD/MI, low EF 30-35% 8 Hx DVT/PE   Plan- HD daily until vol excess resolved, f/u cxr tomorrow after HD    [name] MD  pager [phone]    cell [phone]  12/27/2013, 1:58 PM     Recent Labs Lab 12/26/13 0334 12/26/13 1326  NA 139 143  K 5.9* 4.2  CL 96 98  CO2 30 34*  GLUCOSE 114* 195*  BUN 39* 42*  CREATININE 3.40* 3.55*  CALCIUM 9.0 8.9  PHOS  --  4.2    Recent Labs Lab 12/26/13 0334 12/26/13 1326  AST 52*  --   ALT 37*  --   ALKPHOS 125*  --   BILITOT <0.2*  --   PROT 7.1  --   ALBUMIN 3.5 3.1*    Recent Labs Lab 12/26/13 0334 12/26/13 1326  WBC 10.6* 9.7  NEUTROABS 8.6*  --   HGB 12.3 10.7*  HCT 40.3 35.1*  MCV 98.1 97.2  PLT 353 305   . aspirin EC  81 mg Oral Daily  . budesonide  0.25 mg Nebulization BID  . calcium acetate  667 mg Oral TID WC  . doxercalciferol  2 mcg Intravenous Q M,W,F-HD  . [START ON 12/28/2013] ferric gluconate (FERRLECIT/NULECIT) IV  62.5 mg Intravenous Weekly  . guaiFENesin  600 mg Oral BID  . heparin  5,000 Units Subcutaneous 3 times per day  . insulin aspart  0-15 Units Subcutaneous TID WC  . insulin aspart   0-5 Units Subcutaneous QHS  . insulin glargine  15 Units Subcutaneous QHS  . ipratropium-albuterol  3 mL Nebulization Q6H  . isosorbide mononitrate  120 mg Oral Daily  . levothyroxine  25 mcg Oral QAC breakfast  . metoprolol tartrate  12.5 mg Oral BID  . multivitamin  1 tablet Oral QHS  . pantoprazole  40 mg Oral Daily  . simvastatin  10 mg Oral QPM  . sodium chloride  3 mL Intravenous Q12H     sodium chloride, sodium chloride, sodium chloride, sodium chloride, acetaminophen, acetaminophen, alteplase, alteplase, feeding supplement (NEPRO CARB STEADY), feeding supplement (NEPRO CARB STEADY), heparin, heparin, lidocaine (PF), lidocaine (PF), lidocaine-prilocaine, lidocaine-prilocaine, nitroGLYCERIN, ondansetron (ZOFRAN) IV, ondansetron, pentafluoroprop-tetrafluoroeth, pentafluoroprop-tetrafluoroeth

## 2013-12-28 ENCOUNTER — Inpatient Hospital Stay (HOSPITAL_COMMUNITY): Payer: Medicare Other

## 2013-12-28 DIAGNOSIS — J441 Chronic obstructive pulmonary disease with (acute) exacerbation: Secondary | ICD-10-CM

## 2013-12-28 LAB — RENAL FUNCTION PANEL
ALBUMIN: 3.5 g/dL (ref 3.5–5.2)
BUN: 14 mg/dL (ref 6–23)
CHLORIDE: 97 meq/L (ref 96–112)
CO2: 28 meq/L (ref 19–32)
Calcium: 8.9 mg/dL (ref 8.4–10.5)
Creatinine, Ser: 1.96 mg/dL — ABNORMAL HIGH (ref 0.50–1.10)
GFR, EST AFRICAN AMERICAN: 29 mL/min — AB (ref >90)
GFR, EST NON AFRICAN AMERICAN: 25 mL/min — AB (ref >90)
Glucose, Bld: 75 mg/dL (ref 70–99)
POTASSIUM: 3.4 meq/L — AB (ref 3.7–5.3)
Phosphorus: 1.5 mg/dL — ABNORMAL LOW (ref 2.3–4.6)
Sodium: 137 mEq/L (ref 137–147)

## 2013-12-28 LAB — CBC
HCT: 35 % — ABNORMAL LOW (ref 36.0–46.0)
Hemoglobin: 10.7 g/dL — ABNORMAL LOW (ref 12.0–15.0)
MCH: 29.5 pg (ref 26.0–34.0)
MCHC: 30.6 g/dL (ref 30.0–36.0)
MCV: 96.4 fL (ref 78.0–100.0)
PLATELETS: 248 10*3/uL (ref 150–400)
RBC: 3.63 MIL/uL — AB (ref 3.87–5.11)
RDW: 16.9 % — ABNORMAL HIGH (ref 11.5–15.5)
WBC: 9.1 10*3/uL (ref 4.0–10.5)

## 2013-12-28 LAB — GLUCOSE, CAPILLARY
GLUCOSE-CAPILLARY: 157 mg/dL — AB (ref 70–99)
GLUCOSE-CAPILLARY: 374 mg/dL — AB (ref 70–99)
Glucose-Capillary: 122 mg/dL — ABNORMAL HIGH (ref 70–99)

## 2013-12-28 MED ORDER — SODIUM CHLORIDE 0.9 % IV SOLN: 100.0000 mL | INTRAVENOUS | Status: DC | PRN

## 2013-12-28 MED ORDER — ALTEPLASE 2 MG IJ SOLR
2.0000 mg | Freq: Once | INTRAMUSCULAR | Status: AC | PRN
  Filled 2013-12-28: qty 2

## 2013-12-28 MED ORDER — PENTAFLUOROPROP-TETRAFLUOROETH EX AERO: 1.0000 "application " | INHALATION_SPRAY | CUTANEOUS | Status: DC | PRN

## 2013-12-28 MED ORDER — LIDOCAINE HCL (PF) 1 % IJ SOLN: 5.0000 mL | INTRAMUSCULAR | Status: DC | PRN

## 2013-12-28 MED ORDER — INSULIN GLARGINE 100 UNIT/ML ~~LOC~~ SOLN
17.0000 [IU] | Freq: Every day | SUBCUTANEOUS | Status: DC
  Administered 2013-12-28 – 2013-12-30 (×3): 17 [IU] via SUBCUTANEOUS
  Filled 2013-12-28 (×3): qty 0.17

## 2013-12-28 MED ORDER — NEPRO/CARBSTEADY PO LIQD
237.0000 mL | ORAL | Status: DC | PRN
  Filled 2013-12-28: qty 237

## 2013-12-28 MED ORDER — LIDOCAINE-PRILOCAINE 2.5-2.5 % EX CREA: 1.0000 "application " | TOPICAL_CREAM | CUTANEOUS | Status: DC | PRN

## 2013-12-28 MED ORDER — DOXERCALCIFEROL 4 MCG/2ML IV SOLN
INTRAVENOUS | Status: AC
  Administered 2013-12-28: 2 ug via INTRAVENOUS
  Filled 2013-12-28: qty 2

## 2013-12-28 MED ORDER — HEPARIN SODIUM (PORCINE) 1000 UNIT/ML DIALYSIS
1000.0000 [IU] | INTRAMUSCULAR | Status: DC | PRN
  Filled 2013-12-28: qty 1

## 2013-12-28 MED ORDER — INSULIN ASPART 100 UNIT/ML ~~LOC~~ SOLN
2.0000 [IU] | Freq: Three times a day (TID) | SUBCUTANEOUS | Status: DC
  Administered 2013-12-28 – 2013-12-30 (×6): 2 [IU] via SUBCUTANEOUS

## 2013-12-28 NOTE — Progress Notes (Signed)
Patient transferred to HD via bed.

## 2013-12-28 NOTE — Progress Notes (Addendum)
[facility] PROGRESS NOTE  [name] SnipeJudy A [name] ZOX:[id number] DOB: 06/12/1941 DOA: 12/26/2013 PCP: [name] MullingHOOPER,[name], MD  Assessment/Plan:  Acute on chronic  respiratory failure with hypoxia  - currently on 4 L O2- has been ordered 2 L in the past to use at home Sec to pulmonary edema. Strep pneumo antigen is negative Influenza pcr is negative  D/[name] Tamiflu   Gr pos cocci in 1 set of blood cultures - likely contaminant  ESRD  hemodialysis per nephrology  Coronary artery disease  - stable - continue aspirin -negative troponins x 3  Diabetes mellitus  Increase Lantus and add mealtime inusulin  Hypothyroidism  continue Synthroid  Hypertension Continue with Lopressor   Code Status: Full code Family Communication: none Disposition Plan: Home when stable after HD in am   Consultants:  Nephrology  Procedures:  None  Antibiotics:  *None  HPI/Subjective: Pt sitting up in bed. No complaints today.   Objective: Filed Vitals:   12/28/13 0800  BP: 148/64  Pulse: 90  Temp: 98.1 F (36.7 [name])  Resp: 23    Intake/Output Summary (Last 24 hours) at 12/28/13 1024 Last data filed at 12/28/13 0936  Gross per 24 hour  Intake    100 ml  Output   2689 ml  Net  -2589 ml   Filed Weights   12/27/13 1417 12/27/13 1722 12/28/13 0331  Weight: 70 kg (154 lb 5.2 oz) 67.5 kg (148 lb 13 oz) 67.5 kg (148 lb 13 oz)    Exam:  Physical Exam: Head: Normocephalic, atraumatic.  Eyes: No signs of jaundice, EOMI Nose: Mucous membranes dry.  Throat: Oropharynx nonerythematous, no exudate appreciated.  Neck: supple,No deformities, masses, or tenderness noted. Lungs: Normal respiratory effort. B/L Clear to auscultation, no crackles or wheezes.  Heart: Regular RR. S1 and S2 normal  Abdomen: BS normoactive. Soft, Nondistended, non-tender.  Extremities: No pretibial edema, no erythema   Data Reviewed: Basic Metabolic Panel:  Recent Labs Lab 12/26/13 0334 12/26/13 1326  NA 139  143  K 5.9* 4.2  CL 96 98  CO2 30 34*  GLUCOSE 114* 195*  BUN 39* 42*  CREATININE 3.40* 3.55*  CALCIUM 9.0 8.9  PHOS  --  4.2   Liver Function Tests:  Recent Labs Lab 12/26/13 0334 12/26/13 1326  AST 52*  --   ALT 37*  --   ALKPHOS 125*  --   BILITOT <0.2*  --   PROT 7.1  --   ALBUMIN 3.5 3.1*   No results found for this basename: LIPASE, AMYLASE,  in the last 168 hours No results found for this basename: AMMONIA,  in the last 168 hours CBC:  Recent Labs Lab 12/26/13 0334 12/26/13 1326  WBC 10.6* 9.7  NEUTROABS 8.6*  --   HGB 12.3 10.7*  HCT 40.3 35.1*  MCV 98.1 97.2  PLT 353 305   Cardiac Enzymes:  Recent Labs Lab 12/27/13 0811 12/27/13 1411 12/27/13 2023  TROPONINI <0.30 <0.30 <0.30   BNP (last 3 results)  Recent Labs  10/26/13 0510 11/07/13 0455  PROBNP 9552.0* 22434.0*   CBG:  Recent Labs Lab 12/27/13 0735 12/27/13 1250 12/27/13 1805 12/27/13 2140 12/28/13 0729  GLUCAP 158* 248* 191* 219* 122*    Recent Results (from the past 240 hour(s))  CULTURE, BLOOD (ROUTINE X 2)     Status: None   Collection Time    12/26/13  4:15 AM      Result Value Ref Range Status   Specimen Description BLOOD RIGHT ARM  Final   Special Requests BOTTLES DRAWN AEROBIC AND ANAEROBIC 10CC   Final   Culture  Setup Time     Final   Value: 12/26/2013 11:09     Performed at [facility]   Culture     Final   Value:        BLOOD CULTURE RECEIVED NO GROWTH TO DATE CULTURE WILL BE HELD FOR 5 DAYS BEFORE ISSUING A FINAL NEGATIVE REPORT     Performed at [facility]   Report Status PENDING   Incomplete  CULTURE, BLOOD (ROUTINE X 2)     Status: None   Collection Time    12/26/13  4:20 AM      Result Value Ref Range Status   Specimen Description BLOOD RIGHT WRIST   Final   Special Requests BOTTLES DRAWN AEROBIC ONLY 5CC   Final   Culture  Setup Time     Final   Value: 12/26/2013 11:10     Performed at [facility]   Culture     Final    Value:        BLOOD CULTURE RECEIVED NO GROWTH TO DATE CULTURE WILL BE HELD FOR 5 DAYS BEFORE ISSUING A FINAL NEGATIVE REPORT     Performed at [facility]   Report Status PENDING   Incomplete     Studies: No results found.  Scheduled Meds: . aspirin EC  81 mg Oral Daily  . budesonide  0.25 mg Nebulization BID  . calcium acetate  667 mg Oral TID WC  . doxercalciferol  2 mcg Intravenous Q M,W,F-HD  . ferric gluconate (FERRLECIT/NULECIT) IV  62.5 mg Intravenous Weekly  . guaiFENesin  600 mg Oral BID  . heparin  20 Units/kg Dialysis Once in dialysis  . heparin  5,000 Units Subcutaneous 3 times per day  . insulin aspart  0-15 Units Subcutaneous TID WC  . insulin aspart  0-5 Units Subcutaneous QHS  . insulin aspart  2 Units Subcutaneous TID WC  . insulin glargine  17 Units Subcutaneous QHS  . ipratropium-albuterol  3 mL Nebulization Q6H  . isosorbide mononitrate  120 mg Oral Daily  . levothyroxine  25 mcg Oral QAC breakfast  . metoprolol tartrate  12.5 mg Oral BID  . multivitamin  1 tablet Oral QHS  . pantoprazole  40 mg Oral Daily  . simvastatin  10 mg Oral QPM  . sodium chloride  3 mL Intravenous Q12H   Continuous Infusions:      Time spent: *25 min    Karrisa Didio, MD [facility] Pager [phone]. If 7PM-7AM, please contact night-coverage at [email or web address], password [facility] 12/28/2013, 10:24 AM  LOS: 2 days

## 2013-12-28 NOTE — Progress Notes (Signed)
  [facility] Progress Note   Subjective: Stable, no SOB today, on 6L [place] O2.   2KG off w HD yest with BP drop near end  Filed Vitals:   12/27/13 2332 12/28/13 0331 12/28/13 0800 12/28/13 0920  BP: 126/45 135/57 148/64   Pulse: 101 87 90   Temp: 97.8 F (36.6 C) 97.7 F (36.5 C) 98.1 F (36.7 C)   TempSrc: Oral Oral Oral   Resp: 17 21 23    Height:      Weight:  67.5 kg (148 lb 13 oz)    SpO2: 98% 99% 98% 99%   Exam Up in bed, not in distress No jvd Faint bibasilar rales RRR no MRG Abd soft, nt, nd No LE edema L IJ cath  Dialysis: MWF [place] 66.5kg  2K/2.5 [place]  L IJ Cath 400/1.5  Prof 4 Hectorol 2     Epo 4600     Venofer 50/wk   Assessment: 1 Dyspnea / vol excess / pleural effusion- cont daily HD for vol removal 2 ESRD on hemodialysis 3 HTN/vol- as above 4 Anemia- Hb up, hold esa 5 2HPT- cont binders/D 6 COPD on home O2 7 Hx CAD/MI, low EF 30-35% 8 Hx DVT/PE   Plan- HD daily for now, UF 2-3 kg as BP tolerates    [name] MD  pager [phone]    cell [phone]  12/28/2013, 11:02 AM     Recent Labs Lab 12/26/13 0334 12/26/13 1326  NA 139 143  K 5.9* 4.2  CL 96 98  CO2 30 34*  GLUCOSE 114* 195*  BUN 39* 42*  CREATININE 3.40* 3.55*  CALCIUM 9.0 8.9  PHOS  --  4.2    Recent Labs Lab 12/26/13 0334 12/26/13 1326  AST 52*  --   ALT 37*  --   ALKPHOS 125*  --   BILITOT <0.2*  --   PROT 7.1  --   ALBUMIN 3.5 3.1*    Recent Labs Lab 12/26/13 0334 12/26/13 1326  WBC 10.6* 9.7  NEUTROABS 8.6*  --   HGB 12.3 10.7*  HCT 40.3 35.1*  MCV 98.1 97.2  PLT 353 305   . aspirin EC  81 mg Oral Daily  . budesonide  0.25 mg Nebulization BID  . calcium acetate  667 mg Oral TID WC  . doxercalciferol  2 mcg Intravenous Q M,W,F-HD  . ferric gluconate (FERRLECIT/NULECIT) IV  62.5 mg Intravenous Weekly  . guaiFENesin  600 mg Oral BID  . heparin  20 Units/kg Dialysis Once in dialysis  . heparin  5,000 Units Subcutaneous 3 times per  day  . insulin aspart  0-15 Units Subcutaneous TID WC  . insulin aspart  0-5 Units Subcutaneous QHS  . insulin aspart  2 Units Subcutaneous TID WC  . insulin glargine  17 Units Subcutaneous QHS  . ipratropium-albuterol  3 mL Nebulization Q6H  . isosorbide mononitrate  120 mg Oral Daily  . levothyroxine  25 mcg Oral QAC breakfast  . metoprolol tartrate  12.5 mg Oral BID  . multivitamin  1 tablet Oral QHS  . pantoprazole  40 mg Oral Daily  . simvastatin  10 mg Oral QPM  . sodium chloride  3 mL Intravenous Q12H     sodium chloride, sodium chloride, acetaminophen, acetaminophen, feeding supplement (NEPRO CARB STEADY), heparin, lidocaine (PF), lidocaine-prilocaine, nitroGLYCERIN, ondansetron (ZOFRAN) IV, ondansetron, pentafluoroprop-tetrafluoroeth

## 2013-12-28 NOTE — Procedures (Signed)
I was present at this dialysis session, have reviewed the session itself and made  appropriate changes  [name] MD (pgr) [phone]    ([phone] 12/28/2013, 4:20 PM

## 2013-12-29 DIAGNOSIS — D649 Anemia, unspecified: Secondary | ICD-10-CM

## 2013-12-29 LAB — GLUCOSE, CAPILLARY
GLUCOSE-CAPILLARY: 179 mg/dL — AB (ref 70–99)
Glucose-Capillary: 170 mg/dL — ABNORMAL HIGH (ref 70–99)
Glucose-Capillary: 137 mg/dL — ABNORMAL HIGH (ref 70–99)
Glucose-Capillary: 137 mg/dL — ABNORMAL HIGH (ref 70–99)

## 2013-12-29 LAB — CULTURE, BLOOD (ROUTINE X 2)

## 2013-12-29 MED ORDER — MIDODRINE HCL 5 MG PO TABS
10.0000 mg | ORAL_TABLET | Freq: Two times a day (BID) | ORAL | Status: DC
  Administered 2013-12-29 – 2013-12-30 (×3): 10 mg via ORAL
  Filled 2013-12-29 (×6): qty 2

## 2013-12-29 NOTE — Progress Notes (Addendum)
[facility] Progress Note   Subjective: 1.9kg off yest, 6.5 kg total UF this admit but wt only down 3kg  Filed Vitals:   12/29/13 0201 12/29/13 0301 12/29/13 0604 12/29/13 0937  BP: 119/54  143/56   Pulse: 97 88 95   Temp: 98.2 F (36.8 C)  98.2 F (36.8 C)   TempSrc: Oral  Oral   Resp: 20 16 16    Height:      Weight:   67.087 kg (147 lb 14.4 oz)   SpO2: 92%  93% 95%   Exam Up in bed, not in distress No jvd Faint bibasilar rales RRR no MRG Abd soft, nt, nd No LE edema L IJ cath  ECHO - LV 30%, RV ok CXR- 2/23, 2/25 > bilat effusions, no overt edema Wt admit 70kg, lowest 65.5kg  Dialysis: MWF [place] 4h  66.5kg  2K/2.5 [place]  L IJ Cath 400/1.5  Prof 4 Hectorol 2     Epo 4600     Venofer 50/wk   Assessment: 1 Dyspnea / vol excess / pleural effusions 2 ESRD on hemodialysis 3 HTN/vol- as above 4 Anemia- Hb up, hold esa 5 2HPT- cont binders/D 6 COPD on home O2 7 Hx CAD/MI, low EF 30-35% 8 Hx DVT/PE   Plan- wt down somewhat but CXR still bilat effusions, unchanged. Low BP's an issue, have stopped metoprolol and will start midodrine pre HD. HD in am. Prognosis is guarded. I spoke with patient about EOL issues.  She wishes for DNR, no heroic measures in case of cardioresp arrest. Wants to cont HD for now.  RN was present during discussion. Will write DNR order.  She still needs daily HD.      [name] MD  pager [phone]    cell [phone]  12/29/2013, 10:48 AM     Recent Labs Lab 12/26/13 0334 12/26/13 1326 12/28/13 1444  NA 139 143 137  K 5.9* 4.2 3.4*  CL 96 98 97  CO2 30 34* 28  GLUCOSE 114* 195* 75  BUN 39* 42* 14  CREATININE 3.40* 3.55* 1.96*  CALCIUM 9.0 8.9 8.9  PHOS  --  4.2 1.5*    Recent Labs Lab 12/26/13 0334 12/26/13 1326 12/28/13 1444  AST 52*  --   --   ALT 37*  --   --   ALKPHOS 125*  --   --   BILITOT <0.2*  --   --   PROT 7.1  --   --   ALBUMIN 3.5 3.1* 3.5    Recent Labs Lab 12/26/13 0334  12/26/13 1326 12/28/13 1444  WBC 10.6* 9.7 9.1  NEUTROABS 8.6*  --   --   HGB 12.3 10.7* 10.7*  HCT 40.3 35.1* 35.0*  MCV 98.1 97.2 96.4  PLT 353 305 248   . aspirin EC  81 mg Oral Daily  . budesonide  0.25 mg Nebulization BID  . calcium acetate  667 mg Oral TID WC  . doxercalciferol  2 mcg Intravenous Q M,W,F-HD  . ferric gluconate (FERRLECIT/NULECIT) IV  62.5 mg Intravenous Weekly  . guaiFENesin  600 mg Oral BID  . heparin  5,000 Units Subcutaneous 3 times per day  . insulin aspart  0-15 Units Subcutaneous TID WC  . insulin aspart  0-5 Units Subcutaneous QHS  . insulin aspart  2 Units Subcutaneous TID WC  . insulin glargine  17 Units Subcutaneous QHS  . ipratropium-albuterol  3 mL Nebulization Q6H  . isosorbide mononitrate  120 mg Oral  Daily  . levothyroxine  25 mcg Oral QAC breakfast  . midodrine  10 mg Oral BID WC  . multivitamin  1 tablet Oral QHS  . pantoprazole  40 mg Oral Daily  . simvastatin  10 mg Oral QPM  . sodium chloride  3 mL Intravenous Q12H     sodium chloride, sodium chloride, acetaminophen, acetaminophen, feeding supplement (NEPRO CARB STEADY), heparin, lidocaine (PF), lidocaine-prilocaine, nitroGLYCERIN, ondansetron (ZOFRAN) IV, ondansetron, pentafluoroprop-tetrafluoroeth

## 2013-12-29 NOTE — Progress Notes (Signed)
Patient alert and oriented x4, states she is tired but that she has no other concerns or questions at this time.  Plan is for patient to dialyze an additional day today.  Patient denies any questions or concerns at this time; will continue to monitor.

## 2013-12-29 NOTE — Progress Notes (Signed)
Wet to dry dressing done on patients left heel. Protegl heel protector applied. RN will continue to monitor. Louretta Parma, RN

## 2013-12-29 NOTE — Procedures (Signed)
I was present at this dialysis session, have reviewed the session itself and made  appropriate changes  Rob Ell Tiso MD (pgr) [phone]    (c) [phone] 12/29/2013, 12:55 PM   

## 2013-12-29 NOTE — Consult Note (Signed)
WOC wound consult note Reason for Consult: Left heel pressure ulcer.  Patient is followed by outpatient wound care center.  Plan is to return to their care and follow up post discharge. Wound type:Pressure Pressure Ulcer POA: Yes Measurement:  1.8cm x 1cm x 0.5cm.  . Wound bed:  75% yellow fibrinous slough at periphery, 25% deep red tissue at base. Drainage (amount, consistency, odor) None Periwound:intact, dry Dressing procedure/placement/frequency: Notified of this area today.  A twice-daily saline dressing is implemented along with pressure redistribution to the affected area via use of a Prevalon boot while patient is in bed or in a recliner chair. Patient may be discharged later today and as stated above, plan is to continue follow-up by outpatient wound care center. WOC nursing team will not follow, but will remain available to this patient, the nursing and medical teams.  Please re-consult if needed. Thanks, Ladona Mow, MSN, RN, GNP, [place], CWON-AP [phone])

## 2013-12-29 NOTE — Progress Notes (Addendum)
[facility] PROGRESS NOTE  [name] [name] [id number] DOB: 01/26/1941 DOA: 12/26/2013 PCP: [name],[name], MD  Brief narrative: 73 y.o. female with past medical history of ERSD on HD MWF, recent admission in 11/2013 with pneumonia and CHF exacerbation, diabetes, dyslipidemia, hypothyroidism who [facility] with severe leg pain with dialysis on the day of this admission. Her leg pain resolved but in [facility] ER she was found to be hypoxic with oxygen saturation of 78% on 2L [place] oxygen support. There was no shortness of breath on admission. [facility] and management.   Assessment/Plan:  Principal Problem: Acute respiratory failure with hypoxia  - perhaps due to volume overload, bilateral pleural effusion but no over pulmonary edema seen on CXR done 2/25 - now on 6 L  oxygen support to keep O2 saturation above 90%. Continue duoneb every 6 hours scheduled  - her strep pneumonia and influenza were negative on admission. Tamiflu was discontinues as influenza CPR negative   Active Problems: Gram positive cocci in clusters in blood culture - in 1 set only; not on abx - likely a contaminant - follow closely final result ESRD on HD MWF - HD per renal - appreciate renal team following  - continue phoslo, hectorol and ferric gluconate per renal  Coronary artery disease  - stable  - continue aspirin  - troponin level WNL Diabetes mellitus with complications of ESRD  - current insulin regimen: Lantus 17 units at bedtime and Novolog 2 units TID  - continue sliding scale insulin - CBG's in past 24 hours: 157, 374, 170 Hypothyroidism  - continue Synthroid 25 mcg daily Hypertension  - Continue Imdur 120 mg daily 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  - wound measurement: 1.8cm x 1cm x  0.5cm - dressing procedure:  twice-daily saline dressing with pressure redistribution to the affected area via use of a Prevalon boot while patient is in bed or in a recliner chair.   Code Status: DNR/DNI Family Communication: no family at the bedside Disposition Plan: home when stable  Manson PasseyEVINE, [name], MD  [facility] Pager [phone]  If 7PM-7AM, please contact night-coverage [email or web address] Password [email or web address] 12/29/2013, 8:53 AM   LOS: 3 days   Consultants:  Wound care  Nephrology   Procedures:  None   Antibiotics:  None   HPI/Subjective: No acute overnight events.  Objective: Filed Vitals:   12/28/13 2220 12/29/13 0201 12/29/13 0301 12/29/13 0604  BP: 132/44 119/54  143/56  Pulse: 106 97 88 95  Temp: 97.8 F (36.6 C) 98.2 F (36.8 C)  98.2 F (36.8 C)  TempSrc: Oral Oral  Oral  Resp: 18 20 16 16   Height:      Weight:    67.087 kg (147 lb 14.4 oz)  SpO2: 90% 92%  93%    Intake/Output Summary (Last 24 hours) at 12/29/13 0853 Last data filed at 12/29/13 0853  Gross per 24 hour  Intake    360 ml  Output   2028 ml  Net  -1668 ml    Exam:   General:  Pt is alert, follows commands appropriately, not in acute distress  Cardiovascular: Regular rate and rhythm, S1/S2, no murmurs, no rubs, no gallops  Respiratory: Clear to auscultation bilaterally, no wheezing, no crackles, no rhonchi  Abdomen: Soft, non tender, non distended, bowel sounds  present, no guarding  Extremities: No edema, pulses DP and PT palpable bilaterally  Neuro: Grossly nonfocal  Data Reviewed: Basic Metabolic Panel:  Recent Labs Lab 12/26/13 0334 12/26/13 1326 12/28/13 1444  NA 139 143 137  K 5.9* 4.2 3.4*  CL 96 98 97  CO2 30 34* 28  GLUCOSE 114* 195* 75  BUN 39* 42* 14  CREATININE 3.40* 3.55* 1.96*  CALCIUM 9.0 8.9 8.9  PHOS  --  4.2 1.5*   Liver Function Tests:  Recent Labs Lab 12/26/13 0334 12/26/13 1326 12/28/13 1444  AST 52*  --   --   ALT 37*  --   --    ALKPHOS 125*  --   --   BILITOT <0.2*  --   --   PROT 7.1  --   --   ALBUMIN 3.5 3.1* 3.5   No results found for this basename: LIPASE, AMYLASE,  in the last 168 hours No results found for this basename: AMMONIA,  in the last 168 hours CBC:  Recent Labs Lab 12/26/13 0334 12/26/13 1326 12/28/13 1444  WBC 10.6* 9.7 9.1  NEUTROABS 8.6*  --   --   HGB 12.3 10.7* 10.7*  HCT 40.3 35.1* 35.0*  MCV 98.1 97.2 96.4  PLT 353 305 248   Cardiac Enzymes:  Recent Labs Lab 12/27/13 0811 12/27/13 1411 12/27/13 2023  TROPONINI <0.30 <0.30 <0.30   BNP: No components found with this basename: POCBNP,  CBG:  Recent Labs Lab 12/27/13 2140 12/28/13 0729 12/28/13 1121 12/28/13 2044 12/29/13 0600  GLUCAP 219* 122* 157* 374* 170*    CULTURE, BLOOD (ROUTINE X 2)     Status: None   Collection Time    12/26/13  4:15 AM      Result Value Ref Range Status   Specimen Description BLOOD RIGHT ARM   Final   Value:        BLOOD CULTURE RECEIVED NO GROWTH TO DATE      Performed at [facility]   Report Status PENDING   Incomplete  CULTURE, BLOOD (ROUTINE X 2)     Status: None   Collection Time    12/26/13  4:20 AM      Result Value Ref Range Status   Specimen Description BLOOD RIGHT WRIST   Final   Value: GRAM POSITIVE COCCI IN CLUSTERS     Performed at [facility]   Report Status PENDING   Incomplete     Studies: Dg Chest Port 1 View 12/28/2013   IMPRESSION: 1. No significant change from the prior study. 2. Moderate left and small right pleural effusions associated with lung base opacity most likely atelectasis. 3. No overt pulmonary edema.     Scheduled Meds: . aspirin EC  81 mg Oral Daily  . budesonide  0.25 mg Nebulization BID  . calcium acetate  667 mg Oral TID WC  . doxercalciferol  2 mcg Intravenous Q M,W,F-HD  . ferric gluconate    62.5 mg Intravenous Weekly  . guaiFENesin  600 mg Oral BID  . heparin  5,000 Units Subcutaneous 3 times per day  . insulin  aspart  0-15 Units Subcutaneous TID WC  . insulin aspart  0-5 Units Subcutaneous QHS  . insulin aspart  2 Units Subcutaneous TID WC  . insulin glargine  17 Units Subcutaneous QHS  . ipratropium-albuterol  3 mL Nebulization Q6H  . isosorbide mononitrate  120 mg Oral Daily  . levothyroxine  25 mcg Oral QAC breakfast  .  midodrine  10 mg Oral BID WC  . multivitamin  1 tablet Oral QHS  . pantoprazole  40 mg Oral Daily  . simvastatin  10 mg Oral QPM

## 2013-12-30 LAB — RENAL FUNCTION PANEL
Albumin: 3.3 g/dL — ABNORMAL LOW (ref 3.5–5.2)
BUN: 24 mg/dL — ABNORMAL HIGH (ref 6–23)
CO2: 27 mEq/L (ref 19–32)
Calcium: 9 mg/dL (ref 8.4–10.5)
Chloride: 96 mEq/L (ref 96–112)
Creatinine, Ser: 2.25 mg/dL — ABNORMAL HIGH (ref 0.50–1.10)
GFR calc non Af Amer: 21 mL/min — ABNORMAL LOW (ref >90)
GFR, EST AFRICAN AMERICAN: 24 mL/min — AB (ref >90)
Glucose, Bld: 141 mg/dL — ABNORMAL HIGH (ref 70–99)
PHOSPHORUS: 2.1 mg/dL — AB (ref 2.3–4.6)
POTASSIUM: 4.3 meq/L (ref 3.7–5.3)
SODIUM: 135 meq/L — AB (ref 137–147)

## 2013-12-30 LAB — GLUCOSE, CAPILLARY
GLUCOSE-CAPILLARY: 199 mg/dL — AB (ref 70–99)
Glucose-Capillary: 139 mg/dL — ABNORMAL HIGH (ref 70–99)

## 2013-12-30 LAB — CBC
HCT: 33.3 % — ABNORMAL LOW (ref 36.0–46.0)
Hemoglobin: 10.5 g/dL — ABNORMAL LOW (ref 12.0–15.0)
MCH: 30.3 pg (ref 26.0–34.0)
MCHC: 31.5 g/dL (ref 30.0–36.0)
MCV: 96 fL (ref 78.0–100.0)
Platelets: 251 10*3/uL (ref 150–400)
RBC: 3.47 MIL/uL — ABNORMAL LOW (ref 3.87–5.11)
RDW: 16.9 % — AB (ref 11.5–15.5)
WBC: 9 10*3/uL (ref 4.0–10.5)

## 2013-12-30 MED ORDER — RENA-VITE PO TABS: 1.0000 | ORAL_TABLET | Freq: Every day | ORAL | Status: DC

## 2013-12-30 MED ORDER — INSULIN ASPART 100 UNIT/ML ~~LOC~~ SOLN: 2.0000 [IU] | Freq: Three times a day (TID) | SUBCUTANEOUS | Status: DC

## 2013-12-30 MED ORDER — DOXERCALCIFEROL 4 MCG/2ML IV SOLN
INTRAVENOUS | Status: AC
  Administered 2013-12-30: 2 ug via INTRAVENOUS
  Filled 2013-12-30: qty 2

## 2013-12-30 MED ORDER — MIDODRINE HCL 5 MG PO TABS
5.0000 mg | ORAL_TABLET | Freq: Two times a day (BID) | ORAL | Status: DC
  Filled 2013-12-30 (×2): qty 1

## 2013-12-30 MED ORDER — MIDODRINE HCL 5 MG PO TABS: 5.0000 mg | ORAL_TABLET | Freq: Two times a day (BID) | ORAL | Status: DC

## 2013-12-30 MED ORDER — IPRATROPIUM-ALBUTEROL 0.5-2.5 (3) MG/3ML IN SOLN
3.0000 mL | Freq: Two times a day (BID) | RESPIRATORY_TRACT | Status: DC
  Filled 2013-12-30: qty 3

## 2013-12-30 MED ORDER — GUAIFENESIN ER 600 MG PO TB12: 600.0000 mg | ORAL_TABLET | Freq: Two times a day (BID) | ORAL | Status: DC | PRN

## 2013-12-30 MED ORDER — INSULIN GLARGINE 100 UNIT/ML ~~LOC~~ SOLN: 17.0000 [IU] | Freq: Every day | SUBCUTANEOUS | Status: DC

## 2013-12-30 NOTE — Progress Notes (Signed)
SATURATION QUALIFICATIONS: (This note is used to comply with regulatory documentation for home oxygen)  Patient Saturations on Room Air at Rest = 85%  Patient Saturations on 4 Liters of oxygen at rest = 92%  Please briefly explain why patient needs home oxygen:  Patient's oxygen level fell to 85% on room air at rest.  With 4L oxygen via nasal cannula applied, oxygen saturation rose to 92% on room air at rest.

## 2013-12-30 NOTE — Discharge Instructions (Signed)
End-Stage Kidney Disease °The kidneys are two organs that lie on either side of the spine between the middle of the back and the front of the abdomen. The kidneys:  °· Remove wastes and extra water from the blood.   °· Produce important hormones. These help keep bones strong, regulate blood pressure, and help create red blood cells.   °· Balance the fluids and chemicals in the blood and tissues. °End-stage kidney disease occurs when the kidneys are so damaged that they cannot do their job. When the kidneys cannot do their job, life-threatening problems occur. The body cannot stay clean and strong without the help of the kidneys. In end-stage kidney disease, the kidneys cannot get better. You need a new kidney or treatments to do some of the work healthy kidneys do in order to stay alive. °CAUSES  °End-stage kidney disease usually occurs when a long-lasting (chronic) kidney disease gets worse. It may also occur after the kidneys are suddenly damaged (acute kidney injury).  °SYMPTOMS  °· Swelling (edema) of the legs, ankles, or feet.   °· Tiredness (lethargy).   °· Nausea or vomiting.   °· Confusion.   °· Problems with urination, such as:   °· Decreased urine production.   °· Frequent urination, especially at night.   °· Frequent accidents in children who are potty trained.   °· Muscle twitches and cramps.   °· Persistent itchiness.   °· Loss of appetite.   °· Headaches.   °· Abnormally dark or light skin.   °· Numbness in the hands or feet.   °· Easy bruising.   °· Frequent hiccups.   °· Menstruation stops. °DIAGNOSIS  °Your caregiver will measure your blood pressure and take some tests. These may include:  °· Urine tests.   °· Blood tests.   °· Imaging tests, such as:   °· An ultrasound exam.   °· Computed tomography (CT). °· A kidney biopsy. °TREATMENT  °There are two treatments for end-stage kidney disease:  °· A procedure that removes toxic wastes from the body (dialysis).   °· Receiving a new kidney (kidney  transplant). °Both of these treatments have serious risks and consequences. Your caregiver will help you determine which treatment is best for you based on your health, age, and other factors. In addition to having dialysis or a kidney transplant, you may need to take medicines to control high blood pressure (hypertension) and cholesterol and to decrease phosphorus levels in your blood.  °HOME CARE INSTRUCTIONS °· Follow your prescribed diet.   °· Only take over-the-counter or prescription medicines as directed by your caregiver.   °· Do not take any new medicines (prescription, over-the-counter, or nutritional supplements) unless approved by your caregiver. Many medicines can worsen your kidney damage or need to have the dose adjusted.   °· Keep all follow-up appointments. °MAKE SURE YOU: °· Understand these instructions. °· Will watch your condition. °· Will get help right away if you are not doing well or get worse °Document Released: 01/10/2004 Document Revised: 10/06/2012 Document Reviewed: 06/18/2012 °[employer]® Patient Information ©2014 [employer]. ° °

## 2013-12-30 NOTE — Care Management Note (Signed)
    Page 1 of 2   12/30/2013     3:34:02 PM   CARE MANAGEMENT NOTE 12/30/2013  Patient:  [name] [name], [name] [name]   Account Number:  [id number]  Date Initiated:  12/30/2013  Documentation initiated by:  [facility]  Subjective/Objective Assessment:   73 y.o. female with Past medical history of hypothyroidism, coronary artery disease, GERD, peripheral vascular disease, ESRD on hemodialysis, history of DVT, COPD, chronic oxygen use//Hm alone     Action/Plan:   check influenza PCR start her on Tamiflu  Nephrology is consulted for hemodialysis//set up home O2   Anticipated DC Date:  12/30/2013   Anticipated DC Plan:  HOME/SELF CARE      DC Planning Services  CM consult      PAC Choice  DURABLE MEDICAL EQUIPMENT   Choice offered to / List presented to:     DME arranged  [facility]      DME agency  [facility]        Status of service:  Completed, signed off Medicare Important Message given?   (If response is "NO", the following Medicare IM given date fields will be blank) Date Medicare IM given:   Date Additional Medicare IM given:    Discharge Disposition:    Per UR Regulation:    If discussed at Long Length of Stay Meetings, dates discussed:    Comments:  12/30/13 1345 Oletta Cohn, RN, BSN, NCM [phone] Pt currently utilizes [employer] for home oxygen needs.  Robynn Pane of [employer] notified thaqt pt will require more than original set-up.  Pt will  have family member retrieve O2 tank from home for tranport home and additional tanks will be delivered at her home.

## 2013-12-30 NOTE — Progress Notes (Signed)
UR completed Jessenya Berdan K. Cora Stetson, RN, BSN, MSHL, CCM  12/30/2013 12:13 PM

## 2013-12-30 NOTE — Discharge Summary (Signed)
Physician Discharge Summary  [name] [name]:[id number] DOB: 04-06-1941 DOA: 12/26/2013  PCP: Quentin Mulling, MD  Admit date: 12/26/2013 Discharge date: 12/30/2013  Recommendations for Outpatient Follow-up:  1. resume blood pressure medication as before, Imdur and Lopressor. Your blood pressure is 153/60 prior to discharge 2. hemodialysis to be done prior to discharge today 3. please note we increased Lantus to 17 units at bedtime. We also added NovoLog 2 units 3 times a day with meals for better glycemic control 4. please followup with primary care physician in one week after discharge to make sure symptoms are being stable and controlled  Discharge Diagnoses:  Principal Problem:   Acute respiratory failure with hypoxia Active Problems:   Hypothyroidism   History of DVT (deep vein thrombosis)   CAD (coronary artery disease)   Diabetes   ESRD (end stage renal disease)    Discharge Condition: medically stable for discharge home today after HD  Diet recommendation: renal, heart healthy   History of present illness:  73 y.o. female with past medical history of ERSD on HD MWF, recent admission in 11/2013 with pneumonia and CHF exacerbation, diabetes, dyslipidemia, hypothyroidism who presented to [facility] ED 12/26/2013 with severe leg pain with dialysis on the day of this admission. Her leg pain resolved but in [place] ER she was found to be hypoxic with oxygen saturation of 78% on 2L [place] oxygen support. There was no shortness of breath on admission. She was transferred to [facility] for further evaluation and management.   Assessment/Plan:   Principal Problem:  Acute respiratory failure with hypoxia  - perhaps due to volume overload, bilateral pleural effusion but no over pulmonary edema seen on CXR done 2/25  - will continue oxygen support via [place] on discharge; nebulizer treatments per home regimen on discharge  - her strep pneumonia and influenza were negative on admission. Tamiflu was discontinues  as influenza CPR negative  Active Problems:  Gram positive cocci in clusters in blood culture  - staph coag neg species - contaminant ESRD on HD MWF  - HD per renal  - HD today and then discharge home afterwards - appreciate renal team following  - continue phoslo, hectorol and ferric gluconate per renal  Coronary artery disease  - stable  - continue aspirin  - troponin level WNL  Diabetes mellitus with complications of ESRD  - current insulin regimen: Lantus 17 units at bedtime and Novolog 2 units TID  Hypothyroidism  - continue Synthroid 25 mcg daily  Hypertension  - Continue Imdur 120 mg daily  Anemia of chronic disease  - secondary to ESRD  - on ferric gluconate weekly  - hemoglobin 10.7 this am  Left heel pressure ulcer  - Patient is followed by outpatient wound care center.  - wound measurement: 1.8cm x 1cm x 0.5cm  - dressing procedure: twice-daily saline dressing with pressure redistribution to the affected area via use of a Prevalon boot while patient is in bed or in a recliner chair.   Code Status: DNR/DNI  Family Communication: no family at the bedside    Consultants:  Wound care  Nephrology  Procedures:  None  Antibiotics:  None     Signed:  Manson Passey, MD  [facility] 12/30/2013, 11:08 AM  Pager #: [phone]   Discharge Exam: Filed Vitals:   12/30/13 0951  BP: 153/60  Pulse: 103  Temp:   Resp: 18   Filed Vitals:   12/29/13 2029 12/29/13 2130 12/30/13 0510 12/30/13 0951  BP:  139/60  147/76 153/60  Pulse:  103 90 103  Temp:  98 F (36.7 C) 97.8 F (36.6 C)   TempSrc:  Oral Oral   Resp:  16 18 18   Height:      Weight:   65.635 kg (144 lb 11.2 oz)   SpO2: 98% 94% 95% 94%    General: Pt is alert, follows commands appropriately, not in acute distress Cardiovascular: Regular rate and rhythm, S1/S2 appreciated  Respiratory: Clear to auscultation bilaterally, no wheezing, no crackles, no rhonchi Abdominal: Soft, non tender,  non distended, bowel sounds +, no guarding Extremities: no edema, no cyanosis, pulses palpable bilaterally DP and PT Neuro: Grossly nonfocal  Discharge Instructions  Discharge Orders   Future Orders Complete By Expires   Call MD for:  difficulty breathing, headache or visual disturbances  As directed    Call MD for:  persistant dizziness or light-headedness  As directed    Call MD for:  persistant nausea and vomiting  As directed    Call MD for:  severe uncontrolled pain  As directed    Diet - low sodium heart healthy  As directed    Discharge instructions  As directed    Comments:     1. resume blood pressure medication as before, Imdur and Lopressor. Your blood pressure is 153/60 prior to discharge 2. hemodialysis to be done prior to discharge today 3. please note we increased Lantus to 17 units at bedtime. We also added NovoLog 2 units 3 times a day with meals for better glycemic control 4. please followup with primary care physician in one week after discharge to make sure symptoms are being stable and controlled   Increase activity slowly  As directed        Medication List         albuterol (5 MG/ML) 0.5% nebulizer solution  Commonly known as:  PROVENTIL  Take 2.5 mg by nebulization 2 (two) times daily.     aspirin EC 81 MG tablet  Take 81 mg by mouth daily.     budesonide 0.25 MG/2ML nebulizer solution  Commonly known as:  PULMICORT  Take 2 mLs (0.25 mg total) by nebulization 2 (two) times daily.     calcium acetate 667 MG capsule  Commonly known as:  PHOSLO  Take 667 mg by mouth 3 (three) times daily with meals.     guaiFENesin 600 MG 12 hr tablet  Commonly known as:  MUCINEX  Take 1 tablet (600 mg total) by mouth 2 (two) times daily as needed for cough.     insulin aspart 100 UNIT/ML injection  Commonly known as:  novoLOG  Inject 2 Units into the skin 3 (three) times daily with meals.     insulin glargine 100 UNIT/ML injection  Commonly known as:  LANTUS   Inject 0.17 mLs (17 Units total) into the skin at bedtime.     isosorbide mononitrate 120 MG 24 hr tablet  Commonly known as:  IMDUR  Take 120 mg by mouth daily.     levothyroxine 25 MCG tablet  Commonly known as:  SYNTHROID, LEVOTHROID  Take 25 mcg by mouth daily.     metoprolol tartrate 25 MG tablet  Commonly known as:  LOPRESSOR  Take 12.5 mg by mouth daily.     midodrine 5 MG tablet  Commonly known as:  PROAMATINE  Take 1 tablet (5 mg total) by mouth 2 (two) times daily with a meal.     multivitamin Tabs tablet  Take  1 tablet by mouth at bedtime.     nitroGLYCERIN 0.4 MG SL tablet  Commonly known as:  NITROSTAT  Place 0.4 mg under the tongue every 5 (five) minutes as needed. For chest pain.     NOVOLIN R RELION 100 units/mL injection  Generic drug:  insulin regular  Inject 6-10 Units into the skin 2 (two) times daily. If cbg 300 or 400 use 10 units, if cbg 200 use 6 unit, if cbg 160 use 2 units. Sliding scale     pantoprazole 40 MG tablet  Commonly known as:  PROTONIX  Take 40 mg by mouth daily.     PROBIOTIC PO  Take 1 capsule by mouth daily as needed (digestion).     simvastatin 10 MG tablet  Commonly known as:  ZOCOR  Take 10 mg by mouth every evening.     tiotropium 18 MCG inhalation capsule  Commonly known as:  SPIRIVA  Place 1 capsule (18 mcg total) into inhaler and inhale daily.           Follow-up Information   Follow up with HOOPER,[name], MD. Schedule an appointment as soon as possible for a visit in 1 week.   Specialty:  Geriatric Medicine   Contact information:   [address] [place] [address] [phone]        The results of significant diagnostics from this hospitalization (including imaging, microbiology, ancillary and laboratory) are listed below for reference.    Significant Diagnostic Studies: Dg Chest Port 1 View 12/28/2013     IMPRESSION: 1. No significant change from the prior study. 2. Moderate left and small right  pleural effusions associated with lung base opacity most likely atelectasis. 3. No overt pulmonary edema.     Dg Chest Port 1 View 12/26/2013    IMPRESSION: Small to moderate bilateral pleural effusions, with bibasilar airspace opacification. Underlying vascular congestion noted. Findings concerning for pulmonary edema.    Microbiology: CULTURE, BLOOD (ROUTINE X 2)     Status: None   Collection Time    12/26/13  4:15 AM      Result Value Ref Range Status   Specimen Description BLOOD RIGHT ARM   Final   Value:        BLOOD CULTURE RECEIVED NO GROWTH TO DATE      Performed at [facility]   Report Status PENDING   Incomplete  CULTURE, BLOOD (ROUTINE X 2)     Status: None   Collection Time    12/26/13  4:20 AM      Result Value Ref Range Status   Specimen Description BLOOD RIGHT WRIST   Final   Value: STAPHYLOCOCCUS SPECIES (COAGULASE NEGATIVE)   Report Status 12/29/2013 FINAL   Final     Labs: Basic Metabolic Panel:  Recent Labs Lab 12/26/13 0334 12/26/13 1326 12/28/13 1444  NA 139 143 137  K 5.9* 4.2 3.4*  CL 96 98 97  CO2 30 34* 28  GLUCOSE 114* 195* 75  BUN 39* 42* 14  CREATININE 3.40* 3.55* 1.96*  CALCIUM 9.0 8.9 8.9  PHOS  --  4.2 1.5*   Liver Function Tests:  Recent Labs Lab 12/26/13 0334 12/26/13 1326 12/28/13 1444  AST 52*  --   --   ALT 37*  --   --   ALKPHOS 125*  --   --   BILITOT <0.2*  --   --   PROT 7.1  --   --   ALBUMIN 3.5 3.1* 3.5  No results found for this basename: LIPASE, AMYLASE,  in the last 168 hours No results found for this basename: AMMONIA,  in the last 168 hours CBC:  Recent Labs Lab 12/26/13 0334 12/26/13 1326 12/28/13 1444  WBC 10.6* 9.7 9.1  NEUTROABS 8.6*  --   --   HGB 12.3 10.7* 10.7*  HCT 40.3 35.1* 35.0*  MCV 98.1 97.2 96.4  PLT 353 305 248   Cardiac Enzymes:  Recent Labs Lab 12/27/13 0811 12/27/13 1411 12/27/13 2023  TROPONINI <0.30 <0.30 <0.30   BNP: BNP (last 3 results)  Recent Labs   10/26/13 0510 11/07/13 0455  PROBNP 9552.0* 22434.0*   CBG:  Recent Labs Lab 12/29/13 0600 12/29/13 1112 12/29/13 1722 12/29/13 2127 12/30/13 0617  GLUCAP 170* 137* 137* 179* 139*    Time coordinating discharge: Over 30 minutes

## 2013-12-30 NOTE — Progress Notes (Signed)
[facility] Progress Note   Subjective: Goal not met yest w HD due to cramping. Lowest BP 104/80 . Feeling much better, breathing back to baseline  Filed Vitals:   12/29/13 2029 12/29/13 2130 12/30/13 0510 12/30/13 0951  BP:  139/60 147/76 153/60  Pulse:  103 90 103  Temp:  98 F (36.7 C) 97.8 F (36.6 C)   TempSrc:  Oral Oral   Resp:  _0 Height:      Weight:   65.635 kg (144 lb 11.2 oz)   SpO2: 98% 94% 95% 94%   Exam Up in bed, not in distress No jvd Lungs are clear today RRR no MRG Abd soft, nt, nd No LE edema L IJ cath  ECHO - LV 30%, RV ok CXR- 2/23, 2/25 > bilat effusions, no overt edema Wt admit 70kg, lowest 65.5kg  Dialysis: MWF [place] 4h  66.5kg  2K/2.5 [place]  L IJ Cath 400/1.5  Prof 4 Hectorol 2     Epo 4600     Venofer 50/wk   Assessment: 1 Dyspnea / vol excess / pleural effusions- improved clinically 2 ESRD on hemodialysis 3 HTN/vol- low EF 30%, stopped metoprolol, started midodrine at 5 bid now 4 Anemia- Hb up, holding esa 5 2HPT- cont binders/D 6 COPD on home O2 7 Hx CAD / MI / sCHF EF 30% 8 Hx DVT/PE 9 DNR- pt request   Plan- OK for d/c after HD today   Kelly Splinter MD  pager [phone]    cell [phone]  12/30/2013, 11:16 AM     Recent Labs Lab 12/26/13 0334 12/26/13 1326 12/28/13 1444  NA 139 143 137  K 5.9* 4.2 3.4*  CL 96 98 97  CO2 30 34* 28  GLUCOSE 114* 195* 75  BUN 39* 42* 14  CREATININE 3.40* 3.55* 1.96*  CALCIUM 9.0 8.9 8.9  PHOS  --  4.2 1.5*    Recent Labs Lab 12/26/13 0334 12/26/13 1326 12/28/13 1444  AST 52*  --   --   ALT 37*  --   --   ALKPHOS 125*  --   --   BILITOT <0.2*  --   --   PROT 7.1  --   --   ALBUMIN 3.5 3.1* 3.5    Recent Labs Lab 12/26/13 0334 12/26/13 1326 12/28/13 1444  WBC 10.6* 9.7 9.1  NEUTROABS 8.6*  --   --   HGB 12.3 10.7* 10.7*  HCT 40.3 35.1* 35.0*  MCV 98.1 97.2 96.4  PLT 353 305 248   . aspirin EC  81 mg Oral Daily  . budesonide  0.25 mg  Nebulization BID  . calcium acetate  667 mg Oral TID WC  . doxercalciferol  2 mcg Intravenous Q M,W,F-HD  . ferric gluconate (FERRLECIT/NULECIT) IV  62.5 mg Intravenous Weekly  . guaiFENesin  600 mg Oral BID  . heparin  5,000 Units Subcutaneous 3 times per day  . insulin aspart  0-15 Units Subcutaneous TID WC  . insulin aspart  0-5 Units Subcutaneous QHS  . insulin aspart  2 Units Subcutaneous TID WC  . insulin glargine  17 Units Subcutaneous QHS  . ipratropium-albuterol  3 mL Nebulization BID  . isosorbide mononitrate  120 mg Oral Daily  . levothyroxine  25 mcg Oral QAC breakfast  . midodrine  5 mg Oral BID WC  . multivitamin  1 tablet Oral QHS  . pantoprazole  40 mg Oral Daily  . simvastatin  10 mg Oral  QPM  . sodium chloride  3 mL Intravenous Q12H     acetaminophen, acetaminophen, nitroGLYCERIN, ondansetron (ZOFRAN) IV, ondansetron

## 2013-12-30 NOTE — Progress Notes (Signed)
The patient was discharged at 2030.  She was taken off of telemetry and her IV was removed.  Discharge instructions and education were completed.  The patient was handed her paper prescriptions.  Her son brought her oxygen tank from home to the hospital.  She was taken out via wheelchair by the charge nurse and taken home by her son.

## 2014-01-01 DIAGNOSIS — K922 Gastrointestinal hemorrhage, unspecified: Secondary | ICD-10-CM

## 2014-01-01 HISTORY — DX: Gastrointestinal hemorrhage, unspecified: K92.2

## 2014-01-01 LAB — CULTURE, BLOOD (ROUTINE X 2): Culture: NO GROWTH

## 2014-01-02 LAB — GLUCOSE, CAPILLARY: Glucose-Capillary: 133 mg/dL — ABNORMAL HIGH (ref 70–99)

## 2014-01-04 ENCOUNTER — Inpatient Hospital Stay (HOSPITAL_COMMUNITY): Payer: Medicare Other

## 2014-01-04 ENCOUNTER — Inpatient Hospital Stay (HOSPITAL_COMMUNITY)
Admission: AD | Admit: 2014-01-04 | Discharge: 2014-01-08 | DRG: 377 | Disposition: A | Discharge status: Home / Self Care | Payer: Medicare Other | Source: Other Acute Inpatient Hospital | Attending: Internal Medicine | Admitting: Internal Medicine

## 2014-01-04 ENCOUNTER — Encounter (HOSPITAL_COMMUNITY): Payer: Self-pay | Admitting: Internal Medicine

## 2014-01-04 DIAGNOSIS — E039 Hypothyroidism, unspecified: Secondary | ICD-10-CM | POA: Diagnosis present

## 2014-01-04 DIAGNOSIS — Z992 Dependence on renal dialysis: Secondary | ICD-10-CM

## 2014-01-04 DIAGNOSIS — J4489 Other specified chronic obstructive pulmonary disease: Secondary | ICD-10-CM | POA: Diagnosis present

## 2014-01-04 DIAGNOSIS — N2581 Secondary hyperparathyroidism of renal origin: Secondary | ICD-10-CM | POA: Diagnosis present

## 2014-01-04 DIAGNOSIS — K31811 Angiodysplasia of stomach and duodenum with bleeding: Principal | ICD-10-CM | POA: Diagnosis present

## 2014-01-04 DIAGNOSIS — R9431 Abnormal electrocardiogram [ECG] [EKG]: Secondary | ICD-10-CM

## 2014-01-04 DIAGNOSIS — K922 Gastrointestinal hemorrhage, unspecified: Secondary | ICD-10-CM | POA: Diagnosis present

## 2014-01-04 DIAGNOSIS — K31819 Angiodysplasia of stomach and duodenum without bleeding: Secondary | ICD-10-CM

## 2014-01-04 DIAGNOSIS — J449 Chronic obstructive pulmonary disease, unspecified: Secondary | ICD-10-CM | POA: Diagnosis present

## 2014-01-04 DIAGNOSIS — Z86711 Personal history of pulmonary embolism: Secondary | ICD-10-CM

## 2014-01-04 DIAGNOSIS — I2510 Atherosclerotic heart disease of native coronary artery without angina pectoris: Secondary | ICD-10-CM | POA: Diagnosis present

## 2014-01-04 DIAGNOSIS — E7800 Pure hypercholesterolemia, unspecified: Secondary | ICD-10-CM

## 2014-01-04 DIAGNOSIS — R7402 Elevation of levels of lactic acid dehydrogenase (LDH): Secondary | ICD-10-CM

## 2014-01-04 DIAGNOSIS — N1330 Unspecified hydronephrosis: Secondary | ICD-10-CM | POA: Diagnosis present

## 2014-01-04 DIAGNOSIS — E1129 Type 2 diabetes mellitus with other diabetic kidney complication: Secondary | ICD-10-CM | POA: Diagnosis present

## 2014-01-04 DIAGNOSIS — Z9981 Dependence on supplemental oxygen: Secondary | ICD-10-CM

## 2014-01-04 DIAGNOSIS — I10 Essential (primary) hypertension: Secondary | ICD-10-CM | POA: Diagnosis present

## 2014-01-04 DIAGNOSIS — D62 Acute posthemorrhagic anemia: Secondary | ICD-10-CM | POA: Diagnosis present

## 2014-01-04 DIAGNOSIS — E119 Type 2 diabetes mellitus without complications: Secondary | ICD-10-CM | POA: Diagnosis present

## 2014-01-04 DIAGNOSIS — D631 Anemia in chronic kidney disease: Secondary | ICD-10-CM | POA: Diagnosis present

## 2014-01-04 DIAGNOSIS — I214 Non-ST elevation (NSTEMI) myocardial infarction: Secondary | ICD-10-CM

## 2014-01-04 DIAGNOSIS — I2489 Other forms of acute ischemic heart disease: Secondary | ICD-10-CM | POA: Diagnosis present

## 2014-01-04 DIAGNOSIS — R740 Nonspecific elevation of levels of transaminase and lactic acid dehydrogenase [LDH]: Secondary | ICD-10-CM

## 2014-01-04 DIAGNOSIS — J9620 Acute and chronic respiratory failure, unspecified whether with hypoxia or hypercapnia: Secondary | ICD-10-CM | POA: Diagnosis present

## 2014-01-04 DIAGNOSIS — R748 Abnormal levels of other serum enzymes: Secondary | ICD-10-CM | POA: Diagnosis present

## 2014-01-04 DIAGNOSIS — I739 Peripheral vascular disease, unspecified: Secondary | ICD-10-CM | POA: Diagnosis present

## 2014-01-04 DIAGNOSIS — K219 Gastro-esophageal reflux disease without esophagitis: Secondary | ICD-10-CM | POA: Diagnosis present

## 2014-01-04 DIAGNOSIS — I248 Other forms of acute ischemic heart disease: Secondary | ICD-10-CM | POA: Diagnosis present

## 2014-01-04 DIAGNOSIS — K7200 Acute and subacute hepatic failure without coma: Secondary | ICD-10-CM | POA: Diagnosis present

## 2014-01-04 DIAGNOSIS — I5042 Chronic combined systolic (congestive) and diastolic (congestive) heart failure: Secondary | ICD-10-CM

## 2014-01-04 DIAGNOSIS — N186 End stage renal disease: Secondary | ICD-10-CM

## 2014-01-04 DIAGNOSIS — Z885 Allergy status to narcotic agent status: Secondary | ICD-10-CM

## 2014-01-04 DIAGNOSIS — Z794 Long term (current) use of insulin: Secondary | ICD-10-CM

## 2014-01-04 DIAGNOSIS — R778 Other specified abnormalities of plasma proteins: Secondary | ICD-10-CM | POA: Diagnosis present

## 2014-01-04 DIAGNOSIS — I120 Hypertensive chronic kidney disease with stage 5 chronic kidney disease or end stage renal disease: Secondary | ICD-10-CM | POA: Diagnosis present

## 2014-01-04 DIAGNOSIS — Z79899 Other long term (current) drug therapy: Secondary | ICD-10-CM

## 2014-01-04 DIAGNOSIS — R7989 Other specified abnormal findings of blood chemistry: Secondary | ICD-10-CM

## 2014-01-04 DIAGNOSIS — I2582 Chronic total occlusion of coronary artery: Secondary | ICD-10-CM | POA: Diagnosis present

## 2014-01-04 DIAGNOSIS — Z86718 Personal history of other venous thrombosis and embolism: Secondary | ICD-10-CM

## 2014-01-04 DIAGNOSIS — J9819 Other pulmonary collapse: Secondary | ICD-10-CM | POA: Diagnosis not present

## 2014-01-04 DIAGNOSIS — Z87891 Personal history of nicotine dependence: Secondary | ICD-10-CM

## 2014-01-04 DIAGNOSIS — I5040 Unspecified combined systolic (congestive) and diastolic (congestive) heart failure: Secondary | ICD-10-CM | POA: Diagnosis present

## 2014-01-04 DIAGNOSIS — N039 Chronic nephritic syndrome with unspecified morphologic changes: Secondary | ICD-10-CM

## 2014-01-04 DIAGNOSIS — I509 Heart failure, unspecified: Secondary | ICD-10-CM | POA: Diagnosis present

## 2014-01-04 DIAGNOSIS — K921 Melena: Secondary | ICD-10-CM | POA: Diagnosis present

## 2014-01-04 DIAGNOSIS — Z7982 Long term (current) use of aspirin: Secondary | ICD-10-CM

## 2014-01-04 DIAGNOSIS — I959 Hypotension, unspecified: Secondary | ICD-10-CM | POA: Diagnosis present

## 2014-01-04 DIAGNOSIS — Z833 Family history of diabetes mellitus: Secondary | ICD-10-CM

## 2014-01-04 LAB — COMPREHENSIVE METABOLIC PANEL
ALBUMIN: 3.1 g/dL — AB (ref 3.5–5.2)
ALK PHOS: 82 U/L (ref 39–117)
ALT: 427 U/L — ABNORMAL HIGH (ref 0–35)
AST: 519 U/L — ABNORMAL HIGH (ref 0–37)
BUN: 36 mg/dL — AB (ref 6–23)
CALCIUM: 7.9 mg/dL — AB (ref 8.4–10.5)
CO2: 23 mEq/L (ref 19–32)
Chloride: 98 mEq/L (ref 96–112)
Creatinine, Ser: 1.82 mg/dL — ABNORMAL HIGH (ref 0.50–1.10)
GFR calc non Af Amer: 27 mL/min — ABNORMAL LOW (ref >90)
GFR, EST AFRICAN AMERICAN: 31 mL/min — AB (ref >90)
GLUCOSE: 211 mg/dL — AB (ref 70–99)
Potassium: 4.8 mEq/L (ref 3.7–5.3)
Sodium: 140 mEq/L (ref 137–147)
TOTAL PROTEIN: 5.6 g/dL — AB (ref 6.0–8.3)
Total Bilirubin: 0.6 mg/dL (ref 0.3–1.2)

## 2014-01-04 LAB — CBC
HCT: 22.6 % — ABNORMAL LOW (ref 36.0–46.0)
Hemoglobin: 7.4 g/dL — ABNORMAL LOW (ref 12.0–15.0)
MCH: 30 pg (ref 26.0–34.0)
MCHC: 32.7 g/dL (ref 30.0–36.0)
MCV: 91.5 fL (ref 78.0–100.0)
Platelets: 189 10*3/uL (ref 150–400)
RBC: 2.47 MIL/uL — ABNORMAL LOW (ref 3.87–5.11)
RDW: 16.8 % — AB (ref 11.5–15.5)
WBC: 17.1 10*3/uL — ABNORMAL HIGH (ref 4.0–10.5)

## 2014-01-04 LAB — GLUCOSE, CAPILLARY: Glucose-Capillary: 211 mg/dL — ABNORMAL HIGH (ref 70–99)

## 2014-01-04 LAB — MRSA PCR SCREENING: MRSA BY PCR: NEGATIVE

## 2014-01-04 LAB — PROTIME-INR
INR: 1.38 (ref 0.00–1.49)
Prothrombin Time: 16.6 seconds — ABNORMAL HIGH (ref 11.6–15.2)

## 2014-01-04 LAB — TROPONIN I: Troponin I: 2.89 ng/mL (ref <0.30)

## 2014-01-04 LAB — LACTIC ACID, PLASMA: Lactic Acid, Venous: 3.2 mmol/L — ABNORMAL HIGH (ref 0.5–2.2)

## 2014-01-04 MED ORDER — ALBUTEROL SULFATE (2.5 MG/3ML) 0.083% IN NEBU
2.5000 mg | INHALATION_SOLUTION | Freq: Two times a day (BID) | RESPIRATORY_TRACT | Status: DC
  Administered 2014-01-04 – 2014-01-08 (×7): 2.5 mg via RESPIRATORY_TRACT
  Filled 2014-01-04 (×7): qty 3

## 2014-01-04 MED ORDER — SODIUM CHLORIDE 0.9 % IV SOLN
80.0000 mg | Freq: Once | INTRAVENOUS | Status: AC
  Administered 2014-01-04: 80 mg via INTRAVENOUS
  Filled 2014-01-04: qty 80

## 2014-01-04 MED ORDER — SODIUM CHLORIDE 0.9 % IJ SOLN
3.0000 mL | Freq: Two times a day (BID) | INTRAMUSCULAR | Status: DC
  Administered 2014-01-04 – 2014-01-07 (×6): 3 mL via INTRAVENOUS

## 2014-01-04 MED ORDER — TIOTROPIUM BROMIDE MONOHYDRATE 18 MCG IN CAPS
18.0000 ug | ORAL_CAPSULE | Freq: Every day | RESPIRATORY_TRACT | Status: DC
  Administered 2014-01-05 – 2014-01-08 (×3): 18 ug via RESPIRATORY_TRACT
  Filled 2014-01-04: qty 5

## 2014-01-04 MED ORDER — ONDANSETRON HCL 4 MG PO TABS: 4.0000 mg | ORAL_TABLET | Freq: Four times a day (QID) | ORAL | Status: DC | PRN

## 2014-01-04 MED ORDER — ACETAMINOPHEN 325 MG PO TABS: 650.0000 mg | ORAL_TABLET | Freq: Four times a day (QID) | ORAL | Status: DC | PRN

## 2014-01-04 MED ORDER — SODIUM CHLORIDE 0.9 % IJ SOLN
3.0000 mL | Freq: Two times a day (BID) | INTRAMUSCULAR | Status: DC
  Administered 2014-01-07 (×2): 3 mL via INTRAVENOUS

## 2014-01-04 MED ORDER — PANTOPRAZOLE SODIUM 40 MG IV SOLR
8.0000 mg/h | INTRAVENOUS | Status: DC
Start: 2014-01-04 — End: 2014-01-06
  Administered 2014-01-04 – 2014-01-06 (×3): 8 mg/h via INTRAVENOUS
  Filled 2014-01-04 (×8): qty 80

## 2014-01-04 MED ORDER — PANTOPRAZOLE SODIUM 40 MG IV SOLR
40.0000 mg | Freq: Two times a day (BID) | INTRAVENOUS | Status: DC
Start: 2014-01-08 — End: 2014-01-05

## 2014-01-04 MED ORDER — LEVOTHYROXINE SODIUM 100 MCG IV SOLR
12.5000 ug | Freq: Every day | INTRAVENOUS | Status: DC
  Administered 2014-01-05: 12.5 ug via INTRAVENOUS
  Filled 2014-01-04 (×2): qty 5

## 2014-01-04 MED ORDER — ACETAMINOPHEN 650 MG RE SUPP: 650.0000 mg | Freq: Four times a day (QID) | RECTAL | Status: DC | PRN

## 2014-01-04 MED ORDER — BUDESONIDE 0.25 MG/2ML IN SUSP
0.2500 mg | Freq: Two times a day (BID) | RESPIRATORY_TRACT | Status: DC
  Administered 2014-01-04 – 2014-01-08 (×7): 0.25 mg via RESPIRATORY_TRACT
  Filled 2014-01-04 (×9): qty 2

## 2014-01-04 MED ORDER — NITROGLYCERIN 0.4 MG SL SUBL: 0.4000 mg | SUBLINGUAL_TABLET | SUBLINGUAL | Status: DC | PRN

## 2014-01-04 MED ORDER — INSULIN ASPART 100 UNIT/ML ~~LOC~~ SOLN
0.0000 [IU] | Freq: Three times a day (TID) | SUBCUTANEOUS | Status: DC
  Administered 2014-01-05: 1 [IU] via SUBCUTANEOUS
  Administered 2014-01-05: 3 [IU] via SUBCUTANEOUS
  Administered 2014-01-05: 2 [IU] via SUBCUTANEOUS

## 2014-01-04 MED ORDER — ONDANSETRON HCL 4 MG/2ML IJ SOLN: 4.0000 mg | Freq: Four times a day (QID) | INTRAMUSCULAR | Status: DC | PRN

## 2014-01-04 MED ORDER — ALBUTEROL SULFATE (5 MG/ML) 0.5% IN NEBU: 2.5000 mg | INHALATION_SOLUTION | Freq: Two times a day (BID) | RESPIRATORY_TRACT | Status: DC

## 2014-01-04 NOTE — Progress Notes (Signed)
CRITICAL VALUE ALERT  Critical value received:  Troponin  Date of notification:  01/04/2014  Time of notification:  01115  Critical value read back:yes  Nurse who received alert:  Elson Clan, RN  MD notified (1st page):  Toniann Fail, MD  Time of first page:  1120  MD notified (2nd page):  Time of second page:  Responding MD:  Toniann Fail, MD  Time MD responded:  1125

## 2014-01-04 NOTE — Progress Notes (Addendum)
Spoke with Dr. Particia Nearing at [facility] ED.  Requested transfer of patient.  Pt presented with melanotic stool since d/c from hospital on 12/30/13 but did not tell anyone until she felt weak today at dialysis.  Hgb noted to be 6.1 in the ED.  Hgb noted to be 10.5 on 12/30/13.   Rectal exam showed melanotic stool without bright red blood.  BP was mildly low in 80s, improved to 120/46 after 500cc fluid bolus in the ED with HR 90.  Request transfer for HD and GI evaluation.   1730--called to get update on patient.  Pt BP dropped to 83/52.  Patient received one unit of PRBC with BP back to 94/54.  Pt without any respiratory distress.  Patient status changed to stepdown bed.  DTat

## 2014-01-04 NOTE — H&P (Addendum)
[facility] History and Physical  [name] SnipeJudy A Ramo WUJ:[id number]:[id number] DOB: 08/09/1941 DOA: 01/04/2014  Referring physician: ER physician. [facility]. PCP: [name],[name], MD  Specialists: Nephrologist for dialysis.  Chief Complaint: Weak.  HPI: [name] [name] is a 73 y.o. female with known history of ESRD on hemodialysis Monday Wednesday and Friday, CAD, history of DVT and PE, diabetes mellitus was complaining of weakness during dialysis today and hemoglobin was found to be around 6 and stool for occult blood was positive with melanotic stool and patient was [facility]. [facility] patient also was found to be hypotensive. Patient was given normal saline bolus and transfused one unit of packed blood cells and [facility]. Patient takes only aspirin. Denies taking any NSAIDs. Denies any chest pain shortness of breath abdominal pain diarrhea. After reaching [facility] patient had one episode of nausea vomiting which was bloody. Patient has been admitted for further management. Patient was recent admitted for acute respiratory failure secondary to fluid overload.  Review of Systems: As presented in the history of presenting illness, rest negative.  Past Medical History  Diagnosis Date  . Hypercholesterolemia   . Hypothyroidism   . Colitis   . Coronary artery disease   . Hypertension   . GERD (gastroesophageal reflux disease)   . Myocardial infarction   . Peripheral vascular disease   . Angina   . H/O Clostridium difficile infection   . Hx of pulmonary embolus   . COPD (chronic obstructive pulmonary disease)   . Shortness of breath     uses O2 as needed  . Critical lower limb ischemia   . Claudication   . DVT (deep venous thrombosis) 2008    "right"  . Pneumonia     "several times"  . On home oxygen therapy     "3-4L; just at night" (12/07/2013)  . Type II diabetes mellitus   .  ESRD (end stage renal disease) on dialysis     M-W-F   [place] (12/07/2013)  . CHF (congestive heart failure)    Past Surgical History  Procedure Laterality Date  . Tubal ligation    . Toe amputation Right     5th toe  . Tonsillectomy    . Insertion of dialysis catheter Left     chest  . Av fistula placement Right [id number]    RUE  . Bascilic vein transposition  May 18, 2012    First stage  . Bascilic vein transposition Left 07/27/12    Basilic Vein Transposition  . Arch aortogram  08/19/2012    Procedure: ARCH AORTOGRAM;  Surgeon: Fransisco HertzBrian L Chen, MD;  [facility];  Service: Vascular;  Laterality: N/A;  . Insertion of dialysis catheter  08/21/2012    Procedure: INSERTION OF DIALYSIS CATHETER;  Surgeon: Sherren Kernsharles E Fields, MD;  [facility];  Service: Vascular;  Laterality: Left;  internal jugular   Social History:  reports that she quit smoking about 2 years ago. Her smoking use included Cigarettes. She has a 40 pack-year smoking history. She has never used smokeless tobacco. She reports that she does not drink alcohol or use illicit drugs. Where does patient live home. Can patient participate in ADLs? Yes.  Allergies  Allergen Reactions  . Procaine Hcl Nausea And Vomiting and Other (See Comments)    "Pass out"    Family History:  Family History  Problem Relation Age of Onset  . Diabetes type II    .  Hypertension    . Diabetes Mother   . Kidney disease Mother   . Diabetes Father   . Anesthesia problems Neg Hx       Prior to Admission medications   Medication Sig Start Date End Date Taking? Authorizing Provider  albuterol (PROVENTIL) (5 MG/ML) 0.5% nebulizer solution Take 2.5 mg by nebulization 2 (two) times daily. 10/31/13   Jerald Kief, MD  aspirin EC 81 MG tablet Take 81 mg by mouth daily.    Historical Provider, MD  budesonide (PULMICORT) 0.25 MG/2ML nebulizer solution Take 2 mLs (0.25 mg total) by nebulization 2 (two) times daily. 10/31/13   Jerald Kief, MD   calcium acetate (PHOSLO) 667 MG capsule Take 667 mg by mouth 3 (three) times daily with meals.    Historical Provider, MD  guaiFENesin (MUCINEX) 600 MG 12 hr tablet Take 1 tablet (600 mg total) by mouth 2 (two) times daily as needed for cough. 12/30/13   Alison Murray, MD  insulin aspart (NOVOLOG) 100 UNIT/ML injection Inject 2 Units into the skin 3 (three) times daily with meals. 12/30/13   Alison Murray, MD  insulin glargine (LANTUS) 100 UNIT/ML injection Inject 0.17 mLs (17 Units total) into the skin at bedtime. 12/30/13   Alison Murray, MD  isosorbide mononitrate (IMDUR) 120 MG 24 hr tablet Take 120 mg by mouth daily.     Historical Provider, MD  levothyroxine (SYNTHROID, LEVOTHROID) 25 MCG tablet Take 25 mcg by mouth daily.     Historical Provider, MD  metoprolol tartrate (LOPRESSOR) 25 MG tablet Take 12.5 mg by mouth daily.    Historical Provider, MD  midodrine (PROAMATINE) 5 MG tablet Take 1 tablet (5 mg total) by mouth 2 (two) times daily with a meal. 12/30/13   Alison Murray, MD  multivitamin (RENA-VIT) TABS tablet Take 1 tablet by mouth at bedtime. 12/30/13   Alison Murray, MD  nitroGLYCERIN (NITROSTAT) 0.4 MG SL tablet Place 0.4 mg under the tongue every 5 (five) minutes as needed. For chest pain.    Historical Provider, MD  NOVOLIN R RELION 100 UNIT/ML injection Inject 6-10 Units into the skin 2 (two) times daily. If cbg 300 or 400 use 10 units, if cbg 200 use 6 unit, if cbg 160 use 2 units. Sliding scale 10/15/13   Historical Provider, MD  pantoprazole (PROTONIX) 40 MG tablet Take 40 mg by mouth daily.     Historical Provider, MD  Probiotic Product (PROBIOTIC PO) Take 1 capsule by mouth daily as needed (digestion).     Historical Provider, MD  simvastatin (ZOCOR) 10 MG tablet Take 10 mg by mouth every evening.    Historical Provider, MD  tiotropium (SPIRIVA) 18 MCG inhalation capsule Place 1 capsule (18 mcg total) into inhaler and inhale daily. 11/13/13   Rhetta Mura, MD    Physical  Exam: Filed Vitals:   01/04/14 1845 01/04/14 2029  BP: 94/64   Temp:  98.3 F (36.8 C)  TempSrc:  Oral  Resp: 23   Height: 5\' 5"  (1.651 m)   Weight: 66.4 kg (146 lb 6.2 oz)      General:  Well-developed well-nourished.  Eyes: Anicteric no pallor.  ENT: No discharge from ears eyes nose mouth.  Neck: No mass felt.  Cardiovascular: S1-S2 heard.  Respiratory: No rhonchi or crepitations.  Abdomen: Soft nontender bowel sounds present. No guarding rigidity.  Skin: No rash. Left heel ulcer.  Musculoskeletal: Left heel ulcer.  Psychiatric: Appears normal.  Neurologic: Alert  and oriented to time place and person. Moves all extremities.  Labs on Admission:  Basic Metabolic Panel:  Recent Labs Lab 12/30/13 1503  NA 135*  K 4.3  CL 96  CO2 27  GLUCOSE 141*  BUN 24*  CREATININE 2.25*  CALCIUM 9.0  PHOS 2.1*   Liver Function Tests:  Recent Labs Lab 12/30/13 1503  ALBUMIN 3.3*   No results found for this basename: LIPASE, AMYLASE,  in the last 168 hours No results found for this basename: AMMONIA,  in the last 168 hours CBC:  Recent Labs Lab 12/30/13 1503  WBC 9.0  HGB 10.5*  HCT 33.3*  MCV 96.0  PLT 251   Cardiac Enzymes: No results found for this basename: CKTOTAL, CKMB, CKMBINDEX, TROPONINI,  in the last 168 hours  BNP (last 3 results)  Recent Labs  10/26/13 0510 11/07/13 0455  PROBNP 9552.0* 22434.0*   CBG:  Recent Labs Lab 12/29/13 1722 12/29/13 2127 12/30/13 0617 12/30/13 1058 12/30/13 1930  GLUCAP 137* 179* 139* 199* 133*    Radiological Exams on Admission: No results found.   Assessment/Plan Principal Problem:   GI bleed Active Problems:   ESRD (end stage renal disease) on dialysis   Acute blood loss anemia   Hypotension   1. Acute GI bleed - most likely given the melanotic nature of stools patient probably has upper GI bleed. I have placed patient n.p.o. Protonix infusion. Repeat CBC stat. Closely observe in step  down. I have consulted Dr. Arlyce Dice on-call gastroenterologist. 2. Acute blood loss anemia - closely follow CBC. Transfuse if there is any further drop in hemoglobin or hypotension. 3. Hypotension - probably from blood loss. 4. Diabetes mellitus2 - since patient is n.p.o. I have placed patient on sliding-scale coverage. 5. Hypothyroidism - IV Synthroid for now until patient can take orally. 6. Hypertension - hold antihypertensives. 7. CAD - denies any chest pain.  I have reviewed patient's old charts and labs. I have discussed with on-call gastroenterologist Dr. Arlyce Dice.  Addendum - patient's troponin has come positive. EKG was ordered and had discussed with on-call cardiologist who at this time feels patient's elevated troponin is from demand ischemia. Cardiologist feels EKG shows no new changes compared to old.  Code Status: Full code.  Family Communication: None.  Disposition Plan: Admit to inpatient.    Rocky Rishel N. [facility] Pager [phone].  If 7PM-7AM, please contact night-coverage [email or web address] Password [email or web address] 01/04/2014, 8:30 PM

## 2014-01-04 NOTE — Progress Notes (Signed)
Toniann Fail, MD made aware of pt low BP, SBP 80-90's. Will attempt to titrate down pt's oxygen. No new orders at this time. Will continue to monitor.

## 2014-01-04 NOTE — Progress Notes (Signed)
Pt noted to have a positive troponin. MD notified. 12 Lead EKG performed. Pt denies any CP at this time. Will continue to monitor.

## 2014-01-05 ENCOUNTER — Encounter (HOSPITAL_COMMUNITY): Payer: Self-pay | Admitting: Physician Assistant

## 2014-01-05 DIAGNOSIS — K921 Melena: Secondary | ICD-10-CM | POA: Diagnosis present

## 2014-01-05 DIAGNOSIS — R7401 Elevation of levels of liver transaminase levels: Secondary | ICD-10-CM

## 2014-01-05 DIAGNOSIS — R740 Nonspecific elevation of levels of transaminase and lactic acid dehydrogenase [LDH]: Secondary | ICD-10-CM

## 2014-01-05 DIAGNOSIS — R778 Other specified abnormalities of plasma proteins: Secondary | ICD-10-CM | POA: Diagnosis present

## 2014-01-05 DIAGNOSIS — R9431 Abnormal electrocardiogram [ECG] [EKG]: Secondary | ICD-10-CM | POA: Diagnosis present

## 2014-01-05 DIAGNOSIS — R7989 Other specified abnormal findings of blood chemistry: Secondary | ICD-10-CM

## 2014-01-05 LAB — CBC
HCT: 26.4 % — ABNORMAL LOW (ref 36.0–46.0)
HEMATOCRIT: 26.6 % — AB (ref 36.0–46.0)
HEMOGLOBIN: 8.7 g/dL — AB (ref 12.0–15.0)
HEMOGLOBIN: 8.9 g/dL — AB (ref 12.0–15.0)
MCH: 29.6 pg (ref 26.0–34.0)
MCH: 30.1 pg (ref 26.0–34.0)
MCHC: 33 g/dL (ref 30.0–36.0)
MCHC: 33.5 g/dL (ref 30.0–36.0)
MCV: 89.8 fL (ref 78.0–100.0)
MCV: 89.9 fL (ref 78.0–100.0)
Platelets: 219 10*3/uL (ref 150–400)
Platelets: 236 10*3/uL (ref 150–400)
RBC: 2.94 MIL/uL — AB (ref 3.87–5.11)
RBC: 2.96 MIL/uL — ABNORMAL LOW (ref 3.87–5.11)
RDW: 16.4 % — ABNORMAL HIGH (ref 11.5–15.5)
RDW: 17.1 % — ABNORMAL HIGH (ref 11.5–15.5)
WBC: 15.8 10*3/uL — ABNORMAL HIGH (ref 4.0–10.5)
WBC: 15.9 10*3/uL — ABNORMAL HIGH (ref 4.0–10.5)

## 2014-01-05 LAB — BASIC METABOLIC PANEL
BUN: 52 mg/dL — ABNORMAL HIGH (ref 6–23)
CO2: 23 meq/L (ref 19–32)
CREATININE: 2.8 mg/dL — AB (ref 0.50–1.10)
Calcium: 7.6 mg/dL — ABNORMAL LOW (ref 8.4–10.5)
Chloride: 99 mEq/L (ref 96–112)
GFR calc Af Amer: 19 mL/min — ABNORMAL LOW (ref >90)
GFR calc non Af Amer: 16 mL/min — ABNORMAL LOW (ref >90)
GLUCOSE: 250 mg/dL — AB (ref 70–99)
Potassium: 4.7 mEq/L (ref 3.7–5.3)
SODIUM: 141 meq/L (ref 137–147)

## 2014-01-05 LAB — TROPONIN I
TROPONIN I: 4.01 ng/mL — AB (ref <0.30)
Troponin I: 7.41 ng/mL (ref <0.30)
Troponin I: 5.52 ng/mL (ref <0.30)

## 2014-01-05 LAB — GLUCOSE, CAPILLARY
GLUCOSE-CAPILLARY: 159 mg/dL — AB (ref 70–99)
GLUCOSE-CAPILLARY: 222 mg/dL — AB (ref 70–99)
Glucose-Capillary: 240 mg/dL — ABNORMAL HIGH (ref 70–99)
Glucose-Capillary: 234 mg/dL — ABNORMAL HIGH (ref 70–99)
Glucose-Capillary: 135 mg/dL — ABNORMAL HIGH (ref 70–99)

## 2014-01-05 LAB — PREPARE RBC (CROSSMATCH)

## 2014-01-05 MED ORDER — DOXERCALCIFEROL 4 MCG/2ML IV SOLN
2.0000 ug | INTRAVENOUS | Status: DC
  Administered 2014-01-06: 2 ug via INTRAVENOUS
  Filled 2014-01-05: qty 2

## 2014-01-05 MED ORDER — DARBEPOETIN ALFA-POLYSORBATE 60 MCG/0.3ML IJ SOLN
60.0000 ug | INTRAMUSCULAR | Status: DC
  Administered 2014-01-06: 60 ug via INTRAVENOUS
  Filled 2014-01-05: qty 0.3

## 2014-01-05 MED ORDER — INSULIN ASPART 100 UNIT/ML ~~LOC~~ SOLN
0.0000 [IU] | Freq: Three times a day (TID) | SUBCUTANEOUS | Status: DC
  Administered 2014-01-07: 2 [IU] via SUBCUTANEOUS
  Administered 2014-01-07 (×2): 3 [IU] via SUBCUTANEOUS
  Administered 2014-01-08: 2 [IU] via SUBCUTANEOUS

## 2014-01-05 MED ORDER — MIDODRINE HCL 5 MG PO TABS
5.0000 mg | ORAL_TABLET | Freq: Two times a day (BID) | ORAL | Status: DC
  Administered 2014-01-05 – 2014-01-08 (×5): 5 mg via ORAL
  Filled 2014-01-05 (×7): qty 1

## 2014-01-05 MED ORDER — SODIUM CHLORIDE 0.9 % IV SOLN
62.5000 mg | INTRAVENOUS | Status: DC
  Administered 2014-01-06: 62.5 mg via INTRAVENOUS
  Filled 2014-01-05 (×2): qty 5

## 2014-01-05 NOTE — Consult Note (Signed)
                                                                           [facility] Consult: 9:43 AM 01/05/2014  LOS: 1 day    Referring Provider: Dr Kakrakandy  Primary Care Physician:  HOOPER,[name], MD, [facility] Primary Gastroenterologist:  Dr. Gupta in [place]    Reason for Consultation:  GI bleed, anemia.  Melena   HPI: Tinika A Rizzolo is a 72 y.o. female.  Type 2, insulin requiring diabetic. Anemia of ESRD, requiring weekly iron infusions. Has ESRD, dialyzes MWF.  Yesterday during dialysis she felt unwell, weak.  No pain.  Hgb was low at 6.1, mcv 93, and she was sent to ED and then transferred to [place].  She had hx of intermittent tarry, black stools interspersed with normal brown stools for the past week.  No N/V until she vomitted bloody looking material once or twice in ED   No hx of GI bleed or ulcer.  Had EGD in ~2008, she is not aware of any pathology being found.  Had colonoscopy > 20 yrs ago, no pathology recalled on what sounds like routine screening study. She take Protonix PRN when she has reflux sxs, which are infrequent.   CT abdomen of 10/2011 showed sigmoid colitis.  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  colitis and she was treated then for C Diff +.    She was admitted to hospital 11/2013 with PNA and CHF.  Readmitted 2/23 - 2/27 with hypoxic resp failure, ? Due to volume overload.  Since latest admission she has had large hematoma on abdomen but no abdominal pain. Pt is hungry today and has not eaten most of yesterday and today.   Note elevation of AST and ALT into 400/500s. With otherwise normal LFTs    Past Medical History  Diagnosis Date  . Hypercholesterolemia   . Hypothyroidism   . Colitis 10/2011    C diff.   . Coronary artery disease     hx angina  . Hypertension   .  GERD (gastroesophageal reflux disease)   . Myocardial infarction   . Peripheral vascular disease     claudication.   . Hx of pulmonary embolus   . COPD (chronic obstructive pulmonary disease)     Nocturnal O2.   . Shortness of breath     uses O2 as needed  . Critical lower limb ischemia   . DVT (deep venous thrombosis) 2008    "right"  . Pneumonia     "several times"  . Type II diabetes mellitus     insulin requiring.   . ESRD (end stage renal disease) on dialysis     M-W-F   [place] (12/07/2013)  . CHF (congestive heart failure)     Past Surgical History  Procedure Laterality Date  . Tubal ligation    . Toe amputation Right     5th toe  . Tonsillectomy    . Insertion of dialysis catheter Left     chest  . Av fistula placement Right [id number]    RUE  . Bascilic vein transposition  May 18, 2012      First stage  . Bascilic vein transposition Left 07/27/12    Basilic Vein Transposition  . Arch aortogram  08/19/2012    Procedure: ARCH AORTOGRAM;  Surgeon: Brian L Chen, MD;  Location: [facility] OR;  Service: Vascular;  Laterality: N/A;  . Insertion of dialysis catheter  08/21/2012    Procedure: INSERTION OF DIALYSIS CATHETER;  Surgeon: Charles E Fields, MD;  Location: [facility] OR;  Service: Vascular;  Laterality: Left;  internal jugular    Prior to Admission medications   Medication Sig Start Date End Date Taking? Authorizing Provider  albuterol (PROVENTIL) (5 MG/ML) 0.5% nebulizer solution Take 2.5 mg by nebulization 2 (two) times daily. 10/31/13  Yes Stephen K Chiu, MD  aspirin EC 81 MG tablet Take 81 mg by mouth daily.   Yes Historical Provider, MD  calcium acetate (PHOSLO) 667 MG capsule Take 667 mg by mouth 3 (three) times daily with meals.   Yes Historical Provider, MD  guaiFENesin (MUCINEX) 600 MG 12 hr tablet Take 1 tablet (600 mg total) by mouth 2 (two) times daily as needed for cough. 12/30/13  Yes Alma M Devine, MD  isosorbide mononitrate (IMDUR) 120 MG 24 hr tablet Take 120 mg by  mouth daily.    Yes Historical Provider, MD  levothyroxine (SYNTHROID, LEVOTHROID) 25 MCG tablet Take 25 mcg by mouth daily.    Yes Historical Provider, MD  multivitamin (RENA-VIT) TABS tablet Take 1 tablet by mouth at bedtime. 12/30/13  Yes Alma M Devine, MD  nitroGLYCERIN (NITROSTAT) 0.4 MG SL tablet Place 0.4 mg under the tongue every 5 (five) minutes as needed. For chest pain.   Yes Historical Provider, MD  NOVOLIN R RELION 100 UNIT/ML injection Inject 6-10 Units into the skin 2 (two) times daily. If cbg 300 or 400 use 10 units, if cbg 200 use 6 unit, if cbg 160 use 2 units. Sliding scale 10/15/13  Yes Historical Provider, MD  pantoprazole (PROTONIX) 40 MG tablet Take 40 mg by mouth daily. Taking this only occasionally   Yes Historical Provider, MD  Probiotic Product (PROBIOTIC PO) Take 1 capsule by mouth daily as needed (digestion).    Yes Historical Provider, MD  simvastatin (ZOCOR) 10 MG tablet Take 10 mg by mouth every evening.   Yes Historical Provider, MD  tiotropium (SPIRIVA) 18 MCG inhalation capsule Place 1 capsule (18 mcg total) into inhaler and inhale daily. 11/13/13  Yes Jai-Gurmukh Samtani, MD  insulin aspart (NOVOLOG) 100 UNIT/ML injection Inject 2 Units into the skin 3 (three) times daily with meals. 12/30/13   Alma M Devine, MD  insulin glargine (LANTUS) 100 UNIT/ML injection Inject 0.17 mLs (17 Units total) into the skin at bedtime. 12/30/13   Alma M Devine, MD  midodrine (PROAMATINE) 5 MG tablet Take 1 tablet (5 mg total) by mouth 2 (two) times daily with a meal. 12/30/13   Alma M Devine, MD    Scheduled Meds: . albuterol  2.5 mg Nebulization BID  . budesonide  0.25 mg Nebulization BID  . insulin aspart  0-9 Units Subcutaneous TID WC  . levothyroxine  12.5 mcg Intravenous Daily  . [START ON 01/08/2014] pantoprazole (PROTONIX) IV  40 mg Intravenous Q12H  . sodium chloride  3 mL Intravenous Q12H  . sodium chloride  3 mL Intravenous Q12H  . tiotropium  18 mcg Inhalation Daily    Infusions: . pantoprozole (PROTONIX) infusion 8 mg/hr (01/04/14 2051)   PRN Meds: acetaminophen, acetaminophen, nitroGLYCERIN, ondansetron (ZOFRAN) IV, ondansetron   Allergies as of 01/04/2014 -   Review Complete 01/04/2014  Allergen Reaction Noted  . Procaine hcl Nausea And Vomiting and Other (See Comments) 06/12/2011    Family History  Problem Relation Age of Onset  . Diabetes type II    . Hypertension    . Diabetes Mother   . Kidney disease Mother   . Diabetes Father   . Anesthesia problems Neg Hx     History   Social History  . Marital Status: Widowed since1997    Spouse Name: N/A    Number of Children: N/A  . Years of Education: 10th grade.  Able to read without limits   Occupational History  . Hosiery and textile mill worker   Social History Main Topics  . Smoking status: Former Smoker -- 1.00 packs/day for 40 years    Types: Cigarettes    Quit date: 06/11/2011  . Smokeless tobacco: Never Used  . Alcohol Use: None for > 30 yrs, never heavy.   . Drug Use: No  . Sexual Activity: No   Other Topics Concern  . Not on file   Social History Narrative  . Her son and dtr in law live with pt    REVIEW OF SYSTEMS: Constitutional:  Weight fluctuation of 5 to 7 #  ENT:  No nose bleeds.  No teeth, rarely uses her dentures Pulm:  No SOB.  Frothy white sputum with cough.  CV:  No palpitations, + edema left foot.  GU:  No hematuria, no frequency.  Not anuric but + oliguric. GI:  Per HPI Renal:  dializes via catheter in left IJ Heme:  Per HPI   Transfusions:  In 2008 and on 01/04/14. Neuro:  No headaches, no peripheral tingling or numbness.  Balance ok, does not use cane or walker.  MS:  Some low back pain.  Derm:  No itching, no rash.  Non-healing sore on left heal  Endocrine:  No sweats or chills.  No polyuria or dysuria Immunization:  Up to date flu, pneumovax.  Travel:  None beyond 60 miles from home.    PHYSICAL EXAM: Vital signs in last 24 hours: Filed  Vitals:   01/05/14 0808  BP:   Pulse:   Temp: 98.9 F (37.2 C)  Resp:    Wt Readings from Last 3 Encounters:  01/05/14 67.2 kg (148 lb 2.4 oz)  12/30/13 62.6 kg (138 lb 0.1 oz)  12/07/13 66.089 kg (145 lb 11.2 oz)   General: pleasant, alert, pale elderly WF who looks in poor health Head:  No asymmetry or swelling  Eyes:  No icterus.  + pallor.  EOMI Ears:  Not HOH  Nose:  No discharge or congestion Mouth:  Clear, moist, no teeth, no lesions Neck:  No JVD or mass Lungs:  Clear bil.  But greatly diminished overall.  No dyspnea or cough.  Dialysis catheter on upper left chest.  Heart: RRR.  No mrg. Abdomen:  Soft, NT, ND.  No mass or HSM.  No bruits.  Large hematomas in lower abdomen..   Rectal: black, soft stool is FOB +.  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  Left heal ulcer bandaged.  Neurologic:  No asterixis or tremor, no limb weakness Skin:  No telangectasia Tattoos:  None Nodes:  No cervical adenopathy   Psych:  Pleasant, in good spirits, engaged.   Intake/Output from previous day: 03/04 0701 - 03/05 0700 In: 491.3 [I.V.:253.8; Blood:237.5] Out: -  Intake/Output this shift: Total   I/O In: 50 [I.V.:50] Out: 100 [Urine:100]  LAB RESULTS:  Recent Labs  01/04/14 1955 01/05/14 0750  WBC 17.1* 15.8*  HGB 7.4* 8.7*  HCT 22.6* 26.4*  PLT 189 219   BMET Lab Results  Component Value Date   NA 141 01/05/2014   NA 140 01/04/2014   NA 135* 12/30/2013   K 4.7 01/05/2014   K 4.8 01/04/2014   K 4.3 12/30/2013   CL 99 01/05/2014   CL 98 01/04/2014   CL 96 12/30/2013   CO2 23 01/05/2014   CO2 23 01/04/2014   CO2 27 12/30/2013   GLUCOSE 250* 01/05/2014   GLUCOSE 211* 01/04/2014   GLUCOSE 141* 12/30/2013   BUN 52* 01/05/2014   BUN 36* 01/04/2014   BUN 24* 12/30/2013   CREATININE 2.80* 01/05/2014   CREATININE 1.82* 01/04/2014   CREATININE 2.25* 12/30/2013   CALCIUM 7.6* 01/05/2014   CALCIUM 7.9* 01/04/2014   CALCIUM 9.0 12/30/2013     LFT  Recent Labs  01/04/14 1955  PROT 5.6*  ALBUMIN 3.1*  AST 519*  ALT 427*  ALKPHOS 82  BILITOT 0.6   PT/INR Lab Results  Component Value Date   INR 1.38 01/04/2014                  PT                             16.6 Hepatitis Panel Acute hepatitis panel negative in 10/2011, Hep B surface AB negative in 02/2012.   RADIOLOGY STUDIES: Dg Chest Port 1 View  01/05/2014   CLINICAL DATA:  Short of breath  EXAM: PORTABLE CHEST - 1 VIEW  COMPARISON:  01/04/2014  FINDINGS: Central venous catheter tip in the SVC is unchanged.  Mild vascular congestion has developed since the prior study reflecting mild fluid overload.  Mild left lower lobe atelectasis has developed. No significant effusion.  IMPRESSION: Interval development of mild vascular congestion and left lower lobe atelectasis.   Electronically Signed   By: Charles  Clark M.D.   On: 01/05/2014 07:17    ENDOSCOPIC STUDIES: Per HPI  IMPRESSION:   *  Upper GIB.  Rule out ulcer or angiodysplasia. *  Anemia, due to blood loss as well as ESRD.  On epogen and parenteral iron, given regularly at HD center.  Besides a GI source, she also has large abdominal hematoma that may be contributing. ,  S/p one unit PRBC in [place]. Hgb improved.  *  ESRD.  MWF dialysis *  IDDM *  COPD.  Recent admissions for flares, CHF and volume overload.  *  Elevated AST/ALT: 519/427 compared to 52/37 on 12/26/13.  Gallstones on 2012 CT scan but no hx liver disease.  No sxs to suggest this is synptomatic cholilithiasis Wonder if this is ischemic, given Troponin of 2.8 to 7.4?   *  Elevated Troponin I.     PLAN:     *  EGD when Dr Keenan Dimitrov feels this is safe. Needs cardiac clearance please.  *  Continue the Protonix infusion.    Sarah Gribbin  01/05/2014, 9:43 AM Pager: [phone]  GI ATTENDING  History, laboratories, x-rays reviewed. Patient seen and examined. Very complicated 72-year-old with multiple significant medical problems. Has chronic anemia.  Reports intermittent low-grade GI bleeding, likely upper. Has had NSTEMI. Seen by cardiology (extender note reviewed, attending to see). Hemodynamically stable. Agree with n.p.o. and PPI. Plan upper endoscopy tomorrow. The patient is   high-risk.The nature of the procedure, as well as the risks, benefits, and alternatives were carefully and thoroughly reviewed with the patient. Ample time for discussion and questions allowed. The patient understood, was satisfied, and agreed to proceed.. Transfuse to desire hemoglobin. In terms of LFTs, suspect SGOT and SGPT are of cardiac origin. Would trend.  Tameaka Eichhorn N. Havanah Nelms, Jr., M.D. [facility] of Gastroenterology      

## 2014-01-05 NOTE — Consult Note (Signed)
Indication for Consultation:  Management of ESRD/hemodialysis; anemia, hypertension/volume and secondary hyperparathyroidism  HPI: [name] [name] is a 73 y.o. female transferred from [facility] for report of melena and hgb 6.1 requiring blood transfusion. She receives HD MWF @ [place]. She was recently discharged [facility] for sob/volume excess/pleural effusions.Had thoracentesis, lowering of EDW, D/C of BB and addition of midodrine for hypotension associated with dialysis.   Since discharge she had been noticed dark stool but did not report this to anyone. She began to feel progressively weak and tired. During HD yesterday she said she felt terrible since she was so weak. In center hgb was 6.4 and EMS was called. At [facility] she received one unit PRBC and was transferred here where she received one more. She reports she is feeling much better now and wants to go home. Denies any chest pain; is chronically SOB and has not noted any worsening.  She had no abd pain associated with the dark stools. Was not getting any heparin with HD.  Past Medical History  Diagnosis Date  . Hypercholesterolemia   . Hypothyroidism   . Colitis 10/2011    C diff.   . Coronary artery disease     hx angina  . Hypertension   . GERD (gastroesophageal reflux disease)   . Myocardial infarction   . Peripheral vascular disease     claudication.   Marland Kitchen. Hx of pulmonary embolus   . COPD (chronic obstructive pulmonary disease)     Nocturnal O2.   . Shortness of breath     uses O2 as needed  . Critical lower limb ischemia   . DVT (deep venous thrombosis) 2008    "right"  . Pneumonia     "several times"  . Type II diabetes mellitus     insulin requiring.   Marland Kitchen. ESRD (end stage renal disease) on dialysis     M-W-F   [place] (12/07/2013)  . CHF (congestive heart failure)    Past Surgical History  Procedure Laterality Date  . Tubal ligation    . Toe amputation Right     5th toe  . Tonsillectomy    .  Insertion of dialysis catheter Left     chest  . Av fistula placement Right [id number]    RUE  . Bascilic vein transposition  May 18, 2012    First stage  . Bascilic vein transposition Left 07/27/12    Basilic Vein Transposition  . Arch aortogram  08/19/2012    Procedure: ARCH AORTOGRAM;  Surgeon: Fransisco HertzBrian L Chen, MD;  Location[facility] OR;  Service: Vascular;  Laterality: N/A;  . Insertion of dialysis catheter  08/21/2012    Procedure: INSERTION OF DIALYSIS CATHETER;  Surgeon: Sherren Kernsharles E Fields, MD;  [facility] OR;  Service: Vascular;  Laterality: Left;  internal jugular  . Cardiac catheterization  02/04/2012    LAD 40/99%, CRX OK, OM1 90% (small), RCA 90%/subtotal, failed PCI. Good L-R collaterals.   Family History  Problem Relation Age of Onset  . Diabetes type II    . Hypertension    . Diabetes Mother   . Kidney disease Mother   . Diabetes Father   . Anesthesia problems Neg Hx    Social History:  reports that she quit smoking about 2 years ago. Her smoking use included Cigarettes. She has a 40 pack-year smoking history. She has never used smokeless tobacco. She reports that she does not drink alcohol or use illicit drugs. Allergies  Allergen Reactions  .  Procaine Hcl Nausea And Vomiting and Other (See Comments)    [facility] has reaction of "comatose"   Prior to Admission medications   Medication Sig Start Date End Date Taking? Authorizing Provider  albuterol (PROVENTIL) (5 MG/ML) 0.5% nebulizer solution Take 2.5 mg by nebulization 2 (two) times daily. 10/31/13  Yes Jerald Kief, MD  aspirin EC 81 MG tablet Take 81 mg by mouth daily.   Yes Historical Provider, MD  calcium acetate (PHOSLO) 667 MG capsule Take 667 mg by mouth 3 (three) times daily with meals.   Yes Historical Provider, MD  guaiFENesin (MUCINEX) 600 MG 12 hr tablet Take 1 tablet (600 mg total) by mouth 2 (two) times daily as needed for cough. 12/30/13  Yes Alison Murray, MD  isosorbide mononitrate (IMDUR)  120 MG 24 hr tablet Take 120 mg by mouth daily.    Yes Historical Provider, MD  levothyroxine (SYNTHROID, LEVOTHROID) 25 MCG tablet Take 25 mcg by mouth daily.    Yes Historical Provider, MD  multivitamin (RENA-VIT) TABS tablet Take 1 tablet by mouth at bedtime. 12/30/13  Yes Alison Murray, MD  nitroGLYCERIN (NITROSTAT) 0.4 MG SL tablet Place 0.4 mg under the tongue every 5 (five) minutes as needed. For chest pain.   Yes Historical Provider, MD  NOVOLIN R RELION 100 UNIT/ML injection Inject 6-10 Units into the skin 2 (two) times daily. If cbg 300 or 400 use 10 units, if cbg 200 use 6 unit, if cbg 160 use 2 units. Sliding scale 10/15/13  Yes Historical Provider, MD  pantoprazole (PROTONIX) 40 MG tablet Take 40 mg by mouth daily.    Yes Historical Provider, MD  Probiotic Product (PROBIOTIC PO) Take 1 capsule by mouth daily as needed (digestion).    Yes Historical Provider, MD  simvastatin (ZOCOR) 10 MG tablet Take 10 mg by mouth every evening.   Yes Historical Provider, MD  tiotropium (SPIRIVA) 18 MCG inhalation capsule Place 1 capsule (18 mcg total) into inhaler and inhale daily. 11/13/13  Yes Rhetta Mura, MD  insulin aspart (NOVOLOG) 100 UNIT/ML injection Inject 2 Units into the skin 3 (three) times daily with meals. 12/30/13   Alison Murray, MD  insulin glargine (LANTUS) 100 UNIT/ML injection Inject 0.17 mLs (17 Units total) into the skin at bedtime. 12/30/13   Alison Murray, MD  midodrine (PROAMATINE) 5 MG tablet Take 1 tablet (5 mg total) by mouth 2 (two) times daily with a meal. 12/30/13   Alison Murray, MD   Current Facility-Administered Medications  Medication Dose Route Frequency Provider Last Rate Last Dose  . acetaminophen (TYLENOL) tablet 650 mg  650 mg Oral Q6H PRN Eduard Clos, MD       Or  . acetaminophen (TYLENOL) suppository 650 mg  650 mg Rectal Q6H PRN Eduard Clos, MD      . albuterol (PROVENTIL) (2.5 MG/3ML) 0.083% nebulizer solution 2.5 mg  2.5 mg Nebulization  BID Eduard Clos, MD   2.5 mg at 01/05/14 0943  . budesonide (PULMICORT) nebulizer solution 0.25 mg  0.25 mg Nebulization BID Eduard Clos, MD   0.25 mg at 01/05/14 0943  . insulin aspart (novoLOG) injection 0-9 Units  0-9 Units Subcutaneous TID WC Eduard Clos, MD   2 Units at 01/05/14 1249  . levothyroxine (SYNTHROID, LEVOTHROID) injection 12.5 mcg  12.5 mcg Intravenous Daily Eduard Clos, MD   12.5 mcg at 01/05/14 1015  . nitroGLYCERIN (NITROSTAT) SL tablet 0.4 mg  0.4  mg Sublingual Q5 min PRN Eduard Clos, MD      . ondansetron [facility]) tablet 4 mg  4 mg Oral Q6H PRN Eduard Clos, MD       Or  . ondansetron [facility]) injection 4 mg  4 mg Intravenous Q6H PRN Eduard Clos, MD      . pantoprazole (PROTONIX) 80 mg in sodium chloride 0.9 % 250 mL infusion  8 mg/hr Intravenous Continuous Eduard Clos, MD 25 mL/hr at 01/04/14 2051 8 mg/hr at 01/04/14 2051  . sodium chloride 0.9 % injection 3 mL  3 mL Intravenous Q12H Eduard Clos, MD   3 mL at 01/05/14 1015  . sodium chloride 0.9 % injection 3 mL  3 mL Intravenous Q12H Eduard Clos, MD      . tiotropium [facility]) inhalation capsule 18 mcg  18 mcg Inhalation Daily Eduard Clos, MD   18 mcg at 01/05/14 4098   Labs: Basic Metabolic Panel:  Recent Labs Lab 12/30/13 1503 01/04/14 1955 01/05/14 0750  NA 135* 140 141  K 4.3 4.8 4.7  CL 96 98 99  CO2 27 23 23   GLUCOSE 141* 211* 250*  BUN 24* 36* 52*  CREATININE 2.25* 1.82* 2.80*  CALCIUM 9.0 7.9* 7.6*  PHOS 2.1*  --   --    Liver Function Tests:  Recent Labs Lab 12/30/13 1503 01/04/14 1955  AST  --  519*  ALT  --  427*  ALKPHOS  --  82  BILITOT  --  0.6  PROT  --  5.6*  ALBUMIN 3.3* 3.1*   No results found for this basename: LIPASE, AMYLASE,  in the last 168 hours No results found for this basename: AMMONIA,  in the last 168 hours CBC:  Recent Labs Lab 12/30/13 1503 01/04/14 1955 01/05/14 0750  WBC  9.0 17.1* 15.8*  HGB 10.5* 7.4* 8.7*  HCT 33.3* 22.6* 26.4*  MCV 96.0 91.5 89.8  PLT 251 189 219   Cardiac Enzymes:  Recent Labs Lab 01/04/14 1955 01/05/14 01/05/14 0750 01/05/14 1055  TROPONINI 2.89* 4.01* 7.41* 5.52*   CBG:  Recent Labs Lab 12/30/13 1930 01/04/14 2134 01/04/14 2335 01/05/14 0359 01/05/14 0751  GLUCAP 133* 211* 222* 240* 234*   Iron Studies: No results found for this basename: IRON, TIBC, TRANSFERRIN, FERRITIN,  in the last 72 hours Studies/Results: Dg Chest Port 1 View  01/05/2014   CLINICAL DATA:  Short of breath  EXAM: PORTABLE CHEST - 1 VIEW  COMPARISON:  01/04/2014  FINDINGS: Central venous catheter tip in the SVC is unchanged.  Mild vascular congestion has developed since the prior study reflecting mild fluid overload.  Mild left lower lobe atelectasis has developed. No significant effusion.  IMPRESSION: Interval development of mild vascular congestion and left lower lobe atelectasis.   Electronically Signed   By: Marlan Palau M.D.   On: 01/05/2014 07:17    Review of Systems: Gen: Reports fatigue and weakness, improved s/p blood transfusion. Denies any fever, chills, sweats, anorexia, malaise, weight loss, and sleep disorder HEENT: No visual complaints, No history of Retinopathy. Normal external appearance No Epistaxis or Sore throat. No sinusitis.   CV: Denies chest pain, angina, palpitations, syncope, orthopnea, PND, peripheral edema, and claudication. Resp: Denies dyspnea at rest, dyspnea with exercise, cough, sputum, wheezing, coughing up blood, and pleurisy. GI: Reports dark stool since 2/27, no BM today. Denies vomiting blood, jaundice, and fecal incontinence.   Denies dysphagia or odynophagia. Denies abd pain. GU :  Denies urinary burning, blood in urine, urinary frequency, urinary hesitancy, nocturnal urination, and urinary incontinence.  No renal calculi. Voids multiple times a day MS: Denies joint pain, limitation of movement, and swelling,  stiffness, low back pain, extremity pain. Denies muscle weakness, cramps, atrophy.  No use of non steroidal antiinflammatory drugs. Derm: Denies rash, itching, dry skin, hives, moles, warts, or unhealing ulcers.  Psych: Denies depression, anxiety, memory loss, suicidal ideation, hallucinations, paranoia, and confusion. Heme: Denies bruising, and enlarged lymph nodes. Neuro: No headache.  No diplopia. No dysarthria.  No dysphasia.  No history of CVA.  No Seizures. No paresthesias.  No weakness. Endocrine No DM.  No Thyroid disease.  No Adrenal disease.  Physical Exam: Filed Vitals:   01/05/14 1100 01/05/14 1200 01/05/14 1300 01/05/14 1400  BP:  133/53 144/63 142/75  Pulse: 108 102 98 99  Temp:  98.5 F (36.9 C)    TempSrc:  Oral    Resp: 24  21 22   Height:      Weight:      SpO2: 100% 99% 91% 90%     General: Well developed, well nourished, in no acute distress. Son at bedside Head: Normocephalic, atraumatic, sclera non-icteric, mucus membranes are moist Neck: Supple. JVD not elevated. Lungs: Clear bilaterally, dim bases, without wheezes, rales, or rhonchi. Breathing is unlabored.scattered rhonchi posteriorly Heart: RRR with S1 S2. No murmurs, rubs, or gallops appreciated. Abdomen: Soft, non-tender, non-distended with normoactive bowel sounds. No rebound/guarding. No obvious abdominal masses. M-S:  Strength and tone appear normal for age. Lower extremities: trace L ankle edema, L heel ulcer Neuro: Alert and oriented X 3. Moves all extremities spontaneously. Psych:  Responds to questions appropriately with a normal affect. Dialysis Access: L IJ cath  Dialysis Orders: Dialysis: MWF [place]  4h 62.5kg 2K/2.5 [place] L IJ Cath 400/1.5 Prof 4 No heparin Hectorol 2 No epo Venofer 50/wk  Assessment/Plan: 1.  Melena/GI bleed- per primary. GI consulted. EGD planned. protonix gtt.  2. Elevated troponin- up to 7.41- cardiology consulted.  3.  ESRD -  MWF @ [place]. HD pending tomorrow.  K+4.7 4.  Hypertension/volume  - 142/75, intially hypotensive. Imdur on hold- watch. Will add midodrine, started last admission 5.  Anemia  - hgb 8.7 s/p 2 units RBC. Cont to watch- transfuse PRN. Will start ESA and cont iron (last tsat 17) 6.  Metabolic bone disease -  Ca+7.6  Phos 4.4, add phoslo when diet resumed. Cont hectorol. 7.  Nutrition - NPO. Renal when advanced.  Jetty Duhamel, NP [facility] [phone] 01/05/2014, 2:39 PM  I have seen and examined this patient and agree with assessment and plan as noted by B Barnetta Chapel. Pt with known CAD, COPD (O2dep), ESRD on MWF HD. Just discharged 2/27 following admission for SOB - had EDW lowered, BB stopped, midodrine added, thoracentesis.  Left the hospital with a Hb of 10.5. Developed dark stools in absence of any abd pain, then severe weakness, with Hb of 6.1 on presentation to [facility], with hypotension. Received a unit of PRBC's and transferred here where had another unit and Hb now up to 8.7. On protonix, awaiting clearance for EGD d/t elevated troponins (known CAD - Hb of 6 felt stressor; trop peaked at around 7). Currently comfortable, BP stable 130's,  Lungs with scattered rhonchi, non-tender abdomen.  Following for recurrent bleed, waiting for word on EGD, continuing protonix. Based on history source of bleed likely upper although with her BP issues on dialysis is a  setup for isch colitis - nothing to suggest that at this time, however.  Will maintain on usual MWF HD schedule (has not been getting heparin), continue midodrine for BP support on HD.   Jaysion Ramseyer B,MD 01/05/2014 4:16 PM

## 2014-01-05 NOTE — Consult Note (Signed)
CARDIOLOGY CONSULT NOTE   Patient ID: [name] [name] [name] MRN: [id number] DOB/AGE: 12/09/1940 73 y.o.  Admit date: 01/04/2014  Primary Physician   [name] [name] Mulling, [name] Primary Cardiologist   [name] [name] Reason for Consultation   NSTEMI  NWG:NFAO [name] [name] [name] is [name] [name] 73 y.o. female with [name] [name] history of CAD, cath in 2013 w/ failed PCI, medical therapy. 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 She was hospitalized 02/23-02/27 with acute respiratory failure and hypoxia, possibly secondary to CHF but no pulmonary edema seen on CXR. Discharged on her chronic home oxygen and nebulizers. She was admitted 03/04 with [name] [name] hemoglobin of 6 and heme-positive stools (seen by GI). S/p transfusion but cardiac enzymes have been elevated and cardiology was asked to evaluate her.  [name] [name] [name] has not (ever) had any chest pain. Her respiratory status has been at baseline since d/c 02/27. She is compliant w/ HD appts and medications. She became aware of dark stools during the past week and was very weak at HD yesterday. Labs checked for HD led to her admission. Since being transfused, she feels much better. Her activity level is poor at baseline, but she has had no exertional symptoms except weakness and DOE.     Past Medical History  Diagnosis Date  . Hypercholesterolemia   . Hypothyroidism   . Colitis 10/2011    C diff.   . Coronary artery disease     hx angina  . Hypertension   . GERD (gastroesophageal reflux disease)   . Myocardial infarction   . Peripheral vascular disease     claudication.   Marland Kitchen Hx of pulmonary embolus   . COPD (chronic obstructive pulmonary disease)     Nocturnal O2.   . Shortness of breath     uses O2 as needed  . Critical lower limb ischemia   . DVT (deep venous thrombosis) 2008    "right"  . Pneumonia     "several times"  . Type II diabetes mellitus     insulin requiring.   Marland Kitchen ESRD (end stage renal disease) on dialysis     M-W-F   [place] (12/07/2013)    . CHF (congestive heart failure)     Volume mgt with HD     Past Surgical History  Procedure Laterality Date  . Tubal ligation    . Toe amputation Right     5th toe  . Tonsillectomy    . Insertion of dialysis catheter Left     chest  . Av fistula placement Right [id number]    RUE  . Bascilic vein transposition  May 18, 2012    First stage  . Bascilic vein transposition Left 07/27/12    Basilic Vein Transposition  . Arch aortogram  08/19/2012    Procedure: ARCH AORTOGRAM;  Surgeon: [name] [name] [name];  Location: [facility] OR;  Service: Vascular;  Laterality: N/[name] [name];  . Insertion of dialysis catheter  08/21/2012    Procedure: INSERTION OF DIALYSIS CATHETER;  Surgeon: [name] [name] Kerns, [name];  Location: [name];  Service: Vascular;  Laterality: Left;  internal jugular  . Cardiac catheterization  02/04/2012    LAD 40/99%, CRX OK, OM1 90% (small), RCA 90%/subtotal, failed PCI. Good L-R collaterals.    Allergies  Allergen Reactions  . Procaine Hcl Nausea And Vomiting and Other (See Comments)    [name] [name] [name] Association has reaction of "comatose"    I have reviewed the patient's current medications . albuterol  2.5 mg Nebulization BID  . budesonide  0.25 mg Nebulization BID  . insulin aspart  0-9 Units Subcutaneous TID WC  . levothyroxine  12.5 mcg Intravenous Daily  . sodium chloride  3 mL Intravenous Q12H  . sodium chloride  3 mL Intravenous Q12H  . tiotropium  18 mcg Inhalation Daily   . pantoprozole (PROTONIX) infusion 8 mg/hr (01/04/14 2051)   acetaminophen, acetaminophen, nitroGLYCERIN, ondansetron (ZOFRAN) IV, ondansetron  Prior to Admission medications   Medication Sig Start Date End Date Taking? Authorizing Provider  albuterol (PROVENTIL) (5 MG/ML) 0.5% nebulizer solution Take 2.5 mg by nebulization 2 (two) times daily. 10/31/13  Yes [name] [name] Kief, [name]  aspirin EC 81 MG tablet Take 81 mg by mouth daily.   Yes Historical Provider, [name]  calcium acetate (PHOSLO) 667 MG capsule Take 667  mg by mouth 3 (three) times daily with meals.   Yes Historical Provider, [name]  guaiFENesin (MUCINEX) 600 MG 12 hr tablet Take 1 tablet (600 mg total) by mouth 2 (two) times daily as needed for cough. 12/30/13  Yes [name] [name] Murray, [name]  isosorbide mononitrate (IMDUR) 120 MG 24 hr tablet Take 120 mg by mouth daily.    Yes Historical Provider, [name]  levothyroxine (SYNTHROID, LEVOTHROID) 25 MCG tablet Take 25 mcg by mouth daily.    Yes Historical Provider, [name]  multivitamin (RENA-VIT) TABS tablet Take 1 tablet by mouth at bedtime. 12/30/13  Yes [name] [name] Murray, [name]  nitroGLYCERIN (NITROSTAT) 0.4 MG SL tablet Place 0.4 mg under the tongue every 5 (five) minutes as needed. For chest pain.   Yes Historical Provider, [name]  NOVOLIN R RELION 100 UNIT/ML injection Inject 6-10 Units into the skin 2 (two) times daily. If cbg 300 or 400 use 10 units, if cbg 200 use 6 unit, if cbg 160 use 2 units. Sliding scale 10/15/13  Yes Historical Provider, [name]  pantoprazole (PROTONIX) 40 MG tablet Take 40 mg by mouth daily.    Yes Historical Provider, [name]  Probiotic Product (PROBIOTIC PO) Take 1 capsule by mouth daily as needed (digestion).    Yes Historical Provider, [name]  simvastatin (ZOCOR) 10 MG tablet Take 10 mg by mouth every evening.   Yes Historical Provider, [name]  tiotropium (SPIRIVA) 18 MCG inhalation capsule Place 1 capsule (18 mcg total) into inhaler and inhale daily. 11/13/13  Yes [name] [name] Mura, [name]  insulin aspart (NOVOLOG) 100 UNIT/ML injection Inject 2 Units into the skin 3 (three) times daily with meals. 12/30/13   [name] [name] Murray, [name]  insulin glargine (LANTUS) 100 UNIT/ML injection Inject 0.17 mLs (17 Units total) into the skin at bedtime. 12/30/13   [name] [name] Murray, [name]  midodrine (PROAMATINE) 5 MG tablet Take 1 tablet (5 mg total) by mouth 2 (two) times daily with [name] [name] meal. 12/30/13   [name] [name] Murray, [name]     History   Social History  . Marital Status: Widowed    Spouse Name: N/[name] [name]    Number of Children: N/[name] [name]  . Years of Education:  N/[name] [name]   Occupational History  . Retired    Social History Main Topics  . Smoking status: Former Smoker -- 1.00 packs/day for 40 years    Types: Cigarettes    Quit date: 06/11/2011  . Smokeless tobacco: Never Used  . Alcohol Use: No  . Drug Use: No  . Sexual Activity: No   Other Topics Concern  . Not on file   Social History Narrative   Lives w/ son in [place].  Family Status  Relation Status Death Age  . Mother Alive   . Father Deceased    Family History  Problem Relation Age of Onset  . Diabetes type II    . Hypertension    . Diabetes Mother   . Kidney disease Mother   . Diabetes Father   . Anesthesia problems Neg Hx      ROS:  Full 14 point review of systems complete and found to be negative unless listed above.  Physical Exam: Blood pressure 142/75, pulse 99, temperature 98.5 F (36.9 C), temperature source Oral, resp. rate 22, height 5\' 5"  (1.651 m), weight 148 lb 2.4 oz (67.2 kg), SpO2 90.00%.  General: Well developed, chronically-ill appearing, female in no acute distress Head: Eyes PERRLA, No xanthomas.   Normocephalic and atraumatic, oropharynx without edema or exudate. Dentition: poor Lungs: rales bases Heart: HRRR S1 S2, no rub/gallop, no murmur. pulses are decreased in both lower extrem.   Neck: No carotid bruits. No lymphadenopathy.  JVD somewhat elevated. Abdomen: Bowel sounds present, abdomen soft and non-tender without masses or hernias noted. Msk:  No spine or cva tenderness. No weakness, no joint deformities or effusions. Extremities: No clubbing or cyanosis. No edema.  Neuro: Alert and oriented X 3. No focal deficits noted. Psych:  Good affect, responds appropriately Skin: No rashes or lesions noted.  Labs:   Lab Results  Component Value Date   WBC 15.8* 01/05/2014   HGB 8.7* 01/05/2014   HCT 26.4* 01/05/2014   MCV 89.8 01/05/2014   PLT 219 01/05/2014    Recent Labs  01/04/14 1955  INR 1.38     Recent Labs Lab 01/04/14 1955  01/05/14 0750  NA 140 141  K 4.8 4.7  CL 98 99  CO2 23 23  BUN 36* 52*  CREATININE 1.82* 2.80*  CALCIUM 7.9* 7.6*  PROT 5.6*  --   BILITOT 0.6  --   ALKPHOS 82  --   ALT 427*  --   AST 519*  --   GLUCOSE 211* 250*  ALBUMIN 3.1*  --    Magnesium  Date Value Ref Range Status  11/07/2013 1.9  1.5 - 2.5 mg/dL Final    Recent Labs  00/34/91 1955 01/05/14 01/05/14 0750 01/05/14 1055  TROPONINI 2.89* 4.01* 7.41* 5.52*   Pro B Natriuretic peptide (BNP)  Date/Time Value Ref Range Status  11/07/2013  4:55 AM 22434.0* 0 - 125 pg/mL Final  10/26/2013  5:10 AM 9552.0* 0 - 125 pg/mL Final   TSH  Date/Time Value Ref Range Status  10/26/2013  7:45 PM 1.278  0.350 - 4.500 uIU/mL Final     Performed at [facility]    Echo: 11/20/2013 Study Conclusions - Left ventricle: Poor acoustic windows limit study. LVEF is approximately 30 to 35% with akinesis of the distal anterior, apical, mid/distal lateral, basal inferior, distal inferior walls The cavity size was normal. Wall thickness was normal. Doppler parameters are consistent with abnormal left ventricular relaxation (grade 1 diastolic dysfunction). - Mitral valve: Mild regurgitation.  ECG:  04-Jan-2014 23:11:09  Normal sinus rhythm Possible Left atrial enlargement ST & T wave abnormality, consider inferior ischemia ST & T wave abnormality, consider anterolateral ischemia Prolonged QT Vent. rate 97 BPM PR interval 132 [name] QRS duration 94 [name] QT/QTc 374/474 [name] P-R-T axes 57 70 170  Cardiac Cath: 02/04/2012 Left main: Heavily calcified vessel with 20% distal stenosis.  Left Anterior Descending Artery: Large caliber vessel that courses the apex. The proximal vessel  is heavily calcified. The proximal vessel has diffuse 40% stenosis. There is [name] [name] discreet 40% stenosis just before the first diagonal branch. The distal LAD has diffuse 99% stenosis at the apex.  Circumflex Artery: Large caliber vessel with very small first and  second marginal branches. The second OM branch is 1.0 mm and has [name] [name] 90% ostial stenosis (too small for PCI). The third OM has minor plaque disease.  Right Coronary Artery: Large dominant vessel. The proximal vessel has [name] [name] tubular 90% stenosis. The mid vessel has [name] [name] chronic, sub-total occlusion. The distal vessel fills both from antegrade flow and from left to right collaterals.  Left Ventricular Angiogram: No LV gram  Impression:  1. 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  2. Attempted angioplasty of the mid RCA. 3 balloon inflations but unable to restore adequate flow. Unable to place [name] [name] stent secondary to vessel calcification and tortuosity.   Lexiscan stress test: 07/14/2013 QPS  Raw Data Images: Normal; no motion artifact; normal heart/lung ratio.  Stress Images: Normal homogeneous uptake in all areas of the myocardium.  Rest Images: Normal homogeneous uptake in all areas of the myocardium.  Subtraction (SDS): No evidence of ischemia.  Impression  Exercise Capacity: Lexiscan with no exercise.  BP Response: Normal blood pressure response.  Clinical Symptoms: No significant symptoms noted.  ECG Impression: No ischemia  Comparison with Prior Nuclear Study: No images to compare  Overall Impression: Low risk stress nuclear study Mild GI gut uptake. and mild gut uptaks  LV Wall Motion: Mild LV dysfunction; EF 48%  Radiology:  Dg Chest Port 1 View  01/05/2014   CLINICAL DATA:  Short of breath  EXAM: PORTABLE CHEST - 1 VIEW  COMPARISON:  01/04/2014  FINDINGS: Central venous catheter tip in the SVC is unchanged.  Mild vascular congestion has developed since the prior study reflecting mild fluid overload.  Mild left lower lobe atelectasis has developed. No significant effusion.  IMPRESSION: Interval development of mild vascular congestion and left lower lobe atelectasis.   Electronically Signed   By: Marlan Palau M.D.   On: 01/05/2014 07:17     ASSESSMENT AND PLAN:   The patient was seen today by Dr. Mayford Knife, the patient evaluated and the data reviewed.  73 yo female with CAD, PVD, ESRD on HD, DM, DVT, PE was admitted 03/04 from [name] with ABL anemia, elevated enzymes and UGIB.  NSTEMI - No ischemic symptoms and ECG with some minor changes but no ST elevation. Likely Type 2 in the setting of known significant CAD and Hgb 6. After reviewing data, including cath report from 2013 and echo from January 2015, no further workup is indicated. She cannot be anticoagulated, so no cath. She has known LVD, so do not think an echo would change treatment. Continue Rx for CAD as her medical condition will tolerate.   She is NPO, but could get NTG paste 1/2" q 6 hr and/or metoprolol 2.5 mg IV q 6 hr if OK with Renal/IM. Will continue to follow.  Otherwise, per IM/Renal teams. 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

## 2014-01-05 NOTE — Consult Note (Signed)
Patient interviewed and examined and chart reviewed.  Admitted with GI bleed with Hbg down to 6.  No chest pain.  She has known CAD with subtotaled RCA and high grade distal LAD.  She has ruled in for NSTEMI which is most likely type II demand ischemia in setting of severe anemia.  At this point no further cardiac workup recommended.  Would not pursue cath since patient cannot be anticoagulated.  Would continue nitrates and consider adding a beta blocker if ok with renal medicine.  Her BP and HR can tolerate a beta blocker but she is on Midodrine so unsure whether she has had problems with orthostasis in the past that would preclude addition of beta blocker.

## 2014-01-05 NOTE — Care Management Note (Signed)
    Page 1 of 1   01/05/2014     8:46:59 AM   CARE MANAGEMENT NOTE 01/05/2014  Patient:  [name] [name], [name] [name]   Account Number:  [id number]  Date Initiated:  01/05/2014  Documentation initiated by:  Junius Creamer  Subjective/Objective Assessment:   adm w anemia,gi bleed     Action/Plan:   lives w fan, o2 ahc, pcp dr j Quintella Reichert   Anticipated DC Date:     Anticipated DC Plan:           Choice offered to / List presented to:             Status of service:   Medicare Important Message given?   (If response is "NO", the following Medicare IM given date fields will be blank) Date Medicare IM given:   Date Additional Medicare IM given:    Discharge Disposition:    Per UR Regulation:  Reviewed for med. necessity/level of care/duration of stay  If discussed at Long Length of Stay Meetings, dates discussed:    Comments:

## 2014-01-05 NOTE — Consult Note (Signed)
WOC wound consult note Reason for Consult: evaluation of left heel ulcer. Pt followed by Dr. Wiliam Ke in the [facility]. Heel ulcer present per patient x 1 year. Pt is currently treating with Sanyl (enzymatic debridement ointment) and moist dressing.  Wound type: Stage III Pressure ulcer Pressure Ulcer POA: Yes Measurement: 1.0cm x 1.5cm x 0.5cm  Wound bed: pale, non granular, non healing Drainage (amount, consistency, odor) minimal serous, non purulent Periwound: 0.5-1.0cm hyperkeratotic and macerated area, needs serial debridement.  Epibole of wound edges Dressing procedure/placement/frequency: Silver hydrofiber into the wound base, cover with foam.  Apparent that ointment and moist gauze is macerating the periwound and this wound is larger than when my partner saw 1 month ago.  Offloading needed also, would leave that to the wound care center as outpatient.  Pt is moving around in the bed and up to the chair, she is aware to keep pressure off area when up in chair and in bed.   Discussed POC with patient and bedside nurse.  Re consult if needed, will not follow at this time. Thanks  Maxamillion Banas [employer], CWOCN [phone])

## 2014-01-05 NOTE — Progress Notes (Signed)
[facility]  DOUNIA NOTCH TDV:[id number] DOB: 10-Nov-1940 DOA: 01/04/2014 PCP: Quentin Mulling, MD  Brief narrative: 73 year old female patient history of chronic kidney disease on dialysis, CAD followed by cardiologist in [facility], prior history of DVT and PE and diabetes. During dialysis date of admission patient complained of weakness. Subsequent hemoglobin check revealed a hemoglobin of 6. Her stool was occult blood positive and noted to be melanotic in appearance. She was transferred to [facility] and upon presentation there was hypotensive. She was given a liter of normal saline and was transfused one unit of packed red blood cells and subsequently transferred to [facility] for further treatment. She has been taking an 1 mg aspirin for many years. No other NSAIDs. No associated chest pain or shortness of breath, no abdominal pain or diarrhea. After arrival to  she had an episode of nausea and vomiting which was bloody.  Assessment/Plan: Active Problems:   GI bleed/Melena -Seems consistent with upper GI -Appreciate gastroenterology evaluation -EGD planned once cleared by cardiology to proceed    Acute blood loss anemia -Hemoglobin up to 8.7 -Patient received one unit of packed red blood cells prior to arrival to [facility] received an additional unit after arrival here -Follow CBC every 12 hours   Acute hypoxemic respiratory failure -Chest x-ray this morning revealed interval development of mild vascular congestion and left lower lobe atelectasis-suspect related to need for volume restoration with blood and saline -Suspect can resolve with dialysis    Hypotension -Resolved after restoration of blood volume -Apparently has history with chronic hypotension ? 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    ESRD on dialysis -Per nephrology    HTN (hypertension) -Home Imdur are on hold    History of DVT (deep vein thrombosis)/History of pulmonary embolus (PE) -Currently not on chronic anticoagulation    Diabetes mellitus -Continue sliding scale insulin -Resume long-acting insulin once diet resumed    Hypothyroidism -Continue Synthroid   DVT prophylaxis: SCDs Code Status: Full Family Communication: No family at bedside Disposition Plan/Expected LOS: Step down   Consultants: Cardiology Nephrology Gastroenterology  Procedures: EGD pending  Antibiotics: None  HPI/Subjective: Patient alert and sitting up in chair. Denies any abdominal pain or recurrence of melena or bloody emesis since arrival.  Objective: Blood pressure 133/53, pulse 102, temperature 98.5 F (36.9 C), temperature source Oral, resp. rate 24, height 5\' 5"  (1.651 m), weight 148 lb 2.4 oz (67.2 kg), SpO2 99.00%.  Intake/Output Summary (Last 24 hours) at 01/05/14 1338 Last data filed at 01/05/14 1100  Gross per 24 hour  Intake 591.25 ml  Output    100 ml  Net 491.25 ml     Exam: General: No acute respiratory distress Lungs: Clear to auscultation bilaterally without wheezes  or crackles,  3 L Cardiovascular: Regular rate and rhythm without murmur  gallop or rub normal S1 and S2, no peripheral edema or JVD Abdomen: Nontender, nondistended, soft, bowel sounds positive, no rebound, no ascites, no appreciable mass Musculoskeletal: No significant cyanosis, clubbing of bilateral lower extremities Neurological: Alert and oriented x 3, moves all extremities x 4 without focal neurological deficits, CN 2-12 intact  Scheduled Meds:  Scheduled Meds: . albuterol  2.5 mg Nebulization BID  . budesonide  0.25 mg Nebulization BID  . insulin aspart  0-9 Units Subcutaneous TID WC  . levothyroxine  12.5 mcg Intravenous Daily  . sodium chloride  3 mL Intravenous Q12H  . sodium chloride  3 mL Intravenous Q12H  . tiotropium  18 mcg Inhalation Daily   Continuous Infusions: . pantoprozole (PROTONIX) infusion 8 mg/hr (01/04/14 2051)    Data Reviewed: Basic Metabolic Panel:  Recent Labs Lab 12/30/13 1503 01/04/14 1955 01/05/14 0750  NA 135* 140 141  K 4.3 4.8 4.7  CL 96 98 99  CO2 27 23 23   GLUCOSE 141* 211* 250*  BUN 24* 36* 52*  CREATININE 2.25* 1.82* 2.80*  CALCIUM 9.0 7.9* 7.6*  PHOS 2.1*  --   --    Liver Function Tests:  Recent Labs Lab 12/30/13 1503 01/04/14 1955  AST  --  519*  ALT  --  427*  ALKPHOS  --  82  BILITOT  --  0.6  PROT  --  5.6*  ALBUMIN 3.3* 3.1*   No results found for this basename: LIPASE, AMYLASE,  in the last 168 hours No results found for this basename: AMMONIA,  in the last 168 hours CBC:  Recent Labs Lab 12/30/13 1503 01/04/14 1955 01/05/14 0750  WBC 9.0 17.1* 15.8*  HGB 10.5* 7.4* 8.7*  HCT 33.3* 22.6* 26.4*  MCV 96.0 91.5 89.8  PLT 251 189 219   Cardiac Enzymes:  Recent Labs Lab 01/04/14 1955 01/05/14 01/05/14 0750 01/05/14 1055  TROPONINI 2.89* 4.01* 7.41* 5.52*   BNP (last 3 results)  Recent Labs  10/26/13 0510 11/07/13 0455  PROBNP 9552.0* 22434.0*   CBG:  Recent Labs Lab 12/30/13 1930 01/04/14 2134 01/04/14 2335 01/05/14 0359 01/05/14 0751  GLUCAP 133* 211* 222*  240* 234*    Recent Results (from the past 240 hour(s))  MRSA PCR SCREENING     Status: None   Collection Time    01/04/14  6:43 PM      Result Value Ref Range Status   MRSA by PCR NEGATIVE  NEGATIVE Final   Comment:            The GeneXpert MRSA Assay (FDA     approved for NASAL specimens     only), is one component of a     comprehensive MRSA colonization     surveillance program. It is not     intended to diagnose MRSA     infection nor to guide or     monitor treatment for     MRSA infections.     Studies:  Recent x-ray studies have been reviewed in detail by the Attending Physician  Time spent :     Junious Silkllison Ellis, ANP [facility] Office  [phone] Pager [phone]  **If unable to reach the above provider after paging please contact the Flow Manager @ [phone]  On-Call/Text Page:      [email or web address]      password [email or web address]  If 7PM-7AM, please contact night-coverage [email or web address] Password [email or web address] 01/05/2014, 1:38 PM  LOS: 1 day

## 2014-01-06 ENCOUNTER — Encounter (HOSPITAL_COMMUNITY): Payer: Self-pay

## 2014-01-06 ENCOUNTER — Encounter (HOSPITAL_COMMUNITY)
Admission: AD | Disposition: A | Discharge status: Home / Self Care | Payer: Self-pay | Source: Other Acute Inpatient Hospital | Attending: Internal Medicine

## 2014-01-06 ENCOUNTER — Encounter (HOSPITAL_COMMUNITY): Payer: Medicare Other | Admitting: Anesthesiology

## 2014-01-06 ENCOUNTER — Inpatient Hospital Stay (HOSPITAL_COMMUNITY): Payer: Medicare Other | Admitting: Anesthesiology

## 2014-01-06 DIAGNOSIS — E7800 Pure hypercholesterolemia, unspecified: Secondary | ICD-10-CM

## 2014-01-06 DIAGNOSIS — E039 Hypothyroidism, unspecified: Secondary | ICD-10-CM

## 2014-01-06 DIAGNOSIS — R799 Abnormal finding of blood chemistry, unspecified: Secondary | ICD-10-CM

## 2014-01-06 DIAGNOSIS — I10 Essential (primary) hypertension: Secondary | ICD-10-CM

## 2014-01-06 DIAGNOSIS — K31819 Angiodysplasia of stomach and duodenum without bleeding: Secondary | ICD-10-CM

## 2014-01-06 DIAGNOSIS — E119 Type 2 diabetes mellitus without complications: Secondary | ICD-10-CM

## 2014-01-06 DIAGNOSIS — I2510 Atherosclerotic heart disease of native coronary artery without angina pectoris: Secondary | ICD-10-CM

## 2014-01-06 HISTORY — PX: ESOPHAGOGASTRODUODENOSCOPY: SHX5428

## 2014-01-06 LAB — BASIC METABOLIC PANEL
BUN: 68 mg/dL — AB (ref 6–23)
CALCIUM: 7.7 mg/dL — AB (ref 8.4–10.5)
CO2: 23 mEq/L (ref 19–32)
Chloride: 99 mEq/L (ref 96–112)
Creatinine, Ser: 3.53 mg/dL — ABNORMAL HIGH (ref 0.50–1.10)
GFR calc Af Amer: 14 mL/min — ABNORMAL LOW (ref >90)
GFR calc non Af Amer: 12 mL/min — ABNORMAL LOW (ref >90)
GLUCOSE: 156 mg/dL — AB (ref 70–99)
Potassium: 3.7 mEq/L (ref 3.7–5.3)
Sodium: 139 mEq/L (ref 137–147)

## 2014-01-06 LAB — CBC
HCT: 25.6 % — ABNORMAL LOW (ref 36.0–46.0)
Hemoglobin: 8.4 g/dL — ABNORMAL LOW (ref 12.0–15.0)
MCH: 29.9 pg (ref 26.0–34.0)
MCHC: 32.8 g/dL (ref 30.0–36.0)
MCV: 91.1 fL (ref 78.0–100.0)
PLATELETS: 246 10*3/uL (ref 150–400)
RBC: 2.81 MIL/uL — ABNORMAL LOW (ref 3.87–5.11)
RDW: 17.1 % — AB (ref 11.5–15.5)
WBC: 16.2 10*3/uL — ABNORMAL HIGH (ref 4.0–10.5)

## 2014-01-06 LAB — TYPE AND SCREEN
ABO/RH(D): O POS
ANTIBODY SCREEN: NEGATIVE
Unit division: 0

## 2014-01-06 LAB — GLUCOSE, CAPILLARY
GLUCOSE-CAPILLARY: 189 mg/dL — AB (ref 70–99)
Glucose-Capillary: 189 mg/dL — ABNORMAL HIGH (ref 70–99)
Glucose-Capillary: 118 mg/dL — ABNORMAL HIGH (ref 70–99)
Glucose-Capillary: 241 mg/dL — ABNORMAL HIGH (ref 70–99)

## 2014-01-06 SURGERY — EGD (ESOPHAGOGASTRODUODENOSCOPY)
Anesthesia: Monitor Anesthesia Care

## 2014-01-06 MED ORDER — HEPARIN SODIUM (PORCINE) 1000 UNIT/ML DIALYSIS: 1000.0000 [IU] | INTRAMUSCULAR | Status: DC | PRN

## 2014-01-06 MED ORDER — SODIUM CHLORIDE 0.9 % IV SOLN: 100.0000 mL | INTRAVENOUS | Status: DC | PRN

## 2014-01-06 MED ORDER — PANTOPRAZOLE SODIUM 40 MG PO TBEC
40.0000 mg | DELAYED_RELEASE_TABLET | Freq: Every day | ORAL | Status: DC
  Administered 2014-01-07 – 2014-01-08 (×2): 40 mg via ORAL
  Filled 2014-01-06 (×2): qty 1

## 2014-01-06 MED ORDER — LIDOCAINE-PRILOCAINE 2.5-2.5 % EX CREA: 1.0000 "application " | TOPICAL_CREAM | CUTANEOUS | Status: DC | PRN

## 2014-01-06 MED ORDER — CARVEDILOL 3.125 MG PO TABS
3.1250 mg | ORAL_TABLET | Freq: Two times a day (BID) | ORAL | Status: DC
  Administered 2014-01-06: 3.125 mg via ORAL
  Filled 2014-01-06 (×2): qty 1

## 2014-01-06 MED ORDER — DOXERCALCIFEROL 4 MCG/2ML IV SOLN
INTRAVENOUS | Status: AC
  Filled 2014-01-06: qty 2

## 2014-01-06 MED ORDER — ATORVASTATIN CALCIUM 80 MG PO TABS
80.0000 mg | ORAL_TABLET | Freq: Every day | ORAL | Status: DC
  Administered 2014-01-06 – 2014-01-07 (×2): 80 mg via ORAL
  Filled 2014-01-06 (×3): qty 1

## 2014-01-06 MED ORDER — LIDOCAINE-PRILOCAINE 2.5-2.5 % EX CREA
1.0000 "application " | TOPICAL_CREAM | CUTANEOUS | Status: DC | PRN
  Filled 2014-01-06: qty 5

## 2014-01-06 MED ORDER — SODIUM CHLORIDE 0.9 % IV SOLN
INTRAVENOUS | Status: DC | PRN
  Administered 2014-01-06: 13:00:00 via INTRAVENOUS

## 2014-01-06 MED ORDER — LIDOCAINE HCL (PF) 1 % IJ SOLN
5.0000 mL | INTRAMUSCULAR | Status: DC | PRN
Start: 2014-01-06 — End: 2014-01-06

## 2014-01-06 MED ORDER — ASPIRIN EC 81 MG PO TBEC
81.0000 mg | DELAYED_RELEASE_TABLET | Freq: Every day | ORAL | Status: DC
  Administered 2014-01-07 – 2014-01-08 (×2): 81 mg via ORAL
  Filled 2014-01-06 (×3): qty 1

## 2014-01-06 MED ORDER — PENTAFLUOROPROP-TETRAFLUOROETH EX AERO: 1.0000 "application " | INHALATION_SPRAY | CUTANEOUS | Status: DC | PRN

## 2014-01-06 MED ORDER — PROPOFOL 10 MG/ML IV BOLUS
INTRAVENOUS | Status: DC | PRN
  Administered 2014-01-06: 30 mg via INTRAVENOUS

## 2014-01-06 MED ORDER — ALTEPLASE 2 MG IJ SOLR
2.0000 mg | Freq: Once | INTRAMUSCULAR | Status: DC | PRN
  Filled 2014-01-06: qty 2

## 2014-01-06 MED ORDER — DARBEPOETIN ALFA-POLYSORBATE 60 MCG/0.3ML IJ SOLN
INTRAMUSCULAR | Status: AC
  Filled 2014-01-06: qty 0.3

## 2014-01-06 MED ORDER — NEPRO/CARBSTEADY PO LIQD
237.0000 mL | ORAL | Status: DC | PRN
Start: 2014-01-06 — End: 2014-01-06

## 2014-01-06 MED ORDER — SODIUM CHLORIDE 0.9 % IV SOLN
100.0000 mL | INTRAVENOUS | Status: DC | PRN
Start: 2014-01-06 — End: 2014-01-06

## 2014-01-06 MED ORDER — ALTEPLASE 2 MG IJ SOLR: 2.0000 mg | Freq: Once | INTRAMUSCULAR | Status: DC | PRN

## 2014-01-06 MED ORDER — LEVOTHYROXINE SODIUM 25 MCG PO TABS
25.0000 ug | ORAL_TABLET | Freq: Every day | ORAL | Status: DC
  Administered 2014-01-07 – 2014-01-08 (×2): 25 ug via ORAL
  Filled 2014-01-06 (×3): qty 1

## 2014-01-06 MED ORDER — NEPRO/CARBSTEADY PO LIQD
237.0000 mL | ORAL | Status: DC | PRN
  Filled 2014-01-06: qty 237

## 2014-01-06 MED ORDER — SODIUM CHLORIDE 0.9 % IV SOLN: INTRAVENOUS | Status: DC

## 2014-01-06 MED ORDER — LIDOCAINE HCL (PF) 1 % IJ SOLN: 5.0000 mL | INTRAMUSCULAR | Status: DC | PRN

## 2014-01-06 NOTE — Op Note (Signed)
[facility] [address] [place], [address]   ENDOSCOPY PROCEDURE REPORT  PATIENT: [name] [name], [name] [name]  MR#: [id number] BIRTHDATE: 11-16-1940 , 72  yrs. old GENDER: Female ENDOSCOPIST: Roxy Cedar, MD REFERRED BY:  [facility] PROCEDURE DATE:  01/06/2014 PROCEDURE:  EGD w/ control of bleeding ASA CLASS:     Class III INDICATIONS:  Melena. MEDICATIONS: MAC sedation, administered by CRNA and See Anesthesia Report. TOPICAL ANESTHETIC: Cetacaine Spray  DESCRIPTION OF PROCEDURE: After the risks benefits and alternatives of the procedure were thoroughly explained, informed consent was obtained.  The PENTAX GASTOROSCOPE [id number] endoscope was introduced through the mouth and advanced to the second portion of the duodenum. Without limitations.  The instrument was slowly withdrawn as the mucosa was fully examined.    EXAM:The esophagus was normal.  The stomach was remarkable for a 3.5 mm AVM on the proximal body.  The stomach was otherwise normal. The duodenum revealed some deformity of the bulb/second portion, but was otherwise normal.  No active bleeding or blood in the upper gut.  THERAPY: APC probe used to successfully obliterate the gastric AVM.  Retroflexed views revealed no abnormalities.     The scope was then withdrawn from the patient and the procedure completed.  COMPLICATIONS: There were no complications. ENDOSCOPIC IMPRESSION: 1. Significant gastric AVM. Likely source for intermittent GI bleeding. Status post successful APC therapy 2. Otherwise normal exam  RECOMMENDATIONS: 1. Advance diet as tolerated 2. Transfuse to desire hemoglobin, per primary service 3. Okay to use aspirin for cardioprotective purposes.  REPEAT EXAM:  eSigned:  Roxy Cedar, MD 01/06/2014 1:29 PM   CC:

## 2014-01-06 NOTE — Progress Notes (Signed)
Subjective:  Seen on dialysis, still very weak, no dyspnea   Objective: Vital signs in last 24 hours: Temp:  [97.5 F (36.4 C)-98.7 F (37.1 C)] 97.5 F (36.4 C) (03/06 0715) Pulse Rate:  [85-108] 94 (03/06 0930) Resp:  [15-24] 18 (03/06 0715) BP: (107-153)/(47-106) 108/53 mmHg (03/06 0930) SpO2:  [90 %-100 %] 93 % (03/06 0715) Weight:  [65.5 kg (144 lb 6.4 oz)-67.8 kg (149 lb 7.6 oz)] 65.5 kg (144 lb 6.4 oz) (03/06 0715) Weight change: 1.4 kg (3 lb 1.4 oz)  Intake/Output from previous day: 03/05 0701 - 03/06 0700 In: 525 [I.V.:525] Out: 100 [Urine:100]   Lab Results:  Recent Labs  01/05/14 2003 01/06/14 0749  WBC 15.9* 16.2*  HGB 8.9* 8.4*  HCT 26.6* 25.6*  PLT 236 246   BMET:  Recent Labs  01/04/14 1955 01/05/14 0750 01/06/14 0257  NA 140 141 139  K 4.8 4.7 3.7  CL 98 99 99  CO2 23 23 23   GLUCOSE 211* 250* 156*  BUN 36* 52* 68*  CREATININE 1.82* 2.80* 3.53*  CALCIUM 7.9* 7.6* 7.7*  ALBUMIN 3.1*  --   --    No results found for this basename: PTH,  in the last 72 hours Iron Studies: No results found for this basename: IRON, TIBC, TRANSFERRIN, FERRITIN,  in the last 72 hours  EXAM: General appearance:  Alert, in no apparent distress Resp:  CTA without rales, rhonchi, or wheezes Cardio:  RRR without murmur or rub GI: + BS, soft and nontender Extremities:  No edema Access: L IJ catheter with BFR 400  Dialysis Orders: Dialysis: MWF [place]  4h 62.5kg 2K/2.5 Bath L IJ Cath 400/1.5 Prof 4 No heparin  Hectorol 2 No epo Venofer 50/wk  Assessment/Plan: 1. GI Bleeding - likely upper GIB, on IV Protonix with EGD pending cardiac clearance. 2. Elevated troponin - seen by Cardiology, most likely Type II demand ischemia in setting of severe anemia. 3. ESRD - HD on MWF @ [facility], K 3.7.  HD today.- no heparin 4. Hypotension/Volume - BP 108/53 on Midodrine; pre-HD wt 65.5 kg with UF goal of 3 L. 5. Anemia - Hgb 6 on 3/2, now 8.4 s/p transfusion, Aranesp 60 mcg today,  weekly Fe. 6. Sec HPT - Hectorol 4 mcg, Phoslo when diet resumes. 7. Nutrition - currently NPO, renal when diet resumes.   LOS: 2 days   LYLES,CHARLES 01/06/2014,9:50 AM  Patient seen and examined, agree with above note with above modifications.  Seen on HD- weak but hemodynamically stable- for EGD today, follow serial hgb.  HD MWF, no heparin.   Annie Sable, MD 01/06/2014

## 2014-01-06 NOTE — Anesthesia Preprocedure Evaluation (Signed)
Anesthesia Evaluation  Patient identified by MRN, date of birth, ID band Patient awake    Reviewed: Allergy & Precautions, H&P , NPO status , Patient's Chart, lab work & pertinent test results  Airway Mallampati: II  Neck ROM: Full    Dental  (+) Edentulous Upper, Edentulous Lower   Pulmonary shortness of breath, COPDformer smoker,  breath sounds clear to auscultation        Cardiovascular hypertension, + CAD, + Past MI and +CHF Rhythm:Regular Rate:Normal     Neuro/Psych    GI/Hepatic GERD-  ,  Endo/Other  diabetesHypothyroidism   Renal/GU ESRF and DialysisRenal disease     Musculoskeletal   Abdominal (+) + obese,   Peds  Hematology  (+) anemia ,   Anesthesia Other Findings   Reproductive/Obstetrics                           Anesthesia Physical Anesthesia Plan  ASA: IV  Anesthesia Plan: MAC   Post-op Pain Management:    Induction: Intravenous  Airway Management Planned: Natural Airway and Nasal Cannula  Additional Equipment:   Intra-op Plan:   Post-operative Plan:   Informed Consent: I have reviewed the patients History and Physical, chart, labs and discussed the procedure including the risks, benefits and alternatives for the proposed anesthesia with the patient or authorized representative who has indicated his/her understanding and acceptance.   Dental advisory given  Plan Discussed with: CRNA and Surgeon  Anesthesia Plan Comments:         Anesthesia Quick Evaluation

## 2014-01-06 NOTE — Procedures (Signed)
Patient was seen on dialysis and the procedure was supervised.  BFR 400  Via PC BP is  108/53.   Patient appears to be tolerating treatment well  [name] [name] A 01/06/2014

## 2014-01-06 NOTE — Progress Notes (Signed)
Patients blood pressure dropped around 6pm. Patient stated she didn't feel so well and very weak feeling. Patient back in bed and BP retaken. BP was 77/35 and 68/23. Md notified and transfer canceled. Will evaluate patient over night and possible transfer in AM.   Laniya Friedl, Charlaine Dalton RN

## 2014-01-06 NOTE — Transfer of Care (Signed)
Immediate Anesthesia Transfer of Care Note  Patient: [name] [name]  Procedure(s) Performed: Procedure(s): ESOPHAGOGASTRODUODENOSCOPY (EGD) (N/A)  Patient Location: Endoscopy Unit  Anesthesia Type:MAC  Level of Consciousness: awake, alert  and oriented  Airway & Oxygen Therapy: Patient Spontanous Breathing and Patient connected to nasal cannula oxygen  Post-op Assessment: Report given to PACU RN, Post -op Vital signs reviewed and stable and Patient moving all extremities  Post vital signs: Reviewed and stable  Complications: No apparent anesthesia complications

## 2014-01-06 NOTE — H&P (View-Only) (Signed)
[facility] Consult: 9:43 AM 01/05/2014  LOS: 1 day    Referring Provider: Dr Toniann Fail  Primary Care Physician:  Quentin Mulling, MD, [facility] Primary Gastroenterologist:  Dr. Chales Abrahams in [place]    Reason for Consultation:  GI bleed, anemia.  Melena   HPI: [name] [name] is a 73 y.o. female.  Type 2, insulin requiring diabetic. Anemia of ESRD, requiring weekly iron infusions. Has ESRD, dialyzes MWF.  Yesterday during dialysis she felt unwell, weak.  No pain.  Hgb was low at 6.1, mcv 93, and she was sent to ED and then transferred to [facility].  She had hx of intermittent tarry, black stools interspersed with normal brown stools for the past week.  No N/V until she vomitted bloody looking material once or twice in ED   No hx of GI bleed or ulcer.  Had EGD in ~2008, she is not aware of any pathology being found.  Had colonoscopy > 20 yrs ago, no pathology recalled on what sounds like routine screening study. She take Protonix PRN when she has reflux sxs, which are infrequent.   CT abdomen of 10/2011 showed sigmoid colitis.  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  colitis and she was treated then for C Diff +.    She was admitted to hospital 11/2013 with PNA and CHF.  Readmitted 2/23 - 2/27 with hypoxic resp failure, ? Due to volume overload.  Since latest admission she has had large hematoma on abdomen but no abdominal pain. Pt is hungry today and has not eaten most of yesterday and today.   Note elevation of AST and ALT into 400/500s. With otherwise normal LFTs    Past Medical History  Diagnosis Date  . Hypercholesterolemia   . Hypothyroidism   . Colitis 10/2011    C diff.   . Coronary artery disease     hx angina  . Hypertension   .  GERD (gastroesophageal reflux disease)   . Myocardial infarction   . Peripheral vascular disease     claudication.   Marland Kitchen Hx of pulmonary embolus   . COPD (chronic obstructive pulmonary disease)     Nocturnal O2.   . Shortness of breath     uses O2 as needed  . Critical lower limb ischemia   . DVT (deep venous thrombosis) 2008    "right"  . Pneumonia     "several times"  . Type II diabetes mellitus     insulin requiring.   Marland Kitchen ESRD (end stage renal disease) on dialysis     M-W-F   [place] (12/07/2013)  . CHF (congestive heart failure)     Past Surgical History  Procedure Laterality Date  . Tubal ligation    . Toe amputation Right     5th toe  . Tonsillectomy    . Insertion of dialysis catheter Left     chest  . Av fistula placement Right [id number]    RUE  . Bascilic vein transposition  May 18, 2012  First stage  . Bascilic vein transposition Left 07/27/12    Basilic Vein Transposition  . Arch aortogram  08/19/2012    Procedure: ARCH AORTOGRAM;  Surgeon: Fransisco HertzBrian L Chen, MD;  [facility];  Service: Vascular;  Laterality: N/A;  . Insertion of dialysis catheter  08/21/2012    Procedure: INSERTION OF DIALYSIS CATHETER;  Surgeon: Sherren Kernsharles E Fields, MD;  [facility];  Service: Vascular;  Laterality: Left;  internal jugular    Prior to Admission medications   Medication Sig Start Date End Date Taking? Authorizing Provider  albuterol (PROVENTIL) (5 MG/ML) 0.5% nebulizer solution Take 2.5 mg by nebulization 2 (two) times daily. 10/31/13  Yes Jerald KiefStephen K Chiu, MD  aspirin EC 81 MG tablet Take 81 mg by mouth daily.   Yes Historical Provider, MD  calcium acetate (PHOSLO) 667 MG capsule Take 667 mg by mouth 3 (three) times daily with meals.   Yes Historical Provider, MD  guaiFENesin (MUCINEX) 600 MG 12 hr tablet Take 1 tablet (600 mg total) by mouth 2 (two) times daily as needed for cough. 12/30/13  Yes Alison MurrayAlma M Devine, MD  isosorbide mononitrate (IMDUR) 120 MG 24 hr tablet Take 120 mg by  mouth daily.    Yes Historical Provider, MD  levothyroxine (SYNTHROID, LEVOTHROID) 25 MCG tablet Take 25 mcg by mouth daily.    Yes Historical Provider, MD  multivitamin (RENA-VIT) TABS tablet Take 1 tablet by mouth at bedtime. 12/30/13  Yes Alison MurrayAlma M Devine, MD  nitroGLYCERIN (NITROSTAT) 0.4 MG SL tablet Place 0.4 mg under the tongue every 5 (five) minutes as needed. For chest pain.   Yes Historical Provider, MD  NOVOLIN R RELION 100 UNIT/ML injection Inject 6-10 Units into the skin 2 (two) times daily. If cbg 300 or 400 use 10 units, if cbg 200 use 6 unit, if cbg 160 use 2 units. Sliding scale 10/15/13  Yes Historical Provider, MD  pantoprazole (PROTONIX) 40 MG tablet Take 40 mg by mouth daily. Taking this only occasionally   Yes Historical Provider, MD  Probiotic Product (PROBIOTIC PO) Take 1 capsule by mouth daily as needed (digestion).    Yes Historical Provider, MD  simvastatin (ZOCOR) 10 MG tablet Take 10 mg by mouth every evening.   Yes Historical Provider, MD  tiotropium (SPIRIVA) 18 MCG inhalation capsule Place 1 capsule (18 mcg total) into inhaler and inhale daily. 11/13/13  Yes Rhetta MuraJai-Gurmukh Samtani, MD  insulin aspart (NOVOLOG) 100 UNIT/ML injection Inject 2 Units into the skin 3 (three) times daily with meals. 12/30/13   Alison MurrayAlma M Devine, MD  insulin glargine (LANTUS) 100 UNIT/ML injection Inject 0.17 mLs (17 Units total) into the skin at bedtime. 12/30/13   Alison MurrayAlma M Devine, MD  midodrine (PROAMATINE) 5 MG tablet Take 1 tablet (5 mg total) by mouth 2 (two) times daily with a meal. 12/30/13   Alison MurrayAlma M Devine, MD    Scheduled Meds: . albuterol  2.5 mg Nebulization BID  . budesonide  0.25 mg Nebulization BID  . insulin aspart  0-9 Units Subcutaneous TID WC  . levothyroxine  12.5 mcg Intravenous Daily  . [START ON 01/08/2014] pantoprazole (PROTONIX) IV  40 mg Intravenous Q12H  . sodium chloride  3 mL Intravenous Q12H  . sodium chloride  3 mL Intravenous Q12H  . tiotropium  18 mcg Inhalation Daily    Infusions: . pantoprozole (PROTONIX) infusion 8 mg/hr (01/04/14 2051)   PRN Meds: acetaminophen, acetaminophen, nitroGLYCERIN, ondansetron (ZOFRAN) IV, ondansetron   Allergies as of 01/04/2014 -  Review Complete 01/04/2014  Allergen Reaction Noted  . Procaine hcl Nausea And Vomiting and Other (See Comments) 06/12/2011    Family History  Problem Relation Age of Onset  . Diabetes type II    . Hypertension    . Diabetes Mother   . Kidney disease Mother   . Diabetes Father   . Anesthesia problems Neg Hx     History   Social History  . Marital Status: Widowed [phone]    Spouse Name: N/A    Number of Children: N/A  . Years of Education: 10th grade.  Able to read without limits   Occupational History  . Hosiery and Producer, television/film/video   Social History Main Topics  . Smoking status: Former Smoker -- 1.00 packs/day for 40 years    Types: Cigarettes    Quit date: 06/11/2011  . Smokeless tobacco: Never Used  . Alcohol Use: None for > 30 yrs, never heavy.   . Drug Use: No  . Sexual Activity: No   Other Topics Concern  . Not on file   Social History Narrative  . Her son and dtr in law live with pt    REVIEW OF SYSTEMS: Constitutional:  Weight fluctuation of 5 to 7 #  ENT:  No nose bleeds.  No teeth, rarely uses her dentures Pulm:  No SOB.  Frothy white sputum with cough.  CV:  No palpitations, + edema left foot.  GU:  No hematuria, no frequency.  Not anuric but + oliguric. GI:  Per HPI Renal:  dializes via catheter in left IJ Heme:  Per HPI   Transfusions:  In 2008 and on 01/04/14. Neuro:  No headaches, no peripheral tingling or numbness.  Balance ok, does not use cane or walker.  MS:  Some low back pain.  Derm:  No itching, no rash.  Non-healing sore on left heal  Endocrine:  No sweats or chills.  No polyuria or dysuria Immunization:  Up to date flu, pneumovax.  Travel:  None beyond 60 miles from home.    PHYSICAL EXAM: Vital signs in last 24 hours: Filed  Vitals:   01/05/14 0808  BP:   Pulse:   Temp: 98.9 F (37.2 C)  Resp:    Wt Readings from Last 3 Encounters:  01/05/14 67.2 kg (148 lb 2.4 oz)  12/30/13 62.6 kg (138 lb 0.1 oz)  12/07/13 66.089 kg (145 lb 11.2 oz)   General: pleasant, alert, pale elderly WF who looks in poor health Head:  No asymmetry or swelling  Eyes:  No icterus.  + pallor.  EOMI Ears:  Not HOH  Nose:  No discharge or congestion Mouth:  Clear, moist, no teeth, no lesions Neck:  No JVD or mass Lungs:  Clear bil.  But greatly diminished overall.  No dyspnea or cough.  Dialysis catheter on upper left chest.  Heart: RRR.  No mrg. Abdomen:  Soft, NT, ND.  No mass or HSM.  No bruits.  Large hematomas in lower abdomen..   Rectal: black, soft stool is FOB +.  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  Left heal ulcer bandaged.  Neurologic:  No asterixis or tremor, no limb weakness Skin:  No telangectasia Tattoos:  None Nodes:  No cervical adenopathy   Psych:  Pleasant, in good spirits, engaged.   Intake/Output from previous day: 03/04 0701 - 03/05 0700 In: 491.3 [I.V.:253.8; Blood:237.5] Out: -  Intake/Output this shift: Total  I/O In: 50 [I.V.:50] Out: 100 [Urine:100]  LAB RESULTS:  Recent Labs  01/04/14 1955 01/05/14 0750  WBC 17.1* 15.8*  HGB 7.4* 8.7*  HCT 22.6* 26.4*  PLT 189 219   BMET Lab Results  Component Value Date   NA 141 01/05/2014   NA 140 01/04/2014   NA 135* 12/30/2013   K 4.7 01/05/2014   K 4.8 01/04/2014   K 4.3 12/30/2013   CL 99 01/05/2014   CL 98 01/04/2014   CL 96 12/30/2013   CO2 23 01/05/2014   CO2 23 01/04/2014   CO2 27 12/30/2013   GLUCOSE 250* 01/05/2014   GLUCOSE 211* 01/04/2014   GLUCOSE 141* 12/30/2013   BUN 52* 01/05/2014   BUN 36* 01/04/2014   BUN 24* 12/30/2013   CREATININE 2.80* 01/05/2014   CREATININE 1.82* 01/04/2014   CREATININE 2.25* 12/30/2013   CALCIUM 7.6* 01/05/2014   CALCIUM 7.9* 01/04/2014   CALCIUM 9.0 12/30/2013     LFT  Recent Labs  01/04/14 1955  PROT 5.6*  ALBUMIN 3.1*  AST 519*  ALT 427*  ALKPHOS 82  BILITOT 0.6   PT/INR Lab Results  Component Value Date   INR 1.38 01/04/2014                  PT                             16.6 Hepatitis Panel Acute hepatitis panel negative in 10/2011, Hep B surface AB negative in 02/2012.   RADIOLOGY STUDIES: Dg Chest Port 1 View  01/05/2014   CLINICAL DATA:  Short of breath  EXAM: PORTABLE CHEST - 1 VIEW  COMPARISON:  01/04/2014  FINDINGS: Central venous catheter tip in the SVC is unchanged.  Mild vascular congestion has developed since the prior study reflecting mild fluid overload.  Mild left lower lobe atelectasis has developed. No significant effusion.  IMPRESSION: Interval development of mild vascular congestion and left lower lobe atelectasis.   Electronically Signed   By: Marlan Palau M.D.   On: 01/05/2014 07:17    ENDOSCOPIC STUDIES: Per HPI  IMPRESSION:   *  Upper GIB.  Rule out ulcer or angiodysplasia. *  Anemia, due to blood loss as well as ESRD.  On epogen and parenteral iron, given regularly at HD center.  Besides a GI source, she also has large abdominal hematoma that may be contributing. ,  S/p one unit PRBC in [place]. Hgb improved.  *  ESRD.  MWF dialysis *  IDDM *  COPD.  Recent admissions for flares, CHF and volume overload.  *  Elevated AST/ALT: 519/427 compared to 52/37 on 12/26/13.  Gallstones on 2012 CT scan but no hx liver disease.  No sxs to suggest this is synptomatic cholilithiasis Wonder if this is ischemic, given Troponin of 2.8 to 7.4?   *  Elevated Troponin I.     PLAN:     *  EGD when Dr Marina Goodell feels this is safe. Needs cardiac clearance please.  *  Continue the Protonix infusion.    Jennye Moccasin  01/05/2014, 9:43 AM Pager: [phone]  GI ATTENDING  History, laboratories, x-rays reviewed. Patient seen and examined. Very complicated 73 year old with multiple significant medical problems. Has chronic anemia.  Reports intermittent low-grade GI bleeding, likely upper. Has had NSTEMI. Seen by cardiology (extender note reviewed, attending to see). Hemodynamically stable. Agree with n.p.o. and PPI. Plan upper endoscopy tomorrow. The patient is  high-risk.The nature of the procedure, as well as the risks, benefits, and alternatives were carefully and thoroughly reviewed with the patient. Ample time for discussion and questions allowed. The patient understood, was satisfied, and agreed to proceed.. Transfuse to desire hemoglobin. In terms of LFTs, suspect SGOT and SGPT are of cardiac origin. Would trend.  Wilhemina Bonito. Eda Keys., M.D. [facility] Division of Gastroenterology

## 2014-01-06 NOTE — Progress Notes (Addendum)
[facility]  Arna SnipeJudy A Paolini RUE:[id number] DOB: 06/12/1941 DOA: 01/04/2014 PCP: [name],[name], MD  Brief narrative: 73 year old female patient history of chronic kidney disease on dialysis, CAD [facility] by cardiologist [facility] [place], prior history of DVT and PE and diabetes. During dialysis date of admission patient complained of weakness. Subsequent hemoglobin check revealed a hemoglobin of 6. Her stool was occult blood positive and noted to be melanotic in appearance. [facility] and upon presentation there was hypotensive. She was given a liter of normal saline and was transfused one unit of packed red blood cells and subsequently [facility]. She has been taking an 1 mg aspirin for many years. No other NSAIDs. No associated chest pain or shortness of breath, no abdominal pain or diarrhea. After arrival to [place] she had an episode of nausea and vomiting which was bloody.  S/p EGD and APC for stomach angiodysplasia; ok to resume ASA and PPI changed to PO. Will move to telemetry and advance diet. Patient denies CP or SOB   Assessment/Plan:   GI bleed/Melena -Seems consistent with upper GI -Appreciate gastroenterology evaluation -S/P EGD and APC on 3/6 -ok to use ASA and advance diet -protonix changed to PO    Acute blood loss anemia -Hemoglobin 8.4 and stable -Patient received one unit of packed red blood cells prior to [facility] received an additional unit after arrival here -Follow CBC every 12 hours -transfuse for a goal of > 8.0   Acute on chronic hypoxemic respiratory failure -Chest x-ray this morning revealed interval development of mild vascular congestion and left lower lobe atelectasis-suspect related to volume restoration with blood and saline -as expected; improved and controlled with HD -patient deneis SOB. -continue chronic  O2 supplementation.    Hypotension -Resolved after restoration of blood volume -Apparently has history with chronic hypotension ? Related to HD; patient on midodrine prior to admission -currently stable -recurred after coreg added -will d/c coreg      CAD (coronary artery disease)/Elevated troponin/ Abnormal EKG/NSTEMI due to demand ischemia -Troponin has peaked at 7.41 -EKG was slightly more pronounced ST segment downsloping depression in the inferior leads as well as flattened ST segment elevation in the septal leads -Suspect NSTEMI in setting of demand ischemia from  hypotension and anemia -Cardiology consulted given history of known CAD -will follow rec's -coreg discontinue due to hypotension, cont statin and start ASA    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 -continue volume control with HD    ESRD on dialysis -Per nephrology    HTN (hypertension) -stable -will monitor    History of DVT (deep vein thrombosis)/History of pulmonary embolus (PE) -Currently not on chronic anticoagulation    Diabetes mellitus -Continue sliding scale insulin -will resume long acting insulin and will adjust base on CBG's fluctuation     Hypothyroidism -Continue Synthroid   DVT prophylaxis: SCDs Code Status: Full Family Communication: No family at bedside Disposition Plan/Expected LOS: transfer to tele   Consultants: Cardiology Nephrology Gastroenterology  Procedures: EGD pending  Antibiotics: None  HPI/Subjective: Patient afebrile, denies CP, SOB, abd pain, nausea, vomiting.  Objective: Blood pressure 116/29, pulse 100, temperature 97.4 F (36.3 C), temperature source Oral, resp. rate  19, height 5\' 5"  (1.651 m), weight 62.5 kg (137 lb 12.6 oz),  SpO2 91.00%.  Intake/Output Summary (Last 24 hours) at 01/06/14 1609 Last data filed at 01/06/14 1347  Gross per 24 hour  Intake    345 ml  Output   3000 ml  Net  -2655 ml     Exam: General: No acute respiratory distress Lungs: Clear to auscultation bilaterally without wheezes or crackles,  3 L Cardiovascular: Regular rate and rhythm without murmur gallop or rub normal S1 and S2, no peripheral edema or JVD Abdomen: Nontender, nondistended, soft, bowel sounds positive, no rebound, no ascites, no appreciable mass Musculoskeletal: No significant cyanosis, clubbing of bilateral lower extremities Neurological: Alert and oriented x 3, moves all extremities x 4 without focal neurological deficits, CN 2-12 intact  Scheduled Meds:  Scheduled Meds: . albuterol  2.5 mg Nebulization BID  . aspirin EC  81 mg Oral Daily  . atorvastatin  80 mg Oral q1800  . budesonide  0.25 mg Nebulization BID  . carvedilol  3.125 mg Oral BID WC  . darbepoetin      . darbepoetin (ARANESP) injection - DIALYSIS  60 mcg Intravenous Q Fri-HD  . doxercalciferol      . doxercalciferol  2 mcg Intravenous Q M,W,F-HD  . ferric gluconate (FERRLECIT/NULECIT) IV  62.5 mg Intravenous Weekly  . insulin aspart  0-9 Units Subcutaneous TID WC  . [START ON 01/07/2014] levothyroxine  25 mcg Oral QAC breakfast  . midodrine  5 mg Oral q12n4p  . [START ON 01/07/2014] pantoprazole  40 mg Oral Q0600  . sodium chloride  3 mL Intravenous Q12H  . sodium chloride  3 mL Intravenous Q12H  . tiotropium  18 mcg Inhalation Daily   Continuous Infusions:    Data Reviewed: Basic Metabolic Panel:  Recent Labs Lab 01/04/14 1955 01/05/14 0750 01/06/14 0257  NA 140 141 139  K 4.8 4.7 3.7  CL 98 99 99  CO2 23 23 23   GLUCOSE 211* 250* 156*  BUN 36* 52* 68*  CREATININE 1.82* 2.80* 3.53*  CALCIUM 7.9* 7.6* 7.7*   Liver Function Tests:  Recent Labs Lab 01/04/14 1955  AST 519*  ALT 427*  ALKPHOS 82  BILITOT 0.6  PROT 5.6*    ALBUMIN 3.1*   CBC:  Recent Labs Lab 01/04/14 1955 01/05/14 0750 01/05/14 2003 01/06/14 0749  WBC 17.1* 15.8* 15.9* 16.2*  HGB 7.4* 8.7* 8.9* 8.4*  HCT 22.6* 26.4* 26.6* 25.6*  MCV 91.5 89.8 89.9 91.1  PLT 189 219 236 246   Cardiac Enzymes:  Recent Labs Lab 01/04/14 1955 01/05/14 01/05/14 0750 01/05/14 1055  TROPONINI 2.89* 4.01* 7.41* 5.52*   BNP (last 3 results)  Recent Labs  10/26/13 0510 11/07/13 0455  PROBNP 9552.0* 22434.0*   CBG:  Recent Labs Lab 01/05/14 0359 01/05/14 0751 01/05/14 1228 01/05/14 1650 01/05/14 2158  GLUCAP 240* 234* 189* 135* 159*    Recent Results (from the past 240 hour(s))  MRSA PCR SCREENING     Status: None   Collection Time    01/04/14  6:43 PM      Result Value Ref Range Status   MRSA by PCR NEGATIVE  NEGATIVE Final   Comment:            The GeneXpert MRSA Assay (FDA     approved for NASAL specimens     only), is one component of a     comprehensive MRSA colonization     surveillance program. It is  not     intended to diagnose MRSA     infection nor to guide or     monitor treatment for     MRSA infections.     Armen Pickup [phone]  If 7PM-7AM, please contact night-coverage [email or web address] Password [email or web address] 01/06/2014, 4:09 PM   LOS: 2 days

## 2014-01-06 NOTE — Interval H&P Note (Signed)
History and Physical Interval Note:  01/06/2014 12:49 PM  [name] [name]  has presented today for surgery, with the diagnosis of gi bleed and anemia.  The various methods of treatment have been discussed with the patient and family. After consideration of risks, benefits and other options for treatment, the patient has consented to  Procedure(s): ESOPHAGOGASTRODUODENOSCOPY (EGD) (N/A) as a surgical intervention .  The patient's history has been reviewed, patient examined, no change in status, stable for surgery.  I have reviewed the patient's chart and labs.  Questions were answered to the patient's satisfaction.     [name]

## 2014-01-06 NOTE — Anesthesia Procedure Notes (Signed)
Procedure Name: MAC Date/Time: 01/06/2014 1:10 PM Performed by: Sarita Haver T Oxygen Delivery Method: Nasal cannula Placement Confirmation: positive ETCO2

## 2014-01-06 NOTE — Progress Notes (Addendum)
    Subjective:  Denies CP or dyspnea   Objective:  Filed Vitals:   01/06/14 0800 01/06/14 0830 01/06/14 0900 01/06/14 0930  BP: 116/55 119/47 107/56 108/53  Pulse: 94 95 94 94  Temp:      TempSrc:      Resp:      Height:      Weight:      SpO2:        Intake/Output from previous day:  Intake/Output Summary (Last 24 hours) at 01/06/14 1008 Last data filed at 01/06/14 0400  Gross per 24 hour  Intake    450 ml  Output      0 ml  Net    450 ml    Physical Exam: Physical exam: Well-developed chronically ill appearing in no acute distress.  Skin is warm and dry.  HEENT is normal.  Neck is supple.  Chest is clear to auscultation with normal expansion.  Cardiovascular exam is regular rate and rhythm.  Abdominal exam nontender or distended. No masses palpated. Extremities show no edema. neuro grossly intact    Lab Results: Basic Metabolic Panel:  Recent Labs  27/74/12 0750 01/06/14 0257  NA 141 139  K 4.7 3.7  CL 99 99  CO2 23 23  GLUCOSE 250* 156*  BUN 52* 68*  CREATININE 2.80* 3.53*  CALCIUM 7.6* 7.7*   CBC:  Recent Labs  01/05/14 2003 01/06/14 0749  WBC 15.9* 16.2*  HGB 8.9* 8.4*  HCT 26.6* 25.6*  MCV 89.9 91.1  PLT 236 246   Cardiac Enzymes:  Recent Labs  01/05/14 01/05/14 0750 01/05/14 1055  TROPONINI 4.01* 7.41* 5.52*     Assessment/Plan:  1 NSTEMI-Troponin significantly elevated. Possibly related to demand ischemia in the setting of GI bleed and severe anemia. We cannot give anticoagulation (either heparin or ASA); BP borderline; will try low dose beta blocker (note patient on midodrine and may not be able to tolerate); add statin; can consider cath in the future after she recovers from GI bleed (? 6-8 weeks pending results of ECD). Add ASA when ok with GI. 2 GI bleed-scheduled for EGD; follow hgb; continue protonix; per GI. 3 ESRD-dialysis per nephrology 4 ICM-will try low dose beta blocker; cannot add ACEI given borderline  BP.  Olga Millers 01/06/2014, 10:08 AM

## 2014-01-07 DIAGNOSIS — I509 Heart failure, unspecified: Secondary | ICD-10-CM

## 2014-01-07 DIAGNOSIS — I5042 Chronic combined systolic (congestive) and diastolic (congestive) heart failure: Secondary | ICD-10-CM

## 2014-01-07 DIAGNOSIS — I214 Non-ST elevation (NSTEMI) myocardial infarction: Secondary | ICD-10-CM

## 2014-01-07 LAB — CBC
HEMATOCRIT: 28 % — AB (ref 36.0–46.0)
Hemoglobin: 8.9 g/dL — ABNORMAL LOW (ref 12.0–15.0)
MCH: 30 pg (ref 26.0–34.0)
MCHC: 31.8 g/dL (ref 30.0–36.0)
MCV: 94.3 fL (ref 78.0–100.0)
PLATELETS: 244 10*3/uL (ref 150–400)
RBC: 2.97 MIL/uL — ABNORMAL LOW (ref 3.87–5.11)
RDW: 17.2 % — AB (ref 11.5–15.5)
WBC: 13.5 10*3/uL — AB (ref 4.0–10.5)

## 2014-01-07 LAB — GLUCOSE, CAPILLARY
GLUCOSE-CAPILLARY: 168 mg/dL — AB (ref 70–99)
GLUCOSE-CAPILLARY: 196 mg/dL — AB (ref 70–99)
Glucose-Capillary: 221 mg/dL — ABNORMAL HIGH (ref 70–99)
Glucose-Capillary: 237 mg/dL — ABNORMAL HIGH (ref 70–99)

## 2014-01-07 LAB — COMPREHENSIVE METABOLIC PANEL
ALK PHOS: 101 U/L (ref 39–117)
ALT: 3063 U/L — AB (ref 0–35)
AST: 2106 U/L — ABNORMAL HIGH (ref 0–37)
Albumin: 3.2 g/dL — ABNORMAL LOW (ref 3.5–5.2)
BILIRUBIN TOTAL: 0.4 mg/dL (ref 0.3–1.2)
BUN: 29 mg/dL — AB (ref 6–23)
CHLORIDE: 99 meq/L (ref 96–112)
CO2: 26 meq/L (ref 19–32)
Calcium: 8.2 mg/dL — ABNORMAL LOW (ref 8.4–10.5)
Creatinine, Ser: 2.59 mg/dL — ABNORMAL HIGH (ref 0.50–1.10)
GFR calc non Af Amer: 18 mL/min — ABNORMAL LOW (ref >90)
GFR, EST AFRICAN AMERICAN: 20 mL/min — AB (ref >90)
GLUCOSE: 158 mg/dL — AB (ref 70–99)
POTASSIUM: 4.1 meq/L (ref 3.7–5.3)
SODIUM: 140 meq/L (ref 137–147)
TOTAL PROTEIN: 5.9 g/dL — AB (ref 6.0–8.3)

## 2014-01-07 MED ORDER — ALTEPLASE 2 MG IJ SOLR: 2.0000 mg | Freq: Once | INTRAMUSCULAR | Status: AC | PRN

## 2014-01-07 MED ORDER — SODIUM CHLORIDE 0.9 % IV SOLN: 100.0000 mL | INTRAVENOUS | Status: DC | PRN

## 2014-01-07 MED ORDER — HEPARIN SODIUM (PORCINE) 1000 UNIT/ML DIALYSIS: 1000.0000 [IU] | INTRAMUSCULAR | Status: DC | PRN

## 2014-01-07 MED ORDER — CALCIUM ACETATE 667 MG PO CAPS
667.0000 mg | ORAL_CAPSULE | Freq: Three times a day (TID) | ORAL | Status: DC
  Administered 2014-01-07 – 2014-01-08 (×3): 667 mg via ORAL
  Filled 2014-01-07 (×6): qty 1

## 2014-01-07 MED ORDER — LIDOCAINE HCL (PF) 1 % IJ SOLN: 5.0000 mL | INTRAMUSCULAR | Status: DC | PRN

## 2014-01-07 MED ORDER — LIDOCAINE-PRILOCAINE 2.5-2.5 % EX CREA: 1.0000 "application " | TOPICAL_CREAM | CUTANEOUS | Status: DC | PRN

## 2014-01-07 MED ORDER — PENTAFLUOROPROP-TETRAFLUOROETH EX AERO
1.0000 | INHALATION_SPRAY | CUTANEOUS | Status: DC | PRN
Start: 2014-01-07 — End: 2014-01-08

## 2014-01-07 MED ORDER — NEPRO/CARBSTEADY PO LIQD: 237.0000 mL | ORAL | Status: DC | PRN

## 2014-01-07 NOTE — Progress Notes (Signed)
[facility]  [name] [name] ZOX:[id number] DOB: [birth year] DOA: 01/04/2014 PCP: Quentin Mulling, MD  Brief narrative: 73 year old female patient history of chronic kidney disease on dialysis, CAD followed by cardiologist in [facility], prior history of DVT and PE and diabetes. During dialysis date of admission patient complained of weakness. Subsequent hemoglobin check revealed a hemoglobin of 6. Her stool was occult blood positive and noted to be melanotic in appearance. She was transferred to [facility] and upon presentation there was hypotensive. She was given a liter of normal saline and was transfused one unit of packed red blood cells and subsequently transferred to [facility] for further treatment. She has been taking an 1 mg aspirin for many years. No other NSAIDs. No associated chest pain or shortness of breath, no abdominal pain or diarrhea. After arrival to [place] she had an episode of nausea and vomiting which was bloody.  S/p EGD and APC for stomach angiodysplasia; ok to resume ASA and PPI changed to PO. Will move to telemetry and advance diet. Patient denies CP or SOB   Assessment/Plan:   GI bleed/Melena -Seems consistent with upper GI -Appreciate gastroenterology evaluation -S/P EGD and APC on 3/6 noting gastric AV malformation -ok to use ASA and advance diet -protonix changed to PO Hemoglobin up today    Acute blood loss anemia -Hemoglobin 8.4 and stable -Patient received one unit of packed red blood cells prior to arrival to [facility] received an additional unit after arrival here -Follow CBC every 12 hours -transfuse for a goal of > 8.0   Acute on chronic hypoxemic respiratory failure -Chest x-ray this morning revealed interval development of mild vascular congestion and left lower lobe atelectasis-suspect related to volume restoration with blood and saline -as expected; improved and  controlled with HD -Patient's breathing at baseline -continue chronic O2 supplementation.    Hypotension -Resolved after restoration of blood volume although still low at times -Apparently has history with chronic hypotension ? Related to HD; patient on midodrine prior to admission -currently stable -recurred after coreg added -will d/c coreg      CAD (coronary artery disease)/Elevated troponin/ Abnormal EKG/NSTEMI due to demand ischemia -Troponin has peaked at 7.41 -EKG was slightly more pronounced ST segment downsloping depression in the inferior leads as well as flattened ST segment elevation in the septal leads -Suspect NSTEMI in setting of demand ischemia from  hypotension and anemia Cardiology feels that with her known high grade stenoses in LAD and RCA with downstream ischemic myocardium, likely cause of infarction in the setting of acute GI bleed. Medical management options are severely limited by hypotension and bleeding problems.  Ranexa could be used, but of doubtful benefit in this angina free patient. 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 -continue volume control with HD    ESRD on dialysis -Per nephrology    HTN (hypertension) -stable -will monitor    History of DVT (deep vein thrombosis)/History of pulmonary embolus (PE) -Currently not on chronic anticoagulation    Diabetes mellitus -Continue sliding scale insulin -will resume long acting insulin and will adjust base on CBG's fluctuation   Transaminitis:  She also has markedly elevated LFTs suggesting severe  systemic hypoperfusion ("shock liver") which also have caused myocardial injury.      Hypothyroidism -Continue Synthroid   DVT prophylaxis: SCDs Code Status: Full Family Communication: No family at bedside Disposition Plan/Expected LOS: Likely discharge in next 24-48 hours   Consultants: Cardiology Nephrology Gastroenterology  Procedures: EGD done 3/6: Noting significant gastric AV malformation  Antibiotics: None  HPI/Subjective: Patient afebrile, denies CP, SOB, abd pain, nausea, vomiting.  Objective: Blood pressure 82/22, pulse 86, temperature 97.3 F (36.3 C), temperature source Oral, resp. rate 19, height 5\' 5"  (1.651 m), weight 64.9 kg (143 lb 1.3 oz), SpO2 96.00%.  Intake/Output Summary (Last 24 hours) at 01/07/14 1447 Last data filed at 01/06/14 2200  Gross per 24 hour  Intake    120 ml  Output      0 ml  Net    120 ml     Exam: General: No acute respiratory distress Lungs: Clear to auscultation bilaterally without wheezes or crackles,  3 L Cardiovascular: Regular rate and rhythm without murmur gallop or rub normal S1 and S2, no peripheral edema or JVD Abdomen: Nontender, nondistended, soft, bowel sounds positive, no rebound, no ascites, no appreciable mass Musculoskeletal: No significant cyanosis, clubbing of bilateral lower extremities Neurological: Alert and oriented x 3, moves all extremities x 4 without focal neurological deficits, CN 2-12 intact  Scheduled Meds:  Scheduled Meds: . albuterol  2.5 mg Nebulization BID  . aspirin EC  81 mg Oral Daily  . atorvastatin  80 mg Oral q1800  . budesonide  0.25 mg Nebulization BID  . calcium acetate  667 mg Oral TID WC  . darbepoetin (ARANESP) injection - DIALYSIS  60 mcg Intravenous Q Fri-HD  . doxercalciferol  2 mcg Intravenous Q M,W,F-HD  . ferric gluconate (FERRLECIT/NULECIT) IV  62.5 mg Intravenous Weekly  . insulin aspart  0-9 Units Subcutaneous TID WC  . levothyroxine  25 mcg Oral QAC breakfast  . midodrine  5 mg Oral q12n4p  . pantoprazole  40 mg Oral Q0600  . sodium chloride  3 mL  Intravenous Q12H  . sodium chloride  3 mL Intravenous Q12H  . tiotropium  18 mcg Inhalation Daily   Continuous Infusions:    Data Reviewed: Basic Metabolic Panel:  Recent Labs Lab 01/04/14 1955 01/05/14 0750 01/06/14 0257 01/07/14 0251  NA 140 141 139 140  K 4.8 4.7 3.7 4.1  CL 98 99 99 99  CO2 23 23 23 26   GLUCOSE 211* 250* 156* 158*  BUN 36* 52* 68* 29*  CREATININE 1.82* 2.80* 3.53* 2.59*  CALCIUM 7.9* 7.6* 7.7* 8.2*   Liver Function Tests:  Recent Labs Lab 01/04/14 1955 01/07/14 0251  AST 519* 2106*  ALT 427* 3063*  ALKPHOS 82 101  BILITOT 0.6 0.4  PROT 5.6* 5.9*  ALBUMIN 3.1* 3.2*   CBC:  Recent Labs Lab 01/04/14 1955 01/05/14 0750 01/05/14 2003 01/06/14 0749 01/07/14 0251  WBC 17.1* 15.8* 15.9* 16.2* 13.5*  HGB 7.4* 8.7* 8.9* 8.4* 8.9*  HCT 22.6* 26.4* 26.6* 25.6* 28.0*  MCV 91.5 89.8 89.9 91.1 94.3  PLT 189 219 236 246 244   Cardiac Enzymes:  Recent Labs Lab 01/04/14 1955 01/05/14 01/05/14 0750 01/05/14 1055  TROPONINI 2.89* 4.01* 7.41* 5.52*   BNP (last 3 results)  Recent Labs  10/26/13 0510 11/07/13 0455  PROBNP 9552.0* 22434.0*   CBG:  Recent Labs Lab 01/05/14 2158 01/06/14 1631 01/06/14 2153 01/07/14 0739 01/07/14 1213  GLUCAP 159* 118* 241* 168* 221*    Recent Results (  from the past 240 hour(s))  MRSA PCR SCREENING     Status: None   Collection Time    01/04/14  6:43 PM      Result Value Ref Range Status   MRSA by PCR NEGATIVE  NEGATIVE Final   Comment:            The GeneXpert MRSA Assay (FDA     approved for NASAL specimens     only), is one component of a     comprehensive MRSA colonization     surveillance program. It is not     intended to diagnose MRSA     infection nor to guide or     monitor treatment for     MRSA infections.     Hollice EspyKRISHNAN,SENDIL K [phone]  If 7PM-7AM, please contact night-coverage [email or web address] Password [email or web address] 01/07/2014, 2:47 PM   LOS: 3 days

## 2014-01-07 NOTE — Progress Notes (Signed)
Subjective:  Events noted- had EGD- AVM in stomach treated- Had low BP overnight- hgb low but stable  Objective: Vital signs in last 24 hours: Temp:  [97.4 F (36.3 C)-99.5 F (37.5 C)] 97.5 F (36.4 C) (03/07 0740) Pulse Rate:  [52-100] 86 (03/07 0740) Resp:  [17-24] 22 (03/07 0740) BP: (68-147)/(16-97) 123/49 mmHg (03/07 0740) SpO2:  [90 %-100 %] 97 % (03/07 0740) Weight:  [62.5 kg (137 lb 12.6 oz)-64.9 kg (143 lb 1.3 oz)] 64.9 kg (143 lb 1.3 oz) (03/07 0429) Weight change: -2.3 kg (-5 lb 1.1 oz)  Intake/Output from previous day: 03/06 0701 - 03/07 0700 In: 165 [P.O.:120; I.V.:45] Out: 3000    Lab Results:  Recent Labs  01/06/14 0749 01/07/14 0251  WBC 16.2* 13.5*  HGB 8.4* 8.9*  HCT 25.6* 28.0*  PLT 246 244   BMET:  Recent Labs  01/04/14 1955  01/06/14 0257 01/07/14 0251  NA 140  < > 139 140  K 4.8  < > 3.7 4.1  CL 98  < > 99 99  CO2 23  < > 23 26  GLUCOSE 211*  < > 156* 158*  BUN 36*  < > 68* 29*  CREATININE 1.82*  < > 3.53* 2.59*  CALCIUM 7.9*  < > 7.7* 8.2*  ALBUMIN 3.1*  --   --  3.2*  < > = values in this interval not displayed. No results found for this basename: PTH,  in the last 72 hours Iron Studies: No results found for this basename: IRON, TIBC, TRANSFERRIN, FERRITIN,  in the last 72 hours  EXAM: General appearance:  Alert, in no apparent distress Resp:  CTA without rales, rhonchi, or wheezes Cardio:  RRR without murmur or rub GI: + BS, soft and nontender Extremities:  No edema Access: L IJ catheter with BFR 400  Dialysis Orders: Dialysis: MWF [place]  4h 62.5kg 2K/2.5 [place] L IJ Cath 400/1.5 Prof 4 No heparin  Hectorol 2 No epo Venofer 50/wk  Assessment/Plan: 1. GI Bleeding - likely upper GIB- AVM in stomach, s/p treatment per GI, on IV Protonix  2. Elevated troponin - seen by Cardiology, most likely Type II demand ischemia in setting of severe anemia. 3. ESRD - HD on MWF via PC @ [facility], K 4.1.  HD next on Monday 4. Hypotension/Volume -  BP 108/53 on Midodrine;  5. Anemia - Hgb 6 on 3/2, now 8.9 s/p transfusion, Aranesp 60 mcg today, weekly Fe. 6. Sec HPT - Hectorol 4 mcg, Phoslo  7. Nutrition - currently NPO, renal when diet resumes.   LOS: 3 days   Gibril Mastro A 01/07/2014,8:13 AM

## 2014-01-07 NOTE — Progress Notes (Signed)
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 albuterol  2.5 mg Nebulization BID  . aspirin EC  81 mg Oral Daily  . atorvastatin  80 mg Oral q1800  . budesonide  0.25 mg Nebulization BID  . calcium acetate  667 mg Oral TID WC  . darbepoetin (ARANESP) injection - DIALYSIS  60 mcg Intravenous Q Fri-HD  . doxercalciferol  2 mcg Intravenous Q M,W,F-HD  . ferric gluconate (FERRLECIT/NULECIT) IV  62.5 mg Intravenous Weekly  . insulin aspart  0-9 Units Subcutaneous TID WC  . levothyroxine  25 mcg Oral QAC breakfast  . midodrine  5 mg Oral q12n4p  . pantoprazole  40 mg Oral Q0600  . sodium chloride  3 mL Intravenous Q12H  . sodium chloride  3 mL Intravenous Q12H  . tiotropium  18 mcg Inhalation Daily    OBJECTIVE   Intake/Output Summary (Last 24 hours) at 01/07/14 1126 Last data filed at 01/06/14 2200  Gross per 24 hour  Intake    165 ml  Output      0 ml  Net    165 ml   Filed Weights   01/06/14 0715 01/06/14 1123 01/07/14 0429  Weight: 65.5 kg (144 lb 6.4 oz) 62.5 kg (137 lb 12.6 oz) 64.9 kg (143 lb 1.3 oz)    PHYSICAL EXAM Filed Vitals:   01/07/14 0400 01/07/14 0429 01/07/14 0740 01/07/14 0947  BP: 98/38  123/49   Pulse: 83  86   Temp:  97.9 F (36.6 C) 97.5 F (36.4 C)   TempSrc:  Oral Oral   Resp: 22  22   Height:      Weight:  64.9 kg (143 lb 1.3 oz)    SpO2: 92%  97% 92%   General:  Alert, oriented x3, no distress, pale, appears chronically ill Head: no evidence of trauma, PERRL, EOMI, no exophtalmos or lid lag, no myxedema, no xanthelasma; normal ears, nose and oropharynx Neck: normal jugular venous pulsations and no hepatojugular reflux; brisk carotid pulses without delay and no carotid bruits Chest: clear to auscultation, no signs of consolidation by percussion or palpation, normal fremitus, symmetrical and full respiratory excursions Cardiovascular: normal position and quality of the apical impulse, regular rhythm, normal first and second heart sounds, no rubs or gallops, no murmur Abdomen: no tenderness or distention, no masses by palpation, no abnormal pulsatility or arterial bruits, normal bowel sounds, no hepatosplenomegaly Extremities: no clubbing, cyanosis or edema; 2+ radial, ulnar and brachial pulses bilaterally; 2+ right femoral, posterior tibial and dorsalis pedis pulses; 2+ left femoral, posterior tibial and dorsalis pedis pulses; no subclavian or femoral bruits Neurological: grossly nonfocal  LABS  CBC  Recent Labs  01/06/14 0749 01/07/14 0251  WBC 16.2* 13.5*  HGB 8.4* 8.9*  HCT 25.6* 28.0*  MCV 91.1 94.3  PLT 246 244   Basic Metabolic  Panel  Recent Labs  01/06/14 0257 01/07/14 0251  NA 139 140  K 3.7 4.1  CL 99 99  CO2 23 26  GLUCOSE 156* 158*  BUN 68* 29*  CREATININE 3.53* 2.59*  CALCIUM 7.7* 8.2*   Liver Function Tests  Recent Labs  01/04/14 1955 01/07/14 0251  AST 519* 2106*  ALT 427* 3063*  ALKPHOS 82 101  BILITOT 0.6 0.4  PROT 5.6* 5.9*  ALBUMIN 3.1* 3.2*   No results found for this basename: LIPASE, AMYLASE,  in the last 72 hours Cardiac Enzymes  Recent Labs  01/05/14 01/05/14 0750 01/05/14 1055  TROPONINI 4.01* 7.41* 5.52*    Radiology Studies Imaging results have been reviewed and No results found.   ECG SR, widespread ST depression and T wave inversion, chronic  ASSESSMENT AND PLAN She does not  tolerate even current low dose of beta blocker. Known high grade stenoses in LAD and RCA with downstream ischemic myocardium, likely cause of infarction in the setting of acute GI bleed. She also has markedly elevated LFTs suggesting severe systemic hypoperfusion ("shock liver") which also have caused myocardial injury. Medical management options are severely limited by hypotension and bleeding problems. Ranexa could be used, but of doubtful benefit in this angina free patient. Will follow GI recommendations regarding time of ASA reinitiation.   Thurmon Fair, MD, [facility] [phone] office [phone] pager 01/07/2014 11:26 AM

## 2014-01-07 NOTE — Progress Notes (Signed)
Received from 3E to 6E06 in stable condition. Transferred to bed from wheelchair with one assist.  O2 via nasal cannula @ 3 Liters.  Alert and Oriented, MAE without difficulty.  Placed on Telemetry and appears to be in NSR.  Vital signs taken and oriented to room. Jill Ruppe K

## 2014-01-08 LAB — GLUCOSE, CAPILLARY
GLUCOSE-CAPILLARY: 166 mg/dL — AB (ref 70–99)
GLUCOSE-CAPILLARY: 315 mg/dL — AB (ref 70–99)

## 2014-01-08 MED ORDER — MIDODRINE HCL 5 MG PO TABS: ORAL_TABLET | ORAL | Status: DC

## 2014-01-08 MED ORDER — SIMVASTATIN 80 MG PO TABS: 80.0000 mg | ORAL_TABLET | Freq: Every evening | ORAL | Status: AC

## 2014-01-08 NOTE — Discharge Instructions (Signed)
Gastrointestinal Bleeding °Gastrointestinal bleeding is bleeding somewhere along the path that food travels through the body (digestive tract). This path is anywhere between the mouth and the opening of the butt (anus). You may have blood in your throw up (vomit) or in your poop (stools). If there is a lot of bleeding, you may need to stay in the hospital. °HOME CARE °· Only take medicine as told by your doctor. °· Eat foods with fiber such as whole grains, fruits, and vegetables. You can also try eating 1 to 3 prunes a day. °· Drink enough fluids to keep your pee (urine) clear or pale yellow. °GET HELP RIGHT AWAY IF:  °· Your bleeding gets worse. °· You feel dizzy, weak, or you pass out (faint). °· You have bad cramps in your back or belly (abdomen). °· You have large blood clumps (clots) in your poop. °· Your problems are getting worse. °MAKE SURE YOU:  °· Understand these instructions. °· Will watch your condition. °· Will get help right away if you are not doing well or get worse. °Document Released: 07/29/2008 Document Revised: 10/06/2012 Document Reviewed: 09/29/2011 °[employer]® Patient Information ©2014 [employer]. ° °

## 2014-01-08 NOTE — Progress Notes (Signed)
PROGRESS NOTE  Subjective:   Ms. Baez is a 73 yo with hx of ESRD ( on dialysis) , CAD, DVT / PE admitted with weakness and anemia.   She had a NSTEMI -likely due to her profound anemia.  EGD by Dr. Marina Goodell revealed significant gastric AV malformations.  He felt that these were likely the source of her GI bleeding.   He also stated that we could resume aspirin therapy.   Objective:    Vital Signs:   Temp:  [97.3 F (36.3 C)-97.7 F (36.5 C)] 97.5 F (36.4 C) (03/08 0447) Pulse Rate:  [79-86] 79 (03/08 0447) Resp:  [19-29] 24 (03/08 0447) BP: (82-127)/(22-61) 127/50 mmHg (03/08 0447) SpO2:  [92 %-99 %] 97 % (03/08 0447)  Last BM Date: 01/07/14   24-hour weight change: Weight change:   Weight trends: Filed Weights   01/06/14 0715 01/06/14 1123 01/07/14 0429  Weight: 144 lb 6.4 oz (65.5 kg) 137 lb 12.6 oz (62.5 kg) 143 lb 1.3 oz (64.9 kg)    Intake/Output:  03/07 0701 - 03/08 0700 In: 966 [P.O.:960; I.V.:6] Out: 15 [Urine:15]     Physical Exam: BP 127/50  Pulse 79  Temp(Src) 97.5 F (36.4 C) (Oral)  Resp 24  Ht 5\' 5"  (1.651 m)  Wt 143 lb 1.3 oz (64.9 kg)  BMI 23.81 kg/m2  SpO2 97%  Wt Readings from Last 3 Encounters:  01/07/14 143 lb 1.3 oz (64.9 kg)  01/07/14 143 lb 1.3 oz (64.9 kg)  12/30/13 138 lb 0.1 oz (62.6 kg)    General: Vital signs reviewed and noted. Chronically ill appearing  Head: Normocephalic, atraumatic.  Eyes: conjunctivae/corneas clear.  EOM's intact.   Throat: normal  Neck:  normal   Lungs:    clear  Heart:  RR  Abdomen:  Soft, non-tender, non-distended    Extremities: No edema   Neurologic: A&O X3, CN II - XII are grossly intact.   Psych: Normal     Labs: BMET:  Recent Labs  01/06/14 0257 01/07/14 0251  NA 139 140  K 3.7 4.1  CL 99 99  CO2 23 26  GLUCOSE 156* 158*  BUN 68* 29*  CREATININE 3.53* 2.59*  CALCIUM 7.7* 8.2*    Liver function tests:  Recent Labs  01/07/14 0251  AST 2106*  ALT 3063*  ALKPHOS  101  BILITOT 0.4  PROT 5.9*  ALBUMIN 3.2*   No results found for this basename: LIPASE, AMYLASE,  in the last 72 hours  CBC:  Recent Labs  01/06/14 0749 01/07/14 0251  WBC 16.2* 13.5*  HGB 8.4* 8.9*  HCT 25.6* 28.0*  MCV 91.1 94.3  PLT 246 244    Cardiac Enzymes:  Recent Labs  01/05/14 1055  TROPONINI 5.52*    Coagulation Studies: No results found for this basename: LABPROT, INR,  in the last 72 hours  Other: No components found with this basename: POCBNP,  No results found for this basename: DDIMER,  in the last 72 hours No results found for this basename: HGBA1C,  in the last 72 hours No results found for this basename: CHOL, HDL, LDLCALC, TRIG, CHOLHDL,  in the last 72 hours No results found for this basename: TSH, T4TOTAL, FREET3, T3FREE, THYROIDAB,  in the last 72 hours No results found for this basename: VITAMINB12, FOLATE, FERRITIN, TIBC, IRON, RETICCTPCT,  in the last 72 hours   Other results:  EKG:    NSR with deep ST depression / TWI.  Medications:    Infusions:    Scheduled Medications: . albuterol  2.5 mg Nebulization BID  . aspirin EC  81 mg Oral Daily  . atorvastatin  80 mg Oral q1800  . budesonide  0.25 mg Nebulization BID  . calcium acetate  667 mg Oral TID WC  . darbepoetin (ARANESP) injection - DIALYSIS  60 mcg Intravenous Q Fri-HD  . doxercalciferol  2 mcg Intravenous Q M,W,F-HD  . ferric gluconate (FERRLECIT/NULECIT) IV  62.5 mg Intravenous Weekly  . insulin aspart  0-9 Units Subcutaneous TID WC  . levothyroxine  25 mcg Oral QAC breakfast  . midodrine  5 mg Oral q12n4p  . pantoprazole  40 mg Oral Q0600  . sodium chloride  3 mL Intravenous Q12H  . sodium chloride  3 mL Intravenous Q12H  . 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 CAD:  Mrs. Has a known history of coronary artery disease. She had a cardiac catheterization in 2013 with a failed PCI attempt. She's been on medical therapy since that time.  She GI bleed due to AV malformations and as a result had a non-ST segment elevation myocardial infarction.  She has a history of coronary artery disease with cardiac catheterization in 2013: Left main: Heavily calcified vessel with 20% distal stenosis.  Left Anterior Descending Artery: Large caliber vessel that courses the apex. The proximal vessel is heavily calcified. The proximal vessel has diffuse 40% stenosis. There is a discreet 40% stenosis just before the first diagonal branch. The distal LAD has diffuse 99% stenosis at the apex.  Circumflex Artery: Large caliber vessel with very small first and second marginal branches. The second OM branch is 1.0 mm and has a 90% ostial stenosis (too small for PCI). The third OM has minor plaque disease.  Right Coronary Artery: Large dominant vessel. The proximal vessel has a tubular 90% stenosis. The mid vessel has a chronic, sub-total occlusion. The distal vessel fills both from antegrade flow and from left to right collaterals.  Left Ventricular Angiogram: No LV gram  Impression:  1. 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  2. Attempted angioplasty of the mid RCA. 3 balloon inflations but unable to restore adequate flow. Unable to place a stent secondary to vessel calcification and tortuosity.   At this point she remains completely pain-free. She's not had any further episodes of chest discomfort or shortness of breath since she was transfused.  At  this point I would agree with continued medical therapy. I would not plan on doing a cardiac catheterization during this admission unless she has recurrent pain or other symptoms of ischemia.  We will repeat her EKG.  She is on midodrine and apparently has episodes of orthostatic hypotension. I suspect that these are following her dialysis.   If she has episodes of angina, one could consider adding low-dose carvedilol. Unfortunately, that would likely worsen her  episodes of orthostatic hypotension. Since she is pain-free presently, we will continue with her current medications  We will sign off.  Call for questions or is she has any further episodes of CP   She will follow up with Dr. Allyson SabalBerry in the office.     Disposition:  Length of Stay: 4  Vesta MixerPhilip J. Jabier Deese, Montez HagemanJr., MD, [facility] 01/08/2014, 8:10 AM Office [phone] Pager [phone]

## 2014-01-08 NOTE — Progress Notes (Signed)
Subjective:  No complaints, feeling much better. She tells me she is going home today- no more discolored stools, has been eating  Objective: Vital signs in last 24 hours: Temp:  [97.3 F (36.3 C)-97.7 F (36.5 C)] 97.5 F (36.4 C) (03/08 0447) Pulse Rate:  [79-86] 79 (03/08 0447) Resp:  [19-29] 24 (03/08 0447) BP: (82-127)/(22-61) 127/50 mmHg (03/08 0447) SpO2:  [92 %-99 %] 97 % (03/08 0447) Weight change:   Intake/Output from previous day: 03/07 0701 - 03/08 0700 In: 966 [P.O.:960; I.V.:6] Out: 15 [Urine:15] Intake/Output this shift: Total I/O In: 486 [P.O.:480; I.V.:6] Out: 15 [Urine:15]  Lab Results:  Recent Labs  01/06/14 0749 01/07/14 0251  WBC 16.2* 13.5*  HGB 8.4* 8.9*  HCT 25.6* 28.0*  PLT 246 244   BMET:  Recent Labs  01/06/14 0257 01/07/14 0251  NA 139 140  K 3.7 4.1  CL 99 99  CO2 23 26  GLUCOSE 156* 158*  BUN 68* 29*  CREATININE 3.53* 2.59*  CALCIUM 7.7* 8.2*  ALBUMIN  --  3.2*   No results found for this basename: PTH,  in the last 72 hours Iron Studies: No results found for this basename: IRON, TIBC, TRANSFERRIN, FERRITIN,  in the last 72 hours  EXAM:  General appearance: Alert, in no apparent distress  Resp: CTA without rales, rhonchi, or wheezes  Cardio: RRR without murmur or rub  GI: + BS, soft and nontender  Extremities: No edema  Access: L IJ catheter   Dialysis Orders: Dialysis: MWF [facility]  4h 62.5kg 2K/2.5 Bath L IJ Cath 400/1.5 Prof 4 No heparin  Hectorol 2 No epo Venofer 50/wk  Assessment/Plan: 1. GI Bleeding - likely sec to gastric AVM per EGD, s/p APC probe therapy per GI, on IV Protonix.  1. Elevated troponin - seen by Cardiology, most likely demand ischemia in setting of severe anemia. 2. ESRD - HD on MWF via PC @ [facility], K 4.1.  Next HD tomorrow. If goes home will be in [place] 3. Hypotension/Volume - BP 127/50 on Midodrine; wt 64.9 kg yesterday. Should not need change to EDW 4. Anemia - Hgb 6 on 3/2, up to 8.9 s/p  transfusion, Aranesp 60 mcg today, weekly Fe. 5. Sec HPT - Ca 8.2 (8.8 corrected); Hectorol 4 mcg, Phoslo  6. Nutrition - Alb 3.2, renal diet & vitamin.     LOS: 4 days   LYLES,CHARLES 01/08/2014,6:58 AM  Patient seen and examined, agree with above note with above modifications. No complaints.  No further bleeding- eating.  She tells me that she will be going home today.  We can continue to watch hgb as OP  Annie Sable, MD 01/08/2014

## 2014-01-08 NOTE — Discharge Summary (Signed)
Physician Discharge Summary  ZENAYA GROWNEY LYY:[id number] DOB: 07/13/1941 DOA: 01/04/2014  PCP: Quentin Mulling, MD  Admit date: 01/04/2014 Discharge date: 01/08/2014  Time spent: 35 minutes  Recommendations for Outpatient Follow-up:  1. Medication change: Patient's Imdur was being discontinued for concerns of hypotension 2. Medication change: Patient is normally on Midodrine 5mg  po bid.  Her dose on dialysis days increases to 10 mg bid. 3. Patient will follow up with her primary care physician next one month 4. 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 heart failure)   Diabetes mellitus   Hydronephrosis of left kidney   ESRD on dialysis   GI bleed   Acute blood loss anemia   Hypotension   Melena   Elevated troponin   Abnormal EKG   Angiodysplasia of stomach   Discharge Condition: Stable, being discharged home  Diet recommendation: Heart healthy carb modified  Filed Weights   01/06/14 0715 01/06/14 1123 01/07/14 0429  Weight: 65.5 kg (144 lb 6.4 oz) 62.5 kg (137 lb 12.6 oz) 64.9 kg (143 lb 1.3 oz)    History of present illness:  73 year old female patient history of chronic kidney disease on dialysis, CAD followed by cardiologist in [facility], prior history of DVT and PE and diabetes. During dialysis date of admission patient complained of weakness. Subsequent hemoglobin check revealed a hemoglobin of 6. Her stool was occult blood positive and noted to be melanotic in appearance. She was transferred to [facility] and upon presentation there was hypotensive. She was given a liter of normal saline and was transfused one unit of packed red blood cells and subsequently transferred to [facility] on 3/4 in the for further treatment. She has been taking an 1 mg aspirin for many years. No other NSAIDs. No associated chest pain or shortness of breath, no abdominal pain or diarrhea. After arrival to [place] she had an episode of nausea and vomiting which was bloody.  Hospital Course:  Active Problems:   Hypothyroidism: Stable medical issue continued on Synthroid    HTN (hypertension): Patient's Imdur were discontinued secondary to significant hypotension episodes.    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  -continue volume control with HD     Diabetes mellitus:   Patient will continue home dose of long-term acting insulin plus a short-term at meals    ESRD on dialysis: Stable. Managed by nephrology. See below in regards to hypotension.    GI bleed: Felt to be consistent with an upper GI bleed. Patient was evaluated by gastroenterology and took patient for a endoscopy and APC on 3/6 noting a gastric AV mouth formation. Aspirin was felt to be safe to restart and diet was advanced. Patient had received 1 unit packed red blood cells prior to transfer and then again after arrival to: On 3/6: Hemoglobin on day prior to discharge was 8.9.    Acute blood loss anemia: Secondary to GI bleed. See above. Hemoglobin on day prior to discharge was 8.9.  Transaminitis: Patient had followup hypotension after dialysis again on 3/6. Her transaminases jumped up with me AST of 2106 and ALT of 3063.  This is felt to be secondary to shock liver. To try to minimize hypotension, increasing her midodrine on days of dialysis from 5 mg to 10 mg twice a day    Hypotension: This is actually a chronic problem. Initially it resolved after  restoration of blood volume. It subsequently related to her hemodialysis. This greatly limits the effectiveness and ability of using antihypertensive medications including Coreg for her heart disease and heart failure. See above. Midodrine increased on days of dialysis to limit this.    Melena: Actually upper GI bleed. See above.    Elevated troponin/non-ST elevated MI: Patient's troponins peaked as high as 7.41. EKG was unremarkable an ounces of ST segment downsloping depression in inferior leads. Suspected non-ST elevated MI in setting of demand ischemia from hypotension and blood loss anemia. Patient was seen and evaluated by cardiology and she has known high-grade stenoses in the LAD and RCA with downstream ischemic myocardium. The acute infarction was in the setting of acute GI bleed. Coreg was briefly started attempt to medically manage this, however it was discontinued to 2 significant hypotension. Ranexa was felt not to be beneficial in this angina free patient. Statin was increased and aspirin will be continued. To decrease further hypotension, midodrine dose increased on dialysis days. See above.  At this time, cardiac catheterization not planned    Abnormal EKG: See above    Angiodysplasia of stomach: Underlying cause of GI bleeding. See above.   Procedures: EGD done 3/6: Noting significant gastric AV malformation  Consultations:  [facility] gastro[facility]ology  [facility] cardiology.  Nephrology    Discharge Exam: Filed Vitals:   01/08/14 0931  BP: 124/65  Pulse: 80  Temp: 98 F (36.7 C)  Resp: 22    General: Alert and oriented x3, no acute distress Cardiovascular: Regular rate and rhythm, S1-S2, 2/6 systolic ejection murmur Respiratory: Clear to auscultation bilaterally  Discharge Instructions  Discharge Orders   Future Orders Complete By Expires   Diet - low sodium heart healthy  As directed    Increase activity slowly  As directed        Medication List    STOP  taking these medications       isosorbide mononitrate 120 MG 24 hr tablet  Commonly known as:  IMDUR      TAKE these medications       albuterol (5 MG/ML) 0.5% nebulizer solution  Commonly known as:  PROVENTIL  Take 2.5 mg by nebulization 2 (two) times daily.     aspirin EC 81 MG tablet  Take 81 mg by mouth daily.     calcium acetate 667 MG capsule  Commonly known as:  PHOSLO  Take 667 mg by mouth 3 (three) times daily with meals.     guaiFENesin 600 MG 12 hr tablet  Commonly known as:  MUCINEX  Take 1 tablet (600 mg total) by mouth 2 (two) times daily as needed for cough.     insulin aspart 100 UNIT/ML injection  Commonly known as:  novoLOG  Inject 2 Units into the skin 3 (three) times daily with meals.     insulin glargine 100 UNIT/ML injection  Commonly known as:  LANTUS  Inject 0.17 mLs (17 Units total) into the skin at bedtime.     levothyroxine 25 MCG tablet  Commonly known as:  SYNTHROID, LEVOTHROID  Take 25 mcg  by mouth daily.     midodrine 5 MG tablet  Commonly known as:  PROAMATINE  Take 5 mg bid on non-dialysis days.  Take 10 mg bid on dialysis days.     multivitamin Tabs tablet  Take 1 tablet by mouth at bedtime.     nitroGLYCERIN 0.4 MG SL tablet  Commonly known as:  NITROSTAT  Place 0.4 mg under the tongue every 5 (five) minutes as needed. For chest pain.     NOVOLIN R RELION 100 units/mL injection  Generic drug:  insulin regular  Inject 6-10 Units into the skin 2 (two) times daily. If cbg 300 or 400 use 10 units, if cbg 200 use 6 unit, if cbg 160 use 2 units. Sliding scale     pantoprazole 40 MG tablet  Commonly known as:  PROTONIX  Take 40 mg by mouth daily.     PROBIOTIC PO  Take 1 capsule by mouth daily as needed (digestion).     simvastatin 80 MG tablet  Commonly known as:  ZOCOR  Take 1 tablet (80 mg total) by mouth every evening.     tiotropium 18 MCG inhalation capsule  Commonly known as:  SPIRIVA  Place 1 capsule (18 mcg total)  into inhaler and inhale daily.       Allergies  Allergen Reactions  . Procaine Hcl Nausea And Vomiting and Other (See Comments)    [facility] has reaction of "comatose"       Follow-up Information   Follow up with HOOPER,[name], MD In 1 month.   Specialty:  Geriatric Medicine   Contact information:   [address] [place] [address] [phone]        The results of significant diagnostics from this hospitalization (including imaging, microbiology, ancillary and laboratory) are listed below for reference.    Significant Diagnostic Studies: Dg Chest Port 1 View  01/05/2014   CLINICAL DATA:  Short of breath  EXAM: PORTABLE CHEST - 1 VIEW  COMPARISON:  01/04/2014  FINDINGS: Central venous catheter tip in the SVC is unchanged.  Mild vascular congestion has developed since the prior study reflecting mild fluid overload.  Mild left lower lobe atelectasis has developed. No significant effusion.  IMPRESSION: Interval development of mild vascular congestion and left lower lobe atelectasis.   Electronically Signed   By: Marlan Palau M.D.   On: 01/05/2014 07:17   Dg Chest Port 1 View  12/28/2013   CLINICAL DATA:  Postop cyst.  Follow-up edema.  EXAM: PORTABLE CHEST - 1 VIEW  COMPARISON:  12/26/2013  FINDINGS: Allowing for differences in patient positioning and technique, there has been no significant change from the prior study.  Moderate left and small right pleural effusions. Associated basilar airspace opacity is likely atelectasis. There are prominent bronchovascular markings without overt pulmonary edema.  Cardiac silhouette is normal in size. Mediastinum is normal in contour.  Left internal jugular dual-lumen central venous catheter has its distal tip at the caval atrial junction, stable.  No pneumothorax.  IMPRESSION: 1. No significant change from the prior study. 2. Moderate left and small right pleural effusions associated with lung base opacity most likely  atelectasis. 3. No overt pulmonary edema.   Electronically Signed   By: Amie Portland M.D.   On: 12/28/2013 20:01   Dg Chest Port 1 View  12/26/2013   CLINICAL DATA:  Shortness of breath.  EXAM: PORTABLE CHEST - 1 VIEW  COMPARISON:  Chest radiograph performed 12/07/2013  FINDINGS: The lungs are  well-aerated. Small to moderate bilateral pleural effusions are seen, with bibasilar airspace opacification. Underlying vascular congestion is noted. Findings are concerning for pulmonary edema. No pneumothorax is identified.  The cardiomediastinal silhouette is borderline normal in size. A left-sided dual lumen catheter is seen ending about the distal SVC. No acute osseous abnormalities are seen.  IMPRESSION: Small to moderate bilateral pleural effusions, with bibasilar airspace opacification. Underlying vascular congestion noted. Findings concerning for pulmonary edema.   Electronically Signed   By: Roanna Raider M.D.   On: 12/26/2013 04:09    Microbiology: Recent Results (from the past 240 hour(s))  MRSA PCR SCREENING     Status: None   Collection Time    01/04/14  6:43 PM      Result Value Ref Range Status   MRSA by PCR NEGATIVE  NEGATIVE Final   Comment:            The GeneXpert MRSA Assay (FDA     approved for NASAL specimens     only), is one component of a     comprehensive MRSA colonization     surveillance program. It is not     intended to diagnose MRSA     infection nor to guide or     monitor treatment for     MRSA infections.     Labs: Basic Metabolic Panel:  Recent Labs Lab 01/04/14 1955 01/05/14 0750 01/06/14 0257 01/07/14 0251  NA 140 141 139 140  K 4.8 4.7 3.7 4.1  CL 98 99 99 99  CO2 23 23 23 26   GLUCOSE 211* 250* 156* 158*  BUN 36* 52* 68* 29*  CREATININE 1.82* 2.80* 3.53* 2.59*  CALCIUM 7.9* 7.6* 7.7* 8.2*   Liver Function Tests:  Recent Labs Lab 01/04/14 1955 01/07/14 0251  AST 519* 2106*  ALT 427* 3063*  ALKPHOS 82 101  BILITOT 0.6 0.4  PROT 5.6*  5.9*  ALBUMIN 3.1* 3.2*   No results found for this basename: LIPASE, AMYLASE,  in the last 168 hours No results found for this basename: AMMONIA,  in the last 168 hours CBC:  Recent Labs Lab 01/04/14 1955 01/05/14 0750 01/05/14 2003 01/06/14 0749 01/07/14 0251  WBC 17.1* 15.8* 15.9* 16.2* 13.5*  HGB 7.4* 8.7* 8.9* 8.4* 8.9*  HCT 22.6* 26.4* 26.6* 25.6* 28.0*  MCV 91.5 89.8 89.9 91.1 94.3  PLT 189 219 236 246 244   Cardiac Enzymes:  Recent Labs Lab 01/04/14 1955 01/05/14 01/05/14 0750 01/05/14 1055  TROPONINI 2.89* 4.01* 7.41* 5.52*   CBG:  Recent Labs Lab 01/07/14 0739 01/07/14 1213 01/07/14 1605 01/07/14 2126 01/08/14 0648  GLUCAP 168* 221* 237* 196* 166*       Signed:  Sonjia Wilcoxson K  [facility] 01/08/2014, 11:25 AM

## 2014-01-09 ENCOUNTER — Encounter (HOSPITAL_COMMUNITY): Payer: Self-pay | Admitting: Internal Medicine

## 2014-01-13 NOTE — Anesthesia Postprocedure Evaluation (Signed)
  Anesthesia Post-op Note  Patient: [name] [name]  Procedure(s) Performed: Procedure(s): ESOPHAGOGASTRODUODENOSCOPY (EGD) (N/A)  Patient Location: PACU  Anesthesia Type:MAC  Level of Consciousness: awake  Airway and Oxygen Therapy: Patient Spontanous Breathing  Post-op Pain: none  Post-op Assessment: Post-op Vital signs reviewed  Post-op Vital Signs: stable  Complications: No apparent anesthesia complications

## 2014-02-24 ENCOUNTER — Emergency Department (HOSPITAL_COMMUNITY): Payer: Medicare Other

## 2014-02-24 ENCOUNTER — Encounter (HOSPITAL_COMMUNITY): Payer: Self-pay | Admitting: Emergency Medicine

## 2014-02-24 ENCOUNTER — Inpatient Hospital Stay (HOSPITAL_COMMUNITY)
Admission: EM | Admit: 2014-02-24 | Discharge: 2014-03-01 | DRG: 189 | Disposition: A | Discharge status: Home Health | Payer: Medicare Other | Attending: Internal Medicine | Admitting: Internal Medicine

## 2014-02-24 ENCOUNTER — Inpatient Hospital Stay (HOSPITAL_COMMUNITY): Payer: Medicare Other

## 2014-02-24 DIAGNOSIS — J9620 Acute and chronic respiratory failure, unspecified whether with hypoxia or hypercapnia: Principal | ICD-10-CM | POA: Diagnosis present

## 2014-02-24 DIAGNOSIS — Z87891 Personal history of nicotine dependence: Secondary | ICD-10-CM

## 2014-02-24 DIAGNOSIS — Z833 Family history of diabetes mellitus: Secondary | ICD-10-CM

## 2014-02-24 DIAGNOSIS — R778 Other specified abnormalities of plasma proteins: Secondary | ICD-10-CM

## 2014-02-24 DIAGNOSIS — N186 End stage renal disease: Secondary | ICD-10-CM | POA: Diagnosis present

## 2014-02-24 DIAGNOSIS — E785 Hyperlipidemia, unspecified: Secondary | ICD-10-CM | POA: Diagnosis present

## 2014-02-24 DIAGNOSIS — D638 Anemia in other chronic diseases classified elsewhere: Secondary | ICD-10-CM | POA: Diagnosis present

## 2014-02-24 DIAGNOSIS — G9340 Encephalopathy, unspecified: Secondary | ICD-10-CM | POA: Diagnosis present

## 2014-02-24 DIAGNOSIS — I5042 Chronic combined systolic (congestive) and diastolic (congestive) heart failure: Secondary | ICD-10-CM

## 2014-02-24 DIAGNOSIS — I2510 Atherosclerotic heart disease of native coronary artery without angina pectoris: Secondary | ICD-10-CM | POA: Diagnosis present

## 2014-02-24 DIAGNOSIS — Z8774 Personal history of (corrected) congenital malformations of heart and circulatory system: Secondary | ICD-10-CM

## 2014-02-24 DIAGNOSIS — I509 Heart failure, unspecified: Secondary | ICD-10-CM | POA: Diagnosis present

## 2014-02-24 DIAGNOSIS — I5020 Unspecified systolic (congestive) heart failure: Secondary | ICD-10-CM

## 2014-02-24 DIAGNOSIS — K219 Gastro-esophageal reflux disease without esophagitis: Secondary | ICD-10-CM | POA: Diagnosis present

## 2014-02-24 DIAGNOSIS — L97409 Non-pressure chronic ulcer of unspecified heel and midfoot with unspecified severity: Secondary | ICD-10-CM

## 2014-02-24 DIAGNOSIS — J9600 Acute respiratory failure, unspecified whether with hypoxia or hypercapnia: Secondary | ICD-10-CM

## 2014-02-24 DIAGNOSIS — E872 Acidosis, unspecified: Secondary | ICD-10-CM | POA: Diagnosis present

## 2014-02-24 DIAGNOSIS — E1129 Type 2 diabetes mellitus with other diabetic kidney complication: Secondary | ICD-10-CM | POA: Diagnosis present

## 2014-02-24 DIAGNOSIS — E2749 Other adrenocortical insufficiency: Secondary | ICD-10-CM | POA: Diagnosis present

## 2014-02-24 DIAGNOSIS — I739 Peripheral vascular disease, unspecified: Secondary | ICD-10-CM | POA: Diagnosis present

## 2014-02-24 DIAGNOSIS — E119 Type 2 diabetes mellitus without complications: Secondary | ICD-10-CM

## 2014-02-24 DIAGNOSIS — I5043 Acute on chronic combined systolic (congestive) and diastolic (congestive) heart failure: Secondary | ICD-10-CM | POA: Diagnosis present

## 2014-02-24 DIAGNOSIS — E039 Hypothyroidism, unspecified: Secondary | ICD-10-CM | POA: Diagnosis present

## 2014-02-24 DIAGNOSIS — I469 Cardiac arrest, cause unspecified: Secondary | ICD-10-CM

## 2014-02-24 DIAGNOSIS — B351 Tinea unguium: Secondary | ICD-10-CM

## 2014-02-24 DIAGNOSIS — I252 Old myocardial infarction: Secondary | ICD-10-CM

## 2014-02-24 DIAGNOSIS — Z794 Long term (current) use of insulin: Secondary | ICD-10-CM

## 2014-02-24 DIAGNOSIS — I498 Other specified cardiac arrhythmias: Secondary | ICD-10-CM | POA: Diagnosis present

## 2014-02-24 DIAGNOSIS — J449 Chronic obstructive pulmonary disease, unspecified: Secondary | ICD-10-CM | POA: Diagnosis present

## 2014-02-24 DIAGNOSIS — Z7982 Long term (current) use of aspirin: Secondary | ICD-10-CM

## 2014-02-24 DIAGNOSIS — K921 Melena: Secondary | ICD-10-CM

## 2014-02-24 DIAGNOSIS — I9589 Other hypotension: Secondary | ICD-10-CM | POA: Diagnosis present

## 2014-02-24 DIAGNOSIS — E662 Morbid (severe) obesity with alveolar hypoventilation: Secondary | ICD-10-CM | POA: Diagnosis present

## 2014-02-24 DIAGNOSIS — E7800 Pure hypercholesterolemia, unspecified: Secondary | ICD-10-CM

## 2014-02-24 DIAGNOSIS — S98139A Complete traumatic amputation of one unspecified lesser toe, initial encounter: Secondary | ICD-10-CM

## 2014-02-24 DIAGNOSIS — J4489 Other specified chronic obstructive pulmonary disease: Secondary | ICD-10-CM | POA: Diagnosis present

## 2014-02-24 DIAGNOSIS — I10 Essential (primary) hypertension: Secondary | ICD-10-CM

## 2014-02-24 DIAGNOSIS — Z9981 Dependence on supplemental oxygen: Secondary | ICD-10-CM

## 2014-02-24 DIAGNOSIS — Z86718 Personal history of other venous thrombosis and embolism: Secondary | ICD-10-CM

## 2014-02-24 DIAGNOSIS — Z992 Dependence on renal dialysis: Secondary | ICD-10-CM

## 2014-02-24 DIAGNOSIS — N1330 Unspecified hydronephrosis: Secondary | ICD-10-CM

## 2014-02-24 DIAGNOSIS — E1169 Type 2 diabetes mellitus with other specified complication: Secondary | ICD-10-CM | POA: Diagnosis present

## 2014-02-24 DIAGNOSIS — R9431 Abnormal electrocardiogram [ECG] [EKG]: Secondary | ICD-10-CM

## 2014-02-24 DIAGNOSIS — I120 Hypertensive chronic kidney disease with stage 5 chronic kidney disease or end stage renal disease: Secondary | ICD-10-CM | POA: Diagnosis present

## 2014-02-24 DIAGNOSIS — Z86711 Personal history of pulmonary embolism: Secondary | ICD-10-CM

## 2014-02-24 DIAGNOSIS — K31819 Angiodysplasia of stomach and duodenum without bleeding: Secondary | ICD-10-CM

## 2014-02-24 DIAGNOSIS — R7989 Other specified abnormal findings of blood chemistry: Secondary | ICD-10-CM

## 2014-02-24 DIAGNOSIS — K922 Gastrointestinal hemorrhage, unspecified: Secondary | ICD-10-CM

## 2014-02-24 DIAGNOSIS — I959 Hypotension, unspecified: Secondary | ICD-10-CM | POA: Diagnosis present

## 2014-02-24 DIAGNOSIS — D62 Acute posthemorrhagic anemia: Secondary | ICD-10-CM

## 2014-02-24 DIAGNOSIS — J189 Pneumonia, unspecified organism: Secondary | ICD-10-CM | POA: Diagnosis present

## 2014-02-24 DIAGNOSIS — Z79899 Other long term (current) drug therapy: Secondary | ICD-10-CM

## 2014-02-24 HISTORY — DX: Dependence on renal dialysis: Z99.2

## 2014-02-24 HISTORY — DX: Pure hypercholesterolemia, unspecified: E78.00

## 2014-02-24 HISTORY — DX: Dependence on renal dialysis: N18.6

## 2014-02-24 LAB — I-STAT ARTERIAL BLOOD GAS, ED
Acid-base deficit: 4 mmol/L — ABNORMAL HIGH (ref 0.0–2.0)
Acid-base deficit: 2 mmol/L (ref 0.0–2.0)
BICARBONATE: 26 meq/L — AB (ref 20.0–24.0)
Bicarbonate: 27.1 mEq/L — ABNORMAL HIGH (ref 20.0–24.0)
O2 SAT: 89 %
O2 Saturation: 89 %
PCO2 ART: 66.5 mmHg — AB (ref 35.0–45.0)
PO2 ART: 71 mmHg — AB (ref 80.0–100.0)
TCO2: 28 mmol/L (ref 0–100)
TCO2: 29 mmol/L (ref 0–100)
pCO2 arterial: 66.8 mmHg (ref 35.0–45.0)
pH, Arterial: 7.198 — CL (ref 7.350–7.450)
pH, Arterial: 7.219 — ABNORMAL LOW (ref 7.350–7.450)
pO2, Arterial: 70 mmHg — ABNORMAL LOW (ref 80.0–100.0)

## 2014-02-24 LAB — CBC WITH DIFFERENTIAL/PLATELET
BASOS ABS: 0.1 10*3/uL (ref 0.0–0.1)
Basophils Relative: 1 % (ref 0–1)
Eosinophils Absolute: 0.1 10*3/uL (ref 0.0–0.7)
Eosinophils Relative: 1 % (ref 0–5)
HEMATOCRIT: 33.4 % — AB (ref 36.0–46.0)
HEMOGLOBIN: 10.3 g/dL — AB (ref 12.0–15.0)
LYMPHS PCT: 9 % — AB (ref 12–46)
Lymphs Abs: 1.1 10*3/uL (ref 0.7–4.0)
MCH: 30.2 pg (ref 26.0–34.0)
MCHC: 30.8 g/dL (ref 30.0–36.0)
MCV: 97.9 fL (ref 78.0–100.0)
MONO ABS: 0.6 10*3/uL (ref 0.1–1.0)
Monocytes Relative: 5 % (ref 3–12)
NEUTROS PCT: 84 % — AB (ref 43–77)
Neutro Abs: 9.6 10*3/uL — ABNORMAL HIGH (ref 1.7–7.7)
Platelets: 149 10*3/uL — ABNORMAL LOW (ref 150–400)
RBC: 3.41 MIL/uL — ABNORMAL LOW (ref 3.87–5.11)
RDW: 16.5 % — ABNORMAL HIGH (ref 11.5–15.5)
WBC: 11.4 10*3/uL — AB (ref 4.0–10.5)

## 2014-02-24 LAB — COMPREHENSIVE METABOLIC PANEL
ALBUMIN: 2 g/dL — AB (ref 3.5–5.2)
ALT: 25 U/L (ref 0–35)
AST: 44 U/L — ABNORMAL HIGH (ref 0–37)
Alkaline Phosphatase: 61 U/L (ref 39–117)
BUN: 23 mg/dL (ref 6–23)
CHLORIDE: 111 meq/L (ref 96–112)
CO2: 16 meq/L — AB (ref 19–32)
CREATININE: 1.96 mg/dL — AB (ref 0.50–1.10)
Calcium: 5.1 mg/dL — CL (ref 8.4–10.5)
GFR calc Af Amer: 29 mL/min — ABNORMAL LOW (ref >90)
GFR calc non Af Amer: 25 mL/min — ABNORMAL LOW (ref >90)
Glucose, Bld: 186 mg/dL — ABNORMAL HIGH (ref 70–99)
POTASSIUM: 3.2 meq/L — AB (ref 3.7–5.3)
Sodium: 141 mEq/L (ref 137–147)
Total Protein: 3.9 g/dL — ABNORMAL LOW (ref 6.0–8.3)

## 2014-02-24 LAB — BLOOD GAS, ARTERIAL
Acid-base deficit: 0.4 mmol/L (ref 0.0–2.0)
BICARBONATE: 25.7 meq/L — AB (ref 20.0–24.0)
Delivery systems: POSITIVE
Drawn by: 249101
Expiratory PAP: 5
FIO2: 0.6 %
Inspiratory PAP: 10
O2 Saturation: 90.9 %
PH ART: 7.266 — AB (ref 7.350–7.450)
PO2 ART: 65.7 mmHg — AB (ref 80.0–100.0)
Patient temperature: 98.6
TCO2: 27.5 mmol/L (ref 0–100)
pCO2 arterial: 58.3 mmHg (ref 35.0–45.0)

## 2014-02-24 LAB — URINALYSIS, ROUTINE W REFLEX MICROSCOPIC
Bilirubin Urine: NEGATIVE
Glucose, UA: 250 mg/dL — AB
KETONES UR: NEGATIVE mg/dL
LEUKOCYTES UA: NEGATIVE
Nitrite: NEGATIVE
PROTEIN: 100 mg/dL — AB
Specific Gravity, Urine: 1.02 (ref 1.005–1.030)
Urobilinogen, UA: 0.2 mg/dL (ref 0.0–1.0)
pH: 5.5 (ref 5.0–8.0)

## 2014-02-24 LAB — MRSA PCR SCREENING: MRSA by PCR: NEGATIVE

## 2014-02-24 LAB — URINE MICROSCOPIC-ADD ON

## 2014-02-24 LAB — BASIC METABOLIC PANEL
BUN: 6 mg/dL (ref 6–23)
CO2: 27 mEq/L (ref 19–32)
Calcium: 8.6 mg/dL (ref 8.4–10.5)
Chloride: 98 mEq/L (ref 96–112)
Creatinine, Ser: 0.95 mg/dL (ref 0.50–1.10)
GFR, EST AFRICAN AMERICAN: 69 mL/min — AB (ref >90)
GFR, EST NON AFRICAN AMERICAN: 59 mL/min — AB (ref >90)
Glucose, Bld: 104 mg/dL — ABNORMAL HIGH (ref 70–99)
Potassium: 4.1 mEq/L (ref 3.7–5.3)
SODIUM: 142 meq/L (ref 137–147)

## 2014-02-24 LAB — GLUCOSE, CAPILLARY
Glucose-Capillary: 190 mg/dL — ABNORMAL HIGH (ref 70–99)
Glucose-Capillary: 111 mg/dL — ABNORMAL HIGH (ref 70–99)
Glucose-Capillary: 158 mg/dL — ABNORMAL HIGH (ref 70–99)

## 2014-02-24 LAB — TROPONIN I
Troponin I: <0.3 ng/mL (ref <0.30)
Troponin I: <0.3 ng/mL (ref <0.30)
Troponin I: <0.3 ng/mL (ref <0.30)

## 2014-02-24 LAB — PRO B NATRIURETIC PEPTIDE: Pro B Natriuretic peptide (BNP): 32267 pg/mL — ABNORMAL HIGH (ref 0–125)

## 2014-02-24 LAB — CORTISOL: Cortisol, Plasma: 24.5 ug/dL

## 2014-02-24 LAB — I-STAT CG4 LACTIC ACID, ED: Lactic Acid, Venous: 1.88 mmol/L (ref 0.5–2.2)

## 2014-02-24 LAB — LACTIC ACID, PLASMA
LACTIC ACID, VENOUS: 1 mmol/L (ref 0.5–2.2)
LACTIC ACID, VENOUS: 0.5 mmol/L (ref 0.5–2.2)
Lactic Acid, Venous: 1.5 mmol/L (ref 0.5–2.2)

## 2014-02-24 LAB — PHOSPHORUS
Phosphorus: 4.5 mg/dL (ref 2.3–4.6)
Phosphorus: 1 mg/dL — CL (ref 2.3–4.6)

## 2014-02-24 LAB — TSH: TSH: 2.87 u[IU]/mL (ref 0.350–4.500)

## 2014-02-24 LAB — PROTIME-INR
INR: 0.96 (ref 0.00–1.49)
Prothrombin Time: 12.6 seconds (ref 11.6–15.2)

## 2014-02-24 LAB — MAGNESIUM
Magnesium: 1.8 mg/dL (ref 1.5–2.5)
Magnesium: 1.8 mg/dL (ref 1.5–2.5)

## 2014-02-24 LAB — STREP PNEUMONIAE URINARY ANTIGEN: Strep Pneumo Urinary Antigen: NEGATIVE

## 2014-02-24 LAB — APTT: aPTT: 30 seconds (ref 24–37)

## 2014-02-24 LAB — LIPASE, BLOOD: Lipase: 23 U/L (ref 11–59)

## 2014-02-24 LAB — PROCALCITONIN

## 2014-02-24 MED ORDER — SODIUM CHLORIDE 0.9 % IV SOLN
100.0000 mL | INTRAVENOUS | Status: DC | PRN
Start: 2014-02-24 — End: 2014-02-25

## 2014-02-24 MED ORDER — DEXTROSE 5 % IV SOLN
2.0000 g | Freq: Once | INTRAVENOUS | Status: AC
  Administered 2014-02-24: 2 g via INTRAVENOUS
  Filled 2014-02-24: qty 2

## 2014-02-24 MED ORDER — HEPARIN SODIUM (PORCINE) 1000 UNIT/ML IJ SOLN
2.2000 mL | Freq: Once | INTRAMUSCULAR | Status: AC
  Administered 2014-02-24: 1000 [IU] via INTRAVENOUS

## 2014-02-24 MED ORDER — SODIUM CHLORIDE 0.9 % IV SOLN: 100.0000 mL | INTRAVENOUS | Status: DC | PRN

## 2014-02-24 MED ORDER — IPRATROPIUM-ALBUTEROL 0.5-2.5 (3) MG/3ML IN SOLN
3.0000 mL | Freq: Four times a day (QID) | RESPIRATORY_TRACT | Status: DC
  Administered 2014-02-25 – 2014-03-01 (×16): 3 mL via RESPIRATORY_TRACT
  Filled 2014-02-24 (×16): qty 3

## 2014-02-24 MED ORDER — PANTOPRAZOLE SODIUM 40 MG PO TBEC
40.0000 mg | DELAYED_RELEASE_TABLET | Freq: Every day | ORAL | Status: DC
  Administered 2014-02-25 – 2014-03-01 (×5): 40 mg via ORAL
  Filled 2014-02-24 (×4): qty 1

## 2014-02-24 MED ORDER — PENTAFLUOROPROP-TETRAFLUOROETH EX AERO: 1.0000 "application " | INHALATION_SPRAY | CUTANEOUS | Status: DC | PRN

## 2014-02-24 MED ORDER — SODIUM CHLORIDE 0.9 % IV SOLN
INTRAVENOUS | Status: DC
Start: 2014-02-24 — End: 2014-02-24

## 2014-02-24 MED ORDER — NEPRO/CARBSTEADY PO LIQD
237.0000 mL | ORAL | Status: DC | PRN
Start: 2014-02-24 — End: 2014-02-25

## 2014-02-24 MED ORDER — SODIUM CHLORIDE 0.9 % IV SOLN
1.0000 g | Freq: Once | INTRAVENOUS | Status: AC
  Administered 2014-02-24: 1 g via INTRAVENOUS
  Filled 2014-02-24: qty 10

## 2014-02-24 MED ORDER — CALCIUM ACETATE 667 MG PO CAPS
667.0000 mg | ORAL_CAPSULE | Freq: Three times a day (TID) | ORAL | Status: DC
  Administered 2014-02-24 – 2014-03-01 (×14): 667 mg via ORAL
  Filled 2014-02-24 (×18): qty 1

## 2014-02-24 MED ORDER — INSULIN GLARGINE 100 UNIT/ML ~~LOC~~ SOLN
10.0000 [IU] | Freq: Every day | SUBCUTANEOUS | Status: DC
  Administered 2014-02-24 – 2014-02-28 (×5): 10 [IU] via SUBCUTANEOUS
  Filled 2014-02-24 (×7): qty 0.1

## 2014-02-24 MED ORDER — SODIUM CHLORIDE 0.9 % IV SOLN
1.0000 g | Freq: Once | INTRAVENOUS | Status: AC
  Administered 2014-02-24: 1 g via INTRAVENOUS
  Filled 2014-02-24 (×2): qty 10

## 2014-02-24 MED ORDER — SIMVASTATIN 80 MG PO TABS
80.0000 mg | ORAL_TABLET | Freq: Every evening | ORAL | Status: DC
  Administered 2014-02-24 – 2014-03-01 (×6): 80 mg via ORAL
  Filled 2014-02-24 (×6): qty 1

## 2014-02-24 MED ORDER — TIOTROPIUM BROMIDE MONOHYDRATE 18 MCG IN CAPS
18.0000 ug | ORAL_CAPSULE | Freq: Every day | RESPIRATORY_TRACT | Status: DC
  Filled 2014-02-24: qty 5

## 2014-02-24 MED ORDER — HEPARIN SODIUM (PORCINE) 1000 UNIT/ML DIALYSIS: 1000.0000 [IU] | INTRAMUSCULAR | Status: DC | PRN

## 2014-02-24 MED ORDER — LEVOTHYROXINE SODIUM 25 MCG PO TABS
25.0000 ug | ORAL_TABLET | Freq: Every day | ORAL | Status: DC
  Administered 2014-02-25 – 2014-03-01 (×5): 25 ug via ORAL
  Filled 2014-02-24 (×6): qty 1

## 2014-02-24 MED ORDER — ASPIRIN EC 81 MG PO TBEC
81.0000 mg | DELAYED_RELEASE_TABLET | Freq: Every day | ORAL | Status: DC
  Administered 2014-02-25 – 2014-03-01 (×5): 81 mg via ORAL
  Filled 2014-02-24 (×6): qty 1

## 2014-02-24 MED ORDER — SODIUM CHLORIDE 0.9 % IV SOLN: 250.0000 mL | INTRAVENOUS | Status: DC | PRN

## 2014-02-24 MED ORDER — SODIUM CHLORIDE 0.9 % IV SOLN
INTRAVENOUS | Status: DC
  Administered 2014-02-24: 12:00:00 via INTRAVENOUS

## 2014-02-24 MED ORDER — HEPARIN SODIUM (PORCINE) 5000 UNIT/ML IJ SOLN
5000.0000 [IU] | Freq: Three times a day (TID) | INTRAMUSCULAR | Status: DC
  Administered 2014-02-24 – 2014-03-01 (×13): 5000 [IU] via SUBCUTANEOUS
  Filled 2014-02-24 (×18): qty 1

## 2014-02-24 MED ORDER — LIDOCAINE HCL (PF) 1 % IJ SOLN: 5.0000 mL | INTRAMUSCULAR | Status: DC | PRN

## 2014-02-24 MED ORDER — COLLAGENASE 250 UNIT/GM EX OINT
TOPICAL_OINTMENT | Freq: Every day | CUTANEOUS | Status: DC
  Administered 2014-02-24 – 2014-03-01 (×4): via TOPICAL
  Filled 2014-02-24 (×2): qty 30

## 2014-02-24 MED ORDER — VANCOMYCIN HCL 10 G IV SOLR
1500.0000 mg | Freq: Once | INTRAVENOUS | Status: AC
  Administered 2014-02-24: 1500 mg via INTRAVENOUS
  Filled 2014-02-24: qty 1500

## 2014-02-24 MED ORDER — LIDOCAINE-PRILOCAINE 2.5-2.5 % EX CREA
1.0000 "application " | TOPICAL_CREAM | CUTANEOUS | Status: DC | PRN
  Filled 2014-02-24: qty 5

## 2014-02-24 MED ORDER — ALTEPLASE 2 MG IJ SOLR
2.0000 mg | Freq: Once | INTRAMUSCULAR | Status: AC | PRN
  Filled 2014-02-24: qty 2

## 2014-02-24 MED ORDER — IPRATROPIUM-ALBUTEROL 0.5-2.5 (3) MG/3ML IN SOLN
3.0000 mL | RESPIRATORY_TRACT | Status: DC
  Administered 2014-02-24 (×2): 3 mL via RESPIRATORY_TRACT
  Filled 2014-02-24 (×2): qty 3

## 2014-02-24 NOTE — Procedures (Signed)
I was present at this dialysis session, have reviewed the session itself and made  appropriate changes  [name] MD (pgr) [phone]    ([phone] 02/24/2014, 5:33 PM

## 2014-02-24 NOTE — H&P (Signed)
PULMONARY / CRITICAL CARE MEDICINE   Name: [name] [name] MRN: [id number] DOB: 10-Feb-1941    ADMISSION DATE:  02/24/2014 CONSULTATION DATE:  02/24/14  REFERRING MD :  ED PRIMARY SERVICE: PCCM  CHIEF COMPLAINT:  Unresponsive and cardiac arrest  BRIEF PATIENT DESCRIPTION: Patient is a 73 year old woman with a PMH of HTN, DM, PAD, CAD/MI with recent NSTEMI in March 2015, CHF with EF 30-35%, ESRD/HD MWF, COPD on home O2 2L, hx DVT and PE not on anticoagulant due to GIB, and Hypothyroidism.   Patient states that she has been fatigued for approximately 3 days, without clear precipitant at onset. She was noted to be unresponsive at Dialysis center and ? 2-10 mins CPR was performed (per EMS/ED note). Upon arrival to the ED, she was initially hemodynamically stable. Soon thereafter she became unresponsive, hypotensive and bradycardia. Her mental status quickly improved but restless after BVM and IVF. PCCM assumed care.   SIGNIFICANT EVENTS / STUDIES:  3/8   Hospitalized for NSTEMI, hypotension, UGIB and transaminitis  4/24 Unresponsive and received 2 minutes CPR at Dialysis center 4/24 Unresponsive, hypotensive and bradycardia at the ED and improved with BVM and IVF 4/24 CXR showed Pulmonary edema/infiltrates and RML/RLL opacities.  4/24 ABG showed acidemia with PH 7.219, PCO2 66.5, PO2 70, CO2 27 4/24 Bedside echo done and no pericardium effusion noted. Decreased LVEF 30-40%.  4/24 CVP inserted. CT angio of chest and DVT pending. PCCM assumed care   LINES / TUBES: 4/24 R Fem CVP>>> ? L IJ HD catheter>>>  CULTURES: 4/24 Blood>>> 4/24 Urine>>> 4/24 Respiratory>>> 4/24 lactic acid>>>1.88 4/24 PCT>>> 0.10  ANTIBIOTICS: 4/24 Vancomycin>>> 4/24 Ceftaz>>>  HISTORY OF PRESENT ILLNESS:  Patient is a 73 year old woman with a PMH of HTN, DM, PAD, CAD/MI with recent NSTEMI in March 2015, CHF with EF 30-35%, ESRD/HD MWF, COPD on home O2 2L, hx DVT and PE not on anticoagulant due to GIB, and  Hypothyroidism. Upon arrival to Dialysis center today, patient complained of "not feeling well." Pt was given BVM at dialysis and 2 min of CPR done. AED used and no shock was advised. Patient was brought to the [facility] ED and was alert and oriented initially, and became unresponsive, even with sternal rub, per EMS that was how pt presented to dialysis prior to CPR. She was noted to be hypotensive with BP of 70/50 and bradycardia with HR of 50's. She was given bag/valve mask ventilation and IVF, and subsequently became more responsive and BP improved. PCCM will assume the care.   Of note, she was recently hospitalized for NSTEMI 2/2 UGIB. Cardiology was consulted and no cardiac cath was planned. Coreg was added initially but discontinued due to hypotension.  A EGD showed gastric AVM. She received one unit of RBC and ASA was started. HGB was 8.9 upon discharge. She was also noted to be hypotensive and Imdur was DC'd. Midodrine 10 BID was added.   PAST MEDICAL HISTORY :  Past Medical History  Diagnosis Date  . Hypercholesterolemia   . Hypothyroidism   . Colitis 10/2011    C diff.   . Coronary artery disease     hx angina  . Hypertension   . GERD (gastroesophageal reflux disease)   . Myocardial infarction   . Peripheral vascular disease     claudication.   Marland Kitchen Hx of pulmonary embolus   . COPD (chronic obstructive pulmonary disease)     Nocturnal O2.   . Shortness of breath  uses O2 as needed  . Critical lower limb ischemia   . DVT (deep venous thrombosis) 2008    "right"  . Pneumonia     "several times"  . Type II diabetes mellitus     insulin requiring.   Marland Kitchen ESRD (end stage renal disease) on dialysis     M-W-F   [place] (12/07/2013)  . CHF (congestive heart failure)     Volume mgt with HD   Past Surgical History  Procedure Laterality Date  . Tubal ligation    . Toe amputation Right     5th toe  . Tonsillectomy    . Insertion of dialysis catheter Left     chest  . Av fistula  placement Right [id number]    RUE  . Bascilic vein transposition  May 18, 2012    First stage  . Bascilic vein transposition Left 07/27/12    Basilic Vein Transposition  . Arch aortogram  08/19/2012    Procedure: ARCH AORTOGRAM;  Surgeon: Fransisco Hertz, MD;  Location: [facility] OR;  Service: Vascular;  Laterality: N/A;  . Insertion of dialysis catheter  08/21/2012    Procedure: INSERTION OF DIALYSIS CATHETER;  Surgeon: Sherren Kerns, MD;  Location: [facility] OR;  Service: Vascular;  Laterality: Left;  internal jugular  . Cardiac catheterization  02/04/2012    LAD 40/99%, CRX OK, OM1 90% (small), RCA 90%/subtotal, failed PCI. Good L-R collaterals.  . Esophagogastroduodenoscopy N/A 01/06/2014    Procedure: ESOPHAGOGASTRODUODENOSCOPY (EGD);  Surgeon: Hilarie Fredrickson, MD;  Location: [facility] ENDOSCOPY;  Service: Endoscopy;  Laterality: N/A;   Prior to Admission medications   Medication Sig Start Date End Date Taking? Authorizing Provider  albuterol (PROVENTIL) (5 MG/ML) 0.5% nebulizer solution Take 2.5 mg by nebulization 2 (two) times daily. 10/31/13   Jerald Kief, MD  aspirin EC 81 MG tablet Take 81 mg by mouth daily.    Historical Provider, MD  calcium acetate (PHOSLO) 667 MG capsule Take 667 mg by mouth 3 (three) times daily with meals.    Historical Provider, MD  guaiFENesin (MUCINEX) 600 MG 12 hr tablet Take 1 tablet (600 mg total) by mouth 2 (two) times daily as needed for cough. 12/30/13   Alison Murray, MD  insulin aspart (NOVOLOG) 100 UNIT/ML injection Inject 2 Units into the skin 3 (three) times daily with meals. 12/30/13   Alison Murray, MD  insulin glargine (LANTUS) 100 UNIT/ML injection Inject 0.17 mLs (17 Units total) into the skin at bedtime. 12/30/13   Alison Murray, MD  levothyroxine (SYNTHROID, LEVOTHROID) 25 MCG tablet Take 25 mcg by mouth daily.     Historical Provider, MD  midodrine (PROAMATINE) 5 MG tablet Take 5 mg bid on non-dialysis days.  Take 10 mg bid on dialysis days. 01/08/14   Hollice Espy, MD  multivitamin (RENA-VIT) TABS tablet Take 1 tablet by mouth at bedtime. 12/30/13   Alison Murray, MD  nitroGLYCERIN (NITROSTAT) 0.4 MG SL tablet Place 0.4 mg under the tongue every 5 (five) minutes as needed. For chest pain.    Historical Provider, MD  NOVOLIN R RELION 100 UNIT/ML injection Inject 6-10 Units into the skin 2 (two) times daily. If cbg 300 or 400 use 10 units, if cbg 200 use 6 unit, if cbg 160 use 2 units. Sliding scale 10/15/13   Historical Provider, MD  pantoprazole (PROTONIX) 40 MG tablet Take 40 mg by mouth daily.     Historical Provider, MD  Probiotic Product (PROBIOTIC  PO) Take 1 capsule by mouth daily as needed (digestion).     Historical Provider, MD  simvastatin (ZOCOR) 80 MG tablet Take 1 tablet (80 mg total) by mouth every evening. 01/08/14   Hollice EspySendil K Krishnan, MD  tiotropium (SPIRIVA) 18 MCG inhalation capsule Place 1 capsule (18 mcg total) into inhaler and inhale daily. 11/13/13   Rhetta MuraJai-Gurmukh Samtani, MD   Allergies  Allergen Reactions  . Procaine Hcl Nausea And Vomiting and Other (See Comments)    [facility] of "comatose"    FAMILY HISTORY:  Family History  Problem Relation Age of Onset  . Diabetes type II    . Hypertension    . Diabetes Mother   . Kidney disease Mother   . Diabetes Father   . Anesthesia problems Neg Hx    SOCIAL HISTORY:  reports that she quit smoking about 2 years ago. Her smoking use included Cigarettes. She has a 40 pack-year smoking history. She has never used smokeless tobacco. She reports that she does not drink alcohol or use illicit drugs.  REVIEW OF SYSTEMS:   SOB on mask. Unable to obtain detailed information   VITAL SIGNS: Pulse Rate:  [95-114] 96 (04/24 0915) Resp:  [14-24] 21 (04/24 0915) BP: (72-218)/(42-91) 171/77 mmHg (04/24 0915) SpO2:  [93 %-100 %] 95 % (04/24 0915) HEMODYNAMICS:   VENTILATOR SETTINGS:   INTAKE / OUTPUT: Intake/Output   None     PHYSICAL  EXAMINATION: General:  Mild tp moderate distress. On 100% NRB Neuro:  Alert and follow commands HEENT:  PERRL Cardiovascular:  RRR, No M/G/R Lungs: B/L lung sounds diminished. Rales noted. No wheezing Abdomen:  Soft. NTND, BS x 4 Musculoskeletal:  No edema Skin:  intact  LABS:  CBC  Recent Labs Lab 02/24/14 0740  WBC 11.4*  HGB 10.3*  HCT 33.4*  PLT 149*   Coag's  Recent Labs Lab 02/24/14 0900  APTT 30  INR 0.96   BMET  Recent Labs Lab 02/24/14 0740  NA 141  K 3.2*  CL 111  CO2 16*  BUN 23  CREATININE 1.96*  GLUCOSE 186*   Electrolytes  Recent Labs Lab 02/24/14 0740 02/24/14 0900  CALCIUM 5.1*  --   MG  --  1.8  PHOS  --  4.5   Sepsis Markers  Recent Labs Lab 02/24/14 0759 02/24/14 0900  LATICACIDVEN 1.88 1.0  PROCALCITON  --  <0.10   ABG  Recent Labs Lab 02/24/14 0752 02/24/14 0950  PHART 7.198* 7.219*  PCO2ART 66.8* 66.5*  PO2ART 71.0* 70.0*   Liver Enzymes  Recent Labs Lab 02/24/14 0740  AST 44*  ALT 25  ALKPHOS 61  BILITOT <0.2*  ALBUMIN 2.0*   Cardiac Enzymes  Recent Labs Lab 02/24/14 0740 02/24/14 0900  TROPONINI <0.30 <0.30  PROBNP  --  32267.0*   Glucose No results found for this basename: GLUCAP,  in the last 168 hours  Imaging Dg Chest Port 1 View  02/24/2014   CLINICAL DATA:  post-arrest  EXAM: PORTABLE CHEST - 1 VIEW  COMPARISON:  DG CHEST 1V PORT dated 01/04/2014  FINDINGS: The cardiac silhouette is enlarged. There is diffuse prominence of interstitial markings, peribronchial cuffing, indistinctness of pulmonary vasculature. Areas of increased density in the lung bases. Small bilateral effusions left greater than right. No acute osseous abnormalities.  IMPRESSION: Pulmonary edema and small bilateral effusions. Areas of increased density within the lung bases consistent with areas of asymmetric edema versus atelectasis.   Electronically Signed  By: Salome Holmes M.D.   On: 02/24/2014 08:12   Dg Abd Portable  1v  02/24/2014   CLINICAL DATA:  confirm R fem line  EXAM: PORTABLE ABDOMEN - 1 VIEW  COMPARISON:  DG LUMBAR SPINE COMPLETE 4+V dated 01/16/2014; CT ABD/PELV WO CM dated 03/09/2013  FINDINGS: The bowel gas pattern is normal. Coarse vascular calcifications appreciated. Tip of a right femoral central venous catheter is appreciated projecting in the region of the proximal femoral vein on the right. No acute osseous abnormalities.  IMPRESSION: Nonobstructive bowel gas pattern. Right femoral vein central venous catheter appreciated.   Electronically Signed   By: Salome Holmes M.D.   On: 02/24/2014 08:11     ASSESSMENT / PLAN:  PULMONARY A: Acute respiratory failure, hypoxemic and hypercapnic Pulmonary edema in the setting of ESRD/HD, r/o cardiac source Pleural effusion ? HCAP COPD without exacerbation Hx of PE/DVT not on anticoagulant  P:   ABG reviewed BiPAP now and follow ABG in one hour, may require ETT Intubation as needed>>>not needed for now.  Pan cultures Trend Lactic acid and PCT Follow ABG and CXR Stat CT angio>>>if access able Stat LE Doppler>>> Duoneb Home inhaler Empiric Abx Vancomycin and Ceftaz Renal consult called Did my own limited echo, see [facility]  CARDIOVASCULAR A:  Sinus Bradycardia Recent NSTEMI Pulmonary edema Hx of hypotension, improved Hx of CHF, EF 30-35% in Jan 2015, managed with HD Hx of CAD/MI/PAD Hx of HTN Hx of HLD R/o PE, low suspciion P:  Follow EKG and Trops Follow Lytes Stat Echo i did limited echo- RV mild hypok, EF 35%, no peric effusion, mild TR, MR RV not suggestive of massive PE to cause arrest NS @75 /h Continue ASA, Zocor Hold home dose Midodrine given higher BP now Renal to pull fluid as BP tolerates Cortisol if drops bp correct lytes  RENAL A: Hypokalemia, K 3.2 Hypocalcemia, Ca 6.7 corrected Hypomagnesemia, Mg 1.8 ESRD with HD MWF  P:   Follow BMP/Mg/Phos Replace Lytes Calcium Gluconate 1g x 1 Continue Phoslo Renal  consult called.   GASTROINTESTINAL A: GERD   Hx of UGIB and 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 Recent hx of transaminitis, resolving  P:   NPO Trend CBC Follow LFT Home Protonix  HEMATOLOGIC A:   Anemia of chronic disease Hx of acute blood loss anemia, receive PRBC in March 2015 VTE Px  P:  Monitor CBC PT/INR pending Heparin SQ   INFECTIOUS A:   Leukocytosis ? HCAP R/o transient bacteremia  P:   Follow CBC Trend lactic acid and PCT Pan cultures Empiric Abx Vanc and Ceftaz--> narrow pending cultures results.  ENDOCRINE A:   ? Relative adrenal insufficiency Hypothyroidism T2DM   P:   TSH level Cortisol level Start stress steroid if BP not improved.  Lantus dose decreased to 10 SSI Continue Levothyroxine   NEUROLOGIC A:   Acute encephalopathy, improving  P:   Monitor off sedation.   Dede Query, MD PGY-3 IMTS  02/24/2014, 10:17 AM   TODAY'S SUMMARY: BP improved, ct chest if able, repeat abg, call renal, unclear etiology arrest, volume related? Lytes>, cardiac  I have personally obtained a history, examined the patient, evaluated laboratory and imaging results, formulated the assessment and plan and placed orders. CRITICAL CARE: The patient is critically ill with multiple organ systems failure and requires high complexity decision making for assessment and support, frequent evaluation and titration of therapies, application of advanced monitoring technologies and extensive interpretation of multiple databases. Critical Care Time devoted to  patient care services described in this note is  35 minutes.   Mcarthur Rossetti. Tyson Alias, MD, FACP Pgr: [phone] [facility]  Pulmonary and Critical Care Medicine [facility] Pager: [phone]

## 2014-02-24 NOTE — ED Notes (Signed)
Received pt from dialysis with c/o pt collapsed prior to treatment. Upon arrival to Dialysis pt complained of "not feeling well." Pt has history of heart attack last week. Pt was BVM at dialysis and 2 min of CPR done. AED used no shock was advised. Pt presents AAOx 3. During triage pt went unresponsive, per EMS that was how pt presented to dialysis prior to CPR. Dr Jeraldine Loots called to bedside, pulses noted pt with gasp breaths.

## 2014-02-24 NOTE — Procedures (Signed)
Echo limited  1. EF 35%, basilar hypo? 2. No perc effusion sig 3. RV mild hypokinetci, no major dilation  Only able to get subcostal views  Mcarthur Rossetti. Tyson Alias, MD, FACP Pgr: [phone] [facility]

## 2014-02-24 NOTE — Progress Notes (Signed)
Per Dr. Tyson Alias and Brett Canales Minor, N.P. Right Femoral TLC may be accessed for meds and drips.

## 2014-02-24 NOTE — Progress Notes (Signed)
02/24/2014- Resp care note- Pt initially placed on BIPAP today as per Dr Dede Query.  Pt weaned to nasal cannula at approx 1700 as per Dr Sung Amabile.  Pt tolerating 5lpm cannula well with sats of 92-96%.  Will wean oxygen as tolerated.  BIPAP on stand-by at bedside.  S Elain Wixon rrt, rcp

## 2014-02-24 NOTE — Care Management Note (Signed)
    Page 1 of 1   02/24/2014     10:51:29 AM CARE MANAGEMENT NOTE 02/24/2014  Patient:  [name] [name], [name] [name]   Account Number:  [id number]  Date Initiated:  02/24/2014  Documentation initiated by:  Junius Creamer  Subjective/Objective Assessment:   adm w post arrest     Action/Plan:   lives w fam,, has home o2 ahc, pcp dr Tinnie Gens hooper   Anticipated DC Date:     Anticipated DC Plan:           Choice offered to / List presented to:             Status of service:   Medicare Important Message given?   (If response is "NO", the following Medicare IM given date fields will be blank) Date Medicare IM given:   Date Additional Medicare IM given:    Discharge Disposition:    Per UR Regulation:  Reviewed for med. necessity/level of care/duration of stay  If discussed at Long Length of Stay Meetings, dates discussed:    Comments:

## 2014-02-24 NOTE — Procedures (Signed)
Central Venous Catheter Insertion Procedure Note ZEKIA GRAHEK [id number] 09/11/1941  Procedure: Insertion of Central Venous Catheter Indications: Assessment of intravascular volume, Drug and/or fluid administration and Frequent blood sampling  Procedure Details Consent: Risks of procedure as well as the alternatives and risks of each were explained to the (patient/caregiver).  Consent for procedure obtained. Time Out: Verified patient identification, verified procedure, site/side was marked, verified correct patient position, special equipment/implants available, medications/allergies/relevent history reviewed, required imaging and test results available.  Performed  Maximum sterile technique was used including antiseptics, cap, gloves, gown, hand hygiene, mask and sheet. Skin prep: Chlorhexidine; local anesthetic administered A antimicrobial bonded/coated triple lumen catheter was placed in the right femoral vein due to multiple attempts, no other available access using the Seldinger technique. Ultrasound guidance used.yes Catheter placed to 20 cm. Blood aspirated via all 3 ports and then flushed x 3. Line sutured x 2 and dressing applied.  Evaluation Blood flow good Complications: No apparent complications Patient did tolerate procedure well. Chest X-ray ordered to verify placement.  CXR: normal.  Brett Canales Minor ACNP [facility] PCCM Pager [phone] till 3 pm If no answer page [phone] 02/24/2014, 9:12 AM   Tol[place]  [place]  Mcarthur Rossetti. Tyson Alias, MD, FACP Pgr: [phone] [facility]

## 2014-02-24 NOTE — Progress Notes (Signed)
Placed on Contact precautions @ 1300 per IP Staff for history of ESBL in urine.  Isolation hanger placed on door and patient aware and educated.

## 2014-02-24 NOTE — ED Notes (Signed)
Lactic acid results given to Dr. Lockwood 

## 2014-02-24 NOTE — ED Provider Notes (Signed)
CSN: [id number]     Arrival date & time 02/24/14  0706 History   First MD Initiated Contact with Patient 02/24/14 [phone]     Chief Complaint  Patient presents with  . Illegal value: [    Post CPR      HPI  Patient presents from dialysis after an episode of possible cardiac arrest. On my initial evaluation the patient is awake, though listless.  She describes generalized fatigue, but no focal pain.  She states that she has been fatigued for approximately 3 days, without clear precipitant at onset. Today the patient went to dialysis. She awoke having not received to dialysis, but having received CPR and BVM ventilation after becoming unresponsive (per EMS). Per EMS the patient became unresponsive and received ~5-10 minutes of resuscitation before becoming responsive. Patient last received HD 2d ago.  She notes a Hx of MI 4638m pta as well as MMP.  After the initial eval the patient became unresponsive...  Past Medical History  Diagnosis Date  . Hypercholesterolemia   . Hypothyroidism   . Colitis 10/2011    C diff.   . Coronary artery disease     hx angina  . Hypertension   . GERD (gastroesophageal reflux disease)   . Myocardial infarction   . Peripheral vascular disease     claudication.   Marland Kitchen. Hx of pulmonary embolus   . COPD (chronic obstructive pulmonary disease)     Nocturnal O2.   . Shortness of breath     uses O2 as needed  . Critical lower limb ischemia   . DVT (deep venous thrombosis) 2008    "right"  . Pneumonia     "several times"  . Type II diabetes mellitus     insulin requiring.   Marland Kitchen. ESRD (end stage renal disease) on dialysis     M-W-F   [place] (12/07/2013)  . CHF (congestive heart failure)     Volume mgt with HD   Past Surgical History  Procedure Laterality Date  . Tubal ligation    . Toe amputation Right     5th toe  . Tonsillectomy    . Insertion of dialysis catheter Left     chest  . Av fistula placement Right [id number]    RUE  . Bascilic vein  transposition  May 18, 2012    First stage  . Bascilic vein transposition Left 07/27/12    Basilic Vein Transposition  . Arch aortogram  08/19/2012    Procedure: ARCH AORTOGRAM;  Surgeon: Fransisco HertzBrian L Chen, MD;  [facility];  Service: Vascular;  Laterality: N/A;  . Insertion of dialysis catheter  08/21/2012    Procedure: INSERTION OF DIALYSIS CATHETER;  Surgeon: Sherren Kernsharles E Fields, MD;  [facility];  Service: Vascular;  Laterality: Left;  internal jugular  . Cardiac catheterization  02/04/2012    LAD 40/99%, CRX OK, OM1 90% (small), RCA 90%/subtotal, failed PCI. Good L-R collaterals.  . Esophagogastroduodenoscopy N/A 01/06/2014    Procedure: ESOPHAGOGASTRODUODENOSCOPY (EGD);  Surgeon: Hilarie FredricksonJohn N Perry, MD;  [facility];  Service: Endoscopy;  Laterality: N/A;   Family History  Problem Relation Age of Onset  . Diabetes type II    . Hypertension    . Diabetes Mother   . Kidney disease Mother   . Diabetes Father   . Anesthesia problems Neg Hx    History  Substance Use Topics  . Smoking status: Former Smoker -- 1.00 packs/day for 40 years    Types: Cigarettes  Quit date: 06/11/2011  . Smokeless tobacco: Never Used  . Alcohol Use: No   OB History   Grav Para Term Preterm Abortions TAB SAB Ect Mult Living                 Review of Systems  Unable to perform ROS: Acuity of condition      Allergies  Procaine hcl  Home Medications   Prior to Admission medications   Medication Sig Start Date End Date Taking? Authorizing Provider  albuterol (PROVENTIL) (5 MG/ML) 0.5% nebulizer solution Take 2.5 mg by nebulization 2 (two) times daily. 10/31/13   Jerald Kief, MD  aspirin EC 81 MG tablet Take 81 mg by mouth daily.    Historical Provider, MD  calcium acetate (PHOSLO) 667 MG capsule Take 667 mg by mouth 3 (three) times daily with meals.    Historical Provider, MD  guaiFENesin (MUCINEX) 600 MG 12 hr tablet Take 1 tablet (600 mg total) by mouth 2 (two) times daily as needed  for cough. 12/30/13   Alison Murray, MD  insulin aspart (NOVOLOG) 100 UNIT/ML injection Inject 2 Units into the skin 3 (three) times daily with meals. 12/30/13   Alison Murray, MD  insulin glargine (LANTUS) 100 UNIT/ML injection Inject 0.17 mLs (17 Units total) into the skin at bedtime. 12/30/13   Alison Murray, MD  levothyroxine (SYNTHROID, LEVOTHROID) 25 MCG tablet Take 25 mcg by mouth daily.     Historical Provider, MD  midodrine (PROAMATINE) 5 MG tablet Take 5 mg bid on non-dialysis days.  Take 10 mg bid on dialysis days. 01/08/14   Hollice Espy, MD  multivitamin (RENA-VIT) TABS tablet Take 1 tablet by mouth at bedtime. 12/30/13   Alison Murray, MD  nitroGLYCERIN (NITROSTAT) 0.4 MG SL tablet Place 0.4 mg under the tongue every 5 (five) minutes as needed. For chest pain.    Historical Provider, MD  NOVOLIN R RELION 100 UNIT/ML injection Inject 6-10 Units into the skin 2 (two) times daily. If cbg 300 or 400 use 10 units, if cbg 200 use 6 unit, if cbg 160 use 2 units. Sliding scale 10/15/13   Historical Provider, MD  pantoprazole (PROTONIX) 40 MG tablet Take 40 mg by mouth daily.     Historical Provider, MD  Probiotic Product (PROBIOTIC PO) Take 1 capsule by mouth daily as needed (digestion).     Historical Provider, MD  simvastatin (ZOCOR) 80 MG tablet Take 1 tablet (80 mg total) by mouth every evening. 01/08/14   Hollice Espy, MD  tiotropium (SPIRIVA) 18 MCG inhalation capsule Place 1 capsule (18 mcg total) into inhaler and inhale daily. 11/13/13   Rhetta Mura, MD   BP 195/88  Pulse 105  Resp 24  SpO2 93% Physical Exam  Nursing note and vitals reviewed. Constitutional: She appears distressed.  Listless, but initially interactive elderly F  HENT:  Head: Normocephalic and atraumatic.  Eyes: Conjunctivae are normal. Right eye exhibits no discharge. Left eye exhibits no discharge.  Neck: No tracheal deviation present.  Cardiovascular: Normal rate.   Murmur heard. Minimally  appreciable pulses  Pulmonary/Chest: No stridor. Apnea and bradypnea noted. She has decreased breath sounds.    Abdominal: Soft. Normal appearance.  Neurological:  initially slow but interactive and moving all extremities - patient then became unresponsive. No facial asym When speaking, speech is clear     Skin: She is diaphoretic.    ED Course  Procedures (including critical care time) Labs Review Labs  Reviewed  CBC WITH DIFFERENTIAL - Abnormal; Notable for the following:    WBC 11.4 (*)    RBC 3.41 (*)    Hemoglobin 10.3 (*)    HCT 33.4 (*)    RDW 16.5 (*)    Platelets 149 (*)    Neutrophils Relative % 84 (*)    Neutro Abs 9.6 (*)    Lymphocytes Relative 9 (*)    All other components within normal limits  I-STAT ARTERIAL BLOOD GAS, ED - Abnormal; Notable for the following:    pH, Arterial 7.198 (*)    pCO2 arterial 66.8 (*)    pO2, Arterial 71.0 (*)    Bicarbonate 26.0 (*)    Acid-base deficit 4.0 (*)    All other components within normal limits  CULTURE, BLOOD (ROUTINE X 2)  CULTURE, BLOOD (ROUTINE X 2)  URINE CULTURE  CULTURE, RESPIRATORY (NON-EXPECTORATED)  COMPREHENSIVE METABOLIC PANEL  TROPONIN I  BLOOD GAS, ARTERIAL  BLOOD GAS, ARTERIAL  MAGNESIUM  PHOSPHORUS  LIPASE, BLOOD  TROPONIN I  TROPONIN I  TROPONIN I  LACTIC ACID, PLASMA  LACTIC ACID, PLASMA  LACTIC ACID, PLASMA  PROCALCITONIN  PRO B NATRIURETIC PEPTIDE  CORTISOL  PROTIME-INR  APTT  URINALYSIS, ROUTINE W REFLEX MICROSCOPIC  STREP PNEUMONIAE URINARY ANTIGEN  LEGIONELLA ANTIGEN, URINE  TSH  I-STAT CG4 LACTIC ACID, ED    Imaging Review Dg Chest Port 1 View  02/24/2014   CLINICAL DATA:  post-arrest  EXAM: PORTABLE CHEST - 1 VIEW  COMPARISON:  DG CHEST 1V PORT dated 01/04/2014  FINDINGS: The cardiac silhouette is enlarged. There is diffuse prominence of interstitial markings, peribronchial cuffing, indistinctness of pulmonary vasculature. Areas of increased density in the lung bases. Small  bilateral effusions left greater than right. No acute osseous abnormalities.  IMPRESSION: Pulmonary edema and small bilateral effusions. Areas of increased density within the lung bases consistent with areas of asymmetric edema versus atelectasis.   Electronically Signed   By: Salome Holmes M.D.   On: 02/24/2014 08:12   Dg Abd Portable 1v  02/24/2014   CLINICAL DATA:  confirm R fem line  EXAM: PORTABLE ABDOMEN - 1 VIEW  COMPARISON:  DG LUMBAR SPINE COMPLETE 4+V dated 01/16/2014; CT ABD/PELV WO CM dated 03/09/2013  FINDINGS: The bowel gas pattern is normal. Coarse vascular calcifications appreciated. Tip of a right femoral central venous catheter is appreciated projecting in the region of the proximal femoral vein on the right. No acute osseous abnormalities.  IMPRESSION: Nonobstructive bowel gas pattern. Right femoral vein central venous catheter appreciated.   Electronically Signed   By: Salome Holmes M.D.   On: 02/24/2014 08:11     EKG Interpretation   Date/Time:  Friday February 24 2014 07:14:15 EDT Ventricular Rate:  50 PR Interval:  175 QRS Duration: 118 QT Interval:  474 QTC Calculation: 432 R Axis:   82 Text Interpretation:  Sinus rhythm Nonspecific intraventricular conduction  delay Anteroseptal infarct, age indeterminate ST elevation, consider  inferior injury Lateral leads are also involved Baseline wander in lead(s)  I III aVR aVL aVF V1 V3 V4 V6 Sinus rhythm ST-t wave abnormality  Non-specific intra-ventricular conduction delay Abnormal ekg Confirmed by  Gerhard Munch  MD [phone]) on 02/24/2014 7:55:44 AM       Soon after arrival the patient became unresponsive - even with sternal rub. BP was 70/50 and she was bradycardic - 50's. She continued to have spontaneous respiration, though slowed. Pulses were minimally palpable in the femoral region.   IV  access was obtained in R foot, and subsequently in the R femoral vein ([place] central line).  Patient received bag/valve mask  ventilation and IVF.  CENTRAL LINE Performed by: Gerhard Munch Consent: The procedure was performed in an emergent situation. Required items: required blood products, implants, devices, and special equipment available Patient identity confirmed: arm band and provided demographic data Time out: Immediately prior to procedure a "time out" was called to verify the correct patient, procedure, equipment, support staff and site/side marked as required. Indications: vascular access Anesthesia: local infiltration Local anesthetic: lidocaine 1% with epinephrine Anesthetic total: 3 ml Patient sedated: no Preparation: skin prepped with 2% chlorhexidine Skin prep agent dried: skin prep agent completely dried prior to procedure Sterile barriers used as much as possible given the patient's condition (extremis) Hand hygiene: hand hygiene performed prior to central venous catheter insertion  Location details: R femoral vein  Catheter type: triple lumen Catheter size: 8 Fr Pre-procedure: landmarks identified Ultrasound guidance: yes Successful placement: yes Post-procedure: line sutured and dressing applied Assessment: blood return through all parts, free fluid flow, placement verified by x-ray. Patient tolerance: Patient tolerated the procedure well with no immediate complications.    Patient became more responsive, and BP improved.  ABG 7.19/66/71  7:58 AM I discussed her case with PCCM.  O2- 99% w NRB  8:44 AM Patient is receiving additional access (PCCM) She remains awake, now w BP 180/80, HR 100's IVF running  XR chest(interpretated by me) bilateral effusions, abd - veinous placement identified     MDM   This patient presents post arrest from dialysis. On initial evaluation the patient is awake, listless, but hemodynamically stable.  Soon thereafter the patient became hypotensive, bradycardic, unresponsive. Patient received assisted ventilation via bag valve mask, fluid  resuscitation and placement of a femoral venous catheter. Following resuscitation the patient returned to interactivity, though she remained listless. Patient's initial evaluation was notable for acidemia, bilateral pleural effusions.  EKG was abnormal, with diffuse changes. Given the patient's critical illness she required admission to the ICU for further evaluation and management at resuscitation in the emergency department.  CRITICAL CARE Performed by: Gerhard Munch Total critical care time: 35 Critical care time was exclusive of separately billable procedures and treating other patients. Critical care was necessary to treat or prevent imminent or life-threatening deterioration. Critical care was time spent personally by me on the following activities: development of treatment plan with patient and/or surrogate as well as nursing, discussions with consultants, evaluation of patient's response to treatment, examination of patient, obtaining history from patient or surrogate, ordering and performing treatments and interventions, ordering and review of laboratory studies, ordering and review of radiographic studies, pulse oximetry and re-evaluation of patient's condition.    Gerhard Munch, MD 02/24/14 [phone]

## 2014-02-24 NOTE — Progress Notes (Signed)
ANTIBIOTIC CONSULT NOTE - INITIAL  Pharmacy Consult for Vancomycin, Ceftazidime  Indication: rule out sepsis  Allergies  Allergen Reactions  . Procaine Hcl Nausea And Vomiting and Other (See Comments)    [facility] has reaction of "comatose"    Patient Measurements:   Adjusted Body Weight: n/a  Vital Signs: BP: 181/83 mmHg (04/24 0800) Pulse Rate: 103 (04/24 0800) Intake/Output from previous day:   Intake/Output from this shift:    Labs:  Recent Labs  02/24/14 0740  WBC 11.4*  HGB 10.3*  PLT 149*   The CrCl is unknown because both a height and weight (above a minimum accepted value) are required for this calculation. No results found for this basename: VANCOTROUGH, VANCOPEAK, VANCORANDOM, GENTTROUGH, GENTPEAK, GENTRANDOM, TOBRATROUGH, TOBRAPEAK, TOBRARND, AMIKACINPEAK, AMIKACINTROU, AMIKACIN,  in the last 72 hours   Microbiology: No results found for this or any previous visit (from the past 720 hour(s)).  Medical History: Past Medical History  Diagnosis Date  . Hypercholesterolemia   . Hypothyroidism   . Colitis 10/2011    C diff.   . Coronary artery disease     hx angina  . Hypertension   . GERD (gastroesophageal reflux disease)   . Myocardial infarction   . Peripheral vascular disease     claudication.   Marland Kitchen Hx of pulmonary embolus   . COPD (chronic obstructive pulmonary disease)     Nocturnal O2.   . Shortness of breath     uses O2 as needed  . Critical lower limb ischemia   . DVT (deep venous thrombosis) 2008    "right"  . Pneumonia     "several times"  . Type II diabetes mellitus     insulin requiring.   Marland Kitchen ESRD (end stage renal disease) on dialysis     M-W-F   [place] (12/07/2013)  . CHF (congestive heart failure)     Volume mgt with HD    Medications:   (Not in a hospital admission) Assessment: 40 YOF with PMH of ESRD on MWF HD presents from dialysis after an episode of possible cardiac arrest. Her last dialysis was 2 days  ago. Pharmacy to start Ceftazidime and Vancomycin as empiric therapy. WBC mildly elevated at 11.4. Bmet pending   Cultures: 4/24 Blood Cx x2>> 4/24 Urine Cx >>  4/24 Trach aspirate>>   Goal of Therapy:  Vancomycin trough level 15-20 mcg/ml Resolution of infection  Plan:  1) Give Vancomycin loading dose of 1500 mg x 1 dose today 2) Give Ceftazidime 2 gm IV today followed by 2 gm IV on HD days  3) Monitor CBC, renal fx, cultures, and patient's clinical progress 4) Collect vancomycin levels as indicated  5) F/u Bmet and HD schedule   Vinnie Level, PharmD.  Clinical Pharmacist Pager [phone]

## 2014-02-24 NOTE — Progress Notes (Signed)
VASCULAR LAB PRELIMINARY  PRELIMINARY  PRELIMINARY  PRELIMINARY  Bilateral lower extremity venous duplex  completed.    Preliminary report:  Bilateral:  No evidence of DVT, superficial thrombosis, or Baker's Cyst.    Gara Kroner, RVT 02/24/2014, 12:26 PM    3

## 2014-02-24 NOTE — Consult Note (Signed)
WOC wound consult note Reason for Consult: Consult requested for left heel wound.  Pt states this is a chronic diabetic ulcer and has been present for over a year.  She is followed by the outpatient wound care center and uses Santyl and receives serial debridements. ABI performed on 8/14 indicates severe vascular insufficiency= .44.   Wound type: Full thickness Pressure Ulcer POA: This is NOT a pressure ulcer.  It was present on admission. Measurement: 1.5X1cm Wound bed: 100% dry yellow slough Drainage (amount, consistency, odor) No odor or drainage. Periwound: Intact skin surrounding. Dressing procedure/placement/frequency: Continue present plan of care with Santyl for chemical debridement.  Float heel to reduce pressure.  Pt can resume follow-up with outpatient wound care center after discharge. Please re-consult if further assistance is needed.  Thank-you,  Cammie Mcgee MSN, RN, CWOCN, [place], CNS [phone]

## 2014-02-24 NOTE — Consult Note (Signed)
Renal Service Consult Note [facility]  [name] [name] 02/24/2014 [name] Requesting Physician:  Dr Tyson Alias  Reason for Consult:  ESRD pt LOC episode HPI: The patient is a 73 y.o. year-old with hx of HTN, CAD, Cdif and ESRD.  Patient was getting ready to get on dialysis in [place] this morning and became unresponsive.  At the HD unit they did CPR and used a BVM for about 2 mins. AED used and no shock was advised.  Pt became alert and was transported to ED by EMS.  Became unresponsive in ED then and was put on BiPap for CO2 retention on ABG.  She is now alert. CXR showed pulm edema of mild-mod severity, questionable infiltrate and ABX were started and pt admitted to ICU.  She is now alert and has no complaints. Says that she just "didn't feel good" this morning but denies any specific c/o's such as SOB, chest pain or n/v/d.  Signed off early from Wed HD.    ROS  no rash  no confusion  no abd pain   no cough  no HA  no visual chg  Past Medical History  Past Medical History  Diagnosis Date  . ESRD on hemodialysis     Started HD in December 2013.  Cause of ESRD unknown.  Gets HD in [place] on MWF schedule. She had a right upper arm access which failed just short of one year.  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  She says she has "poor circulation" in the legs so they won't do a leg access. She remains catheter dependent as of   . Hypothyroidism   . Colitis 10/2011    C diff.   . Coronary artery disease     hx angina  . Hypertension   . GERD (gastroesophageal reflux disease)   . Myocardial infarction   . Peripheral vascular disease     claudication.   Marland Kitchen Hx of pulmonary embolus   . COPD (chronic obstructive pulmonary disease)     Nocturnal O2.   . Shortness of breath     uses O2 as needed  . Critical lower limb ischemia   . DVT (deep venous thrombosis) 2008    "right"  .  Pneumonia     "several times"  . Type II diabetes mellitus     insulin requiring.   . CHF (congestive heart failure)     Volume mgt with HD  . Hypercholesteremia    Past Surgical History  Past Surgical History  Procedure Laterality Date  . Tubal ligation    . Toe amputation Right     5th toe  . Tonsillectomy    . Insertion of dialysis catheter Left     chest  . Av fistula placement Right [id number]    RUE  . Bascilic vein transposition  May 18, 2012    First stage  . Bascilic vein transposition Left 07/27/12    Basilic Vein Transposition  . Arch aortogram  08/19/2012    Procedure: ARCH AORTOGRAM;  Surgeon: Fransisco Hertz, MD;  Location: [facility] OR;  Service: Vascular;  Laterality: N/A;  . Insertion of dialysis catheter  08/21/2012    Procedure: INSERTION OF DIALYSIS CATHETER;  Surgeon: Sherren Kerns, MD;  Location: [facility] OR;  Service: Vascular;  Laterality: Left;  internal jugular  . Cardiac catheterization  02/04/2012    LAD  40/99%, CRX OK, OM1 90% (small), RCA 90%/subtotal, failed PCI. Good L-R collaterals.  . Esophagogastroduodenoscopy N/A 01/06/2014    Procedure: ESOPHAGOGASTRODUODENOSCOPY (EGD);  Surgeon: Hilarie Fredrickson, MD;  Location: [facility] ENDOSCOPY;  Service: Endoscopy;  Laterality: N/A;   Family History  Family History  Problem Relation Age of Onset  . Diabetes type II    . Hypertension    . Diabetes Mother   . Kidney disease Mother   . Diabetes Father   . Anesthesia problems Neg Hx    Social History  reports that she quit smoking about 2 years ago. Her smoking use included Cigarettes. She has a 40 pack-year smoking history. She has never used smokeless tobacco. She reports that she does not drink alcohol or use illicit drugs. Allergies  Allergies  Allergen Reactions  . Procaine Hcl Nausea And Vomiting and Other (See Comments)    [facility] has reaction of "comatose"   Home medications Prior to Admission medications   Medication Sig Start Date End Date Taking?  Authorizing Provider  albuterol (PROVENTIL) (5 MG/ML) 0.5% nebulizer solution Take 2.5 mg by nebulization 2 (two) times daily. 10/31/13   Jerald Kief, MD  aspirin EC 81 MG tablet Take 81 mg by mouth daily.    Historical Provider, MD  calcium acetate (PHOSLO) 667 MG capsule Take 667 mg by mouth 3 (three) times daily with meals.    Historical Provider, MD  guaiFENesin (MUCINEX) 600 MG 12 hr tablet Take 1 tablet (600 mg total) by mouth 2 (two) times daily as needed for cough. 12/30/13   Alison Murray, MD  insulin aspart (NOVOLOG) 100 UNIT/ML injection Inject 2 Units into the skin 3 (three) times daily with meals. 12/30/13   Alison Murray, MD  insulin glargine (LANTUS) 100 UNIT/ML injection Inject 0.17 mLs (17 Units total) into the skin at bedtime. 12/30/13   Alison Murray, MD  levothyroxine (SYNTHROID, LEVOTHROID) 25 MCG tablet Take 25 mcg by mouth daily.     Historical Provider, MD  midodrine (PROAMATINE) 5 MG tablet Take 5 mg bid on non-dialysis days.  Take 10 mg bid on dialysis days. 01/08/14   Hollice Espy, MD  multivitamin (RENA-VIT) TABS tablet Take 1 tablet by mouth at bedtime. 12/30/13   Alison Murray, MD  nitroGLYCERIN (NITROSTAT) 0.4 MG SL tablet Place 0.4 mg under the tongue every 5 (five) minutes as needed. For chest pain.    Historical Provider, MD  NOVOLIN R RELION 100 UNIT/ML injection Inject 6-10 Units into the skin 2 (two) times daily. If cbg 300 or 400 use 10 units, if cbg 200 use 6 unit, if cbg 160 use 2 units. Sliding scale 10/15/13   Historical Provider, MD  pantoprazole (PROTONIX) 40 MG tablet Take 40 mg by mouth daily.     Historical Provider, MD  Probiotic Product (PROBIOTIC PO) Take 1 capsule by mouth daily as needed (digestion).     Historical Provider, MD  simvastatin (ZOCOR) 80 MG tablet Take 1 tablet (80 mg total) by mouth every evening. 01/08/14   Hollice Espy, MD  tiotropium (SPIRIVA) 18 MCG inhalation capsule Place 1 capsule (18 mcg total) into inhaler and inhale  daily. 11/13/13   Rhetta Mura, MD   Liver Function Tests  Recent Labs Lab 02/24/14 0740  AST 44*  ALT 25  ALKPHOS 61  BILITOT <0.2*  PROT 3.9*  ALBUMIN 2.0*    Recent Labs Lab 02/24/14 0900  LIPASE 23   CBC  Recent Labs  Lab 02/24/14 0740  WBC 11.4*  NEUTROABS 9.6*  HGB 10.3*  HCT 33.4*  MCV 97.9  PLT 149*   Basic Metabolic Panel  Recent Labs Lab 02/24/14 0740 02/24/14 0900  NA 141  --   K 3.2*  --   CL 111  --   CO2 16*  --   GLUCOSE 186*  --   BUN 23  --   CREATININE 1.96*  --   CALCIUM 5.1*  --   PHOS  --  4.5    Filed Vitals:   02/24/14 1130 02/24/14 1145 02/24/14 1200 02/24/14 1213  BP: 206/87 200/85 214/86 200/85  Pulse: 96 95 94 93  Temp:   98.4 F (36.9 C)   TempSrc:   Oral   Resp: 19 21 21 23   Height:      Weight:      SpO2: 97% 97% 94% 94%   Exam Elderly obese WF on bipap No rash, cyanosis or gangrene Sclera anicteric, throat clear No jvd Chest is clear bilat  RRR no MRG Abd obese, soft, NTND No LE or UE edema Neuro is nf, ox3 L IJ cath in place  Dialysis: MWF [place] 4h  64kg   F160   2/2.5 [place]   Heparin none  Prof 4   L IJ catheter Hectorol 2   EPO none Labs: ferr 1559, tsat 47%, phos 3.5, pth 257  Assessment: 1 Acute on chronic resp failure- underlying copd/ OHS likely exacerbated by acute pulm edema 2 ESRD on HD 3 Chronic resp failure on home O2 4 Hx CAD/MI 5 DM2 6 Anemia 7 MBD cont binders 8 Hypertension- usually BP's low and she takes midodrine at home 5 bid; BP here is high, observe with HD, hold midodrine for now 9 Hypocalcemia- agree with replacing IV for now  Plan - HD in ICU today, remove vol as tolerated  Vinson Moselleob Oleva Koo MD (pgr) [phone]    (c) [phone] 02/24/2014, 1:03 PM

## 2014-02-24 NOTE — Progress Notes (Signed)
  Echocardiogram 2D Echocardiogram has been performed.  [name] [name] 02/24/2014, 9:47 AM

## 2014-02-25 ENCOUNTER — Inpatient Hospital Stay (HOSPITAL_COMMUNITY): Payer: Medicare Other

## 2014-02-25 DIAGNOSIS — R55 Syncope and collapse: Secondary | ICD-10-CM

## 2014-02-25 DIAGNOSIS — I509 Heart failure, unspecified: Secondary | ICD-10-CM

## 2014-02-25 DIAGNOSIS — Z992 Dependence on renal dialysis: Secondary | ICD-10-CM

## 2014-02-25 DIAGNOSIS — I5042 Chronic combined systolic (congestive) and diastolic (congestive) heart failure: Secondary | ICD-10-CM

## 2014-02-25 DIAGNOSIS — N186 End stage renal disease: Secondary | ICD-10-CM

## 2014-02-25 LAB — CBC WITH DIFFERENTIAL/PLATELET
BASOS PCT: 1 % (ref 0–1)
Basophils Absolute: 0.1 10*3/uL (ref 0.0–0.1)
EOS ABS: 0 10*3/uL (ref 0.0–0.7)
Eosinophils Relative: 0 % (ref 0–5)
HCT: 37.8 % (ref 36.0–46.0)
Hemoglobin: 11.8 g/dL — ABNORMAL LOW (ref 12.0–15.0)
Lymphocytes Relative: 7 % — ABNORMAL LOW (ref 12–46)
Lymphs Abs: 0.8 10*3/uL (ref 0.7–4.0)
MCH: 29.5 pg (ref 26.0–34.0)
MCHC: 31.2 g/dL (ref 30.0–36.0)
MCV: 94.5 fL (ref 78.0–100.0)
Monocytes Absolute: 0.7 10*3/uL (ref 0.1–1.0)
Monocytes Relative: 6 % (ref 3–12)
NEUTROS PCT: 86 % — AB (ref 43–77)
Neutro Abs: 9.7 10*3/uL — ABNORMAL HIGH (ref 1.7–7.7)
PLATELETS: 166 10*3/uL (ref 150–400)
RBC: 4 MIL/uL (ref 3.87–5.11)
RDW: 16.5 % — ABNORMAL HIGH (ref 11.5–15.5)
WBC: 11.3 10*3/uL — ABNORMAL HIGH (ref 4.0–10.5)

## 2014-02-25 LAB — LACTIC ACID, PLASMA
LACTIC ACID, VENOUS: 1.6 mmol/L (ref 0.5–2.2)
LACTIC ACID, VENOUS: 1.3 mmol/L (ref 0.5–2.2)
Lactic Acid, Venous: 2.1 mmol/L (ref 0.5–2.2)
Lactic Acid, Venous: 2.4 mmol/L — ABNORMAL HIGH (ref 0.5–2.2)

## 2014-02-25 LAB — LEGIONELLA ANTIGEN, URINE: Legionella Antigen, Urine: NEGATIVE

## 2014-02-25 LAB — BASIC METABOLIC PANEL
BUN: 15 mg/dL (ref 6–23)
CALCIUM: 8.6 mg/dL (ref 8.4–10.5)
CO2: 26 mEq/L (ref 19–32)
Chloride: 98 mEq/L (ref 96–112)
Creatinine, Ser: 2.04 mg/dL — ABNORMAL HIGH (ref 0.50–1.10)
GFR calc Af Amer: 27 mL/min — ABNORMAL LOW (ref >90)
GFR, EST NON AFRICAN AMERICAN: 24 mL/min — AB (ref >90)
Glucose, Bld: 161 mg/dL — ABNORMAL HIGH (ref 70–99)
POTASSIUM: 4.3 meq/L (ref 3.7–5.3)
SODIUM: 138 meq/L (ref 137–147)

## 2014-02-25 LAB — GLUCOSE, CAPILLARY
GLUCOSE-CAPILLARY: 243 mg/dL — AB (ref 70–99)
Glucose-Capillary: 126 mg/dL — ABNORMAL HIGH (ref 70–99)
Glucose-Capillary: 198 mg/dL — ABNORMAL HIGH (ref 70–99)

## 2014-02-25 LAB — MAGNESIUM: Magnesium: 1.8 mg/dL (ref 1.5–2.5)

## 2014-02-25 LAB — PHOSPHORUS: PHOSPHORUS: 2.9 mg/dL (ref 2.3–4.6)

## 2014-02-25 MED ORDER — LIDOCAINE HCL (PF) 1 % IJ SOLN: 5.0000 mL | INTRAMUSCULAR | Status: DC | PRN

## 2014-02-25 MED ORDER — SODIUM CHLORIDE 0.9 % IV SOLN: 100.0000 mL | INTRAVENOUS | Status: DC | PRN

## 2014-02-25 MED ORDER — HEPARIN SODIUM (PORCINE) 1000 UNIT/ML IJ SOLN
4600.0000 [IU] | Freq: Once | INTRAMUSCULAR | Status: AC
  Administered 2014-02-25: 4600 [IU] via INTRAVENOUS

## 2014-02-25 MED ORDER — VANCOMYCIN HCL IN DEXTROSE 750-5 MG/150ML-% IV SOLN
750.0000 mg | INTRAVENOUS | Status: AC
  Administered 2014-02-25: 750 mg via INTRAVENOUS
  Filled 2014-02-25: qty 150

## 2014-02-25 MED ORDER — PENTAFLUOROPROP-TETRAFLUOROETH EX AERO: 1.0000 "application " | INHALATION_SPRAY | CUTANEOUS | Status: DC | PRN

## 2014-02-25 MED ORDER — DEXTROSE 5 % IV SOLN
2.0000 g | Freq: Once | INTRAVENOUS | Status: AC
  Administered 2014-02-25: 2 g via INTRAVENOUS
  Filled 2014-02-25 (×2): qty 2

## 2014-02-25 MED ORDER — HEPARIN SODIUM (PORCINE) 1000 UNIT/ML DIALYSIS
1000.0000 [IU] | INTRAMUSCULAR | Status: DC | PRN
  Filled 2014-02-25: qty 1

## 2014-02-25 MED ORDER — ALTEPLASE 2 MG IJ SOLR
2.0000 mg | Freq: Once | INTRAMUSCULAR | Status: AC | PRN
  Filled 2014-02-25: qty 2

## 2014-02-25 MED ORDER — NEPRO/CARBSTEADY PO LIQD
237.0000 mL | ORAL | Status: DC | PRN
  Filled 2014-02-25: qty 237

## 2014-02-25 MED ORDER — LIDOCAINE-PRILOCAINE 2.5-2.5 % EX CREA
1.0000 "application " | TOPICAL_CREAM | CUTANEOUS | Status: DC | PRN
  Filled 2014-02-25: qty 5

## 2014-02-25 NOTE — Procedures (Signed)
I was present at this dialysis session, have reviewed the session itself and made  appropriate changes  [name] MD (pgr) [phone]    [phone] 02/25/2014, 1:29 PM

## 2014-02-25 NOTE — Progress Notes (Signed)
PULMONARY / CRITICAL CARE MEDICINE   Name: [name] [name] MRN: [id number] DOB: 12/10/1940    ADMISSION DATE:  02/24/2014 CONSULTATION DATE:  02/24/14  REFERRING MD :  ED PRIMARY SERVICE: PCCM  CHIEF COMPLAINT:  Unresponsive and cardiac arrest  BRIEF PATIENT DESCRIPTION: Patient is a 73 year old woman with a PMH of HTN, DM, PAD, CAD/MI with recent NSTEMI in March 2015, CHF with EF 30-35%, ESRD/HD MWF, COPD on home O2 2L, hx DVT and PE not on anticoagulant due to GIB, and Hypothyroidism.   Patient states that she has been fatigued for approximately 3 days, without clear precipitant at onset. She was noted to be unresponsive at Dialysis center and ? 2-10 mins CPR was performed (per EMS/ED note). Upon arrival to the ED, she was initially hemodynamically stable. Soon thereafter she became unresponsive, hypotensive and bradycardia. Her mental status quickly improved but restless after BVM and IVF. PCCM assumed care.   SIGNIFICANT EVENTS / STUDIES:  3/8   Hospitalized for NSTEMI, hypotension, UGIB and transaminitis  4/24 Unresponsive and received 2 minutes CPR at Dialysis center 4/24 Unresponsive, hypotensive and bradycardia at the ED and improved with BVM and IVF 4/24 CXR showed Pulmonary edema/infiltrates and RML/RLL opacities.  4/24 ABG showed acidemia with PH 7.219, PCO2 66.5, PO2 70, CO2 27 4/24 Bedside echo done and no pericardium effusion noted. Decreased LVEF 30-40%.  4/24 CVP inserted. CT angio of chest and DVT pending. PCCM assumed care   LINES / TUBES: 4/24 R Fem CVP>>> ? L IJ HD catheter>>>  CULTURES: 4/24 Blood>>> 4/24 Urine>>> 4/24 Respiratory>>> 4/24 lactic acid>>>1.88 4/24 PCT>>> 0.10  ANTIBIOTICS: 4/24 Vancomycin>>> 4/24 Ceftaz>>>  VITAL SIGNS: Temp:  [96.6 F (35.9 C)-98.4 F (36.9 C)] 98 F (36.7 C) (04/25 0800) Pulse Rate:  [85-107] 107 (04/25 0900) Resp:  [13-27] 18 (04/25 0900) BP: (79-215)/(40-118) 154/51 mmHg (04/25 0900) SpO2:  [75 %-100 %] 93 % (04/25  0900) FiO2 (%):  [50 %-100 %] 50 % (04/25 0736) Weight:  [141 lb 12.1 oz (64.3 kg)-150 lb 12.7 oz (68.4 kg)] 143 lb 4.8 oz (65 kg) (04/25 0500)  HEMODYNAMICS:   VENTILATOR SETTINGS: Vent Mode:  [-]  FiO2 (%):  [50 %-100 %] 50 % INTAKE / OUTPUT: Intake/Output     04/24 0701 - 04/25 0700 04/25 0701 - 04/26 0700   P.O. 120    I.V. (mL/kg) 270 (4.2) 10 (0.2)   IV Piggyback 550    Total Intake(mL/kg) 940 (14.5) 10 (0.2)   Urine (mL/kg/hr) 120    Other 3800    Total Output 3920     Net -2980 +10        Stool Occurrence 1 x     PHYSICAL EXAMINATION: General: No respiratory distress, remains on face mask. Neuro:  Alert and follow commands HEENT:  PERRL Cardiovascular:  RRR, No M/G/R Lungs: B/L lung sounds diminished. Rales noted. No wheezing Abdomen:  Soft. NTND, BS x 4 Musculoskeletal:  No edema Skin:  intact  LABS:  CBC  Recent Labs Lab 02/24/14 0740 02/25/14 0329  WBC 11.4* 11.3*  HGB 10.3* 11.8*  HCT 33.4* 37.8  PLT 149* 166   Coag's  Recent Labs Lab 02/24/14 0900  APTT 30  INR 0.96   BMET  Recent Labs Lab 02/24/14 0740 02/24/14 1429 02/25/14 0329  NA 141 142 138  K 3.2* 4.1 4.3  CL 111 98 98  CO2 16* 27 26  BUN 23 6 15   CREATININE 1.96* 0.95 2.04*  GLUCOSE 186*  104* 161*   Electrolytes  Recent Labs Lab 02/24/14 0740 02/24/14 0900 02/24/14 1429 02/25/14 0329  CALCIUM 5.1*  --  8.6 8.6  MG  --  1.8 1.8 1.8  PHOS  --  4.5 1.0* 2.9   Sepsis Markers  Recent Labs Lab 02/24/14 0900 02/24/14 1429 02/24/14 2025 02/25/14 0329  LATICACIDVEN 1.0 0.5 1.5 2.1  PROCALCITON <0.10  --   --   --    ABG  Recent Labs Lab 02/24/14 0752 02/24/14 0950 02/24/14 1335  PHART 7.198* 7.219* 7.266*  PCO2ART 66.8* 66.5* 58.3*  PO2ART 71.0* 70.0* 65.7*   Liver Enzymes  Recent Labs Lab 02/24/14 0740  AST 44*  ALT 25  ALKPHOS 61  BILITOT <0.2*  ALBUMIN 2.0*   Cardiac Enzymes  Recent Labs Lab 02/24/14 0900 02/24/14 1429 02/24/14 2025   TROPONINI <0.30 <0.30 <0.30  PROBNP 32267.0*  --   --    Glucose  Recent Labs Lab 02/24/14 1141 02/24/14 1602 02/24/14 2215 02/25/14 0806  GLUCAP 190* 111* 158* 126*    Imaging Dg Chest Port 1 View  02/25/2014   CLINICAL DATA:  Respiratory failure  EXAM: PORTABLE CHEST - 1 VIEW  COMPARISON:  Prior chest x-ray 02/24/2014  FINDINGS: Stable cardiomegaly. Left IJ approach split tip hemodialysis catheter with catheter tips at the superior cavoatrial junction and upper right atrium. Atherosclerotic calcifications in the transverse aorta. Increased pulmonary edema. Enlarging bilateral layering pleural effusions and associated bibasilar atelectasis. No acute osseous abnormality. No pneumothorax.  IMPRESSION: Developing pulmonary edema and enlarging bilateral layering effusions. Differential considerations include CHF and volume overload.   Electronically Signed   By: Malachy Moan M.D.   On: 02/25/2014 09:16   Dg Chest Port 1 View  02/24/2014   CLINICAL DATA:  post-arrest  EXAM: PORTABLE CHEST - 1 VIEW  COMPARISON:  DG CHEST 1V PORT dated 01/04/2014  FINDINGS: The cardiac silhouette is enlarged. There is diffuse prominence of interstitial markings, peribronchial cuffing, indistinctness of pulmonary vasculature. Areas of increased density in the lung bases. Small bilateral effusions left greater than right. No acute osseous abnormalities.  IMPRESSION: Pulmonary edema and small bilateral effusions. Areas of increased density within the lung bases consistent with areas of asymmetric edema versus atelectasis.   Electronically Signed   By: Salome Holmes M.D.   On: 02/24/2014 08:12   Dg Abd Portable 1v  02/24/2014   CLINICAL DATA:  confirm R fem line  EXAM: PORTABLE ABDOMEN - 1 VIEW  COMPARISON:  DG LUMBAR SPINE COMPLETE 4+V dated 01/16/2014; CT ABD/PELV WO CM dated 03/09/2013  FINDINGS: The bowel gas pattern is normal. Coarse vascular calcifications appreciated. Tip of a right femoral central venous  catheter is appreciated projecting in the region of the proximal femoral vein on the right. No acute osseous abnormalities.  IMPRESSION: Nonobstructive bowel gas pattern. Right femoral vein central venous catheter appreciated.   Electronically Signed   By: Salome Holmes M.D.   On: 02/24/2014 08:11   Dg Foot Complete Left  02/24/2014   CLINICAL DATA:  Left heel wound.  EXAM: LEFT FOOT - COMPLETE 3+ VIEW  COMPARISON:  Os calcis 04/19/2013.  FINDINGS: Skin wound is seen along the lateral margin of the calcaneus. No bony destructive change is seen although the calcaneus is not well visualized on this exam. Pes cavus deformity is noted.  IMPRESSION: Soft tissue wound lateral calcaneus. No plain film evidence of osteomyelitis although the calcaneus is poorly visualized.  Pes cavus.   Electronically Signed  By: Drusilla Kanner M.D.   On: 02/24/2014 20:42     ASSESSMENT / PLAN:  PULMONARY A: Acute respiratory failure, hypoxemic and hypercapnic Pulmonary edema in the setting of ESRD/HD, r/o cardiac source Pleural effusion ? HCAP COPD without exacerbation Hx of PE/DVT not on anticoagulant  P:   - Titrate O2 down to Felsenthal for sat of 88-92%. - DC BiPAP. - Pan cultures sent and pending. - LE Doppler>>>negative. - Duoneb. - Home inhaler. - Empiric Abx Vancomycin and Ceftaz. - Renal consult appreciated.  CARDIOVASCULAR A:  Sinus Bradycardia Recent NSTEMI Pulmonary edema Hx of hypotension, improved Hx of CHF, EF 30-35% in Jan 2015, managed with HD Hx of CAD/MI/PAD Hx of HTN Hx of HLD R/o PE, low suspciion P:  - Troponins are negative. - Echo done and pending. - KVO IVF. - Continue ASA, Zocor. - Hold home dose Midodrine given higher BP, will restart if BP drops. - Renal to pull fluid as BP tolerates.  RENAL A: ESRD with HD MWF  P:   - Follow BMP/Mg/Phos. - Replace Lytes as needed. - Renal consult appreciated.   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 - Home Protonix  HEMATOLOGIC A:   Anemia of chronic disease Hx of acute blood loss anemia, receive PRBC in March 2015 Lower ext dopplers negative.  P:  - Monitor CBC. - Heparin SQ.  INFECTIOUS A:   Leukocytosis ? HCAP R/o transient bacteremia  P:   - Follow CBC. - Pan cultures. - Empiric Abx Vanc and Ceftaz--> narrow pending cultures results.  ENDOCRINE A:   ? Relative adrenal insufficiency Hypothyroidism T2DM   P:   - TSH level noted. - Cortisol level noted. - Lantus dose decreased to 10, may need to increase that now is eating. - SSI - Continue Levothyroxine  NEUROLOGIC A:   Acute encephalopathy, improving  P:   - Monitor off sedation.   TODAY'S SUMMARY: BP improved, likely to have been respiratory driven syncope due to fluid overload.  Improved post dialysis.  Given negative troponins seriously doubt patient was truly in cardiac arrest.  Will order carotid dopplers and consult cards given history.  Move to SDU and give to [facility].  PCCM will sign off, please call back if needed.  I have personally obtained a history, examined the patient, evaluated laboratory and imaging results, formulated the assessment and plan and placed orders.  [name], M.D. [facility] Pulmonary/Critical Care Medicine. Pager: [phone]. After hours pager: [phone].

## 2014-02-25 NOTE — Progress Notes (Addendum)
*  PRELIMINARY RESULTS* Vascular Ultrasound Carotid Duplex (Doppler) has been completed.  Preliminary findings: Right = 1-39% ICA stenosis. Left = 80-99% ICA stenosis based on systolic velocity. 1-39 % ICA stenosis, based on diastolic velocity. 60-79% ICA stenosis, based on ratio.  (306/ 28 cm/s )   Glendale Chard RVT 02/25/2014, 10:20 AM

## 2014-02-25 NOTE — Progress Notes (Signed)
ANTIBIOTIC CONSULT NOTE - INITIAL  Pharmacy Consult for Vancomycin, Ceftazidime  Indication: rule out sepsis/bacteremia  Allergies  Allergen Reactions  . Procaine Hcl Nausea And Vomiting and Other (See Comments)    [facility] has reaction of "comatose"    Patient Measurements: Height: 5\' 5"  (165.1 cm) Weight: 143 lb 4.8 oz (65 kg) IBW/kg (Calculated) : 57  Vital Signs: Temp: 98 F (36.7 C) (04/25 0800) Temp src: Oral (04/25 0800) BP: 154/51 mmHg (04/25 0900) Pulse Rate: 107 (04/25 0900) Intake/Output from previous day: 04/24 0701 - 04/25 0700 In: 940 [P.O.:120; I.V.:270; IV Piggyback:550] Out: 3920 [Urine:120] Intake/Output from this shift: Total I/O In: 10 [I.V.:10] Out: -   Labs:  Recent Labs  02/24/14 0740 02/24/14 1429 02/25/14 0329  WBC 11.4*  --  11.3*  HGB 10.3*  --  11.8*  PLT 149*  --  166  CREATININE 1.96* 0.95 2.04*   Estimated Creatinine Clearance: 23.1 ml/min (by C-G formula based on Cr of 2.04). No results found for this basename: VANCOTROUGH, Leodis Binet, VANCORANDOM, GENTTROUGH, GENTPEAK, GENTRANDOM, TOBRATROUGH, TOBRAPEAK, TOBRARND, AMIKACINPEAK, AMIKACINTROU, AMIKACIN,  in the last 72 hours   Microbiology: Recent Results (from the past 720 hour(s))  CULTURE, BLOOD (ROUTINE X 2)     Status: None   Collection Time    02/24/14  9:00 AM      Result Value Ref Range Status   Specimen Description BLOOD PICC LINE   Final   Special Requests BOTTLES DRAWN AEROBIC AND ANAEROBIC   Final   Culture  Setup Time     Final   Value: 02/24/2014 13:47     Performed at [facility]   Culture     Final   Value: GRAM POSITIVE COCCI IN CLUSTERS     Note: Gram Stain Report Called to,Read Back By and Verified With: SYLVIA PILAND 02/25/14 @ 10:17AM BY RUSCOE A.     Performed at [facility]   Report Status PENDING   Incomplete  MRSA PCR SCREENING     Status: None   Collection Time    02/24/14 10:21 AM      Result Value Ref Range  Status   MRSA by PCR NEGATIVE  NEGATIVE Final   Comment:            The GeneXpert MRSA Assay (FDA     approved for NASAL specimens     only), is one component of a     comprehensive MRSA colonization     surveillance program. It is not     intended to diagnose MRSA     infection nor to guide or     monitor treatment for     MRSA infections.    Medical History: Past Medical History  Diagnosis Date  . ESRD on hemodialysis     Started HD in December 2013.  Cause of ESRD unknown.  Gets HD in [place] on MWF schedule. She had a right upper arm access which failed just short of one year.  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  She says she has "poor circulation" in the legs so they won't do a leg access. She remains catheter dependent as of   . Hypothyroidism   . Colitis 10/2011    C diff.   . Coronary artery disease     hx angina  . Hypertension   . GERD (gastroesophageal reflux disease)   .  Myocardial infarction   . Peripheral vascular disease     claudication.   Marland Kitchen. Hx of pulmonary embolus   . COPD (chronic obstructive pulmonary disease)     Nocturnal O2.   . Shortness of breath     uses O2 as needed  . Critical lower limb ischemia   . DVT (deep venous thrombosis) 2008    "right"  . Pneumonia     "several times"  . Type II diabetes mellitus     insulin requiring.   . CHF (congestive heart failure)     Volume mgt with HD  . Hypercholesteremia     Medications:  Prescriptions prior to admission  Medication Sig Dispense Refill  . albuterol (PROVENTIL) (5 MG/ML) 0.5% nebulizer solution Take 2.5 mg by nebulization 2 (two) times daily.      Marland Kitchen. aspirin EC 81 MG tablet Take 81 mg by mouth daily.      . calcium acetate (PHOSLO) 667 MG capsule Take 667 mg by mouth 3 (three) times daily with meals.      Marland Kitchen. guaiFENesin (MUCINEX) 600 MG 12 hr tablet Take 1 tablet (600 mg total) by mouth 2 (two) times  daily as needed for cough.  45 tablet  0  . insulin aspart (NOVOLOG) 100 UNIT/ML injection Inject 2 Units into the skin 3 (three) times daily with meals.  10 mL  1  . insulin glargine (LANTUS) 100 UNIT/ML injection Inject 0.17 mLs (17 Units total) into the skin at bedtime.  10 mL  1  . levothyroxine (SYNTHROID, LEVOTHROID) 25 MCG tablet Take 25 mcg by mouth daily.       . midodrine (PROAMATINE) 5 MG tablet Take 5 mg bid on non-dialysis days.  Take 10 mg bid on dialysis days.  90 tablet  0  . multivitamin (RENA-VIT) TABS tablet Take 1 tablet by mouth at bedtime.  30 tablet  0  . nitroGLYCERIN (NITROSTAT) 0.4 MG SL tablet Place 0.4 mg under the tongue every 5 (five) minutes as needed. For chest pain.      Marland Kitchen. NOVOLIN R RELION 100 UNIT/ML injection Inject 6-10 Units into the skin 2 (two) times daily. If cbg 300 or 400 use 10 units, if cbg 200 use 6 unit, if cbg 160 use 2 units. Sliding scale      . pantoprazole (PROTONIX) 40 MG tablet Take 40 mg by mouth daily.       . Probiotic Product (PROBIOTIC PO) Take 1 capsule by mouth daily as needed (digestion).       . simvastatin (ZOCOR) 80 MG tablet Take 1 tablet (80 mg total) by mouth every evening.  30 tablet  0  . tiotropium (SPIRIVA) 18 MCG inhalation capsule Place 1 capsule (18 mcg total) into inhaler and inhale daily.  30 capsule  12   Assessment: [age] YOF with PMH of ESRD on MWF HD presented 4/24 from dialysis after an episode of possible cardiac arrest. Her last dialysis was 2 days ago. Pharmacy has been consulted to manage ceftazidime and vancomycin therapy. Gram positive cocci in clusters identified in cultures this morning.    WBC mildly elevated at 11.3. LA 2.1. PCT < 0.1. She remains afebrile.   Cultures: 4/24 Blood Cx x2>> GPCs in clusters 4/24 Urine Cx >>  4/24 Trach aspirate>>   Both of these antibiotics require special dosing in HD patients. Until patient returns to a consistent HD schedule, we will enter in doses daily on the days she  receives HD. Per nephrology's  note today, it appears patient will likely be back to baseline after she receives HD today. Therefore, would anticipate patient to return to MWF schedule on Monday. Will continue to follow HD schedule and enter doses appropriately.  Goal of Therapy:  Vancomycin trough level 15-20 mcg/ml Resolution of infection  Plan:  1) Follow up with HD schedule to dose antibiotics  -ceftazidime 2 gm IV on HD days   -vancomycin 750mg  IV q HD 2) Monitor CBC, cultures, and patient's clinical progress 3) Collect vancomycin levels as indicated   Kerrilynn Derenzo C. Shaundra Fullam, PharmD Clinical Pharmacist-Resident Pager: [phone] Pharmacy: [phone] 02/25/2014 11:12 AM

## 2014-02-25 NOTE — Progress Notes (Signed)
Pharmacist Heart Failure Core Measure Documentation  Assessment: [name] [name] has an EF documented as 30 - 35% on 02/24/14.  Rationale: Heart failure patients with left ventricular systolic dysfunction (LVSD) and an EF < 40% should be prescribed an angiotensin converting enzyme inhibitor (ACEI) or angiotensin receptor blocker (ARB) at discharge unless a contraindication is documented in the medical record.  This patient is not currently on an ACEI or ARB for HF.  This note is being placed in the record in order to provide documentation that a contraindication to the use of these agents is present for this encounter.  ACE Inhibitor or Angiotensin Receptor Blocker is contraindicated (specify all that apply)  []   ACEI allergy AND ARB allergy []   Angioedema []   Moderate or severe aortic stenosis []   Hyperkalemia [x]   Hypotension []   Renal artery stenosis [x]   Worsening renal function, preexisting renal disease or dysfunction   Patient is currently on midodrine PTA for regular hypotension. Currently BP high on admission with HD and labile. May consider ACEI (dialyzable) if stable before discharge.   Tyrone Nine. Artelia Laroche, PharmD Clinical Pharmacist - Resident Phone: [phone] Pager: [phone] 02/25/2014 11:46 AM

## 2014-02-25 NOTE — Progress Notes (Signed)
  [facility] Progress Note   Subjective: Breathing much better  Filed Vitals:   02/25/14 0736 02/25/14 0800 02/25/14 0815 02/25/14 0900  BP: 167/69 184/76 176/69 154/51  Pulse: 99 103 105 107  Temp:  98 F (36.7 C)    TempSrc:  Oral    Resp: 19 20 17 18   Height:      Weight:      SpO2: 97% 95% 98% 93%  Exam: Alert, facial contusions No jvd Chest clear bilat, no wheezing RRR no MRG, tachy Abd soft, obese, NTND No LE or UE edema  Dialysis: MWF [facility]  4h  64kg  F160   2/2.5 [place]   Heparin none   Prof 4   L IJ catheter  Hectorol 2    EPO none  Labs: ferr 1559, tsat 47%, phos 3.5, pth 257   Assessment:  1 Acute on chronic resp failure / pulm edema- resolved, needs more vol off w another dialysis today then should be to baseline 2 ESRD on HD  3 Chronic resp failure on home O2  4 Hx CAD/MI  5 DM2  6 Anemia not on epo 7 MBD cont binders  8 Hypertension- BP's high her, holding midodrine, BP better today 9 Hypocalcemia- agree with replacing IV for now  Plan =- HD today upstairs, UF 3kg as tolerated   [name] MD  pager [phone]    cell [phone]  02/25/2014, 9:39 AM     Recent Labs Lab 02/24/14 0740 02/24/14 0900 02/24/14 1429 02/25/14 0329  NA 141  --  142 138  K 3.2*  --  4.1 4.3  CL 111  --  98 98  CO2 16*  --  27 26  GLUCOSE 186*  --  104* 161*  BUN 23  --  6 15  CREATININE 1.96*  --  0.95 2.04*  CALCIUM 5.1*  --  8.6 8.6  PHOS  --  4.5 1.0* 2.9    Recent Labs Lab 02/24/14 0740  AST 44*  ALT 25  ALKPHOS 61  BILITOT <0.2*  PROT 3.9*  ALBUMIN 2.0*    Recent Labs Lab 02/24/14 0740 02/25/14 0329  WBC 11.4* 11.3*  NEUTROABS 9.6* 9.7*  HGB 10.3* 11.8*  HCT 33.4* 37.8  MCV 97.9 94.5  PLT 149* 166   . aspirin EC  81 mg Oral Daily  . calcium acetate  667 mg Oral TID WC  . collagenase   Topical Daily  . heparin subcutaneous  5,000 Units Subcutaneous 3 times per day  . insulin glargine  10 Units Subcutaneous QHS  .  ipratropium-albuterol  3 mL Nebulization Q6H  . levothyroxine  25 mcg Oral Daily  . pantoprazole  40 mg Oral Daily  . simvastatin  80 mg Oral QPM   . sodium chloride 10 mL/hr at 02/25/14 0800  . sodium chloride Stopped (02/24/14 2000)   sodium chloride, sodium chloride, sodium chloride, feeding supplement (NEPRO CARB STEADY), heparin, lidocaine (PF), lidocaine-prilocaine, pentafluoroprop-tetrafluoroeth

## 2014-02-26 DIAGNOSIS — Z86711 Personal history of pulmonary embolism: Secondary | ICD-10-CM

## 2014-02-26 DIAGNOSIS — I10 Essential (primary) hypertension: Secondary | ICD-10-CM

## 2014-02-26 DIAGNOSIS — I5020 Unspecified systolic (congestive) heart failure: Secondary | ICD-10-CM

## 2014-02-26 DIAGNOSIS — I2510 Atherosclerotic heart disease of native coronary artery without angina pectoris: Secondary | ICD-10-CM

## 2014-02-26 DIAGNOSIS — I959 Hypotension, unspecified: Secondary | ICD-10-CM

## 2014-02-26 DIAGNOSIS — E119 Type 2 diabetes mellitus without complications: Secondary | ICD-10-CM

## 2014-02-26 DIAGNOSIS — E039 Hypothyroidism, unspecified: Secondary | ICD-10-CM

## 2014-02-26 DIAGNOSIS — K922 Gastrointestinal hemorrhage, unspecified: Secondary | ICD-10-CM

## 2014-02-26 LAB — CBC WITH DIFFERENTIAL/PLATELET
BASOS ABS: 0.1 10*3/uL (ref 0.0–0.1)
BASOS PCT: 1 % (ref 0–1)
EOS ABS: 0.1 10*3/uL (ref 0.0–0.7)
Eosinophils Relative: 1 % (ref 0–5)
HCT: 36.6 % (ref 36.0–46.0)
Hemoglobin: 11.6 g/dL — ABNORMAL LOW (ref 12.0–15.0)
Lymphocytes Relative: 13 % (ref 12–46)
Lymphs Abs: 1.3 10*3/uL (ref 0.7–4.0)
MCH: 29.7 pg (ref 26.0–34.0)
MCHC: 31.7 g/dL (ref 30.0–36.0)
MCV: 93.6 fL (ref 78.0–100.0)
MONOS PCT: 7 % (ref 3–12)
Monocytes Absolute: 0.8 10*3/uL (ref 0.1–1.0)
NEUTROS ABS: 7.9 10*3/uL — AB (ref 1.7–7.7)
Neutrophils Relative %: 78 % — ABNORMAL HIGH (ref 43–77)
PLATELETS: 168 10*3/uL (ref 150–400)
RBC: 3.91 MIL/uL (ref 3.87–5.11)
RDW: 16.7 % — AB (ref 11.5–15.5)
WBC: 10.1 10*3/uL (ref 4.0–10.5)

## 2014-02-26 LAB — URINE CULTURE

## 2014-02-26 LAB — BASIC METABOLIC PANEL
BUN: 15 mg/dL (ref 6–23)
CO2: 25 mEq/L (ref 19–32)
Calcium: 9 mg/dL (ref 8.4–10.5)
Chloride: 95 mEq/L — ABNORMAL LOW (ref 96–112)
Creatinine, Ser: 2.05 mg/dL — ABNORMAL HIGH (ref 0.50–1.10)
GFR calc Af Amer: 27 mL/min — ABNORMAL LOW (ref >90)
GFR, EST NON AFRICAN AMERICAN: 23 mL/min — AB (ref >90)
Glucose, Bld: 177 mg/dL — ABNORMAL HIGH (ref 70–99)
Potassium: 3.7 mEq/L (ref 3.7–5.3)
SODIUM: 135 meq/L — AB (ref 137–147)

## 2014-02-26 LAB — PHOSPHORUS: Phosphorus: 1.5 mg/dL — ABNORMAL LOW (ref 2.3–4.6)

## 2014-02-26 LAB — GLUCOSE, CAPILLARY: Glucose-Capillary: 230 mg/dL — ABNORMAL HIGH (ref 70–99)

## 2014-02-26 LAB — LACTIC ACID, PLASMA
LACTIC ACID, VENOUS: 1.8 mmol/L (ref 0.5–2.2)
Lactic Acid, Venous: 1.1 mmol/L (ref 0.5–2.2)
Lactic Acid, Venous: 1.2 mmol/L (ref 0.5–2.2)
Lactic Acid, Venous: 1 mmol/L (ref 0.5–2.2)

## 2014-02-26 LAB — MAGNESIUM: MAGNESIUM: 1.8 mg/dL (ref 1.5–2.5)

## 2014-02-26 MED ORDER — VANCOMYCIN HCL IN DEXTROSE 750-5 MG/150ML-% IV SOLN
750.0000 mg | INTRAVENOUS | Status: DC
  Filled 2014-02-26: qty 150

## 2014-02-26 MED ORDER — INSULIN ASPART 100 UNIT/ML ~~LOC~~ SOLN
0.0000 [IU] | SUBCUTANEOUS | Status: DC
  Administered 2014-02-26: 5 [IU] via SUBCUTANEOUS

## 2014-02-26 MED ORDER — DEXTROSE 5 % IV SOLN
2.0000 g | INTRAVENOUS | Status: DC
  Filled 2014-02-26: qty 2

## 2014-02-26 MED ORDER — SODIUM PHOSPHATE 3 MMOLE/ML IV SOLN
30.0000 mmol | Freq: Once | INTRAVENOUS | Status: AC
  Administered 2014-02-26: 30 mmol via INTRAVENOUS
  Filled 2014-02-26: qty 10

## 2014-02-26 MED ORDER — CARVEDILOL 6.25 MG PO TABS
6.2500 mg | ORAL_TABLET | Freq: Two times a day (BID) | ORAL | Status: DC
  Administered 2014-02-26: 6.25 mg via ORAL
  Filled 2014-02-26 (×4): qty 1

## 2014-02-26 MED ORDER — ISOSORBIDE MONONITRATE ER 30 MG PO TB24
30.0000 mg | ORAL_TABLET | Freq: Every day | ORAL | Status: DC
  Administered 2014-02-26 – 2014-02-27 (×2): 30 mg via ORAL
  Filled 2014-02-26 (×3): qty 1

## 2014-02-26 MED ORDER — MIDODRINE HCL 5 MG PO TABS
10.0000 mg | ORAL_TABLET | ORAL | Status: DC
  Filled 2014-02-26: qty 2

## 2014-02-26 NOTE — Evaluation (Signed)
Physical Therapy Evaluation Patient Details Name: [name] [name] MRN: [id number] DOB: Nov 05, 1940 Today's Date: 02/26/2014   History of Present Illness  Patient is a 73 year old woman with a PMH of HTN, DM, PAD, CAD/MI with recent NSTEMI in March 2015, CHF with EF 30-35%, ESRD/HD MWF, COPD on home O2 2L, hx DVT and PE not on anticoagulant due to GIB, and Hypothyroidism. Patient states that she has been fatigued for approximately 3 days, without clear precipitant at onset. She was noted to be unresponsive at Dialysis center and ? 2-10 mins CPR was performed (per EMS/ED note). Upon arrival to the ED, she was initially hemodynamically stable. Soon thereafter she became unresponsive, hypotensive and bradycardia. Her mental status quickly improved but restless after BVM (bag valave mask) and IVF. As of 4/25, BP improved, likely to have been respiratory driven syncope due to fluid overload. Improved post dialysis. Given negative troponins seriously doubt patient was truly in cardiac arrest. Will order carotid dopplers and consult cards given history. Move to SDU and give to [facility].  Clinical Impression   Pt admitted with above. Pt currently with functional limitations due to the deficits listed below (see PT Problem List). Was independent prior to this hospitalization;  Pt will benefit from skilled PT to increase their independence and safety with mobility to allow discharge to the venue listed below.      Follow Up Recommendations SNF Hopefully pt will progress quickly and be able to safely dc home independently; But will need to consider post acute rehab if slow progress    Equipment Recommendations  Rolling walker with 5" wheels;3in1 (PT)    Recommendations for Other Services OT consult     Precautions / Restrictions Precautions Precautions: Fall;Other (comment) Precaution Comments: watch O2 sats closely      Mobility  Bed Mobility Overal bed mobility: Needs Assistance Bed Mobility: Supine to  Sit;Sit to Supine     Supine to sit: Min guard Sit to supine: Min guard   General bed mobility comments: Inefficient movement with valsava-like breath-holding and pulling on rails to get up; Cues to breathe; O2 sats decr to 77% on 5 L, incr to 6 and sats still low; RN joined [place], and we ultimately decided to lay back down  Transfers Overall transfer level: Needs assistance Equipment used: 1 person hand held assist Transfers: Sit to/from Stand Sit to Stand: Mod assist         General transfer comment: Bilateral UE support/physical assist to power-up, then took 2 small steps toward [facility], then sat down  Ambulation/Gait Ambulation/Gait assistance: Min assist Ambulation Distance (Feet): 2 Feet (sidesteps to [facility]) Assistive device: 1 person hand held assist       General Gait Details: Cues for direction; had to sit back down relatively quickly due to decr O2 sats  Stairs            Wheelchair Mobility    Modified Rankin (Stroke Patients Only)       Balance                                             Pertinent Vitals/Pain Session conducted on supplemental O2; Oxygen saturation decr to in the mid 70s (with good wave on monitor); so ended session and assisted pt back to bed; RN aware    Home Living Family/patient expects to be discharged to:: Private residence  Living Arrangements: Children Available Help at Discharge: Family;Available PRN/intermittently (reports son works nights) Type of Home: House Home Access: Ramped entrance     Home Layout: One level Home Equipment: Bedside commode;Shower seat;Cane - single point Additional Comments: Has dialysis M,W,F    Prior Function Level of Independence: Independent         Comments: JRs transportation takes pt to dialysis MWF     Hand Dominance   Dominant Hand: Right    Extremity/Trunk Assessment   Upper Extremity Assessment: Overall WFL for tasks assessed           Lower Extremity  Assessment: Generalized weakness         Communication   Communication: No difficulties  Cognition Arousal/Alertness: Awake/alert Behavior During Therapy: WFL for tasks assessed/performed Overall Cognitive Status: Within Functional Limits for tasks assessed                      General Comments      Exercises        Assessment/Plan    PT Assessment Patient needs continued PT services  PT Diagnosis Difficulty walking;Generalized weakness   PT Problem List Decreased strength;Decreased range of motion;Decreased activity tolerance;Decreased balance;Decreased mobility;Decreased knowledge of use of DME;Decreased knowledge of precautions;Impaired sensation  PT Treatment Interventions DME instruction;Gait training;Functional mobility training;Therapeutic activities;Therapeutic exercise;Patient/family education;Balance training   PT Goals (Current goals can be found in the Care Plan section) Acute Rehab PT Goals Patient Stated Goal: Did not state PT Goal Formulation: With patient Time For Goal Achievement: 03/12/14 Potential to Achieve Goals: Good    Frequency Min 3X/week   Barriers to discharge Decreased caregiver support Will need to be mod I in order to dc home; Son works nights    Co-evaluation               End of Journalist, newspaperession Equipment Utilized During Treatment: Oxygen Activity Tolerance: Other (comment) (limited by decr O2 sats) Patient left: in bed;with call bell/phone within reach;with nursing/sitter in room Nurse Communication: Mobility status;Other (comment) (significant decr in O2 satsw with activity)         Time: [id number] PT Time Calculation (min): 12 min   Charges:   PT Evaluation $Initial PT Evaluation Tier I: 1 Procedure     PT G Codes:          [facility] [place] 02/26/2014, 9:51 AM Van ClinesHolly Deshawna Mcneece, PT  Acute Rehabilitation Services Pager [phone] Office [phone]

## 2014-02-26 NOTE — Progress Notes (Signed)
[facility] TEAM 1 - Stepdown/ICU TEAM Progress Note  Arna SnipeJudy A Monie UJW:[id number] DOB: 12/21/1940 DOA: 02/24/2014 PCP: [name],[name], MD  Admit HPI / Brief Narrative: 73 year old WF PMHx  HTN, DM, PAD, CAD/MI with recent NSTEMI in March 2015, CHF with EF 30-35%, ESRD/HD MWF, COPD on home O2 2L, hx DVT and PE not on anticoagulant due to GIB, and Hypothyroidism.  Patient states that she has been fatigued for approximately 3 days, without clear precipitant at onset. She was noted to be unresponsive at Dialysis center and ? 2-10 mins CPR was performed (per EMS/ED note). Upon arrival to the ED, she was initially hemodynamically stable. Soon thereafter she became unresponsive, hypotensive and bradycardia. Her mental status quickly improved but restless after BVM and IVF. PCCM assumed care.    HPI/Subjective:   Assessment/Plan:  Acute Respiratory Failure -resolving etiology unclear, Possibly secondary to decompensated CHF. Patient's BNP is 32,267, Compared to BNP 11/07/2013 which was 22,434 not significantly increased, but may be unknown to push this patient over the edge who has multiple other medical problems. -patient received HD which did improve her pulmonary function.  Pulmonary Edema/ ESRD on HD M/W/F -continues to require 6 L of O2 which is 3 times her home regimen. -patient should receive HD in the a.m.  Systolic CHF -patient not on CHF regimen start Coreg 6.25 BID -Start Imdur 30 mg daily -would hold on starting ACE/ARB secondary to patient's ESRD -Consult cardiology in a.m.   HTN -see systolic CHF  Sinus Bradycardia -resolved  Recent NSTEMI  -on this admission does not appear to have had an additional NSTEMI. 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 Relative adrenal insufficiency -questionable adrenal insufficiency we'll obtain a.m. Cortisol level  DM Type 2 -obtain hemoglobin A1c -continue Lantus 10 units daily -moderate SSI       Code Status: FULL Family Communication: no family present at time of exam Disposition Plan: resolution of fluid overload    Consultants: -Dr. Delano Metzobert Schertz (nephrology)    Procedure/Significant Events: Carotid Doppler 12/28/2013  -Right = 1-39% ICA stenosis. - Left = 80-99% ICA stenosis based on systolic velocity. 1-39 % based diastolic velocity. 60-79%   02/24/2014 echocardiogram - Left ventricle: The cavity size was normal. Wall thickness was normal.  -LVEF= 30% to 35%. Severe hypokinesis of the entire myocardium. Akinesis of the apical myocardium. - Aortic valve: Trivial regurgitation. - Right ventricle: cavity size was mildly dilated.Systolic function moderately reduced. - Pericardium, extracardiac: A trivial pericardial effusion  PCXR 02/25/2014  -Developing pulmonary edema and enlarging bilateral layering effusions.  Bilateral lower extremity Doppler 02/25/2012 No evidence DVT   4/24 CVP inserted.  4/24 Bedside echo done and no pericardium effusion noted. Decreased LVEF 30-40%. 4/24 ABG showed acidemia with PH 7.219, PCO2 66.5, PO2 70, CO2 27 4/24 CXR showed Pulmonary edema/infiltrates and RML/RLL opacities 4/24 Unresponsive, hypotensive and bradycardia at the ED and improved with BVM and IVF 4/24 Unresponsive and received 2 minutes CPR at Dialysis center 3/8 Hospitalized for NSTEMI, hypotension, UGIB and transaminitis      .         Culture 4/24 Blood>>>  4/24 Urine>>>  4/24 Respiratory>>>  4/24 lactic acid>>>1.88  4/24 PCT>>> 0.10  Antibiotics: 4/24 Vancomycin>>>  4/24 Ceftaz>>>   DVT prophylaxis: Heparin TID    Devices   LINES / TUBES:  4/24 R Fem CVP>>>  ? L IJ HD  catheter>>>    Continuous Infusions: . sodium chloride 10 mL/hr at 02/25/14 0800  . sodium chloride Stopped (02/24/14 2000)    Objective: VITAL SIGNS: Temp: 99.1 F (37.3 C) (04/26 0000) Temp src: Oral (04/26 0000) BP: 166/130 mmHg (04/26 0600) Pulse Rate: 95 (04/26 0600) SPO2;89% on 6 L via nasal cannula FIO2:   Intake/Output Summary (Last 24 hours) at 02/26/14 0727 Last data filed at 02/26/14 0530  Gross per 24 hour  Intake   1040 ml  Output   2385 ml  Net  -1345 ml     Exam: General: A./O. X4, NAD No acute respiratory distress Lungs: diffuse Rales, without wheezes or crackles Cardiovascular: tachycardic,Regular rhythm without murmur gallop or rub normal S1 and S2 Renalbalance today;        /overall;        Creatinine ;  2.05      Hourly output   Abdomen: Nontender, nondistended, soft, bowel sounds positive, no rebound, no ascites, no appreciable mass Extremities: No significant cyanosis, clubbing, or edema bilateral lower extremities  Data Reviewed: Basic Metabolic Panel:  Recent Labs Lab 02/24/14 0740 02/24/14 0900 02/24/14 1429 02/25/14 0329 02/26/14 0335  NA 141  --  142 138 135*  K 3.2*  --  4.1 4.3 3.7  CL 111  --  98 98 95*  CO2 16*  --  27 26 25   GLUCOSE 186*  --  104* 161* 177*  BUN 23  --  6 15 15   CREATININE 1.96*  --  0.95 2.04* 2.05*  CALCIUM 5.1*  --  8.6 8.6 9.0  MG  --  1.8 1.8 1.8 1.8  PHOS  --  4.5 1.0* 2.9 1.5*   Liver Function Tests:  Recent Labs Lab 02/24/14 0740  AST 44*  ALT 25  ALKPHOS 61  BILITOT <0.2*  PROT 3.9*  ALBUMIN 2.0*    Recent Labs Lab 02/24/14 0900  LIPASE 23   No results found for this basename: AMMONIA,  in the last 168 hours CBC:  Recent Labs Lab 02/24/14 0740 02/25/14 0329 02/26/14 0335  WBC 11.4* 11.3* 10.1  NEUTROABS 9.6* 9.7* 7.9*  HGB 10.3* 11.8* 11.6*  HCT 33.4* 37.8 36.6  MCV 97.9 94.5 93.6  PLT 149* 166 168   Cardiac Enzymes:  Recent Labs Lab 02/24/14 0740 02/24/14 0900  02/24/14 1429 02/24/14 2025  TROPONINI <0.30 <0.30 <0.30 <0.30   BNP (last 3 results)  Recent Labs  10/26/13 0510 11/07/13 0455 02/24/14 0900  PROBNP 9552.0* 22434.0* 32267.0*   CBG:  Recent Labs Lab 02/24/14 1602 02/24/14 2215 02/25/14 0806 02/25/14 1119 02/25/14 2221  GLUCAP 111* 158* 126* 243* 198*    Recent Results (from the past 240 hour(s))  CULTURE, BLOOD (ROUTINE X 2)     Status: None   Collection Time    02/24/14  9:00 AM      Result Value Ref Range Status   Specimen Description BLOOD PMarlaMarland KMaMarland [id number] MarlandMarland Marland KitchMarland Kitch158Marland [id number](31MarlaMarland [phone]<1MaiRJo(21<MEASUREMENTEgbert GaribaldSchleicher County Medical Center)Ar artners   Culture     Final   Value: GRAM POSITIVE COCCI IN CLUSTERS     Note: Gram Stain Report Called to,Read  Back By and Verified With: SYLVIA PILAND 02/25/14 @ 10:17AM BY RUSCOE A.     Performed at [facility]   Report Status PENDING   Incomplete  CULTURE, BLOOD (ROUTINE X 2)     Status: None   Collection Time    02/24/14  9:55 AM      Result Value Ref Range Status   Specimen Description BLOOD HAND LEFT   Final   Special Requests BOTTLES DRAWN AEROBIC ONLY   Final   Culture  Setup Time     Final   Value: 02/24/2014 14:12     Performed at [facility]   Culture     Final   Value:        BLOOD CULTURE RECEIVED NO GROWTH TO DATE CULTURE WILL BE HELD FOR 5 DAYS BEFORE ISSUING A FINAL NEGATIVE REPORT     Performed at [facility]   Report Status PENDING   Incomplete  MRSA PCR SCREENING     Status: None   Collection Time    02/24/14 10:21 AM      Result Value Ref Range Status   MRSA by PCR NEGATIVE  NEGATIVE Final   Comment:            The GeneXpert MRSA Assay (FDA     approved for NASAL specimens     only), is one component of a     comprehensive MRSA colonization     surveillance program. It is not     intended to diagnose MRSA     infection nor to guide or      monitor treatment for     MRSA infections.  URINE CULTURE     Status: None   Collection Time    02/24/14  4:28 PM      Result Value Ref Range Status   Specimen Description URINE, RANDOM   Final   Special Requests NONE   Final   Culture  Setup Time     Final   Value: 02/24/2014 20:32     Performed at [facility]   Colony Count     Final   Value: 3,000 COLONIES/ML     Performed at [facility]   Culture     Final   Value: INSIGNIFICANT GROWTH     Performed at [facility]   Report Status 02/26/2014 FINAL   Final     Studies:  Recent x-ray studies have been reviewed in detail by the Attending Physician  Scheduled Meds:  Scheduled Meds: . aspirin EC  81 mg Oral Daily  . calcium acetate  667 mg Oral TID WC  . collagenase   Topical Daily  . heparin subcutaneous  5,000 Units Subcutaneous 3 times per day  . insulin glargine  10 Units Subcutaneous QHS  . ipratropium-albuterol  3 mL Nebulization Q6H  . levothyroxine  25 mcg Oral Daily  . pantoprazole  40 mg Oral Daily  . simvastatin  80 mg Oral QPM  . sodium phosphate  Dextrose 5% IVPB  30 mmol Intravenous Once    Time spent on care of this patient: 40 mins   Drema Dallas , MD   [facility] Office  [phone] Pager [phone]  On-Call/Text Page:      [email or web address]      password [email or web address]  If 7PM-7AM, please contact night-coverage [email or web address] Password [email or web address] 02/26/2014, 7:27 AM   LOS: 2 days

## 2014-02-26 NOTE — Progress Notes (Signed)
ANTIBIOTIC CONSULT NOTE - INITIAL  Pharmacy Consult for Vancomycin, Ceftazidime  Indication: rule out sepsis/bacteremia  Allergies  Allergen Reactions  . Procaine Hcl Nausea And Vomiting and Other (See Comments)    [facility] has reaction of "comatose"    Patient Measurements: Height: 5\' 5"  (165.1 cm) Weight: 140 lb 6.9 oz (63.7 kg) IBW/kg (Calculated) : 57  Vital Signs: Temp: 97.7 F (36.5 C) (04/26 1204) Temp src: Oral (04/26 1204) BP: 145/52 mmHg (04/26 1204) Pulse Rate: 100 (04/26 1204) Intake/Output from previous day: 04/25 0701 - 04/26 0700 In: 1040 [P.O.:720; I.V.:10; IV Piggyback:310] Out: 2385  Intake/Output from this shift: Total I/O In: 240 [P.O.:240] Out: -   Labs:  Recent Labs  02/24/14 0740 02/24/14 1429 02/25/14 0329 02/26/14 0335  WBC 11.4*  --  11.3* 10.1  HGB 10.3*  --  11.8* 11.6*  PLT 149*  --  166 168  CREATININE 1.96* 0.95 2.04* 2.05*   Estimated Creatinine Clearance: 23 ml/min (by C-G formula based on Cr of 2.05). No results found for this basename: VANCOTROUGH, Leodis Binet, VANCORANDOM, GENTTROUGH, GENTPEAK, GENTRANDOM, TOBRATROUGH, TOBRAPEAK, TOBRARND, AMIKACINPEAK, AMIKACINTROU, AMIKACIN,  in the last 72 hours   Microbiology: Recent Results (from the past 720 hour(s))  CULTURE, BLOOD (ROUTINE X 2)     Status: None   Collection Time    02/24/14  9:00 AM      Result Value Ref Range Status   Specimen Description BLOOD PICC LINE   Final   Special Requests BOTTLES DRAWN AEROBIC AND ANAEROBIC   Final   Culture  Setup Time     Final   Value: 02/24/2014 13:47     Performed at [facility]   Culture     Final   Value: STAPHYLOCOCCUS SPECIES (COAGULASE NEGATIVE)     Note: Gram Stain Report Called to,Read Back By and Verified With: SYLVIA PILAND 02/25/14 @ 10:17AM BY RUSCOE A.     Performed at [facility]   Report Status PENDING   Incomplete  CULTURE, BLOOD (ROUTINE X 2)     Status: None   Collection Time     02/24/14  9:55 AM      Result Value Ref Range Status   Specimen Description BLOOD HAND LEFT   Final   Special Requests BOTTLES DRAWN AEROBIC ONLY   Final   Culture  Setup Time     Final   Value: 02/24/2014 14:12     Performed at [facility]   Culture     Final   Value:        BLOOD CULTURE RECEIVED NO GROWTH TO DATE CULTURE WILL BE HELD FOR 5 DAYS BEFORE ISSUING A FINAL NEGATIVE REPORT     Performed at [facility]   Report Status PENDING   Incomplete  MRSA PCR SCREENING     Status: None   Collection Time    02/24/14 10:21 AM      Result Value Ref Range Status   MRSA by PCR NEGATIVE  NEGATIVE Final   Comment:            The GeneXpert MRSA Assay (FDA     approved for NASAL specimens     only), is one component of a     comprehensive MRSA colonization     surveillance program. It is not     intended to diagnose MRSA     infection nor to guide or     monitor treatment for  MRSA infections.  URINE CULTURE     Status: None   Collection Time    02/24/14  4:28 PM      Result Value Ref Range Status   Specimen Description URINE, RANDOM   Final   Special Requests NONE   Final   Culture  Setup Time     Final   Value: 02/24/2014 20:32     Performed at [facility]   Colony Count     Final   Value: 3,000 COLONIES/ML     Performed at [facility]   Culture     Final   Value: INSIGNIFICANT GROWTH     Performed at [facility]   Report Status 02/26/2014 FINAL   Final    Medical History: Past Medical History  Diagnosis Date  . ESRD on hemodialysis     Started HD in December 2013.  Cause of ESRD unknown.  Gets HD in [place] on MWF schedule. She had a right upper arm access which failed just short of one year.  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  She says she has "poor circulation" in the legs so they won't do a leg access. She remains catheter  dependent as of   . Hypothyroidism   . Colitis 10/2011    C diff.   . Coronary artery disease     hx angina  . Hypertension   . GERD (gastroesophageal reflux disease)   . Myocardial infarction   . Peripheral vascular disease     claudication.   Marland Kitchen Hx of pulmonary embolus   . COPD (chronic obstructive pulmonary disease)     Nocturnal O2.   . Shortness of breath     uses O2 as needed  . Critical lower limb ischemia   . DVT (deep venous thrombosis) 2008    "right"  . Pneumonia     "several times"  . Type II diabetes mellitus     insulin requiring.   . CHF (congestive heart failure)     Volume mgt with HD  . Hypercholesteremia     Medications:  Prescriptions prior to admission  Medication Sig Dispense Refill  . albuterol (PROVENTIL) (5 MG/ML) 0.5% nebulizer solution Take 2.5 mg by nebulization 2 (two) times daily.      Marland Kitchen aspirin EC 81 MG tablet Take 81 mg by mouth daily.      . calcium acetate (PHOSLO) 667 MG capsule Take 667 mg by mouth 3 (three) times daily with meals.      Marland Kitchen guaiFENesin (MUCINEX) 600 MG 12 hr tablet Take 1 tablet (600 mg total) by mouth 2 (two) times daily as needed for cough.  45 tablet  0  . insulin aspart (NOVOLOG) 100 UNIT/ML injection Inject 2 Units into the skin 3 (three) times daily with meals.  10 mL  1  . insulin glargine (LANTUS) 100 UNIT/ML injection Inject 0.17 mLs (17 Units total) into the skin at bedtime.  10 mL  1  . levothyroxine (SYNTHROID, LEVOTHROID) 25 MCG tablet Take 25 mcg by mouth daily.       . midodrine (PROAMATINE) 5 MG tablet Take 5 mg bid on non-dialysis days.  Take 10 mg bid on dialysis days.  90 tablet  0  . multivitamin (RENA-VIT) TABS tablet Take 1 tablet by mouth at bedtime.  30 tablet  0  . nitroGLYCERIN (NITROSTAT) 0.4 MG SL  tablet Place 0.4 mg under the tongue every 5 (five) minutes as needed. For chest pain.      Marland Kitchen. NOVOLIN R RELION 100 UNIT/ML injection Inject 6-10 Units into the skin 2 (two) times daily. If cbg 300 or 400  use 10 units, if cbg 200 use 6 unit, if cbg 160 use 2 units. Sliding scale      . pantoprazole (PROTONIX) 40 MG tablet Take 40 mg by mouth daily.       . Probiotic Product (PROBIOTIC PO) Take 1 capsule by mouth daily as needed (digestion).       . simvastatin (ZOCOR) 80 MG tablet Take 1 tablet (80 mg total) by mouth every evening.  30 tablet  0  . tiotropium (SPIRIVA) 18 MCG inhalation capsule Place 1 capsule (18 mcg total) into inhaler and inhale daily.  30 capsule  12   Assessment: [age] YOF with PMH of ESRD on MWF HD presented 4/24 from dialysis after an episode of possible cardiac arrest. Her last dialysis was 2 days ago. Pharmacy has been consulted to manage ceftazidime and vancomycin therapy.     WBC trending down to 10.1 today. LA 2.1. PCT < 0.1. She remains afebrile.   Cultures: 4/24 Blood Cx x2>> coag neg staph in 1/2 4/24 Urine Cx >>  3000 colonies  Patient is to receive HD tomorrow. Will enter doses in for MWF schedule.   Goal of Therapy:  Vancomycin Pre-HD level: 15-25 mcg/mL Resolution of infection  Plan:  1) Ceftazidime 2 gm IV on HD days  (MWF) 2) vancomycin 750mg  IV q HD (MWF) 3) F/u HD schedule - will need supplemental doses if extra sessions scheduled 4) Monitor CBC, cultures, and patient's clinical progress, vancomycin levels as indicated  5) F/u antibiotic plans, de-escalation, LOT  Kavin Weckwerth C. Ariana Cavenaugh, PharmD Clinical Pharmacist-Resident Pager: [phone] Pharmacy: [phone] 02/26/2014 1:00 PM

## 2014-02-26 NOTE — Progress Notes (Signed)
eLink Physician-Brief Progress Note Patient Name: [name] [name] DOB: 03/01/1941 MRN: [id number]  Date of Service  02/26/2014   HPI/Events of Note  Hypophosphatemia   eICU Interventions  Phos replaced   Intervention Category Intermediate Interventions: Electrolyte abnormality - evaluation and management  Dorise Hiss Deterding 02/26/2014, 5:16 AM

## 2014-02-26 NOTE — Progress Notes (Signed)
  [facility] Progress Note   Subjective: Breathing better , f/u CXR yest before HD showed no improvement bilat pulm edema and effusions  Filed Vitals:   02/26/14 0800 02/26/14 0832 02/26/14 0900 02/26/14 1204  BP: 174/71  182/70 145/52  Pulse: 92  103 100  Temp:    97.7 F (36.5 C)  TempSrc:    Oral  Resp: 18  25 22   Height:      Weight:      SpO2: 95% 90% 94% 94%  Exam: Alert, facial contusions No jvd Chest w bibasilar scattered rales RRR no MRG, tachy Abd soft, obese, NTND No LE or UE edema  Dialysis: MWF [place]  4h  64kg  F160   2/2.5 [place]   Heparin none   Prof 4   L IJ catheter  Hectorol 2    EPO none  Labs: ferr 1559, tsat 47%, phos 3.5, pth 257   Assessment:  1 Acute on chronic resp failure / pulm edema- still wet on CXR yesterday, breathing better. Cramping with large volume goals on HD.  2 ESRD on HD  3 Chronic resp failure on home O2  4 Hx CAD/MI  5 DM2  6 Anemia not on epo 7 MBD cont binders  8 Hypertension/volume- BP's up, scheduled midodrine on hold. At dry wt but still wet 9 Hypophosphatemia- replacing per CCM  Plan- HD tomorrow, max goal 2.5kg, use midodrine pre HD only   [name] MD  pager [phone]    cell [phone]  02/26/2014, 12:36 PM     Recent Labs Lab 02/24/14 1429 02/25/14 0329 02/26/14 0335  NA 142 138 135*  K 4.1 4.3 3.7  CL 98 98 95*  CO2 27 26 25   GLUCOSE 104* 161* 177*  BUN 6 15 15   CREATININE 0.95 2.04* 2.05*  CALCIUM 8.6 8.6 9.0  PHOS 1.0* 2.9 1.5*    Recent Labs Lab 02/24/14 0740  AST 44*  ALT 25  ALKPHOS 61  BILITOT <0.2*  PROT 3.9*  ALBUMIN 2.0*    Recent Labs Lab 02/24/14 0740 02/25/14 0329 02/26/14 0335  WBC 11.4* 11.3* 10.1  NEUTROABS 9.6* 9.7* 7.9*  HGB 10.3* 11.8* 11.6*  HCT 33.4* 37.8 36.6  MCV 97.9 94.5 93.6  PLT 149* 166 168   . aspirin EC  81 mg Oral Daily  . calcium acetate  667 mg Oral TID WC  . collagenase   Topical Daily  . heparin subcutaneous  5,000 Units  Subcutaneous 3 times per day  . insulin glargine  10 Units Subcutaneous QHS  . ipratropium-albuterol  3 mL Nebulization Q6H  . levothyroxine  25 mcg Oral Daily  . pantoprazole  40 mg Oral Daily  . simvastatin  80 mg Oral QPM   . sodium chloride 10 mL/hr at 02/25/14 0800  . sodium chloride Stopped (02/24/14 2000)   sodium chloride, sodium chloride, sodium chloride, feeding supplement (NEPRO CARB STEADY), heparin, lidocaine (PF), lidocaine-prilocaine, pentafluoroprop-tetrafluoroeth

## 2014-02-27 LAB — GLUCOSE, CAPILLARY
GLUCOSE-CAPILLARY: 232 mg/dL — AB (ref 70–99)
Glucose-Capillary: 101 mg/dL — ABNORMAL HIGH (ref 70–99)
Glucose-Capillary: 106 mg/dL — ABNORMAL HIGH (ref 70–99)
Glucose-Capillary: 112 mg/dL — ABNORMAL HIGH (ref 70–99)
Glucose-Capillary: 97 mg/dL (ref 70–99)

## 2014-02-27 LAB — MAGNESIUM: MAGNESIUM: 1.9 mg/dL (ref 1.5–2.5)

## 2014-02-27 LAB — CBC WITH DIFFERENTIAL/PLATELET
BASOS ABS: 0.1 10*3/uL (ref 0.0–0.1)
Basophils Relative: 1 % (ref 0–1)
EOS PCT: 4 % (ref 0–5)
Eosinophils Absolute: 0.3 10*3/uL (ref 0.0–0.7)
HCT: 33.7 % — ABNORMAL LOW (ref 36.0–46.0)
Hemoglobin: 10.6 g/dL — ABNORMAL LOW (ref 12.0–15.0)
Lymphocytes Relative: 8 % — ABNORMAL LOW (ref 12–46)
Lymphs Abs: 0.7 10*3/uL (ref 0.7–4.0)
MCH: 29 pg (ref 26.0–34.0)
MCHC: 31.5 g/dL (ref 30.0–36.0)
MCV: 92.3 fL (ref 78.0–100.0)
Monocytes Absolute: 0.8 10*3/uL (ref 0.1–1.0)
Monocytes Relative: 9 % (ref 3–12)
NEUTROS ABS: 7.1 10*3/uL (ref 1.7–7.7)
Neutrophils Relative %: 78 % — ABNORMAL HIGH (ref 43–77)
PLATELETS: 147 10*3/uL — AB (ref 150–400)
RBC: 3.65 MIL/uL — ABNORMAL LOW (ref 3.87–5.11)
RDW: 16.6 % — AB (ref 11.5–15.5)
WBC: 9.1 10*3/uL (ref 4.0–10.5)

## 2014-02-27 LAB — BASIC METABOLIC PANEL
BUN: 26 mg/dL — ABNORMAL HIGH (ref 6–23)
CALCIUM: 8.3 mg/dL — AB (ref 8.4–10.5)
CO2: 26 mEq/L (ref 19–32)
Chloride: 92 mEq/L — ABNORMAL LOW (ref 96–112)
Creatinine, Ser: 3.01 mg/dL — ABNORMAL HIGH (ref 0.50–1.10)
GFR calc non Af Amer: 15 mL/min — ABNORMAL LOW (ref >90)
GFR, EST AFRICAN AMERICAN: 17 mL/min — AB (ref >90)
Glucose, Bld: 105 mg/dL — ABNORMAL HIGH (ref 70–99)
Potassium: 3.7 mEq/L (ref 3.7–5.3)
SODIUM: 131 meq/L — AB (ref 137–147)

## 2014-02-27 LAB — LACTIC ACID, PLASMA
LACTIC ACID, VENOUS: 0.7 mmol/L (ref 0.5–2.2)
Lactic Acid, Venous: 1.1 mmol/L (ref 0.5–2.2)

## 2014-02-27 LAB — CORTISOL-AM, BLOOD: CORTISOL - AM: 14 ug/dL (ref 4.3–22.4)

## 2014-02-27 LAB — TSH: TSH: 2.2 u[IU]/mL (ref 0.350–4.500)

## 2014-02-27 MED ORDER — LIDOCAINE HCL (PF) 1 % IJ SOLN: 5.0000 mL | INTRAMUSCULAR | Status: DC | PRN

## 2014-02-27 MED ORDER — ALTEPLASE 2 MG IJ SOLR: 2.0000 mg | Freq: Once | INTRAMUSCULAR | Status: DC | PRN

## 2014-02-27 MED ORDER — CARVEDILOL 3.125 MG PO TABS
3.1250 mg | ORAL_TABLET | Freq: Two times a day (BID) | ORAL | Status: DC
  Administered 2014-02-27 – 2014-03-01 (×6): 3.125 mg via ORAL
  Filled 2014-02-27 (×7): qty 1

## 2014-02-27 MED ORDER — PENTAFLUOROPROP-TETRAFLUOROETH EX AERO
1.0000 | INHALATION_SPRAY | CUTANEOUS | Status: DC | PRN
Start: 2014-02-27 — End: 2014-02-27

## 2014-02-27 MED ORDER — INSULIN ASPART 100 UNIT/ML ~~LOC~~ SOLN
0.0000 [IU] | Freq: Three times a day (TID) | SUBCUTANEOUS | Status: DC
  Administered 2014-02-28 – 2014-03-01 (×3): 3 [IU] via SUBCUTANEOUS
  Administered 2014-03-01: 2 [IU] via SUBCUTANEOUS

## 2014-02-27 MED ORDER — RENA-VITE PO TABS
1.0000 | ORAL_TABLET | Freq: Every day | ORAL | Status: DC
  Administered 2014-02-27 – 2014-02-28 (×2): 1 via ORAL
  Filled 2014-02-27 (×4): qty 1

## 2014-02-27 MED ORDER — OXYCODONE-ACETAMINOPHEN 5-325 MG PO TABS
1.0000 | ORAL_TABLET | Freq: Once | ORAL | Status: AC
  Administered 2014-02-27: 1 via ORAL
  Filled 2014-02-27: qty 1

## 2014-02-27 MED ORDER — TRAMADOL HCL 50 MG PO TABS
50.0000 mg | ORAL_TABLET | Freq: Once | ORAL | Status: AC
  Administered 2014-02-27: 50 mg via ORAL
  Filled 2014-02-27: qty 1

## 2014-02-27 MED ORDER — PENTAFLUOROPROP-TETRAFLUOROETH EX AERO: 1.0000 "application " | INHALATION_SPRAY | CUTANEOUS | Status: DC | PRN

## 2014-02-27 MED ORDER — DARBEPOETIN ALFA-POLYSORBATE 60 MCG/0.3ML IJ SOLN
INTRAMUSCULAR | Status: AC
  Administered 2014-02-27: 60 ug via INTRAVENOUS
  Filled 2014-02-27: qty 0.3

## 2014-02-27 MED ORDER — SODIUM CHLORIDE 0.9 % IV SOLN: 100.0000 mL | INTRAVENOUS | Status: DC | PRN

## 2014-02-27 MED ORDER — NEPRO/CARBSTEADY PO LIQD: 237.0000 mL | ORAL | Status: DC | PRN

## 2014-02-27 MED ORDER — LIDOCAINE-PRILOCAINE 2.5-2.5 % EX CREA: 1.0000 "application " | TOPICAL_CREAM | CUTANEOUS | Status: DC | PRN

## 2014-02-27 MED ORDER — HEPARIN SODIUM (PORCINE) 1000 UNIT/ML DIALYSIS: 1000.0000 [IU] | INTRAMUSCULAR | Status: DC | PRN

## 2014-02-27 MED ORDER — DARBEPOETIN ALFA-POLYSORBATE 60 MCG/0.3ML IJ SOLN
60.0000 ug | INTRAMUSCULAR | Status: DC
  Administered 2014-02-27: 60 ug via INTRAVENOUS
  Filled 2014-02-27: qty 0.3

## 2014-02-27 MED ORDER — CYCLOBENZAPRINE HCL 5 MG PO TABS
5.0000 mg | ORAL_TABLET | Freq: Once | ORAL | Status: AC
  Administered 2014-02-27: 5 mg via ORAL
  Filled 2014-02-27: qty 1

## 2014-02-27 NOTE — Progress Notes (Signed)
Subjective: Interval History: has no complaint of breathing prob.  Objective: Vital signs in last 24 hours: Temp:  [97.4 F (36.3 C)-97.7 F (36.5 C)] 97.4 F (36.3 C) (04/27 0400) Pulse Rate:  [77-105] 82 (04/27 0500) Resp:  [14-26] 16 (04/27 0500) BP: (85-190)/(52-72) 161/65 mmHg (04/27 0500) SpO2:  [82 %-100 %] 100 % (04/27 0500) Weight:  [65.7 kg (144 lb 13.5 oz)] 65.7 kg (144 lb 13.5 oz) (04/27 0400) Weight change: 0.2 kg (7.1 oz)  Intake/Output from previous day: 04/26 0701 - 04/27 0700 In: 700 [P.O.:480; I.V.:220] Out: 225 [Urine:225] Intake/Output this shift:    General appearance: alert, cooperative, mildly obese and pale, bruises Resp: diminished breath sounds bilaterally and rales bibasilar Chest wall: LIJ cath Cardio: S1, S2 normal and systolic murmur: holosystolic 2/6, blowing at apex GI: pos bs,liver down 4 cm Extremities: extremities normal, atraumatic, no cyanosis or edema  Lab Results:  Recent Labs  02/26/14 0335 02/27/14 0407  WBC 10.1 9.1  HGB 11.6* 10.6*  HCT 36.6 33.7*  PLT 168 147*   BMET:  Recent Labs  02/26/14 0335 02/27/14 0407  NA 135* 131*  K 3.7 3.7  CL 95* 92*  CO2 25 26  GLUCOSE 177* 105*  BUN 15 26*  CREATININE 2.05* 3.01*  CALCIUM 9.0 8.3*   No results found for this basename: PTH,  in the last 72 hours Iron Studies: No results found for this basename: IRON, TIBC, TRANSFERRIN, FERRITIN,  in the last 72 hours  Studies/Results: No results found.  I have reviewed the patient's current medications.  Assessment/Plan: 1 ESRD for HD , try to get off 3 l.appears resp status much better 2 COPD appears to be primary issue 3 Arrest  ? pulm etio 4 PVD 5 Anemia stable use epo 6 CAD 7 CM controlled 8 Hypothyroid P HD, epo, DM control    LOS: 3 days   Olita Takeshita L Yenny Kosa 02/27/2014,7:11 AM

## 2014-02-27 NOTE — Progress Notes (Signed)
IV attemped to get peripheral line no success.  Will try to get someone else to look.

## 2014-02-27 NOTE — Progress Notes (Signed)
Pt transferred from Dialysis via 2H.  Pt A&OX4.  VSS.  Denies pain. Pt on contact for MSRA swab and ESVL in urine.

## 2014-02-27 NOTE — Progress Notes (Signed)
[facility] TEAM 1 - Stepdown/ICU TEAM Progress Note  Arna SnipeJudy A Niess ZHY:[id number] DOB: 11/26/1940 DOA: 02/24/2014 PCP: [name],Sadye Kiernan C, MD  Admit HPI / Brief Narrative: 73 year old F w/ Hx HTN, DM, PAD, CAD with NSTEMI in March 2015, CHF with EF 30-35%, ESRD/HD M/W/F, COPD on home O2 2L, hx DVT and PE not on anticoagulant due to GIB, and Hypothyroidism.  Patient stated that she had been fatigued for approximately 3 days, without clear precipitant at onset. She was noted to be unresponsive at her dialysis center and 2-10 mins of CPR was performed (per EMS/ED note). Upon arrival to the ED, she was initially hemodynamically stable. Soon thereafter she became unresponsive, hypotensive and bradycardic.   SIGNIFICANT EVENTS / STUDIES:  3/8 Hospitalized for NSTEMI, hypotension, UGIB and transaminitis  4/24 Unresponsive and received 2 minutes CPR at Dialysis center  4/24 Unresponsive, hypotensive and bradycardia at the ED and improved with BVM and IVF  4/24 CXR showed Pulmonary edema/infiltrates and RML/RLL opacities.  4/24 ABG showed acidemia with PH 7.219, PCO2 66.5, PO2 70, CO2 27  4/24 Bedside echo done and no pericardial effusion noted. Decreased LVEF 30-40%.  4/24 R femoral CVP line inserted. [facility]   HPI/Subjective: Pt has no complaints this morning.  States the catheter in her R groin is sore.  She denies n/v, sob, chest pain, or abdom pain.    Assessment/Plan:  Acute on chronic Respiratory Failure - hypoxemic and hypercapnic -resolving - etiology unclear, possibly secondary to decompensated CHF/volume overload  -patient received HD which did improve her pulmonary function -home O2 dependent at 2LPM at all times  -no clinical evidence to suggest infection - stop abx and follow   Pulmonary Edema / ESRD on HD M/W/F -improving w/ HD  -not yet at home O2 regimen, but improving - cont to wean O2 as able   Cardiac arrest? -suspected respiratory arrest due to  above - no evidence of cardiac event   Chronic Systolic CHF w/ acute exacerbation -patient not on CHF regimen at home, likely due to chronic hypotension issues - started Coreg 6.25 BID + Imdur 30 mg daily -holding on starting ACE/ARB secondary to patient's ESRD for now, and need to determine if BP will drop again post HD   HTN - uncontrolled  -midodrine on hold - follow post HD (due for tx today)  Sinus Bradycardia -resolved - follow w/ addition of Coreg   CAD / Recent NSTEMI  -on this admission does not appear to have had an additional NSTEMI -Troponin x3 negative despite reported CPR  -cont medical tx - added BB and nitrate this admit   Hx of UGIB and 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 -Hgb stable at this time   Hx PE/DVT -tolerating SQ heparin thus far   Anemia of chronic disease -per Nephrology -stable  Hypothyroidism -TSH not at goal of 1.0, but acute setting is not the best time to adjust dose - f/u in 4-6 weeks suggested   DM Type 2 -obtain hemoglobin A1c -continue Lantus 10 units daily -moderate SSI - CBG well controlled   1/2 Blood cx + coag neg staph Most c/w contamination - follow clinically w/o abx   Code Status: FULL Family Communication: no family present at time of exam Disposition Plan: transfer to renal tele bed   Consultants: Nephrology  PCCM > TRH   Procedure/Significant Events:  Carotid Doppler 12/28/2013  -Right = 1-39% ICA stenosis. - Left = No evidence of stenosis >  80% - evaluation with a second study recommended  02/24/2014 echocardiogram - Left ventricle: The cavity size was normal. Wall thickness was normal.  -LVEF= 30% to 35%. Severe hypokinesis of the entire myocardium. Akinesis of the apical myocardium. - Aortic valve: Trivial regurgitation. - Right ventricle: cavity size was mildly dilated.Systolic function moderately reduced. - Pericardium, extracardiac: A trivial pericardial effusion  Bilateral lower extremity Doppler 02/25/2012 No  evidence DVT   Antibiotics: 4/24 Vancomycin > 4/27 4/24 Ceftaz > 4/27  DVT prophylaxis: SQ heparin   Objective: VITAL SIGNS: Temp: 97.3 F (36.3 C) (04/27 0800) Temp src: Oral (04/27 0800) BP: 114/55 mmHg (04/27 1000) Pulse Rate: 91 (04/27 1000) SPO2;89% on 6 L via nasal cannula FIO2:   Intake/Output Summary (Last 24 hours) at 02/27/14 1056 Last data filed at 02/27/14 1000  Gross per 24 hour  Intake    720 ml  Output    225 ml  Net    495 ml   Exam: General: no acute resp distress - alert and conversant  Lungs: mild diffuse crackles - no wheeze  Cardiovascular: RRR w/o M, G, or Rub appreciable  Abdomen: Nontender, nondistended, soft, bowel sounds positive, no rebound, no ascites, no appreciable mass Extremities: No significant cyanosis, clubbing, or edema bilateral lower extremities  Data Reviewed: Basic Metabolic Panel:  Recent Labs Lab 02/24/14 0740 02/24/14 0900 02/24/14 1429 02/25/14 0329 02/26/14 0335 02/27/14 0407  NA 141  --  142 138 135* 131*  K 3.2*  --  4.1 4.3 3.7 3.7  CL 111  --  98 98 95* 92*  CO2 16*  --  27 26 25 26   GLUCOSE 186*  --  104* 161* 177* 105*  BUN 23  --  6 15 15  26*  CREATININE 1.96*  --  0.95 2.04* 2.05* 3.01*  CALCIUM 5.1*  --  8.6 8.6 9.0 8.3*  MG  --  1.8 1.8 1.8 1.8 1.9  PHOS  --  4.5 1.0* 2.9 1.5*  --    Liver Function Tests:  Recent Labs Lab 02/24/14 0740  AST 44*  ALT 25  ALKPHOS 61  BILITOT <0.2*  PROT 3.9*  ALBUMIN 2.0*    Recent Labs Lab 02/24/14 0900  LIPASE 23   CBC:  Recent Labs Lab 02/24/14 0740 02/25/14 0329 02/26/14 0335 02/27/14 0407  WBC 11.4* 11.3* 10.1 9.1  NEUTROABS 9.6* 9.7* 7.9* 7.1  HGB 10.3* 11.8* 11.6* 10.6*  HCT 33.4* 37.8 36.6 33.7*  MCV 97.9 94.5 93.6 92.3  PLT 149* 166 168 147*   Cardiac Enzymes:  Recent Labs Lab 02/24/14 0740 02/24/14 0900 02/24/14 1429 02/24/14 2025  TROPONINI <0.30 <0.30 <0.30 <0.30   BNP (last 3 results)  Recent Labs  10/26/13 0510  11/07/13 0455 02/24/14 0900  PROBNP 9552.0* 22434.0* 32267.0*   CBG:  Recent Labs Lab 02/25/14 2221 02/26/14 2108 02/27/14 0006 02/27/14 0357 02/27/14 0802  GLUCAP 198* 230* 112* 97 101*    Recent Results (from the past 240 hour(s))  CULTURE, BLOOD (ROUTINE X 2)     Status: None   Collection Time    02/24/14  9:00 AM      Result Value Ref Range Status   Specimen Description BLOOD PICC LINE   Final   Special Requests BOTTLES DRAWN AEROBIC AND ANAEROBIC   Final   Culture  Setup Time     Final   Value: 02/24/2014 13:47     Performed at [facility]  Final   Value: STAPHYLOCOCCUS SPECIES (COAGULASE NEGATIVE)     Note: Gram Stain Report Called to,Read Back By and Verified With: SYLVIA PILAND 02/25/14 @ 10:17AM BY RUSCOE A.     Performed at [facility]   Report Status PENDING   Incomplete  CULTURE, BLOOD (ROUTINE X 2)     Status: None   Collection Time    02/24/14  9:55 AM      Result Value Ref Range Status   Specimen Description BLOOD HAND LEFT   Final   Special Requests BOTTLES DRAWN AEROBIC ONLY   Final   Culture  Setup Time     Final   Value: 02/24/2014 14:12     Performed at [facility]   Culture     Final   Value:        BLOOD CULTURE RECEIVED NO GROWTH TO DATE CULTURE WILL BE HELD FOR 5 DAYS BEFORE ISSUING A FINAL NEGATIVE REPORT     Performed at [facility]   Report Status PENDING   Incomplete  MRSA PCR SCREENING     Status: None   Collection Time    02/24/14 10:21 AM      Result Value Ref Range Status   MRSA by PCR NEGATIVE  NEGATIVE Final   Comment:            The GeneXpert MRSA Assay (FDA     approved for NASAL specimens     only), is one component of a     comprehensive MRSA colonization     surveillance program. It is not     intended to diagnose MRSA     infection nor to guide or     monitor treatment for     MRSA infections.  URINE CULTURE     Status: None   Collection Time    02/24/14  4:28  PM      Result Value Ref Range Status   Specimen Description URINE, RANDOM   Final   Special Requests NONE   Final   Culture  Setup Time     Final   Value: 02/24/2014 20:32     Performed at [facility]   Colony Count     Final   Value: 3,000 COLONIES/ML     Performed at [facility]   Culture     Final   Value: INSIGNIFICANT GROWTH     Performed at [facility]   Report Status 02/26/2014 FINAL   Final     Studies:  Recent x-ray studies have been reviewed in detail by the Attending Physician  Scheduled Meds:  Scheduled Meds: . aspirin EC  81 mg Oral Daily  . calcium acetate  667 mg Oral TID WC  . carvedilol  3.125 mg Oral BID WC  . collagenase   Topical Daily  . darbepoetin (ARANESP) injection - DIALYSIS  60 mcg Intravenous Q Mon-HD  . heparin subcutaneous  5,000 Units Subcutaneous 3 times per day  . insulin aspart  0-15 Units Subcutaneous 6 times per day  . insulin glargine  10 Units Subcutaneous QHS  . ipratropium-albuterol  3 mL Nebulization Q6H  . isosorbide mononitrate  30 mg Oral Daily  . levothyroxine  25 mcg Oral Daily  . multivitamin  1 tablet Oral QHS  . pantoprazole  40 mg Oral Daily  . simvastatin  80 mg Oral QPM    Time spent on care of this patient: 35 mins   Tinnie Gens  Silvestre Gunner , MD   [facility] Office  [phone]  On-Call/Text Page:      [email or web address]      password [email or web address]  If 7PM-7AM, please contact night-coverage [email or web address] Password [facility] 02/27/2014, 10:56 AM   LOS: 3 days

## 2014-02-27 NOTE — Procedures (Signed)
I was present at this session.  I have reviewed the session itself and made appropriate changes.  HD via L IJ cath.  Hopefully get vol off.  Llana Aliment Amariana Mirando 4/27/201512:18 PM

## 2014-02-28 ENCOUNTER — Inpatient Hospital Stay (HOSPITAL_COMMUNITY): Payer: Medicare Other

## 2014-02-28 DIAGNOSIS — Z86718 Personal history of other venous thrombosis and embolism: Secondary | ICD-10-CM

## 2014-02-28 DIAGNOSIS — E7800 Pure hypercholesterolemia, unspecified: Secondary | ICD-10-CM

## 2014-02-28 LAB — CBC
HCT: 35.6 % — ABNORMAL LOW (ref 36.0–46.0)
HEMOGLOBIN: 10.9 g/dL — AB (ref 12.0–15.0)
MCH: 29.6 pg (ref 26.0–34.0)
MCHC: 30.6 g/dL (ref 30.0–36.0)
MCV: 96.7 fL (ref 78.0–100.0)
PLATELETS: 162 10*3/uL (ref 150–400)
RBC: 3.68 MIL/uL — ABNORMAL LOW (ref 3.87–5.11)
RDW: 16.9 % — ABNORMAL HIGH (ref 11.5–15.5)
WBC: 9.8 10*3/uL (ref 4.0–10.5)

## 2014-02-28 LAB — GLUCOSE, CAPILLARY
Glucose-Capillary: 86 mg/dL (ref 70–99)
Glucose-Capillary: 173 mg/dL — ABNORMAL HIGH (ref 70–99)

## 2014-02-28 LAB — BASIC METABOLIC PANEL
BUN: 14 mg/dL (ref 6–23)
CALCIUM: 8.4 mg/dL (ref 8.4–10.5)
CO2: 28 meq/L (ref 19–32)
Chloride: 97 mEq/L (ref 96–112)
Creatinine, Ser: 2.42 mg/dL — ABNORMAL HIGH (ref 0.50–1.10)
GFR calc Af Amer: 22 mL/min — ABNORMAL LOW (ref >90)
GFR calc non Af Amer: 19 mL/min — ABNORMAL LOW (ref >90)
Glucose, Bld: 89 mg/dL (ref 70–99)
POTASSIUM: 4.5 meq/L (ref 3.7–5.3)
SODIUM: 135 meq/L — AB (ref 137–147)

## 2014-02-28 LAB — LIPID PANEL
Cholesterol: 118 mg/dL (ref 0–200)
HDL: 59 mg/dL (ref >39)
LDL Cholesterol: 37 mg/dL (ref 0–99)
TRIGLYCERIDES: 112 mg/dL (ref <150)
Total CHOL/HDL Ratio: 2 RATIO
VLDL: 22 mg/dL (ref 0–40)

## 2014-02-28 LAB — HEMOGLOBIN A1C
HEMOGLOBIN A1C: 7.5 % — AB (ref <5.7)
MEAN PLASMA GLUCOSE: 169 mg/dL — AB (ref <117)

## 2014-02-28 MED ORDER — ISOSORBIDE MONONITRATE 15 MG HALF TABLET
15.0000 mg | ORAL_TABLET | Freq: Every day | ORAL | Status: DC
  Administered 2014-02-28 – 2014-03-01 (×2): 15 mg via ORAL
  Filled 2014-02-28 (×2): qty 1

## 2014-02-28 MED ORDER — CYCLOBENZAPRINE HCL 10 MG PO TABS
10.0000 mg | ORAL_TABLET | Freq: Three times a day (TID) | ORAL | Status: DC | PRN
  Administered 2014-02-28 – 2014-03-01 (×2): 10 mg via ORAL
  Filled 2014-02-28 (×2): qty 1

## 2014-02-28 MED ORDER — OXYCODONE-ACETAMINOPHEN 5-325 MG PO TABS
1.0000 | ORAL_TABLET | ORAL | Status: DC | PRN
  Administered 2014-03-01: 1 via ORAL

## 2014-02-28 NOTE — Progress Notes (Signed)
Patient complain of CP constant since CPR was performed.  Rates 5 out of 10.  MD notified pt requesting pain medication.

## 2014-02-28 NOTE — Progress Notes (Signed)
Physical Therapy Treatment Patient Details Name: [name] [name] MRN: [id number] DOB: 1941-04-05 Today's Date: 02/28/2014    History of Present Illness Patient is a 73 year old woman with a PMH of HTN, DM, PAD, CAD/MI with recent NSTEMI in March 2015, CHF with EF 30-35%, ESRD/HD MWF, COPD on home O2 2L, hx DVT and PE not on anticoagulant due to GIB, and Hypothyroidism. Patient states that she has been fatigued for approximately 3 days, without clear precipitant at onset. She was noted to be unresponsive at Dialysis center and ? 2-10 mins CPR was performed (per EMS/ED note). Upon arrival to the ED, she was initially hemodynamically stable. Soon thereafter she became unresponsive, hypotensive and bradycardia. Her mental status quickly improved but restless after BVM (bag valave mask) and IVF. As of 4/25, BP improved, likely to have been respiratory driven syncope due to fluid overload. Improved post dialysis. Given negative troponins seriously doubt patient was truly in cardiac arrest. Will order carotid dopplers and consult cards given history. Move to SDU and give to [name].    PT Comments    Making progress with mobility and activity tolerance, though still desatting with activity (as low as 85% with amb short distance on 5 L O2);   While pt is at a functional level which justifies SNF for rehab to maximize independence and safety with mobility, she is making improvements, and dc home is not entirely out of the question -- would recommend 24 hour assist/supervision (how much assist can her family give?), and HHPT/OT follow up  Follow Up Recommendations  SNF (Although can consider home if pt has 24 hour assist, and HHPT/OT follow-up)     Equipment Recommendations  Rolling walker with 5" wheels;3in1 (PT)    Recommendations for Other Services OT consult     Precautions / Restrictions Precautions Precautions: Fall;Other (comment) Precaution Comments: watch O2 sats closely    Mobility  Bed  Mobility Overal bed mobility: Needs Assistance Bed Mobility: Supine to Sit     Supine to sit: Min guard     General bed mobility comments: Did not need physical assist, but continues with valsalve-like breath-holding with the act of getting up, despite cues otherwise  Transfers Overall transfer level: Needs assistance Equipment used: Rolling walker (2 wheeled) Transfers: Sit to/from Stand Sit to Stand: Min guard         General transfer comment: Cues for hand placement and technique; Enough strength/muscle power to power-up  Ambulation/Gait Ambulation/Gait assistance: Min guard Ambulation Distance (Feet): 12 Feet Assistive device: Rolling walker (2 wheeled) Gait Pattern/deviations: Step-through pattern Gait velocity: decr   General Gait Details: Cues to keep RW close and self-monitor for activity tolerance   Stairs            Wheelchair Mobility    Modified Rankin (Stroke Patients Only)       Balance                                    Cognition Arousal/Alertness: Awake/alert Behavior During Therapy: WFL for tasks assessed/performed Overall Cognitive Status: Within Functional Limits for tasks assessed                      Exercises      General Comments        Pertinent Vitals/Pain Desatted to 85% with activity on 5 L O2    Home Living  Prior Function            PT Goals (current goals can now be found in the care plan section) Acute Rehab PT Goals Patient Stated Goal: Did not state PT Goal Formulation: With patient Time For Goal Achievement: 03/12/14 Potential to Achieve Goals: Good Progress towards PT goals: Progressing toward goals    Frequency  Min 3X/week    PT Plan Current plan remains appropriate    Co-evaluation             End of Session Equipment Utilized During Treatment: Oxygen Activity Tolerance: Other (comment) (limited by decr O2 sats) Patient left: in  chair;with call bell/phone within reach     Time: 1223-1236 PT Time Calculation (min): 13 min  Charges:  $Gait Training: 8-22 mins                    G Codes:      [name] [name] 02/28/2014, 1:46 PM Van ClinesHolly Lizandra Zakrzewski, PT  Acute Rehabilitation Services Pager [phone] Office [phone]

## 2014-02-28 NOTE — Progress Notes (Signed)
[facility] TEAM 1 - Stepdown/ICU TEAM Progress Note  [name] [name] QVZ:[id number] DOB: 02/12/1941 DOA: 02/24/2014 PCP: [name],[name], MD  Admit HPI / Brief Narrative: 73 year old WF PMHx  HTN, DM, PAD, CAD/MI with recent NSTEMI in March 2015, CHF with EF 30-35%, ESRD/HD MWF, COPD on home O2 2L, hx DVT and PE not on anticoagulant due to GIB, and Hypothyroidism.  Patient states that she has been fatigued for approximately 3 days, without clear precipitant at onset. She was noted to be unresponsive at Dialysis center and ? 2-10 mins CPR was performed (per EMS/ED note). Upon arrival to the ED, she was initially hemodynamically stable. Soon thereafter she became unresponsive, hypotensive and bradycardia. Her mental status quickly improved but restless after BVM and IVF. PCCM assumed care.    HPI/Subjective: 4/28 chronic SOB which is close to baseline, negative CP. States has had 2 previous AV fistula in bilateral upper extremities which both clotted off and required reversal.  Assessment/Plan:  Acute on chronic Respiratory Failure -resolving etiology unclear, Possibly secondary to decompensated CHF. Patient's BNP is 32,267, Compared to BNP 11/07/2013 which was 22,434 not significantly increased, but may be enough to push this patient over the edge who has multiple other medical problems. -patient received HD which did improve her pulmonary function. -4/28 patient close to baseline. Possible discharge following HD on 4/29   Pulmonary Edema/ ESRD on HD M/W/F -continues to require 6 L of O2 which is 3 times her home regimen. -patient should receive HD in the a.m. possible discharge  Systolic CHF -patient not on CHF regimen start Coreg 6.25 BID -Decrease Imdur 15 mg daily secondary to situational hypotension -would hold on starting ACE/ARB secondary to patient's ESRD   HTN -see systolic CHF  Sinus Bradycardia -resolved  Recent NSTEMI  -on this admission does not appear to have had an  additional NSTEMI. 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 Relative adrenal insufficiency -A.m. Cortisol level within normal limits  DM Type 2 -obtain hemoglobin A1c -continue Lantus 10 units daily -Continue moderate SSI       Code Status: FULL Family Communication: no family present at time of exam Disposition Plan: resolution of fluid overload    Consultants: -Dr. Delano Metzobert Schertz (nephrology)    Procedure/Significant Events: Carotid Doppler 12/28/2013  -Right = 1-39% ICA stenosis. - Left = 80-99% ICA stenosis based on systolic velocity. 1-39 % based diastolic velocity. 60-79%   02/24/2014 echocardiogram - Left ventricle: The cavity size was normal. Wall thickness was normal.  -LVEF= 30% to 35%. Severe hypokinesis of the entire myocardium. Akinesis of the apical myocardium. - Aortic valve: Trivial regurgitation. - Right ventricle: cavity size was mildly dilated.Systolic function moderately reduced. - Pericardium, extracardiac: A trivial pericardial effusion  PCXR 02/25/2014  -Developing pulmonary edema and enlarging bilateral layering effusions.  Bilateral lower extremity Doppler 02/25/2012 No evidence DVT   4/24 Rt femoral CVP inserted.  4/24 Bedside echo done and no pericardium effusion noted. Decreased LVEF 30-40%. 4/24 ABG showed acidemia with PH 7.219, PCO2 66.5, PO2 70, CO2 27 4/24 CXR showed Pulmonary edema/infiltrates and RML/RLL opacities 4/24 Unresponsive, hypotensive and bradycardia at the ED and improved with BVM and IVF 4/24 Unresponsive and received 2 minutes CPR at Dialysis  center 3/8 Hospitalized for NSTEMI, hypotension, UGIB and transaminitis      .         Culture 4/24 Blood culture  left hand/>>> NTD (pending) 4/24 blood culture PICC line>> coag negative staph 4/24 Urine>>> NTD (final) 4/24 Respiratory>>>  4/24 lactic acid>>>1.88  4/24 PCT>>> 0.10  Antibiotics: 4/24 Vancomycin>>> stopped 4/27 4/24 Ceftaz>>>stopped 4/27   DVT prophylaxis: Heparin TID    Devices   LINES / TUBES:  4/24 Rt femoral CVP>>>  ? L IJ HD catheter>>>    Continuous Infusions:    Objective: VITAL SIGNS: Temp: 98.7 F (37.1 C) (04/28 0735) Temp src: Oral (04/28 0735) BP: 133/63 mmHg (04/28 0735) Pulse Rate: 80 (04/28 0735) SPO2;95% on 5 L via nasal cannula FIO2:   Intake/Output Summary (Last 24 hours) at 02/28/14 0924 Last data filed at 02/27/14 1815  Gross per 24 hour  Intake    250 ml  Output   2536 ml  Net  -2286 ml     Exam: General: A./O. X4, NAD No acute respiratory distress Lungs: diffuse Rales, without wheezes or crackles Cardiovascular: Regular rhythm and rate, without murmur gallop or rub normal S1 and S2 Renalbalance today;        /overall;        Creatinine ;  2. 42      Hourly output   Abdomen: Nontender, nondistended, soft, bowel sounds positive, no rebound, no ascites, no appreciable mass Extremities: No significant cyanosis, clubbing, or edema bilateral lower extremities, left upper [place] last cath in place, clean/covered negative sign of infection  Data Reviewed: Basic Metabolic Panel:  Recent Labs Lab 02/24/14 0900 02/24/14 1429 02/25/14 0329 02/26/14 0335 02/27/14 0407 02/28/14 0615  NA  --  142 138 135* 131* 135*  K  --  4.1 4.3 3.7 3.7 4.5  CL  --  98 98 95* 92* 97  CO2  --  27 26 25 26 28   GLUCOSE  --  104* 161* 177* 105* 89  BUN  --  6 15 15  26* 14  CREATININE  --  0.95 2.04* 2.05* 3.01* 2.42*  CALCIUM  --  8.6 8.6 9.0 8.3* 8.4  MG 1.8 1.8 1.8 1.8 1.9  --   PHOS 4.5 1.0* 2.9 1.5*  --   --    Liver Function Tests:  Recent Labs Lab 02/24/14 0740  AST 44*  ALT 25  ALKPHOS 61  BILITOT <0.2*  PROT 3.9*  ALBUMIN 2.0*     Recent Labs Lab 02/24/14 0900  LIPASE 23   No results found for this basename: AMMONIA,  in the last 168 hours CBC:  Recent Labs Lab 02/24/14 0740 02/25/14 0329 02/26/14 0335 02/27/14 0407 02/28/14 0615  WBC 11.4* 11.3* 10.1 9.1 9.8  NEUTROABS 9.6* 9.7* 7.9* 7.1  --   HGB 10.3* 11.8* 11.6* 10.6* 10.9*  HCT 33.4* 37.8 36.6 33.7* 35.6*  MCV 97.9 94.5 93.6 92.3 96.7  PLT 149* 166 168 147* 162   Cardiac Enzymes:  Recent Labs Lab 02/24/14 0740 02/24/14 0900 02/24/14 1429 02/24/14 2025  TROPONINI <0.30 <0.30 <0.30 <0.30   BNP (last 3 results)  Recent Labs  10/26/13 0510 11/07/13 0455 02/24/14 0900  PROBNP 9552.0* 22434.0* 32267.0*   CBG:  Recent Labs Lab 02/27/14 0357 02/27/14 0802 02/27/14 1630 02/27/14 2047 02/28/14 0749  GLUCAP 97 101* 106* 232* 86    Recent Results (from the past 240 hour(s))  CULTURE, BLOOD (ROUTINE X 2)     Status: None  Collection Time    02/24/14  9:00 AM      Result Value Ref Range Status   Specimen Description BLOOD PICC LINE   Final   Special Requests BOTTLES DRAWN AEROBIC AND ANAEROBIC   Final   Culture  Setup Time     Final   Value: 02/24/2014 13:47     Performed at [facility]   Culture     Final   Value: STAPHYLOCOCCUS SPECIES (COAGULASE NEGATIVE)     Note: Gram Stain Report Called to,Read Back By and Verified With: SYLVIA PILAND 02/25/14 @ 10:17AM BY RUSCOE A.     Performed at [facility]   Report Status PENDING   Incomplete  CULTURE, BLOOD (ROUTINE X 2)     Status: None   Collection Time    02/24/14  9:55 AM      Result Value Ref Range Status   Specimen Description BLOOD HAND LEFT   Final   Special Requests BOTTLES DRAWN AEROBIC ONLY   Final   Culture  Setup Time     Final   Value: 02/24/2014 14:12     Performed at [facility]   Culture     Final   Value:        BLOOD CULTURE RECEIVED NO GROWTH TO DATE CULTURE WILL BE HELD FOR 5 DAYS BEFORE ISSUING A FINAL NEGATIVE  REPORT     Performed at [facility]   Report Status PENDING   Incomplete  MRSA PCR SCREENING     Status: None   Collection Time    02/24/14 10:21 AM      Result Value Ref Range Status   MRSA by PCR NEGATIVE  NEGATIVE Final   Comment:            The GeneXpert MRSA Assay (FDA     approved for NASAL specimens     only), is one component of a     comprehensive MRSA colonization     surveillance program. It is not     intended to diagnose MRSA     infection nor to guide or     monitor treatment for     MRSA infections.  URINE CULTURE     Status: None   Collection Time    02/24/14  4:28 PM      Result Value Ref Range Status   Specimen Description URINE, RANDOM   Final   Special Requests NONE   Final   Culture  Setup Time     Final   Value: 02/24/2014 20:32     Performed at [facility]   Colony Count     Final   Value: 3,000 COLONIES/ML     Performed at [facility]   Culture     Final   Value: INSIGNIFICANT GROWTH     Performed at [facility]   Report Status 02/26/2014 FINAL   Final     Studies:  Recent x-ray studies have been reviewed in detail by the Attending Physician  Scheduled Meds:  Scheduled Meds: . aspirin EC  81 mg Oral Daily  . calcium acetate  667 mg Oral TID WC  . carvedilol  3.125 mg Oral BID WC  . collagenase   Topical Daily  . darbepoetin (ARANESP) injection - DIALYSIS  60 mcg Intravenous Q Mon-HD  . heparin subcutaneous  5,000 Units Subcutaneous 3 times per day  . insulin aspart  0-15 Units Subcutaneous TID WC  .  insulin glargine  10 Units Subcutaneous QHS  . ipratropium-albuterol  3 mL Nebulization Q6H  . isosorbide mononitrate  15 mg Oral Daily  . levothyroxine  25 mcg Oral Daily  . multivitamin  1 tablet Oral QHS  . pantoprazole  40 mg Oral Daily  . simvastatin  80 mg Oral QPM    Time spent on care of this patient: 40 mins   Drema Dallas , MD   [facility] Office  [phone] Pager [phone]  On-Call/Text Page:      [email or web address]      password [email or web address]  If 7PM-7AM, please contact night-coverage [email or web address] Password [email or web address] 02/28/2014, 9:24 AM   LOS: 4 days

## 2014-02-28 NOTE — Progress Notes (Signed)
Subjective: Interval History: has no complaint of breathing prob.  Objective: Vital signs in last 24 hours: Temp:  [97.4 F (36.3 C)-98.7 F (37.1 C)] 98.7 F (37.1 C) (04/28 0735) Pulse Rate:  [78-91] 80 (04/28 0735) Resp:  [14-24] 18 (04/28 0735) BP: (78-163)/(41-75) 133/63 mmHg (04/28 0735) SpO2:  [84 %-98 %] 95 % (04/28 0735) Weight:  [63.1 kg (139 lb 1.8 oz)-66.1 kg (145 lb 11.6 oz)] 64.5 kg (142 lb 3.2 oz) (04/27 2051) Weight change: 0.4 kg (14.1 oz)  Intake/Output from previous day: 04/27 0701 - 04/28 0700 In: 510 [P.O.:480; I.V.:30] Out: 2536  Intake/Output this shift:    General appearance: cooperative, moderately obese and pale Resp: diminished breath sounds bilaterally Chest wall: LIJ cath Cardio: S1, S2 normal and systolic murmur: holosystolic 2/6, blowing at apex, HS decreased GI: pos bs, liver down 4 cm Extremities: extremities normal, atraumatic, no cyanosis or edema  Lab Results:  Recent Labs  02/27/14 0407 02/28/14 0615  WBC 9.1 9.8  HGB 10.6* 10.9*  HCT 33.7* 35.6*  PLT 147* 162   BMET:  Recent Labs  02/27/14 0407 02/28/14 0615  NA 131* 135*  K 3.7 4.5  CL 92* 97  CO2 26 28  GLUCOSE 105* 89  BUN 26* 14  CREATININE 3.01* 2.42*  CALCIUM 8.3* 8.4   No results found for this basename: PTH,  in the last 72 hours Iron Studies: No results found for this basename: IRON, TIBC, TRANSFERRIN, FERRITIN,  in the last 72 hours  Studies/Results: No results found.  I have reviewed the patient's current medications.  Assessment/Plan: 1 ESRD vol better. Breathing improved 2 COPD  Primary issue 3 Nonadherence 4 dm  Controlled 5 cad 6 Anemia better P hd, mobilize   LOS: 4 days   Sheri Gatchel L Lynnet Hefley 02/28/2014,9:54 AM

## 2014-02-28 NOTE — Evaluation (Signed)
Occupational Therapy Evaluation Patient Details Name: [name] [name] MRN: [id number] DOB: [birth year] Today's Date: 02/28/2014    History of Present Illness Patient is a 73 year old woman with a PMH of HTN, DM, PAD, CAD/MI with recent NSTEMI in March 2015, CHF with EF 30-35%, ESRD/HD MWF, COPD on home O2 2L, hx DVT and PE not on anticoagulant due to GIB, and Hypothyroidism. Patient states that she has been fatigued for approximately 3 days, without clear precipitant at onset. She was noted to be unresponsive at Dialysis center and ? 2-10 mins CPR was performed (per EMS/ED note). Upon arrival to the ED, she was initially hemodynamically stable. Soon thereafter she became unresponsive, hypotensive and bradycardia. Her mental status quickly improved but restless after BVM (bag valave mask) and IVF. As of 4/25, BP improved, likely to have been respiratory driven syncope due to fluid overload. Improved post dialysis. Given negative troponins seriously doubt patient was truly in cardiac arrest. Will order carotid dopplers and consult cards given history. Move to SDU and give to [facility].   Clinical Impression   Pt demonstrates decline in function and safety with ADls and ADL mobility safety with decreased strength, balance and endurance. Pt would benefit from acute OT services to address impairments to increase leevl of function and safety. Recommending SNF, but may be able to have HH OT instead of SNF if she will be able to have 24 hr sup at home    Follow Up Recommendations  SNF;Supervision/Assistance - 24 hour    Equipment Recommendations  Other (comment) (ADL A/E)    Recommendations for Other Services       Precautions / Restrictions Precautions Precautions: Fall;Other (comment) (O2 SATS) Precaution Comments: watch O2 sats closely Restrictions Weight Bearing Restrictions: No      Mobility Bed Mobility Overal bed mobility: Needs Assistance Bed Mobility: Sit to Supine     Supine to sit: Min  guard Sit to supine: Min guard   General bed mobility comments: pt up in recliner at start of session, assisted pt to bed at end of sessioon  Transfers Overall transfer level: Needs assistance Equipment used: Rolling walker (2 wheeled) Transfers: Sit to/from Stand Sit to Stand: Min assist         General transfer comment: Cues for hand placement and technique    Balance Overall balance assessment: No apparent balance deficits (not formally assessed)                                          ADL Overall ADL's : Needs assistance/impaired     Grooming: Wash/dry hands;Wash/dry face;Min guard   Upper Body Bathing: Supervision/ safety;Set up;Sitting   Lower Body Bathing: Min guard;Sit to/from stand   Upper Body Dressing : Set up;Supervision/safety;Sitting   Lower Body Dressing: Min guard;Sit to/from stand   Toilet Transfer: Minimal Sports coach Details (indicate cue type and reason): min A to initiate sit - stand Toileting- Architect and Hygiene: Min guard;Sit to/from stand   Tub/ Shower Transfer: Grab bars;3 in 1;Rolling walker;Minimal assistance   Functional mobility during ADLs: Min guard General ADL Comments: Pt's O2 SATs at 86% seated in recliner before activity. Pt instructed on slow deep breathing in from her nose an out through her mouth and O2 SATs went  up to 91%. O2 SATs dropped back to 84% during toileting and grooming at sink and pt again instructed  on deep breathing techniques to raise O2 SATs to safe levels     Vision  wears reading glasses                              Pertinent Vitals/Pain C/o 4/10 pain in L side.  Pt's O2 SATs at 86% seated in recliner before activity. Pt instructed on slow deep breathing in from her nose an out through her mouth and O2 SATs went  up to 91%. O2 SATs dropped back to 84% during toileting and grooming at sink and pt again instructed on deep breathing techniques to raise  O2 SATs to safe levels     Hand Dominance Right   Extremity/Trunk Assessment Upper Extremity Assessment Upper Extremity Assessment: Generalized weakness   Lower Extremity Assessment Lower Extremity Assessment: Defer to PT evaluation   Cervical / Trunk Assessment Cervical / Trunk Assessment: Normal   Communication Communication Communication: No difficulties   Cognition Arousal/Alertness: Awake/alert Behavior During Therapy: WFL for tasks assessed/performed Overall Cognitive Status: Within Functional Limits for tasks assessed                     General Comments   Pt pleasant and cooperative, fatigues easily                 Home Living Family/patient expects to be discharged to:: Private residence Living Arrangements: Children Available Help at Discharge: Family;Available PRN/intermittently Type of Home: House Home Access: Ramped entrance     Home Layout: One level     Bathroom Shower/Tub: Producer, television/film/videoWalk-in shower   Bathroom Toilet: Handicapped height     Home Equipment: Bedside commode;Shower seat;Cane - single point   Additional Comments: Has dialysis M,W,F      Prior Functioning/Environment Level of Independence: Independent        Comments: JRs transportation takes pt to dialysis MWF. Pt states that she cooks certain days of the week for her family, son helps her with laundry    OT Diagnosis: Generalized weakness   OT Problem List: Decreased strength;Decreased activity tolerance;Cardiopulmonary status limiting activity   OT Treatment/Interventions: Self-care/ADL training;Therapeutic exercise;Patient/family education;Neuromuscular education;Balance training;Therapeutic activities;Energy conservation;DME and/or AE instruction    OT Goals(Current goals can be found in the care plan section) Acute Rehab OT Goals Patient Stated Goal: Did not state ADL Goals Pt Will Perform Grooming: with set-up;with supervision;sitting;standing Pt Will Perform Lower  Body Bathing: with set-up;with supervision;sitting/lateral leans;sit to/from stand Pt Will Perform Lower Body Dressing: with set-up;with supervision;sitting/lateral leans;sit to/from stand Pt Will Transfer to Toilet: with supervision;ambulating;regular height toilet;bedside commode;grab bars;with min guard assist Pt Will Perform Toileting - Clothing Manipulation and hygiene: with supervision;sitting/lateral leans;sit to/from stand Pt Will Perform Tub/Shower Transfer: with min guard assist;with supervision;shower seat Additional ADL Goal #1: Pt will demonstrate proper deep breathing techniques to recover O2 SATs >90%  OT Frequency: Min 2X/week   Barriers to D/C: Decreased caregiver support  [facility] if she will be able to have 24 hr sup at home                     End of Session Equipment Utilized During Treatment: Rolling walker;Other (comment) (BSC)  Activity Tolerance: Patient limited by fatigue Patient left: in bed;with call bell/phone within reach   Time: 1347-1409 OT Time Calculation (min): 22 min Charges:  OT General Charges $OT Visit: 1 Procedure OT Evaluation $Initial OT Evaluation  Tier I: 1 Procedure OT Treatments $Therapeutic Activity: 8-22 mins G-Codes:    Lafe Garin 03-22-14, 2:26 PM

## 2014-03-01 ENCOUNTER — Inpatient Hospital Stay (HOSPITAL_COMMUNITY)
Admission: EM | Admit: 2014-03-01 | Discharge: 2014-03-04 | DRG: 312 | Disposition: A | Discharge status: Home / Self Care | Payer: Medicare Other | Attending: Internal Medicine | Admitting: Internal Medicine

## 2014-03-01 ENCOUNTER — Emergency Department (HOSPITAL_COMMUNITY): Payer: Medicare Other

## 2014-03-01 ENCOUNTER — Encounter (HOSPITAL_COMMUNITY): Payer: Self-pay | Admitting: Emergency Medicine

## 2014-03-01 DIAGNOSIS — I279 Pulmonary heart disease, unspecified: Secondary | ICD-10-CM | POA: Diagnosis present

## 2014-03-01 DIAGNOSIS — N186 End stage renal disease: Secondary | ICD-10-CM

## 2014-03-01 DIAGNOSIS — Z794 Long term (current) use of insulin: Secondary | ICD-10-CM

## 2014-03-01 DIAGNOSIS — E1129 Type 2 diabetes mellitus with other diabetic kidney complication: Secondary | ICD-10-CM | POA: Diagnosis present

## 2014-03-01 DIAGNOSIS — E119 Type 2 diabetes mellitus without complications: Secondary | ICD-10-CM | POA: Diagnosis present

## 2014-03-01 DIAGNOSIS — Z86711 Personal history of pulmonary embolism: Secondary | ICD-10-CM | POA: Diagnosis present

## 2014-03-01 DIAGNOSIS — Z992 Dependence on renal dialysis: Secondary | ICD-10-CM

## 2014-03-01 DIAGNOSIS — I2589 Other forms of chronic ischemic heart disease: Secondary | ICD-10-CM | POA: Diagnosis present

## 2014-03-01 DIAGNOSIS — R9431 Abnormal electrocardiogram [ECG] [EKG]: Secondary | ICD-10-CM

## 2014-03-01 DIAGNOSIS — I10 Essential (primary) hypertension: Secondary | ICD-10-CM

## 2014-03-01 DIAGNOSIS — K922 Gastrointestinal hemorrhage, unspecified: Secondary | ICD-10-CM | POA: Diagnosis present

## 2014-03-01 DIAGNOSIS — I255 Ischemic cardiomyopathy: Secondary | ICD-10-CM | POA: Diagnosis present

## 2014-03-01 DIAGNOSIS — Z833 Family history of diabetes mellitus: Secondary | ICD-10-CM

## 2014-03-01 DIAGNOSIS — I252 Old myocardial infarction: Secondary | ICD-10-CM

## 2014-03-01 DIAGNOSIS — I498 Other specified cardiac arrhythmias: Secondary | ICD-10-CM | POA: Diagnosis present

## 2014-03-01 DIAGNOSIS — I509 Heart failure, unspecified: Secondary | ICD-10-CM | POA: Diagnosis present

## 2014-03-01 DIAGNOSIS — I959 Hypotension, unspecified: Secondary | ICD-10-CM | POA: Diagnosis present

## 2014-03-01 DIAGNOSIS — Z86718 Personal history of other venous thrombosis and embolism: Secondary | ICD-10-CM

## 2014-03-01 DIAGNOSIS — I120 Hypertensive chronic kidney disease with stage 5 chronic kidney disease or end stage renal disease: Secondary | ICD-10-CM | POA: Diagnosis present

## 2014-03-01 DIAGNOSIS — I469 Cardiac arrest, cause unspecified: Secondary | ICD-10-CM | POA: Diagnosis present

## 2014-03-01 DIAGNOSIS — R001 Bradycardia, unspecified: Secondary | ICD-10-CM

## 2014-03-01 DIAGNOSIS — I5020 Unspecified systolic (congestive) heart failure: Secondary | ICD-10-CM | POA: Diagnosis present

## 2014-03-01 DIAGNOSIS — E039 Hypothyroidism, unspecified: Secondary | ICD-10-CM

## 2014-03-01 DIAGNOSIS — R531 Weakness: Secondary | ICD-10-CM

## 2014-03-01 DIAGNOSIS — I2510 Atherosclerotic heart disease of native coronary artery without angina pectoris: Secondary | ICD-10-CM | POA: Diagnosis present

## 2014-03-01 DIAGNOSIS — J4489 Other specified chronic obstructive pulmonary disease: Secondary | ICD-10-CM | POA: Diagnosis present

## 2014-03-01 DIAGNOSIS — Z87891 Personal history of nicotine dependence: Secondary | ICD-10-CM

## 2014-03-01 DIAGNOSIS — I5042 Chronic combined systolic (congestive) and diastolic (congestive) heart failure: Secondary | ICD-10-CM

## 2014-03-01 DIAGNOSIS — R55 Syncope and collapse: Principal | ICD-10-CM | POA: Diagnosis present

## 2014-03-01 DIAGNOSIS — I739 Peripheral vascular disease, unspecified: Secondary | ICD-10-CM | POA: Diagnosis present

## 2014-03-01 DIAGNOSIS — J449 Chronic obstructive pulmonary disease, unspecified: Secondary | ICD-10-CM | POA: Diagnosis present

## 2014-03-01 HISTORY — DX: Gastrointestinal hemorrhage, unspecified: K92.2

## 2014-03-01 LAB — CULTURE, BLOOD (ROUTINE X 2)

## 2014-03-01 LAB — GLUCOSE, CAPILLARY
GLUCOSE-CAPILLARY: 125 mg/dL — AB (ref 70–99)
GLUCOSE-CAPILLARY: 128 mg/dL — AB (ref 70–99)
Glucose-Capillary: 159 mg/dL — ABNORMAL HIGH (ref 70–99)
Glucose-Capillary: 192 mg/dL — ABNORMAL HIGH (ref 70–99)

## 2014-03-01 MED ORDER — CYCLOBENZAPRINE HCL 10 MG PO TABS: 10.0000 mg | ORAL_TABLET | Freq: Three times a day (TID) | ORAL | Status: AC | PRN

## 2014-03-01 MED ORDER — OXYCODONE-ACETAMINOPHEN 5-325 MG PO TABS
ORAL_TABLET | ORAL | Status: AC
  Filled 2014-03-01: qty 1

## 2014-03-01 MED ORDER — SODIUM CHLORIDE 0.9 % IV BOLUS (SEPSIS)
500.0000 mL | Freq: Once | INTRAVENOUS | Status: AC
  Administered 2014-03-01: 500 mL via INTRAVENOUS

## 2014-03-01 MED ORDER — ISOSORBIDE MONONITRATE 15 MG HALF TABLET: 15.0000 mg | ORAL_TABLET | Freq: Every day | ORAL | Status: DC

## 2014-03-01 MED ORDER — INSULIN GLARGINE 100 UNIT/ML ~~LOC~~ SOLN: 10.0000 [IU] | Freq: Every day | SUBCUTANEOUS | Status: DC

## 2014-03-01 MED ORDER — CARVEDILOL 3.125 MG PO TABS: 3.1250 mg | ORAL_TABLET | Freq: Two times a day (BID) | ORAL | Status: DC

## 2014-03-01 MED ORDER — ALBUTEROL SULFATE (5 MG/ML) 0.5% IN NEBU: 2.5000 mg | INHALATION_SOLUTION | Freq: Four times a day (QID) | RESPIRATORY_TRACT | Status: DC | PRN

## 2014-03-01 NOTE — Progress Notes (Signed)
Physical Therapy Treatment Patient Details Name: [name] [name] MRN: [id number] DOB: 12-02-1940 Today's Date: 03/01/2014    History of Present Illness Patient is a 73 year old woman with a PMH of HTN, DM, PAD, CAD/MI with recent NSTEMI in March 2015, CHF with EF 30-35%, ESRD/HD MWF, COPD on home O2 2L, hx DVT and PE not on anticoagulant due to GIB, and Hypothyroidism. Patient states that she has been fatigued for approximately 3 days, without clear precipitant at onset. She was noted to be unresponsive at Dialysis center and ? 2-10 mins CPR was performed (per EMS/ED note). Upon arrival to the ED, she was initially hemodynamically stable. Soon thereafter she became unresponsive, hypotensive and bradycardia. Her mental status quickly improved but restless after BVM (bag valave mask) and IVF. As of 4/25, BP improved, likely to have been respiratory driven syncope due to fluid overload. Improved post dialysis. Given negative troponins seriously doubt patient was truly in cardiac arrest. Will order carotid dopplers and consult cards given history. Move to SDU and give to [facility].    PT Comments    Low O2 sats with activity (even on 4-5 supplemental O2); Discussed dc planning, and pt continues to decline SNF; If pt is to dc home, she will clearly need continued supplemental O2  In addition, will recommend wheelchair as she does not tolerate incr activity (O2 sats decr to 85% with amb on 4-5 L O2), RW and 3in1    Follow Up Recommendations  Home health PT;Supervision/Assistance - 24 hour (Pt politely declines SNF) HHPT, HHOT, HHAide, HHRN follow-up for chronic disease Management     Equipment Recommendations  Rolling walker with 5" wheels;3in1 (PT);Wheelchair (measurements PT)    Recommendations for Other Services       Precautions / Restrictions Precautions Precautions: Fall;Other (comment) (O2 SATS) Precaution Comments: watch O2 sats closely    Mobility  Bed Mobility               General  bed mobility comments: EOB upon arrival  Transfers Overall transfer level: Needs assistance Equipment used: Rolling walker (2 wheeled) Transfers: Sit to/from Stand Sit to Stand: Min guard         General transfer comment: Cues for hand placement and technique  Ambulation/Gait Ambulation/Gait assistance: Supervision Ambulation Distance (Feet): 6 Feet Assistive device: Rolling walker (2 wheeled) Gait Pattern/deviations: Step-through pattern     General Gait Details: Cues to keep RW close and self-monitor for activity tolerance   Stairs Stairs:  (has ramp)          Wheelchair Mobility    Modified Rankin (Stroke Patients Only)       Balance Overall balance assessment: No apparent balance deficits (not formally assessed)                                  Cognition Arousal/Alertness: Awake/alert Behavior During Therapy: WFL for tasks assessed/performed Overall Cognitive Status: Within Functional Limits for tasks assessed                      Exercises      General Comments        Pertinent Vitals/Pain O2 sats ranged 85-92% with amb on 5 L O2 RN aware, and Respiratory Therapy present at end of session    Home Living  Prior Function            PT Goals (current goals can now be found in the care plan section) Acute Rehab PT Goals Patient Stated Goal: Would like to go home PT Goal Formulation: With patient Time For Goal Achievement: 03/12/14 Potential to Achieve Goals: Good Progress towards PT goals: Progressing toward goals    Frequency  Min 3X/week    PT Plan Current plan remains appropriate;Equipment recommendations need to be updated    Co-evaluation             End of Session Equipment Utilized During Treatment: Oxygen Activity Tolerance: Other (comment) (limited by decr O2 sats) Patient left: in chair;with call bell/phone within reach (Resp therapist present)     Time:  [id number] PT Time Calculation (min): 21 min  Charges:  $Gait Training: 8-22 mins                    G Codes:      [facility] Ham[facility] DealGarrigan 03/01/2014, 9:02 AM Van ClinesHolly Andriana Casa, PT  Acute Rehabilitation Services Pager [phone] Office [phone] ]

## 2014-03-01 NOTE — Procedures (Signed)
I was present at this session.  I have reviewed the session itself and made appropriate changes.  Vol xs, bp brittle. HD via PC, flow 400  Llana Aliment Bodi Palmeri 4/29/201512:24 PM

## 2014-03-01 NOTE — Progress Notes (Signed)
Pt gone to dialysis at this time.

## 2014-03-01 NOTE — Progress Notes (Signed)
Subjective: Interval History: has complaints wants to go home.  Objective: Vital signs in last 24 hours: Temp:  [98.2 F (36.8 C)-98.9 F (37.2 C)] 98.2 F (36.8 C) (04/29 0458) Pulse Rate:  [84-88] 85 (04/29 0458) Resp:  [18-19] 18 (04/29 0458) BP: (143-149)/(55-65) 149/64 mmHg (04/29 0458) SpO2:  [92 %-99 %] 92 % (04/29 0854) Weight:  [65.1 kg (143 lb 8.3 oz)] 65.1 kg (143 lb 8.3 oz) (04/28 2125) Weight change: -1 kg (-2 lb 3.3 oz)  Intake/Output from previous day: 04/28 0701 - 04/29 0700 In: 360 [P.O.:360] Out: -  Intake/Output this shift:    General appearance: alert, cooperative, mildly obese and bruises on face and neck Neck: LIJ cath Resp: diminished breath sounds bilaterally and rales in bases, little air movement Cardio: S1, S2 normal and systolic murmur: holosystolic 2/6, blowing at apex GI: pos bs, soft, liver down Extremities: trophic changes distally  Lab Results:  Recent Labs  02/27/14 0407 02/28/14 0615  WBC 9.1 9.8  HGB 10.6* 10.9*  HCT 33.7* 35.6*  PLT 147* 162   BMET:  Recent Labs  02/27/14 0407 02/28/14 0615  NA 131* 135*  K 3.7 4.5  CL 92* 97  CO2 26 28  GLUCOSE 105* 89  BUN 26* 14  CREATININE 3.01* 2.42*  CALCIUM 8.3* 8.4   No results found for this basename: PTH,  in the last 72 hours Iron Studies: No results found for this basename: IRON, TIBC, TRANSFERRIN, FERRITIN,  in the last 72 hours  Studies/Results: Dg Chest 2 View  02/28/2014   CLINICAL DATA:  Over clip chest pain, shortness of breath, history hypertension, CHF, diabetes, COPD, former smoker, end-stage renal disease on dialysis  EXAM: CHEST  2 VIEW  COMPARISON:  02/25/2014  FINDINGS: RIGHT jugular dual-lumen central venous catheter with distal tip projecting over the RIGHT atrium.  Enlargement of cardiac silhouette with pulmonary vascular congestion.  Atherosclerotic calcifications aorta.  Bibasilar effusions and atelectasis.  Peribronchial thickening with improved perihilar  edema since previous exam.  No pneumothorax.  Bones demineralized.  IMPRESSION: Enlargement of cardiac silhouette with pulmonary vascular congestion.  Improved pulmonary edema with persistent bibasilar effusions and atelectasis.   Electronically Signed   By: Ulyses Southward M.D.   On: 02/28/2014 15:29    I have reviewed the patient's current medications.  Assessment/Plan: 1 ESRD for HD, try to lower vol. Has effusions and cannot take rapidly. 2 Severe COPD ???spireva 3 CAD 4 PVD 5 Anemia check Hb at hd 6 HPTH 7 NONadherence aggravates severe med prob P HD, epo, ? spireva  Ok to D/C from my standpoint, will gradually lower wgt    LOS: 5 days   Candice Lunney L Khalib Fendley 03/01/2014,9:17 AM

## 2014-03-01 NOTE — ED Notes (Signed)
Pt reporting chest pain but sts she has been having chest pain since receiving chest compressions when she coded at the hospital.

## 2014-03-01 NOTE — ED Notes (Signed)
Pt with hx of COPD, sts she wears 4L at home.  Pt sating 90% on arrival on 4L; O2 increased to 5L, currently sating 93%.

## 2014-03-01 NOTE — ED Notes (Signed)
Pt transported to Xray. 

## 2014-03-01 NOTE — Progress Notes (Signed)
Pt discharge instructions and prescriptions given.  Pt verbalized understanding.  Pt waiting on son to pick her up and take her home.

## 2014-03-01 NOTE — ED Notes (Signed)
Per EMS: pt just discharged from [facility] today.  Pt noted to be unresponsive with son en route home.  Pt unresponsive with fire but responsive and CAO x 4 with EMS. Pt only reporting weakness denies chest pain, shortness of breath or any other complaints.

## 2014-03-01 NOTE — Discharge Summary (Signed)
Physician Discharge Summary  Arna SnipeJudy A Sircy ZOX:[id number] DOB: 06/07/1941 DOA: 02/24/2014  PCP: [name],[name], MD  Admit date: 02/24/2014 Discharge date: 03/01/2014  Time spent: >35 minutes  Recommendations for Outpatient Follow-up:  HHC F/u with HD  As scheduled F/u with PCP in 1-2 weeks  Discharge Diagnoses:  Principal Problem:   Cardiac arrest Active Problems:   Peripheral arterial disease   Hypercholesterolemia   Hypothyroidism   HTN (hypertension)   History of DVT (deep vein thrombosis)   History of pulmonary embolus (PE)   CAD (coronary artery disease)   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  Patient states that she has been fatigued for approximately 3 days, without clear precipitant at onset. She was noted to be unresponsive at Dialysis center and ? 2-10 mins CPR was performed (per EMS/ED note). Upon arrival to the ED, she was initially hemodynamically stable. Soon thereafter she became unresponsive, hypotensive and bradycardia. Her mental status quickly improved but restless after BVM and IVF. PCCM assumed care.   Hospital Course:  1. Acute on chronic Respiratory Failure likely due to pulmonary edema on tyop of severe COPD  -resolved; Possibly secondary to decompensated CHF as well .  -per nephrologist recommended to cont HD gradual increase for volume control; cleared for discharge  2.  Pulmonary Edema/ ESRD on HD M/W/F  -as above, cont bronchodilators, oxygen, HD for volume control  3. Systolic CHF  -patient not on CHF regimen start Coreg 6.25 BID  -Decrease Imdur 15 mg daily secondary to situational hypotension  -would hold on starting ACE/ARB secondary to patient's ESRD  4. HTN  -see systolic CHF  5. Sinus Bradycardia  -resolved  6. Recent NSTEMI  -on this admission does not appear to have had an additional NSTEMI.  -Troponin x4 negative  7. 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 Hx PE/DVT  -used SCDs only secondary to upper GI bleed in March  9. Anemia of chronic disease  -per nephrology  -stable  10. Hypothyroidism  11. DM Type 2, mild hypoglycemia; HA1C-7.5 -decrease Lantus 10 units daily     Patient is being d/c in stable condition post HD    Procedures:   (i.e. Studies not automatically included, echos, thoracentesis, etc; not x-rays)  Consultations:  Nephrology   Discharge Exam: Filed Vitals:   03/01/14 1145  BP: 144/74  Pulse: 94  Temp:   Resp: 18    General: alert Cardiovascular: s1,s2 rrr Respiratory:  Few crackles in LL  Discharge Instructions  Discharge Orders   Future Orders Complete By Expires   Diet - low sodium heart healthy  As directed    Discharge instructions  As directed    Increase activity slowly  As directed  Medication List    STOP taking these medications       NOVOLIN R RELION 100 units/mL injection  Generic drug:  insulin regular      TAKE these medications       albuterol (5 MG/ML) 0.5% nebulizer solution  Commonly known as:  PROVENTIL  Take 0.5 mLs (2.5 mg total) by nebulization every 6 (six) hours as needed for wheezing or shortness of breath.     aspirin EC 81 MG tablet  Take 81 mg by mouth daily.     calcium acetate 667 MG capsule  Commonly known as:  PHOSLO  Take 667 mg by mouth 3 (three) times daily with meals.      carvedilol 3.125 MG tablet  Commonly known as:  COREG  Take 1 tablet (3.125 mg total) by mouth 2 (two) times daily with a meal.     cyclobenzaprine 10 MG tablet  Commonly known as:  FLEXERIL  Take 1 tablet (10 mg total) by mouth 3 (three) times daily as needed for muscle spasms.     guaiFENesin 600 MG 12 hr tablet  Commonly known as:  MUCINEX  Take 1 tablet (600 mg total) by mouth 2 (two) times daily as needed for cough.     insulin aspart 100 UNIT/ML injection  Commonly known as:  novoLOG  Inject 2 Units into the skin 3 (three) times daily with meals.     insulin glargine 100 UNIT/ML injection  Commonly known as:  LANTUS  Inject 0.1 mLs (10 Units total) into the skin at bedtime.     isosorbide mononitrate 15 mg Tb24 24 hr tablet  Commonly known as:  IMDUR  Take 0.5 tablets (15 mg total) by mouth daily.     levothyroxine 25 MCG tablet  Commonly known as:  SYNTHROID, LEVOTHROID  Take 25 mcg by mouth daily.     midodrine 5 MG tablet  Commonly known as:  PROAMATINE  Take 10 mg by mouth 2 (two) times daily with a meal. On dialysis days. Monday,wednesday and friday     multivitamin Tabs tablet  Take 1 tablet by mouth at bedtime.     nitroGLYCERIN 0.4 MG SL tablet  Commonly known as:  NITROSTAT  Place 0.4 mg under the tongue every 5 (five) minutes as needed for chest pain. For chest pain.     pantoprazole 40 MG tablet  Commonly known as:  PROTONIX  Take 40 mg by mouth daily.     PROBIOTIC PO  Take 1 capsule by mouth daily as needed (digestion).     simvastatin 80 MG tablet  Commonly known as:  ZOCOR  Take 1 tablet (80 mg total) by mouth every evening.     tiotropium 18 MCG inhalation capsule  Commonly known as:  SPIRIVA  Place 1 capsule (18 mcg total) into inhaler and inhale daily.       Allergies  Allergen Reactions  . Procaine Hcl Nausea And Vomiting and Other (See Comments)    [facility] has reaction of "comatose"       Follow-up  Information   Follow up with HOOPER,[name], MD. Schedule an appointment as soon as possible for a visit in 1 week.   Specialty:  Geriatric Medicine   Contact information:   [address] [place] [address] [phone]        The results of significant diagnostics from this hospitalization (including imaging, microbiology, ancillary and laboratory) are listed below for reference.    Significant Diagnostic Studies:  Dg Chest 2 View  02/28/2014   CLINICAL DATA:  Over clip chest pain, shortness of breath, history hypertension, CHF, diabetes, COPD, former smoker, end-stage renal disease on dialysis  EXAM: CHEST  2 VIEW  COMPARISON:  02/25/2014  FINDINGS: RIGHT jugular dual-lumen central venous catheter with distal tip projecting over the RIGHT atrium.  Enlargement of cardiac silhouette with pulmonary vascular congestion.  Atherosclerotic calcifications aorta.  Bibasilar effusions and atelectasis.  Peribronchial thickening with improved perihilar edema since previous exam.  No pneumothorax.  Bones demineralized.  IMPRESSION: Enlargement of cardiac silhouette with pulmonary vascular congestion.  Improved pulmonary edema with persistent bibasilar effusions and atelectasis.   Electronically Signed   By: Ulyses Southward M.D.   On: 02/28/2014 15:29   Dg Chest Port 1 View  02/25/2014   CLINICAL DATA:  Respiratory failure  EXAM: PORTABLE CHEST - 1 VIEW  COMPARISON:  Prior chest x-ray 02/24/2014  FINDINGS: Stable cardiomegaly. Left IJ approach split tip hemodialysis catheter with catheter tips at the superior cavoatrial junction and upper right atrium. Atherosclerotic calcifications in the transverse aorta. Increased pulmonary edema. Enlarging bilateral layering pleural effusions and associated bibasilar atelectasis. No acute osseous abnormality. No pneumothorax.  IMPRESSION: Developing pulmonary edema and enlarging bilateral layering effusions. Differential considerations include CHF and volume overload.    Electronically Signed   By: Malachy Moan M.D.   On: 02/25/2014 09:16   Dg Chest Port 1 View  02/24/2014   CLINICAL DATA:  post-arrest  EXAM: PORTABLE CHEST - 1 VIEW  COMPARISON:  DG CHEST 1V PORT dated 01/04/2014  FINDINGS: The cardiac silhouette is enlarged. There is diffuse prominence of interstitial markings, peribronchial cuffing, indistinctness of pulmonary vasculature. Areas of increased density in the lung bases. Small bilateral effusions left greater than right. No acute osseous abnormalities.  IMPRESSION: Pulmonary edema and small bilateral effusions. Areas of increased density within the lung bases consistent with areas of asymmetric edema versus atelectasis.   Electronically Signed   By: Salome Holmes M.D.   On: 02/24/2014 08:12   Dg Abd Portable 1v  02/24/2014   CLINICAL DATA:  confirm R fem line  EXAM: PORTABLE ABDOMEN - 1 VIEW  COMPARISON:  DG LUMBAR SPINE COMPLETE 4+V dated 01/16/2014; CT ABD/PELV WO CM dated 03/09/2013  FINDINGS: The bowel gas pattern is normal. Coarse vascular calcifications appreciated. Tip of a right femoral central venous catheter is appreciated projecting in the region of the proximal femoral vein on the right. No acute osseous abnormalities.  IMPRESSION: Nonobstructive bowel gas pattern. Right femoral vein central venous catheter appreciated.   Electronically Signed   By: Salome Holmes M.D.   On: 02/24/2014 08:11   Dg Foot Complete Left  02/24/2014   CLINICAL DATA:  Left heel wound.  EXAM: LEFT FOOT - COMPLETE 3+ VIEW  COMPARISON:  Os calcis 04/19/2013.  FINDINGS: Skin wound is seen along the lateral margin of the calcaneus. No bony destructive change is seen although the calcaneus is not well visualized on this exam. Pes cavus deformity is noted.  IMPRESSION: Soft tissue wound lateral calcaneus. No plain film evidence of osteomyelitis although the calcaneus is poorly visualized.  Pes cavus.   Electronically Signed   By: Drusilla Kanner M.D.   On: 02/24/2014 20:42     Microbiology: Recent Results (from the past 240 hour(s))  CULTURE, BLOOD (ROUTINE X 2)     Status: None   Collection Time    02/24/14  9:00 AM      Result Value Ref Range  Status   Specimen Description BLOOD PICC LINE   Final   Special Requests BOTTLES DRAWN AEROBIC AND ANAEROBIC   Final   Culture  Setup Time     Final   Value: 02/24/2014 13:47     Performed at [facility]   Culture     Final   Value: STAPHYLOCOCCUS SPECIES (COAGULASE NEGATIVE)     Note: Gram Stain Report Called to,Read Back By and Verified With: SYLVIA PILAND 02/25/14 @ 10:17AM BY RUSCOE A.     Performed at [facility]   Report Status 03/01/2014 FINAL   Final   Organism ID, Bacteria STAPHYLOCOCCUS SPECIES (COAGULASE NEGATIVE)   Final  CULTURE, BLOOD (ROUTINE X 2)     Status: None   Collection Time    02/24/14  9:55 AM      Result Value Ref Range Status   Specimen Description BLOOD HAND LEFT   Final   Special Requests BOTTLES DRAWN AEROBIC ONLY   Final   Culture  Setup Time     Final   Value: 02/24/2014 14:12     Performed at [facility]   Culture     Final   Value:        BLOOD CULTURE RECEIVED NO GROWTH TO DATE CULTURE WILL BE HELD FOR 5 DAYS BEFORE ISSUING A FINAL NEGATIVE REPORT     Performed at [facility]   Report Status PENDING   Incomplete  MRSA PCR SCREENING     Status: None   Collection Time    02/24/14 10:21 AM      Result Value Ref Range Status   MRSA by PCR NEGATIVE  NEGATIVE Final   Comment:            The GeneXpert MRSA Assay (FDA     approved for NASAL specimens     only), is one component of a     comprehensive MRSA colonization     surveillance program. It is not     intended to diagnose MRSA     infection nor to guide or     monitor treatment for     MRSA infections.  URINE CULTURE     Status: None   Collection Time    02/24/14  4:28 PM      Result Value Ref Range Status   Specimen Description URINE, RANDOM   Final   Special Requests  NONE   Final   Culture  Setup Time     Final   Value: 02/24/2014 20:32     Performed at [facility]   Colony Count     Final   Value: 3,000 COLONIES/ML     Performed at [facility]   Culture     Final   Value: INSIGNIFICANT GROWTH     Performed at [facility]   Report Status 02/26/2014 FINAL   Final     Labs: Basic Metabolic Panel:  Recent Labs Lab 02/24/14 0900 02/24/14 1429 02/25/14 0329 02/26/14 0335 02/27/14 0407 02/28/14 0615  NA  --  142 138 135* 131* 135*  K  --  4.1 4.3 3.7 3.7 4.5  CL  --  98 98 95* 92* 97  CO2  --  27 26 25 26 28   GLUCOSE  --  104* 161* 177* 105* 89  BUN  --  6 15 15  26* 14  CREATININE  --  0.95 2.04* 2.05* 3.01* 2.42*  CALCIUM  --  8.6 8.6 9.0 8.3* 8.4  MG 1.8 1.8 1.8 1.8 1.9  --   PHOS 4.5 1.0* 2.9 1.5*  --   --    Liver Function Tests:  Recent Labs Lab 02/24/14 0740  AST 44*  ALT 25  ALKPHOS 61  BILITOT <0.2*  PROT 3.9*  ALBUMIN 2.0*    Recent Labs Lab 02/24/14 0900  LIPASE 23   No results found for this basename: AMMONIA,  in the last 168 hours CBC:  Recent Labs Lab 02/24/14 0740 02/25/14 0329 02/26/14 0335 02/27/14 0407 02/28/14 0615  WBC 11.4* 11.3* 10.1 9.1 9.8  NEUTROABS 9.6* 9.7* 7.9* 7.1  --   HGB 10.3* 11.8* 11.6* 10.6* 10.9*  HCT 33.4* 37.8 36.6 33.7* 35.6*  MCV 97.9 94.5 93.6 92.3 96.7  PLT 149* 166 168 147* 162   Cardiac Enzymes:  Recent Labs Lab 02/24/14 0740 02/24/14 0900 02/24/14 1429 02/24/14 2025  TROPONINI <0.30 <0.30 <0.30 <0.30   BNP: BNP (last 3 results)  Recent Labs  10/26/13 0510 11/07/13 0455 02/24/14 0900  PROBNP 9552.0* 22434.0* 32267.0*   CBG:  Recent Labs Lab 02/28/14 0749 02/28/14 1213 02/28/14 1717 02/28/14 2121 03/01/14 0810  GLUCAP 86 173* 125* 159* 128*       Signed:  Esperanza Sheets  [facility] 03/01/2014, 12:24 PM

## 2014-03-02 ENCOUNTER — Encounter (HOSPITAL_COMMUNITY): Payer: Self-pay | Admitting: Internal Medicine

## 2014-03-02 DIAGNOSIS — I5020 Unspecified systolic (congestive) heart failure: Secondary | ICD-10-CM

## 2014-03-02 DIAGNOSIS — R55 Syncope and collapse: Principal | ICD-10-CM

## 2014-03-02 DIAGNOSIS — R5381 Other malaise: Secondary | ICD-10-CM

## 2014-03-02 DIAGNOSIS — Z992 Dependence on renal dialysis: Secondary | ICD-10-CM

## 2014-03-02 DIAGNOSIS — R531 Weakness: Secondary | ICD-10-CM | POA: Insufficient documentation

## 2014-03-02 DIAGNOSIS — R5383 Other fatigue: Secondary | ICD-10-CM

## 2014-03-02 DIAGNOSIS — N186 End stage renal disease: Secondary | ICD-10-CM

## 2014-03-02 DIAGNOSIS — I498 Other specified cardiac arrhythmias: Secondary | ICD-10-CM

## 2014-03-02 DIAGNOSIS — R001 Bradycardia, unspecified: Secondary | ICD-10-CM

## 2014-03-02 DIAGNOSIS — I2510 Atherosclerotic heart disease of native coronary artery without angina pectoris: Secondary | ICD-10-CM

## 2014-03-02 DIAGNOSIS — I255 Ischemic cardiomyopathy: Secondary | ICD-10-CM | POA: Diagnosis present

## 2014-03-02 DIAGNOSIS — E039 Hypothyroidism, unspecified: Secondary | ICD-10-CM

## 2014-03-02 LAB — CBC WITH DIFFERENTIAL/PLATELET
Basophils Absolute: 0.1 10*3/uL (ref 0.0–0.1)
Basophils Absolute: 0.1 10*3/uL (ref 0.0–0.1)
Basophils Relative: 1 % (ref 0–1)
Basophils Relative: 1 % (ref 0–1)
EOS ABS: 0.2 10*3/uL (ref 0.0–0.7)
EOS PCT: 2 % (ref 0–5)
Eosinophils Absolute: 0.2 10*3/uL (ref 0.0–0.7)
Eosinophils Relative: 2 % (ref 0–5)
HCT: 34.6 % — ABNORMAL LOW (ref 36.0–46.0)
HEMATOCRIT: 33.8 % — AB (ref 36.0–46.0)
HEMOGLOBIN: 10.5 g/dL — AB (ref 12.0–15.0)
Hemoglobin: 10.9 g/dL — ABNORMAL LOW (ref 12.0–15.0)
LYMPHS ABS: 1 10*3/uL (ref 0.7–4.0)
LYMPHS PCT: 5 % — AB (ref 12–46)
Lymphocytes Relative: 10 % — ABNORMAL LOW (ref 12–46)
Lymphs Abs: 0.6 10*3/uL — ABNORMAL LOW (ref 0.7–4.0)
MCH: 29.4 pg (ref 26.0–34.0)
MCH: 29.8 pg (ref 26.0–34.0)
MCHC: 31.1 g/dL (ref 30.0–36.0)
MCHC: 31.5 g/dL (ref 30.0–36.0)
MCV: 94.5 fL (ref 78.0–100.0)
MCV: 94.7 fL (ref 78.0–100.0)
MONO ABS: 0.8 10*3/uL (ref 0.1–1.0)
MONOS PCT: 7 % (ref 3–12)
Monocytes Absolute: 0.7 10*3/uL (ref 0.1–1.0)
Monocytes Relative: 6 % (ref 3–12)
Neutro Abs: 10.5 10*3/uL — ABNORMAL HIGH (ref 1.7–7.7)
Neutro Abs: 8.7 10*3/uL — ABNORMAL HIGH (ref 1.7–7.7)
Neutrophils Relative %: 80 % — ABNORMAL HIGH (ref 43–77)
Neutrophils Relative %: 86 % — ABNORMAL HIGH (ref 43–77)
Platelets: 172 10*3/uL (ref 150–400)
Platelets: 188 10*3/uL (ref 150–400)
RBC: 3.57 MIL/uL — AB (ref 3.87–5.11)
RBC: 3.66 MIL/uL — ABNORMAL LOW (ref 3.87–5.11)
RDW: 16.8 % — ABNORMAL HIGH (ref 11.5–15.5)
RDW: 16.6 % — AB (ref 11.5–15.5)
WBC: 10.7 10*3/uL — ABNORMAL HIGH (ref 4.0–10.5)
WBC: 12.1 10*3/uL — ABNORMAL HIGH (ref 4.0–10.5)

## 2014-03-02 LAB — COMPREHENSIVE METABOLIC PANEL
ALBUMIN: 2.9 g/dL — AB (ref 3.5–5.2)
ALT: 24 U/L (ref 0–35)
ALT: 27 U/L (ref 0–35)
AST: 30 U/L (ref 0–37)
AST: 38 U/L — AB (ref 0–37)
Albumin: 3.1 g/dL — ABNORMAL LOW (ref 3.5–5.2)
Alkaline Phosphatase: 102 U/L (ref 39–117)
Alkaline Phosphatase: 95 U/L (ref 39–117)
BILIRUBIN TOTAL: 0.2 mg/dL — AB (ref 0.3–1.2)
BUN: 11 mg/dL (ref 6–23)
BUN: 15 mg/dL (ref 6–23)
CHLORIDE: 95 meq/L — AB (ref 96–112)
CHLORIDE: 93 meq/L — AB (ref 96–112)
CO2: 24 mEq/L (ref 19–32)
CO2: 25 meq/L (ref 19–32)
CREATININE: 2.18 mg/dL — AB (ref 0.50–1.10)
CREATININE: 1.87 mg/dL — AB (ref 0.50–1.10)
Calcium: 7.9 mg/dL — ABNORMAL LOW (ref 8.4–10.5)
Calcium: 7.9 mg/dL — ABNORMAL LOW (ref 8.4–10.5)
GFR calc Af Amer: 25 mL/min — ABNORMAL LOW (ref >90)
GFR calc non Af Amer: 22 mL/min — ABNORMAL LOW (ref >90)
GFR, EST AFRICAN AMERICAN: 30 mL/min — AB (ref >90)
GFR, EST NON AFRICAN AMERICAN: 26 mL/min — AB (ref >90)
GLUCOSE: 167 mg/dL — AB (ref 70–99)
Glucose, Bld: 147 mg/dL — ABNORMAL HIGH (ref 70–99)
Potassium: 3.9 mEq/L (ref 3.7–5.3)
Potassium: 4.1 mEq/L (ref 3.7–5.3)
Sodium: 132 mEq/L — ABNORMAL LOW (ref 137–147)
Sodium: 133 mEq/L — ABNORMAL LOW (ref 137–147)
Total Bilirubin: <0.2 mg/dL — ABNORMAL LOW (ref 0.3–1.2)
Total Protein: 6.3 g/dL (ref 6.0–8.3)
Total Protein: 6.7 g/dL (ref 6.0–8.3)

## 2014-03-02 LAB — GLUCOSE, CAPILLARY
GLUCOSE-CAPILLARY: 136 mg/dL — AB (ref 70–99)
GLUCOSE-CAPILLARY: 183 mg/dL — AB (ref 70–99)
Glucose-Capillary: 123 mg/dL — ABNORMAL HIGH (ref 70–99)
Glucose-Capillary: 213 mg/dL — ABNORMAL HIGH (ref 70–99)
Glucose-Capillary: 143 mg/dL — ABNORMAL HIGH (ref 70–99)

## 2014-03-02 LAB — TROPONIN I
Troponin I: <0.3 ng/mL (ref <0.30)
Troponin I: <0.3 ng/mL (ref <0.30)

## 2014-03-02 LAB — CULTURE, BLOOD (ROUTINE X 2): Culture: NO GROWTH

## 2014-03-02 LAB — CORTISOL: Cortisol, Plasma: 18.9 ug/dL

## 2014-03-02 MED ORDER — SIMVASTATIN 80 MG PO TABS
80.0000 mg | ORAL_TABLET | Freq: Every evening | ORAL | Status: DC
  Administered 2014-03-02 – 2014-03-03 (×2): 80 mg via ORAL
  Filled 2014-03-02 (×3): qty 1

## 2014-03-02 MED ORDER — MIDODRINE HCL 5 MG PO TABS
10.0000 mg | ORAL_TABLET | ORAL | Status: DC
  Administered 2014-03-03 (×2): 10 mg via ORAL
  Filled 2014-03-02 (×2): qty 2

## 2014-03-02 MED ORDER — NITROGLYCERIN 0.4 MG SL SUBL: 0.4000 mg | SUBLINGUAL_TABLET | SUBLINGUAL | Status: DC | PRN

## 2014-03-02 MED ORDER — INSULIN GLARGINE 100 UNIT/ML ~~LOC~~ SOLN
10.0000 [IU] | Freq: Every day | SUBCUTANEOUS | Status: DC
  Administered 2014-03-02 – 2014-03-03 (×2): 10 [IU] via SUBCUTANEOUS
  Filled 2014-03-02 (×3): qty 0.1

## 2014-03-02 MED ORDER — ALBUTEROL SULFATE (2.5 MG/3ML) 0.083% IN NEBU: 2.5000 mg | INHALATION_SOLUTION | Freq: Four times a day (QID) | RESPIRATORY_TRACT | Status: DC | PRN

## 2014-03-02 MED ORDER — CYCLOBENZAPRINE HCL 10 MG PO TABS: 10.0000 mg | ORAL_TABLET | Freq: Three times a day (TID) | ORAL | Status: DC | PRN

## 2014-03-02 MED ORDER — ACETAMINOPHEN 325 MG PO TABS
650.0000 mg | ORAL_TABLET | Freq: Four times a day (QID) | ORAL | Status: DC | PRN
  Administered 2014-03-03: 650 mg via ORAL
  Filled 2014-03-02: qty 2

## 2014-03-02 MED ORDER — SODIUM CHLORIDE 0.9 % IJ SOLN
3.0000 mL | Freq: Two times a day (BID) | INTRAMUSCULAR | Status: DC
  Administered 2014-03-02 – 2014-03-04 (×2): 3 mL via INTRAVENOUS

## 2014-03-02 MED ORDER — ENOXAPARIN SODIUM 30 MG/0.3ML ~~LOC~~ SOLN
30.0000 mg | SUBCUTANEOUS | Status: DC
  Administered 2014-03-02 – 2014-03-04 (×3): 30 mg via SUBCUTANEOUS
  Filled 2014-03-02 (×3): qty 0.3

## 2014-03-02 MED ORDER — ONDANSETRON HCL 4 MG/2ML IJ SOLN: 4.0000 mg | Freq: Four times a day (QID) | INTRAMUSCULAR | Status: DC | PRN

## 2014-03-02 MED ORDER — ONDANSETRON HCL 4 MG PO TABS: 4.0000 mg | ORAL_TABLET | Freq: Four times a day (QID) | ORAL | Status: DC | PRN

## 2014-03-02 MED ORDER — INSULIN ASPART 100 UNIT/ML ~~LOC~~ SOLN
0.0000 [IU] | Freq: Three times a day (TID) | SUBCUTANEOUS | Status: DC
  Administered 2014-03-02: 1 [IU] via SUBCUTANEOUS
  Administered 2014-03-02: 3 [IU] via SUBCUTANEOUS
  Administered 2014-03-03: 1 [IU] via SUBCUTANEOUS
  Administered 2014-03-03 – 2014-03-04 (×2): 2 [IU] via SUBCUTANEOUS
  Administered 2014-03-04: 1 [IU] via SUBCUTANEOUS

## 2014-03-02 MED ORDER — PANTOPRAZOLE SODIUM 40 MG PO TBEC
40.0000 mg | DELAYED_RELEASE_TABLET | Freq: Every day | ORAL | Status: DC
  Administered 2014-03-02 – 2014-03-04 (×3): 40 mg via ORAL
  Filled 2014-03-02 (×2): qty 1

## 2014-03-02 MED ORDER — LEVOTHYROXINE SODIUM 25 MCG PO TABS
25.0000 ug | ORAL_TABLET | Freq: Every day | ORAL | Status: DC
  Administered 2014-03-02 – 2014-03-04 (×3): 25 ug via ORAL
  Filled 2014-03-02 (×4): qty 1

## 2014-03-02 MED ORDER — RENA-VITE PO TABS
1.0000 | ORAL_TABLET | Freq: Every day | ORAL | Status: DC
  Administered 2014-03-02 – 2014-03-03 (×2): 1 via ORAL
  Filled 2014-03-02 (×3): qty 1

## 2014-03-02 MED ORDER — ACETAMINOPHEN 650 MG RE SUPP: 650.0000 mg | Freq: Four times a day (QID) | RECTAL | Status: DC | PRN

## 2014-03-02 MED ORDER — TIOTROPIUM BROMIDE MONOHYDRATE 18 MCG IN CAPS
18.0000 ug | ORAL_CAPSULE | Freq: Every day | RESPIRATORY_TRACT | Status: DC
  Administered 2014-03-02 – 2014-03-04 (×2): 18 ug via RESPIRATORY_TRACT
  Filled 2014-03-02: qty 5

## 2014-03-02 MED ORDER — CARVEDILOL 3.125 MG PO TABS
3.1250 mg | ORAL_TABLET | Freq: Two times a day (BID) | ORAL | Status: DC
  Administered 2014-03-02: 3.125 mg via ORAL
  Filled 2014-03-02 (×3): qty 1

## 2014-03-02 MED ORDER — SODIUM CHLORIDE 0.9 % IJ SOLN
3.0000 mL | Freq: Two times a day (BID) | INTRAMUSCULAR | Status: DC
Start: 2014-03-02 — End: 2014-03-04
  Administered 2014-03-02 – 2014-03-03 (×4): 3 mL via INTRAVENOUS

## 2014-03-02 MED ORDER — CALCIUM ACETATE 667 MG PO CAPS
667.0000 mg | ORAL_CAPSULE | Freq: Three times a day (TID) | ORAL | Status: DC
  Administered 2014-03-02 – 2014-03-04 (×7): 667 mg via ORAL
  Filled 2014-03-02 (×10): qty 1

## 2014-03-02 MED ORDER — ISOSORBIDE MONONITRATE 15 MG HALF TABLET
15.0000 mg | ORAL_TABLET | Freq: Every day | ORAL | Status: DC
  Administered 2014-03-02 – 2014-03-04 (×3): 15 mg via ORAL
  Filled 2014-03-02 (×3): qty 1

## 2014-03-02 MED ORDER — ASPIRIN EC 81 MG PO TBEC
81.0000 mg | DELAYED_RELEASE_TABLET | Freq: Every day | ORAL | Status: DC
  Administered 2014-03-02 – 2014-03-04 (×3): 81 mg via ORAL
  Filled 2014-03-02 (×3): qty 1

## 2014-03-02 NOTE — Progress Notes (Signed)
Patient admitted after midnight. Chart reviewed. Patient examined. Discussed with Drs. Deterding and Croitoru. Likely needs a loop recorder, as she was hypotensive and bradycardic yesterday and had some type of arrest about a week ago as well. Agree with stopping carvedilol.  Crista Curb, M.D. [facility] [phone]

## 2014-03-02 NOTE — ED Provider Notes (Addendum)
CSN: [id number]     Arrival date & time 03/01/14  2223 History   First MD Initiated Contact with Patient 03/01/14 2307     Chief Complaint  Patient presents with  . Weakness      HPI Patient is brought to the emergency department today after becoming unresponsive in route home with her son.  She was just discharged from the hospital earlier today.  She was hospitalized because of an event of unresponsiveness with hypotension.  No true etiology was found the patient did well in hospital.  Just prior to his last hospitalization she was hospitalized with a GI bleed and a non-ST elevation MI She states no preceding palpitations prior to today's event.  She denies weakness of her arms or legs.  He appears chills.  No nausea vomiting or diarrhea.  She states at this time she feels fine.  It sounds that when she initially presented emergency department her blood pressure was labile in the 80s.  She sees him IV fluids and this improved her symptoms.  No other complaints at this time.  No active symptoms currently.   Past Medical History  Diagnosis Date  . ESRD on hemodialysis     Started HD in December 2013.  Cause of ESRD unknown.  Gets HD in [place] on MWF schedule. She had a right upper arm access which failed just short of one year.  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  She says she has "poor circulation" in the legs so they won't do a leg access. She remains catheter dependent as of   . Hypothyroidism   . Colitis 10/2011    C diff.   . Coronary artery disease     hx angina  . Hypertension   . GERD (gastroesophageal reflux disease)   . Myocardial infarction   . Peripheral vascular disease     claudication.   Marland Kitchen Hx of pulmonary embolus   . COPD (chronic obstructive pulmonary disease)     Nocturnal O2.   . Shortness of breath     uses O2 as needed  . Critical lower limb ischemia   . DVT (deep venous  thrombosis) 2008    "right"  . Pneumonia     "several times"  . Type II diabetes mellitus     insulin requiring.   . CHF (congestive heart failure)     Volume mgt with HD  . Hypercholesteremia    Past Surgical History  Procedure Laterality Date  . Tubal ligation    . Toe amputation Right     5th toe  . Tonsillectomy    . Insertion of dialysis catheter Left     chest  . Av fistula placement Right [id number]    RUE  . Bascilic vein transposition  May 18, 2012    First stage  . Bascilic vein transposition Left 07/27/12    Basilic Vein Transposition  . Arch aortogram  08/19/2012    Procedure: ARCH AORTOGRAM;  Surgeon: Fransisco Hertz, MD;  Location: [facility] OR;  Service: Vascular;  Laterality: N/A;  . Insertion of dialysis catheter  08/21/2012    Procedure: INSERTION OF DIALYSIS CATHETER;  Surgeon: Sherren Kerns, MD;  Location: [facility] OR;  Service: Vascular;  Laterality: Left;  internal jugular  . Cardiac catheterization  02/04/2012    LAD 40/99%, CRX OK, OM1 90% (small), RCA 90%/subtotal, failed PCI. Good L-R  collaterals.  . Esophagogastroduodenoscopy N/A 01/06/2014    Procedure: ESOPHAGOGASTRODUODENOSCOPY (EGD);  Surgeon: Hilarie Fredrickson, MD;  Location: [facility] ENDOSCOPY;  Service: Endoscopy;  Laterality: N/A;   Family History  Problem Relation Age of Onset  . Diabetes type II    . Hypertension    . Diabetes Mother   . Kidney disease Mother   . Diabetes Father   . Anesthesia problems Neg Hx    History  Substance Use Topics  . Smoking status: Former Smoker -- 1.00 packs/day for 40 years    Types: Cigarettes    Quit date: 06/11/2011  . Smokeless tobacco: Never Used  . Alcohol Use: No   OB History   Grav Para Term Preterm Abortions TAB SAB Ect Mult Living                 Review of Systems  All other systems reviewed and are negative.     Allergies  Procaine hcl  Home Medications   Prior to Admission medications   Medication Sig Start Date End Date Taking? Authorizing Provider   aspirin EC 81 MG tablet Take 81 mg by mouth daily.   Yes Historical Provider, MD  calcium acetate (PHOSLO) 667 MG capsule Take 667 mg by mouth 3 (three) times daily with meals.   Yes Historical Provider, MD  carvedilol (COREG) 3.125 MG tablet Take 1 tablet (3.125 mg total) by mouth 2 (two) times daily with a meal. 03/01/14  Yes Esperanza Sheets, MD  insulin aspart (NOVOLOG) 100 UNIT/ML injection Inject 2 Units into the skin 3 (three) times daily with meals. 12/30/13  Yes Alison Murray, MD  insulin glargine (LANTUS) 100 UNIT/ML injection Inject 0.1 mLs (10 Units total) into the skin at bedtime. 03/01/14  Yes Esperanza Sheets, MD  levothyroxine (SYNTHROID, LEVOTHROID) 25 MCG tablet Take 25 mcg by mouth daily.    Yes Historical Provider, MD  midodrine (PROAMATINE) 5 MG tablet Take 10 mg by mouth 2 (two) times daily with a meal. On dialysis days. Monday,wednesday and friday   Yes Historical Provider, MD  multivitamin (RENA-VIT) TABS tablet Take 1 tablet by mouth at bedtime. 12/30/13  Yes Alison Murray, MD  nitroGLYCERIN (NITROSTAT) 0.4 MG SL tablet Place 0.4 mg under the tongue every 5 (five) minutes as needed for chest pain. For chest pain.   Yes Historical Provider, MD  pantoprazole (PROTONIX) 40 MG tablet Take 40 mg by mouth daily.    Yes Historical Provider, MD  Probiotic Product (PROBIOTIC PO) Take 1 capsule by mouth daily as needed (digestion).    Yes Historical Provider, MD  simvastatin (ZOCOR) 80 MG tablet Take 1 tablet (80 mg total) by mouth every evening. 01/08/14  Yes Hollice Espy, MD  tiotropium (SPIRIVA) 18 MCG inhalation capsule Place 1 capsule (18 mcg total) into inhaler and inhale daily. 11/13/13  Yes Rhetta Mura, MD  albuterol (PROVENTIL) (5 MG/ML) 0.5% nebulizer solution Take 0.5 mLs (2.5 mg total) by nebulization every 6 (six) hours as needed for wheezing or shortness of breath. 03/01/14   Esperanza Sheets, MD  cyclobenzaprine (FLEXERIL) 10 MG tablet Take 1 tablet (10 mg total) by  mouth 3 (three) times daily as needed for muscle spasms. 03/01/14   Esperanza Sheets, MD  isosorbide mononitrate (IMDUR) 15 mg TB24 24 hr tablet Take 0.5 tablets (15 mg total) by mouth daily. 03/01/14   Esperanza Sheets, MD   BP 121/58  Pulse 85  Temp(Src) 98.2 F (36.8 C) (Oral)  Resp 12  SpO2 97% Physical Exam  Nursing note and vitals reviewed. Constitutional: She is oriented to person, place, and time. She appears well-developed and well-nourished. No distress.  HENT:  Head: Normocephalic and atraumatic.  Eyes: EOM are normal.  Neck: Normal range of motion.  Cardiovascular: Normal rate, regular rhythm and normal heart sounds.   Pulmonary/Chest: Effort normal and breath sounds normal.  Abdominal: Soft. She exhibits no distension. There is no tenderness.  Musculoskeletal: Normal range of motion.  Neurological: She is alert and oriented to person, place, and time.  Skin: Skin is warm and dry.  Psychiatric: She has a normal mood and affect. Judgment normal.    ED Course  Procedures (including critical care time) Labs Review Labs Reviewed  CBC WITH DIFFERENTIAL - Abnormal; Notable for the following:    WBC 12.1 (*)    RBC 3.66 (*)    Hemoglobin 10.9 (*)    HCT 34.6 (*)    RDW 16.6 (*)    Neutrophils Relative % 86 (*)    Lymphocytes Relative 5 (*)    Neutro Abs 10.5 (*)    Lymphs Abs 0.6 (*)    All other components within normal limits  COMPREHENSIVE METABOLIC PANEL - Abnormal; Notable for the following:    Sodium 133 (*)    Chloride 93 (*)    Glucose, Bld 167 (*)    Creatinine, Ser 1.87 (*)    Calcium 7.9 (*)    Albumin 3.1 (*)    AST 38 (*)    Total Bilirubin 0.2 (*)    GFR calc non Af Amer 26 (*)    GFR calc Af Amer 30 (*)    All other components within normal limits  TROPONIN I    Imaging Review Dg Chest 2 View  03/02/2014   CLINICAL DATA:  Weakness, shortness of breath  EXAM: CHEST  2 VIEW  COMPARISON:  Prior radiograph from 02/28/2014  FINDINGS: Left-sided  hemodialysis catheter again noted. Cardiomegaly is stable. Mediastinal silhouette within normal limits.  Veiling opacity overlying the left hemidiaphragm is compatible with moderate left pleural effusion. A smaller right pleural effusion is likely present as well. There is mild central perihilar vascular congestion without overt pulmonary edema. No focal infiltrate identified. There is no pneumothorax.  Osseous structures are unchanged.  IMPRESSION: 1. Moderate bilateral pleural effusions, left greater than right. 2. Stable cardiomegaly without overt pulmonary edema.   Electronically Signed   By: Rise MuBenjamin  McClintock M.D.   On: 03/02/2014 00:20   Dg Chest 2 View  02/28/2014   CLINICAL DATA:  Over clip chest pain, shortness of breath, history hypertension, CHF, diabetes, COPD, former smoker, end-stage renal disease on dialysis  EXAM: CHEST  2 VIEW  COMPARISON:  02/25/2014  FINDINGS: RIGHT jugular dual-lumen central venous catheter with distal tip projecting over the RIGHT atrium.  Enlargement of cardiac silhouette with pulmonary vascular congestion.  Atherosclerotic calcifications aorta.  Bibasilar effusions and atelectasis.  Peribronchial thickening with improved perihilar edema since previous exam.  No pneumothorax.  Bones demineralized.  IMPRESSION: Enlargement of cardiac silhouette with pulmonary vascular congestion.  Improved pulmonary edema with persistent bibasilar effusions and atelectasis.   Electronically Signed   By: Ulyses SouthwardMark  Boles M.D.   On: 02/28/2014 15:29  I personally reviewed the imaging tests through PACS system I reviewed available ER/hospitalization records through the EMR    EKG Interpretation   Date/Time:  Wednesday March 01 2014 22:33:48 EDT Ventricular Rate:  88 PR Interval:  142 QRS Duration:  108 QT Interval:  429 QTC Calculation: 519 R Axis:   68 Text Interpretation:  Sinus rhythm Probable left atrial enlargement LVH  with secondary repolarization abnormality Anterior Q  waves, possibly due  to LVH Prolonged QT interval ED PHYSICIAN INTERPRETATION AVAILABLE IN [facility] Confirmed by TEST, Record ([id number]) on 03/03/2014 12:31:17 PM      MDM   Final diagnoses:  Weakness    We will admit the patient for her transient hypotension and unresponsiveness at home. Patient is asymptomatic at this time.  Normal neurologic exam    Lyanne Co, MD 03/04/14 [id number]  Lyanne Co, MD 03/04/14 [phone]

## 2014-03-02 NOTE — Care Management Note (Unsigned)
    Page 1 of 1   03/02/2014     3:03:59 PM CARE MANAGEMENT NOTE 03/02/2014  Patient:  [name] [name], [name] [name]   Account Number:  [id number]  Date Initiated:  03/02/2014  Documentation initiated by:  Tychelle Purkey  Subjective/Objective Assessment:   Pt adm on 03/01/14 with syncopal episode.  PTA, pt resides at home with son.     Action/Plan:   Will follow for dc needs as pt progresses.   Anticipated DC Date:  03/03/2014   Anticipated DC Plan:  HOME/SELF CARE      DC Planning Services  CM consult      Choice offered to / List presented to:             Status of service:  In process, will continue to follow Medicare Important Message given?   (If response is "NO", the following Medicare IM given date fields will be blank) Date Medicare IM given:   Date Additional Medicare IM given:    Discharge Disposition:    Per UR Regulation:  Reviewed for med. necessity/level of care/duration of stay  If discussed at Long Length of Stay Meetings, dates discussed:    Comments:

## 2014-03-02 NOTE — ED Notes (Signed)
Pt back from X-ray.  

## 2014-03-02 NOTE — Consult Note (Signed)
Reason for Consult: Recurrent syncope  Requesting Physician: Mary Sella  HPI: This is a 73 y.o. female with a past medical history significant for ESRD, on HD since 12/13, CAD- s/p cath and unsuccessful PCI attempt of the RCA March 2013. At that time she had chronic ST occlusion in the RCA, 90% (small) OM2, and the LAD 40% proximal, 99% distal. Her EF has been 30-35%. In March 2015 she presented with a GI bleed and Type 2 NSTEMI. She was just admitted 02/24/14 after an arrest while at HD. She had brief CPR. She was awake and stable in the ER then became bradycardic, hypotensive, and unresponsive in th ER. She was admitted to [facility] service. She ruled out for an MI. EF was 30-35% by echo. She was discharged 03/01/14 on Coreg 3.125 mg BID and Imdur 15 mg- both new for her. She was back in the ER at 2 am on the 30th after she became unresponsive again at home. She again was hypotensive and bradycardic per EMS and ER records. She says she has no warning- no chest pain or SOB before these episodes.   PMHx:  Past Medical History  Diagnosis Date  . ESRD on hemodialysis     Started HD in December 2013.  Cause of ESRD unknown.  Gets HD in [place] on MWF schedule. She had a right upper arm access which failed just short of one year.  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  She says she has "poor circulation" in the legs so they won't do a leg access. She remains catheter dependent as of   . Hypothyroidism   . Colitis 10/2011    C diff.   . Coronary artery disease 3/13    3V CAD at cath, failed RCA PCI  . Hypertension   . GERD (gastroesophageal reflux disease)   . Myocardial infarction   . Peripheral vascular disease     claudication.   Marland Kitchen Hx of pulmonary embolus   . COPD (chronic obstructive pulmonary disease)     Nocturnal O2.   . Shortness of breath     uses O2 as needed  . Critical lower limb ischemia   . DVT (deep  venous thrombosis) 2008    "right"  . Pneumonia     "several times"  . Type II diabetes mellitus     insulin requiring.   . CHF (congestive heart failure)     Volume mgt with HD  . Hypercholesteremia   . GI bleed March 2015   Past Surgical History  Procedure Laterality Date  . Tubal ligation    . Toe amputation Right     5th toe  . Tonsillectomy    . Insertion of dialysis catheter Left     chest  . Av fistula placement Right [id number]    RUE  . Bascilic vein transposition  May 18, 2012    First stage  . Bascilic vein transposition Left 5/80/99    Basilic Vein Transposition  . Arch aortogram  08/19/2012    Procedure: ARCH AORTOGRAM;  Surgeon: Conrad Dierks, MD;  Location: [facility];  Service: Vascular;  Laterality: N/A;  . Insertion of dialysis catheter  08/21/2012    Procedure: INSERTION OF DIALYSIS CATHETER;  Surgeon: Elam Dutch, MD;  Location: [place];  Service: Vascular;  Laterality: Left;  internal jugular  . Cardiac catheterization  02/04/2012  LAD 40/99%, CRX OK, OM1 90% (small), RCA 90%/subtotal, failed PCI. Good L-R collaterals.  . Esophagogastroduodenoscopy N/A 01/06/2014    Procedure: ESOPHAGOGASTRODUODENOSCOPY (EGD);  Surgeon: Irene Shipper, MD;  Location: [facility] ENDOSCOPY;  Service: Endoscopy;  Laterality: N/A;    FAMHx: Remarkable for HTN, diabetes, and renal disease   SOCHx:  reports that she quit smoking about 2 years ago. Her smoking use included Cigarettes. She has a 40 pack-year smoking history. She has never used smokeless tobacco. She reports that she does not drink alcohol or use illicit drugs.  ALLERGIES: Allergies  Allergen Reactions  . Procaine Hcl Nausea And Vomiting and Other (See Comments)    [facility] has reaction of "comatose"    ROS: A comprehensive review of systems was negative. See admission H&P for complete ROS  HOME MEDICATIONS: Prescriptions prior to admission  Medication Sig Dispense Refill  . aspirin EC 81 MG tablet  Take 81 mg by mouth daily.      . calcium acetate (PHOSLO) 667 MG capsule Take 667 mg by mouth 3 (three) times daily with meals.      . carvedilol (COREG) 3.125 MG tablet Take 1 tablet (3.125 mg total) by mouth 2 (two) times daily with a meal.  60 tablet  1  . insulin aspart (NOVOLOG) 100 UNIT/ML injection Inject 2 Units into the skin 3 (three) times daily with meals.  10 mL  1  . insulin glargine (LANTUS) 100 UNIT/ML injection Inject 0.1 mLs (10 Units total) into the skin at bedtime.  10 mL  1  . levothyroxine (SYNTHROID, LEVOTHROID) 25 MCG tablet Take 25 mcg by mouth daily.       . midodrine (PROAMATINE) 5 MG tablet Take 10 mg by mouth 2 (two) times daily with a meal. On dialysis days. Monday,wednesday and friday      . multivitamin (RENA-VIT) TABS tablet Take 1 tablet by mouth at bedtime.  30 tablet  0  . nitroGLYCERIN (NITROSTAT) 0.4 MG SL tablet Place 0.4 mg under the tongue every 5 (five) minutes as needed for chest pain. For chest pain.      . pantoprazole (PROTONIX) 40 MG tablet Take 40 mg by mouth daily.       . Probiotic Product (PROBIOTIC PO) Take 1 capsule by mouth daily as needed (digestion).       . simvastatin (ZOCOR) 80 MG tablet Take 1 tablet (80 mg total) by mouth every evening.  30 tablet  0  . tiotropium (SPIRIVA) 18 MCG inhalation capsule Place 1 capsule (18 mcg total) into inhaler and inhale daily.  30 capsule  12  . albuterol (PROVENTIL) (5 MG/ML) 0.5% nebulizer solution Take 0.5 mLs (2.5 mg total) by nebulization every 6 (six) hours as needed for wheezing or shortness of breath.  20 mL  12  . cyclobenzaprine (FLEXERIL) 10 MG tablet Take 1 tablet (10 mg total) by mouth 3 (three) times daily as needed for muscle spasms.  30 tablet  0  . isosorbide mononitrate (IMDUR) 15 mg TB24 24 hr tablet Take 0.5 tablets (15 mg total) by mouth daily.  30 tablet  2    HOSPITAL MEDICATIONS: I have reviewed the patient's current medications.  VITALS: Blood pressure 119/57, pulse 88,  temperature 98.2 F (36.8 C), temperature source Oral, resp. rate 19, height 5' 5" (1.651 m), weight 142 lb 13.7 oz (64.8 kg), SpO2 98.00%.  PHYSICAL EXAM: General appearance: alert, cooperative, no distress, moderately obese and areas of ecchymosis on her face and neck  Neck: bilat carotid bruits Lungs: clear to auscultation bilaterally Heart: regular rate and rhythm Abdomen: obese, non tender, ecchymosis secondary to Lovenox injections Extremities: no edema Pulses: diminnished distal pulses, bilat FA bruits Skin: pale cool and dry Neurologic: Grossly normal  LABS: Results for orders placed during the hospital encounter of 03/01/14 (from the past 48 hour(s))  CBC WITH DIFFERENTIAL     Status: Abnormal   Collection Time    03/01/14 11:50 PM      Result Value Ref Range   WBC 12.1 (*) 4.0 - 10.5 K/uL   Comment: WHITE COUNT CONFIRMED ON SMEAR   RBC 3.66 (*) 3.87 - 5.11 MIL/uL   Hemoglobin 10.9 (*) 12.0 - 15.0 g/dL   HCT 34.6 (*) 36.0 - 46.0 %   MCV 94.5  78.0 - 100.0 fL   MCH 29.8  26.0 - 34.0 pg   MCHC 31.5  30.0 - 36.0 g/dL   RDW 16.6 (*) 11.5 - 15.5 %   Platelets 188  150 - 400 K/uL   Comment: PLATELET COUNT CONFIRMED BY SMEAR     LARGE PLATELETS PRESENT   Neutrophils Relative % 86 (*) 43 - 77 %   Lymphocytes Relative 5 (*) 12 - 46 %   Monocytes Relative 6  3 - 12 %   Eosinophils Relative 2  0 - 5 %   Basophils Relative 1  0 - 1 %   Neutro Abs 10.5 (*) 1.7 - 7.7 K/uL   Lymphs Abs 0.6 (*) 0.7 - 4.0 K/uL   Monocytes Absolute 0.7  0.1 - 1.0 K/uL   Eosinophils Absolute 0.2  0.0 - 0.7 K/uL   Basophils Absolute 0.1  0.0 - 0.1 K/uL   Smear Review LARGE PLATELETS PRESENT    COMPREHENSIVE METABOLIC PANEL     Status: Abnormal   Collection Time    03/01/14 11:50 PM      Result Value Ref Range   Sodium 133 (*) 137 - 147 mEq/L   Potassium 4.1  3.7 - 5.3 mEq/L   Comment: HEMOLYSIS AT THIS LEVEL MAY AFFECT RESULT   Chloride 93 (*) 96 - 112 mEq/L   CO2 25  19 - 32 mEq/L   Glucose,  Bld 167 (*) 70 - 99 mg/dL   BUN 11  6 - 23 mg/dL   Creatinine, Ser 1.87 (*) 0.50 - 1.10 mg/dL   Calcium 7.9 (*) 8.4 - 10.5 mg/dL   Total Protein 6.7  6.0 - 8.3 g/dL   Albumin 3.1 (*) 3.5 - 5.2 g/dL   AST 38 (*) 0 - 37 U/L   Comment: HEMOLYSIS AT THIS LEVEL MAY AFFECT RESULT   ALT 27  0 - 35 U/L   Comment: HEMOLYSIS AT THIS LEVEL MAY AFFECT RESULT   Alkaline Phosphatase 102  39 - 117 U/L   Total Bilirubin 0.2 (*) 0.3 - 1.2 mg/dL   GFR calc non Af Amer 26 (*) >90 mL/min   GFR calc Af Amer 30 (*) >90 mL/min   Comment: (NOTE)     The eGFR has been calculated using the CKD EPI equation.     This calculation has not been validated in all clinical situations.     eGFR's persistently <90 mL/min signify possible Chronic Kidney     Disease.  TROPONIN I     Status: None   Collection Time    03/01/14 11:50 PM      Result Value Ref Range   Troponin I <0.30  <0.30 ng/mL  Comment:            Due to the release kinetics of cTnI,     a negative result within the first hours     of the onset of symptoms does not rule out     myocardial infarction with certainty.     If myocardial infarction is still suspected,     repeat the test at appropriate intervals.  TROPONIN I     Status: None   Collection Time    03/02/14  4:42 AM      Result Value Ref Range   Troponin I <0.30  <0.30 ng/mL   Comment:            Due to the release kinetics of cTnI,     a negative result within the first hours     of the onset of symptoms does not rule out     myocardial infarction with certainty.     If myocardial infarction is still suspected,     repeat the test at appropriate intervals.  COMPREHENSIVE METABOLIC PANEL     Status: Abnormal   Collection Time    03/02/14  4:42 AM      Result Value Ref Range   Sodium 132 (*) 137 - 147 mEq/L   Potassium 3.9  3.7 - 5.3 mEq/L   Chloride 95 (*) 96 - 112 mEq/L   CO2 24  19 - 32 mEq/L   Glucose, Bld 147 (*) 70 - 99 mg/dL   BUN 15  6 - 23 mg/dL   Creatinine, Ser  2.18 (*) 0.50 - 1.10 mg/dL   Calcium 7.9 (*) 8.4 - 10.5 mg/dL   Total Protein 6.3  6.0 - 8.3 g/dL   Albumin 2.9 (*) 3.5 - 5.2 g/dL   AST 30  0 - 37 U/L   ALT 24  0 - 35 U/L   Alkaline Phosphatase 95  39 - 117 U/L   Total Bilirubin <0.2 (*) 0.3 - 1.2 mg/dL   GFR calc non Af Amer 22 (*) >90 mL/min   GFR calc Af Amer 25 (*) >90 mL/min   Comment: (NOTE)     The eGFR has been calculated using the CKD EPI equation.     This calculation has not been validated in all clinical situations.     eGFR's persistently <90 mL/min signify possible Chronic Kidney     Disease.  CBC WITH DIFFERENTIAL     Status: Abnormal   Collection Time    03/02/14  4:42 AM      Result Value Ref Range   WBC 10.7 (*) 4.0 - 10.5 K/uL   RBC 3.57 (*) 3.87 - 5.11 MIL/uL   Hemoglobin 10.5 (*) 12.0 - 15.0 g/dL   HCT 33.8 (*) 36.0 - 46.0 %   MCV 94.7  78.0 - 100.0 fL   MCH 29.4  26.0 - 34.0 pg   MCHC 31.1  30.0 - 36.0 g/dL   RDW 16.8 (*) 11.5 - 15.5 %   Platelets 172  150 - 400 K/uL   Neutrophils Relative % 80 (*) 43 - 77 %   Neutro Abs 8.7 (*) 1.7 - 7.7 K/uL   Lymphocytes Relative 10 (*) 12 - 46 %   Lymphs Abs 1.0  0.7 - 4.0 K/uL   Monocytes Relative 7  3 - 12 %   Monocytes Absolute 0.8  0.1 - 1.0 K/uL   Eosinophils Relative 2  0 - 5 %   Eosinophils Absolute 0.2  0.0 - 0.7 K/uL   Basophils Relative 1  0 - 1 %   Basophils Absolute 0.1  0.0 - 0.1 K/uL  GLUCOSE, CAPILLARY     Status: Abnormal   Collection Time    03/02/14  6:00 AM      Result Value Ref Range   Glucose-Capillary 136 (*) 70 - 99 mg/dL  GLUCOSE, CAPILLARY     Status: Abnormal   Collection Time    03/02/14  6:57 AM      Result Value Ref Range   Glucose-Capillary 123 (*) 70 - 99 mg/dL  CORTISOL     Status: None   Collection Time    03/02/14  8:00 AM      Result Value Ref Range   Cortisol, Plasma 18.9     Comment: (NOTE)     AM:  4.3 - 22.4 ug/dL     PM:  3.1 - 16.7 ug/dL     Performed at [facility]  TROPONIN I     Status: None    Collection Time    03/02/14 10:32 AM      Result Value Ref Range   Troponin I <0.30  <0.30 ng/mL   Comment:            Due to the release kinetics of cTnI,     a negative result within the first hours     of the onset of symptoms does not rule out     myocardial infarction with certainty.     If myocardial infarction is still suspected,     repeat the test at appropriate intervals.  GLUCOSE, CAPILLARY     Status: Abnormal   Collection Time    03/02/14 11:36 AM      Result Value Ref Range   Glucose-Capillary 183 (*) 70 - 99 mg/dL  GLUCOSE, CAPILLARY     Status: Abnormal   Collection Time    03/02/14  4:23 PM      Result Value Ref Range   Glucose-Capillary 213 (*) 70 - 99 mg/dL   Comment 1 Notify RN      EKG: NSR, LVH  IMAGING: Dg Chest 2 View  03/02/2014   CLINICAL DATA:  Weakness, shortness of breath  EXAM: CHEST  2 VIEW  COMPARISON:  Prior radiograph from 02/28/2014  FINDINGS: Left-sided hemodialysis catheter again noted. Cardiomegaly is stable. Mediastinal silhouette within normal limits.  Veiling opacity overlying the left hemidiaphragm is compatible with moderate left pleural effusion. A smaller right pleural effusion is likely present as well. There is mild central perihilar vascular congestion without overt pulmonary edema. No focal infiltrate identified. There is no pneumothorax.  Osseous structures are unchanged.  IMPRESSION: 1. Moderate bilateral pleural effusions, left greater than right. 2. Stable cardiomegaly without overt pulmonary edema.   Electronically Signed   By: Jeannine Boga M.D.   On: 03/02/2014 00:20    IMPRESSION: Principal Problem:   Syncope- recurrent Active Problems:   Hypotension   CAD- NSTEMI March 2015, severe 3V disease at cath March 2013 (failed RCA PCI then)   Diabetes mellitus- IDDM   ESRD on hemodialysis- (MWF in [place])   GI bleed- [place] 2015- not Coumadin candidate   Cardiac arrest with hypotension/ transient bradycardia- brief CPR-  02/24/14   Cardiomyopathy, ischemic- EF 30-35%   PVD- mild to moderate ICA disease, severe LE disease   History of PE- (although CTA negative for PE Jan 2015)   RECOMMENDATION: Will review with MD- ? Loop recorder, episodes  sound like they are secondary to bradycardia. Coreg has been discontinued.   Time Spent Directly with Patient: 52 minutes  Erlene Quan [phone] beeper 03/02/2014, 4:52 PM   Patient examined chart reviewed.  Difficult situation with non revascularized CAD low EF and recurrent bradycardic episodes.  Echo 1/15 suggest PA pressure 46 but trivial TR on echo 3/15.  Will ask EP to see regarding possible leadless pacer or more definitive RX.  She cannot take beta blockers or nitrates to Rx CAD and has had failed PCI Would appear to be no way to prevent rehospitalization outside of at minimum pacing and at most AICD ? Single Lead device.  Has had Left subclavian dialysis catheter in for a year and failed grafts in both [place]

## 2014-03-02 NOTE — H&P (Signed)
[facility] History and Physical  [name] [name] UJW:[id number] DOB: 05/31/1941 DOA: 03/01/2014  Referring physician: ER physician. PCP: [name],[name], MD   Chief Complaint: Unresponsiveness.  HPI: [name] [name] is a 73 y.o. female with history of ESRD on hemodialysis who was discharged yesterday and became unresponsive transiently after reaching home as witnessed by patient's son. EMS was called and as per the ER physician EMS recorded a blood pressure of 60 systolic when patient was bradycardic. Patient spontaneously recovered consciousness and was brought to the ER. In the ER patient's blood pressure improved slowly without intervention. On exam patient is nonfocal and follows commands. Denies any chest pain or shortness of breath nausea vomiting abdominal pain or diarrhea. Patient was admitted last week with unresponsiveness and had CPR done. Patient was initially treated for pulmonary med possible pneumonia. Today's chest x-ray does not show any infiltrates and EKG shows no new changes.   Review of Systems: As presented in the history of presenting illness, rest negative.  Past Medical History  Diagnosis Date  . ESRD on hemodialysis     Started HD in December 2013.  Cause of ESRD unknown.  [place] in [place] MWF schedule. She had a right upper arm access which failed just short of one year.  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  She says she has "poor circulation" in the legs so they won't do a leg access. She remains catheter dependent as of   . Hypothyroidism   . Colitis 10/2011    C diff.   . Coronary artery disease     hx angina  . Hypertension   . GERD (gastroesophageal reflux disease)   . Myocardial infarction   . Peripheral vascular disease     claudication.   Marland Kitchen. Hx of pulmonary embolus   . COPD (chronic obstructive pulmonary disease)     Nocturnal O2.   . Shortness of breath     uses  O2 as needed  . Critical lower limb ischemia   . DVT (deep venous thrombosis) 2008    "right"  . Pneumonia     "several times"  . Type II diabetes mellitus     insulin requiring.   . CHF (congestive heart failure)     Volume mgt with HD  . Hypercholesteremia    Past Surgical History  Procedure Laterality Date  . Tubal ligation    . Toe amputation Right     5th toe  . Tonsillectomy    . Insertion of dialysis catheter Left     chest  . Av fistula placement Right [id number]    RUE  . Bascilic vein transposition  May 18, 2012    First stage  . Bascilic vein transposition Left 07/27/12    Basilic Vein Transposition  . Arch aortogram  08/19/2012    Procedure: ARCH AORTOGRAM;  Surgeon: Fransisco HertzBrian L Chen, MD;  [facility];  Service: Vascular;  Laterality: N/A;  . Insertion of dialysis catheter  08/21/2012    Procedure: INSERTION OF DIALYSIS CATHETER;  Surgeon: Sherren Kernsharles E Fields, MD;  [facility];  Service: Vascular;  Laterality: Left;  internal jugular  . Cardiac catheterization  02/04/2012    LAD 40/99%, CRX OK, OM1 90% (small), RCA 90%/subtotal, failed PCI. Good L-R collaterals.  . Esophagogastroduodenoscopy N/A 01/06/2014    Procedure: ESOPHAGOGASTRODUODENOSCOPY (EGD);  Surgeon: Hilarie FredricksonJohn N Perry, MD;  Location:  [facility] ENDOSCOPY;  Service: Endoscopy;  Laterality: N/A;   Social History:  reports that she quit smoking about 2 years ago. Her smoking use included Cigarettes. She has a 40 pack-year smoking history. She has never used smokeless tobacco. She reports that she does not drink alcohol or use illicit drugs. Where does patient live home. Can patient participate in ADLs? Not sure.  Allergies  Allergen Reactions  . Procaine Hcl Nausea And Vomiting and Other (See Comments)    [facility] has reaction of "comatose"    Family History:  Family History  Problem Relation Age of Onset  . Diabetes type II    . Hypertension    . Diabetes Mother   . Kidney disease Mother   .  Diabetes Father   . Anesthesia problems Neg Hx       Prior to Admission medications   Medication Sig Start Date End Date Taking? Authorizing Provider  aspirin EC 81 MG tablet Take 81 mg by mouth daily.   Yes Historical Provider, MD  calcium acetate (PHOSLO) 667 MG capsule Take 667 mg by mouth 3 (three) times daily with meals.   Yes Historical Provider, MD  carvedilol (COREG) 3.125 MG tablet Take 1 tablet (3.125 mg total) by mouth 2 (two) times daily with a meal. 03/01/14  Yes Esperanza Sheets, MD  insulin aspart (NOVOLOG) 100 UNIT/ML injection Inject 2 Units into the skin 3 (three) times daily with meals. 12/30/13  Yes Alison Murray, MD  insulin glargine (LANTUS) 100 UNIT/ML injection Inject 0.1 mLs (10 Units total) into the skin at bedtime. 03/01/14  Yes Esperanza Sheets, MD  levothyroxine (SYNTHROID, LEVOTHROID) 25 MCG tablet Take 25 mcg by mouth daily.    Yes Historical Provider, MD  midodrine (PROAMATINE) 5 MG tablet Take 10 mg by mouth 2 (two) times daily with a meal. On dialysis days. Monday,wednesday and friday   Yes Historical Provider, MD  multivitamin (RENA-VIT) TABS tablet Take 1 tablet by mouth at bedtime. 12/30/13  Yes Alison Murray, MD  nitroGLYCERIN (NITROSTAT) 0.4 MG SL tablet Place 0.4 mg under the tongue every 5 (five) minutes as needed for chest pain. For chest pain.   Yes Historical Provider, MD  pantoprazole (PROTONIX) 40 MG tablet Take 40 mg by mouth daily.    Yes Historical Provider, MD  Probiotic Product (PROBIOTIC PO) Take 1 capsule by mouth daily as needed (digestion).    Yes Historical Provider, MD  simvastatin (ZOCOR) 80 MG tablet Take 1 tablet (80 mg total) by mouth every evening. 01/08/14  Yes Hollice Espy, MD  tiotropium (SPIRIVA) 18 MCG inhalation capsule Place 1 capsule (18 mcg total) into inhaler and inhale daily. 11/13/13  Yes Rhetta Mura, MD  albuterol (PROVENTIL) (5 MG/ML) 0.5% nebulizer solution Take 0.5 mLs (2.5 mg total) by nebulization every 6 (six)  hours as needed for wheezing or shortness of breath. 03/01/14   Esperanza Sheets, MD  cyclobenzaprine (FLEXERIL) 10 MG tablet Take 1 tablet (10 mg total) by mouth 3 (three) times daily as needed for muscle spasms. 03/01/14   Esperanza Sheets, MD  isosorbide mononitrate (IMDUR) 15 mg TB24 24 hr tablet Take 0.5 tablets (15 mg total) by mouth daily. 03/01/14   Esperanza Sheets, MD    Physical Exam: Filed Vitals:   03/02/14 0200 03/02/14 0230 03/02/14 0245 03/02/14 0313  BP: 116/59 107/57 120/57 96/48  Pulse: 85 84 83 92  Temp:    97.3 F (36.3 C)  TempSrc:  Oral  Resp: 19 25 26 24   Height:    5\' 5"  (1.651 m)  Weight:    64.8 kg (142 lb 13.7 oz)  SpO2: 96% 96% 97% 92%     General:  Well-developed moderately nourished.  Eyes: Anicteric no pallor.  ENT: No discharge from the ears eyes nose mouth.  Neck: No mass felt.  Cardiovascular: S1-S2 heard.  Respiratory: No rhonchi or crepitations.  Abdomen: Soft nontender bowel sounds present. No guarding or rigidity.  Skin: No rash.  Musculoskeletal: No edema.  Psychiatric: Appears normal.  Neurologic: Alert oriented to time place and person. Moves all extremities.  Labs on Admission:  Basic Metabolic Panel:  Recent Labs Lab 02/24/14 0900 02/24/14 1429 02/25/14 0329 02/26/14 0335 02/27/14 0407 02/28/14 0615 03/01/14 2350  NA  --  142 138 135* 131* 135* 133*  K  --  4.1 4.3 3.7 3.7 4.5 4.1  CL  --  98 98 95* 92* 97 93*  CO2  --  27 26 25 26 28 25   GLUCOSE  --  104* 161* 177* 105* 89 167*  BUN  --  6 15 15  26* 14 11  CREATININE  --  0.95 2.04* 2.05* 3.01* 2.42* 1.87*  CALCIUM  --  8.6 8.6 9.0 8.3* 8.4 7.9*  MG 1.8 1.8 1.8 1.8 1.9  --   --   PHOS 4.5 1.0* 2.9 1.5*  --   --   --    Liver Function Tests:  Recent Labs Lab 02/24/14 0740 03/01/14 2350  AST 44* 38*  ALT 25 27  ALKPHOS 61 102  BILITOT <0.2* 0.2*  PROT 3.9* 6.7  ALBUMIN 2.0* 3.1*    Recent Labs Lab 02/24/14 0900  LIPASE 23   No results found  for this basename: AMMONIA,  in the last 168 hours CBC:  Recent Labs Lab 02/24/14 0740 02/25/14 0329 02/26/14 0335 02/27/14 0407 02/28/14 0615 03/01/14 2350  WBC 11.4* 11.3* 10.1 9.1 9.8 12.1*  NEUTROABS 9.6* 9.7* 7.9* 7.1  --  10.5*  HGB 10.3* 11.8* 11.6* 10.6* 10.9* 10.9*  HCT 33.4* 37.8 36.6 33.7* 35.6* 34.6*  MCV 97.9 94.5 93.6 92.3 96.7 94.5  PLT 149* 166 168 147* 162 188   Cardiac Enzymes:  Recent Labs Lab 02/24/14 0740 02/24/14 0900 02/24/14 1429 02/24/14 2025 03/01/14 2350  TROPONINI <0.30 <0.30 <0.30 <0.30 <0.30    BNP (last 3 results)  Recent Labs  10/26/13 0510 11/07/13 0455 02/24/14 0900  PROBNP 9552.0* 22434.0* 32267.0*   CBG:  Recent Labs Lab 02/28/14 1213 02/28/14 1717 02/28/14 2121 03/01/14 0810 03/01/14 1745  GLUCAP 173* 125* 159* 128* 192*    Radiological Exams on Admission: Dg Chest 2 View  03/02/2014   CLINICAL DATA:  Weakness, shortness of breath  EXAM: CHEST  2 VIEW  COMPARISON:  Prior radiograph from 02/28/2014  FINDINGS: Left-sided hemodialysis catheter again noted. Cardiomegaly is stable. Mediastinal silhouette within normal limits.  Veiling opacity overlying the left hemidiaphragm is compatible with moderate left pleural effusion. A smaller right pleural effusion is likely present as well. There is mild central perihilar vascular congestion without overt pulmonary edema. No focal infiltrate identified. There is no pneumothorax.  Osseous structures are unchanged.  IMPRESSION: 1. Moderate bilateral pleural effusions, left greater than right. 2. Stable cardiomegaly without overt pulmonary edema.   Electronically Signed   By: Rise Mu M.D.   On: 03/02/2014 00:20   Dg Chest 2 View  02/28/2014   CLINICAL DATA:  Over clip chest pain,  shortness of breath, history hypertension, CHF, diabetes, COPD, former smoker, end-stage renal disease on dialysis  EXAM: CHEST  2 VIEW  COMPARISON:  02/25/2014  FINDINGS: RIGHT jugular dual-lumen  central venous catheter with distal tip projecting over the RIGHT atrium.  Enlargement of cardiac silhouette with pulmonary vascular congestion.  Atherosclerotic calcifications aorta.  Bibasilar effusions and atelectasis.  Peribronchial thickening with improved perihilar edema since previous exam.  No pneumothorax.  Bones demineralized.  IMPRESSION: Enlargement of cardiac silhouette with pulmonary vascular congestion.  Improved pulmonary edema with persistent bibasilar effusions and atelectasis.   Electronically Signed   By: Ulyses Southward M.D.   On: 02/28/2014 15:29    EKG: Independently reviewed. Normal sinus rhythm with LVH and repolarization changes comparable to old EKG.  Assessment/Plan Principal Problem:   Syncope Active Problems:   History of pulmonary embolus (PE)   CAD (coronary artery disease)   ESRD on hemodialysis   Systolic CHF   Weakness   1. Syncope - cause not clear. Observe in telemetry for any arrhythmias. Check orthostatics in a.m. Check cortisol levels and cycle cardiac markers. 2. ESRD on hemodialysis Monday Wednesday and Friday - left message for dialysis. 3. Diabetes mellitus - closely follow CBGs. On insulin. 4. Chronic anemia - probably from ESRD. Follow CBC. 5. COPD - presently not wheezing. 6. Hypothyroidism - continue Synthroid. 7. History of PE not on Coumadin secondary to GI bleed.    Code Status: Full code.  Family Communication: Unable to reach patient's family.  Disposition Plan: Admit for observation.    Eduard Clos [facility] Pager [phone].  If 7PM-7AM, please contact night-coverage [email or web address] Password [email or web address] 03/02/2014, 4:32 AM

## 2014-03-02 NOTE — ED Notes (Signed)
Report called to Darryl, RN 2W.

## 2014-03-02 NOTE — Progress Notes (Signed)
Thank you to Dr Aronson for this referral via the long length of stay meeting.  Chart review complete.  Patient is not eligible for [facility] Care Management services because his/her PCP is not a [facility] primary care provider or is not [facility] affiliated.  For any additional questions or new referrals please contact Tim Henderson BSN RN [facility] Liaison at [phone] °

## 2014-03-02 NOTE — Progress Notes (Signed)
Subjective: Interval History: has complaints relates this to pain pill given before d/c.  Objective: Vital signs in last 24 hours: Temp:  [97.3 F (36.3 C)-98.6 F (37 C)] 97.3 F (36.3 C) (04/30 0313) Pulse Rate:  [82-97] 92 (04/30 0313) Resp:  [12-26] 24 (04/30 0313) BP: (84-162)/(31-92) 96/48 mmHg (04/30 0313) SpO2:  [92 %-97 %] 92 % (04/30 0313) Weight:  [64.4 kg (141 lb 15.6 oz)-66.7 kg (147 lb 0.8 oz)] 64.8 kg (142 lb 13.7 oz) (04/30 0500) Weight change:   Intake/Output from previous day: 04/29 0701 - 04/30 0700 In: 120 [P.O.:120] Out: -  Intake/Output this shift:    General appearance: alert, cooperative, mildly obese, pale and bruises on neck and chest Resp: diminished breath sounds bilaterally and rales bibasilar Chest wall: IJ cath Cardio: S1, S2 normal and systolic murmur: holosystolic 2/6, blowing at apex GI: pos bs, liver down 5 cm Extremities: bruises  Lab Results:  Recent Labs  03/01/14 2350 03/02/14 0442  WBC 12.1* 10.7*  HGB 10.9* 10.5*  HCT 34.6* 33.8*  PLT 188 172   BMET:  Recent Labs  03/01/14 2350 03/02/14 0442  NA 133* 132*  K 4.1 3.9  CL 93* 95*  CO2 25 24  GLUCOSE 167* 147*  BUN 11 15  CREATININE 1.87* 2.18*  CALCIUM 7.9* 7.9*   No results found for this basename: PTH,  in the last 72 hours Iron Studies: No results found for this basename: IRON, TIBC, TRANSFERRIN, FERRITIN,  in the last 72 hours  Studies/Results: Dg Chest 2 View  03/02/2014   CLINICAL DATA:  Weakness, shortness of breath  EXAM: CHEST  2 VIEW  COMPARISON:  Prior radiograph from 02/28/2014  FINDINGS: Left-sided hemodialysis catheter again noted. Cardiomegaly is stable. Mediastinal silhouette within normal limits.  Veiling opacity overlying the left hemidiaphragm is compatible with moderate left pleural effusion. A smaller right pleural effusion is likely present as well. There is mild central perihilar vascular congestion without overt pulmonary edema. No focal  infiltrate identified. There is no pneumothorax.  Osseous structures are unchanged.  IMPRESSION: 1. Moderate bilateral pleural effusions, left greater than right. 2. Stable cardiomegaly without overt pulmonary edema.   Electronically Signed   By: Rise MuBenjamin  McClintock M.D.   On: 03/02/2014 00:20   Dg Chest 2 View  02/28/2014   CLINICAL DATA:  Over clip chest pain, shortness of breath, history hypertension, CHF, diabetes, COPD, former smoker, end-stage renal disease on dialysis  EXAM: CHEST  2 VIEW  COMPARISON:  02/25/2014  FINDINGS: RIGHT jugular dual-lumen central venous catheter with distal tip projecting over the RIGHT atrium.  Enlargement of cardiac silhouette with pulmonary vascular congestion.  Atherosclerotic calcifications aorta.  Bibasilar effusions and atelectasis.  Peribronchial thickening with improved perihilar edema since previous exam.  No pneumothorax.  Bones demineralized.  IMPRESSION: Enlargement of cardiac silhouette with pulmonary vascular congestion.  Improved pulmonary edema with persistent bibasilar effusions and atelectasis.   Electronically Signed   By: Ulyses SouthwardMark  Boles M.D.   On: 02/28/2014 15:29    I have reviewed the patient's current medications.  Assessment/Plan: 1 Syncope suspect carvedilol, narcotics, and bp post dialysis but worrisome is this is a second episode and bradycardia.  May need loop recorder. Will stop coreg 2 Anemia stable 3 HPTH 4 ESRD vol xs on  CXR but with this hx limit amt off 5 Severe COPD suspect some role P stop coreg, limit vol off tomorrow, monitor.      LOS: 1 day   Llana AlimentJames L Aylinn Rydberg  03/02/2014,10:23 AM

## 2014-03-03 ENCOUNTER — Other Ambulatory Visit: Payer: Self-pay | Admitting: *Deleted

## 2014-03-03 ENCOUNTER — Encounter (HOSPITAL_COMMUNITY)
Admission: EM | Disposition: A | Discharge status: Home / Self Care | Payer: Self-pay | Source: Home / Self Care | Attending: Internal Medicine

## 2014-03-03 DIAGNOSIS — R55 Syncope and collapse: Secondary | ICD-10-CM

## 2014-03-03 HISTORY — PX: LOOP RECORDER IMPLANT: SHX5477

## 2014-03-03 LAB — GLUCOSE, CAPILLARY
GLUCOSE-CAPILLARY: 134 mg/dL — AB (ref 70–99)
Glucose-Capillary: 162 mg/dL — ABNORMAL HIGH (ref 70–99)
Glucose-Capillary: 196 mg/dL — ABNORMAL HIGH (ref 70–99)
Glucose-Capillary: 231 mg/dL — ABNORMAL HIGH (ref 70–99)

## 2014-03-03 SURGERY — LOOP RECORDER IMPLANT
Anesthesia: LOCAL

## 2014-03-03 MED ORDER — NEPRO/CARBSTEADY PO LIQD
237.0000 mL | ORAL | Status: DC | PRN
  Filled 2014-03-03: qty 237

## 2014-03-03 MED ORDER — SODIUM CHLORIDE 0.9 % IV SOLN: 100.0000 mL | INTRAVENOUS | Status: DC | PRN

## 2014-03-03 MED ORDER — LIDOCAINE-PRILOCAINE 2.5-2.5 % EX CREA
1.0000 "application " | TOPICAL_CREAM | CUTANEOUS | Status: DC | PRN
  Filled 2014-03-03: qty 5

## 2014-03-03 MED ORDER — LIDOCAINE HCL (PF) 1 % IJ SOLN: 5.0000 mL | INTRAMUSCULAR | Status: DC | PRN

## 2014-03-03 MED ORDER — ALTEPLASE 2 MG IJ SOLR
2.0000 mg | Freq: Once | INTRAMUSCULAR | Status: DC | PRN
  Filled 2014-03-03: qty 2

## 2014-03-03 MED ORDER — PENTAFLUOROPROP-TETRAFLUOROETH EX AERO: 1.0000 "application " | INHALATION_SPRAY | CUTANEOUS | Status: DC | PRN

## 2014-03-03 MED ORDER — HEPARIN SODIUM (PORCINE) 1000 UNIT/ML DIALYSIS: 1000.0000 [IU] | INTRAMUSCULAR | Status: DC | PRN

## 2014-03-03 MED ORDER — HEPARIN SODIUM (PORCINE) 1000 UNIT/ML DIALYSIS
100.0000 [IU]/kg | INTRAMUSCULAR | Status: DC | PRN
  Administered 2014-03-03: 6500 [IU] via INTRAVENOUS_CENTRAL
  Filled 2014-03-03: qty 7

## 2014-03-03 MED ORDER — HEPARIN SODIUM (PORCINE) 1000 UNIT/ML DIALYSIS
100.0000 [IU]/kg | INTRAMUSCULAR | Status: DC | PRN
  Filled 2014-03-03: qty 7

## 2014-03-03 NOTE — Progress Notes (Signed)
Discussed with Dr. Johney Frame.   [facility] PROGRESS NOTE  [name] [name] WUJ:[id number] DOB: 09-23-1941 DOA: 03/01/2014 PCP: Quentin Mulling, MD  Assessment/Plan:  Principal Problem:   Syncope- recurrent: discussed with ep: 30 day event monitor. They will arrange Active Problems:   PVD- mild to moderate ICA disease, severe LE disease   History of PE- (although CTA negative for PE Jan 2015)   CAD- NSTEMI March 2015, severe 3V disease at cath March 2013 (failed RCA PCI then)   Diabetes mellitus- IDDM   ESRD on hemodialysis- (MWF in [place])   GI bleed- Mrach 2015- not Coumadin candidate   Hypotension   Cardiac arrest with hypotension/ transient bradycardia- brief CPR- 02/24/14   Cardiomyopathy, ischemic- EF 30-35%   Bradycardia  Increase activity Code Status:  full Family Communication:   Disposition Plan:  home  Consultants:  Cardiology  ep  Procedures:     Antibiotics:    HPI/Subjective: Feels better. hasnt walked much. preferrs to go tomorrow  Objective: Filed Vitals:   03/03/14 1247  BP: 148/69  Pulse: 75  Temp: 97.8 F (36.6 C)  Resp: 18    Intake/Output Summary (Last 24 hours) at 03/03/14 1617 Last data filed at 03/03/14 1340  Gross per 24 hour  Intake    420 ml  Output   2760 ml  Net  -2340 ml   Filed Weights   03/03/14 0612 03/03/14 0840 03/03/14 1247  Weight: 64.6 kg (142 lb 6.7 oz) 65.1 kg (143 lb 8.3 oz) 62.6 kg (138 lb 0.1 oz)    Exam:    Cardiovascular: RRR  Respiratory: CTA  Abdomen: S, nt  Ext: no CCE  Basic Metabolic Panel:  Recent Labs Lab 02/25/14 0329 02/26/14 0335 02/27/14 0407 02/28/14 0615 03/01/14 2350 03/02/14 0442  NA 138 135* 131* 135* 133* 132*  K 4.3 3.7 3.7 4.5 4.1 3.9  CL 98 95* 92* 97 93* 95*  CO2 26 25 26 28 25 24   GLUCOSE 161* 177* 105* 89 167* 147*  BUN 15 15 26* 14 11 15   CREATININE 2.04* 2.05* 3.01* 2.42* 1.87* 2.18*  CALCIUM 8.6 9.0 8.3* 8.4 7.9* 7.9*  MG 1.8 1.8 1.9  --   --   --    PHOS 2.9 1.5*  --   --   --   --    Liver Function Tests:  Recent Labs Lab 03/01/14 2350 03/02/14 0442  AST 38* 30  ALT 27 24  ALKPHOS 102 95  BILITOT 0.2* <0.2*  PROT 6.7 6.3  ALBUMIN 3.1* 2.9*   No results found for this basename: LIPASE, AMYLASE,  in the last 168 hours No results found for this basename: AMMONIA,  in the last 168 hours CBC:  Recent Labs Lab 02/25/14 0329 02/26/14 0335 02/27/14 0407 02/28/14 0615 03/01/14 2350 03/02/14 0442  WBC 11.3* 10.1 9.1 9.8 12.1* 10.7*  NEUTROABS 9.7* 7.9* 7.1  --  10.5* 8.7*  HGB 11.8* 11.6* 10.6* 10.9* 10.9* 10.5*  HCT 37.8 36.6 33.7* 35.6* 34.6* 33.8*  MCV 94.5 93.6 92.3 96.7 94.5 94.7  PLT 166 168 147* 162 188 172   Cardiac Enzymes:  Recent Labs Lab 02/24/14 2025 03/01/14 2350 03/02/14 0442 03/02/14 1032 03/02/14 1630  TROPONINI <0.30 <0.30 <0.30 <0.30 <0.30   BNP (last 3 results)  Recent Labs  10/26/13 0510 11/07/13 0455 02/24/14 0900  PROBNP 9552.0* 22434.0* 32267.0*   CBG:  Recent Labs Lab 03/02/14 1136 03/02/14 1623 03/02/14 2150 03/03/14 0615 03/03/14 1348  GLUCAP 183* 213*  143* 162* 134*    Recent Results (from the past 240 hour(s))  CULTURE, BLOOD (ROUTINE X 2)     Status: None   Collection Time    02/24/14  9:00 AM      Result Value Ref Range Status   Specimen Description BLOOD PICC LINE   Final   Special Requests BOTTLES DRAWN AEROBIC AND ANAEROBIC 10ML   Final   Culture  Setup Time     Final   Value: 02/24/2014 13:47     Performed at [facility]   Culture     Final   Value: STAPHYLOCOCCUS SPECIES (COAGULASE NEGATIVE)     Note: Gram Stain Report Called to,Read Back By and Verified With: SYLVIA PILAND 02/25/14 @ 10:17AM BY RUSCOE A.     Performed at [facility]   Report Status 03/01/2014 FINAL   Final   Organism ID, Bacteria STAPHYLOCOCCUS SPECIES (COAGULASE NEGATIVE)   Final  CULTURE, BLOOD (ROUTINE X 2)     Status: None   Collection Time    02/24/14  9:55 AM       Result Value Ref Range Status   Specimen Description BLOOD HAND LEFT   Final   Special Requests BOTTLES DRAWN AEROBIC ONLY 8ML   Final   Culture  Setup Time     Final   Value: 02/24/2014 14:12     Performed at [facility]   Culture     Final   Value: NO GROWTH 5 DAYS     Performed at [facility]   Report Status 03/02/2014 FINAL   Final  MRSA PCR SCREENING     Status: None   Collection Time    02/24/14 10:21 AM      Result Value Ref Range Status   MRSA by PCR NEGATIVE  NEGATIVE Final   Comment:            The GeneXpert MRSA Assay (FDA     approved for NASAL specimens     only), is one component of a     comprehensive MRSA colonization     surveillance program. It is not     intended to diagnose MRSA     infection nor to guide or     monitor treatment for     MRSA infections.  URINE CULTURE     Status: None   Collection Time    02/24/14  4:28 PM      Result Value Ref Range Status   Specimen Description URINE, RANDOM   Final   Special Requests NONE   Final   Culture  Setup Time     Final   Value: 02/24/2014 20:32     Performed at [facility]   Colony Count     Final   Value: 3,000 COLONIES/ML     Performed at [facility]   Culture     Final   Value: INSIGNIFICANT GROWTH     Performed at [facility]   Report Status 02/26/2014 FINAL   Final     Studies: Dg Chest 2 View  03/02/2014   CLINICAL DATA:  Weakness, shortness of breath  EXAM: CHEST  2 VIEW  COMPARISON:  Prior radiograph from 02/28/2014  FINDINGS: Left-sided hemodialysis catheter again noted. Cardiomegaly is stable. Mediastinal silhouette within normal limits.  Veiling opacity overlying the left hemidiaphragm is compatible with moderate left pleural effusion. A smaller right pleural effusion is likely present as well. There is mild central  perihilar vascular congestion without overt pulmonary edema. No focal infiltrate identified. There is no pneumothorax.  Osseous  structures are unchanged.  IMPRESSION: 1. Moderate bilateral pleural effusions, left greater than right. 2. Stable cardiomegaly without overt pulmonary edema.   Electronically Signed   By: Rise Mu M.D.   On: 03/02/2014 00:20    Scheduled Meds: . aspirin EC  81 mg Oral Daily  . calcium acetate  667 mg Oral TID WC  . enoxaparin (LOVENOX) injection  30 mg Subcutaneous Q24H  . insulin aspart  0-9 Units Subcutaneous TID WC  . insulin glargine  10 Units Subcutaneous QHS  . isosorbide mononitrate  15 mg Oral Daily  . levothyroxine  25 mcg Oral QAC breakfast  . midodrine  10 mg Oral 2 times per day on Mon Wed Fri  . multivitamin  1 tablet Oral QHS  . pantoprazole  40 mg Oral Daily  . simvastatin  80 mg Oral QPM  . sodium chloride  3 mL Intravenous Q12H  . sodium chloride  3 mL Intravenous Q12H  . tiotropium  18 mcg Inhalation Daily   Continuous Infusions:   Time spent: 15 minutes  Christiane Ha  [facility] Pager [phone]. If 7PM-7AM, please contact night-coverage at [email or web address], password [facility] 03/03/2014, 4:17 PM  LOS: 2 days

## 2014-03-03 NOTE — Procedures (Signed)
I was present at this session.  I have reviewed the session itself and made appropriate changes.  Hd via PC starting,  Follow bp closely as is volatile.  Nil Bolser L Taygen Newsome 5/1/20158:40 AM

## 2014-03-03 NOTE — Progress Notes (Signed)
Subjective: Interval History: has no complaint.  Objective: Vital signs in last 24 hours: Temp:  [97.7 F (36.5 C)-98.2 F (36.8 C)] 97.9 F (36.6 C) (05/01 0612) Pulse Rate:  [81-88] 81 (05/01 0612) Resp:  [18-19] 18 (05/01 0612) BP: (106-148)/(57-83) 148/83 mmHg (05/01 0612) SpO2:  [95 %-99 %] 95 % (05/01 0612) Weight:  [64.6 kg (142 lb 6.7 oz)] 64.6 kg (142 lb 6.7 oz) (05/01 0612) Weight change: -0.2 kg (-7.1 oz)  Intake/Output from previous day: 04/30 0701 - 05/01 0700 In: 240 [P.O.:240] Out: 160 [Urine:160] Intake/Output this shift:    General appearance: mildly obese and bruises Resp: diminished breath sounds bilaterally and rales bibasilar Chest wall: LIJ cath Cardio: S1, S2 normal and systolic murmur: holosystolic 2/6, blowing at apex GI: obese, pos bs , liver down 5 cm Extremities: trophic changes distally. old accesses UE  Lab Results:  Recent Labs  03/01/14 2350 03/02/14 0442  WBC 12.1* 10.7*  HGB 10.9* 10.5*  HCT 34.6* 33.8*  PLT 188 172   BMET:  Recent Labs  03/01/14 2350 03/02/14 0442  NA 133* 132*  K 4.1 3.9  CL 93* 95*  CO2 25 24  GLUCOSE 167* 147*  BUN 11 15  CREATININE 1.87* 2.18*  CALCIUM 7.9* 7.9*   No results found for this basename: PTH,  in the last 72 hours Iron Studies: No results found for this basename: IRON, TIBC, TRANSFERRIN, FERRITIN,  in the last 72 hours  Studies/Results: Dg Chest 2 View  03/02/2014   CLINICAL DATA:  Weakness, shortness of breath  EXAM: CHEST  2 VIEW  COMPARISON:  Prior radiograph from 02/28/2014  FINDINGS: Left-sided hemodialysis catheter again noted. Cardiomegaly is stable. Mediastinal silhouette within normal limits.  Veiling opacity overlying the left hemidiaphragm is compatible with moderate left pleural effusion. A smaller right pleural effusion is likely present as well. There is mild central perihilar vascular congestion without overt pulmonary edema. No focal infiltrate identified. There is no  pneumothorax.  Osseous structures are unchanged.  IMPRESSION: 1. Moderate bilateral pleural effusions, left greater than right. 2. Stable cardiomegaly without overt pulmonary edema.   Electronically Signed   By: Rise Mu M.D.   On: 03/02/2014 00:20    I have reviewed the patient's current medications.  Assessment/Plan: 1 ESRD for HD today. Try to slowly lower vol. Difficult with her 2 Anemia check Hb 3 HPTH meds 4 Copd with cor pulmonale limits vol off 5 DM controlled 6 Recurrent syncope with bradycardia  Off coreg, monitoring P HD, monitor ,cards input, check labs    LOS: 2 days   [name] [name] 03/03/2014,7:49 AM

## 2014-03-03 NOTE — Consult Note (Signed)
ELECTROPHYSIOLOGY CONSULT NOTE    Patient ID: [name] [name] MRN: [id number], DOB/AGE: 1941-09-05 73 y.o.  Admit date: 03/01/2014 Date of Consult: 03-03-14  Primary Physician: Quentin Mulling, MD Primary Cardiologist: Allyson Sabal  Reason for Consultation: bradycardia and syncope  HPI:  [name] [name] is a 73 y.o. female with a past medical history significant for CAD (chronic sub total occlusion of mid RCA with failed PCI attempt 2013), ESRD (on HD - left subclavian access with failed grafts in both upper extremities), hypothyroidism, COPD (on home O2), diabetes, orthostatic hypotension, and ICM.  She was admitted 01-2014 with NSTEMI and acute GI bleed with AVM's found.  Medical therapy was recommended at that time and she was initiated on low dose Coreg.  She was then readmitted 02-24-14 after having a period of unresponsiveness while at HD - she was given brief CPR at that time and was stable on EMS arrival.  By report, she then had recurrent bradycardia and unresponsiveness in the ER.  She was discharged to home on Coreg 3.125mg  twice daily and a reduced dose of Imdur.  The day after discharge she was at home and was transiently unresponsive.  EMS was called and per report upon EMS arrival, systolic blood pressure was 60 and she was bradycardic (no strips available).  Her blood pressure improved in the ER without intervention. She has been admitted and monitored on telemetry.  EP has been asked to evaluate for treatment options.   Echo 02-24-14 demonstrated EF 30-35%, severe hypokinesis of entire myocardium, akinesis of apical myocardium, trivial AR. Lab work this admission is notable for negative cardiac enzymes, hyponatremia, WBC 12.1, creat 2.18, calcium 7.9.   She denies chest pain, shortness of breath is stable, no further GI bleeding noted. ROS is otherwise negative.   Past Medical History  Diagnosis Date  . ESRD on hemodialysis     Started HD in December 2013.  Cause of ESRD unknown.  Gets HD in  [place] on MWF schedule. She had a right upper arm access which failed just short of one year.  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  She says she has "poor circulation" in the legs so they won't do a leg access. She remains catheter dependent as of   . Hypothyroidism   . Colitis 10/2011    C diff.   . Coronary artery disease 3/13    3V CAD at cath, failed RCA PCI  . Hypertension   . GERD (gastroesophageal reflux disease)   . Myocardial infarction   . Peripheral vascular disease     claudication.   Marland Kitchen Hx of pulmonary embolus   . COPD (chronic obstructive pulmonary disease)     Nocturnal O2.   . Shortness of breath     uses O2 as needed  . Critical lower limb ischemia   . DVT (deep venous thrombosis) 2008    "right"  . Pneumonia     "several times"  . Type II diabetes mellitus     insulin requiring.   . CHF (congestive heart failure)     Volume mgt with HD  . Hypercholesteremia   . GI bleed March 2015     Surgical History:  Past Surgical History  Procedure Laterality Date  . Tubal ligation    . Toe amputation Right     5th toe  . Tonsillectomy    . Insertion of dialysis  catheter Left     chest  . Av fistula placement Right [id number]    RUE  . Bascilic vein transposition  May 18, 2012    First stage  . Bascilic vein transposition Left 07/27/12    Basilic Vein Transposition  . Arch aortogram  08/19/2012    Procedure: ARCH AORTOGRAM;  Surgeon: Fransisco Hertz, MD;  Location: [facility] OR;  Service: Vascular;  Laterality: N/A;  . Insertion of dialysis catheter  08/21/2012    Procedure: INSERTION OF DIALYSIS CATHETER;  Surgeon: Sherren Kerns, MD;  Location: [facility] OR;  Service: Vascular;  Laterality: Left;  internal jugular  . Cardiac catheterization  02/04/2012    LAD 40/99%, CRX OK, OM1 90% (small), RCA 90%/subtotal, failed PCI. Good L-R collaterals.  . Esophagogastroduodenoscopy N/A 01/06/2014     Procedure: ESOPHAGOGASTRODUODENOSCOPY (EGD);  Surgeon: Hilarie Fredrickson, MD;  Location: [facility] ENDOSCOPY;  Service: Endoscopy;  Laterality: N/A;     Prescriptions prior to admission  Medication Sig Dispense Refill  . aspirin EC 81 MG tablet Take 81 mg by mouth daily.      . calcium acetate (PHOSLO) 667 MG capsule Take 667 mg by mouth 3 (three) times daily with meals.      . carvedilol (COREG) 3.125 MG tablet Take 1 tablet (3.125 mg total) by mouth 2 (two) times daily with a meal.  60 tablet  1  . insulin aspart (NOVOLOG) 100 UNIT/ML injection Inject 2 Units into the skin 3 (three) times daily with meals.  10 mL  1  . insulin glargine (LANTUS) 100 UNIT/ML injection Inject 0.1 mLs (10 Units total) into the skin at bedtime.  10 mL  1  . levothyroxine (SYNTHROID, LEVOTHROID) 25 MCG tablet Take 25 mcg by mouth daily.       . midodrine (PROAMATINE) 5 MG tablet Take 10 mg by mouth 2 (two) times daily with a meal. On dialysis days. Monday,wednesday and friday      . multivitamin (RENA-VIT) TABS tablet Take 1 tablet by mouth at bedtime.  30 tablet  0  . nitroGLYCERIN (NITROSTAT) 0.4 MG SL tablet Place 0.4 mg under the tongue every 5 (five) minutes as needed for chest pain. For chest pain.      . pantoprazole (PROTONIX) 40 MG tablet Take 40 mg by mouth daily.       . Probiotic Product (PROBIOTIC PO) Take 1 capsule by mouth daily as needed (digestion).       . simvastatin (ZOCOR) 80 MG tablet Take 1 tablet (80 mg total) by mouth every evening.  30 tablet  0  . tiotropium (SPIRIVA) 18 MCG inhalation capsule Place 1 capsule (18 mcg total) into inhaler and inhale daily.  30 capsule  12  . albuterol (PROVENTIL) (5 MG/ML) 0.5% nebulizer solution Take 0.5 mLs (2.5 mg total) by nebulization every 6 (six) hours as needed for wheezing or shortness of breath.  20 mL  12  . cyclobenzaprine (FLEXERIL) 10 MG tablet Take 1 tablet (10 mg total) by mouth 3 (three) times daily as needed for muscle spasms.  30 tablet  0  .  isosorbide mononitrate (IMDUR) 15 mg TB24 24 hr tablet Take 0.5 tablets (15 mg total) by mouth daily.  30 tablet  2    Inpatient Medications:  . aspirin EC  81 mg Oral Daily  . calcium acetate  667 mg Oral TID WC  . enoxaparin (LOVENOX) injection  30 mg Subcutaneous Q24H  . insulin aspart  0-9 Units Subcutaneous TID  WC  . insulin glargine  10 Units Subcutaneous QHS  . isosorbide mononitrate  15 mg Oral Daily  . levothyroxine  25 mcg Oral QAC breakfast  . midodrine  10 mg Oral 2 times per day on Mon Wed Fri  . multivitamin  1 tablet Oral QHS  . pantoprazole  40 mg Oral Daily  . simvastatin  80 mg Oral QPM  . sodium chloride  3 mL Intravenous Q12H  . sodium chloride  3 mL Intravenous Q12H  . tiotropium  18 mcg Inhalation Daily    Allergies:  Allergies  Allergen Reactions  . Procaine Hcl Nausea And Vomiting and Other (See Comments)    [facility] reaction of "comatose"    History   Social History  . Marital Status: Widowed    Spouse Name: N/A    Number of Children: N/A  . Years of Education: N/A   Occupational History  . Retired    Social History Main Topics  . Smoking status: Former Smoker -- 1.00 packs/day for 40 years    Types: Cigarettes    Quit date: 06/11/2011  . Smokeless tobacco: Never Used  . Alcohol Use: No  . Drug Use: No  . Sexual Activity: No   Other Topics Concern  . Not on file   Social History Narrative   [place]     Family History  Problem Relation Age of Onset  . Diabetes type II    . Hypertension    . Diabetes Mother   . Kidney disease Mother   . Diabetes Father   . Anesthesia problems Neg Hx     Physical Exam: Filed Vitals:   03/03/14 1200 03/03/14 1230 03/03/14 1247 03/03/14 1633  BP: 143/65 150/63 148/69 128/72  Pulse: 75 74 75 91  Temp:   97.8 F (36.6 C) 97.9 F (36.6 C)  TempSrc:   Oral Oral  Resp:   18 20  Height:      Weight:   138 lb 0.1 oz (62.6 kg)   SpO2: 98% 98% 98% 91%      GEN- The patient is chronically ill appearing, alert and oriented x 3 today.   Head- normocephalic, atraumatic Eyes-  Sclera clear, conjunctiva pink Ears- hearing intact Oropharynx- clear Neck- supple  Lungs- Clear to ausculation bilaterally, normal work of breathing Heart- Regular rate and rhythm  GI- soft, NT, ND, + BS Extremities- no clubbing, cyanosis, or edema MS- no significant deformity or atrophy Skin- diffuse echymosis Psych- euthymic mood, full affect Neuro- strength and sensation are intact  Labs:   Lab Results  Component Value Date   WBC 10.7* 03/02/2014   HGB 10.5* 03/02/2014   HCT 33.8* 03/02/2014   MCV 94.7 03/02/2014   PLT 172 03/02/2014    Recent Labs Lab 03/02/14 0442  NA 132*  K 3.9  CL 95*  CO2 24  BUN 15  CREATININE 2.18*  CALCIUM 7.9*  PROT 6.3  BILITOT <0.2*  ALKPHOS 95  ALT 24  AST 30  GLUCOSE 147*    Radiology/Studies: Dg Chest 2 View 03/02/2014   CLINICAL DATA:  Weakness, shortness of breath  EXAM: CHEST  2 VIEW  COMPARISON:  Prior radiograph from 02/28/2014  FINDINGS: Left-sided hemodialysis catheter again noted. Cardiomegaly is stable. Mediastinal silhouette within normal limits.  Veiling opacity overlying the left hemidiaphragm is compatible with moderate left pleural effusion. A smaller right pleural effusion is likely present as well. There is mild central perihilar vascular  congestion without overt pulmonary edema. No focal infiltrate identified. There is no pneumothorax.  Osseous structures are unchanged.  IMPRESSION: 1. Moderate bilateral pleural effusions, left greater than right. 2. Stable cardiomegaly without overt pulmonary edema.   Electronically Signed   By: Rise Mu M.D.   On: 03/02/2014 00:20   EKG: sinus 88 bpm, PR 142, QRS 108, [facility] Epic records are reviewed  TELEMETRY: sinus rhythm, no arrhythmias noted  Assessment and Plan:  1. Bradycardia I have reviewed telemetry and ekgs and do not see any documented  bradycardia.  It is not clear that her syncopal events are primiary bradycardic events.  I suspect that they may be primarily hypotensive events related to dialysis.  I would favor increasing her dry weight a little and following. We will stop coreg. 30 day event monitor at discharge She is not a candidate for invasive EP procedures at this time. Unless she has clear documentation of symptomatic bradycardia then we should avoid PPM in the future. No further EP workup planned.  No driving x 6 months Follow-up with Tereso Newcomer in 6 weeks  Electrophysiology team to see as needed while here. Please call with questions.

## 2014-03-04 DIAGNOSIS — R9431 Abnormal electrocardiogram [ECG] [EKG]: Secondary | ICD-10-CM

## 2014-03-04 DIAGNOSIS — I10 Essential (primary) hypertension: Secondary | ICD-10-CM

## 2014-03-04 LAB — GLUCOSE, CAPILLARY
GLUCOSE-CAPILLARY: 183 mg/dL — AB (ref 70–99)
Glucose-Capillary: 131 mg/dL — ABNORMAL HIGH (ref 70–99)

## 2014-03-04 MED ORDER — AMLODIPINE BESYLATE 5 MG PO TABS: 5.0000 mg | ORAL_TABLET | Freq: Every day | ORAL | Status: DC

## 2014-03-04 NOTE — Discharge Summary (Signed)
Physician Discharge Summary  [name] [name] ZOX:[id number] DOB: January 14, 1941 DOA: 03/01/2014  PCP: Quentin Mulling, MD  Admit date: 03/01/2014 Discharge date: 03/04/2014  Time spent: 35 minutes  Recommendations for Outpatient Follow-up:  1. Please follow up on Holter Monitor placement 2. Follow up on blood pressures, her coreg was discontinued on discharge due to concerns for bradycardia, she was discharged on Norvasc 5 mg PO q daily   Discharge Diagnoses:  Principal Problem:   Syncope- recurrent Active Problems:   PVD- mild to moderate ICA disease, severe LE disease   History of PE- (although CTA negative for PE Jan 2015)   CAD- NSTEMI March 2015, severe 3V disease at cath March 2013 (failed RCA PCI then)   Diabetes mellitus- IDDM   ESRD on hemodialysis- (MWF in [place])   GI bleed- Mrach 2015- not Coumadin candidate   Hypotension   Cardiac arrest with hypotension/ transient bradycardia- brief CPR- 02/24/14   Cardiomyopathy, ischemic- EF 30-35%   Bradycardia   Discharge Condition: Stable/Improved  Diet recommendation:Heart Healthy/Carb Consistent  Filed Weights   03/03/14 0840 03/03/14 1247 03/04/14 0506  Weight: 65.1 kg (143 lb 8.3 oz) 62.6 kg (138 lb 0.1 oz) 64.3 kg (141 lb 12.1 oz)    History of present illness:  [name] [name] is a 73 y.o. female with history of ESRD on hemodialysis who was discharged yesterday and became unresponsive transiently after reaching home as witnessed by patient's son. EMS was called and as per the ER physician EMS recorded a blood pressure of 60 systolic when patient was bradycardic. Patient spontaneously recovered consciousness and was brought to the ER. In the ER patient's blood pressure improved slowly without intervention. On exam patient is nonfocal and follows commands. Denies any chest pain or shortness of breath nausea vomiting abdominal pain or diarrhea. Patient was admitted last week with unresponsiveness and had CPR done. Patient was initially  treated for pulmonary med possible pneumonia. Today's chest x-ray does not show any infiltrates and EKG shows no new changes.   Hospital Course:  Patient is a pleasant 73 year old female with a past medical history of type 2 diabetes mellitus, end-stage renal disease, admitted to the medicine service on 03/02/2014. Patient had reported syncopal events after hemodialysis, in that witnessed by her son. EMS called, she was found to be hypotensive and bradycardic. On presentation to the emergency department patient's blood pressures improving. She was admitted to telemetry where troponins were cycled and remained negative x3 sets. Patient had been on Coreg therapy which was discontinued on admission to bradycardia. Cardiology and nephrology consulted during this hospitalization.  Telemetry strips and EKGs were reviewed. She did not have further episodes of bradycardia/syncope since admission. It was unclear if syncope was caused by bradycardia from cardiac issues or if this was related to hypotensive episode related to hemodialysis. Cardiology recommended a 30 day event monitor which was set up prior to discharge. She was up out of a candidate for invasive EP procedures, with no further workup recommended. Prior to discharge it was noted that patient's blood pressures were elevated with systolic blood pressures in the 150s to 170s. Since Coreg was discontinued she was discharged on Norvasc 5 mg by mouth daily. Patient discharged in stable condition to her home on 03/04/2014.   Consultations:  Nephrology  Cardiology  Discharge Exam: Filed Vitals:   03/04/14 0700  BP: 154/68  Pulse:   Temp:   Resp:     General: Patient is sitting up, reports feeling better, anxious to go  home today Cardiovascular: Regular rate rhythm normal S1-S2 Respiratory: Clear to auscultation bilaterally Extremities: No edema  Discharge Instructions You were cared for by a hospitalist during your hospital stay. If you have  any questions about your discharge medications or the care you received while you were in the hospital after you are discharged, you can call the unit and asked to speak with the hospitalist on call if the hospitalist that took care of you is not available. Once you are discharged, your primary care physician will handle any further medical issues. Please note that NO REFILLS for any discharge medications will be authorized once you are discharged, as it is imperative that you return to your primary care physician (or establish a relationship with a primary care physician if you do not have one) for your aftercare needs so that they can reassess your need for medications and monitor your lab values.  Discharge Orders   Future Appointments Provider Department Dept Phone   04/17/2014 8:50 AM Beatrice Lecher, PA-C [facility] [place] Office [phone]   Future Orders Complete By Expires   Call MD for:  difficulty breathing, headache or visual disturbances  As directed    Call MD for:  extreme fatigue  As directed    Call MD for:  persistant dizziness or light-headedness  As directed    Call MD for:  persistant nausea and vomiting  As directed    Call MD for:  severe uncontrolled pain  As directed    Call MD for:  temperature >100.4  As directed    Diet - low sodium heart healthy  As directed    Increase activity slowly  As directed        Medication List    STOP taking these medications       carvedilol 3.125 MG tablet  Commonly known as:  COREG      TAKE these medications       albuterol (5 MG/ML) 0.5% nebulizer solution  Commonly known as:  PROVENTIL  Take 0.5 mLs (2.5 mg total) by nebulization every 6 (six) hours as needed for wheezing or shortness of breath.     amLODipine 5 MG tablet  Commonly known as:  NORVASC  Take 1 tablet (5 mg total) by mouth daily.     aspirin EC 81 MG tablet  Take 81 mg by mouth daily.     calcium acetate 667 MG capsule  Commonly known as:   PHOSLO  Take 667 mg by mouth 3 (three) times daily with meals.     cyclobenzaprine 10 MG tablet  Commonly known as:  FLEXERIL  Take 1 tablet (10 mg total) by mouth 3 (three) times daily as needed for muscle spasms.     insulin aspart 100 UNIT/ML injection  Commonly known as:  novoLOG  Inject 2 Units into the skin 3 (three) times daily with meals.     insulin glargine 100 UNIT/ML injection  Commonly known as:  LANTUS  Inject 0.1 mLs (10 Units total) into the skin at bedtime.     isosorbide mononitrate 15 mg Tb24 24 hr tablet  Commonly known as:  IMDUR  Take 0.5 tablets (15 mg total) by mouth daily.     levothyroxine 25 MCG tablet  Commonly known as:  SYNTHROID, LEVOTHROID  Take 25 mcg by mouth daily.     midodrine 5 MG tablet  Commonly known as:  PROAMATINE  Take 10 mg by mouth 2 (two) times daily with a meal.  On dialysis days. Monday,wednesday and friday     multivitamin Tabs tablet  Take 1 tablet by mouth at bedtime.     nitroGLYCERIN 0.4 MG SL tablet  Commonly known as:  NITROSTAT  Place 0.4 mg under the tongue every 5 (five) minutes as needed for chest pain. For chest pain.     pantoprazole 40 MG tablet  Commonly known as:  PROTONIX  Take 40 mg by mouth daily.     PROBIOTIC PO  Take 1 capsule by mouth daily as needed (digestion).     simvastatin 80 MG tablet  Commonly known as:  ZOCOR  Take 1 tablet (80 mg total) by mouth every evening.     tiotropium 18 MCG inhalation capsule  Commonly known as:  SPIRIVA  Place 1 capsule (18 mcg total) into inhaler and inhale daily.       Allergies  Allergen Reactions  . Procaine Hcl Nausea And Vomiting and Other (See Comments)    [facility] has reaction of "comatose"       Follow-up Information   Follow up with HOOPER,[name], MD In 1 week.   Specialty:  Geriatric Medicine   Contact information:   [address] [place] [address] [phone]       Follow up with Tereso Newcomer, PA-C In 4  weeks.   Specialty:  Physician Assistant   Contact information:   [address] [place] [address] [phone]        The results of significant diagnostics from this hospitalization (including imaging, microbiology, ancillary and laboratory) are listed below for reference.    Significant Diagnostic Studies: Dg Chest 2 View  03/02/2014   CLINICAL DATA:  Weakness, shortness of breath  EXAM: CHEST  2 VIEW  COMPARISON:  Prior radiograph from 02/28/2014  FINDINGS: Left-sided hemodialysis catheter again noted. Cardiomegaly is stable. Mediastinal silhouette within normal limits.  Veiling opacity overlying the left hemidiaphragm is compatible with moderate left pleural effusion. A smaller right pleural effusion is likely present as well. There is mild central perihilar vascular congestion without overt pulmonary edema. No focal infiltrate identified. There is no pneumothorax.  Osseous structures are unchanged.  IMPRESSION: 1. Moderate bilateral pleural effusions, left greater than right. 2. Stable cardiomegaly without overt pulmonary edema.   Electronically Signed   By: Rise Mu M.D.   On: 03/02/2014 00:20   Dg Chest 2 View  02/28/2014   CLINICAL DATA:  Over clip chest pain, shortness of breath, history hypertension, CHF, diabetes, COPD, former smoker, end-stage renal disease on dialysis  EXAM: CHEST  2 VIEW  COMPARISON:  02/25/2014  FINDINGS: RIGHT jugular dual-lumen central venous catheter with distal tip projecting over the RIGHT atrium.  Enlargement of cardiac silhouette with pulmonary vascular congestion.  Atherosclerotic calcifications aorta.  Bibasilar effusions and atelectasis.  Peribronchial thickening with improved perihilar edema since previous exam.  No pneumothorax.  Bones demineralized.  IMPRESSION: Enlargement of cardiac silhouette with pulmonary vascular congestion.  Improved pulmonary edema with persistent bibasilar effusions and atelectasis.    Electronically Signed   By: Ulyses Southward M.D.   On: 02/28/2014 15:29   Dg Chest Port 1 View  02/25/2014   CLINICAL DATA:  Respiratory failure  EXAM: PORTABLE CHEST - 1 VIEW  COMPARISON:  Prior chest x-ray 02/24/2014  FINDINGS: Stable cardiomegaly. Left IJ approach split tip hemodialysis catheter with catheter tips at the superior cavoatrial junction and upper right atrium. Atherosclerotic calcifications in the transverse aorta. Increased pulmonary edema. Enlarging bilateral  layering pleural effusions and associated bibasilar atelectasis. No acute osseous abnormality. No pneumothorax.  IMPRESSION: Developing pulmonary edema and enlarging bilateral layering effusions. Differential considerations include CHF and volume overload.   Electronically Signed   By: Malachy Moan M.D.   On: 02/25/2014 09:16   Dg Chest Port 1 View  02/24/2014   CLINICAL DATA:  post-arrest  EXAM: PORTABLE CHEST - 1 VIEW  COMPARISON:  DG CHEST 1V PORT dated 01/04/2014  FINDINGS: The cardiac silhouette is enlarged. There is diffuse prominence of interstitial markings, peribronchial cuffing, indistinctness of pulmonary vasculature. Areas of increased density in the lung bases. Small bilateral effusions left greater than right. No acute osseous abnormalities.  IMPRESSION: Pulmonary edema and small bilateral effusions. Areas of increased density within the lung bases consistent with areas of asymmetric edema versus atelectasis.   Electronically Signed   By: Salome Holmes M.D.   On: 02/24/2014 08:12   Dg Abd Portable 1v  02/24/2014   CLINICAL DATA:  confirm R fem line  EXAM: PORTABLE ABDOMEN - 1 VIEW  COMPARISON:  DG LUMBAR SPINE COMPLETE 4+V dated 01/16/2014; CT ABD/PELV WO CM dated 03/09/2013  FINDINGS: The bowel gas pattern is normal. Coarse vascular calcifications appreciated. Tip of a right femoral central venous catheter is appreciated projecting in the region of the proximal femoral vein on the right. No acute osseous abnormalities.   IMPRESSION: Nonobstructive bowel gas pattern. Right femoral vein central venous catheter appreciated.   Electronically Signed   By: Salome Holmes M.D.   On: 02/24/2014 08:11   Dg Foot Complete Left  02/24/2014   CLINICAL DATA:  Left heel wound.  EXAM: LEFT FOOT - COMPLETE 3+ VIEW  COMPARISON:  Os calcis 04/19/2013.  FINDINGS: Skin wound is seen along the lateral margin of the calcaneus. No bony destructive change is seen although the calcaneus is not well visualized on this exam. Pes cavus deformity is noted.  IMPRESSION: Soft tissue wound lateral calcaneus. No plain film evidence of osteomyelitis although the calcaneus is poorly visualized.  Pes cavus.   Electronically Signed   By: Drusilla Kanner M.D.   On: 02/24/2014 20:42    Microbiology: Recent Results (from the past 240 hour(s))  CULTURE, BLOOD (ROUTINE X 2)     Status: None   Collection Time    02/24/14  9:00 AM      Result Value Ref Range Status   Specimen Description BLOOD PICC LINE   Final   Special Requests BOTTLES DRAWN AEROBIC AND ANAEROBIC   Final   Culture  Setup Time     Final   Value: 02/24/2014 13:47     Performed at [facility]   Culture     Final   Value: STAPHYLOCOCCUS SPECIES (COAGULASE NEGATIVE)     Note: Gram Stain Report Called to,Read Back By and Verified With: SYLVIA PILAND 02/25/14 @ 10:17AM BY RUSCOE A.     Performed at [facility]   Report Status 03/01/2014 FINAL   Final   Organism ID, Bacteria STAPHYLOCOCCUS SPECIES (COAGULASE NEGATIVE)   Final  CULTURE, BLOOD (ROUTINE X 2)     Status: None   Collection Time    02/24/14  9:55 AM      Result Value Ref Range Status   Specimen Description BLOOD HAND LEFT   Final   Special Requests BOTTLES DRAWN AEROBIC ONLY   Final   Culture  Setup Time     Final   Value: 02/24/2014 14:12  Performed at [facility]     Final   Value: NO GROWTH 5 DAYS     Performed at [facility]   Report Status 03/02/2014 FINAL    Final  MRSA PCR SCREENING     Status: None   Collection Time    02/24/14 10:21 AM      Result Value Ref Range Status   MRSA by PCR NEGATIVE  NEGATIVE Final   Comment:            The GeneXpert MRSA Assay (FDA     approved for NASAL specimens     only), is one component of a     comprehensive MRSA colonization     surveillance program. It is not     intended to diagnose MRSA     infection nor to guide or     monitor treatment for     MRSA infections.  URINE CULTURE     Status: None   Collection Time    02/24/14  4:28 PM      Result Value Ref Range Status   Specimen Description URINE, RANDOM   Final   Special Requests NONE   Final   Culture  Setup Time     Final   Value: 02/24/2014 20:32     Performed at [facility] Count     Final   Value: 3,000 COLONIES/ML     Performed at [facility]   Culture     Final   Value: INSIGNIFICANT GROWTH     Performed at [facility]   Report Status 02/26/2014 FINAL   Final     Labs: Basic Metabolic Panel:  Recent Labs Lab 02/26/14 0335 02/27/14 0407 02/28/14 0615 03/01/14 2350 03/02/14 0442  NA 135* 131* 135* 133* 132*  K 3.7 3.7 4.5 4.1 3.9  CL 95* 92* 97 93* 95*  CO2 25 26 28 25 24   GLUCOSE 177* 105* 89 167* 147*  BUN 15 26* 14 11 15   CREATININE 2.05* 3.01* 2.42* 1.87* 2.18*  CALCIUM 9.0 8.3* 8.4 7.9* 7.9*  MG 1.8 1.9  --   --   --   PHOS 1.5*  --   --   --   --    Liver Function Tests:  Recent Labs Lab 03/01/14 2350 03/02/14 0442  AST 38* 30  ALT 27 24  ALKPHOS 102 95  BILITOT 0.2* <0.2*  PROT 6.7 6.3  ALBUMIN 3.1* 2.9*   No results found for this basename: LIPASE, AMYLASE,  in the last 168 hours No results found for this basename: AMMONIA,  in the last 168 hours CBC:  Recent Labs Lab 02/26/14 0335 02/27/14 0407 02/28/14 0615 03/01/14 2350 03/02/14 0442  WBC 10.1 9.1 9.8 12.1* 10.7*  NEUTROABS 7.9* 7.1  --  10.5* 8.7*  HGB 11.6* 10.6* 10.9* 10.9* 10.5*  HCT 36.6 33.7*  35.6* 34.6* 33.8*  MCV 93.6 92.3 96.7 94.5 94.7  PLT 168 147* 162 188 172   Cardiac Enzymes:  Recent Labs Lab 03/01/14 2350 03/02/14 0442 03/02/14 1032 03/02/14 1630  TROPONINI <0.30 <0.30 <0.30 <0.30   BNP: BNP (last 3 results)  Recent Labs  10/26/13 0510 11/07/13 0455 02/24/14 0900  PROBNP 9552.0* 22434.0* 32267.0*   CBG:  Recent Labs Lab 03/03/14 1348 03/03/14 1628 03/03/14 2032 03/04/14 0627 03/04/14 1149  GLUCAP 134* 196* 231* 131* 183*       Signed:  Jeralyn Bennett  [facility]  03/04/2014, 12:46 PM

## 2014-03-04 NOTE — Progress Notes (Signed)
Subjective: Interval History: has no complaint..  Objective: Vital signs in last 24 hours: Temp:  [97.6 F (36.4 C)-98.6 F (37 C)] 97.7 F (36.5 C) (05/02 0506) Pulse Rate:  [71-91] 75 (05/02 0506) Resp:  [18-20] 18 (05/02 0506) BP: (128-178)/(63-88) 154/68 mmHg (05/02 0700) SpO2:  [91 %-98 %] 97 % (05/02 0506) Weight:  [62.6 kg (138 lb 0.1 oz)-65.1 kg (143 lb 8.3 oz)] 64.3 kg (141 lb 12.1 oz) (05/02 0506) Weight change: 0.5 kg (1 lb 1.6 oz)  Intake/Output from previous day: 05/01 0701 - 05/02 0700 In: 620 [P.O.:620] Out: 2600 [Urine:100] Intake/Output this shift:    General appearance: alert, cooperative, pale and bruises Resp: diminished breath sounds bilaterally and rales bibasilar Chest wall: IJ cath Cardio: S1, S2 normal and systolic murmur: holosystolic 2/6, blowing at apex GI: abnormal findings:  pos bs, soft Extremities: extremities normal, atraumatic, no cyanosis or edema  Lab Results:  Recent Labs  03/01/14 2350 03/02/14 0442  WBC 12.1* 10.7*  HGB 10.9* 10.5*  HCT 34.6* 33.8*  PLT 188 172   BMET:  Recent Labs  03/01/14 2350 03/02/14 0442  NA 133* 132*  K 4.1 3.9  CL 93* 95*  CO2 25 24  GLUCOSE 167* 147*  BUN 11 15  CREATININE 1.87* 2.18*  CALCIUM 7.9* 7.9*   No results found for this basename: PTH,  in the last 72 hours Iron Studies: No results found for this basename: IRON, TIBC, TRANSFERRIN, FERRITIN,  in the last 72 hours  Studies/Results: No results found.  I have reviewed the patient's current medications.  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  Vol xs yet unable to remove with HD 3 COPD severe with Cor Pulmonale 4 DM controlled 5 CAD suspect role in this process 6 PVD 7 ^ Lipids P counsel, event monitor , keep off beta blockers, HD MWF    LOS: 3 days   [name] [name] L [name] [name] 03/04/2014,8:21 AM

## 2014-03-11 ENCOUNTER — Emergency Department (HOSPITAL_COMMUNITY)
Admission: EM | Admit: 2014-03-11 | Discharge: 2014-03-12 | Disposition: A | Discharge status: Home / Self Care | Payer: Medicare Other | Attending: Emergency Medicine | Admitting: Emergency Medicine

## 2014-03-11 ENCOUNTER — Encounter (HOSPITAL_COMMUNITY): Payer: Self-pay | Admitting: Emergency Medicine

## 2014-03-11 ENCOUNTER — Emergency Department (HOSPITAL_COMMUNITY): Payer: Medicare Other

## 2014-03-11 DIAGNOSIS — E039 Hypothyroidism, unspecified: Secondary | ICD-10-CM | POA: Insufficient documentation

## 2014-03-11 DIAGNOSIS — K219 Gastro-esophageal reflux disease without esophagitis: Secondary | ICD-10-CM | POA: Insufficient documentation

## 2014-03-11 DIAGNOSIS — J4489 Other specified chronic obstructive pulmonary disease: Secondary | ICD-10-CM | POA: Insufficient documentation

## 2014-03-11 DIAGNOSIS — J90 Pleural effusion, not elsewhere classified: Secondary | ICD-10-CM | POA: Insufficient documentation

## 2014-03-11 DIAGNOSIS — R0902 Hypoxemia: Secondary | ICD-10-CM | POA: Insufficient documentation

## 2014-03-11 DIAGNOSIS — I252 Old myocardial infarction: Secondary | ICD-10-CM | POA: Insufficient documentation

## 2014-03-11 DIAGNOSIS — Z7982 Long term (current) use of aspirin: Secondary | ICD-10-CM | POA: Insufficient documentation

## 2014-03-11 DIAGNOSIS — I120 Hypertensive chronic kidney disease with stage 5 chronic kidney disease or end stage renal disease: Secondary | ICD-10-CM | POA: Insufficient documentation

## 2014-03-11 DIAGNOSIS — Z79899 Other long term (current) drug therapy: Secondary | ICD-10-CM | POA: Insufficient documentation

## 2014-03-11 DIAGNOSIS — Z992 Dependence on renal dialysis: Secondary | ICD-10-CM | POA: Insufficient documentation

## 2014-03-11 DIAGNOSIS — N19 Unspecified kidney failure: Secondary | ICD-10-CM

## 2014-03-11 DIAGNOSIS — E119 Type 2 diabetes mellitus without complications: Secondary | ICD-10-CM | POA: Insufficient documentation

## 2014-03-11 DIAGNOSIS — Z9889 Other specified postprocedural states: Secondary | ICD-10-CM | POA: Insufficient documentation

## 2014-03-11 DIAGNOSIS — Z87891 Personal history of nicotine dependence: Secondary | ICD-10-CM | POA: Insufficient documentation

## 2014-03-11 DIAGNOSIS — Z794 Long term (current) use of insulin: Secondary | ICD-10-CM | POA: Insufficient documentation

## 2014-03-11 DIAGNOSIS — Z8701 Personal history of pneumonia (recurrent): Secondary | ICD-10-CM | POA: Insufficient documentation

## 2014-03-11 DIAGNOSIS — N186 End stage renal disease: Secondary | ICD-10-CM | POA: Insufficient documentation

## 2014-03-11 DIAGNOSIS — E7800 Pure hypercholesterolemia, unspecified: Secondary | ICD-10-CM | POA: Insufficient documentation

## 2014-03-11 DIAGNOSIS — I509 Heart failure, unspecified: Secondary | ICD-10-CM | POA: Insufficient documentation

## 2014-03-11 DIAGNOSIS — Z86711 Personal history of pulmonary embolism: Secondary | ICD-10-CM | POA: Insufficient documentation

## 2014-03-11 DIAGNOSIS — Z9981 Dependence on supplemental oxygen: Secondary | ICD-10-CM | POA: Insufficient documentation

## 2014-03-11 DIAGNOSIS — J449 Chronic obstructive pulmonary disease, unspecified: Secondary | ICD-10-CM | POA: Insufficient documentation

## 2014-03-11 DIAGNOSIS — I10 Essential (primary) hypertension: Secondary | ICD-10-CM | POA: Insufficient documentation

## 2014-03-11 DIAGNOSIS — I2510 Atherosclerotic heart disease of native coronary artery without angina pectoris: Secondary | ICD-10-CM | POA: Insufficient documentation

## 2014-03-11 DIAGNOSIS — Z8719 Personal history of other diseases of the digestive system: Secondary | ICD-10-CM | POA: Insufficient documentation

## 2014-03-11 DIAGNOSIS — Z86718 Personal history of other venous thrombosis and embolism: Secondary | ICD-10-CM | POA: Insufficient documentation

## 2014-03-11 LAB — I-STAT CHEM 8, ED
BUN: 26 mg/dL — AB (ref 6–23)
CREATININE: 2.7 mg/dL — AB (ref 0.50–1.10)
Calcium, Ion: 1.21 mmol/L (ref 1.13–1.30)
Chloride: 94 mEq/L — ABNORMAL LOW (ref 96–112)
Glucose, Bld: 149 mg/dL — ABNORMAL HIGH (ref 70–99)
HCT: 40 % (ref 36.0–46.0)
Hemoglobin: 13.6 g/dL (ref 12.0–15.0)
POTASSIUM: 3.7 meq/L (ref 3.7–5.3)
Sodium: 135 mEq/L — ABNORMAL LOW (ref 137–147)
TCO2: 26 mmol/L (ref 0–100)

## 2014-03-11 LAB — COMPREHENSIVE METABOLIC PANEL
ALBUMIN: 3.4 g/dL — AB (ref 3.5–5.2)
ALT: 28 U/L (ref 0–35)
AST: 29 U/L (ref 0–37)
Alkaline Phosphatase: 164 U/L — ABNORMAL HIGH (ref 39–117)
BUN: 26 mg/dL — ABNORMAL HIGH (ref 6–23)
CHLORIDE: 93 meq/L — AB (ref 96–112)
CO2: 27 mEq/L (ref 19–32)
CREATININE: 2.57 mg/dL — AB (ref 0.50–1.10)
Calcium: 9.2 mg/dL (ref 8.4–10.5)
GFR calc Af Amer: 21 mL/min — ABNORMAL LOW (ref >90)
GFR calc non Af Amer: 18 mL/min — ABNORMAL LOW (ref >90)
Glucose, Bld: 147 mg/dL — ABNORMAL HIGH (ref 70–99)
Potassium: 3.8 mEq/L (ref 3.7–5.3)
Sodium: 133 mEq/L — ABNORMAL LOW (ref 137–147)
Total Bilirubin: 0.3 mg/dL (ref 0.3–1.2)
Total Protein: 6.9 g/dL (ref 6.0–8.3)

## 2014-03-11 LAB — I-STAT ARTERIAL BLOOD GAS, ED
Acid-Base Excess: 3 mmol/L — ABNORMAL HIGH (ref 0.0–2.0)
Bicarbonate: 29.4 mEq/L — ABNORMAL HIGH (ref 20.0–24.0)
O2 SAT: 76 %
PCO2 ART: 52.4 mmHg — AB (ref 35.0–45.0)
PH ART: 7.358 (ref 7.350–7.450)
TCO2: 31 mmol/L (ref 0–100)
pO2, Arterial: 43 mmHg — ABNORMAL LOW (ref 80.0–100.0)

## 2014-03-11 LAB — I-STAT CG4 LACTIC ACID, ED: LACTIC ACID, VENOUS: 0.49 mmol/L — AB (ref 0.5–2.2)

## 2014-03-11 LAB — CBC WITH DIFFERENTIAL/PLATELET
Basophils Absolute: 0.1 10*3/uL (ref 0.0–0.1)
Basophils Relative: 1 % (ref 0–1)
Eosinophils Absolute: 0.3 10*3/uL (ref 0.0–0.7)
Eosinophils Relative: 3 % (ref 0–5)
HCT: 37.8 % (ref 36.0–46.0)
Hemoglobin: 11.6 g/dL — ABNORMAL LOW (ref 12.0–15.0)
LYMPHS ABS: 1.1 10*3/uL (ref 0.7–4.0)
LYMPHS PCT: 11 % — AB (ref 12–46)
MCH: 29.2 pg (ref 26.0–34.0)
MCHC: 30.7 g/dL (ref 30.0–36.0)
MCV: 95.2 fL (ref 78.0–100.0)
Monocytes Absolute: 0.7 10*3/uL (ref 0.1–1.0)
Monocytes Relative: 6 % (ref 3–12)
NEUTROS ABS: 8.5 10*3/uL — AB (ref 1.7–7.7)
NEUTROS PCT: 79 % — AB (ref 43–77)
PLATELETS: 250 10*3/uL (ref 150–400)
RBC: 3.97 MIL/uL (ref 3.87–5.11)
RDW: 16.9 % — ABNORMAL HIGH (ref 11.5–15.5)
WBC: 10.7 10*3/uL — ABNORMAL HIGH (ref 4.0–10.5)

## 2014-03-11 LAB — I-STAT TROPONIN, ED: Troponin i, poc: 0.02 ng/mL (ref 0.00–0.08)

## 2014-03-11 NOTE — ED Notes (Signed)
sats better.  Pt remains  alert

## 2014-03-11 NOTE — ED Notes (Signed)
The pt arrived by [place] ems from the pts home.  She woke up this am with low sats..  She has no difficulty breathing.  Her fingers are ice cold and she reports that they are always ,like that.  Color good no cyanosis..  Dialyzed Friday in [place].  Old fistula rt arm.  Dialysis cath in her chest.

## 2014-03-11 NOTE — ED Notes (Signed)
Dr Hyacinth Meeker given a copy of lactic acid results .49

## 2014-03-11 NOTE — ED Provider Notes (Signed)
CSN: [id number]     Arrival date & time 03/11/14  2154 History   First MD Initiated Contact with Patient 03/11/14 2205     Chief Complaint  Patient presents with  . low 02 sats      (Consider location/radiation/quality/duration/timing/severity/associated sxs/prior Treatment) HPI Comments: Pt is a 73 y/o female with hx of recent need for CPR (during dialysis), who dialyzes Monday Wednesday and Friday, dialyzing last yesterday. She presents with a complaint of low oxygen saturations. She wears between 3 and 4 L of nasal cannula oxygen 24 hours a day ever since she started dialysis 2 and half years ago. She noticed that her oxygen was around 86% today which is lower than usual. On most days is round 90 or 91%. She denies feeling short of breath, denies cough, denies fever, denies swelling. She has no chest pain, back pain, abdominal pain, fevers chills nausea or vomiting.  The history is provided by the patient and medical records.    Past Medical History  Diagnosis Date  . ESRD on hemodialysis     Started HD in December 2013.  Cause of ESRD unknown.  Gets HD in [place] on MWF schedule. She had a right upper arm access which failed just short of one year.  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  She says she has "poor circulation" in the legs so they won't do a leg access. She remains catheter dependent as of   . Hypothyroidism   . Colitis 10/2011    C diff.   . Coronary artery disease 3/13    3V CAD at cath, failed RCA PCI  . Hypertension   . GERD (gastroesophageal reflux disease)   . Myocardial infarction   . Peripheral vascular disease     claudication.   Marland Kitchen Hx of pulmonary embolus   . COPD (chronic obstructive pulmonary disease)     Nocturnal O2.   . Shortness of breath     uses O2 as needed  . Critical lower limb ischemia   . DVT (deep venous thrombosis) 2008    "right"  . Pneumonia     "several  times"  . Type II diabetes mellitus     insulin requiring.   . CHF (congestive heart failure)     Volume mgt with HD  . Hypercholesteremia   . GI bleed March 2015   Past Surgical History  Procedure Laterality Date  . Tubal ligation    . Toe amputation Right     5th toe  . Tonsillectomy    . Insertion of dialysis catheter Left     chest  . Av fistula placement Right [id number]    RUE  . Bascilic vein transposition  May 18, 2012    First stage  . Bascilic vein transposition Left 07/27/12    Basilic Vein Transposition  . Arch aortogram  08/19/2012    Procedure: ARCH AORTOGRAM;  Surgeon: Fransisco Hertz, MD;  Location[facility] OR;  Service: Vascular;  Laterality: N/A;  . Insertion of dialysis catheter  08/21/2012    Procedure: INSERTION OF DIALYSIS CATHETER;  Surgeon: Sherren Kerns, MD;  Location: [facility] OR;  Service: Vascular;  Laterality: Left;  internal jugular  . Cardiac catheterization  02/04/2012    LAD 40/99%, CRX OK, OM1 90% (small), RCA 90%/subtotal, failed PCI. Good L-R collaterals.  . Esophagogastroduodenoscopy N/A 01/06/2014    Procedure: ESOPHAGOGASTRODUODENOSCOPY (EGD);  Surgeon: Hilarie FredricksonJohn N Perry, MD;  Location[facility] ENDOSCOPY;  Service: Endoscopy;  Laterality: N/A;   Family History  Problem Relation Age of Onset  . Diabetes type II    . Hypertension    . Diabetes Mother   . Kidney disease Mother   . Diabetes Father   . Anesthesia problems Neg Hx    History  Substance Use Topics  . Smoking status: Former Smoker -- 1.00 packs/day for 40 years    Types: Cigarettes    Quit date: 06/11/2011  . Smokeless tobacco: Never Used  . Alcohol Use: No   OB History   Grav Para Term Preterm Abortions TAB SAB Ect Mult Living                 Review of Systems  All other systems reviewed and are negative.     Allergies  Procaine hcl  Home Medications   Prior to Admission medications   Medication Sig Start Date End Date Taking? Authorizing Provider  albuterol (PROVENTIL) (5 MG/ML)  0.5% nebulizer solution Take 0.5 mLs (2.5 mg total) by nebulization every 6 (six) hours as needed for wheezing or shortness of breath. 03/01/14   Esperanza SheetsUlugbek N Buriev, MD  amLODipine (NORVASC) 5 MG tablet Take 1 tablet (5 mg total) by mouth daily. 03/04/14   Jeralyn BennettEzequiel Zamora, MD  aspirin EC 81 MG tablet Take 81 mg by mouth daily.    Historical Provider, MD  calcium acetate (PHOSLO) 667 MG capsule Take 667 mg by mouth 3 (three) times daily with meals.    Historical Provider, MD  cyclobenzaprine (FLEXERIL) 10 MG tablet Take 1 tablet (10 mg total) by mouth 3 (three) times daily as needed for muscle spasms. 03/01/14   Esperanza SheetsUlugbek N Buriev, MD  insulin aspart (NOVOLOG) 100 UNIT/ML injection Inject 2 Units into the skin 3 (three) times daily with meals. 12/30/13   Alison MurrayAlma M Devine, MD  insulin glargine (LANTUS) 100 UNIT/ML injection Inject 0.1 mLs (10 Units total) into the skin at bedtime. 03/01/14   Esperanza SheetsUlugbek N Buriev, MD  isosorbide mononitrate (IMDUR) 15 mg TB24 24 hr tablet Take 0.5 tablets (15 mg total) by mouth daily. 03/01/14   Esperanza SheetsUlugbek N Buriev, MD  levothyroxine (SYNTHROID, LEVOTHROID) 25 MCG tablet Take 25 mcg by mouth daily.     Historical Provider, MD  midodrine (PROAMATINE) 5 MG tablet Take 10 mg by mouth 2 (two) times daily with a meal. On dialysis days. Monday,wednesday and friday    Historical Provider, MD  multivitamin (RENA-VIT) TABS tablet Take 1 tablet by mouth at bedtime. 12/30/13   Alison MurrayAlma M Devine, MD  nitroGLYCERIN (NITROSTAT) 0.4 MG SL tablet Place 0.4 mg under the tongue every 5 (five) minutes as needed for chest pain. For chest pain.    Historical Provider, MD  pantoprazole (PROTONIX) 40 MG tablet Take 40 mg by mouth daily.     Historical Provider, MD  Probiotic Product (PROBIOTIC PO) Take 1 capsule by mouth daily as needed (digestion).     Historical Provider, MD  simvastatin (ZOCOR) 80 MG tablet Take 1 tablet (80 mg total) by mouth every evening. 01/08/14   Hollice EspySendil K Krishnan, MD  tiotropium (SPIRIVA) 18  MCG inhalation capsule Place 1 capsule (18 mcg total) into inhaler and inhale daily. 11/13/13   Rhetta MuraJai-Gurmukh Samtani, MD   BP 161/78  Pulse 96  Temp(Src) 98.1 F (36.7 C)  Resp 22  SpO2 93% Physical Exam  Nursing note and vitals reviewed. Constitutional: She appears well-developed and well-nourished. No  distress.  HENT:  Head: Normocephalic and atraumatic.  Mouth/Throat: Oropharynx is clear and moist. No oropharyngeal exudate.  Eyes: Conjunctivae and EOM are normal. Pupils are equal, round, and reactive to light. Right eye exhibits no discharge. Left eye exhibits no discharge. No scleral icterus.  Neck: Normal range of motion. Neck supple. No JVD present. No thyromegaly present.  Cardiovascular: Normal rate, regular rhythm, normal heart sounds and intact distal pulses.  Exam reveals no gallop and no friction rub.   No murmur heard. Pulmonary/Chest: No respiratory distress. She has wheezes. She has no rales.  Prolonged expiration, mild tachypnea, speaks in full sentences  Abdominal: Soft. Bowel sounds are normal. She exhibits no distension and no mass. There is no tenderness.  Musculoskeletal: Normal range of motion. She exhibits no edema and no tenderness.  Lymphadenopathy:    She has no cervical adenopathy.  Neurological: She is alert. Coordination normal.  Skin: Skin is warm and dry. No rash noted. No erythema.  Psychiatric: She has a normal mood and affect. Her behavior is normal.    ED Course  Procedures (including critical care time) Labs Review Labs Reviewed  CBC WITH DIFFERENTIAL - Abnormal; Notable for the following:    WBC 10.7 (*)    Hemoglobin 11.6 (*)    RDW 16.9 (*)    Neutrophils Relative % 79 (*)    Neutro Abs 8.5 (*)    Lymphocytes Relative 11 (*)    All other components within normal limits  COMPREHENSIVE METABOLIC PANEL - Abnormal; Notable for the following:    Sodium 133 (*)    Chloride 93 (*)    Glucose, Bld 147 (*)    BUN 26 (*)    Creatinine, Ser 2.57  (*)    Albumin 3.4 (*)    Alkaline Phosphatase 164 (*)    GFR calc non Af Amer 18 (*)    GFR calc Af Amer 21 (*)    All other components within normal limits  I-STAT CHEM 8, ED - Abnormal; Notable for the following:    Sodium 135 (*)    Chloride 94 (*)    BUN 26 (*)    Creatinine, Ser 2.70 (*)    Glucose, Bld 149 (*)    All other components within normal limits  I-STAT CG4 LACTIC ACID, ED - Abnormal; Notable for the following:    Lactic Acid, Venous 0.49 (*)    All other components within normal limits  I-STAT ARTERIAL BLOOD GAS, ED - Abnormal; Notable for the following:    pCO2 arterial 52.4 (*)    pO2, Arterial 43.0 (*)    Bicarbonate 29.4 (*)    Acid-Base Excess 3.0 (*)    All other components within normal limits  BLOOD GAS, ARTERIAL  I-STAT TROPOININ, ED    Imaging Review Dg Chest Port 1 View  03/11/2014   CLINICAL DATA:  Shortness of breath  EXAM: PORTABLE CHEST - 1 VIEW  COMPARISON:  03/01/2014  FINDINGS: Left IJ dialysis catheter, tip at the level of the upper right atrium.  Stable mild cardiomegaly. Widening of the upper mediastinum related to apical lordotic positioning. Small to moderate bilateral pleural effusions. These are likely stable from prior given differences in patient positioning, with inferior flow of fluid on the right. Venous congestion with diffuse interstitial coarsening and cephalized blood flow, increased from previous.  IMPRESSION: 1. Small to moderate bilateral pleural effusions. 2. Pulmonary venous hypertension.   Electronically Signed   By: Tiburcio Pea M.D.   On:  03/11/2014 22:35     EKG Interpretation   Date/Time:  Saturday Mar 11 2014 23:42:37 EDT Ventricular Rate:  93 PR Interval:  162 QRS Duration: 112 QT Interval:  396 QTC Calculation: 493 R Axis:   49 Text Interpretation:  Sinus rhythm Incomplete left bundle branch block LVH  with secondary repolarization abnormality Borderline prolonged QT interval  since last tracing no  significant change Confirmed by Senetra Dillin  MD, Veera Stapleton  ([id number]) on 03/11/2014 11:58:43 PM      MDM   Final diagnoses:  Hypoxia  Renal failure  Pleural effusion    The patient is persistently hypoxic, on her normal oxygen requirement she has had some 86-88%. She does not appear to be in any more distress than usual, she does not appear to be in fluid overload. We'll check a Friday of labs to rule out infection, pulmonary edema, and cardiac source of her symptoms.  The patient has no symptoms, she has not had any symptoms throughout her stay on repeat exam there is no change. She still has no shortness of breath, still is not having any increased work of breathing and her oxygen saturations are 93% on 3-1/2 L by nasal cannula. The chest x-ray is unchanged from prior x-rays and shows pleural effusions which have been present on prior films but no pulmonary edema or focal infiltrates. She states this is better than she usually is. I do not have the exact cause of the patient's hypoxia or dyspnea. At this point the patient appears stable. Of note the patient has had problems with pulmonary embolism in the past but was not an anticoagulation candidate and thus was not placed on any anticoagulants. She had a GI bleed in March of 2015. At this point she appears stable for discharge.    Vida Roller, MD 03/12/14 Burna Mortimer

## 2014-03-11 NOTE — ED Notes (Signed)
All her appendages are cold.  02 sat across her forehead  Is  Picking up

## 2014-03-12 NOTE — ED Notes (Signed)
The son has arrived top take pt home

## 2014-03-12 NOTE — Discharge Instructions (Signed)
Your testing this evening has shown that you have small amounts of fluid in your chest.  This is unchanged from prior x-rays. Your kidneys are also not working well but this also seems to be a chronic problem for you and was found on prior lab work. You should return to the hospital immediately if he develops severe or worsening symptoms, you should have your oxygen to be between 3 and 4 liters all the time, I would highly recommend that he call your doctor on Monday morning to set up a followup exam early in the week.  Please call your doctor for a followup appointment within 24-48 hours. When you talk to your doctor please let them know that you were seen in the emergency department and have them acquire all of your records so that they can discuss the findings with you and formulate a treatment plan to fully care for your new and ongoing problems.

## 2014-03-12 NOTE — ED Notes (Signed)
The pts son is coming to get her.   Staying on 02 until  Her son arrives

## 2014-03-12 NOTE — ED Notes (Signed)
The pt reports that she is getting ready to be discharged

## 2014-03-13 ENCOUNTER — Encounter (HOSPITAL_COMMUNITY): Payer: Self-pay | Admitting: Radiology

## 2014-03-13 ENCOUNTER — Inpatient Hospital Stay (HOSPITAL_COMMUNITY): Payer: Medicare Other

## 2014-03-13 ENCOUNTER — Inpatient Hospital Stay (HOSPITAL_COMMUNITY)
Admission: AD | Admit: 2014-03-13 | Discharge: 2014-03-23 | DRG: 207 | Disposition: A | Discharge status: Skilled Nursing Facility | Payer: Medicare Other | Source: Other Acute Inpatient Hospital | Attending: Internal Medicine | Admitting: Internal Medicine

## 2014-03-13 DIAGNOSIS — K921 Melena: Secondary | ICD-10-CM

## 2014-03-13 DIAGNOSIS — J9600 Acute respiratory failure, unspecified whether with hypoxia or hypercapnia: Secondary | ICD-10-CM

## 2014-03-13 DIAGNOSIS — E039 Hypothyroidism, unspecified: Secondary | ICD-10-CM

## 2014-03-13 DIAGNOSIS — M899 Disorder of bone, unspecified: Secondary | ICD-10-CM | POA: Diagnosis present

## 2014-03-13 DIAGNOSIS — Z992 Dependence on renal dialysis: Secondary | ICD-10-CM

## 2014-03-13 DIAGNOSIS — I255 Ischemic cardiomyopathy: Secondary | ICD-10-CM

## 2014-03-13 DIAGNOSIS — M949 Disorder of cartilage, unspecified: Secondary | ICD-10-CM

## 2014-03-13 DIAGNOSIS — I498 Other specified cardiac arrhythmias: Secondary | ICD-10-CM

## 2014-03-13 DIAGNOSIS — S98139A Complete traumatic amputation of one unspecified lesser toe, initial encounter: Secondary | ICD-10-CM

## 2014-03-13 DIAGNOSIS — Z7982 Long term (current) use of aspirin: Secondary | ICD-10-CM

## 2014-03-13 DIAGNOSIS — I120 Hypertensive chronic kidney disease with stage 5 chronic kidney disease or end stage renal disease: Secondary | ICD-10-CM | POA: Diagnosis present

## 2014-03-13 DIAGNOSIS — I10 Essential (primary) hypertension: Secondary | ICD-10-CM | POA: Diagnosis present

## 2014-03-13 DIAGNOSIS — I739 Peripheral vascular disease, unspecified: Secondary | ICD-10-CM | POA: Diagnosis present

## 2014-03-13 DIAGNOSIS — N390 Urinary tract infection, site not specified: Secondary | ICD-10-CM | POA: Diagnosis present

## 2014-03-13 DIAGNOSIS — R9431 Abnormal electrocardiogram [ECG] [EKG]: Secondary | ICD-10-CM

## 2014-03-13 DIAGNOSIS — R7989 Other specified abnormal findings of blood chemistry: Secondary | ICD-10-CM

## 2014-03-13 DIAGNOSIS — N2581 Secondary hyperparathyroidism of renal origin: Secondary | ICD-10-CM | POA: Diagnosis present

## 2014-03-13 DIAGNOSIS — K922 Gastrointestinal hemorrhage, unspecified: Secondary | ICD-10-CM

## 2014-03-13 DIAGNOSIS — E119 Type 2 diabetes mellitus without complications: Secondary | ICD-10-CM | POA: Diagnosis present

## 2014-03-13 DIAGNOSIS — R778 Other specified abnormalities of plasma proteins: Secondary | ICD-10-CM

## 2014-03-13 DIAGNOSIS — Z79899 Other long term (current) drug therapy: Secondary | ICD-10-CM

## 2014-03-13 DIAGNOSIS — E7800 Pure hypercholesterolemia, unspecified: Secondary | ICD-10-CM | POA: Diagnosis present

## 2014-03-13 DIAGNOSIS — Z86718 Personal history of other venous thrombosis and embolism: Secondary | ICD-10-CM

## 2014-03-13 DIAGNOSIS — Z794 Long term (current) use of insulin: Secondary | ICD-10-CM

## 2014-03-13 DIAGNOSIS — I959 Hypotension, unspecified: Secondary | ICD-10-CM

## 2014-03-13 DIAGNOSIS — I509 Heart failure, unspecified: Secondary | ICD-10-CM

## 2014-03-13 DIAGNOSIS — K31819 Angiodysplasia of stomach and duodenum without bleeding: Secondary | ICD-10-CM

## 2014-03-13 DIAGNOSIS — B9689 Other specified bacterial agents as the cause of diseases classified elsewhere: Secondary | ICD-10-CM | POA: Diagnosis present

## 2014-03-13 DIAGNOSIS — I2589 Other forms of chronic ischemic heart disease: Secondary | ICD-10-CM | POA: Diagnosis present

## 2014-03-13 DIAGNOSIS — Z66 Do not resuscitate: Secondary | ICD-10-CM | POA: Diagnosis not present

## 2014-03-13 DIAGNOSIS — I2510 Atherosclerotic heart disease of native coronary artery without angina pectoris: Secondary | ICD-10-CM

## 2014-03-13 DIAGNOSIS — I5043 Acute on chronic combined systolic (congestive) and diastolic (congestive) heart failure: Secondary | ICD-10-CM | POA: Diagnosis present

## 2014-03-13 DIAGNOSIS — I469 Cardiac arrest, cause unspecified: Secondary | ICD-10-CM

## 2014-03-13 DIAGNOSIS — D62 Acute posthemorrhagic anemia: Secondary | ICD-10-CM

## 2014-03-13 DIAGNOSIS — J9620 Acute and chronic respiratory failure, unspecified whether with hypoxia or hypercapnia: Principal | ICD-10-CM | POA: Diagnosis present

## 2014-03-13 DIAGNOSIS — B351 Tinea unguium: Secondary | ICD-10-CM

## 2014-03-13 DIAGNOSIS — R55 Syncope and collapse: Secondary | ICD-10-CM

## 2014-03-13 DIAGNOSIS — N186 End stage renal disease: Secondary | ICD-10-CM

## 2014-03-13 DIAGNOSIS — R5381 Other malaise: Secondary | ICD-10-CM | POA: Diagnosis not present

## 2014-03-13 DIAGNOSIS — R579 Shock, unspecified: Secondary | ICD-10-CM | POA: Diagnosis present

## 2014-03-13 DIAGNOSIS — Z888 Allergy status to other drugs, medicaments and biological substances status: Secondary | ICD-10-CM

## 2014-03-13 DIAGNOSIS — I5042 Chronic combined systolic (congestive) and diastolic (congestive) heart failure: Secondary | ICD-10-CM

## 2014-03-13 DIAGNOSIS — J449 Chronic obstructive pulmonary disease, unspecified: Secondary | ICD-10-CM | POA: Diagnosis present

## 2014-03-13 DIAGNOSIS — D649 Anemia, unspecified: Secondary | ICD-10-CM | POA: Diagnosis present

## 2014-03-13 DIAGNOSIS — K219 Gastro-esophageal reflux disease without esophagitis: Secondary | ICD-10-CM | POA: Diagnosis present

## 2014-03-13 DIAGNOSIS — E1129 Type 2 diabetes mellitus with other diabetic kidney complication: Secondary | ICD-10-CM | POA: Diagnosis present

## 2014-03-13 DIAGNOSIS — R531 Weakness: Secondary | ICD-10-CM

## 2014-03-13 DIAGNOSIS — I252 Old myocardial infarction: Secondary | ICD-10-CM

## 2014-03-13 DIAGNOSIS — Z86711 Personal history of pulmonary embolism: Secondary | ICD-10-CM

## 2014-03-13 DIAGNOSIS — R001 Bradycardia, unspecified: Secondary | ICD-10-CM

## 2014-03-13 DIAGNOSIS — N1330 Unspecified hydronephrosis: Secondary | ICD-10-CM

## 2014-03-13 DIAGNOSIS — J4489 Other specified chronic obstructive pulmonary disease: Secondary | ICD-10-CM | POA: Diagnosis present

## 2014-03-13 DIAGNOSIS — Z87891 Personal history of nicotine dependence: Secondary | ICD-10-CM

## 2014-03-13 HISTORY — DX: Personal history of other medical treatment: Z92.89

## 2014-03-13 HISTORY — DX: Cardiac arrest, cause unspecified: I46.9

## 2014-03-13 HISTORY — DX: Anemia, unspecified: D64.9

## 2014-03-13 LAB — POCT I-STAT 3, ART BLOOD GAS (G3+)
Acid-Base Excess: 3 mmol/L — ABNORMAL HIGH (ref 0.0–2.0)
Acid-Base Excess: 5 mmol/L — ABNORMAL HIGH (ref 0.0–2.0)
Bicarbonate: 29.4 mEq/L — ABNORMAL HIGH (ref 20.0–24.0)
Bicarbonate: 31.2 mEq/L — ABNORMAL HIGH (ref 20.0–24.0)
O2 Saturation: 87 %
O2 Saturation: 92 %
PCO2 ART: 48.8 mmHg — AB (ref 35.0–45.0)
PH ART: 7.407 (ref 7.350–7.450)
PO2 ART: 54 mmHg — AB (ref 80.0–100.0)
TCO2: 31 mmol/L (ref 0–100)
TCO2: 33 mmol/L (ref 0–100)
pCO2 arterial: 49.5 mmHg — ABNORMAL HIGH (ref 35.0–45.0)
pH, Arterial: 7.388 (ref 7.350–7.450)
pO2, Arterial: 64 mmHg — ABNORMAL LOW (ref 80.0–100.0)

## 2014-03-13 LAB — BASIC METABOLIC PANEL
BUN: 39 mg/dL — ABNORMAL HIGH (ref 6–23)
CALCIUM: 8.1 mg/dL — AB (ref 8.4–10.5)
CO2: 25 mEq/L (ref 19–32)
Chloride: 94 mEq/L — ABNORMAL LOW (ref 96–112)
Creatinine, Ser: 2.95 mg/dL — ABNORMAL HIGH (ref 0.50–1.10)
GFR, EST AFRICAN AMERICAN: 17 mL/min — AB (ref >90)
GFR, EST NON AFRICAN AMERICAN: 15 mL/min — AB (ref >90)
Glucose, Bld: 192 mg/dL — ABNORMAL HIGH (ref 70–99)
Potassium: 4.2 mEq/L (ref 3.7–5.3)
SODIUM: 137 meq/L (ref 137–147)

## 2014-03-13 LAB — CARBOXYHEMOGLOBIN
CARBOXYHEMOGLOBIN: 1.4 % (ref 0.5–1.5)
Methemoglobin: 0.9 % (ref 0.0–1.5)
O2 Saturation: 62.3 %
Total hemoglobin: 11.7 g/dL — ABNORMAL LOW (ref 12.0–16.0)

## 2014-03-13 LAB — URINALYSIS, ROUTINE W REFLEX MICROSCOPIC
Glucose, UA: NEGATIVE mg/dL
Ketones, ur: 15 mg/dL — AB
Nitrite: NEGATIVE
PROTEIN: 100 mg/dL — AB
SPECIFIC GRAVITY, URINE: 1.019 (ref 1.005–1.030)
UROBILINOGEN UA: 1 mg/dL (ref 0.0–1.0)
pH: 5 (ref 5.0–8.0)

## 2014-03-13 LAB — TROPONIN I

## 2014-03-13 LAB — GLUCOSE, CAPILLARY
GLUCOSE-CAPILLARY: 182 mg/dL — AB (ref 70–99)
GLUCOSE-CAPILLARY: 87 mg/dL (ref 70–99)
Glucose-Capillary: 163 mg/dL — ABNORMAL HIGH (ref 70–99)
Glucose-Capillary: 103 mg/dL — ABNORMAL HIGH (ref 70–99)

## 2014-03-13 LAB — URINE MICROSCOPIC-ADD ON

## 2014-03-13 LAB — PROTIME-INR
INR: 1.07 (ref 0.00–1.49)
PROTHROMBIN TIME: 13.7 s (ref 11.6–15.2)

## 2014-03-13 LAB — APTT: aPTT: 34 seconds (ref 24–37)

## 2014-03-13 LAB — MRSA PCR SCREENING: MRSA by PCR: NEGATIVE

## 2014-03-13 MED ORDER — HEPARIN BOLUS VIA INFUSION
3500.0000 [IU] | Freq: Once | INTRAVENOUS | Status: AC
  Administered 2014-03-13: 3500 [IU] via INTRAVENOUS
  Filled 2014-03-13: qty 3500

## 2014-03-13 MED ORDER — LIDOCAINE-PRILOCAINE 2.5-2.5 % EX CREA
1.0000 "application " | TOPICAL_CREAM | CUTANEOUS | Status: DC | PRN
  Filled 2014-03-13: qty 5

## 2014-03-13 MED ORDER — FENTANYL BOLUS VIA INFUSION
50.0000 ug | INTRAVENOUS | Status: DC | PRN
  Filled 2014-03-13: qty 50

## 2014-03-13 MED ORDER — DOXERCALCIFEROL 4 MCG/2ML IV SOLN
2.0000 ug | Freq: Once | INTRAVENOUS | Status: DC
  Filled 2014-03-13: qty 2

## 2014-03-13 MED ORDER — HEPARIN SODIUM (PORCINE) 1000 UNIT/ML DIALYSIS: 1000.0000 [IU] | INTRAMUSCULAR | Status: DC | PRN

## 2014-03-13 MED ORDER — SODIUM CHLORIDE 0.9 % IV SOLN: 100.0000 mL | INTRAVENOUS | Status: DC | PRN

## 2014-03-13 MED ORDER — PENTAFLUOROPROP-TETRAFLUOROETH EX AERO: 1.0000 "application " | INHALATION_SPRAY | CUTANEOUS | Status: DC | PRN

## 2014-03-13 MED ORDER — IOHEXOL 350 MG/ML SOLN
77.0000 mL | Freq: Once | INTRAVENOUS | Status: AC | PRN
  Administered 2014-03-13: 77 mL via INTRAVENOUS

## 2014-03-13 MED ORDER — HEPARIN (PORCINE) IN NACL 100-0.45 UNIT/ML-% IJ SOLN
INTRAMUSCULAR | Status: AC
  Filled 2014-03-13: qty 250

## 2014-03-13 MED ORDER — SODIUM CHLORIDE 0.9 % IV SOLN
1.0000 ug/kg/min | INTRAVENOUS | Status: DC
  Filled 2014-03-13: qty 20

## 2014-03-13 MED ORDER — FENTANYL CITRATE 0.05 MG/ML IJ SOLN
100.0000 ug | INTRAMUSCULAR | Status: DC | PRN
  Administered 2014-03-13 – 2014-03-14 (×3): 100 ug via INTRAVENOUS

## 2014-03-13 MED ORDER — CISATRACURIUM BOLUS VIA INFUSION
0.0500 mg/kg | INTRAVENOUS | Status: DC | PRN
  Filled 2014-03-13: qty 4

## 2014-03-13 MED ORDER — CISATRACURIUM BOLUS VIA INFUSION
0.1000 mg/kg | Freq: Once | INTRAVENOUS | Status: DC
  Filled 2014-03-13: qty 7

## 2014-03-13 MED ORDER — MIDAZOLAM HCL 5 MG/ML IJ SOLN: 2.0000 mg | Freq: Once | INTRAMUSCULAR | Status: DC

## 2014-03-13 MED ORDER — INSULIN ASPART 100 UNIT/ML ~~LOC~~ SOLN: 0.0000 [IU] | Freq: Three times a day (TID) | SUBCUTANEOUS | Status: DC

## 2014-03-13 MED ORDER — ASPIRIN 81 MG PO CHEW
CHEWABLE_TABLET | ORAL | Status: AC
  Filled 2014-03-13: qty 1

## 2014-03-13 MED ORDER — MUPIROCIN CALCIUM 2 % EX CREA
TOPICAL_CREAM | Freq: Every day | CUTANEOUS | Status: DC
  Administered 2014-03-13 – 2014-03-19 (×7): via TOPICAL
  Administered 2014-03-20: 1 via TOPICAL
  Administered 2014-03-23: 10:00:00 via TOPICAL
  Filled 2014-03-13: qty 15

## 2014-03-13 MED ORDER — CHLORHEXIDINE GLUCONATE 0.12 % MT SOLN
15.0000 mL | Freq: Two times a day (BID) | OROMUCOSAL | Status: DC
  Administered 2014-03-13 – 2014-03-18 (×11): 15 mL via OROMUCOSAL
  Filled 2014-03-13 (×11): qty 15

## 2014-03-13 MED ORDER — SODIUM CHLORIDE 0.9 % IV SOLN: 2000.0000 mL | Freq: Once | INTRAVENOUS | Status: DC

## 2014-03-13 MED ORDER — DOXERCALCIFEROL 4 MCG/2ML IV SOLN
2.0000 ug | Freq: Once | INTRAVENOUS | Status: AC
  Administered 2014-03-14: 2 ug via INTRAVENOUS
  Filled 2014-03-13: qty 2

## 2014-03-13 MED ORDER — FENTANYL CITRATE 0.05 MG/ML IJ SOLN: 100.0000 ug | Freq: Once | INTRAMUSCULAR | Status: DC

## 2014-03-13 MED ORDER — BIOTENE DRY MOUTH MT LIQD
15.0000 mL | Freq: Four times a day (QID) | OROMUCOSAL | Status: DC
  Administered 2014-03-14 – 2014-03-20 (×24): 15 mL via OROMUCOSAL

## 2014-03-13 MED ORDER — MIDAZOLAM BOLUS VIA INFUSION
2.0000 mg | INTRAVENOUS | Status: DC | PRN
  Administered 2014-03-13: 0 mg/h via INTRAVENOUS
  Filled 2014-03-13: qty 2

## 2014-03-13 MED ORDER — MIDAZOLAM HCL 5 MG/ML IJ SOLN: 2.0000 mg | Freq: Once | INTRAMUSCULAR | Status: DC | PRN

## 2014-03-13 MED ORDER — ATORVASTATIN CALCIUM 40 MG PO TABS
40.0000 mg | ORAL_TABLET | Freq: Every day | ORAL | Status: DC
  Administered 2014-03-13 – 2014-03-22 (×9): 40 mg
  Filled 2014-03-13 (×11): qty 1

## 2014-03-13 MED ORDER — ALTEPLASE 2 MG IJ SOLR
2.0000 mg | Freq: Once | INTRAMUSCULAR | Status: AC | PRN
  Filled 2014-03-13: qty 2

## 2014-03-13 MED ORDER — ASPIRIN 300 MG RE SUPP
300.0000 mg | RECTAL | Status: DC
Start: 2014-03-13 — End: 2014-03-13

## 2014-03-13 MED ORDER — NOREPINEPHRINE BITARTRATE 1 MG/ML IV SOLN
0.5000 ug/min | INTRAVENOUS | Status: DC
  Administered 2014-03-14: 5 ug/min via INTRAVENOUS
  Filled 2014-03-13 (×2): qty 4

## 2014-03-13 MED ORDER — SODIUM CHLORIDE 0.9 % IV SOLN
25.0000 ug/h | INTRAVENOUS | Status: DC
  Administered 2014-03-13: 50 ug/h via INTRAVENOUS
  Administered 2014-03-14 – 2014-03-16 (×2): 150 ug/h via INTRAVENOUS
  Administered 2014-03-17 – 2014-03-18 (×2): 100 ug/h via INTRAVENOUS
  Administered 2014-03-18 (×2): 50 ug/h via INTRAVENOUS
  Filled 2014-03-13 (×5): qty 50

## 2014-03-13 MED ORDER — LIDOCAINE HCL (PF) 1 % IJ SOLN
5.0000 mL | INTRAMUSCULAR | Status: DC | PRN
  Filled 2014-03-13: qty 5

## 2014-03-13 MED ORDER — HEPARIN SODIUM (PORCINE) 5000 UNIT/ML IJ SOLN
5000.0000 [IU] | Freq: Three times a day (TID) | INTRAMUSCULAR | Status: DC
Start: 2014-03-13 — End: 2014-03-13
  Filled 2014-03-13 (×3): qty 1

## 2014-03-13 MED ORDER — ASPIRIN EC 81 MG PO TBEC
81.0000 mg | DELAYED_RELEASE_TABLET | Freq: Every day | ORAL | Status: DC
  Administered 2014-03-13: 81 mg via ORAL
  Filled 2014-03-13 (×2): qty 1

## 2014-03-13 MED ORDER — HEPARIN SOD (PORCINE) IN D5W 100 UNIT/ML IV SOLN
900.0000 [IU]/h | INTRAVENOUS | Status: DC
  Administered 2014-03-13: 1100 [IU]/h via INTRAVENOUS
  Filled 2014-03-13 (×2): qty 250

## 2014-03-13 MED ORDER — ARTIFICIAL TEARS OP OINT: 1.0000 "application " | TOPICAL_OINTMENT | Freq: Three times a day (TID) | OPHTHALMIC | Status: DC

## 2014-03-13 MED ORDER — NEPRO/CARBSTEADY PO LIQD
237.0000 mL | ORAL | Status: DC | PRN
  Filled 2014-03-13: qty 237

## 2014-03-13 MED ORDER — MIDAZOLAM HCL 2 MG/2ML IJ SOLN
INTRAMUSCULAR | Status: AC
Start: 2014-03-13 — End: 2014-03-13
  Filled 2014-03-13: qty 2

## 2014-03-13 MED ORDER — SODIUM CHLORIDE 0.9 % IV SOLN
1.0000 mg/h | INTRAVENOUS | Status: DC
  Administered 2014-03-13: 2 mg/h via INTRAVENOUS
  Filled 2014-03-13: qty 10

## 2014-03-13 MED ORDER — MUPIROCIN 2 % EX OINT
TOPICAL_OINTMENT | CUTANEOUS | Status: AC
  Filled 2014-03-13: qty 22

## 2014-03-13 MED ORDER — MIDAZOLAM BOLUS VIA INFUSION
2.0000 mg | INTRAVENOUS | Status: DC | PRN
  Filled 2014-03-13: qty 2

## 2014-03-13 MED ORDER — INSULIN ASPART 100 UNIT/ML ~~LOC~~ SOLN
0.0000 [IU] | SUBCUTANEOUS | Status: DC
  Administered 2014-03-13 – 2014-03-14 (×2): 2 [IU] via SUBCUTANEOUS
  Administered 2014-03-14: 1 [IU] via SUBCUTANEOUS
  Administered 2014-03-15: 2 [IU] via SUBCUTANEOUS
  Administered 2014-03-15 (×2): 3 [IU] via SUBCUTANEOUS
  Administered 2014-03-15 – 2014-03-16 (×3): 2 [IU] via SUBCUTANEOUS
  Administered 2014-03-16: 1 [IU] via SUBCUTANEOUS
  Administered 2014-03-16: 3 [IU] via SUBCUTANEOUS
  Administered 2014-03-16 (×3): 2 [IU] via SUBCUTANEOUS
  Administered 2014-03-17 (×2): 3 [IU] via SUBCUTANEOUS
  Administered 2014-03-17 (×2): 1 [IU] via SUBCUTANEOUS
  Administered 2014-03-17: 2 [IU] via SUBCUTANEOUS
  Administered 2014-03-17: 3 [IU] via SUBCUTANEOUS
  Administered 2014-03-17 – 2014-03-18 (×2): 1 [IU] via SUBCUTANEOUS
  Administered 2014-03-18 (×2): 2 [IU] via SUBCUTANEOUS
  Administered 2014-03-18: 1 [IU] via SUBCUTANEOUS
  Administered 2014-03-18: 2 [IU] via SUBCUTANEOUS
  Administered 2014-03-19: 1 [IU] via SUBCUTANEOUS
  Administered 2014-03-19: 2 [IU] via SUBCUTANEOUS
  Administered 2014-03-19: 1 [IU] via SUBCUTANEOUS
  Administered 2014-03-19: 3 [IU] via SUBCUTANEOUS
  Administered 2014-03-20: 2 [IU] via SUBCUTANEOUS
  Administered 2014-03-20: 3 [IU] via SUBCUTANEOUS
  Administered 2014-03-20: 2 [IU] via SUBCUTANEOUS
  Administered 2014-03-21: 1 [IU] via SUBCUTANEOUS
  Administered 2014-03-21: 3 [IU] via SUBCUTANEOUS
  Administered 2014-03-21: 1 [IU] via SUBCUTANEOUS
  Administered 2014-03-21: 2 [IU] via SUBCUTANEOUS
  Administered 2014-03-21: 1 [IU] via SUBCUTANEOUS
  Administered 2014-03-21: 3 [IU] via SUBCUTANEOUS
  Administered 2014-03-22: 7 [IU] via SUBCUTANEOUS
  Administered 2014-03-22 – 2014-03-23 (×2): 3 [IU] via SUBCUTANEOUS

## 2014-03-13 MED ORDER — SODIUM CHLORIDE 0.9 % IV SOLN
INTRAVENOUS | Status: DC
Start: 2014-03-13 — End: 2014-03-23
  Administered 2014-03-20: 09:00:00 via INTRAVENOUS

## 2014-03-13 NOTE — Progress Notes (Signed)
1217 Arrived to [facility] unit (2M11) via care link. Dr. Molli Knock in to assess. Orders to transfer to 2H06 for possible cooling. Report called to receiving nurse Jillyn Hidden, RN). Patient transfer via bed with all belongings at 1325.

## 2014-03-13 NOTE — H&P (Signed)
PULMONARY / CRITICAL CARE MEDICINE   Name: [name] [name] MRN: [id number] DOB: 1941-08-10    ADMISSION DATE:  03/13/2014   REFERRING MD : Duke Salvia ed PRIMARY SERVICE: PCCM  CHIEF COMPLAINT:  pea arrest  BRIEF PATIENT DESCRIPTION:  73 yo WF with ESRD with HD via left ij tunneled diatek cath who was on hr way to HD 5/11 in [facility] and had a PEA ARREST and was treated with EPI and CPR with ROSC. Taken to [facility] ED and intubated and then transferred to [facility]. Originally taken to MICU but will be transferred to CCU  For hypothermia protocol. She is intubated , not on pressor support currently. SIGNIFICANT EVENTS / STUDIES:  5/11 pea arrest  LINES / TUBES: 5/11 ott>> 5/11 left fem cvl>>  CULTURES:   ANTIBIOTICS:   HISTORY OF PRESENT ILLNESS:   73 yo WF with ESRD with HD via left ij tunneled diatek cath who was on hr way to HD 5/11 in [facility] and had a PEA ARREST and was treated with EPI and CPR with ROSC. Taken to [facility] ED and intubated and then transferred to [facility]. Originally taken to MICU but will be transferred to CCU  For hypothermia protocol. She is intubated , not on pressor support currently.  PAST MEDICAL HISTORY :  Past Medical History  Diagnosis Date  . ESRD on hemodialysis     Started HD in December 2013.  Cause of ESRD unknown.  Gets HD in [place] on MWF schedule. She had a right upper arm access which failed just short of one year.  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  She says she has "poor circulation" in the legs so they won't do a leg access. She remains catheter dependent as of   . Hypothyroidism   . Colitis 10/2011    C diff.   . Coronary artery disease 3/13    3V CAD at cath, failed RCA PCI  . Hypertension   . GERD (gastroesophageal reflux disease)   . Myocardial infarction   . Peripheral vascular disease     claudication.   Marland Kitchen  Hx of pulmonary embolus   . COPD (chronic obstructive pulmonary disease)     Nocturnal O2.   . Shortness of breath     uses O2 as needed  . Critical lower limb ischemia   . DVT (deep venous thrombosis) 2008    "right"  . Pneumonia     "several times"  . Type II diabetes mellitus     insulin requiring.   . CHF (congestive heart failure)     Volume mgt with HD  . Hypercholesteremia   . GI bleed March 2015   Past Surgical History  Procedure Laterality Date  . Tubal ligation    . Toe amputation Right     5th toe  . Tonsillectomy    . Insertion of dialysis catheter Left     chest  . Av fistula placement Right [id number]    RUE  . Bascilic vein transposition  May 18, 2012    First stage  . Bascilic vein transposition Left 07/27/12    Basilic Vein Transposition  . Arch aortogram  08/19/2012    Procedure: ARCH AORTOGRAM;  Surgeon: Fransisco Hertz, MD;  Location: [facility] OR;  Service: Vascular;  Laterality: N/A;  . Insertion of dialysis catheter  08/21/2012  Procedure: INSERTION OF DIALYSIS CATHETER;  Surgeon: Sherren Kerns, MD;  Location: [facility] OR;  Service: Vascular;  Laterality: Left;  internal jugular  . Cardiac catheterization  02/04/2012    LAD 40/99%, CRX OK, OM1 90% (small), RCA 90%/subtotal, failed PCI. Good L-R collaterals.  . Esophagogastroduodenoscopy N/A 01/06/2014    Procedure: ESOPHAGOGASTRODUODENOSCOPY (EGD);  Surgeon: Hilarie Fredrickson, MD;  Location[facility] ENDOSCOPY;  Service: Endoscopy;  Laterality: N/A;   Prior to Admission medications   Medication Sig Start Date End Date Taking? Authorizing Provider  albuterol (PROVENTIL) (5 MG/ML) 0.5% nebulizer solution Take 0.5 mLs (2.5 mg total) by nebulization every 6 (six) hours as needed for wheezing or shortness of breath. 03/01/14   Esperanza Sheets, MD  amLODipine (NORVASC) 5 MG tablet Take 1 tablet (5 mg total) by mouth daily. 03/04/14   Jeralyn Bennett, MD  aspirin EC 81 MG tablet Take 81 mg by mouth daily.    Historical Provider, MD   calcium acetate (PHOSLO) 667 MG capsule Take 667 mg by mouth 3 (three) times daily with meals.    Historical Provider, MD  carvedilol (COREG) 3.125 MG tablet Take 3.125 mg by mouth 2 (two) times daily with a meal.    Historical Provider, MD  cyclobenzaprine (FLEXERIL) 10 MG tablet Take 1 tablet (10 mg total) by mouth 3 (three) times daily as needed for muscle spasms. 03/01/14   Esperanza Sheets, MD  insulin aspart (NOVOLOG) 100 UNIT/ML injection Inject 2 Units into the skin 3 (three) times daily with meals. 12/30/13   Alison Murray, MD  insulin glargine (LANTUS) 100 UNIT/ML injection Inject 0.1 mLs (10 Units total) into the skin at bedtime. 03/01/14   Esperanza Sheets, MD  isosorbide mononitrate (IMDUR) 30 MG 24 hr tablet Take 15 mg by mouth daily.    Historical Provider, MD  levothyroxine (SYNTHROID, LEVOTHROID) 25 MCG tablet Take 25 mcg by mouth daily.     Historical Provider, MD  midodrine (PROAMATINE) 5 MG tablet Take 10 mg by mouth 2 (two) times daily with a meal. On dialysis days. Monday,wednesday and friday    Historical Provider, MD  multivitamin (RENA-VIT) TABS tablet Take 1 tablet by mouth at bedtime. 12/30/13   Alison Murray, MD  nitroGLYCERIN (NITROSTAT) 0.4 MG SL tablet Place 0.4 mg under the tongue every 5 (five) minutes as needed for chest pain. For chest pain.    Historical Provider, MD  pantoprazole (PROTONIX) 40 MG tablet Take 40 mg by mouth daily.     Historical Provider, MD  Probiotic Product (PROBIOTIC PO) Take 1 capsule by mouth daily as needed (digestion).     Historical Provider, MD  simvastatin (ZOCOR) 80 MG tablet Take 1 tablet (80 mg total) by mouth every evening. 01/08/14   Hollice Espy, MD  tiotropium (SPIRIVA) 18 MCG inhalation capsule Place 1 capsule (18 mcg total) into inhaler and inhale daily. 11/13/13   Rhetta Mura, MD   Allergies  Allergen Reactions  . Procaine Hcl Nausea And Vomiting and Other (See Comments)    [facility] has reaction of  "comatose"    FAMILY HISTORY:  Family History  Problem Relation Age of Onset  . Diabetes type II    . Hypertension    . Diabetes Mother   . Kidney disease Mother   . Diabetes Father   . Anesthesia problems Neg Hx    SOCIAL HISTORY:  reports that she quit smoking about 2 years ago. Her smoking use included Cigarettes. She has  a 40 pack-year smoking history. She has never used smokeless tobacco. She reports that she does not drink alcohol or use illicit drugs.  REVIEW OF SYSTEMS:  Na  SUBJECTIVE:   VITAL SIGNS:   HEMODYNAMICS:   VENTILATOR SETTINGS:   INTAKE / OUTPUT: Intake/Output   None     PHYSICAL EXAMINATION: General:  EWF sedated on vent Neuro: Wd's to noxious stimuli HEENT:  OGT. OTT, No JVD Cardiovascular: HSD RRR Lungs:  CTA Abdomen:  Soft ,Decreased bs Musculoskeletal:  Intact Skin: cool  LABS:  CBC  Recent Labs Lab 03/11/14 2225 03/11/14 2237  WBC 10.7*  --   HGB 11.6* 13.6  HCT 37.8 40.0  PLT 250  --    Coag's No results found for this basename: APTT, INR,  in the last 168 hours BMET  Recent Labs Lab 03/11/14 2225 03/11/14 2237  NA 133* 135*  K 3.8 3.7  CL 93* 94*  CO2 27  --   BUN 26* 26*  CREATININE 2.57* 2.70*  GLUCOSE 147* 149*   Electrolytes  Recent Labs Lab 03/11/14 2225  CALCIUM 9.2   Sepsis Markers  Recent Labs Lab 03/11/14 2237  LATICACIDVEN 0.49*   ABG  Recent Labs Lab 03/11/14 2222  PHART 7.358  PCO2ART 52.4*  PO2ART 43.0*   Liver Enzymes  Recent Labs Lab 03/11/14 2225  AST 29  ALT 28  ALKPHOS 164*  BILITOT 0.3  ALBUMIN 3.4*   Cardiac Enzymes No results found for this basename: TROPONINI, PROBNP,  in the last 168 hours Glucose No results found for this basename: GLUCAP,  in the last 168 hours  Imaging Dg Chest Port 1 View  03/11/2014   CLINICAL DATA:  Shortness of breath  EXAM: PORTABLE CHEST - 1 VIEW  COMPARISON:  03/01/2014  FINDINGS: Left IJ dialysis catheter, tip at the level of the  upper right atrium.  Stable mild cardiomegaly. Widening of the upper mediastinum related to apical lordotic positioning. Small to moderate bilateral pleural effusions. These are likely stable from prior given differences in patient positioning, with inferior flow of fluid on the right. Venous congestion with diffuse interstitial coarsening and cephalized blood flow, increased from previous.  IMPRESSION: 1. Small to moderate bilateral pleural effusions. 2. Pulmonary venous hypertension.   Electronically Signed   By: Tiburcio Pea M.D.   On: 03/11/2014 22:35       ASSESSMENT / PLAN:  PULMONARY A: VDRF secondary to PEA arrest     COPD, severe hypoxemia P:   - Vent bundle - BD as needed - Increase PEEP to 10 with repeat ABG at 4 PM. - CTA of the chest to R/O PE given severity of her hypoxemia and elevated D-dimer and PEA arrest, doubt will be positive however. - ABG and CXR in AM. - Move ET tube down 3 cm. - Repeat CXR now.  CARDIOVASCULAR A: Post PEA arrest     CAF     HTN     CAD     CHF P:  - Patient is evidently following commands now, will cancel hypothermia protocol. - Cards consult. - Pressors as needed. - Start heparin drip until R/O PE is done, if CTA is negative will d/c heparin drip.  RENAL A:  ESRD P:   - Renal consult called. - HD per renal. - Needs volume off.  GASTROINTESTINAL A:  GI protection P:   - PPI. - Consult nutrition for TF.  HEMATOLOGIC A:  No acute issue P:  - Follow CBC. -  Transfuse if <8 given CAD.  INFECTIOUS A:  No indication for infection P:   - Monitor WBC. - Monitor fever curves.  ENDOCRINE A:  DM  P:   - SSI. - TF.  NEUROLOGIC A:  Decreased LOC post PEA arrest. CT head 5/11 neg P:   - Minimize sedation. - Monitor mental status. - No where near trials for extubation.  TODAY'S SUMMARY:  73 yo WF with ESRD with HD via left ij tunneled diatek cath [place] and had [place] PEA ARREST and  was treated with EPI and CPR with ROSC. [facility] [place] [facility] and then [facility]. Originally taken to MICU but will be transferred to CCU  For hypothermia protocol. She is intubated , not on pressor support currently.  I have personally obtained a history, examined the patient, evaluated laboratory and imaging results, formulated the assessment and plan and placed orders.  CRITICAL CARE: The patient is critically ill with multiple organ systems failure and requires high complexity decision making for assessment and support, frequent evaluation and titration of therapies, application of advanced monitoring technologies and extensive interpretation of multiple databases. Critical Care Time devoted to patient care services described in this note is 45 minutes.   Alyson ReedyWesam G. Braylin Xu, M.D. [facility] Pulmonary/Critical Care Medicine. Pager: [phone]. After hours pager: [phone].

## 2014-03-13 NOTE — Consult Note (Addendum)
WOC wound consult note Reason for Consult: Consult requested for left foot wound.  Pt unresponsive and cannot discuss wound. Wound type: Full thickness Pressure Ulcer POA: This is NOT a pressure ulcer Measurement: .5X.5X.2cm Wound bed: 100% red and dry Drainage (amount, consistency, odor) No odor or drainage Periwound: Surrounded by dry, cracked, calloused skin. Dressing procedure/placement/frequency:  Bactroban to promote moist healing and provide antimicrobial benefits. Please re-consult if further assistance is needed.  Thank-you,  Cammie Mcgee MSN, RN, CWOCN, [place], CNS [phone]

## 2014-03-13 NOTE — Progress Notes (Signed)
IO removed, gauze dressing in place, no bleeding. [name] [name]

## 2014-03-13 NOTE — Consult Note (Signed)
Indication for Consultation:  Management of ESRD/hemodialysis; anemia, hypertension/volume and secondary hyperparathyroidism  HPI: [name] [name] is a 73 y.o. female admitted after having PEA arrest on her way to HD this am. She receives HD MWF @ [facility]. She required EPI and CPR, she was initially [facility] ED, intubated then [facility]. She was admitted to the CCU for further management. She was recently admitted to [facility] 4/24-29 and again 4/29-5/2 for syncope and she was seen in the ED 5.9 for hypoxemia.  Past Medical History  Diagnosis Date  . ESRD on hemodialysis     Started HD in December 2013.  Cause of ESRD unknown.  Gets HD in [place] MWF schedule. She had a right upper arm access which failed just short of one year.  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  She says she has "poor circulation" in the legs so they won't do a leg access. She remains catheter dependent as of   . Hypothyroidism   . Colitis 10/2011    C diff.   . Coronary artery disease 3/13    3V CAD at cath, failed RCA PCI  . Hypertension   . GERD (gastroesophageal reflux disease)   . Myocardial infarction   . Peripheral vascular disease     claudication.   Marland Kitchen. Hx of pulmonary embolus   . COPD (chronic obstructive pulmonary disease)     Nocturnal O2.   . Shortness of breath     uses O2 as needed  . Critical lower limb ischemia   . DVT (deep venous thrombosis) 2008    "right"  . Pneumonia     "several times"  . Type II diabetes mellitus     insulin requiring.   . CHF (congestive heart failure)     Volume mgt with HD  . Hypercholesteremia   . GI bleed March 2015   Past Surgical History  Procedure Laterality Date  . Tubal ligation    . Toe amputation Right     5th toe  . Tonsillectomy    . Insertion of dialysis catheter Left     chest  . Av fistula placement Right [id number]    RUE  . Bascilic vein transposition   May 18, 2012    First stage  . Bascilic vein transposition Left 07/27/12    Basilic Vein Transposition  . Arch aortogram  08/19/2012    Procedure: ARCH AORTOGRAM;  Surgeon: Fransisco HertzBrian L Chen, MD;  [facility];  Service: Vascular;  Laterality: N/A;  . Insertion of dialysis catheter  08/21/2012    Procedure: INSERTION OF DIALYSIS CATHETER;  Surgeon: Sherren Kernsharles E Fields, MD;  [facility];  Service: Vascular;  Laterality: Left;  internal jugular  . Cardiac catheterization  02/04/2012    LAD 40/99%, CRX OK, OM1 90% (small), RCA 90%/subtotal, failed PCI. Good L-R collaterals.  . Esophagogastroduodenoscopy N/A 01/06/2014    Procedure: ESOPHAGOGASTRODUODENOSCOPY (EGD);  Surgeon: Hilarie FredricksonJohn N Perry, MD;  [facility];  Service: Endoscopy;  Laterality: N/A;   Family History  Problem Relation Age of Onset  . Diabetes type II    . Hypertension    . Diabetes Mother   . Kidney disease Mother   . Diabetes Father   . Anesthesia problems Neg Hx    Social History:  reports that she quit smoking about 2 years ago. Her smoking use included Cigarettes. She  has a 40 pack-year smoking history. She has never used smokeless tobacco. She reports that she does not drink alcohol or use illicit drugs. Allergies  Allergen Reactions  . Procaine Hcl Nausea And Vomiting and Other (See Comments)    [facility] has reaction of "comatose"   Prior to Admission medications   Medication Sig Start Date End Date Taking? Authorizing Provider  albuterol (PROVENTIL) (5 MG/ML) 0.5% nebulizer solution Take 0.5 mLs (2.5 mg total) by nebulization every 6 (six) hours as needed for wheezing or shortness of breath. 03/01/14   Esperanza Sheets, MD  amLODipine (NORVASC) 5 MG tablet Take 1 tablet (5 mg total) by mouth daily. 03/04/14   Jeralyn Bennett, MD  aspirin EC 81 MG tablet Take 81 mg by mouth daily.    Historical Provider, MD  calcium acetate (PHOSLO) 667 MG capsule Take 667 mg by mouth 3 (three) times daily with  meals.    Historical Provider, MD  carvedilol (COREG) 3.125 MG tablet Take 3.125 mg by mouth 2 (two) times daily with a meal.    Historical Provider, MD  cyclobenzaprine (FLEXERIL) 10 MG tablet Take 1 tablet (10 mg total) by mouth 3 (three) times daily as needed for muscle spasms. 03/01/14   Esperanza Sheets, MD  insulin aspart (NOVOLOG) 100 UNIT/ML injection Inject 2 Units into the skin 3 (three) times daily with meals. 12/30/13   Alison Murray, MD  insulin glargine (LANTUS) 100 UNIT/ML injection Inject 0.1 mLs (10 Units total) into the skin at bedtime. 03/01/14   Esperanza Sheets, MD  isosorbide mononitrate (IMDUR) 30 MG 24 hr tablet Take 15 mg by mouth daily.    Historical Provider, MD  levothyroxine (SYNTHROID, LEVOTHROID) 25 MCG tablet Take 25 mcg by mouth daily.     Historical Provider, MD  midodrine (PROAMATINE) 5 MG tablet Take 10 mg by mouth 2 (two) times daily with a meal. On dialysis days. Monday,wednesday and friday    Historical Provider, MD  multivitamin (RENA-VIT) TABS tablet Take 1 tablet by mouth at bedtime. 12/30/13   Alison Murray, MD  nitroGLYCERIN (NITROSTAT) 0.4 MG SL tablet Place 0.4 mg under the tongue every 5 (five) minutes as needed for chest pain. For chest pain.    Historical Provider, MD  pantoprazole (PROTONIX) 40 MG tablet Take 40 mg by mouth daily.     Historical Provider, MD  Probiotic Product (PROBIOTIC PO) Take 1 capsule by mouth daily as needed (digestion).     Historical Provider, MD  simvastatin (ZOCOR) 80 MG tablet Take 1 tablet (80 mg total) by mouth every evening. 01/08/14   Hollice Espy, MD  tiotropium (SPIRIVA) 18 MCG inhalation capsule Place 1 capsule (18 mcg total) into inhaler and inhale daily. 11/13/13   Rhetta Mura, MD   Current Facility-Administered Medications  Medication Dose Route Frequency Provider Last Rate Last Dose  . 0.9 %  sodium chloride infusion   Intravenous Continuous Lonia Farber, MD 10 mL/hr at 03/13/14 1600    .  aspirin EC tablet 81 mg  81 mg Oral Daily Darden Palmer, MD   81 mg at 03/13/14 1430  . atorvastatin (LIPITOR) tablet 40 mg  40 mg Per Tube q1800 Laurey Morale, MD   40 mg at 03/13/14 1608  . fentaNYL (SUBLIMAZE) 10 mcg/mL in sodium chloride 0.9 % 250 mL infusion  25-300 mcg/hr Intravenous Continuous Vilinda Blanks Minor, NP 10 mL/hr at 03/13/14 1600 100 mcg/hr at 03/13/14 1600  . fentaNYL (SUBLIMAZE) injection 100  mcg  100 mcg Intravenous Q1H PRN Vilinda Blanks Minor, NP      . heparin ADULT infusion 100 units/ml (25000 units/250 ml)  1,100 Units/hr Intravenous Continuous Severiano Gilbert, [facility]      . heparin bolus via infusion 3,500 Units  3,500 Units Intravenous Once Severiano Gilbert, [facility]      . insulin aspart (novoLOG) injection 0-9 Units  0-9 Units Subcutaneous 6 times per day Lonia Farber, MD   2 Units at 03/13/14 1608  . midazolam (VERSED) bolus via infusion 2 mg  2 mg Intravenous Q2H PRN [name], MD   0 mg/hr at 03/13/14 1542  . mupirocin cream (BACTROBAN) 2 %   Topical Daily Konstantin Zubelevitskiy, MD      . norepinephrine (LEVOPHED) 4 mg in dextrose 5 % 250 mL infusion  0.5-10 mcg/min Intravenous Titrated Vilinda Blanks Minor, NP       Labs: Basic Metabolic Panel:  Recent Labs Lab 03/11/14 2225 03/11/14 2237 03/13/14 1416  NA 133* 135* 137  K 3.8 3.7 4.2  CL 93* 94* 94*  CO2 27  --  25  GLUCOSE 147* 149* 192*  BUN 26* 26* 39*  CREATININE 2.57* 2.70* 2.95*  CALCIUM 9.2  --  8.1*   Liver Function Tests:  Recent Labs Lab 03/11/14 2225  AST 29  ALT 28  ALKPHOS 164*  BILITOT 0.3  PROT 6.9  ALBUMIN 3.4*   No results found for this basename: LIPASE, AMYLASE,  in the last 168 hours No results found for this basename: AMMONIA,  in the last 168 hours CBC:  Recent Labs Lab 03/11/14 2225 03/11/14 2237  WBC 10.7*  --   NEUTROABS 8.5*  --   HGB 11.6* 13.6  HCT 37.8 40.0  MCV 95.2  --   PLT 250  --    Cardiac Enzymes:  Recent Labs Lab 03/13/14 1416   TROPONINI <0.30   CBG: No results found for this basename: GLUCAP,  in the last 168 hours Iron Studies: No results found for this basename: IRON, TIBC, TRANSFERRIN, FERRITIN,  in the last 72 hours Studies/Results: Dg Chest Port 1 View  03/13/2014   CLINICAL DATA:  Central line placement.  EXAM: PORTABLE CHEST - 1 VIEW  COMPARISON:  03/13/2014  FINDINGS: Right subclavian central line has been placed with the tip in the SVC. Left dialysis catheter, endotracheal tube and NG tube are unchanged. No pneumothorax. Bilateral lower lobe airspace opacities and effusions are again noted, stable. Heart is normal size.  IMPRESSION: New right subclavian central line in place with the tip in the SVC. No pneumothorax. Otherwise no significant change.   Electronically Signed   By: Charlett Nose M.D.   On: 03/13/2014 16:33   Dg Chest Port 1 View  03/13/2014   CLINICAL DATA:  Hypoxia  EXAM: PORTABLE CHEST - 1 VIEW  COMPARISON:  Study obtained earlier in the day  FINDINGS: Endotracheal tube tip is 3.9 cm above the carina. Central catheter tip is essentially at the cavoatrial junction. There is no a nasogastric tube with the tip and side-port in the stomach. No pneumothorax. There is consolidation in the left lower lobe with left effusion. There is more patchy infiltrate in the right lower lobe. There is moderate interstitial edema. Heart is mildly enlarged with pulmonary venous hypertension. No adenopathy. There is atherosclerotic change in the aorta.  IMPRESSION: Congestive heart failure. Question superimposed pneumonia left lower lobe. Tube and catheter positions as described without apparent pneumothorax.  Electronically Signed   By: Bretta Bang M.D.   On: 03/13/2014 14:14   Dg Chest Port 1 View  03/11/2014   CLINICAL DATA:  Shortness of breath  EXAM: PORTABLE CHEST - 1 VIEW  COMPARISON:  03/01/2014  FINDINGS: Left IJ dialysis catheter, tip at the level of the upper right atrium.  Stable mild cardiomegaly.  Widening of the upper mediastinum related to apical lordotic positioning. Small to moderate bilateral pleural effusions. These are likely stable from prior given differences in patient positioning, with inferior flow of fluid on the right. Venous congestion with diffuse interstitial coarsening and cephalized blood flow, increased from previous.  IMPRESSION: 1. Small to moderate bilateral pleural effusions. 2. Pulmonary venous hypertension.   Electronically Signed   By: Tiburcio Pea M.D.   On: 03/11/2014 22:35     Review of Systems: Unable to obtain, pt sedated on vent  Physical Exam: Filed Vitals:   03/13/14 1600 03/13/14 1614 03/13/14 1615 03/13/14 1630  BP: 87/64 87/64 90/46  92/39  Pulse: 84 83 83 85  Temp:  98.8 F (37.1 C)    TempSrc:  Oral    Resp: 30 30 30 30   Height:      Weight:      SpO2: 97% 99% 98% 98%     General: Sedated on vent, in no acute distress. Head: Normocephalic, atraumatic, sclera non-icteric, mucus membranes are moist Neck: Supple. JVD not elevated. Lungs: Clear bilaterally to auscultation without wheezes, rales, or rhonchi. Breathing is unlabored. Heart: RRR with S1 S2. No murmurs, rubs, or gallops appreciated. Abdomen: Soft, non-tender, non-distended with normoactive bowel sounds. No rebound/guarding. No obvious abdominal masses. M-S:  Strength and tone appear normal for age. Lower extremities: +1 LE edema Neuro: Alert and oriented X 3. Moves all extremities spontaneously. Psych:  sedated Dialysis Access: L IJ cath  Dialysis Orders: MWF @ [facility] 64kgs  2K/2.5Ca+ 4 hr No Heparin L IJ  400/1.5   hectrol 2 mcg IV/HD  No epogen or Venofer   Profle 4  Assessment/Plan: 1.  PEA arrest- management per primary, cards consulted. heparin gtt. ECHO pending for tomorrow. CT pending. Chest xray- pleural effusion 2.  ESRD -  MWF @ [facility], HD pending tomorrow. K+4.2 3.  Hypertension/volume  - 92/39, on levophed 4.  Anemia  - hgb 11.7   No outpt epo or Fe. Watch  CBC 5.  Metabolic bone disease -  Ca+ 8.1, phos 3.5. Cont hectorol q hd 6.  Nutrition - NPO, nutrition consulted 7. DM- per primary 8. CAD  Jetty Duhamel, NP [facility] [phone] 03/13/2014, 4:50 PM

## 2014-03-13 NOTE — Progress Notes (Signed)
45cc versed gtt wasted in sink witnessed by 2 RNs. 30cc versed gtt wasted in sink from [facility] hospital; witnessed by 2 RNs. Shearon Balo, RN

## 2014-03-13 NOTE — Consult Note (Signed)
CARDIOLOGY CONSULT NOTE  Patient ID: VEDIKA DUMLAO MRN: [id number] DOB/AGE: June 28, 1941 73 y.o.  Admit date: 03/13/2014 Primary Physician:Dr. Quintella Reichert Primary Cardiologist: Dr. Allyson Sabal Reason for Consultation: s/p PEA Arrest  HPI: Ms. [name] [name] is a 73 year old female with ESRD on HD MWF, prior DVT and PE, DM2, UGIB and extensive cardiac history notable for CAD s/p cath 02/2012 showing chronic sub-total occlusion of mid RCA with failed PCI 2/2 vessel calcification and tortuosity, recent NSTEMI 01/2014 not on anticoagulation, and recent hospital admission for cardiac arrest 02/2014 transferred from [facility] ED, intubated, and transferred to [facility].  Initially in MICU then transferred to CCU for hypothermia protocol, however on arrival to CCU, patient easily arousable and responding to commands.   Currently complaining of chest pain at site of compressions, intubated. Afebrile.  Review of systems complete and found to be negative unless listed above in HPI  Past Medical History:  Family History  Problem Relation Age of Onset  . Diabetes type II    . Hypertension    . Diabetes Mother   . Kidney disease Mother   . Diabetes Father   . Anesthesia problems Neg Hx     History   Social History  . Marital Status: Widowed    Spouse Name: N/A    Number of Children: N/A  . Years of Education: N/A   Occupational History  . Retired    Social History Main Topics  . Smoking status: Former Smoker -- 1.00 packs/day for 40 years    Types: Cigarettes    Quit date: 06/11/2011  . Smokeless tobacco: Never Used  . Alcohol Use: No  . Drug Use: No  . Sexual Activity: No   Other Topics Concern  . Not on file   Social History Narrative   Lives w/ son in [place].     Prescriptions prior to admission  Medication Sig Dispense Refill  . albuterol (PROVENTIL) (5 MG/ML) 0.5% nebulizer solution Take 0.5 mLs (2.5 mg total) by nebulization every 6 (six) hours as needed for wheezing or  shortness of breath.  20 mL  12  . amLODipine (NORVASC) 5 MG tablet Take 1 tablet (5 mg total) by mouth daily.  30 tablet  1  . aspirin EC 81 MG tablet Take 81 mg by mouth daily.      . calcium acetate (PHOSLO) 667 MG capsule Take 667 mg by mouth 3 (three) times daily with meals.      . carvedilol (COREG) 3.125 MG tablet Take 3.125 mg by mouth 2 (two) times daily with a meal.      . cyclobenzaprine (FLEXERIL) 10 MG tablet Take 1 tablet (10 mg total) by mouth 3 (three) times daily as needed for muscle spasms.  30 tablet  0  . insulin aspart (NOVOLOG) 100 UNIT/ML injection Inject 2 Units into the skin 3 (three) times daily with meals.  10 mL  1  . insulin glargine (LANTUS) 100 UNIT/ML injection Inject 0.1 mLs (10 Units total) into the skin at bedtime.  10 mL  1  . isosorbide mononitrate (IMDUR) 30 MG 24 hr tablet Take 15 mg by mouth daily.      Marland Kitchen levothyroxine (SYNTHROID, LEVOTHROID) 25 MCG tablet Take 25 mcg by mouth daily.       . midodrine (PROAMATINE) 5 MG tablet Take 10 mg by mouth 2 (two) times daily with a meal. On dialysis days. Monday,wednesday and friday      . multivitamin (RENA-VIT)  TABS tablet Take 1 tablet by mouth at bedtime.  30 tablet  0  . nitroGLYCERIN (NITROSTAT) 0.4 MG SL tablet Place 0.4 mg under the tongue every 5 (five) minutes as needed for chest pain. For chest pain.      . pantoprazole (PROTONIX) 40 MG tablet Take 40 mg by mouth daily.       . Probiotic Product (PROBIOTIC PO) Take 1 capsule by mouth daily as needed (digestion).       . simvastatin (ZOCOR) 80 MG tablet Take 1 tablet (80 mg total) by mouth every evening.  30 tablet  0  . tiotropium (SPIRIVA) 18 MCG inhalation capsule Place 1 capsule (18 mcg total) into inhaler and inhale daily.  30 capsule  12   Physical exam Height 5' 4.96" (1.65 m), weight 141 lb 12.1 oz (64.3 kg). General: lying in bed, intubated, arousable, responding to commands Lungs: coarse b/l breath sounds CV: RRR  Abdomen: Soft, obese, +bs,  nontender Skin: +left heel ulcer Neurologic: arousable to voice, follows commands Extremities: moving all extremities, -edema  Labs:   Lab Results  Component Value Date   WBC 10.7* 03/11/2014   HGB 13.6 03/11/2014   HCT 40.0 03/11/2014   MCV 95.2 03/11/2014   PLT 250 03/11/2014    Recent Labs Lab 03/11/14 2225 03/11/14 2237  NA 133* 135*  K 3.8 3.7  CL 93* 94*  CO2 27  --   BUN 26* 26*  CREATININE 2.57* 2.70*  CALCIUM 9.2  --   PROT 6.9  --   BILITOT 0.3  --   ALKPHOS 164*  --   ALT 28  --   AST 29  --   GLUCOSE 147* 149*   Lab Results  Component Value Date   CKTOTAL 42 02/07/2012   CKMB 4.5* 02/07/2012   TROPONINI <0.30 03/02/2014    Lab Results  Component Value Date   CHOL 118 02/28/2014   CHOL 208* 11/10/2013   CHOL 205* 02/10/2012   Lab Results  Component Value Date   HDL 59 02/28/2014   HDL 75 2/5/36644/07/2012   Lab Results  Component Value Date   LDLCALC 37 02/28/2014   LDLCALC 110* 02/10/2012   Lab Results  Component Value Date   TRIG 112 02/28/2014   TRIG 102 02/10/2012   Lab Results  Component Value Date   CHOLHDL 2.0 02/28/2014   CHOLHDL 2.7 02/10/2012   No results found for this basename: LDLDIRECT    Radiology:Moderate interstitial edema, superimposed PNA in LLL.  EKG:96bpm, NSR, ST depressions in inferior anterolateral leads seen on prior ECG  ASSESSMENT AND PLAN:   Ms. [name] [name] is a 73 year old female with PMH notable for ESRD on HD, DM2, extensive cardiac history with recent NSTEMI and cardiac arrest, [facility] s/p PEA arrest.   S/p PEA arrest--unclear etiology.  initially transferred to CCU from MICU for hypothermia protocol, however, patient now easily arousable and responding to commands with stable vs. Hx of bradycardia in the past along with CAD notable for cath in 2013 with failed PCI to RCA.  -monitor closely on tele -cycle cardiac enzymes -heparin gtt for now, but would stop if troponin negative -hold coreg -start asa and statin -repeat echo, last  one 02/2014: moderately to severely reduced systolic function with EF 30-35% and severe hypokinesis -agree with CTA chest for r/o PE  Acute on chronic respiratory failure in setting of PEA arrest. Of note, was recently seen in ED for hypoxemia on 03/11/14.  On chronic [place] O2 at home ~3L. ABG today 7.38/48.8/54/29.4/87%. PCCM primary.  -intubated, on mechanical ventilation -CTA pending -on fentanyl -versed prn  Acute on chronic systolic HF with EF 30-35% per recent echo.  Edema on CXR and noted to be on ~3L [place] O2 at home. Was to get HD today but had PEA arrest prior to session. Renal has been called by primary team.  -repeat echo -will need HD   DM2--last A1C 7.5 -SSI sensitive for now  HTN--home medications include norvasc, coreg 3.125mg  bid, and IMDUR 15mg  qd.  -holding all anti-hypertensive's in setting of PEA arrest for now  Signed: Darden Palmer, MD PGY-2, Internal Medicine Resident Pager: [phone]  03/13/2014,2:40 PM  Patient seen with resident, agree with the above note.  1. PEA arrest: We do not have telemetry strips of her rhythm.  She was on her way to HD.  She epinephrine and bicarbonate and recovered a pulse.  She was intubated.  She follows commands and is awake, so she was not cooled.  I am uncertain of the cause of the PEA arrest.  CXR shows pulmonary edema and pleural effusions, so this may have been a respiratory arrest (hypoxia).  I think probably less likely bradycardic arrest.  - Hold Coreg for now and follow rhythm on telemetry.  - Plan for CTA chest to rule out PE.  - Await troponin.  If troponin positive, will need to decide on cardiac cath (could be ACS or could be demand ischemia from hypotension/hypoxemia).  ECG shows anterolateral 1 mm ST depression, but this has been seen on prior ECGs and is unchanged.  If troponin negative, can stop heparin and will follow rhythm.  She should be on aspirin.  - Repeat echo for LV/RV function.  2. Acute on chronic systolic CHF:  Pulmonary edema on CXR.  As above, possible PEA from respiratory causes (hypoxemia).  EF 30-35% on prior echo.  Will need HD for fluid removal.  3. CAD: S/p cath and unsuccessful PCI attempt on the RCA March 2013. At that time she had chronic occlusion of the RCA, 90% (small) OM2, and the LAD 40% proximal, 99% distal. Her EF has been 30-35%.  Will treat with statin and aspirin, heparin gtt for now.  If troponin is elevated, will need to decide on cardiac cath.   Laurey Morale 03/13/2014 2:24 PM

## 2014-03-13 NOTE — Progress Notes (Signed)
Central venous line and OG tube placement verified via xray and ordered ok to use by physician.

## 2014-03-13 NOTE — Progress Notes (Signed)
ANTICOAGULATION CONSULT NOTE - Initial Consult  Pharmacy Consult for heparin Indication: Rule out PE  Allergies  Allergen Reactions  . Procaine Hcl Nausea And Vomiting and Other (See Comments)    Syncope/[facility] has reaction of "comatose"    Patient Measurements: Height: 5' 4.96" (165 cm) Weight: 141 lb 12.1 oz (64.3 kg) IBW/kg (Calculated) : 56.91 Heparin Dosing Weight: 64  Vital Signs: BP: 148/46 mmHg (05/11 1345)  Labs:  Recent Labs  03/11/14 2225 03/11/14 2237  HGB 11.6* 13.6  HCT 37.8 40.0  PLT 250  --   CREATININE 2.57* 2.70*    Estimated Creatinine Clearance: 17.4 ml/min (by C-G formula based on Cr of 2.7).   Medical History: Past Medical History  Diagnosis Date  . ESRD on hemodialysis     Started HD in December 2013.  Cause of ESRD unknown.  Gets HD in [place] on MWF schedule. She had a right upper arm access which failed just short of one year.  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  She says she has "poor circulation" in the legs so they won't do a leg access. She remains catheter dependent as of   . Hypothyroidism   . Colitis 10/2011    C diff.   . Coronary artery disease 3/13    3V CAD at cath, failed RCA PCI  . Hypertension   . GERD (gastroesophageal reflux disease)   . Myocardial infarction   . Peripheral vascular disease     claudication.   Marland Kitchen Hx of pulmonary embolus   . COPD (chronic obstructive pulmonary disease)     Nocturnal O2.   . Shortness of breath     uses O2 as needed  . Critical lower limb ischemia   . DVT (deep venous thrombosis) 2008    "right"  . Pneumonia     "several times"  . Type II diabetes mellitus     insulin requiring.   . CHF (congestive heart failure)     Volume mgt with HD  . Hypercholesteremia   . GI bleed March 2015   Assessment: 73 year old female admitted post PEA arrest on way to dialysis center. Concern for PE,  will start IV heparin. Baseline coags ordered, patient appears to have been taken off warfarin a couple of years ago.  Goal of Therapy:  Heparin level 0.3-0.7 units/ml Monitor platelets by anticoagulation protocol: Yes   Plan:  Give 3500 units bolus x 1 Start heparin infusion at 1100 units/hr Check anti-Xa level in 8 hours and daily while on heparin Continue to monitor H&H and platelets  Sheppard Coil PharmD., BCPS Clinical Pharmacist Pager [phone] 03/13/2014 2:40 PM

## 2014-03-13 NOTE — Procedures (Signed)
Arterial Catheter Insertion Procedure Note [name] [id number] 06/09/1941  Procedure: Insertion of Arterial Catheter  Indications: Blood pressure monitoring and Frequent blood sampling  Procedure Details Consent: Unable to obtain consent because of altered level of consciousness. Time Out: Verified patient identification, verified procedure, site/side was marked, verified correct patient position, special equipment/implants available, medications/allergies/relevent history reviewed, required imaging and test results available.  Performed  Maximum sterile technique was used including antiseptics, cap, gloves, gown, hand hygiene, mask and sheet. Skin prep: Chlorhexidine; local anesthetic administered 20 gauge catheter was inserted into left radial artery using the Seldinger technique.  Evaluation Blood flow good; BP tracing good. Complications: No apparent complications.   [name] 03/13/2014

## 2014-03-13 NOTE — Progress Notes (Signed)
RT note- PEEP increased post ABG done in MICU. CO-OX drawn at this time too.

## 2014-03-13 NOTE — Procedures (Signed)
Central Venous Catheter Insertion Procedure Note [name] [name] [id number] [birth year]  Procedure: Insertion of Central Venous Catheter Indications: Assessment of intravascular volume, Drug and/or fluid administration and Frequent blood sampling  Procedure Details Consent: Unable to obtain consent because of emergent medical necessity. Time Out: Verified patient identification, verified procedure, site/side was marked, verified correct patient position, special equipment/implants available, medications/allergies/relevent history reviewed, required imaging and test results available.  Performed  Maximum sterile technique was used including antiseptics, cap, gloves, gown, hand hygiene, mask and sheet. Skin prep: Chlorhexidine; local anesthetic administered A antimicrobial bonded/coated triple lumen catheter was placed in the left subclavian vein using the Seldinger technique.  Evaluation Blood flow good Complications: No apparent complications Patient did tolerate procedure well. Chest X-ray ordered to verify placement.  CXR: pending.  U/S used in placement.  [name] [name] 03/13/2014, 3:47 PM

## 2014-03-13 NOTE — Progress Notes (Signed)
Advanced ETT 3CM 23cm at the lip

## 2014-03-13 NOTE — Progress Notes (Signed)
Transported pt to CT on Ventilator

## 2014-03-14 ENCOUNTER — Inpatient Hospital Stay (HOSPITAL_COMMUNITY): Payer: Medicare Other

## 2014-03-14 DIAGNOSIS — E119 Type 2 diabetes mellitus without complications: Secondary | ICD-10-CM

## 2014-03-14 DIAGNOSIS — I2589 Other forms of chronic ischemic heart disease: Secondary | ICD-10-CM

## 2014-03-14 LAB — BASIC METABOLIC PANEL
BUN: 44 mg/dL — ABNORMAL HIGH (ref 6–23)
CO2: 24 meq/L (ref 19–32)
Calcium: 8.2 mg/dL — ABNORMAL LOW (ref 8.4–10.5)
Chloride: 97 mEq/L (ref 96–112)
Creatinine, Ser: 3.7 mg/dL — ABNORMAL HIGH (ref 0.50–1.10)
GFR, EST AFRICAN AMERICAN: 13 mL/min — AB (ref >90)
GFR, EST NON AFRICAN AMERICAN: 11 mL/min — AB (ref >90)
Glucose, Bld: 82 mg/dL (ref 70–99)
Potassium: 4.2 mEq/L (ref 3.7–5.3)
SODIUM: 137 meq/L (ref 137–147)

## 2014-03-14 LAB — BLOOD GAS, ARTERIAL
Acid-Base Excess: 2.7 mmol/L — ABNORMAL HIGH (ref 0.0–2.0)
BICARBONATE: 26.2 meq/L — AB (ref 20.0–24.0)
Drawn by: 347621
FIO2: 0.6 %
MECHVT: 420 mL
O2 Saturation: 95.8 %
PCO2 ART: 37.2 mmHg (ref 35.0–45.0)
PEEP: 14 cmH2O
PH ART: 7.463 — AB (ref 7.350–7.450)
PO2 ART: 78.3 mmHg — AB (ref 80.0–100.0)
Patient temperature: 98.7
RATE: 30 resp/min
TCO2: 27.3 mmol/L (ref 0–100)

## 2014-03-14 LAB — GLUCOSE, CAPILLARY
GLUCOSE-CAPILLARY: 194 mg/dL — AB (ref 70–99)
GLUCOSE-CAPILLARY: 130 mg/dL — AB (ref 70–99)
Glucose-Capillary: 80 mg/dL (ref 70–99)
Glucose-Capillary: 95 mg/dL (ref 70–99)
Glucose-Capillary: 100 mg/dL — ABNORMAL HIGH (ref 70–99)
Glucose-Capillary: 68 mg/dL — ABNORMAL LOW (ref 70–99)
Glucose-Capillary: 75 mg/dL (ref 70–99)

## 2014-03-14 LAB — MAGNESIUM: Magnesium: 1.8 mg/dL (ref 1.5–2.5)

## 2014-03-14 LAB — TROPONIN I
Troponin I: <0.3 ng/mL (ref <0.30)
Troponin I: <0.3 ng/mL (ref <0.30)

## 2014-03-14 LAB — CBC
HCT: 32.2 % — ABNORMAL LOW (ref 36.0–46.0)
Hemoglobin: 10.3 g/dL — ABNORMAL LOW (ref 12.0–15.0)
MCH: 29.5 pg (ref 26.0–34.0)
MCHC: 32 g/dL (ref 30.0–36.0)
MCV: 92.3 fL (ref 78.0–100.0)
Platelets: 226 10*3/uL (ref 150–400)
RBC: 3.49 MIL/uL — AB (ref 3.87–5.11)
RDW: 17 % — ABNORMAL HIGH (ref 11.5–15.5)
WBC: 10.9 10*3/uL — ABNORMAL HIGH (ref 4.0–10.5)

## 2014-03-14 LAB — HEPARIN LEVEL (UNFRACTIONATED): HEPARIN UNFRACTIONATED: 0.93 [IU]/mL — AB (ref 0.30–0.70)

## 2014-03-14 LAB — PHOSPHORUS: PHOSPHORUS: 3.5 mg/dL (ref 2.3–4.6)

## 2014-03-14 MED ORDER — ASPIRIN 81 MG PO CHEW
81.0000 mg | CHEWABLE_TABLET | Freq: Every day | ORAL | Status: DC
  Administered 2014-03-14 – 2014-03-23 (×10): 81 mg via ORAL
  Filled 2014-03-14 (×8): qty 1

## 2014-03-14 MED ORDER — NEPRO/CARBSTEADY PO LIQD
1000.0000 mL | ORAL | Status: DC
  Administered 2014-03-14 – 2014-03-19 (×4): 1000 mL via ORAL
  Filled 2014-03-14 (×7): qty 1000

## 2014-03-14 MED ORDER — CARVEDILOL 3.125 MG PO TABS
3.1250 mg | ORAL_TABLET | Freq: Two times a day (BID) | ORAL | Status: DC
  Filled 2014-03-14 (×5): qty 1

## 2014-03-14 MED ORDER — MIDAZOLAM HCL 2 MG/2ML IJ SOLN
INTRAMUSCULAR | Status: AC
  Administered 2014-03-14: 2 mg
  Filled 2014-03-14: qty 2

## 2014-03-14 MED ORDER — MIDAZOLAM HCL 2 MG/2ML IJ SOLN
2.0000 mg | INTRAMUSCULAR | Status: DC | PRN
  Administered 2014-03-18 (×2): 2 mg via INTRAVENOUS
  Filled 2014-03-14 (×2): qty 2

## 2014-03-14 MED ORDER — HEPARIN SODIUM (PORCINE) 5000 UNIT/ML IJ SOLN
5000.0000 [IU] | Freq: Three times a day (TID) | INTRAMUSCULAR | Status: DC
  Administered 2014-03-14 – 2014-03-23 (×26): 5000 [IU] via SUBCUTANEOUS
  Filled 2014-03-14 (×30): qty 1

## 2014-03-14 MED ORDER — PRO-STAT SUGAR FREE PO LIQD
30.0000 mL | Freq: Two times a day (BID) | ORAL | Status: DC
  Administered 2014-03-14 – 2014-03-20 (×11): 30 mL
  Filled 2014-03-14 (×14): qty 30

## 2014-03-14 NOTE — Consult Note (Signed)
I have seen and examined this patient and agree with the plan of care   [name] 03/14/2014, 7:09 AM

## 2014-03-14 NOTE — Progress Notes (Signed)
INITIAL NUTRITION ASSESSMENT  DOCUMENTATION CODES Per approved criteria  -Not Applicable   INTERVENTION: Initiate Nepro with Carb Steady at 20 ml/hr and increase 10 ml q 4 hours to reach goal rate of 30 ml/hr and add 30 mL Prostate BID.  At goal rate TF regimen will provide 1496 kcal (99% of minimum estimated needs) 88 grams of protein (100% of minimum estimated needs), and 523 ml free water.   NUTRITION DIAGNOSIS: Inadequate oral intake related to inability to eat as evidenced by NPO status.   Goal: Meet >/= 90% of estimated nutrition needs   Monitor:  TF initiation/ tolerance, weight trend, labs  Reason for Assessment: Consult for TF management   73 y.o. female  Admitting Dx: Cardiac arrest  ASSESSMENT: Patient is a 73 yo with ESRD with HD via left ij tunneled diatek cath who [place] and had a PEA ARREST and was treated with EPI and CPR with ROSC. [place] [facility] and [facility]. Originally taken to MICU but will be transferred to CCU for hypothermia protocol.   RD consulted to initiate and manage tube feeding.   No family to provide history. Pt alert and following commands.   Nutrition focused physical exam showed no signs of depletion of muscle mass or body fat.   Patient currently intubated on ventilator support.  MV: 12.3  Temp (24hrs), Avg:98.9 F (37.2 C), Min:97.3 F (36.3 C), Max:100.4 F (38 C)   Height: Ht Readings from Last 1 Encounters:  03/13/14 5' 4.96" (1.65 m)    Weight: Wt Readings from Last 1 Encounters:  03/14/14 154 lb 8.7 oz (70.1 kg)   EDW: 64 kg   Ideal Body Weight: 56.8 kg   % Ideal Body Weight: 123%   Wt Readings from Last 10 Encounters:  03/14/14 154 lb 8.7 oz (70.1 kg)  03/04/14 141 lb 12.1 oz (64.3 kg)  03/04/14 141 lb 12.1 oz (64.3 kg)  03/01/14 141 lb 15.6 oz (64.4 kg)  01/07/14 143 lb 1.3 oz (64.9 kg)  01/07/14 143 lb 1.3 oz (64.9 kg)   12/30/13 138 lb 0.1 oz (62.6 kg)  12/07/13 145 lb 11.2 oz (66.089 kg)  11/12/13 144 lb 8 oz (65.545 kg)  10/30/13 148 lb 13 oz (67.5 kg)    Usual Body Weight: 140 - 150 lbs   % Usual Body Weight: 106%   BMI:  Body mass index is 25.75 kg/(m^2)., Overweight   Estimated Nutritional Needs: Kcal: 1500 Protein: 77 - 90 grams  Fluid: >/= 1.5 L/ day   Skin: L heel diabetic ulcer   Diet Order: NPO  EDUCATION NEEDS: -Education not appropriate at this time   Intake/Output Summary (Last 24 hours) at 03/14/14 1425 Last data filed at 03/14/14 1320  Gross per 24 hour  Intake 706.01 ml  Output   2415 ml  Net -1708.99 ml    Last BM: PTA   Labs:   Recent Labs Lab 03/11/14 2225 03/11/14 2237 03/13/14 1416 03/14/14 0450  NA 133* 135* 137 137  K 3.8 3.7 4.2 4.2  CL 93* 94* 94* 97  CO2 27  --  25 24  BUN 26* 26* 39* 44*  CREATININE 2.57* 2.70* 2.95* 3.70*  CALCIUM 9.2  --  8.1* 8.2*  MG  --   --   --  1.8  PHOS  --   --   --  3.5  GLUCOSE 147* 149* 192*  82    CBG (last 3)   Recent Labs  03/14/14 0456 03/14/14 0730 03/14/14 1118  GLUCAP 80 95 100*    Scheduled Meds: . antiseptic oral rinse  15 mL Mouth Rinse QID  . aspirin  81 mg Oral Daily  . atorvastatin  40 mg Per Tube q1800  . carvedilol  3.125 mg Oral BID WC  . chlorhexidine  15 mL Mouth Rinse BID  . heparin subcutaneous  5,000 Units Subcutaneous 3 times per day  . insulin aspart  0-9 Units Subcutaneous 6 times per day  . mupirocin cream   Topical Daily    Continuous Infusions: . sodium chloride 10 mL/hr at 03/14/14 0700  . fentaNYL infusion INTRAVENOUS 100 mcg/hr (03/14/14 1100)  . norepinephrine (LEVOPHED) Adult infusion 5 mcg/min (03/14/14 1125)    Past Medical History  Diagnosis Date  . ESRD on hemodialysis     Started HD in December 2013.  Cause of ESRD unknown.  Gets HD in [place] on MWF schedule. She had a right upper arm access which failed just short of one year.  A left arm access was  attempted but had to be ligated for steal syndrome in left hand after being in for about 2 months.  She says she has "poor circulation" in the legs so they won't do a leg access. She remains catheter dependent as of   . Hypothyroidism   . Colitis 10/2011    C diff.   . Coronary artery disease 3/13    3V CAD at cath, failed RCA PCI  . Hypertension   . GERD (gastroesophageal reflux disease)   . Myocardial infarction   . Peripheral vascular disease     claudication.   Marland Kitchen Hx of pulmonary embolus   . COPD (chronic obstructive pulmonary disease)     Nocturnal O2.   . Shortness of breath     uses O2 as needed  . Critical lower limb ischemia   . DVT (deep venous thrombosis) 2008    "right"  . Pneumonia     "several times"  . Type II diabetes mellitus     insulin requiring.   . CHF (congestive heart failure)     Volume mgt with HD  . Hypercholesteremia   . GI bleed March 2015    Past Surgical History  Procedure Laterality Date  . Tubal ligation    . Toe amputation Right     5th toe  . Tonsillectomy    . Insertion of dialysis catheter Left     chest  . Av fistula placement Right [id number]    RUE  . Bascilic vein transposition  May 18, 2012    First stage  . Bascilic vein transposition Left 07/27/12    Basilic Vein Transposition  . Arch aortogram  08/19/2012    Procedure: ARCH AORTOGRAM;  Surgeon: Fransisco Hertz, MD;  [facility];  Service: Vascular;  Laterality: N/A;  . Insertion of dialysis catheter  08/21/2012    Procedure: INSERTION OF DIALYSIS CATHETER;  Surgeon: Sherren Kerns, MD;  Location: [facility] OR;  Service: Vascular;  Laterality: Left;  internal jugular  . Cardiac catheterization  02/04/2012    LAD 40/99%, CRX OK, OM1 90% (small), RCA 90%/subtotal, failed PCI. Good L-R collaterals.  . Esophagogastroduodenoscopy N/A 01/06/2014    Procedure: ESOPHAGOGASTRODUODENOSCOPY (EGD);  Surgeon: Hilarie Fredrickson, MD;  Location: [facility] ENDOSCOPY;  Service: Endoscopy;  Laterality: N/A;     Eppie Gibson, BS Dietetic Intern Pager: [phone]

## 2014-03-14 NOTE — Clinical Documentation Improvement (Signed)
Possible Clinical Conditions?  Encephalopathy (describe type if known)                       Anoxic                       Septic                       Alcoholic                        Hepatic                       Metabolic                       Toxic Other Condition Cannot Clinically Determine   Supporting Information: (As per notes) "A: Decreased LOC post PEA arrest."  Treatment: "- Minimize sedation. - Monitor mental status. "  Thank You, Nevin Bloodgood, RN, BSN, CCDS, Clinical Documentation Specialist:  [phone]   [phone]=[employer] [place]- Health Information Management

## 2014-03-14 NOTE — Progress Notes (Signed)
SUBJECTIVE:[name] [name] was seen and examined at bedside this morning.  She is awake and alert, remains on vent with low sedation. HD today.   Filed Vitals:   03/14/14 0338 03/14/14 0400 03/14/14 0500 03/14/14 0600  BP: 95/47     Pulse: 89 89 92 90  Temp:      TempSrc:      Resp: 30 30 30 30   Height:      Weight:   153 lb 10.6 oz (69.7 kg)   SpO2: 98% 96% 95% 96%    Intake/Output Summary (Last 24 hours) at 03/14/14 0727 Last data filed at 03/14/14 0600  Gross per 24 hour  Intake 517.02 ml  Output    715 ml  Net -197.98 ml   LABS: Basic Metabolic Panel:  Recent Labs  16/10/96 1416 03/14/14 0450  NA 137 137  K 4.2 4.2  CL 94* 97  CO2 25 24  GLUCOSE 192* 82  BUN 39* 44*  CREATININE 2.95* 3.70*  CALCIUM 8.1* 8.2*  MG  --  1.8  PHOS  --  3.5   Liver Function Tests:  Recent Labs  03/11/14 2225  AST 29  ALT 28  ALKPHOS 164*  BILITOT 0.3  PROT 6.9  ALBUMIN 3.4*   CBC:  Recent Labs  03/11/14 2225 03/11/14 2237 03/14/14 0450  WBC 10.7*  --  10.9*  NEUTROABS 8.5*  --   --   HGB 11.6* 13.6 10.3*  HCT 37.8 40.0 32.2*  MCV 95.2  --  92.3  PLT 250  --  226   Cardiac Enzymes:  Recent Labs  03/13/14 1416 03/13/14 2300 03/14/14 0400  TROPONINI <0.30 <0.30 <0.30   RADIOLOGY: Dg Chest 2 View  03/02/2014   CLINICAL DATA:  Weakness, shortness of breath  EXAM: CHEST  2 VIEW  COMPARISON:  Prior radiograph from 02/28/2014  FINDINGS: Left-sided hemodialysis catheter again noted. Cardiomegaly is stable. Mediastinal silhouette within normal limits.  Veiling opacity overlying the left hemidiaphragm is compatible with moderate left pleural effusion. A smaller right pleural effusion is likely present as well. There is mild central perihilar vascular congestion without overt pulmonary edema. No focal infiltrate identified. There is no pneumothorax.  Osseous structures are unchanged.  IMPRESSION: 1. Moderate bilateral pleural effusions, left greater than right. 2. Stable  cardiomegaly without overt pulmonary edema.   Electronically Signed   By: Rise Mu M.D.   On: 03/02/2014 00:20   Dg Chest 2 View  02/28/2014   CLINICAL DATA:  Over clip chest pain, shortness of breath, history hypertension, CHF, diabetes, COPD, former smoker, end-stage renal disease on dialysis  EXAM: CHEST  2 VIEW  COMPARISON:  02/25/2014  FINDINGS: RIGHT jugular dual-lumen central venous catheter with distal tip projecting over the RIGHT atrium.  Enlargement of cardiac silhouette with pulmonary vascular congestion.  Atherosclerotic calcifications aorta.  Bibasilar effusions and atelectasis.  Peribronchial thickening with improved perihilar edema since previous exam.  No pneumothorax.  Bones demineralized.  IMPRESSION: Enlargement of cardiac silhouette with pulmonary vascular congestion.  Improved pulmonary edema with persistent bibasilar effusions and atelectasis.   Electronically Signed   By: Ulyses Southward M.D.   On: 02/28/2014 15:29   Ct Angio Chest Pe W/cm &/or Wo Cm  03/13/2014   CLINICAL DATA:  Decreased oxygen saturation  EXAM: CT ANGIOGRAPHY CHEST WITH CONTRAST  TECHNIQUE: Multidetector CT imaging of the chest was performed using the standard protocol during bolus administration of intravenous contrast. Multiplanar CT image reconstructions and MIPs were  obtained to evaluate the vascular anatomy.  CONTRAST:  77mL OMNIPAQUE IOHEXOL 350 MG/ML SOLN  COMPARISON:  11/08/2013  FINDINGS: An endotracheal tube, nasogastric catheter and left-sided dialysis catheter are again noted. The remains are narrowing of the right innominate vein appear in these changes are stable from the prior exam. A right-sided central venous line is noted as well.  Large bilateral pleural effusions are noted with lower lobe consolidation noted. These changes of increased in the interval from the prior exam. No focal pulmonary nodule is identified. Minimal patchy the upper lobe atelectasis is noted on right.  The thoracic  aorta is well visualized and heavily calcified. No aneurysmal dilatation or dissection is noted. The origins of the great vessels are patent however some stenosis is noted in the left subclavian artery beyond its origin secondary to both calcified and noncalcified plaque. Pulmonary artery is well visualized and demonstrates a normal branching pattern. No filling defects to suggest pulmonary emboli are identified. No significant hilar or mediastinal adenopathy is noted. Scattered small mediastinal lymph nodes are seen. In retrospect these are stable from the prior exam.  Scanning into the upper abdomen reveals very mild ascites. The osseous structures show no acute abnormality.  Review of the MIP images confirms the above findings.  IMPRESSION: No evidence of pulmonary emboli.  Bilateral pleural effusions with lower lobe consolidation. This is increased in the interval from the prior exam.  Tubes and lines as described.  Chronic changes as described.   Electronically Signed   By: Alcide CleverMark  Lukens M.D.   On: 03/13/2014 18:42   Dg Chest Port 1 View  03/13/2014   CLINICAL DATA:  Central line placement.  EXAM: PORTABLE CHEST - 1 VIEW  COMPARISON:  03/13/2014  FINDINGS: Right subclavian central line has been placed with the tip in the SVC. Left dialysis catheter, endotracheal tube and NG tube are unchanged. No pneumothorax. Bilateral lower lobe airspace opacities and effusions are again noted, stable. Heart is normal size.  IMPRESSION: New right subclavian central line in place with the tip in the SVC. No pneumothorax. Otherwise no significant change.   Electronically Signed   By: Charlett NoseKevin  Dover M.D.   On: 03/13/2014 16:33   Dg Chest Port 1 View  03/13/2014   CLINICAL DATA:  Hypoxia  EXAM: PORTABLE CHEST - 1 VIEW  COMPARISON:  Study obtained earlier in the day  FINDINGS: Endotracheal tube tip is 3.9 cm above the carina. Central catheter tip is essentially at the cavoatrial junction. There is no a nasogastric tube with the  tip and side-port in the stomach. No pneumothorax. There is consolidation in the left lower lobe with left effusion. There is more patchy infiltrate in the right lower lobe. There is moderate interstitial edema. Heart is mildly enlarged with pulmonary venous hypertension. No adenopathy. There is atherosclerotic change in the aorta.  IMPRESSION: Congestive heart failure. Question superimposed pneumonia left lower lobe. Tube and catheter positions as described without apparent pneumothorax.   Electronically Signed   By: Bretta BangWilliam  Woodruff M.D.   On: 03/13/2014 14:14   Dg Chest Port 1 View  03/11/2014   CLINICAL DATA:  Shortness of breath  EXAM: PORTABLE CHEST - 1 VIEW  COMPARISON:  03/01/2014  FINDINGS: Left IJ dialysis catheter, tip at the level of the upper right atrium.  Stable mild cardiomegaly. Widening of the upper mediastinum related to apical lordotic positioning. Small to moderate bilateral pleural effusions. These are likely stable from prior given differences in patient positioning, with inferior flow  of fluid on the right. Venous congestion with diffuse interstitial coarsening and cephalized blood flow, increased from previous.  IMPRESSION: 1. Small to moderate bilateral pleural effusions. 2. Pulmonary venous hypertension.   Electronically Signed   By: Tiburcio Pea M.D.   On: 03/11/2014 22:35   Dg Chest Port 1 View  02/25/2014   CLINICAL DATA:  Respiratory failure  EXAM: PORTABLE CHEST - 1 VIEW  COMPARISON:  Prior chest x-ray 02/24/2014  FINDINGS: Stable cardiomegaly. Left IJ approach split tip hemodialysis catheter with catheter tips at the superior cavoatrial junction and upper right atrium. Atherosclerotic calcifications in the transverse aorta. Increased pulmonary edema. Enlarging bilateral layering pleural effusions and associated bibasilar atelectasis. No acute osseous abnormality. No pneumothorax.  IMPRESSION: Developing pulmonary edema and enlarging bilateral layering effusions. Differential  considerations include CHF and volume overload.   Electronically Signed   By: Malachy Moan M.D.   On: 02/25/2014 09:16   Dg Chest Port 1 View  02/24/2014   CLINICAL DATA:  post-arrest  EXAM: PORTABLE CHEST - 1 VIEW  COMPARISON:  DG CHEST 1V PORT dated 01/04/2014  FINDINGS: The cardiac silhouette is enlarged. There is diffuse prominence of interstitial markings, peribronchial cuffing, indistinctness of pulmonary vasculature. Areas of increased density in the lung bases. Small bilateral effusions left greater than right. No acute osseous abnormalities.  IMPRESSION: Pulmonary edema and small bilateral effusions. Areas of increased density within the lung bases consistent with areas of asymmetric edema versus atelectasis.   Electronically Signed   By: Salome Holmes M.D.   On: 02/24/2014 08:12   Dg Abd Portable 1v  02/24/2014   CLINICAL DATA:  confirm R fem line  EXAM: PORTABLE ABDOMEN - 1 VIEW  COMPARISON:  DG LUMBAR SPINE COMPLETE 4+V dated 01/16/2014; CT ABD/PELV WO CM dated 03/09/2013  FINDINGS: The bowel gas pattern is normal. Coarse vascular calcifications appreciated. Tip of a right femoral central venous catheter is appreciated projecting in the region of the proximal femoral vein on the right. No acute osseous abnormalities.  IMPRESSION: Nonobstructive bowel gas pattern. Right femoral vein central venous catheter appreciated.   Electronically Signed   By: Salome Holmes M.D.   On: 02/24/2014 08:11   Dg Foot Complete Left  02/24/2014   CLINICAL DATA:  Left heel wound.  EXAM: LEFT FOOT - COMPLETE 3+ VIEW  COMPARISON:  Os calcis 04/19/2013.  FINDINGS: Skin wound is seen along the lateral margin of the calcaneus. No bony destructive change is seen although the calcaneus is not well visualized on this exam. Pes cavus deformity is noted.  IMPRESSION: Soft tissue wound lateral calcaneus. No plain film evidence of osteomyelitis although the calcaneus is poorly visualized.  Pes cavus.   Electronically Signed    By: Drusilla Kanner M.D.   On: 02/24/2014 20:42   PHYSICAL EXAM General: lying in bed, agitated with ETT tube, on mechanical ventilation Lungs: coarse b/l breath sounds CV: RRR  Abdomen: Soft, nontender, +bs Neurologic:alert and awake, responding to commands Extremities: moving all 4 extremities  ASSESSMENT AND PLAN: Ms. Zentz is a 73 year old female with PMH notable for ESRD on HD, DM2, extensive cardiac history with recent NSTEMI and cardiac arrest, transferred to [facility] s/p PEA arrest.   S/p PEA arrest--unclear etiology. Hx of bradycardia in the past along with CAD notable for cath in 2013 with failed PCI to RCA.  NOT cooled. Cardiac enzymes x3 negative and CTA neg for PE. Last echo 02/2014: 02/2014: moderately to severely reduced systolic function with EF  30-35% and severe hypokinesis  -monitor closely on tele  -d/c heparin gtt  -holding coreg  -continue asa and statin  -echo pending today  Acute on chronic respiratory failure in setting of PEA arrest. Of note, was recently seen in ED for hypoxemia on 03/11/14. On chronic [place] O2 at home ~3L. ABG today 7.38/48.8/54/29.4/87%. PCCM primary. CTA neg for PE, b/l pleural effusions with lower lobe consolidation.  -intubated, on mechanical ventilation  -on fentanyl  -versed prn   Acute on chronic systolic HF with EF 30-35% per recent echo. Edema on CXR and noted to be on ~3L  O2 at home. Was to get HD today but had PEA arrest prior to session. Renal has been called by primary team.  -repeat echo  -HD today   DM2--last A1C 7.5  -SSI sensitive for now   HTN--home medications include norvasc, coreg 3.125mg  bid, Imdur.   -holding all anti-hypertensive's in setting of PEA arrest for now, may restart coreg?  ESRD on HD--MWF, missed yesterday's session. To resume today. -Renal following -HD today  Case discussed and patient seen with Dr. Shirlee Latch  Signed: Darden Palmer, MD PGY-2, Internal Medicine Resident Pager: [phone]  03/14/2014,7:33  AM  Patient seen with resident, agree with the above note.  She is awake and alert on vent today.  CXR with pulmonary edema and bilateral effusions.  Will need HD today for fluid removal followed hopefully by extubation.  I think that this was primarily a respiratory arrest/hypoxemia with pulmonary edema causing PEA arrest.  This will be controlled by fluid removal at HD.  Cardiac enzymes negative and CTA chest without PE.  Can restart low dose Coreg today, continue ASA 81 and statin.  Does not need heparin gtt at this point.    We will sign off, no further cardiac workup at this time.  Call with questions.    Laurey Morale 03/14/2014 8:28 AM

## 2014-03-14 NOTE — Progress Notes (Signed)
RD performed assessment alongside dietetic intern.  Physical exam performed.  Discussed status with RN who reports pt to remain intubated overnight. Chart reviewed. Agree with all interventions and documentation of intern.  Appropriate changes have been made and discussed with dietetic intern.   Loyce Dys, MS RD LDN Clinical Inpatient Dietitian Pager: [phone] Weekend/After hours pager: [phone]

## 2014-03-14 NOTE — Progress Notes (Signed)
ANTICOAGULATION CONSULT NOTE - Follow Up Consult  Pharmacy Consult for heparin Indication: r/o PE  Labs:  Recent Labs  03/11/14 2225 03/11/14 2237 03/13/14 1255 03/13/14 1416 03/13/14 2300 03/14/14 0100  HGB 11.6* 13.6  --   --   --   --   HCT 37.8 40.0  --   --   --   --   PLT 250  --   --   --   --   --   APTT  --   --  34  --   --   --   LABPROT  --   --  13.7  --   --   --   INR  --   --  1.07  --   --   --   HEPARINUNFRC  --   --   --   --   --  0.93*  CREATININE 2.57* 2.70*  --  2.95*  --   --   TROPONINI  --   --   --  <0.30 <0.30  --     Assessment: 72yo female supratherapeutic on heparin with initial dosing s/p PEA arrest w/ concern for PE.  Goal of Therapy:  Heparin level 0.3-0.7 units/ml   Plan:  Will decrease heparin gtt by 3 units/kg/hr to 900 units/hr and check level in 8hr.  Vernard Gambles, PharmD, BCPS  03/14/2014,3:09 AM

## 2014-03-14 NOTE — Progress Notes (Signed)
PULMONARY / CRITICAL CARE MEDICINE   Name: [name] [name] MRN: [id number] DOB: April 04, 1941    ADMISSION DATE:  03/13/2014   REFERRING MD : Duke Salvia ed PRIMARY SERVICE: PCCM  CHIEF COMPLAINT:  pea arrest  BRIEF PATIENT DESCRIPTION:  73 yo WF with ESRD with HD via left ij tunneled diatek cath who was on hr way to HD 5/11 in [facility] and had a PEA ARREST and was treated with EPI and CPR with ROSC. Taken to [facility] ED and intubated and then transferred to [facility]. Originally taken to MICU but will be transferred to CCU  For hypothermia protocol. She is intubated , not on pressor support currently. SIGNIFICANT EVENTS / STUDIES:  5/11 pea arrest  LINES / TUBES: 5/11 ott>> 5/11 left fem cvl>>5/11 5/11 R IJ TLC>>> 5/11 L radial a-line>>>  CULTURES: None  ANTIBIOTICS: None  SUBJECTIVE: No events overnight, much more alert and interactive.  VITAL SIGNS: Temp:  [98.5 F (36.9 C)-100.4 F (38 C)] 100.2 F (37.9 C) (05/12 0917) Pulse Rate:  [80-113] 98 (05/12 0735) Resp:  [23-30] 30 (05/12 0735) BP: (75-159)/(39-74) 105/57 mmHg (05/12 1030) SpO2:  [95 %-100 %] 96 % (05/12 0735) Arterial Line BP: (76-154)/(41-79) 90/46 mmHg (05/12 0900) FiO2 (%):  [60 %-100 %] 60 % (05/12 0736) Weight:  [141 lb 12.1 oz (64.3 kg)-158 lb 11.7 oz (72 kg)] 158 lb 11.7 oz (72 kg) (05/12 0917) HEMODYNAMICS:   VENTILATOR SETTINGS: Vent Mode:  [-] PRVC FiO2 (%):  [60 %-100 %] 60 % Set Rate:  [30 bmp] 30 bmp Vt Set:  [420 mL] 420 mL PEEP:  [5 cmH20-14 cmH20] 12 cmH20 Plateau Pressure:  [24 cmH20-30 cmH20] 24 cmH20 INTAKE / OUTPUT: Intake/Output     05/11 0701 - 05/12 0700 05/12 0701 - 05/13 0700   I.V. (mL/kg) 451 (6.5) 45 (0.6)   NG/GT 90    Total Intake(mL/kg) 541 (7.8) 45 (0.6)   Urine (mL/kg/hr) 65    Emesis/NG output 650    Total Output 715     Net -174 +45         PHYSICAL EXAMINATION: General: Chronically ill appearing female, arousable and following commands. Neuro:  Moves all ext to command. HEENT: OGT. OTT, No JVD Cardiovascular: HSD RRR Lungs: Coarse BS diffusely. Abdomen:  Soft, NT, ND and +BS. Musculoskeletal:  Intact Skin: cool  LABS:  CBC  Recent Labs Lab 03/11/14 2225 03/11/14 2237 03/14/14 0450  WBC 10.7*  --  10.9*  HGB 11.6* 13.6 10.3*  HCT 37.8 40.0 32.2*  PLT 250  --  226   Coag's  Recent Labs Lab 03/13/14 1255  APTT 34  INR 1.07   BMET  Recent Labs Lab 03/11/14 2225 03/11/14 2237 03/13/14 1416 03/14/14 0450  NA 133* 135* 137 137  K 3.8 3.7 4.2 4.2  CL 93* 94* 94* 97  CO2 27  --  25 24  BUN 26* 26* 39* 44*  CREATININE 2.57* 2.70* 2.95* 3.70*  GLUCOSE 147* 149* 192* 82   Electrolytes  Recent Labs Lab 03/11/14 2225 03/13/14 1416 03/14/14 0450  CALCIUM 9.2 8.1* 8.2*  MG  --   --  1.8  PHOS  --   --  3.5   Sepsis Markers  Recent Labs Lab 03/11/14 2237  LATICACIDVEN 0.49*   ABG  Recent Labs Lab 03/13/14 1315 03/13/14 1540 03/14/14 0344  PHART 7.388 7.407 7.463*  PCO2ART 48.8* 49.5* 37.2  PO2ART 54.0* 64.0* 78.3*   Liver Enzymes  Recent Labs  Lab 03/11/14 2225  AST 29  ALT 28  ALKPHOS 164*  BILITOT 0.3  ALBUMIN 3.4*   Cardiac Enzymes  Recent Labs Lab 03/13/14 1416 03/13/14 2300 03/14/14 0400  TROPONINI <0.30 <0.30 <0.30   Glucose  Recent Labs Lab 03/13/14 1404 03/13/14 1600 03/13/14 1937 03/13/14 2321 03/14/14 0456 03/14/14 0730  GLUCAP 182* 163* 103* 87 80 95    Imaging Ct Angio Chest Pe W/cm &/or Wo Cm  03/13/2014   CLINICAL DATA:  Decreased oxygen saturation  EXAM: CT ANGIOGRAPHY CHEST WITH CONTRAST  TECHNIQUE: Multidetector CT imaging of the chest was performed using the standard protocol during bolus administration of intravenous contrast. Multiplanar CT image reconstructions and MIPs were obtained to evaluate the vascular anatomy.  CONTRAST:  77mL OMNIPAQUE IOHEXOL 350 MG/ML SOLN  COMPARISON:  11/08/2013  FINDINGS: An endotracheal tube, nasogastric catheter  and left-sided dialysis catheter are again noted. The remains are narrowing of the right innominate vein appear in these changes are stable from the prior exam. A right-sided central venous line is noted as well.  Large bilateral pleural effusions are noted with lower lobe consolidation noted. These changes of increased in the interval from the prior exam. No focal pulmonary nodule is identified. Minimal patchy the upper lobe atelectasis is noted on right.  The thoracic aorta is well visualized and heavily calcified. No aneurysmal dilatation or dissection is noted. The origins of the great vessels are patent however some stenosis is noted in the left subclavian artery beyond its origin secondary to both calcified and noncalcified plaque. Pulmonary artery is well visualized and demonstrates a normal branching pattern. No filling defects to suggest pulmonary emboli are identified. No significant hilar or mediastinal adenopathy is noted. Scattered small mediastinal lymph nodes are seen. In retrospect these are stable from the prior exam.  Scanning into the upper abdomen reveals very mild ascites. The osseous structures show no acute abnormality.  Review of the MIP images confirms the above findings.  IMPRESSION: No evidence of pulmonary emboli.  Bilateral pleural effusions with lower lobe consolidation. This is increased in the interval from the prior exam.  Tubes and lines as described.  Chronic changes as described.   Electronically Signed   By: Alcide CleverMark  Lukens M.D.   On: 03/13/2014 18:42   Dg Chest Port 1 View  03/14/2014   CLINICAL DATA:  Hypoxia  EXAM: PORTABLE CHEST - 1 VIEW  COMPARISON:  Chest radiograph and chest CT Mar 13, 2014  FINDINGS: Endotracheal tube tip is 6.1 cm above the carina. The dual-lumen catheter tip is at the cavoatrial junction. The right central catheter tip is in the distal superior vena cava. No pneumothorax.  There are bilateral effusions with bibasilar consolidation. There is underlying  interstitial edema. Heart is upper normal in size with pulmonary venous hypertension.  IMPRESSION: Tube and catheter positions as described without appreciable pneumothorax. Underlying congestive heart failure. Superimposed pneumonia in the bases cannot be excluded on this study.   Electronically Signed   By: Bretta BangWilliam  Woodruff M.D.   On: 03/14/2014 07:36   Dg Chest Port 1 View  03/13/2014   CLINICAL DATA:  Central line placement.  EXAM: PORTABLE CHEST - 1 VIEW  COMPARISON:  03/13/2014  FINDINGS: Right subclavian central line has been placed with the tip in the SVC. Left dialysis catheter, endotracheal tube and NG tube are unchanged. No pneumothorax. Bilateral lower lobe airspace opacities and effusions are again noted, stable. Heart is normal size.  IMPRESSION: New right subclavian central line in  place with the tip in the SVC. No pneumothorax. Otherwise no significant change.   Electronically Signed   By: Charlett Nose M.D.   On: 03/13/2014 16:33   Dg Chest Port 1 View  03/13/2014   CLINICAL DATA:  Hypoxia  EXAM: PORTABLE CHEST - 1 VIEW  COMPARISON:  Study obtained earlier in the day  FINDINGS: Endotracheal tube tip is 3.9 cm above the carina. Central catheter tip is essentially at the cavoatrial junction. There is no a nasogastric tube with the tip and side-port in the stomach. No pneumothorax. There is consolidation in the left lower lobe with left effusion. There is more patchy infiltrate in the right lower lobe. There is moderate interstitial edema. Heart is mildly enlarged with pulmonary venous hypertension. No adenopathy. There is atherosclerotic change in the aorta.  IMPRESSION: Congestive heart failure. Question superimposed pneumonia left lower lobe. Tube and catheter positions as described without apparent pneumothorax.   Electronically Signed   By: Bretta Bang M.D.   On: 03/13/2014 14:14   ASSESSMENT / PLAN:  PULMONARY A: VDRF secondary to PEA arrest     COPD, severe hypoxemia P:   -  Vent bundle - BD as needed - PEEP down to 12 and FiO2 to 60% and titrate for sats. - No PE, will d/c heparin drip. - ABG and CXR in AM.  CARDIOVASCULAR A: Post PEA arrest     CAF     HTN     CAD     CHF P:  - Cards consult appreciated. - Pressors as needed.  RENAL A:  ESRD P:   - Renal consult called. - HD with as much volume off as possible. - Replace electrolytes as indicated.  GASTROINTESTINAL A:  GI protection P:   - PPI. - Consult nutrition for TF.  HEMATOLOGIC A:  No acute issue P:  - Follow CBC. - Transfuse if <8 given CAD.  INFECTIOUS A:  No indication for infection P:   - Monitor WBC. - Monitor fever curves.  ENDOCRINE A:  DM  P:   - SSI. - TF.  NEUROLOGIC A:  Decreased LOC post PEA arrest. CT head 5/11 neg P:   - Minimize sedation. - Monitor mental status. - No where near trials for extubation.  TODAY'S SUMMARY:  Responded to high PEEP, hopefully with HD today and removal of as much volume as possible will improve O2 demand.  Need to discuss GOC and EOL concerns if able to extubate.  I have personally obtained a history, examined the patient, evaluated laboratory and imaging results, formulated the assessment and plan and placed orders.  CRITICAL CARE: The patient is critically ill with multiple organ systems failure and requires high complexity decision making for assessment and support, frequent evaluation and titration of therapies, application of advanced monitoring technologies and extensive interpretation of multiple databases. Critical Care Time devoted to patient care services described in this note is 35 minutes.   [name], M.D. [facility] Pulmonary/Critical Care Medicine. Pager: [phone]. After hours pager: [phone].

## 2014-03-15 ENCOUNTER — Inpatient Hospital Stay (HOSPITAL_COMMUNITY): Payer: Medicare Other

## 2014-03-15 DIAGNOSIS — I519 Heart disease, unspecified: Secondary | ICD-10-CM

## 2014-03-15 LAB — GLUCOSE, CAPILLARY
GLUCOSE-CAPILLARY: 187 mg/dL — AB (ref 70–99)
GLUCOSE-CAPILLARY: 190 mg/dL — AB (ref 70–99)
GLUCOSE-CAPILLARY: 223 mg/dL — AB (ref 70–99)
Glucose-Capillary: 189 mg/dL — ABNORMAL HIGH (ref 70–99)
Glucose-Capillary: 194 mg/dL — ABNORMAL HIGH (ref 70–99)
Glucose-Capillary: 213 mg/dL — ABNORMAL HIGH (ref 70–99)

## 2014-03-15 LAB — BASIC METABOLIC PANEL
BUN: 29 mg/dL — AB (ref 6–23)
CALCIUM: 8.4 mg/dL (ref 8.4–10.5)
CHLORIDE: 95 meq/L — AB (ref 96–112)
CO2: 26 mEq/L (ref 19–32)
CREATININE: 2.66 mg/dL — AB (ref 0.50–1.10)
GFR calc non Af Amer: 17 mL/min — ABNORMAL LOW (ref >90)
GFR, EST AFRICAN AMERICAN: 20 mL/min — AB (ref >90)
Glucose, Bld: 243 mg/dL — ABNORMAL HIGH (ref 70–99)
Potassium: 3.4 mEq/L — ABNORMAL LOW (ref 3.7–5.3)
Sodium: 137 mEq/L (ref 137–147)

## 2014-03-15 LAB — BLOOD GAS, ARTERIAL
Acid-Base Excess: 2.9 mmol/L — ABNORMAL HIGH (ref 0.0–2.0)
BICARBONATE: 26.5 meq/L — AB (ref 20.0–24.0)
DRAWN BY: 39899
FIO2: 0.5 %
LHR: 30 {breaths}/min
MECHVT: 420 mL
O2 SAT: 90.5 %
PEEP/CPAP: 8 cmH2O
PH ART: 7.428 (ref 7.350–7.450)
Patient temperature: 101.6
TCO2: 27.7 mmol/L (ref 0–100)
pCO2 arterial: 41.7 mmHg (ref 35.0–45.0)
pO2, Arterial: 67.6 mmHg — ABNORMAL LOW (ref 80.0–100.0)

## 2014-03-15 LAB — CBC
HEMATOCRIT: 32.5 % — AB (ref 36.0–46.0)
Hemoglobin: 10.2 g/dL — ABNORMAL LOW (ref 12.0–15.0)
MCH: 29.4 pg (ref 26.0–34.0)
MCHC: 31.4 g/dL (ref 30.0–36.0)
MCV: 93.7 fL (ref 78.0–100.0)
PLATELETS: 257 10*3/uL (ref 150–400)
RBC: 3.47 MIL/uL — ABNORMAL LOW (ref 3.87–5.11)
RDW: 17.6 % — AB (ref 11.5–15.5)
WBC: 13.6 10*3/uL — ABNORMAL HIGH (ref 4.0–10.5)

## 2014-03-15 LAB — MAGNESIUM: MAGNESIUM: 1.7 mg/dL (ref 1.5–2.5)

## 2014-03-15 LAB — PHOSPHORUS: Phosphorus: 2.6 mg/dL (ref 2.3–4.6)

## 2014-03-15 MED ORDER — PANTOPRAZOLE SODIUM 40 MG IV SOLR
40.0000 mg | INTRAVENOUS | Status: DC
  Administered 2014-03-15 – 2014-03-16 (×2): 40 mg via INTRAVENOUS
  Filled 2014-03-15 (×3): qty 40

## 2014-03-15 MED ORDER — POTASSIUM CHLORIDE 10 MEQ/50ML IV SOLN: 10.0000 meq | INTRAVENOUS | Status: DC

## 2014-03-15 MED ORDER — ACETAMINOPHEN 160 MG/5ML PO SOLN
650.0000 mg | Freq: Four times a day (QID) | ORAL | Status: DC | PRN
  Administered 2014-03-15 – 2014-03-20 (×2): 650 mg
  Filled 2014-03-15 (×2): qty 20.3

## 2014-03-15 MED ORDER — LEVOTHYROXINE SODIUM 25 MCG PO TABS
25.0000 ug | ORAL_TABLET | Freq: Every day | ORAL | Status: DC
  Administered 2014-03-15 – 2014-03-23 (×9): 25 ug
  Filled 2014-03-15 (×11): qty 1

## 2014-03-15 NOTE — Progress Notes (Signed)
  Echocardiogram 2D Echocardiogram has been performed.  [name] [name] 03/15/2014, 8:27 AM

## 2014-03-15 NOTE — Progress Notes (Signed)
[facility] ROUNDING NOTE   Subjective:   Interval History: intubated but awake   Objective:  Vital signs in last 24 hours:  Temp:  [97.3 F (36.3 C)-101.6 F (38.7 C)] 98.2 F (36.8 C) (05/13 0800) Pulse Rate:  [49-107] 92 (05/13 1000) Resp:  [15-31] 30 (05/13 1000) BP: (40-179)/(26-82) 125/43 mmHg (05/13 1000) SpO2:  [86 %-100 %] 97 % (05/13 1000) Arterial Line BP: (50-186)/(28-86) 140/50 mmHg (05/13 1000) FiO2 (%):  [40 %-60 %] 50 % (05/13 0800) Weight:  [67.8 kg (149 lb 7.6 oz)-70.1 kg (154 lb 8.7 oz)] 67.8 kg (149 lb 7.6 oz) (05/13 0500)  Weight change: 7.7 kg (16 lb 15.6 oz) Filed Weights   03/14/14 0917 03/14/14 1320 03/15/14 0500  Weight: 72 kg (158 lb 11.7 oz) 70.1 kg (154 lb 8.7 oz) 67.8 kg (149 lb 7.6 oz)    Intake/Output: I/O last 3 completed shifts: In: 1557.9 [I.V.:1027.9; NG/GT:530] Out: 2410 [Urine:60; Emesis/NG output:650; Other:1700]   Intake/Output this shift:  Total I/O In: 306.9 [I.V.:76.9; NG/GT:230] Out: -   CVS- RRR RS- CTA ABD- BS present soft non-distended EXT- no edema   Basic Metabolic Panel:  Recent Labs Lab 03/11/14 2225 03/11/14 2237 03/13/14 1416 03/14/14 0450 03/15/14 0400  NA 133* 135* 137 137 137  K 3.8 3.7 4.2 4.2 3.4*  CL 93* 94* 94* 97 95*  CO2 27  --  25 24 26   GLUCOSE 147* 149* 192* 82 243*  BUN 26* 26* 39* 44* 29*  CREATININE 2.57* 2.70* 2.95* 3.70* 2.66*  CALCIUM 9.2  --  8.1* 8.2* 8.4  MG  --   --   --  1.8 1.7  PHOS  --   --   --  3.5 2.6    Liver Function Tests:  Recent Labs Lab 03/11/14 2225  AST 29  ALT 28  ALKPHOS 164*  BILITOT 0.3  PROT 6.9  ALBUMIN 3.4*   No results found for this basename: LIPASE, AMYLASE,  in the last 168 hours No results found for this basename: AMMONIA,  in the last 168 hours  CBC:  Recent Labs Lab 03/11/14 2225 03/11/14 2237 03/14/14 0450 03/15/14 0400  WBC 10.7*  --  10.9* 13.6*  NEUTROABS 8.5*  --   --   --   HGB 11.6* 13.6 10.3* 10.2*  HCT  37.8 40.0 32.2* 32.5*  MCV 95.2  --  92.3 93.7  PLT 250  --  226 257    Cardiac Enzymes:  Recent Labs Lab 03/13/14 1416 03/13/14 2300 03/14/14 0400  TROPONINI <0.30 <0.30 <0.30    BNP: No components found with this basename: POCBNP,   CBG:  Recent Labs Lab 03/14/14 1549 03/14/14 1944 03/14/14 2352 03/15/14 0408 03/15/14 0721  GLUCAP 75 130* 190* 223* 189*    Microbiology: Results for orders placed during the hospital encounter of 03/13/14  MRSA PCR SCREENING     Status: None   Collection Time    03/13/14 12:58 PM      Result Value Ref Range Status   MRSA by PCR NEGATIVE  NEGATIVE Final   Comment:            The GeneXpert MRSA Assay (FDA     approved for NASAL specimens     only), is one component of a     comprehensive MRSA colonization     surveillance program. It is not     intended to diagnose MRSA     infection nor to guide or  monitor treatment for     MRSA infections.  URINE CULTURE     Status: None   Collection Time    03/13/14 12:58 PM      Result Value Ref Range Status   Specimen Description URINE, CATHETERIZED   Final   Special Requests NONE   Final   Culture  Setup Time     Final   Value: 03/13/2014 14:12     Performed at [facility]   Colony Count     Final   Value: >=100,000 COLONIES/ML     Performed at [facility]   Culture     Final   Value: GRAM NEGATIVE RODS     Performed at [facility]   Report Status PENDING   Incomplete    Coagulation Studies:  Recent Labs  03/13/14 1255  LABPROT 13.7  INR 1.07    Urinalysis:  Recent Labs  03/13/14 1258  COLORURINE YELLOW  LABSPEC 1.019  PHURINE 5.0  GLUCOSEU NEGATIVE  HGBUR LARGE*  BILIRUBINUR SMALL*  KETONESUR 15*  PROTEINUR 100*  UROBILINOGEN 1.0  NITRITE NEGATIVE  LEUKOCYTESUR LARGE*      Imaging: Ct Angio Chest Pe W/cm &/or Wo Cm  03/13/2014   CLINICAL DATA:  Decreased oxygen saturation  EXAM: CT ANGIOGRAPHY CHEST WITH CONTRAST   TECHNIQUE: Multidetector CT imaging of the chest was performed using the standard protocol during bolus administration of intravenous contrast. Multiplanar CT image reconstructions and MIPs were obtained to evaluate the vascular anatomy.  CONTRAST:  77mL OMNIPAQUE IOHEXOL 350 MG/ML SOLN  COMPARISON:  11/08/2013  FINDINGS: An endotracheal tube, nasogastric catheter and left-sided dialysis catheter are again noted. The remains are narrowing of the right innominate vein appear in these changes are stable from the prior exam. A right-sided central venous line is noted as well.  Large bilateral pleural effusions are noted with lower lobe consolidation noted. These changes of increased in the interval from the prior exam. No focal pulmonary nodule is identified. Minimal patchy the upper lobe atelectasis is noted on right.  The thoracic aorta is well visualized and heavily calcified. No aneurysmal dilatation or dissection is noted. The origins of the great vessels are patent however some stenosis is noted in the left subclavian artery beyond its origin secondary to both calcified and noncalcified plaque. Pulmonary artery is well visualized and demonstrates a normal branching pattern. No filling defects to suggest pulmonary emboli are identified. No significant hilar or mediastinal adenopathy is noted. Scattered small mediastinal lymph nodes are seen. In retrospect these are stable from the prior exam.  Scanning into the upper abdomen reveals very mild ascites. The osseous structures show no acute abnormality.  Review of the MIP images confirms the above findings.  IMPRESSION: No evidence of pulmonary emboli.  Bilateral pleural effusions with lower lobe consolidation. This is increased in the interval from the prior exam.  Tubes and lines as described.  Chronic changes as described.   Electronically Signed   By: Alcide CleverMark  Lukens M.D.   On: 03/13/2014 18:42   Dg Chest Port 1 View  03/15/2014   CLINICAL DATA:  Shortness of  breath.  EXAM: PORTABLE CHEST - 1 VIEW  COMPARISON:  03/14/2014  FINDINGS: Endotracheal tube is 4.8 cm above the carina. Dialysis catheter tip at the cavoatrial junction and stable. Right subclavian central line in the lower SVC. Again noted are basilar densities. Prominent interstitial markings suggest interstitial edema. The right cardiac silhouette is now obscured. Negative for a pneumothorax. Nasogastric  tube extends into the abdomen.  IMPRESSION: Increased basilar densities, particularly on the right side. Findings suggest a combination of edema, pleural effusions and consolidation/atelectasis.  Support apparatuses are appropriately positioned.   Electronically Signed   By: Richarda Overlie M.D.   On: 03/15/2014 07:21   Dg Chest Port 1 View  03/14/2014   CLINICAL DATA:  Hypoxia  EXAM: PORTABLE CHEST - 1 VIEW  COMPARISON:  Chest radiograph and chest CT Mar 13, 2014  FINDINGS: Endotracheal tube tip is 6.1 cm above the carina. The dual-lumen catheter tip is at the cavoatrial junction. The right central catheter tip is in the distal superior vena cava. No pneumothorax.  There are bilateral effusions with bibasilar consolidation. There is underlying interstitial edema. Heart is upper normal in size with pulmonary venous hypertension.  IMPRESSION: Tube and catheter positions as described without appreciable pneumothorax. Underlying congestive heart failure. Superimposed pneumonia in the bases cannot be excluded on this study.   Electronically Signed   By: Bretta Bang M.D.   On: 03/14/2014 07:36   Dg Chest Port 1 View  03/13/2014   CLINICAL DATA:  Central line placement.  EXAM: PORTABLE CHEST - 1 VIEW  COMPARISON:  03/13/2014  FINDINGS: Right subclavian central line has been placed with the tip in the SVC. Left dialysis catheter, endotracheal tube and NG tube are unchanged. No pneumothorax. Bilateral lower lobe airspace opacities and effusions are again noted, stable. Heart is normal size.  IMPRESSION: New right  subclavian central line in place with the tip in the SVC. No pneumothorax. Otherwise no significant change.   Electronically Signed   By: Charlett Nose M.D.   On: 03/13/2014 16:33   Dg Chest Port 1 View  03/13/2014   CLINICAL DATA:  Hypoxia  EXAM: PORTABLE CHEST - 1 VIEW  COMPARISON:  Study obtained earlier in the day  FINDINGS: Endotracheal tube tip is 3.9 cm above the carina. Central catheter tip is essentially at the cavoatrial junction. There is no a nasogastric tube with the tip and side-port in the stomach. No pneumothorax. There is consolidation in the left lower lobe with left effusion. There is more patchy infiltrate in the right lower lobe. There is moderate interstitial edema. Heart is mildly enlarged with pulmonary venous hypertension. No adenopathy. There is atherosclerotic change in the aorta.  IMPRESSION: Congestive heart failure. Question superimposed pneumonia left lower lobe. Tube and catheter positions as described without apparent pneumothorax.   Electronically Signed   By: Bretta Bang M.D.   On: 03/13/2014 14:14     Medications:   . sodium chloride 10 mL/hr at 03/15/14 0800  . feeding supplement (NEPRO CARB STEADY) 1,000 mL (03/15/14 0700)  . fentaNYL infusion INTRAVENOUS 150 mcg/hr (03/15/14 0800)  . norepinephrine (LEVOPHED) Adult infusion Stopped (03/15/14 0800)   . antiseptic oral rinse  15 mL Mouth Rinse QID  . aspirin  81 mg Oral Daily  . atorvastatin  40 mg Per Tube q1800  . chlorhexidine  15 mL Mouth Rinse BID  . feeding supplement (PRO-STAT SUGAR FREE 64)  30 mL Per Tube BID  . heparin subcutaneous  5,000 Units Subcutaneous 3 times per day  . insulin aspart  0-9 Units Subcutaneous 6 times per day  . levothyroxine  25 mcg Per Tube QAC breakfast  . mupirocin cream   Topical Daily  . pantoprazole (PROTONIX) IV  40 mg Intravenous Q24H   sodium chloride, sodium chloride, acetaminophen (TYLENOL) oral liquid 160 mg/5 mL, feeding supplement (NEPRO CARB STEADY),  fentaNYL, lidocaine (PF), lidocaine-prilocaine, midazolam, pentafluoroprop-tetrafluoroeth  Assessment/ Plan:  73 yo WF with ESRD with HD via left ij tunneled diatek cath who was on hr way to HD 5/11 in [facility] and had a PEA ARREST and was treated with EPI and CPR  1. ESRD  Dialysis performed Tuesday  Can hold dialysis until Friday usually MWF  2. Shock weaning levophed  3.S/p PEA arrest--unclear etiology  4.COPD  On chronic [place] O2 at home ~3L.   5.Acute on chronic systolic HF with EF 30-35%  6. Fever with possible evolving pneumonia  7. Anemia stable     LOS: 2 [name] [name] @TODAY @11 :12 AM

## 2014-03-15 NOTE — Progress Notes (Signed)
PULMONARY / CRITICAL CARE MEDICINE   Name: PAVNEET MARKWOOD MRN: [id number] DOB: Sep 19, 1941    ADMISSION DATE:  03/13/2014   REFERRING MD : Duke Salvia ed PRIMARY SERVICE: PCCM  CHIEF COMPLAINT:  pea arrest  BRIEF PATIENT DESCRIPTION:  73 yo WF with ESRD with HD via left ij tunneled diatek cath who was on hr way to HD 5/11 in [facility] and had a PEA ARREST and was treated with EPI and CPR with ROSC. Taken to [facility] ED and intubated and then transferred to [facility]. Originally taken to MICU but will be transferred to CCU  For hypothermia protocol. She is intubated , not on pressor support currently.  SIGNIFICANT EVENTS / STUDIES:  5/11 pea arrest 5/11 CTA >>> no PE, b/l pleural effusions  LINES / TUBES: 5/11 ott>> 5/11 left fem cvl>>5/11 5/11 R [place] TLC>>> 5/11 L radial a-line>>>  CULTURES: None  ANTIBIOTICS: None  SUBJECTIVE: attempted to wean FiO2 to 40%; however, SpO2 dropped into 80's.  Placed back on 50%.  Became hypotensive overnight requiring pressors.  Mild fever.  VITAL SIGNS: Temp:  [97.3 F (36.3 C)-101.6 F (38.7 C)] 100.1 F (37.8 C) (05/13 0400) Pulse Rate:  [49-107] 101 (05/13 0412) Resp:  [15-31] 30 (05/13 0412) BP: (40-179)/(26-82) 115/47 mmHg (05/13 0412) SpO2:  [86 %-100 %] 93 % (05/13 0412) Arterial Line BP: (50-186)/(28-86) 92/41 mmHg (05/13 0400) FiO2 (%):  [40 %-60 %] 50 % (05/13 0412) Weight:  [70.1 kg (154 lb 8.7 oz)-72 kg (158 lb 11.7 oz)] 70.1 kg (154 lb 8.7 oz) (05/12 1320) HEMODYNAMICS:   VENTILATOR SETTINGS: Vent Mode:  [-] PRVC FiO2 (%):  [40 %-60 %] 50 % Set Rate:  [30 bmp] 30 bmp Vt Set:  [420 mL] 420 mL PEEP:  [8 cmH20-12 cmH20] 8 cmH20 Plateau Pressure:  [21 cmH20-24 cmH20] 22 cmH20 INTAKE / OUTPUT: Intake/Output     05/12 0701 - 05/13 0700   I.V. (mL/kg) 601.7 (8.6)   NG/GT 440   Total Intake(mL/kg) 1041.7 (14.9)   Urine (mL/kg/hr) 20 (0)   Other 1700 (1)   Total Output 1720   Net -678.3        PHYSICAL  EXAMINATION: General: Chronically ill appearing female, awake and following commands. Neuro: Moves all ext to command. HEENT: OGT. OTT, No JVD Cardiovascular: RRR, no M/R/G. Lungs: CTA bilaterally, no W/R/R. Abdomen:  Soft, NT, ND and +BS. Musculoskeletal:  Intact Skin: cool  LABS:  CBC  Recent Labs Lab 03/11/14 2225 03/11/14 2237 03/14/14 0450 03/15/14 0400  WBC 10.7*  --  10.9* 13.6*  HGB 11.6* 13.6 10.3* 10.2*  HCT 37.8 40.0 32.2* 32.5*  PLT 250  --  226 257   Coag's  Recent Labs Lab 03/13/14 1255  APTT 34  INR 1.07   BMET  Recent Labs Lab 03/11/14 2225 03/11/14 2237 03/13/14 1416 03/14/14 0450  NA 133* 135* 137 137  K 3.8 3.7 4.2 4.2  CL 93* 94* 94* 97  CO2 27  --  25 24  BUN 26* 26* 39* 44*  CREATININE 2.57* 2.70* 2.95* 3.70*  GLUCOSE 147* 149* 192* 82   Electrolytes  Recent Labs Lab 03/11/14 2225 03/13/14 1416 03/14/14 0450  CALCIUM 9.2 8.1* 8.2*  MG  --   --  1.8  PHOS  --   --  3.5   Sepsis Markers  Recent Labs Lab 03/11/14 2237  LATICACIDVEN 0.49*   ABG  Recent Labs Lab 03/13/14 1540 03/14/14 0344 03/15/14 0406  PHART 7.407  7.463* 7.428  PCO2ART 49.5* 37.2 41.7  PO2ART 64.0* 78.3* 67.6*   Liver Enzymes  Recent Labs Lab 03/11/14 2225  AST 29  ALT 28  ALKPHOS 164*  BILITOT 0.3  ALBUMIN 3.4*   Cardiac Enzymes  Recent Labs Lab 03/13/14 1416 03/13/14 2300 03/14/14 0400  TROPONINI <0.30 <0.30 <0.30   Glucose  Recent Labs Lab 03/14/14 1118 03/14/14 1548 03/14/14 1549 03/14/14 1944 03/14/14 2352 03/15/14 0408  GLUCAP 100* 68* 75 130* 190* 223*    Imaging Ct Angio Chest Pe W/cm &/or Wo Cm  03/13/2014   CLINICAL DATA:  Decreased oxygen saturation  EXAM: CT ANGIOGRAPHY CHEST WITH CONTRAST  TECHNIQUE: Multidetector CT imaging of the chest was performed using the standard protocol during bolus administration of intravenous contrast. Multiplanar CT image reconstructions and MIPs were obtained to evaluate the  vascular anatomy.  CONTRAST:  74mL OMNIPAQUE IOHEXOL 350 MG/ML SOLN  COMPARISON:  11/08/2013  FINDINGS: An endotracheal tube, nasogastric catheter and left-sided dialysis catheter are again noted. The remains are narrowing of the right innominate vein appear in these changes are stable from the prior exam. A right-sided central venous line is noted as well.  Large bilateral pleural effusions are noted with lower lobe consolidation noted. These changes of increased in the interval from the prior exam. No focal pulmonary nodule is identified. Minimal patchy the upper lobe atelectasis is noted on right.  The thoracic aorta is well visualized and heavily calcified. No aneurysmal dilatation or dissection is noted. The origins of the great vessels are patent however some stenosis is noted in the left subclavian artery beyond its origin secondary to both calcified and noncalcified plaque. Pulmonary artery is well visualized and demonstrates a normal branching pattern. No filling defects to suggest pulmonary emboli are identified. No significant hilar or mediastinal adenopathy is noted. Scattered small mediastinal lymph nodes are seen. In retrospect these are stable from the prior exam.  Scanning into the upper abdomen reveals very mild ascites. The osseous structures show no acute abnormality.  Review of the MIP images confirms the above findings.  IMPRESSION: No evidence of pulmonary emboli.  Bilateral pleural effusions with lower lobe consolidation. This is increased in the interval from the prior exam.  Tubes and lines as described.  Chronic changes as described.   Electronically Signed   By: Alcide Clever M.D.   On: 03/13/2014 18:42   Dg Chest Port 1 View  03/14/2014   CLINICAL DATA:  Hypoxia  EXAM: PORTABLE CHEST - 1 VIEW  COMPARISON:  Chest radiograph and chest CT Mar 13, 2014  FINDINGS: Endotracheal tube tip is 6.1 cm above the carina. The dual-lumen catheter tip is at the cavoatrial junction. The right central  catheter tip is in the distal superior vena cava. No pneumothorax.  There are bilateral effusions with bibasilar consolidation. There is underlying interstitial edema. Heart is upper normal in size with pulmonary venous hypertension.  IMPRESSION: Tube and catheter positions as described without appreciable pneumothorax. Underlying congestive heart failure. Superimposed pneumonia in the bases cannot be excluded on this study.   Electronically Signed   By: Bretta Bang M.D.   On: 03/14/2014 07:36   Dg Chest Port 1 View  03/13/2014   CLINICAL DATA:  Central line placement.  EXAM: PORTABLE CHEST - 1 VIEW  COMPARISON:  03/13/2014  FINDINGS: Right subclavian central line has been placed with the tip in the SVC. Left dialysis catheter, endotracheal tube and NG tube are unchanged. No pneumothorax. Bilateral lower lobe airspace  opacities and effusions are again noted, stable. Heart is normal size.  IMPRESSION: New right subclavian central line in place with the tip in the SVC. No pneumothorax. Otherwise no significant change.   Electronically Signed   By: Charlett NoseKevin  Dover M.D.   On: 03/13/2014 16:33   Dg Chest Port 1 View  03/13/2014   CLINICAL DATA:  Hypoxia  EXAM: PORTABLE CHEST - 1 VIEW  COMPARISON:  Study obtained earlier in the day  FINDINGS: Endotracheal tube tip is 3.9 cm above the carina. Central catheter tip is essentially at the cavoatrial junction. There is no a nasogastric tube with the tip and side-port in the stomach. No pneumothorax. There is consolidation in the left lower lobe with left effusion. There is more patchy infiltrate in the right lower lobe. There is moderate interstitial edema. Heart is mildly enlarged with pulmonary venous hypertension. No adenopathy. There is atherosclerotic change in the aorta.  IMPRESSION: Congestive heart failure. Question superimposed pneumonia left lower lobe. Tube and catheter positions as described without apparent pneumothorax.   Electronically Signed   By:  Bretta BangWilliam  Woodruff M.D.   On: 03/13/2014 14:14   ASSESSMENT / PLAN:  PULMONARY A:  VDRF secondary to PEA arrest COPD, severe hypoxemia - attempted to wean to 40% overnight; however, SpO2 dropped into 80's. P:   - Vent bundle - BD as needed - PEEP down to 8 and FiO2 to 50% and titrate for sats. - ABG and CXR in AM. - If able to get to 40% and PEEP of 5 will begin PS trials. - Likely bilateral thoras in AM.  CARDIOVASCULAR A:  Post PEA arrest CAF HTN CAD CHF ST segment changes overnight P:  - Cards signed off, will ask to see again today. - Back on pressors post dialysis.  RENAL A:   ESRD P:   - Renal consulted, appreciate assistance with HD, last session 5/12. - BMP in AM. - Replace electrolytes as indicated.  GASTROINTESTINAL A:   GI protection. Nutrition. P:   - SUP: Pantoprazole. - TF per nutrition.  HEMATOLOGIC A:   No acute issue P:  - Follow CBC. - Transfuse if <8 given CAD.  INFECTIOUS A:   Mild leukocytosis with tachycardia - ?early infectious etiology. P:   - Consider PCT algorithm to help guide decision on abx therapy. - Monitor WBC / fever curves.  ENDOCRINE A:   DM  P:   - SSI. - TF.  NEUROLOGIC A:  Decreased LOC post PEA arrest - resolving. CT head 5/11 neg P:   - Minimize sedation. - Monitor mental status.  Rutherford Guysahul Desai, PA - [facility] [place] Pgr: [phone]  or [phone]  CC time 35 min.  Patient seen and examined, agree with above note.  I dictated the care and orders written for this patient under my direction.  Alyson ReedyWesam G Airen Stiehl, MD [phone]

## 2014-03-15 NOTE — Progress Notes (Signed)
SUBJECTIVE:[name] [name] was seen and examined at bedside this morning.  She is awake, failed wean yesterday, remains intubated. Hypotensive overnight, now on pressors.    Filed Vitals:   03/15/14 0500 03/15/14 0600 03/15/14 0735 03/15/14 0739  BP: 126/40 149/42 101/40 101/40  Pulse: 97 94 98 87  Temp:      TempSrc:      Resp: 30 30  30   Height:      Weight: 149 lb 7.6 oz (67.8 kg)     SpO2: 90% 89%  93%    Intake/Output Summary (Last 24 hours) at 03/15/14 0750 Last data filed at 03/15/14 0600  Gross per 24 hour  Intake 1174.28 ml  Output   1720 ml  Net -545.72 ml   LABS: Basic Metabolic Panel:  Recent Labs  16/10/96 0450 03/15/14 0400  NA 137 137  K 4.2 3.4*  CL 97 95*  CO2 24 26  GLUCOSE 82 243*  BUN 44* 29*  CREATININE 3.70* 2.66*  CALCIUM 8.2* 8.4  MG 1.8 1.7  PHOS 3.5 2.6   CBC:  Recent Labs  03/14/14 0450 03/15/14 0400  WBC 10.9* 13.6*  HGB 10.3* 10.2*  HCT 32.2* 32.5*  MCV 92.3 93.7  PLT 226 257   Cardiac Enzymes:  Recent Labs  03/13/14 1416 03/13/14 2300 03/14/14 0400  TROPONINI <0.30 <0.30 <0.30   RADIOLOGY: Dg Chest 2 View  03/02/2014   CLINICAL DATA:  Weakness, shortness of breath  EXAM: CHEST  2 VIEW  COMPARISON:  Prior radiograph from 02/28/2014  FINDINGS: Left-sided hemodialysis catheter again noted. Cardiomegaly is stable. Mediastinal silhouette within normal limits.  Veiling opacity overlying the left hemidiaphragm is compatible with moderate left pleural effusion. A smaller right pleural effusion is likely present as well. There is mild central perihilar vascular congestion without overt pulmonary edema. No focal infiltrate identified. There is no pneumothorax.  Osseous structures are unchanged.  IMPRESSION: 1. Moderate bilateral pleural effusions, left greater than right. 2. Stable cardiomegaly without overt pulmonary edema.   Electronically Signed   By: Rise Mu M.D.   On: 03/02/2014 00:20   Dg Chest 2 View  02/28/2014    CLINICAL DATA:  Over clip chest pain, shortness of breath, history hypertension, CHF, diabetes, COPD, former smoker, end-stage renal disease on dialysis  EXAM: CHEST  2 VIEW  COMPARISON:  02/25/2014  FINDINGS: RIGHT jugular dual-lumen central venous catheter with distal tip projecting over the RIGHT atrium.  Enlargement of cardiac silhouette with pulmonary vascular congestion.  Atherosclerotic calcifications aorta.  Bibasilar effusions and atelectasis.  Peribronchial thickening with improved perihilar edema since previous exam.  No pneumothorax.  Bones demineralized.  IMPRESSION: Enlargement of cardiac silhouette with pulmonary vascular congestion.  Improved pulmonary edema with persistent bibasilar effusions and atelectasis.   Electronically Signed   By: Ulyses Southward M.D.   On: 02/28/2014 15:29   Ct Angio Chest Pe W/cm &/or Wo Cm  03/13/2014   CLINICAL DATA:  Decreased oxygen saturation  EXAM: CT ANGIOGRAPHY CHEST WITH CONTRAST  TECHNIQUE: Multidetector CT imaging of the chest was performed using the standard protocol during bolus administration of intravenous contrast. Multiplanar CT image reconstructions and MIPs were obtained to evaluate the vascular anatomy.  CONTRAST:  77mL OMNIPAQUE IOHEXOL 350 MG/ML SOLN  COMPARISON:  11/08/2013  FINDINGS: An endotracheal tube, nasogastric catheter and left-sided dialysis catheter are again noted. The remains are narrowing of the right innominate vein appear in these changes are stable from the prior exam. A right-sided central  venous line is noted as well.  Large bilateral pleural effusions are noted with lower lobe consolidation noted. These changes of increased in the interval from the prior exam. No focal pulmonary nodule is identified. Minimal patchy the upper lobe atelectasis is noted on right.  The thoracic aorta is well visualized and heavily calcified. No aneurysmal dilatation or dissection is noted. The origins of the great vessels are patent however some  stenosis is noted in the left subclavian artery beyond its origin secondary to both calcified and noncalcified plaque. Pulmonary artery is well visualized and demonstrates a normal branching pattern. No filling defects to suggest pulmonary emboli are identified. No significant hilar or mediastinal adenopathy is noted. Scattered small mediastinal lymph nodes are seen. In retrospect these are stable from the prior exam.  Scanning into the upper abdomen reveals very mild ascites. The osseous structures show no acute abnormality.  Review of the MIP images confirms the above findings.  IMPRESSION: No evidence of pulmonary emboli.  Bilateral pleural effusions with lower lobe consolidation. This is increased in the interval from the prior exam.  Tubes and lines as described.  Chronic changes as described.   Electronically Signed   By: Alcide CleverMark  Lukens M.D.   On: 03/13/2014 18:42   Dg Chest Port 1 View  03/13/2014   CLINICAL DATA:  Central line placement.  EXAM: PORTABLE CHEST - 1 VIEW  COMPARISON:  03/13/2014  FINDINGS: Right subclavian central line has been placed with the tip in the SVC. Left dialysis catheter, endotracheal tube and NG tube are unchanged. No pneumothorax. Bilateral lower lobe airspace opacities and effusions are again noted, stable. Heart is normal size.  IMPRESSION: New right subclavian central line in place with the tip in the SVC. No pneumothorax. Otherwise no significant change.   Electronically Signed   By: Charlett NoseKevin  Dover M.D.   On: 03/13/2014 16:33   Dg Chest Port 1 View  03/13/2014   CLINICAL DATA:  Hypoxia  EXAM: PORTABLE CHEST - 1 VIEW  COMPARISON:  Study obtained earlier in the day  FINDINGS: Endotracheal tube tip is 3.9 cm above the carina. Central catheter tip is essentially at the cavoatrial junction. There is no a nasogastric tube with the tip and side-port in the stomach. No pneumothorax. There is consolidation in the left lower lobe with left effusion. There is more patchy infiltrate in  the right lower lobe. There is moderate interstitial edema. Heart is mildly enlarged with pulmonary venous hypertension. No adenopathy. There is atherosclerotic change in the aorta.  IMPRESSION: Congestive heart failure. Question superimposed pneumonia left lower lobe. Tube and catheter positions as described without apparent pneumothorax.   Electronically Signed   By: Bretta BangWilliam  Woodruff M.D.   On: 03/13/2014 14:14   Dg Chest Port 1 View  03/11/2014   CLINICAL DATA:  Shortness of breath  EXAM: PORTABLE CHEST - 1 VIEW  COMPARISON:  03/01/2014  FINDINGS: Left IJ dialysis catheter, tip at the level of the upper right atrium.  Stable mild cardiomegaly. Widening of the upper mediastinum related to apical lordotic positioning. Small to moderate bilateral pleural effusions. These are likely stable from prior given differences in patient positioning, with inferior flow of fluid on the right. Venous congestion with diffuse interstitial coarsening and cephalized blood flow, increased from previous.  IMPRESSION: 1. Small to moderate bilateral pleural effusions. 2. Pulmonary venous hypertension.   Electronically Signed   By: Tiburcio PeaJonathan  Watts M.D.   On: 03/11/2014 22:35   [facility]  02/25/2014   CLINICAL DATA:  Respiratory failure  EXAM: PORTABLE CHEST - 1 VIEW  COMPARISON:  Prior chest x-ray 02/24/2014  FINDINGS: Stable cardiomegaly. Left IJ approach split tip hemodialysis catheter with catheter tips at the superior cavoatrial junction and upper right atrium. Atherosclerotic calcifications in the transverse aorta. Increased pulmonary edema. Enlarging bilateral layering pleural effusions and associated bibasilar atelectasis. No acute osseous abnormality. No pneumothorax.  IMPRESSION: Developing pulmonary edema and enlarging bilateral layering effusions. Differential considerations include CHF and volume overload.   Electronically Signed   By: Malachy Moan M.D.   On: 02/25/2014 09:16   Dg Chest Port 1  View  02/24/2014   CLINICAL DATA:  post-arrest  EXAM: PORTABLE CHEST - 1 VIEW  COMPARISON:  DG CHEST 1V PORT dated 01/04/2014  FINDINGS: The cardiac silhouette is enlarged. There is diffuse prominence of interstitial markings, peribronchial cuffing, indistinctness of pulmonary vasculature. Areas of increased density in the lung bases. Small bilateral effusions left greater than right. No acute osseous abnormalities.  IMPRESSION: Pulmonary edema and small bilateral effusions. Areas of increased density within the lung bases consistent with areas of asymmetric edema versus atelectasis.   Electronically Signed   By: Salome Holmes M.D.   On: 02/24/2014 08:12   Dg Abd Portable 1v  02/24/2014   CLINICAL DATA:  confirm R fem line  EXAM: PORTABLE ABDOMEN - 1 VIEW  COMPARISON:  DG LUMBAR SPINE COMPLETE 4+V dated 01/16/2014; CT ABD/PELV WO CM dated 03/09/2013  FINDINGS: The bowel gas pattern is normal. Coarse vascular calcifications appreciated. Tip of a right femoral central venous catheter is appreciated projecting in the region of the proximal femoral vein on the right. No acute osseous abnormalities.  IMPRESSION: Nonobstructive bowel gas pattern. Right femoral vein central venous catheter appreciated.   Electronically Signed   By: Salome Holmes M.D.   On: 02/24/2014 08:11   Dg Foot Complete Left  02/24/2014   CLINICAL DATA:  Left heel wound.  EXAM: LEFT FOOT - COMPLETE 3+ VIEW  COMPARISON:  Os calcis 04/19/2013.  FINDINGS: Skin wound is seen along the lateral margin of the calcaneus. No bony destructive change is seen although the calcaneus is not well visualized on this exam. Pes cavus deformity is noted.  IMPRESSION: Soft tissue wound lateral calcaneus. No plain film evidence of osteomyelitis although the calcaneus is poorly visualized.  Pes cavus.   Electronically Signed   By: Drusilla Kanner M.D.   On: 02/24/2014 20:42   PHYSICAL EXAM General: lying in bed, ETT tube, on mechanical ventilation R SCL  CVL Lungs: coarse b/l breath sounds CV: RRR  Abdomen: Soft, nontender, +bs Neurologic:alert and awake, responding to commands Extremities: moving all 4 extremities. Ulcer on left foot. SCDs in place. No edema  ASSESSMENT AND PLAN: [name] [name] is a 73 year old female with PMH notable for ESRD on HD, DM2, extensive cardiac history with recent NSTEMI and cardiac arrest, transferred to [facility] s/p PEA arrest.   S/p PEA arrest--unclear etiology. Hx of bradycardia in the past along with CAD notable for cath in 2013 with failed PCI to RCA.  NOT cooled. Cardiac enzymes x3 negative and CTA neg for PE. Last echo 02/2014: 02/2014: moderately to severely reduced systolic function with EF 30-35% and severe hypokinesis. Pressors restarted overnight.  -coreg 3.125mg  bid, holding in setting of hypotension -currently on Levophed, wean to off -continue asa and statin  -echo pending  Acute on chronic respiratory failure in setting of PEA arrest. Of note, was recently seen  in ED for hypoxemia on 03/11/14. On chronic [place] O2 at home ~3L. PCCM primary. CTA neg for PE, b/l pleural effusions with lower lobe consolidation. Tmax 101.41F midnight 5/13 this admission.  -intubated, on mechanical ventilation  -on fentanyl  -versed prn  -trend renal function panel  Acute on chronic systolic HF with EF 30-35% per recent echo. Edema on CXR and noted to be on ~3L [place] O2 at home. -repeat echo  -HD yesterday but hypotension overnight, back on pressors -renal following   DM2--last A1C 7.5  -SSI sensitive for now   HTN--home medications include norvasc, coreg 3.125mg  bid, Imdur.   -holding all anti-hypertensive's in setting of PEA arrest for now and hypotension  ESRD on HD--MWF, HD yesterday. Would benefit from volume removal however limited by blood pressure.  -Renal following  Fever--Tmax 101.41F. Urine cx >100,000 colonies gram negative rods could be contributing factor. However, pulmonary process also possible given increased  densities on cxr R>L. WBC trending up to 13.6 this morning -consider starting abx, Rocephin?  Case discussed and patient seen with Dr. Vilma Meckel  Signed: Darden Palmer, MD PGY-2, Internal Medicine Resident Pager: [phone]  03/15/2014,7:50 AM   Patient seen and examined with Dr. Virgina Organ. We discussed all aspects of the encounter. I agree with the assessment and plan as stated above. [name] [name] has had recurrent PEA arrest in setting of hypoxia/HD. She has known 3vCAD that is not amenable to PCI by cath in 2013. Cardiac markers remain negative despite severe metabolic stress. ECG show repolarization changes which are chronic. At this point, I do not think myocardial ischemia is a major factor in her current situation and I would not pursue repeat cath at this time. Repeat echo has been ordered and we will f/u on that. Otherwise we will sign off. Please call with questions.   Bevelyn Buckles Kaidyn Hernandes,MD 9:33 AM

## 2014-03-16 ENCOUNTER — Inpatient Hospital Stay (HOSPITAL_COMMUNITY): Payer: Medicare Other

## 2014-03-16 DIAGNOSIS — R55 Syncope and collapse: Secondary | ICD-10-CM

## 2014-03-16 LAB — BLOOD GAS, ARTERIAL
Acid-Base Excess: 3.2 mmol/L — ABNORMAL HIGH (ref 0.0–2.0)
BICARBONATE: 27.3 meq/L — AB (ref 20.0–24.0)
Drawn by: 39899
FIO2: 40 %
LHR: 30 {breaths}/min
MECHVT: 420 mL
O2 Saturation: 91.9 %
PEEP: 8 cmH2O
PH ART: 7.421 (ref 7.350–7.450)
Patient temperature: 99.1
TCO2: 28.6 mmol/L (ref 0–100)
pCO2 arterial: 42.9 mmHg (ref 35.0–45.0)
pO2, Arterial: 59.2 mmHg — ABNORMAL LOW (ref 80.0–100.0)

## 2014-03-16 LAB — URINE CULTURE: Colony Count: >=100000

## 2014-03-16 LAB — BASIC METABOLIC PANEL
BUN: 49 mg/dL — AB (ref 6–23)
CO2: 26 mEq/L (ref 19–32)
Calcium: 8.3 mg/dL — ABNORMAL LOW (ref 8.4–10.5)
Chloride: 97 mEq/L (ref 96–112)
Creatinine, Ser: 3.92 mg/dL — ABNORMAL HIGH (ref 0.50–1.10)
GFR calc non Af Amer: 11 mL/min — ABNORMAL LOW (ref >90)
GFR, EST AFRICAN AMERICAN: 12 mL/min — AB (ref >90)
GLUCOSE: 197 mg/dL — AB (ref 70–99)
Potassium: 3.4 mEq/L — ABNORMAL LOW (ref 3.7–5.3)
Sodium: 137 mEq/L (ref 137–147)

## 2014-03-16 LAB — GLUCOSE, CAPILLARY
GLUCOSE-CAPILLARY: 147 mg/dL — AB (ref 70–99)
Glucose-Capillary: 162 mg/dL — ABNORMAL HIGH (ref 70–99)
Glucose-Capillary: 179 mg/dL — ABNORMAL HIGH (ref 70–99)
Glucose-Capillary: 194 mg/dL — ABNORMAL HIGH (ref 70–99)
Glucose-Capillary: 200 mg/dL — ABNORMAL HIGH (ref 70–99)
Glucose-Capillary: 204 mg/dL — ABNORMAL HIGH (ref 70–99)

## 2014-03-16 LAB — CBC
HEMATOCRIT: 28.6 % — AB (ref 36.0–46.0)
Hemoglobin: 9 g/dL — ABNORMAL LOW (ref 12.0–15.0)
MCH: 29.5 pg (ref 26.0–34.0)
MCHC: 31.5 g/dL (ref 30.0–36.0)
MCV: 93.8 fL (ref 78.0–100.0)
Platelets: 208 10*3/uL (ref 150–400)
RBC: 3.05 MIL/uL — ABNORMAL LOW (ref 3.87–5.11)
RDW: 17.6 % — AB (ref 11.5–15.5)
WBC: 10.9 10*3/uL — AB (ref 4.0–10.5)

## 2014-03-16 LAB — PHOSPHORUS: Phosphorus: 3.1 mg/dL (ref 2.3–4.6)

## 2014-03-16 LAB — MAGNESIUM: Magnesium: 2 mg/dL (ref 1.5–2.5)

## 2014-03-16 MED ORDER — CIPROFLOXACIN IN D5W 400 MG/200ML IV SOLN
400.0000 mg | INTRAVENOUS | Status: DC
  Administered 2014-03-16 – 2014-03-20 (×4): 400 mg via INTRAVENOUS
  Filled 2014-03-16 (×6): qty 200

## 2014-03-16 MED ORDER — PANTOPRAZOLE SODIUM 40 MG PO PACK
40.0000 mg | PACK | Freq: Every day | ORAL | Status: DC
  Administered 2014-03-17 – 2014-03-20 (×3): 40 mg
  Filled 2014-03-16 (×5): qty 20

## 2014-03-16 MED FILL — Fentanyl Citrate Inj 0.05 MG/ML: INTRAMUSCULAR | Qty: 2 | Status: AC

## 2014-03-16 NOTE — Progress Notes (Addendum)
PULMONARY / CRITICAL CARE MEDICINE   Name: [name] [name] MRN: [id number] DOB: 1941-10-01    ADMISSION DATE:  03/13/2014   REFERRING MD : Duke Salvia ed PRIMARY SERVICE: PCCM  CHIEF COMPLAINT:  pea arrest  BRIEF PATIENT DESCRIPTION:  73 yo WF with ESRD with HD via left ij tunneled diatek cath who was on hr way to HD 5/11 in [facility] and had a PEA ARREST and was treated with EPI and CPR with ROSC. Taken to [facility] ED and intubated and then transferred to [facility]. Originally taken to MICU but will be transferred to CCU  For hypothermia protocol. She is intubated , not on pressor support currently.  SIGNIFICANT EVENTS / STUDIES:  5/11 pea arrest 5/11 CTA >>> no PE, b/l pleural effusions  LINES / TUBES: 5/11 ott>> 5/11 left fem cvl>>5/11 5/11 R [place] TLC>>> 5/11 L radial a-line>>>  CULTURES: None  ANTIBIOTICS: None  SUBJECTIVE: attempted to wean FiO2 to 40%; however, SpO2 dropped into 80's.  Placed back on 50%.  Became hypotensive overnight requiring pressors.  Mild fever.  VITAL SIGNS: Temp:  [97.7 F (36.5 C)-100.1 F (37.8 C)] 99.4 F (37.4 C) (05/14 0740) Pulse Rate:  [79-95] 90 (05/14 0830) Resp:  [26-30] 30 (05/14 0830) BP: (80-157)/(26-72) 157/51 mmHg (05/14 0811) SpO2:  [88 %-98 %] 94 % (05/14 0830) Arterial Line BP: (87-160)/(34-98) 151/51 mmHg (05/14 0830) FiO2 (%):  [40 %-50 %] 40 % (05/14 0811) Weight:  [149 lb 14.6 oz (68 kg)] 149 lb 14.6 oz (68 kg) (05/14 0500) HEMODYNAMICS:   VENTILATOR SETTINGS: Vent Mode:  [-] PRVC FiO2 (%):  [40 %-50 %] 40 % Set Rate:  [30 bmp] 30 bmp Vt Set:  [420 mL] 420 mL PEEP:  [8 cmH20] 8 cmH20 Plateau Pressure:  [19 cmH20-25 cmH20] 25 cmH20 INTAKE / OUTPUT: Intake/Output     05/13 0701 - 05/14 0700 05/14 0701 - 05/15 0700   I.V. (mL/kg) 625.3 (9.2)    NG/GT 1070    Total Intake(mL/kg) 1695.3 (24.9)    Urine (mL/kg/hr)     Other     Total Output       Net +1695.3           PHYSICAL EXAMINATION: General:  Chronically ill appearing female, awake and following commands. Neuro: Moves all ext to command. HEENT: OGT. OTT, No JVD Cardiovascular: RRR, no M/R/G. Lungs: CTA bilaterally, no W/R/R. Abdomen:  Soft, NT, ND and +BS. Musculoskeletal:  Intact Skin: cool  LABS:  CBC  Recent Labs Lab 03/14/14 0450 03/15/14 0400 03/16/14 0430  WBC 10.9* 13.6* 10.9*  HGB 10.3* 10.2* 9.0*  HCT 32.2* 32.5* 28.6*  PLT 226 257 208   Coag's  Recent Labs Lab 03/13/14 1255  APTT 34  INR 1.07   BMET  Recent Labs Lab 03/14/14 0450 03/15/14 0400 03/16/14 0430  NA 137 137 137  K 4.2 3.4* 3.4*  CL 97 95* 97  CO2 24 26 26   BUN 44* 29* 49*  CREATININE 3.70* 2.66* 3.92*  GLUCOSE 82 243* 197*   Electrolytes  Recent Labs Lab 03/14/14 0450 03/15/14 0400 03/16/14 0430  CALCIUM 8.2* 8.4 8.3*  MG 1.8 1.7 2.0  PHOS 3.5 2.6 3.1   Sepsis Markers  Recent Labs Lab 03/11/14 2237  LATICACIDVEN 0.49*   ABG  Recent Labs Lab 03/14/14 0344 03/15/14 0406 03/16/14 0400  PHART 7.463* 7.428 7.421  PCO2ART 37.2 41.7 42.9  PO2ART 78.3* 67.6* 59.2*   Liver Enzymes  Recent Labs Lab 03/11/14  2225  AST 29  ALT 28  ALKPHOS 164*  BILITOT 0.3  ALBUMIN 3.4*   Cardiac Enzymes  Recent Labs Lab 03/13/14 1416 03/13/14 2300 03/14/14 0400  TROPONINI <0.30 <0.30 <0.30   Glucose  Recent Labs Lab 03/15/14 1143 03/15/14 1603 03/15/14 1943 03/16/14 0012 03/16/14 0409 03/16/14 0743  GLUCAP 194* 213* 187* 162* 200* 179*    Imaging Dg Chest Port 1 View  03/16/2014   CLINICAL DATA:  Assess endotracheal tube  EXAM: PORTABLE CHEST - 1 VIEW  COMPARISON:  DG CHEST 1V PORT dated 03/15/2014; DG CHEST 1V PORT dated 03/14/2014; CT ANGIO CHEST W/CM &/OR WO/CM dated 03/13/2014  FINDINGS: No change endotracheal tube again about 4.7 cm above the carinal. Although lines and tubes are also unchanged.  Cardiac enlargement with vascular congestion. Mild diffuse interstitial prominence with more focal  consolidation right lower lobe. Hazy bibasilar densities suggesting pleural effusions, right greater than left.  IMPRESSION: Congestive heart failure with pulmonary edema, with improved aeration at the left base and no significant change on the right.   Electronically Signed   By: Esperanza Heiraymond  Rubner M.D.   On: 03/16/2014 07:21   Dg Chest Port 1 View  03/15/2014   CLINICAL DATA:  Shortness of breath.  EXAM: PORTABLE CHEST - 1 VIEW  COMPARISON:  03/14/2014  FINDINGS: Endotracheal tube is 4.8 cm above the carina. Dialysis catheter tip at the cavoatrial junction and stable. Right subclavian central line in the lower SVC. Again noted are basilar densities. Prominent interstitial markings suggest interstitial edema. The right cardiac silhouette is now obscured. Negative for a pneumothorax. Nasogastric tube extends into the abdomen.  IMPRESSION: Increased basilar densities, particularly on the right side. Findings suggest a combination of edema, pleural effusions and consolidation/atelectasis.  Support apparatuses are appropriately positioned.   Electronically Signed   By: Richarda OverlieAdam  Henn M.D.   On: 03/15/2014 07:21   ASSESSMENT / PLAN:  PULMONARY A:  VDRF secondary to PEA arrest COPD, severe hypoxemia - attempted to wean to 40% overnight; however, SpO2 dropped into 80's. P:   - BD as needed - Decrease PEEP down to 5 and FiO2 to 40% and titrate for sats. - ABG and CXR in AM. - Begin PS trials. - Will U/S chest later on today for ?thora. - After HD in AM will consider extubation.  CARDIOVASCULAR A:  Post PEA arrest CAF HTN CAD CHF ST segment changes overnight P:  - Cards signed off, will ask to see again today. - Will attempt to maintain of fpressors today.  RENAL A:   ESRD P:   - Renal consulted, appreciate assistance with HD, HD on 5/15. - BMP in AM. - Replace electrolytes as indicated.  GASTROINTESTINAL A:   GI protection. Nutrition. P:   - SUP: Pantoprazole. - TF per  nutrition.  HEMATOLOGIC A:   No acute issue P:  - Follow CBC. - Transfuse if <8 given CAD.  INFECTIOUS A:   Mild leukocytosis with tachycardia - ?early infectious etiology. P:   - Monitor WBC / fever curves.  ENDOCRINE A:   DM  P:   - SSI. - TF.  NEUROLOGIC A:  Decreased LOC post PEA arrest - resolving. CT head 5/11 neg P:   - Minimize sedation. - Monitor mental status.  Will HD in AM and extubate after that.  Will need to speak with family when available regarding code status, patient has been deteriorating for months now.  CC time 35 min.  Alyson ReedyWesam G. Yacoub, M.D. [facility]  Pulmonary/Critical Care Medicine. Pager: [phone]. After hours pager: [phone].

## 2014-03-16 NOTE — Progress Notes (Signed)
Inpatient Diabetes Program Recommendations  AACE/ADA: New Consensus Statement on Inpatient Glycemic Control (2013)  Target Ranges:  Prepandial:   less than 140 mg/dL      Peak postprandial:   less than 180 mg/dL (1-2 hours)      Critically ill patients:  140 - 180 mg/dL  Results for NERIDA, JENNER (MRN [id number]) as of 03/16/2014 07:52  Ref. Range 03/16/2014 04:09  Glucose-Capillary Latest Range: 70-99 mg/dL 144 (H)   Inpatient Diabetes Program Recommendations Insulin - Basal: add home dose Lantus Thank you  Piedad Climes BSN, RN,CDE Inpatient Diabetes Coordinator [phone] (team pager)

## 2014-03-16 NOTE — Progress Notes (Signed)
[facility] ROUNDING NOTE   Subjective:   Interval History: intubated and awake   Objective:  Vital signs in last 24 hours:  Temp:  [97.7 F (36.5 C)-100.1 F (37.8 C)] 99.4 F (37.4 C) (05/14 0740) Pulse Rate:  [79-95] 90 (05/14 0900) Resp:  [26-30] 30 (05/14 0900) BP: (80-157)/(26-72) 142/51 mmHg (05/14 0900) SpO2:  [88 %-98 %] 96 % (05/14 0900) Arterial Line BP: (87-160)/(34-98) 159/84 mmHg (05/14 0900) FiO2 (%):  [40 %-50 %] 40 % (05/14 0900) Weight:  [68 kg (149 lb 14.6 oz)] 68 kg (149 lb 14.6 oz) (05/14 0500)  Weight change: -4 kg (-8 lb 13.1 oz) Filed Weights   03/14/14 1320 03/15/14 0500 03/16/14 0500  Weight: 70.1 kg (154 lb 8.7 oz) 67.8 kg (149 lb 7.6 oz) 68 kg (149 lb 14.6 oz)    Intake/Output: I/O last 3 completed shifts: In: 2555.6 [I.V.:1015.6; NG/GT:1540] Out: -    Intake/Output this shift:  Total I/O In: 158.3 [I.V.:38.3; NG/GT:120] Out: 0   CVS- RRR RS- CTA ABD- BS present soft non-distended EXT- no edema   Basic Metabolic Panel:  Recent Labs Lab 03/11/14 2225 03/11/14 2237 03/13/14 1416 03/14/14 0450 03/15/14 0400 03/16/14 0430  NA 133* 135* 137 137 137 137  K 3.8 3.7 4.2 4.2 3.4* 3.4*  CL 93* 94* 94* 97 95* 97  CO2 27  --  25 24 26 26   GLUCOSE 147* 149* 192* 82 243* 197*  BUN 26* 26* 39* 44* 29* 49*  CREATININE 2.57* 2.70* 2.95* 3.70* 2.66* 3.92*  CALCIUM 9.2  --  8.1* 8.2* 8.4 8.3*  MG  --   --   --  1.8 1.7 2.0  PHOS  --   --   --  3.5 2.6 3.1    Liver Function Tests:  Recent Labs Lab 03/11/14 2225  AST 29  ALT 28  ALKPHOS 164*  BILITOT 0.3  PROT 6.9  ALBUMIN 3.4*   No results found for this basename: LIPASE, AMYLASE,  in the last 168 hours No results found for this basename: AMMONIA,  in the last 168 hours  CBC:  Recent Labs Lab 03/11/14 2225 03/11/14 2237 03/14/14 0450 03/15/14 0400 03/16/14 0430  WBC 10.7*  --  10.9* 13.6* 10.9*  NEUTROABS 8.5*  --   --   --   --   HGB 11.6* 13.6 10.3*  10.2* 9.0*  HCT 37.8 40.0 32.2* 32.5* 28.6*  MCV 95.2  --  92.3 93.7 93.8  PLT 250  --  226 257 208    Cardiac Enzymes:  Recent Labs Lab 03/13/14 1416 03/13/14 2300 03/14/14 0400  TROPONINI <0.30 <0.30 <0.30    BNP: No components found with this basename: POCBNP,   CBG:  Recent Labs Lab 03/15/14 1603 03/15/14 1943 03/16/14 0012 03/16/14 0409 03/16/14 0743  GLUCAP 213* 187* 162* 200* 179*    Microbiology: Results for orders placed during the hospital encounter of 03/13/14  MRSA PCR SCREENING     Status: None   Collection Time    03/13/14 12:58 PM      Result Value Ref Range Status   MRSA by PCR NEGATIVE  NEGATIVE Final   Comment:            The GeneXpert MRSA Assay (FDA     approved for NASAL specimens     only), is one component of a     comprehensive MRSA colonization     surveillance program. It is not  intended to diagnose MRSA     infection nor to guide or     monitor treatment for     MRSA infections.  URINE CULTURE     Status: None   Collection Time    03/13/14 12:58 PM      Result Value Ref Range Status   Specimen Description URINE, CATHETERIZED   Final   Special Requests NONE   Final   Culture  Setup Time     Final   Value: 03/13/2014 14:12     Performed at [facility] Count     Final   Value: >=100,000 COLONIES/ML     Performed at [facility]   Culture     Final   Value: CITROBACTER FREUNDII     Performed at [facility]   Report Status 03/16/2014 FINAL   Final   Organism ID, Bacteria CITROBACTER FREUNDII   Final    Coagulation Studies:  Recent Labs  03/13/14 1255  LABPROT 13.7  INR 1.07    Urinalysis:  Recent Labs  03/13/14 1258  COLORURINE YELLOW  LABSPEC 1.019  PHURINE 5.0  GLUCOSEU NEGATIVE  HGBUR LARGE*  BILIRUBINUR SMALL*  KETONESUR 15*  PROTEINUR 100*  UROBILINOGEN 1.0  NITRITE NEGATIVE  LEUKOCYTESUR LARGE*      Imaging: Dg Chest Port 1 View  03/16/2014   CLINICAL  DATA:  Assess endotracheal tube  EXAM: PORTABLE CHEST - 1 VIEW  COMPARISON:  DG CHEST 1V PORT dated 03/15/2014; DG CHEST 1V PORT dated 03/14/2014; CT ANGIO CHEST W/CM &/OR WO/CM dated 03/13/2014  FINDINGS: No change endotracheal tube again about 4.7 cm above the carinal. Although lines and tubes are also unchanged.  Cardiac enlargement with vascular congestion. Mild diffuse interstitial prominence with more focal consolidation right lower lobe. Hazy bibasilar densities suggesting pleural effusions, right greater than left.  IMPRESSION: Congestive heart failure with pulmonary edema, with improved aeration at the left base and no significant change on the right.   Electronically Signed   By: Esperanza Heir M.D.   On: 03/16/2014 07:21   Dg Chest Port 1 View  03/15/2014   CLINICAL DATA:  Shortness of breath.  EXAM: PORTABLE CHEST - 1 VIEW  COMPARISON:  03/14/2014  FINDINGS: Endotracheal tube is 4.8 cm above the carina. Dialysis catheter tip at the cavoatrial junction and stable. Right subclavian central line in the lower SVC. Again noted are basilar densities. Prominent interstitial markings suggest interstitial edema. The right cardiac silhouette is now obscured. Negative for a pneumothorax. Nasogastric tube extends into the abdomen.  IMPRESSION: Increased basilar densities, particularly on the right side. Findings suggest a combination of edema, pleural effusions and consolidation/atelectasis.  Support apparatuses are appropriately positioned.   Electronically Signed   By: Richarda Overlie M.D.   On: 03/15/2014 07:21     Medications:   . sodium chloride 10 mL/hr at 03/16/14 0800  . feeding supplement (NEPRO CARB STEADY) 1,000 mL (03/16/14 0800)  . fentaNYL infusion INTRAVENOUS Stopped (03/16/14 0805)  . norepinephrine (LEVOPHED) Adult infusion Stopped (03/16/14 0805)   . antiseptic oral rinse  15 mL Mouth Rinse QID  . aspirin  81 mg Oral Daily  . atorvastatin  40 mg Per Tube q1800  . chlorhexidine  15 mL  Mouth Rinse BID  . feeding supplement (PRO-STAT SUGAR FREE 64)  30 mL Per Tube BID  . heparin subcutaneous  5,000 Units Subcutaneous 3 times per day  . insulin aspart  0-9 Units Subcutaneous 6  times per day  . levothyroxine  25 mcg Per Tube QAC breakfast  . mupirocin cream   Topical Daily  . pantoprazole (PROTONIX) IV  40 mg Intravenous Q24H   sodium chloride, sodium chloride, acetaminophen (TYLENOL) oral liquid 160 mg/5 mL, feeding supplement (NEPRO CARB STEADY), fentaNYL, lidocaine (PF), lidocaine-prilocaine, midazolam, pentafluoroprop-tetrafluoroeth  Assessment/ Plan:  73 yo WF with ESRD with HD via left ij tunneled diatek cath who was on hr way to HD 5/11 in [facility] and had a PEA ARREST and was treated with EPI and CPR   1. ESRD Dialysis performed Tuesday Can hold dialysis until Friday usually MWF  2. Shock no longer on Levophed 3.S/p PEA arrest--unclear etiology  4.COPD On chronic [place] O2 at home ~3L.  5.Acute on chronic systolic HF with EF 30-35%  6. Anemia stable  Plan dialysis tomorrow    LOS: 3 [name] @TODAY @10 :38 AM

## 2014-03-17 ENCOUNTER — Inpatient Hospital Stay (HOSPITAL_COMMUNITY): Payer: Medicare Other

## 2014-03-17 LAB — BASIC METABOLIC PANEL
BUN: 69 mg/dL — AB (ref 6–23)
CO2: 24 mEq/L (ref 19–32)
Calcium: 8.5 mg/dL (ref 8.4–10.5)
Chloride: 93 mEq/L — ABNORMAL LOW (ref 96–112)
Creatinine, Ser: 4.56 mg/dL — ABNORMAL HIGH (ref 0.50–1.10)
GFR calc Af Amer: 10 mL/min — ABNORMAL LOW (ref >90)
GFR, EST NON AFRICAN AMERICAN: 9 mL/min — AB (ref >90)
Glucose, Bld: 197 mg/dL — ABNORMAL HIGH (ref 70–99)
POTASSIUM: 3.5 meq/L — AB (ref 3.7–5.3)
Sodium: 132 mEq/L — ABNORMAL LOW (ref 137–147)

## 2014-03-17 LAB — PHOSPHORUS: Phosphorus: 3.1 mg/dL (ref 2.3–4.6)

## 2014-03-17 LAB — GLUCOSE, CAPILLARY
GLUCOSE-CAPILLARY: 144 mg/dL — AB (ref 70–99)
GLUCOSE-CAPILLARY: 192 mg/dL — AB (ref 70–99)
GLUCOSE-CAPILLARY: 207 mg/dL — AB (ref 70–99)
GLUCOSE-CAPILLARY: 211 mg/dL — AB (ref 70–99)
Glucose-Capillary: 130 mg/dL — ABNORMAL HIGH (ref 70–99)
Glucose-Capillary: 215 mg/dL — ABNORMAL HIGH (ref 70–99)

## 2014-03-17 LAB — POCT I-STAT 3, ART BLOOD GAS (G3+)
Acid-Base Excess: 1 mmol/L (ref 0.0–2.0)
Bicarbonate: 25.6 mEq/L — ABNORMAL HIGH (ref 20.0–24.0)
O2 Saturation: 87 %
PCO2 ART: 39 mmHg (ref 35.0–45.0)
PH ART: 7.425 (ref 7.350–7.450)
Patient temperature: 37
TCO2: 27 mmol/L (ref 0–100)
pO2, Arterial: 52 mmHg — ABNORMAL LOW (ref 80.0–100.0)

## 2014-03-17 LAB — CBC
HCT: 30.3 % — ABNORMAL LOW (ref 36.0–46.0)
HEMOGLOBIN: 9.5 g/dL — AB (ref 12.0–15.0)
MCH: 29.2 pg (ref 26.0–34.0)
MCHC: 31.4 g/dL (ref 30.0–36.0)
MCV: 93.2 fL (ref 78.0–100.0)
Platelets: 216 10*3/uL (ref 150–400)
RBC: 3.25 MIL/uL — ABNORMAL LOW (ref 3.87–5.11)
RDW: 17.3 % — ABNORMAL HIGH (ref 11.5–15.5)
WBC: 10.4 10*3/uL (ref 4.0–10.5)

## 2014-03-17 LAB — MAGNESIUM: Magnesium: 1.9 mg/dL (ref 1.5–2.5)

## 2014-03-17 MED ORDER — LIDOCAINE-PRILOCAINE 2.5-2.5 % EX CREA: 1.0000 "application " | TOPICAL_CREAM | CUTANEOUS | Status: DC | PRN

## 2014-03-17 MED ORDER — SODIUM CHLORIDE 0.9 % IV SOLN: 100.0000 mL | INTRAVENOUS | Status: DC | PRN

## 2014-03-17 MED ORDER — NEPRO/CARBSTEADY PO LIQD: 237.0000 mL | ORAL | Status: DC | PRN

## 2014-03-17 MED ORDER — ALTEPLASE 2 MG IJ SOLR
2.0000 mg | Freq: Once | INTRAMUSCULAR | Status: AC | PRN
  Filled 2014-03-17: qty 2

## 2014-03-17 MED ORDER — LIDOCAINE HCL (PF) 1 % IJ SOLN: 5.0000 mL | INTRAMUSCULAR | Status: DC | PRN

## 2014-03-17 MED ORDER — HEPARIN SODIUM (PORCINE) 1000 UNIT/ML DIALYSIS
1000.0000 [IU] | INTRAMUSCULAR | Status: DC | PRN
  Filled 2014-03-17: qty 1

## 2014-03-17 MED ORDER — PENTAFLUOROPROP-TETRAFLUOROETH EX AERO: 1.0000 "application " | INHALATION_SPRAY | CUTANEOUS | Status: DC | PRN

## 2014-03-17 NOTE — Progress Notes (Signed)
PULMONARY / CRITICAL CARE MEDICINE   Name: PARISS REDLICH MRN: [id number] DOB: 08-13-1941    ADMISSION DATE:  03/13/2014   REFERRING MD : Duke Salvia ed PRIMARY SERVICE: PCCM  CHIEF COMPLAINT:  pea arrest  BRIEF PATIENT DESCRIPTION:  73 yo WF with ESRD with HD via left ij tunneled diatek cath who was on hr way to HD 5/11 in [facility] and had a PEA ARREST and was treated with EPI and CPR with ROSC. Taken to [facility] ED and intubated and then transferred to [facility]. Originally taken to MICU but will be transferred to CCU  For hypothermia protocol. She is intubated , not on pressor support currently.  SIGNIFICANT EVENTS / STUDIES:  5/11 pea arrest 5/11 CTA >>> no PE, b/l pleural effusions  LINES / TUBES: 5/11 ETT>> 5/11 Left Fem CVL>>5/11 5/11 R [place] TLC>>> 5/11 L radial a-line>>>  CULTURES: Urine 5/11>>>CITROBACTER FREUNDII  ANTIBIOTICS: Cipro 5/14>>>  SUBJECTIVE: Citrobacter in urine, likely a colonizer.  VITAL SIGNS: Temp:  [98.3 F (36.8 C)-99.6 F (37.6 C)] 98.3 F (36.8 C) (05/15 0734) Pulse Rate:  [78-97] 84 (05/15 0700) Resp:  [22-30] 30 (05/15 0700) BP: (110-161)/(32-52) 131/45 mmHg (05/15 0700) SpO2:  [83 %-100 %] 96 % (05/15 0700) Arterial Line BP: (98-176)/(36-92) 122/44 mmHg (05/15 0700) FiO2 (%):  [40 %-50 %] 40 % (05/15 0734) Weight:  [155 lb 6.8 oz (70.5 kg)] 155 lb 6.8 oz (70.5 kg) (05/15 0445)  HEMODYNAMICS:   VENTILATOR SETTINGS: Vent Mode:  [-] PRVC FiO2 (%):  [40 %-50 %] 40 % Set Rate:  [30 bmp] 30 bmp Vt Set:  [420 mL] 420 mL PEEP:  [5 cmH20] 5 cmH20 Plateau Pressure:  [19 cmH20-22 cmH20] 19 cmH20  INTAKE / OUTPUT: Intake/Output     05/14 0701 - 05/15 0700 05/15 0701 - 05/16 0700   I.V. (mL/kg) 328.3 (4.7)    NG/GT 1130    IV Piggyback 200    Total Intake(mL/kg) 1658.3 (23.5)    Urine (mL/kg/hr) 250 (0.1)    Total Output 250     Net +1408.3          Stool Occurrence 4 x     PHYSICAL EXAMINATION: General: Chronically  ill appearing female, awake and following commands. Neuro: Moves all ext to command. HEENT: OGT. OTT, No JVD Cardiovascular: RRR, no M/R/G. Lungs: CTA bilaterally, no W/R/R. Abdomen:  Soft, NT, ND and +BS. Musculoskeletal:  Intact Skin: cool  LABS:  CBC  Recent Labs Lab 03/15/14 0400 03/16/14 0430 03/17/14 0449  WBC 13.6* 10.9* 10.4  HGB 10.2* 9.0* 9.5*  HCT 32.5* 28.6* 30.3*  PLT 257 208 216   Coag's  Recent Labs Lab 03/13/14 1255  APTT 34  INR 1.07   BMET  Recent Labs Lab 03/15/14 0400 03/16/14 0430 03/17/14 0449  NA 137 137 132*  K 3.4* 3.4* 3.5*  CL 95* 97 93*  CO2 26 26 24   BUN 29* 49* 69*  CREATININE 2.66* 3.92* 4.56*  GLUCOSE 243* 197* 197*   Electrolytes  Recent Labs Lab 03/15/14 0400 03/16/14 0430 03/17/14 0449  CALCIUM 8.4 8.3* 8.5  MG 1.7 2.0 1.9  PHOS 2.6 3.1 3.1   Sepsis Markers  Recent Labs Lab 03/11/14 2237  LATICACIDVEN 0.49*   ABG  Recent Labs Lab 03/15/14 0406 03/16/14 0400 03/17/14 0400  PHART 7.428 7.421 7.425  PCO2ART 41.7 42.9 39.0  PO2ART 67.6* 59.2* 52.0*   Liver Enzymes  Recent Labs Lab 03/11/14 2225  AST 29  ALT  28  ALKPHOS 164*  BILITOT 0.3  ALBUMIN 3.4*   Cardiac Enzymes  Recent Labs Lab 03/13/14 1416 03/13/14 2300 03/14/14 0400  TROPONINI <0.30 <0.30 <0.30   Glucose  Recent Labs Lab 03/16/14 1127 03/16/14 1620 03/16/14 2011 03/17/14 0001 03/17/14 0443 03/17/14 0753  GLUCAP 194* 147* 204* 144* 192* 211*    Imaging Dg Chest Port 1 View  03/17/2014   CLINICAL DATA:  Evaluate endotracheal tube  EXAM: PORTABLE CHEST - 1 VIEW  COMPARISON:  DG CHEST 1V PORT dated 03/16/2014; DG CHEST 1V PORT dated 03/14/2014; DG CHEST 1V PORT dated 03/13/2014; CT ANGIO CHEST W/CM &/OR WO/CM dated 03/13/2014  FINDINGS: Grossly unchanged cardiac silhouette and mediastinal contours with atherosclerotic plaque within the thoracic aorta. Stable position of support apparatus. No pneumothorax. Pulmonary  vasculature appears less distinct in the present examination with cephalization of flow. Suspected worsening of at least small layering bilateral effusions with associated worsening bilateral mid and lower lung heterogeneous opacities unchanged bones.  IMPRESSION: 1.  Stable positioning of support apparatus.  No pneumothorax. 2. Findings suggestive of worsening pulmonary edema with interval increase in (at least) small sized layering bilateral effusions with associated worsening bilateral mid and lower lung atelectasis.   Electronically Signed   By: Simonne ComeJohn  Watts M.D.   On: 03/17/2014 07:24   Dg Chest Port 1 View  03/16/2014   CLINICAL DATA:  Assess endotracheal tube  EXAM: PORTABLE CHEST - 1 VIEW  COMPARISON:  DG CHEST 1V PORT dated 03/15/2014; DG CHEST 1V PORT dated 03/14/2014; CT ANGIO CHEST W/CM &/OR WO/CM dated 03/13/2014  FINDINGS: No change endotracheal tube again about 4.7 cm above the carinal. Although lines and tubes are also unchanged.  Cardiac enlargement with vascular congestion. Mild diffuse interstitial prominence with more focal consolidation right lower lobe. Hazy bibasilar densities suggesting pleural effusions, right greater than left.  IMPRESSION: Congestive heart failure with pulmonary edema, with improved aeration at the left base and no significant change on the right.   Electronically Signed   By: Esperanza Heiraymond  Rubner M.D.   On: 03/16/2014 07:21   ASSESSMENT / PLAN:  PULMONARY A:  VDRF secondary to PEA arrest COPD, severe hypoxemia - attempted to wean to 40% overnight; however, SpO2 dropped into 80's. P:   - Decrease PEEP down to 5 and FiO2 to 40% and titrate for sats. - Begin PS trials. - Have been trying to get a hold of family for two days now for consent for a thora and a meeting for plan of care with no success, no call backs. - HD today and will attempt to extubate. - Need a thoracentesis but unable to get consent from family.  CARDIOVASCULAR A:  Post PEA  arrest CAF HTN CAD CHF ST segment changes overnight P:  - Cards signed off, no further change in plan. - Will attempt to maintain off pressors today unless BP drops with dialysis.  RENAL A:   ESRD P:   - HD today, hopefully . - BMP in AM. - Replace electrolytes as indicated.  GASTROINTESTINAL A:   GI protection. Nutrition. P:   - SUP: Pantoprazole. - TF per nutrition.  HEMATOLOGIC A:   No acute issue P:  - Follow CBC. - Transfuse if <8 given CAD.  INFECTIOUS A:   Mild leukocytosis with tachycardia - ?early infectious etiology. P:   - Monitor WBC / fever curves.  ENDOCRINE A:   DM  P:   - SSI. - TF.  NEUROLOGIC A:  Decreased LOC post  PEA arrest - resolving. CT head 5/11 neg P:   - Minimize sedation, has had no sedation x24 hours now. - Monitor mental status.  HD today, hopefully less O2 demand and can consider extubation then converse with patient regarding plan of care given the fact that I have not been able to get a hold of family x2 days now.  CC time 35 min.  [name], M.D. [facility] Pulmonary/Critical Care Medicine. Pager: [phone]. After hours pager: [phone].

## 2014-03-17 NOTE — Progress Notes (Signed)
Placed pt back in full support due to inc WOB and dec sPO2 78-80% RT to monitor

## 2014-03-17 NOTE — Procedures (Signed)
I have seen and examined this patient and agree with the plan of care . Seen on dialysis stable treatment [name] 03/17/2014, 1:15 PM

## 2014-03-18 ENCOUNTER — Inpatient Hospital Stay (HOSPITAL_COMMUNITY): Payer: Medicare Other

## 2014-03-18 DIAGNOSIS — E1129 Type 2 diabetes mellitus with other diabetic kidney complication: Secondary | ICD-10-CM

## 2014-03-18 LAB — BLOOD GAS, ARTERIAL
Acid-Base Excess: 4.9 mmol/L — ABNORMAL HIGH (ref 0.0–2.0)
BICARBONATE: 28.4 meq/L — AB (ref 20.0–24.0)
Drawn by: 13898
FIO2: 0.4 %
MECHVT: 420 mL
O2 Saturation: 92.3 %
PCO2 ART: 38.6 mmHg (ref 35.0–45.0)
PEEP/CPAP: 5 cmH2O
PH ART: 7.48 — AB (ref 7.350–7.450)
Patient temperature: 99
RATE: 30 resp/min
TCO2: 29.5 mmol/L (ref 0–100)
pO2, Arterial: 60.3 mmHg — ABNORMAL LOW (ref 80.0–100.0)

## 2014-03-18 LAB — CBC
HCT: 30.6 % — ABNORMAL LOW (ref 36.0–46.0)
Hemoglobin: 9.6 g/dL — ABNORMAL LOW (ref 12.0–15.0)
MCH: 29.4 pg (ref 26.0–34.0)
MCHC: 31.4 g/dL (ref 30.0–36.0)
MCV: 93.9 fL (ref 78.0–100.0)
PLATELETS: 210 10*3/uL (ref 150–400)
RBC: 3.26 MIL/uL — AB (ref 3.87–5.11)
RDW: 17.4 % — AB (ref 11.5–15.5)
WBC: 9.2 10*3/uL (ref 4.0–10.5)

## 2014-03-18 LAB — BASIC METABOLIC PANEL
BUN: 42 mg/dL — ABNORMAL HIGH (ref 6–23)
CO2: 26 meq/L (ref 19–32)
CREATININE: 3.1 mg/dL — AB (ref 0.50–1.10)
Calcium: 8.6 mg/dL (ref 8.4–10.5)
Chloride: 98 mEq/L (ref 96–112)
GFR, EST AFRICAN AMERICAN: 16 mL/min — AB (ref >90)
GFR, EST NON AFRICAN AMERICAN: 14 mL/min — AB (ref >90)
Glucose, Bld: 141 mg/dL — ABNORMAL HIGH (ref 70–99)
Potassium: 3.8 mEq/L (ref 3.7–5.3)
SODIUM: 139 meq/L (ref 137–147)

## 2014-03-18 LAB — MAGNESIUM: Magnesium: 2 mg/dL (ref 1.5–2.5)

## 2014-03-18 LAB — GLUCOSE, CAPILLARY
GLUCOSE-CAPILLARY: 143 mg/dL — AB (ref 70–99)
GLUCOSE-CAPILLARY: 162 mg/dL — AB (ref 70–99)
Glucose-Capillary: 159 mg/dL — ABNORMAL HIGH (ref 70–99)
Glucose-Capillary: 192 mg/dL — ABNORMAL HIGH (ref 70–99)
Glucose-Capillary: 190 mg/dL — ABNORMAL HIGH (ref 70–99)

## 2014-03-18 LAB — PHOSPHORUS: PHOSPHORUS: 1.9 mg/dL — AB (ref 2.3–4.6)

## 2014-03-18 MED ORDER — LIDOCAINE-PRILOCAINE 2.5-2.5 % EX CREA: 1.0000 "application " | TOPICAL_CREAM | CUTANEOUS | Status: DC | PRN

## 2014-03-18 MED ORDER — PENTAFLUOROPROP-TETRAFLUOROETH EX AERO: 1.0000 "application " | INHALATION_SPRAY | CUTANEOUS | Status: DC | PRN

## 2014-03-18 MED ORDER — ALTEPLASE 2 MG IJ SOLR
2.0000 mg | Freq: Once | INTRAMUSCULAR | Status: AC | PRN
Start: 2014-03-18 — End: 2014-03-18
  Filled 2014-03-18: qty 2

## 2014-03-18 MED ORDER — NEPRO/CARBSTEADY PO LIQD: 237.0000 mL | ORAL | Status: DC | PRN

## 2014-03-18 MED ORDER — HEPARIN SODIUM (PORCINE) 1000 UNIT/ML DIALYSIS: 1000.0000 [IU] | INTRAMUSCULAR | Status: DC | PRN

## 2014-03-18 MED ORDER — SODIUM CHLORIDE 0.9 % IV SOLN: 100.0000 mL | INTRAVENOUS | Status: DC | PRN

## 2014-03-18 MED ORDER — LIDOCAINE HCL (PF) 1 % IJ SOLN: 5.0000 mL | INTRAMUSCULAR | Status: DC | PRN

## 2014-03-18 NOTE — Progress Notes (Signed)
[name]   Subjective:   Interval History: intubated  Tolerated some ultrafiltration yesterday . Discussed with Dr Waynetta Sandy  He is hoping to wean and extubate and would like additional dialysis performed this weekend  Objective:  Vital signs in last 24 hours:  Temp:  [97.7 F (36.5 C)-99.8 F (37.7 C)] 99.8 F (37.7 C) (05/16 0800) Pulse Rate:  [82-114] 114 (05/16 1000) Resp:  [18-30] 24 (05/16 1000) BP: (92-170)/(35-77) 144/77 mmHg (05/16 1000) SpO2:  [85 %-100 %] 86 % (05/16 1000) Arterial Line BP: (69-163)/(34-130) 147/63 mmHg (05/16 1000) FiO2 (%):  [40 %] 40 % (05/16 1050) Weight:  [66.1 kg (145 lb 11.6 oz)-66.6 kg (146 lb 13.2 oz)] 66.6 kg (146 lb 13.2 oz) (05/16 0439)  Weight change: 0 kg (0 lb) Filed Weights   03/17/14 1100 03/17/14 1515 03/18/14 0439  Weight: 70.5 kg (155 lb 6.8 oz) 66.1 kg (145 lb 11.6 oz) 66.6 kg (146 lb 13.2 oz)    Intake/Output: I/O last 3 completed shifts: In: 2125.8 [I.V.:435.8; NG/GT:1490; IV Piggyback:200] Out: 4150 [Urine:250; Other:3900]   Intake/Output this shift:  Total I/O In: 295 [I.V.:35; NG/GT:260] Out: 100 [Urine:100]  General: Chronically ill  Neuro: awake and alert appears to have no deficits HEENT: intubated  Cardiovascular: RRR,  Lungs: CTA bilaterally,nechanical ventilator Abdomen: Soft non distended     Basic Metabolic Panel:  Recent Labs Lab 03/14/14 0450 03/15/14 0400 03/16/14 0430 03/17/14 0449 03/18/14 0500  NA 137 137 137 132* 139  K 4.2 3.4* 3.4* 3.5* 3.8  CL 97 95* 97 93* 98  CO2 24 26 26 24 26   GLUCOSE 82 243* 197* 197* 141*  BUN 44* 29* 49* 69* 42*  CREATININE 3.70* 2.66* 3.92* 4.56* 3.10*  CALCIUM 8.2* 8.4 8.3* 8.5 8.6  MG 1.8 1.7 2.0 1.9 2.0  PHOS 3.5 2.6 3.1 3.1 1.9*    Liver Function Tests:  Recent Labs Lab 03/11/14 2225  AST 29  ALT 28  ALKPHOS 164*  BILITOT 0.3  PROT 6.9  ALBUMIN 3.4*   No results found for this basename: LIPASE, AMYLASE,  in the  last 168 hours No results found for this basename: AMMONIA,  in the last 168 hours  CBC:  Recent Labs Lab 03/11/14 2225  03/14/14 0450 03/15/14 0400 03/16/14 0430 03/17/14 0449 03/18/14 0500  WBC 10.7*  --  10.9* 13.6* 10.9* 10.4 9.2  NEUTROABS 8.5*  --   --   --   --   --   --   HGB 11.6*  < > 10.3* 10.2* 9.0* 9.5* 9.6*  HCT 37.8  < > 32.2* 32.5* 28.6* 30.3* 30.6*  MCV 95.2  --  92.3 93.7 93.8 93.2 93.9  PLT 250  --  226 257 208 216 210  < > = values in this interval not displayed.  Cardiac Enzymes:  Recent Labs Lab 03/13/14 1416 03/13/14 2300 03/14/14 0400  TROPONINI <0.30 <0.30 <0.30    BNP: No components found with this basename: POCBNP,   CBG:  Recent Labs Lab 03/17/14 1647 03/17/14 2117 03/17/14 2347 03/18/14 0444 03/18/14 0809  GLUCAP 207* 215* 159* 143* 162*    Microbiology: Results for orders placed during the hospital encounter of 03/13/14  MRSA PCR SCREENING     Status: None   Collection Time    03/13/14 12:58 PM      Result Value Ref Range Status   MRSA by PCR NEGATIVE  NEGATIVE Final   Comment:  The GeneXpert MRSA Assay (FDA     approved for NASAL specimens     only), is one component of a     comprehensive MRSA colonization     surveillance program. It is not     intended to diagnose MRSA     infection nor to guide or     monitor treatment for     MRSA infections.  URINE CULTURE     Status: None   Collection Time    03/13/14 12:58 PM      Result Value Ref Range Status   Specimen Description URINE, CATHETERIZED   Final   Special Requests NONE   Final   Culture  Setup Time     Final   Value: 03/13/2014 14:12     Performed at [facility]   Colony Count     Final   Value: >=100,000 COLONIES/ML     Performed at [facility]   Culture     Final   Value: CITROBACTER FREUNDII     Performed at [facility]   Report Status 03/16/2014 FINAL   Final   Organism ID, Bacteria CITROBACTER FREUNDII   Final     Coagulation Studies: No results found for this basename: LABPROT, INR,  in the last 72 hours  Urinalysis: No results found for this basename: COLORURINE, APPERANCEUR, LABSPEC, PHURINE, GLUCOSEU, HGBUR, BILIRUBINUR, KETONESUR, PROTEINUR, UROBILINOGEN, NITRITE, LEUKOCYTESUR,  in the last 72 hours    Imaging: Dg Chest Port 1 View  03/18/2014   CLINICAL DATA:  Endotracheal tube  EXAM: PORTABLE CHEST - 1 VIEW  COMPARISON:  03/17/2014  FINDINGS: Endotracheal tube ends in the mid thoracic trachea. An orogastric tube crosses the diaphragm. Right subclavian central line and left IJ split tip catheter are in stable position, located at the region of the upper cavoatrial junction.  Normal heart size and stable upper mediastinal contours. Significantly improved pulmonary edema. Haziness of the lower chest consistent with persistent pleural effusions and probable associated atelectasis. No pneumothorax.  IMPRESSION: 1. Well-positioned tubes and lines. 2. Improved pulmonary edema. 3. Small bilateral pleural effusion.   Electronically Signed   By: Tiburcio PeaJonathan  Watts M.D.   On: 03/18/2014 07:51   Dg Chest Port 1 View  03/17/2014   CLINICAL DATA:  Evaluate endotracheal tube  EXAM: PORTABLE CHEST - 1 VIEW  COMPARISON:  DG CHEST 1V PORT dated 03/16/2014; DG CHEST 1V PORT dated 03/14/2014; DG CHEST 1V PORT dated 03/13/2014; CT ANGIO CHEST W/CM &/OR WO/CM dated 03/13/2014  FINDINGS: Grossly unchanged cardiac silhouette and mediastinal contours with atherosclerotic plaque within the thoracic aorta. Stable position of support apparatus. No pneumothorax. Pulmonary vasculature appears less distinct in the present examination with cephalization of flow. Suspected worsening of at least small layering bilateral effusions with associated worsening bilateral mid and lower lung heterogeneous opacities unchanged bones.  IMPRESSION: 1.  Stable positioning of support apparatus.  No pneumothorax. 2. Findings suggestive of worsening  pulmonary edema with interval increase in (at least) small sized layering bilateral effusions with associated worsening bilateral mid and lower lung atelectasis.   Electronically Signed   By: Simonne ComeJohn  Watts M.D.   On: 03/17/2014 07:24     Medications:   . sodium chloride 10 mL/hr at 03/17/14 2000  . feeding supplement (NEPRO CARB STEADY) 1,000 mL (03/17/14 2127)  . fentaNYL infusion INTRAVENOUS 50 mcg/hr (03/18/14 1047)  . norepinephrine (LEVOPHED) Adult infusion Stopped (03/16/14 0805)   . antiseptic oral rinse  15 mL Mouth Rinse  QID  . aspirin  81 mg Oral Daily  . atorvastatin  40 mg Per Tube q1800  . chlorhexidine  15 mL Mouth Rinse BID  . ciprofloxacin  400 mg Intravenous Q24H  . feeding supplement (PRO-STAT SUGAR FREE 64)  30 mL Per Tube BID  . heparin subcutaneous  5,000 Units Subcutaneous 3 times per day  . insulin aspart  0-9 Units Subcutaneous 6 times per day  . levothyroxine  25 mcg Per Tube QAC breakfast  . mupirocin cream   Topical Daily  . pantoprazole sodium  40 mg Per Tube Daily   sodium chloride, sodium chloride, acetaminophen (TYLENOL) oral liquid 160 mg/5 mL, feeding supplement (NEPRO CARB STEADY), fentaNYL, heparin, lidocaine (PF), lidocaine-prilocaine, midazolam, pentafluoroprop-tetrafluoroeth  Assessment/ Plan:  73 yo WF with ESRD with HD via left ij tunneled diatek cath who was on hr way to HD 5/11 in [facility] and had a PEA ARREST and was treated with EPI and CPR   1. ESRD MWF  Will plan extra dialysis today as discussed with Dr Delford Field 2. Shock resolved 3.S/p PEA arrest--unclear etiology  4.COPD On chronic [place] O2 at home ~3L.  5.Acute on chronic systolic HF with EF 30-35%  6. Anemia stable   We shall try to assist weaning process with extra dialysis today      LOS: 5 [name] [name] @TODAY @11 :16 AM

## 2014-03-18 NOTE — Progress Notes (Signed)
Pt attempted to extubate self again today.  Pt seems to understand consequences if she was to do so.  Fentanyl drip restarted at low dose @ 73mcg/hr for comfort.  Also, pt very tearful, crying, pointing to tube and wanting it out.  Versed 2mg  IV given x1 prn.  Pt weaned CPAP/PS 5/5 today for about 3 hours, then became very tachypneic, dropping sats to low 80's, and looking gray.  Will discuss with family when they arrive today.  HD RN at bedside now to start treatment.  Report given. Will monitor closely.

## 2014-03-18 NOTE — Progress Notes (Addendum)
PULMONARY / CRITICAL CARE MEDICINE   Name: [name] [name] MRN: [id number] DOB: 01/19/1941    ADMISSION DATE:  03/13/2014   REFERRING MD : Duke Salviaandolph ed PRIMARY SERVICE: PCCM  CHIEF COMPLAINT:  pea arrest  BRIEF PATIENT DESCRIPTION:  73 yo WF with ESRD with HD via left ij tunneled diatek cath [place] and had [place] AR[place] and was treated with EPI and CPR with ROSC. [place] [facility] [place] and [facility]. Originally taken to MICU but will be transferred to CCU  For hypothermia protocol. She is intubated , not on pressor support currently.  SIGNIFICANT EVENTS / STUDIES:  5/11 pea arrest 5/11 CTA >>> no PE, b/l pleural effusions  LINES / TUBES: 5/11 ETT>> 5/11 Left Fem CVL>>5/11 5/11 R [place] TLC>>> 5/11 L radial a-line>>>  CULTURES: Urine 5/11>>>CITROBACTER FREUNDII  ANTIBIOTICS: Cipro 5/14>>>  SUBJECTIVE:  Wants ETT out  VITAL SIGNS: Temp:  [97.7 F (36.5 C)-99.4 F (37.4 C)] 98.9 F (37.2 C) (05/16 0400) Pulse Rate:  [82-115] 83 (05/16 0700) Resp:  [18-30] 30 (05/16 0700) BP: (92-170)/(35-80) 129/58 mmHg (05/16 0700) SpO2:  [87 %-100 %] 96 % (05/16 0700) Arterial Line BP: (69-171)/(34-130) 140/54 mmHg (05/16 0700) FiO2 (%):  [40 %-45 %] 40 % (05/16 0600) Weight:  [66.1 kg (145 lb 11.6 oz)-70.5 kg (155 lb 6.8 oz)] 66.6 kg (146 lb 13.2 oz) (05/16 0439)  HEMODYNAMICS: CV stable   VENTILATOR SETTINGS: Vent Mode:  [-] PRVC FiO2 (%):  [40 %-45 %] 40 % Set Rate:  [30 bmp] 30 bmp Vt Set:  [420 mL] 420 mL PEEP:  [5 cmH20] 5 cmH20 Pressure Support:  [5 cmH20] 5 cmH20 Plateau Pressure:  [17 cmH20-21 cmH20] 20 cmH20  INTAKE / OUTPUT: Intake/Output     05/15 0701 - 05/16 0700 05/16 0701 - 05/17 0700   I.V. (mL/kg) 240 (3.6)    NG/GT 930    IV Piggyback 200    Total Intake(mL/kg) 1370 (20.6)    Urine (mL/kg/hr) 100 (0.1)    Other 3900 (2.4)    Total Output 4000     Net -2630          Stool  Occurrence 1 x     PHYSICAL EXAMINATION: General: Chronically ill appearing female, awake and following commands. Neuro: Moves all ext to command. HEENT: OGT. OTT, No JVD Cardiovascular: RRR, no M/R/G. Lungs: CTA bilaterally, no W/R/R. Abdomen:  Soft, NT, ND and +BS. Musculoskeletal:  Intact Skin: cool  LABS:  CBC  Recent Labs Lab 03/16/14 0430 03/17/14 0449 03/18/14 0500  WBC 10.9* 10.4 9.2  HGB 9.0* 9.5* 9.6*  HCT 28.6* 30.3* 30.6*  PLT 208 216 210   Coag's  Recent Labs Lab 03/13/14 1255  APTT 34  INR 1.07   BMET  Recent Labs Lab 03/16/14 0430 03/17/14 0449 03/18/14 0500  NA 137 132* 139  K 3.4* 3.5* 3.8  CL 97 93* 98  CO2 26 24 26   BUN 49* 69* 42*  CREATININE 3.92* 4.56* 3.10*  GLUCOSE 197* 197* 141*   Electrolytes  Recent Labs Lab 03/16/14 0430 03/17/14 0449 03/18/14 0500  CALCIUM 8.3* 8.5 8.6  MG 2.0 1.9 2.0  PHOS 3.1 3.1 1.9*   Sepsis Markers  Recent Labs Lab 03/11/14 2237  LATICACIDVEN 0.49*   ABG  Recent Labs Lab 03/16/14 0400 03/17/14 0400 03/18/14 0356  PHART 7.421 7.425 7.480*  PCO2ART 42.9 39.0 38.6  PO2ART  59.2* 52.0* 60.3*   Liver Enzymes  Recent Labs Lab 03/11/14 2225  AST 29  ALT 28  ALKPHOS 164*  BILITOT 0.3  ALBUMIN 3.4*   Cardiac Enzymes  Recent Labs Lab 03/13/14 1416 03/13/14 2300 03/14/14 0400  TROPONINI <0.30 <0.30 <0.30   Glucose  Recent Labs Lab 03/17/14 0001 03/17/14 0443 03/17/14 0753 03/17/14 1241 03/17/14 1647 03/17/14 2117  GLUCAP 144* 192* 211* 130* 207* 215*    Imaging Dg Chest Port 1 View  03/17/2014   CLINICAL DATA:  Evaluate endotracheal tube  EXAM: PORTABLE CHEST - 1 VIEW  COMPARISON:  DG CHEST 1V PORT dated 03/16/2014; DG CHEST 1V PORT dated 03/14/2014; DG CHEST 1V PORT dated 03/13/2014; CT ANGIO CHEST W/CM &/OR WO/CM dated 03/13/2014  FINDINGS: Grossly unchanged cardiac silhouette and mediastinal contours with atherosclerotic plaque within the thoracic aorta. Stable  position of support apparatus. No pneumothorax. Pulmonary vasculature appears less distinct in the present examination with cephalization of flow. Suspected worsening of at least small layering bilateral effusions with associated worsening bilateral mid and lower lung heterogeneous opacities unchanged bones.  IMPRESSION: 1.  Stable positioning of support apparatus.  No pneumothorax. 2. Findings suggestive of worsening pulmonary edema with interval increase in (at least) small sized layering bilateral effusions with associated worsening bilateral mid and lower lung atelectasis.   Electronically Signed   By: Simonne Come M.D.   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 - cont  PS trials. -less effusion on R so no thora needed -hypoxemia limiting extubation attempts  CARDIOVASCULAR A:  Post PEA arrest CAF HTN CAD CHF ST segment changes overnight P:  - Cards signed off, no further change in plan. - Will attempt to maintain off pressors today   RENAL A:   ESRD P:   - >?more HD today? Per renal - BMP in AM. - Replace electrolytes as indicated.  GASTROINTESTINAL A:   GI protection. Nutrition. P:   - SUP: Pantoprazole. - TF per nutrition.  HEMATOLOGIC A:   No acute issue P:  - Follow CBC. - Transfuse if <8 given CAD.  INFECTIOUS A:   Mild leukocytosis with tachycardia - ?early infectious  etiology. Citrobacter UTI  P:   - Monitor WBC / fever curves. -on cipro  ENDOCRINE A:   DM  P:   - SSI. - TF.  NEUROLOGIC A:  Decreased LOC post PEA arrest - resolved. CT head 5/11 neg P:   - Minimize sedation, has had no sedation x24 hours now. - Monitor mental status.  Call daughter this am.  PSV cycle.  Poss extubation if passes SBT Poss HD again   CC time 35 min.  Charlcie Cradle WrightMD [facility]. Beeper  [phone]  Cell  [phone]  If no response or cell goes to voicemail, call beeper [phone] 7:44 AM 03/18/2014

## 2014-03-19 LAB — GLUCOSE, CAPILLARY
GLUCOSE-CAPILLARY: 126 mg/dL — AB (ref 70–99)
GLUCOSE-CAPILLARY: 117 mg/dL — AB (ref 70–99)
Glucose-Capillary: 148 mg/dL — ABNORMAL HIGH (ref 70–99)
Glucose-Capillary: 246 mg/dL — ABNORMAL HIGH (ref 70–99)
Glucose-Capillary: 156 mg/dL — ABNORMAL HIGH (ref 70–99)
Glucose-Capillary: 158 mg/dL — ABNORMAL HIGH (ref 70–99)

## 2014-03-19 LAB — CBC
HCT: 31.8 % — ABNORMAL LOW (ref 36.0–46.0)
HEMOGLOBIN: 10 g/dL — AB (ref 12.0–15.0)
MCH: 29.7 pg (ref 26.0–34.0)
MCHC: 31.4 g/dL (ref 30.0–36.0)
MCV: 94.4 fL (ref 78.0–100.0)
PLATELETS: 261 10*3/uL (ref 150–400)
RBC: 3.37 MIL/uL — AB (ref 3.87–5.11)
RDW: 17.3 % — ABNORMAL HIGH (ref 11.5–15.5)
WBC: 9.8 10*3/uL (ref 4.0–10.5)

## 2014-03-19 LAB — BASIC METABOLIC PANEL
BUN: 29 mg/dL — ABNORMAL HIGH (ref 6–23)
CO2: 27 mEq/L (ref 19–32)
Calcium: 8.6 mg/dL (ref 8.4–10.5)
Chloride: 96 mEq/L (ref 96–112)
Creatinine, Ser: 2.22 mg/dL — ABNORMAL HIGH (ref 0.50–1.10)
GFR calc Af Amer: 25 mL/min — ABNORMAL LOW (ref >90)
GFR, EST NON AFRICAN AMERICAN: 21 mL/min — AB (ref >90)
GLUCOSE: 260 mg/dL — AB (ref 70–99)
POTASSIUM: 3.6 meq/L — AB (ref 3.7–5.3)
SODIUM: 138 meq/L (ref 137–147)

## 2014-03-19 NOTE — Progress Notes (Signed)
PULMONARY / CRITICAL CARE MEDICINE   Name: [name] [name] MRN: [id number] DOB: [birth year]    ADMISSION DATE:  03/13/2014   REFERRING MD : Duke Salvia ed PRIMARY SERVICE: PCCM  CHIEF COMPLAINT:  pea arrest  BRIEF PATIENT DESCRIPTION:  73 yo WF with ESRD with HD via left ij tunneled diatek cath who was on hr way to HD 5/11 in [facility] and had a PEA ARREST and was treated with EPI and CPR with ROSC. Taken to [facility] ED and intubated and then transferred to [facility]. Originally taken to MICU but will be transferred to CCU  For hypothermia protocol. She is intubated , not on pressor support currently.  SIGNIFICANT EVENTS / STUDIES:  5/11 pea arrest 5/11 CTA >>> no PE, b/l pleural effusions  LINES / TUBES: 5/11 ETT>> 5/11 Left Fem CVL>>5/11 5/11 R [place] TLC>>> 5/11 L radial a-line>>>  CULTURES: Urine 5/11>>>CITROBACTER FREUNDII  ANTIBIOTICS: Cipro 5/14>>>  SUBJECTIVE:  Wants ETT out, multiple attempts to self extubate  VITAL SIGNS: Temp:  [97.5 F (36.4 C)-99.8 F (37.7 C)] 98.9 F (37.2 C) (05/17 0400) Pulse Rate:  [33-114] 84 (05/17 0731) Resp:  [21-30] 30 (05/17 0731) BP: (93-163)/(39-100) 153/54 mmHg (05/17 0700) SpO2:  [85 %-100 %] 96 % (05/17 0731) Arterial Line BP: (101-187)/(45-113) 139/57 mmHg (05/17 0700) FiO2 (%):  [40 %] 40 % (05/17 0731) Weight:  [62.9 kg (138 lb 10.7 oz)-66.9 kg (147 lb 7.8 oz)] 62.9 kg (138 lb 10.7 oz) (05/17 0500)  HEMODYNAMICS: CV stable   VENTILATOR SETTINGS: Vent Mode:  [-] PRVC FiO2 (%):  [40 %] 40 % Set Rate:  [30 bmp] 30 bmp Vt Set:  [420 mL] 420 mL PEEP:  [5 cmH20] 5 cmH20 Pressure Support:  [5 cmH20] 5 cmH20 Plateau Pressure:  [14 cmH20-26 cmH20] 19 cmH20  INTAKE / OUTPUT: Intake/Output     05/16 0701 - 05/17 0700 05/17 0701 - 05/18 0700   I.V. (mL/kg) 334.5 (5.3)    NG/GT 840    IV Piggyback 200    Total Intake(mL/kg) 1374.5 (21.9)    Urine (mL/kg/hr) 125 (0.1)    Other 3304 (2.2)    Stool 2 (0)    Total Output 3431     Net -2056.5          Urine Occurrence 1 x    Stool Occurrence 1 x     PHYSICAL EXAMINATION: General: Chronically ill appearing female, awake and following commands. Neuro: Moves all ext to command. HEENT: OGT. OTT, No JVD Cardiovascular: RRR, no M/R/G. Lungs: CTA bilaterally, no W/R/R. Abdomen:  Soft, NT, ND and +BS. Musculoskeletal:  Intact Skin: cool  LABS:  CBC  Recent Labs Lab 03/17/14 0449 03/18/14 0500 03/19/14 0450  WBC 10.4 9.2 9.8  HGB 9.5* 9.6* 10.0*  HCT 30.3* 30.6* 31.8*  PLT 216 210 261   Coag's  Recent Labs Lab 03/13/14 1255  APTT 34  INR 1.07   BMET  Recent Labs Lab 03/17/14 0449 03/18/14 0500 03/19/14 0450  NA 132* 139 138  K 3.5* 3.8 3.6*  CL 93* 98 96  CO2 24 26 27   BUN 69* 42* 29*  CREATININE 4.56* 3.10* 2.22*  GLUCOSE 197* 141* 260*   Electrolytes  Recent Labs Lab 03/16/14 0430 03/17/14 0449 03/18/14 0500 03/19/14 0450  CALCIUM 8.3* 8.5 8.6 8.6  MG 2.0 1.9 2.0  --   PHOS 3.1 3.1 1.9*  --    Sepsis Markers No results found for this basename: LATICACIDVEN, PROCALCITON, O2SATVEN,  in the last 168 hours ABG  Recent Labs Lab 03/16/14 0400 03/17/14 0400 03/18/14 0356  PHART 7.421 7.425 7.480*  PCO2ART 42.9 39.0 38.6  PO2ART 59.2* 52.0* 60.3*   Liver Enzymes No results found for this basename: AST, ALT, ALKPHOS, BILITOT, ALBUMIN,  in the last 168 hours Cardiac Enzymes  Recent Labs Lab 03/13/14 1416 03/13/14 2300 03/14/14 0400  TROPONINI <0.30 <0.30 <0.30   Glucose  Recent Labs Lab 03/18/14 0809 03/18/14 1206 03/18/14 1513 03/18/14 2104 03/19/14 0012 03/19/14 0459  GLUCAP 162* 192* 190* 126* 148* 246*    Imaging Dg Chest Port 1 View  03/18/2014   CLINICAL DATA:  Endotracheal tube  EXAM: PORTABLE CHEST - 1 VIEW  COMPARISON:  03/17/2014  FINDINGS: Endotracheal tube ends in the mid thoracic trachea. An orogastric tube crosses the diaphragm. Right subclavian central line and left IJ  split tip catheter are in stable position, located at the region of the upper cavoatrial junction.  Normal heart size and stable upper mediastinal contours. Significantly improved pulmonary edema. Haziness of the lower chest consistent with persistent pleural effusions and probable associated atelectasis. No pneumothorax.  IMPRESSION: 1. Well-positioned tubes and lines. 2. Improved pulmonary edema. 3. Small bilateral pleural effusion.   Electronically Signed   By: Tiburcio Pea M.D.   On: 03/18/2014 07:51   Dg Abd Portable 1v  03/19/2014   CLINICAL DATA:  Orogastric tube placement.  EXAM: PORTABLE ABDOMEN - 1 VIEW  COMPARISON:  03/13/2014.  FINDINGS: Normal bowel gas pattern. Orogastric tube tip in the mid stomach. Small bilateral pleural effusions and left basilar atelectasis. Mild scoliosis. Atheromatous arterial calcifications.  IMPRESSION: 1. Orogastric tube tip in the mid stomach. 2. Small bilateral pleural effusions. 3. Left lower lobe atelectasis.   Electronically Signed   By: Gordan Payment M.D.   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    RENAL A:   ESRD P:   - s/p another HD 5/16 .  GASTROINTESTINAL A:   GI protection. Nutrition. P:   - SUP: Pantoprazole. - TF per nutrition. POS if  extubate  HEMATOLOGIC A:   No acute issue P:  - Follow CBC. - Transfuse if <8 given CAD.  INFECTIOUS A:   Mild leukocytosis with tachycardia - ?early infectious etiology. Citrobacter UTI  P:   - Monitor WBC / fever curves. -on cipro  ENDOCRINE A:   DM  P:   - SSI. - TF.>d/c if extubate  NEUROLOGIC A:  Decreased LOC post PEA arrest - resolved. CT head 5/11 neg P:   - Minimize sedation, has had no sedation x24 hours now. - Monitor mental status.  Attempt extubation 5/17.  Family coming in this AM for update  CC time 35 min.  Charlcie Cradle WrightMD  [facility]. Beeper  [phone]  Cell  [phone]  If no response or cell goes to voicemail, call beeper [phone] 7:49 AM 03/19/2014

## 2014-03-19 NOTE — Progress Notes (Signed)
 Fentanyl wasted in sink with witness, Elmer Picker.  Tammy Sours

## 2014-03-19 NOTE — Progress Notes (Signed)
Pt pulled out OG tube in an attempt to extubate herself. She is very angry that she has the ET tube and is begging to have it removed. Pt was educated. Fentynal was continued, and bilateral soft wrist restraints were started.   Gwynneth Macleod RN BSN

## 2014-03-19 NOTE — Progress Notes (Signed)
[facility] ROUNDING NOTE   Subjective:   Interval History: extubated but hoarse  Son [name] [name] and daughter [name] [name] in room and discussed DNR and poor overall prognosis. [name] [name] is going to think about this. She is hoping to go home and resume outpatient dialysis although this may be a challenge especially if full code status.  Objective:  Vital signs in last 24 hours:  Temp:  [97.5 F (36.4 C)-98.9 F (37.2 C)] 98.3 F (36.8 C) (05/17 0800) Pulse Rate:  [33-102] 98 (05/17 1000) Resp:  [19-30] 19 (05/17 1000) BP: (93-163)/(39-100) 139/64 mmHg (05/17 1000) SpO2:  [87 %-100 %] 93 % (05/17 1000) Arterial Line BP: (101-187)/(45-113) 148/59 mmHg (05/17 1000) FiO2 (%):  [40 %] 40 % (05/17 0812) Weight:  [62.9 kg (138 lb 10.7 oz)-66.9 kg (147 lb 7.8 oz)] 62.9 kg (138 lb 10.7 oz) (05/17 0500)  Weight change: -3.6 kg (-7 lb 15 oz) Filed Weights   03/18/14 1600 03/18/14 2000 03/19/14 0500  Weight: 66.9 kg (147 lb 7.8 oz) 63.6 kg (140 lb 3.4 oz) 62.9 kg (138 lb 10.7 oz)    Intake/Output: I/O last 3 completed shifts: In: 2004.5 [I.V.:514.5; NG/GT:1290; IV Piggyback:200] Out: 3431 [Urine:125; HALPF:7902; Stool:2]   Intake/Output this shift:  Total I/O In: 310 [I.V.:10; NG/GT:300] Out: -   General: Chronically ill echymotic Neuro: awake and alert appears to have no deficits  HEENT: hoarse Cardiovascular: RRR,  Lungs: CTA bilaterally Abdomen: Soft non distended    Basic Metabolic Panel:  Recent Labs Lab 03/14/14 0450 03/15/14 0400 03/16/14 0430 03/17/14 0449 03/18/14 0500 03/19/14 0450  NA 137 137 137 132* 139 138  K 4.2 3.4* 3.4* 3.5* 3.8 3.6*  CL 97 95* 97 93* 98 96  CO2 24 26 26 24 26 27   GLUCOSE 82 243* 197* 197* 141* 260*  BUN 44* 29* 49* 69* 42* 29*  CREATININE 3.70* 2.66* 3.92* 4.56* 3.10* 2.22*  CALCIUM 8.2* 8.4 8.3* 8.5 8.6 8.6  MG 1.8 1.7 2.0 1.9 2.0  --   PHOS 3.5 2.6 3.1 3.1 1.9*  --     Liver Function Tests: No results found for this  basename: AST, ALT, ALKPHOS, BILITOT, PROT, ALBUMIN,  in the last 168 hours No results found for this basename: LIPASE, AMYLASE,  in the last 168 hours No results found for this basename: AMMONIA,  in the last 168 hours  CBC:  Recent Labs Lab 03/15/14 0400 03/16/14 0430 03/17/14 0449 03/18/14 0500 03/19/14 0450  WBC 13.6* 10.9* 10.4 9.2 9.8  HGB 10.2* 9.0* 9.5* 9.6* 10.0*  HCT 32.5* 28.6* 30.3* 30.6* 31.8*  MCV 93.7 93.8 93.2 93.9 94.4  PLT 257 208 216 210 261    Cardiac Enzymes:  Recent Labs Lab 03/13/14 1416 03/13/14 2300 03/14/14 0400  TROPONINI <0.30 <0.30 <0.30    BNP: No components found with this basename: POCBNP,   CBG:  Recent Labs Lab 03/18/14 1513 03/18/14 2104 03/19/14 0012 03/19/14 0459 03/19/14 0800  GLUCAP 190* 126* 148* 246* 156*    Microbiology: Results for orders placed during the hospital encounter of 03/13/14  MRSA PCR SCREENING     Status: None   Collection Time    03/13/14 12:58 PM      Result Value Ref Range Status   MRSA by PCR NEGATIVE  NEGATIVE Final   Comment:            The GeneXpert MRSA Assay (FDA     approved for NASAL specimens     only),  is one component of a     comprehensive MRSA colonization     surveillance program. It is not     intended to diagnose MRSA     infection nor to guide or     monitor treatment for     MRSA infections.  URINE CULTURE     Status: None   Collection Time    03/13/14 12:58 PM      Result Value Ref Range Status   Specimen Description URINE, CATHETERIZED   Final   Special Requests NONE   Final   Culture  Setup Time     Final   Value: 03/13/2014 14:12     Performed at [facility] Count     Final   Value: >=100,000 COLONIES/ML     Performed at [facility]   Culture     Final   Value: CITROBACTER FREUNDII     Performed at [facility]   Report Status 03/16/2014 FINAL   Final   Organism ID, Bacteria CITROBACTER FREUNDII   Final    Coagulation  Studies: No results found for this basename: LABPROT, INR,  in the last 72 hours  Urinalysis: No results found for this basename: COLORURINE, APPERANCEUR, LABSPEC, PHURINE, GLUCOSEU, HGBUR, BILIRUBINUR, KETONESUR, PROTEINUR, UROBILINOGEN, NITRITE, LEUKOCYTESUR,  in the last 72 hours    Imaging: Dg Chest Port 1 View  03/18/2014   CLINICAL DATA:  Endotracheal tube  EXAM: PORTABLE CHEST - 1 VIEW  COMPARISON:  03/17/2014  FINDINGS: Endotracheal tube ends in the mid thoracic trachea. An orogastric tube crosses the diaphragm. Right subclavian central line and left IJ split tip catheter are in stable position, located at the region of the upper cavoatrial junction.  Normal heart size and stable upper mediastinal contours. Significantly improved pulmonary edema. Haziness of the lower chest consistent with persistent pleural effusions and probable associated atelectasis. No pneumothorax.  IMPRESSION: 1. Well-positioned tubes and lines. 2. Improved pulmonary edema. 3. Small bilateral pleural effusion.   Electronically Signed   By: Tiburcio Pea M.D.   On: 03/18/2014 07:51   Dg Abd Portable 1v  03/19/2014   CLINICAL DATA:  Orogastric tube placement.  EXAM: PORTABLE ABDOMEN - 1 VIEW  COMPARISON:  03/13/2014.  FINDINGS: Normal bowel gas pattern. Orogastric tube tip in the mid stomach. Small bilateral pleural effusions and left basilar atelectasis. Mild scoliosis. Atheromatous arterial calcifications.  IMPRESSION: 1. Orogastric tube tip in the mid stomach. 2. Small bilateral pleural effusions. 3. Left lower lobe atelectasis.   Electronically Signed   By: Gordan Payment M.D.   On: 03/19/2014 03:49     Medications:   . sodium chloride 10 mL/hr at 03/19/14 0700  . feeding supplement (NEPRO CARB STEADY) Stopped (03/19/14 0800)  . fentaNYL infusion INTRAVENOUS Stopped (03/19/14 0800)   . antiseptic oral rinse  15 mL Mouth Rinse QID  . aspirin  81 mg Oral Daily  . atorvastatin  40 mg Per Tube q1800  .  chlorhexidine  15 mL Mouth Rinse BID  . ciprofloxacin  400 mg Intravenous Q24H  . feeding supplement (PRO-STAT SUGAR FREE 64)  30 mL Per Tube BID  . heparin subcutaneous  5,000 Units Subcutaneous 3 times per day  . insulin aspart  0-9 Units Subcutaneous 6 times per day  . levothyroxine  25 mcg Per Tube QAC breakfast  . mupirocin cream   Topical Daily  . pantoprazole sodium  40 mg Per Tube Daily   sodium chloride,  sodium chloride, acetaminophen (TYLENOL) oral liquid 160 mg/5 mL, feeding supplement (NEPRO CARB STEADY), fentaNYL, heparin, lidocaine (PF), lidocaine-prilocaine, midazolam, pentafluoroprop-tetrafluoroeth  Assessment/ Plan:  73 yo WF with ESRD with HD via left ij tunneled diatek cath who [place] [facility] [place] [facility] and had a PEA ARREST and was treated with EPI and CPR  1. ESRD MWF  2. Shock resolved  3.S/p PEA arrest--unclear etiology  4.COPD On chronic [place] O2 at home ~3L.  5.Acute on chronic systolic HF with EF 30-35%  6. Anemia stable    Continue discussions of DNR and  DNI with patient and family, Recommending DNR/DNI but continuing dialysis for now      LOS: 6 [name] BuddyMartin W Nikesha Kwasny @TODAY @11 :35 AM

## 2014-03-19 NOTE — Progress Notes (Signed)
03/18/14 0733 pt  auto peep is 9 total 14 spoke the day shift nurse. Pt is stable RT will continue to monitor.

## 2014-03-19 NOTE — Progress Notes (Signed)
03/19/14 0953 extubated patient with Marylene Land RN per Dr. Florene Route orders. Patient 6L [place] 92% patient is stable RT will continue to monitor.

## 2014-03-19 NOTE — Procedures (Signed)
Extubation Procedure Note  Patient Details:   Name: [name] [name] DOB: [birth year] MRN: [id number]   Airway Documentation:   patient stable.   Evaluation  O2 sats: stable throughout and currently acceptable Complications: No apparent complications Patient did tolerate procedure well. Bilateral Breath Sounds: Diminished Suctioning: Airway Yes  Elita Quick [facility] 03/19/2014, 10:07 AM Patient extubated Marylene Land RN to 6L [place] per Dr. Delford Field orders. Patient is stable. RT continue to monitor.

## 2014-03-20 ENCOUNTER — Inpatient Hospital Stay (HOSPITAL_COMMUNITY): Payer: Medicare Other

## 2014-03-20 LAB — GLUCOSE, CAPILLARY
GLUCOSE-CAPILLARY: 136 mg/dL — AB (ref 70–99)
GLUCOSE-CAPILLARY: 216 mg/dL — AB (ref 70–99)
GLUCOSE-CAPILLARY: 145 mg/dL — AB (ref 70–99)
Glucose-Capillary: 134 mg/dL — ABNORMAL HIGH (ref 70–99)
Glucose-Capillary: 159 mg/dL — ABNORMAL HIGH (ref 70–99)
Glucose-Capillary: 158 mg/dL — ABNORMAL HIGH (ref 70–99)
Glucose-Capillary: 82 mg/dL (ref 70–99)

## 2014-03-20 LAB — BASIC METABOLIC PANEL
BUN: 43 mg/dL — ABNORMAL HIGH (ref 6–23)
CHLORIDE: 94 meq/L — AB (ref 96–112)
CO2: 27 meq/L (ref 19–32)
Calcium: 9.1 mg/dL (ref 8.4–10.5)
Creatinine, Ser: 2.92 mg/dL — ABNORMAL HIGH (ref 0.50–1.10)
GFR calc Af Amer: 18 mL/min — ABNORMAL LOW (ref >90)
GFR calc non Af Amer: 15 mL/min — ABNORMAL LOW (ref >90)
Glucose, Bld: 169 mg/dL — ABNORMAL HIGH (ref 70–99)
Potassium: 4.4 mEq/L (ref 3.7–5.3)
SODIUM: 136 meq/L — AB (ref 137–147)

## 2014-03-20 LAB — CBC
HEMATOCRIT: 34.8 % — AB (ref 36.0–46.0)
HEMOGLOBIN: 10.8 g/dL — AB (ref 12.0–15.0)
MCH: 29.8 pg (ref 26.0–34.0)
MCHC: 31 g/dL (ref 30.0–36.0)
MCV: 95.9 fL (ref 78.0–100.0)
Platelets: 302 10*3/uL (ref 150–400)
RBC: 3.63 MIL/uL — AB (ref 3.87–5.11)
RDW: 17 % — ABNORMAL HIGH (ref 11.5–15.5)
WBC: 12.4 10*3/uL — ABNORMAL HIGH (ref 4.0–10.5)

## 2014-03-20 MED ORDER — HEPARIN SODIUM (PORCINE) 1000 UNIT/ML DIALYSIS: 1000.0000 [IU] | INTRAMUSCULAR | Status: DC | PRN

## 2014-03-20 MED ORDER — NEPRO/CARBSTEADY PO LIQD: 237.0000 mL | ORAL | Status: DC | PRN

## 2014-03-20 MED ORDER — PENTAFLUOROPROP-TETRAFLUOROETH EX AERO: 1.0000 "application " | INHALATION_SPRAY | CUTANEOUS | Status: DC | PRN

## 2014-03-20 MED ORDER — LIDOCAINE HCL (PF) 1 % IJ SOLN: 5.0000 mL | INTRAMUSCULAR | Status: DC | PRN

## 2014-03-20 MED ORDER — SODIUM CHLORIDE 0.9 % IV SOLN: 100.0000 mL | INTRAVENOUS | Status: DC | PRN

## 2014-03-20 MED ORDER — BIOTENE DRY MOUTH MT LIQD
15.0000 mL | Freq: Two times a day (BID) | OROMUCOSAL | Status: DC
  Administered 2014-03-20 – 2014-03-23 (×7): 15 mL via OROMUCOSAL

## 2014-03-20 MED ORDER — CARVEDILOL 3.125 MG PO TABS
3.1250 mg | ORAL_TABLET | Freq: Two times a day (BID) | ORAL | Status: DC
  Filled 2014-03-20: qty 1

## 2014-03-20 MED ORDER — RENA-VITE PO TABS
1.0000 | ORAL_TABLET | Freq: Every day | ORAL | Status: DC
  Administered 2014-03-20 – 2014-03-22 (×3): 1 via ORAL
  Filled 2014-03-20 (×4): qty 1

## 2014-03-20 MED ORDER — NEPRO/CARBSTEADY PO LIQD
237.0000 mL | Freq: Two times a day (BID) | ORAL | Status: DC
  Administered 2014-03-21 – 2014-03-23 (×4): 237 mL via ORAL
  Filled 2014-03-20 (×9): qty 237

## 2014-03-20 MED ORDER — CARVEDILOL 3.125 MG PO TABS
3.1250 mg | ORAL_TABLET | Freq: Two times a day (BID) | ORAL | Status: DC
  Administered 2014-03-20 – 2014-03-23 (×7): 3.125 mg via ORAL
  Filled 2014-03-20 (×11): qty 1

## 2014-03-20 MED ORDER — ALTEPLASE 2 MG IJ SOLR
2.0000 mg | Freq: Once | INTRAMUSCULAR | Status: DC | PRN
  Filled 2014-03-20: qty 2

## 2014-03-20 MED ORDER — LIDOCAINE-PRILOCAINE 2.5-2.5 % EX CREA: 1.0000 "application " | TOPICAL_CREAM | CUTANEOUS | Status: DC | PRN

## 2014-03-20 NOTE — Progress Notes (Signed)
Subjective:   Sore throat, was extubated yesterday  Objective Filed Vitals:   03/20/14 0500 03/20/14 0600 03/20/14 0700 03/20/14 1130  BP: 145/59 114/47 118/76 126/59  Pulse: 98 89 96 97  Temp:   97.6 F (36.4 C) 97.5 F (36.4 C)  TempSrc:   Oral Oral  Resp: 24 21 18 26   Height:      Weight:      SpO2: 98% 98% 94% 92%   Physical Exam General: ill appearing, no acute distress Heart: RRR Lungs: CTA, unlabored Abdomen: soft, non distended +BS Extremities: no edema Dialysis Access: L IJ cath  Dialysis Orders: MWF @ [facility]  64kgs 2K/2.5Ca+ 4 hr No Heparin L IJ 400/1.5  hectrol 2 mcg IV/HD No epogen or Venofer  Profle 4  Assessment/Plan:  1. PEA arrest- management per primary, cards consulted. heparin gtt. ECHO- EF 50%, CT angio- no PE. Chest xray- pleural effusion. Cards signed off 2. ESRD - MWF @ [facility], HD today, goal 3L K+4.4 3. UTI- per primary, on cipro  4. Hypertension/volume - 126/59, off pressors 5. Anemia - hgb 10.8 No outpt epo or Fe. Watch CBC 6. Metabolic bone disease - Ca+ 9.1, phos 1.9. Cont hectorol q hd 7. Nutrition - renal diet, nepro, multivit 8. DM- per primary 9. CAD  Jetty DuhamelBridget Whelan, NP [facility] [phone] 03/20/2014,12:33 PM  LOS: 7 days   Pt seen, examined and agree w A/P as above. Patient stable on HD. Discussed code status again w pt and she says she wants to "go in peace".  I will d/w family and if they concur change status to DNR.  Vinson Moselleob Porcha Deblanc MD pager [phone]    cell [phone] 03/20/2014, 3:18 PM    Additional Objective Labs: Basic Metabolic Panel:  Recent Labs Lab 03/16/14 0430 03/17/14 0449 03/18/14 0500 03/19/14 0450 03/20/14 0347  NA 137 132* 139 138 136*  K 3.4* 3.5* 3.8 3.6* 4.4  CL 97 93* 98 96 94*  CO2 26 24 26 27 27   GLUCOSE 197* 197* 141* 260* 169*  BUN 49* 69* 42* 29* 43*  CREATININE 3.92* 4.56* 3.10* 2.22* 2.92*  CALCIUM 8.3* 8.5 8.6 8.6 9.1  PHOS 3.1 3.1 1.9*  --   --    Liver Function  Tests: No results found for this basename: AST, ALT, ALKPHOS, BILITOT, PROT, ALBUMIN,  in the last 168 hours No results found for this basename: LIPASE, AMYLASE,  in the last 168 hours CBC:  Recent Labs Lab 03/16/14 0430 03/17/14 0449 03/18/14 0500 03/19/14 0450 03/20/14 0347  WBC 10.9* 10.4 9.2 9.8 12.4*  HGB 9.0* 9.5* 9.6* 10.0* 10.8*  HCT 28.6* 30.3* 30.6* 31.8* 34.8*  MCV 93.8 93.2 93.9 94.4 95.9  PLT 208 216 210 261 302   Blood Culture    Component Value Date/Time   SDES URINE, CATHETERIZED 03/13/2014 1258   SPECREQUEST NONE 03/13/2014 1258   CULT  Value: CITROBACTER FREUNDII Performed at [facility] 03/13/2014 1258   REPTSTATUS 03/16/2014 FINAL 03/13/2014 1258    Cardiac Enzymes:  Recent Labs Lab 03/13/14 1416 03/13/14 2300 03/14/14 0400  TROPONINI <0.30 <0.30 <0.30   CBG:  Recent Labs Lab 03/19/14 2039 03/19/14 2330 03/20/14 0342 03/20/14 0730 03/20/14 1134  GLUCAP 117* 134* 159* 158* 216*   Iron Studies: No results found for this basename: IRON, TIBC, TRANSFERRIN, FERRITIN,  in the last 72 hours @lablastinr3 @ Studies/Results: Dg Chest Port 1 View  03/20/2014   CLINICAL DATA:  Shortness of breath.  Pulmonary edema.  EXAM: PORTABLE CHEST - 1 VIEW  COMPARISON:  DG CHEST 1V PORT dated 03/18/2014  FINDINGS: Interim removal of endotracheal and NG tube. Right IJ catheter, dual-lumen left IJ catheter in stable position. Cardiomegaly with pulmonary vascular prominence diffuse bile pulmonary edema with bilateral pleural effusions, particular on the left noted. No pneumothorax.  IMPRESSION: 1. Interim extubation removal of NG tube. Bilateral IJ lines in stable position. 2. Congestive heart failure with pulmonary edema and bilateral pleural effusions.   Electronically Signed   By: Maisie Fus  Register   On: 03/20/2014 07:40   Dg Abd Portable 1v  03/19/2014   CLINICAL DATA:  Orogastric tube placement.  EXAM: PORTABLE ABDOMEN - 1 VIEW  COMPARISON:  03/13/2014.   FINDINGS: Normal bowel gas pattern. Orogastric tube tip in the mid stomach. Small bilateral pleural effusions and left basilar atelectasis. Mild scoliosis. Atheromatous arterial calcifications.  IMPRESSION: 1. Orogastric tube tip in the mid stomach. 2. Small bilateral pleural effusions. 3. Left lower lobe atelectasis.   Electronically Signed   By: Gordan Payment M.D.   On: 03/19/2014 03:49   Medications: . sodium chloride 10 mL/hr at 03/20/14 1100  . feeding supplement (NEPRO CARB STEADY) Stopped (03/19/14 0800)   . antiseptic oral rinse  15 mL Mouth Rinse BID  . aspirin  81 mg Oral Daily  . atorvastatin  40 mg Per Tube q1800  . carvedilol  3.125 mg Oral BID WC  . ciprofloxacin  400 mg Intravenous Q24H  . feeding supplement (PRO-STAT SUGAR FREE 64)  30 mL Per Tube BID  . heparin subcutaneous  5,000 Units Subcutaneous 3 times per day  . insulin aspart  0-9 Units Subcutaneous 6 times per day  . levothyroxine  25 mcg Per Tube QAC breakfast  . mupirocin cream   Topical Daily  . pantoprazole sodium  40 mg Per Tube Daily

## 2014-03-20 NOTE — Progress Notes (Signed)
eLink Physician-Brief Progress Note Patient Name: [name] [name] DOB: August 23, 1941 MRN: [id number]  Date of Service  03/20/2014   HPI/Events of Note   Mild tachy, BP good BB WD?  eICU Interventions  Add home coreg   Intervention Category Intermediate Interventions: Arrhythmia - evaluation and management  Nelda Bucks 03/20/2014, 1:26 AM

## 2014-03-20 NOTE — Procedures (Signed)
Patient was seen on dialysis and the procedure was supervised.  BFR 400  Via PC BP is  111/57.   Patient appears to be tolerating treatment well  Cecille Aver 03/20/2014

## 2014-03-20 NOTE — Progress Notes (Signed)
PULMONARY / CRITICAL CARE MEDICINE   Name: [name] [name] MRN: [id number] DOB: 11/18/1940    ADMISSION DATE:  03/13/2014   REFERRING MD : [facility] PRIMARY SERVICE: PCCM  CHIEF COMPLAINT:  pea arrest  BRIEF PATIENT DESCRIPTION:  73 yo WF with ESRD with HD via left ij tunneled diatek cath who [place] [facility] and had [place] ARREST and was treated with EPI and CPR with [facility]. [facility] [place] [facility] and [facility]. Originally taken to MICU but will be transferred to CCU  For hypothermia protocol. She is intubated , not on pressor support currently.  SIGNIFICANT EVENTS / STUDIES:  5/11 pea arrest 5/11 CTA >>> no PE, b/l pleural effusions  LINES / TUBES: 5/11 ETT>>517 5/11 Left Fem CVL>>5/11 5/11 R Frio TLC>>> 5/11 L radial a-line>>>517  CULTURES: Urine 5/11>>>CITROBACTER FREUNDII  ANTIBIOTICS: Cipro 5/14>>>  SUBJECTIVE:  No events overnight.  VITAL SIGNS: Temp:  [97.1 F (36.2 C)-99 F (37.2 C)] 97.6 F (36.4 C) (05/18 0700) Pulse Rate:  [89-127] 96 (05/18 0700) Resp:  [18-29] 18 (05/18 0700) BP: (114-168)/(42-92) 118/76 mmHg (05/18 0700) SpO2:  [81 %-100 %] 94 % (05/18 0700) Arterial Line BP: (144-158)/(58-62) 144/58 mmHg (05/17 1300) Weight:  [142 lb 10.2 oz (64.7 kg)] 142 lb 10.2 oz (64.7 kg) (05/18 0349)  HEMODYNAMICS: CV stable   VENTILATOR SETTINGS:    INTAKE / OUTPUT: Intake/Output     05/17 0701 - 05/18 0700 05/18 0701 - 05/19 0700   P.O. 240 240   I.V. (mL/kg) 230 (3.6)    NG/GT 300    IV Piggyback     Total Intake(mL/kg) 770 (11.9) 240 (3.7)   Urine (mL/kg/hr) 200 (0.1) 100 (0.4)   Other     Stool     Total Output 200 100   Net +570 +140         PHYSICAL EXAMINATION: General: Chronically ill appearing female, awake and following commands. Neuro: Moves all ext to command. HEENT: OGT. OTT, No JVD Cardiovascular: RRR, no M/R/G. Lungs: CTA bilaterally, no W/R/R. Abdomen:  Soft,  NT, ND and +BS. Musculoskeletal:  Intact Skin: cool  LABS:  CBC  Recent Labs Lab 03/18/14 0500 03/19/14 0450 03/20/14 0347  WBC 9.2 9.8 12.4*  HGB 9.6* 10.0* 10.8*  HCT 30.6* 31.8* 34.8*  PLT 210 261 302   Coag's  Recent Labs Lab 03/13/14 1255  APTT 34  INR 1.07   BMET  Recent Labs Lab 03/18/14 0500 03/19/14 0450 03/20/14 0347  NA 139 138 136*  K 3.8 3.6* 4.4  CL 98 96 94*  CO2 26 27 27   BUN 42* 29* 43*  CREATININE 3.10* 2.22* 2.92*  GLUCOSE 141* 260* 169*   Electrolytes  Recent Labs Lab 03/16/14 0430 03/17/14 0449 03/18/14 0500 03/19/14 0450 03/20/14 0347  CALCIUM 8.3* 8.5 8.6 8.6 9.1  MG 2.0 1.9 2.0  --   --   PHOS 3.1 3.1 1.9*  --   --    Sepsis Markers No results found for this basename: LATICACIDVEN, PROCALCITON, O2SATVEN,  in the last 168 hours ABG  Recent Labs Lab 03/16/14 0400 03/17/14 0400 03/18/14 0356  PHART 7.421 7.425 7.480*  PCO2ART 42.9 39.0 38.6  PO2ART 59.2* 52.0* 60.3*   Liver Enzymes No results found for this basename: AST, ALT, ALKPHOS, BILITOT, ALBUMIN,  in the last 168 hours Cardiac Enzymes  Recent Labs Lab 03/13/14 1416 03/13/14 2300 03/14/14 0400  TROPONINI <0.30 <0.30 <0.30   Glucose  Recent Labs Lab 03/19/14 1208 03/19/14 1638 03/19/14 2039 03/19/14 2330 03/20/14 0342 03/20/14 0730  GLUCAP 158* 136* 117* 134* 159* 158*    Imaging Dg Chest Port 1 View  03/20/2014   CLINICAL DATA:  Shortness of breath.  Pulmonary edema.  EXAM: PORTABLE CHEST - 1 VIEW  COMPARISON:  DG CHEST 1V PORT dated 03/18/2014  FINDINGS: Interim removal of endotracheal and NG tube. Right IJ catheter, dual-lumen left IJ catheter in stable position. Cardiomegaly with pulmonary vascular prominence diffuse bile pulmonary edema with bilateral pleural effusions, particular on the left noted. No pneumothorax.  IMPRESSION: 1. Interim extubation removal of NG tube. Bilateral IJ lines in stable position. 2. Congestive heart failure with  pulmonary edema and bilateral pleural effusions.   Electronically Signed   By: Maisie Fus  Register   On: 03/20/2014 07:40   Dg Abd Portable 1v  03/19/2014   CLINICAL DATA:  Orogastric tube placement.  EXAM: PORTABLE ABDOMEN - 1 VIEW  COMPARISON:  03/13/2014.  FINDINGS: Normal bowel gas pattern. Orogastric tube tip in the mid stomach. Small bilateral pleural effusions and left basilar atelectasis. Mild scoliosis. Atheromatous arterial calcifications.  IMPRESSION: 1. Orogastric tube tip in the mid stomach. 2. Small bilateral pleural effusions. 3. Left lower lobe atelectasis.   Electronically Signed   By: Gordan Payment M.D.   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 - Spoke with patient, wishes to be reintubated if fails but holding for now.  CARDIOVASCULAR A:  Post PEA arrest CAF HTN CAD CHF  P:  - Cards signed off, no further change in plan.  RENAL A:   ESRD P:   - HD today in HD unit.  GASTROINTESTINAL A:   GI protection. Nutrition. P:   - SUP: Pantoprazole. - Renal diet.  HEMATOLOGIC A:   No acute issue P:  - Follow CBC. - Transfuse if <8 given CAD.  INFECTIOUS A:   Mild leukocytosis with tachycardia - ?early infectious etiology. Citrobacter UTI  P:   - Monitor WBC / fever curves. - On cipro, will continue for 7 day course.  ENDOCRINE A:   DM  P:   - SSI. - Diabetic  diet.  NEUROLOGIC A:  Decreased LOC post PEA arrest - resolved. CT head 5/11 neg P:   - Minimize sedation, has had no sedation x24 hours now. - Monitor mental status.  Transfer to tele and back to [facility], PCCM will sign off, please call back if needed.  [name], M.D. [facility] Pulmonary/Critical Care Medicine. Pager: [phone]. After hours pager: [phone].

## 2014-03-20 NOTE — Progress Notes (Signed)
NUTRITION FOLLOW UP  Intervention:   1.  General healthful diet; encourage intake of foods and beverages as able.  RD to follow and assess for nutritional adequacy.  2.  Supplements; Nepro Shake po BID, each supplement provides 425 kcal and 19 grams protein  Nutrition Dx:   Inadequate oral intake, ongoing due to recent extubation  Monitor:   1.  Enteral nutrition; initiation with tolerance.  Pt to meet >/=90% estimated needs with nutrition support.  No longer appropriate.  Discontinue.  2.  Wt/wt change; monitor trends. Ongoing.  1.  Food/Beverage; pt meeting >/=90% estimated needs with tolerance.   Assessment:   Pt admitted with cardiac arrest on HD initially requiring intubation.  Pt has been extubated and diet has been advanced to Renal.  Pt not available at time of visit- off unit for HD.  PO intake is ~50% of meals per RN. Pt would likely benefit from nutrition supplement.  Needs re-estimated, RD to order Nepro.   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, non-pitting edema  Diet Order: Renal   Intake/Output Summary (Last 24 hours) at 03/20/14 1235 Last data filed at 03/20/14 0900  Gross per 24 hour  Intake    660 ml  Output    300 ml  Net    360 ml    Last BM: 5/17  Labs:   Recent Labs Lab 03/16/14 0430 03/17/14 0449 03/18/14 0500 03/19/14 0450 03/20/14 0347  NA 137 132* 139 138 136*  K 3.4* 3.5* 3.8 3.6* 4.4  CL 97 93* 98 96 94*  CO2 26 24 26 27 27   BUN 49* 69* 42* 29* 43*  CREATININE 3.92* 4.56* 3.10* 2.22* 2.92*  CALCIUM 8.3* 8.5 8.6 8.6 9.1  MG 2.0 1.9 2.0  --   --   PHOS 3.1 3.1 1.9*  --   --   GLUCOSE 197* 197* 141* 260* 169*    CBG (last 3)   Recent Labs  03/20/14 0342 03/20/14 0730 03/20/14 1134   GLUCAP 159* 158* 216*    Scheduled Meds: . antiseptic oral rinse  15 mL Mouth Rinse BID  . aspirin  81 mg Oral Daily  . atorvastatin  40 mg Per Tube q1800  . carvedilol  3.125 mg Oral BID WC  . ciprofloxacin  400 mg Intravenous Q24H  . feeding supplement (PRO-STAT SUGAR FREE 64)  30 mL Per Tube BID  . heparin subcutaneous  5,000 Units Subcutaneous 3 times per day  . insulin aspart  0-9 Units Subcutaneous 6 times per day  . levothyroxine  25 mcg Per Tube QAC breakfast  . mupirocin cream   Topical Daily  . pantoprazole sodium  40 mg Per Tube Daily    Continuous Infusions: . sodium chloride 10 mL/hr at 03/20/14 1100  . feeding supplement (NEPRO CARB STEADY) Stopped (03/19/14 0800)    Loyce Dys, MS RD LDN Clinical Inpatient Dietitian Pager: [phone] Weekend/After hours pager: [phone]

## 2014-03-21 ENCOUNTER — Telehealth: Payer: Self-pay | Admitting: *Deleted

## 2014-03-21 LAB — GLUCOSE, CAPILLARY
GLUCOSE-CAPILLARY: 140 mg/dL — AB (ref 70–99)
GLUCOSE-CAPILLARY: 141 mg/dL — AB (ref 70–99)
GLUCOSE-CAPILLARY: 210 mg/dL — AB (ref 70–99)
Glucose-Capillary: 114 mg/dL — ABNORMAL HIGH (ref 70–99)
Glucose-Capillary: 144 mg/dL — ABNORMAL HIGH (ref 70–99)
Glucose-Capillary: 170 mg/dL — ABNORMAL HIGH (ref 70–99)
Glucose-Capillary: 221 mg/dL — ABNORMAL HIGH (ref 70–99)

## 2014-03-21 LAB — CBC
HCT: 37.7 % (ref 36.0–46.0)
Hemoglobin: 11.8 g/dL — ABNORMAL LOW (ref 12.0–15.0)
MCH: 29.9 pg (ref 26.0–34.0)
MCHC: 31.3 g/dL (ref 30.0–36.0)
MCV: 95.7 fL (ref 78.0–100.0)
Platelets: 326 10*3/uL (ref 150–400)
RBC: 3.94 MIL/uL (ref 3.87–5.11)
RDW: 16.8 % — AB (ref 11.5–15.5)
WBC: 12.4 10*3/uL — AB (ref 4.0–10.5)

## 2014-03-21 LAB — BASIC METABOLIC PANEL
BUN: 25 mg/dL — ABNORMAL HIGH (ref 6–23)
CHLORIDE: 96 meq/L (ref 96–112)
CO2: 27 mEq/L (ref 19–32)
CREATININE: 2.24 mg/dL — AB (ref 0.50–1.10)
Calcium: 9.2 mg/dL (ref 8.4–10.5)
GFR calc non Af Amer: 21 mL/min — ABNORMAL LOW (ref >90)
GFR, EST AFRICAN AMERICAN: 24 mL/min — AB (ref >90)
Glucose, Bld: 141 mg/dL — ABNORMAL HIGH (ref 70–99)
Potassium: 4 mEq/L (ref 3.7–5.3)
Sodium: 137 mEq/L (ref 137–147)

## 2014-03-21 LAB — PHOSPHORUS: Phosphorus: 3.6 mg/dL (ref 2.3–4.6)

## 2014-03-21 LAB — MAGNESIUM: MAGNESIUM: 1.9 mg/dL (ref 1.5–2.5)

## 2014-03-21 MED ORDER — CIPROFLOXACIN HCL 500 MG PO TABS
500.0000 mg | ORAL_TABLET | ORAL | Status: AC
  Administered 2014-03-21 – 2014-03-22 (×2): 500 mg via ORAL
  Filled 2014-03-21 (×2): qty 1

## 2014-03-21 MED ORDER — PANTOPRAZOLE SODIUM 40 MG PO TBEC
40.0000 mg | DELAYED_RELEASE_TABLET | Freq: Every day | ORAL | Status: DC
  Administered 2014-03-21 – 2014-03-22 (×2): 40 mg via ORAL
  Filled 2014-03-21 (×2): qty 1

## 2014-03-21 NOTE — Evaluation (Signed)
Physical Therapy Evaluation Patient Details Name: [name] [name] MRN: [id number] DOB: 05/16/1941 Today's Date: 03/21/2014   History of Present Illness  admitted with PEA arrest hypotension/ transient bradycardia/brief CPR   Clinical Impression  Pt admitted with above, and with noted admission in late April with similar circumstance. Pt currently with functional limitations due to the deficits listed below (see PT Problem List).  Pt will benefit from skilled PT to increase their independence and safety with mobility to allow discharge to the venue listed below.       Follow Up Recommendations SNF;Supervision/Assistance - 24 hour SNF to maximize independence and safety with mobility prior to dc home     Equipment Recommendations  Rolling walker with 5" wheels;3in1 (PT);Wheelchair (measurements PT)    Recommendations for Other Services OT consult     Precautions / Restrictions Precautions Precautions: Fall      Mobility  Bed Mobility Overal bed mobility: Needs Assistance Bed Mobility: Supine to Sit     Supine to sit: Min guard Sit to supine: Min guard   General bed mobility comments: Good use of bedrail and noted less need for breath-holding valsalva-like effort to get up; assisted LEs back in the bed  Transfers Overall transfer level: Needs assistance Equipment used: 1 person hand held assist Transfers: Sit to/from [employer]Stand;Stand Pivot Transfers Sit to Stand: Min assist Stand pivot transfers: Min assist       General transfer comment: Pt did not want to use rW this session; min steadying assist to stand; Once standing, pt requested bsc, so min assis [facility], and then back to bed  Ambulation/Gait Ambulation/Gait assistance: Min assist Ambulation Distance (Feet): 3 Feet (pivot step s bed to bsc, then back to bed) Assistive device: 1 person hand held assist       General Gait Details: cues to self-monitor for activity tolerance  Stairs             Wheelchair Mobility    Modified Rankin (Stroke Patients Only)       Balance                                             Pertinent Vitals/Pain Session conducted on 3 L O2 with O3 sats ranging from 88-93%    Home Living Family/patient expects to be discharged to:: Private residence Living Arrangements: Children Available Help at Discharge: Family;Available PRN/intermittently Type of Home: House Home Access: Ramped entrance     Home Layout: One level Home Equipment: Bedside commode;Shower seat;Cane - single point Additional Comments: Has dialysis M,W,F    Prior Function Level of Independence: Independent         Comments: JRs transportation takes pt to dialysis MWF. Pt states that she cooks certain days of the week for her family, son helps her with laundry     Hand Dominance   Dominant Hand: Right    Extremity/Trunk Assessment   Upper Extremity Assessment: Overall WFL for tasks assessed           Lower Extremity Assessment: Generalized weakness         Communication   Communication: No difficulties  Cognition Arousal/Alertness: Awake/alert Behavior During Therapy: WFL for tasks assessed/performed Overall Cognitive Status: Within Functional Limits for tasks assessed                      General  Comments      Exercises        Assessment/Plan    PT Assessment Patient needs continued PT services  PT Diagnosis Difficulty walking;Generalized weakness   PT Problem List Decreased strength;Decreased range of motion;Decreased activity tolerance;Decreased balance;Decreased mobility;Decreased knowledge of use of DME;Decreased knowledge of precautions;Impaired sensation  PT Treatment Interventions DME instruction;Gait training;Functional mobility training;Therapeutic activities;Therapeutic exercise;Patient/family education;Balance training   PT Goals (Current goals can be found in the Care Plan section) Acute Rehab PT  Goals Patient Stated Goal: Would like to go home PT Goal Formulation: With patient Time For Goal Achievement: 04/04/14    Frequency Min 3X/week   Barriers to discharge Decreased caregiver support Will need to be mod I in order to dc home    Co-evaluation               End of Session Equipment Utilized During Treatment: Oxygen Activity Tolerance: Patient tolerated treatment well;Other (comment) (ended session in bedas pt was quite sleepy) Patient left: in bed;with call bell/phone within reach Nurse Communication: Mobility status         Time: 5183-4373 PT Time Calculation (min): 22 min   Charges:   PT Evaluation $Initial PT Evaluation Tier I: 1 Procedure PT Treatments $Therapeutic Activity: 8-22 mins   PT G Codes:          [facility] [place] 03/21/2014, 12:43 PM  Van Clines, PT  Acute Rehabilitation Services Pager [phone] Office [phone]

## 2014-03-21 NOTE — Progress Notes (Signed)
[facility] PROGRESS NOTE  [name] SnipeJudy A [name] WUJ:[id number] DOB: 08/26/1941 DOA: 03/13/2014 PCP: [name] [name]  Assessment/Plan: 73 y/o female with PMH of CHF, CAD, COPD, DM, h/o VTE, ESRD with HD via left ij tunneled diatek cath who had a PEA ARREST and was treated with EPI and CPR intubated, in ICU, extubatyed TF to   1. S/p cardiac arrest with hypotension/ transient bradycardia/brief CPR on 02/24/14 -h/o recurrent PEA arrest; no evidence acute MI or PE. Cardiology signed off  2. Acute respiratory failure due to PEA arrest; extubated  -COPD: cont bronchodilators, oxygen;  3. 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 ESRD on HD; cont per nephrology  6. UTI on cipro; cultures citrobacter on atx; afebrile;   7. DM last HA1c-7.5; cont ISS:  8. Debility, multiple medical illnesses -obtain PT consult, likely needs SNF   Per nephrology discussion; patient is family requested DNR     Code Status: DNR Family Communication: d/w patient (indicate person spoken with, relationship, and if by phone, the number) Disposition Plan: pend clinical improvement, likely SNF 24-48 hours    Consultants:  Nephrology critical care   SIGNIFICANT EVENTS / STUDIES:  5/11 pea arrest  5/11 CTA >>> no PE, b/l pleural effusions  LINES / TUBES:  5/11 ETT>>5/17  5/11 Left Fem CVL>>5/11  5/11 R  TLC>>>  5/11 L radial a-line>>>517  CULTURES:  Urine 5/11>>>CITROBACTER FREUNDII  ANTIBIOTICS:  Cipro 5/14>>>  HPI/Subjective: alert  Objective: Filed Vitals:   03/21/14 0851  BP: 146/79  Pulse: 96  Temp:   Resp:     Intake/Output Summary (Last 24 hours) at 03/21/14 1033 Last data filed at 03/20/14 1619  Gross per 24 hour  Intake      0 ml  Output   3001 ml  Net  -3001 ml   Filed  Weights   03/20/14 1244 03/20/14 1619 03/21/14 0400  Weight: 65 kg (143 lb 4.8 oz) 62.1 kg (136 lb 14.5 oz) 59.3 kg (130 lb 11.7 oz)    Exam:   General:  alert  Cardiovascular: s1,s2 rrr  Respiratory: LL crackles  Abdomen: soft, nt,nd   Musculoskeletal: mild edema   Data Reviewed: Basic Metabolic Panel:  Recent Labs Lab 03/15/14 0400 03/16/14 0430 03/17/14 0449 03/18/14 0500 03/19/14 0450 03/20/14 0347 03/21/14 0603  NA 137 137 132* 139 138 136* 137  K 3.4* 3.4* 3.5* 3.8 3.6* 4.4 4.0  CL 95* 97 93* 98 96 94* 96  CO2 26 26 24 26 27 27 27   GLUCOSE 243* 197* 197* 141* 260* 169* 141*  BUN 29* 49* 69* 42* 29* 43* 25*  CREATININE 2.66* 3.92* 4.56* 3.10* 2.22* 2.92* 2.24*  CALCIUM 8.4 8.3* 8.5 8.6 8.6 9.1 9.2  MG 1.7 2.0 1.9 2.0  --   --  1.9  PHOS 2.6 3.1 3.1 1.9*  --   --  3.6   Liver Function Tests: No results found for this basename: AST, ALT, ALKPHOS, BILITOT, PROT, ALBUMIN,  in the last 168 hours No results found for this basename: LIPASE, AMYLASE,  in the last 168 hours No results found for this basename: AMMONIA,  in the last 168 hours CBC:  Recent Labs Lab 03/17/14 0449 03/18/14 0500 03/19/14 0450 03/20/14 0347 03/21/14 0603  WBC 10.4 9.2 9.8 12.4*  12.4*  HGB 9.5* 9.6* 10.0* 10.8* 11.8*  HCT 30.3* 30.6* 31.8* 34.8* 37.7  MCV 93.2 93.9 94.4 95.9 95.7  PLT 216 210 261 302 326   Cardiac Enzymes: No results found for this basename: CKTOTAL, CKMB, CKMBINDEX, TROPONINI,  in the last 168 hours BNP (last 3 results)  Recent Labs  10/26/13 0510 11/07/13 0455 02/24/14 0900  PROBNP 9552.0* 22434.0* 32267.0*   CBG:  Recent Labs Lab 03/20/14 1134 03/20/14 1655 03/20/14 2134 03/21/14 0006 03/21/14 0406  GLUCAP 216* 82 145* 170* 140*    Recent Results (from the past 240 hour(s))  MRSA PCR SCREENING     Status: None   Collection Time    03/13/14 12:58 PM      Result Value Ref Range Status   MRSA by PCR NEGATIVE  NEGATIVE Final   Comment:             The GeneXpert MRSA Assay (FDA     approved for NASAL specimens     only), is one component of a     comprehensive MRSA colonization     surveillance program. It is not     intended to diagnose MRSA     infection nor to guide or     monitor treatment for     MRSA infections.  URINE CULTURE     Status: None   Collection Time    03/13/14 12:58 PM      Result Value Ref Range Status   Specimen Description URINE, CATHETERIZED   Final   Special Requests NONE   Final   Culture  Setup Time     Final   Value: 03/13/2014 14:12     Performed at [facility] Count     Final   Value: >=100,000 COLONIES/ML     Performed at [facility]   Culture     Final   Value: CITROBACTER FREUNDII     Performed at [facility]   Report Status 03/16/2014 FINAL   Final   Organism ID, Bacteria CITROBACTER FREUNDII   Final     Studies: Dg Chest Port 1 View  03/20/2014   CLINICAL DATA:  Shortness of breath.  Pulmonary edema.  EXAM: PORTABLE CHEST - 1 VIEW  COMPARISON:  DG CHEST 1V PORT dated 03/18/2014  FINDINGS: Interim removal of endotracheal and NG tube. Right IJ catheter, dual-lumen left IJ catheter in stable position. Cardiomegaly with pulmonary vascular prominence diffuse bile pulmonary edema with bilateral pleural effusions, particular on the left noted. No pneumothorax.  IMPRESSION: 1. Interim extubation removal of NG tube. Bilateral IJ lines in stable position. 2. Congestive heart failure with pulmonary edema and bilateral pleural effusions.   Electronically Signed   By: Maisie Fus  Register   On: 03/20/2014 07:40    Scheduled Meds: . antiseptic oral rinse  15 mL Mouth Rinse BID  . aspirin  81 mg Oral Daily  . atorvastatin  40 mg Per Tube q1800  . carvedilol  3.125 mg Oral BID WC  . ciprofloxacin  400 mg Intravenous Q24H  . feeding supplement (NEPRO CARB STEADY)  237 mL Oral BID BM  . heparin subcutaneous  5,000 Units Subcutaneous 3 times per day  . insulin aspart   0-9 Units Subcutaneous 6 times per day  . levothyroxine  25 mcg Per Tube QAC breakfast  . multivitamin  1 tablet Oral QHS  . mupirocin cream   Topical Daily  . pantoprazole sodium  40 mg Per  Tube Daily   Continuous Infusions: . 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 If 7PM-7AM, please contact night-coverage at [email or web address], password [facility] 03/21/2014, 10:33 AM  LOS: 8 days

## 2014-03-21 NOTE — Care Management Note (Signed)
    Page 1 of 1   03/21/2014     5:10:11 PM CARE MANAGEMENT NOTE 03/21/2014  Patient:  [name] [name], [name] [name]   Account Number:  [id number]  Date Initiated:  03/17/2014  Documentation initiated by:  Junius Creamer  Subjective/Objective Assessment:   adm w hypotension, vent     Action/Plan:   lives w son, pcp dr Genelle Bal   Anticipated DC Date:     Anticipated DC Plan:           Choice offered to / List presented to:             Status of service:   Medicare Important Message given?  YES (If response is "NO", the following Medicare IM given date fields will be blank) Date Medicare IM given:  03/17/2014 Date Additional Medicare IM given:    Discharge Disposition:    Per UR Regulation:  Reviewed for med. necessity/level of care/duration of stay  If discussed at Long Length of Stay Meetings, dates discussed:   03/21/2014  03/23/2014    Comments:  03-21-14 1705 Tomi Bamberger, RN,BSN [phone] 73 y/o female with Hx of CHF, CAD, COPD, DM, ESRD with HD via left ij tunneled diatek cath who had [name] PEA ARREST and was treated with EPI and CPR intubated- in ICU, extubated 03-19-14. Plan for d/c is SNF. CSW will assist for disposition needs.

## 2014-03-21 NOTE — Progress Notes (Addendum)
[facility] Progress Note   Subjective: Moved to floor bed, no complaints.  Chest is not as sore today. 3kg off yest w HD  Filed Vitals:   03/20/14 1619 03/20/14 1654 03/20/14 2134 03/21/14 0400  BP: 106/60 131/58 118/56 122/69  Pulse: 91 95 95 88  Temp: 97.3 F (36.3 C) 97.8 F (36.6 C) 98.8 F (37.1 C) 98.9 F (37.2 C)  TempSrc: Oral Oral Oral Oral  Resp: 19 16 18 18   Height:      Weight: 62.1 kg (136 lb 14.5 oz)   59.3 kg (130 lb 11.7 oz)  SpO2: 98% 96% 95% 100%   Exam: Alert, no distress, frail, elderly female No jvd Chest clear bilat RRR no MRG Abd soft, NTND, no ascites No LE edema L IJ cath Neuro is nf, ox3  HD: MWF [place] 4h  64kg   2/2.5 [place]   Heparin none   L IJ cath Hectorol 2   EPO none   Assessment: 1 Recurrent PEA arrest- no evidence acute MI or PE. Cardiology signed off 2 ESRD on HD 3 UTI on cipro 4 HTN/volume below dry wt, no vol excess 5 Known severe 3V CAD and EF 30% by cath Mar 2013 w failed PCI attempt 6 HPTH cont vit D, no binders w low phos 7 Anemia not on epo 8 DM per primary 9 Debility may benefit from PT consult, consider short-term SNF as well 10 EOL- discussed again with patient and she is requesting DNR status now.  She is fully oriented and aware of the decision. I have notified pt's daughter Fran LowesLorrie Duke and answered questions, she supports her mother's decision.   Plan- HD tomorrow, DNR order made.    Vinson Moselleob Harlei Lehrmann MD  pager [phone]    cell [phone]  03/21/2014, 6:33 AM     Recent Labs Lab 03/16/14 0430 03/17/14 0449 03/18/14 0500 03/19/14 0450 03/20/14 0347  NA 137 132* 139 138 136*  K 3.4* 3.5* 3.8 3.6* 4.4  CL 97 93* 98 96 94*  CO2 26 24 26 27 27   GLUCOSE 197* 197* 141* 260* 169*  BUN 49* 69* 42* 29* 43*  CREATININE 3.92* 4.56* 3.10* 2.22* 2.92*  CALCIUM 8.3* 8.5 8.6 8.6 9.1  PHOS 3.1 3.1 1.9*  --   --    No results found for this basename: AST, ALT, ALKPHOS, BILITOT, PROT, ALBUMIN,  in the  last 168 hours  Recent Labs Lab 03/18/14 0500 03/19/14 0450 03/20/14 0347  WBC 9.2 9.8 12.4*  HGB 9.6* 10.0* 10.8*  HCT 30.6* 31.8* 34.8*  MCV 93.9 94.4 95.9  PLT 210 261 302   . antiseptic oral rinse  15 mL Mouth Rinse BID  . aspirin  81 mg Oral Daily  . atorvastatin  40 mg Per Tube q1800  . carvedilol  3.125 mg Oral BID WC  . ciprofloxacin  400 mg Intravenous Q24H  . feeding supplement (NEPRO CARB STEADY)  237 mL Oral BID BM  . heparin subcutaneous  5,000 Units Subcutaneous 3 times per day  . insulin aspart  0-9 Units Subcutaneous 6 times per day  . levothyroxine  25 mcg Per Tube QAC breakfast  . multivitamin  1 tablet Oral QHS  . mupirocin cream   Topical Daily  . pantoprazole sodium  40 mg Per Tube Daily   . sodium chloride 10 mL/hr at 03/20/14 1100   sodium chloride, sodium chloride, acetaminophen (TYLENOL) oral liquid 160 mg/5 mL, feeding supplement (NEPRO CARB STEADY), heparin, lidocaine (PF),  lidocaine-prilocaine, midazolam, pentafluoroprop-tetrafluoroeth

## 2014-03-21 NOTE — Telephone Encounter (Signed)
x

## 2014-03-22 ENCOUNTER — Encounter (HOSPITAL_COMMUNITY): Payer: Self-pay | Admitting: General Practice

## 2014-03-22 DIAGNOSIS — D62 Acute posthemorrhagic anemia: Secondary | ICD-10-CM

## 2014-03-22 DIAGNOSIS — K31819 Angiodysplasia of stomach and duodenum without bleeding: Secondary | ICD-10-CM

## 2014-03-22 DIAGNOSIS — R9431 Abnormal electrocardiogram [ECG] [EKG]: Secondary | ICD-10-CM

## 2014-03-22 LAB — BASIC METABOLIC PANEL
BUN: 42 mg/dL — ABNORMAL HIGH (ref 6–23)
CALCIUM: 8.7 mg/dL (ref 8.4–10.5)
CO2: 25 meq/L (ref 19–32)
CREATININE: 2.98 mg/dL — AB (ref 0.50–1.10)
Chloride: 92 mEq/L — ABNORMAL LOW (ref 96–112)
GFR calc Af Amer: 17 mL/min — ABNORMAL LOW (ref >90)
GFR, EST NON AFRICAN AMERICAN: 15 mL/min — AB (ref >90)
Glucose, Bld: 144 mg/dL — ABNORMAL HIGH (ref 70–99)
Potassium: 3.4 mEq/L — ABNORMAL LOW (ref 3.7–5.3)
SODIUM: 133 meq/L — AB (ref 137–147)

## 2014-03-22 LAB — GLUCOSE, CAPILLARY
GLUCOSE-CAPILLARY: 115 mg/dL — AB (ref 70–99)
GLUCOSE-CAPILLARY: 134 mg/dL — AB (ref 70–99)
Glucose-Capillary: 121 mg/dL — ABNORMAL HIGH (ref 70–99)
Glucose-Capillary: 333 mg/dL — ABNORMAL HIGH (ref 70–99)
Glucose-Capillary: 216 mg/dL — ABNORMAL HIGH (ref 70–99)

## 2014-03-22 MED ORDER — ALTEPLASE 2 MG IJ SOLR: 2.0000 mg | Freq: Once | INTRAMUSCULAR | Status: DC | PRN

## 2014-03-22 MED ORDER — ONDANSETRON HCL 4 MG/2ML IJ SOLN: 4.0000 mg | Freq: Four times a day (QID) | INTRAMUSCULAR | Status: DC | PRN

## 2014-03-22 MED ORDER — CAMPHOR-MENTHOL 0.5-0.5 % EX LOTN: 1.0000 "application " | TOPICAL_LOTION | Freq: Three times a day (TID) | CUTANEOUS | Status: DC | PRN

## 2014-03-22 MED ORDER — LIDOCAINE HCL (PF) 1 % IJ SOLN: 5.0000 mL | INTRAMUSCULAR | Status: DC | PRN

## 2014-03-22 MED ORDER — SODIUM CHLORIDE 0.9 % IV SOLN: 100.0000 mL | INTRAVENOUS | Status: DC | PRN

## 2014-03-22 MED ORDER — CALCIUM CARBONATE 1250 MG/5ML PO SUSP
500.0000 mg | Freq: Four times a day (QID) | ORAL | Status: DC | PRN
  Filled 2014-03-22: qty 5

## 2014-03-22 MED ORDER — DOCUSATE SODIUM 283 MG RE ENEM
1.0000 | ENEMA | RECTAL | Status: DC | PRN
  Filled 2014-03-22: qty 1

## 2014-03-22 MED ORDER — ACETAMINOPHEN 325 MG PO TABS
ORAL_TABLET | ORAL | Status: AC
  Administered 2014-03-22: 650 mg via ORAL
  Filled 2014-03-22: qty 2

## 2014-03-22 MED ORDER — ZOLPIDEM TARTRATE 5 MG PO TABS: 5.0000 mg | ORAL_TABLET | Freq: Every evening | ORAL | Status: DC | PRN

## 2014-03-22 MED ORDER — ACETAMINOPHEN 650 MG RE SUPP: 650.0000 mg | Freq: Four times a day (QID) | RECTAL | Status: DC | PRN

## 2014-03-22 MED ORDER — HYDROXYZINE HCL 25 MG PO TABS: 25.0000 mg | ORAL_TABLET | Freq: Three times a day (TID) | ORAL | Status: DC | PRN

## 2014-03-22 MED ORDER — NEPRO/CARBSTEADY PO LIQD
237.0000 mL | Freq: Three times a day (TID) | ORAL | Status: DC | PRN
  Filled 2014-03-22: qty 237

## 2014-03-22 MED ORDER — SORBITOL 70 % SOLN
30.0000 mL | Status: DC | PRN
  Filled 2014-03-22: qty 30

## 2014-03-22 MED ORDER — HEPARIN SODIUM (PORCINE) 1000 UNIT/ML DIALYSIS: 1000.0000 [IU] | INTRAMUSCULAR | Status: DC | PRN

## 2014-03-22 MED ORDER — ACETAMINOPHEN 325 MG PO TABS
650.0000 mg | ORAL_TABLET | Freq: Four times a day (QID) | ORAL | Status: DC | PRN
  Administered 2014-03-22 – 2014-03-23 (×4): 650 mg via ORAL
  Filled 2014-03-22 (×3): qty 2

## 2014-03-22 MED ORDER — PENTAFLUOROPROP-TETRAFLUOROETH EX AERO: 1.0000 "application " | INHALATION_SPRAY | CUTANEOUS | Status: DC | PRN

## 2014-03-22 MED ORDER — NEPRO/CARBSTEADY PO LIQD: 237.0000 mL | ORAL | Status: DC | PRN

## 2014-03-22 MED ORDER — LIDOCAINE-PRILOCAINE 2.5-2.5 % EX CREA: 1.0000 "application " | TOPICAL_CREAM | CUTANEOUS | Status: DC | PRN

## 2014-03-22 MED ORDER — ONDANSETRON HCL 4 MG PO TABS: 4.0000 mg | ORAL_TABLET | Freq: Four times a day (QID) | ORAL | Status: DC | PRN

## 2014-03-22 NOTE — Discharge Summary (Signed)
Physician Discharge Summary  Patient ID: [name] [name] MRN: [id number] DOB/AGE: [birth year] 73 y.o.  Admit date: 03/13/2014 Discharge date: 03/23/2014  Primary Care Physician:  Quentin Mulling, MD  Discharge Diagnoses:    . Cardiac arrest with hypotension/ transient bradycardia- brief CPR- 02/24/14 . CAD- NSTEMI March 2015, severe 3V disease at cath March 2013 (failed RCA PCI then) . Acute respiratory failure improving secondary to pulmonary edema . Citrobacter urinary tract infection . HTN (hypertension) . DM (diabetes mellitus), type 2 with renal complications . Cardiomyopathy, ischemic- EF 30-35% . 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 Procaine Hcl Nausea And Vomiting and Other (See Comments)    Syncope/[facility] has reaction of "comatose"     Discharge Medications:   Medication List    STOP taking these medications       midodrine 5 MG tablet  Commonly known as:  PROAMATINE     multivitamin Tabs tablet      TAKE these medications       albuterol (5 MG/ML) 0.5% nebulizer solution  Commonly known as:  PROVENTIL  Take 0.5 mLs (2.5 mg total) by nebulization every 6 (six) hours as needed for wheezing or shortness of breath.     amLODipine 5 MG tablet  Commonly known as:  NORVASC  Take 1 tablet (5 mg total) by mouth daily.     aspirin EC 81 MG tablet  Take 81 mg by mouth daily.     budesonide 0.25 MG/2ML nebulizer solution  Commonly known as:  PULMICORT  Take 2 mLs (0.25 mg total) by nebulization 2 (two) times daily.     calcium acetate 667 MG capsule  Commonly known as:  PHOSLO  Take 667 mg by mouth 3 (three) times daily with meals.     carvedilol 3.125 MG  tablet  Commonly known as:  COREG  Take 3.125 mg by mouth 2 (two) times daily with a meal.     cyclobenzaprine 10 MG tablet  Commonly known as:  FLEXERIL  Take 1 tablet (10 mg total) by mouth 3 (three) times daily as needed for muscle spasms.     feeding supplement (NEPRO CARB STEADY) Liqd  Take 237 mLs by mouth 2 (two) times daily between meals.     insulin aspart 100 UNIT/ML injection  Commonly known as:  novoLOG  Inject 2 Units into the skin 3 (three) times daily with meals.     insulin glargine 100 UNIT/ML injection  Commonly known as:  LANTUS  Inject 0.1 mLs (10 Units total) into the skin at bedtime.     isosorbide mononitrate 30 MG 24 hr tablet  Commonly known as:  IMDUR  Take 15 mg by mouth daily.     levothyroxine 25 MCG tablet  Commonly known as:  SYNTHROID, LEVOTHROID  Take 25 mcg by mouth daily.     mupirocin cream 2 %  Commonly known as:  BACTROBAN  Apply topically daily. To left heel     nitroGLYCERIN 0.4 MG SL tablet  Commonly known as:  NITROSTAT  Place  0.4 mg under the tongue every 5 (five) minutes as needed for chest pain. For chest pain.     pantoprazole 40 MG tablet  Commonly known as:  PROTONIX  Take 40 mg by mouth daily.     PROBIOTIC PO  Take 1 capsule by mouth daily as needed (digestion).     simvastatin 80 MG tablet  Commonly known as:  ZOCOR  Take 1 tablet (80 mg total) by mouth every evening.     tiotropium 18 MCG inhalation capsule  Commonly known as:  SPIRIVA  Place 1 capsule (18 mcg total) into inhaler and inhale daily.         Brief H and P: For complete details please refer to admission H and P, but in brief Patient is a 73 year old woman with a PMH of HTN, DM, PAD, CAD/MI with recent NSTEMI in March 2015, CHF with EF 30-35%, ESRD/HD MWF, COPD on home O2 2L, hx DVT and PE not on anticoagulant due to GIB, and Hypothyroidism.  Patient stated that she had been fatigued for approximately 3 days, without clear precipitant at onset. She  was noted to be unresponsive at Dialysis center and ? 2-10 mins CPR was performed (per EMS/ED note). Upon arrival to the ED, she was initially hemodynamically stable. Soon thereafter she became unresponsive, hypotensive and bradycardia. Her mental status quickly improved but restless after BVM and IVF.   Hospital Course:   73 y/o female with PMH of CHF, CAD, COPD, DM, h/o VTE, ESRD with HD via left ij tunneled diatek cath who had a PEA ARREST and was treated with EPI and CPR intubated. [facility], [facility] and transferred to [place]. She was admitted by critical care service and  transferred to hospitalist service on 03/21/2014   1. S/p cardiac arrest with hypotension/ transient bradycardia/brief CPR on 02/24/14. Cardiology and EP cardiology were consulted. Per cardiology, possibly due to bradycardia although per EP possibly due to hypotensive events related to dialysis. Coreg was discontinued. She was not considered a candidate for invasive EP procedures at this time. Per Dr. Johney FrameAllred unless she has clear documentation of symptomatic bradycardia, then we should avoid pacemaker in the future. No driving for 6 months. Patient has appointment with cardiology for followup. CT angiogram of the chest was negative for PE. 2Decho on 03/15/14 showed EF of 50%, grade 1 diastolic dysfunction, probably RV dysfunction.   2. Acute respiratory failure due to PEA arrest; extubated On 03/19/2014, improving, continue bronchodilators and O2 via nasal cannula.    3. 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 ESRD on HD; cont on Monday Wednesday and Friday.  6. UTI :  urine culture showed Citrobacter, patient has finished a course of ciprofloxacin   7. DM last HA1c-7.5;  continue sliding scale insulin  8. Debility, multiple medical illnesses  -  PTOT evaluation was done and recommended skilled nursing facility   Day of Discharge BP 155/70  Pulse 85  Temp(Src) 98.7 F (37.1 C) (Oral)  Resp 20  Ht 5' 4.96" (1.65 m)  Wt 61.2 kg (134 lb 14.7 oz)  BMI 22.48 kg/m2  SpO2 92%  Physical Exam: General: Alert and awake oriented x3 not in any acute distress. CVS: S1-S2 clear no  murmur rubs or gallops Chest: Decreased breath sound at the bases Abdomen: soft nontender, nondistended, normal bowel sounds Extremities: no cyanosis, clubbing or edema noted bilaterally Neuro: Cranial nerves II-XII intact, no focal neurological deficits   The results of significant diagnostics from this hospitalization (including imaging, microbiology, ancillary and laboratory) are listed below for reference.    LAB RESULTS: Basic Metabolic Panel:  Recent Labs Lab 03/21/14 0603 03/22/14 0738  NA 137 133*  K 4.0 3.4*  CL 96 92*  CO2 27 25  GLUCOSE 141* 144*  BUN 25* 42*  CREATININE 2.24* 2.98*  CALCIUM 9.2 8.7  MG 1.9  --   PHOS 3.6  --    Liver Function Tests: No results found for this basename: AST, ALT, ALKPHOS, BILITOT, PROT, ALBUMIN,  in the last 168 hours No results found for this basename: LIPASE, AMYLASE,  in the last 168 hours No results found for this basename: AMMONIA,  in the last 168 hours CBC:  Recent Labs Lab 03/20/14 0347 03/21/14 0603  WBC 12.4* 12.4*  HGB 10.8* 11.8*  HCT 34.8* 37.7  MCV 95.9 95.7  PLT 302 326   Cardiac Enzymes: No results found for this basename: CKTOTAL, CKMB, CKMBINDEX, TROPONINI,  in the last 168 hours BNP: No components found with this basename: POCBNP,  CBG:  Recent Labs Lab 03/23/14 0734 03/23/14 1133  GLUCAP 113* 245*    Significant Diagnostic Studies:  Ct Angio Chest Pe W/cm &/or Wo Cm  03/13/2014   CLINICAL DATA:  Decreased oxygen saturation  EXAM: CT ANGIOGRAPHY CHEST WITH CONTRAST  TECHNIQUE:  Multidetector CT imaging of the chest was performed using the standard protocol during bolus administration of intravenous contrast. Multiplanar CT image reconstructions and MIPs were obtained to evaluate the vascular anatomy.  CONTRAST:  77mL OMNIPAQUE IOHEXOL 350 MG/ML SOLN  COMPARISON:  11/08/2013  FINDINGS: An endotracheal tube, nasogastric catheter and left-sided dialysis catheter are again noted. The remains are narrowing of the right innominate vein appear in these changes are stable from the prior exam. A right-sided central venous line is noted as well.  Large bilateral pleural effusions are noted with lower lobe consolidation noted. These changes of increased in the interval from the prior exam. No focal pulmonary nodule is identified. Minimal patchy the upper lobe atelectasis is noted on right.  The thoracic aorta is well visualized and heavily calcified. No aneurysmal dilatation or dissection is noted. The origins of the great vessels are patent however some stenosis is noted in the left subclavian artery beyond its origin secondary to both calcified and noncalcified plaque. Pulmonary artery is well visualized and demonstrates a normal branching pattern. No filling defects to suggest pulmonary emboli are identified. No significant hilar or mediastinal adenopathy is noted. Scattered small mediastinal lymph nodes are seen. In retrospect these are stable from the prior exam.  Scanning into the upper abdomen reveals very mild ascites. The osseous structures show no acute abnormality.  Review of the MIP images confirms the above findings.  IMPRESSION: No evidence of pulmonary emboli.  Bilateral pleural effusions with lower lobe consolidation. This is increased in the interval from the prior exam.  Tubes and lines as described.  Chronic changes as described.   Electronically Signed   By: Alcide Clever M.D.   On: 03/13/2014 18:42   Dg Chest Port 1 View  03/14/2014   CLINICAL DATA:  Hypoxia  EXAM: PORTABLE  CHEST - 1 VIEW  COMPARISON:  Chest radiograph and chest CT Mar 13, 2014  FINDINGS: Endotracheal tube tip is 6.1 cm above the carina. The dual-lumen catheter tip is at the cavoatrial junction. The right central catheter tip is in the distal superior vena cava. No pneumothorax.  There are bilateral effusions with bibasilar consolidation. There is underlying interstitial edema. Heart is upper normal in size with pulmonary venous hypertension.  IMPRESSION: Tube and catheter positions as described without appreciable pneumothorax. Underlying congestive heart failure. Superimposed pneumonia in the bases cannot be excluded on this study.   Electronically Signed   By: Bretta Bang M.D.   On: 03/14/2014 07:36   Dg Chest Port 1 View  03/13/2014   CLINICAL DATA:  Central line placement.  EXAM: PORTABLE CHEST - 1 VIEW  COMPARISON:  03/13/2014  FINDINGS: Right subclavian central line has been placed with the tip in the SVC. Left dialysis catheter, endotracheal tube and NG tube are unchanged. No pneumothorax. Bilateral lower lobe airspace opacities and effusions are again noted, stable. Heart is normal size.  IMPRESSION: New right subclavian central line in place with the tip in the SVC. No pneumothorax. Otherwise no significant change.   Electronically Signed   By: Charlett Nose M.D.   On: 03/13/2014 16:33   Dg Chest Port 1 View  03/13/2014   CLINICAL DATA:  Hypoxia  EXAM: PORTABLE CHEST - 1 VIEW  COMPARISON:  Study obtained earlier in the day  FINDINGS: Endotracheal tube tip is 3.9 cm above the carina. Central catheter tip is essentially at the cavoatrial junction. There is no a nasogastric tube with the tip and side-port in the stomach. No pneumothorax. There is consolidation in the left lower lobe with left effusion. There is more patchy infiltrate in the right lower lobe. There is moderate interstitial edema. Heart is mildly enlarged with pulmonary venous hypertension. No adenopathy. There is atherosclerotic change  in the aorta.  IMPRESSION: Congestive heart failure. Question superimposed pneumonia left lower lobe. Tube and catheter positions as described without apparent pneumothorax.   Electronically Signed   By: Bretta Bang M.D.   On: 03/13/2014 14:14    2D ECHO: Study Conclusions  - Left ventricle: Technically very limited study. Hypokinesis at the base of the inferior wall. The estimated ejection fraction was 50%. Doppler parameters are consistent with abnormal left ventricular relaxation (grade 1 diastolic dysfunction). - Right ventricle: There probably is RV dysfunction. Not able to assess RV function due to poor visualization. Not able to assess RV size due to poor visualization.    Disposition and Follow-up: Discharge Instructions   Diet Carb Modified    Complete by:  As directed      Increase activity slowly    Complete by:  As directed             DISPOSITION: Skilled nursing facility  DIET:Renal diet   DISCHARGE FOLLOW-UP Follow-up Information   Follow up with Tereso Newcomer, PA-C On 04/18/2014. (at 11:30AM , for hospital follow-up)    Specialty:  Physician Assistant   Contact information:   [address] [place] [address] [phone]       Follow up with HOOPER,[name], MD. Schedule an appointment as soon as possible for a visit in 2 weeks. (for hospital follow-up)    Specialty:  Geriatric Medicine   Contact information:   [address] [place] [address] [phone]       Time spent on Discharge: 45 minutes   Signed:   Xaiver Roskelley Jenna Luo M.D. [facility] 03/23/2014, 12:54 PM Pager: [phone]   **  Disclaimer: This note was dictated with voice recognition software. Similar sounding words can inadvertently be transcribed and this note may contain transcription errors which may not have been corrected upon publication of note.**

## 2014-03-22 NOTE — Progress Notes (Signed)
Clinical Social Work Department BRIEF PSYCHOSOCIAL ASSESSMENT 03/22/2014  Patient:  [name] [name], [name] [name]     Account Number:  [id number]     Admit date:  03/13/2014  Clinical Social Worker:  Harless Nakayama  Date/Time:  03/22/2014 01:30 PM  Referred by:  Physician  Date Referred:  03/22/2014 Referred for  SNF Placement   Other Referral:   Interview type:  Patient Other interview type:    PSYCHOSOCIAL DATA Living Status:  WITH ADULT CHILDREN Admitted from facility:   Level of care:   Primary support name:  Dwayne Gradillas Primary support relationship to patient:  CHILD, ADULT Degree of support available:   Pt has good support from family    CURRENT CONCERNS Current Concerns  Post-Acute Placement   Other Concerns:    SOCIAL WORK ASSESSMENT / PLAN CSW visited pt room and spoke with her about PT recommendation. Pt was not aware of recommendation for SNF but informed CSW she has been to [name] before. Pt confirmed she was prviously at [name] and would be agreeable to return. CSW explained referral process and pt was agreeable to faxing out to other facilities in [facility] as well as [name] back up. Pt had no concerns at this time and is in agreement that SNF for ST rehab will be best before returning home with her son.   Assessment/plan status:  Psychosocial Support/Ongoing Assessment of Needs Other assessment/ plan:   Information/referral to community resources:   SNF list to be provided with bed offers if needed    PATIENT'S/FAMILY'S RESPONSE TO PLAN OF CARE: Pt is agreeable to SNF        Natanael Saladin, LCSWA [phone]

## 2014-03-22 NOTE — Progress Notes (Signed)
Patient ID: [name] [name]  female  ZOX:[id number]    DOB: 1941/08/07    DOA: 03/13/2014  PCP: Quentin Mulling, MD  Assessment/Plan:   73 y/o female with PMH of CHF, CAD, COPD, DM, h/o VTE, ESRD with HD via left ij tunneled diatek cath who had a PEA ARREST and was treated with EPI and CPR intubated. Patient was taken to [facility] ED, intubated and transferred to [place]. She was admitted by critical care service and it transferred to try to hospitalist service on 03/21/2014  1. S/p cardiac arrest with hypotension/ transient bradycardia/brief CPR on 02/24/14  -h/o recurrent PEA arrest; no evidence acute MI or PE. Cardiology signed off   2. Acute respiratory failure due to PEA arrest; extubated  -COPD: cont bronchodilators, oxygen  3. 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 ESRD on HD; cont per nephrology  - patient seen   6. UTI on cipro; cultures citrobacter on atx; afebrile;   7. DM last HA1c-7.5; cont ISS:   8. Debility, multiple medical illnesses  - needs SNF  DVT Prophylaxis:  Code Status:  Family Communication:  Disposition: DC to skilled nursing facility when bed available, hopefully tomorrow  Consultants:  Renal  cardiology  Procedures:  HD  Antibiotics:      Subjective: Patient seen during hemodialysis today, denies any significant complaints except weakness and sore at left IJ catheter site  Objective: Weight change: -6.002 kg (-13 lb 3.7 oz)  Intake/Output Summary (Last 24 hours) at 03/22/14 1252 Last data filed at 03/22/14 1055  Gross per 24 hour  Intake      0 ml  Output   1600 ml  Net  -1600 ml   Blood pressure 153/69, pulse 84, temperature 97.9 F (36.6 C), temperature source Oral, resp. rate 16, height 5' 4.96" (1.65  m), weight 61 kg (134 lb 7.7 oz), SpO2 97.00%.  Physical Exam: General: Alert and awake, oriented x3, not in any acute distress. CVS: S1-S2 clear, no murmur rubs or gallops Chest: clear to auscultation bilaterally, no wheezing, rales or rhonchi Abdomen: soft nontender, nondistended, normal bowel sounds  Extremities: no cyanosis, clubbing or edema noted bilaterally Neuro: Cranial nerves II-XII intact, no focal neurological deficits  Lab Results: Basic Metabolic Panel:  Recent Labs Lab 03/21/14 0603 03/22/14 0738  NA 137 133*  K 4.0 3.4*  CL 96 92*  CO2 27 25  GLUCOSE 141* 144*  BUN 25* 42*  CREATININE 2.24* 2.98*  CALCIUM 9.2 8.7  MG 1.9  --   PHOS 3.6  --    Liver Function Tests: No results found for this basename: AST, ALT, ALKPHOS, BILITOT, PROT, ALBUMIN,  in the last 168 hours No results found for this basename: LIPASE, AMYLASE,  in the last 168 hours No results found for this basename: AMMONIA,  in the last 168 hours CBC:  Recent Labs Lab 03/20/14 0347 03/21/14 0603  WBC 12.4* 12.4*  HGB 10.8* 11.8*  HCT 34.8* 37.7  MCV 95.9 95.7  PLT 302 326   Cardiac Enzymes: No results found for this basename: CKTOTAL, CKMB, CKMBINDEX, TROPONINI,  in the last 168 hours BNP: No components found with this basename: POCBNP,  CBG:  Recent Labs Lab 03/21/14 1635 03/21/14 2006 03/21/14 2358 03/22/14 0421  03/22/14 1216  GLUCAP 141* 210* 114* 115* 121*     Micro Results: Recent Results (from the past 240 hour(s))  MRSA PCR SCREENING     Status: None   Collection Time    03/13/14 12:58 PM      Result Value Ref Range Status   MRSA by PCR NEGATIVE  NEGATIVE Final   Comment:            The GeneXpert MRSA Assay (FDA     approved for NASAL specimens     only), is one component of a     comprehensive MRSA colonization     surveillance program. It is not     intended to diagnose MRSA     infection nor to guide or     monitor treatment for     MRSA infections.    URINE CULTURE     Status: None   Collection Time    03/13/14 12:58 PM      Result Value Ref Range Status   Specimen Description URINE, CATHETERIZED   Final   Special Requests NONE   Final   Culture  Setup Time     Final   Value: 03/13/2014 14:12     Performed at [facility] Count     Final   Value: >=100,000 COLONIES/ML     Performed at [facility]   Culture     Final   Value: CITROBACTER FREUNDII     Performed at [facility]   Report Status 03/16/2014 FINAL   Final   Organism ID, Bacteria CITROBACTER FREUNDII   Final    Studies/Results: Dg Chest 2 View  03/02/2014   CLINICAL DATA:  Weakness, shortness of breath  EXAM: CHEST  2 VIEW  COMPARISON:  Prior radiograph from 02/28/2014  FINDINGS: Left-sided hemodialysis catheter again noted. Cardiomegaly is stable. Mediastinal silhouette within normal limits.  Veiling opacity overlying the left hemidiaphragm is compatible with moderate left pleural effusion. A smaller right pleural effusion is likely present as well. There is mild central perihilar vascular congestion without overt pulmonary edema. No focal infiltrate identified. There is no pneumothorax.  Osseous structures are unchanged.  IMPRESSION: 1. Moderate bilateral pleural effusions, left greater than right. 2. Stable cardiomegaly without overt pulmonary edema.   Electronically Signed   By: Rise Mu M.D.   On: 03/02/2014 00:20   Dg Chest 2 View  02/28/2014   CLINICAL DATA:  Over clip chest pain, shortness of breath, history hypertension, CHF, diabetes, COPD, former smoker, end-stage renal disease on dialysis  EXAM: CHEST  2 VIEW  COMPARISON:  02/25/2014  FINDINGS: RIGHT jugular dual-lumen central venous catheter with distal tip projecting over the RIGHT atrium.  Enlargement of cardiac silhouette with pulmonary vascular congestion.  Atherosclerotic calcifications aorta.  Bibasilar effusions and atelectasis.  Peribronchial thickening with  improved perihilar edema since previous exam.  No pneumothorax.  Bones demineralized.  IMPRESSION: Enlargement of cardiac silhouette with pulmonary vascular congestion.  Improved pulmonary edema with persistent bibasilar effusions and atelectasis.   Electronically Signed   By: Ulyses Southward M.D.   On: 02/28/2014 15:29   Ct Angio Chest Pe W/cm &/or Wo Cm  03/13/2014   CLINICAL DATA:  Decreased oxygen saturation  EXAM: CT ANGIOGRAPHY CHEST WITH CONTRAST  TECHNIQUE: Multidetector CT imaging of the chest was performed using the standard protocol during bolus administration of intravenous contrast. Multiplanar CT image reconstructions and MIPs were obtained to evaluate the vascular anatomy.  CONTRAST:  77mL OMNIPAQUE IOHEXOL 350 MG/ML SOLN  COMPARISON:  11/08/2013  FINDINGS: An endotracheal tube, nasogastric catheter and left-sided dialysis catheter are again noted. The remains are narrowing of the right innominate vein appear in these changes are stable from the prior exam. A right-sided central venous line is noted as well.  Large bilateral pleural effusions are noted with lower lobe consolidation noted. These changes of increased in the interval from the prior exam. No focal pulmonary nodule is identified. Minimal patchy the upper lobe atelectasis is noted on right.  The thoracic aorta is well visualized and heavily calcified. No aneurysmal dilatation or dissection is noted. The origins of the great vessels are patent however some stenosis is noted in the left subclavian artery beyond its origin secondary to both calcified and noncalcified plaque. Pulmonary artery is well visualized and demonstrates a normal branching pattern. No filling defects to suggest pulmonary emboli are identified. No significant hilar or mediastinal adenopathy is noted. Scattered small mediastinal lymph nodes are seen. In retrospect these are stable from the prior exam.  Scanning into the upper abdomen reveals very mild ascites. The osseous  structures show no acute abnormality.  Review of the MIP images confirms the above findings.  IMPRESSION: No evidence of pulmonary emboli.  Bilateral pleural effusions with lower lobe consolidation. This is increased in the interval from the prior exam.  Tubes and lines as described.  Chronic changes as described.   Electronically Signed   By: Alcide Clever M.D.   On: 03/13/2014 18:42   Dg Chest Port 1 View  03/20/2014   CLINICAL DATA:  Shortness of breath.  Pulmonary edema.  EXAM: PORTABLE CHEST - 1 VIEW  COMPARISON:  DG CHEST 1V PORT dated 03/18/2014  FINDINGS: Interim removal of endotracheal and NG tube. Right IJ catheter, dual-lumen left IJ catheter in stable position. Cardiomegaly with pulmonary vascular prominence diffuse bile pulmonary edema with bilateral pleural effusions, particular on the left noted. No pneumothorax.  IMPRESSION: 1. Interim extubation removal of NG tube. Bilateral IJ lines in stable position. 2. Congestive heart failure with pulmonary edema and bilateral pleural effusions.   Electronically Signed   By: Maisie Fus  Register   On: 03/20/2014 07:40   Dg Chest Port 1 View  03/18/2014   CLINICAL DATA:  Endotracheal tube  EXAM: PORTABLE CHEST - 1 VIEW  COMPARISON:  03/17/2014  FINDINGS: Endotracheal tube ends in the mid thoracic trachea. An orogastric tube crosses the diaphragm. Right subclavian central line and left IJ split tip catheter are in stable position, located at the region of the upper cavoatrial junction.  Normal heart size and stable upper mediastinal contours. Significantly improved pulmonary edema. Haziness of the lower chest consistent with persistent pleural effusions and probable associated atelectasis. No pneumothorax.  IMPRESSION: 1. Well-positioned tubes and lines. 2. Improved pulmonary edema. 3. Small bilateral pleural effusion.   Electronically Signed   By: Tiburcio Pea M.D.   On: 03/18/2014 07:51   Dg Chest Port 1 View  03/17/2014   CLINICAL DATA:  Evaluate  endotracheal tube  EXAM: PORTABLE CHEST - 1 VIEW  COMPARISON:  DG CHEST 1V PORT dated 03/16/2014; DG CHEST 1V PORT dated 03/14/2014; DG CHEST 1V PORT dated 03/13/2014; CT ANGIO CHEST W/CM &/OR WO/CM dated 03/13/2014  FINDINGS: Grossly unchanged cardiac silhouette and mediastinal contours with atherosclerotic plaque within the thoracic aorta. Stable position of support apparatus. No pneumothorax. Pulmonary vasculature appears less distinct in the present examination with cephalization of flow. Suspected worsening of at least  small layering bilateral effusions with associated worsening bilateral mid and lower lung heterogeneous opacities unchanged bones.  IMPRESSION: 1.  Stable positioning of support apparatus.  No pneumothorax. 2. Findings suggestive of worsening pulmonary edema with interval increase in (at least) small sized layering bilateral effusions with associated worsening bilateral mid and lower lung atelectasis.   Electronically Signed   By: Simonne Come M.D.   On: 03/17/2014 07:24   Dg Chest Port 1 View  03/16/2014   CLINICAL DATA:  Assess endotracheal tube  EXAM: PORTABLE CHEST - 1 VIEW  COMPARISON:  DG CHEST 1V PORT dated 03/15/2014; DG CHEST 1V PORT dated 03/14/2014; CT ANGIO CHEST W/CM &/OR WO/CM dated 03/13/2014  FINDINGS: No change endotracheal tube again about 4.7 cm above the carinal. Although lines and tubes are also unchanged.  Cardiac enlargement with vascular congestion. Mild diffuse interstitial prominence with more focal consolidation right lower lobe. Hazy bibasilar densities suggesting pleural effusions, right greater than left.  IMPRESSION: Congestive heart failure with pulmonary edema, with improved aeration at the left base and no significant change on the right.   Electronically Signed   By: Esperanza Heir M.D.   On: 03/16/2014 07:21   Dg Chest Port 1 View  03/15/2014   CLINICAL DATA:  Shortness of breath.  EXAM: PORTABLE CHEST - 1 VIEW  COMPARISON:  03/14/2014  FINDINGS: Endotracheal  tube is 4.8 cm above the carina. Dialysis catheter tip at the cavoatrial junction and stable. Right subclavian central line in the lower SVC. Again noted are basilar densities. Prominent interstitial markings suggest interstitial edema. The right cardiac silhouette is now obscured. Negative for a pneumothorax. Nasogastric tube extends into the abdomen.  IMPRESSION: Increased basilar densities, particularly on the right side. Findings suggest a combination of edema, pleural effusions and consolidation/atelectasis.  Support apparatuses are appropriately positioned.   Electronically Signed   By: Richarda Overlie M.D.   On: 03/15/2014 07:21   Dg Chest Port 1 View  03/14/2014   CLINICAL DATA:  Hypoxia  EXAM: PORTABLE CHEST - 1 VIEW  COMPARISON:  Chest radiograph and chest CT Mar 13, 2014  FINDINGS: Endotracheal tube tip is 6.1 cm above the carina. The dual-lumen catheter tip is at the cavoatrial junction. The right central catheter tip is in the distal superior vena cava. No pneumothorax.  There are bilateral effusions with bibasilar consolidation. There is underlying interstitial edema. Heart is upper normal in size with pulmonary venous hypertension.  IMPRESSION: Tube and catheter positions as described without appreciable pneumothorax. Underlying congestive heart failure. Superimposed pneumonia in the bases cannot be excluded on this study.   Electronically Signed   By: Bretta Bang M.D.   On: 03/14/2014 07:36   Dg Chest Port 1 View  03/13/2014   CLINICAL DATA:  Central line placement.  EXAM: PORTABLE CHEST - 1 VIEW  COMPARISON:  03/13/2014  FINDINGS: Right subclavian central line has been placed with the tip in the SVC. Left dialysis catheter, endotracheal tube and NG tube are unchanged. No pneumothorax. Bilateral lower lobe airspace opacities and effusions are again noted, stable. Heart is normal size.  IMPRESSION: New right subclavian central line in place with the tip in the SVC. No pneumothorax. Otherwise no  significant change.   Electronically Signed   By: Charlett Nose M.D.   On: 03/13/2014 16:33   Dg Chest Port 1 View  03/13/2014   CLINICAL DATA:  Hypoxia  EXAM: PORTABLE CHEST - 1 VIEW  COMPARISON:  Study obtained earlier in the day  FINDINGS: Endotracheal tube tip is 3.9 cm above the carina. Central catheter tip is essentially at the cavoatrial junction. There is no a nasogastric tube with the tip and side-port in the stomach. No pneumothorax. There is consolidation in the left lower lobe with left effusion. There is more patchy infiltrate in the right lower lobe. There is moderate interstitial edema. Heart is mildly enlarged with pulmonary venous hypertension. No adenopathy. There is atherosclerotic change in the aorta.  IMPRESSION: Congestive heart failure. Question superimposed pneumonia left lower lobe. Tube and catheter positions as described without apparent pneumothorax.   Electronically Signed   By: Bretta BangWilliam  Woodruff M.D.   On: 03/13/2014 14:14   Dg Chest Port 1 View  03/11/2014   CLINICAL DATA:  Shortness of breath  EXAM: PORTABLE CHEST - 1 VIEW  COMPARISON:  03/01/2014  FINDINGS: Left IJ dialysis catheter, tip at the level of the upper right atrium.  Stable mild cardiomegaly. Widening of the upper mediastinum related to apical lordotic positioning. Small to moderate bilateral pleural effusions. These are likely stable from prior given differences in patient positioning, with inferior flow of fluid on the right. Venous congestion with diffuse interstitial coarsening and cephalized blood flow, increased from previous.  IMPRESSION: 1. Small to moderate bilateral pleural effusions. 2. Pulmonary venous hypertension.   Electronically Signed   By: Tiburcio PeaJonathan  Watts M.D.   On: 03/11/2014 22:35   Dg Chest Port 1 View  02/25/2014   CLINICAL DATA:  Respiratory failure  EXAM: PORTABLE CHEST - 1 VIEW  COMPARISON:  Prior chest x-ray 02/24/2014  FINDINGS: Stable cardiomegaly. Left IJ approach split tip hemodialysis  catheter with catheter tips at the superior cavoatrial junction and upper right atrium. Atherosclerotic calcifications in the transverse aorta. Increased pulmonary edema. Enlarging bilateral layering pleural effusions and associated bibasilar atelectasis. No acute osseous abnormality. No pneumothorax.  IMPRESSION: Developing pulmonary edema and enlarging bilateral layering effusions. Differential considerations include CHF and volume overload.   Electronically Signed   By: Malachy MoanHeath  McCullough M.D.   On: 02/25/2014 09:16   Dg Chest Port 1 View  02/24/2014   CLINICAL DATA:  post-arrest  EXAM: PORTABLE CHEST - 1 VIEW  COMPARISON:  DG CHEST 1V PORT dated 01/04/2014  FINDINGS: The cardiac silhouette is enlarged. There is diffuse prominence of interstitial markings, peribronchial cuffing, indistinctness of pulmonary vasculature. Areas of increased density in the lung bases. Small bilateral effusions left greater than right. No acute osseous abnormalities.  IMPRESSION: Pulmonary edema and small bilateral effusions. Areas of increased density within the lung bases consistent with areas of asymmetric edema versus atelectasis.   Electronically Signed   By: Salome HolmesHector  Cooper M.D.   On: 02/24/2014 08:12   Dg Abd Portable 1v  03/19/2014   CLINICAL DATA:  Orogastric tube placement.  EXAM: PORTABLE ABDOMEN - 1 VIEW  COMPARISON:  03/13/2014.  FINDINGS: Normal bowel gas pattern. Orogastric tube tip in the mid stomach. Small bilateral pleural effusions and left basilar atelectasis. Mild scoliosis. Atheromatous arterial calcifications.  IMPRESSION: 1. Orogastric tube tip in the mid stomach. 2. Small bilateral pleural effusions. 3. Left lower lobe atelectasis.   Electronically Signed   By: Gordan PaymentSteve  Reid M.D.   On: 03/19/2014 03:49   Dg Abd Portable 1v  02/24/2014   CLINICAL DATA:  confirm R fem line  EXAM: PORTABLE ABDOMEN - 1 VIEW  COMPARISON:  DG LUMBAR SPINE COMPLETE 4+V dated 01/16/2014; CT ABD/PELV WO CM dated 03/09/2013  FINDINGS:  The bowel gas pattern is normal. Coarse vascular calcifications  appreciated. Tip of a right femoral central venous catheter is appreciated projecting in the region of the proximal femoral vein on the right. No acute osseous abnormalities.  IMPRESSION: Nonobstructive bowel gas pattern. Right femoral vein central venous catheter appreciated.   Electronically Signed   By: Salome Holmes M.D.   On: 02/24/2014 08:11   Dg Foot Complete Left  02/24/2014   CLINICAL DATA:  Left heel wound.  EXAM: LEFT FOOT - COMPLETE 3+ VIEW  COMPARISON:  Os calcis 04/19/2013.  FINDINGS: Skin wound is seen along the lateral margin of the calcaneus. No bony destructive change is seen although the calcaneus is not well visualized on this exam. Pes cavus deformity is noted.  IMPRESSION: Soft tissue wound lateral calcaneus. No plain film evidence of osteomyelitis although the calcaneus is poorly visualized.  Pes cavus.   Electronically Signed   By: Drusilla Kanner M.D.   On: 02/24/2014 20:42    Medications: Scheduled Meds: . antiseptic oral rinse  15 mL Mouth Rinse BID  . aspirin  81 mg Oral Daily  . atorvastatin  40 mg Per Tube q1800  . carvedilol  3.125 mg Oral BID WC  . ciprofloxacin  500 mg Oral Q24 Hr x 2  . feeding supplement (NEPRO CARB STEADY)  237 mL Oral BID BM  . heparin subcutaneous  5,000 Units Subcutaneous 3 times per day  . insulin aspart  0-9 Units Subcutaneous 6 times per day  . levothyroxine  25 mcg Per Tube QAC breakfast  . multivitamin  1 tablet Oral QHS  . mupirocin cream   Topical Daily  . pantoprazole  40 mg Oral QHS      LOS: 9 days   Ripudeep Jenna Luo M.D. [facility] 03/22/2014, 12:52 PM Pager: [phone]  If 7PM-7AM, please contact night-coverage [email or web address] Password [email or web address]  **Disclaimer: This note was dictated with voice recognition software. Similar sounding words can inadvertently be transcribed and this note may contain transcription errors which may not have been corrected upon  publication of note.**

## 2014-03-22 NOTE — Procedures (Signed)
Patient was seen on dialysis and the procedure was supervised.  BFR 400  Via PC BP is  140/66.   Patient appears to be tolerating treatment well  Cecille Aver 03/22/2014

## 2014-03-22 NOTE — Progress Notes (Addendum)
Clinical Social Work Department CLINICAL SOCIAL WORK PLACEMENT NOTE 03/22/2014  Patient:  [name] [name], [name] [name]  Account Number:  [id number] Admit date:  03/13/2014  Clinical Social Worker:  Sharol Harness, Theresia Majors  Date/time:  03/22/2014 01:30 PM  Clinical Social Work is seeking post-discharge placement for this patient at the following level of care:   SKILLED NURSING   (*CSW will update this form in Epic as items are completed)   03/22/2014  Patient/family provided with [facility] Department of Clinical Social Work's list of facilities offering this level of care within the geographic area requested by the patient (or if unable, by the patient's family).  03/22/2014  Patient/family informed of their freedom to choose among providers that offer the needed level of care, that participate in Medicare, Medicaid or managed care program needed by the patient, have an available bed and are willing to accept the patient.  03/22/2014  Patient/family informed of [facility]' ownership interest in [facility], as well as of the fact that they are under no obligation to receive care at this facility.  PASARR submitted to E[employer] on Existing PASARR number received from [employer] on   FL2 transmitted to all facilities in geographic area requested by pt/family on  03/22/2014 FL2 transmitted to all facilities within larger geographic area on   Patient informed that his/her managed care company has contracts with or will negotiate with  certain facilities, including the following:     Patient/family informed of bed offers received:  03/23/2014 Patient chooses bed at [facility] Physician recommends and patient chooses bed at    Patient to be transferred [facility]  on   Patient to be transferred to facility by [facility]  The following physician request were entered in Epic:   Additional Comments:  Ramie Palladino, LCSWA [phone]

## 2014-03-22 NOTE — Progress Notes (Signed)
Subjective:   Feeling better, hungry.   Objective Filed Vitals:   03/22/14 0830 03/22/14 0858 03/22/14 0930 03/22/14 0959  BP: 144/62 141/65 149/72 140/66  Pulse: 82 80 80 81  Temp:      TempSrc:      Resp:      Height:      Weight:      SpO2:       Physical Exam General: alert and oriented. On HD. No acute distress Heart: RRR Lungs: CTA, unlabored Abdomen: soft, nontender +BS Extremities: no LE edema  Dialysis Access:  IJ cath  Dialysis Orders: MWF @ [facility]  64kgs 2K/2.5Ca+ 4 hr No Heparin L IJ 400/1.5  hectrol 2 mcg IV/HD No epogen or Venofer  Profle 4  Assessment/Plan:  1. PEA arrest- management per primary, cards consulted. heparin gtt. ECHO- EF 50%, CT angio- no PE. Chest xray- pleural effusion. Cards signed off 2. ESRD - MWF @ [facility], HD today, goal 2100 3. UTI- per primary, on cipro  4. Hypertension/volume - 126/59, off pressors 5. Anemia - hgb 10.8 No outpt epo or Fe. Watch CBC 6. Metabolic bone disease - Ca+ 8.7, phos 1.9. Cont hectorol q hd 7. Nutrition - renal diet, nepro, multivit 8. DM- per primary 9. CAD   Jetty DuhamelBridget Whelan, NP [facility] [phone] 03/22/2014,10:28 AM  LOS: 9 days   Pt seen, examined and agree w A/P as above.  Vinson Moselleob Jakevion Arney MD pager [phone]    cell [phone] 03/22/2014, 12:29 PM     Additional Objective Labs: Basic Metabolic Panel:  Recent Labs Lab 03/17/14 0449 03/18/14 0500  03/20/14 0347 03/21/14 0603 03/22/14 0738  NA 132* 139  < > 136* 137 133*  K 3.5* 3.8  < > 4.4 4.0 3.4*  CL 93* 98  < > 94* 96 92*  CO2 24 26  < > 27 27 25   GLUCOSE 197* 141*  < > 169* 141* 144*  BUN 69* 42*  < > 43* 25* 42*  CREATININE 4.56* 3.10*  < > 2.92* 2.24* 2.98*  CALCIUM 8.5 8.6  < > 9.1 9.2 8.7  PHOS 3.1 1.9*  --   --  3.6  --   < > = values in this interval not displayed. Liver Function Tests: No results found for this basename: AST, ALT, ALKPHOS, BILITOT, PROT, ALBUMIN,  in the last 168 hours No results found for  this basename: LIPASE, AMYLASE,  in the last 168 hours CBC:  Recent Labs Lab 03/17/14 0449 03/18/14 0500 03/19/14 0450 03/20/14 0347 03/21/14 0603  WBC 10.4 9.2 9.8 12.4* 12.4*  HGB 9.5* 9.6* 10.0* 10.8* 11.8*  HCT 30.3* 30.6* 31.8* 34.8* 37.7  MCV 93.2 93.9 94.4 95.9 95.7  PLT 216 210 261 302 326   Blood Culture    Component Value Date/Time   SDES URINE, CATHETERIZED 03/13/2014 1258   SPECREQUEST NONE 03/13/2014 1258   CULT  Value: CITROBACTER FREUNDII Performed at [facility] 03/13/2014 1258   REPTSTATUS 03/16/2014 FINAL 03/13/2014 1258    Cardiac Enzymes: No results found for this basename: CKTOTAL, CKMB, CKMBINDEX, TROPONINI,  in the last 168 hours CBG:  Recent Labs Lab 03/21/14 1156 03/21/14 1635 03/21/14 2006 03/21/14 2358 03/22/14 0421  GLUCAP 221* 141* 210* 114* 115*   Iron Studies: No results found for this basename: IRON, TIBC, TRANSFERRIN, FERRITIN,  in the last 72 hours @lablastinr3 @ Studies/Results: No results found. Medications: . sodium chloride 10 mL/hr at 03/20/14 1100   . antiseptic oral rinse  15 mL Mouth Rinse BID  . aspirin  81 mg Oral Daily  . atorvastatin  40 mg Per Tube q1800  . carvedilol  3.125 mg Oral BID WC  . ciprofloxacin  500 mg Oral Q24 Hr x 2  . feeding supplement (NEPRO CARB STEADY)  237 mL Oral BID BM  . heparin subcutaneous  5,000 Units Subcutaneous 3 times per day  . insulin aspart  0-9 Units Subcutaneous 6 times per day  . levothyroxine  25 mcg Per Tube QAC breakfast  . multivitamin  1 tablet Oral QHS  . mupirocin cream   Topical Daily  . pantoprazole  40 mg Oral QHS

## 2014-03-23 ENCOUNTER — Inpatient Hospital Stay (HOSPITAL_COMMUNITY): Payer: Medicare Other

## 2014-03-23 LAB — GLUCOSE, CAPILLARY
GLUCOSE-CAPILLARY: 119 mg/dL — AB (ref 70–99)
GLUCOSE-CAPILLARY: 245 mg/dL — AB (ref 70–99)
Glucose-Capillary: 113 mg/dL — ABNORMAL HIGH (ref 70–99)

## 2014-03-23 MED ORDER — NEPRO/CARBSTEADY PO LIQD: 237.0000 mL | Freq: Two times a day (BID) | ORAL | Status: DC

## 2014-03-23 MED ORDER — NEPRO/CARBSTEADY PO LIQD: 237.0000 mL | ORAL | Status: DC | PRN

## 2014-03-23 MED ORDER — MUPIROCIN CALCIUM 2 % EX CREA: TOPICAL_CREAM | Freq: Every day | CUTANEOUS | Status: AC

## 2014-03-23 MED ORDER — ALBUTEROL SULFATE 0.63 MG/3ML IN NEBU: 1.0000 | INHALATION_SOLUTION | Freq: Four times a day (QID) | RESPIRATORY_TRACT | Status: DC | PRN

## 2014-03-23 MED ORDER — HEPARIN SOD (PORK) LOCK FLUSH 100 UNIT/ML IV SOLN: 500.0000 [IU] | INTRAVENOUS | Status: DC | PRN

## 2014-03-23 MED ORDER — OXYCODONE-ACETAMINOPHEN 5-325 MG PO TABS
1.0000 | ORAL_TABLET | Freq: Once | ORAL | Status: AC
  Administered 2014-03-23: 1 via ORAL
  Filled 2014-03-23: qty 1

## 2014-03-23 MED ORDER — BUDESONIDE 0.25 MG/2ML IN SUSP: 0.2500 mg | Freq: Two times a day (BID) | RESPIRATORY_TRACT | Status: AC

## 2014-03-23 NOTE — Progress Notes (Deleted)
Pt was referred by nurse for a Spiritual Consult. Chaplin went to visit pt. However, due to infectious issues, and chaplain has been approved for a specific gear for this particular calls and it was not available, chaplain was advised by nurse to not enter room. Chaplain will refer this consult to another chaplain that can wear the mask.

## 2014-03-23 NOTE — Progress Notes (Signed)
Patient noted to have fine crackles bilaterally in upper lungs, NP K.Schorr notified, orders given for Chest X-ray. Patient also C/O pain from prior CPR,with no relief from tylenol, orders given for 1 Percocet. Will continue to monitor patient.

## 2014-03-23 NOTE — Progress Notes (Signed)
Patient being discharged to SNF.  Report called to Nurse Deb at [facility].  Awaiting [facility] for transport.  Colman Cater

## 2014-03-23 NOTE — Progress Notes (Signed)
Physical Therapy Treatment Patient Details Name: [name] [name] MRN: [id number] DOB: [birth year] Today's Date: 04/01/2014    History of Present Illness admitted S/p cardiac arrest with hypotension/ transient bradycardia/brief CPR on 02/24/14    PT Comments    Progressing towards goal. Pt fatigues quickly with minimal activity.  Follow Up Recommendations        Equipment Recommendations       Recommendations for Other Services       Precautions / Restrictions Precautions Precautions: Fall (oxygen dependent)    Mobility  Bed Mobility Overal bed mobility: Modified Independent       Supine to sit: Modified independent (Device/Increase time)        Transfers Overall transfer level: Needs assistance Equipment used: None Transfers: Sit to/from Stand Sit to Stand: Min assist         General transfer comment: Pt prefers to not use RW  Ambulation/Gait Ambulation/Gait assistance: Min assist Ambulation Distance (Feet): 8 Feet Assistive device: 1 person hand held assist Gait Pattern/deviations: Decreased stride length Gait velocity: decreased   General Gait Details: pt reports limited mobility/gait prior to admission   Stairs            Wheelchair Mobility    Modified Rankin (Stroke Patients Only)       Balance Overall balance assessment: Needs assistance Sitting-balance support: Feet unsupported Sitting balance-Leahy Scale: Good Sitting balance - Comments: NT greater than good     Standing balance-Leahy Scale: Poor Standing balance comment: Unsteady on feet, utilized staeeping strategy with a standing loss of balance                    Cognition Arousal/Alertness: Awake/alert Behavior During Therapy: WFL for tasks assessed/performed Overall Cognitive Status: Within Functional Limits for tasks assessed                      Exercises General Exercises - Lower Extremity Ankle Circles/Pumps: AROM;Both;5 reps;Seated Long Arc Quad:  AROM;10 reps;Sidelying;Both Hip Flexion/Marching: AROM;Both;10 reps;Seated    General Comments General comments (skin integrity, edema, etc.): Pt agreeable to a little activity, but fatigued quicly with minimal activity. Did not wnt to attempt second walk.       Pertinent Vitals/Pain C/o being sore throughout upper body from CPR.      Home Living                      Prior Function            PT Goals (current goals can now be found in the care plan section) Progress towards PT goals: Progressing toward goals    Frequency       PT Plan Current plan remains appropriate    Co-evaluation             End of Session Equipment Utilized During Treatment: Oxygen Activity Tolerance: Patient limited by fatigue Patient left: in bed;with call bell/phone within reach     Time: 1202-1222 PT Time Calculation (min): 20 min  Charges:  $Gait Training: 8-22 mins                    G CodesRoseanna Rainbow [facility] 04-01-2014, 12:42 PM Lavona Mound, PT  [phone] 04-01-14

## 2014-03-23 NOTE — Progress Notes (Signed)
Subjective:   Feeling good  Objective Filed Vitals:   03/22/14 1740 03/22/14 2115 03/23/14 0228 03/23/14 0421  BP: 154/68 131/71  155/70  Pulse: 96 86  85  Temp:  98.8 F (37.1 C)  98.7 F (37.1 C)  TempSrc:  Oral  Oral  Resp:  18  20  Height:      Weight:   61.2 kg (134 lb 14.7 oz)   SpO2:  99%  92%   Physical Exam General: alert, in no acute distress Heart: RRR Lungs: CTA, unlabored Abdomen: soft, nontender +BS Extremities: no LE edema  Dialysis Access:  IJ cath  Dialysis Orders: MWF @ [facility]  64kgs 2K/2.5Ca+ 4 hr No Heparin L IJ 400/1.5  hectrol 2 mcg IV/HD No epogen or Venofer  Profle 4  Assessment/Plan:  1. PEA arrest- management per primary, cards consulted. ECHO- EF 50%, CT angio- no PE. Chest xray- pleural effusion. Cards signed off 2. ESRD - MWF @ [facility], HD today, goal 2100 3. UTI- per primary, on cipro  4. Hypertension/volume - 126/59, off pressors 5. Anemia - hgb 10.8 No outpt epo or Fe. Watch CBC 6. Metabolic bone disease - Ca+ 8.7, phos 1.9. Cont hectorol q hd 7. Nutrition - renal diet, nepro, multivit 8. DM- per primary 9. CAD 10. [place]  Rob Skarlette Lattner MD pager [phone]    cell [phone] 03/23/2014, 11:39 AM   Additional Objective Labs: Basic Metabolic Panel:  Recent Labs Lab 03/17/14 0449 03/18/14 0500  03/20/14 0347 03/21/14 0603 03/22/14 0738  NA 132* 139  < > 136* 137 133*  K 3.5* 3.8  < > 4.4 4.0 3.4*  CL 93* 98  < > 94* 96 92*  CO2 24 26  < > 27 27 25   GLUCOSE 197* 141*  < > 169* 141* 144*  BUN 69* 42*  < > 43* 25* 42*  CREATININE 4.56* 3.10*  < > 2.92* 2.24* 2.98*  CALCIUM 8.5 8.6  < > 9.1 9.2 8.7  PHOS 3.1 1.9*  --   --  3.6  --   < > = values in this interval not displayed. Liver Function Tests: No results found for this basename: AST, ALT, ALKPHOS, BILITOT, PROT, ALBUMIN,  in the last 168 hours No results found for this basename: LIPASE, AMYLASE,  in the last 168 hours CBC:  Recent  Labs Lab 03/17/14 0449 03/18/14 0500 03/19/14 0450 03/20/14 0347 03/21/14 0603  WBC 10.4 9.2 9.8 12.4* 12.4*  HGB 9.5* 9.6* 10.0* 10.8* 11.8*  HCT 30.3* 30.6* 31.8* 34.8* 37.7  MCV 93.2 93.9 94.4 95.9 95.7  PLT 216 210 261 302 326   Blood Culture    Component Value Date/Time   SDES URINE, CATHETERIZED 03/13/2014 1258   SPECREQUEST NONE 03/13/2014 1258   CULT  Value: CITROBACTER FREUNDII Performed at [facility] 03/13/2014 1258   REPTSTATUS 03/16/2014 FINAL 03/13/2014 1258    Cardiac Enzymes: No results found for this basename: CKTOTAL, CKMB, CKMBINDEX, TROPONINI,  in the last 168 hours CBG:  Recent Labs Lab 03/22/14 1649 03/22/14 2058 03/22/14 2346 03/23/14 0428 03/23/14 0734  GLUCAP 333* 216* 134* 119* 113*   Medications: . sodium chloride 10 mL/hr at 03/20/14 1100   . antiseptic oral rinse  15 mL Mouth Rinse BID  . aspirin  81 mg Oral Daily  . atorvastatin  40 mg Per Tube q1800  . carvedilol  3.125 mg Oral BID WC  . feeding supplement (NEPRO CARB STEADY)  237 mL Oral BID BM  . heparin subcutaneous  5,000 Units Subcutaneous 3 times per day  . insulin aspart  0-9 Units Subcutaneous 6 times per day  . levothyroxine  25 mcg Per Tube QAC breakfast  . multivitamin  1 tablet Oral QHS  . mupirocin cream   Topical Daily  . pantoprazole  40 mg Oral QHS

## 2014-03-23 NOTE — Progress Notes (Signed)
Pt was referred by nurse for spiritual consult. Pt requested prayer for spiritual and physical strength. Chaplin prayed with pt and family members. Provided emotional support for pt mother.  Cindie Crumbly, Chaplain  03/23/14 1100  Clinical Encounter Type  Visited With Patient and family together  Visit Type Spiritual support  Referral From Nurse  Consult/Referral To Chaplain  Spiritual Encounters  Spiritual Needs Prayer;Emotional  Stress Factors  Patient Stress Factors None identified  Family Stress Factors None identified

## 2014-03-23 NOTE — Progress Notes (Signed)
CSW (Clinical Child psychotherapist) prepared pt dc packet and placed with shadow chart. CSW arranged non-emergent ambulance transport at 2:30pm. Pt, pt nurse, and facility informed. CSW signing off.  Tyreese Thain, LCSWA [phone]

## 2014-04-05 ENCOUNTER — Emergency Department (HOSPITAL_COMMUNITY): Payer: Medicare Other

## 2014-04-05 ENCOUNTER — Encounter (HOSPITAL_COMMUNITY): Payer: Self-pay | Admitting: Emergency Medicine

## 2014-04-05 ENCOUNTER — Inpatient Hospital Stay (HOSPITAL_COMMUNITY)
Admission: EM | Admit: 2014-04-05 | Discharge: 2014-04-09 | DRG: 193 | Disposition: A | Discharge status: Skilled Nursing Facility | Payer: Medicare Other | Attending: Internal Medicine | Admitting: Internal Medicine

## 2014-04-05 DIAGNOSIS — N186 End stage renal disease: Secondary | ICD-10-CM

## 2014-04-05 DIAGNOSIS — R001 Bradycardia, unspecified: Secondary | ICD-10-CM

## 2014-04-05 DIAGNOSIS — I5042 Chronic combined systolic (congestive) and diastolic (congestive) heart failure: Secondary | ICD-10-CM

## 2014-04-05 DIAGNOSIS — Z992 Dependence on renal dialysis: Secondary | ICD-10-CM

## 2014-04-05 DIAGNOSIS — I739 Peripheral vascular disease, unspecified: Secondary | ICD-10-CM

## 2014-04-05 DIAGNOSIS — K219 Gastro-esophageal reflux disease without esophagitis: Secondary | ICD-10-CM | POA: Diagnosis present

## 2014-04-05 DIAGNOSIS — Z794 Long term (current) use of insulin: Secondary | ICD-10-CM

## 2014-04-05 DIAGNOSIS — I959 Hypotension, unspecified: Secondary | ICD-10-CM

## 2014-04-05 DIAGNOSIS — Z9861 Coronary angioplasty status: Secondary | ICD-10-CM

## 2014-04-05 DIAGNOSIS — N179 Acute kidney failure, unspecified: Secondary | ICD-10-CM

## 2014-04-05 DIAGNOSIS — Z833 Family history of diabetes mellitus: Secondary | ICD-10-CM

## 2014-04-05 DIAGNOSIS — E785 Hyperlipidemia, unspecified: Secondary | ICD-10-CM | POA: Diagnosis present

## 2014-04-05 DIAGNOSIS — I509 Heart failure, unspecified: Secondary | ICD-10-CM | POA: Diagnosis present

## 2014-04-05 DIAGNOSIS — A498 Other bacterial infections of unspecified site: Secondary | ICD-10-CM | POA: Diagnosis present

## 2014-04-05 DIAGNOSIS — Z7982 Long term (current) use of aspirin: Secondary | ICD-10-CM

## 2014-04-05 DIAGNOSIS — I498 Other specified cardiac arrhythmias: Secondary | ICD-10-CM | POA: Diagnosis present

## 2014-04-05 DIAGNOSIS — J189 Pneumonia, unspecified organism: Principal | ICD-10-CM

## 2014-04-05 DIAGNOSIS — E7800 Pure hypercholesterolemia, unspecified: Secondary | ICD-10-CM

## 2014-04-05 DIAGNOSIS — E662 Morbid (severe) obesity with alveolar hypoventilation: Secondary | ICD-10-CM

## 2014-04-05 DIAGNOSIS — E039 Hypothyroidism, unspecified: Secondary | ICD-10-CM

## 2014-04-05 DIAGNOSIS — S98139A Complete traumatic amputation of one unspecified lesser toe, initial encounter: Secondary | ICD-10-CM

## 2014-04-05 DIAGNOSIS — I2589 Other forms of chronic ischemic heart disease: Secondary | ICD-10-CM | POA: Diagnosis present

## 2014-04-05 DIAGNOSIS — J9600 Acute respiratory failure, unspecified whether with hypoxia or hypercapnia: Secondary | ICD-10-CM

## 2014-04-05 DIAGNOSIS — I469 Cardiac arrest, cause unspecified: Secondary | ICD-10-CM

## 2014-04-05 DIAGNOSIS — L8993 Pressure ulcer of unspecified site, stage 3: Secondary | ICD-10-CM | POA: Diagnosis present

## 2014-04-05 DIAGNOSIS — I120 Hypertensive chronic kidney disease with stage 5 chronic kidney disease or end stage renal disease: Secondary | ICD-10-CM | POA: Diagnosis present

## 2014-04-05 DIAGNOSIS — J441 Chronic obstructive pulmonary disease with (acute) exacerbation: Secondary | ICD-10-CM | POA: Diagnosis present

## 2014-04-05 DIAGNOSIS — L97409 Non-pressure chronic ulcer of unspecified heel and midfoot with unspecified severity: Secondary | ICD-10-CM | POA: Diagnosis present

## 2014-04-05 DIAGNOSIS — Z86718 Personal history of other venous thrombosis and embolism: Secondary | ICD-10-CM

## 2014-04-05 DIAGNOSIS — J9620 Acute and chronic respiratory failure, unspecified whether with hypoxia or hypercapnia: Secondary | ICD-10-CM | POA: Diagnosis present

## 2014-04-05 DIAGNOSIS — I252 Old myocardial infarction: Secondary | ICD-10-CM

## 2014-04-05 DIAGNOSIS — I10 Essential (primary) hypertension: Secondary | ICD-10-CM

## 2014-04-05 DIAGNOSIS — Z8674 Personal history of sudden cardiac arrest: Secondary | ICD-10-CM

## 2014-04-05 DIAGNOSIS — E1129 Type 2 diabetes mellitus with other diabetic kidney complication: Secondary | ICD-10-CM

## 2014-04-05 DIAGNOSIS — I2789 Other specified pulmonary heart diseases: Secondary | ICD-10-CM | POA: Diagnosis present

## 2014-04-05 DIAGNOSIS — I2510 Atherosclerotic heart disease of native coronary artery without angina pectoris: Secondary | ICD-10-CM

## 2014-04-05 DIAGNOSIS — N1330 Unspecified hydronephrosis: Secondary | ICD-10-CM

## 2014-04-05 DIAGNOSIS — Z86711 Personal history of pulmonary embolism: Secondary | ICD-10-CM

## 2014-04-05 DIAGNOSIS — E1169 Type 2 diabetes mellitus with other specified complication: Secondary | ICD-10-CM | POA: Diagnosis present

## 2014-04-05 DIAGNOSIS — Z9989 Dependence on other enabling machines and devices: Secondary | ICD-10-CM

## 2014-04-05 DIAGNOSIS — I255 Ischemic cardiomyopathy: Secondary | ICD-10-CM

## 2014-04-05 DIAGNOSIS — Z9981 Dependence on supplemental oxygen: Secondary | ICD-10-CM

## 2014-04-05 DIAGNOSIS — Z87891 Personal history of nicotine dependence: Secondary | ICD-10-CM

## 2014-04-05 DIAGNOSIS — J90 Pleural effusion, not elsewhere classified: Secondary | ICD-10-CM

## 2014-04-05 DIAGNOSIS — E872 Acidosis, unspecified: Secondary | ICD-10-CM | POA: Diagnosis present

## 2014-04-05 DIAGNOSIS — I953 Hypotension of hemodialysis: Secondary | ICD-10-CM | POA: Diagnosis present

## 2014-04-05 DIAGNOSIS — G4733 Obstructive sleep apnea (adult) (pediatric): Secondary | ICD-10-CM

## 2014-04-05 DIAGNOSIS — N390 Urinary tract infection, site not specified: Secondary | ICD-10-CM

## 2014-04-05 LAB — URINE MICROSCOPIC-ADD ON

## 2014-04-05 LAB — URINALYSIS, ROUTINE W REFLEX MICROSCOPIC
Bilirubin Urine: NEGATIVE
Glucose, UA: NEGATIVE mg/dL
Ketones, ur: NEGATIVE mg/dL
Nitrite: NEGATIVE
PROTEIN: 100 mg/dL — AB
Specific Gravity, Urine: 1.014 (ref 1.005–1.030)
UROBILINOGEN UA: 0.2 mg/dL (ref 0.0–1.0)
pH: 5 (ref 5.0–8.0)

## 2014-04-05 LAB — I-STAT ARTERIAL BLOOD GAS, ED
Acid-Base Excess: 3 mmol/L — ABNORMAL HIGH (ref 0.0–2.0)
BICARBONATE: 32.2 meq/L — AB (ref 20.0–24.0)
O2 Saturation: 99 %
PCO2 ART: 71.1 mmHg — AB (ref 35.0–45.0)
PH ART: 7.264 — AB (ref 7.350–7.450)
Patient temperature: 98.7
TCO2: 34 mmol/L (ref 0–100)
pO2, Arterial: 166 mmHg — ABNORMAL HIGH (ref 80.0–100.0)

## 2014-04-05 LAB — GLUCOSE, CAPILLARY
GLUCOSE-CAPILLARY: 207 mg/dL — AB (ref 70–99)
GLUCOSE-CAPILLARY: 220 mg/dL — AB (ref 70–99)

## 2014-04-05 LAB — COMPREHENSIVE METABOLIC PANEL
ALT: 40 U/L — ABNORMAL HIGH (ref 0–35)
AST: 57 U/L — ABNORMAL HIGH (ref 0–37)
Albumin: 3.1 g/dL — ABNORMAL LOW (ref 3.5–5.2)
Alkaline Phosphatase: 280 U/L — ABNORMAL HIGH (ref 39–117)
BILIRUBIN TOTAL: 0.3 mg/dL (ref 0.3–1.2)
BUN: 17 mg/dL (ref 6–23)
CALCIUM: 8.9 mg/dL (ref 8.4–10.5)
CHLORIDE: 97 meq/L (ref 96–112)
CO2: 30 mEq/L (ref 19–32)
CREATININE: 1.54 mg/dL — AB (ref 0.50–1.10)
GFR, EST AFRICAN AMERICAN: 38 mL/min — AB (ref >90)
GFR, EST NON AFRICAN AMERICAN: 33 mL/min — AB (ref >90)
GLUCOSE: 148 mg/dL — AB (ref 70–99)
Potassium: 4.5 mEq/L (ref 3.7–5.3)
Sodium: 137 mEq/L (ref 137–147)
Total Protein: 6.8 g/dL (ref 6.0–8.3)

## 2014-04-05 LAB — CBC WITH DIFFERENTIAL/PLATELET
Basophils Absolute: 0.1 10*3/uL (ref 0.0–0.1)
Basophils Relative: 0 % (ref 0–1)
EOS PCT: 1 % (ref 0–5)
Eosinophils Absolute: 0.1 10*3/uL (ref 0.0–0.7)
HCT: 33.7 % — ABNORMAL LOW (ref 36.0–46.0)
HEMOGLOBIN: 10.8 g/dL — AB (ref 12.0–15.0)
LYMPHS PCT: 9 % — AB (ref 12–46)
Lymphs Abs: 1.5 10*3/uL (ref 0.7–4.0)
MCH: 29.9 pg (ref 26.0–34.0)
MCHC: 32 g/dL (ref 30.0–36.0)
MCV: 93.4 fL (ref 78.0–100.0)
MONO ABS: 0.7 10*3/uL (ref 0.1–1.0)
MONOS PCT: 4 % (ref 3–12)
NEUTROS ABS: 15.5 10*3/uL — AB (ref 1.7–7.7)
Neutrophils Relative %: 86 % — ABNORMAL HIGH (ref 43–77)
Platelets: 275 10*3/uL (ref 150–400)
RBC: 3.61 MIL/uL — ABNORMAL LOW (ref 3.87–5.11)
RDW: 16 % — ABNORMAL HIGH (ref 11.5–15.5)
WBC: 17.9 10*3/uL — AB (ref 4.0–10.5)

## 2014-04-05 LAB — CBC
HEMATOCRIT: 37 % (ref 36.0–46.0)
HEMOGLOBIN: 12 g/dL (ref 12.0–15.0)
MCH: 30.2 pg (ref 26.0–34.0)
MCHC: 32.4 g/dL (ref 30.0–36.0)
MCV: 93.2 fL (ref 78.0–100.0)
Platelets: 259 10*3/uL (ref 150–400)
RBC: 3.97 MIL/uL (ref 3.87–5.11)
RDW: 16 % — ABNORMAL HIGH (ref 11.5–15.5)
WBC: 17 10*3/uL — AB (ref 4.0–10.5)

## 2014-04-05 LAB — CREATININE, SERUM
CREATININE: 1.64 mg/dL — AB (ref 0.50–1.10)
GFR calc non Af Amer: 31 mL/min — ABNORMAL LOW (ref >90)
GFR, EST AFRICAN AMERICAN: 36 mL/min — AB (ref >90)

## 2014-04-05 LAB — STREP PNEUMONIAE URINARY ANTIGEN: Strep Pneumo Urinary Antigen: NEGATIVE

## 2014-04-05 LAB — I-STAT CG4 LACTIC ACID, ED: Lactic Acid, Venous: 0.33 mmol/L — ABNORMAL LOW (ref 0.5–2.2)

## 2014-04-05 LAB — MRSA PCR SCREENING: MRSA BY PCR: NEGATIVE

## 2014-04-05 MED ORDER — LEVOTHYROXINE SODIUM 25 MCG PO TABS
25.0000 ug | ORAL_TABLET | Freq: Every day | ORAL | Status: DC
  Administered 2014-04-06 – 2014-04-09 (×4): 25 ug via ORAL
  Filled 2014-04-05 (×6): qty 1

## 2014-04-05 MED ORDER — METHYLPREDNISOLONE SODIUM SUCC 125 MG IJ SOLR
125.0000 mg | Freq: Once | INTRAMUSCULAR | Status: AC
  Administered 2014-04-05: 125 mg via INTRAVENOUS
  Filled 2014-04-05: qty 2

## 2014-04-05 MED ORDER — SODIUM CHLORIDE 0.9 % IV SOLN: 1000.0000 mL | Freq: Once | INTRAVENOUS | Status: DC

## 2014-04-05 MED ORDER — TIOTROPIUM BROMIDE MONOHYDRATE 18 MCG IN CAPS: 18.0000 ug | ORAL_CAPSULE | Freq: Every day | RESPIRATORY_TRACT | Status: DC

## 2014-04-05 MED ORDER — DEXTROSE 5 % IV SOLN
1.0000 g | INTRAVENOUS | Status: DC
  Administered 2014-04-06 – 2014-04-07 (×2): 1 g via INTRAVENOUS
  Filled 2014-04-05 (×4): qty 1

## 2014-04-05 MED ORDER — PIPERACILLIN-TAZOBACTAM 3.375 G IVPB 30 MIN
3.3750 g | Freq: Once | INTRAVENOUS | Status: AC
  Administered 2014-04-05: 3.375 g via INTRAVENOUS
  Filled 2014-04-05: qty 50

## 2014-04-05 MED ORDER — ASPIRIN EC 81 MG PO TBEC
81.0000 mg | DELAYED_RELEASE_TABLET | Freq: Every day | ORAL | Status: DC
  Administered 2014-04-05 – 2014-04-09 (×5): 81 mg via ORAL
  Filled 2014-04-05 (×5): qty 1

## 2014-04-05 MED ORDER — ALBUTEROL (5 MG/ML) CONTINUOUS INHALATION SOLN
10.0000 mg/h | INHALATION_SOLUTION | Freq: Once | RESPIRATORY_TRACT | Status: AC
  Administered 2014-04-05: 10 mg/h via RESPIRATORY_TRACT
  Filled 2014-04-05: qty 20

## 2014-04-05 MED ORDER — MIDODRINE HCL 5 MG PO TABS
5.0000 mg | ORAL_TABLET | ORAL | Status: DC
  Administered 2014-04-06: 5 mg via ORAL
  Filled 2014-04-05 (×2): qty 1

## 2014-04-05 MED ORDER — AZTREONAM 2 G IJ SOLR: 2.0000 g | Freq: Three times a day (TID) | INTRAMUSCULAR | Status: DC

## 2014-04-05 MED ORDER — ISOSORBIDE MONONITRATE 15 MG HALF TABLET
15.0000 mg | ORAL_TABLET | Freq: Every day | ORAL | Status: DC
  Filled 2014-04-05 (×2): qty 1

## 2014-04-05 MED ORDER — BUDESONIDE 0.25 MG/2ML IN SUSP
0.2500 mg | Freq: Two times a day (BID) | RESPIRATORY_TRACT | Status: DC
  Administered 2014-04-06 – 2014-04-09 (×6): 0.25 mg via RESPIRATORY_TRACT
  Filled 2014-04-05 (×9): qty 2

## 2014-04-05 MED ORDER — VANCOMYCIN HCL IN DEXTROSE 750-5 MG/150ML-% IV SOLN
750.0000 mg | INTRAVENOUS | Status: DC
  Administered 2014-04-06 – 2014-04-07 (×2): 750 mg via INTRAVENOUS
  Filled 2014-04-05 (×2): qty 150

## 2014-04-05 MED ORDER — METHYLPREDNISOLONE SODIUM SUCC 125 MG IJ SOLR
60.0000 mg | Freq: Two times a day (BID) | INTRAMUSCULAR | Status: DC
  Administered 2014-04-05 – 2014-04-06 (×3): 60 mg via INTRAVENOUS
  Filled 2014-04-05 (×4): qty 0.96

## 2014-04-05 MED ORDER — SODIUM CHLORIDE 0.9 % IV SOLN: 1000.0000 mL | INTRAVENOUS | Status: DC

## 2014-04-05 MED ORDER — PANTOPRAZOLE SODIUM 40 MG PO TBEC
40.0000 mg | DELAYED_RELEASE_TABLET | Freq: Every day | ORAL | Status: DC
  Administered 2014-04-05 – 2014-04-09 (×5): 40 mg via ORAL
  Filled 2014-04-05 (×5): qty 1

## 2014-04-05 MED ORDER — NITROGLYCERIN 0.4 MG SL SUBL: 0.4000 mg | SUBLINGUAL_TABLET | SUBLINGUAL | Status: DC | PRN

## 2014-04-05 MED ORDER — HEPARIN SODIUM (PORCINE) 5000 UNIT/ML IJ SOLN
5000.0000 [IU] | Freq: Three times a day (TID) | INTRAMUSCULAR | Status: DC
  Administered 2014-04-05 – 2014-04-09 (×11): 5000 [IU] via SUBCUTANEOUS
  Filled 2014-04-05 (×16): qty 1

## 2014-04-05 MED ORDER — NEPRO/CARBSTEADY PO LIQD
237.0000 mL | Freq: Two times a day (BID) | ORAL | Status: DC
  Administered 2014-04-06 – 2014-04-08 (×6): 237 mL via ORAL

## 2014-04-05 MED ORDER — INSULIN ASPART 100 UNIT/ML ~~LOC~~ SOLN
5.0000 [IU] | Freq: Three times a day (TID) | SUBCUTANEOUS | Status: DC
  Administered 2014-04-05 – 2014-04-07 (×5): 5 [IU] via SUBCUTANEOUS

## 2014-04-05 MED ORDER — SIMVASTATIN 80 MG PO TABS
80.0000 mg | ORAL_TABLET | Freq: Every evening | ORAL | Status: DC
  Administered 2014-04-05 – 2014-04-08 (×4): 80 mg via ORAL
  Filled 2014-04-05 (×5): qty 1

## 2014-04-05 MED ORDER — ALBUTEROL SULFATE (2.5 MG/3ML) 0.083% IN NEBU: 2.5000 mg | INHALATION_SOLUTION | Freq: Four times a day (QID) | RESPIRATORY_TRACT | Status: DC

## 2014-04-05 MED ORDER — SODIUM CHLORIDE 0.9 % IV BOLUS (SEPSIS)
500.0000 mL | Freq: Once | INTRAVENOUS | Status: AC
  Administered 2014-04-05: 500 mL via INTRAVENOUS

## 2014-04-05 MED ORDER — AZTREONAM 1 G IJ SOLR
1.0000 g | INTRAMUSCULAR | Status: AC
  Administered 2014-04-05: 1 g via INTRAVENOUS
  Filled 2014-04-05: qty 1

## 2014-04-05 MED ORDER — TIOTROPIUM BROMIDE MONOHYDRATE 18 MCG IN CAPS
18.0000 ug | ORAL_CAPSULE | Freq: Every day | RESPIRATORY_TRACT | Status: DC
  Administered 2014-04-06 – 2014-04-09 (×3): 18 ug via RESPIRATORY_TRACT
  Filled 2014-04-05: qty 5

## 2014-04-05 MED ORDER — VANCOMYCIN HCL IN DEXTROSE 1-5 GM/200ML-% IV SOLN
1000.0000 mg | Freq: Once | INTRAVENOUS | Status: AC
  Administered 2014-04-05: 1000 mg via INTRAVENOUS
  Filled 2014-04-05: qty 200

## 2014-04-05 MED ORDER — INSULIN ASPART 100 UNIT/ML ~~LOC~~ SOLN
0.0000 [IU] | Freq: Three times a day (TID) | SUBCUTANEOUS | Status: DC
  Administered 2014-04-05: 3 [IU] via SUBCUTANEOUS
  Administered 2014-04-06 (×2): 5 [IU] via SUBCUTANEOUS
  Administered 2014-04-07: 7 [IU] via SUBCUTANEOUS
  Administered 2014-04-08: 3 [IU] via SUBCUTANEOUS
  Administered 2014-04-08: 7 [IU] via SUBCUTANEOUS
  Administered 2014-04-08: 2 [IU] via SUBCUTANEOUS
  Administered 2014-04-09: 5 [IU] via SUBCUTANEOUS

## 2014-04-05 MED ORDER — ALBUTEROL SULFATE (2.5 MG/3ML) 0.083% IN NEBU
2.5000 mg | INHALATION_SOLUTION | Freq: Four times a day (QID) | RESPIRATORY_TRACT | Status: DC
  Administered 2014-04-05 – 2014-04-08 (×11): 2.5 mg via RESPIRATORY_TRACT
  Filled 2014-04-05 (×10): qty 3

## 2014-04-05 MED ORDER — CALCIUM ACETATE 667 MG PO CAPS
667.0000 mg | ORAL_CAPSULE | Freq: Three times a day (TID) | ORAL | Status: DC
  Administered 2014-04-05 – 2014-04-09 (×11): 667 mg via ORAL
  Filled 2014-04-05 (×14): qty 1

## 2014-04-05 MED ORDER — CYCLOBENZAPRINE HCL 10 MG PO TABS: 10.0000 mg | ORAL_TABLET | Freq: Three times a day (TID) | ORAL | Status: DC | PRN

## 2014-04-05 MED ORDER — INSULIN GLARGINE 100 UNIT/ML ~~LOC~~ SOLN
10.0000 [IU] | Freq: Every day | SUBCUTANEOUS | Status: DC
  Administered 2014-04-05: 10 [IU] via SUBCUTANEOUS
  Filled 2014-04-05 (×2): qty 0.1

## 2014-04-05 MED ORDER — VANCOMYCIN HCL 1000 MG IV SOLR
1500.0000 mg | Freq: Once | INTRAVENOUS | Status: DC
  Filled 2014-04-05: qty 1500

## 2014-04-05 MED ORDER — RENA-VITE PO TABS
1.0000 | ORAL_TABLET | Freq: Every day | ORAL | Status: DC
  Administered 2014-04-05 – 2014-04-06 (×2): 1 via ORAL
  Administered 2014-04-07: 12:00:00 via ORAL
  Administered 2014-04-08 – 2014-04-09 (×2): 1 via ORAL
  Filled 2014-04-05 (×5): qty 1

## 2014-04-05 MED ORDER — INSULIN ASPART 100 UNIT/ML ~~LOC~~ SOLN
0.0000 [IU] | Freq: Every day | SUBCUTANEOUS | Status: DC
  Administered 2014-04-05: 2 [IU] via SUBCUTANEOUS
  Administered 2014-04-06: 4 [IU] via SUBCUTANEOUS

## 2014-04-05 MED ORDER — VANCOMYCIN HCL 500 MG IV SOLR
500.0000 mg | INTRAVENOUS | Status: AC
  Administered 2014-04-05: 500 mg via INTRAVENOUS
  Filled 2014-04-05: qty 500

## 2014-04-05 NOTE — ED Provider Notes (Signed)
CSN: [id number]     Arrival date & time 04/05/14  0932 History   First MD Initiated Contact with Patient 04/05/14 [phone]     Chief Complaint  Patient presents with  . Hypotension     (Consider location/radiation/quality/duration/timing/severity/associated sxs/prior Treatment) HPI Comments: Hypotensive at dialysis with SBPs in the 70s. Given 800cc of fluid back - SBPs in the 130s. Had 1:20 of dialysis treatment. Recent admission for HypoTN, PEA arrest with ROSC and hypothermia protocol. Also had Citrobacter UTI.  Patient is a 73 y.o. female presenting with cough. The history is provided by the patient and the EMS personnel.  Cough Cough characteristics:  Productive Sputum characteristics:  Green Severity:  Moderate Onset quality:  Gradual Timing:  Constant Progression:  Worsening Chronicity:  Recurrent Smoker: no   Context: not sick contacts and not upper respiratory infection   Context comment:  Recent intubation Relieved by:  Nothing Worsened by:  Nothing tried Associated symptoms: shortness of breath   Associated symptoms: no chest pain and no fever     Past Medical History  Diagnosis Date  . Hypothyroidism   . Colitis 10/2011    C diff.   . Coronary artery disease 3/13    3V CAD at cath, failed RCA PCI  . Hypertension   . GERD (gastroesophageal reflux disease)   . Peripheral vascular disease     claudication.   Marland Kitchen Hx of pulmonary embolus   . COPD (chronic obstructive pulmonary disease)     Nocturnal O2.   . Shortness of breath     uses O2 as needed  . Critical lower limb ischemia   . DVT (deep venous thrombosis) 2008    "right"  . Pneumonia     "several times"  . CHF (congestive heart failure)     Volume mgt with HD  . Hypercholesteremia   . GI bleed March 2015  . ESRD on hemodialysis     Started HD in December 2013.  Cause of ESRD unknown.  Gets HD in [employer] [facility] on MWF schedule. She had a right upper arm access which failed just short of one year.  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  She says she has "poor circulation" in the legs so they won't do a leg access. She remains catheter dependent as of   . On home oxygen therapy     "24/7" (03/22/2014)  . Myocardial infarction ? date; 03/13/2014  . Type II diabetes mellitus     insulin requiring.   . Anemia   . History of blood transfusion     "twice" (03/22/2014)  . PEA (Pulseless electrical activity) 03/13/2014    [name] [name] 03/13/2014   Past Surgical History  Procedure Laterality Date  . Tubal ligation    . Toe amputation Right     5th toe  . Tonsillectomy    . Insertion of dialysis catheter Left     chest  . Av fistula placement Right [id number]    RUE  . Bascilic vein transposition  May 18, 2012    First stage  . Bascilic vein transposition Left 07/27/12    Basilic Vein Transposition  . Arch aortogram  08/19/2012    Procedure: ARCH AORTOGRAM;  Surgeon: Fransisco Hertz, MD;  Location: [facility] OR;  Service: Vascular;  Laterality: N/A;  . Insertion of dialysis catheter  08/21/2012    Procedure: INSERTION OF DIALYSIS CATHETER;  Surgeon: Sherren Kerns, MD;  Location: [facility] OR;  Service: Vascular;  Laterality: Left;  internal jugular  . Esophagogastroduodenoscopy N/A 01/06/2014    Procedure: ESOPHAGOGASTRODUODENOSCOPY (EGD);  Surgeon: Hilarie Fredrickson, MD;  [facility] ENDOSCOPY;  Service: Endoscopy;  Laterality: N/A;  . Cardiac catheterization  02/04/2012    LAD 40/99%, CRX OK, OM1 90% (small), RCA 90%/subtotal, failed PCI. Good L-R collaterals.   Family History  Problem Relation Age of Onset  . Diabetes type II    . Hypertension    . Diabetes Mother   . Kidney disease Mother   . Diabetes Father   . Anesthesia problems Neg Hx    History  Substance Use Topics  . Smoking status: Former Smoker -- 1.00 packs/day for 40 years    Types: Cigarettes    Quit date: 06/11/2011  . Smokeless tobacco: Never Used  . Alcohol Use: No    OB History   Grav Para Term Preterm Abortions TAB SAB Ect Mult Living                 Review of Systems  Constitutional: Negative for fever.  Respiratory: Positive for cough and shortness of breath.   Cardiovascular: Negative for chest pain.  Gastrointestinal: Negative for nausea and vomiting.  All other systems reviewed and are negative.     Allergies  Procaine hcl  Home Medications   Prior to Admission medications   Medication Sig Start Date End Date Taking? Authorizing Provider  albuterol (PROVENTIL) (5 MG/ML) 0.5% nebulizer solution Take 0.5 mLs (2.5 mg total) by nebulization every 6 (six) hours as needed for wheezing or shortness of breath. 03/01/14   Esperanza Sheets, MD  amLODipine (NORVASC) 5 MG tablet Take 1 tablet (5 mg total) by mouth daily. 03/04/14   Jeralyn Bennett, MD  aspirin EC 81 MG tablet Take 81 mg by mouth daily.    Historical Provider, MD  budesonide (PULMICORT) 0.25 MG/2ML nebulizer solution Take 2 mLs (0.25 mg total) by nebulization 2 (two) times daily. 03/23/14   Ripudeep Jenna Luo, MD  calcium acetate (PHOSLO) 667 MG capsule Take 667 mg by mouth 3 (three) times daily with meals.    Historical Provider, MD  carvedilol (COREG) 3.125 MG tablet Take 3.125 mg by mouth 2 (two) times daily with a meal.    Historical Provider, MD  cyclobenzaprine (FLEXERIL) 10 MG tablet Take 1 tablet (10 mg total) by mouth 3 (three) times daily as needed for muscle spasms. 03/01/14   Esperanza Sheets, MD  insulin aspart (NOVOLOG) 100 UNIT/ML injection Inject 2 Units into the skin 3 (three) times daily with meals. 12/30/13   Alison Murray, MD  insulin glargine (LANTUS) 100 UNIT/ML injection Inject 0.1 mLs (10 Units total) into the skin at bedtime. 03/01/14   Esperanza Sheets, MD  isosorbide mononitrate (IMDUR) 30 MG 24 hr tablet Take 15 mg by mouth daily.    Historical Provider, MD  levothyroxine (SYNTHROID, LEVOTHROID) 25 MCG tablet Take 25 mcg by mouth daily.     Historical Provider, MD   mupirocin cream (BACTROBAN) 2 % Apply topically daily. To left heel 03/23/14   Ripudeep Jenna Luo, MD  nitroGLYCERIN (NITROSTAT) 0.4 MG SL tablet Place 0.4 mg under the tongue every 5 (five) minutes as needed for chest pain. For chest pain.    Historical Provider, MD  Nutritional Supplements (FEEDING SUPPLEMENT, NEPRO CARB STEADY,) LIQD Take 237 mLs by mouth 2 (two) times daily between meals. 03/23/14   Ripudeep K  Rai, MD  pantoprazole (PROTONIX) 40 MG tablet Take 40 mg by mouth daily.     Historical Provider, MD  Probiotic Product (PROBIOTIC PO) Take 1 capsule by mouth daily as needed (digestion).     Historical Provider, MD  simvastatin (ZOCOR) 80 MG tablet Take 1 tablet (80 mg total) by mouth every evening. 01/08/14   Hollice EspySendil K Krishnan, MD  tiotropium (SPIRIVA) 18 MCG inhalation capsule Place 1 capsule (18 mcg total) into inhaler and inhale daily. 11/13/13   Rhetta MuraJai-Gurmukh Samtani, MD   BP 97/59  Pulse 117  Temp(Src) 98.7 F (37.1 C) (Rectal)  Resp 20  SpO2 100% Physical Exam  Nursing note and vitals reviewed. Constitutional: She is oriented to person, place, and time. She appears well-developed and well-nourished. No distress.  HENT:  Head: Normocephalic and atraumatic.  Mouth/Throat: Oropharynx is clear and moist.  Eyes: EOM are normal. Pupils are equal, round, and reactive to light.  Neck: Normal range of motion. Neck supple.  Cardiovascular: Normal rate and regular rhythm.  Exam reveals no friction rub.   No murmur heard. Pulmonary/Chest: She is in respiratory distress (mild). She has decreased breath sounds (diffuse, L>R). She has wheezes (scattered). She has no rales.  Abdominal: Soft. She exhibits no distension. There is no tenderness. There is no rebound.  Musculoskeletal: Normal range of motion. She exhibits no edema.  Neurological: She is alert and oriented to person, place, and time.  Skin: She is not diaphoretic.    ED Course  Procedures (including critical care time) Labs  Review Labs Reviewed  CULTURE, BLOOD (ROUTINE X 2)  CULTURE, BLOOD (ROUTINE X 2)  URINE CULTURE  CBC WITH DIFFERENTIAL  COMPREHENSIVE METABOLIC PANEL  URINALYSIS, ROUTINE W REFLEX MICROSCOPIC  BLOOD GAS, ARTERIAL  CBG MONITORING, ED  I-STAT CG4 LACTIC ACID, ED    Imaging Review Dg Chest Port 1 View  04/05/2014   CLINICAL DATA:  Shortness of breath, dialysis  EXAM: PORTABLE CHEST - 1 VIEW  COMPARISON:  03/23/2014  FINDINGS: Moderate layering left pleural effusion, increased. No frank interstitial edema. No pneumothorax.  Cardiomegaly.  Left IJ dual lumen dialysis catheter terminating in the upper right atrium.  IMPRESSION: Moderate layering left pleural effusion, increased.   Electronically Signed   By: Charline BillsSriyesh  Krishnan M.D.   On: 04/05/2014 09:57     EKG Interpretation   Date/Time:  Wednesday April 05 2014 10:09:40 EDT Ventricular Rate:  106 PR Interval:  143 QRS Duration: 103 QT Interval:  341 QTC Calculation: 453 R Axis:   61 Text Interpretation:  Sinus tachycardia LVH with secondary repolarization  abnormality Anterior Q waves, possibly due to LVH Simlar to previous  Confirmed by Gwendolyn GrantWALDEN  MD, Jadian Karman ([id number]) on 04/05/2014 3:15:13 PM      CRITICAL CARE Performed by: Dagmar HaitWilliam Demiya Magno   Total critical care time: 30 minutes  Critical care time was exclusive of separately billable procedures and treating other patients.  Critical care was necessary to treat or prevent imminent or life-threatening deterioration.  Critical care was time spent personally by me on the following activities: development of treatment plan with patient and/or surrogate as well as nursing, discussions with consultants, evaluation of patient's response to treatment, examination of patient, obtaining history from patient or surrogate, ordering and performing treatments and interventions, ordering and review of laboratory studies, ordering and review of radiographic studies, pulse oximetry and  re-evaluation of patient's condition.   MDM   Final diagnoses:  Encounter for continuous renal replacement therapy (CRRT) for acute  renal failure  Acute respiratory failure  Bradycardia  CAD (coronary artery disease)  Chronic combined systolic and diastolic CHF (congestive heart failure)  DM (diabetes mellitus), type 2 with renal complications  ESRD on hemodialysis  History of DVT (deep vein thrombosis)  History of pulmonary embolus (PE)  HTN (hypertension)  Hypercholesterolemia  Hypotension  Peripheral arterial disease    22F with hx of COPD, CHF, recent citrobacter UTI, recent HypoTN/Bradycardia s/p PEA arrest with ROSC presents with hypotension from dialysis. Patient was 1 hr and 20 minutes into treatment and had some hypotension - systolics in the 70s. Patient was given 800cc of fluid back and systolics returned into the 130s. She is hypoxic with EMS, 6 L [facility] and only satting in the 90s. Patient states some green mucus with her productive cough, which is new for her. Denies N/V/D. No CP. Here initially hypotensive in the 90s, with HRs in the 110s. Afebrile, no hypothermia. Will give small amount of fluid for BP support. Will obtain cultures, CXR, broad spectrum antibiotics. Concern for possible HCAP.  Patient is an extremely poor historian.  ABG shows respiratory acidosis with pH of 7.264, pCO2 71. Has elevated bicarb, showing some chronic CO2 retention, however with place on BiPap and start albuterol with her hx of COPD. CXR shows L pleural effusion. Dr. Thedore Mins with [facility] to admit to [facility], I consulted pulmonology to review her CXR for possible thoracentesis.  Dagmar Hait, MD 04/05/14 1515

## 2014-04-05 NOTE — ED Notes (Signed)
Admitting physician at bedside. Vital signs stable. Pt remains on Bipap, no signs of distress noted at present.

## 2014-04-05 NOTE — ED Notes (Signed)
Pt presents to department for evaluation of hypotension. Pt went to hemodialysis this morning, BP dropped to 70 systolic. History of x2 recent cardiac arrests. Pt received NS this morning. CBG 131. 20g RAC. Pt is alert and oriented x4 upon arrival.

## 2014-04-05 NOTE — Progress Notes (Signed)
ANTIBIOTIC CONSULT NOTE - INITIAL  Pharmacy Consult for Vancomycin Indication: pneumonia  Allergies  Allergen Reactions  . Procaine Hcl Nausea And Vomiting and Other (See Comments)    [facility] of "comatose"    Patient Measurements: Height: 5' 4.96" (165 cm) IBW/kg (Calculated) : 56.91 Adjusted Body Weight:    Vital Signs: Temp: 98.7 F (37.1 C) (06/03 0936) Temp src: Rectal (06/03 0936) BP: 151/62 mmHg (06/03 1340) Pulse Rate: 103 (06/03 1340) Intake/Output from previous day:   Intake/Output from this shift:    Labs:  Recent Labs  04/05/14 1100  WBC 17.9*  HGB 10.8*  PLT 275  CREATININE 1.54*   The CrCl is unknown because both a height and weight (above a minimum accepted value) are required for this calculation. No results found for this basename: VANCOTROUGH, Leodis BinetVANCOPEAK, VANCORANDOM, GENTTROUGH, GENTPEAK, GENTRANDOM, TOBRATROUGH, TOBRAPEAK, TOBRARND, AMIKACINPEAK, AMIKACINTROU, AMIKACIN,  in the last 72 hours   Microbiology: Recent Results (from the past 720 hour(s))  MRSA PCR SCREENING     Status: None   Collection Time    03/13/14 12:58 PM      Result Value Ref Range Status   MRSA by PCR NEGATIVE  NEGATIVE Final   Comment:            The GeneXpert MRSA Assay (FDA     approved for NASAL specimens     only), is one component of a     comprehensive MRSA colonization     surveillance program. It is not     intended to diagnose MRSA     infection nor to guide or     monitor treatment for     MRSA infections.  URINE CULTURE     Status: None   Collection Time    03/13/14 12:58 PM      Result Value Ref Range Status   Specimen Description URINE, CATHETERIZED   Final   Special Requests NONE   Final   Culture  Setup Time     Final   Value: 03/13/2014 14:12     Performed at [facility]   Colony Count     Final   Value: >=100,000 COLONIES/ML     Performed at [facility]   Culture     Final   Value: CITROBACTER  FREUNDII     Performed at [facility]   Report Status 03/16/2014 FINAL   Final   Organism ID, Bacteria CITROBACTER FREUNDII   Final    Medical History: Past Medical History  Diagnosis Date  . Hypothyroidism   . Colitis 10/2011    C diff.   . Coronary artery disease 3/13    3V CAD at cath, failed RCA PCI  . Hypertension   . GERD (gastroesophageal reflux disease)   . Peripheral vascular disease     claudication.   Marland Kitchen. Hx of pulmonary embolus   . COPD (chronic obstructive pulmonary disease)     Nocturnal O2.   . Shortness of breath     uses O2 as needed  . Critical lower limb ischemia   . DVT (deep venous thrombosis) 2008    "right"  . Pneumonia     "several times"  . CHF (congestive heart failure)     Volume mgt with HD  . Hypercholesteremia   . GI bleed March 2015  . ESRD on hemodialysis     Started HD in December 2013.  Cause of ESRD unknown.  [place] in [place] on  MWF schedule. She had a right upper arm access which failed just short of one year.  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  She says she has "poor circulation" in the legs so they won't do a leg access. She remains catheter dependent as of   . On home oxygen therapy     "24/7" (03/22/2014)  . Myocardial infarction ? date; 03/13/2014  . Type II diabetes mellitus     insulin requiring.   . Anemia   . History of blood transfusion     "twice" (03/22/2014)  . PEA (Pulseless electrical activity) 03/13/2014    [name] 03/13/2014    Medications: see med rec  Assessment: 73 y/o F presents with dyspnea, hypotension. She is an ESRD patient was was recently discharged s/p PEA arrest 03/23/14. Has large pleural effusion.  Patient has complex PMH including: hypothyroid, Cdiff, CAD, HTN, GERD, PVD, h/o DVT/PE, COPD, chronic SOB, lower limb ischemia, PNA, CHF, HLD, GIB, ESRD, DM, anemia, PEA 03/13/14, chronic pleural effusion.  ID:  Afebrile. WBC elevated 17.9. Treat empirically for HCAP with Aztreonam and Vanco.  Vanco 6/3>> Aztreonam 6/3>>  Pulm: Right Pleural effusion likely due to volume overload 2/2 CHF/CKD. Treat with HD. If not effective, consider thoracentesis. Bipap prn.   Goal of Therapy:  Vancomycin trough level 15-20 mcg/ml  Plan:  Aztreonam 1g IV q24h dose ok for HD patients. Vancomycin 1500mg  IV x 1 then 750mg  IV qHD MWF   Harshan Kearley Stillinger Adel Burch 04/05/2014,1:45 PM

## 2014-04-05 NOTE — Consult Note (Signed)
Name: [name] [name] MRN: [id number] DOB: 06-22-1941    ADMISSION DATE:  04/05/2014 CONSULTATION DATE:  04/05/2014  REFERRING MD :  EDP PRIMARY SERVICE:  TRH  CHIEF COMPLAINT:  Dyspnea  BRIEF PATIENT DESCRIPTION: 73 year old female ESRD on HD. Recent admission for PEA arrest, d/c 03/23/14. Presents today for hypotension during dialysis and SOB x weeks. Started on BiPAP in ED. Noted to have large pleural effusion. PCCM asked to consult for effusion management.   SIGNIFICANT EVENTS / STUDIES:  6/3 admitted  LINES / TUBES: L tunneled HD cath.   CULTURES: Blood 6/3 >>> Urine 6/3 >>>  ANTIBIOTICS: Vanc 6/3 >>> Zosyn 6/3 >>>  HISTORY OF PRESENT ILLNESS:   73 year old female with PMH as below, which includes ESRD on HD, CAD, PEA, CHF. Recent admission for PEA arrest. She was admitted to [facility] 5/11 and treated with the normal therapies for cardiac arrest including hypothermia protocol. d/c 03/23/14. Presents today for hypotension during dialysis and SOB x weeks. Started on BiPAP in ED. Noted to have large acute on chronic pleural effusion. PCCM asked to consult for effusion management.   PAST MEDICAL HISTORY :  Past Medical History  Diagnosis Date  . Hypothyroidism   . Colitis 10/2011    C diff.   . Coronary artery disease 3/13    3V CAD at cath, failed RCA PCI  . Hypertension   . GERD (gastroesophageal reflux disease)   . Peripheral vascular disease     claudication.   Marland Kitchen Hx of pulmonary embolus   . COPD (chronic obstructive pulmonary disease)     Nocturnal O2.   . Shortness of breath     uses O2 as needed  . Critical lower limb ischemia   . DVT (deep venous thrombosis) 2008    "right"  . Pneumonia     "several times"  . CHF (congestive heart failure)     Volume mgt with HD  . Hypercholesteremia   . GI bleed March 2015  . ESRD on hemodialysis     Started HD in December 2013.  Cause of ESRD unknown.  Gets HD in [place] on MWF schedule. She had a right upper arm access which  failed just short of one year.  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  She says she has "poor circulation" in the legs so they won't do a leg access. She remains catheter dependent as of   . On home oxygen therapy     "24/7" (03/22/2014)  . Myocardial infarction ? date; 03/13/2014  . Type II diabetes mellitus     insulin requiring.   . Anemia   . History of blood transfusion     "twice" (03/22/2014)  . PEA (Pulseless electrical activity) 03/13/2014    [name] 03/13/2014   Past Surgical History  Procedure Laterality Date  . Tubal ligation    . Toe amputation Right     5th toe  . Tonsillectomy    . Insertion of dialysis catheter Left     chest  . Av fistula placement Right [id number]    RUE  . Bascilic vein transposition  May 18, 2012    First stage  . Bascilic vein transposition Left 07/27/12    Basilic Vein Transposition  . Arch aortogram  08/19/2012    Procedure: ARCH AORTOGRAM;  Surgeon: Fransisco Hertz, MD;  Location[facility] OR;  Service:  Vascular;  Laterality: N/A;  . Insertion of dialysis catheter  08/21/2012    Procedure: INSERTION OF DIALYSIS CATHETER;  Surgeon: Sherren Kerns, MD;  Location: [facility] OR;  Service: Vascular;  Laterality: Left;  internal jugular  . Esophagogastroduodenoscopy N/A 01/06/2014    Procedure: ESOPHAGOGASTRODUODENOSCOPY (EGD);  Surgeon: Hilarie Fredrickson, MD;  Location: [facility] ENDOSCOPY;  Service: Endoscopy;  Laterality: N/A;  . Cardiac catheterization  02/04/2012    LAD 40/99%, CRX OK, OM1 90% (small), RCA 90%/subtotal, failed PCI. Good L-R collaterals.   Prior to Admission medications   Medication Sig Start Date End Date Taking? Authorizing Provider  albuterol (PROAIR HFA) 108 (90 BASE) MCG/ACT inhaler Inhale 2 puffs into the lungs every 4 (four) hours as needed for wheezing or shortness of breath.   Yes Historical Provider, MD  albuterol (PROVENTIL) (2.5 MG/3ML) 0.083% nebulizer solution Take  2.5 mg by nebulization every 6 (six) hours as needed for wheezing or shortness of breath.   Yes Historical Provider, MD  amLODipine (NORVASC) 5 MG tablet Take 1 tablet (5 mg total) by mouth daily. 03/04/14  Yes Jeralyn Bennett, MD  aspirin EC 81 MG tablet Take 81 mg by mouth daily.   Yes Historical Provider, MD  carvedilol (COREG) 3.125 MG tablet Take 3.125 mg by mouth 2 (two) times daily with a meal.   Yes Historical Provider, MD  cyclobenzaprine (FLEXERIL) 10 MG tablet Take 1 tablet (10 mg total) by mouth 3 (three) times daily as needed for muscle spasms. 03/01/14  Yes Esperanza Sheets, MD  insulin glargine (LANTUS) 100 UNIT/ML injection Inject 0.1 mLs (10 Units total) into the skin at bedtime. 03/01/14  Yes Esperanza Sheets, MD  isosorbide mononitrate (IMDUR) 30 MG 24 hr tablet Take 15 mg by mouth daily.   Yes Historical Provider, MD  levothyroxine (SYNTHROID, LEVOTHROID) 25 MCG tablet Take 25 mcg by mouth daily.    Yes Historical Provider, MD  midodrine (PROAMATINE) 5 MG tablet Take 5 mg by mouth See admin instructions. Take twice daily on Sunday, Tuesday, Thursday and Saturday   Yes Historical Provider, MD  multivitamin (RENA-VIT) TABS tablet Take 1 tablet by mouth daily.   Yes Historical Provider, MD  simvastatin (ZOCOR) 80 MG tablet Take 1 tablet (80 mg total) by mouth every evening. 01/08/14  Yes Hollice Espy, MD  tiotropium (SPIRIVA) 18 MCG inhalation capsule Place 1 capsule (18 mcg total) into inhaler and inhale daily. 11/13/13  Yes Rhetta Mura, MD  budesonide (PULMICORT) 0.25 MG/2ML nebulizer solution Take 2 mLs (0.25 mg total) by nebulization 2 (two) times daily. 03/23/14   Ripudeep Jenna Luo, MD  calcium acetate (PHOSLO) 667 MG capsule Take 667 mg by mouth 3 (three) times daily with meals.    Historical Provider, MD  insulin aspart (NOVOLOG) 100 UNIT/ML injection Inject 2 Units into the skin 3 (three) times daily with meals. 12/30/13   Alison Murray, MD  mupirocin cream (BACTROBAN) 2 %  Apply topically daily. To left heel 03/23/14   Ripudeep Jenna Luo, MD  nitroGLYCERIN (NITROSTAT) 0.4 MG SL tablet Place 0.4 mg under the tongue every 5 (five) minutes as needed for chest pain. For chest pain.    Historical Provider, MD  Nutritional Supplements (FEEDING SUPPLEMENT, NEPRO CARB STEADY,) LIQD Take 237 mLs by mouth 2 (two) times daily between meals. 03/23/14   Ripudeep Jenna Luo, MD  pantoprazole (PROTONIX) 40 MG tablet Take 40 mg by mouth daily.     Historical Provider, MD  Probiotic Product (PROBIOTIC  PO) Take 1 capsule by mouth daily as needed (digestion).     Historical Provider, MD   Allergies  Allergen Reactions  . Procaine Hcl Nausea And Vomiting and Other (See Comments)    [facility] has reaction of "comatose"    FAMILY HISTORY:  Family History  Problem Relation Age of Onset  . Diabetes type II    . Hypertension    . Diabetes Mother   . Kidney disease Mother   . Diabetes Father   . Anesthesia problems Neg Hx    SOCIAL HISTORY:  reports that she quit smoking about 2 years ago. Her smoking use included Cigarettes. She has a 40 pack-year smoking history. She has never used smokeless tobacco. She reports that she does not drink alcohol or use illicit drugs.  REVIEW OF SYSTEMS:   Bolds are positive  Constitutional: weight loss, gain, night sweats, Fevers, chills, fatigue .  HEENT: headaches, Sore throat, sneezing, nasal congestion, post nasal drip, Difficulty swallowing, Tooth/dental problems, visual complaints visual changes, ear ache CV:  chest pain, radiates: ,Orthopnea, PND, swelling in lower extremities, dizziness, palpitations, syncope.  GI  heartburn, indigestion, abdominal pain, nausea, vomiting, diarrhea, change in bowel habits, loss of appetite, bloody stools.  Resp: cough, productive: , hemoptysis, dyspnea, chest pain, pleuritic.  Skin: rash or itching or icterus GU: dysuria, change in color of urine, urgency or frequency. flank pain, hematuria  MS:  joint pain or swelling. decreased range of motion  Psych: change in mood or affect. depression or anxiety.  Neuro: difficulty with speech, weakness, numbness, ataxia    SUBJECTIVE:   VITAL SIGNS: Temp:  [98.7 F (37.1 C)] 98.7 F (37.1 C) (06/03 0936) Pulse Rate:  [66-117] 101 (06/03 1213) Resp:  [18-22] 18 (06/03 1213) BP: (97-150)/(52-76) 130/52 mmHg (06/03 1213) SpO2:  [83 %-100 %] 93 % (06/03 1213) FiO2 (%):  [30 %] 30 % (06/03 1052)  PHYSICAL EXAMINATION: General:  Chronically ill appearing female on BiPAP Neuro:  Alert, oriented HEENT:  /AT, PERRL Neck:  No JVD noted Cardiovascular:  Tachy, regular, no peripheral edema.  Lungs:  Bibasilar rhonchi R>L Abdomen:  Soft, nontender Musculoskeletal:  No acute deformity,  Skin:  ulcer to L foot, otherwise intact. Several ecchymotic areas.   Recent Labs Lab 04/05/14 1100  NA 137  K 4.5  CL 97  CO2 30  BUN 17  CREATININE 1.54*  GLUCOSE 148*    Recent Labs Lab 04/05/14 1100  HGB 10.8*  HCT 33.7*  WBC 17.9*  PLT 275   Dg Chest Port 1 View  04/05/2014   CLINICAL DATA:  Shortness of breath, dialysis  EXAM: PORTABLE CHEST - 1 VIEW  COMPARISON:  03/23/2014  FINDINGS: Moderate layering left pleural effusion, increased. No frank interstitial edema. No pneumothorax.  Cardiomegaly.  Left IJ dual lumen dialysis catheter terminating in the upper right atrium.  IMPRESSION: Moderate layering left pleural effusion, increased.   Electronically Signed   By: Charline Bills M.D.   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 Given the degree of her heart and renal disease it is appropriate to address the effusion by way of HD. If no resolution of respiratory symptoms, or unable to tolerate HD please re-consult and will reevaluate to see if  thoracentesis is warranted at that time.   Continue  BiPAP PRN to maintain SpO2 88-92% Solumedrol 60mg  TID, taper as able.  Scheduled BD's Renal following  PCCM will sign off.   Joneen RoachPaul Hoffman, ACNP [place] [facility] Pager [phone] or [phone]  Patient was discharged on midodrine the last admission, may consider adding that as well.  There is not family bedside and patient is too confused to make decision right now but would strongly recommend palliative care consultation as the patient is simply not able to tolerate dialysis and is here quite frequently after dialysis due to confusion and hypotension.  With regards to the pleuarl effusion this chronic and no indication for a thora at this time.  PCCM will sign off, please call back if needed.  Patient seen and examined, agree with above note.  I dictated the care and orders written for this patient under my direction.  Alyson ReedyWesam G Yacoub, MD [phone]

## 2014-04-05 NOTE — ED Notes (Signed)
Pt also states SOB for several weeks. Rhonchi heard all lung fields. Pt states she gets short of breath with exertion.

## 2014-04-05 NOTE — ED Notes (Addendum)
Pt is alert and oriented x4. Denies pain at the time. Remains on cardiac monitor. Vital signs stable at the time.

## 2014-04-05 NOTE — ED Notes (Signed)
  CBG 131  

## 2014-04-05 NOTE — H&P (Signed)
Patient Demographics  [name] [name], is a 73 y.o. female  MRN: [id number]   DOB - 1941/07/01  Admit Date - 04/05/2014  Outpatient Primary MD for the patient is Quentin Mulling, MD   With History of -  Past Medical History  Diagnosis Date  . Hypothyroidism   . Colitis 10/2011    C diff.   . Coronary artery disease 3/13    3V CAD at cath, failed RCA PCI  . Hypertension   . GERD (gastroesophageal reflux disease)   . Peripheral vascular disease     claudication.   Marland Kitchen Hx of pulmonary embolus   . COPD (chronic obstructive pulmonary disease)     Nocturnal O2.   . Shortness of breath     uses O2 as needed  . Critical lower limb ischemia   . DVT (deep venous thrombosis) 2008    "right"  . Pneumonia     "several times"  . CHF (congestive heart failure)     Volume mgt with HD  . Hypercholesteremia   . GI bleed March 2015  . ESRD on hemodialysis     Started HD in December 2013.  Cause of ESRD unknown.  Gets HD in [place] on MWF schedule. She had a right upper arm access which failed just short of one year.  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  She says she has "poor circulation" in the legs so they won't do a leg access. She remains catheter dependent as of   . On home oxygen therapy     "24/7" (03/22/2014)  . Myocardial infarction ? date; 03/13/2014  . Type II diabetes mellitus     insulin requiring.   . Anemia   . History of blood transfusion     "twice" (03/22/2014)  . PEA (Pulseless electrical activity) 03/13/2014    [name] 03/13/2014      Past Surgical History  Procedure Laterality Date  . Tubal ligation    . Toe amputation Right     5th toe  . Tonsillectomy    . Insertion of dialysis catheter Left     chest  . Av fistula  placement Right [id number]    RUE  . Bascilic vein transposition  May 18, 2012    First stage  . Bascilic vein transposition Left 07/27/12    Basilic Vein Transposition  . Arch aortogram  08/19/2012    Procedure: ARCH AORTOGRAM;  Surgeon: Fransisco Hertz, MD;  Location: [facility] OR;  Service: Vascular;  Laterality: N/A;  . Insertion of dialysis catheter  08/21/2012    Procedure: INSERTION OF DIALYSIS CATHETER;  Surgeon: Sherren Kerns, MD;  Location: [facility] OR;  Service: Vascular;  Laterality: Left;  internal jugular  . Esophagogastroduodenoscopy N/A 01/06/2014    Procedure: ESOPHAGOGASTRODUODENOSCOPY (EGD);  Surgeon: Hilarie Fredrickson, MD;  Location: [facility] ENDOSCOPY;  Service: Endoscopy;  Laterality: N/A;  . Cardiac catheterization  02/04/2012    LAD 40/99%, CRX OK, OM1 90% (small), RCA 90%/subtotal, failed PCI. Good L-R collaterals.    in for   Chief Complaint  Patient presents with  . Hypotension     HPI  [name] [name]  is a 73 y.o. female,  with history of ESRD on Monday Wednesday Friday dialysis, chronic diastolic heart failure last known EF 50% one month ago, hypothyroidism, CAD, history of DVT and PE, anemia of chronic disease, COPD on 2 L oxygen at nighttime and as needed and daytime, PAD, who was recently admitted 10 days ago after he went of PEA which happened during her dialysis, patient at that underwent CPR and was intubated for 4 days, she was seen by electrophysiology M.D. Dr. Johney Frame saw the patient and recommended that patient's Coreg be discontinued and advised against a pacemaker at that time, he should also had mild pulmonary edema and left lower lobe pleural effusion during her last visit. Apparently patient has had a cardiac arrest associated with dialysis once previously as well i.e. total of 2 times.   Today patient went for dialysis again she's been having issues with low blood pressure during her dialysis treatments, today 1 hour into the dialysis treatment her blood pressure dropped to mid  70s, she received some normal saline in the dialysis unit her dialysis was stopped, she was then sent to the ER via EMS, during transportation patient was noted to be hypoxic, when she arrived in the ER she was placed initially on a nonrebreather thereafter her ABGs showed acute hypercapnic respiratory failure and she was placed on BiPAP. She was also noted to have a leukocytosis, mildly worsened left sided pleural effusion, I was called to admit the patient.    Review of Systems    In addition to the HPI above,   No Fever-chills, No Headache, No changes with Vision or hearing, No problems swallowing food or Liquids, No Chest pain, ++ Cough & Shortness of Breath, No Abdominal pain, No Nausea or Vommitting, Bowel movements are regular, No Blood in stool or Urine, No dysuria, No new skin rashes or bruises, No new joints pains-aches,  No new weakness, tingling, numbness in any extremity, No recent weight gain or loss, No polyuria, polydypsia or polyphagia, No significant Mental Stressors.  A full 10 point Review of Systems was done, except as stated above, all other Review of Systems were negative.   Social History History  Substance Use Topics  . Smoking status: Former Smoker -- 1.00 packs/day for 40 years    Types: Cigarettes    Quit date: 06/11/2011  . Smokeless tobacco: Never Used  . Alcohol Use: No      Family History Family History  Problem Relation Age of Onset  . Diabetes type II    . Hypertension    . Diabetes Mother   . Kidney disease Mother   . Diabetes Father   . Anesthesia problems Neg Hx       Prior to Admission medications   Medication Sig Start Date End Date Taking? Authorizing Provider  albuterol (PROAIR HFA) 108 (90 BASE) MCG/ACT inhaler Inhale 2 puffs into the lungs every 4 (four) hours as needed for wheezing or shortness of breath.   Yes Historical Provider, MD  albuterol (PROVENTIL) (2.5 MG/3ML) 0.083% nebulizer solution Take 2.5 mg by nebulization  every 6 (six) hours as needed for wheezing or shortness of breath.   Yes Historical Provider, MD  amLODipine (NORVASC)  5 MG tablet Take 1 tablet (5 mg total) by mouth daily. 03/04/14  Yes Jeralyn Bennett, MD  aspirin EC 81 MG tablet Take 81 mg by mouth daily.   Yes Historical Provider, MD  carvedilol (COREG) 3.125 MG tablet Take 3.125 mg by mouth 2 (two) times daily with a meal.   Yes Historical Provider, MD  cyclobenzaprine (FLEXERIL) 10 MG tablet Take 1 tablet (10 mg total) by mouth 3 (three) times daily as needed for muscle spasms. 03/01/14  Yes Esperanza Sheets, MD  insulin glargine (LANTUS) 100 UNIT/ML injection Inject 0.1 mLs (10 Units total) into the skin at bedtime. 03/01/14  Yes Esperanza Sheets, MD  isosorbide mononitrate (IMDUR) 30 MG 24 hr tablet Take 15 mg by mouth daily.   Yes Historical Provider, MD  levothyroxine (SYNTHROID, LEVOTHROID) 25 MCG tablet Take 25 mcg by mouth daily.    Yes Historical Provider, MD  midodrine (PROAMATINE) 5 MG tablet Take 5 mg by mouth See admin instructions. Take twice daily on Sunday, Tuesday, Thursday and Saturday   Yes Historical Provider, MD  multivitamin (RENA-VIT) TABS tablet Take 1 tablet by mouth daily.   Yes Historical Provider, MD  simvastatin (ZOCOR) 80 MG tablet Take 1 tablet (80 mg total) by mouth every evening. 01/08/14  Yes Hollice Espy, MD  tiotropium (SPIRIVA) 18 MCG inhalation capsule Place 1 capsule (18 mcg total) into inhaler and inhale daily. 11/13/13  Yes Rhetta Mura, MD  budesonide (PULMICORT) 0.25 MG/2ML nebulizer solution Take 2 mLs (0.25 mg total) by nebulization 2 (two) times daily. 03/23/14   Ripudeep Jenna Luo, MD  calcium acetate (PHOSLO) 667 MG capsule Take 667 mg by mouth 3 (three) times daily with meals.    Historical Provider, MD  insulin aspart (NOVOLOG) 100 UNIT/ML injection Inject 2 Units into the skin 3 (three) times daily with meals. 12/30/13   Alison Murray, MD  mupirocin cream (BACTROBAN) 2 % Apply topically daily. To  left heel 03/23/14   Ripudeep Jenna Luo, MD  nitroGLYCERIN (NITROSTAT) 0.4 MG SL tablet Place 0.4 mg under the tongue every 5 (five) minutes as needed for chest pain. For chest pain.    Historical Provider, MD  Nutritional Supplements (FEEDING SUPPLEMENT, NEPRO CARB STEADY,) LIQD Take 237 mLs by mouth 2 (two) times daily between meals. 03/23/14   Ripudeep Jenna Luo, MD  pantoprazole (PROTONIX) 40 MG tablet Take 40 mg by mouth daily.     Historical Provider, MD  Probiotic Product (PROBIOTIC PO) Take 1 capsule by mouth daily as needed (digestion).     Historical Provider, MD    Allergies  Allergen Reactions  . Procaine Hcl Nausea And Vomiting and Other (See Comments)    [facility] has reaction of "comatose"    Physical Exam  Vitals  Blood pressure 151/62, pulse 103, temperature 98.7 F (37.1 C), temperature source Rectal, resp. rate 20, height 5' 4.96" (1.65 m), SpO2 98.00%.   1. General elderly white female lying in bed in NAD wearing BIPAP  2. Normal affect and insight, Not Suicidal or Homicidal, Awake Alert, Oriented X 3.  3. No F.N deficits, ALL C.Nerves Intact, Strength 5/5 all 4 extremities, Sensation intact all 4 extremities, Plantars down going.  4. Ears and Eyes appear Normal, Conjunctivae clear, PERRLA. Moist Oral Mucosa.  5. Supple Neck, No JVD, No cervical lymphadenopathy appriciated, No Carotid Bruits.  6. Symmetrical Chest wall movement, Good air movement bilaterally, few rales + wheezing  7. RRR, No Gallops, Rubs or Murmurs, No  Parasternal Heave.  8. Positive Bowel Sounds, Abdomen Soft, No tenderness, No organomegaly appriciated,No rebound -guarding or rigidity.  9.  No Cyanosis, Normal Skin Turgor, No Skin Rash or Bruise.  10. Good muscle tone,  joints appear normal , no effusions, Normal ROM.  11. No Palpable Lymph Nodes in Neck or Axillae     Data Review  CBC  Recent Labs Lab 04/05/14 1100  WBC 17.9*  HGB 10.8*  HCT 33.7*  PLT 275  MCV 93.4   MCH 29.9  MCHC 32.0  RDW 16.0*  LYMPHSABS 1.5  MONOABS 0.7  EOSABS 0.1  BASOSABS 0.1   ------------------------------------------------------------------------------------------------------------------  Chemistries   Recent Labs Lab 04/05/14 1100  NA 137  K 4.5  CL 97  CO2 30  GLUCOSE 148*  BUN 17  CREATININE 1.54*  CALCIUM 8.9  AST 57*  ALT 40*  ALKPHOS 280*  BILITOT 0.3   ------------------------------------------------------------------------------------------------------------------ CrCl is unknown because both a height and weight (above a minimum accepted value) are required for this calculation. ------------------------------------------------------------------------------------------------------------------ No results found for this basename: TSH, T4TOTAL, FREET3, T3FREE, THYROIDAB,  in the last 72 hours   Coagulation profile No results found for this basename: INR, PROTIME,  in the last 168 hours ------------------------------------------------------------------------------------------------------------------- No results found for this basename: DDIMER,  in the last 72 hours -------------------------------------------------------------------------------------------------------------------  Cardiac Enzymes No results found for this basename: CK, CKMB, TROPONINI, MYOGLOBIN,  in the last 168 hours ------------------------------------------------------------------------------------------------------------------ No components found with this basename: POCBNP,    ---------------------------------------------------------------------------------------------------------------  Urinalysis    Component Value Date/Time   COLORURINE YELLOW 03/13/2014 1258   APPEARANCEUR TURBID* 03/13/2014 1258   LABSPEC 1.019 03/13/2014 1258   PHURINE 5.0 03/13/2014 1258   GLUCOSEU NEGATIVE 03/13/2014 1258   HGBUR LARGE* 03/13/2014 1258   BILIRUBINUR SMALL* 03/13/2014 1258   KETONESUR  15* 03/13/2014 1258   PROTEINUR 100* 03/13/2014 1258   UROBILINOGEN 1.0 03/13/2014 1258   NITRITE NEGATIVE 03/13/2014 1258   LEUKOCYTESUR LARGE* 03/13/2014 1258    ----------------------------------------------------------------------------------------------------------------  Imaging results:    Dg Chest Port 1 View  04/05/2014   CLINICAL DATA:  Shortness of breath, dialysis  EXAM: PORTABLE CHEST - 1 VIEW  COMPARISON:  03/23/2014  FINDINGS: Moderate layering left pleural effusion, increased. No frank interstitial edema. No pneumothorax.  Cardiomegaly.  Left IJ dual lumen dialysis catheter terminating in the upper right atrium.  IMPRESSION: Moderate layering left pleural effusion, increased.   Electronically Signed   By: Charline Bills M.D.   On: 04/05/2014 09:57      My personal review of EKG: Rhythm NSR,   no Acute ST changes    Assessment & Plan    1. Acute hypercapnic respiratory failure - etiology is unclear however this appears to be multi-factorial secondary to was able HCAP, mild COPD exacerbation and certainly an element of pleural effusion with mild fluid overload as well.    A. Right-sided pleural effusion and mild pulmonary edema due to fluid overload.   She has been seen by pulmonary, they have advised again distant thoracentesis for fluid removal, however the problem at this time appears to be that patient is unable to tolerate her dialysis treatments due to persistent hypotension and now 2 events of cardiac arrest, I have consulted renal to commence dialysis however if persistent hypotension continues despite midodrine she might be a candidate for comfort care. Previously she was DO NOT RESUSCITATE for a few days but then patient chose to become full code and full care again.  B.HCAP with mild COPD exacerbation. Sputum and blood cultures, antibiotics as appropriate for HCAP, low dose IV Solu-Medrol and monitor. Oxygen and nebulizer treatments as needed, when necessary  BiPAP. Monitor in stepped-down. If stable antibiotics could be rapidly tapered along with steroids.      2. 2 episodes of cardiac arrest and persistent hypotension in the setting of hemodialysis. We'll discontinue her Coreg, midodrine will be continued, have discussed the plan with nephrologist Dr.Schertz will initiate the conversation with family and patient about DO NOT RESUSCITATE and stopping future dialysis treatments.     3. ESRD. On Monday Wednesday Friday dialysis, dialysis was stopped after 1 hour today, renal onboard.     4. Chronic diastolic CHF EF is now 50% on recent echo gram less than one month ago. Mild fluid overload due to unfinished dialysis causing acute on chronic diastolic CHF, fluid removal via dialysis. Coreg will be held due to recent cardiac arrest and as suggested by EP at that time.    5.DM2 - on Lantus which will be continued, will continue pre-meal NovoLog scheduled along with sliding scale. Monitor sugars closely as she is on low-dose IV Solu-Medrol.    6. Dyslipidemia - continue home dose statin.     7. CAD/PAD. Patient on statin, aspirin, Imdur for secondary prevention which will be continued. Coreg stopped as suggested by cardiology last admission.     8. Hypothyroidism. On Synthroid which will be continued.     9. History of hypertension. For now we'll place her on as needed IV hydralazine, will hold her Norvasc due to persistent issues with hypotension during dialysis treatments the     10.GERD - continue PPI     11. History of DVT PE. No acute issues, not on anticoagulation, heparin for prophylaxis. Last admission had CT angiogram of the chest which was negative for PE.      DVT Prophylaxis Heparin    AM Labs Ordered, also please review Full Orders  Family Communication: Admission, patients condition and plan of care including tests being ordered have been discussed with the patient  who indicates understanding and agree  with the plan and Code Status.  Code Status Full  Likely DC to  TBD  Condition GUARDED     Time spent in minutes : 45    Leroy SeaPrashant K Dorrie Cocuzza M.D on 04/05/2014 at 1:44 PM  Between 7am to 7pm - Pager - [phone]  After 7pm go to [email or web address] - password [email or web address]  And look for the night coverage person covering me after hours  [facility] Office  [phone]   **Disclaimer: This note may have been dictated with voice recognition software. Similar sounding words can inadvertently be transcribed and this note may contain transcription errors which may not have been corrected upon publication of note.**

## 2014-04-05 NOTE — Consult Note (Addendum)
Renal Service Consult Note [facility]  [name] [name] 04/05/2014 [name] [name] Requesting Physician:  Dr Thedore Mins  Reason for Consult:  ESRD pt with hypotensive episode at HD HPI: The patient is a 73 y.o. year-old WF w multiple medical problems who had problems with low BP today at OP HD.  About 45 min into HD her BP dropped into the 60-70's range , she was given a total of 600 cc NS without improvement over about 30 minutes and EMS was called at pt rinsed back.  BP improved to 130 with rinseback.  She was not alert with the low BP's. She was brought to ED where SaO2 was borderline, pt not in distress. ABG showed CO2 retention with pCO2 in 70's and she was placed on Bipap. She is comfortable now.    Chart review: Sept 08-- R 5th toe amp for osteomyelitis r/t diab foot ulcer, severe PVD, HTN, DM, low T4. Had aortogram which showed occluded R renal art, diffusely diseased abd aorta, high grade dz R CIA, diffuse dz R ext iliac, diffuse dz R SFA, high L CI and EI stenoses, occluded L SFA.  Dec 12-- to ED with SOB, Cr 4.97, as [place] 2 wks prior for PNA. Resp failure rx w Bipap, SOB due to pulm edema, ?PE and known COPD. 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  ECHO w EF 55%.  Rx'd for COPD flare, ESBL UTI, acute R leg DVT and Cdif colitis. DC'd to SNF.  Mar 13-- ESBL UTI, anemia HTN, acute on chronic resp failure, HCAP, ESRD on HD, hx of PE on coumadin Apr 13-- NSTEMI, acute on chronic resp failure, HCAP, bilat pleural effusions. Card did myoview which showed prior infarcts and inferior ischemia. Did heart cath which showed chronically occluded RCA which was angioplastied, not amenable to stenting. Medical Rx also.  Apr 13-- presented for rhonchi at dialysis, and CXR showing PNA in ED.  NO sob.  CXR changes questionable per primary MD. Rx'd w 5 d course of IV then po abx.  COPD on home O2, ESRD, DM, dvt/pe on coumadin, and HTN Jun 13-- ligation of R BVT for steal syndrome Jul 13-- left 1st stage  BVT Sept 13-- left 2nd stage BVT Oct 13-- pt presented with N/V/D, fevers and grew CNSS in blood cx's due to infected R IJ HD cath which was removed along with ligation of L arm AVF for severe steal syndrome.  New L IJ cath placed, abx given May 14-- SP lower abd pain, L flank pain, hematuria > admitted and US showed L hydro and atrophic R kidney. Stone seen in bladder by abd CT and urology felt she had passed a stone, no further eval needed.+UTI rx'd with po cipro. ESRD on HD MWF.  Dec 14-- presented with SOB, cough, hypoxemia x 2 weeks, O2 sat in 70's, bilat opacity on cxr. Rx'd with bipap support, steroids, nebs, dialysis with improvement, home on 3L [place] O2. Jan 15-- presented with SOB and A/C resp failure w pulm edema on CXR and possible PNA; rx'd with bipap, HD , abx. BNP 22K, EF 30-35%. DC'd home Feb 15-- severe leg pain on HD, presented to [facility] ED where SaO2 was only 78% on 2L O2 per [place].  Admitted at [facility], not felt to have PNA so abx were stopped. Mild vol overload rx'd with HD.  +pleural effusion. Back and leg pain resolved, possibly related to cramping on HD. DM2, ESRD, L heel wound Feb 15-- presented with SOB  and A/C resp failrue with hypoxemia.  +effusions on CXR, rx'd with nebs, O2 support, HD.  Improved Mar 15-- pt felt weak at HD, anemia w Hb checked at 6 > sent to [facility].  BP low in outside ED, given IVF's and prbc's.  Tx'd to [facility].  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  Rec'd total 2u prbc's.  DC'd home. Hypotension now a chronic issue.  Midodrine increased for this. Peak troponin was 7.41.  Ranexa not started since pt was angina free. Cont statin and asa.   April 15-- cardiac arrest at dialysis in [place].  Rec'd about 5-10 min of CPR per EMS/ED note, was stable on arrival to ED.  Became unresponsive in ED with hypotension and bradycardia; improved rapidly after BVM and IVF's.  Admitted by CCM, felt to have A/C resp failure due to pulm edema +  severe COPD.  Rx'd with HD and vol removal, O2, nebs, etc.  Brady cardia resolved. May 2015-- shortly after about HD, pt became unresponsive at home. EMS recorded initial BP of 60's. Brought to ED where BP stabilized.  Admitted and did not have further episodes.  Cardiology recommended a 30-day event monitor. Not a candidate for invasive EP procedures.  BP's high late during admit and coreg stopped and norvasc started.  May 2015-- pt was at dialysis in [facility] getting ready to start treatment and had a PEA cardiac arrest. Treated with CPR, and EP with ROSC.  Taken to [facility] and intubated and transferred to [facility].  ECHo showed EF 50%, CT angio neg for PE, CXR pleural effusion. In hosp for 10 days and improved with d/c to SNF in [place] on 5/21.     ROS  no CP  no confusion   no jt pain   no back pain or HA  Past Medical History  Past Medical History  Diagnosis Date  . Hypothyroidism   . Colitis 10/2011    C diff.   . Coronary artery disease 3/13    3V CAD at cath, failed RCA PCI  . Hypertension   . GERD (gastroesophageal reflux disease)   . Peripheral vascular disease     claudication.   Marland Kitchen Hx of pulmonary embolus   . COPD (chronic obstructive pulmonary disease)     Nocturnal O2.   . Shortness of breath     uses O2 as needed  . Critical lower limb ischemia   . DVT (deep venous thrombosis) 2008    "right"  . Pneumonia     "several times"  . CHF (congestive heart failure)     Volume mgt with HD  . Hypercholesteremia   . GI bleed March 2015  . ESRD on hemodialysis     Started HD in December 2013.  Cause of ESRD unknown.  Gets HD in [place] on MWF schedule. She had a right upper arm access which failed just short of one year.  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  She says she has "poor circulation" in the legs so they won't do a leg access. She remains catheter dependent as of    . On home oxygen therapy     "24/7" (03/22/2014)  . Myocardial infarction ? date; 03/13/2014  . Type II diabetes mellitus     insulin requiring.   . Anemia   . History of blood  transfusion     "twice" (03/22/2014)  . PEA (Pulseless electrical activity) 03/13/2014    [name] 03/13/2014   Past Surgical History  Past Surgical History  Procedure Laterality Date  . Tubal ligation    . Toe amputation Right     5th toe  . Tonsillectomy    . Insertion of dialysis catheter Left     chest  . Av fistula placement Right [id number]    RUE  . Bascilic vein transposition  May 18, 2012    First stage  . Bascilic vein transposition Left 07/27/12    Basilic Vein Transposition  . Arch aortogram  08/19/2012    Procedure: ARCH AORTOGRAM;  Surgeon: Fransisco Hertz, MD;  Location: [facility] OR;  Service: Vascular;  Laterality: N/A;  . Insertion of dialysis catheter  08/21/2012    Procedure: INSERTION OF DIALYSIS CATHETER;  Surgeon: Sherren Kerns, MD;  Location: [facility] OR;  Service: Vascular;  Laterality: Left;  internal jugular  . Esophagogastroduodenoscopy N/A 01/06/2014    Procedure: ESOPHAGOGASTRODUODENOSCOPY (EGD);  Surgeon: Hilarie Fredrickson, MD;  Location: [facility] ENDOSCOPY;  Service: Endoscopy;  Laterality: N/A;  . Cardiac catheterization  02/04/2012    LAD 40/99%, CRX OK, OM1 90% (small), RCA 90%/subtotal, failed PCI. Good L-R collaterals.   Family History  Family History  Problem Relation Age of Onset  . Diabetes type II    . Hypertension    . Diabetes Mother   . Kidney disease Mother   . Diabetes Father   . Anesthesia problems Neg Hx    Social History  reports that she quit smoking about 2 years ago. Her smoking use included Cigarettes. She has a 40 pack-year smoking history. She has never used smokeless tobacco. She reports that she does not drink alcohol or use illicit drugs. Allergies  Allergies  Allergen Reactions  . Procaine Hcl Nausea And Vomiting and Other (See Comments)    [facility] has  reaction of "comatose"   Home medications Prior to Admission medications   Medication Sig Start Date End Date Taking? Authorizing Provider  albuterol (PROAIR HFA) 108 (90 BASE) MCG/ACT inhaler Inhale 2 puffs into the lungs every 4 (four) hours as needed for wheezing or shortness of breath.   Yes Historical Provider, MD  albuterol (PROVENTIL) (2.5 MG/3ML) 0.083% nebulizer solution Take 2.5 mg by nebulization every 6 (six) hours as needed for wheezing or shortness of breath.   Yes Historical Provider, MD  amLODipine (NORVASC) 5 MG tablet Take 1 tablet (5 mg total) by mouth daily. 03/04/14  Yes Jeralyn Bennett, MD  aspirin EC 81 MG tablet Take 81 mg by mouth daily.   Yes Historical Provider, MD  carvedilol (COREG) 3.125 MG tablet Take 3.125 mg by mouth 2 (two) times daily with a meal.   Yes Historical Provider, MD  cyclobenzaprine (FLEXERIL) 10 MG tablet Take 1 tablet (10 mg total) by mouth 3 (three) times daily as needed for muscle spasms. 03/01/14  Yes Esperanza Sheets, MD  insulin glargine (LANTUS) 100 UNIT/ML injection Inject 0.1 mLs (10 Units total) into the skin at bedtime. 03/01/14  Yes Esperanza Sheets, MD  isosorbide mononitrate (IMDUR) 30 MG 24 hr tablet Take 15 mg by mouth daily.   Yes Historical Provider, MD  levothyroxine (SYNTHROID, LEVOTHROID) 25 MCG tablet Take 25 mcg by mouth daily.    Yes Historical Provider, MD  midodrine (PROAMATINE) 5 MG tablet Take 5 mg by mouth See admin instructions. Take twice daily on Sunday, Tuesday, Thursday and  Saturday   Yes Historical Provider, MD  multivitamin (RENA-VIT) TABS tablet Take 1 tablet by mouth daily.   Yes Historical Provider, MD  simvastatin (ZOCOR) 80 MG tablet Take 1 tablet (80 mg total) by mouth every evening. 01/08/14  Yes Hollice Espy, MD  tiotropium (SPIRIVA) 18 MCG inhalation capsule Place 1 capsule (18 mcg total) into inhaler and inhale daily. 11/13/13  Yes Rhetta Mura, MD  budesonide (PULMICORT) 0.25 MG/2ML nebulizer solution  Take 2 mLs (0.25 mg total) by nebulization 2 (two) times daily. 03/23/14   Ripudeep Jenna Luo, MD  calcium acetate (PHOSLO) 667 MG capsule Take 667 mg by mouth 3 (three) times daily with meals.    Historical Provider, MD  insulin aspart (NOVOLOG) 100 UNIT/ML injection Inject 2 Units into the skin 3 (three) times daily with meals. 12/30/13   Alison Murray, MD  mupirocin cream (BACTROBAN) 2 % Apply topically daily. To left heel 03/23/14   Ripudeep Jenna Luo, MD  nitroGLYCERIN (NITROSTAT) 0.4 MG SL tablet Place 0.4 mg under the tongue every 5 (five) minutes as needed for chest pain. For chest pain.    Historical Provider, MD  Nutritional Supplements (FEEDING SUPPLEMENT, NEPRO CARB STEADY,) LIQD Take 237 mLs by mouth 2 (two) times daily between meals. 03/23/14   Ripudeep Jenna Luo, MD  pantoprazole (PROTONIX) 40 MG tablet Take 40 mg by mouth daily.     Historical Provider, MD  Probiotic Product (PROBIOTIC PO) Take 1 capsule by mouth daily as needed (digestion).     Historical Provider, MD   Liver Function Tests  Recent Labs Lab 04/05/14 1100  AST 57*  ALT 40*  ALKPHOS 280*  BILITOT 0.3  PROT 6.8  ALBUMIN 3.1*   No results found for this basename: LIPASE, AMYLASE,  in the last 168 hours CBC  Recent Labs Lab 04/05/14 1100  WBC 17.9*  NEUTROABS 15.5*  HGB 10.8*  HCT 33.7*  MCV 93.4  PLT 275   Basic Metabolic Panel  Recent Labs Lab 04/05/14 1100  NA 137  K 4.5  CL 97  CO2 30  GLUCOSE 148*  BUN 17  CREATININE 1.54*  CALCIUM 8.9    Filed Vitals:   04/05/14 1115 04/05/14 1130 04/05/14 1145 04/05/14 1213  BP: 150/65 133/71 141/63 130/52  Pulse: 66 93 105 101  Temp:      TempSrc:      Resp: 22 22 18 18   SpO2: 96% 97% 83% 93%   Exam: Alert, on bipap, elderly WD not in acute distress No rash, cyanosis or gangrene Sclera anicteric, throat clear No JVD Chest dec'd L base, R clear RRR tachy no MRG Abd obese, NTND, no ascites No LE edema or UE edema Neuro mod gen weakness, nf, Ox3 L  IJ HD cath  HD: MWF [place] 4h   F160   62kg   2/2.5 [place]   Prof 4   Heparin none Hect 2  Aranesp 60 q week was due today 6/3     ECHO 03/15/14  LV 50% EF,  RV dysfunction probable (poor visualization)  Assessment: 1 Hypotension- recurrent issue with this patient, multifactorial w ESRD and prob 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 She does not have afib 11 Left pleural effusion    Plan- stop all BP lowering agents including coreg, norvasc and imdur (not sure she is taking all these).  Increase midodrine if needed.  No further HD today.  Get a standing weight when pt is stable. Patient is doing very poorly overall and is unrealistic about her prognosis.  Will try to talk with family about these matters.    Vinson Moselleob Skyeler Scalese MD (pgr) [phone]    ([phone] 04/05/2014, 1:25 PM

## 2014-04-05 NOTE — ED Notes (Signed)
Lab at bedside

## 2014-04-05 NOTE — Progress Notes (Signed)
Pt just got to floor from ED. Pt is alert and oriented x 4. Paged admitting MD per orders Dr. Thedore Mins and notified him of pt's arrival. Called central telemetry and notified of pt's arrival. Pt's heart monitor on and working.

## 2014-04-06 ENCOUNTER — Telehealth: Payer: Self-pay | Admitting: *Deleted

## 2014-04-06 ENCOUNTER — Encounter (HOSPITAL_COMMUNITY): Payer: Self-pay | Admitting: Nephrology

## 2014-04-06 DIAGNOSIS — N390 Urinary tract infection, site not specified: Secondary | ICD-10-CM

## 2014-04-06 DIAGNOSIS — I2589 Other forms of chronic ischemic heart disease: Secondary | ICD-10-CM

## 2014-04-06 DIAGNOSIS — G4733 Obstructive sleep apnea (adult) (pediatric): Secondary | ICD-10-CM

## 2014-04-06 DIAGNOSIS — Z9989 Dependence on other enabling machines and devices: Secondary | ICD-10-CM

## 2014-04-06 DIAGNOSIS — J189 Pneumonia, unspecified organism: Principal | ICD-10-CM

## 2014-04-06 DIAGNOSIS — N1330 Unspecified hydronephrosis: Secondary | ICD-10-CM

## 2014-04-06 DIAGNOSIS — J90 Pleural effusion, not elsewhere classified: Secondary | ICD-10-CM

## 2014-04-06 DIAGNOSIS — I469 Cardiac arrest, cause unspecified: Secondary | ICD-10-CM

## 2014-04-06 DIAGNOSIS — E039 Hypothyroidism, unspecified: Secondary | ICD-10-CM

## 2014-04-06 DIAGNOSIS — E662 Morbid (severe) obesity with alveolar hypoventilation: Secondary | ICD-10-CM

## 2014-04-06 LAB — BASIC METABOLIC PANEL
BUN: 29 mg/dL — ABNORMAL HIGH (ref 6–23)
CO2: 28 meq/L (ref 19–32)
Calcium: 9.2 mg/dL (ref 8.4–10.5)
Chloride: 95 mEq/L — ABNORMAL LOW (ref 96–112)
Creatinine, Ser: 2.07 mg/dL — ABNORMAL HIGH (ref 0.50–1.10)
GFR calc Af Amer: 27 mL/min — ABNORMAL LOW (ref >90)
GFR calc non Af Amer: 23 mL/min — ABNORMAL LOW (ref >90)
GLUCOSE: 278 mg/dL — AB (ref 70–99)
Potassium: 4.2 mEq/L (ref 3.7–5.3)
SODIUM: 135 meq/L — AB (ref 137–147)

## 2014-04-06 LAB — GLUCOSE, CAPILLARY
GLUCOSE-CAPILLARY: 277 mg/dL — AB (ref 70–99)
GLUCOSE-CAPILLARY: 260 mg/dL — AB (ref 70–99)
GLUCOSE-CAPILLARY: 319 mg/dL — AB (ref 70–99)
Glucose-Capillary: 145 mg/dL — ABNORMAL HIGH (ref 70–99)

## 2014-04-06 MED ORDER — LIDOCAINE HCL (PF) 1 % IJ SOLN: 5.0000 mL | INTRAMUSCULAR | Status: DC | PRN

## 2014-04-06 MED ORDER — NEPRO/CARBSTEADY PO LIQD
237.0000 mL | ORAL | Status: DC | PRN
  Filled 2014-04-06: qty 237

## 2014-04-06 MED ORDER — DM-GUAIFENESIN ER 30-600 MG PO TB12
1.0000 | ORAL_TABLET | Freq: Two times a day (BID) | ORAL | Status: DC
  Administered 2014-04-06 – 2014-04-09 (×6): 1 via ORAL
  Filled 2014-04-06 (×7): qty 1

## 2014-04-06 MED ORDER — MIDODRINE HCL 5 MG PO TABS
5.0000 mg | ORAL_TABLET | ORAL | Status: DC
  Filled 2014-04-06: qty 1

## 2014-04-06 MED ORDER — ALTEPLASE 2 MG IJ SOLR: 2.0000 mg | Freq: Once | INTRAMUSCULAR | Status: DC | PRN

## 2014-04-06 MED ORDER — HEPARIN SODIUM (PORCINE) 1000 UNIT/ML DIALYSIS: 1000.0000 [IU] | INTRAMUSCULAR | Status: DC | PRN

## 2014-04-06 MED ORDER — SODIUM CHLORIDE 0.9 % IV SOLN
100.0000 mL | INTRAVENOUS | Status: DC | PRN
Start: 2014-04-06 — End: 2014-04-06

## 2014-04-06 MED ORDER — NEPRO/CARBSTEADY PO LIQD: 237.0000 mL | ORAL | Status: DC | PRN

## 2014-04-06 MED ORDER — METHYLPREDNISOLONE SODIUM SUCC 125 MG IJ SOLR
60.0000 mg | INTRAMUSCULAR | Status: DC
  Administered 2014-04-07 – 2014-04-08 (×2): 60 mg via INTRAVENOUS
  Filled 2014-04-06 (×3): qty 0.96

## 2014-04-06 MED ORDER — ALTEPLASE 2 MG IJ SOLR
2.0000 mg | Freq: Once | INTRAMUSCULAR | Status: DC | PRN
  Filled 2014-04-06: qty 2

## 2014-04-06 MED ORDER — SODIUM CHLORIDE 0.9 % IV SOLN: 100.0000 mL | INTRAVENOUS | Status: DC | PRN

## 2014-04-06 MED ORDER — PENTAFLUOROPROP-TETRAFLUOROETH EX AERO: 1.0000 "application " | INHALATION_SPRAY | CUTANEOUS | Status: DC | PRN

## 2014-04-06 MED ORDER — MIDODRINE HCL 5 MG PO TABS
5.0000 mg | ORAL_TABLET | ORAL | Status: DC
  Filled 2014-04-06 (×2): qty 1

## 2014-04-06 MED ORDER — VANCOMYCIN HCL IN DEXTROSE 750-5 MG/150ML-% IV SOLN
750.0000 mg | INTRAVENOUS | Status: DC
  Filled 2014-04-06 (×2): qty 150

## 2014-04-06 MED ORDER — PENTAFLUOROPROP-TETRAFLUOROETH EX AERO
1.0000 | INHALATION_SPRAY | CUTANEOUS | Status: DC | PRN
Start: 2014-04-06 — End: 2014-04-06

## 2014-04-06 MED ORDER — LIDOCAINE-PRILOCAINE 2.5-2.5 % EX CREA: 1.0000 "application " | TOPICAL_CREAM | CUTANEOUS | Status: DC | PRN

## 2014-04-06 MED ORDER — LIDOCAINE-PRILOCAINE 2.5-2.5 % EX CREA
1.0000 | TOPICAL_CREAM | CUTANEOUS | Status: DC | PRN
Start: 2014-04-06 — End: 2014-04-06

## 2014-04-06 MED ORDER — INSULIN GLARGINE 100 UNIT/ML ~~LOC~~ SOLN
15.0000 [IU] | Freq: Every day | SUBCUTANEOUS | Status: DC
  Administered 2014-04-06: 15 [IU] via SUBCUTANEOUS
  Filled 2014-04-06 (×2): qty 0.15

## 2014-04-06 MED ORDER — COLLAGENASE 250 UNIT/GM EX OINT
TOPICAL_OINTMENT | Freq: Two times a day (BID) | CUTANEOUS | Status: DC
  Administered 2014-04-06 – 2014-04-08 (×5): via TOPICAL
  Administered 2014-04-08: 1 via TOPICAL
  Administered 2014-04-09: 10:00:00 via TOPICAL
  Filled 2014-04-06: qty 30

## 2014-04-06 NOTE — Progress Notes (Signed)
[facility] Progress Note   Subjective: off bipap, no complaints, sat's dropped into low 80's just now and NRB mask was reinstituted  Filed Vitals:   04/06/14 0048 04/06/14 0217 04/06/14 0427 04/06/14 0800  BP:   146/63 162/74  Pulse:   98 100  Temp:   98 F (36.7 C) 98 F (36.7 C)  TempSrc:   Oral Oral  Resp:   14 20  Height:      Weight:   66.7 kg (147 lb 0.8 oz)   SpO2: 90% 96% 94% 94%   Exam: Alert no distress No JVD  Chest dec'd L base, R clear  RRR tachy no MRG  Abd obese, NTND, no ascites  No LE edema or UE edema, 1.5x 1.5 cm L heel ulcer open Neuro mod gen weakness, nf, Ox3  L IJ HD cath   HD: MWF [place]  4h    F160    62kg    2/2.5 Bath   Prof 4    Heparin none  Hect 2     Aranesp 60 q week was due today 6/3  ECHO 03/15/14 LV 50% EF, RV dysfunction probable (poor visualization)   Assessment:  1 Hypotension- recurrent issue with this patient, multifactorial w ESRD and prob pulm HTN 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 , as well as medications 2 Acute on chronic resp failure- still hypoxemic this am , back on non-breather   3 ESRD on HD  4 CAD with RCA angioplasty this year  5 LV dysfunction w EF 30%  6 Obesity  7 COPD on home O2  8 Recent cardiac arrest x 2 (4/24 and 5/11) both occurred at HD  9 Hx PE/DVT in past  10 HTN- recurrent hypotension on or after HD so we are holding all BP meds and nitrates for now 11 Left pleural effusion  12 Left heel ulcer- WC evaluating   Plan- HD today and tomorrow to get volume down, permissive HTN for now, she probably needs thoracentesis as well    [name] MD  pager [phone]    cell [phone]  04/06/2014, 8:37 AM     Recent Labs Lab 04/05/14 1100 04/05/14 1417  NA 137  --   K 4.5  --   CL 97  --   CO2 30  --   GLUCOSE 148*  --   BUN 17  --   CREATININE 1.54* 1.64*  CALCIUM 8.9  --     Recent Labs Lab 04/05/14 1100  AST 57*  ALT 40*  ALKPHOS 280*  BILITOT 0.3  PROT 6.8   ALBUMIN 3.1*    Recent Labs Lab 04/05/14 1100 04/05/14 1417  WBC 17.9* 17.0*  NEUTROABS 15.5*  --   HGB 10.8* 12.0  HCT 33.7* 37.0  MCV 93.4 93.2  PLT 275 259   . albuterol  2.5 mg Nebulization Q6H  . aspirin EC  81 mg Oral Daily  . aztreonam  1 g Intravenous Q24H  . budesonide  0.25 mg Nebulization BID  . calcium acetate  667 mg Oral TID WC  . feeding supplement (NEPRO CARB STEADY)  237 mL Oral BID BM  . heparin  5,000 Units Subcutaneous 3 times per day  . insulin aspart  0-5 Units Subcutaneous QHS  . insulin aspart  0-9 Units Subcutaneous TID WC  . insulin aspart  5 Units Subcutaneous TID WC  . insulin glargine  10 Units Subcutaneous QHS  . isosorbide mononitrate  15 mg Oral  Daily  . levothyroxine  25 mcg Oral QAC breakfast  . methylPREDNISolone (SOLU-MEDROL) injection  60 mg Intravenous Q12H  . midodrine  5 mg Oral 2 times per day on Sun Tue Thu Sat  . multivitamin  1 tablet Oral Daily  . pantoprazole  40 mg Oral Daily  . simvastatin  80 mg Oral QPM  . tiotropium  18 mcg Inhalation Daily  . [START ON 04/07/2014] vancomycin  750 mg Intravenous Q M,W,F-HD     cyclobenzaprine, nitroGLYCERIN

## 2014-04-06 NOTE — Progress Notes (Signed)
Utilization review completed. Galen Malkowski, RN, BSN. 

## 2014-04-06 NOTE — Progress Notes (Signed)
ANTIBIOTIC CONSULT NOTE - FOLLOW UP Pharmacy Consult for Vancomycin Indication: pneumonia  Allergies  Allergen Reactions  . Procaine Hcl Nausea And Vomiting and Other (See Comments)    [facility] reaction of "comatose"    Patient Measurements: Height: 5\' 5"  (165.1 cm) Weight: 137 lb 2 oz (62.2 kg) IBW/kg (Calculated) : 57 Adjusted Body Weight:    Vital Signs: Temp: 97.4 F (36.3 C) (06/04 1406) Temp src: Oral (06/04 1406) BP: 133/61 mmHg (06/04 1406) Pulse Rate: 76 (06/04 1406) Intake/Output from previous day: 06/03 0701 - 06/04 0700 In: 220 [P.O.:220] Out: 475 [Urine:475] Intake/Output from this shift: Total I/O In: -  Out: 3070 [Urine:100; WUJWJ:1914Other:2969; Stool:1]  Labs:  Recent Labs  04/05/14 1100 04/05/14 1417 04/06/14 1127  WBC 17.9* 17.0*  --   HGB 10.8* 12.0  --   PLT 275 259  --   CREATININE 1.54* 1.64* 2.07*   Estimated Creatinine Clearance: 22.8 ml/min (by C-G formula based on Cr of 2.07). No results found for this basename: VANCOTROUGH, Leodis BinetVANCOPEAK, VANCORANDOM, GENTTROUGH, GENTPEAK, GENTRANDOM, TOBRATROUGH, TOBRAPEAK, TOBRARND, AMIKACINPEAK, AMIKACINTROU, AMIKACIN,  in the last 72 hours   Microbiology: Recent Results (from the past 720 hour(s))  MRSA PCR SCREENING     Status: None   Collection Time    03/13/14 12:58 PM      Result Value Ref Range Status   MRSA by PCR NEGATIVE  NEGATIVE Final   Comment:            The GeneXpert MRSA Assay (FDA     approved for NASAL specimens     only), is one component of a     comprehensive MRSA colonization     surveillance program. It is not     intended to diagnose MRSA     infection nor to guide or     monitor treatment for     MRSA infections.  URINE CULTURE     Status: None   Collection Time    03/13/14 12:58 PM      Result Value Ref Range Status   Specimen Description URINE, CATHETERIZED   Final   Special Requests NONE   Final   Culture  Setup Time     Final   Value: 03/13/2014  14:12     Performed at [facility]   Colony Count     Final   Value: >=100,000 COLONIES/ML     Performed at [facility]   Culture     Final   Value: CITROBACTER FREUNDII     Performed at [facility]   Report Status 03/16/2014 FINAL   Final   Organism ID, Bacteria CITROBACTER FREUNDII   Final  CULTURE, BLOOD (ROUTINE X 2)     Status: None   Collection Time    04/05/14 11:00 AM      Result Value Ref Range Status   Specimen Description BLOOD LEFT ANTECUBITAL   Final   Special Requests BOTTLES DRAWN AEROBIC ONLY 10CC   Final   Culture  Setup Time     Final   Value: 04/05/2014 17:19     Performed at [facility]   Culture     Final   Value:        BLOOD CULTURE RECEIVED NO GROWTH TO DATE CULTURE WILL BE HELD FOR 5 DAYS BEFORE ISSUING A FINAL NEGATIVE REPORT     Performed at [facility]   Report Status PENDING   Incomplete  CULTURE, BLOOD (ROUTINE  X 2)     Status: None   Collection Time    04/05/14 11:05 AM      Result Value Ref Range Status   Specimen Description BLOOD LEFT HAND   Final   Special Requests BOTTLES DRAWN AEROBIC ONLY 5CC   Final   Culture  Setup Time     Final   Value: 04/05/2014 17:19     Performed at [facility]   Culture     Final   Value:        BLOOD CULTURE RECEIVED NO GROWTH TO DATE CULTURE WILL BE HELD FOR 5 DAYS BEFORE ISSUING A FINAL NEGATIVE REPORT     Performed at [facility]   Report Status PENDING   Incomplete  URINE CULTURE     Status: None   Collection Time    04/05/14  1:54 PM      Result Value Ref Range Status   Specimen Description URINE, RANDOM   Final   Special Requests NONE   Final   Culture  Setup Time     Final   Value: 04/05/2014 18:56     Performed at [facility] Count     Final   Value: >=100,000 COLONIES/ML     Performed at [facility]   Culture     Final   Value: ESCHERICHIA COLI     Performed at [facility]   Report Status PENDING    Incomplete  MRSA PCR SCREENING     Status: None   Collection Time    04/05/14  4:01 PM      Result Value Ref Range Status   MRSA by PCR NEGATIVE  NEGATIVE Final   Comment:            The GeneXpert MRSA Assay (FDA     approved for NASAL specimens     only), is one component of a     comprehensive MRSA colonization     surveillance program. It is not     intended to diagnose MRSA     infection nor to guide or     monitor treatment for     MRSA infections.    Medical History: Past Medical History  Diagnosis Date  . Hypothyroidism   . Colitis 10/2011    C diff.   . Coronary artery disease 3/13    3V CAD at cath, failed RCA PCI  . Hypertension   . GERD (gastroesophageal reflux disease)   . Hx of pulmonary embolus   . COPD (chronic obstructive pulmonary disease)     Nocturnal O2.   . Shortness of breath     uses O2 as needed  . Critical lower limb ischemia   . DVT (deep venous thrombosis) 2008    "right"  . Pneumonia     "several times"  . CHF (congestive heart failure)     Volume mgt with HD  . Hypercholesteremia   . GI bleed March 2015  . ESRD on hemodialysis     Started HD in December 2012.  Cause of ESRD unknown.  Gets HD in [place] on MWF schedule. She had a right upper arm access which failed just short of one year.  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  She says she has "poor circulation" in the legs so they won't do a leg access. She  remains catheter dependent as of   . On home oxygen therapy     "3-4L; 24/7" (04/05/2014)  . Myocardial infarction ? date; 03/13/2014  . Anemia   . History of blood transfusion     "twice" (04/05/2014)  . PEA (Pulseless electrical activity) 03/13/2014    [name] 03/13/2014  . Peripheral vascular disease     claudication.   . Type II diabetes mellitus     insulin requiring.     Assessment: 73 y/o F admitted 04/05/2014  with dyspnea, hypotension. She is  an ESRD patient was was recently discharged s/p PEA arrest 03/23/14. Pharmacy consulted to dose vancomycin and azithromycin.    Patient has complex PMH including: hypothyroid, Cdiff, CAD, HTN, GERD, PVD, h/o DVT/PE, COPD, chronic SOB, lower limb ischemia, PNA, CHF, HLD, GIB, ESRD, DM, anemia, PEA 03/13/14, chronic pleural effusion.  Events 6/4 continues NRB  ID: empiric HCAP Afebrile. WBC elevated. Treat  Vanco 6/3>> Aztreonam 6/3>> 6/3 urine, > 100K ecoli 6/3 blood x 2 ngtd  Endo: DM, SSI, Lantus, glucose > 150  Pulm: Right Pleural effusion likely due to volume overload 2/2 CHF/CKD. Treating with HD. If not effective may consider thoracentesis. Bipap prn. MP 60 IV q12h, spiriva  Best Practice : SQ heparin, protonix   Goal of Therapy:  Vancomycin trough level 15-20 mcg/ml  Plan:  Aztreonam 1g IV q24h dose ok for HD patients. Vancomycin 750mg  IV after extra HD today Follow up HD schedule   Thank you for allowing pharmacy to be a part of this patients care team.  Lovenia Kim Pharm.D., BCPS, AQ-Cardiology Clinical Pharmacist 04/06/2014 4:22 PM Pager: [phone] Phone: [phone]

## 2014-04-06 NOTE — Procedures (Signed)
I was present at this dialysis session, have reviewed the session itself and made  appropriate changes  [name] MD (pgr) [phone]    ([phone] 04/06/2014, 11:39 AM

## 2014-04-06 NOTE — Telephone Encounter (Signed)
Received notification from [facility].  They have not been able to hook up the device that has been mailed to the patients home.  Several calls have been made.  This study will be cancelled and [facility] have been notified to retrieve the device.  Please call to let us know your intentions.

## 2014-04-06 NOTE — Progress Notes (Signed)
[place] TEAM 1 - Stepdown/ICU TEAM Progress Note  [name] SnipeJudy A [name] ONG:[id number] DOB: 06/26/1941 DOA: 04/05/2014 PCP: [name],[name], MD  Admit HPI / Brief Narrative: Karma GanjaJudy [name] is a 73 y.o. WF PMHx Hx GI bleed 01/2014, hypothyroidism, 3 vessel CAD, ESRD on HD M/W/F, Chronic diastolic heart failure last known EF 50% one month ago,cardiac arrest x 2 (4/24 and 5/11) both occurred at HD, hypothyroidism, CAD, history of DVT and PE, anemia of chronic disease, COPD on 2 L oxygen at nighttime and as needed and daytime, PAD, who was recently admitted 10 days ago after she went of PEA 03/13/2014 which happened during her dialysis, patient at that underwent CPR and was intubated for 4 days, she was seen by electrophysiology M.D. Dr. Johney FrameAllred saw the patient and recommended that patient's Coreg be discontinued and advised against a pacemaker at that time, she should also had mild pulmonary edema and left lower lobe pleural effusion during her last visit. Apparently patient has had a cardiac arrest associated with dialysis once previously as well i.e. total of 2 times.  Today patient went for dialysis again she's been having issues with low blood pressure during her dialysis treatments, today 1 hour into the dialysis treatment her blood pressure dropped to mid 70s, she received some normal saline in the dialysis unit her dialysis was stopped, she was then sent to the ER via EMS, during transportation patient was noted to be hypoxic, when she arrived in the ER she was placed initially on a nonrebreather thereafter her ABGs showed acute hypercapnic respiratory failure and she was placed on BiPAP. She was also noted to have a leukocytosis, mildly worsened left sided pleural effusion, I was called to admit the patient.   HPI/Subjective: 6/4 the obese female resting comfortably in bed  Assessment/Plan:  Acute hypercapnic respiratory failure  - etiology is unclear most likely multi-factorial secondary to HCAP, mild COPD  exacerbation, pleural effusion with mild fluid overload as well.   Right-sided pleural effusion and mild pulmonary edema due to fluid overload.  -Dr.  Koren BoundWesam Yacoub (PCCM) consulted at 6/3 advised against thoracentesis for fluid removal -Nephrology consult for HD; per Dr. Delano Metzobert Schertz (nephrology) patient unable to tolerate HD due to persistent hypotension and now 2 events of cardiac arrest. Per her note plan is to meet with family, will await recommendations. Continue Midodrine per Nephrology  HCAP with mild COPD exacerbation.  -Sputum and blood cultures pending -Continue aztreonam + vancomycin -Continue Solu-Medrol 60 mg daily  -Albuterol nebulizer  QID  -Flutter valve  q 4hr while awake  -Mucinex DM BID   OSA/OHS -CPAP/BiPAP QHS respiratory  2xepisodes of cardiac arrest and persistent hypotension in the setting of hemodialysis.  -Discontinue all BP medication  -Midodrine per nephrology  - Dr. Delano Metzobert Schertz (nephrology)will initiate the conversation with family and patient about DO NOT RESUSCITATE and stopping future dialysis treatments.   ESRD. On M/W/F - Last HD session stopped after 1 hour    Chronic diastolic CHF  -EF is now 50% on recent echo gram less than one month ago. - Mild fluid overload due to unfinished dialysis  Diabetes type 2 - Increase Lantus 15 units QHS -continue pre-meal NovoLog 5 units scheduled, along with sliding scale.   Dyslipidemia - continue Zocor 80 mg daily    CAD/PAD.  -Patient on statin, aspirin.   Hypothyroidism. -Continue Synthroid 25 mcg daily -Obtain TSH    Hx hypertension.  -Currently patient having extreme difficulty with hypotension hold all BP lowering medication  GERD  - continue PPI   Hx DVT/ PE. No acute issues - not on anticoagulation  -Last admission had CT angiogram of the chest which was negative for PE.   UTI (E. Coli) -Continue current antibiotics await sensitivities     Code Status: FULL Family  Communication: no family present at time of exam Disposition Plan: Await nephrology recommendations    Consultants: Dr.  Koren Bound (PCCM) Dr. Delano Metz (nephrology)  Procedure/Significant Events:    Culture 6/3 Blood left antecubital/left hand NTD  6/3 Urine positive Escherichia coli 6/3 MRSA by PCR negative   Antibiotics: Vanc 6/3 >>>  Zosyn 6/3 >>>  DVT prophylaxis: Heparin   Devices NA   LINES / TUBES:  ?? Lt tunneled HD cath. 6/3 20ga left forearm 6/3 20ga right antecubital      Continuous Infusions:   Objective: VITAL SIGNS: Temp: 97.4 F (36.3 C) (06/04 1406) Temp src: Oral (06/04 1406) BP: 133/61 mmHg (06/04 1406) Pulse Rate: 76 (06/04 1406) SPO2; 95% on 5 L O2 via [place] FIO2:   Intake/Output Summary (Last 24 hours) at 04/06/14 1449 Last data filed at 04/06/14 1406  Gross per 24 hour  Intake    220 ml  Output   3544 ml  Net  -3324 ml     Exam: General: A./O. x4, NAD, No acute respiratory distress Lungs: Diffuse poor air movement, positive diffuse wheezes  Cardiovascular: Regular rate and rhythm without murmur gallop or rub normal S1 and S2 Abdomen: Nontender, nondistended, soft, bowel sounds positive, no rebound, no ascites, no appreciable mass Extremities: No significant cyanosis, clubbing, or edema bilateral lower extremities  Data Reviewed: Basic Metabolic Panel:  Recent Labs Lab 04/05/14 1100 04/05/14 1417 04/06/14 1127  NA 137  --  135*  K 4.5  --  4.2  CL 97  --  95*  CO2 30  --  28  GLUCOSE 148*  --  278*  BUN 17  --  29*  CREATININE 1.54* 1.64* 2.07*  CALCIUM 8.9  --  9.2   Liver Function Tests:  Recent Labs Lab 04/05/14 1100  AST 57*  ALT 40*  ALKPHOS 280*  BILITOT 0.3  PROT 6.8  ALBUMIN 3.1*   No results found for this basename: LIPASE, AMYLASE,  in the last 168 hours No results found for this basename: AMMONIA,  in the last 168 hours CBC:  Recent Labs Lab 04/05/14 1100 04/05/14 1417  WBC  17.9* 17.0*  NEUTROABS 15.5*  --   HGB 10.8* 12.0  HCT 33.7* 37.0  MCV 93.4 93.2  PLT 275 259   Cardiac Enzymes: No results found for this basename: CKTOTAL, CKMB, CKMBINDEX, TROPONINI,  in the last 168 hours BNP (last 3 results)  Recent Labs  10/26/13 0510 11/07/13 0455 02/24/14 0900  PROBNP 9552.0* 22434.0* 32267.0*   CBG:  Recent Labs Lab 04/05/14 1843 04/05/14 2133 04/06/14 0814  GLUCAP 207* 220* 277*    Recent Results (from the past 240 hour(s))  CULTURE, BLOOD (ROUTINE X 2)     Status: None   Collection Time    04/05/14 11:00 AM      Result Value Ref Range Status   Specimen Description BLOOD LEFT ANTECUBITAL   Final   Special Requests BOTTLES DRAWN AEROBIC ONLY 10CC   Final   Culture  Setup Time     Final   Value: 04/05/2014 17:19     Performed at [facility]   Culture     Final   Value:  BLOOD CULTURE RECEIVED NO GROWTH TO DATE CULTURE WILL BE HELD FOR 5 DAYS BEFORE ISSUING A FINAL NEGATIVE REPORT     Performed at [facility]   Report Status PENDING   Incomplete  CULTURE, BLOOD (ROUTINE X 2)     Status: None   Collection Time    04/05/14 11:05 AM      Result Value Ref Range Status   Specimen Description BLOOD LEFT HAND   Final   Special Requests BOTTLES DRAWN AEROBIC ONLY 5CC   Final   Culture  Setup Time     Final   Value: 04/05/2014 17:19     Performed at [facility]   Culture     Final   Value:        BLOOD CULTURE RECEIVED NO GROWTH TO DATE CULTURE WILL BE HELD FOR 5 DAYS BEFORE ISSUING A FINAL NEGATIVE REPORT     Performed at [facility]   Report Status PENDING   Incomplete  MRSA PCR SCREENING     Status: None   Collection Time    04/05/14  4:01 PM      Result Value Ref Range Status   MRSA by PCR NEGATIVE  NEGATIVE Final   Comment:            The GeneXpert MRSA Assay (FDA     approved for NASAL specimens     only), is one component of a     comprehensive MRSA colonization     surveillance program.  It is not     intended to diagnose MRSA     infection nor to guide or     monitor treatment for     MRSA infections.     Studies:  Recent x-ray studies have been reviewed in detail by the Attending Physician  Scheduled Meds:  Scheduled Meds: . albuterol  2.5 mg Nebulization Q6H  . aspirin EC  81 mg Oral Daily  . aztreonam  1 g Intravenous Q24H  . budesonide  0.25 mg Nebulization BID  . calcium acetate  667 mg Oral TID WC  . collagenase   Topical BID  . feeding supplement (NEPRO CARB STEADY)  237 mL Oral BID BM  . heparin  5,000 Units Subcutaneous 3 times per day  . insulin aspart  0-5 Units Subcutaneous QHS  . insulin aspart  0-9 Units Subcutaneous TID WC  . insulin aspart  5 Units Subcutaneous TID WC  . insulin glargine  10 Units Subcutaneous QHS  . levothyroxine  25 mcg Oral QAC breakfast  . methylPREDNISolone (SOLU-MEDROL) injection  60 mg Intravenous Q12H  . [START ON 04/07/2014] midodrine  5 mg Oral Q M,W,F-2000  . [START ON 04/07/2014] midodrine  5 mg Oral Q M,W,F-2000  . multivitamin  1 tablet Oral Daily  . pantoprazole  40 mg Oral Daily  . simvastatin  80 mg Oral QPM  . tiotropium  18 mcg Inhalation Daily  . [START ON 04/07/2014] vancomycin  750 mg Intravenous Q M,W,F-HD  . vancomycin  750 mg Intravenous Q Thu-HD    Time spent on care of this patient: 40 mins   Drema Dallas , MD   [facility] Office  [phone] Pager [phone]  On-Call/Text Page:      [email or web address]      password [email or web address]  If 7PM-7AM, please contact night-coverage [email or web address] Password [email or web address] 04/06/2014, 2:49 PM   LOS: 1 day

## 2014-04-06 NOTE — Progress Notes (Signed)
Pt is alert and oriented this am to time, self and situation. Pt was placed from non-rebreather last night to nasal cannula at 02 5 liters but this am started to desaturate to 80% and even after pt was coached to take deep breath through nose and blow out through mouth and turning oxygen up to 6 liters pt remained at 80%. Placed non-rebreather back on pt at 15 liters and pt's oxygen saturation came up to 95%.

## 2014-04-06 NOTE — Progress Notes (Signed)
Pt is now gone to dialysis with dialysis nurse. Pt ate breakfast prior to going. Pt on non-rebreather and Renal doctor was in and seen pt this am and nurse updated MD on pt's oxygen saturation dropping and pt put back on nonrebreather. Pt's oxygen was in 90's when pt left floor and vitals all within normal limits.

## 2014-04-06 NOTE — Consult Note (Signed)
WOC wound consult note Reason for Consult: Stage III Pressure ulcer to left medial heel, present on admission.  Wound type:Stage III Pressure ulcer Pressure Ulcer POA: Yes/ Measurement: 2.2 cm x 3.4 cm x 1 cm Wound bed: 50% yellow, adherent slough, 50% pale pink wound bed.   Drainage (amount, consistency, odor) Minimal, serosanguinous drainage.  No odor.  Periwound: Calloused periwound, circumferentially.  Dressing procedure/placement/frequency:Cleanse left heel ulcer with NS and pat gently dry.  Apply Santyl ointment to wound bed, 1/8" thickness (opaque) and top with NS moist dressing, filling in wound depth. Top with 4x4 gauze and secure with kerlix and tape. Change twice daily.   Will not follow at this time.  Please re-consult if needed.  Maple Hudson RN BSN CWON Pager [phone]

## 2014-04-07 DIAGNOSIS — I5042 Chronic combined systolic (congestive) and diastolic (congestive) heart failure: Secondary | ICD-10-CM | POA: Diagnosis present

## 2014-04-07 LAB — CBC WITH DIFFERENTIAL/PLATELET
BASOS PCT: 0 % (ref 0–1)
Basophils Absolute: 0 10*3/uL (ref 0.0–0.1)
Eosinophils Absolute: 0 10*3/uL (ref 0.0–0.7)
Eosinophils Relative: 0 % (ref 0–5)
HEMATOCRIT: 34.3 % — AB (ref 36.0–46.0)
HEMOGLOBIN: 10.8 g/dL — AB (ref 12.0–15.0)
LYMPHS ABS: 0.4 10*3/uL — AB (ref 0.7–4.0)
Lymphocytes Relative: 2 % — ABNORMAL LOW (ref 12–46)
MCH: 29.8 pg (ref 26.0–34.0)
MCHC: 31.5 g/dL (ref 30.0–36.0)
MCV: 94.8 fL (ref 78.0–100.0)
MONO ABS: 0.6 10*3/uL (ref 0.1–1.0)
MONOS PCT: 3 % (ref 3–12)
NEUTROS ABS: 18.9 10*3/uL — AB (ref 1.7–7.7)
NEUTROS PCT: 95 % — AB (ref 43–77)
Platelets: 288 10*3/uL (ref 150–400)
RBC: 3.62 MIL/uL — AB (ref 3.87–5.11)
RDW: 16.4 % — ABNORMAL HIGH (ref 11.5–15.5)
WBC: 19.9 10*3/uL — ABNORMAL HIGH (ref 4.0–10.5)

## 2014-04-07 LAB — URINE CULTURE: Colony Count: >=100000

## 2014-04-07 LAB — LEGIONELLA ANTIGEN, URINE: LEGIONELLA ANTIGEN, URINE: NEGATIVE

## 2014-04-07 LAB — COMPREHENSIVE METABOLIC PANEL
ALBUMIN: 3.1 g/dL — AB (ref 3.5–5.2)
ALT: 28 U/L (ref 0–35)
AST: 20 U/L (ref 0–37)
Alkaline Phosphatase: 239 U/L — ABNORMAL HIGH (ref 39–117)
BUN: 36 mg/dL — ABNORMAL HIGH (ref 6–23)
CALCIUM: 8.8 mg/dL (ref 8.4–10.5)
CO2: 25 mEq/L (ref 19–32)
Chloride: 90 mEq/L — ABNORMAL LOW (ref 96–112)
Creatinine, Ser: 1.99 mg/dL — ABNORMAL HIGH (ref 0.50–1.10)
GFR calc Af Amer: 28 mL/min — ABNORMAL LOW (ref >90)
GFR calc non Af Amer: 24 mL/min — ABNORMAL LOW (ref >90)
Glucose, Bld: 290 mg/dL — ABNORMAL HIGH (ref 70–99)
Potassium: 4.4 mEq/L (ref 3.7–5.3)
SODIUM: 129 meq/L — AB (ref 137–147)
TOTAL PROTEIN: 6.8 g/dL (ref 6.0–8.3)
Total Bilirubin: <0.2 mg/dL — ABNORMAL LOW (ref 0.3–1.2)

## 2014-04-07 LAB — BLOOD GAS, ARTERIAL
Acid-Base Excess: 0.3 mmol/L (ref 0.0–2.0)
Bicarbonate: 26 mEq/L — ABNORMAL HIGH (ref 20.0–24.0)
DRAWN BY: 347671
FIO2: 0.4 %
O2 Saturation: 89.8 %
PCO2 ART: 54.3 mmHg — AB (ref 35.0–45.0)
PH ART: 7.302 — AB (ref 7.350–7.450)
Patient temperature: 98.6
TCO2: 27.6 mmol/L (ref 0–100)
pO2, Arterial: 57.7 mmHg — ABNORMAL LOW (ref 80.0–100.0)

## 2014-04-07 LAB — GLUCOSE, CAPILLARY
GLUCOSE-CAPILLARY: 253 mg/dL — AB (ref 70–99)
GLUCOSE-CAPILLARY: 342 mg/dL — AB (ref 70–99)

## 2014-04-07 LAB — MAGNESIUM: Magnesium: 1.6 mg/dL (ref 1.5–2.5)

## 2014-04-07 LAB — TSH: TSH: 1.81 u[IU]/mL (ref 0.350–4.500)

## 2014-04-07 MED ORDER — CARVEDILOL 3.125 MG PO TABS
3.1250 mg | ORAL_TABLET | Freq: Two times a day (BID) | ORAL | Status: DC
  Administered 2014-04-07 – 2014-04-08 (×2): 3.125 mg via ORAL
  Filled 2014-04-07 (×4): qty 1

## 2014-04-07 MED ORDER — INSULIN GLARGINE 100 UNIT/ML ~~LOC~~ SOLN
20.0000 [IU] | Freq: Every day | SUBCUTANEOUS | Status: DC
  Administered 2014-04-07 – 2014-04-08 (×2): 20 [IU] via SUBCUTANEOUS
  Filled 2014-04-07 (×3): qty 0.2

## 2014-04-07 MED ORDER — INSULIN ASPART 100 UNIT/ML ~~LOC~~ SOLN
10.0000 [IU] | Freq: Three times a day (TID) | SUBCUTANEOUS | Status: DC
  Administered 2014-04-08 – 2014-04-09 (×5): 10 [IU] via SUBCUTANEOUS

## 2014-04-07 NOTE — Progress Notes (Signed)
[place] TEAM 1 - Stepdown/ICU TEAM Progress Note  [name] [name] NWG:[id number] DOB: [birth year] DOA: 04/05/2014 PCP: Quentin Mulling, MD  Admit HPI / Brief Narrative: [name] [name] is a 73 y.o. WF PMHx Hx GI bleed 01/2014, hypothyroidism, 3 vessel CAD, ESRD on HD M/W/F, Chronic diastolic heart failure last known EF 50% one month ago,cardiac arrest x 2 (4/24 and 5/11) both occurred at HD, hypothyroidism, CAD, history of DVT and PE, anemia of chronic disease, COPD on 2 L oxygen at nighttime and as needed and daytime, PAD, who was recently admitted 10 days ago after she went of PEA 03/13/2014 which happened during her dialysis, patient at that underwent CPR and was intubated for 4 days, she was seen by electrophysiology M.D. Dr. Johney Frame saw the patient and recommended that patient's Coreg be discontinued and advised against a pacemaker at that time, she should also had mild pulmonary edema and left lower lobe pleural effusion during her last visit. Apparently patient has had a cardiac arrest associated with dialysis once previously as well i.e. total of 2 times.  Today patient went for dialysis again she's been having issues with low blood pressure during her dialysis treatments, today 1 hour into the dialysis treatment her blood pressure dropped to mid 70s, she received some normal saline in the dialysis unit her dialysis was stopped, she was then sent to the ER via EMS, during transportation patient was noted to be hypoxic, when she arrived in the ER she was placed initially on a nonrebreather thereafter her ABGs showed acute hypercapnic respiratory failure and she was placed on BiPAP. She was also noted to have a leukocytosis, mildly worsened left sided pleural effusion, I was called to admit the patient.   HPI/Subjective: 6/5 significantly improve, patient sitting comfortably on the edge of bed speaking with family.   Assessment/Plan:  Acute hypercapnic respiratory failure  - etiology is unclear most likely  multi-factorial secondary to HCAP, mild COPD exacerbation, pleural effusion with mild fluid overload as well.  -Significantly improve, will ambulate patient in the a.m. on her home regimen of 2 L O2, and if able to ambulate adequately discharge  Diastolic and systolic CHF/tachycardia/HTN -Patient's BP has trended up will restart Coreg 3.125 mg BID  Right-sided pleural effusion and mild pulmonary edema due to fluid overload.  -Dr.  Koren Bound (PCCM) consulted at 6/3 advised against thoracentesis for fluid removal -Nephrology consult for HD; per Dr. Delano Metz (nephrology) patient unable to tolerate HD due to persistent hypotension and now 2 events of cardiac arrest. Per her note plan is to meet with family, will await recommendations. Continue Midodrine per Nephrology  HCAP with mild COPD exacerbation.  -Sputum and blood cultures pending -Continue aztreonam + vancomycin -Continue Solu-Medrol 60 mg daily  -Albuterol nebulizer  QID  -Flutter valve  q 4hr while awake  -Mucinex DM BID  Leukocytosis -In patient who is clinically improved, negative left shift or bands,, and afebrile most likely secondary to steroids   OSA/OHS -CPAP/BiPAP QHS respiratory  2xepisodes of cardiac arrest and persistent hypotension in the setting of hemodialysis.  -Discontinue all BP medication  -Midodrine per nephrology  - Dr. Delano Metz (nephrology)will initiate the conversation with family and patient about DO NOT RESUSCITATE and stopping future dialysis treatments.   ESRD. On M/W/F - Completed full HD today     Chronic diastolic CHF  -EF is now 50% on recent echo gram less than one month ago. - Mild fluid overload due to unfinished dialysis  Diabetes  type 2 - Increase Lantus 20 units QHS -Increase pre-meal NovoLog 10 units scheduled, along with sliding scale.   Dyslipidemia - continue Zocor 80 mg daily    CAD/PAD.  -Patient on statin, aspirin.   Hypothyroidism. -Continue Synthroid 25  mcg daily -TSH  within normal limit  GERD  - continue PPI   Hx DVT/ PE. No acute issues - not on anticoagulation  -Last admission had CT angiogram of the chest which was negative for PE.   UTI (E. Coli) -Continue current antibiotics await sensitivities     Code Status: FULL Family Communication: no family present at time of exam Disposition Plan: Await nephrology recommendations    Consultants: Dr.  Koren Bound (PCCM) Dr. Delano Metz (nephrology)  Procedure/Significant Events: 5/13 echocardiogram - Left ventricle: Hypokinesis at the base of the inferior wall.  -LVEF=50%.  -(grade 1 diastolic dysfunction). 6/3 PCXR;Moderate layering left pleural effusion, increased    Culture 6/3 Blood left antecubital/left hand NTD  6/3 Urine positive Escherichia coli 6/3 MRSA by PCR negative   Antibiotics: Vanc 6/3 >>>  Zosyn 6/3 >>>  DVT prophylaxis: Heparin   Devices NA   LINES / TUBES:  ?? Lt tunneled HD cath. 6/3 20ga left forearm 6/3 20ga right antecubital      Continuous Infusions:   Objective: VITAL SIGNS: Temp: 98 F (36.7 C) (06/05 1608) Temp src: Oral (06/05 1608) BP: 172/70 mmHg (06/05 1608) Pulse Rate: 110 (06/05 1608) SPO2; 95% on 5 L O2 via [place] FIO2:   Intake/Output Summary (Last 24 hours) at 04/07/14 1820 Last data filed at 04/07/14 1220  Gross per 24 hour  Intake    527 ml  Output   2904 ml  Net  -2377 ml     Exam: General: A./O. x4, NAD, No acute respiratory distress Lungs: Diffuse poor air movement (improved from 6/4), negative wheezes, positive mild left basilar crackles  Cardiovascular: Tachycardic, Regular rhythm without murmur gallop or rub normal S1 and S2 Abdomen: Nontender, nondistended, soft, bowel sounds positive, no rebound, no ascites, no appreciable mass Extremities: No significant cyanosis, clubbing, or edema bilateral lower extremities  Data Reviewed: Basic Metabolic Panel:  Recent Labs Lab 04/05/14 1100  04/05/14 1417 04/06/14 1127 04/07/14 0500  NA 137  --  135* 129*  K 4.5  --  4.2 4.4  CL 97  --  95* 90*  CO2 30  --  28 25  GLUCOSE 148*  --  278* 290*  BUN 17  --  29* 36*  CREATININE 1.54* 1.64* 2.07* 1.99*  CALCIUM 8.9  --  9.2 8.8  MG  --   --   --  1.6   Liver Function Tests:  Recent Labs Lab 04/05/14 1100 04/07/14 0500  AST 57* 20  ALT 40* 28  ALKPHOS 280* 239*  BILITOT 0.3 <0.2*  PROT 6.8 6.8  ALBUMIN 3.1* 3.1*   No results found for this basename: LIPASE, AMYLASE,  in the last 168 hours No results found for this basename: AMMONIA,  in the last 168 hours CBC:  Recent Labs Lab 04/05/14 1100 04/05/14 1417 04/07/14 0500  WBC 17.9* 17.0* 19.9*  NEUTROABS 15.5*  --  18.9*  HGB 10.8* 12.0 10.8*  HCT 33.7* 37.0 34.3*  MCV 93.4 93.2 94.8  PLT 275 259 288   Cardiac Enzymes: No results found for this basename: CKTOTAL, CKMB, CKMBINDEX, TROPONINI,  in the last 168 hours BNP (last 3 results)  Recent Labs  10/26/13 0510 11/07/13 0455 02/24/14 0900  PROBNP  9552.0* 22434.0* 32267.0*   CBG:  Recent Labs Lab 04/06/14 1524 04/06/14 1711 04/06/14 2220 04/07/14 1251 04/07/14 1604  GLUCAP 145* 260* 319* 253* 342*    Recent Results (from the past 240 hour(s))  CULTURE, BLOOD (ROUTINE X 2)     Status: None   Collection Time    04/05/14 11:00 AM      Result Value Ref Range Status   Specimen Description BLOOD LEFT ANTECUBITAL   Final   Special Requests BOTTLES DRAWN AEROBIC ONLY 10CC   Final   Culture  Setup Time     Final   Value: 04/05/2014 17:19     Performed at [facility]   Culture     Final   Value:        BLOOD CULTURE RECEIVED NO GROWTH TO DATE CULTURE WILL BE HELD FOR 5 DAYS BEFORE ISSUING A FINAL NEGATIVE REPORT     Performed at [facility]   Report Status PENDING   Incomplete  CULTURE, BLOOD (ROUTINE X 2)     Status: None   Collection Time    04/05/14 11:05 AM      Result Value Ref Range Status   Specimen Description  BLOOD LEFT HAND   Final   Special Requests BOTTLES DRAWN AEROBIC ONLY 5CC   Final   Culture  Setup Time     Final   Value: 04/05/2014 17:19     Performed at [facility]   Culture     Final   Value:        BLOOD CULTURE RECEIVED NO GROWTH TO DATE CULTURE WILL BE HELD FOR 5 DAYS BEFORE ISSUING A FINAL NEGATIVE REPORT     Performed at [facility]   Report Status PENDING   Incomplete  URINE CULTURE     Status: None   Collection Time    04/05/14  1:54 PM      Result Value Ref Range Status   Specimen Description URINE, RANDOM   Final   Special Requests NONE   Final   Culture  Setup Time     Final   Value: 04/05/2014 18:56     Performed at [facility] Count     Final   Value: >=100,000 COLONIES/ML     Performed at [facility]   Culture     Final   Value: ESCHERICHIA COLI     Performed at [facility]   Report Status 04/07/2014 FINAL   Final   Organism ID, Bacteria ESCHERICHIA COLI   Final  MRSA PCR SCREENING     Status: None   Collection Time    04/05/14  4:01 PM      Result Value Ref Range Status   MRSA by PCR NEGATIVE  NEGATIVE Final   Comment:            The GeneXpert MRSA Assay (FDA     approved for NASAL specimens     only), is one component of a     comprehensive MRSA colonization     surveillance program. It is not     intended to diagnose MRSA     infection nor to guide or     monitor treatment for     MRSA infections.     Studies:  Recent x-ray studies have been reviewed in detail by the Attending Physician  Scheduled Meds:  Scheduled Meds: . albuterol  2.5 mg Nebulization Q6H  . aspirin EC  81 mg Oral Daily  . aztreonam  1 g Intravenous Q24H  . budesonide  0.25 mg Nebulization BID  . calcium acetate  667 mg Oral TID WC  . collagenase   Topical BID  . dextromethorphan-guaiFENesin  1 tablet Oral BID  . feeding supplement (NEPRO CARB STEADY)  237 mL Oral BID BM  . heparin  5,000 Units Subcutaneous 3 times  per day  . insulin aspart  0-5 Units Subcutaneous QHS  . insulin aspart  0-9 Units Subcutaneous TID WC  . insulin aspart  5 Units Subcutaneous TID WC  . insulin glargine  15 Units Subcutaneous QHS  . levothyroxine  25 mcg Oral QAC breakfast  . methylPREDNISolone (SOLU-MEDROL) injection  60 mg Intravenous Q24H  . midodrine  5 mg Oral Q M,W,F-2000  . midodrine  5 mg Oral Q M,W,F-2000  . multivitamin  1 tablet Oral Daily  . pantoprazole  40 mg Oral Daily  . simvastatin  80 mg Oral QPM  . tiotropium  18 mcg Inhalation Daily  . vancomycin  750 mg Intravenous Q M,W,F-HD    Time spent on care of this patient: 40 mins   Drema Dallasurtis J Aarit Kashuba , MD   [facility] Office  [phone] Pager [phone]- [phone]  On-Call/Text Page:      [email or web address]      password [email or web address]  If 7PM-7AM, please contact night-coverage [email or web address] Password [email or web address] 04/07/2014, 6:20 PM   LOS: 2 days

## 2014-04-07 NOTE — Procedures (Signed)
I was present at this dialysis session, have reviewed the session itself and made  appropriate changes  [name] MD (pgr) [phone]    ([phone] 04/07/2014, 1:06 PM

## 2014-04-07 NOTE — Progress Notes (Signed)
[facility] Progress Note   Subjective: 3 kg off yest w lowest BP 106/60 yest on HD.    Filed Vitals:   04/07/14 0758 04/07/14 0828 04/07/14 0901 04/07/14 0925  BP: 164/78 164/88 152/71 152/69  Pulse: 108 110 110 109  Temp:      TempSrc:      Resp:      Height:      Weight:      SpO2:       Exam: Alert no distress No JVD  Chest improved L base, R clear  RRR tachy no MRG  Abd obese, NTND, no ascites  No LE edema or UE edema, 1.5x 1.5 cm L heel ulcer open Neuro mod gen weakness, nf, Ox3  L IJ HD cath   HD: MWF [facility]  4h    F160    62kg    2/2.5 Bath   Prof 4    Heparin none  Hect 2     Aranesp 60 q week was due today 6/3  ECHO 03/15/14 LV 50% EF, RV dysfunction probable (poor visualization)   Assessment:  1 Hypotension- recurrent major issue with this patient, multifactorial w ESRD and 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 I am reviewing home meds to see home many hypotensive agents she is actually taking at home.  Without heart or BP meds her BP is high enough to get significant fluid. She is also on pre HD midodrine 5mg . Still 2kg over dry wt, HD again today. Continue to hold all BP meds and nitrates. 2 Acute on chronic resp failure- resolved, on [place] oxygen today 3 ESRD on HD  4 CAD with RCA angioplasty this year  5 LV dysfunction w EF 30%  6 Obesity  7 COPD on home O2  8 Recent cardiac arrest x 2 (4/24 and 5/11) both occurred at HD  9 Hx PE/DVT in past  10 HTN/volume- as above 11 Left pleural effusion - improved on exam 12 Left heel ulcer- WC evaluating 13 EOL - pt not interested in DNR or stopping dialysis   Plan- HD today, looks better and may be able to be dc'd today after HD. [name]    Rob Simar Pothier MD  pager [phone]    cell [phone]  04/07/2014, 10:16 AM     Recent Labs Lab 04/05/14 1100 04/05/14 1417 04/06/14 1127 04/07/14 0500  NA 137  --  135* 129*  K 4.5  --  4.2 4.4  CL 97  --  95*  90*  CO2 30  --  28 25  GLUCOSE 148*  --  278* 290*  BUN 17  --  29* 36*  CREATININE 1.54* 1.64* 2.07* 1.99*  CALCIUM 8.9  --  9.2 8.8    Recent Labs Lab 04/05/14 1100 04/07/14 0500  AST 57* 20  ALT 40* 28  ALKPHOS 280* 239*  BILITOT 0.3 <0.2*  PROT 6.8 6.8  ALBUMIN 3.1* 3.1*    Recent Labs Lab 04/05/14 1100 04/05/14 1417 04/07/14 0500  WBC 17.9* 17.0* 19.9*  NEUTROABS 15.5*  --  18.9*  HGB 10.8* 12.0 10.8*  HCT 33.7* 37.0 34.3*  MCV 93.4 93.2 94.8  PLT 275 259 288   . albuterol  2.5 mg Nebulization Q6H  . aspirin EC  81 mg Oral Daily  . aztreonam  1 g Intravenous Q24H  . budesonide  0.25 mg Nebulization BID  . calcium acetate  667 mg Oral TID WC  .  collagenase   Topical BID  . dextromethorphan-guaiFENesin  1 tablet Oral BID  . feeding supplement (NEPRO CARB STEADY)  237 mL Oral BID BM  . heparin  5,000 Units Subcutaneous 3 times per day  . insulin aspart  0-5 Units Subcutaneous QHS  . insulin aspart  0-9 Units Subcutaneous TID WC  . insulin aspart  5 Units Subcutaneous TID WC  . insulin glargine  15 Units Subcutaneous QHS  . levothyroxine  25 mcg Oral QAC breakfast  . methylPREDNISolone (SOLU-MEDROL) injection  60 mg Intravenous Q24H  . midodrine  5 mg Oral Q M,W,F-2000  . midodrine  5 mg Oral Q M,W,F-2000  . multivitamin  1 tablet Oral Daily  . pantoprazole  40 mg Oral Daily  . simvastatin  80 mg Oral QPM  . tiotropium  18 mcg Inhalation Daily  . vancomycin  750 mg Intravenous Q M,W,F-HD  . vancomycin  750 mg Intravenous Q Thu-HD     cyclobenzaprine, nitroGLYCERIN

## 2014-04-07 NOTE — Progress Notes (Signed)
Pt refuses CPAP for the night. She states she does not wear it at home. She only wears oxygen when she sleeps at 3-4L. RT will continue to monitor.

## 2014-04-07 NOTE — Progress Notes (Signed)
Results for IRERI, NEWBROUGH (MRN [id number]) as of 04/07/2014 09:40  Ref. Range 04/05/2014 21:33 04/06/2014 08:14 04/06/2014 15:24 04/06/2014 17:11 04/06/2014 22:20  Glucose-Capillary Latest Range: 70-99 mg/dL 103 (H) 128 (H) 118 (H) 260 (H) 319 (H)  CBGs continue to be greater than 180 mg/dl. May need to increase Lantus to 18 -20 (0.3 units/kg) units if CBGs continue to be elevated.  Will follow in hospital.  Smith Mince RN BSN CDE

## 2014-04-08 LAB — GLUCOSE, CAPILLARY
GLUCOSE-CAPILLARY: 161 mg/dL — AB (ref 70–99)
GLUCOSE-CAPILLARY: 78 mg/dL (ref 70–99)
Glucose-Capillary: 149 mg/dL — ABNORMAL HIGH (ref 70–99)
Glucose-Capillary: 240 mg/dL — ABNORMAL HIGH (ref 70–99)
Glucose-Capillary: 275 mg/dL — ABNORMAL HIGH (ref 70–99)
Glucose-Capillary: 304 mg/dL — ABNORMAL HIGH (ref 70–99)

## 2014-04-08 LAB — COMPREHENSIVE METABOLIC PANEL
ALK PHOS: 236 U/L — AB (ref 39–117)
ALT: 36 U/L — AB (ref 0–35)
AST: 34 U/L (ref 0–37)
Albumin: 3.3 g/dL — ABNORMAL LOW (ref 3.5–5.2)
BUN: 30 mg/dL — AB (ref 6–23)
CHLORIDE: 94 meq/L — AB (ref 96–112)
CO2: 25 mEq/L (ref 19–32)
Calcium: 8.9 mg/dL (ref 8.4–10.5)
Creatinine, Ser: 1.58 mg/dL — ABNORMAL HIGH (ref 0.50–1.10)
GFR calc Af Amer: 37 mL/min — ABNORMAL LOW (ref >90)
GFR calc non Af Amer: 32 mL/min — ABNORMAL LOW (ref >90)
Glucose, Bld: 231 mg/dL — ABNORMAL HIGH (ref 70–99)
Potassium: 4.6 mEq/L (ref 3.7–5.3)
SODIUM: 132 meq/L — AB (ref 137–147)
TOTAL PROTEIN: 7.1 g/dL (ref 6.0–8.3)

## 2014-04-08 LAB — CBC WITH DIFFERENTIAL/PLATELET
BASOS ABS: 0 10*3/uL (ref 0.0–0.1)
Basophils Relative: 0 % (ref 0–1)
Eosinophils Absolute: 0 10*3/uL (ref 0.0–0.7)
Eosinophils Relative: 0 % (ref 0–5)
HCT: 37.7 % (ref 36.0–46.0)
Hemoglobin: 11.9 g/dL — ABNORMAL LOW (ref 12.0–15.0)
LYMPHS ABS: 0.5 10*3/uL — AB (ref 0.7–4.0)
Lymphocytes Relative: 3 % — ABNORMAL LOW (ref 12–46)
MCH: 29.5 pg (ref 26.0–34.0)
MCHC: 31.6 g/dL (ref 30.0–36.0)
MCV: 93.3 fL (ref 78.0–100.0)
MONO ABS: 0.2 10*3/uL (ref 0.1–1.0)
Monocytes Relative: 1 % — ABNORMAL LOW (ref 3–12)
NEUTROS ABS: 15.9 10*3/uL — AB (ref 1.7–7.7)
Neutrophils Relative %: 96 % — ABNORMAL HIGH (ref 43–77)
PLATELETS: 274 10*3/uL (ref 150–400)
RBC: 4.04 MIL/uL (ref 3.87–5.11)
RDW: 16.4 % — AB (ref 11.5–15.5)
WBC: 16.6 10*3/uL — ABNORMAL HIGH (ref 4.0–10.5)

## 2014-04-08 LAB — MAGNESIUM: Magnesium: 1.7 mg/dL (ref 1.5–2.5)

## 2014-04-08 MED ORDER — HYDRALAZINE HCL 20 MG/ML IJ SOLN: 5.0000 mg | INTRAMUSCULAR | Status: DC | PRN

## 2014-04-08 MED ORDER — CEFUROXIME AXETIL 500 MG PO TABS
500.0000 mg | ORAL_TABLET | Freq: Two times a day (BID) | ORAL | Status: DC
  Administered 2014-04-08 – 2014-04-09 (×2): 500 mg via ORAL
  Filled 2014-04-08 (×5): qty 1

## 2014-04-08 MED ORDER — ALBUTEROL SULFATE (2.5 MG/3ML) 0.083% IN NEBU
2.5000 mg | INHALATION_SOLUTION | Freq: Three times a day (TID) | RESPIRATORY_TRACT | Status: DC
  Administered 2014-04-09: 2.5 mg via RESPIRATORY_TRACT
  Filled 2014-04-08: qty 3

## 2014-04-08 MED ORDER — METOPROLOL TARTRATE 1 MG/ML IV SOLN
5.0000 mg | Freq: Once | INTRAVENOUS | Status: AC
  Administered 2014-04-08: 5 mg via INTRAVENOUS
  Filled 2014-04-08: qty 5

## 2014-04-08 MED ORDER — CARVEDILOL 6.25 MG PO TABS
6.2500 mg | ORAL_TABLET | Freq: Two times a day (BID) | ORAL | Status: DC
  Administered 2014-04-08 – 2014-04-09 (×2): 6.25 mg via ORAL
  Filled 2014-04-08 (×4): qty 1

## 2014-04-08 MED ORDER — ALBUTEROL SULFATE (2.5 MG/3ML) 0.083% IN NEBU: 2.5000 mg | INHALATION_SOLUTION | Freq: Four times a day (QID) | RESPIRATORY_TRACT | Status: DC | PRN

## 2014-04-08 NOTE — Progress Notes (Signed)
[facility] TEAM 1 - Stepdown/ICU TEAM Progress Note  [name] [name] BOF:[id number] DOB: [birth year] DOA: 04/05/2014 PCP: Quentin Mulling, MD  Admit HPI / Brief Narrative: [name] [name] is a 73 y.o. WF PMHx Hx GI bleed 01/2014, hypothyroidism, 3 vessel CAD, ESRD on HD M/W/F, Chronic diastolic heart failure last known EF 50% one month ago,cardiac arrest x 2 (4/24 and 5/11) both occurred at HD, hypothyroidism, CAD, history of DVT and PE, anemia of chronic disease, COPD on 2 L oxygen at nighttime and as needed and daytime, PAD, who was recently admitted 10 days ago after she went of PEA 03/13/2014 which happened during her dialysis, patient at that underwent CPR and was intubated for 4 days, she was seen by electrophysiology M.D. Dr. Johney Frame saw the patient and recommended that patient's Coreg be discontinued and advised against a pacemaker at that time, she should also had mild pulmonary edema and left lower lobe pleural effusion during her last visit. Apparently patient has had a cardiac arrest associated with dialysis once previously as well i.e. total of 2 times.  Today patient went for dialysis again she's been having issues with low blood pressure during her dialysis treatments, today 1 hour into the dialysis treatment her blood pressure dropped to mid 70s, she received some normal saline in the dialysis unit her dialysis was stopped, she was then sent to the ER via EMS, during transportation patient was noted to be hypoxic, when she arrived in the ER she was placed initially on a nonrebreather thereafter her ABGs showed acute hypercapnic respiratory failure and she was placed on BiPAP. She was also noted to have a leukocytosis, mildly worsened left sided pleural effusion, I was called to admit the patient.   HPI/Subjective: 6/6 significantly improved. Ambulated around ward on 2 L O2 without dropping her SpO2<90%.   Assessment/Plan:  Acute hypercapnic respiratory failure  - etiology is unclear most likely  multi-factorial secondary to HCAP, mild COPD exacerbation, pleural effusion with mild fluid overload as well.  -Ambulated patient home regimen of 2 L O2, without dropping her SpO2<90%. -Awaiting callback from CSW  [name] [phone]  was working on discharging patient today or a.m. back to SNF -  Diastolic and systolic CHF/tachycardia/HTN -EF is now 50% on recent echo gram less than one month ago. - 6/6 per nephrology we were to hold all BP medication, however patient's BP now significantly elevated SBP now high 170s-180 . Start Coreg 6.25 BID -hydralazine 5 mg q 4hr PRN SBP > 170 over DBP> 100   Right-sided pleural effusion and mild pulmonary edema due to fluid overload.  -Dr.  Koren Bound (PCCM) consulted at 6/3 advised against thoracentesis for fluid removal -Nephrology consult for HD; per Dr. Delano Metz (nephrology) patient unable to tolerate HD due to persistent hypotension and now 2 events of cardiac arrest. Per her note plan is to meet with family, will await recommendations. Continue Midodrine per Nephrology  HCAP with mild COPD exacerbation.  -Sputum and blood cultures pending -Continue aztreonam + vancomycin -Continue Solu-Medrol 60 mg daily  -Albuterol nebulizer  QID  -Flutter valve  q 4hr while awake  -Mucinex DM BID  Leukocytosis -In patient who is clinically improved, negative left shift or bands,, and afebrile most likely secondary to steroids   OSA/OHS -CPAP/BiPAP QHS respiratory  2xepisodes of cardiac arrest and persistent hypotension in the setting of hemodialysis.  -Discontinue all BP medication  -Midodrine per nephrology  - Dr. Delano Metz (nephrology)will initiate the conversation with family and patient  about DO NOT RESUSCITATE and stopping future dialysis treatments.   ESRD. On M/W/F - Completed full HD today     Diabetes type 2 - Increase Lantus 20 units QHS -Increase pre-meal NovoLog 10 units scheduled, along with sliding scale.    Dyslipidemia - continue Zocor 80 mg daily    CAD/PAD.  -Patient on statin, aspirin.   Hypothyroidism. -Continue Synthroid 25 mcg daily -TSH  within normal limit  GERD  - continue PPI   Hx DVT/ PE. No acute issues - not on anticoagulation  -Last admission had CT angiogram of the chest which was negative for PE.   UTI (E. Coli) -Continue current antibiotics await sensitivities     Code Status: FULL Family Communication: no family present at time of exam Disposition Plan: Await nephrology recommendations    Consultants: Dr.  Koren BoundWesam Yacoub (PCCM) Dr. Delano Metzobert Schertz (nephrology)  Procedure/Significant Events: 5/13 echocardiogram - Left ventricle: Hypokinesis at the base of the inferior wall.  -LVEF=50%.  -(grade 1 diastolic dysfunction). 6/3 PCXR;Moderate layering left pleural effusion, increased    Culture 6/3 Blood left antecubital/left hand NTD  6/3 Urine positive Escherichia coli 6/3 MRSA by PCR negative   Antibiotics: Vanc 6/3 >>> stopped 6/6 Zosyn 6/3 x1dose Aztreonam 6/4>> stopped 6/6 Ceftin 6/6>>  DVT prophylaxis: Heparin   Devices NA   LINES / TUBES:  ?? Lt tunneled HD cath. 6/3 20ga left forearm 6/3 20ga right antecubital      Continuous Infusions:   Objective: VITAL SIGNS: BP: 170/65 mmHg (06/06 1200) Pulse Rate: 108 (06/06 1200) SPO2; 95% on 5 L O2 via [place] FIO2:   Intake/Output Summary (Last 24 hours) at 04/08/14 1229 Last data filed at 04/07/14 1847  Gross per 24 hour  Intake     50 ml  Output     50 ml  Net      0 ml     Exam: General: A./O. x4, NAD, No acute respiratory distress Lungs: Diffuse poor air movement (improved from 6/4), negative wheezes, positive mild left basilar crackles  Cardiovascular: Tachycardic, Regular rhythm without murmur gallop or rub normal S1 and S2 Abdomen: Nontender, nondistended, soft, bowel sounds positive, no rebound, no ascites, no appreciable mass Extremities: No significant  cyanosis, clubbing, or edema bilateral lower extremities  Data Reviewed: Basic Metabolic Panel:  Recent Labs Lab 04/05/14 1100 04/05/14 1417 04/06/14 1127 04/07/14 0500 04/08/14 0329  NA 137  --  135* 129* 132*  K 4.5  --  4.2 4.4 4.6  CL 97  --  95* 90* 94*  CO2 30  --  28 25 25   GLUCOSE 148*  --  278* 290* 231*  BUN 17  --  29* 36* 30*  CREATININE 1.54* 1.64* 2.07* 1.99* 1.58*  CALCIUM 8.9  --  9.2 8.8 8.9  MG  --   --   --  1.6 1.7   Liver Function Tests:  Recent Labs Lab 04/05/14 1100 04/07/14 0500 04/08/14 0329  AST 57* 20 34  ALT 40* 28 36*  ALKPHOS 280* 239* 236*  BILITOT 0.3 <0.2* <0.2*  PROT 6.8 6.8 7.1  ALBUMIN 3.1* 3.1* 3.3*   No results found for this basename: LIPASE, AMYLASE,  in the last 168 hours No results found for this basename: AMMONIA,  in the last 168 hours CBC:  Recent Labs Lab 04/05/14 1100 04/05/14 1417 04/07/14 0500 04/08/14 0329  WBC 17.9* 17.0* 19.9* 16.6*  NEUTROABS 15.5*  --  18.9* 15.9*  HGB 10.8* 12.0 10.8* 11.9*  HCT 33.7* 37.0 34.3* 37.7  MCV 93.4 93.2 94.8 93.3  PLT 275 259 288 274   Cardiac Enzymes: No results found for this basename: CKTOTAL, CKMB, CKMBINDEX, TROPONINI,  in the last 168 hours BNP (last 3 results)  Recent Labs  10/26/13 0510 11/07/13 0455 02/24/14 0900  PROBNP 9552.0* 22434.0* 32267.0*   CBG:  Recent Labs Lab 04/07/14 1251 04/07/14 1604 04/08/14 0027 04/08/14 0833 04/08/14 1006  GLUCAP 253* 342* 149* 240* 275*    Recent Results (from the past 240 hour(s))  CULTURE, BLOOD (ROUTINE X 2)     Status: None   Collection Time    04/05/14 11:00 AM      Result Value Ref Range Status   Specimen Description BLOOD LEFT ANTECUBITAL   Final   Special Requests BOTTLES DRAWN AEROBIC ONLY 10CC   Final   Culture  Setup Time     Final   Value: 04/05/2014 17:19     Performed at [facility]   Culture     Final   Value:        BLOOD CULTURE RECEIVED NO GROWTH TO DATE CULTURE WILL BE HELD FOR  5 DAYS BEFORE ISSUING A FINAL NEGATIVE REPORT     Performed at [facility]   Report Status PENDING   Incomplete  CULTURE, BLOOD (ROUTINE X 2)     Status: None   Collection Time    04/05/14 11:05 AM      Result Value Ref Range Status   Specimen Description BLOOD LEFT HAND   Final   Special Requests BOTTLES DRAWN AEROBIC ONLY 5CC   Final   Culture  Setup Time     Final   Value: 04/05/2014 17:19     Performed at [facility]   Culture     Final   Value:        BLOOD CULTURE RECEIVED NO GROWTH TO DATE CULTURE WILL BE HELD FOR 5 DAYS BEFORE ISSUING A FINAL NEGATIVE REPORT     Performed at [facility]   Report Status PENDING   Incomplete  URINE CULTURE     Status: None   Collection Time    04/05/14  1:54 PM      Result Value Ref Range Status   Specimen Description URINE, RANDOM   Final   Special Requests NONE   Final   Culture  Setup Time     Final   Value: 04/05/2014 18:56     Performed at [facility] Count     Final   Value: >=100,000 COLONIES/ML     Performed at [facility]   Culture     Final   Value: ESCHERICHIA COLI     Performed at [facility]   Report Status 04/07/2014 FINAL   Final   Organism ID, Bacteria ESCHERICHIA COLI   Final  MRSA PCR SCREENING     Status: None   Collection Time    04/05/14  4:01 PM      Result Value Ref Range Status   MRSA by PCR NEGATIVE  NEGATIVE Final   Comment:            The GeneXpert MRSA Assay (FDA     approved for NASAL specimens     only), is one component of a     comprehensive MRSA colonization     surveillance program. It is not     intended to diagnose MRSA  infection nor to guide or     monitor treatment for     MRSA infections.     Studies:  Recent x-ray studies have been reviewed in detail by the Attending Physician  Scheduled Meds:  Scheduled Meds: . albuterol  2.5 mg Nebulization Q6H  . aspirin EC  81 mg Oral Daily  . aztreonam  1 g Intravenous Q24H   . budesonide  0.25 mg Nebulization BID  . calcium acetate  667 mg Oral TID WC  . carvedilol  6.25 mg Oral BID WC  . collagenase   Topical BID  . dextromethorphan-guaiFENesin  1 tablet Oral BID  . feeding supplement (NEPRO CARB STEADY)  237 mL Oral BID BM  . heparin  5,000 Units Subcutaneous 3 times per day  . insulin aspart  0-5 Units Subcutaneous QHS  . insulin aspart  0-9 Units Subcutaneous TID WC  . insulin aspart  10 Units Subcutaneous TID WC  . insulin glargine  20 Units Subcutaneous QHS  . levothyroxine  25 mcg Oral QAC breakfast  . methylPREDNISolone (SOLU-MEDROL) injection  60 mg Intravenous Q24H  . metoprolol  5 mg Intravenous Once  . midodrine  5 mg Oral Q M,W,F-2000  . midodrine  5 mg Oral Q M,W,F-2000  . multivitamin  1 tablet Oral Daily  . pantoprazole  40 mg Oral Daily  . simvastatin  80 mg Oral QPM  . tiotropium  18 mcg Inhalation Daily  . vancomycin  750 mg Intravenous Q M,W,F-HD    Time spent on care of this patient: 40 mins   Drema Dallas , MD   [facility] Office  [phone] Pager [phone]  On-Call/Text Page:      [email or web address]      password [email or web address]  If 7PM-7AM, please contact night-coverage [email or web address] Password [email or web address] 04/08/2014, 12:29 PM   LOS: 3 days

## 2014-04-08 NOTE — Progress Notes (Deleted)
Per automatic BP; systolic BP 88-91, manual pressure 70-72 systolic.  Pt is alert and oriented, denies symptoms of hypotension.  MD paged

## 2014-04-08 NOTE — Progress Notes (Signed)
Pt refuses CPAP tonight. RT will continue to monitor.  

## 2014-04-08 NOTE — Clinical Social Work Note (Signed)
CSW made aware by MD patient is ready for d/c to [facility] on 6/7. CSW spoke with patient and contacted patient's son who are agreeable to d/c plan. CSW contacted [facility] and confirmed patient return to facility. CSW instructed to fax d/c summary to [phone]. CSW to provide follow-up tomorrow regarding d/c.  Zanyah Lentsch Patrick-[place], LCSWA Weekend Clinical Social Worker [phone]

## 2014-04-08 NOTE — Plan of Care (Signed)
Problem: Phase I Progression Outcomes Goal: Hemodynamically stable Outcome: Progressing MD aware of increasing BP trend

## 2014-04-08 NOTE — Progress Notes (Signed)
[facility] Progress Note   Subjective: EColi sensitive to Ancef and Rocephin.  No complaints  Filed Vitals:   04/08/14 0600 04/08/14 0750 04/08/14 0844 04/08/14 0930  BP: 158/72  163/89   Pulse: 104  110   Temp:      TempSrc:      Resp: 15     Height:      Weight:      SpO2: 95% 96% 92% 94%   Exam: Alert no distress, alert, up at side of bed No JVD  Chest bronchial BS L base, R clear  RRR tachy no MRG  Abd obese, NTND, no ascites  No LE edema or UE edema, 1.5x 1.5 cm L heel ulcer open Neuro mod gen weakness, nf, Ox3  L IJ HD cath   HD: MWF [facility]  4h    F160    62kg    2/2.5 Bath   Prof 4    Heparin none  Hect 2     Aranesp 60 q week was due today 6/3  ECHO 03/15/14 LV 50% EF, RV dysfunction probable (poor visualization)   Assessment:  1 Hypotension- recurrent issue, much better off of BP meds and nitrates.  BP a little high, and volume down to just below dry wt.  Would recommend permissive HTN in this patient due to the problems she has been having with hypotension during and after HD including cardiac arrest x 2 this year. Cont midodrine 5 mg pre HD only for now.  2 EColi UTI - we can treat this with Ancef IV at OP dialysis 3 ESRD on HD  4 CAD with RCA angioplasty this year  5 LV dysfunction w EF 30%  6 Obesity  7 COPD on home O2  8 Recent cardiac arrest x 2 (4/24 and 5/11) both occurred at HD  9 Hx PE/DVT in past  10 HTN/volume- as above 11 Left pleural effusion - improved on exam 12 Left heel ulcer- WC evaluating 13 EOL - pt not interested in DNR or stopping dialysis 14- Dispo- back to SNF when ready   Plan- disposition per primary, if she is d/c'd we can give IV Ancef at HD for UTI, let us know duration of Rx    [name] MD  pager [phone]    cell [phone]  04/08/2014, 10:30 AM     Recent Labs Lab 04/06/14 1127 04/07/14 0500 04/08/14 0329  NA 135* 129* 132*  K 4.2 4.4 4.6  CL 95* 90* 94*  CO2 28 25 25   GLUCOSE 278* 290*  231*  BUN 29* 36* 30*  CREATININE 2.07* 1.99* 1.58*  CALCIUM 9.2 8.8 8.9    Recent Labs Lab 04/05/14 1100 04/07/14 0500 04/08/14 0329  AST 57* 20 34  ALT 40* 28 36*  ALKPHOS 280* 239* 236*  BILITOT 0.3 <0.2* <0.2*  PROT 6.8 6.8 7.1  ALBUMIN 3.1* 3.1* 3.3*    Recent Labs Lab 04/05/14 1100 04/05/14 1417 04/07/14 0500 04/08/14 0329  WBC 17.9* 17.0* 19.9* 16.6*  NEUTROABS 15.5*  --  18.9* 15.9*  HGB 10.8* 12.0 10.8* 11.9*  HCT 33.7* 37.0 34.3* 37.7  MCV 93.4 93.2 94.8 93.3  PLT 275 259 288 274   . albuterol  2.5 mg Nebulization Q6H  . aspirin EC  81 mg Oral Daily  . aztreonam  1 g Intravenous Q24H  . budesonide  0.25 mg Nebulization BID  . calcium acetate  667 mg Oral TID WC  . carvedilol  3.125 mg Oral  BID WC  . collagenase   Topical BID  . dextromethorphan-guaiFENesin  1 tablet Oral BID  . feeding supplement (NEPRO CARB STEADY)  237 mL Oral BID BM  . heparin  5,000 Units Subcutaneous 3 times per day  . insulin aspart  0-5 Units Subcutaneous QHS  . insulin aspart  0-9 Units Subcutaneous TID WC  . insulin aspart  10 Units Subcutaneous TID WC  . insulin glargine  20 Units Subcutaneous QHS  . levothyroxine  25 mcg Oral QAC breakfast  . methylPREDNISolone (SOLU-MEDROL) injection  60 mg Intravenous Q24H  . midodrine  5 mg Oral Q M,W,F-2000  . midodrine  5 mg Oral Q M,W,F-2000  . multivitamin  1 tablet Oral Daily  . pantoprazole  40 mg Oral Daily  . simvastatin  80 mg Oral QPM  . tiotropium  18 mcg Inhalation Daily  . vancomycin  750 mg Intravenous Q M,W,F-HD     cyclobenzaprine, nitroGLYCERIN

## 2014-04-09 LAB — CBC WITH DIFFERENTIAL/PLATELET
Basophils Absolute: 0 10*3/uL (ref 0.0–0.1)
Basophils Relative: 0 % (ref 0–1)
Eosinophils Absolute: 0.1 10*3/uL (ref 0.0–0.7)
Eosinophils Relative: 1 % (ref 0–5)
HCT: 40.9 % (ref 36.0–46.0)
Hemoglobin: 13 g/dL (ref 12.0–15.0)
LYMPHS ABS: 0.9 10*3/uL (ref 0.7–4.0)
LYMPHS PCT: 7 % — AB (ref 12–46)
MCH: 29.8 pg (ref 26.0–34.0)
MCHC: 31.8 g/dL (ref 30.0–36.0)
MCV: 93.8 fL (ref 78.0–100.0)
Monocytes Absolute: 0.5 10*3/uL (ref 0.1–1.0)
Monocytes Relative: 4 % (ref 3–12)
NEUTROS PCT: 89 % — AB (ref 43–77)
Neutro Abs: 12.7 10*3/uL — ABNORMAL HIGH (ref 1.7–7.7)
PLATELETS: ADEQUATE 10*3/uL (ref 150–400)
RBC: 4.36 MIL/uL (ref 3.87–5.11)
RDW: 16.7 % — ABNORMAL HIGH (ref 11.5–15.5)
WBC: 14.2 10*3/uL — ABNORMAL HIGH (ref 4.0–10.5)

## 2014-04-09 LAB — GLUCOSE, CAPILLARY
GLUCOSE-CAPILLARY: 257 mg/dL — AB (ref 70–99)
GLUCOSE-CAPILLARY: 299 mg/dL — AB (ref 70–99)

## 2014-04-09 LAB — COMPREHENSIVE METABOLIC PANEL
ALBUMIN: 3.1 g/dL — AB (ref 3.5–5.2)
ALT: 32 U/L (ref 0–35)
AST: 71 U/L — ABNORMAL HIGH (ref 0–37)
Alkaline Phosphatase: 216 U/L — ABNORMAL HIGH (ref 39–117)
BUN: 63 mg/dL — ABNORMAL HIGH (ref 6–23)
CHLORIDE: 91 meq/L — AB (ref 96–112)
CO2: 19 meq/L (ref 19–32)
CREATININE: 2.58 mg/dL — AB (ref 0.50–1.10)
Calcium: 9 mg/dL (ref 8.4–10.5)
GFR calc Af Amer: 21 mL/min — ABNORMAL LOW (ref >90)
GFR, EST NON AFRICAN AMERICAN: 18 mL/min — AB (ref >90)
Glucose, Bld: 183 mg/dL — ABNORMAL HIGH (ref 70–99)
Potassium: 7.6 mEq/L (ref 3.7–5.3)
Sodium: 127 mEq/L — ABNORMAL LOW (ref 137–147)
Total Protein: 7.1 g/dL (ref 6.0–8.3)

## 2014-04-09 LAB — BASIC METABOLIC PANEL
BUN: 69 mg/dL — ABNORMAL HIGH (ref 6–23)
CALCIUM: 9.2 mg/dL (ref 8.4–10.5)
CO2: 24 mEq/L (ref 19–32)
Chloride: 91 mEq/L — ABNORMAL LOW (ref 96–112)
Creatinine, Ser: 2.72 mg/dL — ABNORMAL HIGH (ref 0.50–1.10)
GFR calc Af Amer: 19 mL/min — ABNORMAL LOW (ref >90)
GFR calc non Af Amer: 17 mL/min — ABNORMAL LOW (ref >90)
Glucose, Bld: 292 mg/dL — ABNORMAL HIGH (ref 70–99)
Potassium: 5.3 mEq/L (ref 3.7–5.3)
SODIUM: 129 meq/L — AB (ref 137–147)

## 2014-04-09 LAB — MAGNESIUM: MAGNESIUM: 1.6 mg/dL (ref 1.5–2.5)

## 2014-04-09 MED ORDER — INSULIN ASPART 100 UNIT/ML ~~LOC~~ SOLN: 0.0000 [IU] | SUBCUTANEOUS | Status: AC

## 2014-04-09 MED ORDER — INSULIN ASPART 100 UNIT/ML ~~LOC~~ SOLN
0.0000 [IU] | SUBCUTANEOUS | Status: DC
  Administered 2014-04-09: 8 [IU] via SUBCUTANEOUS

## 2014-04-09 MED ORDER — DM-GUAIFENESIN ER 30-600 MG PO TB12: 1.0000 | ORAL_TABLET | Freq: Two times a day (BID) | ORAL | Status: AC

## 2014-04-09 MED ORDER — CEFUROXIME AXETIL 500 MG PO TABS
500.0000 mg | ORAL_TABLET | Freq: Two times a day (BID) | ORAL | Status: DC
Start: 2014-04-09 — End: 2014-05-08

## 2014-04-09 MED ORDER — INSULIN ASPART 100 UNIT/ML ~~LOC~~ SOLN: 10.0000 [IU] | Freq: Three times a day (TID) | SUBCUTANEOUS | Status: AC

## 2014-04-09 MED ORDER — INSULIN GLARGINE 100 UNIT/ML ~~LOC~~ SOLN: 20.0000 [IU] | Freq: Every day | SUBCUTANEOUS | Status: DC

## 2014-04-09 MED ORDER — MIDODRINE HCL 5 MG PO TABS: ORAL_TABLET | ORAL | Status: DC

## 2014-04-09 MED ORDER — METHYLPREDNISOLONE SODIUM SUCC 125 MG IJ SOLR: 60.0000 mg | INTRAMUSCULAR | Status: DC

## 2014-04-09 NOTE — Progress Notes (Signed)
[facility] Progress Note   Subjective: EColi sensitive to Ancef and Rocephin.  No complaints  Filed Vitals:   04/08/14 2044 04/09/14 0024 04/09/14 0434 04/09/14 0805  BP:  100/81 139/59   Pulse:  82 86   Temp:   97.9 F (36.6 C)   TempSrc:   Oral   Resp:   14   Height:      Weight:      SpO2: 99% 100% 100% 100%   Exam: Alert no distress, alert, up at side of bed No JVD  Chest bronchial BS L base, R clear  RRR tachy no MRG  Abd obese, NTND, no ascites  No LE edema or UE edema, 1.5x 1.5 cm L heel ulcer open Neuro mod gen weakness, nf, Ox3  L IJ HD cath   HD: MWF [facility]  4h    F160    62kg    2/2.5 Bath   Prof 4    Heparin none  Hect 2     Aranesp 60 q week was due today 6/3  ECHO 03/15/14 LV 50% EF, RV dysfunction probable (poor visualization)   Assessment:  1 Hypotension- recurrent issue, much better off of BP meds and nitrates.  No BP-lowering medications for this patient at this time, BP-lowering meds should be avoided and only prescribed by her nephrologists from now on through until the end of her life. We are going to allow her BP to run high so we can lower her risk of passing out or arresting during or after dialysis.  She has arrested twice in HD this year and has had 2-3 other hypotension-at-dialysis-related admissions also so far this year.  She is very sensitive to BP medications and we recommend no BP meds at all for now, they are doing more harm than good in this patient at this point in her life.  These problems are primarily a consequence of her RV dysfunction which in turn is related to her long-term pulm disease and pulm HTN.   2 EColi UTI - on po abx 3 ESRD on HD high K today, rechecking for lab error 4 CAD with RCA angioplasty this year  5 LV dysfunction w EF 30%  6 Obesity  7 COPD on home O2  8 Recent cardiac arrest x 2 (4/24 and 5/11) both occurred at HD  9 Hx PE/DVT in past  10 HTN/volume- as above, continue midodrine 5 mg preHD  tiw 11 Left pleural effusion - improved on exam 12 Left heel ulcer- WC evaluating 13 EOL - pt not interested in DNR or stopping dialysis 14- Dispo- back to SNF when ready   Plan- await repeat K, HD today if needed, o/w ok for d/c from renal standpoint.      Vinson Moselleob Stacy Sailer MD  pager [phone]    cell [phone]  04/09/2014, 9:19 AM     Recent Labs Lab 04/07/14 0500 04/08/14 0329 04/09/14 0332  NA 129* 132* 127*  K 4.4 4.6 7.6*  CL 90* 94* 91*  CO2 25 25 19   GLUCOSE 290* 231* 183*  BUN 36* 30* 63*  CREATININE 1.99* 1.58* 2.58*  CALCIUM 8.8 8.9 9.0    Recent Labs Lab 04/07/14 0500 04/08/14 0329 04/09/14 0332  AST 20 34 71*  ALT 28 36* 32  ALKPHOS 239* 236* 216*  BILITOT <0.2* <0.2* <0.2*  PROT 6.8 7.1 7.1  ALBUMIN 3.1* 3.3* 3.1*    Recent Labs Lab 04/07/14 0500 04/08/14 0329 04/09/14 0332  WBC 19.9* 16.6*  14.2*  NEUTROABS 18.9* 15.9* 12.7*  HGB 10.8* 11.9* 13.0  HCT 34.3* 37.7 40.9  MCV 94.8 93.3 93.8  PLT 288 274 PLATELETS APPEAR ADEQUATE   . albuterol  2.5 mg Nebulization TID  . aspirin EC  81 mg Oral Daily  . budesonide  0.25 mg Nebulization BID  . calcium acetate  667 mg Oral TID WC  . carvedilol  6.25 mg Oral BID WC  . cefUROXime  500 mg Oral BID WC  . collagenase   Topical BID  . dextromethorphan-guaiFENesin  1 tablet Oral BID  . feeding supplement (NEPRO CARB STEADY)  237 mL Oral BID BM  . heparin  5,000 Units Subcutaneous 3 times per day  . insulin aspart  0-5 Units Subcutaneous QHS  . insulin aspart  0-9 Units Subcutaneous TID WC  . insulin aspart  10 Units Subcutaneous TID WC  . insulin glargine  20 Units Subcutaneous QHS  . levothyroxine  25 mcg Oral QAC breakfast  . methylPREDNISolone (SOLU-MEDROL) injection  60 mg Intravenous Q24H  . midodrine  5 mg Oral Q M,W,F-2000  . midodrine  5 mg Oral Q M,W,F-2000  . multivitamin  1 tablet Oral Daily  . pantoprazole  40 mg Oral Daily  . simvastatin  80 mg Oral QPM  . tiotropium  18 mcg  Inhalation Daily     albuterol, cyclobenzaprine, hydrALAZINE, nitroGLYCERIN

## 2014-04-09 NOTE — Discharge Summary (Signed)
Physician Discharge Summary  [name] [name] RUE:[id number] DOB: 24-Jan-1941 DOA: 04/05/2014  PCP: [name] Mulling, MD  Admit date: 04/05/2014 Discharge date: 04/09/2014  Time spent: 40 minutes  Recommendations for Outpatient Follow-up:   Acute hypercapnic respiratory failure  -Was most likely multi-factorial secondary to HCAP, mild COPD exacerbation, pleural effusion with mild fluid overload as well. Resolved -Ambulated patient home regimen of 2 L O2, without dropping her SpO2<90%.  -CSW [name] [name] [phone] aware of patient will be discharged today, back to  [facility]  -  Diastolic and systolic CHF/tachycardia/HTN  -EF is now 50% on recent echo gram less than one month ago.  - 6/7 per nephrology in order for patient to tolerate HD will need to discontinue all BP medication. In future ONLY NEPHROLOGY TO ADD BP MEDICATION.  Right-sided pleural effusion and mild pulmonary edema due to fluid overload.  -Dr. Koren [name] (PCCM) consulted at 6/3 advised against thoracentesis for fluid removal  -Nephrology consult for HD; per Dr. Delano [name] (nephrology) patient unable to tolerate HD due to persistent hypotension and now 2 events of cardiac arrest.  -Per Dr. Delano [name] (nephrology) patient and family unwilling to discuss palliative/hospice care at this time.  -Discharge on Midodrine per Nephrology   HCAP with mild COPD exacerbation.  -Continue Solu-Medrol 60 mg daily stop date 6/10 -Continue Albuterol nebulizer QID upon discharge -Discharge with Flutter valve q 4hr while awake  -Continue upon discharge Mucinex DM BID   Leukocytosis  -In patient who is clinically improved, negative left shift or bands,, and afebrile most likely secondary to steroids   OSA/OHS  -Patient does not recall being officially evaluated for OSA will refer to [facility] for sleep study.    2xepisodes of cardiac arrest and persistent hypotension in the setting of hemodialysis.   -Discontinue all BP medication per nephrology request -Midodrine per nephrology  -Continue HD on M./W./F.  .  ESRD. On M/W/F  -Due for normal HD session in the a.m.    Diabetes type 2  - Discharge on Lantus 20 units QHS  -Discharge on pre-meal NovoLog 10 units scheduled, along with moderate sliding scale.   Dyslipidemia  - continue Zocor 80 mg daily   CAD/PAD.  -Patient on statin, aspirin.   Hypothyroidism.  -Continue Synthroid 25 mcg daily  -TSH within normal limit   GERD  - continue PPI   Hx DVT/ PE. No acute issues  - not on anticoagulation  -Last admission had CT angiogram of the chest which was negative for PE.  -Patient is a high fall risk, discharge on aspirin only  UTI (E. 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)   DM (diabetes mellitus), type 2 with renal complications   ESRD on hemodialysis- (MWF in [place])   Cardiac arrest with hypotension/ transient bradycardia- brief CPR- 02/24/14   Cardiomyopathy, ischemic- EF 30-35%   HCAP (healthcare-associated pneumonia)   Pleural effusion   OSA on CPAP   Obesity hypoventilation syndrome   UTI (urinary tract infection)   Systolic and diastolic  CHF, chronic   Discharge Condition: Stable  Diet recommendation: Nephro/carb modified   Filed Weights   04/06/14 1406 04/07/14 0727 04/07/14 1116  Weight: 62.2 kg (137 lb 2 oz) 64.2 kg (141 lb 8.6 oz) 61.5 kg (135 lb 9.3 oz)    History of present illness:  [name] [name] is a 73 y.o. WF PMHx Hx GI bleed 01/2014, hypothyroidism, 3 vessel CAD, ESRD on HD M/W/F, Chronic  diastolic heart failure last known EF 50% one month ago,cardiac arrest x 2 (4/24 and 5/11) both occurred at HD, hypothyroidism, CAD, history of DVT and PE, anemia of chronic disease, COPD on 2 L oxygen at nighttime and as needed and daytime, PAD, who was recently admitted 10 days ago after she went of PEA 03/13/2014 which happened during her dialysis, patient at that underwent CPR and was intubated for 4 days, she was seen by electrophysiology M.D. Dr. Johney [name] saw the patient and recommended that patient's Coreg be discontinued and advised against a pacemaker at that time, she should also had mild pulmonary edema and left lower lobe pleural effusion during her last visit. Apparently patient has had a cardiac arrest associated with dialysis once previously as well i.e. total of 2 times.  Today patient went for dialysis again she's been having issues with low blood pressure during her dialysis treatments, today 1 hour into the dialysis treatment her blood pressure dropped to mid 70s, she received some normal saline in the dialysis unit her dialysis was stopped, she was then sent to the ER via EMS, during transportation patient was noted to be hypoxic, when she arrived in the ER she was placed initially on a nonrebreather thereafter her ABGs showed acute hypercapnic respiratory failure and she was placed on BiPAP. She was also noted to have a leukocytosis, mildly worsened left sided pleural effusion. During her stay patient treated for HCAP and Escherichia coli UTI.   Consultants:  Dr. Koren [name] (PCCM)  Dr. Delano [name] (nephrology)     Procedure/Significant Events:  5/13 echocardiogram  - Left ventricle: Hypokinesis at the base of the inferior wall.  -LVEF=50%.  -(grade 1 diastolic dysfunction). 6/3 PCXR;Moderate layering left pleural effusion, increased     Culture  6/3 Blood left antecubital/left hand NTD  6/3 Urine positive Escherichia coli  6/3 MRSA by PCR negative     Antibiotics:   Vanc 6/3 >>> stopped 6/6  Zosyn 6/3 x1dose  Aztreonam 6/4>> stopped 6/6  Ceftin 6/6>> stop date 6/11     Discharge Exam: Filed Vitals:   04/09/14 0434 04/09/14 0800 04/09/14 0805 04/09/14 0935  BP: 139/59 164/72  164/72  Pulse: 86 92  98  Temp: 97.9 F (36.6 C) 97.5 F (36.4 C)    TempSrc: Oral Oral    Resp: 14 19    Height:      Weight:      SpO2: 100% 94% 100%     General: A./O. x4, NAD, No acute respiratory distress  Lungs: Diffuse poor air movement (improved from 6/4), negative wheezes, positive mild left basilar crackles  Cardiovascular: Tachycardic, Regular rhythm without murmur gallop or rub normal S1 and S2  Abdomen: Nontender, nondistended, soft, bowel sounds positive, no rebound, no ascites, no appreciable mass  Extremities: No significant cyanosis, clubbing, or edema bilateral lower extremities  Discharge Instructions     Medication List    STOP taking these medications       amLODipine 5 MG tablet  Commonly known as:  NORVASC     carvedilol 3.125  MG tablet  Commonly known as:  COREG     isosorbide mononitrate 30 MG 24 hr tablet  Commonly known as:  IMDUR      TAKE these medications       albuterol (2.5 MG/3ML) 0.083% nebulizer solution  Commonly known as:  PROVENTIL  Take 2.5 mg by nebulization every 6 (six) hours as needed for wheezing or shortness of breath.     PROAIR HFA 108 (90 BASE) MCG/ACT inhaler  Generic drug:  albuterol  Inhale 2 puffs into the lungs every 4 (four) hours as needed for wheezing or shortness of breath.     aspirin EC 81 MG tablet  Take 81 mg by mouth daily.     budesonide 0.25 MG/2ML nebulizer solution  Commonly known as:  PULMICORT  Take 2 mLs (0.25 mg total) by nebulization 2 (two) times daily.     calcium acetate 667 MG capsule  Commonly known as:  PHOSLO  Take 667 mg by mouth 3 (three) times daily with meals.     cefUROXime 500 MG tablet  Commonly known as:  CEFTIN  Take 1 tablet (500 mg total) by mouth 2  (two) times daily with a meal.     cyclobenzaprine 10 MG tablet  Commonly known as:  FLEXERIL  Take 1 tablet (10 mg total) by mouth 3 (three) times daily as needed for muscle spasms.     dextromethorphan-guaiFENesin 30-600 MG per 12 hr tablet  Commonly known as:  MUCINEX DM  Take 1 tablet by mouth 2 (two) times daily.     feeding supplement (NEPRO CARB STEADY) Liqd  Take 237 mLs by mouth 2 (two) times daily between meals.     insulin aspart 100 UNIT/ML injection  Commonly known as:  novoLOG  Inject 10 Units into the skin 3 (three) times daily with meals.     insulin aspart 100 UNIT/ML injection  Commonly known as:  novoLOG  Inject 0-15 Units into the skin every 4 (four) hours.     insulin glargine 100 UNIT/ML injection  Commonly known as:  LANTUS  Inject 0.2 mLs (20 Units total) into the skin at bedtime.     levothyroxine 25 MCG tablet  Commonly known as:  SYNTHROID, LEVOTHROID  Take 25 mcg by mouth daily.     methylPREDNISolone sodium succinate 125 mg/2 mL injection  Commonly known as:  SOLU-MEDROL  Inject 0.96 mLs (60 mg total) into the vein daily.     midodrine 5 MG tablet  Commonly known as:  PROAMATINE  Take 5 mg tablet Monday, Wednesday, Friday,     multivitamin Tabs tablet  Take 1 tablet by mouth daily.     mupirocin cream 2 %  Commonly known as:  BACTROBAN  Apply topically daily. To left heel     nitroGLYCERIN 0.4 MG SL tablet  Commonly known as:  NITROSTAT  Place 0.4 mg under the tongue every 5 (five) minutes as needed for chest pain. For chest pain.     pantoprazole 40 MG tablet  Commonly known as:  PROTONIX  Take 40 mg by mouth daily.     PROBIOTIC PO  Take 1 capsule by mouth daily as needed (digestion).     simvastatin 80 MG tablet  Commonly known as:  ZOCOR  Take 1 tablet (80 mg total) by mouth every evening.     tiotropium 18 MCG inhalation capsule  Commonly known as:  SPIRIVA  Place 1 capsule (18 mcg total) into inhaler and inhale daily.  Allergies  Allergen Reactions  . Procaine Hcl Nausea And Vomiting and Other (See Comments)    [facility] has reaction of "comatose"   Follow-up Information   Follow up with HOOPER,[name], MD. Schedule an appointment as soon as possible for a visit in 1 week. [facility] followup)    Specialty:  Geriatric Medicine   Contact information:   [address] [place] [address] [phone]       Follow up with [facility]. (Establish care. Patient with history of OSA/OHS was not using CPAP. Evaluate for sleep study)    Specialty:  Pulmonology   Contact information:   [address] [place] [address] [phone]       The results of significant diagnostics from this hospitalization (including imaging, microbiology, ancillary and laboratory) are listed below for reference.    Significant Diagnostic Studies: Ct Angio Chest Pe W/cm &/or Wo Cm  03/13/2014   CLINICAL DATA:  Decreased oxygen saturation  EXAM: CT ANGIOGRAPHY CHEST WITH CONTRAST  TECHNIQUE: Multidetector CT imaging of the chest was performed using the standard protocol during bolus administration of intravenous contrast. Multiplanar CT image reconstructions and MIPs were obtained to evaluate the vascular anatomy.  CONTRAST:  77mL OMNIPAQUE IOHEXOL 350 MG/ML SOLN  COMPARISON:  11/08/2013  FINDINGS: An endotracheal tube, nasogastric catheter and left-sided dialysis catheter are again noted. The remains are narrowing of the right innominate vein appear in these changes are stable from the prior exam. A right-sided central venous line is noted as well.  Large bilateral pleural effusions are noted with lower lobe consolidation noted. These changes of increased in the interval from the prior exam. No focal pulmonary nodule is identified. Minimal patchy the upper lobe atelectasis is noted on right.  The thoracic aorta is well visualized and heavily calcified. No aneurysmal dilatation or dissection is  noted. The origins of the great vessels are patent however some stenosis is noted in the left subclavian artery beyond its origin secondary to both calcified and noncalcified plaque. Pulmonary artery is well visualized and demonstrates a normal branching pattern. No filling defects to suggest pulmonary emboli are identified. No significant hilar or mediastinal adenopathy is noted. Scattered small mediastinal lymph nodes are seen. In retrospect these are stable from the prior exam.  Scanning into the upper abdomen reveals very mild ascites. The osseous structures show no acute abnormality.  Review of the MIP images confirms the above findings.  IMPRESSION: No evidence of pulmonary emboli.  Bilateral pleural effusions with lower lobe consolidation. This is increased in the interval from the prior exam.  Tubes and lines as described.  Chronic changes as described.   Electronically Signed   By: Alcide Clever M.D.   On: 03/13/2014 18:42   Dg Chest Port 1 View  04/05/2014   CLINICAL DATA:  Pulmonary edema.  Endotracheal tube replacement.  EXAM: PORTABLE CHEST - 1 VIEW  COMPARISON:  One-view chest 03/17/2014  FINDINGS: The endotracheal tube terminates 5 cm above the carinal. A right subclavian line and left IJ dialysis catheter are stable in position. The heart size is normal. Interstitial edema is improved. Bilateral pleural effusions persist. Bibasilar airspace disease is worse on the left. While this likely represents atelectasis, infection is not excluded.  IMPRESSION: 1. Satisfactory positioning of the endotracheal tube tip. 2. Decreased interstitial edema. 3. Bilateral pleural effusions. 4. Mild bibasilar airspace disease, left greater than right. While this likely represents atelectasis, infection is not excluded.   Electronically Signed   By:  Gennette Pac M.D.   On: 04/05/2014 16:50   Dg Chest Port 1 View  04/05/2014   CLINICAL DATA:  Shortness of breath, dialysis  EXAM: PORTABLE CHEST - 1 VIEW  COMPARISON:   03/23/2014  FINDINGS: Moderate layering left pleural effusion, increased. No frank interstitial edema. No pneumothorax.  Cardiomegaly.  Left IJ dual lumen dialysis catheter terminating in the upper right atrium.  IMPRESSION: Moderate layering left pleural effusion, increased.   Electronically Signed   By: Charline Bills M.D.   On: 04/05/2014 09:57   Dg Chest Port 1 View  03/23/2014   CLINICAL DATA:  Chest pain, crackles  EXAM: PORTABLE CHEST - 1 VIEW  COMPARISON:  Prior chest x-ray 03/20/2014  FINDINGS: Left IJ approach tunneled hemodialysis catheter with catheter tips projecting over the distal SVC and superior cavoatrial junction. There is a right subclavian approach central venous catheter with the tip in the distal SVC. Stable cardiomegaly. Atherosclerotic calcifications noted in the transverse aorta. Improved interstitial edema with mild residual vascular congestion. Dense left basilar opacity favored to reflect a combination of layering pleural fluid and atelectasis. Superimposed pneumonia is difficult to exclude entirely. There is trace residual right pleural effusion. No pneumothorax. No acute osseous abnormality.  IMPRESSION: 1. Resolved pulmonary edema with only mild vascular congestion remaining. 2. Persistent dense left basilar opacity favored to reflect a moderately large layering effusion and associated atelectasis. Superimposed infiltrate is difficult to exclude radiographically. 3. Trace residual right pleural effusion. 4. Stable cardiomegaly and aortic atherosclerosis.   Electronically Signed   By: Malachy Moan M.D.   On: 03/23/2014 07:57   Dg Chest Port 1 View  03/20/2014   CLINICAL DATA:  Shortness of breath.  Pulmonary edema.  EXAM: PORTABLE CHEST - 1 VIEW  COMPARISON:  DG CHEST 1V PORT dated 03/18/2014  FINDINGS: Interim removal of endotracheal and NG tube. Right IJ catheter, dual-lumen left IJ catheter in stable position. Cardiomegaly with pulmonary vascular prominence diffuse bile  pulmonary edema with bilateral pleural effusions, particular on the left noted. No pneumothorax.  IMPRESSION: 1. Interim extubation removal of NG tube. Bilateral IJ lines in stable position. 2. Congestive heart failure with pulmonary edema and bilateral pleural effusions.   Electronically Signed   By: Maisie Fus  Register   On: 03/20/2014 07:40   Dg Chest Port 1 View  03/18/2014   CLINICAL DATA:  Endotracheal tube  EXAM: PORTABLE CHEST - 1 VIEW  COMPARISON:  03/17/2014  FINDINGS: Endotracheal tube ends in the mid thoracic trachea. An orogastric tube crosses the diaphragm. Right subclavian central line and left IJ split tip catheter are in stable position, located at the region of the upper cavoatrial junction.  Normal heart size and stable upper mediastinal contours. Significantly improved pulmonary edema. Haziness of the lower chest consistent with persistent pleural effusions and probable associated atelectasis. No pneumothorax.  IMPRESSION: 1. Well-positioned tubes and lines. 2. Improved pulmonary edema. 3. Small bilateral pleural effusion.   Electronically Signed   By: Tiburcio Pea M.D.   On: 03/18/2014 07:51   Dg Chest Port 1 View  03/17/2014   CLINICAL DATA:  Evaluate endotracheal tube  EXAM: PORTABLE CHEST - 1 VIEW  COMPARISON:  DG CHEST 1V PORT dated 03/16/2014; DG CHEST 1V PORT dated 03/14/2014; DG CHEST 1V PORT dated 03/13/2014; CT ANGIO CHEST W/CM &/OR WO/CM dated 03/13/2014  FINDINGS: Grossly unchanged cardiac silhouette and mediastinal contours with atherosclerotic plaque within the thoracic aorta. Stable position of support apparatus. No pneumothorax. Pulmonary vasculature appears less distinct  in the present examination with cephalization of flow. Suspected worsening of at least small layering bilateral effusions with associated worsening bilateral mid and lower lung heterogeneous opacities unchanged bones.  IMPRESSION: 1.  Stable positioning of support apparatus.  No pneumothorax. 2. Findings  suggestive of worsening pulmonary edema with interval increase in (at least) small sized layering bilateral effusions with associated worsening bilateral mid and lower lung atelectasis.   Electronically Signed   By: Simonne ComeJohn  Watts M.D.   On: 03/17/2014 07:24   Dg Chest Port 1 View  03/16/2014   CLINICAL DATA:  Assess endotracheal tube  EXAM: PORTABLE CHEST - 1 VIEW  COMPARISON:  DG CHEST 1V PORT dated 03/15/2014; DG CHEST 1V PORT dated 03/14/2014; CT ANGIO CHEST W/CM &/OR WO/CM dated 03/13/2014  FINDINGS: No change endotracheal tube again about 4.7 cm above the carinal. Although lines and tubes are also unchanged.  Cardiac enlargement with vascular congestion. Mild diffuse interstitial prominence with more focal consolidation right lower lobe. Hazy bibasilar densities suggesting pleural effusions, right greater than left.  IMPRESSION: Congestive heart failure with pulmonary edema, with improved aeration at the left base and no significant change on the right.   Electronically Signed   By: Esperanza Heiraymond  Rubner M.D.   On: 03/16/2014 07:21   Dg Chest Port 1 View  03/15/2014   CLINICAL DATA:  Shortness of breath.  EXAM: PORTABLE CHEST - 1 VIEW  COMPARISON:  03/14/2014  FINDINGS: Endotracheal tube is 4.8 cm above the carina. Dialysis catheter tip at the cavoatrial junction and stable. Right subclavian central line in the lower SVC. Again noted are basilar densities. Prominent interstitial markings suggest interstitial edema. The right cardiac silhouette is now obscured. Negative for a pneumothorax. Nasogastric tube extends into the abdomen.  IMPRESSION: Increased basilar densities, particularly on the right side. Findings suggest a combination of edema, pleural effusions and consolidation/atelectasis.  Support apparatuses are appropriately positioned.   Electronically Signed   By: Richarda OverlieAdam  Henn M.D.   On: 03/15/2014 07:21   Dg Chest Port 1 View  03/14/2014   CLINICAL DATA:  Hypoxia  EXAM: PORTABLE CHEST - 1 VIEW  COMPARISON:   Chest radiograph and chest CT Mar 13, 2014  FINDINGS: Endotracheal tube tip is 6.1 cm above the carina. The dual-lumen catheter tip is at the cavoatrial junction. The right central catheter tip is in the distal superior vena cava. No pneumothorax.  There are bilateral effusions with bibasilar consolidation. There is underlying interstitial edema. Heart is upper normal in size with pulmonary venous hypertension.  IMPRESSION: Tube and catheter positions as described without appreciable pneumothorax. Underlying congestive heart failure. Superimposed pneumonia in the bases cannot be excluded on this study.   Electronically Signed   By: Bretta BangWilliam  Woodruff M.D.   On: 03/14/2014 07:36   Dg Chest Port 1 View  03/13/2014   CLINICAL DATA:  Central line placement.  EXAM: PORTABLE CHEST - 1 VIEW  COMPARISON:  03/13/2014  FINDINGS: Right subclavian central line has been placed with the tip in the SVC. Left dialysis catheter, endotracheal tube and NG tube are unchanged. No pneumothorax. Bilateral lower lobe airspace opacities and effusions are again noted, stable. Heart is normal size.  IMPRESSION: New right subclavian central line in place with the tip in the SVC. No pneumothorax. Otherwise no significant change.   Electronically Signed   By: Charlett NoseKevin  Dover M.D.   On: 03/13/2014 16:33   Dg Chest Port 1 View  03/13/2014   CLINICAL DATA:  Hypoxia  EXAM: PORTABLE  CHEST - 1 VIEW  COMPARISON:  Study obtained earlier in the day  FINDINGS: Endotracheal tube tip is 3.9 cm above the carina. Central catheter tip is essentially at the cavoatrial junction. There is no a nasogastric tube with the tip and side-port in the stomach. No pneumothorax. There is consolidation in the left lower lobe with left effusion. There is more patchy infiltrate in the right lower lobe. There is moderate interstitial edema. Heart is mildly enlarged with pulmonary venous hypertension. No adenopathy. There is atherosclerotic change in the aorta.  IMPRESSION:  Congestive heart failure. Question superimposed pneumonia left lower lobe. Tube and catheter positions as described without apparent pneumothorax.   Electronically Signed   By: Bretta Bang M.D.   On: 03/13/2014 14:14   Dg Chest Port 1 View  03/11/2014   CLINICAL DATA:  Shortness of breath  EXAM: PORTABLE CHEST - 1 VIEW  COMPARISON:  03/01/2014  FINDINGS: Left IJ dialysis catheter, tip at the level of the upper right atrium.  Stable mild cardiomegaly. Widening of the upper mediastinum related to apical lordotic positioning. Small to moderate bilateral pleural effusions. These are likely stable from prior given differences in patient positioning, with inferior flow of fluid on the right. Venous congestion with diffuse interstitial coarsening and cephalized blood flow, increased from previous.  IMPRESSION: 1. Small to moderate bilateral pleural effusions. 2. Pulmonary venous hypertension.   Electronically Signed   By: Tiburcio Pea M.D.   On: 03/11/2014 22:35   Dg Abd Portable 1v  03/19/2014   CLINICAL DATA:  Orogastric tube placement.  EXAM: PORTABLE ABDOMEN - 1 VIEW  COMPARISON:  03/13/2014.  FINDINGS: Normal bowel gas pattern. Orogastric tube tip in the mid stomach. Small bilateral pleural effusions and left basilar atelectasis. Mild scoliosis. Atheromatous arterial calcifications.  IMPRESSION: 1. Orogastric tube tip in the mid stomach. 2. Small bilateral pleural effusions. 3. Left lower lobe atelectasis.   Electronically Signed   By: Gordan Payment M.D.   On: 03/19/2014 03:49    Microbiology: Recent Results (from the past 240 hour(s))  CULTURE, BLOOD (ROUTINE X 2)     Status: None   Collection Time    04/05/14 11:00 AM      Result Value Ref Range Status   Specimen Description BLOOD LEFT ANTECUBITAL   Final   Special Requests BOTTLES DRAWN AEROBIC ONLY 10CC   Final   Culture  Setup Time     Final   Value: 04/05/2014 17:19     Performed at [facility]   Culture     Final   Value:         BLOOD CULTURE RECEIVED NO GROWTH TO DATE CULTURE WILL BE HELD FOR 5 DAYS BEFORE ISSUING A FINAL NEGATIVE REPORT     Performed at [facility]   Report Status PENDING   Incomplete  CULTURE, BLOOD (ROUTINE X 2)     Status: None   Collection Time    04/05/14 11:05 AM      Result Value Ref Range Status   Specimen Description BLOOD LEFT HAND   Final   Special Requests BOTTLES DRAWN AEROBIC ONLY 5CC   Final   Culture  Setup Time     Final   Value: 04/05/2014 17:19     Performed at [facility]   Culture     Final   Value:        BLOOD CULTURE RECEIVED NO GROWTH TO DATE CULTURE WILL BE HELD FOR 5 DAYS BEFORE  ISSUING A FINAL NEGATIVE REPORT     Performed at [facility]   Report Status PENDING   Incomplete  URINE CULTURE     Status: None   Collection Time    04/05/14  1:54 PM      Result Value Ref Range Status   Specimen Description URINE, RANDOM   Final   Special Requests NONE   Final   Culture  Setup Time     Final   Value: 04/05/2014 18:56     Performed at [facility] Count     Final   Value: >=100,000 COLONIES/ML     Performed at [facility]   Culture     Final   Value: ESCHERICHIA COLI     Performed at [facility]   Report Status 04/07/2014 FINAL   Final   Organism ID, Bacteria ESCHERICHIA COLI   Final  MRSA PCR SCREENING     Status: None   Collection Time    04/05/14  4:01 PM      Result Value Ref Range Status   MRSA by PCR NEGATIVE  NEGATIVE Final   Comment:            The GeneXpert MRSA Assay (FDA     approved for NASAL specimens     only), is one component of a     comprehensive MRSA colonization     surveillance program. It is not     intended to diagnose MRSA     infection nor to guide or     monitor treatment for     MRSA infections.     Labs: Basic Metabolic Panel:  Recent Labs Lab 04/06/14 1127 04/07/14 0500 04/08/14 0329 04/09/14 0332 04/09/14 0900  NA 135* 129* 132* 127* 129*  K 4.2 4.4  4.6 7.6* 5.3  CL 95* 90* 94* 91* 91*  CO2 28 25 25 19 24   GLUCOSE 278* 290* 231* 183* 292*  BUN 29* 36* 30* 63* 69*  CREATININE 2.07* 1.99* 1.58* 2.58* 2.72*  CALCIUM 9.2 8.8 8.9 9.0 9.2  MG  --  1.6 1.7 1.6  --    Liver Function Tests:  Recent Labs Lab 04/05/14 1100 04/07/14 0500 04/08/14 0329 04/09/14 0332  AST 57* 20 34 71*  ALT 40* 28 36* 32  ALKPHOS 280* 239* 236* 216*  BILITOT 0.3 <0.2* <0.2* <0.2*  PROT 6.8 6.8 7.1 7.1  ALBUMIN 3.1* 3.1* 3.3* 3.1*   No results found for this basename: LIPASE, AMYLASE,  in the last 168 hours No results found for this basename: AMMONIA,  in the last 168 hours CBC:  Recent Labs Lab 04/05/14 1100 04/05/14 1417 04/07/14 0500 04/08/14 0329 04/09/14 0332  WBC 17.9* 17.0* 19.9* 16.6* 14.2*  NEUTROABS 15.5*  --  18.9* 15.9* 12.7*  HGB 10.8* 12.0 10.8* 11.9* 13.0  HCT 33.7* 37.0 34.3* 37.7 40.9  MCV 93.4 93.2 94.8 93.3 93.8  PLT 275 259 288 274 PLATELETS APPEAR ADEQUATE   Cardiac Enzymes: No results found for this basename: CKTOTAL, CKMB, CKMBINDEX, TROPONINI,  in the last 168 hours BNP: BNP (last 3 results)  Recent Labs  10/26/13 0510 11/07/13 0455 02/24/14 0900  PROBNP 9552.0* 22434.0* 32267.0*   CBG:  Recent Labs Lab 04/08/14 0833 04/08/14 1006 04/08/14 1229 04/08/14 1738 04/08/14 2135  GLUCAP 240* 275* 304* 161* 78       Signed:  Carolyne Littles, MD [facility] [phone] pager

## 2014-04-09 NOTE — Clinical Social Work Note (Signed)
CSW continues to follow this patient for d/c plan. CSW made aware by MD patient is ready for d/c to [facility]. CSW contacted facility and confirmed patient's return. CSW met with patient who is agreeable to d/c plan. Patient stated she has already made her son aware of d/c. CSW faxed d/c summary to facility and placed d/c packet in patient's shadow chart. RN made aware of room and report number. CSW to arrange transportation via [facility]. No further needs. CSW signing off.   Crystal Patrick-Jefferson, LCSWA Weekend Clinical Social Worker [phone]  

## 2014-04-09 NOTE — Progress Notes (Signed)
Nurse contacted [place] facility and gave report to [facility].  Clinical Social Worker will arrange for transport and required paperwork.  Pt will be transferred to facility (room 105).  Pt is alert and oriented at time of discharge without questions or concerns.  Pt accepted discharge education.  Pt belongings will be transported with pt.

## 2014-04-09 NOTE — Progress Notes (Signed)
O2 weaned to 4LPM [place] per sats of 100%. Pt tolerating well at this time. SpO2 remains around 96-97%. RT will continue to monitor.

## 2014-04-11 LAB — CULTURE, BLOOD (ROUTINE X 2)
CULTURE: NO GROWTH
CULTURE: NO GROWTH

## 2014-04-17 ENCOUNTER — Encounter: Payer: Medicare Other | Admitting: Physician Assistant

## 2014-04-18 ENCOUNTER — Encounter: Payer: Medicare Other | Admitting: Physician Assistant

## 2014-04-18 NOTE — Progress Notes (Signed)
This encounter was created in error - please disregard.

## 2014-04-18 NOTE — Progress Notes (Deleted)
Cardiology Office Note   Date:  04/18/2014   ID:  [name] [name], DOB 06/25/1941, MRN [id number]  PCP:  [name],[name], MD  Cardiologist:  Dr. Nanetta BattyJonathan Berry   Electrophysiologist:  Dr. Hillis RangeJames Allred    History of Present Illness: [name] [name] is a 73 y.o. female with a history of CAD with a chronically occluded RCA, diabetes, HTN, HL, ESRD on hemodialysis, prior DVT and pulmonary embolism, prior UGI bleed.     Studies:  - LHC (02/2012):  Distal left main 20%, proximal LAD 40%/40%, distal LAD 99% at apex, ostial OM1 90% (1 mm-too small for PCI), proximal RCA 90%, mid RCA chronic, subtotal occlusion (antegrade flow + left-right collaterals).  PCI: Unsuccessful attempt at balloon angioplasty of the chronic subtotally occluded RCA.  - Echo (03/15/14):  Inferior hypokinesis, EF 50%, grade 1 diastolic dysfunction, probable RV dysfunction  - Nuclear (07/2013):  Low risk, no ischemia, EF 48% ([facility])  - Carotid US (02/2014):  R 1-39%,  L ? Velocities (no evidence of stenosis >80%)  - Peripheral Angiogram (07/2013):  high-grade calcified diffuse bilateral common and external iliac artery stenoses with occluded SFAs and infrapopliteal disease (Dr. Allyson SabalBerry) - no intervention performed  Recent Labs: 02/24/2014: Pro B Natriuretic peptide (BNP) 32267.0*  02/28/2014: HDL Cholesterol by NMR 59; LDL (calc) 37  04/07/2014: TSH 1.810  04/09/2014: ALT 32; Creatinine 2.72*; Hemoglobin 13.0; Potassium 5.3   Wt Readings from Last 3 Encounters:  04/07/14 135 lb 9.3 oz (61.5 kg)  03/23/14 134 lb 14.7 oz (61.2 kg)  03/04/14 141 lb 12.1 oz (64.3 kg)     Past Medical History  Diagnosis Date  . Hypothyroidism   . Colitis 10/2011    C diff.   . Coronary artery disease 3/13    3V CAD at cath, failed RCA PCI  . Hypertension   . GERD (gastroesophageal reflux disease)   . Hx of pulmonary embolus   . COPD (chronic obstructive pulmonary disease)     Nocturnal O2.   . Shortness of breath     uses O2 as needed  .  Critical lower limb ischemia   . DVT (deep venous thrombosis) 2008    "right"  . Pneumonia     "several times"  . CHF (congestive heart failure)     Volume mgt with HD  . Hypercholesteremia   . GI bleed March 2015  . ESRD on hemodialysis     Started HD in December 2012.  Cause of ESRD unknown.  [place] HD in [place] MWF schedule. She had a right upper arm access which failed just short of one year.  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  She says she has "poor circulation" in the legs so they won't do a leg access. She remains catheter dependent as of   . On home oxygen therapy     "3-4L; 24/7" (04/05/2014)  . Myocardial infarction ? date; 03/13/2014  . Anemia   . History of blood transfusion     "twice" (04/05/2014)  . PEA (Pulseless electrical activity) 03/13/2014    [name]/notes 03/13/2014  . Peripheral vascular disease     claudication.   . Type II diabetes mellitus     insulin requiring.     Current Outpatient Prescriptions  Medication Sig Dispense Refill  . albuterol (PROAIR HFA) 108 (90 BASE) MCG/ACT inhaler Inhale 2 puffs into the lungs every 4 (  four) hours as needed for wheezing or shortness of breath.      Marland Kitchen albuterol (PROVENTIL) (2.5 MG/3ML) 0.083% nebulizer solution Take 2.5 mg by nebulization every 6 (six) hours as needed for wheezing or shortness of breath.      Marland Kitchen aspirin EC 81 MG tablet Take 81 mg by mouth daily.      . budesonide (PULMICORT) 0.25 MG/2ML nebulizer solution Take 2 mLs (0.25 mg total) by nebulization 2 (two) times daily.  60 mL  12  . calcium acetate (PHOSLO) 667 MG capsule Take 667 mg by mouth 3 (three) times daily with meals.      . cefUROXime (CEFTIN) 500 MG tablet Take 1 tablet (500 mg total) by mouth 2 (two) times daily with a meal.  10 tablet  0  . cyclobenzaprine (FLEXERIL) 10 MG tablet Take 1 tablet (10 mg total) by mouth 3 (three) times daily as needed for muscle spasms.  30  tablet  0  . dextromethorphan-guaiFENesin (MUCINEX DM) 30-600 MG per 12 hr tablet Take 1 tablet by mouth 2 (two) times daily.  30 tablet  0  . insulin aspart (NOVOLOG) 100 UNIT/ML injection Inject 10 Units into the skin 3 (three) times daily with meals.  10 mL  1  . insulin aspart (NOVOLOG) 100 UNIT/ML injection Inject 0-15 Units into the skin every 4 (four) hours.  10 mL  11  . insulin glargine (LANTUS) 100 UNIT/ML injection Inject 0.2 mLs (20 Units total) into the skin at bedtime.  10 mL  1  . levothyroxine (SYNTHROID, LEVOTHROID) 25 MCG tablet Take 25 mcg by mouth daily.       . methylPREDNISolone sodium succinate (SOLU-MEDROL) 125 mg/2 mL injection Inject 0.96 mLs (60 mg total) into the vein daily.  4 each  0  . midodrine (PROAMATINE) 5 MG tablet Take 5 mg tablet Monday, Wednesday, Friday,  30 tablet  0  . multivitamin (RENA-VIT) TABS tablet Take 1 tablet by mouth daily.      . mupirocin cream (BACTROBAN) 2 % Apply topically daily. To left heel  15 g  0  . nitroGLYCERIN (NITROSTAT) 0.4 MG SL tablet Place 0.4 mg under the tongue every 5 (five) minutes as needed for chest pain. For chest pain.      . Nutritional Supplements (FEEDING SUPPLEMENT, NEPRO CARB STEADY,) LIQD Take 237 mLs by mouth 2 (two) times daily between meals.    0  . pantoprazole (PROTONIX) 40 MG tablet Take 40 mg by mouth daily.       . Probiotic Product (PROBIOTIC PO) Take 1 capsule by mouth daily as needed (digestion).       . simvastatin (ZOCOR) 80 MG tablet Take 1 tablet (80 mg total) by mouth every evening.  30 tablet  0  . tiotropium (SPIRIVA) 18 MCG inhalation capsule Place 1 capsule (18 mcg total) into inhaler and inhale daily.  30 capsule  12  . [DISCONTINUED] enoxaparin (LOVENOX) 60 MG/0.6ML SOLN Inject 0.55 mLs (55 mg total) into the skin daily.       No current facility-administered medications for this visit.    Allergies:   Procaine hcl   Social History:  The patient  reports that she quit smoking about 2 years  ago. Her smoking use included Cigarettes. She has a 40 pack-year smoking history. She has never used smokeless tobacco. She reports that she does not drink alcohol or use illicit drugs.   Family History:  The patient's family history includes Diabetes in her father  and mother; Diabetes type II in an other family member; Hypertension in an other family member; Kidney disease in her mother. There is no history of Anesthesia problems.   ROS:  Please see the history of present illness.   ***   All other systems reviewed and negative.   PHYSICAL EXAM: VS:  There were no vitals taken for this visit. Well nourished, well developed, in no acute distress HEENT: normal Neck: ***no JVD Cardiac:  normal S1, S2; ***RRR; no murmur Lungs:  ***clear to auscultation bilaterally, no wheezing, rhonchi or rales Abd: soft, nontender, no hepatomegaly Ext: ***no edema Skin: warm and dry Neuro:  CNs 2-12 intact, no focal abnormalities noted  EKG:  ***     ASSESSMENT AND PLAN:  No diagnosis found. ***   Signed, Brynda Rim, MHS 04/18/2014 10:17 AM    [facility] [address] [place], [place], [place]  [address] Phone: [phone]; Fax: [phone]

## 2014-04-25 NOTE — Telephone Encounter (Signed)
x

## 2014-04-26 ENCOUNTER — Encounter: Payer: Self-pay | Admitting: Physician Assistant

## 2014-05-04 ENCOUNTER — Institutional Professional Consult (permissible substitution): Payer: Medicare Other | Admitting: Pulmonary Disease

## 2014-05-08 ENCOUNTER — Emergency Department (HOSPITAL_COMMUNITY): Payer: Medicare Other

## 2014-05-08 ENCOUNTER — Emergency Department (HOSPITAL_COMMUNITY)
Admission: EM | Admit: 2014-05-08 | Discharge: 2014-05-08 | Disposition: A | Discharge status: Home / Self Care | Payer: Medicare Other | Attending: Emergency Medicine | Admitting: Emergency Medicine

## 2014-05-08 ENCOUNTER — Encounter (HOSPITAL_COMMUNITY): Payer: Self-pay | Admitting: Emergency Medicine

## 2014-05-08 DIAGNOSIS — Z8701 Personal history of pneumonia (recurrent): Secondary | ICD-10-CM | POA: Insufficient documentation

## 2014-05-08 DIAGNOSIS — H6122 Impacted cerumen, left ear: Secondary | ICD-10-CM

## 2014-05-08 DIAGNOSIS — Z9981 Dependence on supplemental oxygen: Secondary | ICD-10-CM | POA: Insufficient documentation

## 2014-05-08 DIAGNOSIS — I2510 Atherosclerotic heart disease of native coronary artery without angina pectoris: Secondary | ICD-10-CM | POA: Insufficient documentation

## 2014-05-08 DIAGNOSIS — I509 Heart failure, unspecified: Secondary | ICD-10-CM | POA: Insufficient documentation

## 2014-05-08 DIAGNOSIS — Z86711 Personal history of pulmonary embolism: Secondary | ICD-10-CM | POA: Insufficient documentation

## 2014-05-08 DIAGNOSIS — Z9889 Other specified postprocedural states: Secondary | ICD-10-CM | POA: Insufficient documentation

## 2014-05-08 DIAGNOSIS — H6120 Impacted cerumen, unspecified ear: Secondary | ICD-10-CM | POA: Insufficient documentation

## 2014-05-08 DIAGNOSIS — I120 Hypertensive chronic kidney disease with stage 5 chronic kidney disease or end stage renal disease: Secondary | ICD-10-CM | POA: Insufficient documentation

## 2014-05-08 DIAGNOSIS — Z8674 Personal history of sudden cardiac arrest: Secondary | ICD-10-CM | POA: Insufficient documentation

## 2014-05-08 DIAGNOSIS — E039 Hypothyroidism, unspecified: Secondary | ICD-10-CM | POA: Insufficient documentation

## 2014-05-08 DIAGNOSIS — Z8619 Personal history of other infectious and parasitic diseases: Secondary | ICD-10-CM | POA: Insufficient documentation

## 2014-05-08 DIAGNOSIS — I252 Old myocardial infarction: Secondary | ICD-10-CM | POA: Insufficient documentation

## 2014-05-08 DIAGNOSIS — N186 End stage renal disease: Secondary | ICD-10-CM | POA: Insufficient documentation

## 2014-05-08 DIAGNOSIS — I9589 Other hypotension: Secondary | ICD-10-CM | POA: Insufficient documentation

## 2014-05-08 DIAGNOSIS — Z862 Personal history of diseases of the blood and blood-forming organs and certain disorders involving the immune mechanism: Secondary | ICD-10-CM | POA: Insufficient documentation

## 2014-05-08 DIAGNOSIS — Z792 Long term (current) use of antibiotics: Secondary | ICD-10-CM | POA: Insufficient documentation

## 2014-05-08 DIAGNOSIS — Z79899 Other long term (current) drug therapy: Secondary | ICD-10-CM | POA: Insufficient documentation

## 2014-05-08 DIAGNOSIS — Z87891 Personal history of nicotine dependence: Secondary | ICD-10-CM | POA: Insufficient documentation

## 2014-05-08 DIAGNOSIS — E119 Type 2 diabetes mellitus without complications: Secondary | ICD-10-CM | POA: Insufficient documentation

## 2014-05-08 DIAGNOSIS — Z86718 Personal history of other venous thrombosis and embolism: Secondary | ICD-10-CM | POA: Insufficient documentation

## 2014-05-08 DIAGNOSIS — K219 Gastro-esophageal reflux disease without esophagitis: Secondary | ICD-10-CM | POA: Insufficient documentation

## 2014-05-08 DIAGNOSIS — E7800 Pure hypercholesterolemia, unspecified: Secondary | ICD-10-CM | POA: Insufficient documentation

## 2014-05-08 DIAGNOSIS — Z992 Dependence on renal dialysis: Secondary | ICD-10-CM | POA: Insufficient documentation

## 2014-05-08 DIAGNOSIS — Z794 Long term (current) use of insulin: Secondary | ICD-10-CM | POA: Insufficient documentation

## 2014-05-08 DIAGNOSIS — IMO0002 Reserved for concepts with insufficient information to code with codable children: Secondary | ICD-10-CM | POA: Insufficient documentation

## 2014-05-08 DIAGNOSIS — Z7982 Long term (current) use of aspirin: Secondary | ICD-10-CM | POA: Insufficient documentation

## 2014-05-08 LAB — I-STAT CHEM 8, ED
BUN: 27 mg/dL — ABNORMAL HIGH (ref 6–23)
CHLORIDE: 101 meq/L (ref 96–112)
Calcium, Ion: 1.11 mmol/L — ABNORMAL LOW (ref 1.13–1.30)
Creatinine, Ser: 2.5 mg/dL — ABNORMAL HIGH (ref 0.50–1.10)
Glucose, Bld: 162 mg/dL — ABNORMAL HIGH (ref 70–99)
HCT: 43 % (ref 36.0–46.0)
Hemoglobin: 14.6 g/dL (ref 12.0–15.0)
Potassium: 4.9 mEq/L (ref 3.7–5.3)
SODIUM: 134 meq/L — AB (ref 137–147)
TCO2: 30 mmol/L (ref 0–100)

## 2014-05-08 LAB — BASIC METABOLIC PANEL
Anion gap: 16 — ABNORMAL HIGH (ref 5–15)
BUN: 22 mg/dL (ref 6–23)
CHLORIDE: 97 meq/L (ref 96–112)
CO2: 24 mEq/L (ref 19–32)
Calcium: 9.3 mg/dL (ref 8.4–10.5)
Creatinine, Ser: 2.32 mg/dL — ABNORMAL HIGH (ref 0.50–1.10)
GFR calc Af Amer: 23 mL/min — ABNORMAL LOW (ref >90)
GFR, EST NON AFRICAN AMERICAN: 20 mL/min — AB (ref >90)
GLUCOSE: 159 mg/dL — AB (ref 70–99)
Potassium: 5 mEq/L (ref 3.7–5.3)
SODIUM: 137 meq/L (ref 137–147)

## 2014-05-08 LAB — CBC
HCT: 37.3 % (ref 36.0–46.0)
HEMOGLOBIN: 11.4 g/dL — AB (ref 12.0–15.0)
MCH: 29.3 pg (ref 26.0–34.0)
MCHC: 30.6 g/dL (ref 30.0–36.0)
MCV: 95.9 fL (ref 78.0–100.0)
PLATELETS: 256 10*3/uL (ref 150–400)
RBC: 3.89 MIL/uL (ref 3.87–5.11)
RDW: 17.7 % — ABNORMAL HIGH (ref 11.5–15.5)
WBC: 9.9 10*3/uL (ref 4.0–10.5)

## 2014-05-08 LAB — MAGNESIUM: MAGNESIUM: 1.9 mg/dL (ref 1.5–2.5)

## 2014-05-08 LAB — URINALYSIS, ROUTINE W REFLEX MICROSCOPIC
Bilirubin Urine: NEGATIVE
Glucose, UA: NEGATIVE mg/dL
Hgb urine dipstick: NEGATIVE
Ketones, ur: NEGATIVE mg/dL
Nitrite: NEGATIVE
PROTEIN: 100 mg/dL — AB
Specific Gravity, Urine: 1.015 (ref 1.005–1.030)
Urobilinogen, UA: 0.2 mg/dL (ref 0.0–1.0)
pH: 5 (ref 5.0–8.0)

## 2014-05-08 LAB — URINE MICROSCOPIC-ADD ON

## 2014-05-08 LAB — PRO B NATRIURETIC PEPTIDE: Pro B Natriuretic peptide (BNP): 25374 pg/mL — ABNORMAL HIGH (ref 0–125)

## 2014-05-08 LAB — TROPONIN I: Troponin I: <0.3 ng/mL (ref <0.30)

## 2014-05-08 MED ORDER — DOCUSATE SODIUM 50 MG/5ML PO LIQD
50.0000 mg | Freq: Once | ORAL | Status: AC
  Administered 2014-05-08: 50 mg via ORAL
  Filled 2014-05-08: qty 10

## 2014-05-08 NOTE — ED Notes (Signed)
This writer attempting to remove cerumen from left ear. Able to remove small pieces after colace absorbed and irrigated with hydrogen peroxide.

## 2014-05-08 NOTE — ED Notes (Signed)
In and out cath not needed. Pt urinated in bed pan

## 2014-05-08 NOTE — ED Notes (Signed)
MD at bedside. 

## 2014-05-08 NOTE — ED Notes (Signed)
IV attempt x2 unsuccessful. Phlebotomy at the bedside for blood draw.

## 2014-05-08 NOTE — Discharge Instructions (Signed)
Cerumen Impaction A cerumen impaction is when the wax in your ear forms a plug. This plug usually causes reduced hearing. Sometimes it also causes an earache or dizziness. Removing a cerumen impaction can be difficult and painful. The wax sticks to the ear canal. The canal is sensitive and bleeds easily. If you try to remove a heavy wax buildup with a cotton tipped swab, you may push it in further. Irrigation with water, suction, and small ear curettes may be used to clear out the wax. If the impaction is fixed to the skin in the ear canal, ear drops may be needed for a few days to loosen the wax. People who build up a lot of wax frequently can use ear wax removal products available in your local drugstore. SEEK MEDICAL CARE IF:  You develop an earache, increased hearing loss, or marked dizziness. Document Released: 11/27/2004 Document Revised: 01/12/2012 Document Reviewed: 01/17/2010 [facility] Patient Information 2015 [place]. This information is not intended to replace advice given to you by your health care provider. Make sure you discuss any questions you have with your health care provider.  Hypotension As your heart beats, it forces blood through your arteries. This force is your blood pressure. If your blood pressure is too low for you to go about your normal activities or to support the organs of your body, you have hypotension. Hypotension is also referred to as low blood pressure. When your blood pressure becomes too low, you may not get enough blood to your brain. As a result, you may feel weak, feel lightheaded, or develop a rapid heart rate. In a more severe case, you may faint. CAUSES Various conditions can cause hypotension. These include:  Blood loss.  Dehydration.  Heart or endocrine problems.  Pregnancy.  Severe infection.  Not having a well-balanced diet filled with needed nutrients.  Severe allergic reactions (anaphylaxis). Some medicines, such as blood pressure  medicine or water pills (diuretics), may lower your blood pressure below normal. Sometimes taking too much medicine or taking medicine not as directed can cause hypotension. TREATMENT  Hospitalization is sometimes required for hypotension if fluid or blood replacement is needed, if time is needed for medicines to wear off, or if further monitoring is needed. Treatment might include changing your diet, changing your medicines (including medicines aimed at raising your blood pressure), and use of support stockings. HOME CARE INSTRUCTIONS   Drink enough fluids to keep your urine clear or pale yellow.  Take your medicines as directed by your health care provider.  Get up slowly from reclining or sitting positions. This gives your blood pressure a chance to adjust.  Wear support stockings as directed by your health care provider.  Maintain a healthy diet by including nutritious food, such as fruits, vegetables, nuts, whole grains, and lean meats. SEEK MEDICAL CARE IF:  You have vomiting or diarrhea.  You have a fever for more than 2-3 days.  You feel more thirsty than usual.  You feel weak and tired. SEEK IMMEDIATE MEDICAL CARE IF:   You have chest pain or a fast or irregular heartbeat.  You have a loss of feeling in some part of your body, or you lose movement in your arms or legs.  You have trouble speaking.  You become sweaty or feel lightheaded.  You faint. MAKE SURE YOU:   Understand these instructions.  Will watch your condition.  Will get help right away if you are not doing well or get worse. Document Released: 10/20/2005  Document Revised: 08/10/2013 Document Reviewed: 04/22/2013 [facility] Patient Information 2015 [place], [place]. This information is not intended to replace advice given to you by your health care provider. Make sure you discuss any questions you have with your health care provider.

## 2014-05-08 NOTE — ED Notes (Signed)
Per EMS- pt was at dialysis when she had a period of unresponsiveness and was reported to have a Bp of 50-60 systolic. Pt states that she was sleeping. Pt was given fluids and placed in trendelenburg at dialysis. Pt was 2.5hrs into her dialysis treatment and had had 1L removed of the 4L there were suppose to be removed. Pt was responsive when EMS arrived and was 150 palpated Bp with EMS. Pt denies any pain. Alert and oriented upon arrival

## 2014-05-08 NOTE — ED Notes (Signed)
This writer back at the bedside. More colace placed in the left ear at this time.

## 2014-05-08 NOTE — ED Provider Notes (Addendum)
TIME SEEN: 9:35 AM  CHIEF COMPLAINT: Syncope  HPI: Patient is a 73 year old female with history of CAD, hypertension, COPD, DVT, CHF, end-stage renal disease on hemodialysis who had a syncopal event during dialysis today. Per EMS, patient was approximately 2-1/2 hours into her dialysis treatment and had a syncopal event. Her blood pressure at that time was in the 60 systolic. There was no known seizure activity. Patient states that she did not pass out but that she fell asleep. She denies having any symptoms with this episode. She is now back to her baseline. Her blood pressure currently is 148/59. She has no acute complaints except for left ear pain which has been present for the past 2 weeks. She denies any chest pain or shortness of breath, headache, numbness or focal weakness, vomiting or diarrhea, bloody stools or melena. She still makes urine several times a day. No dysuria or hematuria. Patient lives at home with her son.  ROS: See HPI Constitutional: no fever  Eyes: no drainage  ENT: no runny nose   Cardiovascular:  no chest pain  Resp: no SOB  GI: no vomiting GU: no dysuria Integumentary: no rash  Allergy: no hives  Musculoskeletal: no leg swelling  Neurological: no slurred speech ROS otherwise negative  PAST MEDICAL HISTORY/PAST SURGICAL HISTORY:  Past Medical History  Diagnosis Date  . Hypothyroidism   . Colitis 10/2011    C diff.   . Coronary artery disease 3/13    3V CAD at cath, failed RCA PCI  . Hypertension   . GERD (gastroesophageal reflux disease)   . Hx of pulmonary embolus   . COPD (chronic obstructive pulmonary disease)     Nocturnal O2.   . Shortness of breath     uses O2 as needed  . Critical lower limb ischemia   . DVT (deep venous thrombosis) 2008    "right"  . Pneumonia     "several times"  . CHF (congestive heart failure)     Volume mgt with HD  . Hypercholesteremia   . GI bleed March 2015  . ESRD on hemodialysis     Started HD in December 2012.   Cause of ESRD unknown.  Gets HD in [place] on MWF schedule. She had a right upper arm access which failed just short of one year.  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  She says she has "poor circulation" in the legs so they won't do a leg access. She remains catheter dependent as of   . On home oxygen therapy     "3-4L; 24/7" (04/05/2014)  . Myocardial infarction ? date; 03/13/2014  . Anemia   . History of blood transfusion     "twice" (04/05/2014)  . PEA (Pulseless electrical activity) 03/13/2014    [name] 03/13/2014  . Peripheral vascular disease     claudication.   . Type II diabetes mellitus     insulin requiring.     MEDICATIONS:  Prior to Admission medications   Medication Sig Start Date End Date Taking? Authorizing Provider  albuterol (PROAIR HFA) 108 (90 BASE) MCG/ACT inhaler Inhale 2 puffs into the lungs every 4 (four) hours as needed for wheezing or shortness of breath.    Historical Provider, MD  albuterol (PROVENTIL) (2.5 MG/3ML) 0.083% nebulizer solution Take 2.5 mg by nebulization every 6 (six) hours as needed for wheezing or shortness of breath.    Historical  Provider, MD  aspirin EC 81 MG tablet Take 81 mg by mouth daily.    Historical Provider, MD  budesonide (PULMICORT) 0.25 MG/2ML nebulizer solution Take 2 mLs (0.25 mg total) by nebulization 2 (two) times daily. 03/23/14   Ripudeep Jenna LuoK Rai, MD  calcium acetate (PHOSLO) 667 MG capsule Take 667 mg by mouth 3 (three) times daily with meals.    Historical Provider, MD  cefUROXime (CEFTIN) 500 MG tablet Take 1 tablet (500 mg total) by mouth 2 (two) times daily with a meal. 04/09/14   Drema Dallasurtis J Woods, MD  cyclobenzaprine (FLEXERIL) 10 MG tablet Take 1 tablet (10 mg total) by mouth 3 (three) times daily as needed for muscle spasms. 03/01/14   Esperanza SheetsUlugbek N Buriev, MD  dextromethorphan-guaiFENesin (MUCINEX DM) 30-600 MG per 12 hr tablet Take 1 tablet by mouth 2  (two) times daily. 04/09/14   Drema Dallasurtis J Woods, MD  insulin aspart (NOVOLOG) 100 UNIT/ML injection Inject 10 Units into the skin 3 (three) times daily with meals. 04/09/14   Drema Dallasurtis J Woods, MD  insulin aspart (NOVOLOG) 100 UNIT/ML injection Inject 0-15 Units into the skin every 4 (four) hours. 04/09/14   Drema Dallasurtis J Woods, MD  insulin glargine (LANTUS) 100 UNIT/ML injection Inject 0.2 mLs (20 Units total) into the skin at bedtime. 04/09/14   Drema Dallasurtis J Woods, MD  levothyroxine (SYNTHROID, LEVOTHROID) 25 MCG tablet Take 25 mcg by mouth daily.     Historical Provider, MD  methylPREDNISolone sodium succinate (SOLU-MEDROL) 125 mg/2 mL injection Inject 0.96 mLs (60 mg total) into the vein daily. 04/09/14   Drema Dallasurtis J Woods, MD  midodrine (PROAMATINE) 5 MG tablet Take 5 mg tablet Monday, Wednesday, Friday, 04/09/14   Drema Dallasurtis J Woods, MD  multivitamin (RENA-VIT) TABS tablet Take 1 tablet by mouth daily.    Historical Provider, MD  mupirocin cream (BACTROBAN) 2 % Apply topically daily. To left heel 03/23/14   Ripudeep Jenna LuoK Rai, MD  nitroGLYCERIN (NITROSTAT) 0.4 MG SL tablet Place 0.4 mg under the tongue every 5 (five) minutes as needed for chest pain. For chest pain.    Historical Provider, MD  Nutritional Supplements (FEEDING SUPPLEMENT, NEPRO CARB STEADY,) LIQD Take 237 mLs by mouth 2 (two) times daily between meals. 03/23/14   Ripudeep Jenna LuoK Rai, MD  pantoprazole (PROTONIX) 40 MG tablet Take 40 mg by mouth daily.     Historical Provider, MD  Probiotic Product (PROBIOTIC PO) Take 1 capsule by mouth daily as needed (digestion).     Historical Provider, MD  simvastatin (ZOCOR) 80 MG tablet Take 1 tablet (80 mg total) by mouth every evening. 01/08/14   Hollice EspySendil K Krishnan, MD  tiotropium (SPIRIVA) 18 MCG inhalation capsule Place 1 capsule (18 mcg total) into inhaler and inhale daily. 11/13/13   Rhetta MuraJai-Gurmukh Samtani, MD    ALLERGIES:  Allergies  Allergen Reactions  . Procaine Hcl Nausea And Vomiting and Other (See Comments)     [facility] has reaction of "comatose"    SOCIAL HISTORY:  History  Substance Use Topics  . Smoking status: Former Smoker -- 1.00 packs/day for 40 years    Types: Cigarettes    Quit date: 06/11/2011  . Smokeless tobacco: Never Used  . Alcohol Use: No    FAMILY HISTORY: Family History  Problem Relation Age of Onset  . Diabetes type II    . Hypertension    . Diabetes Mother   . Kidney disease Mother   . Diabetes Father   . Anesthesia problems Neg Hx  EXAM: BP 148/59  Temp(Src) 98.3 F (36.8 C) (Oral)  Resp 20  SpO2 97% CONSTITUTIONAL: Alert and oriented x 3 and responds appropriately to questions. Well-appearing; well-nourished, pleasant, smiling, in no distress HEAD: Normocephalic EYES: Conjunctivae clear, PERRL ENT: normal nose; no rhinorrhea; moist mucous membranes; pharynx without lesions noted, left TM is up secured and the canal is impacted with cerumen  NECK: Supple, no meningismus, no LAD  CARD: RRR; S1 and S2 appreciated; no murmurs, no clicks, no rubs, no gallops RESP: Normal chest excursion without splinting or tachypnea; breath sounds clear and equal bilaterally; no wheezes, no rhonchi, no rales,  ABD/GI: Normal bowel sounds; non-distended; soft, non-tender, no rebound, no guarding BACK:  The back appears normal and is non-tender to palpation, there is no CVA tenderness EXT: Normal ROM in all joints; non-tender to palpation; no edema; normal capillary refill; no cyanosis    SKIN: Normal color for age and race; warm NEURO: Moves all extremities equally, sensation to light touch intact diffusely, cranial nerves II through XII intact, no pronator drift PSYCH: The patient's mood and manner are appropriate. Grooming and personal hygiene are appropriate.  MEDICAL DECISION MAKING: Patient here for syncopal event during dialysis. At that time her blood pressure was low but immediately resolved. Suspect vasovagal syncope. She states this has happened to  her several times in the past with dialysis. Her blood pressure is back to normal and she has no current complaints except for left ear pain which I feel is completely unrelated. We'll obtain labs including troponin, chest x-ray and urine. Will place Colace in her left ear and irrigate.  ED PROGRESS: Patient is still hemodynamically stable without complaints. Her labs are unremarkable. Troponin negative. Urine shows small leukocytes but also many squamous cells and bacteria. Suspect dirty catch. Chest x-ray shows very mild pulmonary edema, decreased left-sided pleural effusion. We'll repeat troponin.  Per Dr. Arlean Hopping note 04/09/14, pt has had issues with hypotension during dialysis several times in the past and is now off of antihypertensives. She has had 2 episodes of cardiac arrest during hemodialysis. We'll discuss with nephrology but I feel patient can likely be discharged home.   1:21 PM  D/w Dr. Allena Katz with nephrology who agrees that patient can be discharged home of her second troponin is negative and she is still hemodynamically stable without complaints.     2:00 PM  Updated pt's son who is comfortable with plan and will stay with her for the next 24 hours. Second troponin pending.   4:24 PM  Second troponin negative.  Will discharge home with her son. Discussed return precautions and supportive care instructions. She verbalizes understanding and is comfortable with plan. Patient's left ear canal has been cleared using Colace and saline. No sign of otitis media or externa, mastoiditis. TMs are clear bilaterally.    EKG Interpretation  Date/Time:  Monday May 08 2014 09:28:36 EDT Ventricular Rate:  104 PR Interval:  142 QRS Duration: 101 QT Interval:  374 QTC Calculation: 492 R Axis:   64 Text Interpretation:  Sinus tachycardia Left atrial enlargement LVH with secondary repolarization abnormality Anterior Q waves, possibly due to LVH No significant change since last tracing Confirmed by  WARD,  DO, KRISTEN [phone]) on 05/08/2014 9:35:32 AM         Layla Maw Ward, DO 05/08/14 1420  Kristen N Ward, DO 05/08/14 1625

## 2014-05-08 NOTE — ED Notes (Signed)
Pt transported to xray 

## 2014-05-08 NOTE — ED Notes (Signed)
Allowing colace to sit in left ear canal to help loosen cerumen

## 2014-05-08 NOTE — ED Notes (Signed)
Dr. Ward at the bedside.  

## 2014-05-08 NOTE — ED Notes (Signed)
Attempted to remove cerumen from left ear. Unsuccessful. Made MD aware and asked her to look at ear. MD was able to remove a few large pieces of cerumen at this time.

## 2014-06-28 ENCOUNTER — Ambulatory Visit: Payer: Self-pay

## 2014-06-29 ENCOUNTER — Ambulatory Visit (INDEPENDENT_AMBULATORY_CARE_PROVIDER_SITE_OTHER): Payer: Medicare Other

## 2014-06-29 VITALS — BP 134/68 | HR 101 | Temp 97.2°F | Resp 18

## 2014-06-29 DIAGNOSIS — L97409 Non-pressure chronic ulcer of unspecified heel and midfoot with unspecified severity: Secondary | ICD-10-CM

## 2014-06-29 DIAGNOSIS — E1140 Type 2 diabetes mellitus with diabetic neuropathy, unspecified: Secondary | ICD-10-CM

## 2014-06-29 DIAGNOSIS — L97412 Non-pressure chronic ulcer of right heel and midfoot with fat layer exposed: Secondary | ICD-10-CM

## 2014-06-29 DIAGNOSIS — E1142 Type 2 diabetes mellitus with diabetic polyneuropathy: Secondary | ICD-10-CM

## 2014-06-29 DIAGNOSIS — B351 Tinea unguium: Secondary | ICD-10-CM

## 2014-06-29 DIAGNOSIS — M79609 Pain in unspecified limb: Secondary | ICD-10-CM

## 2014-06-29 DIAGNOSIS — I739 Peripheral vascular disease, unspecified: Secondary | ICD-10-CM

## 2014-06-29 DIAGNOSIS — M79676 Pain in unspecified toe(s): Secondary | ICD-10-CM

## 2014-06-29 DIAGNOSIS — E1149 Type 2 diabetes mellitus with other diabetic neurological complication: Secondary | ICD-10-CM

## 2014-06-29 NOTE — Patient Instructions (Signed)
Instructions for Wound Care  The most important step to healing a foot wound is to reduce the pressure on your foot - it is extremely important to stay off your foot as much as possible and wear the shoe/boot as instructed.  Cleanse your foot with saline wash or warm soapy water (dial antibacterial soap or similar).  Blot dry.  Apply prescribed medication to your wound and cover with gauze and a bandage.  May hold bandage in place with Coban (self sticky wrap), Ace bandage or tape.  You may find dressing supplies at your local [employer], [employer], drug store or medical supply store.  Your prescribed topical medication is :  Santyl topical enzymatic Continue daily cleansing of the wound site with soap and water as instructed and continue applying Santyl and gauze dressing either by patient or by home nursing maintain clean dressing changes daily on to resolve   If you notice any foul odor, increase in pain, pus, increased swelling, red streaks or generalized redness occurring in your foot or leg-Call our office immediately to be seen.  This may be a sign of a limb or life threatening infection that will need prompt attention.  Alvan Dame, [facility]  [facility]  [phone] [place]  [phone] [facility]

## 2014-06-29 NOTE — Progress Notes (Signed)
   Subjective:    Patient ID: [name] [name], female    DOB: 02-09-1941, 73 y.o.   MRN: [id number]  HPI I AM HERE TO GET MY TOENAILS TRIMMED UP AND I HAVE BEEN IN THE HOSPITAL IN AND OUT FOR DIFFERENT THINGS SINCE January OF THIS YEAR AND I HAVE A PLACE ON MY LEFT HEEL THAT HAS BEEN GOING ON FOR ABOUT TWO YEARS AND DRAINS AND I CAN'T FEEL ANYTHING AND I AM ON OXYGEN    Review of Systems  All other systems reviewed and are negative.      Objective:   Physical Exam 73 year old white female well-developed well-nourished oriented x3 appears to be stable at this time been a year since she was seen last presents with thick dystrophic nails need cutting also ulcer posterior inferior aspect of her left heel. Motion objective findings as follows masker status is grossly diminished on [facility] testing there is decreased absent epicritic sensation forefoot plantar foot arch and ankle dorsal foot has intact sensation as does the ankle patient's pedal pulses DP and PT thready DPM one over 4 PT nonpalpable bilateral Refill time 4-5 seconds epicritic sensations diminished on [facility] as mentioned there is no edema no signs of infection no ascending psoas lymphangitis nails thick brittle crumbly dystrophic friable with of proptosis and subungual debris there is a keratotic ulceration approximately 1 x 2 cm inferior and posterior left heel down to subcutaneous tissue level with the fibers center of area and necrotic and hyperkeratotic periphery report to mild serous drainage to been doing Santyl gauze dressings. No other open wounds no other ulcerations noted at this time. Digital contractures is status post amputation fifth ray right foot      Assessment & Plan:  Assessment diabetes with history of complications neuropathy and angiopathy. Previous partial amputations on the right at this time nails debrided x91 through 4 right one through 5 left painful deformed criptotic discolored and brittle nails  with onychomycosis in the presence of diabetes and complications also this time debridement hemorrhage a keratosis around the periphery the ulcer and some central necrotic tissue the ulcer inferior left heel Silvadene and gauze dressing applied continue with Santyl gauze dressings daily with palpation and home nursing followup in the future and as-needed basis consider at least 2 month followup for ulcer check and nail check in the future as needed  Alvan Dame DPM

## 2014-07-04 ENCOUNTER — Telehealth (HOSPITAL_COMMUNITY): Payer: Self-pay | Admitting: *Deleted

## 2014-07-08 IMAGING — CR DG CHEST 1V PORT
1 series · 1 of 1 positions shown · non-contrast
Comparison: 03/23/2014

CLINICAL DATA: Shortness of breath, dialysis

EXAM:
PORTABLE CHEST - 1 VIEW

[AP]
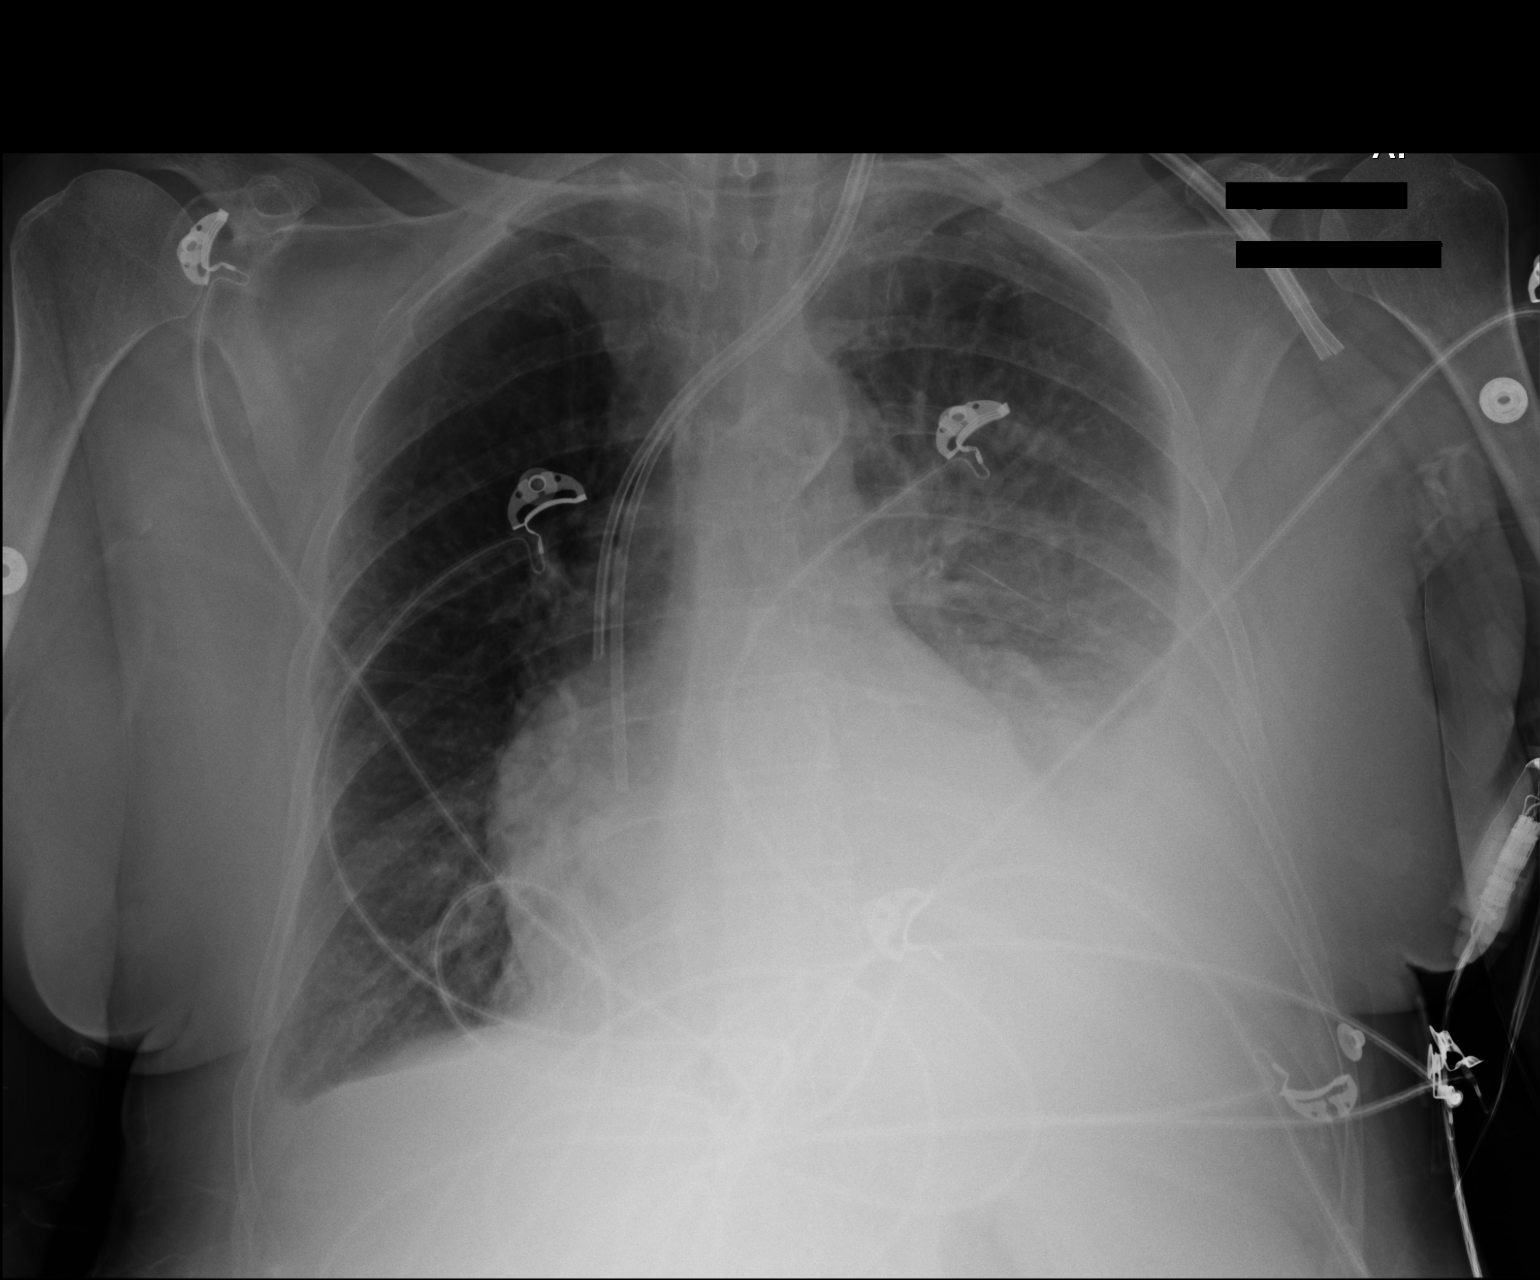

[1 of 1 positions shown; findings below may reference images not displayed]

FINDINGS: Moderate layering left pleural effusion, increased. No frank
interstitial edema. No pneumothorax.

Cardiomegaly.

Left IJ dual lumen dialysis catheter terminating in the upper right
atrium.
IMPRESSION: Moderate layering left pleural effusion, increased.

## 2014-08-31 ENCOUNTER — Ambulatory Visit: Payer: Medicare Other

## 2014-09-03 DEATH — deceased

## 2014-10-12 ENCOUNTER — Encounter (HOSPITAL_COMMUNITY): Payer: Self-pay | Admitting: Cardiovascular Disease
